# Patient Record
Sex: Male | Born: 1960 | Race: White | Hispanic: No | Marital: Single | Smoking: Former smoker
Health system: Southern US, Community
[De-identification: ages and names within clinical notes are randomized; demographics above are authoritative.]

## PROBLEM LIST (undated history)

## (undated) DIAGNOSIS — B20 Human immunodeficiency virus [HIV] disease: Secondary | ICD-10-CM

## (undated) DIAGNOSIS — Z8619 Personal history of other infectious and parasitic diseases: Secondary | ICD-10-CM

## (undated) DIAGNOSIS — B029 Zoster without complications: Secondary | ICD-10-CM

## (undated) DIAGNOSIS — M79609 Pain in unspecified limb: Secondary | ICD-10-CM

## (undated) DIAGNOSIS — Z862 Personal history of diseases of the blood and blood-forming organs and certain disorders involving the immune mechanism: Secondary | ICD-10-CM

## (undated) DIAGNOSIS — Z205 Contact with and (suspected) exposure to viral hepatitis: Secondary | ICD-10-CM

## (undated) DIAGNOSIS — Z8739 Personal history of other diseases of the musculoskeletal system and connective tissue: Secondary | ICD-10-CM

## (undated) DIAGNOSIS — L89159 Pressure ulcer of sacral region, unspecified stage: Secondary | ICD-10-CM

## (undated) DIAGNOSIS — IMO0001 Reserved for inherently not codable concepts without codable children: Secondary | ICD-10-CM

## (undated) DIAGNOSIS — Z21 Asymptomatic human immunodeficiency virus [HIV] infection status: Secondary | ICD-10-CM

## (undated) DIAGNOSIS — Z9289 Personal history of other medical treatment: Secondary | ICD-10-CM

## (undated) DIAGNOSIS — R112 Nausea with vomiting, unspecified: Secondary | ICD-10-CM

## (undated) DIAGNOSIS — Z9889 Other specified postprocedural states: Secondary | ICD-10-CM

## (undated) DIAGNOSIS — M1990 Unspecified osteoarthritis, unspecified site: Secondary | ICD-10-CM

## (undated) DIAGNOSIS — Z8679 Personal history of other diseases of the circulatory system: Secondary | ICD-10-CM

## (undated) DIAGNOSIS — C20 Malignant neoplasm of rectum: Secondary | ICD-10-CM

## (undated) DIAGNOSIS — Z87898 Personal history of other specified conditions: Secondary | ICD-10-CM

## (undated) DIAGNOSIS — IMO0002 Reserved for concepts with insufficient information to code with codable children: Secondary | ICD-10-CM

## (undated) HISTORY — DX: Malignant neoplasm of rectum: C20

## (undated) HISTORY — PX: HEMATOMA EVACUATION: SHX5118

## (undated) HISTORY — DX: Pain in unspecified limb: M79.609

## (undated) HISTORY — PX: HERNIA REPAIR: SHX51

## (undated) HISTORY — PX: COLONOSCOPY: SHX174

## (undated) HISTORY — PX: CYSTOSCOPY: SUR368

---

## 2002-11-19 ENCOUNTER — Encounter: Payer: Self-pay | Admitting: Infectious Diseases

## 2002-11-19 ENCOUNTER — Encounter: Admission: RE | Admit: 2002-11-19 | Discharge: 2002-11-19 | Payer: Self-pay | Admitting: Infectious Diseases

## 2002-11-19 ENCOUNTER — Ambulatory Visit (HOSPITAL_COMMUNITY): Admission: RE | Admit: 2002-11-19 | Discharge: 2002-11-19 | Payer: Self-pay | Admitting: Infectious Diseases

## 2002-12-03 ENCOUNTER — Encounter: Admission: RE | Admit: 2002-12-03 | Discharge: 2002-12-03 | Payer: Self-pay | Admitting: Infectious Diseases

## 2003-01-15 ENCOUNTER — Encounter: Admission: RE | Admit: 2003-01-15 | Discharge: 2003-01-15 | Payer: Self-pay | Admitting: Infectious Diseases

## 2003-01-15 ENCOUNTER — Encounter (INDEPENDENT_AMBULATORY_CARE_PROVIDER_SITE_OTHER): Payer: Self-pay | Admitting: *Deleted

## 2003-01-15 LAB — CONVERTED CEMR LAB: CD4 Count: 110 microliters

## 2003-05-13 ENCOUNTER — Ambulatory Visit (HOSPITAL_COMMUNITY): Admission: RE | Admit: 2003-05-13 | Discharge: 2003-05-13 | Payer: Self-pay | Admitting: Infectious Diseases

## 2003-05-13 ENCOUNTER — Encounter: Admission: RE | Admit: 2003-05-13 | Discharge: 2003-05-13 | Payer: Self-pay | Admitting: Infectious Diseases

## 2003-05-27 ENCOUNTER — Encounter: Admission: RE | Admit: 2003-05-27 | Discharge: 2003-05-27 | Payer: Self-pay | Admitting: Infectious Diseases

## 2005-08-09 ENCOUNTER — Ambulatory Visit: Payer: Self-pay | Admitting: Infectious Diseases

## 2005-08-09 ENCOUNTER — Encounter (INDEPENDENT_AMBULATORY_CARE_PROVIDER_SITE_OTHER): Payer: Self-pay | Admitting: *Deleted

## 2005-08-09 ENCOUNTER — Encounter: Admission: RE | Admit: 2005-08-09 | Discharge: 2005-08-09 | Payer: Self-pay | Admitting: Infectious Diseases

## 2005-08-09 LAB — CONVERTED CEMR LAB: CD4 Count: 30 microliters

## 2005-08-18 ENCOUNTER — Ambulatory Visit: Payer: Self-pay | Admitting: Infectious Diseases

## 2005-08-23 ENCOUNTER — Ambulatory Visit: Payer: Self-pay | Admitting: Infectious Diseases

## 2005-10-04 ENCOUNTER — Ambulatory Visit: Payer: Self-pay | Admitting: Infectious Diseases

## 2005-10-04 ENCOUNTER — Encounter (INDEPENDENT_AMBULATORY_CARE_PROVIDER_SITE_OTHER): Payer: Self-pay | Admitting: *Deleted

## 2005-10-04 ENCOUNTER — Encounter: Admission: RE | Admit: 2005-10-04 | Discharge: 2005-10-04 | Payer: Self-pay | Admitting: Infectious Diseases

## 2005-10-04 LAB — CONVERTED CEMR LAB
CD4 Count: 120 microliters
HIV 1 RNA Quant: 1360 copies/mL

## 2005-10-18 ENCOUNTER — Encounter (INDEPENDENT_AMBULATORY_CARE_PROVIDER_SITE_OTHER): Payer: Self-pay | Admitting: *Deleted

## 2005-10-18 ENCOUNTER — Encounter: Admission: RE | Admit: 2005-10-18 | Discharge: 2005-10-18 | Payer: Self-pay | Admitting: Infectious Diseases

## 2005-10-18 ENCOUNTER — Ambulatory Visit: Payer: Self-pay | Admitting: Infectious Diseases

## 2005-10-18 LAB — CONVERTED CEMR LAB
Albumin: 3.4 g/dL — ABNORMAL LOW (ref 3.5–5.2)
Alkaline Phosphatase: 64 units/L (ref 39–117)
BUN: 26 mg/dL — ABNORMAL HIGH (ref 6–23)
CO2: 21 meq/L (ref 19–32)
Calcium: 9.1 mg/dL (ref 8.4–10.5)
Chloride: 111 meq/L (ref 96–112)
Glucose, Bld: 76 mg/dL (ref 70–99)
HIV 1 RNA Quant: 135000 copies/mL — ABNORMAL HIGH (ref <50)
Hemoglobin: 8.9 g/dL — ABNORMAL LOW (ref 12.0–15.0)
Leukocyte count, blood: 4.4 10*9/L (ref 4.0–10.5)
Potassium: 4.5 meq/L (ref 3.5–5.3)
RBC Folate: 851 ng/mL — ABNORMAL HIGH (ref 180–600)
RBC: 2.56 M/uL — ABNORMAL LOW (ref 3.87–5.11)
Saturation Ratios: 23 % (ref 20–55)
Sodium: 139 meq/L (ref 135–145)
TIBC: 289 ug/dL (ref 250–470)
Total Protein: 7.9 g/dL (ref 6.0–8.3)
UIBC: 222 ug/dL

## 2005-10-19 DIAGNOSIS — B081 Molluscum contagiosum: Secondary | ICD-10-CM

## 2005-10-19 DIAGNOSIS — M109 Gout, unspecified: Secondary | ICD-10-CM

## 2005-10-19 DIAGNOSIS — B20 Human immunodeficiency virus [HIV] disease: Secondary | ICD-10-CM | POA: Insufficient documentation

## 2005-10-19 DIAGNOSIS — Z87898 Personal history of other specified conditions: Secondary | ICD-10-CM

## 2005-10-19 DIAGNOSIS — Z8619 Personal history of other infectious and parasitic diseases: Secondary | ICD-10-CM

## 2006-01-09 DIAGNOSIS — I82409 Acute embolism and thrombosis of unspecified deep veins of unspecified lower extremity: Secondary | ICD-10-CM

## 2006-01-09 HISTORY — DX: Acute embolism and thrombosis of unspecified deep veins of unspecified lower extremity: I82.409

## 2006-02-05 ENCOUNTER — Encounter: Payer: Self-pay | Admitting: Infectious Diseases

## 2006-02-15 ENCOUNTER — Encounter: Admission: RE | Admit: 2006-02-15 | Discharge: 2006-02-15 | Payer: Self-pay | Admitting: Infectious Diseases

## 2006-02-15 ENCOUNTER — Ambulatory Visit: Payer: Self-pay | Admitting: Infectious Diseases

## 2006-02-15 ENCOUNTER — Encounter (INDEPENDENT_AMBULATORY_CARE_PROVIDER_SITE_OTHER): Payer: Self-pay | Admitting: *Deleted

## 2006-02-15 LAB — CONVERTED CEMR LAB: HIV 1 RNA Quant: 38500 copies/mL

## 2006-02-16 DIAGNOSIS — B370 Candidal stomatitis: Secondary | ICD-10-CM | POA: Insufficient documentation

## 2006-02-16 DIAGNOSIS — F17200 Nicotine dependence, unspecified, uncomplicated: Secondary | ICD-10-CM

## 2006-02-16 DIAGNOSIS — R197 Diarrhea, unspecified: Secondary | ICD-10-CM

## 2006-03-05 ENCOUNTER — Encounter (INDEPENDENT_AMBULATORY_CARE_PROVIDER_SITE_OTHER): Payer: Self-pay | Admitting: *Deleted

## 2006-03-18 ENCOUNTER — Encounter (INDEPENDENT_AMBULATORY_CARE_PROVIDER_SITE_OTHER): Payer: Self-pay | Admitting: *Deleted

## 2006-03-22 ENCOUNTER — Telehealth (INDEPENDENT_AMBULATORY_CARE_PROVIDER_SITE_OTHER): Payer: Self-pay | Admitting: Infectious Diseases

## 2006-04-02 ENCOUNTER — Telehealth: Payer: Self-pay | Admitting: Infectious Diseases

## 2006-04-11 ENCOUNTER — Ambulatory Visit: Payer: Self-pay | Admitting: Internal Medicine

## 2006-04-11 DIAGNOSIS — K612 Anorectal abscess: Secondary | ICD-10-CM | POA: Insufficient documentation

## 2006-04-13 ENCOUNTER — Encounter: Payer: Self-pay | Admitting: Infectious Diseases

## 2006-04-27 ENCOUNTER — Ambulatory Visit (HOSPITAL_COMMUNITY): Admission: RE | Admit: 2006-04-27 | Discharge: 2006-04-27 | Payer: Self-pay | Admitting: General Surgery

## 2006-04-27 ENCOUNTER — Encounter (INDEPENDENT_AMBULATORY_CARE_PROVIDER_SITE_OTHER): Payer: Self-pay | Admitting: Specialist

## 2006-05-01 ENCOUNTER — Telehealth: Payer: Self-pay | Admitting: Infectious Diseases

## 2006-05-03 ENCOUNTER — Ambulatory Visit: Payer: Self-pay | Admitting: Oncology

## 2006-05-07 ENCOUNTER — Ambulatory Visit: Admission: RE | Admit: 2006-05-07 | Discharge: 2006-07-17 | Payer: Self-pay | Admitting: *Deleted

## 2006-05-07 ENCOUNTER — Encounter: Payer: Self-pay | Admitting: Infectious Diseases

## 2006-05-08 ENCOUNTER — Encounter: Payer: Self-pay | Admitting: Infectious Diseases

## 2006-05-08 LAB — CBC & DIFF AND RETIC
Basophils Absolute: 0 10*3/uL (ref 0.0–0.1)
Eosinophils Absolute: 0.1 10*3/uL (ref 0.0–0.5)
HCT: 22.4 % — ABNORMAL LOW (ref 38.7–49.9)
HGB: 7.8 g/dL — ABNORMAL LOW (ref 13.0–17.1)
LYMPH%: 12 % — ABNORMAL LOW (ref 14.0–48.0)
MCV: 86.4 fL (ref 81.6–98.0)
MONO%: 7.4 % (ref 0.0–13.0)
NEUT#: 4.8 10*3/uL (ref 1.5–6.5)
Platelets: 314 10*3/uL (ref 145–400)

## 2006-05-09 LAB — COMPREHENSIVE METABOLIC PANEL
AST: 17 U/L (ref 0–37)
Albumin: 3.3 g/dL — ABNORMAL LOW (ref 3.5–5.2)
BUN: 23 mg/dL (ref 6–23)
Calcium: 8.9 mg/dL (ref 8.4–10.5)
Chloride: 103 mEq/L (ref 96–112)
Glucose, Bld: 89 mg/dL (ref 70–99)
Potassium: 4.2 mEq/L (ref 3.5–5.3)
Total Protein: 7.9 g/dL (ref 6.0–8.3)

## 2006-05-09 LAB — VITAMIN B12: Vitamin B-12: 360 pg/mL (ref 211–911)

## 2006-05-10 ENCOUNTER — Telehealth: Payer: Self-pay | Admitting: Infectious Diseases

## 2006-05-14 ENCOUNTER — Ambulatory Visit (HOSPITAL_COMMUNITY): Admission: RE | Admit: 2006-05-14 | Discharge: 2006-05-14 | Payer: Self-pay | Admitting: Oncology

## 2006-05-14 DIAGNOSIS — C210 Malignant neoplasm of anus, unspecified: Secondary | ICD-10-CM | POA: Insufficient documentation

## 2006-05-14 LAB — CBC WITH DIFFERENTIAL/PLATELET
BASO%: 0.3 % (ref 0.0–2.0)
Basophils Absolute: 0 10*3/uL (ref 0.0–0.1)
Eosinophils Absolute: 0.2 10*3/uL (ref 0.0–0.5)
HCT: 21.9 % — ABNORMAL LOW (ref 38.7–49.9)
HGB: 7.2 g/dL — ABNORMAL LOW (ref 13.0–17.1)
MONO#: 0.7 10*3/uL (ref 0.1–0.9)
NEUT#: 6 10*3/uL (ref 1.5–6.5)
NEUT%: 75.7 % — ABNORMAL HIGH (ref 40.0–75.0)
WBC: 7.9 10*3/uL (ref 4.0–10.0)
lymph#: 1 10*3/uL (ref 0.9–3.3)

## 2006-05-15 ENCOUNTER — Telehealth: Payer: Self-pay | Admitting: Infectious Diseases

## 2006-05-18 LAB — CBC WITH DIFFERENTIAL/PLATELET
Basophils Absolute: 0 10*3/uL (ref 0.0–0.1)
EOS%: 2.5 % (ref 0.0–7.0)
HCT: 22 % — ABNORMAL LOW (ref 38.7–49.9)
HGB: 7.6 g/dL — ABNORMAL LOW (ref 13.0–17.1)
MCH: 29.9 pg (ref 28.0–33.4)
MCV: 86.9 fL (ref 81.6–98.0)
NEUT%: 91.5 % — ABNORMAL HIGH (ref 40.0–75.0)
Platelets: 250 10*3/uL (ref 145–400)
lymph#: 0.2 10*3/uL — ABNORMAL LOW (ref 0.9–3.3)

## 2006-05-21 ENCOUNTER — Encounter: Payer: Self-pay | Admitting: Infectious Diseases

## 2006-05-21 LAB — CBC WITH DIFFERENTIAL/PLATELET
Basophils Absolute: 0 10*3/uL (ref 0.0–0.1)
Eosinophils Absolute: 0.1 10*3/uL (ref 0.0–0.5)
HCT: 21.4 % — ABNORMAL LOW (ref 38.7–49.9)
HGB: 7.3 g/dL — ABNORMAL LOW (ref 13.0–17.1)
LYMPH%: 6.3 % — ABNORMAL LOW (ref 14.0–48.0)
MCV: 86.7 fL (ref 81.6–98.0)
MONO#: 0.1 10*3/uL (ref 0.1–0.9)
MONO%: 2.8 % (ref 0.0–13.0)
NEUT#: 2.7 10*3/uL (ref 1.5–6.5)
NEUT%: 86.2 % — ABNORMAL HIGH (ref 40.0–75.0)
Platelets: 223 10*3/uL (ref 145–400)
RBC: 2.47 10*6/uL — ABNORMAL LOW (ref 4.20–5.71)
WBC: 3.2 10*3/uL — ABNORMAL LOW (ref 4.0–10.0)

## 2006-05-21 LAB — CHCC SMEAR

## 2006-05-23 ENCOUNTER — Encounter: Admission: RE | Admit: 2006-05-23 | Discharge: 2006-05-23 | Payer: Self-pay | Admitting: Infectious Diseases

## 2006-05-23 ENCOUNTER — Ambulatory Visit: Payer: Self-pay | Admitting: Infectious Diseases

## 2006-05-23 LAB — CONVERTED CEMR LAB
AST: 18 units/L (ref 0–37)
Alkaline Phosphatase: 80 units/L (ref 39–117)
BUN: 21 mg/dL (ref 6–23)
CD4 Count: 20 microliters
Calcium: 8.4 mg/dL (ref 8.4–10.5)
Chloride: 105 meq/L (ref 96–112)
Creatinine, Ser: 0.96 mg/dL (ref 0.40–1.50)
HCT: 23.8 % — ABNORMAL LOW (ref 39.0–52.0)
Hemoglobin: 7.1 g/dL — CL (ref 13.0–17.0)
MCHC: 29.8 g/dL — ABNORMAL LOW (ref 30.0–36.0)
MCV: 94.1 fL (ref 78.0–100.0)
RDW: 16.4 % — ABNORMAL HIGH (ref 11.5–14.0)
Total Bilirubin: 0.2 mg/dL — ABNORMAL LOW (ref 0.3–1.2)

## 2006-05-25 ENCOUNTER — Telehealth: Payer: Self-pay | Admitting: Infectious Diseases

## 2006-05-28 LAB — CBC WITH DIFFERENTIAL/PLATELET
Basophils Absolute: 0 10*3/uL (ref 0.0–0.1)
EOS%: 16.7 % — ABNORMAL HIGH (ref 0.0–7.0)
HCT: 22.4 % — ABNORMAL LOW (ref 38.7–49.9)
HGB: 7.7 g/dL — ABNORMAL LOW (ref 13.0–17.1)
LYMPH%: 12.7 % — ABNORMAL LOW (ref 14.0–48.0)
MCH: 30.5 pg (ref 28.0–33.4)
NEUT%: 55.4 % (ref 40.0–75.0)
Platelets: 120 10*3/uL — ABNORMAL LOW (ref 145–400)
lymph#: 0.2 10*3/uL — ABNORMAL LOW (ref 0.9–3.3)

## 2006-06-01 ENCOUNTER — Encounter: Payer: Self-pay | Admitting: Infectious Diseases

## 2006-06-01 LAB — CBC WITH DIFFERENTIAL/PLATELET
BASO%: 0.8 % (ref 0.0–2.0)
Basophils Absolute: 0 10*3/uL (ref 0.0–0.1)
EOS%: 14.7 % — ABNORMAL HIGH (ref 0.0–7.0)
HCT: 26.1 % — ABNORMAL LOW (ref 38.7–49.9)
HGB: 8.5 g/dL — ABNORMAL LOW (ref 13.0–17.1)
MCH: 29.7 pg (ref 28.0–33.4)
MCHC: 32.5 g/dL (ref 32.0–35.9)
MCV: 91.4 fL (ref 81.6–98.0)
MONO%: 17.9 % — ABNORMAL HIGH (ref 0.0–13.0)
NEUT%: 50.5 % (ref 40.0–75.0)

## 2006-06-01 LAB — HOLD TUBE, BLOOD BANK

## 2006-06-06 LAB — CBC WITH DIFFERENTIAL/PLATELET
BASO%: 1.3 % (ref 0.0–2.0)
EOS%: 7 % (ref 0.0–7.0)
HCT: 24 % — ABNORMAL LOW (ref 38.7–49.9)
MCH: 30.3 pg (ref 28.0–33.4)
MCHC: 33.7 g/dL (ref 32.0–35.9)
NEUT%: 65.5 % (ref 40.0–75.0)
RBC: 2.66 10*6/uL — ABNORMAL LOW (ref 4.20–5.71)
WBC: 2.9 10*3/uL — ABNORMAL LOW (ref 4.0–10.0)
lymph#: 0.4 10*3/uL — ABNORMAL LOW (ref 0.9–3.3)

## 2006-06-08 ENCOUNTER — Telehealth: Payer: Self-pay | Admitting: Infectious Diseases

## 2006-06-08 LAB — CBC WITH DIFFERENTIAL/PLATELET
BASO%: 0.5 % (ref 0.0–2.0)
EOS%: 5.2 % (ref 0.0–7.0)
HGB: 8.2 g/dL — ABNORMAL LOW (ref 13.0–17.1)
MCH: 31.8 pg (ref 28.0–33.4)
MCHC: 35 g/dL (ref 32.0–35.9)
MONO#: 0.2 10*3/uL (ref 0.1–0.9)
RDW: 19.9 % — ABNORMAL HIGH (ref 11.2–14.6)
WBC: 2.9 10*3/uL — ABNORMAL LOW (ref 4.0–10.0)
lymph#: 0.2 10*3/uL — ABNORMAL LOW (ref 0.9–3.3)

## 2006-06-13 LAB — CBC WITH DIFFERENTIAL/PLATELET
Basophils Absolute: 0 10*3/uL (ref 0.0–0.1)
Eosinophils Absolute: 0.2 10*3/uL (ref 0.0–0.5)
HGB: 8.5 g/dL — ABNORMAL LOW (ref 13.0–17.1)
MONO#: 0.4 10*3/uL (ref 0.1–0.9)
NEUT#: 1.3 10*3/uL — ABNORMAL LOW (ref 1.5–6.5)
RDW: 22.8 % — ABNORMAL HIGH (ref 11.2–14.6)
lymph#: 0.3 10*3/uL — ABNORMAL LOW (ref 0.9–3.3)

## 2006-06-14 ENCOUNTER — Ambulatory Visit: Payer: Self-pay | Admitting: Oncology

## 2006-06-20 LAB — COMPREHENSIVE METABOLIC PANEL
Albumin: 3.9 g/dL (ref 3.5–5.2)
BUN: 13 mg/dL (ref 6–23)
CO2: 21 mEq/L (ref 19–32)
Calcium: 9 mg/dL (ref 8.4–10.5)
Chloride: 106 mEq/L (ref 96–112)
Glucose, Bld: 91 mg/dL (ref 70–99)
Potassium: 4.3 mEq/L (ref 3.5–5.3)
Sodium: 136 mEq/L (ref 135–145)
Total Protein: 7.5 g/dL (ref 6.0–8.3)

## 2006-06-20 LAB — CBC WITH DIFFERENTIAL/PLATELET
Basophils Absolute: 0 10*3/uL (ref 0.0–0.1)
Eosinophils Absolute: 0.2 10*3/uL (ref 0.0–0.5)
HCT: 26 % — ABNORMAL LOW (ref 38.7–49.9)
HGB: 8.9 g/dL — ABNORMAL LOW (ref 13.0–17.1)
LYMPH%: 14.7 % (ref 14.0–48.0)
MCV: 97.6 fL (ref 81.6–98.0)
MONO%: 17.9 % — ABNORMAL HIGH (ref 0.0–13.0)
NEUT#: 1.7 10*3/uL (ref 1.5–6.5)
Platelets: 280 10*3/uL (ref 145–400)
RDW: 29.7 % — ABNORMAL HIGH (ref 11.2–14.6)

## 2006-06-22 ENCOUNTER — Ambulatory Visit: Payer: Self-pay | Admitting: Infectious Diseases

## 2006-06-25 LAB — CBC WITH DIFFERENTIAL/PLATELET
Basophils Absolute: 0 10*3/uL (ref 0.0–0.1)
Eosinophils Absolute: 0.1 10*3/uL (ref 0.0–0.5)
HGB: 9.2 g/dL — ABNORMAL LOW (ref 13.0–17.1)
MCV: 96.3 fL (ref 81.6–98.0)
MONO%: 12.6 % (ref 0.0–13.0)
NEUT#: 2.1 10*3/uL (ref 1.5–6.5)
RBC: 2.73 10*6/uL — ABNORMAL LOW (ref 4.20–5.71)
RDW: 21.9 % — ABNORMAL HIGH (ref 11.2–14.6)
WBC: 3.7 10*3/uL — ABNORMAL LOW (ref 4.0–10.0)
lymph#: 1 10*3/uL (ref 0.9–3.3)

## 2006-06-26 ENCOUNTER — Telehealth: Payer: Self-pay | Admitting: Infectious Diseases

## 2006-07-02 ENCOUNTER — Encounter: Payer: Self-pay | Admitting: Infectious Diseases

## 2006-07-02 LAB — CBC WITH DIFFERENTIAL/PLATELET
Basophils Absolute: 0 10*3/uL (ref 0.0–0.1)
Eosinophils Absolute: 0 10*3/uL (ref 0.0–0.5)
HGB: 9.8 g/dL — ABNORMAL LOW (ref 13.0–17.1)
LYMPH%: 8.4 % — ABNORMAL LOW (ref 14.0–48.0)
MCV: 99.9 fL — ABNORMAL HIGH (ref 81.6–98.0)
MONO#: 0.1 10*3/uL (ref 0.1–0.9)
NEUT#: 2.4 10*3/uL (ref 1.5–6.5)
Platelets: 155 10*3/uL (ref 145–400)
RBC: 2.81 10*6/uL — ABNORMAL LOW (ref 4.20–5.71)
RDW: 28.2 % — ABNORMAL HIGH (ref 11.2–14.6)
WBC: 2.8 10*3/uL — ABNORMAL LOW (ref 4.0–10.0)

## 2006-07-09 LAB — CBC WITH DIFFERENTIAL/PLATELET
Basophils Absolute: 0 10*3/uL (ref 0.0–0.1)
Eosinophils Absolute: 0.1 10*3/uL (ref 0.0–0.5)
HCT: 27.8 % — ABNORMAL LOW (ref 38.7–49.9)
LYMPH%: 15.2 % (ref 14.0–48.0)
MCV: 101 fL — ABNORMAL HIGH (ref 81.6–98.0)
MONO#: 0.1 10*3/uL (ref 0.1–0.9)
MONO%: 7.1 % (ref 0.0–13.0)
NEUT#: 1.4 10*3/uL — ABNORMAL LOW (ref 1.5–6.5)
NEUT%: 71.9 % (ref 40.0–75.0)
Platelets: 63 10*3/uL — ABNORMAL LOW (ref 145–400)
WBC: 1.9 10*3/uL — ABNORMAL LOW (ref 4.0–10.0)

## 2006-07-10 ENCOUNTER — Encounter: Payer: Self-pay | Admitting: Infectious Diseases

## 2006-07-11 ENCOUNTER — Telehealth: Payer: Self-pay | Admitting: Infectious Diseases

## 2006-07-11 LAB — CBC WITH DIFFERENTIAL/PLATELET
Basophils Absolute: 0 10*3/uL (ref 0.0–0.1)
Eosinophils Absolute: 0.1 10*3/uL (ref 0.0–0.5)
HGB: 8.6 g/dL — ABNORMAL LOW (ref 13.0–17.1)
NEUT#: 0.2 10*3/uL — CL (ref 1.5–6.5)
RBC: 2.4 10*6/uL — ABNORMAL LOW (ref 4.20–5.71)
RDW: 28.6 % — ABNORMAL HIGH (ref 11.2–14.6)
WBC: 0.8 10*3/uL — CL (ref 4.0–10.0)
lymph#: 0.2 10*3/uL — ABNORMAL LOW (ref 0.9–3.3)

## 2006-07-11 LAB — TECHNOLOGIST REVIEW

## 2006-07-16 LAB — CBC WITH DIFFERENTIAL/PLATELET
Basophils Absolute: 0 10*3/uL (ref 0.0–0.1)
Eosinophils Absolute: 0.2 10*3/uL (ref 0.0–0.5)
HGB: 9.2 g/dL — ABNORMAL LOW (ref 13.0–17.1)
NEUT#: 1.3 10*3/uL — ABNORMAL LOW (ref 1.5–6.5)
RDW: 29 % — ABNORMAL HIGH (ref 11.2–14.6)
WBC: 2.1 10*3/uL — ABNORMAL LOW (ref 4.0–10.0)
lymph#: 0.3 10*3/uL — ABNORMAL LOW (ref 0.9–3.3)

## 2006-07-20 LAB — CBC WITH DIFFERENTIAL/PLATELET
Basophils Absolute: 0 10*3/uL (ref 0.0–0.1)
Eosinophils Absolute: 0.1 10*3/uL (ref 0.0–0.5)
HGB: 9.3 g/dL — ABNORMAL LOW (ref 13.0–17.1)
LYMPH%: 14.5 % (ref 14.0–48.0)
MCV: 104 fL — ABNORMAL HIGH (ref 81.6–98.0)
MONO#: 0.3 10*3/uL (ref 0.1–0.9)
MONO%: 12.8 % (ref 0.0–13.0)
NEUT#: 1.6 10*3/uL (ref 1.5–6.5)
Platelets: 132 10*3/uL — ABNORMAL LOW (ref 145–400)
RDW: 28.7 % — ABNORMAL HIGH (ref 11.2–14.6)
WBC: 2.3 10*3/uL — ABNORMAL LOW (ref 4.0–10.0)

## 2006-07-23 ENCOUNTER — Encounter: Payer: Self-pay | Admitting: Infectious Diseases

## 2006-07-23 LAB — CBC WITH DIFFERENTIAL/PLATELET
Eosinophils Absolute: 0.1 10*3/uL (ref 0.0–0.5)
HCT: 28.4 % — ABNORMAL LOW (ref 38.7–49.9)
LYMPH%: 14.8 % (ref 14.0–48.0)
MCV: 105.8 fL — ABNORMAL HIGH (ref 81.6–98.0)
MONO%: 10.4 % (ref 0.0–13.0)
NEUT#: 2.4 10*3/uL (ref 1.5–6.5)
NEUT%: 72.9 % (ref 40.0–75.0)
Platelets: 137 10*3/uL — ABNORMAL LOW (ref 145–400)
RBC: 2.69 10*6/uL — ABNORMAL LOW (ref 4.20–5.71)

## 2006-07-26 ENCOUNTER — Telehealth: Payer: Self-pay | Admitting: Infectious Diseases

## 2006-07-27 LAB — CBC WITH DIFFERENTIAL/PLATELET
BASO%: 0.4 % (ref 0.0–2.0)
EOS%: 4.4 % (ref 0.0–7.0)
HCT: 29.3 % — ABNORMAL LOW (ref 38.7–49.9)
LYMPH%: 17 % (ref 14.0–48.0)
MCH: 37.8 pg — ABNORMAL HIGH (ref 28.0–33.4)
MCHC: 35.2 g/dL (ref 32.0–35.9)
MCV: 107.4 fL — ABNORMAL HIGH (ref 81.6–98.0)
MONO#: 0.4 10*3/uL (ref 0.1–0.9)
MONO%: 11.6 % (ref 0.0–13.0)
NEUT%: 66.6 % (ref 40.0–75.0)
Platelets: 157 10*3/uL (ref 145–400)
RBC: 2.73 10*6/uL — ABNORMAL LOW (ref 4.20–5.71)
WBC: 3.4 10*3/uL — ABNORMAL LOW (ref 4.0–10.0)

## 2006-08-06 ENCOUNTER — Encounter (INDEPENDENT_AMBULATORY_CARE_PROVIDER_SITE_OTHER): Payer: Self-pay | Admitting: *Deleted

## 2006-08-06 ENCOUNTER — Ambulatory Visit: Payer: Self-pay | Admitting: Infectious Diseases

## 2006-08-06 ENCOUNTER — Encounter: Admission: RE | Admit: 2006-08-06 | Discharge: 2006-08-06 | Payer: Self-pay | Admitting: Infectious Diseases

## 2006-08-06 LAB — CONVERTED CEMR LAB
Albumin: 3.9 g/dL (ref 3.5–5.2)
CO2: 23 meq/L (ref 19–32)
Eosinophils Relative: 6 % — ABNORMAL HIGH (ref 0–5)
Glucose, Bld: 81 mg/dL (ref 70–99)
HCT: 35.4 % — ABNORMAL LOW (ref 39.0–52.0)
HIV 1 RNA Quant: 147 copies/mL — ABNORMAL HIGH (ref <50)
HIV-1 RNA Quant, Log: 2.17 — ABNORMAL HIGH (ref <1.70)
Lymphocytes Relative: 19 % (ref 12–46)
Lymphs Abs: 0.8 10*3/uL (ref 0.7–3.3)
Platelets: 147 10*3/uL — ABNORMAL LOW (ref 150–400)
Potassium: 4.3 meq/L (ref 3.5–5.3)
RBC: 3.12 M/uL — ABNORMAL LOW (ref 4.22–5.81)
Sodium: 141 meq/L (ref 135–145)
Total Bilirubin: 0.3 mg/dL (ref 0.3–1.2)
Total Protein: 7 g/dL (ref 6.0–8.3)
WBC: 4.3 10*3/uL (ref 4.0–10.5)

## 2006-08-08 ENCOUNTER — Ambulatory Visit: Payer: Self-pay | Admitting: Oncology

## 2006-08-10 ENCOUNTER — Encounter: Payer: Self-pay | Admitting: Infectious Diseases

## 2006-08-10 LAB — CBC WITH DIFFERENTIAL/PLATELET
Basophils Absolute: 0 10*3/uL (ref 0.0–0.1)
Eosinophils Absolute: 0.3 10*3/uL (ref 0.0–0.5)
HGB: 11.1 g/dL — ABNORMAL LOW (ref 13.0–17.1)
LYMPH%: 17.9 % (ref 14.0–48.0)
MCH: 38.8 pg — ABNORMAL HIGH (ref 28.0–33.4)
MCV: 111.6 fL — ABNORMAL HIGH (ref 81.6–98.0)
MONO%: 8.5 % (ref 0.0–13.0)
NEUT#: 4.4 10*3/uL (ref 1.5–6.5)
Platelets: 201 10*3/uL (ref 145–400)
RBC: 2.87 10*6/uL — ABNORMAL LOW (ref 4.20–5.71)

## 2006-08-20 ENCOUNTER — Ambulatory Visit: Payer: Self-pay | Admitting: Vascular Surgery

## 2006-08-20 ENCOUNTER — Ambulatory Visit: Admission: RE | Admit: 2006-08-20 | Discharge: 2006-08-20 | Payer: Self-pay | Admitting: Oncology

## 2006-08-20 ENCOUNTER — Encounter: Payer: Self-pay | Admitting: Infectious Diseases

## 2006-08-20 ENCOUNTER — Encounter: Payer: Self-pay | Admitting: Oncology

## 2006-08-22 ENCOUNTER — Ambulatory Visit: Payer: Self-pay | Admitting: Infectious Diseases

## 2006-08-22 DIAGNOSIS — I82409 Acute embolism and thrombosis of unspecified deep veins of unspecified lower extremity: Secondary | ICD-10-CM | POA: Insufficient documentation

## 2006-08-24 LAB — CBC WITH DIFFERENTIAL/PLATELET
Basophils Absolute: 0 10*3/uL (ref 0.0–0.1)
Eosinophils Absolute: 0.4 10*3/uL (ref 0.0–0.5)
HGB: 10.3 g/dL — ABNORMAL LOW (ref 13.0–17.1)
LYMPH%: 19.7 % (ref 14.0–48.0)
MCV: 112.8 fL — ABNORMAL HIGH (ref 81.6–98.0)
MONO%: 7.1 % (ref 0.0–13.0)
NEUT#: 3.2 10*3/uL (ref 1.5–6.5)
NEUT%: 64.6 % (ref 40.0–75.0)
Platelets: 203 10*3/uL (ref 145–400)

## 2006-08-27 ENCOUNTER — Telehealth: Payer: Self-pay | Admitting: Infectious Diseases

## 2006-08-27 LAB — PTT FACTOR INHIBITOR (MIXING STUDY): PTT: 34 seconds (ref 24–37)

## 2006-08-27 LAB — LUPUS ANTICOAGULANT PANEL

## 2006-08-27 LAB — PROTIME-INR: Protime: 14.4 Seconds — ABNORMAL HIGH (ref 10.6–13.4)

## 2006-08-30 LAB — PROTIME-INR
INR: 1.4 — ABNORMAL LOW (ref 2.00–3.50)
Protime: 16.8 Seconds — ABNORMAL HIGH (ref 10.6–13.4)

## 2006-08-31 LAB — PROTIME-INR: INR: 1.5 — ABNORMAL LOW (ref 2.00–3.50)

## 2006-09-07 LAB — PROTIME-INR: INR: 3 (ref 2.00–3.50)

## 2006-09-13 LAB — PROTIME-INR: Protime: 25.2 Seconds — ABNORMAL HIGH (ref 10.6–13.4)

## 2006-09-17 ENCOUNTER — Encounter: Payer: Self-pay | Admitting: Infectious Diseases

## 2006-09-17 LAB — PROTIME-INR: Protime: 25.2 Seconds — ABNORMAL HIGH (ref 10.6–13.4)

## 2006-09-17 LAB — CBC WITH DIFFERENTIAL/PLATELET
Eosinophils Absolute: 0.3 10*3/uL (ref 0.0–0.5)
HCT: 27.1 % — ABNORMAL LOW (ref 38.7–49.9)
LYMPH%: 12.5 % — ABNORMAL LOW (ref 14.0–48.0)
MCHC: 36.1 g/dL — ABNORMAL HIGH (ref 32.0–35.9)
MCV: 112 fL — ABNORMAL HIGH (ref 81.6–98.0)
MONO%: 8.6 % (ref 0.0–13.0)
NEUT#: 3.8 10*3/uL (ref 1.5–6.5)
NEUT%: 72.7 % (ref 40.0–75.0)
Platelets: 121 10*3/uL — ABNORMAL LOW (ref 145–400)
RBC: 2.43 10*6/uL — ABNORMAL LOW (ref 4.20–5.71)

## 2006-09-17 LAB — RETICULOCYTES
RETIC #: 38.2 10*3/uL (ref 31.8–103.9)
Retic %: 1.6 % (ref 0.7–2.3)

## 2006-09-24 ENCOUNTER — Telehealth: Payer: Self-pay | Admitting: Infectious Diseases

## 2006-09-27 ENCOUNTER — Ambulatory Visit: Payer: Self-pay | Admitting: Oncology

## 2006-10-01 LAB — CBC WITH DIFFERENTIAL/PLATELET
Basophils Absolute: 0 10*3/uL (ref 0.0–0.1)
Eosinophils Absolute: 0.3 10*3/uL (ref 0.0–0.5)
HGB: 10.1 g/dL — ABNORMAL LOW (ref 13.0–17.1)
MCV: 112 fL — ABNORMAL HIGH (ref 81.6–98.0)
MONO#: 0.5 10*3/uL (ref 0.1–0.9)
MONO%: 8.6 % (ref 0.0–13.0)
NEUT#: 3.6 10*3/uL (ref 1.5–6.5)
RBC: 2.54 10*6/uL — ABNORMAL LOW (ref 4.20–5.71)
RDW: 13.2 % (ref 11.2–14.6)
WBC: 5.5 10*3/uL (ref 4.0–10.0)
lymph#: 1.1 10*3/uL (ref 0.9–3.3)

## 2006-10-01 LAB — PROTIME-INR
INR: 1.7 — ABNORMAL LOW (ref 2.00–3.50)
Protime: 20.4 Seconds — ABNORMAL HIGH (ref 10.6–13.4)

## 2006-10-16 ENCOUNTER — Encounter: Payer: Self-pay | Admitting: Infectious Diseases

## 2006-10-16 LAB — CBC WITH DIFFERENTIAL/PLATELET
Basophils Absolute: 0 10*3/uL (ref 0.0–0.1)
EOS%: 7.9 % — ABNORMAL HIGH (ref 0.0–7.0)
HCT: 29.7 % — ABNORMAL LOW (ref 38.7–49.9)
HGB: 10.6 g/dL — ABNORMAL LOW (ref 13.0–17.1)
LYMPH%: 29.6 % (ref 14.0–48.0)
MCH: 40.7 pg — ABNORMAL HIGH (ref 28.0–33.4)
MCV: 114 fL — ABNORMAL HIGH (ref 81.6–98.0)
MONO%: 10 % (ref 0.0–13.0)
NEUT%: 51.6 % (ref 40.0–75.0)

## 2006-10-16 LAB — PROTIME-INR

## 2006-10-24 ENCOUNTER — Encounter: Admission: RE | Admit: 2006-10-24 | Discharge: 2006-10-24 | Payer: Self-pay | Admitting: Infectious Diseases

## 2006-10-24 ENCOUNTER — Ambulatory Visit: Payer: Self-pay | Admitting: Infectious Diseases

## 2006-10-24 ENCOUNTER — Telehealth: Payer: Self-pay | Admitting: Infectious Diseases

## 2006-10-24 LAB — CONVERTED CEMR LAB
ALT: 10 units/L (ref 0–53)
Basophils Absolute: 0 10*3/uL (ref 0.0–0.1)
CO2: 22 meq/L (ref 19–32)
Calcium: 8.9 mg/dL (ref 8.4–10.5)
Chloride: 109 meq/L (ref 96–112)
HCV Quantitative: <5 intl units/mL (ref <5)
Lymphocytes Relative: 20 % (ref 12–46)
Neutro Abs: 2.2 10*3/uL (ref 1.7–7.7)
Neutrophils Relative %: 64 % (ref 43–77)
Platelets: 149 10*3/uL — ABNORMAL LOW (ref 150–400)
RDW: 14.3 % — ABNORMAL HIGH (ref 11.5–14.0)
Sodium: 141 meq/L (ref 135–145)
Total Bilirubin: 0.3 mg/dL (ref 0.3–1.2)
Total Protein: 6.8 g/dL (ref 6.0–8.3)

## 2006-11-20 ENCOUNTER — Ambulatory Visit: Payer: Self-pay | Admitting: Infectious Diseases

## 2006-11-21 ENCOUNTER — Telehealth: Payer: Self-pay | Admitting: Infectious Diseases

## 2006-11-21 ENCOUNTER — Encounter: Payer: Self-pay | Admitting: Infectious Diseases

## 2006-11-25 ENCOUNTER — Ambulatory Visit: Payer: Self-pay | Admitting: Oncology

## 2006-11-27 ENCOUNTER — Encounter (INDEPENDENT_AMBULATORY_CARE_PROVIDER_SITE_OTHER): Payer: Self-pay | Admitting: General Surgery

## 2006-11-27 ENCOUNTER — Ambulatory Visit (HOSPITAL_COMMUNITY): Admission: RE | Admit: 2006-11-27 | Discharge: 2006-11-27 | Payer: Self-pay | Admitting: General Surgery

## 2006-11-29 ENCOUNTER — Encounter: Payer: Self-pay | Admitting: Infectious Diseases

## 2006-11-29 LAB — CBC WITH DIFFERENTIAL/PLATELET
Basophils Absolute: 0 10*3/uL (ref 0.0–0.1)
EOS%: 5.9 % (ref 0.0–7.0)
HCT: 30.1 % — ABNORMAL LOW (ref 38.7–49.9)
HGB: 10.7 g/dL — ABNORMAL LOW (ref 13.0–17.1)
LYMPH%: 20.1 % (ref 14.0–48.0)
MCH: 40 pg — ABNORMAL HIGH (ref 28.0–33.4)
MCV: 112.4 fL — ABNORMAL HIGH (ref 81.6–98.0)
NEUT%: 64.9 % (ref 40.0–75.0)
Platelets: 145 10*3/uL (ref 145–400)
lymph#: 0.9 10*3/uL (ref 0.9–3.3)

## 2006-11-29 LAB — PROTIME-INR

## 2006-11-30 LAB — PROTIME-INR
INR: 1.4 — ABNORMAL LOW (ref 2.00–3.50)
Protime: 16.8 Seconds — ABNORMAL HIGH (ref 10.6–13.4)

## 2006-12-03 LAB — PROTIME-INR
INR: 2 (ref 2.00–3.50)
Protime: 24 Seconds — ABNORMAL HIGH (ref 10.6–13.4)

## 2006-12-20 ENCOUNTER — Telehealth: Payer: Self-pay | Admitting: Infectious Diseases

## 2006-12-21 LAB — PROTIME-INR

## 2006-12-26 ENCOUNTER — Encounter: Payer: Self-pay | Admitting: Infectious Diseases

## 2006-12-28 LAB — PROTIME-INR: Protime: 32.4 Seconds — ABNORMAL HIGH (ref 10.6–13.4)

## 2007-01-01 ENCOUNTER — Encounter: Payer: Self-pay | Admitting: Infectious Diseases

## 2007-01-01 LAB — CBC WITH DIFFERENTIAL/PLATELET
BASO%: 0.8 % (ref 0.0–2.0)
EOS%: 4.6 % (ref 0.0–7.0)
LYMPH%: 20.7 % (ref 14.0–48.0)
MCH: 38.2 pg — ABNORMAL HIGH (ref 28.0–33.4)
MCHC: 35.7 g/dL (ref 32.0–35.9)
MONO#: 0.5 10*3/uL (ref 0.1–0.9)
Platelets: 98 10*3/uL — ABNORMAL LOW (ref 145–400)
RBC: 2.91 10*6/uL — ABNORMAL LOW (ref 4.20–5.71)
WBC: 4.7 10*3/uL (ref 4.0–10.0)
lymph#: 1 10*3/uL (ref 0.9–3.3)

## 2007-01-07 ENCOUNTER — Ambulatory Visit: Payer: Self-pay | Admitting: Oncology

## 2007-01-07 LAB — COMPREHENSIVE METABOLIC PANEL
Albumin: 3.7 g/dL (ref 3.5–5.2)
Alkaline Phosphatase: 75 U/L (ref 39–117)
Calcium: 9.2 mg/dL (ref 8.4–10.5)
Chloride: 107 mEq/L (ref 96–112)
Glucose, Bld: 135 mg/dL — ABNORMAL HIGH (ref 70–99)
Potassium: 3.9 mEq/L (ref 3.5–5.3)
Sodium: 139 mEq/L (ref 135–145)
Total Protein: 7.4 g/dL (ref 6.0–8.3)

## 2007-01-07 LAB — URINALYSIS, MICROSCOPIC - CHCC
Blood: NEGATIVE
Nitrite: NEGATIVE
Protein: <30 mg/dL
Specific Gravity, Urine: 1.02 (ref 1.003–1.035)
pH: 6 (ref 4.6–8.0)

## 2007-01-07 LAB — CBC WITH DIFFERENTIAL/PLATELET
Basophils Absolute: 0 10*3/uL (ref 0.0–0.1)
Eosinophils Absolute: 0.1 10*3/uL (ref 0.0–0.5)
HCT: 30.6 % — ABNORMAL LOW (ref 38.7–49.9)
LYMPH%: 23.6 % (ref 14.0–48.0)
MCV: 109.3 fL — ABNORMAL HIGH (ref 81.6–98.0)
MONO#: 0.3 10*3/uL (ref 0.1–0.9)
NEUT#: 3 10*3/uL (ref 1.5–6.5)
NEUT%: 65.5 % (ref 40.0–75.0)
Platelets: 107 10*3/uL — ABNORMAL LOW (ref 145–400)
WBC: 4.6 10*3/uL (ref 4.0–10.0)

## 2007-01-07 LAB — PROTHROMBIN TIME
INR: 1.1 (ref 0.0–1.5)
Prothrombin Time: 14.1 seconds (ref 11.6–15.2)

## 2007-01-08 ENCOUNTER — Encounter: Payer: Self-pay | Admitting: Internal Medicine

## 2007-01-08 ENCOUNTER — Ambulatory Visit (HOSPITAL_COMMUNITY): Admission: RE | Admit: 2007-01-08 | Discharge: 2007-01-08 | Payer: Self-pay | Admitting: General Surgery

## 2007-01-08 ENCOUNTER — Encounter (INDEPENDENT_AMBULATORY_CARE_PROVIDER_SITE_OTHER): Payer: Self-pay | Admitting: General Surgery

## 2007-01-09 ENCOUNTER — Encounter (INDEPENDENT_AMBULATORY_CARE_PROVIDER_SITE_OTHER): Payer: Self-pay | Admitting: *Deleted

## 2007-01-11 LAB — CBC WITH DIFFERENTIAL/PLATELET
BASO%: 0.1 % (ref 0.0–2.0)
EOS%: 0 % (ref 0.0–7.0)
LYMPH%: 3.4 % — ABNORMAL LOW (ref 14.0–48.0)
MCH: 36.5 pg — ABNORMAL HIGH (ref 28.0–33.4)
MCHC: 34.3 g/dL (ref 32.0–35.9)
MCV: 106 fL — ABNORMAL HIGH (ref 81.6–98.0)
MONO#: 0.7 10*3/uL (ref 0.1–0.9)
MONO%: 6.1 % (ref 0.0–13.0)
Platelets: 88 10*3/uL — ABNORMAL LOW (ref 145–400)
RBC: 2.53 10*6/uL — ABNORMAL LOW (ref 4.20–5.71)
WBC: 11.2 10*3/uL — ABNORMAL HIGH (ref 4.0–10.0)

## 2007-01-11 LAB — PROTIME-INR: Protime: 18 Seconds — ABNORMAL HIGH (ref 10.6–13.4)

## 2007-01-14 ENCOUNTER — Encounter (HOSPITAL_COMMUNITY): Admission: RE | Admit: 2007-01-14 | Discharge: 2007-04-14 | Payer: Self-pay | Admitting: Oncology

## 2007-01-14 ENCOUNTER — Encounter: Payer: Self-pay | Admitting: Infectious Diseases

## 2007-01-14 LAB — CHCC SMEAR

## 2007-01-14 LAB — CBC WITH DIFFERENTIAL/PLATELET
BASO%: 0.3 % (ref 0.0–2.0)
HCT: 22.6 % — ABNORMAL LOW (ref 38.7–49.9)
LYMPH%: 11.6 % — ABNORMAL LOW (ref 14.0–48.0)
MCH: 35.5 pg — ABNORMAL HIGH (ref 28.0–33.4)
MCHC: 33.9 g/dL (ref 32.0–35.9)
MCV: 105 fL — ABNORMAL HIGH (ref 81.6–98.0)
MONO%: 7.6 % (ref 0.0–13.0)
NEUT%: 79.6 % — ABNORMAL HIGH (ref 40.0–75.0)
Platelets: 115 10*3/uL — ABNORMAL LOW (ref 145–400)
RBC: 2.15 10*6/uL — ABNORMAL LOW (ref 4.20–5.71)

## 2007-01-14 LAB — PROTIME-INR: Protime: 26.4 Seconds — ABNORMAL HIGH (ref 10.6–13.4)

## 2007-01-15 ENCOUNTER — Encounter: Payer: Self-pay | Admitting: Infectious Diseases

## 2007-01-15 LAB — CBC WITH DIFFERENTIAL/PLATELET
EOS%: 1.5 % (ref 0.0–7.0)
Eosinophils Absolute: 0.1 10*3/uL (ref 0.0–0.5)
MCV: 96.2 fL (ref 81.6–98.0)
MONO%: 10.6 % (ref 0.0–13.0)
NEUT#: 4.1 10*3/uL (ref 1.5–6.5)
RBC: 2.59 10*6/uL — ABNORMAL LOW (ref 4.20–5.71)
RDW: 17.5 % — ABNORMAL HIGH (ref 11.2–14.6)
WBC: 5.3 10*3/uL (ref 4.0–10.0)

## 2007-01-15 LAB — PROTIME-INR
INR: 1.5 — ABNORMAL LOW (ref 2.00–3.50)
Protime: 18 Seconds — ABNORMAL HIGH (ref 10.6–13.4)

## 2007-01-17 LAB — TYPE & CROSSMATCH - CHCC

## 2007-01-22 LAB — CBC WITH DIFFERENTIAL/PLATELET
Basophils Absolute: 0 10*3/uL (ref 0.0–0.1)
Eosinophils Absolute: 0.1 10*3/uL (ref 0.0–0.5)
LYMPH%: 12.4 % — ABNORMAL LOW (ref 14.0–48.0)
MCH: 32.7 pg (ref 28.0–33.4)
MCHC: 34.3 g/dL (ref 32.0–35.9)
MCV: 95.4 fL (ref 81.6–98.0)
MONO#: 0.5 10*3/uL (ref 0.1–0.9)
NEUT#: 4.9 10*3/uL (ref 1.5–6.5)
NEUT%: 78.2 % — ABNORMAL HIGH (ref 40.0–75.0)
lymph#: 0.8 10*3/uL — ABNORMAL LOW (ref 0.9–3.3)

## 2007-01-22 LAB — PROTIME-INR

## 2007-01-28 ENCOUNTER — Telehealth: Payer: Self-pay | Admitting: Infectious Diseases

## 2007-02-05 ENCOUNTER — Telehealth: Payer: Self-pay | Admitting: Infectious Diseases

## 2007-02-07 ENCOUNTER — Encounter: Payer: Self-pay | Admitting: Infectious Diseases

## 2007-02-07 LAB — CBC WITH DIFFERENTIAL/PLATELET
Eosinophils Absolute: 0.1 10*3/uL (ref 0.0–0.5)
HCT: 26.7 % — ABNORMAL LOW (ref 38.7–49.9)
LYMPH%: 17.9 % (ref 14.0–48.0)
MCHC: 33.1 g/dL (ref 32.0–35.9)
MCV: 91.3 fL (ref 81.6–98.0)
MONO%: 11.2 % (ref 0.0–13.0)
NEUT#: 3.4 10*3/uL (ref 1.5–6.5)
NEUT%: 67.7 % (ref 40.0–75.0)
Platelets: 111 10*3/uL — ABNORMAL LOW (ref 145–400)
RBC: 2.92 10*6/uL — ABNORMAL LOW (ref 4.20–5.71)

## 2007-02-07 LAB — PROTIME-INR: Protime: 13.2 Seconds (ref 10.6–13.4)

## 2007-02-08 ENCOUNTER — Encounter: Payer: Self-pay | Admitting: Infectious Diseases

## 2007-02-12 ENCOUNTER — Telehealth: Payer: Self-pay | Admitting: Infectious Diseases

## 2007-02-13 ENCOUNTER — Encounter: Payer: Self-pay | Admitting: Infectious Diseases

## 2007-02-14 ENCOUNTER — Telehealth: Payer: Self-pay | Admitting: Infectious Diseases

## 2007-02-14 ENCOUNTER — Ambulatory Visit: Payer: Self-pay | Admitting: Infectious Diseases

## 2007-02-14 ENCOUNTER — Encounter: Admission: RE | Admit: 2007-02-14 | Discharge: 2007-02-14 | Payer: Self-pay | Admitting: Infectious Diseases

## 2007-02-14 DIAGNOSIS — E43 Unspecified severe protein-calorie malnutrition: Secondary | ICD-10-CM | POA: Insufficient documentation

## 2007-02-14 LAB — CONVERTED CEMR LAB
AST: 27 units/L (ref 0–37)
BUN: 28 mg/dL — ABNORMAL HIGH (ref 6–23)
Calcium: 9.9 mg/dL (ref 8.4–10.5)
Chloride: 105 meq/L (ref 96–112)
Cholesterol: 125 mg/dL (ref 0–200)
Creatinine, Ser: 1.13 mg/dL (ref 0.40–1.50)
Glucose, Bld: 112 mg/dL — ABNORMAL HIGH (ref 70–99)
HCT: 28.2 % — ABNORMAL LOW (ref 39.0–52.0)
HDL: 30 mg/dL — ABNORMAL LOW (ref >39)
Hemoglobin: 8.7 g/dL — ABNORMAL LOW (ref 13.0–17.0)
RBC: 2.85 M/uL — ABNORMAL LOW (ref 4.22–5.81)
RDW: 21.5 % — ABNORMAL HIGH (ref 11.5–15.5)
Total CHOL/HDL Ratio: 4.2
Triglycerides: 185 mg/dL — ABNORMAL HIGH (ref <150)

## 2007-02-18 ENCOUNTER — Encounter: Payer: Self-pay | Admitting: Infectious Diseases

## 2007-02-18 LAB — COMPREHENSIVE METABOLIC PANEL
Alkaline Phosphatase: 81 U/L (ref 39–117)
BUN: 26 mg/dL — ABNORMAL HIGH (ref 6–23)
Creatinine, Ser: 1.1 mg/dL (ref 0.40–1.50)
Glucose, Bld: 128 mg/dL — ABNORMAL HIGH (ref 70–99)
Total Bilirubin: 1.7 mg/dL — ABNORMAL HIGH (ref 0.3–1.2)

## 2007-02-18 LAB — CBC WITH DIFFERENTIAL/PLATELET
Basophils Absolute: 0 10*3/uL (ref 0.0–0.1)
Eosinophils Absolute: 0.1 10*3/uL (ref 0.0–0.5)
HGB: 8.4 g/dL — ABNORMAL LOW (ref 13.0–17.1)
LYMPH%: 14.7 % (ref 14.0–48.0)
MCV: 94.4 fL (ref 81.6–98.0)
MONO%: 5.9 % (ref 0.0–13.0)
NEUT#: 5.5 10*3/uL (ref 1.5–6.5)
Platelets: 155 10*3/uL (ref 145–400)
RBC: 2.72 10*6/uL — ABNORMAL LOW (ref 4.20–5.71)

## 2007-02-19 ENCOUNTER — Telehealth: Payer: Self-pay | Admitting: Infectious Diseases

## 2007-02-22 ENCOUNTER — Ambulatory Visit: Payer: Self-pay | Admitting: Oncology

## 2007-02-26 ENCOUNTER — Encounter: Payer: Self-pay | Admitting: Infectious Diseases

## 2007-02-26 LAB — COMPREHENSIVE METABOLIC PANEL
Albumin: 4.3 g/dL (ref 3.5–5.2)
Alkaline Phosphatase: 75 U/L (ref 39–117)
BUN: 22 mg/dL (ref 6–23)
Calcium: 9.9 mg/dL (ref 8.4–10.5)
Glucose, Bld: 118 mg/dL — ABNORMAL HIGH (ref 70–99)
Potassium: 3.9 mEq/L (ref 3.5–5.3)

## 2007-02-26 LAB — CBC WITH DIFFERENTIAL/PLATELET
Basophils Absolute: 0 10*3/uL (ref 0.0–0.1)
EOS%: 2.7 % (ref 0.0–7.0)
HCT: 24.3 % — ABNORMAL LOW (ref 38.7–49.9)
HGB: 8.6 g/dL — ABNORMAL LOW (ref 13.0–17.1)
MCH: 34.9 pg — ABNORMAL HIGH (ref 28.0–33.4)
MCV: 98.1 fL — ABNORMAL HIGH (ref 81.6–98.0)
MONO%: 5.7 % (ref 0.0–13.0)
NEUT%: 77.2 % — ABNORMAL HIGH (ref 40.0–75.0)
Platelets: 143 10*3/uL — ABNORMAL LOW (ref 145–400)
lymph#: 1.3 10*3/uL (ref 0.9–3.3)

## 2007-03-06 ENCOUNTER — Encounter: Payer: Self-pay | Admitting: Infectious Diseases

## 2007-03-06 ENCOUNTER — Telehealth (INDEPENDENT_AMBULATORY_CARE_PROVIDER_SITE_OTHER): Payer: Self-pay | Admitting: *Deleted

## 2007-03-07 LAB — CBC WITH DIFFERENTIAL/PLATELET
Basophils Absolute: 0 10*3/uL (ref 0.0–0.1)
Eosinophils Absolute: 0.2 10*3/uL (ref 0.0–0.5)
HGB: 10.7 g/dL — ABNORMAL LOW (ref 13.0–17.1)
MCV: 97.3 fL (ref 81.6–98.0)
MONO#: 0.4 10*3/uL (ref 0.1–0.9)
NEUT#: 3.9 10*3/uL (ref 1.5–6.5)
RDW: 23.1 % — ABNORMAL HIGH (ref 11.2–14.6)
lymph#: 1.2 10*3/uL (ref 0.9–3.3)

## 2007-03-14 ENCOUNTER — Encounter: Payer: Self-pay | Admitting: Infectious Diseases

## 2007-03-14 LAB — CBC WITH DIFFERENTIAL/PLATELET
BASO%: 0.1 % (ref 0.0–2.0)
Eosinophils Absolute: 0.3 10*3/uL (ref 0.0–0.5)
HCT: 32.2 % — ABNORMAL LOW (ref 38.7–49.9)
LYMPH%: 24.4 % (ref 14.0–48.0)
MONO#: 0.3 10*3/uL (ref 0.1–0.9)
NEUT#: 3.3 10*3/uL (ref 1.5–6.5)
NEUT%: 63.5 % (ref 40.0–75.0)
Platelets: 144 10*3/uL — ABNORMAL LOW (ref 145–400)
WBC: 5.2 10*3/uL (ref 4.0–10.0)
lymph#: 1.3 10*3/uL (ref 0.9–3.3)

## 2007-03-14 LAB — COMPREHENSIVE METABOLIC PANEL
ALT: 12 U/L (ref 0–53)
CO2: 22 mEq/L (ref 19–32)
Calcium: 9.4 mg/dL (ref 8.4–10.5)
Chloride: 106 mEq/L (ref 96–112)
Creatinine, Ser: 0.76 mg/dL (ref 0.40–1.50)
Glucose, Bld: 116 mg/dL — ABNORMAL HIGH (ref 70–99)
Sodium: 139 mEq/L (ref 135–145)
Total Bilirubin: 0.5 mg/dL (ref 0.3–1.2)
Total Protein: 7.5 g/dL (ref 6.0–8.3)

## 2007-03-14 LAB — HOLD TUBE, BLOOD BANK

## 2007-03-20 ENCOUNTER — Encounter: Admission: RE | Admit: 2007-03-20 | Discharge: 2007-03-20 | Payer: Self-pay | Admitting: Infectious Diseases

## 2007-03-20 ENCOUNTER — Ambulatory Visit: Payer: Self-pay | Admitting: Infectious Diseases

## 2007-03-20 DIAGNOSIS — D539 Nutritional anemia, unspecified: Secondary | ICD-10-CM | POA: Insufficient documentation

## 2007-03-20 DIAGNOSIS — K029 Dental caries, unspecified: Secondary | ICD-10-CM

## 2007-03-20 LAB — CONVERTED CEMR LAB
Alkaline Phosphatase: 76 units/L (ref 39–117)
BUN: 12 mg/dL (ref 6–23)
CO2: 25 meq/L (ref 19–32)
Creatinine, Ser: 0.67 mg/dL (ref 0.40–1.50)
Glucose, Bld: 95 mg/dL (ref 70–99)
HCT: 37.4 % — ABNORMAL LOW (ref 39.0–52.0)
HIV 1 RNA Quant: 1850 copies/mL — ABNORMAL HIGH (ref <50)
HIV-1 RNA Quant, Log: 3.27 — ABNORMAL HIGH (ref <1.70)
Hemoglobin: 11.4 g/dL — ABNORMAL LOW (ref 13.0–17.0)
MCHC: 30.5 g/dL (ref 30.0–36.0)
MCV: 113.3 fL — ABNORMAL HIGH (ref 78.0–100.0)
RBC: 3.3 M/uL — ABNORMAL LOW (ref 4.22–5.81)
Total Bilirubin: 0.4 mg/dL (ref 0.3–1.2)

## 2007-03-22 ENCOUNTER — Encounter (INDEPENDENT_AMBULATORY_CARE_PROVIDER_SITE_OTHER): Payer: Self-pay | Admitting: *Deleted

## 2007-03-28 ENCOUNTER — Telehealth: Payer: Self-pay | Admitting: Infectious Diseases

## 2007-03-28 LAB — URINALYSIS, MICROSCOPIC - CHCC
Glucose: NEGATIVE g/dL
Leukocyte Esterase: NEGATIVE
Nitrite: NEGATIVE

## 2007-03-28 LAB — CBC WITH DIFFERENTIAL/PLATELET
BASO%: 1.1 % (ref 0.0–2.0)
Basophils Absolute: 0.1 10*3/uL (ref 0.0–0.1)
HCT: 36.4 % — ABNORMAL LOW (ref 38.7–49.9)
HGB: 12.3 g/dL — ABNORMAL LOW (ref 13.0–17.1)
MONO#: 0.5 10*3/uL (ref 0.1–0.9)
NEUT%: 51.2 % (ref 40.0–75.0)
RDW: 20.8 % — ABNORMAL HIGH (ref 11.2–14.6)
WBC: 6.3 10*3/uL (ref 4.0–10.0)
lymph#: 2.2 10*3/uL (ref 0.9–3.3)

## 2007-04-01 ENCOUNTER — Telehealth: Payer: Self-pay | Admitting: Infectious Diseases

## 2007-04-09 ENCOUNTER — Ambulatory Visit: Payer: Self-pay | Admitting: Oncology

## 2007-04-11 ENCOUNTER — Encounter: Payer: Self-pay | Admitting: Infectious Diseases

## 2007-04-11 LAB — CBC WITH DIFFERENTIAL/PLATELET
Basophils Absolute: 0.1 10*3/uL (ref 0.0–0.1)
HCT: 34.6 % — ABNORMAL LOW (ref 38.7–49.9)
HGB: 11.8 g/dL — ABNORMAL LOW (ref 13.0–17.1)
MONO#: 0.4 10*3/uL (ref 0.1–0.9)
NEUT%: 56.3 % (ref 40.0–75.0)
WBC: 5.2 10*3/uL (ref 4.0–10.0)
lymph#: 1.6 10*3/uL (ref 0.9–3.3)

## 2007-04-11 LAB — URINALYSIS, MICROSCOPIC - CHCC
Bilirubin (Urine): NEGATIVE
Blood: NEGATIVE
Glucose: NEGATIVE g/dL
Leukocyte Esterase: NEGATIVE
Specific Gravity, Urine: 1.03 (ref 1.003–1.035)

## 2007-04-22 ENCOUNTER — Telehealth (INDEPENDENT_AMBULATORY_CARE_PROVIDER_SITE_OTHER): Payer: Self-pay | Admitting: *Deleted

## 2007-04-23 ENCOUNTER — Telehealth (INDEPENDENT_AMBULATORY_CARE_PROVIDER_SITE_OTHER): Payer: Self-pay | Admitting: *Deleted

## 2007-04-25 LAB — CBC WITH DIFFERENTIAL/PLATELET
BASO%: 1 % (ref 0.0–2.0)
EOS%: 5.2 % (ref 0.0–7.0)
Eosinophils Absolute: 0.3 10*3/uL (ref 0.0–0.5)
LYMPH%: 29.5 % (ref 14.0–48.0)
MCHC: 35.5 g/dL (ref 32.0–35.9)
MCV: 109 fL — ABNORMAL HIGH (ref 81.6–98.0)
MONO%: 8.9 % (ref 0.0–13.0)
NEUT#: 2.7 10*3/uL (ref 1.5–6.5)
RBC: 3.03 10*6/uL — ABNORMAL LOW (ref 4.20–5.71)
RDW: 16.6 % — ABNORMAL HIGH (ref 11.2–14.6)

## 2007-05-07 ENCOUNTER — Ambulatory Visit (HOSPITAL_COMMUNITY): Admission: RE | Admit: 2007-05-07 | Discharge: 2007-05-07 | Payer: Self-pay | Admitting: Urology

## 2007-05-08 ENCOUNTER — Encounter: Admission: RE | Admit: 2007-05-08 | Discharge: 2007-05-08 | Payer: Self-pay | Admitting: Infectious Diseases

## 2007-05-08 ENCOUNTER — Ambulatory Visit: Payer: Self-pay | Admitting: Infectious Diseases

## 2007-05-08 LAB — CONVERTED CEMR LAB
AST: 15 units/L (ref 0–37)
Albumin: 4.3 g/dL (ref 3.5–5.2)
Alkaline Phosphatase: 64 units/L (ref 39–117)
BUN: 13 mg/dL (ref 6–23)
Calcium: 9.7 mg/dL (ref 8.4–10.5)
Chloride: 106 meq/L (ref 96–112)
Glucose, Bld: 74 mg/dL (ref 70–99)
HDL: 51 mg/dL (ref >39)
HIV-1 RNA Quant, Log: 2.37 — ABNORMAL HIGH (ref <1.70)
Hemoglobin: 11.3 g/dL — ABNORMAL LOW (ref 13.0–17.0)
LDL Cholesterol: 81 mg/dL (ref 0–99)
Potassium: 4.4 meq/L (ref 3.5–5.3)
RBC: 2.98 M/uL — ABNORMAL LOW (ref 4.22–5.81)
Sodium: 141 meq/L (ref 135–145)
Total Protein: 7.4 g/dL (ref 6.0–8.3)

## 2007-05-09 ENCOUNTER — Encounter: Payer: Self-pay | Admitting: Infectious Diseases

## 2007-05-09 LAB — CBC WITH DIFFERENTIAL/PLATELET
BASO%: 1.1 % (ref 0.0–2.0)
Eosinophils Absolute: 0.3 10*3/uL (ref 0.0–0.5)
MCHC: 35.1 g/dL (ref 32.0–35.9)
MONO#: 0.4 10*3/uL (ref 0.1–0.9)
NEUT#: 2.2 10*3/uL (ref 1.5–6.5)
RBC: 2.93 10*6/uL — ABNORMAL LOW (ref 4.20–5.71)
RDW: 15.7 % — ABNORMAL HIGH (ref 11.2–14.6)
WBC: 4.6 10*3/uL (ref 4.0–10.0)
lymph#: 1.7 10*3/uL (ref 0.9–3.3)

## 2007-05-24 ENCOUNTER — Telehealth (INDEPENDENT_AMBULATORY_CARE_PROVIDER_SITE_OTHER): Payer: Self-pay | Admitting: *Deleted

## 2007-06-05 ENCOUNTER — Ambulatory Visit: Payer: Self-pay | Admitting: Oncology

## 2007-06-17 ENCOUNTER — Ambulatory Visit (HOSPITAL_COMMUNITY): Admission: RE | Admit: 2007-06-17 | Discharge: 2007-06-17 | Payer: Self-pay | Admitting: Oncology

## 2007-06-17 ENCOUNTER — Ambulatory Visit: Payer: Self-pay | Admitting: Surgery

## 2007-06-17 ENCOUNTER — Encounter: Payer: Self-pay | Admitting: Oncology

## 2007-06-19 ENCOUNTER — Telehealth (INDEPENDENT_AMBULATORY_CARE_PROVIDER_SITE_OTHER): Payer: Self-pay | Admitting: *Deleted

## 2007-07-05 ENCOUNTER — Encounter: Payer: Self-pay | Admitting: Infectious Disease

## 2007-07-05 LAB — CBC WITH DIFFERENTIAL/PLATELET
BASO%: 0.6 % (ref 0.0–2.0)
HCT: 32.1 % — ABNORMAL LOW (ref 38.7–49.9)
MCHC: 35.7 g/dL (ref 32.0–35.9)
MONO#: 0.3 10*3/uL (ref 0.1–0.9)
NEUT#: 3.2 10*3/uL (ref 1.5–6.5)
NEUT%: 68.9 % (ref 40.0–75.0)
RBC: 2.77 10*6/uL — ABNORMAL LOW (ref 4.20–5.71)
WBC: 4.7 10*3/uL (ref 4.0–10.0)
lymph#: 0.9 10*3/uL (ref 0.9–3.3)

## 2007-07-23 ENCOUNTER — Telehealth (INDEPENDENT_AMBULATORY_CARE_PROVIDER_SITE_OTHER): Payer: Self-pay | Admitting: *Deleted

## 2007-07-26 ENCOUNTER — Encounter (INDEPENDENT_AMBULATORY_CARE_PROVIDER_SITE_OTHER): Payer: Self-pay | Admitting: *Deleted

## 2007-08-14 ENCOUNTER — Ambulatory Visit: Payer: Self-pay | Admitting: Oncology

## 2007-08-16 LAB — CBC WITH DIFFERENTIAL/PLATELET
BASO%: 0.4 % (ref 0.0–2.0)
LYMPH%: 24.5 % (ref 14.0–48.0)
MCHC: 35.4 g/dL (ref 32.0–35.9)
MONO#: 0.4 10*3/uL (ref 0.1–0.9)
Platelets: 197 10*3/uL (ref 145–400)
RBC: 3.03 10*6/uL — ABNORMAL LOW (ref 4.20–5.71)
RDW: 13.2 % (ref 11.2–14.6)
WBC: 4.3 10*3/uL (ref 4.0–10.0)

## 2007-08-20 ENCOUNTER — Telehealth (INDEPENDENT_AMBULATORY_CARE_PROVIDER_SITE_OTHER): Payer: Self-pay | Admitting: *Deleted

## 2007-08-23 ENCOUNTER — Encounter: Admission: RE | Admit: 2007-08-23 | Discharge: 2007-08-23 | Payer: Self-pay | Admitting: Infectious Diseases

## 2007-08-23 ENCOUNTER — Ambulatory Visit: Payer: Self-pay | Admitting: Infectious Diseases

## 2007-08-23 LAB — CONVERTED CEMR LAB
Albumin: 4 g/dL (ref 3.5–5.2)
Alkaline Phosphatase: 82 units/L (ref 39–117)
BUN: 14 mg/dL (ref 6–23)
CO2: 24 meq/L (ref 19–32)
Calcium: 9 mg/dL (ref 8.4–10.5)
Chloride: 108 meq/L (ref 96–112)
Glucose, Bld: 90 mg/dL (ref 70–99)
HIV 1 RNA Quant: 4900 copies/mL — ABNORMAL HIGH (ref <50)
HIV-1 RNA Quant, Log: 3.69 — ABNORMAL HIGH (ref <1.70)
Lymphocytes Relative: 20 % (ref 12–46)
Lymphs Abs: 0.8 10*3/uL (ref 0.7–4.0)
MCV: 117.3 fL — ABNORMAL HIGH (ref 78.0–100.0)
Monocytes Relative: 10 % (ref 3–12)
Neutro Abs: 2.6 10*3/uL (ref 1.7–7.7)
Neutrophils Relative %: 65 % (ref 43–77)
Platelets: 147 10*3/uL — ABNORMAL LOW (ref 150–400)
Potassium: 4 meq/L (ref 3.5–5.3)
RBC: 3.01 M/uL — ABNORMAL LOW (ref 4.22–5.81)
Sodium: 141 meq/L (ref 135–145)
Total Protein: 7 g/dL (ref 6.0–8.3)
WBC: 4 10*3/uL (ref 4.0–10.5)

## 2007-09-05 ENCOUNTER — Ambulatory Visit: Payer: Self-pay | Admitting: Infectious Diseases

## 2007-09-05 DIAGNOSIS — N35919 Unspecified urethral stricture, male, unspecified site: Secondary | ICD-10-CM

## 2007-09-05 LAB — CONVERTED CEMR LAB

## 2007-09-20 ENCOUNTER — Encounter: Payer: Self-pay | Admitting: Infectious Diseases

## 2007-09-20 LAB — CBC WITH DIFFERENTIAL/PLATELET
BASO%: 0.6 % (ref 0.0–2.0)
EOS%: 4.7 % (ref 0.0–7.0)
LYMPH%: 25.3 % (ref 14.0–48.0)
MCH: 40.7 pg — ABNORMAL HIGH (ref 28.0–33.4)
MCHC: 35.3 g/dL (ref 32.0–35.9)
MCV: 115.2 fL — ABNORMAL HIGH (ref 81.6–98.0)
MONO%: 9 % (ref 0.0–13.0)
Platelets: 149 10*3/uL (ref 145–400)
RBC: 2.98 10*6/uL — ABNORMAL LOW (ref 4.20–5.71)
RDW: 13.4 % (ref 11.2–14.6)

## 2007-09-23 ENCOUNTER — Telehealth (INDEPENDENT_AMBULATORY_CARE_PROVIDER_SITE_OTHER): Payer: Self-pay | Admitting: *Deleted

## 2007-10-18 ENCOUNTER — Telehealth (INDEPENDENT_AMBULATORY_CARE_PROVIDER_SITE_OTHER): Payer: Self-pay | Admitting: *Deleted

## 2007-11-20 ENCOUNTER — Telehealth (INDEPENDENT_AMBULATORY_CARE_PROVIDER_SITE_OTHER): Payer: Self-pay | Admitting: *Deleted

## 2007-11-21 ENCOUNTER — Ambulatory Visit: Payer: Self-pay | Admitting: Infectious Diseases

## 2007-12-09 ENCOUNTER — Ambulatory Visit: Payer: Self-pay | Admitting: Infectious Diseases

## 2007-12-17 ENCOUNTER — Ambulatory Visit: Payer: Self-pay | Admitting: Oncology

## 2007-12-19 ENCOUNTER — Encounter: Payer: Self-pay | Admitting: Infectious Diseases

## 2007-12-19 LAB — CBC WITH DIFFERENTIAL/PLATELET
Basophils Absolute: 0 10*3/uL (ref 0.0–0.1)
EOS%: 3.6 % (ref 0.0–7.0)
Eosinophils Absolute: 0.2 10*3/uL (ref 0.0–0.5)
HGB: 12.8 g/dL — ABNORMAL LOW (ref 13.0–17.1)
LYMPH%: 26.9 % (ref 14.0–48.0)
MCH: 40.2 pg — ABNORMAL HIGH (ref 28.0–33.4)
MCV: 114.9 fL — ABNORMAL HIGH (ref 81.6–98.0)
MONO%: 7.7 % (ref 0.0–13.0)
NEUT#: 2.9 10*3/uL (ref 1.5–6.5)
Platelets: 197 10*3/uL (ref 145–400)

## 2007-12-20 ENCOUNTER — Telehealth (INDEPENDENT_AMBULATORY_CARE_PROVIDER_SITE_OTHER): Payer: Self-pay | Admitting: *Deleted

## 2008-01-16 ENCOUNTER — Telehealth (INDEPENDENT_AMBULATORY_CARE_PROVIDER_SITE_OTHER): Payer: Self-pay | Admitting: *Deleted

## 2008-02-13 ENCOUNTER — Encounter: Payer: Self-pay | Admitting: Infectious Diseases

## 2008-02-14 ENCOUNTER — Telehealth (INDEPENDENT_AMBULATORY_CARE_PROVIDER_SITE_OTHER): Payer: Self-pay | Admitting: *Deleted

## 2008-02-24 ENCOUNTER — Ambulatory Visit: Payer: Self-pay | Admitting: Infectious Diseases

## 2008-02-24 LAB — CONVERTED CEMR LAB
AST: 21 units/L (ref 0–37)
Alkaline Phosphatase: 107 units/L (ref 39–117)
BUN: 16 mg/dL (ref 6–23)
Basophils Relative: 0 % (ref 0–1)
Eosinophils Absolute: 0.1 10*3/uL (ref 0.0–0.7)
Eosinophils Relative: 3 % (ref 0–5)
Glucose, Bld: 85 mg/dL (ref 70–99)
HCT: 34.3 % — ABNORMAL LOW (ref 39.0–52.0)
HDL: 53 mg/dL (ref >39)
LDL Cholesterol: 67 mg/dL (ref 0–99)
Lymphs Abs: 0.9 10*3/uL (ref 0.7–4.0)
MCHC: 35.6 g/dL (ref 30.0–36.0)
MCV: 116.3 fL — ABNORMAL HIGH (ref 78.0–100.0)
Platelets: 131 10*3/uL — ABNORMAL LOW (ref 150–400)
Sodium: 138 meq/L (ref 135–145)
Total Bilirubin: 3.9 mg/dL — ABNORMAL HIGH (ref 0.3–1.2)
Total Protein: 7 g/dL (ref 6.0–8.3)
Triglycerides: 57 mg/dL (ref <150)
VLDL: 11 mg/dL (ref 0–40)
WBC: 4.7 10*3/uL (ref 4.0–10.5)

## 2008-03-11 ENCOUNTER — Ambulatory Visit: Payer: Self-pay | Admitting: Infectious Diseases

## 2008-03-19 ENCOUNTER — Telehealth (INDEPENDENT_AMBULATORY_CARE_PROVIDER_SITE_OTHER): Payer: Self-pay | Admitting: *Deleted

## 2008-04-13 ENCOUNTER — Telehealth (INDEPENDENT_AMBULATORY_CARE_PROVIDER_SITE_OTHER): Payer: Self-pay | Admitting: *Deleted

## 2008-05-06 ENCOUNTER — Telehealth (INDEPENDENT_AMBULATORY_CARE_PROVIDER_SITE_OTHER): Payer: Self-pay | Admitting: *Deleted

## 2008-05-12 ENCOUNTER — Ambulatory Visit: Payer: Self-pay | Admitting: Oncology

## 2008-05-12 ENCOUNTER — Encounter: Payer: Self-pay | Admitting: Infectious Diseases

## 2008-05-22 ENCOUNTER — Encounter (INDEPENDENT_AMBULATORY_CARE_PROVIDER_SITE_OTHER): Payer: Self-pay | Admitting: *Deleted

## 2008-06-03 ENCOUNTER — Telehealth (INDEPENDENT_AMBULATORY_CARE_PROVIDER_SITE_OTHER): Payer: Self-pay | Admitting: *Deleted

## 2008-06-19 ENCOUNTER — Encounter: Payer: Self-pay | Admitting: Infectious Diseases

## 2008-06-19 LAB — CBC WITH DIFFERENTIAL/PLATELET
BASO%: 0.8 % (ref 0.0–2.0)
EOS%: 4.2 % (ref 0.0–7.0)
Eosinophils Absolute: 0.2 10*3/uL (ref 0.0–0.5)
LYMPH%: 23.5 % (ref 14.0–49.0)
MCH: 43.7 pg — ABNORMAL HIGH (ref 27.2–33.4)
MCHC: 35.9 g/dL (ref 32.0–36.0)
MCV: 121.6 fL — ABNORMAL HIGH (ref 79.3–98.0)
MONO%: 7.2 % (ref 0.0–14.0)
NEUT#: 2.6 10*3/uL (ref 1.5–6.5)
Platelets: 158 10*3/uL (ref 140–400)
RBC: 2.65 10*6/uL — ABNORMAL LOW (ref 4.20–5.82)
RDW: 12.9 % (ref 11.0–14.6)

## 2008-06-24 ENCOUNTER — Ambulatory Visit: Payer: Self-pay | Admitting: Infectious Diseases

## 2008-06-24 LAB — CONVERTED CEMR LAB
BUN: 13 mg/dL (ref 6–23)
Basophils Absolute: 0 10*3/uL (ref 0.0–0.1)
CO2: 25 meq/L (ref 19–32)
Creatinine, Ser: 1.06 mg/dL (ref 0.40–1.50)
GFR calc Af Amer: >60 mL/min (ref >60)
GFR calc non Af Amer: >60 mL/min (ref >60)
Glucose, Bld: 96 mg/dL (ref 70–99)
HIV 1 RNA Quant: 173 copies/mL — ABNORMAL HIGH (ref <48)
HIV-1 RNA Quant, Log: 2.24 — ABNORMAL HIGH (ref <1.68)
Hemoglobin: 12.1 g/dL — ABNORMAL LOW (ref 13.0–17.0)
Lymphocytes Relative: 31 % (ref 12–46)
Monocytes Absolute: 0.3 10*3/uL (ref 0.1–1.0)
Monocytes Relative: 9 % (ref 3–12)
Neutro Abs: 2 10*3/uL (ref 1.7–7.7)
RBC: 2.95 M/uL — ABNORMAL LOW (ref 4.22–5.81)
RDW: 12.7 % (ref 11.5–15.5)
Total Bilirubin: 2.6 mg/dL — ABNORMAL HIGH (ref 0.3–1.2)
WBC: 3.7 10*3/uL — ABNORMAL LOW (ref 4.0–10.5)

## 2008-07-07 ENCOUNTER — Telehealth (INDEPENDENT_AMBULATORY_CARE_PROVIDER_SITE_OTHER): Payer: Self-pay | Admitting: *Deleted

## 2008-07-09 ENCOUNTER — Ambulatory Visit: Payer: Self-pay | Admitting: Infectious Diseases

## 2008-07-17 ENCOUNTER — Telehealth (INDEPENDENT_AMBULATORY_CARE_PROVIDER_SITE_OTHER): Payer: Self-pay | Admitting: *Deleted

## 2008-07-27 ENCOUNTER — Encounter (INDEPENDENT_AMBULATORY_CARE_PROVIDER_SITE_OTHER): Payer: Self-pay | Admitting: *Deleted

## 2008-08-03 ENCOUNTER — Telehealth (INDEPENDENT_AMBULATORY_CARE_PROVIDER_SITE_OTHER): Payer: Self-pay | Admitting: *Deleted

## 2008-08-10 ENCOUNTER — Telehealth (INDEPENDENT_AMBULATORY_CARE_PROVIDER_SITE_OTHER): Payer: Self-pay | Admitting: *Deleted

## 2008-08-12 ENCOUNTER — Telehealth: Payer: Self-pay | Admitting: *Deleted

## 2008-08-26 ENCOUNTER — Telehealth: Payer: Self-pay | Admitting: Infectious Diseases

## 2008-08-27 ENCOUNTER — Telehealth (INDEPENDENT_AMBULATORY_CARE_PROVIDER_SITE_OTHER): Payer: Self-pay | Admitting: *Deleted

## 2008-09-16 ENCOUNTER — Ambulatory Visit: Payer: Self-pay | Admitting: Oncology

## 2008-09-29 ENCOUNTER — Telehealth (INDEPENDENT_AMBULATORY_CARE_PROVIDER_SITE_OTHER): Payer: Self-pay | Admitting: *Deleted

## 2008-10-02 ENCOUNTER — Telehealth: Payer: Self-pay

## 2008-10-28 ENCOUNTER — Telehealth (INDEPENDENT_AMBULATORY_CARE_PROVIDER_SITE_OTHER): Payer: Self-pay | Admitting: *Deleted

## 2008-11-24 ENCOUNTER — Ambulatory Visit: Payer: Self-pay | Admitting: Infectious Diseases

## 2008-11-24 LAB — CONVERTED CEMR LAB
ALT: 12 units/L (ref 0–53)
AST: 18 units/L (ref 0–37)
Basophils Absolute: 0 10*3/uL (ref 0.0–0.1)
Basophils Relative: 1 % (ref 0–1)
Calcium: 8.9 mg/dL (ref 8.4–10.5)
Chloride: 106 meq/L (ref 96–112)
Creatinine, Ser: 1.16 mg/dL (ref 0.40–1.50)
Eosinophils Absolute: 0.1 10*3/uL (ref 0.0–0.7)
Eosinophils Relative: 3 % (ref 0–5)
HCT: 38 % — ABNORMAL LOW (ref 39.0–52.0)
Lymphocytes Relative: 26 % (ref 12–46)
MCV: 119.1 fL — ABNORMAL HIGH (ref >=78.0)
Platelets: 153 10*3/uL (ref 150–400)
RDW: 12.8 % (ref 11.5–15.5)

## 2008-11-26 ENCOUNTER — Telehealth (INDEPENDENT_AMBULATORY_CARE_PROVIDER_SITE_OTHER): Payer: Self-pay | Admitting: *Deleted

## 2008-12-07 ENCOUNTER — Ambulatory Visit: Payer: Self-pay | Admitting: Infectious Diseases

## 2008-12-17 ENCOUNTER — Ambulatory Visit: Payer: Self-pay | Admitting: Oncology

## 2009-01-26 ENCOUNTER — Telehealth (INDEPENDENT_AMBULATORY_CARE_PROVIDER_SITE_OTHER): Payer: Self-pay | Admitting: *Deleted

## 2009-02-19 ENCOUNTER — Ambulatory Visit: Payer: Self-pay | Admitting: Oncology

## 2009-02-23 ENCOUNTER — Encounter: Payer: Self-pay | Admitting: Infectious Diseases

## 2009-03-24 ENCOUNTER — Ambulatory Visit: Payer: Self-pay | Admitting: Infectious Diseases

## 2009-03-24 LAB — CONVERTED CEMR LAB
AST: 17 units/L (ref 0–37)
Alkaline Phosphatase: 89 units/L (ref 39–117)
BUN: 10 mg/dL (ref 6–23)
Basophils Absolute: 0 10*3/uL (ref 0.0–0.1)
Calcium: 9.1 mg/dL (ref 8.4–10.5)
Chloride: 106 meq/L (ref 96–112)
Creatinine, Ser: 0.94 mg/dL (ref 0.40–1.50)
Eosinophils Absolute: 0.2 10*3/uL (ref 0.0–0.7)
Eosinophils Relative: 3 % (ref 0–5)
Glucose, Bld: 87 mg/dL (ref 70–99)
HCT: 37.7 % — ABNORMAL LOW (ref 39.0–52.0)
HDL: 71 mg/dL (ref >39)
MCV: 117.1 fL — ABNORMAL HIGH (ref >=78.0)
Platelets: 158 10*3/uL (ref 150–400)
RDW: 13.1 % (ref 11.5–15.5)
Total CHOL/HDL Ratio: 2.2
Triglycerides: 52 mg/dL (ref <150)

## 2009-04-20 ENCOUNTER — Encounter (INDEPENDENT_AMBULATORY_CARE_PROVIDER_SITE_OTHER): Payer: Self-pay | Admitting: *Deleted

## 2009-05-24 ENCOUNTER — Ambulatory Visit: Payer: Self-pay | Admitting: Infectious Diseases

## 2009-09-15 ENCOUNTER — Ambulatory Visit: Payer: Self-pay | Admitting: Infectious Diseases

## 2009-09-15 LAB — CONVERTED CEMR LAB
AST: 19 units/L (ref 0–37)
Alkaline Phosphatase: 69 units/L (ref 39–117)
BUN: 14 mg/dL (ref 6–23)
Basophils Relative: 1 % (ref 0–1)
Creatinine, Ser: 1.13 mg/dL (ref 0.40–1.50)
Eosinophils Absolute: 0.2 10*3/uL (ref 0.0–0.7)
Eosinophils Relative: 4 % (ref 0–5)
Glucose, Bld: 94 mg/dL (ref 70–99)
HCT: 38.5 % — ABNORMAL LOW (ref 39.0–52.0)
Lymphs Abs: 1.4 10*3/uL (ref 0.7–4.0)
MCHC: 34.8 g/dL (ref 30.0–36.0)
MCV: 116 fL — ABNORMAL HIGH (ref 78.0–100.0)
Monocytes Absolute: 0.4 10*3/uL (ref 0.1–1.0)
Monocytes Relative: 8 % (ref 3–12)
Neutrophils Relative %: 58 % (ref 43–77)
Potassium: 4.4 meq/L (ref 3.5–5.3)
RBC: 3.32 M/uL — ABNORMAL LOW (ref 4.22–5.81)
Total Bilirubin: 2.2 mg/dL — ABNORMAL HIGH (ref 0.3–1.2)

## 2009-10-04 ENCOUNTER — Ambulatory Visit: Payer: Self-pay | Admitting: Infectious Diseases

## 2009-10-04 ENCOUNTER — Encounter (INDEPENDENT_AMBULATORY_CARE_PROVIDER_SITE_OTHER): Payer: Self-pay | Admitting: *Deleted

## 2009-10-21 ENCOUNTER — Ambulatory Visit: Payer: Self-pay | Admitting: Oncology

## 2009-10-22 ENCOUNTER — Encounter: Payer: Self-pay | Admitting: Infectious Diseases

## 2009-12-01 ENCOUNTER — Encounter (INDEPENDENT_AMBULATORY_CARE_PROVIDER_SITE_OTHER): Payer: Self-pay | Admitting: *Deleted

## 2009-12-06 ENCOUNTER — Encounter (INDEPENDENT_AMBULATORY_CARE_PROVIDER_SITE_OTHER): Payer: Self-pay | Admitting: *Deleted

## 2010-02-06 LAB — CONVERTED CEMR LAB
AST: 16 units/L (ref 0–37)
Alkaline Phosphatase: 57 units/L (ref 39–117)
BUN: 28 mg/dL — ABNORMAL HIGH (ref 6–23)
Glucose, Bld: 89 mg/dL (ref 70–99)
HDL: 28 mg/dL — ABNORMAL LOW (ref >39)
LDL Cholesterol: 87 mg/dL (ref 0–99)
MCHC: 31.9 g/dL (ref 30.0–36.0)
MCV: 96.1 fL (ref 78.0–100.0)
Potassium: 4.8 meq/L (ref 3.5–5.3)
RBC: 2.84 M/uL — ABNORMAL LOW (ref 3.87–5.11)
RDW: 13.9 % (ref 11.5–14.0)
Sodium: 137 meq/L (ref 135–145)
Total Bilirubin: 0.2 mg/dL — ABNORMAL LOW (ref 0.3–1.2)
Total Protein: 8.3 g/dL (ref 6.0–8.3)
Triglycerides: 122 mg/dL (ref <150)
VLDL: 24 mg/dL (ref 0–40)

## 2010-02-08 NOTE — Miscellaneous (Signed)
Summary: RW Financial Update Paulo Fruit)  Clinical Lists Changes  Observations: Added new observation of PCTFPL: 190.78  (12/06/2009 16:47) Added new observation of HOUSEINCOME: 52841  (12/06/2009 16:47) Added new observation of FINASSESSDT: 10/04/2009  (12/06/2009 16:47) Added new observation of YEARLYEXPEN: 32440  (12/06/2009 16:47)

## 2010-02-08 NOTE — Miscellaneous (Signed)
  Clinical Lists Changes  Observations: Added new observation of YEARAIDSPOS: 2005  (12/01/2009 8:52)

## 2010-02-08 NOTE — Miscellaneous (Signed)
Summary: Orders Update  Clinical Lists Changes  Orders: Added new Service order of Influenza Vaccine NON MCR (40981) - Signed Observations: Added new observation of FLU VAX#1VIS: 08/03/09 version given October 04, 2009. (10/04/2009 13:50) Added new observation of FLU VAXLOT: 11033P (10/04/2009 13:50) Added new observation of FLU VAX EXP: 04/10/2010 (10/04/2009 13:50) Added new observation of FLU VAXBY: Kathi Simpers [facility]) (10/04/2009 13:50) Added new observation of FLU VAXRTE: IM (10/04/2009 13:50) Added new observation of FLU VAX DSE: 0.5 ml (10/04/2009 13:50) Added new observation of FLU VAXMFR: [employer] (10/04/2009 13:50) Added new observation of FLU VAX SITE: left deltoid (10/04/2009 13:50) Added new observation of FLU VAX: Fluvax Non-MCR (10/04/2009 13:50)      Influenza Vaccine    Vaccine Type: Fluvax Non-MCR    Site: left deltoid    Mfr: Nov[employer]is    Dose: 0.5 ml    Route: IM    Given by: Kathi Simpers CMA(AAMA)    Exp. Date: 04/10/2010    Lot #: [id number]    VIS given: 08/03/09 version given October 04, 2009.  Flu Vaccine Consent Questions    Do you have a history of severe allergic reactions to this vaccine? no    Any prior history of allergic reactions to egg and/or gelatin? no    Do you have a sensitivity to the preservative Thimersol? no    Do you have a past history of Guillan-Barre Syndrome? no    Do you currently have an acute febrile illness? no    Have you ever had a severe reaction to latex? no    Vaccine information given and explained to patient? yes

## 2010-02-08 NOTE — Miscellaneous (Signed)
Summary: clinical update/ryan whtie ncadap apprv til 04/09/10  Clinical Lists Changes  Observations: Added new observation of AIDSDAP: Yes 2011 (04/20/2009 10:53)

## 2010-02-08 NOTE — Consult Note (Signed)
[facility]   Imported By: Florinda Marker 03/30/2009 15:55:46  _____________________________________________________________________  External Attachment:    Type:   Image     Comment:   External Document

## 2010-02-08 NOTE — Progress Notes (Signed)
Summary: pt assist meds arrived  Phone Note Refill Request      Prescriptions: REYATAZ 300 MG CAPS (ATAZANAVIR SULFATE) Take 1 tablet by mouth once a day  #30 x 0   Entered by:   Dorie Rank RN   Authorized by:   Johny Sax MD   Signed by:   Dorie Rank RN on 08/03/2008   Method used:   Samples Given   RxID:   [id number] VIREAD 300 MG TABS (TENOFOVIR DISOPROXIL FUMARATE) Take 1 tablet by mouth once a day  #30 x 0   Entered by:   Dorie Rank RN   Authorized by:   Johny Sax MD   Signed by:   Dorie Rank RN on 08/03/2008   Method used:   Samples Given   RxID:   [id number] COMBIVIR 150-300 MG TABS (LAMIVUDINE-ZIDOVUDINE) Take 1 tablet by mouth two times a day  #60 x 0   Entered by:   Dorie Rank RN   Authorized by:   Johny Sax MD   Signed by:   Dorie Rank RN on 08/03/2008   Method used:   Samples Given   RxID:   [id number] NORVIR 100 MG CAPS (RITONAVIR) Take 1 tablet by mouth once a day  #30 x 0   Entered by:   Dorie Rank RN   Authorized by:   Johny Sax MD   Signed by:   Dorie Rank RN on 08/03/2008   Method used:   Samples Given   RxID:   [id number]   Patient Assist Medication Verification: Medication:Norvir Softgel caps 100 mg (refrigerated) Lot# [id number] Exp Date:1/09-2010 Tech approval:Sharon Powers RN  August 03, 2008 5:06 PM             Patient Assist Medication Verification: Medication:Viread 300 mg Lot# [id number] Exp Date:12/2011 Tech approval:Sharon Powers RN  August 03, 2008 5:07 PM   Patient Assist Medication Verification: Medication:Combivir 150/300 mg Lot# [id number] Exp Date:01/2012 Tech approval:Sharon Powers RN  August 03, 2008 5:08 PM   Patient Assist Medication Verification: Medication:Reyataz 300mg  Lot# [id number] Exp Date:04/2010 Tech approval:Sharon Powers RN  August 03, 2008 5:09 PM

## 2010-02-08 NOTE — Consult Note (Signed)
[facility]   Imported By: Florinda Marker 11/17/2009 10:29:55  _____________________________________________________________________  External Attachment:    Type:   Image     Comment:   External Document

## 2010-02-08 NOTE — Assessment & Plan Note (Signed)
Summary: 4 MONTH F/U VS   CC:  4 month follow up.  History of Present Illness: 50yo M with HIV+ and hx of squamous cell Ca of the anus. He has followed up with surgery and heme/onc.  He started xrt and ctx on 05-14-06. He recieved CTX (5FU and mitomycin) for 1 week and XRT. Completed XRT on July 10, 2006.  His course was complicated by a LLE DVT (8-08).   01-08-07  he underwent a hernia repair with mesh placement. Had a hematoma and then wound dehiscence. This was I & D 02-08-07 and has since healed.  Prev on CBV/EFV, then CBV/TFV/KLT then CBV/TFV/ATVr  Most recent  CD4 120, VL 105 (03-24-09). Had genotype naive (minor background mutations) 3-09.  feeling well, meds going well. wt is steady. eating well.   Preventive Screening-Counseling & Management  Alcohol-Tobacco     Alcohol drinks/day: 2     Alcohol type: BEER / AT TIMES     Smoking Status: current     Smoking Cessation Counseling: yes     Packs/Day: <0.25     Year Started: AT THE AGE 53  Caffeine-Diet-Exercise     Caffeine use/day: sodas     Does Patient Exercise: yes     Type of exercise: walking at work     Times/week: 5  Safety-Violence-Falls     Seat Belt Use: yes  Current Medications (verified): 1)  Combivir 150-300 Mg Tabs (Lamivudine-Zidovudine) .... Take 1 Tablet By Mouth Two Times A Day 2)  Viread 300 Mg Tabs (Tenofovir Disoproxil Fumarate) .... Take 1 Tablet By Mouth Once A Day 3)  Norvir 100 Mg Tabs (Ritonavir) .... Take 1 Tablet By Mouth Once A Day 4)  Reyataz 300 Mg Caps (Atazanavir Sulfate) .... Take 1 Tablet By Mouth Once A Day 5)  Bactrim Ds 800-160 Mg Tabs (Sulfamethoxazole-Trimethoprim) .Marland Kitchen.. 1 Tab By Mouth Monday, Wednesday, Friday  Allergies (verified): No Known Drug Allergies    Updated Prior Medication List: COMBIVIR 150-300 MG TABS (LAMIVUDINE-ZIDOVUDINE) Take 1 tablet by mouth two times a day VIREAD 300 MG TABS (TENOFOVIR DISOPROXIL FUMARATE) Take 1 tablet by mouth once a day NORVIR 100 MG TABS  (RITONAVIR) Take 1 tablet by mouth once a day REYATAZ 300 MG CAPS (ATAZANAVIR SULFATE) Take 1 tablet by mouth once a day BACTRIM DS 800-160 MG TABS (SULFAMETHOXAZOLE-TRIMETHOPRIM) 1 tab by mouth monday, wednesday, friday  Current Allergies (reviewed today): No known allergies  Social History: Current Smoker- down to 1/4 ppd Alcohol use-yes- 2-3 beers/week.  Review of Systems       some occas, mild anal tenderness. occas bleeding.  occas molluscum lesions  Vital Signs:  Patient profile:   50 year old male Height:      74 inches (187.96 cm) Weight:      148.12 pounds (67.33 kg) BMI:     19.09 Temp:     97.8 degrees F (36.56 degrees C) oral Pulse rate:   72 / minute BP sitting:   109 / 68  (left arm)  Vitals Entered By: Baxter Hire) (May 24, 2009 9:55 AM) CC: 4 month follow up Pain Assessment Patient in pain? no      Nutritional Status BMI of 19 -24 = normal Nutritional Status Detail appetite is good per patient  Have you ever been in a relationship where you felt threatened, hurt or afraid?No   Does patient need assistance? Functional Status Self care Ambulation Normal        Medication Adherence: 05/24/2009  Adherence to medications reviewed with patient. Counseling to provide adequate adherence provided   Prevention For Positives: 05/24/2009   Safe sex practices discussed with patient. Condoms offered.                             Physical Exam  General:  underweight appearing.   Eyes:  pupils equal, pupils round, and pupils reactive to light.   Mouth:  pharynx pink and moist, no exudates, and poor dentition.   Neck:  no masses.   Lungs:  normal respiratory effort and normal breath sounds.   Heart:  normal rate, regular rhythm, and no murmur.   Abdomen:  soft, non-tender, and normal bowel sounds.   Rectal:  scarring, few raw areas.    Impression & Recommendations:  Problem # 1:  HIV DISEASE (ICD-042) doing well. 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 no change in meds for now.  offered condoms, return to clinic 4months.  His updated medication list for this problem includes:    Bactrim Ds 800-160 Mg Tabs (Sulfamethoxazole-trimethoprim) .Marland Kitchen... 1 tab by mouth monday, wednesday, friday  Problem # 2:  DENTAL CARIES (ICD-521.00) will try to get him into dental.   Problem # 3:  ANAL CANCER (ICD-154.3) my great appreciatino to Dr Alcide Evener. he appears to be doing well. states he has H/O f/u in 8 months.   Problem # 4:  TOBACCO USER (ICD-305.1) gradually quiting!  Other Orders: Est. Patient Level IV (53664) Future Orders: T-CD4SP ([facility]) (CD4SP) ... 08/22/2009 T-HIV Viral Load [phone]) ... 08/22/2009 T-Comprehensive Metabolic Panel [phone]) ... 08/22/2009 T-CBC w/Diff ([id number]) ... 08/22/2009

## 2010-02-08 NOTE — Assessment & Plan Note (Signed)
Summary: 2WK KF/U/VS   CC:  2 week follow up.  History of Present Illness: 50yo M with HIV+ and hx of squamous cell Ca of the anus. He has followed up with surgery and heme/onc.  He started xrt and ctx on 05-14-06. He recieved CTX (5FU and mitomycin) for 1 week and XRT. Completed XRT on July 10, 2006.  His course was complicated by a LLE DVT (8-08).   01-08-07  he underwent a hernia repair with mesh placement. Had a hematoma and then wound dehiscence. This was I & D 02-08-07 and has since healed.  Prev on CBV/EFV, then CBV/TFV/KLT then CBV/TFV/ATVr  Most recent  CD4 180, VL <20 (09-15-09). Had genotype naive (minor background mutations) 3-09. "same old, same old". just working. taking meds well. has had mild thrush in his mouth. eating well, no dysphagia. took some diflucan that he had from a year ago. has had some improvement.   Preventive Screening-Counseling & Management  Alcohol-Tobacco     Alcohol drinks/day: 2     Alcohol type: BEER / AT TIMES     Smoking Status: current     Smoking Cessation Counseling: yes     Packs/Day: <0.25     Year Started: AT THE AGE 71  Caffeine-Diet-Exercise     Caffeine use/day: sodas     Does Patient Exercise: yes     Type of exercise: walking at work     Times/week: 5  Safety-Violence-Falls     Seat Belt Use: yes   Updated Prior Medication List: COMBIVIR 150-300 MG TABS (LAMIVUDINE-ZIDOVUDINE) Take 1 tablet by mouth two times a day VIREAD 300 MG TABS (TENOFOVIR DISOPROXIL FUMARATE) Take 1 tablet by mouth once a day NORVIR 100 MG TABS (RITONAVIR) Take 1 tablet by mouth once a day REYATAZ 300 MG CAPS (ATAZANAVIR SULFATE) Take 1 tablet by mouth once a day BACTRIM DS 800-160 MG TABS (SULFAMETHOXAZOLE-TRIMETHOPRIM) Take 1 tablet by mouth every Monday, Wednesday, and Friday  Current Allergies (reviewed today): No known allergies  Past History:  Past medical, surgical, family and social histories (including risk factors) reviewed, and no changes noted  (except as noted below).  Past Medical History: Reviewed history from 12/09/2007 and no changes required. Gout Hepatitis B, hx of HIV disease Anal Cancer DVT  Family History: Reviewed history from 05/08/2007 and no changes required. Family History Diabetes 1st degree relative (mother) father died of lung cancer/emphysema/pe.  Social History: Reviewed history from 05/24/2009 and no changes required. Current Smoker- down to 1/4 ppd Alcohol use-yes- 2-3 beers/week.  Review of Systems       wt is steady, nl BM, nl urination.   Vital Signs:  Patient profile:   50 year old male Height:      74 inches (187.96 cm) Weight:      147.0 pounds (66.82 kg) BMI:     18.94 Temp:     98.0 degrees F (36.67 degrees C) oral Pulse rate:   54 / minute BP sitting:   110 / 76  (right arm)  Vitals Entered By: Baxter Hire) (October 04, 2009 10:52 AM) CC: 2 week follow up Pain Assessment Patient in pain? no      Nutritional Status BMI of < 19 = underweight Nutritional Status Detail appetite is good per patient  Have you ever been in a relationship where you felt threatened, hurt or afraid?No   Does patient need assistance? Functional Status Self care Ambulation Normal   Physical Exam  General:  well-developed, well-nourished, and  well-hydrated.   Eyes:  pupils equal, pupils round, and pupils reactive to light.   Mouth:  pharynx pink and moist, no exudates, poor dentition, and teeth missing.   Neck:  no masses.   Lungs:  normal respiratory effort and normal breath sounds.   Heart:  normal rate, regular rhythm, and no murmur.   Abdomen:  soft, non-tender, and normal bowel sounds.          Medication Adherence: 10/04/2009   Adherence to medications reviewed with patient. Counseling to provide adequate adherence provided   Prevention For Positives: 10/04/2009   Safe sex practices discussed with patient. Condoms offered.                             Impression &  Recommendations:  Problem # 1:  HIV DISEASE (ICD-042) doing well. scraped his tongue, i do not believe that this is thrush. will get flu shot today. see him back in 5 months with labs prior.  The following medications were removed from the medication list:    Bactrim Ds 800-160 Mg Tabs (Sulfamethoxazole-trimethoprim) .Marland Kitchen... 1 tab by mouth monday, wednesday, friday His updated medication list for this problem includes:    Bactrim Ds 800-160 Mg Tabs (Sulfamethoxazole-trimethoprim) .Marland Kitchen... Take 1 tablet by mouth every monday, wednesday, and friday  Problem # 2:  DENTAL CARIES (ICD-521.00) will cont to try to get him into dental. will try to go [facility].   Problem # 3:  ANAL CANCER (ICD-154.3) cont ot f/u with Dr Truett Perna. has f/u q 8 months.   Other Orders: Est. Patient Level IV (16109) Future Orders: T-CD4SP ([facility]) (CD4SP) ... 04/02/2010 T-HIV Viral Load [phone]) ... 04/02/2010 T-CBC w/Diff ([id number]) ... 04/02/2010 T-RPR (Syphilis) [phone]) ... 04/02/2010 T-Lipid Profile [phone]) ... 04/02/2010

## 2010-02-08 NOTE — Progress Notes (Signed)
Summary: NC[facility]AP/pt assist meds arrived for Jan  Phone Note Refill Request      Prescriptions: BACTRIM DS 800-160 MG TABS (SULFAMETHOXAZOLE-TRIMETHOPRIM) 1 tab by mouth monday, wednesday, friday  #12 x 0   Entered by:   Paulo Fruit  BS,CPht II,MPH   Authorized by:   Johny Sax MD   Signed by:   Paulo Fruit  BS,CPht II,MPH on 01/26/2009   Method used:   Samples Given   RxID:   [id number] REYATAZ 300 MG CAPS (ATAZANAVIR SULFATE) Take 1 tablet by mouth once a day  #30 x 0   Entered by:   Paulo Fruit  BS,CPht II,MPH   Authorized by:   Johny Sax MD   Signed by:   Paulo Fruit  BS,CPht II,MPH on 01/26/2009   Method used:   Samples Given   RxID:   [id number] NORVIR 100 MG TABS (RITONAVIR) Take 1 tablet by mouth once a day  #30 x 0   Entered by:   Paulo Fruit  BS,CPht II,MPH   Authorized by:   Johny Sax MD   Signed by:   Paulo Fruit  BS,CPht II,MPH on 01/26/2009   Method used:   Samples Given   RxID:   [id number] VIREAD 300 MG TABS (TENOFOVIR DISOPROXIL FUMARATE) Take 1 tablet by mouth once a day  #30 x 0   Entered by:   Paulo Fruit  BS,CPht II,MPH   Authorized by:   Johny Sax MD   Signed by:   Paulo Fruit  BS,CPht II,MPH on 01/26/2009   Method used:   Samples Given   RxID:   [id number] COMBIVIR 150-300 MG TABS (LAMIVUDINE-ZIDOVUDINE) Take 1 tablet by mouth two times a day  #60 x 0   Entered by:   Paulo Fruit  BS,CPht II,MPH   Authorized by:   Johny Sax MD   Signed by:   Paulo Fruit  BS,CPht II,MPH on 01/26/2009   Method used:   Samples Given   RxID:   [id number]  Patient Assist Medication Verification: Medication name: SMZ-TMP DS 800/160mg  RX # [id number] Tech approval:MLD   Patient Assist Medication Verification: Medication:Combivir 150mg /300mg  KKX#[id number] Exp Date:JuL 2014 Tech approval:MLD                Patient Assist Medication Verification: Medication: Norvir 100mg  [id number] Exp Date:02 Jeanie Cooks  2012 Tech approval:MLD                Patient Assist Medication Verification: Medication: reyataz 300mg  FYB#[id number] Exp Date:Oct 2012 Tech approval:MLD                Patient Assist Medication Verification: Medication:Viread 300mg  Lot# [id number] Exp Date:07 2014 Tech approval:MLD Call placed to patient with message that assistance medications are ready for pick-up. Left message with patient's mother per his instructions Paulo Fruit  BS,CPht II,MPH  January 26, 2009 10:21 AM                   Appended Document: NC[employer]AP/pt assist meds arrived for Jan Prescription/Samples picked up by: patient

## 2010-03-07 ENCOUNTER — Encounter (INDEPENDENT_AMBULATORY_CARE_PROVIDER_SITE_OTHER): Payer: Self-pay | Admitting: *Deleted

## 2010-03-16 ENCOUNTER — Other Ambulatory Visit (INDEPENDENT_AMBULATORY_CARE_PROVIDER_SITE_OTHER): Payer: Self-pay

## 2010-03-16 ENCOUNTER — Encounter: Payer: Self-pay | Admitting: Infectious Diseases

## 2010-03-16 ENCOUNTER — Other Ambulatory Visit: Payer: Self-pay | Admitting: Infectious Diseases

## 2010-03-16 LAB — CONVERTED CEMR LAB
ALT: 12 units/L (ref 0–53)
AST: 18 units/L (ref 0–37)
Alkaline Phosphatase: 88 units/L (ref 39–117)
Basophils Absolute: 0 10*3/uL (ref 0.0–0.1)
Cholesterol: 136 mg/dL (ref 0–200)
Creatinine, Ser: 1.1 mg/dL (ref 0.40–1.50)
Eosinophils Absolute: 0.2 10*3/uL (ref 0.0–0.7)
Eosinophils Relative: 4 % (ref 0–5)
HCT: 35.5 % — ABNORMAL LOW (ref 39.0–52.0)
HIV 1 RNA Quant: 68 copies/mL — ABNORMAL HIGH (ref <20)
HIV-1 RNA Quant, Log: 1.83 — ABNORMAL HIGH (ref <1.30)
MCV: 122 fL — ABNORMAL HIGH (ref 78.0–100.0)
Platelets: 146 10*3/uL — ABNORMAL LOW (ref 150–400)
RDW: 13.1 % (ref 11.5–15.5)
Total Bilirubin: 3.4 mg/dL — ABNORMAL HIGH (ref 0.3–1.2)
VLDL: 14 mg/dL (ref 0–40)

## 2010-03-17 DIAGNOSIS — B20 Human immunodeficiency virus [HIV] disease: Secondary | ICD-10-CM

## 2010-03-17 LAB — T-HELPER CELL (CD4) - (RCID CLINIC ONLY): CD4 % Helper T Cell: 16 % — ABNORMAL LOW (ref 33–55)

## 2010-03-17 NOTE — Miscellaneous (Signed)
Summary: ADAP Update  Clinical Lists Changes  Observations: Added new observation of AIDSDAP: Pending-2012 approval  (03/07/2010 8:55) Added new observation of YEARLYEXPEN: 0  (03/07/2010 8:55) Added new observation of FINASSESSDT: 03/04/2010  (03/07/2010 8:55) Added new observation of PCTFPL: 168.99  (03/07/2010 8:55) Added new observation of HOUSEINCOME: 40981  (03/07/2010 8:55)

## 2010-03-24 ENCOUNTER — Encounter: Payer: Self-pay | Admitting: Infectious Diseases

## 2010-03-24 ENCOUNTER — Encounter (INDEPENDENT_AMBULATORY_CARE_PROVIDER_SITE_OTHER): Payer: Self-pay | Admitting: *Deleted

## 2010-03-24 LAB — T-HELPER CELL (CD4) - (RCID CLINIC ONLY)
CD4 % Helper T Cell: 15 % — ABNORMAL LOW (ref 33–55)
CD4 T Cell Abs: 180 uL — ABNORMAL LOW (ref 400–2700)

## 2010-03-29 NOTE — Miscellaneous (Signed)
Summary: adap approved til 10/09/10  Clinical Lists Changes  Observations: Added new observation of AIDSDAP: Yes 2012 (03/24/2010 17:39)

## 2010-03-30 ENCOUNTER — Encounter: Payer: Self-pay | Admitting: Infectious Diseases

## 2010-03-30 ENCOUNTER — Ambulatory Visit (INDEPENDENT_AMBULATORY_CARE_PROVIDER_SITE_OTHER): Payer: Self-pay | Admitting: Infectious Diseases

## 2010-03-30 DIAGNOSIS — K029 Dental caries, unspecified: Secondary | ICD-10-CM

## 2010-03-30 DIAGNOSIS — C210 Malignant neoplasm of anus, unspecified: Secondary | ICD-10-CM

## 2010-03-30 DIAGNOSIS — F17200 Nicotine dependence, unspecified, uncomplicated: Secondary | ICD-10-CM

## 2010-03-30 DIAGNOSIS — B20 Human immunodeficiency virus [HIV] disease: Secondary | ICD-10-CM

## 2010-03-30 NOTE — Assessment & Plan Note (Signed)
He is doing very well. Not in relationship, offered condoms. Will cont his current meds incl OI prophylaxis. He will rtc in 4-5 months with labs prior.

## 2010-03-30 NOTE — Assessment & Plan Note (Signed)
Cont to follow up with Onc/[facility] every 8 months. Has had some straining.

## 2010-03-30 NOTE — Progress Notes (Signed)
  Subjective:    Patient ID: [name] [name], male    DOB: 1960-07-05, 50 y.o.   MRN: [id number]  HPI 50 yo M with HIV+ here for f/u. He has been doing well with his medicines. Cont to smoke but down to 1/3 ppd. Continues to try to slow down his smoking. Having some occas problems with constipation after taking 2 lomotil for loose BM after he had [place] food.    CD4 190, VL 68 (03-16-10).   Review of Systems  Constitutional: Negative for appetite change.  Respiratory: Negative for cough.   Gastrointestinal: Positive for constipation.       Early satiety  Genitourinary: Negative for dysuria.       Objective:   Physical Exam  Constitutional: He appears well-developed and well-nourished.  Eyes: EOM are normal. Pupils are equal, round, and reactive to light.  Neck: Neck supple.  Cardiovascular: Normal rate, regular rhythm and normal heart sounds.   Pulmonary/Chest: Effort normal and breath sounds normal.  Abdominal: Soft. Bowel sounds are normal. He exhibits no distension. There is no tenderness.          Assessment & Plan:

## 2010-03-30 NOTE — Assessment & Plan Note (Signed)
Counseled to quit 

## 2010-03-30 NOTE — Assessment & Plan Note (Signed)
Will have him seen in dental clinic here

## 2010-04-03 LAB — T-HELPER CELL (CD4) - (RCID CLINIC ONLY): CD4 % Helper T Cell: 12 % — ABNORMAL LOW (ref 33–55)

## 2010-04-13 LAB — T-HELPER CELL (CD4) - (RCID CLINIC ONLY): CD4 T Cell Abs: 130 uL — ABNORMAL LOW (ref 400–2700)

## 2010-05-18 ENCOUNTER — Other Ambulatory Visit: Payer: Self-pay | Admitting: *Deleted

## 2010-05-18 DIAGNOSIS — B20 Human immunodeficiency virus [HIV] disease: Secondary | ICD-10-CM

## 2010-05-18 MED ORDER — TENOFOVIR DISOPROXIL FUMARATE 300 MG PO TABS: 300.0000 mg | ORAL_TABLET | Freq: Every day | ORAL | Status: DC

## 2010-05-18 MED ORDER — LAMIVUDINE-ZIDOVUDINE 150-300 MG PO TABS: 1.0000 | ORAL_TABLET | Freq: Two times a day (BID) | ORAL | Status: DC

## 2010-05-18 MED ORDER — ATAZANAVIR SULFATE 300 MG PO CAPS: 300.0000 mg | ORAL_CAPSULE | Freq: Every day | ORAL | Status: DC

## 2010-05-18 MED ORDER — RITONAVIR 100 MG PO CAPS: 100.0000 mg | ORAL_CAPSULE | Freq: Every day | ORAL | Status: DC

## 2010-05-18 MED ORDER — SULFAMETHOXAZOLE-TRIMETHOPRIM 800-160 MG PO TABS: 1.0000 | ORAL_TABLET | ORAL | Status: DC

## 2010-05-24 NOTE — Op Note (Signed)
[name] [name], [name] [name]                ACCOUNT NO.:  [id number]   MEDICAL RECORD NO.:  [id number]          PATIENT TYPE:  AMB   LOCATION:  DAY                          FACILITY:  [facility]   PHYSICIAN:  Angelia Mould. Derrell Lolling, M.D.DATE OF BIRTH:  December 22, 1960   DATE OF PROCEDURE:  11/27/2006  DATE OF DISCHARGE:                               OPERATIVE REPORT   PREOPERATIVE DIAGNOSES:  1. Squamous cell carcinoma of the anus, status post chemotherapy and      radiation therapy.  2. Nonhealing ulcer perianal skin, posterior midline.   POSTOPERATIVE DIAGNOSES:  1. Squamous cell carcinoma of the anus, status post chemotherapy and      radiation therapy.  2. Nonhealing ulcer perianal skin, posterior midline.   OPERATION PERFORMED:  1. Examination under anesthesia.  2. Rigid proctoscopy.  3. Punch biopsies of perianal skin.   SURGEON:  Angelia Mould. Derrell Lolling, M.D.   OPERATIVE INDICATIONS:  This is a 50 year old white man who is HIV-  positive and has a history of hepatitis B.  He presented originally a  few months ago with a very extensive, locally-advanced invasive squamous  cell carcinoma of the anus.  He underwent primary radiation therapy and  chemotherapy.  He has done reasonably well.  He has extensive radiation  changes in the perianal skin.  In the posterior midline about 6 cm away  from the anus, there is a firm ulcer which actually is smaller than it  was a month ago.  It is still quite firm.  There are some other firm  areas of the skin.  Dr. Truett Perna and I both feel that random biopsy  should be performed to rule out persistent disease.  This has been  discussed with the patient as an outpatient and he has undergone a  rectal prep.   OPERATIVE TECHNIQUE:  Following the induction of a general LMA  anesthetic, the patient was placed in a lithotomy position in rigid  padded stirrups.  Perianal area was prepped and draped in a sterile  fashion.  Digital rectal exam revealed a slightly  diminished sphincter  tone.  The anal canal itself and the dentate line were soft with no  nodules or ulceration.  Anoscopy was performed circumferentially and I  saw no nodules or ulceration at the dentate line or just above.  Rigid  proctoscopy was carried out.  The colon was somewhat stiff and I could  only go about 13 or 14 cm but I saw no abnormalities of the distal  rectal mucosa.   I then observed the perianal skin with a very large area of radiation  change consistent with his large tumor.  There was no exophytic tumor.  In the posterior midline about 6 cm away from the anus there was about a  1-cm ulcer with firm edges.   Marcaine 0.5% with epinephrine was used as a local infiltration  anesthetic.  I then took three punch biopsies around and through the  ulcer in the posterior midline 6 cm away from the anus.  These were sent  separately.   I then took a punch  biopsy in the right posterior position, left  posterior position, and anterior midline.  In the left posterior and  right posterior positions the skin was somewhat thicker than the  surrounding tissue.  In the anterior midline the skin was discolored but  was not thickened.  All of these areas were cauterized and injected with  Marcaine.  External dry bandage was placed.  The patient tolerated the  procedure well and was taken recovery room in stable condition.  Estimated blood loss was about 10 mL.  Complications:  None.  Sponge,  needle and instrument counts were correct.      Angelia Mould. Derrell Lolling, M.D.  Electronically Signed     HMI/MEDQ  D:  11/27/2006  T:  11/27/2006  Job:  [id number]   cc:   Leighton Roach. Truett Perna, M.D.  Fax: [phone]   Pablo Lawrence. Philipp Deputy, M.D.  Fax: [phone]

## 2010-05-24 NOTE — Op Note (Signed)
[name] [name], [name] [name]                ACCOUNT NO.:  [id number]   MEDICAL RECORD NO.:  [id number]          PATIENT TYPE:  AMB   LOCATION:  DAY                          FACILITY:  [facility]   PHYSICIAN:  Angelia Mould. Derrell Lolling, M.D.DATE OF BIRTH:  1960/10/26   DATE OF PROCEDURE:  01/08/2007  DATE OF DISCHARGE:                               OPERATIVE REPORT   PREOPERATIVE DIAGNOSIS:  Recurrent right inguinal hernia.   POSTOPERATIVE DIAGNOSIS:  Recurrent right inguinal hernia.   OPERATION PERFORMED:  1. Repair of recurrent right inguinal hernia with Ultrapro mesh.  2. Partial omentectomy.   SURGEON:  Claud Kelp, M.D.   OPERATIVE INDICATIONS:  This is a 50 year old white man who is HIV-  positive and has a history of hepatitis B.  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  He  underwent primary chemoradiation.  He has undergone surveillance  biopsies and has no evidence of recurrence to date.  His health has been  relatively stable.  He had a right inguinal hernia repair in 1982 and  that has recurred and it bothers him.  This is relatively large.  We  have followed this for some time through his other treatments and he now  wants to have this repaired because of the pain.  He is brought to the  operating room electively.   OPERATIVE FINDINGS:  The patient had a large indirect right inguinal  hernia which was recurrent.  There was large piece of omentum  incarcerated in this which required dissection all the way back to the  internal ring and a partial omentectomy.  I did not see any evidence of  the prior mesh repair, but there were some sutures near the internal  ring suggesting prior surgery.  There was lots of scar tissue as well.   OPERATIVE TECHNIQUE:  Following induction of general endotracheal  anesthesia, the patient's right groin and genitalia were prepped and  draped in a sterile fashion.  The patient was identified as the  correct  patient, correct site, correct procedure.  Intravenous antibiotics were  given.  0.5% Marcaine with epinephrine was used as a local infiltration  anesthetic.  I used about 20 mL of the Marcaine.  A transverse somewhat  oblique incision was made in the right groin overlying the inguinal  canal.  Dissection was carried down through the subcutaneous tissue.  I  exposed the aponeurosis of the external oblique laterally and dissected  from lateral to medially exposing the external oblique all the way to  the level of the internal ring.  I then incised the external oblique  laterally and then dissected medially slowly until I could dissect the  external oblique off of the cord structures.  This was a little bit  tedious because of previous scar tissue, but the anatomy was readily  identified.  I dissected the external oblique away from the inguinal  floor and placed self-retaining retractors.  I slowly dissected up the  cord structures and then encircled them with a Penrose drain.  I divided  the  cremasteric muscle fibers.  I separated the testicular vessels and  vas deferens away from the indirect hernia sac.  Once I had the indirect  hernia sac completely dissected free, I opened it up and found that I  could pass my finger through the sac into the peritoneal space.  There  was a large amount of omentum chronically adherent to the wall of the  indirect sac.  I slowly dissected open the sac and slowly dissected the  omentum away from the wall of the sac.  Once I got back to the level of  the internal ring, the omentum required resection because of bleeding.  I clamped the base of the omentum with three or four hemostats and  amputated the omentum.  Because of his history of rectal cancer, I sent  the omentum for routine histology.  The omental vessels were then  ligated with 2-0 Vicryl ties.  Hemostasis was excellent.  I then closed  the indirect hernia sac with a pursestring suture of  2-0 Vicryl and then  amputated the redundant sac.  The wound was irrigated with saline.  The  floor of the inguinal canal was repaired and reinforced with an onlay  Ultrapro mesh.  The mesh was brought to the operative field and trimmed  at the corners to accommodate the wound.  The mesh was sutured in place  with running sutures of 2-0 Prolene in interrupted sutures of 2-0  Prolene.  The mesh was sutured so as to generously overlap the fascia at  the pubic tubercle, along the inguinal ligament inferiorly.  Medially  and superiomedially several interrupted mattress sutures of 2-0 Prolene  were placed to secure the mesh.  Superiorly and superolaterally a  running suture of 2-0 Prolene was used.  The mesh was incised laterally  so as to wrap around the cord structures at the internal ring.  The  tails of the mesh were overlapped laterally and the suture line  completed.  I used one more interrupted sutures of 2-0 Prolene just  lateral to the internal ring to snug up the mesh.  This provided a very  secure repair both medial and lateral to the internal ring, but allowed  a fingertip opening for the cord structures to emerge.  The wound was  irrigated with saline.  Hemostasis was excellent.  The repair looked  good.  The external oblique was closed with a running suture of 2-0  Vicryl placing the cord structures deep to the external oblique.  Scarpa  fascia was closed with 3-0 Vicryl sutures and the skin closed with a  running subcuticular suture of 4-0 Monocryl and Dermabond.  Clean  bandages were placed and the patient taken to recovery room in stable  condition.  Estimated blood loss was about 10 mL.  Complications none.  Sponge, needle and instrument counts were correct.      Angelia Mould. Derrell Lolling, M.D.  Electronically Signed     HMI/MEDQ  D:  01/08/2007  T:  01/08/2007  Job:  [id number]   cc:   Leighton Roach. Truett Perna, M.D.  Fax: [phone]   Lacretia Leigh. Ninetta Lights, M.D.  Fax: [phone]   Pablo Lawrence. Philipp Deputy, M.D.  Fax: [phone]

## 2010-05-27 NOTE — Op Note (Signed)
NAMELEOVANNI, BJORKMAN                ACCOUNT NO.:  [id number]   MEDICAL RECORD NO.:  [id number]          PATIENT TYPE:  AMB   LOCATION:  DAY                          FACILITY:  [facility]   PHYSICIAN:  Angelia Mould. Derrell Lolling, M.D.DATE OF BIRTH:  11/09/1960   DATE OF PROCEDURE:  04/27/2006  DATE OF DISCHARGE:                               OPERATIVE REPORT   PREOPERATIVE DIAGNOSIS:  Perianal mass, rule out squamous cell  carcinoma.   POSTOPERATIVE DIAGNOSES:  1. Perianal mass, rule out squamous cell carcinoma.  2. Bilateral perineal fistulae.   SURGEON:  Angelia Mould. Derrell Lolling, M.D.   OPERATIVE INDICATIONS:  This is a 50 year old white man who is HIV-  positive and hepatitis B-positive.  He denies rectal problems in the  past.  He denies GI symptoms in the past.  He has had a perianal mass  and some drainage for over a year.  He says his bowel movements have  been normal.  He has had some drainage and was put on antibiotics.  On  exam, he appears to be a thin, chronically ill man but in no distress.  Abdomen is soft and nontender.  Rectal exam shows a huge exophytic and  ulcerated mass which begins at the posterior rim of the anus and extends  posteriorly for a distance of at least 10-12 cm, is deeply ulcerated  centrally with rolled-out, neoplastic-appearing edges.  He has a couple  of areas of purulent drainage posterolateral to the anal canal.  An anal  sphincters appear intact.  He is brought to the operating room for  examination under anesthesia and biopsy to rule out cancer.   OPERATIVE TECHNIQUE:  Following the induction of general endotracheal  anesthesia, the patient was placed in the dorsal lithotomy position and  the perianal and perineal tissues prepped and draped in a sterile  fashion.  Examination confirmed that the ulcerated mass extended all the  way up to the posterior rim of the anal canal but did not extend above  the dentate line.  The mass extended posteriorly along the  intergluteal  area for at least 12-13 cm.  This was deeply ulcerated and friable.  There was a small area of purulent drainage posterolateral to the anus  on the skin on the left and another one on the right.   Rigid proctoscopy was carried.  I was able to go to about 15 or 16 cm.  I saw no mucosal abnormality.  There was a lot of tortuosity and I could  not go further.   Then using a knife and electrocautery, I excised some of the tissue at  the posterior edge of the anus.  It was fairly easy to remove this and  avoid the sphincters.  I also went down on the left side more lateral  and more posterior and excised some more of the tissue there, taking  about 3 or 4 g of tissue on each of these two biopsies.  These were sent  separately for routine histology.  These areas were irrigated and  hemostasis was good and achieved with electrocautery.  I then took a rectal probe and found a draining sinus in the left  gluteal area and placed the probe in that and it went back toward the  midline behind the mass.  I made a 1 cm incision and opened this area up  and cultured the fluid.  I did a similar opening of a fistula track of  the right side in the right gluteal area.  These areas were drained well  and  stopped bleeding, and I placed an Iodoform wick in each of these wounds.  Clean bandages were placed.  The patient was taken to the recovery room  in stable condition.  Estimated blood loss was about 15-20 mL.  Complications:  None.  Sponge, needle and counts were correct.      Angelia Mould. Derrell Lolling, M.D.  Electronically Signed     HMI/MEDQ  D:  04/27/2006  T:  04/27/2006  Job:  [id number]   cc:   Tresa Endo L. Philipp Deputy, M.D.  Fax: [phone]   Lacretia Leigh. Ninetta Lights, M.D.  Fax: [phone]

## 2010-06-15 ENCOUNTER — Other Ambulatory Visit: Payer: Self-pay | Admitting: Licensed Clinical Social Worker

## 2010-06-15 DIAGNOSIS — B20 Human immunodeficiency virus [HIV] disease: Secondary | ICD-10-CM

## 2010-06-15 MED ORDER — RITONAVIR 100 MG PO CAPS: 100.0000 mg | ORAL_CAPSULE | Freq: Every day | ORAL | Status: DC

## 2010-06-20 ENCOUNTER — Other Ambulatory Visit: Payer: Self-pay | Admitting: *Deleted

## 2010-06-20 DIAGNOSIS — B20 Human immunodeficiency virus [HIV] disease: Secondary | ICD-10-CM

## 2010-06-20 MED ORDER — RITONAVIR 100 MG PO TABS: 100.0000 mg | ORAL_TABLET | Freq: Every day | ORAL | Status: DC

## 2010-07-14 ENCOUNTER — Encounter (HOSPITAL_BASED_OUTPATIENT_CLINIC_OR_DEPARTMENT_OTHER): Payer: Self-pay | Admitting: Oncology

## 2010-07-14 DIAGNOSIS — C211 Malignant neoplasm of anal canal: Secondary | ICD-10-CM

## 2010-08-22 ENCOUNTER — Other Ambulatory Visit (INDEPENDENT_AMBULATORY_CARE_PROVIDER_SITE_OTHER): Payer: Self-pay

## 2010-08-22 ENCOUNTER — Ambulatory Visit: Payer: Self-pay

## 2010-08-22 DIAGNOSIS — B20 Human immunodeficiency virus [HIV] disease: Secondary | ICD-10-CM

## 2010-08-23 LAB — COMPREHENSIVE METABOLIC PANEL
Albumin: 4.4 g/dL (ref 3.5–5.2)
Alkaline Phosphatase: 86 U/L (ref 39–117)
BUN: 13 mg/dL (ref 6–23)
CO2: 26 mEq/L (ref 19–32)
Calcium: 9.3 mg/dL (ref 8.4–10.5)
Glucose, Bld: 104 mg/dL — ABNORMAL HIGH (ref 70–99)
Potassium: 4.4 mEq/L (ref 3.5–5.3)
Sodium: 140 mEq/L (ref 135–145)
Total Protein: 6.8 g/dL (ref 6.0–8.3)

## 2010-08-23 LAB — CBC WITH DIFFERENTIAL/PLATELET
Basophils Relative: 0 % (ref 0–1)
Eosinophils Absolute: 0.2 10*3/uL (ref 0.0–0.7)
HCT: 36.7 % — ABNORMAL LOW (ref 39.0–52.0)
Hemoglobin: 12.4 g/dL — ABNORMAL LOW (ref 13.0–17.0)
Lymphs Abs: 1.2 10*3/uL (ref 0.7–4.0)
MCH: 40.9 pg — ABNORMAL HIGH (ref 26.0–34.0)
MCHC: 33.8 g/dL (ref 30.0–36.0)
Monocytes Absolute: 0.4 10*3/uL (ref 0.1–1.0)
Monocytes Relative: 6 % (ref 3–12)
Neutro Abs: 4.3 10*3/uL (ref 1.7–7.7)
Neutrophils Relative %: 70 % (ref 43–77)
RBC: 3.03 MIL/uL — ABNORMAL LOW (ref 4.22–5.81)

## 2010-08-23 LAB — HIV-1 RNA QUANT-NO REFLEX-BLD
HIV 1 RNA Quant: 197 copies/mL — ABNORMAL HIGH (ref <20)
HIV-1 RNA Quant, Log: 2.29 {Log} — ABNORMAL HIGH (ref <1.30)

## 2010-08-23 LAB — T-HELPER CELL (CD4) - (RCID CLINIC ONLY): CD4 T Cell Abs: 190 uL — ABNORMAL LOW (ref 400–2700)

## 2010-09-05 ENCOUNTER — Ambulatory Visit (INDEPENDENT_AMBULATORY_CARE_PROVIDER_SITE_OTHER): Payer: Self-pay | Admitting: Infectious Diseases

## 2010-09-05 ENCOUNTER — Encounter: Payer: Self-pay | Admitting: Infectious Diseases

## 2010-09-05 VITALS — BP 107/69 | HR 50 | Temp 98.2°F | Ht 74.0 in | Wt 141.5 lb

## 2010-09-05 DIAGNOSIS — Z113 Encounter for screening for infections with a predominantly sexual mode of transmission: Secondary | ICD-10-CM

## 2010-09-05 DIAGNOSIS — Z79899 Other long term (current) drug therapy: Secondary | ICD-10-CM

## 2010-09-05 DIAGNOSIS — C210 Malignant neoplasm of anus, unspecified: Secondary | ICD-10-CM

## 2010-09-05 DIAGNOSIS — B20 Human immunodeficiency virus [HIV] disease: Secondary | ICD-10-CM

## 2010-09-05 DIAGNOSIS — K029 Dental caries, unspecified: Secondary | ICD-10-CM

## 2010-09-05 DIAGNOSIS — Z23 Encounter for immunization: Secondary | ICD-10-CM

## 2010-09-05 NOTE — Assessment & Plan Note (Signed)
Will refer him to dental.  

## 2010-09-05 NOTE — Assessment & Plan Note (Addendum)
He appears to be stable. Will continue his current meds. Gets flu shot today. Refer him to [facility] to get ensure, rx written. Offered condoms (refuses). Will see him back in 5-6 months.

## 2010-09-05 NOTE — Assessment & Plan Note (Signed)
Greatly appreciate partnering with Dr Truett Perna.

## 2010-09-05 NOTE — Progress Notes (Signed)
  Subjective:    Patient ID: [name] [name], male    DOB: 1960-01-15, 50 y.o.   MRN: [id number]  HPI 50 yo M with HIV+ and Anal Cancer here for f/u. Has picked up a cold or allergies- dry cough and sneezing. Cont to smoke but down to 1/3 ppd. Weight has come up 3-4# recently. Meds going well. Has been alternating doses of colace to try and get BM regulated. Still has bleeding with wiping some times. Is being set up to have colonoscopy.   CD4 190, VL 197 (08-22-10).     Review of Systems     Objective:   Physical Exam  Constitutional:       underweight  HENT:  Mouth/Throat:    Eyes: EOM are normal. Pupils are equal, round, and reactive to light.  Neck: Neck supple.  Cardiovascular: Normal rate, regular rhythm and normal heart sounds.   Pulmonary/Chest: Effort normal and breath sounds normal. No respiratory distress.  Abdominal: Soft. Bowel sounds are normal. He exhibits no distension. There is no tenderness.          Assessment & Plan:

## 2010-09-22 ENCOUNTER — Other Ambulatory Visit: Payer: Self-pay | Admitting: *Deleted

## 2010-09-22 DIAGNOSIS — B20 Human immunodeficiency virus [HIV] disease: Secondary | ICD-10-CM

## 2010-09-22 MED ORDER — ENSURE PO LIQD: 1.0000 | Freq: Two times a day (BID) | ORAL | Status: DC

## 2010-09-29 LAB — CROSSMATCH: Antibody Screen: NEGATIVE

## 2010-09-29 LAB — ABO/RH: ABO/RH(D): B POS

## 2010-09-30 LAB — CROSSMATCH: Antibody Screen: NEGATIVE

## 2010-09-30 LAB — T-HELPER CELL (CD4) - (RCID CLINIC ONLY): CD4 T Cell Abs: 70 — ABNORMAL LOW

## 2010-10-04 LAB — T-HELPER CELL (CD4) - (RCID CLINIC ONLY)
CD4 % Helper T Cell: 12 — ABNORMAL LOW
CD4 T Cell Abs: 100 — ABNORMAL LOW

## 2010-10-07 LAB — T-HELPER CELL (CD4) - (RCID CLINIC ONLY)
CD4 % Helper T Cell: 12 — ABNORMAL LOW
CD4 T Cell Abs: 100 — ABNORMAL LOW

## 2010-10-11 LAB — T-HELPER CELL (CD4) - (RCID CLINIC ONLY)
CD4 % Helper T Cell: 15 — ABNORMAL LOW
CD4 T Cell Abs: 160 — ABNORMAL LOW

## 2010-10-18 LAB — CBC
HCT: 30.8 — ABNORMAL LOW
Hemoglobin: 10.7 — ABNORMAL LOW
MCHC: 34.8
MCV: 114 — ABNORMAL HIGH
Platelets: 158
RDW: 14.5

## 2010-10-18 LAB — COMPREHENSIVE METABOLIC PANEL
Alkaline Phosphatase: 63
BUN: 10
Calcium: 9.4
Creatinine, Ser: 1.07
Glucose, Bld: 122 — ABNORMAL HIGH
Potassium: 4.2
Total Protein: 7.2

## 2010-10-19 LAB — T-HELPER CELL (CD4) - (RCID CLINIC ONLY)
CD4 % Helper T Cell: 17 — ABNORMAL LOW
CD4 T Cell Abs: 120 — ABNORMAL LOW

## 2010-10-24 LAB — T-HELPER CELL (CD4) - (RCID CLINIC ONLY): CD4 T Cell Abs: 80 — ABNORMAL LOW

## 2010-11-09 ENCOUNTER — Other Ambulatory Visit: Payer: Self-pay | Admitting: *Deleted

## 2010-11-09 DIAGNOSIS — B20 Human immunodeficiency virus [HIV] disease: Secondary | ICD-10-CM

## 2010-11-09 MED ORDER — ATAZANAVIR SULFATE 300 MG PO CAPS: 300.0000 mg | ORAL_CAPSULE | Freq: Every day | ORAL | Status: DC

## 2010-11-09 MED ORDER — TENOFOVIR DISOPROXIL FUMARATE 300 MG PO TABS: 300.0000 mg | ORAL_TABLET | Freq: Every day | ORAL | Status: DC

## 2010-11-09 MED ORDER — LAMIVUDINE-ZIDOVUDINE 150-300 MG PO TABS: 1.0000 | ORAL_TABLET | Freq: Two times a day (BID) | ORAL | Status: DC

## 2010-11-09 MED ORDER — SULFAMETHOXAZOLE-TRIMETHOPRIM 800-160 MG PO TABS: 1.0000 | ORAL_TABLET | ORAL | Status: DC

## 2011-03-03 ENCOUNTER — Ambulatory Visit: Payer: Self-pay

## 2011-04-10 ENCOUNTER — Telehealth: Payer: Self-pay | Admitting: Oncology

## 2011-04-10 NOTE — Telephone Encounter (Signed)
lmonvm for pt re appt for 5/10. Schedule mailed

## 2011-05-19 ENCOUNTER — Ambulatory Visit (HOSPITAL_BASED_OUTPATIENT_CLINIC_OR_DEPARTMENT_OTHER): Payer: Self-pay | Admitting: Oncology

## 2011-05-19 ENCOUNTER — Telehealth: Payer: Self-pay | Admitting: Oncology

## 2011-05-19 VITALS — BP 101/58 | HR 57 | Temp 97.0°F | Ht 74.0 in | Wt 140.2 lb

## 2011-05-19 DIAGNOSIS — B20 Human immunodeficiency virus [HIV] disease: Secondary | ICD-10-CM

## 2011-05-19 DIAGNOSIS — Z85048 Personal history of other malignant neoplasm of rectum, rectosigmoid junction, and anus: Secondary | ICD-10-CM

## 2011-05-19 DIAGNOSIS — Z86718 Personal history of other venous thrombosis and embolism: Secondary | ICD-10-CM

## 2011-05-19 DIAGNOSIS — C211 Malignant neoplasm of anal canal: Secondary | ICD-10-CM

## 2011-05-19 NOTE — Telephone Encounter (Signed)
Gave pt appt calendar for May 2014 MD only

## 2011-05-19 NOTE — Progress Notes (Signed)
   [facility]    OFFICE PROGRESS NOTE   INTERVAL HISTORY:   He returns as scheduled. He has "flat "bowel movements. There is occasional bleeding after a bowel movement. The blood does not appear to be in the stool. The fibrotic changes at the groin and perineum have not changed.  He lost insurance coverage and has not been able to see Dr. Ninetta Lights. He continues medical therapy for HIV. No recent infections aside from a "sore throat".    Objective:  Vital signs in last 24 hours:  Blood pressure 101/58, pulse 57, temperature 97 F (36.1 C), temperature source Oral, height 6\' 2"  (1.88 m), weight 140 lb 3.2 oz (63.594 kg).    HEENT: Pharynx without erythema or exudate, no thrush Lymphatics: No cervical, supraclavicular, axillary, or inguinal nodes Resp: Lungs with very mild inspiratory wheeze bilaterally, no respiratory distress, good air movement bilaterally Cardio: Regular rate and rhythm GI: No hepatosplenomegaly, no mass, inguinal surgical defect at the right low abdomen. Vascular: Slight enlargement of the left compared to the right lower leg with chronic varicosities   rectal: Extensive fibrosis at the perineum, groin, and pubic area. The gluteal space is fibrosed limiting examination of the anus. Several scarred areas and superficial abrasions without nodularity.    Medications: I have reviewed the patient's current medications.  Assessment/Plan: 1. Anal cancer diagnosed in April 2008 status post concurrent chemotherapy and radiation.  He is status post a negative perianal skin biopsy, November 27, 2006. 2. Status post bilateral inguinal hernia repair, January 08, 2007. 3. Hematoma following the inguinal hernia repair status post evacuation of the hematoma, January 15, 2007.  The right lower quadrant wound healed by secondary intention, and there is a remaining wound defect in the right lower abdomen. 4. History of left lower extremity deep venous thrombosis.   He is maintained off of Coumadin. 5. History of thrombocytopenia secondary to chemotherapy, radiation, polypharmacy and human immunodeficiency virus infection. 6. History of anemia secondary to surgery, the right groin hematoma and human immunodeficiency virus infection. 7. Human immunodeficiency virus infection followed by Dr. Ninetta Lights. 8. History of oral candidiasis.  He no longer takes Diflucan. 9. History of anorexia/weight loss. Stable today.  10. History of hematuria, status post a cystoscopy revealing urethral stricture, status post a dilatation procedure by Dr. Vernie Ammons. 11. Extensive perineal/groin radiation fibrosis and scarring.  No clinical evidence for local recurrence of cancer.   Disposition:  He remains in clinical remission from the anal cancer. He will return for an office visit in one year. He plans to schedule an appointment with Dr. Ninetta Lights in the near future. It will be very difficult for him to undergo a colonoscopy procedure with the current degree of anal/perineum fibrosis. He cannot afford this at present. He has a low chance of developing a local recurrence now greater than 5 years out from the time of diagnosis.   [name] Papas, MD  05/19/2011  11:30 AM

## 2011-06-16 ENCOUNTER — Other Ambulatory Visit: Payer: Self-pay | Admitting: *Deleted

## 2011-06-16 DIAGNOSIS — B20 Human immunodeficiency virus [HIV] disease: Secondary | ICD-10-CM

## 2011-06-16 MED ORDER — RITONAVIR 100 MG PO TABS: 100.0000 mg | ORAL_TABLET | Freq: Every day | ORAL | Status: DC

## 2011-06-29 ENCOUNTER — Other Ambulatory Visit (INDEPENDENT_AMBULATORY_CARE_PROVIDER_SITE_OTHER): Payer: Self-pay

## 2011-06-29 DIAGNOSIS — Z113 Encounter for screening for infections with a predominantly sexual mode of transmission: Secondary | ICD-10-CM

## 2011-06-29 DIAGNOSIS — B20 Human immunodeficiency virus [HIV] disease: Secondary | ICD-10-CM

## 2011-06-29 DIAGNOSIS — Z79899 Other long term (current) drug therapy: Secondary | ICD-10-CM

## 2011-06-29 LAB — COMPREHENSIVE METABOLIC PANEL
AST: 18 U/L (ref 0–37)
Alkaline Phosphatase: 81 U/L (ref 39–117)
Glucose, Bld: 105 mg/dL — ABNORMAL HIGH (ref 70–99)
Sodium: 139 mEq/L (ref 135–145)
Total Bilirubin: 2.4 mg/dL — ABNORMAL HIGH (ref 0.3–1.2)
Total Protein: 6.3 g/dL (ref 6.0–8.3)

## 2011-06-29 LAB — LIPID PANEL
HDL: 45 mg/dL (ref >39)
LDL Cholesterol: 71 mg/dL (ref 0–99)
Triglycerides: 81 mg/dL (ref <150)
VLDL: 16 mg/dL (ref 0–40)

## 2011-06-29 LAB — CBC
Hemoglobin: 11.4 g/dL — ABNORMAL LOW (ref 13.0–17.0)
MCHC: 35.4 g/dL (ref 30.0–36.0)
RDW: 13.4 % (ref 11.5–15.5)

## 2011-06-30 LAB — RPR

## 2011-06-30 LAB — HIV-1 RNA ULTRAQUANT REFLEX TO GENTYP+: HIV 1 RNA Quant: 23 copies/mL — ABNORMAL HIGH (ref <20)

## 2011-07-21 ENCOUNTER — Ambulatory Visit: Payer: Self-pay | Admitting: Infectious Diseases

## 2011-08-07 ENCOUNTER — Ambulatory Visit (INDEPENDENT_AMBULATORY_CARE_PROVIDER_SITE_OTHER): Payer: Self-pay | Admitting: Infectious Diseases

## 2011-08-07 ENCOUNTER — Encounter: Payer: Self-pay | Admitting: Infectious Diseases

## 2011-08-07 VITALS — BP 105/66 | HR 58 | Temp 97.6°F | Ht 74.0 in | Wt 139.0 lb

## 2011-08-07 DIAGNOSIS — C210 Malignant neoplasm of anus, unspecified: Secondary | ICD-10-CM

## 2011-08-07 DIAGNOSIS — K029 Dental caries, unspecified: Secondary | ICD-10-CM

## 2011-08-07 DIAGNOSIS — Z113 Encounter for screening for infections with a predominantly sexual mode of transmission: Secondary | ICD-10-CM

## 2011-08-07 DIAGNOSIS — Z79899 Other long term (current) drug therapy: Secondary | ICD-10-CM

## 2011-08-07 DIAGNOSIS — B20 Human immunodeficiency virus [HIV] disease: Secondary | ICD-10-CM

## 2011-08-07 NOTE — Assessment & Plan Note (Signed)
He has multiple exposed roots, reffered to dental.

## 2011-08-07 NOTE — Progress Notes (Signed)
  Subjective:    Patient ID: [name] [name], male    DOB: 1960-03-11, 51 y.o.   MRN: [id number]  HPI 51 yo M with HIV/AIDS, tobacco use, and anal CA (2008). Has been doing well. No problems with meds- ATVr/TFV/CBV (prev been on EFV, KLT), bactrim.   HIV 1 RNA Quant (copies/mL)  Date Value  06/29/2011 23*  08/22/2010 197*  03/16/2010 68*     CD4 T Cell Abs (cmm)  Date Value  06/29/2011 180*  08/22/2010 190*  03/16/2010 190*      Review of Systems Eating well, normal BM, increased gas, normal urination. Erections are less firm than previous. Still has hematoma/swelling in scrotum from previous bleeding with hernia repair.     Objective:   Physical Exam  Constitutional: He appears well-developed and well-nourished.  HENT:  Mouth/Throat: No oropharyngeal exudate.  Eyes: EOM are normal. Pupils are equal, round, and reactive to light.  Neck: Neck supple.  Cardiovascular: Normal rate, regular rhythm and normal heart sounds.   Pulmonary/Chest: Effort normal and breath sounds normal.  Abdominal: Soft. Bowel sounds are normal. He exhibits no distension. There is no tenderness.  Musculoskeletal: He exhibits no edema.  Lymphadenopathy:    He has no cervical adenopathy.          Assessment & Plan:

## 2011-08-07 NOTE — Assessment & Plan Note (Signed)
He's doing very well, although not quite undetectable and CD4 still not over 200. Will continue his meds, continue bactrim. . Will see him back in 6 months. Offered/refused condoms. Will check a testosterone as he is interested in this after seeing a TV add.

## 2011-08-07 NOTE — Assessment & Plan Note (Signed)
He is doing well, states he was nearly released by Dr Truett Perna but pt asked to be seen for another year. He still has some occas bleeding.

## 2011-08-11 ENCOUNTER — Ambulatory Visit: Payer: Self-pay

## 2011-11-23 ENCOUNTER — Other Ambulatory Visit: Payer: Self-pay | Admitting: *Deleted

## 2011-11-23 DIAGNOSIS — B20 Human immunodeficiency virus [HIV] disease: Secondary | ICD-10-CM

## 2011-11-23 MED ORDER — LAMIVUDINE-ZIDOVUDINE 150-300 MG PO TABS: 1.0000 | ORAL_TABLET | Freq: Two times a day (BID) | ORAL | Status: DC

## 2011-11-23 MED ORDER — RITONAVIR 100 MG PO TABS: 100.0000 mg | ORAL_TABLET | Freq: Every day | ORAL | Status: DC

## 2011-11-23 MED ORDER — SULFAMETHOXAZOLE-TRIMETHOPRIM 800-160 MG PO TABS: 1.0000 | ORAL_TABLET | ORAL | Status: DC

## 2011-11-23 MED ORDER — TENOFOVIR DISOPROXIL FUMARATE 300 MG PO TABS: 300.0000 mg | ORAL_TABLET | Freq: Every day | ORAL | Status: DC

## 2011-11-24 ENCOUNTER — Other Ambulatory Visit: Payer: Self-pay | Admitting: Infectious Diseases

## 2011-12-26 ENCOUNTER — Other Ambulatory Visit: Payer: Self-pay | Admitting: Infectious Diseases

## 2012-01-22 ENCOUNTER — Other Ambulatory Visit: Payer: Self-pay | Admitting: Infectious Diseases

## 2012-02-13 ENCOUNTER — Encounter: Payer: Self-pay | Admitting: Infectious Diseases

## 2012-02-22 ENCOUNTER — Other Ambulatory Visit: Payer: Self-pay | Admitting: Infectious Diseases

## 2012-03-07 ENCOUNTER — Ambulatory Visit: Payer: Self-pay

## 2012-03-27 ENCOUNTER — Other Ambulatory Visit: Payer: Self-pay | Admitting: Infectious Diseases

## 2012-05-03 ENCOUNTER — Other Ambulatory Visit: Payer: Self-pay | Admitting: *Deleted

## 2012-05-03 DIAGNOSIS — B20 Human immunodeficiency virus [HIV] disease: Secondary | ICD-10-CM

## 2012-05-03 MED ORDER — ATAZANAVIR SULFATE 300 MG PO CAPS: ORAL_CAPSULE | ORAL | Status: DC

## 2012-05-20 ENCOUNTER — Ambulatory Visit: Payer: Self-pay | Admitting: Oncology

## 2012-05-20 ENCOUNTER — Other Ambulatory Visit: Payer: Self-pay | Admitting: *Deleted

## 2012-05-20 NOTE — Progress Notes (Signed)
[facility] for 1 year follow up appointment. POF to scheduler to reschedule for next 1-2 months.

## 2012-05-21 ENCOUNTER — Telehealth: Payer: Self-pay | Admitting: Oncology

## 2012-05-21 NOTE — Telephone Encounter (Signed)
alked to pt and gave him appt for July 2014 and May 2015

## 2012-07-08 ENCOUNTER — Telehealth: Payer: Self-pay | Admitting: Oncology

## 2012-07-08 NOTE — Telephone Encounter (Signed)
Talked to pt appt moved to 8/8/ from 7/14 per MD

## 2012-07-09 ENCOUNTER — Other Ambulatory Visit: Payer: Self-pay

## 2012-07-11 ENCOUNTER — Other Ambulatory Visit (INDEPENDENT_AMBULATORY_CARE_PROVIDER_SITE_OTHER): Payer: Self-pay

## 2012-07-11 DIAGNOSIS — Z113 Encounter for screening for infections with a predominantly sexual mode of transmission: Secondary | ICD-10-CM

## 2012-07-11 DIAGNOSIS — B20 Human immunodeficiency virus [HIV] disease: Secondary | ICD-10-CM

## 2012-07-11 DIAGNOSIS — Z79899 Other long term (current) drug therapy: Secondary | ICD-10-CM

## 2012-07-11 LAB — COMPREHENSIVE METABOLIC PANEL
ALT: 11 U/L (ref 0–53)
AST: 19 U/L (ref 0–37)
Albumin: 4.2 g/dL (ref 3.5–5.2)
CO2: 27 mEq/L (ref 19–32)
Calcium: 9.1 mg/dL (ref 8.4–10.5)
Chloride: 104 mEq/L (ref 96–112)
Potassium: 4.4 mEq/L (ref 3.5–5.3)
Sodium: 138 mEq/L (ref 135–145)
Total Protein: 6.8 g/dL (ref 6.0–8.3)

## 2012-07-11 LAB — LIPID PANEL
Cholesterol: 141 mg/dL (ref 0–200)
Total CHOL/HDL Ratio: 2.5 Ratio

## 2012-07-11 LAB — CBC
HCT: 34.3 % — ABNORMAL LOW (ref 39.0–52.0)
MCV: 114.3 fL — ABNORMAL HIGH (ref 78.0–100.0)
RBC: 3 MIL/uL — ABNORMAL LOW (ref 4.22–5.81)
WBC: 4.5 10*3/uL (ref 4.0–10.5)

## 2012-07-12 LAB — RPR

## 2012-07-15 LAB — T-HELPER CELL (CD4) - (RCID CLINIC ONLY)
CD4 % Helper T Cell: 16 % — ABNORMAL LOW (ref 33–55)
CD4 T Cell Abs: 240 uL — ABNORMAL LOW (ref 400–2700)

## 2012-07-22 ENCOUNTER — Ambulatory Visit: Payer: Self-pay | Admitting: Oncology

## 2012-07-26 ENCOUNTER — Ambulatory Visit: Payer: Self-pay | Admitting: Infectious Diseases

## 2012-08-12 ENCOUNTER — Ambulatory Visit: Payer: Self-pay | Admitting: Infectious Diseases

## 2012-08-12 ENCOUNTER — Ambulatory Visit: Payer: Self-pay

## 2012-08-14 ENCOUNTER — Other Ambulatory Visit: Payer: Self-pay | Admitting: *Deleted

## 2012-08-15 ENCOUNTER — Telehealth: Payer: Self-pay | Admitting: Oncology

## 2012-08-15 NOTE — Telephone Encounter (Signed)
Per 8/6 pof moved appt 1 month out. Due to BS next availabilty is beyond one month out ok w/LT per Almira Coaster. S/w pt today re new appt for 8/25 @ 2:15pm.

## 2012-08-16 ENCOUNTER — Ambulatory Visit: Payer: Self-pay | Admitting: Oncology

## 2012-09-02 ENCOUNTER — Telehealth: Payer: Self-pay

## 2012-09-02 ENCOUNTER — Ambulatory Visit (HOSPITAL_BASED_OUTPATIENT_CLINIC_OR_DEPARTMENT_OTHER): Payer: Self-pay | Admitting: Nurse Practitioner

## 2012-09-02 VITALS — BP 110/67 | HR 64 | Temp 97.4°F | Resp 18 | Ht 74.0 in | Wt 140.8 lb

## 2012-09-02 DIAGNOSIS — C210 Malignant neoplasm of anus, unspecified: Secondary | ICD-10-CM

## 2012-09-02 DIAGNOSIS — Z86718 Personal history of other venous thrombosis and embolism: Secondary | ICD-10-CM

## 2012-09-02 DIAGNOSIS — B20 Human immunodeficiency virus [HIV] disease: Secondary | ICD-10-CM

## 2012-09-02 DIAGNOSIS — Z85048 Personal history of other malignant neoplasm of rectum, rectosigmoid junction, and anus: Secondary | ICD-10-CM

## 2012-09-02 NOTE — Telephone Encounter (Signed)
gv and printed appt sched and avs for pt....pt did not want to keep May appt

## 2012-09-02 NOTE — Progress Notes (Addendum)
OFFICE PROGRESS NOTE  Interval history:  [name] [name] returns as scheduled. Bowels overall moving regularly. He occasionally notes blood with wiping. Stools are intermittently "flat". No pain with bowel movements. No nausea or vomiting. He has a good appetite.   Objective: Blood pressure 110/67, pulse 64, temperature 97.4 F (36.3 C), temperature source Oral, resp. rate 18, height 6\' 2"  (1.88 m), weight 140 lb 12.8 oz (63.866 kg).  No thrush. No palpable cervical, subclavicular, axillary or inguinal lymph nodes. Lungs are clear. Regular cardiac rhythm. Abdomen soft and nontender. No organomegaly. Inguinal surgical defect at the right lower abdomen. Legs without edema. Persistent extensive fibrosis at the perineum, groin and pubic area. The upper gluteal fold region appears friable. Examination of the anus limited due to fibrosis of the gluteal space.  Lab Results: Lab Results  Component Value Date   WBC 4.5 07/11/2012   HGB 12.1* 07/11/2012   HCT 34.3* 07/11/2012   MCV 114.3* 07/11/2012   PLT 172 07/11/2012    Chemistry:    Chemistry      Component Value Date/Time   NA 138 07/11/2012 1501   K 4.4 07/11/2012 1501   CL 104 07/11/2012 1501   CO2 27 07/11/2012 1501   BUN 10 07/11/2012 1501   CREATININE 1.13 07/11/2012 1501   CREATININE 1.10 03/16/2010 1720      Component Value Date/Time   CALCIUM 9.1 07/11/2012 1501   ALKPHOS 71 07/11/2012 1501   AST 19 07/11/2012 1501   ALT 11 07/11/2012 1501   BILITOT 2.5* 07/11/2012 1501       Studies/Results: No results found.  Medications: I have reviewed the patient's current medications.  Assessment/Plan:  1. Anal cancer diagnosed in April 2008 status post concurrent chemotherapy and radiation. He is status post a negative perianal skin biopsy, November 27, 2006. 2. Status post bilateral inguinal hernia repair, January 08, 2007. 3. Hematoma following the inguinal hernia repair status post evacuation of the hematoma, January 15, 2007. The right lower quadrant  wound healed by secondary intention, and there is a remaining wound defect in the right lower abdomen. 4. History of left lower extremity deep venous thrombosis. He is maintained off of Coumadin. 5. History of thrombocytopenia secondary to chemotherapy, radiation, polypharmacy and human immunodeficiency virus infection. 6. History of anemia secondary to surgery, the right groin hematoma and human immunodeficiency virus infection. 7. Human immunodeficiency virus infection followed by Dr. Ninetta Lights. 8. History of oral candidiasis. He no longer takes Diflucan. 9. History of anorexia/weight loss. Stable today.  10. History of hematuria, status post a cystoscopy revealing urethral stricture, status post a dilatation procedure by Dr. Vernie Ammons. 11. Extensive perineal/groin radiation fibrosis and scarring. No clinical evidence for local recurrence of cancer.  Disposition-[name] [name] remains in clinical remission from the anal cancer. He will return for a followup visit in one year. He will contact the office in the interim if he notes any changes at the perineum/perianal region.  Patient seen with Dr. Truett Perna.  Lonna Cobb ANP/GNP-BC   This was a shared visit with Lonna Cobb. [name] [name] was interviewed and examined.  There is scarring with abrasions/friability at the perineum. No discrete nodule or mass. I suspect the perineal changes are related to radiation.  He will contact us if he develops a palpable change.  [name] [name] will return for an office visit in one year. He will see Dr. Ninetta Lights in the interim.

## 2012-10-02 ENCOUNTER — Ambulatory Visit (INDEPENDENT_AMBULATORY_CARE_PROVIDER_SITE_OTHER): Payer: Self-pay | Admitting: Infectious Diseases

## 2012-10-02 ENCOUNTER — Encounter: Payer: Self-pay | Admitting: Infectious Diseases

## 2012-10-02 ENCOUNTER — Ambulatory Visit: Payer: Self-pay

## 2012-10-02 VITALS — BP 123/82 | HR 73 | Temp 98.2°F | Ht 74.0 in | Wt 139.0 lb

## 2012-10-02 DIAGNOSIS — K029 Dental caries, unspecified: Secondary | ICD-10-CM

## 2012-10-02 DIAGNOSIS — B20 Human immunodeficiency virus [HIV] disease: Secondary | ICD-10-CM

## 2012-10-02 DIAGNOSIS — Z79899 Other long term (current) drug therapy: Secondary | ICD-10-CM

## 2012-10-02 DIAGNOSIS — Z23 Encounter for immunization: Secondary | ICD-10-CM

## 2012-10-02 DIAGNOSIS — C210 Malignant neoplasm of anus, unspecified: Secondary | ICD-10-CM

## 2012-10-02 DIAGNOSIS — Z113 Encounter for screening for infections with a predominantly sexual mode of transmission: Secondary | ICD-10-CM

## 2012-10-02 NOTE — Progress Notes (Signed)
  Subjective:    Patient ID: [name] [name], male    DOB: 13-May-1960, 52 y.o.   MRN: [id number]  HPI 52 yo M with HIV/AIDS, tobacco use, and anal CA (2008). Has been doing well. No problems with meds- ATVr/TFV/CBV (prev been on EFV, KLT), bactrim.  Has been having stress as his mom has become demented (followed by [facility]).  Still waiting on dental.    HIV 1 RNA Quant (copies/mL)  Date Value  07/11/2012 <20   06/29/2011 23*  08/22/2010 197*     CD4 T Cell Abs (cmm)  Date Value  07/11/2012 240*  06/29/2011 180*  08/22/2010 190*    Review of Systems  Constitutional: Negative for appetite change and unexpected weight change.  Gastrointestinal: Negative for diarrhea and constipation.  Genitourinary: Negative for dysuria.       Objective:   Physical Exam  Constitutional: He appears well-developed and well-nourished.  HENT:  Mouth/Throat: No oropharyngeal exudate.  Eyes: EOM are normal. Pupils are equal, round, and reactive to light.  Neck: Neck supple.  Cardiovascular: Normal rate, regular rhythm and normal heart sounds.   Pulmonary/Chest: Effort normal and breath sounds normal.  Abdominal: Soft. Bowel sounds are normal. He exhibits no distension. There is no tenderness.  Musculoskeletal: He exhibits no edema.  Lymphadenopathy:    He has no cervical adenopathy.          Assessment & Plan:

## 2012-10-02 NOTE — Assessment & Plan Note (Signed)
Doing well, will cont his current meds. Flu shot today. rtc in 6 months.

## 2012-10-02 NOTE — Assessment & Plan Note (Signed)
My great appreciation to Onc for continued f/u. Has some abn of BM (shape) and constipation, probably due to anatomic changes post surgery.

## 2012-10-02 NOTE — Assessment & Plan Note (Signed)
Will f/u with dental to get appt.

## 2012-10-29 ENCOUNTER — Other Ambulatory Visit: Payer: Self-pay | Admitting: *Deleted

## 2012-10-29 DIAGNOSIS — B20 Human immunodeficiency virus [HIV] disease: Secondary | ICD-10-CM

## 2012-10-29 MED ORDER — LAMIVUDINE-ZIDOVUDINE 150-300 MG PO TABS: 1.0000 | ORAL_TABLET | Freq: Two times a day (BID) | ORAL | Status: DC

## 2012-10-29 MED ORDER — RITONAVIR 100 MG PO TABS: 100.0000 mg | ORAL_TABLET | Freq: Every day | ORAL | Status: DC

## 2012-10-29 MED ORDER — TENOFOVIR DISOPROXIL FUMARATE 300 MG PO TABS: 300.0000 mg | ORAL_TABLET | Freq: Every day | ORAL | Status: DC

## 2012-10-29 MED ORDER — ATAZANAVIR SULFATE 300 MG PO CAPS: ORAL_CAPSULE | ORAL | Status: DC

## 2012-12-24 ENCOUNTER — Encounter (HOSPITAL_COMMUNITY): Payer: Self-pay | Admitting: Emergency Medicine

## 2012-12-24 ENCOUNTER — Emergency Department (HOSPITAL_COMMUNITY)
Admission: EM | Admit: 2012-12-24 | Discharge: 2012-12-24 | Disposition: A | Discharge status: Home / Self Care | Payer: Self-pay | Attending: Emergency Medicine | Admitting: Emergency Medicine

## 2012-12-24 ENCOUNTER — Emergency Department (HOSPITAL_COMMUNITY): Payer: Self-pay

## 2012-12-24 DIAGNOSIS — R059 Cough, unspecified: Secondary | ICD-10-CM | POA: Insufficient documentation

## 2012-12-24 DIAGNOSIS — Z923 Personal history of irradiation: Secondary | ICD-10-CM | POA: Insufficient documentation

## 2012-12-24 DIAGNOSIS — I889 Nonspecific lymphadenitis, unspecified: Secondary | ICD-10-CM | POA: Insufficient documentation

## 2012-12-24 DIAGNOSIS — N390 Urinary tract infection, site not specified: Secondary | ICD-10-CM | POA: Insufficient documentation

## 2012-12-24 DIAGNOSIS — Z8619 Personal history of other infectious and parasitic diseases: Secondary | ICD-10-CM | POA: Insufficient documentation

## 2012-12-24 DIAGNOSIS — F17200 Nicotine dependence, unspecified, uncomplicated: Secondary | ICD-10-CM | POA: Insufficient documentation

## 2012-12-24 DIAGNOSIS — R55 Syncope and collapse: Secondary | ICD-10-CM | POA: Insufficient documentation

## 2012-12-24 DIAGNOSIS — Z21 Asymptomatic human immunodeficiency virus [HIV] infection status: Secondary | ICD-10-CM | POA: Insufficient documentation

## 2012-12-24 DIAGNOSIS — R05 Cough: Secondary | ICD-10-CM

## 2012-12-24 DIAGNOSIS — Z85828 Personal history of other malignant neoplasm of skin: Secondary | ICD-10-CM | POA: Insufficient documentation

## 2012-12-24 DIAGNOSIS — Z79899 Other long term (current) drug therapy: Secondary | ICD-10-CM | POA: Insufficient documentation

## 2012-12-24 DIAGNOSIS — R109 Unspecified abdominal pain: Secondary | ICD-10-CM | POA: Insufficient documentation

## 2012-12-24 DIAGNOSIS — R1030 Lower abdominal pain, unspecified: Secondary | ICD-10-CM

## 2012-12-24 HISTORY — DX: Zoster without complications: B02.9

## 2012-12-24 HISTORY — DX: Reserved for concepts with insufficient information to code with codable children: IMO0002

## 2012-12-24 HISTORY — DX: Reserved for inherently not codable concepts without codable children: IMO0001

## 2012-12-24 HISTORY — DX: Human immunodeficiency virus (HIV) disease: B20

## 2012-12-24 HISTORY — DX: Asymptomatic human immunodeficiency virus (hiv) infection status: Z21

## 2012-12-24 LAB — URINALYSIS, ROUTINE W REFLEX MICROSCOPIC
Ketones, ur: NEGATIVE mg/dL
Nitrite: NEGATIVE
Specific Gravity, Urine: 1.03 (ref 1.005–1.030)
Urobilinogen, UA: 0.2 mg/dL (ref 0.0–1.0)

## 2012-12-24 LAB — CBC
HCT: 35.1 % — ABNORMAL LOW (ref 39.0–52.0)
MCHC: 35.3 g/dL (ref 30.0–36.0)
MCV: 111.1 fL — ABNORMAL HIGH (ref 78.0–100.0)
Platelets: 141 10*3/uL — ABNORMAL LOW (ref 150–400)
RBC: 3.16 MIL/uL — ABNORMAL LOW (ref 4.22–5.81)
RDW: 13.6 % (ref 11.5–15.5)
WBC: 13 10*3/uL — ABNORMAL HIGH (ref 4.0–10.5)

## 2012-12-24 LAB — BASIC METABOLIC PANEL
BUN: 11 mg/dL (ref 6–23)
CO2: 24 mEq/L (ref 19–32)
Chloride: 101 mEq/L (ref 96–112)
Creatinine, Ser: 1.12 mg/dL (ref 0.50–1.35)
GFR calc Af Amer: 87 mL/min — ABNORMAL LOW (ref >90)
GFR calc non Af Amer: 75 mL/min — ABNORMAL LOW (ref >90)
Potassium: 4 mEq/L (ref 3.5–5.1)
Sodium: 134 mEq/L — ABNORMAL LOW (ref 135–145)

## 2012-12-24 LAB — URINE MICROSCOPIC-ADD ON

## 2012-12-24 MED ORDER — BENZONATATE 100 MG PO CAPS
100.0000 mg | ORAL_CAPSULE | Freq: Three times a day (TID) | ORAL | Status: DC
Start: 2012-12-24 — End: 2013-01-13

## 2012-12-24 MED ORDER — AZITHROMYCIN 250 MG PO TABS: 250.0000 mg | ORAL_TABLET | Freq: Every day | ORAL | Status: DC

## 2012-12-24 MED ORDER — ALBUTEROL SULFATE HFA 108 (90 BASE) MCG/ACT IN AERS
1.0000 | INHALATION_SPRAY | RESPIRATORY_TRACT | Status: DC | PRN
  Administered 2012-12-24: 2 via RESPIRATORY_TRACT
  Filled 2012-12-24: qty 6.7

## 2012-12-24 MED ORDER — HYDROCODONE-ACETAMINOPHEN 5-325 MG PO TABS: 1.0000 | ORAL_TABLET | Freq: Four times a day (QID) | ORAL | Status: DC | PRN

## 2012-12-24 MED ORDER — SODIUM CHLORIDE 0.9 % IV BOLUS (SEPSIS)
1000.0000 mL | Freq: Once | INTRAVENOUS | Status: AC
  Administered 2012-12-24: 1000 mL via INTRAVENOUS

## 2012-12-24 MED ORDER — CEPHALEXIN 500 MG PO CAPS: 500.0000 mg | ORAL_CAPSULE | Freq: Four times a day (QID) | ORAL | Status: DC

## 2012-12-24 NOTE — ED Provider Notes (Signed)
CSN: [id number]     Arrival date & time 12/24/12  4098 History   First MD Initiated Contact with Patient 12/24/12 2049     Chief Complaint  Patient presents with  . Groin Pain   (Consider location/radiation/quality/duration/timing/severity/associated sxs/prior Treatment) HPI Comments: States groin pain feels like a pulled muscle. No injury he can remember.  Patient is a 52 y.o. male presenting with groin pain. The history is provided by the patient.  Groin Pain This is a new problem. The current episode started 12 to 24 hours ago. The problem occurs constantly. The problem has not changed since onset.Pertinent negatives include no chest pain, no abdominal pain, no headaches and no shortness of breath. Nothing aggravates the symptoms. Nothing relieves the symptoms.    Past Medical History  Diagnosis Date  . Skin cancer   . Radiation   . HIV (human immunodeficiency virus infection)   . Shingles    Past Surgical History  Procedure Laterality Date  . Hernia repair    . Hematoma evacuation     Family History  Problem Relation Age of Onset  . Diabetes Mother   . Dementia Mother   . Atrial fibrillation Mother   . Hypertension Mother   . Cancer Father   . Emphysema Father    History  Substance Use Topics  . Smoking status: Current Every Day Smoker -- 0.30 packs/day    Types: Cigarettes  . Smokeless tobacco: Former Neurosurgeon  . Alcohol Use: 1.5 oz/week    3 drink(s) per week     Comment: occasional    Review of Systems  Constitutional: Negative for fever.  Respiratory: Negative for shortness of breath.   Cardiovascular: Negative for chest pain.  Gastrointestinal: Negative for vomiting and abdominal pain.  Genitourinary: Negative for dysuria, hematuria, discharge, penile swelling, scrotal swelling, penile pain and testicular pain.  Neurological: Negative for headaches.  All other systems reviewed and are negative.    Allergies  Review of patient's allergies indicates no  known allergies.  Home Medications   Current Outpatient Rx  Name  Route  Sig  Dispense  Refill  . atazanavir (REYATAZ) 300 MG capsule      TAKE ONE CAPSULE BY MOUTH EVERY MORNING WITH BREAKFAST   30 capsule   6   . dextromethorphan (DELSYM) 30 MG/5ML liquid   Oral   Take 60 mg by mouth as needed for cough (cough).         . lamiVUDine-zidovudine (COMBIVIR) 150-300 MG per tablet   Oral   Take 1 tablet by mouth 2 (two) times daily.   60 tablet   6   . ritonavir (NORVIR) 100 MG TABS tablet   Oral   Take 1 tablet (100 mg total) by mouth daily.   30 tablet   6   . tenofovir (VIREAD) 300 MG tablet   Oral   Take 1 tablet (300 mg total) by mouth daily.   30 tablet   6    BP 123/76  Pulse 98  Temp(Src) 99.2 F (37.3 C) (Oral)  Resp 22  Ht 6\' 2"  (1.88 m)  Wt 145 lb (65.772 kg)  BMI 18.61 kg/m2  SpO2 100% Physical Exam  Nursing note and vitals reviewed. Constitutional: He is oriented to person, place, and time. He appears well-developed and well-nourished. No distress.  HENT:  Head: Normocephalic and atraumatic.  Mouth/Throat: No oropharyngeal exudate.  Eyes: EOM are normal. Pupils are equal, round, and reactive to light.  Neck: Normal range of motion.  Neck supple.  Cardiovascular: Normal rate and regular rhythm.  Exam reveals no friction rub.   No murmur heard. Pulmonary/Chest: Effort normal and breath sounds normal. No respiratory distress. He has no wheezes. He has no rales.  Abdominal: He exhibits no distension. There is no tenderness. There is no rebound.  Genitourinary:     Musculoskeletal: Normal range of motion. He exhibits no edema.  Neurological: He is alert and oriented to person, place, and time. No cranial nerve deficit. He exhibits normal muscle tone. Coordination normal.  Skin: No rash noted. He is not diaphoretic.    ED Course  Procedures (including critical care time) Labs Review Labs Reviewed  CBC - Abnormal; Notable for the following:     WBC 13.0 (*)    RBC 3.16 (*)    Hemoglobin 12.4 (*)    HCT 35.1 (*)    MCV 111.1 (*)    MCH 39.2 (*)    Platelets 141 (*)    All other components within normal limits  BASIC METABOLIC PANEL - Abnormal; Notable for the following:    Sodium 134 (*)    Glucose, Bld 104 (*)    GFR calc non Af Amer 75 (*)    GFR calc Af Amer 87 (*)    All other components within normal limits  URINALYSIS, ROUTINE W REFLEX MICROSCOPIC - Abnormal; Notable for the following:    Color, Urine AMBER (*)    Glucose, UA 100 (*)    Bilirubin Urine SMALL (*)    Protein, ur 30 (*)    Leukocytes, UA TRACE (*)    All other components within normal limits  URINE MICROSCOPIC-ADD ON - Abnormal; Notable for the following:    Casts HYALINE CASTS (*)    All other components within normal limits   Imaging Review Dg Chest 2 View  12/24/2012   CLINICAL DATA:  Shortness of breath.  Cough.  Smoker.  EXAM: CHEST  2 VIEW  COMPARISON:  04/26/2006  FINDINGS: Pulmonary hyperinflation again seen, consistent with COPD. No evidence of pulmonary infiltrate or pulmonary edema. No evidence of pleural effusion. Heart size is within normal limits. No mass or lymphadenopathy identified.  IMPRESSION: COPD.  No active disease.   Electronically Signed   By: Myles Rosenthal M.D.   On: 12/24/2012 21:59    EKG Interpretation    Date/Time:  Tuesday December 24 2012 21:59:12 EST Ventricular Rate:  68 PR Interval:  137 QRS Duration: 109 QT Interval:  393 QTC Calculation: 418 R Axis:   89 Text Interpretation:  Sinus rhythm RSR' in V1 or V2, right VCD or RVH Confirmed by Gwendolyn Grant  MD, Sameeha Rockefeller ([id number]) on 12/24/2012 10:01:16 PM            MDM   1. Lymphadenitis   2. UTI (urinary tract infection)   3. Groin pain   4. Near syncope   5. Cough    52 year old male presents with right groin pain. He believes this is a pulled muscle, but cannot recall any injury that caused this. He denies any fever, dysuria, testicular or penile pain. He denies  any nausea, vomiting, diarrhea, abdominal pain. Began today has been constant. Worse with walking and movement. Medical history includes HIV, otherwise no major issues. Patient also reported a near-syncopal is early for his walking work and became very flushed diaphoretic. He denied any chest pain or shortness breath this time. Never had this before. He felt better after drinking some more juice and sitting down. Is not  currently at this time. He does not have any cardiac issues. Here vitals are stable. His right groin is a small swollen area on the medial side. It is not erythematous nor is it consistent with cellulitis, abscess. I do not feel this is a reactive lymph node, however due to swelling is difficult to tell. His GU exam is otherwise normal. His abdominal exam is normal. EKG is normal. Basic labs show mild white count and trace leukocytes in his urine. I will place him on Keflex which should cover for reactive lymphadenopathy and a urinary tract infection in this patient. CXR clear, will give Z-pack for bronchitis symptoms for past month.  Patient to f/u with his PCP. Stable for discharge     Dagmar Hait, MD 12/24/12 2312

## 2012-12-24 NOTE — ED Notes (Signed)
EKG given to EDP,Walden,MD. For review. 

## 2012-12-24 NOTE — ED Notes (Signed)
Pt states this morning when he got up he had pain in his right groin area  Pt has a raised area about 3 inches long and about 1 inch wide noted  Pt states it is tender to palpate and states it is painful to walk

## 2012-12-30 ENCOUNTER — Ambulatory Visit: Payer: Self-pay | Admitting: Infectious Disease

## 2012-12-31 ENCOUNTER — Telehealth: Payer: Self-pay | Admitting: *Deleted

## 2012-12-31 ENCOUNTER — Ambulatory Visit: Payer: Self-pay | Admitting: Internal Medicine

## 2012-12-31 NOTE — Telephone Encounter (Signed)
rescheduled for 1/5/ w/ Dr. Ninetta Lights

## 2013-01-09 DIAGNOSIS — Z8619 Personal history of other infectious and parasitic diseases: Secondary | ICD-10-CM

## 2013-01-09 HISTORY — DX: Personal history of other infectious and parasitic diseases: Z86.19

## 2013-01-13 ENCOUNTER — Ambulatory Visit (INDEPENDENT_AMBULATORY_CARE_PROVIDER_SITE_OTHER): Payer: No Typology Code available for payment source | Admitting: Infectious Diseases

## 2013-01-13 ENCOUNTER — Encounter: Payer: Self-pay | Admitting: Infectious Diseases

## 2013-01-13 VITALS — BP 111/68 | HR 60 | Temp 97.9°F | Ht 74.0 in | Wt 136.0 lb

## 2013-01-13 DIAGNOSIS — Z113 Encounter for screening for infections with a predominantly sexual mode of transmission: Secondary | ICD-10-CM

## 2013-01-13 DIAGNOSIS — R599 Enlarged lymph nodes, unspecified: Secondary | ICD-10-CM | POA: Insufficient documentation

## 2013-01-13 DIAGNOSIS — B20 Human immunodeficiency virus [HIV] disease: Secondary | ICD-10-CM

## 2013-01-13 NOTE — Assessment & Plan Note (Addendum)
Will have him seen by Dr Dalbert Batman. I am concerned that this could be a recurrence of his previous anal CA. He wishes to defer appt til Feb when his insurance starts. Would like to get him Bx (if appropriate) as soon as we can.

## 2013-01-13 NOTE — Progress Notes (Signed)
   Subjective:    Patient ID: [name] [name], male    DOB: 1960-02-04, 53 y.o.   MRN: [id number]  HPI 53 yo M with HIV/AIDS, tobacco use, and anal CA (2008). Has been doing well. No problems with meds- ATVr/TFV/CBV (prev been on EFV, KLT), bactrim.  Was seen in Ed 12-15 with LAN in his groin. Had BMP normal and UA (0-2 WBC). Got z-pack for bronchitis, also given keflex for UTI.  LN has decreased some but incompletely. Has difficulty with pain in his R thigh with putting on his pants or socks (any bending of his leg).   HIV 1 RNA Quant (copies/mL)  Date Value  07/11/2012 <20   06/29/2011 23*  08/22/2010 197*     CD4 T Cell Abs (cmm)  Date Value  07/11/2012 240*  06/29/2011 180*  08/22/2010 190*    Review of Systems  Constitutional: Negative for fever, chills and unexpected weight change.  Gastrointestinal: Negative for constipation.  Genitourinary: Negative for dysuria, frequency and hematuria.  no new partners (not active in "long time").      Objective:   Physical Exam  Constitutional: He appears well-developed and well-nourished.  HENT:  Mouth/Throat: No oropharyngeal exudate.  Eyes: EOM are normal. Pupils are equal, round, and reactive to light.  Neck: Neck supple.  Cardiovascular: Normal rate, regular rhythm and normal heart sounds.   Pulmonary/Chest: Effort normal and breath sounds normal.  Abdominal: Soft. Bowel sounds are normal. He exhibits no distension. There is no tenderness.  Genitourinary:     Lymphadenopathy:    He has no cervical adenopathy.          Assessment & Plan:

## 2013-01-13 NOTE — Assessment & Plan Note (Signed)
Has f/u labs and appt in February.

## 2013-01-14 LAB — T-HELPER CELL (CD4) - (RCID CLINIC ONLY)
CD4 % Helper T Cell: 23 % — ABNORMAL LOW (ref 33–55)
CD4 T Cell Abs: 200 /uL — ABNORMAL LOW (ref 400–2700)

## 2013-01-14 LAB — RPR

## 2013-01-15 LAB — HIV-1 RNA ULTRAQUANT REFLEX TO GENTYP+
HIV 1 RNA Quant: 29 copies/mL — ABNORMAL HIGH (ref <20)
HIV-1 RNA Quant, Log: 1.46 {Log} — ABNORMAL HIGH (ref <1.30)

## 2013-02-18 ENCOUNTER — Ambulatory Visit (INDEPENDENT_AMBULATORY_CARE_PROVIDER_SITE_OTHER): Payer: No Typology Code available for payment source | Admitting: General Surgery

## 2013-02-18 ENCOUNTER — Encounter (INDEPENDENT_AMBULATORY_CARE_PROVIDER_SITE_OTHER): Payer: Self-pay | Admitting: General Surgery

## 2013-02-18 VITALS — BP 110/70 | HR 71 | Temp 98.5°F | Resp 16 | Ht 74.0 in | Wt 136.0 lb

## 2013-02-18 DIAGNOSIS — C210 Malignant neoplasm of anus, unspecified: Secondary | ICD-10-CM

## 2013-02-18 DIAGNOSIS — R599 Enlarged lymph nodes, unspecified: Secondary | ICD-10-CM

## 2013-02-18 DIAGNOSIS — R590 Localized enlarged lymph nodes: Secondary | ICD-10-CM | POA: Insufficient documentation

## 2013-02-18 NOTE — Patient Instructions (Signed)
The swelling in your right groin appears to be a chronic change, and I am not sure whether this is enlarged lymph nodes, cancer, or some other benign process. It might be related to your radiation therapy. It might be related to your hernia surgery.  I am concerned that a surgical procedure in that location will heal poorly due to the past history of radiation therapy and your compromised immune system.  We are going to postpone any consideration of surgical biopsy until get a CT scan of the abdomen and pelvis to better define the nature of the swelling.  Return to see Dr. Dalbert Batman after the CT scan is done.

## 2013-02-18 NOTE — Progress Notes (Signed)
Patient ID: [name] [name], male   DOB: 06-23-1960, 53 y.o.   MRN: [id number]  No chief complaint on file.   HPI [name] [name] is a 53 y.o. male.  He is referred back to me after a several year absence by [name]. I requested to evaluate right thigh pain and right groin swelling.  This gentleman has significant number of comorbidities. He has a history of hepatitis B. History of HIV arm for antiviral medications. 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 He had a negative perianal skin biopsy 6 months later. Right inguinal hernia repair December 2008 propagated by hematoma and subsequent healing by secondary intention. 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 History of significant weight loss.  With that background he gives a 6-8 week history of intermittent right anterior thigh pain that does not extend below the knee. Causation is unclear. It also noticed some swelling just below the right inguinal crease although no skin change. He's been seen in the emergency department and was referred back to [name]. [name] saw him and wondered if this was lymphadenopathy.  The patient states that otherwise his health he has had a severe and fairly stable. He still has [name] [name] is with her fairly flat. He states that Dr. Benay Spice is going to refer him for his first colonoscopy [name] [name] in August of this year. He is here today with his sister who lives in [place]  HPI  Past Medical History  Diagnosis Date  . Skin cancer   . Radiation   . HIV (human immunodeficiency virus infection)   . Shingles     Past Surgical History  Procedure Laterality Date  . Hernia repair    . Hematoma evacuation      Family History  Problem Relation Age of Onset  . Diabetes Mother   . Dementia Mother   . Atrial fibrillation Mother   . Hypertension Mother   .  Cancer Father   . Emphysema Father     Social History History  Substance Use Topics  . Smoking status: Current Every Day Smoker -- 0.30 packs/day    Types: Cigarettes  . Smokeless tobacco: Former Systems developer  . Alcohol Use: 1.5 oz/week    3 drink(s) per week     Comment: occasional    No Known Allergies  Current Outpatient Prescriptions  Medication Sig Dispense Refill  . atazanavir (REYATAZ) 300 MG capsule TAKE ONE CAPSULE BY MOUTH EVERY MORNING WITH BREAKFAST  30 capsule  6  . lamiVUDine-zidovudine (COMBIVIR) 150-300 MG per tablet Take 1 tablet by mouth 2 (two) times daily.  60 tablet  6  . tenofovir (VIREAD) 300 MG tablet Take 1 tablet (300 mg total) by mouth daily.  30 tablet  6  . ritonavir (NORVIR) 100 MG TABS tablet Take 1 tablet (100 mg total) by mouth daily.  30 tablet  6   No current facility-administered medications for this visit.    Review of Systems Review of Systems  Constitutional: Positive for fatigue and unexpected weight change. Negative for fever and chills.  HENT: Negative for congestion, hearing loss, sore throat, trouble swallowing and voice change.   Eyes: Negative for visual disturbance.  Respiratory: Negative for cough and wheezing.   Cardiovascular: Negative for chest pain, palpitations and leg swelling.  Gastrointestinal: Positive for diarrhea and rectal pain. Negative for nausea, vomiting, abdominal pain, constipation, blood in stool,  abdominal distention and anal bleeding.  Genitourinary: Negative for hematuria and difficulty urinating.  Musculoskeletal: Positive for myalgias. Negative for arthralgias.  Skin: Negative for rash and wound.  Neurological: Negative for seizures, syncope, weakness and headaches.  Hematological: Negative for adenopathy. Does not bruise/bleed easily.  Psychiatric/Behavioral: Negative for confusion.    Blood pressure 110/70, pulse 71, temperature 98.5 F (36.9 C), temperature source Temporal, resp. rate 16, height 6\' 2"  (1.88  m), weight 136 lb (61.689 kg).  Physical Exam Physical Exam  Constitutional: He is oriented to person, place, and time. He appears well-developed and well-nourished. No distress.  HENT:  Head: Normocephalic.  Nose: Nose normal.  Mouth/Throat: No oropharyngeal exudate.  Eyes: Conjunctivae and EOM are normal. Pupils are equal, round, and reactive to light. Right eye exhibits no discharge. Left eye exhibits no discharge. No scleral icterus.  Neck: Normal range of motion. Neck supple. No JVD present. No tracheal deviation present. No thyromegaly present.  No cervical adenopathy  Cardiovascular: Normal rate, regular rhythm, normal heart sounds and intact distal pulses.   No murmur heard. Pulmonary/Chest: Effort normal and breath sounds normal. No stridor. No respiratory distress. He has no wheezes. He has no rales. He exhibits no tenderness.  No axillary adenopathy  Abdominal: Soft. Bowel sounds are normal. He exhibits no distension and no mass. There is no tenderness. There is no rebound and no guarding.  Abdomen soft. Midline incision well healed no hernia.  Genitourinary:  Significant chronic skin thickening and radiation changes both in the inguinal, pubic, genitalia, and perianal area. Significant chronic skin changes perianal but no obvious cancer. Right infrainguinal inguinal area feels fuller than the left but there is no erythema and no significant tenderness. Essentially fixed.This is more of a diffuse thickening and not a discrete adenopathy.  Musculoskeletal: Normal range of motion. He exhibits no edema and no tenderness.  Lymphadenopathy:    He has no cervical adenopathy.  Neurological: He is alert and oriented to person, place, and time. He has normal reflexes. Coordination normal.  Skin: Skin is warm and dry. No rash noted. He is not diaphoretic. No erythema. No pallor.  Psychiatric: He has a normal mood and affect. His behavior is normal. Judgment and thought content normal.     Data Reviewed I reviewed all of my old records, Dr. Gearldine Shown records, ED records, and Dr. Algis Downs recent note.  Assessment    Swelling of right infrainguinal area of uncertain etiology. I cannot tell if this is chronic radiation change, tissue edema, or  pathologic adenopathy. I agree with [name] that further evaluation is warranted. I am concerned that any surgical procedure here will heal poorly because of his immune compromised state and prior radiation therapy.   History of squamous cell carcinoma of the anus, Status post good response to primary chemotherapy and radiation therapy 2008.  NED otherwise.  History of right inguinal hernia repair, palpated by hematoma and edema secondary intention. No evidence of recurrent hernia  HIV on four antivirals  Hepatitis B  History DVT left leg, maintained off Coumadin.  History of anorexia and weight loss.  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  Return to see me in one to 2 weeks after CT scan is done the we will decide whether there is any indication for open biopsy or image guided biopsy at  that time.        Edsel Petrin. Dalbert Batman, M.D., [facility] General and Minimally invasive Surgery Breast and Colorectal Surgery Office:   [phone] Pager:   [phone]  02/18/2013, 2:34 PM

## 2013-02-20 ENCOUNTER — Telehealth (INDEPENDENT_AMBULATORY_CARE_PROVIDER_SITE_OTHER): Payer: Self-pay | Admitting: *Deleted

## 2013-02-20 NOTE — Telephone Encounter (Signed)
Pt called in wanting his CT appt changed from GI to [facility] due to his insurance.  I scheduled him an appt at [facility] for his CT on 02/24/13 with an arrival time of 10:15am.  I instructed him to have NO solid foods 2 hours prior.  He is to go by their office and pick up the contrast tomorrow.  He will drink the 1st bottle the night before the scan at bedtime and the 2nd bottle 1 hour prior to his scan.  I instructed him to be sure and get the disc with the images and the report from his CT scan.  I also informed the scheduler of this and she will place a note with the pts appt.  He is agreeable with this appt.

## 2013-02-24 ENCOUNTER — Other Ambulatory Visit: Payer: Self-pay

## 2013-03-06 ENCOUNTER — Encounter (INDEPENDENT_AMBULATORY_CARE_PROVIDER_SITE_OTHER): Payer: No Typology Code available for payment source | Admitting: General Surgery

## 2013-03-10 ENCOUNTER — Telehealth (INDEPENDENT_AMBULATORY_CARE_PROVIDER_SITE_OTHER): Payer: Self-pay

## 2013-03-10 NOTE — Telephone Encounter (Signed)
Pt notified of Ct findings per Dr Darrel Hoover request. Pt states he will return to work and if area becomes more painful or if it changes in appearance or feel the pt will call to be re examined. I advised pt if he decides he wants to speak with Dr Dalbert Batman to call and we can have Dr Dalbert Batman go over any concerns. Pt states he understands.

## 2013-03-10 NOTE — Telephone Encounter (Signed)
Pt calling to be sure that Dr Dalbert Batman has received the CT cd and report. I reviewed this with Deanna and pt advised Dr Darrel Hoover nurse does have Cd and will give them to Dr Dalbert Batman this afternoon to review. They will call pt with recommendation once imaging has been reviewed. Pt can be reached at [phone].

## 2013-03-10 NOTE — Telephone Encounter (Signed)
Tell [name] [name] that the CT scan shows some postoperative changes and some radiation therapy changes, but no evidence of cancer.  I will be happy to discuss this with him by phone or in person if he desires. Just let me know.  hmi

## 2013-03-13 ENCOUNTER — Encounter (INDEPENDENT_AMBULATORY_CARE_PROVIDER_SITE_OTHER): Payer: No Typology Code available for payment source | Admitting: General Surgery

## 2013-03-26 ENCOUNTER — Other Ambulatory Visit: Payer: Self-pay

## 2013-03-28 ENCOUNTER — Other Ambulatory Visit: Payer: No Typology Code available for payment source

## 2013-03-28 DIAGNOSIS — Z79899 Other long term (current) drug therapy: Secondary | ICD-10-CM

## 2013-03-28 DIAGNOSIS — Z113 Encounter for screening for infections with a predominantly sexual mode of transmission: Secondary | ICD-10-CM

## 2013-03-28 DIAGNOSIS — B20 Human immunodeficiency virus [HIV] disease: Secondary | ICD-10-CM

## 2013-03-28 LAB — LIPID PANEL
CHOL/HDL RATIO: 2.6 ratio
CHOLESTEROL: 142 mg/dL (ref 0–200)
HDL: 54 mg/dL (ref >39)
LDL Cholesterol: 74 mg/dL (ref 0–99)
TRIGLYCERIDES: 70 mg/dL (ref <150)
VLDL: 14 mg/dL (ref 0–40)

## 2013-03-28 LAB — CBC
HCT: 34.3 % — ABNORMAL LOW (ref 39.0–52.0)
HEMOGLOBIN: 11.7 g/dL — AB (ref 13.0–17.0)
MCH: 36.8 pg — AB (ref 26.0–34.0)
MCHC: 34.1 g/dL (ref 30.0–36.0)
MCV: 107.9 fL — AB (ref 78.0–100.0)
Platelets: 158 10*3/uL (ref 150–400)
RBC: 3.18 MIL/uL — ABNORMAL LOW (ref 4.22–5.81)
RDW: 15.3 % (ref 11.5–15.5)
WBC: 4.7 10*3/uL (ref 4.0–10.5)

## 2013-03-28 LAB — COMPREHENSIVE METABOLIC PANEL
ALK PHOS: 64 U/L (ref 39–117)
ALT: 11 U/L (ref 0–53)
AST: 16 U/L (ref 0–37)
Albumin: 3.8 g/dL (ref 3.5–5.2)
BILIRUBIN TOTAL: 1.3 mg/dL — AB (ref 0.2–1.2)
BUN: 11 mg/dL (ref 6–23)
CO2: 27 meq/L (ref 19–32)
CREATININE: 1.1 mg/dL (ref 0.50–1.35)
Calcium: 8.8 mg/dL (ref 8.4–10.5)
Chloride: 106 mEq/L (ref 96–112)
GLUCOSE: 99 mg/dL (ref 70–99)
Potassium: 3.7 mEq/L (ref 3.5–5.3)
SODIUM: 139 meq/L (ref 135–145)
Total Protein: 6.5 g/dL (ref 6.0–8.3)

## 2013-03-28 LAB — T-HELPER CELL (CD4) - (RCID CLINIC ONLY)
CD4 % Helper T Cell: 18 % — ABNORMAL LOW (ref 33–55)
CD4 T Cell Abs: 230 /uL — ABNORMAL LOW (ref 400–2700)

## 2013-03-29 LAB — RPR

## 2013-03-31 LAB — HIV-1 RNA QUANT-NO REFLEX-BLD
HIV 1 RNA Quant: 252 copies/mL — ABNORMAL HIGH (ref <20)
HIV-1 RNA Quant, Log: 2.4 {Log} — ABNORMAL HIGH (ref <1.30)

## 2013-04-01 ENCOUNTER — Telehealth: Payer: Self-pay | Admitting: *Deleted

## 2013-04-01 NOTE — Telephone Encounter (Signed)
Message copied by Georgena Spurling on Tue Apr 01, 2013 12:04 PM ------      Message from: HATCHER, JEFFREY C      Created: Mon Mar 31, 2013  5:22 PM       Pt needs appt in the next 4-6 weeks            ----- Message -----         From: [facility] [place]: 03/28/2013   1:39 PM           To: [name], MD                   ------

## 2013-04-01 NOTE — Telephone Encounter (Signed)
Patient's phone number not in service. Left voice mail with sister, [name], emergency contact to please ask the patient to call the clinic to schedule appt with [name] in the next 4-6 weeks. Myrtis Hopping

## 2013-04-09 ENCOUNTER — Ambulatory Visit: Payer: Self-pay | Admitting: Infectious Diseases

## 2013-04-29 ENCOUNTER — Encounter (INDEPENDENT_AMBULATORY_CARE_PROVIDER_SITE_OTHER): Payer: Self-pay

## 2013-05-07 ENCOUNTER — Ambulatory Visit: Payer: Self-pay | Admitting: Infectious Diseases

## 2013-05-20 ENCOUNTER — Ambulatory Visit: Payer: Self-pay | Admitting: Oncology

## 2013-07-08 ENCOUNTER — Telehealth: Payer: Self-pay | Admitting: *Deleted

## 2013-07-08 NOTE — Telephone Encounter (Signed)
Called patient and left a voice mail for him to call the clinic to schedule a follow up appointment. He should be given Dr. Algis Downs first available.

## 2013-07-09 ENCOUNTER — Other Ambulatory Visit (INDEPENDENT_AMBULATORY_CARE_PROVIDER_SITE_OTHER): Payer: Self-pay

## 2013-07-09 ENCOUNTER — Encounter (INDEPENDENT_AMBULATORY_CARE_PROVIDER_SITE_OTHER): Payer: Self-pay | Admitting: General Surgery

## 2013-07-09 ENCOUNTER — Ambulatory Visit (INDEPENDENT_AMBULATORY_CARE_PROVIDER_SITE_OTHER): Payer: No Typology Code available for payment source | Admitting: General Surgery

## 2013-07-09 VITALS — BP 100/60 | HR 86 | Temp 97.9°F | Resp 16 | Ht 73.5 in | Wt 131.2 lb

## 2013-07-09 DIAGNOSIS — M25561 Pain in right knee: Secondary | ICD-10-CM

## 2013-07-09 DIAGNOSIS — M79603 Pain in arm, unspecified: Secondary | ICD-10-CM

## 2013-07-09 DIAGNOSIS — M25569 Pain in unspecified knee: Secondary | ICD-10-CM

## 2013-07-09 DIAGNOSIS — C210 Malignant neoplasm of anus, unspecified: Secondary | ICD-10-CM

## 2013-07-09 MED ORDER — HYDROCODONE-ACETAMINOPHEN 5-325 MG PO TABS: 1.0000 | ORAL_TABLET | Freq: Four times a day (QID) | ORAL | Status: DC | PRN

## 2013-07-09 NOTE — Progress Notes (Signed)
Patient ID: [name] [name], male   DOB: 25-Oct-1960, 53 y.o.   MRN: [id number] History: This gentleman return to see me stating that he has right leg pain for 2 weeks. He is always had some right groin pain from the radiation therapy he states he now has a two-week history of pain radiating down his right inner thigh, intermittently, especially when he tries to get out of bed. This is affecting his ambulation. He is here in a wheelchair but is able to stand. His brother-in-law is with him. Important medical history is summarized below: .  This gentleman has significant number of comorbidities. He has a history of hepatitis B. History of HIV arm for antiviral medications. 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 He had a negative perianal skin biopsy 6 months later. Right inguinal hernia repair December 1660 complicated by hematoma and subsequent healing by secondary intention. 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 History of significant weight loss.  He was evaluated this February with a 6-8 week history of intermittent right anterior thigh pain that does not extend below the knee. Causation is unclear. It also noticed some swelling just below the right inguinal crease although no skin change. I performed a CT scan and he had no evidence of recurrent cancer, infection, adenopathy, or hernia. The patient states that otherwise his health  fairly stable.  He states that Dr. Benay Spice is going to refer him for his first colonoscopy.  He is due to see Dr. Benay Spice in August of this year. An appointment with [name] this springwas canceled and he knows he needs to reschedule this summer.  Past history, family history, social history, review of systems are documented on the chart, unchanged, and noncontributory except as described above.  Exam:  Alert. Pleasant. In a  wheelchair. Very thin. No distress. Lungs clear to auscultation Abdomen scaphoid. Soft. Nontender.Midline incision well healed. No hernia Genitourinary chronic skin thickening and radiation changes in both inguinal pubic genitalia and perianal areas. No sign of recurrent cancer. No skin breakdown. Chronic edema. No sign of any infection. Musculoskeletal he can stand without too much difficulty but he has some discomfort we tried to fully extend his knee.  Assessment: Right leg pain of recent onset. This sounds like a radiculopathy. Possibly due to chronic nerve entrapment from radiation change. Possibly due to lumbosacral nerve compression. I am not sure Swelling of right infrainguinal area of uncertain etiology. Most likely this is chronic radiation change And chronic fixed edema.. There is no evidence of recurrent hernia, infection, or pathologic adenopathy. History of squamous cell carcinoma of the anus, Status post good response to primary chemotherapy and radiation therapy 2008. NED otherwise.  History of right inguinal hernia repair, complicated by hematoma and healing by secondary intention. No evidence of recurrent hernia  HIV on four antivirals  Hepatitis B  History DVT left leg, maintained off Coumadin.  History of anorexia and weight loss.  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 Benay Spice in August Call Dr. Lita Mains and make an appointment to see him this summer Return to see me if surgical issues arise   Edsel Petrin. Dalbert Batman, M.D., [facility] General and Minimally invasive Surgery Breast and Colorectal Surgery Office:   [phone] Pager:  [phone]  

## 2013-07-09 NOTE — Patient Instructions (Signed)
We have discussed your right leg pain, inner thigh which radiates down to the knee. You stated this has been present for 2 weeks, especially when you tried to get out of bed, and it is affecting your ability to walk.  You examination today shows no evidence of cancer, infection, or hernia.  This pain may be due to the radiation therapy or there may be a problem with a pinched nerve from arthritis.  You will be referred to a neurologist.  Keep your appt. with Dr. Benay Spice in August.  Call [name] and be sure to see him this summer.

## 2013-07-19 ENCOUNTER — Emergency Department (HOSPITAL_COMMUNITY)
Admission: EM | Admit: 2013-07-19 | Discharge: 2013-07-19 | Disposition: A | Discharge status: Home / Self Care | Payer: No Typology Code available for payment source | Attending: Emergency Medicine | Admitting: Emergency Medicine

## 2013-07-19 ENCOUNTER — Encounter (HOSPITAL_COMMUNITY): Payer: Self-pay | Admitting: Emergency Medicine

## 2013-07-19 DIAGNOSIS — Z9889 Other specified postprocedural states: Secondary | ICD-10-CM | POA: Diagnosis not present

## 2013-07-19 DIAGNOSIS — Z8619 Personal history of other infectious and parasitic diseases: Secondary | ICD-10-CM | POA: Diagnosis not present

## 2013-07-19 DIAGNOSIS — F17200 Nicotine dependence, unspecified, uncomplicated: Secondary | ICD-10-CM | POA: Insufficient documentation

## 2013-07-19 DIAGNOSIS — Z79899 Other long term (current) drug therapy: Secondary | ICD-10-CM | POA: Insufficient documentation

## 2013-07-19 DIAGNOSIS — G629 Polyneuropathy, unspecified: Secondary | ICD-10-CM

## 2013-07-19 DIAGNOSIS — Z21 Asymptomatic human immunodeficiency virus [HIV] infection status: Secondary | ICD-10-CM | POA: Diagnosis not present

## 2013-07-19 DIAGNOSIS — Z85828 Personal history of other malignant neoplasm of skin: Secondary | ICD-10-CM | POA: Insufficient documentation

## 2013-07-19 DIAGNOSIS — M79609 Pain in unspecified limb: Secondary | ICD-10-CM | POA: Diagnosis present

## 2013-07-19 DIAGNOSIS — G5780 Other specified mononeuropathies of unspecified lower limb: Secondary | ICD-10-CM | POA: Diagnosis not present

## 2013-07-19 MED ORDER — ONDANSETRON HCL 4 MG PO TABS: 4.0000 mg | ORAL_TABLET | Freq: Four times a day (QID) | ORAL | Status: DC

## 2013-07-19 MED ORDER — DIAZEPAM 5 MG/ML IJ SOLN
5.0000 mg | Freq: Once | INTRAMUSCULAR | Status: AC
  Administered 2013-07-19: 5 mg via INTRAMUSCULAR
  Filled 2013-07-19: qty 2

## 2013-07-19 MED ORDER — GABAPENTIN 100 MG PO CAPS: 100.0000 mg | ORAL_CAPSULE | Freq: Three times a day (TID) | ORAL | Status: DC

## 2013-07-19 MED ORDER — METHOCARBAMOL 500 MG PO TABS: 500.0000 mg | ORAL_TABLET | Freq: Two times a day (BID) | ORAL | Status: DC

## 2013-07-19 NOTE — Discharge Instructions (Signed)

## 2013-07-19 NOTE — ED Notes (Addendum)
Pt reports R groin pain since mid-June. Hx of same 6 months ago that resolved itself. Pain will shoot either across to hip or down leg. Feels like a nerve pain. Reports urinary incontinence for past week. Reports increasing weakness with pain. Unable to ambulate in last week due to pain and tremors. 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

## 2013-07-19 NOTE — ED Notes (Signed)
MD at bedside. 

## 2013-07-19 NOTE — ED Provider Notes (Signed)
Medical screening examination/treatment/procedure(s) were conducted as a shared visit with non-physician practitioner(s) and myself.  I personally evaluated the patient during the encounter.  Pt c/o chronic left groin pain. No acute or abrupt change. No swelling or redness. Had been eval by pcp, ID doc, and gen surgery for same, including ct.  No swelling. Fem and distal pulses palp. No skin changes or erythema. Good rom in hip without pain. No incarc hernia.    Mirna Mires, MD 07/19/13 [phone]

## 2013-07-19 NOTE — ED Provider Notes (Signed)
CSN: [id number]     Arrival date & time 07/19/13  1224 History   First MD Initiated Contact with Patient 07/19/13 1243     Chief Complaint  Patient presents with  . Groin Pain  . Leg Pain     (Consider location/radiation/quality/duration/timing/severity/associated sxs/prior Treatment) HPI Comments: Patient presents to the emergency department with chief complaints of right sided leg pain. He states that he experiences a shooting sensation down his right leg, that feels like an electrical shock. States that the symptoms have been ongoing for the past couple of days. He was seen by his surgeon, evaluated for hernia. He states that he was referred to neurology by his surgeon. The symptoms are worsened with movement. Symptoms are improved with rest. Patient states she has been unable to ambulate because of the pain. He had tried taking hydrocodone with good relief.  The history is provided by the patient. No language interpreter was used.    Past Medical History  Diagnosis Date  . Skin cancer   . Radiation   . HIV (human immunodeficiency virus infection)   . Shingles    Past Surgical History  Procedure Laterality Date  . Hernia repair    . Hematoma evacuation     Family History  Problem Relation Age of Onset  . Diabetes Mother   . Dementia Mother   . Atrial fibrillation Mother   . Hypertension Mother   . Cancer Father   . Emphysema Father    History  Substance Use Topics  . Smoking status: Current Every Day Smoker -- 0.30 packs/day    Types: Cigarettes  . Smokeless tobacco: Former Systems developer  . Alcohol Use: 1.5 oz/week    3 drink(s) per week     Comment: occasional    Review of Systems  All other systems reviewed and are negative.     Allergies  Review of patient's allergies indicates no known allergies.  Home Medications   Prior to Admission medications   Medication Sig Start Date End Date Taking? Authorizing Provider  acetaminophen (TYLENOL) 500 MG tablet Take  1,000 mg by mouth every 6 (six) hours as needed for mild pain or moderate pain.    Yes Historical Provider, MD  atazanavir (REYATAZ) 300 MG capsule Take 300 mg by mouth daily with breakfast.   Yes Historical Provider, MD  HYDROcodone-acetaminophen (NORCO) 5-325 MG per tablet Take 1 tablet by mouth every 6 (six) hours as needed for moderate pain. 07/09/13  Yes Adin Hector, MD  lamiVUDine-zidovudine (COMBIVIR) 150-300 MG per tablet Take 1 tablet by mouth 2 (two) times daily. 10/29/12  Yes [name], MD  ritonavir (NORVIR) 100 MG TABS tablet Take 1 tablet (100 mg total) by mouth daily. 10/29/12  Yes [name], MD  tenofovir (VIREAD) 300 MG tablet Take 1 tablet (300 mg total) by mouth daily. 10/29/12  Yes [name], MD   BP 123/75  Pulse 73  Temp(Src) 98 F (36.7 C)  Resp 18  SpO2 100% Physical Exam  Nursing note and vitals reviewed. Constitutional: He is oriented to person, place, and time. He appears well-developed.  Chronically ill-appearing  HENT:  Head: Normocephalic and atraumatic.  Eyes: Conjunctivae and EOM are normal. Pupils are equal, round, and reactive to light. Right eye exhibits no discharge. Left eye exhibits no discharge. No scleral icterus.  Neck: Normal range of motion. Neck supple. No JVD present.  Cardiovascular: Normal rate, regular rhythm and normal heart sounds.  Exam reveals no gallop and  no friction rub.   No murmur heard. Pulmonary/Chest: Effort normal and breath sounds normal. No respiratory distress. He has no wheezes. He has no rales. He exhibits no tenderness.  Abdominal: Soft. He exhibits no distension and no mass. There is no tenderness. There is no rebound and no guarding.  No palpable hernia  Musculoskeletal: Normal range of motion. He exhibits no edema and no tenderness.  Moves all extremities  Range of motion of right-sided hip limited secondary to pain  Neurological: He is alert and oriented to person, place, and time.   Sensation intact  Skin: Skin is warm and dry.  Psychiatric: He has a normal mood and affect. His behavior is normal. Judgment and thought content normal.    ED Course  Procedures (including critical care time) Labs Review Labs Reviewed - No data to display  Imaging Review No results found.   EKG Interpretation None      MDM   Final diagnoses:  Neuropathy    Patient with right leg pain. States he gets a shooting electrical sensation in his right leg. Has been intermittent, but recently started up again.  Neurovascularly intact. Has a neurology appointment on Wednesday. Would like something additional to help control his symptoms. Norco is beneficial.  And Valium given in the ED with good improvement. Will prescribe Robaxin. Patient also inquires about gabapentin. Will prescribe this as well. [place] home in good condition. Recommend neurology followup.  Patient seen by and discussed with Dr. Ashok Cordia, who agrees with plan.   Montine Circle, PA-C 07/19/13 1451

## 2013-07-19 NOTE — ED Notes (Signed)
Bed: WA06 Expected date:  Expected time:  Means of arrival:  Comments: Patient coming from triage

## 2013-07-23 ENCOUNTER — Encounter: Payer: Self-pay | Admitting: Neurology

## 2013-07-23 ENCOUNTER — Ambulatory Visit (INDEPENDENT_AMBULATORY_CARE_PROVIDER_SITE_OTHER): Payer: No Typology Code available for payment source | Admitting: Neurology

## 2013-07-23 VITALS — BP 81/58 | HR 98 | Ht 73.0 in | Wt 132.0 lb

## 2013-07-23 DIAGNOSIS — M79609 Pain in unspecified limb: Secondary | ICD-10-CM

## 2013-07-23 DIAGNOSIS — M79604 Pain in right leg: Secondary | ICD-10-CM

## 2013-07-23 HISTORY — DX: Pain in unspecified limb: M79.609

## 2013-07-23 MED ORDER — HYDROMORPHONE HCL 2 MG PO TABS: 2.0000 mg | ORAL_TABLET | ORAL | Status: DC | PRN

## 2013-07-23 NOTE — Progress Notes (Signed)
Reason for visit: Right leg pain  [name] [name] is a 53 y.o. male  History of present illness:  [name] [name] is a 53 year old right-handed white male with a history of HIV infection and a prior squamous cell carcinoma of the anus status post radiation therapy in 2008. The patient indicates that he began having some right hip and groin pain in December 2014 that was noted upon awakening. Initially felt that he had pulled a muscle. By February 2015, the pain was much improved. He had a CT scan of the abdomen and pelvis around that time that did show some edematous changes in the pelvic wall that were felt to be related to radiation changes. No recurrence of tumor was seen. The patient seemed to do quite well until 06/26/2013. The pain recurred in the same area, but was much more severe. The pain currently is in the right hip, and going to the right groin area with occasional radiation in to the medial right thigh down to the knee. The patient occasionally will have some numbness in the right big toe. He currently indicates that he has lost his ability to walk within the last week or 2 secondary to severe pain. The patient is unable to straighten out the knee because the pain is quite severe. The patient is unable to bear weight secondary to pain. The patient denies any back pain. The patient denies any issues with the left leg, neck, or arms. He describes the pain as an electric shock sensation at times. He denies any issues controlling the bowels or the bladder. He is sent to this office for an evaluation. The patient went to the emergency room, but he was not admitted for further evaluation, no scans or x-rays were done.  Past Medical History  Diagnosis Date  . Radiation   . HIV (human immunodeficiency virus infection)   . Shingles     left shoulder, right leg  . Pain in limb 07/23/2013  . Rectal cancer     Squamous cell  . Gout     Past Surgical History  Procedure Laterality Date  .  Hernia repair      right  . Hematoma evacuation      Family History  Problem Relation Age of Onset  . Diabetes Mother   . Dementia Mother   . Atrial fibrillation Mother   . Hypertension Mother   . Cancer Father   . Emphysema Father     Social history:  reports that he has been smoking Cigarettes.  He has been smoking about 0.30 packs per day. He has never used smokeless tobacco. He reports that he drinks about 1.5 ounces of alcohol per week. He reports that he uses illicit drugs (Marijuana).  Medications:  Current Outpatient Prescriptions on File Prior to Visit  Medication Sig Dispense Refill  . acetaminophen (TYLENOL) 500 MG tablet Take 1,000 mg by mouth every 6 (six) hours as needed for mild pain or moderate pain.       Marland Kitchen atazanavir (REYATAZ) 300 MG capsule Take 300 mg by mouth daily with breakfast.      . gabapentin (NEURONTIN) 100 MG capsule Take 1 capsule (100 mg total) by mouth 3 (three) times daily.  30 capsule  0  . HYDROcodone-acetaminophen (NORCO) 5-325 MG per tablet Take 1 tablet by mouth every 6 (six) hours as needed for moderate pain.  30 tablet  0  . lamiVUDine-zidovudine (COMBIVIR) 150-300 MG per tablet Take 1 tablet by mouth 2 (  two) times daily.  60 tablet  6  . methocarbamol (ROBAXIN) 500 MG tablet Take 1 tablet (500 mg total) by mouth 2 (two) times daily.  20 tablet  0  . ondansetron (ZOFRAN) 4 MG tablet Take 1 tablet (4 mg total) by mouth every 6 (six) hours.  12 tablet  0  . ritonavir (NORVIR) 100 MG TABS tablet Take 1 tablet (100 mg total) by mouth daily.  30 tablet  6  . tenofovir (VIREAD) 300 MG tablet Take 1 tablet (300 mg total) by mouth daily.  30 tablet  6   No current facility-administered medications on file prior to visit.     No Known Allergies  ROS:  Out of a complete 14 system review of symptoms, the patient complains only of the following symptoms, and all other reviewed systems are negative.  Weight loss, fatigue Leg weakness  Blood  pressure 81/58, pulse 98, height 6\' 1"  (1.854 m), weight 132 lb (59.875 kg).  Physical Exam  General: The patient is alert and cooperative at the time of the examination.  Eyes: Pupils are equal, round, and reactive to light. Discs are flat bilaterally.  Neck: The neck is supple, no carotid bruits are noted.  Respiratory: The respiratory examination is clear.  Cardiovascular: The cardiovascular examination reveals a regular rate and rhythm, no obvious murmurs or rubs are noted.  Skin: Extremities are without significant edema.  Neurologic Exam  Mental status: The patient is alert and oriented x 3 at the time of the examination. The patient has apparent normal recent and remote memory, with an apparently normal attention span and concentration ability.  Cranial nerves: Facial symmetry is present. There is good sensation of the face to pinprick and soft touch bilaterally. The strength of the facial muscles and the muscles to head turning and shoulder shrug are normal bilaterally. Speech is well enunciated, no aphasia or dysarthria is noted. Extraocular movements are full. Visual fields are full. The tongue is midline, and the patient has symmetric elevation of the soft palate. No obvious hearing deficits are noted.  Motor: The motor testing reveals 5 over 5 strength of all 4 extremities. The patient does have some giveaway weakness of the right leg, no true weakness is seen. Good symmetric motor tone is noted throughout.  Sensory: Sensory testing is intact to pinprick, soft touch, vibration sensation, and position sense on all 4 extremities, with exception that there may be a slight stocking pattern sensory deficit up to the knees bilaterally. No evidence of extinction is noted.  Coordination: Cerebellar testing reveals good finger-nose-finger bilaterally. The patient has difficulty performing heel shin with the right leg, can perform with the left. No ataxia seen.  Gait and station: Gait  could not be tested secondary to severe pain with any manipulation of the right leg.  Reflexes: Deep tendon reflexes are symmetric and normal bilaterally, with exception that the ankle jerk reflexes are slightly depressed bilaterally. Knee jerk reflexes are well-maintained bilaterally. Toes are downgoing bilaterally.   Assessment/Plan:  1. Right leg pain  Clinical examination today shows severe pain with manipulation of the right leg, particularly with anything that moves the hip joint. The patient is unable to bear weight on the right leg secondary to severe pain. Rotation at the hip induces severe pain. The examination does not show clear weakness, reflex asymmetry, or sensory alteration in either leg. The examination is most consistent with a hip joint pathology, and the patient will need to be evaluated for this issue.  The patient could potentially have avascular necrosis of the right hip. The patient will be sent for MRI of the pelvis with and without gadolinium, and he will have MRI of the right hip joint. The patient will followup through this office if needed, but if the above studies are unremarkable, he will return for EMG and nerve conduction study evaluation. He was given a prescription for Dilaudid for pain. He is on low-dose gabapentin without much benefit. A note was given to keep him out of work over the next 4 weeks.  Jill Alexanders MD 07/23/2013 7:44 PM  [facility] [address] [place], [place] [address]  Phone [phone] Fax [phone]

## 2013-07-25 ENCOUNTER — Telehealth: Payer: Self-pay | Admitting: Neurology

## 2013-07-25 NOTE — Telephone Encounter (Signed)
Patient is calling because he started taking Dilaudid this past Wednesday and since then has had the hiccups off and on--patient is wondering if this could this be from this medication?

## 2013-07-28 NOTE — Telephone Encounter (Signed)
Please advise previous note. Thanks  °

## 2013-07-28 NOTE — Telephone Encounter (Signed)
I called patient. The patient has had some hiccups and spitting on Dilaudid. He thinks that it may be related to trying to eat and drink while lying down flat. He is to have the MRI study done in 2 days, I will call him when the report is available to me.

## 2013-07-30 ENCOUNTER — Ambulatory Visit: Payer: No Typology Code available for payment source

## 2013-07-30 ENCOUNTER — Inpatient Hospital Stay (HOSPITAL_COMMUNITY)
Admission: EM | Admit: 2013-07-30 | Discharge: 2013-08-13 | DRG: 969 | Disposition: A | Discharge status: Other Acute Inpatient Hospital | Payer: No Typology Code available for payment source | Attending: Internal Medicine | Admitting: Internal Medicine

## 2013-07-30 ENCOUNTER — Encounter (HOSPITAL_COMMUNITY): Payer: Self-pay | Admitting: Emergency Medicine

## 2013-07-30 ENCOUNTER — Telehealth: Payer: Self-pay | Admitting: Neurology

## 2013-07-30 ENCOUNTER — Other Ambulatory Visit: Payer: Self-pay | Admitting: Neurology

## 2013-07-30 DIAGNOSIS — L8994 Pressure ulcer of unspecified site, stage 4: Secondary | ICD-10-CM | POA: Diagnosis present

## 2013-07-30 DIAGNOSIS — B20 Human immunodeficiency virus [HIV] disease: Secondary | ICD-10-CM | POA: Diagnosis present

## 2013-07-30 DIAGNOSIS — I82409 Acute embolism and thrombosis of unspecified deep veins of unspecified lower extremity: Secondary | ICD-10-CM

## 2013-07-30 DIAGNOSIS — L89154 Pressure ulcer of sacral region, stage 4: Secondary | ICD-10-CM | POA: Diagnosis present

## 2013-07-30 DIAGNOSIS — E876 Hypokalemia: Secondary | ICD-10-CM | POA: Diagnosis not present

## 2013-07-30 DIAGNOSIS — I9589 Other hypotension: Secondary | ICD-10-CM

## 2013-07-30 DIAGNOSIS — Z85048 Personal history of other malignant neoplasm of rectum, rectosigmoid junction, and anus: Secondary | ICD-10-CM

## 2013-07-30 DIAGNOSIS — I498 Other specified cardiac arrhythmias: Secondary | ICD-10-CM | POA: Diagnosis not present

## 2013-07-30 DIAGNOSIS — L89152 Pressure ulcer of sacral region, stage 2: Secondary | ICD-10-CM | POA: Diagnosis present

## 2013-07-30 DIAGNOSIS — Z2239 Carrier of other specified bacterial diseases: Secondary | ICD-10-CM

## 2013-07-30 DIAGNOSIS — A419 Sepsis, unspecified organism: Secondary | ICD-10-CM | POA: Diagnosis present

## 2013-07-30 DIAGNOSIS — D696 Thrombocytopenia, unspecified: Secondary | ICD-10-CM | POA: Diagnosis not present

## 2013-07-30 DIAGNOSIS — R64 Cachexia: Secondary | ICD-10-CM | POA: Diagnosis present

## 2013-07-30 DIAGNOSIS — M109 Gout, unspecified: Secondary | ICD-10-CM | POA: Diagnosis present

## 2013-07-30 DIAGNOSIS — K612 Anorectal abscess: Secondary | ICD-10-CM

## 2013-07-30 DIAGNOSIS — L89109 Pressure ulcer of unspecified part of back, unspecified stage: Secondary | ICD-10-CM | POA: Diagnosis present

## 2013-07-30 DIAGNOSIS — M869 Osteomyelitis, unspecified: Secondary | ICD-10-CM | POA: Diagnosis present

## 2013-07-30 DIAGNOSIS — M87051 Idiopathic aseptic necrosis of right femur: Secondary | ICD-10-CM

## 2013-07-30 DIAGNOSIS — R34 Anuria and oliguria: Secondary | ICD-10-CM | POA: Diagnosis not present

## 2013-07-30 DIAGNOSIS — B951 Streptococcus, group B, as the cause of diseases classified elsewhere: Secondary | ICD-10-CM | POA: Diagnosis present

## 2013-07-30 DIAGNOSIS — E43 Unspecified severe protein-calorie malnutrition: Secondary | ICD-10-CM | POA: Diagnosis present

## 2013-07-30 DIAGNOSIS — R599 Enlarged lymph nodes, unspecified: Secondary | ICD-10-CM

## 2013-07-30 DIAGNOSIS — F1210 Cannabis abuse, uncomplicated: Secondary | ICD-10-CM | POA: Diagnosis present

## 2013-07-30 DIAGNOSIS — R634 Abnormal weight loss: Secondary | ICD-10-CM | POA: Diagnosis present

## 2013-07-30 DIAGNOSIS — D638 Anemia in other chronic diseases classified elsewhere: Secondary | ICD-10-CM | POA: Diagnosis present

## 2013-07-30 DIAGNOSIS — F17200 Nicotine dependence, unspecified, uncomplicated: Secondary | ICD-10-CM | POA: Diagnosis present

## 2013-07-30 DIAGNOSIS — Z681 Body mass index (BMI) 19 or less, adult: Secondary | ICD-10-CM

## 2013-07-30 DIAGNOSIS — R6521 Severe sepsis with septic shock: Secondary | ICD-10-CM

## 2013-07-30 DIAGNOSIS — R6520 Severe sepsis without septic shock: Secondary | ICD-10-CM

## 2013-07-30 DIAGNOSIS — M009 Pyogenic arthritis, unspecified: Secondary | ICD-10-CM | POA: Diagnosis present

## 2013-07-30 DIAGNOSIS — D62 Acute posthemorrhagic anemia: Secondary | ICD-10-CM | POA: Diagnosis not present

## 2013-07-30 DIAGNOSIS — A409 Streptococcal sepsis, unspecified: Principal | ICD-10-CM | POA: Diagnosis present

## 2013-07-30 DIAGNOSIS — Z923 Personal history of irradiation: Secondary | ICD-10-CM

## 2013-07-30 DIAGNOSIS — K6812 Psoas muscle abscess: Secondary | ICD-10-CM | POA: Diagnosis present

## 2013-07-30 DIAGNOSIS — D539 Nutritional anemia, unspecified: Secondary | ICD-10-CM | POA: Diagnosis present

## 2013-07-30 DIAGNOSIS — Z86718 Personal history of other venous thrombosis and embolism: Secondary | ICD-10-CM

## 2013-07-30 DIAGNOSIS — R197 Diarrhea, unspecified: Secondary | ICD-10-CM

## 2013-07-30 LAB — I-STAT CG4 LACTIC ACID, ED: LACTIC ACID, VENOUS: 1.58 mmol/L (ref 0.5–2.2)

## 2013-07-30 MED ORDER — VANCOMYCIN HCL IN DEXTROSE 1-5 GM/200ML-% IV SOLN
1000.0000 mg | Freq: Once | INTRAVENOUS | Status: AC
  Administered 2013-07-31: 1000 mg via INTRAVENOUS
  Filled 2013-07-30: qty 200

## 2013-07-30 MED ORDER — HYDROMORPHONE HCL PF 1 MG/ML IJ SOLN
1.0000 mg | Freq: Once | INTRAMUSCULAR | Status: AC
  Administered 2013-07-31: 1 mg via INTRAVENOUS
  Filled 2013-07-30: qty 1

## 2013-07-30 MED ORDER — SODIUM CHLORIDE 0.9 % IV BOLUS (SEPSIS)
1000.0000 mL | Freq: Once | INTRAVENOUS | Status: AC
  Administered 2013-07-31: 1000 mL via INTRAVENOUS

## 2013-07-30 MED ORDER — SODIUM CHLORIDE 0.9 % IV BOLUS (SEPSIS)
1000.0000 mL | Freq: Once | INTRAVENOUS | Status: AC
  Administered 2013-07-30: 1000 mL via INTRAVENOUS

## 2013-07-30 NOTE — Telephone Encounter (Signed)
I called the patient, unable to reach him, I called his sister, and let her noted to be CT scan of a right hip shows evidence of an infection around the hip and tracking into the pelvis along the iliopsoas muscle. The patient will require admission to the hospital, I indicated that he should go to the emergency room tonight or tomorrow morning and seek medical attention.

## 2013-07-30 NOTE — ED Notes (Signed)
CG-4 reported to Dr. Cheri Guppy

## 2013-07-30 NOTE — ED Notes (Addendum)
Pt. reports right hip pain radiating to right groin / low grade fever onset today , CT scan results shows septic right hip joint taken at [facility] today advised by PCP to go to ER for admission .

## 2013-07-30 NOTE — ED Provider Notes (Signed)
CSN: [id number]     Arrival date & time 07/30/13  2312 History   First MD Initiated Contact with Patient 07/30/13 2331     Chief Complaint  Patient presents with  . Hip Pain     (Consider location/radiation/quality/duration/timing/severity/associated sxs/prior Treatment) HPI  This patient is a 53 yo man with HIV who is referred to the ED by Dr. Jannifer Franklin. The patient, per the patient's own report, had a CT of the right hip performed at [facility] this afternoon. Apparently, Dr. Jannifer Franklin, the ordering physician, received a call from [facility] alerting to diagnosis of right hip infection.   Patient has had 3 days of severe right hip pain which is worse with movements and ambulation. He has been unable to bear weight on the right leg. He is noted to be febrile to 101.56F in the ED. Patient was hypotensive in triage but, normotensive with normal pulse once brought back to resus B. patient denies any recent trauma to the right hip. He reports compliance with antiretroviral medications.  He is a generally poor over the last couple of days with diminished appetite and by mouth intake.  Patient describes his pain as sharp and aching, and is worse with movements of the right hip, it radiates in the right buttock region. The patient has not had any subjective motor weakness of the right leg.  Past Medical History  Diagnosis Date  . Radiation   . HIV (human immunodeficiency virus infection)   . Shingles     left shoulder, right leg  . Pain in limb 07/23/2013  . Rectal cancer     Squamous cell  . Gout    Past Surgical History  Procedure Laterality Date  . Hernia repair      right  . Hematoma evacuation     Family History  Problem Relation Age of Onset  . Diabetes Mother   . Dementia Mother   . Atrial fibrillation Mother   . Hypertension Mother   . Cancer Father   . Emphysema Father    History  Substance Use Topics  . Smoking status: Current Every Day Smoker -- 0.30 packs/day     Types: Cigarettes  . Smokeless tobacco: Never Used  . Alcohol Use: 1.5 oz/week    3 drink(s) per week     Comment: occasional    Review of Systems Ten point review of symptoms performed and is negative with the exception of symptoms noted above.    Allergies  Review of patient's allergies indicates no known allergies.  Home Medications   Prior to Admission medications   Medication Sig Start Date End Date Taking? Authorizing Provider  acetaminophen (TYLENOL) 500 MG tablet Take 1,000 mg by mouth every 6 (six) hours as needed for mild pain or moderate pain.     Historical Provider, MD  atazanavir (REYATAZ) 300 MG capsule Take 300 mg by mouth daily with breakfast.    Historical Provider, MD  gabapentin (NEURONTIN) 100 MG capsule Take 1 capsule (100 mg total) by mouth 3 (three) times daily. 07/19/13   Montine Circle, PA-C  HYDROcodone-acetaminophen (NORCO) 5-325 MG per tablet Take 1 tablet by mouth every 6 (six) hours as needed for moderate pain. 07/09/13   Adin Hector, MD  HYDROmorphone (DILAUDID) 2 MG tablet Take 1 tablet (2 mg total) by mouth every 4 (four) hours as needed for severe pain. 07/23/13   Kathrynn Ducking, MD  lamiVUDine-zidovudine (COMBIVIR) 150-300 MG per tablet Take 1 tablet by mouth 2 (two)  times daily. 10/29/12   [name], MD  methocarbamol (ROBAXIN) 500 MG tablet Take 1 tablet (500 mg total) by mouth 2 (two) times daily. 07/19/13   Montine Circle, PA-C  ondansetron (ZOFRAN) 4 MG tablet Take 1 tablet (4 mg total) by mouth every 6 (six) hours. 07/19/13   Montine Circle, PA-C  ritonavir (NORVIR) 100 MG TABS tablet Take 1 tablet (100 mg total) by mouth daily. 10/29/12   [name], MD  tenofovir (VIREAD) 300 MG tablet Take 1 tablet (300 mg total) by mouth daily. 10/29/12   [name], MD   BP 132/90  Pulse 106  Temp(Src) 101.2 F (38.4 C) (Rectal)  Resp 23  Ht 6' 1.5" (1.867 m)  Wt 132 lb (59.875 kg)  BMI 17.18 kg/m2  SpO2 100% Physical  Exam  Gen: well developed and well nourished appearing, the patient is poorly nourished appearing and cachectic. Temporal wasting is noted. Head: NCAT Eyes: PERL, EOMI Nose: no epistaixis or rhinorrhea Mouth/throat: mucosa is moist and pink, poor oral hygiene Neck: supple, no stridor Lungs: CTA B, no wheezing, rhonchi or rales CV: RRR, no murmur, extremities appear well perfused.  Abd: soft, notender, nondistended Back: no ttp, no cva ttp Skin: warm and dry Ext: normal to inspection, there is tenderness to palpation over the right hip and pain with attempts to passively range the right hip, right lower extremity is nontender, no dependent edema. Equal DP pulses are palpable, Neuro: CN ii-xii grossly intact, no focal deficits, sensation intact light-touch throughout the legs. Psyche; normal affect,  calm and cooperative.   ED Course  Procedures (including critical care time) Labs Review  Results for orders placed during the hospital encounter of 07/30/13 (from the past 24 hour(s))  COMPREHENSIVE METABOLIC PANEL     Status: Abnormal   Collection Time    07/30/13 11:35 PM      Result Value Ref Range   Sodium 131 (*) 137 - 147 mEq/L   Potassium 4.1  3.7 - 5.3 mEq/L   Chloride 94 (*) 96 - 112 mEq/L   CO2 24  19 - 32 mEq/L   Glucose, Bld 103 (*) 70 - 99 mg/dL   BUN 20  6 - 23 mg/dL   Creatinine, Ser 0.92  0.50 - 1.35 mg/dL   Calcium 9.5  8.4 - 10.5 mg/dL   Total Protein 8.3  6.0 - 8.3 g/dL   Albumin 2.0 (*) 3.5 - 5.2 g/dL   AST 34  0 - 37 U/L   ALT 21  0 - 53 U/L   Alkaline Phosphatase 97  39 - 117 U/L   Total Bilirubin 0.7  0.3 - 1.2 mg/dL   GFR calc non Af Amer >90  >90 mL/min   GFR calc Af Amer >90  >90 mL/min   Anion gap 13  5 - 15  I-STAT CG4 LACTIC ACID, ED     Status: None   Collection Time    07/30/13 11:42 PM      Result Value Ref Range   Lactic Acid, Venous 1.58  0.5 - 2.2 mmol/L  CBC WITH DIFFERENTIAL     Status: Abnormal   Collection Time    07/31/13 12:25 AM       Result Value Ref Range   WBC 9.4  4.0 - 10.5 K/uL   RBC 2.51 (*) 4.22 - 5.81 MIL/uL   Hemoglobin 8.5 (*) 13.0 - 17.0 g/dL   HCT 25.8 (*) 39.0 - 52.0 %  MCV 102.8 (*) 78.0 - 100.0 fL   MCH 33.9  26.0 - 34.0 pg   MCHC 32.9  30.0 - 36.0 g/dL   RDW 14.7  11.5 - 15.5 %   Platelets 204  150 - 400 K/uL   Neutrophils Relative % 82 (*) 43 - 77 %   Lymphocytes Relative 9 (*) 12 - 46 %   Monocytes Relative 8  3 - 12 %   Eosinophils Relative 1  0 - 5 %   Basophils Relative 0  0 - 1 %   Neutro Abs 7.7  1.7 - 7.7 K/uL   Lymphs Abs 0.8  0.7 - 4.0 K/uL   Monocytes Absolute 0.8  0.1 - 1.0 K/uL   Eosinophils Absolute 0.1  0.0 - 0.7 K/uL   Basophils Absolute 0.0  0.0 - 0.1 K/uL   WBC Morphology TOXIC GRANULATION    I-STAT VENOUS BLOOD GAS, ED     Status: Abnormal   Collection Time    07/31/13 12:32 AM      Result Value Ref Range   pH, Ven 7.409 (*) 7.250 - 7.300   pCO2, Ven 42.3 (*) 45.0 - 50.0 mmHg   pO2, Ven 26.0 (*) 30.0 - 45.0 mmHg   Bicarbonate 26.8 (*) 20.0 - 24.0 mEq/L   TCO2 28  0 - 100 mmol/L   O2 Saturation 48.0     Acid-Base Excess 2.0  0.0 - 2.0 mmol/L   Collection site BRACHIAL ARTERY     Sample type VENOUS     Comment NOTIFIED PHYSICIAN     CRITICAL CARE Performed by: Elyn Peers   Total critical care time: 45m  Critical care time was exclusive of separately billable procedures and treating other patients.  Critical care was necessary to treat or prevent imminent or life-threatening deterioration.  Critical care was time spent personally by me on the following activities: development of treatment plan with patient and/or surrogate as well as nursing, discussions with consultants, evaluation of patient's response to treatment, examination of patient, obtaining history from patient or surrogate, ordering and performing treatments and interventions, ordering and review of laboratory studies, ordering and review of radiographic studies, pulse oximetry and re-evaluation of  patient's condition.   MDM   This patient with AIDS presents with sepsis secondary to septic arthritis of the right hip which has been previously diagnosed today on CT of the right hip. Unfortunately, these images are not available for review since they were done at another facility. I have discussed the patient's case with Dr. [name] of orthopedic surgery. He recommends an emergent repeat CT. This has been ordered.  I have also discussed the patient's case with [name], his infectious disease specialist. He does not feel that antifungal or antibiotic for bacterial coverage is indicated at this point. He agrees with empiric antibiotic treatment with ceftriaxone and vancomycin.  The patient was initially tachycardic and hypotensive. However, he has had an excellent response to fluid resuscitation and is now receiving maintenance fluids. His lactic acid level is normal.  Anticipated the patient will need to the operating room and under fluid can be obtained for culture analysis.  Case discussed with internal medicine teaching resident who will admit to the step down unit.    Elyn Peers, MD 07/31/13 [phone]

## 2013-07-31 ENCOUNTER — Encounter (HOSPITAL_COMMUNITY)
Admission: EM | Disposition: A | Discharge status: Nursing Home (Includes ICF) | Payer: Self-pay | Source: Home / Self Care | Attending: Internal Medicine

## 2013-07-31 ENCOUNTER — Encounter (HOSPITAL_COMMUNITY): Payer: No Typology Code available for payment source | Admitting: Anesthesiology

## 2013-07-31 ENCOUNTER — Emergency Department (HOSPITAL_COMMUNITY): Payer: No Typology Code available for payment source

## 2013-07-31 ENCOUNTER — Inpatient Hospital Stay (HOSPITAL_COMMUNITY): Payer: No Typology Code available for payment source | Admitting: Anesthesiology

## 2013-07-31 ENCOUNTER — Encounter (HOSPITAL_COMMUNITY): Payer: Self-pay | Admitting: Anesthesiology

## 2013-07-31 DIAGNOSIS — E43 Unspecified severe protein-calorie malnutrition: Secondary | ICD-10-CM | POA: Diagnosis present

## 2013-07-31 DIAGNOSIS — M009 Pyogenic arthritis, unspecified: Secondary | ICD-10-CM | POA: Diagnosis present

## 2013-07-31 DIAGNOSIS — L89154 Pressure ulcer of sacral region, stage 4: Secondary | ICD-10-CM | POA: Diagnosis present

## 2013-07-31 DIAGNOSIS — L89152 Pressure ulcer of sacral region, stage 2: Secondary | ICD-10-CM | POA: Diagnosis present

## 2013-07-31 HISTORY — PX: INCISION AND DRAINAGE HIP: SHX1801

## 2013-07-31 LAB — CBC WITH DIFFERENTIAL/PLATELET
BASOS ABS: 0 10*3/uL (ref 0.0–0.1)
BASOS PCT: 0 % (ref 0–1)
Eosinophils Absolute: 0.1 10*3/uL (ref 0.0–0.7)
Eosinophils Relative: 1 % (ref 0–5)
HCT: 25.8 % — ABNORMAL LOW (ref 39.0–52.0)
HEMOGLOBIN: 8.5 g/dL — AB (ref 13.0–17.0)
Lymphocytes Relative: 9 % — ABNORMAL LOW (ref 12–46)
Lymphs Abs: 0.8 10*3/uL (ref 0.7–4.0)
MCH: 33.9 pg (ref 26.0–34.0)
MCHC: 32.9 g/dL (ref 30.0–36.0)
MCV: 102.8 fL — ABNORMAL HIGH (ref 78.0–100.0)
MONO ABS: 0.8 10*3/uL (ref 0.1–1.0)
Monocytes Relative: 8 % (ref 3–12)
NEUTROS PCT: 82 % — AB (ref 43–77)
Neutro Abs: 7.7 10*3/uL (ref 1.7–7.7)
PLATELETS: 204 10*3/uL (ref 150–400)
RBC: 2.51 MIL/uL — ABNORMAL LOW (ref 4.22–5.81)
RDW: 14.7 % (ref 11.5–15.5)
WBC: 9.4 10*3/uL (ref 4.0–10.5)

## 2013-07-31 LAB — URINALYSIS, ROUTINE W REFLEX MICROSCOPIC
GLUCOSE, UA: NEGATIVE mg/dL
KETONES UR: NEGATIVE mg/dL
Nitrite: POSITIVE — AB
PH: 8.5 — AB (ref 5.0–8.0)
Protein, ur: >300 mg/dL — AB
Urobilinogen, UA: 2 mg/dL — ABNORMAL HIGH (ref 0.0–1.0)

## 2013-07-31 LAB — CBC
HCT: 26.3 % — ABNORMAL LOW (ref 39.0–52.0)
Hemoglobin: 8.6 g/dL — ABNORMAL LOW (ref 13.0–17.0)
MCH: 34.4 pg — ABNORMAL HIGH (ref 26.0–34.0)
MCHC: 32.7 g/dL (ref 30.0–36.0)
MCV: 105.2 fL — AB (ref 78.0–100.0)
PLATELETS: 196 10*3/uL (ref 150–400)
RBC: 2.5 MIL/uL — ABNORMAL LOW (ref 4.22–5.81)
RDW: 14.7 % (ref 11.5–15.5)
WBC: 8.4 10*3/uL (ref 4.0–10.5)

## 2013-07-31 LAB — URINE MICROSCOPIC-ADD ON

## 2013-07-31 LAB — BASIC METABOLIC PANEL
Anion gap: 9 (ref 5–15)
BUN: 18 mg/dL (ref 6–23)
CALCIUM: 8 mg/dL — AB (ref 8.4–10.5)
CO2: 24 mEq/L (ref 19–32)
Chloride: 100 mEq/L (ref 96–112)
Creatinine, Ser: 0.78 mg/dL (ref 0.50–1.35)
GFR calc non Af Amer: >90 mL/min (ref >90)
GLUCOSE: 100 mg/dL — AB (ref 70–99)
Potassium: 3.8 mEq/L (ref 3.7–5.3)
SODIUM: 133 meq/L — AB (ref 137–147)

## 2013-07-31 LAB — I-STAT VENOUS BLOOD GAS, ED
Acid-Base Excess: 2 mmol/L (ref 0.0–2.0)
Bicarbonate: 26.8 mEq/L — ABNORMAL HIGH (ref 20.0–24.0)
O2 SAT: 48 %
TCO2: 28 mmol/L (ref 0–100)
pCO2, Ven: 42.3 mmHg — ABNORMAL LOW (ref 45.0–50.0)
pH, Ven: 7.409 — ABNORMAL HIGH (ref 7.250–7.300)
pO2, Ven: 26 mmHg — CL (ref 30.0–45.0)

## 2013-07-31 LAB — COMPREHENSIVE METABOLIC PANEL
ALBUMIN: 2 g/dL — AB (ref 3.5–5.2)
ALK PHOS: 97 U/L (ref 39–117)
ALT: 21 U/L (ref 0–53)
ANION GAP: 13 (ref 5–15)
AST: 34 U/L (ref 0–37)
BILIRUBIN TOTAL: 0.7 mg/dL (ref 0.3–1.2)
BUN: 20 mg/dL (ref 6–23)
CO2: 24 mEq/L (ref 19–32)
Calcium: 9.5 mg/dL (ref 8.4–10.5)
Chloride: 94 mEq/L — ABNORMAL LOW (ref 96–112)
Creatinine, Ser: 0.92 mg/dL (ref 0.50–1.35)
GFR calc Af Amer: >90 mL/min (ref >90)
GFR calc non Af Amer: >90 mL/min (ref >90)
Glucose, Bld: 103 mg/dL — ABNORMAL HIGH (ref 70–99)
Potassium: 4.1 mEq/L (ref 3.7–5.3)
Sodium: 131 mEq/L — ABNORMAL LOW (ref 137–147)
TOTAL PROTEIN: 8.3 g/dL (ref 6.0–8.3)

## 2013-07-31 LAB — T-HELPER CELLS (CD4) COUNT (NOT AT ARMC)
CD4 % Helper T Cell: 20 % — ABNORMAL LOW (ref 33–55)
CD4 T Cell Abs: 140 /uL — ABNORMAL LOW (ref 400–2700)

## 2013-07-31 LAB — PROTIME-INR
INR: 1.62 — AB (ref 0.00–1.49)
Prothrombin Time: 19.2 seconds — ABNORMAL HIGH (ref 11.6–15.2)

## 2013-07-31 LAB — FOLATE: Folate: 5.5 ng/mL

## 2013-07-31 LAB — MRSA PCR SCREENING: MRSA BY PCR: NEGATIVE

## 2013-07-31 LAB — VITAMIN B12: Vitamin B-12: 247 pg/mL (ref 211–911)

## 2013-07-31 SURGERY — IRRIGATION AND DEBRIDEMENT HIP
Anesthesia: General | Site: Hip | Laterality: Right

## 2013-07-31 MED ORDER — HEPARIN SODIUM (PORCINE) 5000 UNIT/ML IJ SOLN: 5000.0000 [IU] | Freq: Three times a day (TID) | INTRAMUSCULAR | Status: DC

## 2013-07-31 MED ORDER — ONDANSETRON HCL 4 MG/2ML IJ SOLN
4.0000 mg | Freq: Four times a day (QID) | INTRAMUSCULAR | Status: DC | PRN
  Administered 2013-08-02 – 2013-08-04 (×7): 4 mg via INTRAVENOUS
  Filled 2013-07-31 (×7): qty 2

## 2013-07-31 MED ORDER — HYDROMORPHONE HCL PF 1 MG/ML IJ SOLN
0.2500 mg | INTRAMUSCULAR | Status: DC | PRN
  Administered 2013-07-31 (×2): 0.5 mg via INTRAVENOUS

## 2013-07-31 MED ORDER — KCL IN DEXTROSE-NACL 20-5-0.45 MEQ/L-%-% IV SOLN
Freq: Once | INTRAVENOUS | Status: AC
  Administered 2013-07-31: 02:00:00 via INTRAVENOUS
  Filled 2013-07-31: qty 1000

## 2013-07-31 MED ORDER — SULFAMETHOXAZOLE-TMP DS 800-160 MG PO TABS: 1.0000 | ORAL_TABLET | Freq: Two times a day (BID) | ORAL | Status: DC

## 2013-07-31 MED ORDER — NYSTATIN 100000 UNIT/ML MT SUSP
5.0000 mL | Freq: Four times a day (QID) | OROMUCOSAL | Status: DC
  Administered 2013-07-31 – 2013-08-13 (×49): 500000 [IU] via ORAL
  Filled 2013-07-31 (×56): qty 5

## 2013-07-31 MED ORDER — ENSURE COMPLETE PO LIQD
237.0000 mL | Freq: Two times a day (BID) | ORAL | Status: DC
  Administered 2013-07-31 – 2013-08-05 (×7): 237 mL via ORAL

## 2013-07-31 MED ORDER — SODIUM CHLORIDE 0.9 % IR SOLN: Status: DC | PRN

## 2013-07-31 MED ORDER — SODIUM CHLORIDE 0.9 % IV SOLN
INTRAVENOUS | Status: DC
  Administered 2013-07-31 – 2013-08-06 (×6): via INTRAVENOUS

## 2013-07-31 MED ORDER — ARTIFICIAL TEARS OP OINT
TOPICAL_OINTMENT | OPHTHALMIC | Status: DC | PRN
  Administered 2013-07-31: 1 via OPHTHALMIC

## 2013-07-31 MED ORDER — HYDROMORPHONE HCL PF 1 MG/ML IJ SOLN
INTRAMUSCULAR | Status: AC
  Filled 2013-07-31: qty 1

## 2013-07-31 MED ORDER — ROCURONIUM BROMIDE 50 MG/5ML IV SOLN
INTRAVENOUS | Status: AC
  Filled 2013-07-31: qty 1

## 2013-07-31 MED ORDER — FENTANYL CITRATE 0.05 MG/ML IJ SOLN
INTRAMUSCULAR | Status: DC | PRN
  Administered 2013-07-31 (×2): 50 ug via INTRAVENOUS
  Administered 2013-07-31: 100 ug via INTRAVENOUS
  Administered 2013-07-31: 50 ug via INTRAVENOUS

## 2013-07-31 MED ORDER — ONDANSETRON HCL 4 MG/2ML IJ SOLN
INTRAMUSCULAR | Status: DC | PRN
  Administered 2013-07-31: 4 mg via INTRAVENOUS

## 2013-07-31 MED ORDER — IOHEXOL 300 MG/ML  SOLN
100.0000 mL | Freq: Once | INTRAMUSCULAR | Status: AC | PRN
  Administered 2013-07-31: 100 mL via INTRAVENOUS

## 2013-07-31 MED ORDER — PROPOFOL 10 MG/ML IV BOLUS
INTRAVENOUS | Status: DC | PRN
  Administered 2013-07-31: 100 mg via INTRAVENOUS

## 2013-07-31 MED ORDER — GLYCOPYRROLATE 0.2 MG/ML IJ SOLN
INTRAMUSCULAR | Status: AC
  Filled 2013-07-31: qty 2

## 2013-07-31 MED ORDER — FENTANYL CITRATE 0.05 MG/ML IJ SOLN
INTRAMUSCULAR | Status: AC
  Filled 2013-07-31: qty 5

## 2013-07-31 MED ORDER — ONDANSETRON HCL 4 MG PO TABS: 4.0000 mg | ORAL_TABLET | Freq: Four times a day (QID) | ORAL | Status: DC | PRN

## 2013-07-31 MED ORDER — LIDOCAINE HCL (CARDIAC) 20 MG/ML IV SOLN
INTRAVENOUS | Status: AC
  Filled 2013-07-31: qty 5

## 2013-07-31 MED ORDER — HYDROMORPHONE HCL PF 1 MG/ML IJ SOLN: 1.0000 mg | INTRAMUSCULAR | Status: DC | PRN

## 2013-07-31 MED ORDER — TENOFOVIR DISOPROXIL FUMARATE 300 MG PO TABS
300.0000 mg | ORAL_TABLET | Freq: Every day | ORAL | Status: DC
  Administered 2013-07-31 – 2013-08-13 (×14): 300 mg via ORAL
  Filled 2013-07-31 (×14): qty 1

## 2013-07-31 MED ORDER — MIDAZOLAM HCL 2 MG/2ML IJ SOLN
INTRAMUSCULAR | Status: AC
  Filled 2013-07-31: qty 2

## 2013-07-31 MED ORDER — PIPERACILLIN-TAZOBACTAM 3.375 G IVPB
3.3750 g | Freq: Three times a day (TID) | INTRAVENOUS | Status: DC
  Administered 2013-07-31 – 2013-08-06 (×18): 3.375 g via INTRAVENOUS
  Filled 2013-07-31 (×21): qty 50

## 2013-07-31 MED ORDER — DEXTROSE 5 % IV SOLN
1.0000 g | Freq: Two times a day (BID) | INTRAVENOUS | Status: DC
  Administered 2013-07-31: 1 g via INTRAVENOUS
  Filled 2013-07-31 (×2): qty 10

## 2013-07-31 MED ORDER — DEXTROSE 5 % IV SOLN
2.0000 g | Freq: Once | INTRAVENOUS | Status: AC
  Administered 2013-07-31: 2 g via INTRAVENOUS
  Filled 2013-07-31: qty 2

## 2013-07-31 MED ORDER — MIDAZOLAM HCL 5 MG/5ML IJ SOLN
INTRAMUSCULAR | Status: DC | PRN
  Administered 2013-07-31: 2 mg via INTRAVENOUS

## 2013-07-31 MED ORDER — ONDANSETRON HCL 4 MG/2ML IJ SOLN
INTRAMUSCULAR | Status: AC
  Filled 2013-07-31: qty 2

## 2013-07-31 MED ORDER — SODIUM CHLORIDE 0.9 % IV BOLUS (SEPSIS)
1000.0000 mL | Freq: Once | INTRAVENOUS | Status: AC
  Administered 2013-07-31: 1000 mL via INTRAVENOUS

## 2013-07-31 MED ORDER — LACTATED RINGERS IV SOLN
INTRAVENOUS | Status: DC | PRN
  Administered 2013-07-31 (×2): via INTRAVENOUS

## 2013-07-31 MED ORDER — SODIUM CHLORIDE 0.9 % IJ SOLN
3.0000 mL | Freq: Two times a day (BID) | INTRAMUSCULAR | Status: DC
  Administered 2013-07-31: 3 mL via INTRAVENOUS

## 2013-07-31 MED ORDER — HYDROMORPHONE HCL PF 1 MG/ML IJ SOLN
0.5000 mg | INTRAMUSCULAR | Status: DC | PRN
  Administered 2013-07-31: 1 mg via INTRAVENOUS
  Administered 2013-08-01: 0.5 mg via INTRAVENOUS
  Administered 2013-08-02 – 2013-08-05 (×8): 1 mg via INTRAVENOUS
  Filled 2013-07-31 (×10): qty 1

## 2013-07-31 MED ORDER — ONDANSETRON HCL 4 MG/2ML IJ SOLN: 4.0000 mg | Freq: Four times a day (QID) | INTRAMUSCULAR | Status: DC | PRN

## 2013-07-31 MED ORDER — RITONAVIR 100 MG PO TABS
100.0000 mg | ORAL_TABLET | Freq: Every day | ORAL | Status: DC
  Administered 2013-07-31 – 2013-08-13 (×13): 100 mg via ORAL
  Filled 2013-07-31 (×16): qty 1

## 2013-07-31 MED ORDER — WHITE PETROLATUM GEL
Status: AC
Start: 2013-07-31 — End: 2013-07-31
  Administered 2013-07-31
  Filled 2013-07-31: qty 5

## 2013-07-31 MED ORDER — ATAZANAVIR SULFATE 150 MG PO CAPS
300.0000 mg | ORAL_CAPSULE | Freq: Every day | ORAL | Status: DC
  Administered 2013-07-31 – 2013-08-13 (×13): 300 mg via ORAL
  Filled 2013-07-31 (×16): qty 2

## 2013-07-31 MED ORDER — SODIUM CHLORIDE 0.9 % IV SOLN
500.0000 mg | Freq: Three times a day (TID) | INTRAVENOUS | Status: DC
  Administered 2013-07-31: 500 mg via INTRAVENOUS
  Filled 2013-07-31 (×5): qty 500

## 2013-07-31 MED ORDER — ACETAMINOPHEN 325 MG PO TABS
650.0000 mg | ORAL_TABLET | Freq: Once | ORAL | Status: AC
  Administered 2013-07-31: 650 mg via ORAL
  Filled 2013-07-31: qty 2

## 2013-07-31 MED ORDER — LIDOCAINE HCL (CARDIAC) 20 MG/ML IV SOLN
INTRAVENOUS | Status: DC | PRN
  Administered 2013-07-31: 50 mg via INTRAVENOUS

## 2013-07-31 MED ORDER — METOCLOPRAMIDE HCL 5 MG/ML IJ SOLN
5.0000 mg | Freq: Three times a day (TID) | INTRAMUSCULAR | Status: DC | PRN
  Filled 2013-07-31: qty 2

## 2013-07-31 MED ORDER — SULFAMETHOXAZOLE-TMP DS 800-160 MG PO TABS
1.0000 | ORAL_TABLET | Freq: Every day | ORAL | Status: DC
  Administered 2013-07-31 – 2013-08-13 (×14): 1 via ORAL
  Filled 2013-07-31 (×14): qty 1

## 2013-07-31 MED ORDER — METOCLOPRAMIDE HCL 5 MG PO TABS
5.0000 mg | ORAL_TABLET | Freq: Three times a day (TID) | ORAL | Status: DC | PRN
  Filled 2013-07-31: qty 2

## 2013-07-31 MED ORDER — 0.9 % SODIUM CHLORIDE (POUR BTL) OPTIME
TOPICAL | Status: DC | PRN
  Administered 2013-07-31: 1000 mL

## 2013-07-31 MED ORDER — LACTATED RINGERS IV SOLN
INTRAVENOUS | Status: DC
  Administered 2013-07-31: 13:00:00 via INTRAVENOUS

## 2013-07-31 MED ORDER — PROPOFOL 10 MG/ML IV BOLUS
INTRAVENOUS | Status: AC
  Filled 2013-07-31: qty 20

## 2013-07-31 MED ORDER — CHLORHEXIDINE GLUCONATE 4 % EX LIQD
60.0000 mL | Freq: Once | CUTANEOUS | Status: DC
  Filled 2013-07-31: qty 60

## 2013-07-31 MED ORDER — ONDANSETRON HCL 4 MG PO TABS
4.0000 mg | ORAL_TABLET | Freq: Four times a day (QID) | ORAL | Status: DC | PRN
Start: 2013-07-31 — End: 2013-07-31

## 2013-07-31 MED ORDER — LAMIVUDINE-ZIDOVUDINE 150-300 MG PO TABS
1.0000 | ORAL_TABLET | Freq: Two times a day (BID) | ORAL | Status: DC
  Administered 2013-08-01 – 2013-08-13 (×25): 1 via ORAL
  Filled 2013-07-31 (×27): qty 1

## 2013-07-31 SURGICAL SUPPLY — 54 items
BANDAGE GAUZE ELAST BULKY 4 IN (GAUZE/BANDAGES/DRESSINGS) ×3 IMPLANT
BNDG COHESIVE 4X5 TAN STRL (GAUZE/BANDAGES/DRESSINGS) ×3 IMPLANT
BRUSH SCRUB DISP (MISCELLANEOUS) ×3 IMPLANT
COVER SURGICAL LIGHT HANDLE (MISCELLANEOUS) ×3 IMPLANT
DRAIN CHANNEL 10M FLAT 3/4 FLT (DRAIN) ×4 IMPLANT
DRAPE INCISE IOBAN 66X45 STRL (DRAPES) ×3 IMPLANT
DRAPE ORTHO SPLIT 77X108 STRL (DRAPES) ×6
DRAPE PROXIMA HALF (DRAPES) ×6 IMPLANT
DRAPE SURG ORHT 6 SPLT 77X108 (DRAPES) ×2 IMPLANT
DRAPE U-SHAPE 47X51 STRL (DRAPES) ×3 IMPLANT
DRSG ADAPTIC 3X8 NADH LF (GAUZE/BANDAGES/DRESSINGS) ×3 IMPLANT
DRSG PAD ABDOMINAL 8X10 ST (GAUZE/BANDAGES/DRESSINGS) ×4 IMPLANT
DURAPREP 26ML APPLICATOR (WOUND CARE) ×3 IMPLANT
ELECT REM PT RETURN 9FT ADLT (ELECTROSURGICAL)
ELECTRODE REM PT RTRN 9FT ADLT (ELECTROSURGICAL) IMPLANT
EVACUATOR SILICONE 100CC (DRAIN) ×4 IMPLANT
GLOVE BIOGEL PI ORTHO PRO 7.5 (GLOVE) ×2
GLOVE BIOGEL PI ORTHO PRO SZ8 (GLOVE) ×2
GLOVE ORTHO TXT STRL SZ7.5 (GLOVE) ×3 IMPLANT
GLOVE PI ORTHO PRO STRL 7.5 (GLOVE) ×1 IMPLANT
GLOVE PI ORTHO PRO STRL SZ8 (GLOVE) ×1 IMPLANT
GLOVE SURG ORTHO 8.5 STRL (GLOVE) ×3 IMPLANT
GOWN STRL REUS W/ TWL LRG LVL3 (GOWN DISPOSABLE) ×2 IMPLANT
GOWN STRL REUS W/ TWL XL LVL3 (GOWN DISPOSABLE) ×2 IMPLANT
GOWN STRL REUS W/TWL LRG LVL3 (GOWN DISPOSABLE) ×6
GOWN STRL REUS W/TWL XL LVL3 (GOWN DISPOSABLE) ×6
HANDPIECE INTERPULSE COAX TIP (DISPOSABLE)
KIT BASIN OR (CUSTOM PROCEDURE TRAY) ×3 IMPLANT
KIT ROOM TURNOVER OR (KITS) ×3 IMPLANT
MANIFOLD NEPTUNE II (INSTRUMENTS) ×3 IMPLANT
NS IRRIG 1000ML POUR BTL (IV SOLUTION) ×3 IMPLANT
PACK GENERAL/GYN (CUSTOM PROCEDURE TRAY) ×3 IMPLANT
PAD ARMBOARD 7.5X6 YLW CONV (MISCELLANEOUS) ×6 IMPLANT
PAD CAST 4YDX4 CTTN HI CHSV (CAST SUPPLIES) ×1 IMPLANT
PADDING CAST COTTON 4X4 STRL (CAST SUPPLIES) ×3
SET HNDPC FAN SPRY TIP SCT (DISPOSABLE) IMPLANT
SPONGE GAUZE 4X4 12PLY (GAUZE/BANDAGES/DRESSINGS) ×3 IMPLANT
SPONGE LAP 18X18 X RAY DECT (DISPOSABLE) ×3 IMPLANT
SPONGE LAP 4X18 X RAY DECT (DISPOSABLE) ×3 IMPLANT
STAPLER VISISTAT 35W (STAPLE) ×3 IMPLANT
STOCKINETTE IMPERVIOUS 9X36 MD (GAUZE/BANDAGES/DRESSINGS) ×3 IMPLANT
STOCKINETTE IMPERVIOUS LG (DRAPES) ×3 IMPLANT
SUT ETHILON 2 0 FS 18 (SUTURE) ×4 IMPLANT
SUT ETHILON 4 0 FS 1 (SUTURE) IMPLANT
SUT VIC AB 2-0 CT1 27 (SUTURE)
SUT VIC AB 2-0 CT1 TAPERPNT 27 (SUTURE) IMPLANT
SWAB COLLECTION DEVICE MRSA (MISCELLANEOUS) ×2 IMPLANT
TAPE CLOTH SURG 4X10 WHT LF (GAUZE/BANDAGES/DRESSINGS) ×2 IMPLANT
TOWEL OR 17X24 6PK STRL BLUE (TOWEL DISPOSABLE) ×3 IMPLANT
TOWEL OR 17X26 10 PK STRL BLUE (TOWEL DISPOSABLE) ×3 IMPLANT
TRAY FOLEY CATH 16FRSI W/METER (SET/KITS/TRAYS/PACK) IMPLANT
TUBE ANAEROBIC SPECIMEN COL (MISCELLANEOUS) ×2 IMPLANT
UNDERPAD 30X30 INCONTINENT (UNDERPADS AND DIAPERS) ×3 IMPLANT
WATER STERILE IRR 1000ML POUR (IV SOLUTION) ×3 IMPLANT

## 2013-07-31 NOTE — Progress Notes (Signed)
Pt transferred from the ED with RN, pt having a lot of pain with movement and assisted into bed. VSS. Will continue to monitor. Family at bedside.

## 2013-07-31 NOTE — ED Notes (Signed)
Patient returned from CT

## 2013-07-31 NOTE — Progress Notes (Signed)
ANTIBIOTIC CONSULT NOTE - INITIAL  Pharmacy Consult for Vancocin and Rocephin Indication: septic arthritis w/ osteomyelitis  No Known Allergies  Patient Measurements: Height: 6' 1.5" (186.7 cm) Weight: 123 lb 0.3 oz (55.8 kg) IBW/kg (Calculated) : 81.05  Vital Signs: Temp: 101.3 F (38.5 C) (07/23 1100) Temp src: Oral (07/23 1100) BP: 99/56 mmHg (07/23 1200) Pulse Rate: 80 (07/23 1200)  Labs:  Recent Labs  07/30/13 2335 07/31/13 0025 07/31/13 0530  WBC  --  9.4 8.4  HGB  --  8.5* 8.6*  PLT  --  204 196  CREATININE 0.92  --  0.78   Estimated Creatinine Clearance: 86.2 ml/min (by C-G formula based on Cr of 0.78).   Medical History: Past Medical History  Diagnosis Date  . Radiation   . HIV (human immunodeficiency virus infection)   . Shingles     left shoulder, right leg  . Pain in limb 07/23/2013  . Rectal cancer     Squamous cell  . Gout     Medications:  Prescriptions prior to admission  Medication Sig Dispense Refill  . acetaminophen (TYLENOL) 500 MG tablet Take 1,000 mg by mouth every 6 (six) hours as needed for mild pain or moderate pain.       Marland Kitchen atazanavir (REYATAZ) 300 MG capsule Take 300 mg by mouth daily with breakfast.      . HYDROmorphone (DILAUDID) 2 MG tablet Take 1 tablet (2 mg total) by mouth every 4 (four) hours as needed for severe pain.  90 tablet  0  . lamiVUDine-zidovudine (COMBIVIR) 150-300 MG per tablet Take 1 tablet by mouth 2 (two) times daily.  60 tablet  6  . ritonavir (NORVIR) 100 MG TABS tablet Take 1 tablet (100 mg total) by mouth daily.  30 tablet  6  . tenofovir (VIREAD) 300 MG tablet Take 1 tablet (300 mg total) by mouth daily.  30 tablet  6   Scheduled:  . [facility] HOLD] atazanavir  300 mg Oral Q breakfast  . chlorhexidine  60 mL Topical Once  . [facility] HOLD] lamiVUDine-zidovudine  1 tablet Oral BID  . nystatin  5 mL Oral QID  . [[facility] HOLD] piperacillin-tazobactam (ZOSYN)  IV  3.375 g Intravenous 3 times per day  . [facility] HOLD]  ritonavir  100 mg Oral Q breakfast  . [[facility] HOLD] sodium chloride  3 mL Intravenous Q12H  . [facility] HOLD] tenofovir  300 mg Oral Daily  . [facility] HOLD] vancomycin  500 mg Intravenous Q8H    Assessment: 53yo male was sent to ED by [facility] Neuro for evidence of hip infection after office visit w/ c/o inability to bear weight on right side, CT is consistent w/ septic arthritis of right hip and osteomyelitis of acetabulum and probable early osteo of femoral head, to begin IV abx.  Goal of Therapy:  Vancomycin trough level 15-20 mcg/ml  Plan:  -Continue vancomycin 500 mg IV q8h -Begin Zosyn 3.375 g IV q8h - stop Rocephin -Vanc trough at steady state -F/u blood, urine cx   Hughes Better, PharmD, BCPS Clinical Pharmacist Pager: [phone] 07/31/2013 1:41 PM

## 2013-07-31 NOTE — Progress Notes (Signed)
ANTIBIOTIC CONSULT NOTE - INITIAL  Pharmacy Consult for Vancocin and Rocephin Indication: septic arthritis w/ osteomyelitis  No Known Allergies  Patient Measurements: Height: 6' 1.5" (186.7 cm) Weight: 132 lb (59.875 kg) IBW/kg (Calculated) : 81.05  Vital Signs: Temp: 101.2 F (38.4 C) (07/22 2340) Temp src: Rectal (07/22 2340) BP: 91/51 mmHg (07/23 0301) Pulse Rate: 72 (07/23 0301)  Labs:  Recent Labs  07/30/13 2335 07/31/13 0025  WBC  --  9.4  HGB  --  8.5*  PLT  --  204  CREATININE 0.92  --    Estimated Creatinine Clearance: 80.5 ml/min (by C-G formula based on Cr of 0.92).   Medical History: Past Medical History  Diagnosis Date  . Radiation   . HIV (human immunodeficiency virus infection)   . Shingles     left shoulder, right leg  . Pain in limb 07/23/2013  . Rectal cancer     Squamous cell  . Gout     Medications:  Prescriptions prior to admission  Medication Sig Dispense Refill  . acetaminophen (TYLENOL) 500 MG tablet Take 1,000 mg by mouth every 6 (six) hours as needed for mild pain or moderate pain.       Marland Kitchen atazanavir (REYATAZ) 300 MG capsule Take 300 mg by mouth daily with breakfast.      . HYDROmorphone (DILAUDID) 2 MG tablet Take 1 tablet (2 mg total) by mouth every 4 (four) hours as needed for severe pain.  90 tablet  0  . lamiVUDine-zidovudine (COMBIVIR) 150-300 MG per tablet Take 1 tablet by mouth 2 (two) times daily.  60 tablet  6  . ritonavir (NORVIR) 100 MG TABS tablet Take 1 tablet (100 mg total) by mouth daily.  30 tablet  6  . tenofovir (VIREAD) 300 MG tablet Take 1 tablet (300 mg total) by mouth daily.  30 tablet  6   Scheduled:  . sodium chloride  1,000 mL Intravenous Once    Assessment: 53yo male was sent to ED by [facility] Neuro for evidence of hip infection after office visit w/ c/o inability to bear weight on right side, CT is consistent w/ septic arthritis of right hip w/ osteomyelitis of acetabulum and probable early osteo of  femoral head, to begin IV ABX.  Goal of Therapy:  Vancomycin trough level 15-20 mcg/ml  Plan:  Rec'd vanc 1g and Rocephin 2g IV in ED; will continue with vancomycin 500mg  IV Q8H and Rocephin 1g IV Q12H and monitor CBC, Cx, levels prn.  Wynona Neat, PharmD, BCPS  07/31/2013,3:17 AM

## 2013-07-31 NOTE — Consult Note (Addendum)
WOC consult:  Consult requested for sacral wound.  Chart and CT scan results reviewed.  Ortho team performed consult for hip and osteomyelitis; they recommend surgical consult for sacral wound. CT scan indicates extensive tracking of fluid to pelvic area and appearance is necrotic.  The description is consistent with a full-thickness abscessed wound, which has contributed to an unstageable pressure ulcer. This complex wound is beyond [facility] scope of practice and would require surgical debridement if aggressive plan of care is desired; please refer to [facility] for further assessment and plan of care. Progress notes indicate that Dr Dalbert Batman is familiar with the patient prior to admission.  Please re-consult if further assistance is needed.  Thank-you,  Julien Girt MSN, [place], [place], [place], [place]

## 2013-07-31 NOTE — Anesthesia Preprocedure Evaluation (Addendum)
Anesthesia Evaluation  Patient identified by MRN, date of birth, ID band Patient awake    Reviewed: Allergy & Precautions, H&P , NPO status , Patient's Chart, lab work & pertinent test results  Airway Mallampati: II      Dental   Pulmonary Current Smoker,          Cardiovascular + Peripheral Vascular Disease Rhythm:Regular     Neuro/Psych    GI/Hepatic negative GI ROS, Neg liver ROS,   Endo/Other  negative endocrine ROS  Renal/GU negative Renal ROS     Musculoskeletal   Abdominal   Peds  Hematology   Anesthesia Other Findings   Reproductive/Obstetrics                          Anesthesia Physical Anesthesia Plan  ASA: III  Anesthesia Plan:    Post-op Pain Management:    Induction: Intravenous  Airway Management Planned: Oral ETT  Additional Equipment:   Intra-op Plan:   Post-operative Plan: Extubation in OR  Informed Consent: I have reviewed the patients History and Physical, chart, labs and discussed the procedure including the risks, benefits and alternatives for the proposed anesthesia with the patient or authorized representative who has indicated his/her understanding and acceptance.   Dental advisory given  Plan Discussed with: CRNA and Anesthesiologist  Anesthesia Plan Comments:         Anesthesia Quick Evaluation

## 2013-07-31 NOTE — ED Notes (Signed)
Internal medicine in with patient and family.

## 2013-07-31 NOTE — Progress Notes (Addendum)
INITIAL NUTRITION ASSESSMENT  Patient meets the criteria for severe MALNUTRITION in the context of chronic illness with 10% weight loss in 6 months and PO intake <75% of estimated needs.   DOCUMENTATION CODES Per approved criteria  -Severe malnutrition in the context of chronic illness -Underweight   INTERVENTION: -Ensure Complete BID, each provides 350 kcal, 13 g protein, -Encourage adequate PO intake of meals  NUTRITION DIAGNOSIS: Inadequate oral intake related to decreased appetite as evidenced by poor appetite reported  Goal: Patient will meet >/=90% of estimated nutrition needs  Monitor:  PO intake, weight, labs, I/Os, wound status  Reason for Assessment: Consult  53 y.o. 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   -Patient with stage 4 sacral decubitus ulcer, requiring surgical debridement.  -Patient currently in the OR at time of visit, so unable to perform a physical exam. Per chart review, patient very thin looking, so suspect some degree of muscle and subcutaneous fat depletion. He has had a poor appetite for the last 4 months with a 10% weight loss over the last 6 months.   Height: Ht Readings from Last 1 Encounters:  07/31/13 6' 1.5" (1.867 m)    Weight: Wt Readings from Last 1 Encounters:  07/31/13 123 lb 0.3 oz (55.8 kg)    Ideal Body Weight: 187 pounds  % Ideal Body Weight: 66%  Wt Readings from Last 10 Encounters:  07/31/13 123 lb 0.3 oz (55.8 kg)  07/31/13 123 lb 0.3 oz (55.8 kg)  07/23/13 132 lb (59.875 kg)  07/09/13 131 lb 3.2 oz (59.512 kg)  02/18/13 136 lb (61.689 kg)  01/13/13 136 lb (61.689 kg)  12/24/12 145 lb (65.772 kg)  10/02/12 139 lb (63.05 kg)  09/02/12 140 lb 12.8 oz (63.866 kg)  08/07/11 139 lb (63.05 kg)    Usual Body Weight: 136 pounds  % Usual  Body Weight: 90%  BMI:  Body mass index is 16.01 kg/(m^2). Patient is underweight.   Estimated Nutritional Needs: Kcal: 2050-2200 kcal Protein: 85-95 g Fluid: >1.9 L/day  Skin: stage 4 sacrum decubitus ulcer  Diet Order: NPO  EDUCATION NEEDS: -Education not appropriate at this time   Intake/Output Summary (Last 24 hours) at 07/31/13 1348 Last data filed at 07/31/13 1344  Gross per 24 hour  Intake   4500 ml  Output    450 ml  Net   4050 ml    Last BM: PTA   Labs:   Recent Labs Lab 07/30/13 2335 07/31/13 0530  NA 131* 133*  K 4.1 3.8  CL 94* 100  CO2 24 24  BUN 20 18  CREATININE 0.92 0.78  CALCIUM 9.5 8.0*  GLUCOSE 103* 100*    CBG (last 3)  No results found for this basename: GLUCAP,  in the last 72 hours  Scheduled Meds: . [facility] HOLD] atazanavir  300 mg Oral Q breakfast  . chlorhexidine  60 mL Topical Once  . [facility] HOLD] lamiVUDine-zidovudine  1 tablet Oral BID  . nystatin  5 mL Oral QID  . [[facility] HOLD] piperacillin-tazobactam (ZOSYN)  IV  3.375 g Intravenous 3 times per day  . [facility] HOLD] ritonavir  100 mg Oral Q breakfast  . [[facility] HOLD] sodium chloride  3 mL Intravenous Q12H  . [facility] HOLD] tenofovir  300 mg Oral Daily  . [  [facility] HOLD] vancomycin  500 mg Intravenous Q8H    Continuous Infusions: . sodium chloride 125 mL/hr at 07/31/13 0800  . lactated ringers 10 mL/hr at 07/31/13 1250    Past Medical History  Diagnosis Date  . Radiation   . HIV (human immunodeficiency virus infection)   . Shingles     left shoulder, right leg  . Pain in limb 07/23/2013  . Rectal cancer     Squamous cell  . Gout     Past Surgical History  Procedure Laterality Date  . Hernia repair      right  . Hematoma evacuation      Larey Seat, RD, LDN Pager #: [phone] [place] Pager #: [phone]

## 2013-07-31 NOTE — Progress Notes (Signed)
Subjective: Mr. Blazier had no acute events since admission. He still endorses the R leg/hip pain and has his knee flexed in order to relieve pain. He verified the history as noted in his HPI. Of note, he says he first noticed the decubitus ulcer ~ 3 days ago and had been "using lysol wipes on it." He denied any current chest pain, SOB, palpitations, fever, chills, night sweats, nausea, or emesis. He says he has had the hiccups but did not have any during our interview and exam.  Objective: Vital signs in last 24 hours: Filed Vitals:   07/31/13 1000 07/31/13 1100 07/31/13 1200 07/31/13 1400  BP: 109/61 103/65 99/56   Pulse: 84 87 80   Temp:  101.3 F (38.5 C)  100.4 F (38 C)  TempSrc:  Oral    Resp: 21 17 15    Height:      Weight:      SpO2: 98% 100% 99%    Weight change:   Intake/Output Summary (Last 24 hours) at 07/31/13 1403 Last data filed at 07/31/13 1344  Gross per 24 hour  Intake   4500 ml  Output    450 ml  Net   4050 ml   BP 99/56  Pulse 80  Temp(Src) 100.4 F (38 C) (Oral)  Resp 15  Ht 6' 1.5" (1.867 m)  Wt 123 lb 0.3 oz (55.8 kg)  BMI 16.01 kg/m2  SpO2 99%  General Appearance:    Cachectic, alert, cooperative, no acute distress  HEENT:    Normocephalic, without obvious abnormality, atraumatic, PERRL, EOMI, dry mucous membranes, white plaques in posterior oropharynx  Back:     Symmetric, no curvature, well bandaged sacral decubitus ulcer  Lungs:     Clear to auscultation bilaterally, respirations unlabored  Chest wall:    No tenderness or deformity  Heart:    Regular rate and rhythm, S1 and S2 normal, no murmur, rub   or gallop  Abdomen:     Soft, non-tender, bowel sounds active all four quadrants,    no masses, no organomegaly, previous hernia scars midline and in groin  Extremities:   R leg hip and knee flexed to relieve pain. Warm and tender R hip, no erythema. No edema in either leg distal to knee   Pulses:   2+ and symmetric radial and DP pulses    Lab Results: Basic Metabolic Panel:  Recent Labs Lab 07/30/13 2335 07/31/13 0530  NA 131* 133*  K 4.1 3.8  CL 94* 100  CO2 24 24  GLUCOSE 103* 100*  BUN 20 18  CREATININE 0.92 0.78  CALCIUM 9.5 8.0*   Liver Function Tests:  Recent Labs Lab 07/30/13 2335  AST 34  ALT 21  ALKPHOS 97  BILITOT 0.7  PROT 8.3  ALBUMIN 2.0*   No results found for this basename: LIPASE, AMYLASE,  in the last 168 hours No results found for this basename: AMMONIA,  in the last 168 hours CBC:  Recent Labs Lab 07/31/13 0025 07/31/13 0530  WBC 9.4 8.4  NEUTROABS 7.7  --   HGB 8.5* 8.6*  HCT 25.8* 26.3*  MCV 102.8* 105.2*  PLT 204 196   Cardiac Enzymes: No results found for this basename: CKTOTAL, CKMB, CKMBINDEX, TROPONINI,  in the last 168 hours BNP: No results found for this basename: PROBNP,  in the last 168 hours D-Dimer: No results found for this basename: DDIMER,  in the last 168 hours CBG: No results found for this basename: GLUCAP,  in the last 168 hours Hemoglobin A1C: No results found for this basename: HGBA1C,  in the last 168 hours Fasting Lipid Panel: No results found for this basename: CHOL, HDL, LDLCALC, TRIG, CHOLHDL, LDLDIRECT,  in the last 168 hours Thyroid Function Tests: No results found for this basename: TSH, T4TOTAL, FREET4, T3FREE, THYROIDAB,  in the last 168 hours Coagulation:  Recent Labs Lab 07/31/13 0530  LABPROT 19.2*  INR 1.62*   Anemia Panel:  Recent Labs Lab 07/31/13 0610  VITAMINB12 247  FOLATE 5.5   Urine Drug Screen: Drugs of Abuse  No results found for this basename: labopia,  cocainscrnur,  labbenz,  amphetmu,  thcu,  labbarb    Alcohol Level: No results found for this basename: ETH,  in the last 168 hours Urinalysis:  Recent Labs Lab 07/31/13 0628  COLORURINE ORANGE*  LABSPEC >1.046*  PHURINE 8.5*  GLUCOSEU NEGATIVE  HGBUR LARGE*  BILIRUBINUR SMALL*  KETONESUR NEGATIVE  PROTEINUR >300*  UROBILINOGEN 2.0*  NITRITE  POSITIVE*  LEUKOCYTESUR SMALL*   Misc. Labs: Vit b12 247 Folate 5.5 CD4 140, 20% helper T-cell HIV1 RNA quant pending  Micro Results: Recent Results (from the past 240 hour(s))  MRSA PCR SCREENING     Status: None   Collection Time    07/31/13  3:47 AM      Result Value Ref Range Status   MRSA by PCR NEGATIVE  NEGATIVE Final   Comment:            The GeneXpert MRSA Assay (FDA     approved for NASAL specimens     only), is one component of a     comprehensive MRSA colonization     surveillance program. It is not     intended to diagnose MRSA     infection nor to guide or     monitor treatment for     MRSA infections.   Studies/Results: Ct Pelvis W Contrast  07/31/2013   CLINICAL DATA:  Right hip pain. Per EPIC notes, outside "CT scan of a right hip shows evidence of an infection around the hip and tracking into the pelvis along the iliopsoas muscle. The patient will require admission to the hospital, I indicated that he should go to the emergency room tonight or tomorrow morning and seek medical attention".  EXAM: CT PELVIS WITH CONTRAST  TECHNIQUE: Multidetector CT imaging of the pelvis was performed using the standard protocol following the bolus administration of intravenous contrast.  CONTRAST:  170mL OMNIPAQUE IOHEXOL 300 MG/ML  SOLN  COMPARISON:  Abdominal pelvic CT 05/07/2007 and 02/25/2003. Outside study is unavailable.  FINDINGS: There is a large complex right hip joint effusion with fluid tracking into the iliopsoas bursa. There is a small amount of air anteriorly within the joint on image 85. Complex fluid within the iliopsoas bursa extends superiorly along the right iliacus muscle, measuring up to 8.9 x 5.2 cm transverse on image 48.  Since the prior CT, the patient has developed marked right hip joint space loss with multifocal destruction of the acetabulum consistent with osteomyelitis. The femoral head demonstrates mild rarefaction without gross cortical destruction or  subchondral collapse. There is no dislocation. Apart from the acetabulum, there is no evidence of pelvic osteomyelitis. The sacroiliac joints and lower lumbar spine appear intact.  There is decubitus ulceration over the lower sacrum and coccyx with associated soft tissue emphysema. There is no gross destruction of the sacrum or coccyx. There appears to be some induration of the skin posterolateral  to the right femoral greater trochanter.  Chronic bladder wall thickening appear stable.  IMPRESSION: 1. 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 There is probable early osteomyelitis of the femoral head. 2. Associated infected iliopsoas bursa with superior extension of a large abscess into the pelvis between the iliacus muscle and the right iliac bone. 3. Decubitus ulcer over the lower sacrum and coccyx.   Electronically Signed   By: Camie Patience M.D.   On: 07/31/2013 02:42   Medications: I have reviewed the patient's current medications. Scheduled Meds: . [facility] HOLD] atazanavir  300 mg Oral Q breakfast  . chlorhexidine  60 mL Topical Once  . feeding supplement (ENSURE COMPLETE)  237 mL Oral BID BM  . [facility] HOLD] lamiVUDine-zidovudine  1 tablet Oral BID  . nystatin  5 mL Oral QID  . [[facility] HOLD] piperacillin-tazobactam (ZOSYN)  IV  3.375 g Intravenous 3 times per day  . [facility] HOLD] ritonavir  100 mg Oral Q breakfast  . [MAR HOLD] sodium chloride  3 mL Intravenous Q12H  . sulfamethoxazole-trimethoprim  1 tablet Oral Daily  . [facility] HOLD] tenofovir  300 mg Oral Daily  . [facility] HOLD] vancomycin  500 mg Intravenous Q8H   Continuous Infusions: . sodium chloride 125 mL/hr at 07/31/13 0800  . lactated ringers 10 mL/hr at 07/31/13 1250   PRN Meds:.[facility] HOLD]  HYDROmorphone (DILAUDID) injection, [MAR HOLD] ondansetron (ZOFRAN) IV, [MAR HOLD] ondansetron Assessment/Plan: Principal Problem:   Septic arthritis of hip Active Problems:   HIV DISEASE   ANEMIA,  CHRONIC   Decubitus ulcer of sacral region, stage 4  Septic Arthritis of Right Hip in setting of stage 4 decubitus ulcer: Patient has septic arthritis of R hip in the setting of a stage 4 sacral decubitus ulcer. He has a fever of 101.3, was tachycardic to 106, and tachypnic to 22 without leukocytosis. His R leg and hip are painful to manipulation. CT pelvis demonstrated septic arthritis of the right hip with osteomyelitis of the acetabulum, early osteomyelitis of the femoral head, infected iliopsoas bursa with superior extension of a large abscess into the pelvis between the iliacus muscle and right iliac bone, and a decubitus ulcer over the lower sacrum and coccyx. He was placed on vancomycin and ceftriaxone. After speaking this morning with [name], his ID physician, ceftriaxone was changed to zosyn per pharmacy. Etiology may be hematogenous spread of bacteria from the decubitus ulcer. I spoke with his general surgeon Dr. Dalbert Batman to inform him and he referred me to Dr. Ralene Ok who will evaluate the patient. The patient was also seen by Orthopedics (Dr. [name]) who will consult Dr. Helane Gunther (pelvic specialist) and Dr. Paralee Cancel (hip specialist) for their advice concerning surgical decompression versus debridement or possibly interventional radiology percutaneous drain placement. -appreciate orthopedics, general surgery, ID, and wound team -f/u ortho and gen surg recs -Continue vancomycin and zosyn per pharmacy -f/u BCx x 2, UCx -Dilaudid 1 mg Q4H prn  -NPO  -NS 125 mL/hr  -wound care  Macrocytic Anemia: Hgb 8.4 this morning, MCV 105. Hgb baseline 12.0. Admits to weakness and dizziness. Loss of appetite. Macrocytic anemia with normal vitamin b12 and folate mostly due to anti-retroviral therapy and anemia of chronic disease.  -cont to monitor  HIV: Pt is followed by [name], ID, who was informed of hospitalization. Patient CD4 on admission is 140 with viral load pending . Viral  load <200 requires PCP prophylaxis even in  setting of vanc/zosyn. On atazanavir, combivir, norvir, and tenofovir at home. He admits he has missed a few doses recently.  -f/u HIV 1 RNA viral load  -Continue Atazanavir 300 mg daily  -Continue Lamivudine-Zidovudine 150-300 mg daily  -Continue Norvir 100 mg daily  -Continue Tenofovir 300 mg daily  -bactrim 1 double strength tab daily  History of Squamous Cell Cancer of the Anus in 2008, s/p radiation and chemotherapy: Follows with Dr. Dalbert Batman of [facility]. CT in February 2015 showed no recurrence of cancer, infection, adenopathy or hernia. CT showed post-radiation changes. Dr. Dalbert Batman informed of hospitalization.   History of DVT: LLE DVT in August of 2008. Complication of radiation and chemotherapy. No lower extremity edema, no calf tenderness. No longer on coumadin at home. PT 19.2 INR 1.62 -SCD's   Dispo: Disposition is deferred at this time, awaiting improvement of current medical problems.  Anticipated discharge in approximately 3 day(s).   The patient does have a current PCP [name], MD) and does need an [facility] hospital follow-up appointment after discharge.  The patient does not know have transportation limitations that hinder transportation to clinic appointments.  .Services Needed at time of discharge: Y = Yes, Blank = No PT:   OT:   RN:   Equipment:   Other:     LOS: 1 day   Kelby Aline, MD 07/31/2013, 2:03 PM

## 2013-07-31 NOTE — Transfer of Care (Signed)
Immediate Anesthesia Transfer of Care Note  Patient: [name] [name]  Procedure(s) Performed: Procedure(s): IRRIGATION AND DEBRIDEMENT HIP WITH PERCUTANEOUS DRAIN PLACEMENT (Right)  Patient Location: PACU  Anesthesia Type:General  Level of Consciousness: awake, alert  and oriented  Airway & Oxygen Therapy: Patient Spontanous Breathing and Patient connected to face mask oxygen  Post-op Assessment: Report given to PACU RN  Post vital signs: Reviewed and stable  Complications: No apparent anesthesia complications

## 2013-07-31 NOTE — Anesthesia Postprocedure Evaluation (Signed)
  Anesthesia Post-op Note  Patient: [name] [name]  Procedure(s) Performed: Procedure(s): IRRIGATION AND DEBRIDEMENT HIP WITH PERCUTANEOUS DRAIN PLACEMENT (Right)  Patient Location: PACU  Anesthesia Type:General  Level of Consciousness: awake  Airway and Oxygen Therapy: Patient Spontanous Breathing  Post-op Pain: mild  Post-op Assessment: Post-op Vital signs reviewed  Post-op Vital Signs: Reviewed  Last Vitals:  Filed Vitals:   07/31/13 1400  BP:   Pulse:   Temp: 38 C  Resp:     Complications: No apparent anesthesia complications

## 2013-07-31 NOTE — Consult Note (Signed)
Reason for Consult:Right hip septic arthritis Referring Physician: [name] is an 53 y.o. male.  HPI: 53 yo male with history of increasing hip pain for the last several days.  Patient reports pain beginning maybe months ago that began more as a vague regional discomfort and now has localized to the right groin and hip area.  Work up to this point has been as an outpatient with Dr [name] [name] and Dr [name] [name].  CT scan done yesterday reveals sub-acute/chronic osteo of the right hip and destruction of the anterior superior acetabulum.  I was asked to consult regarding the hip.  Current complaint is anterior hip and groin pain, relieved with hip flexion.  Past Medical History  Diagnosis Date  . Radiation   . HIV (human immunodeficiency virus infection)   . Shingles     left shoulder, right leg  . Pain in limb 07/23/2013  . Rectal cancer     Squamous cell  . Gout     Past Surgical History  Procedure Laterality Date  . Hernia repair      right  . Hematoma evacuation      Family History  Problem Relation Age of Onset  . Diabetes Mother   . Dementia Mother   . Atrial fibrillation Mother   . Hypertension Mother   . Cancer Father   . Emphysema Father     Social History:  reports that he has been smoking Cigarettes.  He has been smoking about 0.30 packs per day. He has never used smokeless tobacco. He reports that he drinks about 1.5 ounces of alcohol per week. He reports that he uses illicit drugs (Marijuana).  Allergies: No Known Allergies  Medications: I have reviewed the patient's current medications.  Results for orders placed during the hospital encounter of 07/30/13 (from the past 48 hour(s))  COMPREHENSIVE METABOLIC PANEL     Status: Abnormal   Collection Time    07/30/13 11:35 PM      Result Value Ref Range   Sodium 131 (*) 137 - 147 mEq/L   Potassium 4.1  3.7 - 5.3 mEq/L   Chloride 94 (*) 96 - 112 mEq/L   CO2 24  19 - 32 mEq/L   Glucose, Bld 103  (*) 70 - 99 mg/dL   BUN 20  6 - 23 mg/dL   Creatinine, Ser 0.92  0.50 - 1.35 mg/dL   Calcium 9.5  8.4 - 10.5 mg/dL   Total Protein 8.3  6.0 - 8.3 g/dL   Albumin 2.0 (*) 3.5 - 5.2 g/dL   AST 34  0 - 37 U/L   ALT 21  0 - 53 U/L   Alkaline Phosphatase 97  39 - 117 U/L   Total Bilirubin 0.7  0.3 - 1.2 mg/dL   GFR calc non Af Amer >90  >90 mL/min   GFR calc Af Amer >90  >90 mL/min   Comment: (NOTE)     The eGFR has been calculated using the CKD EPI equation.     This calculation has not been validated in all clinical situations.     eGFR's persistently <90 mL/min signify possible Chronic Kidney     Disease.   Anion gap 13  5 - 15  I-STAT CG4 LACTIC ACID, ED     Status: None   Collection Time    07/30/13 11:42 PM      Result Value Ref Range   Lactic Acid, Venous 1.58  0.5 - 2.2 mmol/L  CBC WITH DIFFERENTIAL     Status: Abnormal   Collection Time    07/31/13 12:25 AM      Result Value Ref Range   WBC 9.4  4.0 - 10.5 K/uL   RBC 2.51 (*) 4.22 - 5.81 MIL/uL   Hemoglobin 8.5 (*) 13.0 - 17.0 g/dL   HCT 25.8 (*) 39.0 - 52.0 %   MCV 102.8 (*) 78.0 - 100.0 fL   MCH 33.9  26.0 - 34.0 pg   MCHC 32.9  30.0 - 36.0 g/dL   RDW 14.7  11.5 - 15.5 %   Platelets 204  150 - 400 K/uL   Neutrophils Relative % 82 (*) 43 - 77 %   Lymphocytes Relative 9 (*) 12 - 46 %   Monocytes Relative 8  3 - 12 %   Eosinophils Relative 1  0 - 5 %   Basophils Relative 0  0 - 1 %   Neutro Abs 7.7  1.7 - 7.7 K/uL   Lymphs Abs 0.8  0.7 - 4.0 K/uL   Monocytes Absolute 0.8  0.1 - 1.0 K/uL   Eosinophils Absolute 0.1  0.0 - 0.7 K/uL   Basophils Absolute 0.0  0.0 - 0.1 K/uL   WBC Morphology TOXIC GRANULATION    I-STAT VENOUS BLOOD GAS, ED     Status: Abnormal   Collection Time    07/31/13 12:32 AM      Result Value Ref Range   pH, Ven 7.409 (*) 7.250 - 7.300   pCO2, Ven 42.3 (*) 45.0 - 50.0 mmHg   pO2, Ven 26.0 (*) 30.0 - 45.0 mmHg   Bicarbonate 26.8 (*) 20.0 - 24.0 mEq/L   TCO2 28  0 - 100 mmol/L   O2 Saturation  48.0     Acid-Base Excess 2.0  0.0 - 2.0 mmol/L   Collection site BRACHIAL ARTERY     Sample type VENOUS     Comment NOTIFIED PHYSICIAN      Ct Pelvis W Contrast  07/31/2013   CLINICAL DATA:  Right hip pain. Per EPIC notes, outside "CT scan of a right hip shows evidence of an infection around the hip and tracking into the pelvis along the iliopsoas muscle. The patient will require admission to the hospital, I indicated that he should go to the emergency room tonight or tomorrow morning and seek medical attention".  EXAM: CT PELVIS WITH CONTRAST  TECHNIQUE: Multidetector CT imaging of the pelvis was performed using the standard protocol following the bolus administration of intravenous contrast.  CONTRAST:  159m OMNIPAQUE IOHEXOL 300 MG/ML  SOLN  COMPARISON:  Abdominal pelvic CT 05/07/2007 and 02/25/2003. Outside study is unavailable.  FINDINGS: There is a large complex right hip joint effusion with fluid tracking into the iliopsoas bursa. There is a small amount of air anteriorly within the joint on image 85. Complex fluid within the iliopsoas bursa extends superiorly along the right iliacus muscle, measuring up to 8.9 x 5.2 cm transverse on image 48.  Since the prior CT, the patient has developed marked right hip joint space loss with multifocal destruction of the acetabulum consistent with osteomyelitis. The femoral head demonstrates mild rarefaction without gross cortical destruction or subchondral collapse. There is no dislocation. Apart from the acetabulum, there is no evidence of pelvic osteomyelitis. The sacroiliac joints and lower lumbar spine appear intact.  There is decubitus ulceration over the lower sacrum and coccyx with associated soft tissue emphysema. There is no gross destruction of the sacrum or coccyx. There  appears to be some induration of the skin posterolateral to the right femoral greater trochanter.  Chronic bladder wall thickening appear stable.  IMPRESSION: 1. Findings are  consistent with septic arthritis of the right hip with osteomyelitis of the acetabulum. There is probable early osteomyelitis of the femoral head. 2. Associated infected iliopsoas bursa with superior extension of a large abscess into the pelvis between the iliacus muscle and the right iliac bone. 3. Decubitus ulcer over the lower sacrum and coccyx.   Electronically Signed   By: Camie Patience M.D.   On: 07/31/2013 02:42    ROS Blood pressure 104/65, pulse 76, temperature 98 F (36.7 C), temperature source Oral, resp. rate 17, height 6' 1.5" (1.867 m), weight 55.8 kg (123 lb 0.3 oz), SpO2 100.00%. Physical Exam  Patient is a malnourished appearing male who is fairly comfortable in bed with his hip flexed up.  Increased pain with hip extension.  Right pelvis and hip area examined: healed surgical incision right inguinal area.  Fullness to the right pelvic brim area, no real tenderness.  Skin intact. No fluctuence.leg lengths equal.  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  This is a very difficult clinical situation for [name] [name] as his hip appears to be the initial site for infection which has now extended into the pelvis.  I would contact Dr Dalbert Batman as he knows the patient well and seek his advice on the intrapelvic component of the infection.  I will consult with Dr Helane Gunther our pelvic specialist and Dr Paralee Cancel, one of our hip specialists for their advice concerning surgical decompression versus debridement or possibly interventional radiology percutaneous drain placement.  Thank you.  [name] [name],[name] [name] 07/31/2013, 4:01 AM

## 2013-07-31 NOTE — Progress Notes (Signed)
Utilization review completed.  

## 2013-07-31 NOTE — ED Notes (Signed)
Patient to CT.

## 2013-07-31 NOTE — H&P (Signed)
INTERNAL MEDICINE TEACHING ATTENDING NOTE  Day 1 of stay  Patient name: [name] [name]  MRN: [id number] Date of birth: 1960-01-25   54 y.o.HIV positive, history of SCC anus, s/p radiation, admitted with Right hip chronic septic arthrtitis with extensive joint destruction and intrapelvic extension. I have read the HPI by Dr Marvel Plan and I agree with it.   When I met the patient today he was in considerable pain. He was not able to move from one position to other. He is lying in bed, looks cachectic, minimally moving with his hips and knees in flexed position. He is exquisitely tender to touch on his right hip area, and right thigh. There is no swelling of the right  thigh or hip region as compared to the left side, and no erythema. Also has a sacral ulcer (stage 4 per prior notes) which was bandaged and the patient could not have possibly easily moved for me to see it. His other systemic exam is per Dr Nena Alexander note.   I have reviewed the chart, lab results, EKG, imaging and relevant notes of this patient.   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 Awaiting input. On Vancomycin, and Zocyn IV.   HIV and osteomyelitis and sacral ulcer - On broad spectrum coverage right now. Would get ID input about HIV meds.   Malnutrition - Nutrition consult.  I would also add a quantiferon TB test.   I have seen and evaluated this patient and discussed it with my IM resident team.  Please see the rest of the plan per resident note from today.   [place], Jay 07/31/2013, 1:51 PM.

## 2013-07-31 NOTE — ED Notes (Signed)
Dr. Cheri Guppy notified of VBG

## 2013-07-31 NOTE — H&P (Signed)
Date: 07/31/2013               Patient Name:  [name] [name] MRN: [id number]  DOB: September 16, 1960 Age / Sex: 53 y.o., male   PCP: [name], MD         Medical Service: Internal Medicine Teaching Service         Attending Physician: Dr. Madilyn Fireman, MD    First Contact: Dr. Ethelene Hal Pager: [phone]  Second Contact: Dr. 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 Jannifer Franklin. The patient was recently seen by Dr. Jannifer Franklin in outpatient neurology for evaluation of questionable neuropathic pain and had a CT R hip completed. According to note, CT showed evidence of an infection around the R hip and tracking into the pelvis along the iliopsoas muscle. Dr. Jannifer Franklin called and instructed pt and pt's sister to go to the ED.   Pt states that in December 2014, pt woke up with severe R hip pain radiating into his groin. At the time he did not have health insurance and did not have the leg evaluated. He was not able to put weight on it, but the pain resolved in 3-4 days. 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 On June 26, 2013, pt woke up again with R hip and leg pain that radiated into his R groin. Pain is worst with weight bearing and with hip and knee extension. His pain worsened over the next month and he went to be evaluated by Dr. Dalbert Batman, surgery. According to his note, CT scan in February 2015 showed no evidence of recurrent cancer, infection, adenopathy or hernia. He was referred to neurology for neuropathic pain. He was evaluated in the ED on July 19, 2013. He was again diagnosed with neuropathy, prescribed  Robaxin and set up with an outpatient neurology appointment. Pt saw Dr. Jannifer Franklin, neurology on July 23, 2013 for his R leg pain. Dr. 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 He had a CT scan done and pt was sent home with Diluadid for pain. The patient states over the past 3-4 he has not been able to put any weight on the R hip. Pain is 10/10, worse with movement and weight bearing. He has been immobile for the past 3-4 days. Pt denies IV drug use. Admits to nausea. He denies any fever, chills, chest pain, shortness of breath, or vomiting.   Meds: Current Facility-Administered Medications  Medication Dose Route Frequency Provider Last Rate Last Dose  . 0.9 %  sodium chloride infusion   Intravenous Continuous Corky Sox, MD      . atazanavir (REYATAZ) capsule 300 mg  300 mg Oral Q breakfast Corky Sox, MD      . cefTRIAXone (ROCEPHIN) 1 g in dextrose 5 % 50 mL IVPB  1 g Intravenous Q12H Rogue Bussing, [facility]      . HYDROmorphone (DILAUDID) injection 1 mg  1 mg Intravenous Q4H PRN Ledell Noss  Bettey Costa, MD      . Derrill Memo ON 08/01/2013] lamiVUDine-zidovudine (COMBIVIR) 150-300 MG per tablet 1 tablet  1 tablet Oral BID Corky Sox, MD      . ondansetron [facility]) tablet 4 mg  4 mg Oral Q6H PRN Corky Sox, MD       Or  . ondansetron [facility]) injection 4 mg  4 mg Intravenous Q6H PRN Corky Sox, MD      . ritonavir (NORVIR) tablet 100 mg  100 mg Oral Q breakfast Corky Sox, MD      . sodium chloride 0.9 % bolus 1,000 mL  1,000 mL Intravenous Once Corky Sox, MD      . sodium chloride 0.9 % injection 3 mL  3 mL Intravenous Q12H Corky Sox, MD      . tenofovir [facility] Holes) tablet 300 mg  300 mg Oral Daily Corky Sox, MD      . vancomycin (VANCOCIN) 500 mg in sodium chloride 0.9 % 100 mL IVPB  500 mg Intravenous Q8H Veronda Francee Nodal, [facility]        Allergies: Allergies as of 07/30/2013  . (No Known Allergies)    Past Medical History  Diagnosis Date  . Radiation   . HIV (human immunodeficiency virus infection)   . Shingles     left shoulder, right leg  . Pain in limb 07/23/2013  . Rectal cancer     Squamous cell  . Gout    Past Surgical History  Procedure Laterality Date  . Hernia repair      right  . Hematoma evacuation     Family History  Problem Relation Age of Onset  . Diabetes Mother   . Dementia Mother   . Atrial fibrillation Mother   . Hypertension Mother   . Cancer Father   . Emphysema Father    History   Social History  . Marital Status: Single    Spouse Name: N/A    Number of Children: 0  . Years of Education: BA   Occupational History  . disabled    Social History Main Topics  . Smoking status: Current Every Day Smoker -- 0.30 packs/day    Types: Cigarettes  . Smokeless tobacco: Never Used  . Alcohol Use: 1.5 oz/week    3 drink(s) per week     Comment: occasional  . Drug Use: Yes    Special: Marijuana     Comment: daily  . Sexual Activity: No     Comment: declined condoms   Other Topics Concern  . Not on file   Social History Narrative  . No narrative on file    Review of Systems: General: Admits 8-10 pound weight loss in the last 4 months, fatigue, decreased appetitie and weakness. Denies fever, chills, diaphoresis.Marland Kitchen Respiratory: Denies SOB, DOE, cough, chest tightness, and wheezing.   Cardiovascular: Denies chest pain and palpitations.  Gastrointestinal: Admits to nausea and hiccups, denies vomiting, abdominal pain, diarrhea, constipation, blood in stool and abdominal distention.  Genitourinary: Denies dysuria, urgency, frequency, hematuria, and flank pain. Endocrine: Denies hot or cold intolerance, polyuria, and polydipsia. Musculoskeletal: Admits to R hip and leg pain with radiation into groin, Denies myalgias, back pain, joint swelling.  Skin: Admits to ulcer over right buttock. Denies any bites, trauma to skin or rashes. Neurological:  Admits to dizziness and weakness, Denies seizures, syncope, numbness and headaches.  Psychiatric/Behavioral: Denies mood changes, confusion, nervousness, sleep disturbance and agitation.  Physical Exam: Filed Vitals:  07/31/13 0100 07/31/13 0155 07/31/13 0301 07/31/13 0325  BP: 110/64 98/65 91/51  104/65  Pulse: 90 85 72 76  Temp:    98 F (36.7 C)  TempSrc:    Oral  Resp: 15 19 20 17   Height:    6' 1.5" (1.867 m)  Weight:    123 lb 0.3 oz (55.8 kg)  SpO2: 99% 95% 100% 100%   General: Vital signs reviewed.  Patient is a thin, cachetic, chronically ill appearing male, in no acute distress and cooperative with exam.  Head: Normocephalic and atraumatic. Dry oral mucosa. No evidence of oral candidiasis. Eyes: PERRL, EOMI, conjunctivae normal, No scleral icterus.  Neck: Supple, trachea midline, normal ROM, No JVD, masses, thyromegaly, or carotid bruit present.  Cardiovascular: RRR, S1 normal, S2 normal, no murmurs, gallops, or rubs. Pulmonary/Chest: CTA bilaterally, no wheezes, rales, or rhonchi. Abdominal: Soft, non-tender, non-distended, decreased BS, R groin depressed scar from previous hernia surgery and hematoma evacuation, no masses, organomegaly, or guarding present.  Musculoskeletal: Severe pain with manipulation of R leg in any direction. Pain is worst over lateral hip over R femoral head area. Pt is most comfortable when hip is flexed and knee is flexed. Increased warmth over R hip, but no erythematous changes. Tender to palpation over R hip joint. Extremities: Trace pitting lower extremity edema bilaterally, pulses symmetric and intact bilaterally. No cyanosis or clubbing. Neurological: A&O x3, Strength is normal and symmetric bilaterally, cranial nerve II-XII are grossly intact, no focal motor deficit, sensory intact to light touch bilaterally.  Skin: Stage IV R sacral decubitus ulcer with a 6 cm diameter with exposed bone, muscle and tendon. Black eschar within ulcer with raised  sacral bone visible beneath. Ulcer has mild purulent material and skin sloughing with a large surrounding area of erythema. No other areas of ulceration.  Psychiatric: Normal mood and affect. speech and behavior is normal. Cognition and memory are normal.   Lab results: Basic Metabolic Panel:  Recent Labs  07/30/13 2335  NA 131*  K 4.1  CL 94*  CO2 24  GLUCOSE 103*  BUN 20  CREATININE 0.92  CALCIUM 9.5   Liver Function Tests:  Recent Labs  07/30/13 2335  AST 34  ALT 21  ALKPHOS 97  BILITOT 0.7  PROT 8.3  ALBUMIN 2.0*   No results found for this basename: LIPASE, AMYLASE,  in the last 72 hours No results found for this basename: AMMONIA,  in the last 72 hours CBC:  Recent Labs  07/31/13 0025  WBC 9.4  NEUTROABS 7.7  HGB 8.5*  HCT 25.8*  MCV 102.8*  PLT 204     Imaging results:  Ct Pelvis W Contrast  07/31/2013   CLINICAL DATA:  Right hip pain. Per EPIC notes, outside "CT scan of a right hip shows evidence of an infection around the hip and tracking into the pelvis along the iliopsoas muscle. The patient will require admission to the hospital, I indicated that he should go to the emergency room tonight or tomorrow morning and seek medical attention".  EXAM: CT PELVIS WITH CONTRAST  TECHNIQUE: Multidetector CT imaging of the pelvis was performed using the standard protocol following the bolus administration of intravenous contrast.  CONTRAST:  148mL OMNIPAQUE IOHEXOL 300 MG/ML  SOLN  COMPARISON:  Abdominal pelvic CT 05/07/2007 and 02/25/2003. Outside study is unavailable.  FINDINGS: There is a large complex right hip joint effusion with fluid tracking into the iliopsoas bursa. There is a small amount of air anteriorly within the  joint on image 85. Complex fluid within the iliopsoas bursa extends superiorly along the right iliacus muscle, measuring up to 8.9 x 5.2 cm transverse on image 48.  Since the prior CT, the patient has developed marked right hip joint space loss  with multifocal destruction of the acetabulum consistent with osteomyelitis. The femoral head demonstrates mild rarefaction without gross cortical destruction or subchondral collapse. There is no dislocation. Apart from the acetabulum, there is no evidence of pelvic osteomyelitis. The sacroiliac joints and lower lumbar spine appear intact.  There is decubitus ulceration over the lower sacrum and coccyx with associated soft tissue emphysema. There is no gross destruction of the sacrum or coccyx. There appears to be some induration of the skin posterolateral to the right femoral greater trochanter.  Chronic bladder wall thickening appear stable.  IMPRESSION: 1. 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 There is probable early osteomyelitis of the femoral head. 2. Associated infected iliopsoas bursa with superior extension of a large abscess into the pelvis between the iliacus muscle and the right iliac bone. 3. Decubitus ulcer over the lower sacrum and coccyx.   Electronically Signed   By: Camie Patience M.D.   On: 07/31/2013 02:42    Assessment & Plan by Problem:  Septic Arthritis of Right Hip: Pt presents with 1 month history of increasing R hip and leg pain. He has been unable to bear weight on leg for the past 3-4 days. 10/10 pain currently. Worse with manipulation of leg and weight bearing. Best position is hip flexion and knee flexion. He admits to nausea, but denies fevers, chills, or vomiting. Vital signs on admission show T 100.5--> 101.2, BP 85/62, HR 106, RR 14, Pulse ox 100% on room air. On physical exam, pt is in severe pain with manipulation of R extremity. CT pelvis with contrast shows septic arthritis of the right hip with osteomyelitis of the acetabulum, early osteomyelitis of the femoral head, infected iliopsoas bursa with superior extension of a large abscess into the pelvis between the iliacus muscle and right iliac bone, and a decubitus  ulcer over the lower sacrum and coccyx. Pt received vancomycin 1 gram IV, ceftriaxone 2 grams, and dilaudid in the ED. The etiology of the septic arthritis is inconclusive. Pt does have a Stage IV right sacral decubitus for unknown length of time that has not been treated. Pt has predisposing factors of this skin infection, but no other factors such as age >77, DM, RA, prosthetic joint, recent joint surgery, IV drug abuse or corticosteroid injections. Bacteremia is more likely to occur in a joint with preexisting arthritis or in patients with gout. Our patient does not have an official diagnosis of arthritis, but does have a history of gout. Hematogenous spread to the joint is more likely in immunocompromised hosts, such as HIV, as in this patient. Pt also has history of radiation to the pelvic region which could be a contributing factor to sacral decubitus ulcer and/or septic arthritis. Pt has extensive history of dental caries. This cause potential endocarditis and, notably, septic arthritis can be the presenting sign of endocarditis, so an echocardiogram may be warranted. -Continue Ceftriaxone and Vancomycin per pharmacy until culture -Dilaudid 1 mg Q4H prn -Consult Infectious Disease -Consult Surgery-Please contact Dr. Dalbert Batman of [facility] as he knows the patient well. Seek his advice on the intrapelvic component of the infection. Office phone number: [phone] -Consult Orthopedic Surgery: Seen by Dr. [name]. He will consult  Dr. Helane Gunther (pelvic specialist) and Dr. Paralee Cancel (hip specialist) for their advice concerning surgical decompression versus debridement or possibly interventional radiology percutaneous drain placement -NPO -Repeat CBC/BMP in am -Urinalysis with urine culture -Consider echocardiogram -NS 125 mL/hr  Stage IV R Decubitus Ulcer: For unknown amount of time. Pt states "1 week," but clearly chronic in nature. No evidence that it has been evaluated previously. Pt  has been more immobile recently and is a very thing appearing male, but states he is active and works at a [employer]. It is unclear why the patient has this ulcer since he does not seem to be bedridden.  -Consult Wound Care -Continue Ceftriaxone and Vancomycin per pharmacy  Macrocytic Anemia: Hgb 8.5 on admission, MCV 102.8. Hgb baseline 12.0. Admits to weakness and dizziness. Loss of appetite. Macrocytic anemia mostly due to anti-retroviral therapy and anemia of chronic disease. -Check Vitamin B 12 and Folate -Monitor  HIV: Pt is followed by [name], ID. Most recent CD4 count is 230 and viral load is 252 on 03/29/2103. On atazanavir, combivir, norvir, and tenofovir at home. He admits he has missed a few doses recently. Pt has had 8-10 pound weight loss in last 4 months. Denies cough or shortness of breath. Admits to weakness. -HIV 1 RNA viral load -CD4 count -Continue Atazanavir 300 mg daily -Continue Lamivudine-Zidovudine 150-300 mg daily -Continue Norvir 100 mg daily -Continue Tenofovir 300 mg daily  History of Squamous Cell Cancer of the Anus in 2008, s/p radiation and chemotherapy: Follows with Dr. Dalbert Batman of [facility]. CT in February 2015 showed no recurrence of cancer, infection, adenopathy or hernia. CT showed post-radiation changes.  -Please call Dr. Dalbert Batman to inform him that the patient has been admitted to the hospital. Office Number: [phone]  History of DVT: LLE DVT in August of 2008. Complication of radiation and chemotherapy. No lower extremity edema, no calf tenderness. No longer on coumadin at home. -SCD's  -PT/INR  Dispo: Disposition is deferred at this time, awaiting improvement of current medical problems. Anticipated discharge in approximately 1-2 day(s).   The patient does have a current PCP [name], MD) and does not need an [facility] hospital follow-up appointment after discharge.  The patient does not have transportation limitations  that hinder transportation to clinic appointments.  Signed: Osa Craver, DO PGY-1 Internal Medicine Resident Pager # [phone] 07/31/2013 4:31 AM

## 2013-08-01 ENCOUNTER — Encounter (HOSPITAL_COMMUNITY): Payer: Self-pay | Admitting: Orthopedic Surgery

## 2013-08-01 ENCOUNTER — Inpatient Hospital Stay (HOSPITAL_COMMUNITY): Payer: No Typology Code available for payment source

## 2013-08-01 DIAGNOSIS — M009 Pyogenic arthritis, unspecified: Secondary | ICD-10-CM

## 2013-08-01 DIAGNOSIS — A419 Sepsis, unspecified organism: Secondary | ICD-10-CM

## 2013-08-01 DIAGNOSIS — R6521 Severe sepsis with septic shock: Secondary | ICD-10-CM

## 2013-08-01 LAB — BASIC METABOLIC PANEL
ANION GAP: 10 (ref 5–15)
Anion gap: 13 (ref 5–15)
BUN: 14 mg/dL (ref 6–23)
BUN: 18 mg/dL (ref 6–23)
CALCIUM: 6.9 mg/dL — AB (ref 8.4–10.5)
CHLORIDE: 104 meq/L (ref 96–112)
CO2: 19 mEq/L (ref 19–32)
CO2: 19 mEq/L (ref 19–32)
CREATININE: 0.7 mg/dL (ref 0.50–1.35)
Calcium: 7.2 mg/dL — ABNORMAL LOW (ref 8.4–10.5)
Chloride: 108 mEq/L (ref 96–112)
Creatinine, Ser: 0.69 mg/dL (ref 0.50–1.35)
GFR calc Af Amer: >90 mL/min (ref >90)
GFR calc non Af Amer: >90 mL/min (ref >90)
GFR calc non Af Amer: >90 mL/min (ref >90)
GLUCOSE: 100 mg/dL — AB (ref 70–99)
Glucose, Bld: 89 mg/dL (ref 70–99)
Potassium: 4.2 mEq/L (ref 3.7–5.3)
Potassium: 3.7 mEq/L (ref 3.7–5.3)
SODIUM: 140 meq/L (ref 137–147)
Sodium: 133 mEq/L — ABNORMAL LOW (ref 137–147)

## 2013-08-01 LAB — POCT I-STAT 3, ART BLOOD GAS (G3+)
Acid-base deficit: 9 mmol/L — ABNORMAL HIGH (ref 0.0–2.0)
BICARBONATE: 16.9 meq/L — AB (ref 20.0–24.0)
O2 Saturation: 94 %
TCO2: 18 mmol/L (ref 0–100)
pCO2 arterial: 33.9 mmHg — ABNORMAL LOW (ref 35.0–45.0)
pH, Arterial: 7.305 — ABNORMAL LOW (ref 7.350–7.450)
pO2, Arterial: 76 mmHg — ABNORMAL LOW (ref 80.0–100.0)

## 2013-08-01 LAB — GLUCOSE, CAPILLARY
GLUCOSE-CAPILLARY: 107 mg/dL — AB (ref 70–99)
Glucose-Capillary: 92 mg/dL (ref 70–99)
Glucose-Capillary: 111 mg/dL — ABNORMAL HIGH (ref 70–99)

## 2013-08-01 LAB — CBC WITH DIFFERENTIAL/PLATELET
BASOS PCT: 0 % (ref 0–1)
Basophils Absolute: 0 10*3/uL (ref 0.0–0.1)
EOS ABS: 0.1 10*3/uL (ref 0.0–0.7)
EOS PCT: 1 % (ref 0–5)
HCT: 27.2 % — ABNORMAL LOW (ref 39.0–52.0)
Hemoglobin: 9 g/dL — ABNORMAL LOW (ref 13.0–17.0)
LYMPHS ABS: 0.5 10*3/uL — AB (ref 0.7–4.0)
Lymphocytes Relative: 4 % — ABNORMAL LOW (ref 12–46)
MCH: 32.5 pg (ref 26.0–34.0)
MCHC: 33.1 g/dL (ref 30.0–36.0)
MCV: 98.2 fL (ref 78.0–100.0)
Monocytes Absolute: 0.5 10*3/uL (ref 0.1–1.0)
Monocytes Relative: 4 % (ref 3–12)
Neutro Abs: 11 10*3/uL — ABNORMAL HIGH (ref 1.7–7.7)
Neutrophils Relative %: 91 % — ABNORMAL HIGH (ref 43–77)
Platelets: 145 10*3/uL — ABNORMAL LOW (ref 150–400)
RBC: 2.77 MIL/uL — AB (ref 4.22–5.81)
RDW: 16.7 % — ABNORMAL HIGH (ref 11.5–15.5)
WBC: 12.2 10*3/uL — ABNORMAL HIGH (ref 4.0–10.5)

## 2013-08-01 LAB — URINALYSIS, ROUTINE W REFLEX MICROSCOPIC
Bilirubin Urine: NEGATIVE
GLUCOSE, UA: NEGATIVE mg/dL
Ketones, ur: NEGATIVE mg/dL
Nitrite: POSITIVE — AB
Protein, ur: NEGATIVE mg/dL
Specific Gravity, Urine: 1.014 (ref 1.005–1.030)
Urobilinogen, UA: 2 mg/dL — ABNORMAL HIGH (ref 0.0–1.0)
pH: 6.5 (ref 5.0–8.0)

## 2013-08-01 LAB — CBC
HCT: 18.4 % — ABNORMAL LOW (ref 39.0–52.0)
Hemoglobin: 6.1 g/dL — CL (ref 13.0–17.0)
MCH: 34.7 pg — ABNORMAL HIGH (ref 26.0–34.0)
MCHC: 33.2 g/dL (ref 30.0–36.0)
MCV: 104.5 fL — ABNORMAL HIGH (ref 78.0–100.0)
PLATELETS: 130 10*3/uL — AB (ref 150–400)
RBC: 1.76 MIL/uL — ABNORMAL LOW (ref 4.22–5.81)
RDW: 15 % (ref 11.5–15.5)
WBC: 8.8 10*3/uL (ref 4.0–10.5)

## 2013-08-01 LAB — URINE CULTURE
CULTURE: NO GROWTH
Colony Count: NO GROWTH

## 2013-08-01 LAB — PREPARE RBC (CROSSMATCH)

## 2013-08-01 LAB — TROPONIN I: Troponin I: 0.31 ng/mL (ref <0.30)

## 2013-08-01 LAB — ABO/RH: ABO/RH(D): B POS

## 2013-08-01 LAB — URINE MICROSCOPIC-ADD ON

## 2013-08-01 LAB — LACTIC ACID, PLASMA: Lactic Acid, Venous: 0.8 mmol/L (ref 0.5–2.2)

## 2013-08-01 MED ORDER — SODIUM CHLORIDE 0.9 % IV BOLUS (SEPSIS): 500.0000 mL | Freq: Once | INTRAVENOUS | Status: DC

## 2013-08-01 MED ORDER — KETOROLAC TROMETHAMINE 30 MG/ML IJ SOLN
30.0000 mg | Freq: Once | INTRAMUSCULAR | Status: AC
  Administered 2013-08-01: 30 mg via INTRAVENOUS
  Filled 2013-08-01: qty 1

## 2013-08-01 MED ORDER — SODIUM CHLORIDE 0.9 % IV BOLUS (SEPSIS)
1000.0000 mL | INTRAVENOUS | Status: DC | PRN
  Administered 2013-08-01: 1000 mL via INTRAVENOUS

## 2013-08-01 MED ORDER — ATROPINE SULFATE 0.1 MG/ML IJ SOLN
INTRAMUSCULAR | Status: AC
Start: 2013-08-01 — End: 2013-08-02
  Filled 2013-08-01: qty 10

## 2013-08-01 MED ORDER — SODIUM CHLORIDE 0.9 % IV BOLUS (SEPSIS)
1000.0000 mL | Freq: Once | INTRAVENOUS | Status: AC
  Administered 2013-08-01: 1000 mL via INTRAVENOUS

## 2013-08-01 MED ORDER — ATROPINE SULFATE 0.4 MG/ML IJ SOLN: 0.4000 mg | INTRAMUSCULAR | Status: DC | PRN

## 2013-08-01 MED ORDER — DOPAMINE-DEXTROSE 3.2-5 MG/ML-% IV SOLN
INTRAVENOUS | Status: AC
  Administered 2013-08-01: 10 ug/kg/min via INTRAVENOUS
  Filled 2013-08-01: qty 250

## 2013-08-01 MED ORDER — SODIUM CHLORIDE 0.9 % IV SOLN
0.0300 [IU]/min | INTRAVENOUS | Status: DC
  Filled 2013-08-01: qty 2.5

## 2013-08-01 MED ORDER — DOPAMINE-DEXTROSE 3.2-5 MG/ML-% IV SOLN: 2.0000 ug/kg/min | INTRAVENOUS | Status: DC

## 2013-08-01 MED ORDER — NOREPINEPHRINE BITARTRATE 1 MG/ML IV SOLN
5.0000 ug/min | INTRAVENOUS | Status: DC
  Administered 2013-08-01: 10 ug/min via INTRAVENOUS
  Filled 2013-08-01: qty 4

## 2013-08-01 MED ORDER — DOPAMINE-DEXTROSE 3.2-5 MG/ML-% IV SOLN
2.0000 ug/kg/min | INTRAVENOUS | Status: DC
  Administered 2013-08-01 – 2013-08-04 (×4): 10 ug/kg/min via INTRAVENOUS
  Administered 2013-08-06: 6 ug/kg/min via INTRAVENOUS
  Filled 2013-08-01 (×4): qty 250

## 2013-08-01 MED ORDER — DEXTROSE 5 % IV SOLN
5.0000 ug/min | INTRAVENOUS | Status: DC
  Filled 2013-08-01: qty 4

## 2013-08-01 MED ORDER — COLLAGENASE 250 UNIT/GM EX OINT
TOPICAL_OINTMENT | Freq: Every day | CUTANEOUS | Status: DC
  Administered 2013-08-02 – 2013-08-03 (×2): via TOPICAL
  Administered 2013-08-05: 1 via TOPICAL
  Administered 2013-08-06: 10:00:00 via TOPICAL
  Administered 2013-08-07: 1 via TOPICAL
  Administered 2013-08-08 – 2013-08-10 (×2): via TOPICAL
  Filled 2013-08-01: qty 30

## 2013-08-01 MED ORDER — ATROPINE SULFATE 1 MG/ML IJ SOLN: 0.4000 mg | Freq: Once | INTRAMUSCULAR | Status: DC

## 2013-08-01 MED ORDER — SODIUM CHLORIDE 0.9 % IV BOLUS (SEPSIS): 1000.0000 mL | INTRAVENOUS | Status: DC | PRN

## 2013-08-01 MED ORDER — ATROPINE SULFATE 1 MG/ML IJ SOLN
0.4000 mg | INTRAMUSCULAR | Status: DC | PRN
  Filled 2013-08-01: qty 0.4

## 2013-08-01 MED ORDER — VANCOMYCIN HCL 500 MG IV SOLR
500.0000 mg | Freq: Three times a day (TID) | INTRAVENOUS | Status: DC
  Administered 2013-08-01 – 2013-08-02 (×5): 500 mg via INTRAVENOUS
  Filled 2013-08-01 (×6): qty 500

## 2013-08-01 MED ORDER — ACETAMINOPHEN 325 MG PO TABS: 650.0000 mg | ORAL_TABLET | Freq: Four times a day (QID) | ORAL | Status: DC | PRN

## 2013-08-01 NOTE — Consult Note (Signed)
PULMONARY / CRITICAL CARE MEDICINE   Name: [name] [name] MRN: [id number] DOB: 1960-10-27    ADMISSION DATE:  07/30/2013 CONSULTATION DATE:  08/01/2013  REFERRING MD :  Kristine Garbe  CHIEF COMPLAINT:  Low BP  INITIAL PRESENTATION: 53 year old, HIV positive admitted 7/22 with septic arthritis of right hip and abscess extending into the pelvis, underwent I&D and drainage on 7/23 followed by septic shock, hypotensive in spite of full resuscitation. 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 There is probable early osteomyelitis of the femoral head.  Associated infected iliopsoas bursa with superior extension of a  large abscess into the pelvis between the iliacus muscle and the right iliac bone.Decubitus ulcer over the lower sacrum and coccyx.   SIGNIFICANT EVENTS: 7/23 >> Right hip/pelvis, limited I and D and placement of close suction drainage intra and extra pelvic areas [name])    HISTORY OF PRESENT ILLNESS:  53 year old, HIV positive admitted 7/22 with septic arthritis of right hip and abscess extending into the pelvis, underwent I&D and drainage on 7/23 followed by septic shock, hypotensive in spite of full resuscitation. Hence PCCM consulted 7/24. He received 4 L of fluid, hemoglobin dropped from 8.5-6.1, now getting transfused. He reports right hip pain dating back to December 2014, he underwent outpatient evaluation by neurology. He was also seen by Dr. Dalbert Batman from surgery. Initial CT scan did not show any evidence of cancer recurrence or infection. He was treated for neuropathy. When the pain persisted he underwent CT of the right hip which suggested infection and hence he was asked to go to the emergency room. He has been immobile now for 6 days. He reports sacral wound only for about a week. He has been unable to work for 2 months. He sees [name] for HIV, has known  about this for 15 years, last CD4 count was 140 on admit, has been as high as 230 in 03/2013   PAST MEDICAL HISTORY :  Past Medical History  Diagnosis Date  . Radiation   . HIV (human immunodeficiency virus infection)   . Shingles     left shoulder, right leg  . Pain in limb 07/23/2013  . Rectal cancer     Squamous cell  . Gout    Past Surgical History  Procedure Laterality Date  . Hernia repair      right  . Hematoma evacuation    . Incision and drainage hip Right 07/31/2013    Procedure: IRRIGATION AND DEBRIDEMENT HIP WITH PERCUTANEOUS DRAIN PLACEMENT;  Surgeon: [name], MD;  Location: [facility];  Service: Orthopedics;  Laterality: Right;   Prior to Admission medications   Medication Sig Start Date End Date Taking? Authorizing Provider  acetaminophen (TYLENOL) 500 MG tablet Take 1,000 mg by mouth every 6 (six) hours as needed for mild pain or moderate pain.    Yes Historical Provider, MD  atazanavir (REYATAZ) 300 MG capsule Take 300 mg by mouth daily with breakfast.   Yes Historical Provider, MD  HYDROmorphone (DILAUDID) 2 MG tablet Take 1 tablet (2 mg total) by mouth every 4 (four) hours as needed for severe pain. 07/23/13  Yes Kathrynn Ducking, MD  lamiVUDine-zidovudine (COMBIVIR) 150-300 MG per tablet Take 1 tablet by mouth 2 (two) times daily. 10/29/12  Yes [name], MD  ritonavir (NORVIR) 100 MG TABS tablet Take 1 tablet (100 mg total) by mouth daily. 10/29/12  Yes [name], MD  tenofovir (VIREAD) 300 MG tablet Take 1 tablet (300 mg total) by mouth daily. 10/29/12  Yes [name], MD   No Known Allergies  FAMILY HISTORY:  Family History  Problem Relation Age of Onset  . Diabetes Mother   . Dementia Mother   . Atrial fibrillation Mother   . Hypertension Mother   . Cancer Father   . Emphysema Father    SOCIAL HISTORY:  reports that he has been smoking Cigarettes.  He has been smoking about 0.30 packs per day. He has never used smokeless  tobacco. He reports that he drinks about 1.5 ounces of alcohol per week. He reports that he uses illicit drugs (Marijuana).  REVIEW OF SYSTEMS:  Constitutional: negative for anorexia, fevers and sweats , 10 pound weight loss in the last 4 months Eyes: negative for irritation, redness and visual disturbance  Ears, nose, mouth, throat, and face: negative for earaches, nasal congestion and sore throat  Respiratory: negative for cough, dyspnea on exertion, sputum and wheezing  Cardiovascular: negative for chest pain, dyspnea, lower extremity edema, orthopnea, palpitations and syncope  Gastrointestinal: negative for abdominal pain, constipation, diarrhea, melena, nausea and vomiting  Genitourinary:negative for dysuria, frequency and hematuria  Hematologic/lymphatic: negative for bleeding, easy bruising and lymphadenopathy  Musculoskeletal: Complains of right hip pain Neurological: negative for coordination problems, headaches and weakness  Endocrine: negative for diabetic symptoms including polydipsia, polyuria and weight loss   SUBJECTIVE:   VITAL SIGNS: Temp:  [94.5 F (34.7 C)-100.4 F (38 C)] 94.5 F (34.7 C) (07/24 1139) Pulse Rate:  [51-113] 55 (07/24 1215) Resp:  [13-28] 18 (07/24 1215) BP: (75-139)/(43-80) 82/54 mmHg (07/24 1215) SpO2:  [99 %-100 %] 100 % (07/24 1215) HEMODYNAMICS:   VENTILATOR SETTINGS:   INTAKE / OUTPUT:  Intake/Output Summary (Last 24 hours) at 08/01/13 1221 Last data filed at 08/01/13 1210  Gross per 24 hour  Intake 5078.5 ml  Output  697.5 ml  Net   4381 ml    PHYSICAL EXAMINATION: Gen. Pleasant, chronically ill-appearing, in no distress, depressed affect ENT - no lesions, no post nasal drip, pallor + Neck: No JVD, no thyromegaly, no carotid bruits Lungs: no use of accessory muscles, no dullness to percussion, clear without rales or rhonchi  Cardiovascular: Rhythm regular, heart sounds  normal, no murmurs, no peripheral edema Abdomen: soft and  non-tender, midline scar ,no hepatosplenomegaly, BS normal. Musculoskeletal: No deformities, no cyanosis or clubbing Neuro:  alert, non focal Skin:  Warm, no lesions/ rash, good cap refill, sacral decub -unstagable (at least4) 6cm x 5cm   LABS:  CBC  Recent Labs Lab 07/31/13 0025 07/31/13 0530 08/01/13 0916  WBC 9.4 8.4 8.8  HGB 8.5* 8.6* 6.1*  HCT 25.8* 26.3* 18.4*  PLT 204 196 130*   Coag's  Recent Labs Lab 07/31/13 0530  INR 1.62*   BMET  Recent Labs Lab 07/30/13 2335 07/31/13 0530 08/01/13 0259  NA 131* 133* 133*  K 4.1 3.8 4.2  CL 94* 100 104  CO2 24 24 19   BUN 20 18 14   CREATININE 0.92 0.78 0.70  GLUCOSE 103* 100* 89   Electrolytes  Recent Labs Lab 07/30/13 2335 07/31/13 0530 08/01/13 0259  CALCIUM 9.5 8.0* 7.2*   Sepsis Markers  Recent Labs Lab 07/30/13 2342  LATICACIDVEN 1.58   ABG No results found for this basename: PHART, PCO2ART, PO2ART,  in the last 168 hours Liver Enzymes  Recent Labs Lab 07/30/13 2335  AST 34  ALT 21  ALKPHOS 97  BILITOT 0.7  ALBUMIN 2.0*   Cardiac Enzymes No results found for this basename: TROPONINI, PROBNP,  in the last 168 hours Glucose  Recent Labs Lab 08/01/13 1143  GLUCAP 92    Imaging Ct Pelvis W Contrast  07/31/2013   CLINICAL DATA:  Right hip pain. Per EPIC notes, outside "CT scan of a right hip shows evidence of an infection around the hip and tracking into the pelvis along the iliopsoas muscle. The patient will require admission to the hospital, I indicated that he should go to the emergency room tonight or tomorrow morning and seek medical attention".  EXAM: CT PELVIS WITH CONTRAST  TECHNIQUE: Multidetector CT imaging of the pelvis was performed using the standard protocol following the bolus administration of intravenous contrast.  CONTRAST:  113mL OMNIPAQUE IOHEXOL 300 MG/ML  SOLN  COMPARISON:  Abdominal pelvic CT 05/07/2007 and 02/25/2003. Outside study is unavailable.  FINDINGS: There is  a large complex right hip joint effusion with fluid tracking into the iliopsoas bursa. There is a small amount of air anteriorly within the joint on image 85. Complex fluid within the iliopsoas bursa extends superiorly along the right iliacus muscle, measuring up to 8.9 x 5.2 cm transverse on image 48.  Since the prior CT, the patient has developed marked right hip joint space loss with multifocal destruction of the acetabulum consistent with osteomyelitis. The femoral head demonstrates mild rarefaction without gross cortical destruction or subchondral collapse. There is no dislocation. Apart from the acetabulum, there is no evidence of pelvic osteomyelitis. The sacroiliac joints and lower lumbar spine appear intact.  There is decubitus ulceration over the lower sacrum and coccyx with associated soft tissue emphysema. There is no gross destruction of the sacrum or coccyx. There appears to be some induration of the skin posterolateral to the right femoral greater trochanter.  Chronic bladder wall thickening appear stable.  IMPRESSION: 1. 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 There is probable early osteomyelitis of the femoral head. 2. Associated infected iliopsoas bursa with superior extension of a large abscess into the pelvis between the iliacus muscle and the right iliac bone. 3. Decubitus ulcer over the lower sacrum and coccyx.   Electronically Signed   By: Camie Patience M.D.   On: 07/31/2013 02:42     ASSESSMENT / PLAN:  PULMONARY A: Smoker P:   Oxygen as needed  CARDIOVASCULAR  A: Septic shock Transient sinus bradycardia P:  Check lactate, remains hypotensive-will need pressors Prefer use dopamine given relative bradycardia  RENAL A:  At risk AKI P:   Place Foley, monitor urine output  GASTROINTESTINAL A:  No issues P:   Regular diet  HEMATOLOGIC A:  Acute blood loss anemia Thrombocytopenia P:  Transfuse to  hemoglobin 7 & above Monitor platelets  INFECTIOUS A:  Septic arthritis Pelvic abscess Sacral decubitus HIV P:   BCx2 7/23 no growth UC 7/23 no growth Abscess 7/23 GPC pairs and chains>> Abx: Vancomycin/Zosyn, start date is 7/23 ID consult Ortho following hip drains Surgical input for debridement of sacral wound, once hemodynamics better  ENDOCRINE A:  No issues   P:   Stress dose steroids only if more than 1 pressors required  NEUROLOGIC A:  No issues P:   PT consult eventually  TODAY'S SUMMARY: Would repeat lactate after second unit of blood transfusion, if remains hypotensive or if lactate high, then we'll place central line and start pressors.  I have personally obtained a history, examined the patient, evaluated laboratory and imaging results, formulated the assessment and plan and placed orders. CRITICAL CARE: The patient is critically ill with multiple organ systems failure and requires high complexity decision making for assessment and support, frequent evaluation and titration of therapies, application of advanced monitoring technologies and extensive interpretation of multiple databases. Critical Care Time devoted to patient care services described in this note is 60 minutes.   [name] [name]  Pulmonary and [place] Pager: [phone]  08/01/2013, 12:21 PM

## 2013-08-01 NOTE — Progress Notes (Addendum)
This am pt's BP sys in 80s, HR in mid to high 60's, throughout morning pt has received blood and bolouses per order, pt with current hr 40-low 50s, with BP sys 70-80, CCM assessed pt with current plan to tx to ICU for closer monitoring, nursing will cont to monitor. Unable to place foley due to resistance, MD Elsworth Soho notified

## 2013-08-01 NOTE — Progress Notes (Signed)
Pts. BP was low SBP was in the low 80's and MAP in the 60's, pt. Stated that he feels weak and tired very limited to move due to pain in the right hip, but once repositioned feels better after. Dr. Marvel Plan on call was paged and with orders made. Will cont. to monitor pt.

## 2013-08-01 NOTE — Progress Notes (Signed)
Pt tx to ICU per MD order and pt stability, family at [facility] noted to be very upset this RN educated pt and family of tx, pt and family verbalized understanding, report called to receiving RN, all questions answered

## 2013-08-01 NOTE — Consult Note (Addendum)
Wound care follow-up: Pt went to the OR yesterday and is now followed by ortho for hip wound and pelvic area.   WOC consult requested for assessment and plan of care to necrotic sacral wound. Chart and CT scan results reviewed. CT scan indicates extensive tracking of fluid to pelvic area. The description is consistent with a full-thickness abscessed wound, which has contributed to an unstageable pressure ulcer.  Sacrum unstagable wound 6.5X5.5cm; 100% fluctuant slough.  Strong odor, mod amt tan drainage.  Plan: Santyl ointment for chemical debridement of nonviable tissue.  Recommend CCS consult to assess for possible bedside debridement of necrotic tissue.  If this is not a viable option, then pt could benefit from hydrotherapy to assist with debridement. Progress notes indicate that Dr Dalbert Batman is familiar with the patient prior to admission.   Air mattress replacement was ordered yesterday for pressure reduction. Julien Girt MSN, RN, [place], [place], [place]

## 2013-08-01 NOTE — Progress Notes (Signed)
CRITICAL VALUE ALERT  Critical value received:  Trop=0.31  Date of notification:  08/01/2013  Time of notification:  10:21 PM  Critical value read back:Yes.    Nurse who received alert:  Barrie Lyme, RN  MD notified (1st page):  Dr. Sherril Cong  Time of first page:  2221  MD notified (2nd page):  Time of second page:  Responding MD:  Dr. Sherril Cong  Time MD responded:  2221

## 2013-08-01 NOTE — Progress Notes (Signed)
Orthopedics Progress Note  Subjective: Pt feels better today  Objective:  Filed Vitals:   08/01/13 0919  BP: 75/46  Pulse: 63  Temp:   Resp: 18    General: Awake and alert  Musculoskeletal: right hip dressing intact, drains intact Neurovascularly intact  Lab Results  Component Value Date   WBC 8.8 08/01/2013   HGB 6.1* 08/01/2013   HCT 18.4* 08/01/2013   MCV 104.5* 08/01/2013   PLT 130* 08/01/2013       Component Value Date/Time   NA 133* 08/01/2013 0259   K 4.2 08/01/2013 0259   CL 104 08/01/2013 0259   CO2 19 08/01/2013 0259   GLUCOSE 89 08/01/2013 0259   BUN 14 08/01/2013 0259   CREATININE 0.70 08/01/2013 0259   CREATININE 1.10 03/28/2013 1130   CALCIUM 7.2* 08/01/2013 0259   GFRNONAA >90 08/01/2013 0259   GFRAA >90 08/01/2013 0259    Lab Results  Component Value Date   INR 1.62* 07/31/2013   INR 1.10* 02/07/2007   INR 1.20* 01/22/2007   PROTIME 13.2 02/07/2007   PROTIME 14.4* 01/22/2007   PROTIME 18.0* 01/15/2007    Assessment/Plan: POD #1 s/p Procedure(s): IRRIGATION AND DEBRIDEMENT HIP WITH PERCUTANEOUS DRAIN PLACEMENT Acute Blood Loss Anemia - transfusion in progress Patient clinically improved Gram Positive Cocci from gram stain...surgical wound culture pending.  Continue broad spectrum antibiotic coverage and closed suction drainage. Patient can mobilize as tolerated WBAT with PT or nursing Dr [name] aware of patient and will assume his ortho care as I am leaving town today.  Thanks  Doran Heater. [name], MD 08/01/2013 10:35 AM

## 2013-08-01 NOTE — Progress Notes (Signed)
  PROGRESS NOTE MEDICINE TEACHING ATTENDING   Day 2 of stay Patient name: [name] [name]   Medical record number: [id number] Date of birth: Mar 12, 1960    Post incision drainage day 1. Patient hypotensive despite resuscitation.  Filed Vitals:   08/01/13 1315 08/01/13 1330 08/01/13 1345 08/01/13 1400  BP: 73/46 75/44 78/51  75/49  Pulse: 53 58 57 54  Temp:      TempSrc:      Resp: 14 17 16 15   Height:      Weight:      SpO2: 100% 100% 100% 100%   Exam -  General: Resting in bed. No distress if he stays still, if he moves he is in pain.  HEENT: PERRL, EOMI, no scleral icterus. Heart: RRR, no rubs, murmurs or gallops. Lungs: Clear to auscultation bilaterally, no wheezes, rales, or rhonchi. Abdomen: Soft, nontender, nondistended, BS present. Extremities: tenderness right leg - ROM limited all sides.  Neuro: Alert and oriented X3.  Scheduled Medications . atazanavir  300 mg Oral Q breakfast  . collagenase   Topical Daily  . feeding supplement (ENSURE COMPLETE)  237 mL Oral BID BM  . lamiVUDine-zidovudine  1 tablet Oral BID  . nystatin  5 mL Oral QID  . piperacillin-tazobactam (ZOSYN)  IV  3.375 g Intravenous 3 times per day  . ritonavir  100 mg Oral Q breakfast  . sulfamethoxazole-trimethoprim  1 tablet Oral Daily  . tenofovir  300 mg Oral Daily  . vancomycin  500 mg Intravenous Q8H    PRN Medications acetaminophen, HYDROmorphone (DILAUDID) injection, HYDROmorphone (DILAUDID) injection, metoCLOPramide (REGLAN) injection, metoCLOPramide, ondansetron (ZOFRAN) IV, ondansetron  Assessment and Plan                                                                       Critically ill severe septic shock - PCCM has been involved. I reviewed their note, if the patient remains hypotensive, agree with starting pressors. We continue current antibiotic management. Bactrim added for low CD4 count.  We continue to follow ortho for further inputs on septic arthritis and osteo.  Agree with  blood transfusion.  I have discussed the care of this patient with my IM team residents. Please see the resident note for details.  [place], [place] 08/01/2013, 2:10 PM.

## 2013-08-01 NOTE — Progress Notes (Signed)
Dr. Ronnald Ramp called and updated about pts. BP after 1 liter bolus of NS, SBP was up to the  90's, was very uncomfortable and was given 1/2 dose of dilaudid IV and repositioned  pt. Will cont. to monitor VS.

## 2013-08-01 NOTE — Progress Notes (Addendum)
Lab. Called  Result of Hgb. this am was 6.5 was asked wether need to be recollected, RN told lab. will clarify with the MD. Dr. Marvel Plan was  Notified and wants to redraw blood and ordred for type and screen and transfuse PRBC. Will call lab. to follow up blood redraw of CBC and type and screen this am. Dr. Ronnald Ramp in and updated on patients urine output and BP still remains low MAP of 55 and SBP of 80's.

## 2013-08-01 NOTE — Procedures (Signed)
Central Venous Catheter Insertion Procedure Note [name] [name] [id number] 29-Dec-1960  Procedure: Insertion of Central Venous Catheter Indications: Assessment of intravascular volume  Procedure Details Consent: Risks of procedure as well as the alternatives and risks of each were explained to the (patient/caregiver).  Consent for procedure obtained. Time Out: Verified patient identification, verified procedure, site/side was marked, verified correct patient position, special equipment/implants available, medications/allergies/relevent history reviewed, required imaging and test results available.  Performed  Maximum sterile technique was used including antiseptics, cap, gloves, gown, hand hygiene, mask and sheet. Skin prep: Chlorhexidine; local anesthetic administered A antimicrobial bonded/coated triple lumen catheter was placed in the right subclavian vein using the Seldinger technique.  Evaluation Blood flow good Complications: No apparent complications Patient did tolerate procedure well. Chest X-ray ordered to verify placement.  CXR: pending.  [name] [name],PETE 08/01/2013, 2:53 PM

## 2013-08-01 NOTE — Op Note (Signed)
NAMEWALTON, DIGILIO                ACCOUNT NO.:  [id number]  MEDICAL RECORD NO.:  [id number]  LOCATION:  3S12C                        FACILITY:  [facility]  PHYSICIAN:  Doran Heater. [name], M.D. DATE OF BIRTH:  [birth year]  DATE OF PROCEDURE:  07/31/2013 DATE OF DISCHARGE:                              OPERATIVE REPORT   PREOPERATIVE DIAGNOSIS:  Septic arthritis, right hip and large intra and extra pelvic abscess.  POSTOPERATIVE DIAGNOSIS:  Septic arthritis, right hip and large intra and extra pelvic abscess.  PROCEDURE PERFORMED:  Right hip/pelvis, limited I and D and placement of close suction drainage intra and extra pelvic areas.  ATTENDING SURGEON:  Doran Heater. [name], MD  ASSISTANT:  Abbott Pao. Dixon, PA-C, who scrubbed the entire procedure and necessary for satisfactory completion of surgery.  ANESTHESIA:  General anesthesia was used.  ESTIMATED BLOOD LOSS:  Minimal.  FLUID REPLACED:  1000 mL crystalloid.  INSTRUMENT COUNTS:  Correct.  COMPLICATIONS:  There were no complications.  ANTIBIOTICS:  Perioperative antibiotics were given.  INDICATIONS:  Patient is a 53 year old male with a history of HIV who presented with a several month history of increasing right lower abdominal hip and groin pain.  The patient has been managed as an outpatient at this point, presented to the emergency department with increasing pain and was admitted for further evaluation.  CT scan indicating likely septic arthritis in the right hip with osteomyelitis of the both proximal femur and the acetabulum.  The patient also has a large abscess present about the hip and the groin and pelvic area. After consultation with the other medical teams caring for him, it was decided to perform essentially a percutaneous drain placement and decompression of his pelvic abscess, discussed this with the patient and his family members who agreed with this plan.  Informed consent was obtained.  DESCRIPTION OF  PROCEDURE:  After an adequate level of anesthesia achieved, the patient was positioned supine on the operating room table. The right hip and thigh and leg were draped in the usual sterile manner. Time-out called.  A 1-cm incision was created over the iliac crest directly over an area of what appeared to be some increased tension under the skin.  Once we went down through subcutaneous tissues with the needle-tip Bovie, we encountered purulent material.  Claiborne Billings clamp was used to bluntly dissect through into the inner table of the ilium and then also the outer table.  About 150 mL of purulent material was retrieved.  We used suction to remove that, then irrigated and then placed 2 drains both for 10 mm flat Jackson-Pratt drains, and they were placed just inside the ilium and then just outside of the ilium and were sutured in place.  We sutured the wound, closed with nylon suture.  Sterile compressive bandage was applied.  The patient was taken to the recovery room in stable condition.     Doran Heater. [name], M.D.     SRN/MEDQ  D:  08/01/2013  T:  08/01/2013  Job:  [id number]

## 2013-08-01 NOTE — Progress Notes (Signed)
S: was called to evaluate pt given continued hypotension during blood transfusion.   O: Temp: 94 F, HR 39, BP 79/30, RR 13, O2 100% on 2L   General: resting in bed, pale, diapohretic HEENT: PERRL, EOMI, no scleral icterus, dry crackled lips, xerostomia Cardiac: regular brady, no rubs, murmurs or gallops Pulm: clear to auscultation bilaterally, no crackles/wheezes/rhonchi, moving normal volumes of air Abd: soft, nontender, nondistended, BS present Ext: warm and well perfused, no pedal edema, foley with no UO Neuro: alert, waxing/waning consciousness, answers questions appropriately, cranial nerves II-XII grossly intact   A/P:Hypovolemic shock in setting of septic joint and recent debridement -called PCCM to evaluate potentially needs pressure support -2 U PRBCs ordered -4th 1L NS bolus ordered -foley insertion with temp probe -nursing staff updated of plan  Clinton Gallant, MD Pgr: [phone]

## 2013-08-01 NOTE — Progress Notes (Signed)
Subjective: Pt remained hypotensive overnight and was receiving NS bolus and 1 u PRBC post op. Hgb 6.1 on repeat this am. Pt denies any CP, SOB, DOE, or diarrhea. Throughout the morning pt has become anuric despite aggressive IVF and then some intermittent SB as low as 39.   Objective: Vital signs in last 24 hours: Filed Vitals:   08/01/13 1145 08/01/13 1200 08/01/13 1210 08/01/13 1215  BP: 86/56 76/49  82/54  Pulse: 63 66 62 55  Temp:    95.9 F (35.5 C)  TempSrc:    Axillary  Resp: 20 18 23 18   Height:      Weight:      SpO2: 100% 100% 100% 100%   Weight change:   Intake/Output Summary (Last 24 hours) at 08/01/13 1302 Last data filed at 08/01/13 1210  Gross per 24 hour  Intake 5078.5 ml  Output  697.5 ml  Net   4381 ml   BP 82/54  Pulse 55  Temp(Src) 95.9 F (35.5 C) (Axillary)  Resp 18  Ht 6' 1.5" (1.867 m)  Wt 123 lb 0.3 oz (55.8 kg)  BMI 16.01 kg/m2  SpO2 100% General: resting in bed, pale, diapohretic  HEENT: PERRL, EOMI, no scleral icterus, dry crackled lips, xerostomia  Cardiac: regular brady, no rubs, murmurs or gallops  Pulm: clear to auscultation bilaterally, no crackles/wheezes/rhonchi, moving normal volumes of air  Abd: soft, nontender, nondistended, BS present  Ext: warm and well perfused, no pedal edema, foley with no UO  Neuro: alert, waxing/waning consciousness, answers questions appropriately, cranial nerves II-XII grossly intact   Lab Results: Basic Metabolic Panel:  Recent Labs Lab 07/31/13 0530 08/01/13 0259  NA 133* 133*  K 3.8 4.2  CL 100 104  CO2 24 19  GLUCOSE 100* 89  BUN 18 14  CREATININE 0.78 0.70  CALCIUM 8.0* 7.2*   Liver Function Tests:  Recent Labs Lab 07/30/13 2335  AST 34  ALT 21  ALKPHOS 97  BILITOT 0.7  PROT 8.3  ALBUMIN 2.0*    CBC:  Recent Labs Lab 07/31/13 0025 07/31/13 0530 08/01/13 0916  WBC 9.4 8.4 8.8  NEUTROABS 7.7  --   --   HGB 8.5* 8.6* 6.1*  HCT 25.8* 26.3* 18.4*  MCV 102.8* 105.2*  104.5*  PLT 204 196 130*   CBG:  Recent Labs Lab 08/01/13 1143  GLUCAP 92   Coagulation:  Recent Labs Lab 07/31/13 0530  LABPROT 19.2*  INR 1.62*   Anemia Panel:  Recent Labs Lab 07/31/13 0610  VITAMINB12 247  FOLATE 5.5   Urinalysis:  Recent Labs Lab 07/31/13 0628  COLORURINE ORANGE*  LABSPEC >1.046*  PHURINE 8.5*  GLUCOSEU NEGATIVE  HGBUR LARGE*  BILIRUBINUR SMALL*  KETONESUR NEGATIVE  PROTEINUR >300*  UROBILINOGEN 2.0*  NITRITE POSITIVE*  LEUKOCYTESUR SMALL*   Misc. Labs: Vit b12 247 Folate 5.5 CD4 140, 20% helper T-cell HIV1 RNA quant pending  Micro Results: Recent Results (from the past 240 hour(s))  CULTURE, BLOOD (ROUTINE X 2)     Status: None   Collection Time    07/30/13 11:35 PM      Result Value Ref Range Status   Specimen Description BLOOD RIGHT ARM   Final   Special Requests BOTTLES DRAWN AEROBIC AND ANAEROBIC 5CC   Final   Culture  Setup Time     Final   Value: 07/31/2013 08:53     Performed at [place]     Final  Value:        BLOOD CULTURE RECEIVED NO GROWTH TO DATE CULTURE WILL BE HELD FOR 5 DAYS BEFORE ISSUING A FINAL NEGATIVE REPORT     Performed at [facility]   Report Status PENDING   Incomplete  CULTURE, BLOOD (ROUTINE X 2)     Status: None   Collection Time    07/31/13 12:25 AM      Result Value Ref Range Status   Specimen Description BLOOD LEFT ARM   Final   Special Requests BOTTLES DRAWN AEROBIC AND ANAEROBIC [facility]   Final   Culture  Setup Time     Final   Value: 07/31/2013 08:53     Performed at [facility]   Culture     Final   Value:        BLOOD CULTURE RECEIVED NO GROWTH TO DATE CULTURE WILL BE HELD FOR 5 DAYS BEFORE ISSUING A FINAL NEGATIVE REPORT     Performed at [facility]   Report Status PENDING   Incomplete  MRSA PCR SCREENING     Status: None   Collection Time    07/31/13  3:47 AM      Result Value Ref Range Status   MRSA by PCR NEGATIVE  NEGATIVE Final    Comment:            The GeneXpert MRSA Assay (FDA     approved for NASAL specimens     only), is one component of a     comprehensive MRSA colonization     surveillance program. It is not     intended to diagnose MRSA     infection nor to guide or     monitor treatment for     MRSA infections.  URINE CULTURE     Status: None   Collection Time    07/31/13  6:29 AM      Result Value Ref Range Status   Specimen Description URINE, RANDOM   Final   Special Requests Immunocompromised   Final   Culture  Setup Time     Final   Value: 07/31/2013 13:32     Performed at [facility]     Final   Value: NO GROWTH     Performed at [facility]   Culture     Final   Value: NO GROWTH     Performed at [facility]   Report Status 08/01/2013 FINAL   Final  CULTURE, ROUTINE-ABSCESS     Status: None   Collection Time    07/31/13  1:36 PM      Result Value Ref Range Status   Specimen Description ABSCESS RIGHT HIP   Final   Special Requests POF ZOSYN AND VANCOMYCIN   Final   Gram Stain     Final   Value: ABUNDANT WBC PRESENT,BOTH PMN AND MONONUCLEAR     NO SQUAMOUS EPITHELIAL CELLS SEEN     MODERATE GRAM POSITIVE COCCI     IN PAIRS IN CHAINS     Performed at [facility]   Culture     Final   Value: Culture reincubated for better growth     Performed at [facility]   Report Status PENDING   Incomplete  ANAEROBIC CULTURE     Status: None   Collection Time    07/31/13  1:36 PM      Result Value Ref Range Status   Specimen Description  ABSCESS RIGHT HIP   Final   Special Requests POF ZOSYN AND VANCOMYCIN   Final   Gram Stain     Final   Value: ABUNDANT WBC PRESENT,BOTH PMN AND MONONUCLEAR     NO SQUAMOUS EPITHELIAL CELLS SEEN     MODERATE GRAM POSITIVE COCCI     IN PAIRS IN CHAINS     Performed at [facility]   Culture     Final   Value: NO ANAEROBES ISOLATED; CULTURE IN PROGRESS FOR 5 DAYS     Performed at [facility]   Report Status PENDING   Incomplete   Studies/Results: Ct Pelvis W Contrast  07/31/2013   CLINICAL DATA:  Right hip pain. Per EPIC notes, outside "CT scan of a right hip shows evidence of an infection around the hip and tracking into the pelvis along the iliopsoas muscle. The patient will require admission to the hospital, I indicated that he should go to the emergency room tonight or tomorrow morning and seek medical attention".  EXAM: CT PELVIS WITH CONTRAST  TECHNIQUE: Multidetector CT imaging of the pelvis was performed using the standard protocol following the bolus administration of intravenous contrast.  CONTRAST:  172mL OMNIPAQUE IOHEXOL 300 MG/ML  SOLN  COMPARISON:  Abdominal pelvic CT 05/07/2007 and 02/25/2003. Outside study is unavailable.  FINDINGS: There is a large complex right hip joint effusion with fluid tracking into the iliopsoas bursa. There is a small amount of air anteriorly within the joint on image 85. Complex fluid within the iliopsoas bursa extends superiorly along the right iliacus muscle, measuring up to 8.9 x 5.2 cm transverse on image 48.  Since the prior CT, the patient has developed marked right hip joint space loss with multifocal destruction of the acetabulum consistent with osteomyelitis. The femoral head demonstrates mild rarefaction without gross cortical destruction or subchondral collapse. There is no dislocation. Apart from the acetabulum, there is no evidence of pelvic osteomyelitis. The sacroiliac joints and lower lumbar spine appear intact.  There is decubitus ulceration over the lower sacrum and coccyx with associated soft tissue emphysema. There is no gross destruction of the sacrum or coccyx. There appears to be some induration of the skin posterolateral to the right femoral greater trochanter.  Chronic bladder wall thickening appear stable.  IMPRESSION: 1. 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  There is probable early osteomyelitis of the femoral head. 2. Associated infected iliopsoas bursa with superior extension of a large abscess into the pelvis between the iliacus muscle and the right iliac bone. 3. Decubitus ulcer over the lower sacrum and coccyx.   Electronically Signed   By: Camie Patience M.D.   On: 07/31/2013 02:42   Dg Chest Port 1 View  08/01/2013   CLINICAL DATA:  Postop  EXAM: PORTABLE CHEST - 1 VIEW  COMPARISON:  12/24/2012  FINDINGS: Heart, mediastinum and hila are unremarkable. Lungs are hyperexpanded with mild interstitial prominence and evidence of apical emphysema. No lung consolidation or edema. No pleural effusion or pneumothorax.  Bony thorax is intact.  IMPRESSION: No acute cardiopulmonary disease.   Electronically Signed   By: Lajean Manes M.D.   On: 08/01/2013 09:10   Medications: I have reviewed the patient's current medications. Scheduled Meds: . atazanavir  300 mg Oral Q breakfast  . collagenase   Topical Daily  . feeding supplement (ENSURE COMPLETE)  237 mL Oral BID BM  . lamiVUDine-zidovudine  1 tablet Oral BID  .  nystatin  5 mL Oral QID  . piperacillin-tazobactam (ZOSYN)  IV  3.375 g Intravenous 3 times per day  . ritonavir  100 mg Oral Q breakfast  . sulfamethoxazole-trimethoprim  1 tablet Oral Daily  . tenofovir  300 mg Oral Daily  . vancomycin  500 mg Intravenous Q8H   Continuous Infusions: . sodium chloride 125 mL/hr at 08/01/13 1000   PRN Meds:.acetaminophen, HYDROmorphone (DILAUDID) injection, HYDROmorphone (DILAUDID) injection, metoCLOPramide (REGLAN) injection, metoCLOPramide, ondansetron (ZOFRAN) IV, ondansetron Assessment/Plan:  # Septic Arthritis of Right Hip in setting of stage 4 decubitus ulcer: Patient has septic arthritis of R hip in the setting of a stage 4 sacral decubitus ulcer. He has a fever of 101.3, was tachycardic to 106, and tachypnic to 22 without leukocytosis. His R leg and hip are painful to manipulation. 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 He was placed on vancomycin and ceftriaxone. After speaking this morning with [name], his ID physician, ceftriaxone was changed to zosyn per pharmacy. Etiology may be hematogenous spread of bacteria from the decubitus ulcer. I spoke with his general surgeon Dr. Dalbert Batman to inform him and he referred me to Dr. Ralene Ok who will evaluate the patient. The patient was also seen by Orthopedics (Dr. [name]) who will consult Dr. Helane Gunther (pelvic specialist) and Dr. Paralee Cancel (hip specialist) for their advice concerning surgical decompression versus debridement or possibly interventional radiology percutaneous drain placement. -appreciate orthopedics, general surgery, ID, and wound team -pt with some post-op anemia will trasfuse to goal of Hgb >7.0 -Continue vancomycin and zosyn per pharmacy -f/u BCx x 2, UCx -d/c dilaudid and morphine in setting of severe hypotension -regular diet -NS bolus may need pressor support soon - PCCM consulted -surgical debridement maybe needed for Stage IV sacral ulcer  Macrocytic Anemia: Hgb 8.4>>6.1 this morning, MCV 105. Hgb baseline 12.0. Admits to weakness and dizziness. Loss of appetite. Macrocytic anemia with normal vitamin b12 and folate mostly due to anti-retroviral therapy and anemia of chronic disease.  -cont to monitor -PRBCs until Hgb >7.0  HIV: Pt is followed by [name], ID, who was informed of hospitalization. Patient CD4 on admission is 140 with viral load pending . Viral load <200 requires PCP prophylaxis even in setting of vanc/zosyn. On atazanavir, combivir, norvir, and tenofovir at home. He admits he has missed a few doses recently.    -f/u HIV 1 RNA viral load  -Continue Atazanavir 300 mg daily  -Continue Lamivudine-Zidovudine 150-300 mg daily  -Continue Norvir 100 mg daily  -Continue Tenofovir 300 mg daily  -bactrim 1 double strength tab daily -ID consulted for above  History of Squamous Cell Cancer of the Anus in 2008, s/p radiation and chemotherapy: Follows with Dr. Dalbert Batman of [facility]. CT in February 2015 showed no recurrence of cancer, infection, adenopathy or hernia. CT showed post-radiation changes. Dr. Dalbert Batman informed of hospitalization.   History of DVT: LLE DVT in August of 2008. Complication of radiation and chemotherapy. No lower extremity edema, no calf tenderness. No longer on coumadin at home. PT 19.2 INR 1.62 -SCD's   Dispo: Disposition is deferred at this time, awaiting improvement of current medical problems.  Anticipated discharge in approximately 3 day(s).   The patient does have  a current PCP [name], MD) and does need an [facility] hospital follow-up appointment after discharge.  The patient does not know have transportation limitations that hinder transportation to clinic appointments.  .Services Needed at time of discharge: Y = Yes, Blank = No PT:   OT:   RN:   Equipment:   Other:     LOS: 2 days   Clinton Gallant, MD 08/01/2013, 1:02 PM

## 2013-08-02 DIAGNOSIS — L89109 Pressure ulcer of unspecified part of back, unspecified stage: Secondary | ICD-10-CM

## 2013-08-02 DIAGNOSIS — Z21 Asymptomatic human immunodeficiency virus [HIV] infection status: Secondary | ICD-10-CM

## 2013-08-02 DIAGNOSIS — R799 Abnormal finding of blood chemistry, unspecified: Secondary | ICD-10-CM

## 2013-08-02 DIAGNOSIS — M869 Osteomyelitis, unspecified: Secondary | ICD-10-CM

## 2013-08-02 DIAGNOSIS — K6812 Psoas muscle abscess: Secondary | ICD-10-CM

## 2013-08-02 DIAGNOSIS — A419 Sepsis, unspecified organism: Secondary | ICD-10-CM

## 2013-08-02 LAB — CBC
HCT: 29 % — ABNORMAL LOW (ref 39.0–52.0)
Hemoglobin: 9.7 g/dL — ABNORMAL LOW (ref 13.0–17.0)
MCH: 31.7 pg (ref 26.0–34.0)
MCHC: 33.4 g/dL (ref 30.0–36.0)
MCV: 94.8 fL (ref 78.0–100.0)
Platelets: 149 10*3/uL — ABNORMAL LOW (ref 150–400)
RBC: 3.06 MIL/uL — AB (ref 4.22–5.81)
RDW: 18.2 % — ABNORMAL HIGH (ref 11.5–15.5)
WBC: 13.4 10*3/uL — AB (ref 4.0–10.5)

## 2013-08-02 LAB — TROPONIN I: Troponin I: <0.3 ng/mL (ref <0.30)

## 2013-08-02 LAB — BASIC METABOLIC PANEL
Anion gap: 11 (ref 5–15)
BUN: 13 mg/dL (ref 6–23)
CO2: 19 meq/L (ref 19–32)
Calcium: 7.1 mg/dL — ABNORMAL LOW (ref 8.4–10.5)
Chloride: 108 mEq/L (ref 96–112)
Creatinine, Ser: 0.57 mg/dL (ref 0.50–1.35)
GFR calc Af Amer: >90 mL/min (ref >90)
GFR calc non Af Amer: >90 mL/min (ref >90)
Glucose, Bld: 90 mg/dL (ref 70–99)
Potassium: 3.3 mEq/L — ABNORMAL LOW (ref 3.7–5.3)
SODIUM: 138 meq/L (ref 137–147)

## 2013-08-02 LAB — LACTIC ACID, PLASMA: LACTIC ACID, VENOUS: 1 mmol/L (ref 0.5–2.2)

## 2013-08-02 LAB — GLUCOSE, CAPILLARY: Glucose-Capillary: 110 mg/dL — ABNORMAL HIGH (ref 70–99)

## 2013-08-02 LAB — VANCOMYCIN, TROUGH: Vancomycin Tr: 11.1 ug/mL (ref 10.0–20.0)

## 2013-08-02 LAB — CORTISOL: Cortisol, Plasma: 21.8 ug/dL

## 2013-08-02 MED ORDER — CHLORHEXIDINE GLUCONATE 0.12 % MT SOLN
15.0000 mL | Freq: Two times a day (BID) | OROMUCOSAL | Status: DC
  Administered 2013-08-02 – 2013-08-06 (×9): 15 mL via OROMUCOSAL
  Filled 2013-08-02 (×8): qty 15

## 2013-08-02 MED ORDER — BIOTENE DRY MOUTH MT LIQD
15.0000 mL | Freq: Two times a day (BID) | OROMUCOSAL | Status: DC
  Administered 2013-08-02 – 2013-08-06 (×9): 15 mL via OROMUCOSAL

## 2013-08-02 MED ORDER — VANCOMYCIN HCL 500 MG IV SOLR
500.0000 mg | Freq: Three times a day (TID) | INTRAVENOUS | Status: DC
  Administered 2013-08-03 – 2013-08-04 (×4): 500 mg via INTRAVENOUS
  Filled 2013-08-02 (×6): qty 500

## 2013-08-02 MED ORDER — POTASSIUM CHLORIDE 10 MEQ/100ML IV SOLN
10.0000 meq | INTRAVENOUS | Status: AC
  Administered 2013-08-02 – 2013-08-03 (×5): 10 meq via INTRAVENOUS
  Filled 2013-08-02 (×5): qty 100

## 2013-08-02 NOTE — Progress Notes (Signed)
PULMONARY / CRITICAL CARE MEDICINE   Name: [name] [name] MRN: [id number] DOB: [birth year]    ADMISSION DATE:  07/30/2013 CONSULTATION DATE:  08/02/2013   REFERRING MD :  Kristine Garbe   CHIEF COMPLAINT:  Low BP  INITIAL PRESENTATION: 53 year old, HIV positive admitted 7/22 with septic arthritis of right hip and abscess extending into the pelvis, underwent I&D and drainage on 7/23 followed by septic shock, hypotensive in spite of full resuscitation. 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 There is probable early osteomyelitis of the femoral head.  Associated infected iliopsoas bursa with superior extension of a  large abscess into the pelvis between the iliacus muscle and the right iliac bone.Decubitus ulcer over the lower sacrum and coccyx.   SIGNIFICANT EVENTS: 7/23 >> Right hip/pelvis, limited I and D and placement of close suction drainage intra and extra pelvic areas [name])   SUBJECTIVE:  C/o R hip, sacral pain.  Dopamine weaning.   VITAL SIGNS: Temp:  [94.4 F (34.7 C)-97.9 F (36.6 C)] 97.3 F (36.3 C) (07/25 1144) Pulse Rate:  [37-126] 48 (07/25 0800) Resp:  [11-34] 24 (07/25 0800) BP: (73-156)/(44-87) 100/63 mmHg (07/25 0800) SpO2:  [96 %-100 %] 100 % (07/25 0800) Weight:  [142 lb 6.7 oz (64.6 kg)] 142 lb 6.7 oz (64.6 kg) (07/24 1400)  HEMODYNAMICS: CVP:  [0 mmHg-3 mmHg] 3 mmHg  VENTILATOR SETTINGS:   INTAKE / OUTPUT:  Intake/Output Summary (Last 24 hours) at 08/02/13 1208 Last data filed at 08/02/13 0900  Gross per 24 hour  Intake 4614.75 ml  Output   4465 ml  Net 149.75 ml    PHYSICAL EXAMINATION: Gen. Pleasant, chronically ill-appearing, in no distress, depressed affect ENT - no lesions, no post nasal drip, pallor + Neck: No JVD, no thyromegaly, no carotid bruits Lungs: resps even non labored on [place], clear  Cardiovascular: Rhythm regular,  heart sounds  normal, no murmurs, no peripheral edema Abdomen: soft and non-tender, midline scar ,no hepatosplenomegaly, BS normal. Musculoskeletal: No deformities, no cyanosis or clubbing, R hip/groind dressing c/d with mult JP drains  Neuro:  alert, non focal Skin:  Warm, no lesions/ rash, good cap refill, sacral decub -unstagable (at least4) 6cm x 5cm   LABS:  CBC  Recent Labs Lab 08/01/13 0916 08/01/13 1505 08/02/13 0635  WBC 8.8 12.2* 13.4*  HGB 6.1* 9.0* 9.7*  HCT 18.4* 27.2* 29.0*  PLT 130* 145* 149*   Coag's  Recent Labs Lab 07/31/13 0530  INR 1.62*   BMET  Recent Labs Lab 08/01/13 0259 08/01/13 1505 08/02/13 0635  NA 133* 140 138  K 4.2 3.7 3.3*  CL 104 108 108  CO2 19 19 19   BUN 14 18 13   CREATININE 0.70 0.69 0.57  GLUCOSE 89 100* 90   Electrolytes  Recent Labs Lab 08/01/13 0259 08/01/13 1505 08/02/13 0635  CALCIUM 7.2* 6.9* 7.1*   Sepsis Markers  Recent Labs Lab 07/30/13 2342 08/01/13 1505  LATICACIDVEN 1.58 0.8   ABG  Recent Labs Lab 08/01/13 1641  PHART 7.305*  PCO2ART 33.9*  PO2ART 76.0*   Liver Enzymes  Recent Labs Lab 07/30/13 2335  AST 34  ALT 21  ALKPHOS 97  BILITOT 0.7  ALBUMIN 2.0*   Cardiac Enzymes  Recent Labs Lab 08/01/13 1730 08/01/13 2120  TROPONINI <0.30 0.31*   Glucose  Recent Labs Lab 08/01/13 1143 08/01/13 1445 08/01/13 2001  GLUCAP 92 107*  111*    Imaging Dg Chest Port 1 View  08/01/2013   CLINICAL DATA:  Central venous line placement.  EXAM: PORTABLE CHEST - 1 VIEW  COMPARISON:  08/01/2013.  FINDINGS: Central line noted with its tip projected over the cavoatrial junction. Mediastinum and hilar structures are normal.Borderline cardiomegaly, slight prominence of pulmonary vascularity. Very subtle Kerley B-lines are noted. Very mild congestive heart failure cannot be excluded. No pleural effusion or pneumothorax. No acute bony abnormality.  IMPRESSION: 1. Central line in good anatomic  position. 2. Can't exclude mild congestive heart failure.   Electronically Signed   By: Marcello Moores  Register   On: 08/01/2013 15:16   Dg Chest Port 1 View  08/01/2013   CLINICAL DATA:  Postop  EXAM: PORTABLE CHEST - 1 VIEW  COMPARISON:  12/24/2012  FINDINGS: Heart, mediastinum and hila are unremarkable. Lungs are hyperexpanded with mild interstitial prominence and evidence of apical emphysema. No lung consolidation or edema. No pleural effusion or pneumothorax.  Bony thorax is intact.  IMPRESSION: No acute cardiopulmonary disease.   Electronically Signed   By: Lajean Manes M.D.   On: 08/01/2013 09:10     ASSESSMENT / PLAN:  PULMONARY A: Smoker P:   Smoking cessation   CARDIOVASCULAR  A: Septic shock Transient sinus bradycardia Elevated troponin P:  Trend lactate  Gentle volume (s/p aggressive volume resuscitation)  Prefer use dopamine given relative bradycardia Cycle enzymes  12 leak EKG  RENAL A:  No active issue  P:   Place Foley, monitor urine output F/u chem - at risk AKI with hypotension   GASTROINTESTINAL A:  No issues P:   Regular diet  HEMATOLOGIC A:  Acute blood loss anemia Thrombocytopenia P:  Transfuse to for hgb <7 Monitor platelets  INFECTIOUS A:  Septic arthritis - iliopsoas abscess  Osteomyelitis R hip Pelvic abscess Sacral decubitus HIV P:   BCx2 7/23 no growth UC 7/23 no growth Abscess 7/23 GPC pairs and chains>>  Abx: Vancomycin/ imipenem, start date is 7/23 ID consult - [place] to see  Ortho following hip drains Surgical input for debridement of sacral wound, once hemodynamics better  ENDOCRINE A:  No issues   P:   F/u glucose on chem   NEUROLOGIC A:  No issues P:   PRN pain rx  PT/OT    TODAY'S SUMMARY:  F/u lactate, cycle enzymes, cont dopamine and titrate as able.  ID to see.  Abx as above.  Pt, wife updated at bedside at length.   I have personally obtained a history, examined the patient, evaluated laboratory and imaging  results, formulated the assessment and plan and placed orders.     Nickolas Madrid, NP 08/02/2013  12:08 PM Pager: [phone] or [phone]  *Care during the described time interval was provided by me and/or other providers on the critical care team. I have reviewed this patient's available data, including medical history, events of note, physical examination and test results as part of my evaluation.  Merton Border, MD ; [facility] [phone].  After 5:30 PM or weekends, call [phone]

## 2013-08-02 NOTE — Progress Notes (Signed)
Subjective: 2 Days Post-Op Procedure(s) (LRB): IRRIGATION AND DEBRIDEMENT HIP WITH PERCUTANEOUS DRAIN PLACEMENT (Right) Patient reports pain as moderate.  Pain increased with motion, trying to reposition himself in bed. No numbness or tingling. No other c/o this AM. Transferred to ICU yesterday. Received transfusion yesterday. Sacral decubitus ulcer- seen by wound care. Recommended CCS consult for possible bedside debridement  Objective: Vital signs in last 24 hours: Temp:  [94.4 F (34.7 C)-98.8 F (37.1 C)] 97.9 F (36.6 C) (07/25 0741) Pulse Rate:  [37-126] 48 (07/25 0800) Resp:  [11-34] 24 (07/25 0800) BP: (73-156)/(44-87) 100/63 mmHg (07/25 0800) SpO2:  [96 %-100 %] 100 % (07/25 0800) Weight:  [64.6 kg (142 lb 6.7 oz)] 64.6 kg (142 lb 6.7 oz) (07/24 1400)  Intake/Output from previous day: 07/24 0701 - 07/25 0700 In: 5231.9 [P.O.:60; I.V.:4186.4; Blood:735.5; IV Piggyback:250] Out: 4487.5 [Urine:4450; Drains:37.5] Intake/Output this shift: Total I/O In: 258.4 [I.V.:258.4] Out: -    Recent Labs  07/31/13 0025 07/31/13 0530 08/01/13 0916 08/01/13 1505 08/02/13 0635  HGB 8.5* 8.6* 6.1* 9.0* 9.7*    Recent Labs  08/01/13 1505 08/02/13 0635  WBC 12.2* 13.4*  RBC 2.77* 3.06*  HCT 27.2* 29.0*  PLT 145* 149*    Recent Labs  08/01/13 1505 08/02/13 0635  NA 140 138  K 3.7 3.3*  CL 108 108  CO2 19 19  BUN 18 13  CREATININE 0.69 0.57  GLUCOSE 100* 90  CALCIUM 6.9* 7.1*    Recent Labs  07/31/13 0530  INR 1.62*    Neurologically intact ABD soft Neurovascular intact Sensation intact distally Intact pulses distally Dorsiflexion/Plantar flexion intact Incision: dressing C/D/I and no drainage No cellulitis present Compartment soft drain in place  Assessment/Plan: 2 Days Post-Op Procedure(s) (LRB): IRRIGATION AND DEBRIDEMENT HIP WITH PERCUTANEOUS DRAIN PLACEMENT (Right) Advance diet Up with therapy Continue drains, continue IV abx per primary  service ID consult placed, pt is known to [name] for his HIV hx Dr. [name] to take over his ortho care per Dr. Garlan Fillers, [place] Rolls. 08/02/2013, 9:47 AM

## 2013-08-02 NOTE — Consult Note (Addendum)
[name] for Infectious Disease  Date of Admission:  07/30/2013  Date of Consult:  08/02/2013  Reason for Consult: sepsis Referring Physician: alva  Impression/Recommendation Sepsis Decubitus ulcer Osteomyelitis of R hip Iliopsoas Abscess AIDS  Would continue his current anbx Await Cx from OR Check HIV genotype Continue his ART  Comment- My great appreciation to IMTS, CCM and surgery for caring for this pt.  He states his wound has been present for a few weeks but he is unclear exactly. I did not undress his decubitus at this point.   Thank you so much for this interesting consult,   [name] (pager) [phone] [email or web address]  [name] [name] is an 53 y.o. male.  HPI: 53 yo with HIV+, previous squamous cell of anus 2008, He was being evaluated for hip pain and was unable to tolerate MRI (due to inability to straighten his leg). He had a CT 7-23: 1. 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 There is probable early osteomyelitis of the femoral head. 2. Associated infected iliopsoas bursa with superior extension of a large abscess into the pelvis between the iliacus muscle and the right iliac bone. 3. Decubitus ulcer over the lower sacrum and coccyx.  I was called by the ED at this point and told he had an infected wrist.  He denied f/c on adm. He was afeb, his WBC was normal. Per family he had temp 101+.  He as started on ceftriaxone and vanco. I discussed his case with house staff and we changed his ceftriaxone to zosyn.  His repeat CD4 was 140. He was restarted on his ART and had bactrim added.  He on 7-23 he had I & D of his abscess and hip. His course was further complicated by anemia and hypotension by 7-24. Despite fluid and blood being given, he required dopamine and transfer to ICU on 7-24.    Past Medical History  Diagnosis Date  . Radiation   . HIV (human immunodeficiency virus  infection)   . Shingles     left shoulder, right leg  . Pain in limb 07/23/2013  . Rectal cancer     Squamous cell  . Gout     Past Surgical History  Procedure Laterality Date  . Hernia repair      right  . Hematoma evacuation    . Incision and drainage hip Right 07/31/2013    Procedure: IRRIGATION AND DEBRIDEMENT HIP WITH PERCUTANEOUS DRAIN PLACEMENT;  Surgeon: [name], MD;  Location: [facility];  Service: Orthopedics;  Laterality: Right;     No Known Allergies  Medications:  Scheduled: . antiseptic oral rinse  15 mL Mouth Rinse q12n4p  . atazanavir  300 mg Oral Q breakfast  . chlorhexidine  15 mL Mouth Rinse BID  . collagenase   Topical Daily  . feeding supplement (ENSURE COMPLETE)  237 mL Oral BID BM  . lamiVUDine-zidovudine  1 tablet Oral BID  . nystatin  5 mL Oral QID  . piperacillin-tazobactam (ZOSYN)  IV  3.375 g Intravenous 3 times per day  . ritonavir  100 mg Oral Q breakfast  . sulfamethoxazole-trimethoprim  1 tablet Oral Daily  . tenofovir  300 mg Oral Daily  . vancomycin  500 mg Intravenous Q8H    Abtx:  Anti-infectives   Start     Dose/Rate Route Frequency Ordered Stop   08/01/13 1100  vancomycin (VANCOCIN) 500 mg in sodium chloride 0.9 % 100  mL IVPB     500 mg 100 mL/hr over 60 Minutes Intravenous Every 8 hours 08/01/13 1057     08/01/13 1000  lamiVUDine-zidovudine (COMBIVIR) 150-300 MG per tablet 1 tablet     1 tablet Oral 2 times daily 07/31/13 0346     07/31/13 1615  sulfamethoxazole-trimethoprim (BACTRIM DS) 800-160 MG per tablet 1 tablet     1 tablet Oral Daily 07/31/13 1402     07/31/13 1415  sulfamethoxazole-trimethoprim (BACTRIM DS) 800-160 MG per tablet 1 tablet  Status:  Discontinued     1 tablet Oral Every 12 hours 07/31/13 1400 07/31/13 1402   07/31/13 1400  piperacillin-tazobactam (ZOSYN) IVPB 3.375 g     3.375 g 12.5 mL/hr over 240 Minutes Intravenous 3 times per day 07/31/13 1208     07/31/13 1000  tenofovir (VIREAD) tablet 300 mg      300 mg Oral Daily 07/31/13 0346     07/31/13 1000  cefTRIAXone (ROCEPHIN) 1 g in dextrose 5 % 50 mL IVPB  Status:  Discontinued     1 g 100 mL/hr over 30 Minutes Intravenous Every 12 hours 07/31/13 0331 07/31/13 1208   07/31/13 0800  vancomycin (VANCOCIN) 500 mg in sodium chloride 0.9 % 100 mL IVPB  Status:  Discontinued     500 mg 100 mL/hr over 60 Minutes Intravenous Every 8 hours 07/31/13 0331 07/31/13 1543   07/31/13 0700  atazanavir (REYATAZ) capsule 300 mg     300 mg Oral Daily with breakfast 07/31/13 0346     07/31/13 0700  ritonavir (NORVIR) tablet 100 mg     100 mg Oral Daily with breakfast 07/31/13 0346     07/31/13 0030  cefTRIAXone (ROCEPHIN) 2 g in dextrose 5 % 50 mL IVPB     2 g 100 mL/hr over 30 Minutes Intravenous  Once 07/31/13 0019 07/31/13 0115   07/31/13 0000  vancomycin (VANCOCIN) IVPB 1000 mg/200 mL premix     1,000 mg 200 mL/hr over 60 Minutes Intravenous  Once 07/30/13 2345 07/31/13 0302      Total days of antibiotics: 3 vanco/zosyn          Social History:  reports that he has been smoking Cigarettes.  He has been smoking about 0.30 packs per day. He has never used smokeless tobacco. He reports that he drinks about 1.5 ounces of alcohol per week. He reports that he uses illicit drugs (Marijuana).  Family History  Problem Relation Age of Onset  . Diabetes Mother   . Dementia Mother   . Atrial fibrillation Mother   . Hypertension Mother   . Cancer Father   . Emphysema Father     General ROS: no BM, polyuria, eating well, pain better, no paresthesias. see HPI.   Blood pressure 100/63, pulse 48, temperature 97.9 F (36.6 C), temperature source Oral, resp. rate 24, height 6' 1" (1.854 m), weight 64.6 kg (142 lb 6.7 oz), SpO2 100.00%. General appearance: alert, cooperative, no distress and pale Eyes: negative findings: conjunctivae and sclerae normal and pupils equal, round, reactive to light and accomodation Throat: normal findings: oropharynx pink & moist  without lesions or evidence of thrush and abnormal findings: dentition: poor Neck: no adenopathy and supple, symmetrical, trachea midline Lungs: clear to auscultation bilaterally Chest wall: R chest catheter Heart: regular rate and rhythm Abdomen: normal findings: bowel sounds normal and soft, non-tender and RLQ drains, bloody fluid.  Extremities: edema none Neurologic: Sensory: grossly normal light touch LE.    Results for  orders placed during the hospital encounter of 07/30/13 (from the past 48 hour(s))  CULTURE, ROUTINE-ABSCESS     Status: None   Collection Time    07/31/13  1:36 PM      Result Value Ref Range   Specimen Description ABSCESS RIGHT HIP     Special Requests POF ZOSYN AND VANCOMYCIN     Gram Stain       Value: ABUNDANT WBC PRESENT,BOTH PMN AND MONONUCLEAR     NO SQUAMOUS EPITHELIAL CELLS SEEN     MODERATE GRAM POSITIVE COCCI     IN PAIRS IN CHAINS     Performed at [facility]   Culture       Value: Culture reincubated for better growth     Performed at [facility]   Report Status PENDING    ANAEROBIC CULTURE     Status: None   Collection Time    07/31/13  1:36 PM      Result Value Ref Range   Specimen Description ABSCESS RIGHT HIP     Special Requests POF ZOSYN AND VANCOMYCIN     Gram Stain       Value: ABUNDANT WBC PRESENT,BOTH PMN AND MONONUCLEAR     NO SQUAMOUS EPITHELIAL CELLS SEEN     MODERATE GRAM POSITIVE COCCI     IN PAIRS IN CHAINS     Performed at [facility]   Culture       Value: NO ANAEROBES ISOLATED; CULTURE IN PROGRESS FOR 5 DAYS     Performed at [facility]   Report Status PENDING    BASIC METABOLIC PANEL     Status: Abnormal   Collection Time    08/01/13  2:59 AM      Result Value Ref Range   Sodium 133 (*) 137 - 147 mEq/L   Potassium 4.2  3.7 - 5.3 mEq/L   Chloride 104  96 - 112 mEq/L   CO2 19  19 - 32 mEq/L   Glucose, Bld 89  70 - 99 mg/dL   BUN 14  6 - 23 mg/dL   Creatinine, Ser 0.70  0.50 -  1.35 mg/dL   Calcium 7.2 (*) 8.4 - 10.5 mg/dL   GFR calc non Af Amer >90  >90 mL/min   GFR calc Af Amer >90  >90 mL/min   Comment: (NOTE)     The eGFR has been calculated using the CKD EPI equation.     This calculation has not been validated in all clinical situations.     eGFR's persistently <90 mL/min signify possible Chronic Kidney     Disease.   Anion gap 10  5 - 15  TYPE AND SCREEN     Status: None   Collection Time    08/01/13  6:45 AM      Result Value Ref Range   ABO/RH(D) B POS     Antibody Screen NEG     Sample Expiration 08/04/2013     Unit Number [id number]     Blood Component Type RED CELLS,LR     Unit division 00     Status of Unit ISSUED     Transfusion Status OK TO TRANSFUSE     Crossmatch Result Compatible     Unit Number [id number]     Blood Component Type RED CELLS,LR     Unit division 00     Status of Unit ISSUED     Transfusion Status OK TO TRANSFUSE  Crossmatch Result Compatible     Unit Number [id number]     Blood Component Type RED CELLS,LR     Unit division 00     Status of Unit ALLOCATED     Transfusion Status OK TO TRANSFUSE     Crossmatch Result Compatible    PREPARE RBC (CROSSMATCH)     Status: None   Collection Time    08/01/13  6:45 AM      Result Value Ref Range   Order Confirmation ORDER PROCESSED BY BLOOD BANK    ABO/RH     Status: None   Collection Time    08/01/13  6:45 AM      Result Value Ref Range   ABO/RH(D) B POS    CBC     Status: Abnormal   Collection Time    08/01/13  9:16 AM      Result Value Ref Range   WBC 8.8  4.0 - 10.5 K/uL   RBC 1.76 (*) 4.22 - 5.81 MIL/uL   Hemoglobin 6.1 (*) 13.0 - 17.0 g/dL   Comment: RESULTS VERIFIED VIA RECOLLECT     REPEATED TO VERIFY     CRITICAL RESULT CALLED TO, READ BACK BY AND VERIFIED WITH:     Everlena Cooper RN [phone] 08/01/2013 BY MACEDA, J   HCT 18.4 (*) 39.0 - 52.0 %   MCV 104.5 (*) 78.0 - 100.0 fL   MCH 34.7 (*) 26.0 - 34.0 pg   MCHC 33.2  30.0 - 36.0 g/dL   RDW  15.0  11.5 - 15.5 %   Platelets 130 (*) 150 - 400 K/uL   Comment: RESULTS VERIFIED VIA RECOLLECT     REPEATED TO VERIFY  PREPARE RBC (CROSSMATCH)     Status: None   Collection Time    08/01/13 11:35 AM      Result Value Ref Range   Order Confirmation ORDER PROCESSED BY BLOOD BANK    GLUCOSE, CAPILLARY     Status: None   Collection Time    08/01/13 11:43 AM      Result Value Ref Range   Glucose-Capillary 92  70 - 99 mg/dL  GLUCOSE, CAPILLARY     Status: Abnormal   Collection Time    08/01/13  2:45 PM      Result Value Ref Range   Glucose-Capillary 107 (*) 70 - 99 mg/dL  CBC WITH DIFFERENTIAL     Status: Abnormal   Collection Time    08/01/13  3:05 PM      Result Value Ref Range   WBC 12.2 (*) 4.0 - 10.5 K/uL   RBC 2.77 (*) 4.22 - 5.81 MIL/uL   Hemoglobin 9.0 (*) 13.0 - 17.0 g/dL   Comment: REPEATED TO VERIFY     POST TRANSFUSION SPECIMEN   HCT 27.2 (*) 39.0 - 52.0 %   MCV 98.2  78.0 - 100.0 fL   MCH 32.5  26.0 - 34.0 pg   MCHC 33.1  30.0 - 36.0 g/dL   RDW 16.7 (*) 11.5 - 15.5 %   Platelets 145 (*) 150 - 400 K/uL   Neutrophils Relative % 91 (*) 43 - 77 %   Neutro Abs 11.0 (*) 1.7 - 7.7 K/uL   Lymphocytes Relative 4 (*) 12 - 46 %   Lymphs Abs 0.5 (*) 0.7 - 4.0 K/uL   Monocytes Relative 4  3 - 12 %   Monocytes Absolute 0.5  0.1 - 1.0 K/uL   Eosinophils Relative 1  0 -  5 %   Eosinophils Absolute 0.1  0.0 - 0.7 K/uL   Basophils Relative 0  0 - 1 %   Basophils Absolute 0.0  0.0 - 0.1 K/uL  BASIC METABOLIC PANEL     Status: Abnormal   Collection Time    08/01/13  3:05 PM      Result Value Ref Range   Sodium 140  137 - 147 mEq/L   Comment: DELTA CHECK NOTED   Potassium 3.7  3.7 - 5.3 mEq/L   Chloride 108  96 - 112 mEq/L   CO2 19  19 - 32 mEq/L   Glucose, Bld 100 (*) 70 - 99 mg/dL   BUN 18  6 - 23 mg/dL   Creatinine, Ser 0.69  0.50 - 1.35 mg/dL   Calcium 6.9 (*) 8.4 - 10.5 mg/dL   GFR calc non Af Amer >90  >90 mL/min   GFR calc Af Amer >90  >90 mL/min   Comment:  (NOTE)     The eGFR has been calculated using the CKD EPI equation.     This calculation has not been validated in all clinical situations.     eGFR's persistently <90 mL/min signify possible Chronic Kidney     Disease.   Anion gap 13  5 - 15  LACTIC ACID, PLASMA     Status: None   Collection Time    08/01/13  3:05 PM      Result Value Ref Range   Lactic Acid, Venous 0.8  0.5 - 2.2 mmol/L  CORTISOL     Status: None   Collection Time    08/01/13  4:00 PM      Result Value Ref Range   Cortisol, Plasma 21.8     Comment: (NOTE)     AM:  4.3 - 22.4 ug/dL     PM:  3.1 - 16.7 ug/dL     Performed at [facility]     Status: Abnormal   Collection Time    08/01/13  4:27 PM      Result Value Ref Range   Color, Urine AMBER (*) YELLOW   Comment: BIOCHEMICALS MAY BE AFFECTED BY COLOR   APPearance CLOUDY (*) CLEAR   Specific Gravity, Urine 1.014  1.005 - 1.030   pH 6.5  5.0 - 8.0   Glucose, UA NEGATIVE  NEGATIVE mg/dL   Hgb urine dipstick LARGE (*) NEGATIVE   Bilirubin Urine NEGATIVE  NEGATIVE   Ketones, ur NEGATIVE  NEGATIVE mg/dL   Protein, ur NEGATIVE  NEGATIVE mg/dL   Urobilinogen, UA 2.0 (*) 0.0 - 1.0 mg/dL   Nitrite POSITIVE (*) NEGATIVE   Leukocytes, UA SMALL (*) NEGATIVE  URINE MICROSCOPIC-ADD ON     Status: None   Collection Time    08/01/13  4:27 PM      Result Value Ref Range   Squamous Epithelial / LPF RARE  RARE   WBC, UA 11-20  <3 WBC/hpf   RBC / HPF 21-50  <3 RBC/hpf   Bacteria, UA RARE  RARE  POCT I-STAT 3, ART BLOOD GAS (G3+)     Status: Abnormal   Collection Time    08/01/13  4:41 PM      Result Value Ref Range   pH, Arterial 7.305 (*) 7.350 - 7.450   pCO2 arterial 33.9 (*) 35.0 - 45.0 mmHg   pO2, Arterial 76.0 (*) 80.0 - 100.0 mmHg   Bicarbonate 16.9 (*) 20.0 - 24.0 mEq/L  TCO2 18  0 - 100 mmol/L   O2 Saturation 94.0     Acid-base deficit 9.0 (*) 0.0 - 2.0 mmol/L   Patient temperature 98.6 F     Collection  site RADIAL, ALLEN'S TEST ACCEPTABLE     Sample type ARTERIAL    TROPONIN I     Status: None   Collection Time    08/01/13  5:30 PM      Result Value Ref Range   Troponin I <0.30  <0.30 ng/mL   Comment:            Due to the release kinetics of cTnI,     a negative result within the first hours     of the onset of symptoms does not rule out     myocardial infarction with certainty.     If myocardial infarction is still suspected,     repeat the test at appropriate intervals.  GLUCOSE, CAPILLARY     Status: Abnormal   Collection Time    08/01/13  8:01 PM      Result Value Ref Range   Glucose-Capillary 111 (*) 70 - 99 mg/dL  TROPONIN I     Status: Abnormal   Collection Time    08/01/13  9:20 PM      Result Value Ref Range   Troponin I 0.31 (*) <0.30 ng/mL   Comment: REPEATED TO VERIFY     CRITICAL RESULT CALLED TO, READ BACK BY AND VERIFIED WITH:     G HARDEN,RN [id number] 08/01/13 WBOND                Due to the release kinetics of cTnI,     a negative result within the first hours     of the onset of symptoms does not rule out     myocardial infarction with certainty.     If myocardial infarction is still suspected,     repeat the test at appropriate intervals.  BASIC METABOLIC PANEL     Status: Abnormal   Collection Time    08/02/13  6:35 AM      Result Value Ref Range   Sodium 138  137 - 147 mEq/L   Potassium 3.3 (*) 3.7 - 5.3 mEq/L   Chloride 108  96 - 112 mEq/L   CO2 19  19 - 32 mEq/L   Glucose, Bld 90  70 - 99 mg/dL   BUN 13  6 - 23 mg/dL   Creatinine, Ser 0.57  0.50 - 1.35 mg/dL   Calcium 7.1 (*) 8.4 - 10.5 mg/dL   GFR calc non Af Amer >90  >90 mL/min   GFR calc Af Amer >90  >90 mL/min   Comment: (NOTE)     The eGFR has been calculated using the CKD EPI equation.     This calculation has not been validated in all clinical situations.     eGFR's persistently <90 mL/min signify possible Chronic Kidney     Disease.   Anion gap 11  5 - 15  CBC     Status: Abnormal    Collection Time    08/02/13  6:35 AM      Result Value Ref Range   WBC 13.4 (*) 4.0 - 10.5 K/uL   RBC 3.06 (*) 4.22 - 5.81 MIL/uL   Hemoglobin 9.7 (*) 13.0 - 17.0 g/dL   HCT 29.0 (*) 39.0 - 52.0 %   MCV 94.8  78.0 - 100.0 fL   MCH 31.7  26.0 - 34.0 pg   MCHC 33.4  30.0 - 36.0 g/dL   RDW 18.2 (*) 11.5 - 15.5 %   Platelets 149 (*) 150 - 400 K/uL      Component Value Date/Time   SDES ABSCESS RIGHT HIP 07/31/2013 1336   SDES ABSCESS RIGHT HIP 07/31/2013 1336   SPECREQUEST POF ZOSYN AND VANCOMYCIN 07/31/2013 1336   SPECREQUEST POF ZOSYN AND VANCOMYCIN 07/31/2013 1336   CULT  Value: Culture reincubated for better growth Performed at [facility] 07/31/2013 1336   CULT  Value: NO ANAEROBES ISOLATED; CULTURE IN PROGRESS FOR 5 DAYS Performed at [facility] 07/31/2013 1336   REPTSTATUS PENDING 07/31/2013 1336   REPTSTATUS PENDING 07/31/2013 1336   Dg Chest Port 1 View  08/01/2013   CLINICAL DATA:  Central venous line placement.  EXAM: PORTABLE CHEST - 1 VIEW  COMPARISON:  08/01/2013.  FINDINGS: Central line noted with its tip projected over the cavoatrial junction. Mediastinum and hilar structures are normal.Borderline cardiomegaly, slight prominence of pulmonary vascularity. Very subtle Kerley B-lines are noted. Very mild congestive heart failure cannot be excluded. No pleural effusion or pneumothorax. No acute bony abnormality.  IMPRESSION: 1. Central line in good anatomic position. 2. Can't exclude mild congestive heart failure.   Electronically Signed   By: Marcello Moores  Register   On: 08/01/2013 15:16   Dg Chest Port 1 View  08/01/2013   CLINICAL DATA:  Postop  EXAM: PORTABLE CHEST - 1 VIEW  COMPARISON:  12/24/2012  FINDINGS: Heart, mediastinum and hila are unremarkable. Lungs are hyperexpanded with mild interstitial prominence and evidence of apical emphysema. No lung consolidation or edema. No pleural effusion or pneumothorax.  Bony thorax is intact.  IMPRESSION: No acute cardiopulmonary  disease.   Electronically Signed   By: Lajean Manes M.D.   On: 08/01/2013 09:10   Recent Results (from the past 240 hour(s))  CULTURE, BLOOD (ROUTINE X 2)     Status: None   Collection Time    07/30/13 11:35 PM      Result Value Ref Range Status   Specimen Description BLOOD RIGHT ARM   Final   Special Requests BOTTLES DRAWN AEROBIC AND ANAEROBIC 5CC   Final   Culture  Setup Time     Final   Value: 07/31/2013 08:53     Performed at [facility]   Culture     Final   Value:        BLOOD CULTURE RECEIVED NO GROWTH TO DATE CULTURE WILL BE HELD FOR 5 DAYS BEFORE ISSUING A FINAL NEGATIVE REPORT     Performed at [facility]   Report Status PENDING   Incomplete  CULTURE, BLOOD (ROUTINE X 2)     Status: None   Collection Time    07/31/13 12:25 AM      Result Value Ref Range Status   Specimen Description BLOOD LEFT ARM   Final   Special Requests BOTTLES DRAWN AEROBIC AND ANAEROBIC 6CC   Final   Culture  Setup Time     Final   Value: 07/31/2013 08:53     Performed at [facility]   Culture     Final   Value:        BLOOD CULTURE RECEIVED NO GROWTH TO DATE CULTURE WILL BE HELD FOR 5 DAYS BEFORE ISSUING A FINAL NEGATIVE REPORT     Performed at [facility]   Report Status PENDING   Incomplete  MRSA PCR SCREENING  Status: None   Collection Time    07/31/13  3:47 AM      Result Value Ref Range Status   MRSA by PCR NEGATIVE  NEGATIVE Final   Comment:            The GeneXpert MRSA Assay (FDA     approved for NASAL specimens     only), is one component of a     comprehensive MRSA colonization     surveillance program. It is not     intended to diagnose MRSA     infection nor to guide or     monitor treatment for     MRSA infections.  URINE CULTURE     Status: None   Collection Time    07/31/13  6:29 AM      Result Value Ref Range Status   Specimen Description URINE, RANDOM   Final   Special Requests Immunocompromised   Final   Culture  Setup Time      Final   Value: 07/31/2013 13:32     Performed at [facility]     Final   Value: NO GROWTH     Performed at [facility]   Culture     Final   Value: NO GROWTH     Performed at [facility]   Report Status 08/01/2013 FINAL   Final  CULTURE, ROUTINE-ABSCESS     Status: None   Collection Time    07/31/13  1:36 PM      Result Value Ref Range Status   Specimen Description ABSCESS RIGHT HIP   Final   Special Requests POF ZOSYN AND VANCOMYCIN   Final   Gram Stain     Final   Value: ABUNDANT WBC PRESENT,BOTH PMN AND MONONUCLEAR     NO SQUAMOUS EPITHELIAL CELLS SEEN     MODERATE GRAM POSITIVE COCCI     IN PAIRS IN CHAINS     Performed at [facility]   Culture     Final   Value: Culture reincubated for better growth     Performed at [facility]   Report Status PENDING   Incomplete  ANAEROBIC CULTURE     Status: None   Collection Time    07/31/13  1:36 PM      Result Value Ref Range Status   Specimen Description ABSCESS RIGHT HIP   Final   Special Requests POF ZOSYN AND VANCOMYCIN   Final   Gram Stain     Final   Value: ABUNDANT WBC PRESENT,BOTH PMN AND MONONUCLEAR     NO SQUAMOUS EPITHELIAL CELLS SEEN     MODERATE GRAM POSITIVE COCCI     IN PAIRS IN CHAINS     Performed at [facility]   Culture     Final   Value: NO ANAEROBES ISOLATED; CULTURE IN PROGRESS FOR 5 DAYS     Performed at [facility]   Report Status PENDING   Incomplete      08/02/2013, 11:38 AM     LOS: 3 days     **Disclaimer: This note may have been dictated with voice recognition software. Similar sounding words can inadvertently be transcribed and this note may contain transcription errors which may not have been corrected upon publication of note.**

## 2013-08-02 NOTE — Progress Notes (Signed)
ANTIBIOTIC CONSULT NOTE  Pharmacy Consult for Vancocin and Rocephin Indication: septic arthritis w/ osteomyelitis  No Known Allergies  Patient Measurements: Height: 6\' 1"  (185.4 cm) Weight: 142 lb 6.7 oz (64.6 kg) IBW/kg (Calculated) : 79.9  Vital Signs: Temp: 97.5 F (36.4 C) (07/25 1544) Temp src: Oral (07/25 1544) BP: 142/86 mmHg (07/25 1800) Pulse Rate: 58 (07/25 1800)  Labs:  Recent Labs  08/01/13 0259 08/01/13 0916 08/01/13 1505 08/02/13 0635  WBC  --  8.8 12.2* 13.4*  HGB  --  6.1* 9.0* 9.7*  PLT  --  130* 145* 149*  CREATININE 0.70  --  0.69 0.57   Estimated Creatinine Clearance: 99.8 ml/min (by C-G formula based on Cr of 0.57).    Assessment: 53yo male was sent to ED by [facility] for evidence of hip infection after office visit w/ c/o inability to bear weight on right side, CT is consistent w/ septic arthritis of right hip and osteomyelitis of acetabulum and probable early osteo of femoral head, to begin IV abx.  Vancomycin since 07/31/13  Vancomycin trough = 11.1 (therapeutic)   Goal of Therapy:  Vancomcyin trough = 10 to 15 mcg / dL  Plan:  -Continue vancomycin 500 mg IV q8h --F/u blood, urine cx  Thank you. Anette Guarneri, PharmD [phone]  08/02/2013 7:21 PM

## 2013-08-02 NOTE — Procedures (Signed)
Supervised and present through the entire procedure.  Toluwanimi Radebaugh, MD Pulmonary and Critical Care Medicine [facility] Pager: [phone]  

## 2013-08-02 NOTE — Progress Notes (Signed)
eLink Physician-Brief Progress Note Patient Name: [name] [name] DOB: Jul 30, 1960 MRN: [id number]  Date of Service  08/02/2013   HPI/Events of Note  Low potassium   eICU Interventions  replaced   Intervention Category Minor Interventions: Electrolytes abnormality - evaluation and management  Mauri Brooklyn, P 08/02/2013, 7:18 PM

## 2013-08-02 NOTE — Progress Notes (Signed)
  Echocardiogram 2D Echocardiogram has been performed.  [name] [name] FRANCES 08/02/2013, 10:06 AM

## 2013-08-03 DIAGNOSIS — A419 Sepsis, unspecified organism: Secondary | ICD-10-CM | POA: Diagnosis present

## 2013-08-03 DIAGNOSIS — R6521 Severe sepsis with septic shock: Secondary | ICD-10-CM

## 2013-08-03 DIAGNOSIS — E876 Hypokalemia: Secondary | ICD-10-CM

## 2013-08-03 LAB — CBC
HCT: 32.1 % — ABNORMAL LOW (ref 39.0–52.0)
Hemoglobin: 11 g/dL — ABNORMAL LOW (ref 13.0–17.0)
MCH: 32.3 pg (ref 26.0–34.0)
MCHC: 34.3 g/dL (ref 30.0–36.0)
MCV: 94.1 fL (ref 78.0–100.0)
PLATELETS: 152 10*3/uL (ref 150–400)
RBC: 3.41 MIL/uL — ABNORMAL LOW (ref 4.22–5.81)
RDW: 17.7 % — AB (ref 11.5–15.5)
WBC: 10.2 10*3/uL (ref 4.0–10.5)

## 2013-08-03 LAB — CULTURE, ROUTINE-ABSCESS

## 2013-08-03 LAB — BASIC METABOLIC PANEL
Anion gap: 11 (ref 5–15)
BUN: 9 mg/dL (ref 6–23)
CO2: 23 mEq/L (ref 19–32)
CREATININE: 0.52 mg/dL (ref 0.50–1.35)
Calcium: 7.5 mg/dL — ABNORMAL LOW (ref 8.4–10.5)
Chloride: 103 mEq/L (ref 96–112)
Glucose, Bld: 101 mg/dL — ABNORMAL HIGH (ref 70–99)
Potassium: 3.4 mEq/L — ABNORMAL LOW (ref 3.7–5.3)
Sodium: 137 mEq/L (ref 137–147)

## 2013-08-03 LAB — TROPONIN I: Troponin I: <0.3 ng/mL (ref <0.30)

## 2013-08-03 MED ORDER — SODIUM CHLORIDE 0.9 % IV BOLUS (SEPSIS)
500.0000 mL | Freq: Once | INTRAVENOUS | Status: AC
  Administered 2013-08-03: 500 mL via INTRAVENOUS

## 2013-08-03 MED ORDER — POTASSIUM CHLORIDE 10 MEQ/50ML IV SOLN
10.0000 meq | INTRAVENOUS | Status: AC
  Administered 2013-08-03 (×2): 10 meq via INTRAVENOUS
  Filled 2013-08-03 (×2): qty 50

## 2013-08-03 NOTE — Progress Notes (Signed)
PULMONARY / CRITICAL CARE MEDICINE   Name: [name] [name] MRN: [id number] DOB: 1961-01-08    ADMISSION DATE:  07/30/2013 CONSULTATION DATE:  08/03/2013   REFERRING MD :  Kristine Garbe   CHIEF COMPLAINT:  Low BP  INITIAL PRESENTATION: 53 year old, HIV positive admitted 7/22 with septic arthritis of right hip and abscess extending into the pelvis, underwent I&D and drainage on 7/23 followed by septic shock, hypotensive in spite of full resuscitation. 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 There is probable early osteomyelitis of the femoral head.  Associated infected iliopsoas bursa with superior extension of a  large abscess into the pelvis between the iliacus muscle and the right iliac bone.Decubitus ulcer over the lower sacrum and coccyx.   SIGNIFICANT EVENTS: 7/23 >> Right hip/pelvis, limited I and D and placement of close suction drainage intra and extra pelvic areas [name])   SUBJECTIVE:  Feels better overall.  Remains on dopamine.   VITAL SIGNS: Temp:  [97.5 F (36.4 C)-98.3 F (36.8 C)] 97.7 F (36.5 C) (07/26 0731) Pulse Rate:  [37-78] 63 (07/26 0900) Resp:  [12-28] 15 (07/26 0900) BP: (55-152)/(30-94) 132/91 mmHg (07/26 0800) SpO2:  [94 %-100 %] 100 % (07/26 0900)  HEMODYNAMICS: CVP:  [3 mmHg-4 mmHg] 3 mmHg  VENTILATOR SETTINGS:   INTAKE / OUTPUT:  Intake/Output Summary (Last 24 hours) at 08/03/13 1256 Last data filed at 08/03/13 0800  Gross per 24 hour  Intake 2934.99 ml  Output   7005 ml  Net -4070.01 ml    PHYSICAL EXAMINATION: Gen. Pleasant, chronically ill-appearing, in no distress, depressed affect ENT - no lesions, no post nasal drip, pallor + Neck: No JVD, no thyromegaly, no carotid bruits Lungs: resps even non labored on Muse, clear  Cardiovascular: Rhythm regular, heart sounds  normal, no murmurs, no peripheral edema Abdomen: soft and  non-tender, midline scar ,no hepatosplenomegaly, BS normal. Musculoskeletal: No deformities, no cyanosis or clubbing, R hip/groind dressing c/d with mult JP drains  Neuro:  alert, non focal Skin:  Warm, no lesions/ rash, good cap refill, sacral decub -unstagable (at least4) 6cm x 5cm   LABS:  CBC  Recent Labs Lab 08/01/13 1505 08/02/13 0635 08/03/13 0634  WBC 12.2* 13.4* 10.2  HGB 9.0* 9.7* 11.0*  HCT 27.2* 29.0* 32.1*  PLT 145* 149* 152   Coag's  Recent Labs Lab 07/31/13 0530  INR 1.62*   BMET  Recent Labs Lab 08/01/13 1505 08/02/13 0635 08/03/13 0439  NA 140 138 137  K 3.7 3.3* 3.4*  CL 108 108 103  CO2 19 19 23   BUN 18 13 9   CREATININE 0.69 0.57 0.52  GLUCOSE 100* 90 101*   Electrolytes  Recent Labs Lab 08/01/13 1505 08/02/13 0635 08/03/13 0439  CALCIUM 6.9* 7.1* 7.5*   Sepsis Markers  Recent Labs Lab 07/30/13 2342 08/01/13 1505 08/02/13 1207  LATICACIDVEN 1.58 0.8 1.0   ABG  Recent Labs Lab 08/01/13 1641  PHART 7.305*  PCO2ART 33.9*  PO2ART 76.0*   Liver Enzymes  Recent Labs Lab 07/30/13 2335  AST 34  ALT 21  ALKPHOS 97  BILITOT 0.7  ALBUMIN 2.0*   Cardiac Enzymes  Recent Labs Lab 08/02/13 1206 08/02/13 1810 08/03/13 0001  TROPONINI <0.30 <0.30 <0.30   Glucose  Recent Labs Lab 08/01/13 1143 08/01/13 1445 08/01/13 2001 08/02/13 1951  GLUCAP 92 107* 111* 110*    Imaging No results found.  ASSESSMENT / PLAN:  PULMONARY A: Smoker P:   Smoking cessation  supplemental O2  CARDIOVASCULAR  A: Septic shock Transient sinus bradycardia P:  Gentle volume (s/p aggressive volume resuscitation) - NS bolus x1  Prefer use dopamine given relative bradycardia  RENAL A:  No active issue  P:   Monitor urine output F/u chem - at risk AKI with hypotension   GASTROINTESTINAL A:  No issues P:   Regular diet  HEMATOLOGIC A:  Acute blood loss anemia Thrombocytopenia P:  Transfuse to for hgb <7 Monitor  platelets  INFECTIOUS A:  Septic arthritis - iliopsoas abscess  Osteomyelitis R hip Pelvic abscess Sacral decubitus HIV P:   BCx2 7/23 no growth UC 7/23 no growth Abscess 7/23 GPC pairs and chains>> Mod Group B strep  Abx: Imipenem, start date 7/23>>> ID following  Ortho following hip drains HIV meds per ID  vanc stopper per ID  Cont broad GNR coverage with decub  Surgical input for debridement of sacral wound, once hemodynamics improved   ENDOCRINE A:  No issues   P:   F/u glucose on chem   NEUROLOGIC A:  No issues P:   PRN pain rx  PT/OT    TODAY'S SUMMARY:  Continue abx, ID following.  Will give additional gentle volume with continued pressor needs and relatively low CVP.    Nickolas Madrid, NP 08/03/2013  12:56 PM Pager: [phone] or [phone]  *Care during the described time interval was provided by me and/or other providers on the critical care team. I have reviewed this patient's available data, including medical history, events of note, physical examination and test results as part of my evaluation.  Merton Border, MD ; [facility] [phone].  After 5:30 PM or weekends, call [phone]

## 2013-08-03 NOTE — Progress Notes (Signed)
INFECTIOUS DISEASE PROGRESS NOTE  ID: [name] [name] is a 53 y.o. male with  Principal Problem:   Septic arthritis of hip Active Problems:   HIV DISEASE   ANEMIA, CHRONIC   Decubitus ulcer of sacral region, stage 4   Protein-calorie malnutrition, severe  Subjective: Complains of condom cath.   Abtx:  Anti-infectives   Start     Dose/Rate Route Frequency Ordered Stop   08/03/13 0400  vancomycin (VANCOCIN) 500 mg in sodium chloride 0.9 % 100 mL IVPB     500 mg 100 mL/hr over 60 Minutes Intravenous Every 8 hours 08/02/13 2241     08/01/13 1100  vancomycin (VANCOCIN) 500 mg in sodium chloride 0.9 % 100 mL IVPB  Status:  Discontinued     500 mg 100 mL/hr over 60 Minutes Intravenous Every 8 hours 08/01/13 1057 08/02/13 2241   08/01/13 1000  lamiVUDine-zidovudine (COMBIVIR) 150-300 MG per tablet 1 tablet     1 tablet Oral 2 times daily 07/31/13 0346     07/31/13 1615  sulfamethoxazole-trimethoprim (BACTRIM DS) 800-160 MG per tablet 1 tablet     1 tablet Oral Daily 07/31/13 1402     07/31/13 1415  sulfamethoxazole-trimethoprim (BACTRIM DS) 800-160 MG per tablet 1 tablet  Status:  Discontinued     1 tablet Oral Every 12 hours 07/31/13 1400 07/31/13 1402   07/31/13 1400  piperacillin-tazobactam (ZOSYN) IVPB 3.375 g     3.375 g 12.5 mL/hr over 240 Minutes Intravenous 3 times per day 07/31/13 1208     07/31/13 1000  tenofovir (VIREAD) tablet 300 mg     300 mg Oral Daily 07/31/13 0346     07/31/13 1000  cefTRIAXone (ROCEPHIN) 1 g in dextrose 5 % 50 mL IVPB  Status:  Discontinued     1 g 100 mL/hr over 30 Minutes Intravenous Every 12 hours 07/31/13 0331 07/31/13 1208   07/31/13 0800  vancomycin (VANCOCIN) 500 mg in sodium chloride 0.9 % 100 mL IVPB  Status:  Discontinued     500 mg 100 mL/hr over 60 Minutes Intravenous Every 8 hours 07/31/13 0331 07/31/13 1543   07/31/13 0700  atazanavir (REYATAZ) capsule 300 mg     300 mg Oral Daily with breakfast 07/31/13 0346     07/31/13 0700   ritonavir (NORVIR) tablet 100 mg     100 mg Oral Daily with breakfast 07/31/13 0346     07/31/13 0030  cefTRIAXone (ROCEPHIN) 2 g in dextrose 5 % 50 mL IVPB     2 g 100 mL/hr over 30 Minutes Intravenous  Once 07/31/13 0019 07/31/13 0115   07/31/13 0000  vancomycin (VANCOCIN) IVPB 1000 mg/200 mL premix     1,000 mg 200 mL/hr over 60 Minutes Intravenous  Once 07/30/13 2345 07/31/13 0302      Medications:  Scheduled: . antiseptic oral rinse  15 mL Mouth Rinse q12n4p  . atazanavir  300 mg Oral Q breakfast  . chlorhexidine  15 mL Mouth Rinse BID  . collagenase   Topical Daily  . feeding supplement (ENSURE COMPLETE)  237 mL Oral BID BM  . lamiVUDine-zidovudine  1 tablet Oral BID  . nystatin  5 mL Oral QID  . piperacillin-tazobactam (ZOSYN)  IV  3.375 g Intravenous 3 times per day  . ritonavir  100 mg Oral Q breakfast  . sulfamethoxazole-trimethoprim  1 tablet Oral Daily  . tenofovir  300 mg Oral Daily  . vancomycin  500 mg Intravenous Q8H    Objective: Vital  signs in last 24 hours: Temp:  [97.5 F (36.4 C)-98.3 F (36.8 C)] 97.7 F (36.5 C) (07/26 0731) Pulse Rate:  [37-78] 63 (07/26 0900) Resp:  [12-28] 15 (07/26 0900) BP: (55-152)/(30-94) 132/91 mmHg (07/26 0800) SpO2:  [94 %-100 %] 100 % (07/26 0900)   General appearance: alert, cooperative and no distress Resp: clear to auscultation bilaterally Cardio: regular rate and rhythm GI: normal findings: bowel sounds normal and soft, non-tender and abd dressed, drain in place.   Lab Results  Recent Labs  08/02/13 0635 08/03/13 0439 08/03/13 0634  WBC 13.4*  --  10.2  HGB 9.7*  --  11.0*  HCT 29.0*  --  32.1*  NA 138 137  --   K 3.3* 3.4*  --   CL 108 103  --   CO2 19 23  --   BUN 13 9  --   CREATININE 0.57 0.52  --    Liver Panel No results found for this basename: PROT, ALBUMIN, AST, ALT, ALKPHOS, BILITOT, BILIDIR, IBILI,  in the last 72 hours Sedimentation Rate No results found for this basename: ESRSEDRATE,   in the last 72 hours C-Reactive Protein No results found for this basename: CRP,  in the last 72 hours  Microbiology: Recent Results (from the past 240 hour(s))  CULTURE, BLOOD (ROUTINE X 2)     Status: None   Collection Time    07/30/13 11:35 PM      Result Value Ref Range Status   Specimen Description BLOOD RIGHT ARM   Final   Special Requests BOTTLES DRAWN AEROBIC AND ANAEROBIC 5CC   Final   Culture  Setup Time     Final   Value: 07/31/2013 08:53     Performed at [facility]   Culture     Final   Value:        BLOOD CULTURE RECEIVED NO GROWTH TO DATE CULTURE WILL BE HELD FOR 5 DAYS BEFORE ISSUING A FINAL NEGATIVE REPORT     Performed at [facility]   Report Status PENDING   Incomplete  CULTURE, BLOOD (ROUTINE X 2)     Status: None   Collection Time    07/31/13 12:25 AM      Result Value Ref Range Status   Specimen Description BLOOD LEFT ARM   Final   Special Requests BOTTLES DRAWN AEROBIC AND ANAEROBIC 6CC   Final   Culture  Setup Time     Final   Value: 07/31/2013 08:53     Performed at [facility]   Culture     Final   Value:        BLOOD CULTURE RECEIVED NO GROWTH TO DATE CULTURE WILL BE HELD FOR 5 DAYS BEFORE ISSUING A FINAL NEGATIVE REPORT     Performed at [facility]   Report Status PENDING   Incomplete  MRSA PCR SCREENING     Status: None   Collection Time    07/31/13  3:47 AM      Result Value Ref Range Status   MRSA by PCR NEGATIVE  NEGATIVE Final   Comment:            The GeneXpert MRSA Assay (FDA     approved for NASAL specimens     only), is one component of a     comprehensive MRSA colonization     surveillance program. It is not     intended to diagnose MRSA     infection nor  to guide or     monitor treatment for     MRSA infections.  URINE CULTURE     Status: None   Collection Time    07/31/13  6:29 AM      Result Value Ref Range Status   Specimen Description URINE, RANDOM   Final   Special Requests  Immunocompromised   Final   Culture  Setup Time     Final   Value: 07/31/2013 13:32     Performed at [facility] Count     Final   Value: NO GROWTH     Performed at [facility]   Culture     Final   Value: NO GROWTH     Performed at [facility]   Report Status 08/01/2013 FINAL   Final  CULTURE, ROUTINE-ABSCESS     Status: None   Collection Time    07/31/13  1:36 PM      Result Value Ref Range Status   Specimen Description ABSCESS RIGHT HIP   Final   Special Requests POF ZOSYN AND VANCOMYCIN   Final   Gram Stain     Final   Value: ABUNDANT WBC PRESENT,BOTH PMN AND MONONUCLEAR     NO SQUAMOUS EPITHELIAL CELLS SEEN     MODERATE GRAM POSITIVE COCCI     IN PAIRS IN CHAINS     Performed at [facility]   Culture     Final   Value: MODERATE GROUP B STREP(S.AGALACTIAE)ISOLATED     Note: TESTING AGAINST S. AGALACTIAE NOT ROUTINELY PERFORMED DUE TO PREDICTABILITY OF AMP/PEN/VAN SUSCEPTIBILITY.     Performed at [facility]   Report Status 08/03/2013 FINAL   Final  ANAEROBIC CULTURE     Status: None   Collection Time    07/31/13  1:36 PM      Result Value Ref Range Status   Specimen Description ABSCESS RIGHT HIP   Final   Special Requests POF ZOSYN AND VANCOMYCIN   Final   Gram Stain     Final   Value: ABUNDANT WBC PRESENT,BOTH PMN AND MONONUCLEAR     NO SQUAMOUS EPITHELIAL CELLS SEEN     MODERATE GRAM POSITIVE COCCI     IN PAIRS IN CHAINS     Performed at [facility]   Culture     Final   Value: NO ANAEROBES ISOLATED; CULTURE IN PROGRESS FOR 5 DAYS     Performed at [facility]   Report Status PENDING   Incomplete    Studies/Results: Dg Chest Port 1 View  08/01/2013   CLINICAL DATA:  Central venous line placement.  EXAM: PORTABLE CHEST - 1 VIEW  COMPARISON:  08/01/2013.  FINDINGS: Central line noted with its tip projected over the cavoatrial junction. Mediastinum and hilar structures are normal.Borderline  cardiomegaly, slight prominence of pulmonary vascularity. Very subtle Kerley B-lines are noted. Very mild congestive heart failure cannot be excluded. No pleural effusion or pneumothorax. No acute bony abnormality.  IMPRESSION: 1. Central line in good anatomic position. 2. Can't exclude mild congestive heart failure.   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  Await HIV RNA and genotype Appreciate CCM f/u of  K+         [name] Infectious Diseases (pager) [phone] [email or web address] 08/03/2013, 11:51 AM  LOS: 4 days   **Disclaimer: This note may have been dictated with voice recognition software. Similar sounding words can inadvertently be transcribed and this note may contain transcription errors which may not have been corrected upon publication of note.**

## 2013-08-03 NOTE — Progress Notes (Signed)
Patient ID: [name] [name], male   DOB: Aug 06, 1960, 53 y.o.   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 [name] had spoken to [place] who felt that it could at least initially be managed locally  I will plan to touch base with them again in am to make them aware of my plans and concerns regarding possible iliac wing osteomyelitis and need for more experienced hands at managing this problem.  He will be placed on my OR schedule for tomorrow for hip girdlestone (ie resection of right hip joint) and placement of temporary cemented prosthesis with goal of attempting to deliver more antibiotic and source.  NPO Consent ordered  Risks and complexity of problem reviewed with him including fact that he may need to be transferred to [place] for definitive treatment

## 2013-08-03 NOTE — Progress Notes (Signed)
[facility] ADULT ICU REPLACEMENT PROTOCOL FOR AM LAB REPLACEMENT ONLY  The patient does apply for the [facility] Adult ICU Electrolyte Replacment Protocol based on the criteria listed below:   1. Is GFR >/= 40 ml/min? Yes.    Patient's GFR today is >90 2. Is urine output >/= 0.5 ml/kg/hr for the last 6 hours? Yes.   Patient's UOP is 5.5 ml/kg/hr 3. Is BUN < 60 mg/dL? Yes.    Patient's BUN today is 9 4. Abnormal electrolyte(s): K 3.4 5. Ordered repletion with: per protocol 6. If a panic level lab has been reported, has the CCM MD in charge been notified? No..   Physician:    Ronda Fairly A 08/03/2013 5:22 AM

## 2013-08-04 ENCOUNTER — Inpatient Hospital Stay (HOSPITAL_COMMUNITY): Payer: No Typology Code available for payment source | Admitting: Anesthesiology

## 2013-08-04 ENCOUNTER — Encounter (HOSPITAL_COMMUNITY)
Admission: EM | Disposition: A | Discharge status: Nursing Home (Includes ICF) | Payer: Self-pay | Source: Home / Self Care | Attending: Internal Medicine

## 2013-08-04 ENCOUNTER — Encounter (HOSPITAL_COMMUNITY): Payer: No Typology Code available for payment source | Admitting: Anesthesiology

## 2013-08-04 ENCOUNTER — Encounter (HOSPITAL_COMMUNITY): Payer: Self-pay | Admitting: Anesthesiology

## 2013-08-04 DIAGNOSIS — L8994 Pressure ulcer of unspecified site, stage 4: Secondary | ICD-10-CM

## 2013-08-04 DIAGNOSIS — E43 Unspecified severe protein-calorie malnutrition: Secondary | ICD-10-CM

## 2013-08-04 DIAGNOSIS — R599 Enlarged lymph nodes, unspecified: Secondary | ICD-10-CM

## 2013-08-04 HISTORY — PX: TOTAL HIP ARTHROPLASTY: SHX124

## 2013-08-04 LAB — TYPE AND SCREEN
ABO/RH(D): B POS
Antibody Screen: NEGATIVE
UNIT DIVISION: 0
Unit division: 0
Unit division: 0

## 2013-08-04 LAB — BASIC METABOLIC PANEL
ANION GAP: 13 (ref 5–15)
BUN: 6 mg/dL (ref 6–23)
CALCIUM: 7.5 mg/dL — AB (ref 8.4–10.5)
CO2: 24 mEq/L (ref 19–32)
CREATININE: 0.52 mg/dL (ref 0.50–1.35)
Chloride: 101 mEq/L (ref 96–112)
GFR calc Af Amer: >90 mL/min (ref >90)
GFR calc non Af Amer: >90 mL/min (ref >90)
Glucose, Bld: 90 mg/dL (ref 70–99)
Potassium: 3.1 mEq/L — ABNORMAL LOW (ref 3.7–5.3)
Sodium: 138 mEq/L (ref 137–147)

## 2013-08-04 LAB — CBC
HCT: 31.2 % — ABNORMAL LOW (ref 39.0–52.0)
Hemoglobin: 10.5 g/dL — ABNORMAL LOW (ref 13.0–17.0)
MCH: 31.8 pg (ref 26.0–34.0)
MCHC: 33.7 g/dL (ref 30.0–36.0)
MCV: 94.5 fL (ref 78.0–100.0)
Platelets: 145 10*3/uL — ABNORMAL LOW (ref 150–400)
RBC: 3.3 MIL/uL — ABNORMAL LOW (ref 4.22–5.81)
RDW: 16.9 % — AB (ref 11.5–15.5)
WBC: 8.6 10*3/uL (ref 4.0–10.5)

## 2013-08-04 LAB — PHOSPHORUS
PHOSPHORUS: 1.5 mg/dL — AB (ref 2.3–4.6)
Phosphorus: 3.5 mg/dL (ref 2.3–4.6)

## 2013-08-04 LAB — HIV-1 RNA ULTRAQUANT REFLEX TO GENTYP+
HIV 1 RNA Quant: 251 copies/mL — ABNORMAL HIGH (ref <20)
HIV-1 RNA Quant, Log: 2.4 {Log} — ABNORMAL HIGH (ref <1.30)

## 2013-08-04 LAB — MAGNESIUM
Magnesium: 1.3 mg/dL — ABNORMAL LOW (ref 1.5–2.5)
Magnesium: 2.4 mg/dL (ref 1.5–2.5)

## 2013-08-04 LAB — HIV-1 RNA QUANT-NO REFLEX-BLD
HIV 1 RNA QUANT: 237 {copies}/mL — AB (ref <20)
HIV-1 RNA QUANT, LOG: 2.37 {Log} — AB (ref <1.30)

## 2013-08-04 SURGERY — ARTHROPLASTY, HIP, TOTAL,POSTERIOR APPROACH
Anesthesia: General | Site: Hip | Laterality: Right

## 2013-08-04 MED ORDER — TOBRAMYCIN SULFATE 1.2 G IJ SOLR
INTRAMUSCULAR | Status: DC | PRN
  Administered 2013-08-04 (×3): 1.2 g

## 2013-08-04 MED ORDER — FENTANYL CITRATE 0.05 MG/ML IJ SOLN
INTRAMUSCULAR | Status: DC | PRN
  Administered 2013-08-04: 150 ug via INTRAVENOUS
  Administered 2013-08-04: 50 ug via INTRAVENOUS

## 2013-08-04 MED ORDER — PROPOFOL 10 MG/ML IV BOLUS
INTRAVENOUS | Status: DC | PRN
  Administered 2013-08-04: 100 mg via INTRAVENOUS

## 2013-08-04 MED ORDER — GLYCOPYRROLATE 0.2 MG/ML IJ SOLN
INTRAMUSCULAR | Status: AC
  Filled 2013-08-04: qty 1

## 2013-08-04 MED ORDER — LACTATED RINGERS IV SOLN
INTRAVENOUS | Status: DC | PRN
  Administered 2013-08-04 (×2): via INTRAVENOUS

## 2013-08-04 MED ORDER — ONDANSETRON HCL 4 MG/2ML IJ SOLN
INTRAMUSCULAR | Status: DC | PRN
  Administered 2013-08-04: 4 mg via INTRAVENOUS

## 2013-08-04 MED ORDER — FENTANYL CITRATE 0.05 MG/ML IJ SOLN
INTRAMUSCULAR | Status: AC
  Filled 2013-08-04: qty 5

## 2013-08-04 MED ORDER — NEOSTIGMINE METHYLSULFATE 10 MG/10ML IV SOLN
INTRAVENOUS | Status: DC | PRN
  Administered 2013-08-04: 3 mg via INTRAVENOUS

## 2013-08-04 MED ORDER — SODIUM CHLORIDE 0.9 % IV SOLN
6.0000 g | Freq: Once | INTRAVENOUS | Status: AC
  Administered 2013-08-04: 6 g via INTRAVENOUS
  Filled 2013-08-04: qty 12

## 2013-08-04 MED ORDER — LIDOCAINE HCL (CARDIAC) 20 MG/ML IV SOLN
INTRAVENOUS | Status: DC | PRN
  Administered 2013-08-04: 100 mg via INTRAVENOUS

## 2013-08-04 MED ORDER — GLYCOPYRROLATE 0.2 MG/ML IJ SOLN
INTRAMUSCULAR | Status: DC | PRN
  Administered 2013-08-04: 0.2 mg via INTRAVENOUS
  Administered 2013-08-04: 0.4 mg via INTRAVENOUS

## 2013-08-04 MED ORDER — DOPAMINE-DEXTROSE 3.2-5 MG/ML-% IV SOLN
INTRAVENOUS | Status: DC | PRN
  Administered 2013-08-04: 10 ug/kg/min via INTRAVENOUS

## 2013-08-04 MED ORDER — POTASSIUM CHLORIDE 10 MEQ/50ML IV SOLN
10.0000 meq | INTRAVENOUS | Status: AC
  Administered 2013-08-04 (×4): 10 meq via INTRAVENOUS
  Filled 2013-08-04 (×4): qty 50

## 2013-08-04 MED ORDER — ETOMIDATE 2 MG/ML IV SOLN
INTRAVENOUS | Status: DC | PRN
  Administered 2013-08-04: 12 mg via INTRAVENOUS

## 2013-08-04 MED ORDER — PHENYLEPHRINE HCL 10 MG/ML IJ SOLN
10.0000 mg | INTRAMUSCULAR | Status: DC | PRN
  Administered 2013-08-04: 20 ug/min via INTRAVENOUS

## 2013-08-04 MED ORDER — ROCURONIUM BROMIDE 50 MG/5ML IV SOLN
INTRAVENOUS | Status: AC
  Filled 2013-08-04: qty 1

## 2013-08-04 MED ORDER — MINERAL OIL LIGHT 100 % EX OIL
TOPICAL_OIL | CUTANEOUS | Status: DC | PRN
  Administered 2013-08-04: 1 via TOPICAL

## 2013-08-04 MED ORDER — MIDAZOLAM HCL 5 MG/5ML IJ SOLN
INTRAMUSCULAR | Status: DC | PRN
  Administered 2013-08-04: 2 mg via INTRAVENOUS

## 2013-08-04 MED ORDER — PHENYLEPHRINE HCL 10 MG/ML IJ SOLN
INTRAMUSCULAR | Status: DC | PRN
  Administered 2013-08-04: 80 ug via INTRAVENOUS
  Administered 2013-08-04: 40 ug via INTRAVENOUS
  Administered 2013-08-04 (×2): 80 ug via INTRAVENOUS

## 2013-08-04 MED ORDER — MIDAZOLAM HCL 2 MG/2ML IJ SOLN
INTRAMUSCULAR | Status: AC
  Filled 2013-08-04: qty 2

## 2013-08-04 MED ORDER — PHENYLEPHRINE 40 MCG/ML (10ML) SYRINGE FOR IV PUSH (FOR BLOOD PRESSURE SUPPORT)
PREFILLED_SYRINGE | INTRAVENOUS | Status: AC
  Filled 2013-08-04: qty 10

## 2013-08-04 MED ORDER — SODIUM CHLORIDE 0.9 % IV SOLN
30.0000 mmol | Freq: Once | INTRAVENOUS | Status: AC
  Administered 2013-08-04: 30 mmol via INTRAVENOUS
  Filled 2013-08-04: qty 10

## 2013-08-04 MED ORDER — ONDANSETRON HCL 4 MG/2ML IJ SOLN
INTRAMUSCULAR | Status: AC
  Filled 2013-08-04: qty 2

## 2013-08-04 MED ORDER — CALCIUM CHLORIDE 10 % IV SOLN
INTRAVENOUS | Status: DC | PRN
  Administered 2013-08-04 (×2): 200 mg via INTRAVENOUS

## 2013-08-04 MED ORDER — LIDOCAINE HCL (CARDIAC) 20 MG/ML IV SOLN
INTRAVENOUS | Status: AC
  Filled 2013-08-04: qty 5

## 2013-08-04 MED ORDER — ETOMIDATE 2 MG/ML IV SOLN
INTRAVENOUS | Status: AC
  Filled 2013-08-04: qty 10

## 2013-08-04 MED ORDER — MINERAL OIL LIGHT 100 % EX OIL
TOPICAL_OIL | CUTANEOUS | Status: AC
  Filled 2013-08-04: qty 25

## 2013-08-04 MED ORDER — VANCOMYCIN HCL 1000 MG IV SOLR
INTRAVENOUS | Status: DC | PRN
  Administered 2013-08-04 (×3): 1000 mg

## 2013-08-04 MED ORDER — VANCOMYCIN HCL 1000 MG IV SOLR
INTRAVENOUS | Status: AC
  Filled 2013-08-04: qty 4000

## 2013-08-04 MED ORDER — TOBRAMYCIN SULFATE 1.2 G IJ SOLR
INTRAMUSCULAR | Status: AC
  Filled 2013-08-04: qty 3.6

## 2013-08-04 MED ORDER — ROCURONIUM BROMIDE 100 MG/10ML IV SOLN
INTRAVENOUS | Status: DC | PRN
  Administered 2013-08-04: 40 mg via INTRAVENOUS

## 2013-08-04 MED ORDER — SODIUM CHLORIDE 0.9 % IR SOLN
Status: DC | PRN
  Administered 2013-08-04 (×5): 1000 mL

## 2013-08-04 MED ORDER — SODIUM CHLORIDE 0.9 % IR SOLN
Status: DC | PRN
  Administered 2013-08-04: 1000 mL

## 2013-08-04 MED ORDER — CALCIUM CHLORIDE 10 % IV SOLN
INTRAVENOUS | Status: AC
  Filled 2013-08-04: qty 10

## 2013-08-04 MED ORDER — GLYCOPYRROLATE 0.2 MG/ML IJ SOLN
INTRAMUSCULAR | Status: AC
Start: 2013-08-04 — End: 2013-08-04
  Filled 2013-08-04: qty 2

## 2013-08-04 MED ORDER — PROPOFOL 10 MG/ML IV BOLUS
INTRAVENOUS | Status: AC
  Filled 2013-08-04: qty 20

## 2013-08-04 SURGICAL SUPPLY — 75 items
APL SKNCLS STERI-STRIP NONHPOA (GAUZE/BANDAGES/DRESSINGS) ×1
BENZOIN TINCTURE PRP APPL 2/3 (GAUZE/BANDAGES/DRESSINGS) ×3 IMPLANT
BLADE SAW SAG 73X25 THK (BLADE) ×2
BLADE SAW SGTL 73X25 THK (BLADE) ×1 IMPLANT
BRUSH FEMORAL CANAL (MISCELLANEOUS) IMPLANT
CEMENT BONE DEPUY (Cement) ×6 IMPLANT
CLOSURE WOUND 1/2 X4 (GAUZE/BANDAGES/DRESSINGS) ×2
COVER BACK TABLE 24X17X13 BIG (DRAPES) IMPLANT
DRAIN CHANNEL 10F 3/8 F FF (DRAIN) ×2 IMPLANT
DRAPE INCISE IOBAN 66X45 STRL (DRAPES) IMPLANT
DRAPE INCISE IOBAN 85X60 (DRAPES) ×3 IMPLANT
DRAPE ORTHO SPLIT 77X108 STRL (DRAPES) ×6
DRAPE SURG ORHT 6 SPLT 77X108 (DRAPES) ×2 IMPLANT
DRAPE U-SHAPE 47X51 STRL (DRAPES) ×3 IMPLANT
DRILL BIT 7/64X5 (BIT) ×3 IMPLANT
DRSG MEPILEX BORDER 4X12 (GAUZE/BANDAGES/DRESSINGS) ×3 IMPLANT
DRSG MEPILEX BORDER 4X8 (GAUZE/BANDAGES/DRESSINGS) ×3 IMPLANT
DURAPREP 26ML APPLICATOR (WOUND CARE) ×3 IMPLANT
ELECT BLADE 4.0 EZ CLEAN MEGAD (MISCELLANEOUS) ×3
ELECT REM PT RETURN 9FT ADLT (ELECTROSURGICAL) ×3
ELECTRODE BLDE 4.0 EZ CLN MEGD (MISCELLANEOUS) ×1 IMPLANT
ELECTRODE REM PT RTRN 9FT ADLT (ELECTROSURGICAL) ×1 IMPLANT
EVACUATOR 1/8 PVC DRAIN (DRAIN) IMPLANT
FACESHIELD WRAPAROUND (MASK) ×6 IMPLANT
FACESHIELD WRAPAROUND OR TEAM (MASK) ×2 IMPLANT
FLOSEAL 10ML (HEMOSTASIS) ×3 IMPLANT
GLOVE BIOGEL PI IND STRL 7.5 (GLOVE) ×1 IMPLANT
GLOVE BIOGEL PI IND STRL 8 (GLOVE) ×2 IMPLANT
GLOVE BIOGEL PI INDICATOR 7.5 (GLOVE) ×2
GLOVE BIOGEL PI INDICATOR 8 (GLOVE) ×4
GLOVE ECLIPSE 8.0 STRL XLNG CF (GLOVE) ×5 IMPLANT
GLOVE ORTHO TXT STRL SZ7.5 (GLOVE) ×5 IMPLANT
GLOVE SURG ORTHO 8.0 STRL STRW (GLOVE) ×3 IMPLANT
GOWN STRL REIN 3XL XLG LVL4 (GOWN DISPOSABLE) ×3 IMPLANT
GOWN STRL REUS W/ TWL LRG LVL3 (GOWN DISPOSABLE) ×1 IMPLANT
GOWN STRL REUS W/TWL LRG LVL3 (GOWN DISPOSABLE) ×3
HANDPIECE INTERPULSE COAX TIP (DISPOSABLE)
HEAD FEM STD 32X+1 STRL (Hips) ×2 IMPLANT
IMMOBILIZER KNEE 20 (SOFTGOODS) IMPLANT
IMMOBILIZER KNEE 22 UNIV (SOFTGOODS) IMPLANT
IMMOBILIZER KNEE 24 THIGH 36 (MISCELLANEOUS) IMPLANT
IMMOBILIZER KNEE 24 UNIV (MISCELLANEOUS)
KIT BASIN OR (CUSTOM PROCEDURE TRAY) ×3 IMPLANT
KIT ROOM TURNOVER OR (KITS) ×3 IMPLANT
LINER ACET CUP 42MMX32MM (Hips) ×2 IMPLANT
MANIFOLD NEPTUNE II (INSTRUMENTS) ×3 IMPLANT
NS IRRIG 1000ML POUR BTL (IV SOLUTION) ×3 IMPLANT
PACK TOTAL JOINT (CUSTOM PROCEDURE TRAY) ×3 IMPLANT
PAD ARMBOARD 7.5X6 YLW CONV (MISCELLANEOUS) ×6 IMPLANT
PRESSURIZER FEMORAL UNIV (MISCELLANEOUS) IMPLANT
SET HNDPC FAN SPRY TIP SCT (DISPOSABLE) IMPLANT
SPECIMEN JAR MEDIUM (MISCELLANEOUS) ×2 IMPLANT
SPONGE LAP 18X18 X RAY DECT (DISPOSABLE) ×3 IMPLANT
SPONGE LAP 4X18 X RAY DECT (DISPOSABLE) ×3 IMPLANT
STAPLER VISISTAT 35W (STAPLE) IMPLANT
STEM HIGH OFFSET SIZE 1 (Stem) ×2 IMPLANT
STRIP CLOSURE SKIN 1/2X4 (GAUZE/BANDAGES/DRESSINGS) ×4 IMPLANT
SUCTION FRAZIER TIP 10 FR DISP (SUCTIONS) ×3 IMPLANT
SUT ETHIBOND NAB CT1 #1 30IN (SUTURE) ×3 IMPLANT
SUT MNCRL AB 3-0 PS2 18 (SUTURE) ×3 IMPLANT
SUT PDS AB 1 CT  36 (SUTURE) ×4
SUT PDS AB 1 CT 36 (SUTURE) IMPLANT
SUT VIC AB 0 CT1 27 (SUTURE) ×6
SUT VIC AB 0 CT1 27XBRD ANBCTR (SUTURE) ×2 IMPLANT
SUT VIC AB 1 CT1 27 (SUTURE) ×6
SUT VIC AB 1 CT1 27XBRD ANBCTR (SUTURE) ×2 IMPLANT
SUT VIC AB 2-0 CT1 27 (SUTURE) ×6
SUT VIC AB 2-0 CT1 TAPERPNT 27 (SUTURE) ×1 IMPLANT
SWAB COLLECTION DEVICE MRSA (MISCELLANEOUS) ×4 IMPLANT
TOWEL OR 17X24 6PK STRL BLUE (TOWEL DISPOSABLE) ×3 IMPLANT
TOWEL OR 17X26 10 PK STRL BLUE (TOWEL DISPOSABLE) ×3 IMPLANT
TOWER CARTRIDGE SMART MIX (DISPOSABLE) IMPLANT
TRAY FOLEY CATH 16FRSI W/METER (SET/KITS/TRAYS/PACK) IMPLANT
TUBE ANAEROBIC SPECIMEN COL (MISCELLANEOUS) ×2 IMPLANT
WATER STERILE IRR 1000ML POUR (IV SOLUTION) ×9 IMPLANT

## 2013-08-04 NOTE — Anesthesia Preprocedure Evaluation (Signed)
Anesthesia Evaluation  Patient identified by MRN, date of birth, ID band Patient awake    Reviewed: Allergy & Precautions, H&P , NPO status , Patient's Chart, lab work & pertinent test results, reviewed documented beta blocker date and time   Airway Mallampati: II TM Distance: >3 FB Neck ROM: full    Dental   Pulmonary Current Smoker,  breath sounds clear to auscultation        Cardiovascular + Peripheral Vascular Disease negative cardio ROS  Rhythm:regular     Neuro/Psych negative neurological ROS  negative psych ROS   GI/Hepatic negative GI ROS, Neg liver ROS,   Endo/Other  negative endocrine ROS  Renal/GU negative Renal ROS  negative genitourinary   Musculoskeletal   Abdominal   Peds  Hematology  (+) anemia , HIV,   Anesthesia Other Findings See surgeon's H&P   Reproductive/Obstetrics negative OB ROS                           Anesthesia Physical Anesthesia Plan  ASA: IV  Anesthesia Plan: General   Post-op Pain Management:    Induction: Intravenous  Airway Management Planned: Oral ETT  Additional Equipment: Arterial line  Intra-op Plan:   Post-operative Plan: Possible Post-op intubation/ventilation  Informed Consent: I have reviewed the patients History and Physical, chart, labs and discussed the procedure including the risks, benefits and alternatives for the proposed anesthesia with the patient or authorized representative who has indicated his/her understanding and acceptance.   Dental Advisory Given  Plan Discussed with: CRNA and Surgeon  Anesthesia Plan Comments:         Anesthesia Quick Evaluation

## 2013-08-04 NOTE — Transfer of Care (Signed)
Immediate Anesthesia Transfer of Care Note  Patient: [name] [name]  Procedure(s) Performed: Procedure(s): RESECTION HIP JOINT - PLACEMENT OF CEMENT PROSTHESIS (Right)  Patient Location: PACU  Anesthesia Type:General  Level of Consciousness: awake  Airway & Oxygen Therapy: Patient Spontanous Breathing and Patient connected to nasal cannula oxygen  Post-op Assessment: Report given to PACU RN and Post -op Vital signs reviewed and stable  Post vital signs: Reviewed and stable  Complications: No apparent anesthesia complications

## 2013-08-04 NOTE — Progress Notes (Signed)
[facility] ADULT ICU REPLACEMENT PROTOCOL FOR AM LAB REPLACEMENT ONLY  The patient does apply for the [facility] Adult ICU Electrolyte Replacment Protocol based on the criteria listed below:   1. Is GFR >/= 40 ml/min? Yes.    Patient's GFR today is >90 2. Is urine output >/= 0.5 ml/kg/hr for the last 6 hours? Yes.   Patient's UOP is 1.5 ml/kg/hr 3. Is BUN < 60 mg/dL? Yes.    Patient's BUN today is 6 4. Abnormal electrolyte(s): K 3.1, M 1.3, P 1.5 5. Ordered repletion with: per protocol 6. If a panic level lab has been reported, has the CCM MD in charge been notified? Yes.  .   Physician:  Dr Titus Mould.   [facility] Elizabeth Haff A 08/04/2013 6:26 AM

## 2013-08-04 NOTE — Progress Notes (Signed)
Patient ID: [name] [name], male   DOB: 10/07/1960, 52 y.o.   MRN: [id number]         [facility]    Date of Admission:  07/30/2013           Day 5 piperacillin tazoactam  Principal Problem:   Septic arthritis of hip Active Problems:   HIV DISEASE   ANEMIA, CHRONIC   Decubitus ulcer of sacral region, stage 4   Protein-calorie malnutrition, severe   Septic shock(785.52)   . [facility] HOLD] antiseptic oral rinse  15 mL Mouth Rinse q12n4p  . [facility] HOLD] atazanavir  300 mg Oral Q breakfast  . [MAR HOLD] chlorhexidine  15 mL Mouth Rinse BID  . [facility] HOLD] collagenase   Topical Daily  . [[facility] HOLD] feeding supplement (ENSURE COMPLETE)  237 mL Oral BID BM  . [facility] HOLD] lamiVUDine-zidovudine  1 tablet Oral BID  . [[facility] HOLD] nystatin  5 mL Oral QID  . [[facility] HOLD] piperacillin-tazobactam (ZOSYN)  IV  3.375 g Intravenous 3 times per day  . [facility] HOLD] ritonavir  100 mg Oral Q breakfast  . [MAR HOLD] sulfamethoxazole-trimethoprim  1 tablet Oral Daily  . [facility] HOLD] tenofovir  300 mg Oral Daily    Objective: Temp:  [97.5 F (36.4 C)-98.5 F (36.9 C)] 97.5 F (36.4 C) (07/27 1200) Pulse Rate:  [50-82] 67 (07/27 1400) Resp:  [9-23] 14 (07/27 1400) BP: (88-140)/(53-84) 115/71 mmHg (07/27 1400) SpO2:  [93 %-100 %] 100 % (07/27 1400)  Lab Results Lab Results  Component Value Date   WBC 8.6 08/04/2013   HGB 10.5* 08/04/2013   HCT 31.2* 08/04/2013   MCV 94.5 08/04/2013   PLT 145* 08/04/2013    Lab Results  Component Value Date   CREATININE 0.52 08/04/2013   BUN 6 08/04/2013   NA 138 08/04/2013   K 3.1* 08/04/2013   CL 101 08/04/2013   CO2 24 08/04/2013   HIV 1 RNA Quant (copies/mL)  Date Value  07/31/2013 237*  03/28/2013 252*  01/13/2013 29*     CD4 T Cell Abs (/uL)  Date Value  07/31/2013 140*  03/28/2013 230*  01/13/2013 200*     Microbiology: Recent Results (from the past 240 hour(s))  CULTURE, BLOOD (ROUTINE X 2)     Status: None   Collection Time   07/30/13 11:35 PM      Result Value Ref Range Status   Specimen Description BLOOD RIGHT ARM   Final   Special Requests BOTTLES DRAWN AEROBIC AND ANAEROBIC 5CC   Final   Culture  Setup Time     Final   Value: 07/31/2013 08:53     Performed at [facility]   Culture     Final   Value:        BLOOD CULTURE RECEIVED NO GROWTH TO DATE CULTURE WILL BE HELD FOR 5 DAYS BEFORE ISSUING A FINAL NEGATIVE REPORT     Performed at [facility]   Report Status PENDING   Incomplete  CULTURE, BLOOD (ROUTINE X 2)     Status: None   Collection Time    07/31/13 12:25 AM      Result Value Ref Range Status   Specimen Description BLOOD LEFT ARM   Final   Special Requests BOTTLES DRAWN AEROBIC AND ANAEROBIC [facility]   Final   Culture  Setup Time     Final   Value: 07/31/2013 08:53     Performed at [facility]  Culture     Final   Value:        BLOOD CULTURE RECEIVED NO GROWTH TO DATE CULTURE WILL BE HELD FOR 5 DAYS BEFORE ISSUING A FINAL NEGATIVE REPORT     Performed at [facility]   Report Status PENDING   Incomplete  MRSA PCR SCREENING     Status: None   Collection Time    07/31/13  3:47 AM      Result Value Ref Range Status   MRSA by PCR NEGATIVE  NEGATIVE Final   Comment:            The GeneXpert MRSA Assay (FDA     approved for NASAL specimens     only), is one component of a     comprehensive MRSA colonization     surveillance program. It is not     intended to diagnose MRSA     infection nor to guide or     monitor treatment for     MRSA infections.  URINE CULTURE     Status: None   Collection Time    07/31/13  6:29 AM      Result Value Ref Range Status   Specimen Description URINE, RANDOM   Final   Special Requests Immunocompromised   Final   Culture  Setup Time     Final   Value: 07/31/2013 13:32     Performed at [facility]     Final   Value: NO GROWTH     Performed at [facility]   Culture     Final   Value: NO  GROWTH     Performed at [facility]   Report Status 08/01/2013 FINAL   Final  CULTURE, ROUTINE-ABSCESS     Status: None   Collection Time    07/31/13  1:36 PM      Result Value Ref Range Status   Specimen Description ABSCESS RIGHT HIP   Final   Special Requests POF ZOSYN AND VANCOMYCIN   Final   Gram Stain     Final   Value: ABUNDANT WBC PRESENT,BOTH PMN AND MONONUCLEAR     NO SQUAMOUS EPITHELIAL CELLS SEEN     MODERATE GRAM POSITIVE COCCI     IN PAIRS IN CHAINS     Performed at [facility]   Culture     Final   Value: MODERATE GROUP B STREP(S.AGALACTIAE)ISOLATED     Note: TESTING AGAINST S. AGALACTIAE NOT ROUTINELY PERFORMED DUE TO PREDICTABILITY OF AMP/PEN/VAN SUSCEPTIBILITY.     Performed at [facility]   Report Status 08/03/2013 FINAL   Final  ANAEROBIC CULTURE     Status: None   Collection Time    07/31/13  1:36 PM      Result Value Ref Range Status   Specimen Description ABSCESS RIGHT HIP   Final   Special Requests POF ZOSYN AND VANCOMYCIN   Final   Gram Stain     Final   Value: ABUNDANT WBC PRESENT,BOTH PMN AND MONONUCLEAR     NO SQUAMOUS EPITHELIAL CELLS SEEN     MODERATE GRAM POSITIVE COCCI     IN PAIRS IN CHAINS     Performed at [facility]   Culture     Final   Value: NO ANAEROBES ISOLATED; CULTURE IN PROGRESS FOR 5 DAYS     Performed at [facility]   Report Status PENDING   Incomplete  Assessment: He is currently in the OR. I will continue piperacillin tazobactam pending the results of today's surgery rather than narrow down and focus treatment only on group B strep.  His HIV infection is not optimally suppressed.  Plan: 1. Continue piperacillin tazobactam 2. Continue current antiretroviral regimen 3. Weight results of HIV genotype resistance assay  Michel Bickers, MD [facility] for [place] [phone] pager   [phone] cell 08/04/2013, 3:36 PM

## 2013-08-04 NOTE — H&P (View-Only) (Signed)
Patient ID: DANISH RUFFINS, male   DOB: [birth year], 53 y.o.   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  He will be placed on my OR schedule for tomorrow for hip girdlestone (ie resection of right hip joint) and placement of temporary cemented prosthesis with goal of attempting to deliver more antibiotic and source.  NPO Consent ordered  Risks and complexity of problem reviewed with him including fact that he may need to be transferred to [place] for definitive treatment

## 2013-08-04 NOTE — Progress Notes (Signed)
PULMONARY / CRITICAL CARE MEDICINE   Name: [name] [name] MRN: [id number] DOB: 1960-12-09    ADMISSION DATE:  07/30/2013 CONSULTATION DATE:  08/04/2013   REFERRING MD :  Kristine Garbe   CHIEF COMPLAINT:  Low BP  INITIAL PRESENTATION: 53 year old, HIV positive admitted 7/22 with septic arthritis of right hip and abscess extending into the pelvis, underwent I&D and drainage on 7/23 followed by septic shock, hypotensive in spite of full resuscitation. 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 There is probable early osteomyelitis of the femoral head.  Associated infected iliopsoas bursa with superior extension of a  large abscess into the pelvis between the iliacus muscle and the right iliac bone.Decubitus ulcer over the lower sacrum and coccyx.  SIGNIFICANT EVENTS: 7/23 - Admit 7/23 >> Right hip/pelvis, limited I and D and placement of close suction drainage intra and extra pelvic areas [name]) 7/24 - Hypovolemic shock, s/p 4 L NS bolus, 2u PRBC, pressors 7/25 - ID consulted 7/26 - Ortho discussed with Dr. [name] at [facility] (may need to be transferred) 7/27 - Plan to return to OR today for repeat I&D and placement of temporary R-hip prosthesis  ANTIBIOTICS: Ceftriaxone 7/23 >>> 7/23 Vancomycin 7/23 >>> Zosyn 7/23 >>> Bactrim DS HAART  CULTURES: Blood 7/23 >>> Urine 7/23 >>> negative Wound 7/23 >>> mod group B strep, no anaerobes  SUBJECTIVE: Per RN, no longer on pressors (dopamine), planned to go to OR today. Patient feels overall better, complains of persistent R-hip pain, difficulty finding comfortable position.  VITAL SIGNS: Temp:  [97.9 F (36.6 C)-98.5 F (36.9 C)] 98.5 F (36.9 C) (07/27 0400) Pulse Rate:  [51-82] 76 (07/27 0700) Resp:  [11-23] 18 (07/27 0700) BP: (98-146)/(58-91) 119/74 mmHg (07/27 0700) SpO2:  [93 %-100 %] 97 % (07/27  0700)  HEMODYNAMICS: CVP:  [3 mmHg] 3 mmHg  VENTILATOR SETTINGS:   INTAKE / OUTPUT:  Intake/Output Summary (Last 24 hours) at 08/04/13 0737 Last data filed at 08/04/13 0644  Gross per 24 hour  Intake 3309.04 ml  Output   6813 ml  Net -3503.96 ml    PHYSICAL EXAMINATION: Gen - discomfort d/t R-hip, NAD Lungs: CTAB, nml effort Cardiovascular: RRR, no murmurs Abdomen: soft and non-tender, midline scar ,no hepatosplenomegaly, BS nml Musculoskeletal: No deformities, no cyanosis or clubbing, R hip/groind dressing c/d with mult JP drains  Ext: no edema Neuro:  Awake, alert, non focal Skin:  Warm, no lesions/ rash, good cap refill, sacral decub -unstagable (at least Stage 4) 6cm x 5cm   LABS:  CBC  Recent Labs Lab 08/02/13 0635 08/03/13 0634 08/04/13 0430  WBC 13.4* 10.2 8.6  HGB 9.7* 11.0* 10.5*  HCT 29.0* 32.1* 31.2*  PLT 149* 152 145*   Coag's  Recent Labs Lab 07/31/13 0530  INR 1.62*   BMET  Recent Labs Lab 08/02/13 0635 08/03/13 0439 08/04/13 0430  NA 138 137 138  K 3.3* 3.4* 3.1*  CL 108 103 101  CO2 19 23 24   BUN 13 9 6   CREATININE 0.57 0.52 0.52  GLUCOSE 90 101* 90   Electrolytes  Recent Labs Lab 08/02/13 0635 08/03/13 0439 08/04/13 0430  CALCIUM 7.1* 7.5* 7.5*  MG  --   --  1.3*  PHOS  --   --  1.5*   Sepsis Markers  Recent Labs Lab 07/30/13 2342 08/01/13 1505 08/02/13 1207  LATICACIDVEN 1.58 0.8 1.0  ABG  Recent Labs Lab 08/01/13 1641  PHART 7.305*  PCO2ART 33.9*  PO2ART 76.0*   Liver Enzymes  Recent Labs Lab 07/30/13 2335  AST 34  ALT 21  ALKPHOS 97  BILITOT 0.7  ALBUMIN 2.0*   Cardiac Enzymes  Recent Labs Lab 08/02/13 1206 08/02/13 1810 08/03/13 0001  TROPONINI <0.30 <0.30 <0.30   Glucose  Recent Labs Lab 08/01/13 1143 08/01/13 1445 08/01/13 2001 08/02/13 1951  GLUCAP 92 107* 111* 110*    Imaging No results found.   ASSESSMENT / PLAN:  PULMONARY A: No acute issues Tobacco  abuse P:   Smoking cessation  supplemental O2  CARDIOVASCULAR  A: Septic shock - Resolved, s/p aggressive IVF resuscitation, pressors (off) Transient sinus bradycardia P:  IVF NS @ 125 cc/hr Off pressors, previously on dopamine given relative bradycardia   RENAL A: Hypokalemia - 3.1  P:   Monitor BMET, replace electrolytes Monitor urine output F/u chem - at risk AKI with hypotension   GI A:   No acute issues P:   NPO for OR today 7/27 Will resume regular diet as tolerated post-op  HEMATOLOGIC A:  Acute blood loss anemia - low 6.1 >> s/p 2u PRBC. Currently Hgb 10.5 Thrombocytopenia P:  Transfuse to for hgb <7 Monitor platelets  INFECTIOUS A: Septic arthritis - R iliopsoas abscess (Group B Strep) Osteomyelitis R hip Pelvic abscess Sacral decubitus HIV P:   Return to OR today 7/27 for repeat I&D and placement temporary prosthesis ID following Ortho following - contacted [name] (Dr. [name] 7/26), consider future possibility of transfer Monitor drains in R-Hip Antibiotics and cultures as above  ENDOCRINE A: No issues   P:   F/u glucose on chem   NEUROLOGIC A:  No issues P:   PRN pain rx  PT/OT   [name], [name], PGY-2  TODAY'S SUMMARY:  To return to OR today 7/27 for repeat I&D and placement temporary R-hip prosthesis. ID following, continue antibiotics and monitor cultures.  Continue pressors for now, will evaluate respiratory status post op.  CC time 35 min.  Rush Farmer, M.D. [facility] Pulmonary/Critical Care Medicine. Pager: [phone]. After hours pager: [phone].  08/04/2013  7:37 AM

## 2013-08-04 NOTE — Interval H&P Note (Signed)
History and Physical Interval Note:  08/04/2013 3:52 PM  [name] [name]  has presented today for surgery, with the diagnosis of Infected Right Hip  The various methods of treatment have been discussed with the patient and family. After consideration of risks, benefits and other options for treatment, the patient has consented to  Procedure(s): RESECTION HIP JOINT - PLACEMENT OF CEMENT PROSTHESIS (Right) as a surgical intervention .  The patient's history has been reviewed, patient examined, no change in status, stable for surgery.  I have reviewed the patient's chart and labs.  Questions were answered to the patient's satisfaction.     [name]

## 2013-08-04 NOTE — Brief Op Note (Signed)
07/30/2013 - 08/04/2013  4:10 PM  PATIENT:  [name] [name]  53 y.o. male  PRE-OPERATIVE DIAGNOSIS:   Septic right hip joint with tracking of infection up iliopsoas tendon sheath  POST-OPERATIVE DIAGNOSIS:  Septic right hip joint with tracking of infection up iliopsoas tendon sheath  PROCEDURE:  Procedure(s): RESECTION HIP JOINT - PLACEMENT OF CEMENT PROSTHESIS (Right)  SURGEON:  Surgeon(s) and Role:    * [name], MD - Primary  PHYSICIAN ASSISTANT: Danae Orleans, PA-C  ANESTHESIA:   general  EBL:  Total I/O In: 663.2 [I.V.:217.7; IV Piggyback:445.5] Out: 900 [Urine:900]  BLOOD ADMINISTERED:none  DRAINS: (1 medium) Hemovact drain(s) in the right hip joint with  Suction Open   LOCAL MEDICATIONS USED:  NONE  SPECIMEN:  Source of Specimen:  rigth hip joint fluid  DISPOSITION OF SPECIMEN:  PATHOLOGY  COUNTS:  YES  TOURNIQUET:  * No tourniquets in log *  DICTATION: .Other Dictation: Dictation Number [id number]  PLAN OF CARE: Admit to inpatient   PATIENT DISPOSITION:  PACU - hemodynamically stable.   Delay start of Pharmacological VTE agent (>24hrs) due to surgical blood loss or risk of bleeding: no

## 2013-08-05 ENCOUNTER — Inpatient Hospital Stay (HOSPITAL_COMMUNITY): Payer: No Typology Code available for payment source

## 2013-08-05 ENCOUNTER — Encounter (HOSPITAL_COMMUNITY): Payer: Self-pay | Admitting: Orthopedic Surgery

## 2013-08-05 DIAGNOSIS — B20 Human immunodeficiency virus [HIV] disease: Secondary | ICD-10-CM

## 2013-08-05 LAB — BASIC METABOLIC PANEL
Anion gap: 11 (ref 5–15)
BUN: 12 mg/dL (ref 6–23)
CALCIUM: 7.3 mg/dL — AB (ref 8.4–10.5)
CO2: 21 mEq/L (ref 19–32)
CREATININE: 0.85 mg/dL (ref 0.50–1.35)
Chloride: 105 mEq/L (ref 96–112)
GFR calc Af Amer: >90 mL/min (ref >90)
GFR calc non Af Amer: >90 mL/min (ref >90)
GLUCOSE: 86 mg/dL (ref 70–99)
Potassium: 3.5 mEq/L — ABNORMAL LOW (ref 3.7–5.3)
SODIUM: 137 meq/L (ref 137–147)

## 2013-08-05 LAB — CBC
HEMATOCRIT: 25.3 % — AB (ref 39.0–52.0)
HEMOGLOBIN: 8.2 g/dL — AB (ref 13.0–17.0)
MCH: 32.7 pg (ref 26.0–34.0)
MCHC: 33.6 g/dL (ref 30.0–36.0)
MCV: 97.3 fL (ref 78.0–100.0)
Platelets: 136 10*3/uL — ABNORMAL LOW (ref 150–400)
RBC: 2.6 MIL/uL — AB (ref 4.22–5.81)
RDW: 16.9 % — ABNORMAL HIGH (ref 11.5–15.5)
WBC: 6.9 10*3/uL (ref 4.0–10.5)

## 2013-08-05 LAB — BLOOD GAS, ARTERIAL
Acid-base deficit: 2.6 mmol/L — ABNORMAL HIGH (ref 0.0–2.0)
Bicarbonate: 21.7 mEq/L (ref 20.0–24.0)
DRAWN BY: 36277
O2 Content: 2 L/min
O2 Saturation: 98.8 %
PCO2 ART: 36.6 mmHg (ref 35.0–45.0)
PH ART: 7.387 (ref 7.350–7.450)
Patient temperature: 97.6
TCO2: 22.8 mmol/L (ref 0–100)
pO2, Arterial: 149 mmHg — ABNORMAL HIGH (ref 80.0–100.0)

## 2013-08-05 LAB — URINALYSIS, ROUTINE W REFLEX MICROSCOPIC
Bilirubin Urine: NEGATIVE
GLUCOSE, UA: NEGATIVE mg/dL
Ketones, ur: 15 mg/dL — AB
Nitrite: NEGATIVE
PH: 7 (ref 5.0–8.0)
Protein, ur: NEGATIVE mg/dL
Specific Gravity, Urine: 1.012 (ref 1.005–1.030)
Urobilinogen, UA: 1 mg/dL (ref 0.0–1.0)

## 2013-08-05 LAB — ANAEROBIC CULTURE

## 2013-08-05 LAB — MAGNESIUM: MAGNESIUM: 2.3 mg/dL (ref 1.5–2.5)

## 2013-08-05 LAB — URINE MICROSCOPIC-ADD ON

## 2013-08-05 LAB — PHOSPHORUS: PHOSPHORUS: 3.4 mg/dL (ref 2.3–4.6)

## 2013-08-05 MED ORDER — DOCUSATE SODIUM 100 MG PO CAPS
100.0000 mg | ORAL_CAPSULE | Freq: Two times a day (BID) | ORAL | Status: DC
  Administered 2013-08-05 – 2013-08-13 (×5): 100 mg via ORAL
  Filled 2013-08-05 (×15): qty 1

## 2013-08-05 MED ORDER — POLYETHYLENE GLYCOL 3350 17 G PO PACK
17.0000 g | PACK | Freq: Every day | ORAL | Status: DC | PRN
  Filled 2013-08-05: qty 1

## 2013-08-05 MED ORDER — OXYCODONE HCL 5 MG/5ML PO SOLN: 5.0000 mg | Freq: Once | ORAL | Status: AC | PRN

## 2013-08-05 MED ORDER — METOCLOPRAMIDE HCL 10 MG PO TABS: 5.0000 mg | ORAL_TABLET | Freq: Three times a day (TID) | ORAL | Status: DC | PRN

## 2013-08-05 MED ORDER — METOCLOPRAMIDE HCL 5 MG/ML IJ SOLN: 10.0000 mg | Freq: Once | INTRAMUSCULAR | Status: DC | PRN

## 2013-08-05 MED ORDER — METOCLOPRAMIDE HCL 5 MG/ML IJ SOLN
5.0000 mg | Freq: Three times a day (TID) | INTRAMUSCULAR | Status: DC | PRN
  Filled 2013-08-05: qty 2

## 2013-08-05 MED ORDER — ONDANSETRON HCL 4 MG/2ML IJ SOLN
4.0000 mg | Freq: Four times a day (QID) | INTRAMUSCULAR | Status: DC | PRN
  Administered 2013-08-05 – 2013-08-08 (×5): 4 mg via INTRAVENOUS
  Filled 2013-08-05 (×5): qty 2

## 2013-08-05 MED ORDER — HYDROMORPHONE HCL PF 1 MG/ML IJ SOLN
0.5000 mg | INTRAMUSCULAR | Status: DC | PRN
Start: 2013-08-05 — End: 2013-08-13
  Administered 2013-08-05 – 2013-08-09 (×13): 1 mg via INTRAVENOUS
  Administered 2013-08-09: 0.5 mg via INTRAVENOUS
  Administered 2013-08-10: 1 mg via INTRAVENOUS
  Administered 2013-08-10 – 2013-08-11 (×2): 0.5 mg via INTRAVENOUS
  Administered 2013-08-11 – 2013-08-12 (×4): 1 mg via INTRAVENOUS
  Administered 2013-08-13 (×3): 0.5 mg via INTRAVENOUS
  Filled 2013-08-05 (×24): qty 1

## 2013-08-05 MED ORDER — MIDODRINE HCL 5 MG PO TABS
5.0000 mg | ORAL_TABLET | Freq: Three times a day (TID) | ORAL | Status: DC
  Administered 2013-08-05 – 2013-08-06 (×3): 5 mg via ORAL
  Filled 2013-08-05 (×6): qty 1

## 2013-08-05 MED ORDER — MENTHOL 3 MG MT LOZG
1.0000 | LOZENGE | OROMUCOSAL | Status: DC | PRN
  Filled 2013-08-05: qty 9

## 2013-08-05 MED ORDER — HYDROCODONE-ACETAMINOPHEN 5-325 MG PO TABS
1.0000 | ORAL_TABLET | ORAL | Status: DC | PRN
  Administered 2013-08-05: 1 via ORAL
  Administered 2013-08-06: 2 via ORAL
  Administered 2013-08-06: 1 via ORAL
  Administered 2013-08-06 – 2013-08-08 (×2): 2 via ORAL
  Administered 2013-08-09 (×2): 1 via ORAL
  Administered 2013-08-10 – 2013-08-13 (×8): 2 via ORAL
  Filled 2013-08-05: qty 2
  Filled 2013-08-05 (×2): qty 1
  Filled 2013-08-05 (×2): qty 2
  Filled 2013-08-05: qty 1
  Filled 2013-08-05 (×8): qty 2
  Filled 2013-08-05: qty 1
  Filled 2013-08-05: qty 2

## 2013-08-05 MED ORDER — HYDROCORTISONE NA SUCCINATE PF 100 MG IJ SOLR
50.0000 mg | Freq: Four times a day (QID) | INTRAMUSCULAR | Status: DC
  Administered 2013-08-05 – 2013-08-12 (×29): 50 mg via INTRAVENOUS
  Administered 2013-08-12: 15:00:00 via INTRAVENOUS
  Administered 2013-08-13 (×3): 50 mg via INTRAVENOUS
  Filled 2013-08-05 (×30): qty 1
  Filled 2013-08-05: qty 2
  Filled 2013-08-05 (×5): qty 1
  Filled 2013-08-05: qty 2
  Filled 2013-08-05: qty 1
  Filled 2013-08-05: qty 2
  Filled 2013-08-05 (×2): qty 1

## 2013-08-05 MED ORDER — SODIUM CHLORIDE 0.9 % IV SOLN: INTRAVENOUS | Status: DC

## 2013-08-05 MED ORDER — ENSURE COMPLETE PO LIQD: 237.0000 mL | Freq: Two times a day (BID) | ORAL | Status: DC | PRN

## 2013-08-05 MED ORDER — OXYCODONE HCL 5 MG PO TABS: 5.0000 mg | ORAL_TABLET | Freq: Once | ORAL | Status: AC | PRN

## 2013-08-05 MED ORDER — ENOXAPARIN SODIUM 40 MG/0.4ML ~~LOC~~ SOLN
40.0000 mg | SUBCUTANEOUS | Status: DC
  Administered 2013-08-05 – 2013-08-08 (×4): 40 mg via SUBCUTANEOUS
  Filled 2013-08-05 (×5): qty 0.4

## 2013-08-05 MED ORDER — ONDANSETRON HCL 4 MG PO TABS: 4.0000 mg | ORAL_TABLET | Freq: Four times a day (QID) | ORAL | Status: DC | PRN

## 2013-08-05 MED ORDER — ACETAMINOPHEN 325 MG PO TABS: 650.0000 mg | ORAL_TABLET | Freq: Four times a day (QID) | ORAL | Status: DC | PRN

## 2013-08-05 MED ORDER — HYDROMORPHONE HCL PF 1 MG/ML IJ SOLN: 0.2500 mg | INTRAMUSCULAR | Status: DC | PRN

## 2013-08-05 MED ORDER — PHENOL 1.4 % MT LIQD
1.0000 | OROMUCOSAL | Status: DC | PRN
Start: 2013-08-05 — End: 2013-08-13
  Filled 2013-08-05: qty 177

## 2013-08-05 MED ORDER — BOOST / RESOURCE BREEZE PO LIQD
1.0000 | Freq: Three times a day (TID) | ORAL | Status: DC
  Administered 2013-08-05 – 2013-08-08 (×5): 1 via ORAL

## 2013-08-05 MED ORDER — POTASSIUM CHLORIDE 10 MEQ/50ML IV SOLN
10.0000 meq | INTRAVENOUS | Status: AC
  Administered 2013-08-05 (×3): 10 meq via INTRAVENOUS
  Filled 2013-08-05 (×3): qty 50

## 2013-08-05 MED ORDER — ACETAMINOPHEN 650 MG RE SUPP: 650.0000 mg | Freq: Four times a day (QID) | RECTAL | Status: DC | PRN

## 2013-08-05 NOTE — Evaluation (Addendum)
Physical Therapy Evaluation Patient Details Name: [name] [name] MRN: [id number] DOB: [birth year] Today's Date: 08/05/2013   History of Present Illness    53 year old, HIV positive admitted 7/22 with septic arthritis of right hip and abscess extending into the pelvis, underwent I&D and drainage on 7/23 followed by septic shock, hypotensive in spite of full resuscitation. Hence PCCM consulted 7/24.  Had hip resection on 08/04/13 with cement placement.  Has posterior hip precautions and is PWB 50% right LE.     Clinical Impression  Pt admitted with above. Pt currently with functional limitations due to the deficits listed below (see PT Problem List).  Pt will benefit from skilled PT to increase their independence and safety with mobility to allow discharge to the venue listed below.     Follow Up Recommendations LTACH;Supervision/Assistance - 24 hour    Equipment Recommendations  Other (comment) (tBA)    Recommendations for Other Services       Precautions / Restrictions Precautions Precautions: Posterior Hip;Fall Precaution Booklet Issued: Yes (comment) Restrictions Weight Bearing Restrictions: Yes RLE Weight Bearing: Partial weight bearing RLE Partial Weight Bearing Percentage or Pounds: 50      Mobility  Bed Mobility Overal bed mobility: Needs Assistance;+2 for physical assistance Bed Mobility: Supine to Sit     Supine to sit: Mod assist;+2 for physical assistance;HOB elevated     General bed mobility comments: Pt needed assist for right LE with pt having right LE pain as well as for elevation of trunk.  Took incr time and need for cues for hip precautions.  Used pad to assist with getting pt to EOB.    Transfers                    Ambulation/Gait                Stairs            Wheelchair Mobility    Modified Rankin (Stroke Patients Only)       Balance Overall balance assessment: Needs assistance;History of Falls Sitting-balance support:  Bilateral upper extremity supported;Feet supported Sitting balance-Leahy Scale: Fair Sitting balance - Comments: Pt was able to sit EOB 10 minutes total with initial mod assist progressing to min guard asssit after some time.  BP soft.  Pt on pressors still and not symptomatic but BP low therefore layed pt back in bed and positioned comfortably.   Postural control: Posterior lean                                   Pertinent Vitals/Pain BP on arrival was 82/49.  BP at EOB 79/50 with pt asymptomatic.  BP at end of evaluation 90/52.  HR 68-120 bpm with activity.  10/10 right hip pain per pt with pt premedicated.     Home Living Family/patient expects to be discharged to:: Private residence Living Arrangements: Other relatives;Non-relatives/Friends Available Help at Discharge: Friend(s);Family;Available 24 hours/day (sisters and a roommate) Type of Home: House Home Access: Stairs to enter Entrance Stairs-Rails: Left Entrance Stairs-Number of Steps: 3 Home Layout: One level Home Equipment: Wheelchair - [employer] - 2 wheels (walk in shower) Additional Comments: Mom is in [place] [facility]    Prior Function Level of Independence: Independent         Comments: Drives, worked up until illness     Journalist, newspaper        Extremity/Trunk Assessment   Upper  Extremity Assessment: Defer to OT evaluation           Lower Extremity Assessment: RLE deficits/detail      Cervical / Trunk Assessment: Normal  Communication   Communication: No difficulties  Cognition Arousal/Alertness: Awake/alert Behavior During Therapy: WFL for tasks assessed/performed Overall Cognitive Status: Within Functional Limits for tasks assessed                      General Comments General comments (skin integrity, edema, etc.): Has sacral ulcer.    Exercises General Exercises - Lower Extremity Ankle Circles/Pumps: AROM;Both;5 reps;Supine Heel Slides: AAROM;Both;5 reps;Supine       Assessment/Plan    PT Assessment Patient needs continued PT services  PT Diagnosis Generalized weakness;Acute pain   PT Problem List Decreased activity tolerance;Decreased balance;Decreased mobility;Decreased knowledge of use of DME;Decreased safety awareness;Decreased knowledge of precautions;Pain;Decreased strength;Decreased range of motion;Decreased cognition  PT Treatment Interventions DME instruction;Gait training;Functional mobility training;Therapeutic activities;Therapeutic exercise;Balance training;Patient/family education   PT Goals (Current goals can be found in the Care Plan section) Acute Rehab PT Goals Patient Stated Goal: to go home PT Goal Formulation: With patient Time For Goal Achievement: 08/19/13 Potential to Achieve Goals: Good    Frequency Min 3X/week   Barriers to discharge Decreased caregiver support      Co-evaluation PT/OT/SLP Co-Evaluation/Treatment: Yes Reason for Co-Treatment: Complexity of the patient's impairments (multi-system involvement) PT goals addressed during session: Mobility/safety with mobility         End of Session Equipment Utilized During Treatment: Oxygen Activity Tolerance: Patient limited by fatigue;Patient limited by pain (limited by BP soft) Patient left: in bed;with call bell/phone within reach Nurse Communication: Mobility status;Need for lift equipment;Weight bearing status;Precautions (precautions posted in pt room. )         Time: 0737-1062 PT Time Calculation (min): 32 min   Charges:   PT Evaluation $Initial PT Evaluation Tier I: 1 Procedure PT Treatments $Therapeutic Activity: 8-22 mins   PT G Codes:          [name] [name] 08/08/2013, 12:10 PM [name] [name],PT Acute Rehabilitation [phone] (pager)  Note updated by [name] [name], PT to reflect correct WB status in the History of present illness section.    08-08-2013 [name] [name], PT Pager: [phone]

## 2013-08-05 NOTE — Progress Notes (Signed)
PULMONARY / CRITICAL CARE MEDICINE   Name: [name] [name] MRN: [id number] DOB: February 14, 1960    ADMISSION DATE:  07/30/2013 CONSULTATION DATE:  08/05/2013   REFERRING MD :  Kristine Garbe   CHIEF COMPLAINT:  Hypotension  INITIAL PRESENTATION: 53 year old, HIV positive admitted 7/22 with septic arthritis of right hip and abscess extending into the pelvis, underwent I&D and drainage on 7/23 followed by septic shock, hypotensive in spite of full resuscitation. 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 There is probable early osteomyelitis of the femoral head.  Associated infected iliopsoas bursa with superior extension of a  large abscess into the pelvis between the iliacus muscle and the right iliac bone.Decubitus ulcer over the lower sacrum and coccyx.  SIGNIFICANT EVENTS: 7/23 - Admit 7/23 >> Right hip/pelvis, limited I and D and placement of close suction drainage intra and extra pelvic areas [name]) 7/24 - Hypovolemic shock, s/p 4 L NS bolus, 2u PRBC, pressors 7/25 - ID consulted 7/26 - Ortho discussed with Dr. [name] at [facility] (may need to be transferred) 7/27 - Returned to OR for resection R-hip d/t septic jt with tracking up iliopsoas sheath >> placed temporary prosthseis 7/28 - Remains on Dopamine 10 mcg (intermitt brady), Urinary retention >> foley placed, R-hip vac drain  ANTIBIOTICS: Ceftriaxone 7/23 >>> 7/23 Vancomycin 7/23 >>> 7/27 Zosyn 7/23 >>> Bactrim DS HAART  CULTURES: Blood 7/23 >>> Urine 7/23 >>> negative Wound 7/23 >>> mod group B strep, no anaerobes R-hip synovial fluid 7/27 >>> mod WBC, rare GPC chains >>> NGTD Wound 7/27 >>>  LINES/TUBES: Foley 7/27 >>>  SUBJECTIVE: Per RN, patient remained on Dopamine > 24 hours @ 10 mcg given intermittent bradycardia, tolerated return to OR yesterday s/p placement of temporary prosthesis, appropriate post-op  pain and had urinary retention required foley placement. Today patient with stable R-hip pain, otherwise no complaints.  VITAL SIGNS: Temp:  [96.2 F (35.7 C)-98 F (36.7 C)] 97.6 F (36.4 C) (07/28 0332) Pulse Rate:  [50-106] 57 (07/28 0645) Resp:  [9-29] 16 (07/28 0645) BP: (87-139)/(52-85) 99/57 mmHg (07/28 0600) SpO2:  [93 %-100 %] 100 % (07/28 0645) Arterial Line BP: (84-129)/(45-63) 129/51 mmHg (07/28 0645)  HEMODYNAMICS: CVP:  [4 mmHg] 4 mmHg  VENTILATOR SETTINGS:   INTAKE / OUTPUT:  Intake/Output Summary (Last 24 hours) at 08/05/13 0740 Last data filed at 08/05/13 0600  Gross per 24 hour  Intake 5128.27 ml  Output   2675 ml  Net 2453.27 ml    PHYSICAL EXAMINATION: Gen - cooperative, conversational, discomfort with movement R-hip, NAD Lungs: CTAB, nml effort Cardiovascular: RRR, no murmurs Abdomen: soft and non-tender, midline scar ,no hepatosplenomegaly, BS nml Musculoskeletal: No deformities, no cyanosis or clubbing, R hip/groind dressing c/d vac drainage (+serosang drainage) Ext: no edema Neuro:  Awake, alert, non focal Skin:  Warm, no lesions/ rash, good cap refill, sacral decub -unstagable (at least Stage 4) 6cm x 5cm  LABS:  CBC  Recent Labs Lab 08/03/13 0634 08/04/13 0430 08/05/13 0445  WBC 10.2 8.6 6.9  HGB 11.0* 10.5* 8.2*  HCT 32.1* 31.2* 25.3*  PLT 152 145* 136*   Coag's  Recent Labs Lab 07/31/13 0530  INR 1.62*   BMET  Recent Labs Lab 08/03/13 0439 08/04/13 0430 08/05/13 0445  NA 137 138 137  K 3.4* 3.1* 3.5*  CL 103 101 105  CO2 23 24 21   BUN 9 6 12  CREATININE 0.52 0.52 0.85  GLUCOSE 101* 90 86   Electrolytes  Recent Labs Lab 08/03/13 0439 08/04/13 0430 08/04/13 2000 08/05/13 0445  CALCIUM 7.5* 7.5*  --  7.3*  MG  --  1.3* 2.4 2.3  PHOS  --  1.5* 3.5 3.4   Sepsis Markers  Recent Labs Lab 07/30/13 2342 08/01/13 1505 08/02/13 1207  LATICACIDVEN 1.58 0.8 1.0   ABG  Recent Labs Lab 08/01/13 1641  08/05/13 0350  PHART 7.305* 7.387  PCO2ART 33.9* 36.6  PO2ART 76.0* 149.0*   Liver Enzymes  Recent Labs Lab 07/30/13 2335  AST 34  ALT 21  ALKPHOS 97  BILITOT 0.7  ALBUMIN 2.0*   Cardiac Enzymes  Recent Labs Lab 08/02/13 1206 08/02/13 1810 08/03/13 0001  TROPONINI <0.30 <0.30 <0.30   Glucose  Recent Labs Lab 08/01/13 1143 08/01/13 1445 08/01/13 2001 08/02/13 1951  GLUCAP 92 107* 111* 110*    Imaging No results found.   ASSESSMENT / PLAN:  PULMONARY A: No acute issues Tobacco abuse P:   - Supplemental O2 PRN - Smoking cessation  CARDIOVASCULAR  A: Septic shock - Improving, remains on Dopamine 10 mcg > 24 hrs Transient sinus bradycardia P:  - IVF NS @ 125 >> 50cc when tolerating PO - Remains on pressors with Dopamine given relative bradycardia - Monitor BP, MAP goal >60-65, currently @ 60  RENAL A: Hypokalemia - 3.5 Concern AKI - increased Cr 0.85 from 0.5 P:   - Monitor BMET, replace electrolytes - Monitor urine output, inc to 10L over past 48 hrs, foley in place given post-op urinary retention  GI A:   No acute issues P:   - Advance diet as tolerated (post-op 7/27), clear liquid >> regular  HEMATOLOGIC A:  Acute blood loss anemia, s/p 2u PRBC 7/24 post-op - Dec Hgb 10.5 to 8.2 today Thrombocytopenia, chronic low nml - 136, gradual decrease P:  - Monitor CBC - Transfuse to for hgb <7 - SCDs for VTE prophyalxis, post-op  INFECTIOUS A: Septic arthritis - with extension osteo / abscess - s/p OR 7/23 and 7/27 placed temp R-hip prosthesis 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 Goal to wean Dopamine today. Returned from successfully from [place] 7/27 with repeat I&D and placed temporary R-hip prosthesis (additional cultures obtained). Follow-up further abx narrowing per ID.  Will start stress dose steroids and midodrine today with the hope to get patient off pressors.  CC time 35 min.  Patient seen and examined, agree with above note.  I dictated the care and orders written for this patient under my direction.  Rush Farmer, MD [phone] 08/05/2013  7:40 AM

## 2013-08-05 NOTE — Progress Notes (Signed)
Patient ID: [name] [name], male   DOB: [birth year], 53 y.o.   MRN: [id number] Subjective: 1 Day Post-Op Procedure(s) (LRB): RESECTION HIP JOINT - PLACEMENT OF CEMENT PROSTHESIS (Right)    Patient reports pain as mild.  Slept well last night.  But acknowledges that he hasn't tried to move his hip much  Objective:   VITALS:   Filed Vitals:   08/05/13 0600  BP: 99/57  Pulse: 57  Temp:   Resp: 15    Neurovascular intact Incision: scant drainage  Drain in place  LABS  Recent Labs  08/03/13 0634 08/04/13 0430 08/05/13 0445  HGB 11.0* 10.5* 8.2*  HCT 32.1* 31.2* 25.3*  WBC 10.2 8.6 6.9  PLT 152 145* 136*     Recent Labs  08/03/13 0439 08/04/13 0430 08/05/13 0445  NA 137 138 137  K 3.4* 3.1* 3.5*  BUN 9 6 12   CREATININE 0.52 0.52 0.85  GLUCOSE 101* 90 86    No results found for this basename: LABPT, INR,  in the last 72 hours   Assessment/Plan: 1 Day Post-Op Procedure(s) (LRB): RESECTION HIP JOINT - PLACEMENT OF CEMENT PROSTHESIS (Right)   Up with therapy, PWB RLE Continue ABX therapy due to septicemia related to septic native right hip Continue foley due to patient in ICU  Maintain right hip drain, will probably remove tomorrow depending on output  Post X-ray to eval hip

## 2013-08-05 NOTE — Progress Notes (Signed)
NUTRITION FOLLOW UP  DOCUMENTATION CODES  Per approved criteria   -Severe malnutrition in the context of chronic illness  -Underweight    Intervention:    Change supplement to Resource Breeze PO TID, each supplement provides 250 kcal and 9 grams of protein  Continue Ensure Complete PO prn, each supplement provides 350 kcals, 13 gm protein.  New Nutrition Dx:   Malnutrition related to inadequate oral intake as evidenced by severe depletion of muscle and subcutaneous fat mass.  Goal:   Intake to meet >90% of estimated nutrition needs. Unmet.  Monitor:   PO intake, labs, weight trend.  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 Admitted 7/22 with septic arthritis of right hip and abscess extending into the pelvis, underwent I&D and drainage on 7/23 followed by septic shock, hypotensive in spite of full resuscitation.   Patient with increased nutrition needs to promote wound healing of stage 4 sacral wound. PO intake is improving. Consumed 75% of clear liquid lunch; diet has been advanced to regular for dinner.  Nutrition Focused Physical Exam:  Subcutaneous Fat:  Orbital Region: severe depletion Upper Arm Region: severe depletion Thoracic and Lumbar Region: NA  Muscle:  Temple Region: severe depletion Clavicle Bone Region: severe depletion Clavicle and Acromion Bone Region: severe depletion Scapular Bone Region: NA Dorsal Hand: severe depletion Patellar Region: severe depletion Anterior Thigh Region: severe depletion Posterior Calf Region: severe depletion  Edema: None  Patient reports that he has had a poor appetite due to thrush recently. He drinks Ensure at home, but "it goes right through me." Agreed to try Resource Breeze, no residue, should be easier to tolerate.  Height: Ht Readings from Last 1 Encounters:  08/01/13 6\' 1"  (1.854 m)    Weight  Status:   Wt Readings from Last 1 Encounters:  08/01/13 142 lb 6.7 oz (64.6 kg)  07/31/13  123 lb 0.3 oz (55.8 kg)   Re-estimated needs:  Kcal: 2050-2200 kcal  Protein: 95-110 g  Fluid: >2 L  Skin: unstageable pressure ulcer to sacrum; right hip incision  Diet Order: General   Intake/Output Summary (Last 24 hours) at 08/05/13 1531 Last data filed at 08/05/13 1400  Gross per 24 hour  Intake 5698.26 ml  Output   2475 ml  Net 3223.26 ml    Last BM: 7/27   Labs:   Recent Labs Lab 08/03/13 0439 08/04/13 0430 08/04/13 2000 08/05/13 0445  NA 137 138  --  137  K 3.4* 3.1*  --  3.5*  CL 103 101  --  105  CO2 23 24  --  21  BUN 9 6  --  12  CREATININE 0.52 0.52  --  0.85  CALCIUM 7.5* 7.5*  --  7.3*  MG  --  1.3* 2.4 2.3  PHOS  --  1.5* 3.5 3.4  GLUCOSE 101* 90  --  86    CBG (last 3)   Recent Labs  08/02/13 1951  GLUCAP 110*    Scheduled Meds: . antiseptic oral rinse  15 mL Mouth Rinse q12n4p  . atazanavir  300 mg Oral Q breakfast  . chlorhexidine  15 mL Mouth Rinse BID  . collagenase   Topical Daily  . docusate sodium  100 mg Oral BID  . enoxaparin (LOVENOX) injection  40 mg Subcutaneous Q24H  . feeding supplement (ENSURE COMPLETE)  237 mL  Oral BID BM  . hydrocortisone sodium succinate  50 mg Intravenous Q6H  . lamiVUDine-zidovudine  1 tablet Oral BID  . midodrine  5 mg Oral TID WC  . nystatin  5 mL Oral QID  . piperacillin-tazobactam (ZOSYN)  IV  3.375 g Intravenous 3 times per day  . ritonavir  100 mg Oral Q breakfast  . sulfamethoxazole-trimethoprim  1 tablet Oral Daily  . tenofovir  300 mg Oral Daily    Continuous Infusions: . sodium chloride 125 mL/hr at 08/05/13 0600  . sodium chloride    . DOPamine 5 mcg/kg/min (08/05/13 1427)  . vasopressin (PITRESSIN) infusion - *FOR SHOCK*      Molli Barrows, RD, LDN, [place] Pager [phone] After Hours Pager [phone]

## 2013-08-05 NOTE — Progress Notes (Signed)
[place] Progress Note Patient Name: [name] [name] DOB: Jan 05, 1961 MRN: [id number]  Date of Service  08/05/2013   HPI/Events of Note   Over 600 cc urine twice on [place] scan  eICU Interventions  Needs foley   Intervention Category Intermediate Interventions: Oliguria - evaluation and management  Raylene Miyamoto. 08/05/2013, 1:07 AM

## 2013-08-05 NOTE — Care Management Note (Signed)
    Page 1 of 1   08/05/2013     1:32:49 PM CARE MANAGEMENT NOTE 08/05/2013  Patient:  [name] [name], [name] [name]   Account Number:  [id number]  Date Initiated:  07/31/2013  Documentation initiated by:  Marvetta Gibbons  Subjective/Objective Assessment:   Pt admitted with septic arthritis of hip with osteomyelitis     Action/Plan:   PTA pt lived at home   Anticipated DC Date:     Anticipated DC Plan:        [place]  CM consult      Choice offered to / List presented to:             Status of service:  Completed, signed off Medicare Important Message given?   (If response is "NO", the following Medicare IM given date fields will be blank) Date Medicare IM given:   Medicare IM given by:   Date Additional Medicare IM given:   Additional Medicare IM given by:    Discharge Disposition:    Per UR Regulation:  Reviewed for med. necessity/level of care/duration of stay  If discussed at [place] of Stay Meetings, dates discussed:   08/05/2013    Comments:  7/28 1330 debbie Razan Siler rn,bsn spoke w pt and sister. pt aware that we are ck w his ins to see if ltac is avail or not. he hax hx of adv homecare and would like to use them if hhc needed. he lives w roommate but when dc home will go home w sister. will await elidg of ltac and assist as needed.

## 2013-08-05 NOTE — Op Note (Signed)
NAMETONNIE, FRIEDEL                ACCOUNT NO.:  [id number]  MEDICAL RECORD NO.:  [id number]  LOCATION:  2M13C                        FACILITY:  [facility]  PHYSICIAN:  Pietro Cassis. [name], M.D.  DATE OF BIRTH:  [birth year]  DATE OF PROCEDURE:  08/04/2013 DATE OF DISCHARGE:                              OPERATIVE REPORT   PREOPERATIVE DIAGNOSIS:  Septic right hip joint.  POSTOPERATIVE DIAGNOSIS:  Septic right hip joint with tracking of infection up the iliopsoas into the inner-wall of the pelvis.  PROCEDURES:  Resection of right hip joint, placement of an antibiotic- laden total hip arthroplasty, cemented an acetabular and femoral components.  SURGEON:  Pietro Cassis. [name], M.D.  ASSISTANT:  Danae Orleans, PA-C.  Note that Mr. Guinevere Scarlet was present for the entirety of the case for preoperative positioning, perioperative management of the operative extremity, general facilitation of the case, fabrication of the cemented femoral stem as well as primary wound closure.  ANESTHESIA:  General.  SPECIMENS:  Fluid from the joint was taken at multiple areas.  This was labeled as right hip joint fluid.  Previous cultures have revealed a group B strep.  DRAINS:  One medium Hemovac.  BLOOD LOSS:  About 100 mL.  COMPLICATION:  None apparent.  INDICATION FOR PROCEDURE:  Mr. Martos is a 53 year old male with multiple comorbid conditions, particularly HIV, who presented to the hospital on July 30, 2013, with increasing hip pain.  CT scan revealed a significant inner-table infectious process tracking from the hip joint. He was admitted to the [facility] Service Intensive Care Unit, Orthopedics was consulted.  One of my partners, Dr. Esmond Plants, was initially involved, and took him to the operating room and placed drains into the pelvis to try to drain this area out.  I was subsequently asked to be involved with the care.  Given the amount of joint involvement, bony and cartilage loss already, I  discussed with him that based on his bone quality, I would try to remove his joint, I would irrigate the joint, I would attempt to further decompress along the iliopsoas tendon and then place a cemented total hip arthroplasty with antibiotics versus an antibiotic spacer mold.  Risks and benefits of the procedure were discussed including the possible need for future surgeries, failure of this, temporary just on component versus the risks of poor bone quality, need to proceed with another option.  Consent was obtained.  PROCEDURE IN DETAIL:  The patient was brought to operative theater. Once adequate anesthesia was established, the patient was positioned into the left lateral decubitus position with the right side up.  He had known sacral decubitus that was dressed and draped out of this surgical area.  In addition, he had been preoperatively managed with therapeutic doses of vancomycin and Zosyn.  The right lower extremity was then prepped and draped in a sterile fashion.  A time-out was performed identifying the patient, planned procedure, and extremity.  Lateral based incision was made for posterior approach to the hip.  The iliotibial band and gluteal fascia were incised for posterior approach.  Short external rotators were taken down separate from the capsule.  The capsule was opened and purulence identified.  Cultures were taken at this level for Gram stain and culture of anaerobic and aerobic.  I then dislocated the hip and then a further blush of pus came out, so a second culture was taken with a red-top tube.  With the hip exposed, I did a neck osteotomy using the oscillating saw.  With the femoral head removed, I then retracted the femur anteriorly and debrided the labral cartilage and any other ligamentous tissue.  My initial plan was to decompress through the iliopsoas tendon; however, there was enough destruction of the structures anterior related to this infection that  the anterior aspect and capsule were opened.  I did decompress down at the iliopsoas given again a blush of pus, but then I was able to place the suction tube up the inner-wall of the pelvis from this area suctioned out and removing more pus.  I then reamed the acetabulum up to 51-mm reamer just to scrape away some remaining cartilage and then irrigated the acetabulum and the wound at this point with 4 liters of normal saline solution.  Once this was done, I attended back to the femur.  The femur was then opened with starting drill, hand reamed once and irrigated to try to prevent fat emboli, and then broached with the Prostalac broaches up to a size 3 broach.  I then irrigated the canal with normal saline solution.  At this point, on the back table, two batches of cement were mixed with 2 g of vancomycin and 2.4 tobramycin and made into Prostalac mold.  Thus, the 46 acetabular shell was opened and then cemented into the acetabulum.  Remaining cement was removed until the cement cured, the acetabular liner was held in approximately 40 degrees of abduction, 20 degrees of forward flexion. Once the cement had cured an acetabulum and the mold, a third batch of cement was mixed with vancomycin and tobramycin.  The Prostalac mold component had been opened and made into a cemented mold using mineral oil.  Once the third batch of cement was prepared, the femoral component was cemented in place about 20 degrees of anteversion and held in place until the cement fully cured.  I did a trial reduction with the 28 ball and then selected a 32+ 1 ball. It was impacted on a clean and dry trunnion.  The hip was reduced.  We irrigated throughout the time with 5 liters of normal saline solution and pulse lavage.  I did place a medium Hemovac drain and try to place into the anterior aspect of the pelvis.  The capsular tissues were left un-reapproximated.  The iliotibial band and gluteal fascia were  reapproximated using #1 PDS suture.  The remainder of the wound was closed with 2-0 Vicryl and staples on the skin.  The skin was then cleaned, dried and dressed sterilely with Mepilex dressing as were the drain sites anteriorly as well as the sacral decubitus.  The patient was then brought to the recovery room in stable condition tolerating the procedure well.     Pietro Cassis [name], M.D.     MDO/MEDQ  D:  08/04/2013  T:  08/05/2013  Job:  [id number]

## 2013-08-05 NOTE — Evaluation (Signed)
Occupational Therapy Evaluation Patient Details Name: [name] [name] MRN: [id number] DOB: 10-06-1960 Today's Date: 08/05/2013    History of Present Illness 53 year old, HIV positive admitted 7/22 with septic arthritis of right hip and abscess extending into the pelvis, underwent I&D and drainage on 7/23 followed by septic shock, hypotensive in spite of full resuscitation. Hence PCCM consulted 7/24. Had hip resection on 08/04/13 with cement placement. Has posterior hip precautions and is PWB right LE.    Clinical Impression   Pt admitted with above. He demonstrates the below listed deficits and will benefit from continued OT to maximize safety and independence with BADLs.  Pt presents to OT with pain; generalized weakness; hypotension limited activity this date.  He was instructed in Posterior THA precautions.  Currently, he is total A with LB ADLs.  He will require post acute rehab - at this time recommending LTACH.       Follow Up Recommendations  LTACH;Supervision/Assistance - 24 hour    Equipment Recommendations  3 in 1 bedside comode;Tub/shower seat    Recommendations for Other Services       Precautions / Restrictions Precautions Precautions: Posterior Hip;Fall Precaution Booklet Issued: Yes (comment) Precaution Comments: Pt instructed in THA precautions - he requires mod verbal cues.  Handout placed in pt room and RN instructed Restrictions Weight Bearing Restrictions: Yes RLE Weight Bearing: Partial weight bearing RLE Partial Weight Bearing Percentage or Pounds: 50      Mobility Bed Mobility Overal bed mobility: Needs Assistance;+2 for physical assistance Bed Mobility: Supine to Sit     Supine to sit: Mod assist;+2 for physical assistance;HOB elevated     General bed mobility comments: Pt needed assist for right LE with pt having right LE pain as well as for elevation of trunk.  Took incr time and need for cues for hip precautions.  Used pad to assist with getting pt  to EOB.    Transfers Overall transfer level:  (unable due to hypotension )                    Balance Overall balance assessment: Needs assistance Sitting-balance support: Feet supported Sitting balance-Leahy Scale: Fair Sitting balance - Comments: Pt was able to sit EOB 10 minutes total with initial mod assist progressing to min guard asssit after some time.  BP soft.  Pt on pressors still and not symptomatic but BP low therefore layed pt back in bed and positioned comfortably.   Postural control: Posterior lean                                  ADL Overall ADL's : Needs assistance/impaired Eating/Feeding: Independent;Bed level   Grooming: Wash/dry hands;Wash/dry face;Oral care;Applying deodorant;Brushing hair;Set up;Bed level   Upper Body Bathing: Minimal assitance;Bed level   Lower Body Bathing: Maximal assistance;Bed level   Upper Body Dressing : Moderate assistance;Sitting;Bed level   Lower Body Dressing: Total assistance;Bed level   Toilet Transfer: Total assistance (unable)   Toileting- Clothing Manipulation and Hygiene: Total assistance;Bed level       Functional mobility during ADLs:  (unable due to hypotension ) General ADL Comments: Pt instructed in THA precautions.  Eval limted to EOB activities due to hypotension      Vision                     Perception     Praxis      Pertinent  Vitals/Pain Pain 6/10 Rt hip.       Hand Dominance     Extremity/Trunk Assessment Upper Extremity Assessment Upper Extremity Assessment: Generalized weakness   Lower Extremity Assessment Lower Extremity Assessment: Defer to PT evaluation RLE: Unable to fully assess due to pain   Cervical / Trunk Assessment Cervical / Trunk Assessment: Normal   Communication Communication Communication: No difficulties   Cognition Arousal/Alertness: Awake/alert Behavior During Therapy: WFL for tasks assessed/performed Overall Cognitive Status: Within  Functional Limits for tasks assessed                     General Comments       Exercises Exercises: General Lower Extremity     Shoulder Instructions      Home Living Family/patient expects to be discharged to:: Private residence Living Arrangements: Other relatives;Non-relatives/Friends Available Help at Discharge: Friend(s);Family;Available 24 hours/day (sisters and a roommate) Type of Home: House Home Access: Stairs to enter [employer] of Steps: 3 Entrance Stairs-Rails: Left Home Layout: One level     Bathroom Shower/Tub: Occupational psychologist: Standard     Home Equipment: Wheelchair - [employer] - 2 wheels (walk in shower)   Additional Comments: Mom is in [place].  Sisters have been assisting pt intermittently for the week PTA      Prior Functioning/Environment Level of Independence: Independent        Comments: Drives, worked up until illness    OT Diagnosis: Generalized weakness;Acute pain   OT Problem List: Decreased strength;Decreased activity tolerance;Impaired balance (sitting and/or standing);Decreased safety awareness;Decreased knowledge of use of DME or AE;Decreased knowledge of precautions;Cardiopulmonary status limiting activity;Pain   OT Treatment/Interventions: Self-care/ADL training;Therapeutic exercise;DME and/or AE instruction;Therapeutic activities;Patient/family education;Balance training    OT Goals(Current goals can be found in the care plan section) Acute Rehab OT Goals Patient Stated Goal: to go home OT Goal Formulation: With patient Time For Goal Achievement: 08/19/13 Potential to Achieve Goals: Good ADL Goals Pt Will Perform Grooming: with min assist;standing Pt Will Perform Lower Body Bathing: with min assist;with adaptive equipment;sit to/from stand Pt Will Perform Lower Body Dressing: with min assist;with adaptive equipment;sit to/from stand Pt Will Transfer to Toilet: with min  assist;ambulating;regular height toilet;grab bars;bedside commode Pt Will Perform Toileting - Clothing Manipulation and hygiene: with min assist;sit to/from stand Pt/caregiver will Perform Home Exercise Program: Increased strength;Right Upper extremity;Left upper extremity;With theraband;With written HEP provided  OT Frequency: Min 2X/week   Barriers to D/C: Decreased caregiver support          Co-evaluation PT/OT/SLP Co-Evaluation/Treatment: Yes Reason for Co-Treatment: Complexity of the patient's impairments (multi-system involvement) PT goals addressed during session: Mobility/safety with mobility OT goals addressed during session: ADL's and self-care      End of Session Nurse Communication: Mobility status;Other (comment) (BP)  Activity Tolerance: Treatment limited secondary to medical complications (Comment) Patient left: in bed;with call bell/phone within reach   Time: 1113-1145 OT Time Calculation (min): 32 min Charges:  OT General Charges $OT Visit: 1 Procedure OT Evaluation $Initial OT Evaluation Tier I: 1 Procedure OT Treatments $Therapeutic Activity: 8-22 mins G-Codes:    Jeweldean Drohan M 08/12/2013, 12:49 PM

## 2013-08-06 DIAGNOSIS — I82409 Acute embolism and thrombosis of unspecified deep veins of unspecified lower extremity: Secondary | ICD-10-CM

## 2013-08-06 DIAGNOSIS — M009 Pyogenic arthritis, unspecified: Secondary | ICD-10-CM

## 2013-08-06 DIAGNOSIS — B951 Streptococcus, group B, as the cause of diseases classified elsewhere: Secondary | ICD-10-CM

## 2013-08-06 LAB — BASIC METABOLIC PANEL
Anion gap: 13 (ref 5–15)
BUN: 10 mg/dL (ref 6–23)
CO2: 20 mEq/L (ref 19–32)
CREATININE: 0.61 mg/dL (ref 0.50–1.35)
Calcium: 7.2 mg/dL — ABNORMAL LOW (ref 8.4–10.5)
Chloride: 104 mEq/L (ref 96–112)
Glucose, Bld: 143 mg/dL — ABNORMAL HIGH (ref 70–99)
Potassium: 3.7 mEq/L (ref 3.7–5.3)
Sodium: 137 mEq/L (ref 137–147)

## 2013-08-06 LAB — CULTURE, BLOOD (ROUTINE X 2)
Culture: NO GROWTH
Culture: NO GROWTH

## 2013-08-06 LAB — PHOSPHORUS: Phosphorus: 2.1 mg/dL — ABNORMAL LOW (ref 2.3–4.6)

## 2013-08-06 LAB — CLOSTRIDIUM DIFFICILE BY PCR: CDIFFPCR: NEGATIVE

## 2013-08-06 LAB — CBC
HEMATOCRIT: 23.3 % — AB (ref 39.0–52.0)
Hemoglobin: 7.9 g/dL — ABNORMAL LOW (ref 13.0–17.0)
MCH: 32.5 pg (ref 26.0–34.0)
MCHC: 33.9 g/dL (ref 30.0–36.0)
MCV: 95.9 fL (ref 78.0–100.0)
Platelets: 143 10*3/uL — ABNORMAL LOW (ref 150–400)
RBC: 2.43 MIL/uL — ABNORMAL LOW (ref 4.22–5.81)
RDW: 16.7 % — ABNORMAL HIGH (ref 11.5–15.5)
WBC: 7.2 10*3/uL (ref 4.0–10.5)

## 2013-08-06 LAB — MAGNESIUM: Magnesium: 1.7 mg/dL (ref 1.5–2.5)

## 2013-08-06 MED ORDER — DEXTROSE 5 % IV SOLN
2.0000 g | INTRAVENOUS | Status: DC
  Administered 2013-08-06 – 2013-08-12 (×7): 2 g via INTRAVENOUS
  Filled 2013-08-06 (×9): qty 2

## 2013-08-06 MED ORDER — LOPERAMIDE HCL 1 MG/5ML PO LIQD
2.0000 mg | ORAL | Status: DC | PRN
  Filled 2013-08-06: qty 10

## 2013-08-06 MED ORDER — MIDODRINE HCL 5 MG PO TABS
10.0000 mg | ORAL_TABLET | Freq: Three times a day (TID) | ORAL | Status: DC
  Administered 2013-08-06 – 2013-08-13 (×20): 10 mg via ORAL
  Filled 2013-08-06 (×24): qty 2

## 2013-08-06 MED ORDER — MAGNESIUM SULFATE 40 MG/ML IJ SOLN
2.0000 g | Freq: Once | INTRAMUSCULAR | Status: AC
  Administered 2013-08-06: 2 g via INTRAVENOUS
  Filled 2013-08-06: qty 50

## 2013-08-06 MED ORDER — POTASSIUM PHOSPHATES 15 MMOLE/5ML IV SOLN
30.0000 mmol | Freq: Once | INTRAVENOUS | Status: AC
  Administered 2013-08-06: 30 mmol via INTRAVENOUS
  Filled 2013-08-06: qty 10

## 2013-08-06 MED ORDER — LOPERAMIDE HCL 2 MG PO CAPS
2.0000 mg | ORAL_CAPSULE | Freq: Four times a day (QID) | ORAL | Status: DC | PRN
  Administered 2013-08-06 – 2013-08-11 (×2): 2 mg via ORAL
  Filled 2013-08-06 (×2): qty 1

## 2013-08-06 MED ORDER — FLUDROCORTISONE ACETATE 0.1 MG PO TABS
0.1000 mg | ORAL_TABLET | Freq: Every day | ORAL | Status: DC
  Administered 2013-08-06 – 2013-08-13 (×8): 0.1 mg via ORAL
  Filled 2013-08-06 (×8): qty 1

## 2013-08-06 NOTE — Consult Note (Addendum)
CARDIOLOGY CONSULT NOTE   Patient ID: [name] [name] MRN: [id number], DOB/AGE: 16-Jun-1960   Admit date: 07/30/2013 Date of Consult: 08/06/2013   Primary Physician: [name], MD Primary Cardiologist: None  Pt. Profile  53 year old HIV-positive gentleman admitted 7/22 with septic arthritis of the right hip and abscess extending into the pelvis.  Persistent hypotension following incision and drainage of the abscess.  Problem List  Past Medical History  Diagnosis Date  . Radiation   . HIV (human immunodeficiency virus infection)   . Shingles     left shoulder, right leg  . Pain in limb 07/23/2013  . Rectal cancer     Squamous cell  . Gout     Past Surgical History  Procedure Laterality Date  . Hernia repair      right  . Hematoma evacuation    . Incision and drainage hip Right 07/31/2013    Procedure: IRRIGATION AND DEBRIDEMENT HIP WITH PERCUTANEOUS DRAIN PLACEMENT;  Surgeon: [name], MD;  Location: [facility];  Service: Orthopedics;  Laterality: Right;  . Total hip arthroplasty Right 08/04/2013    Procedure: RESECTION HIP JOINT - PLACEMENT OF CEMENT PROSTHESIS;  Surgeon: [name], MD;  Location: [facility];  Service: Orthopedics;  Laterality: Right;     Allergies  No Known Allergies  HPI   This 53 year old gentleman was admitted on 07/30/13 with septic arthritis of the right hip and an abscess extending into the pelvis.  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  On 08/04/13 the patient underwent resection of the right hip joint with replacement with a cemented prosthesis by Dr. [name].  The patient has had persistent hypotension during this hospitalization requiring dopamine support.  He had 2 units of packed cells on 08/01/13 for hypovolemic shock.  Midodrine 5 mg 3 times a day was started yesterday and today the dose is to be increased to 10 mg 3 times a day.  His dopamine has been stopped and his systolic  blood pressures remain in the range of 80 systolic.  The pulse is slow and regular in sinus bradycardia.  The patient is not tachycardic from his hypotension.  The patient denies any chest pain or shortness of breath. The patient has no past history of known cardiac disease.  On this admission his EKG on 08/02/13 showed normal sinus rhythm and was within normal limits. The patient underwent an echocardiogram on 08/02/13 which showed normal left ventricular systolic function and ejection fraction of 55-60%.  Diastolic function was normal.  The right ventricle was mildly dilated.  There were no valve abnormalities. His last chest x-ray on 08/01/13 showed mild cardiomegaly on a portable and early CHF could not be excluded. His CVP is 4.  Lab work shows that his hemoglobin is down to 7.9.  It was 10.5  2 days ago.  Renal function is normal. Inpatient Medications  . antiseptic oral rinse  15 mL Mouth Rinse q12n4p  . atazanavir  300 mg Oral Q breakfast  . chlorhexidine  15 mL Mouth Rinse BID  . collagenase   Topical Daily  . docusate sodium  100 mg Oral BID  . enoxaparin (LOVENOX) injection  40 mg Subcutaneous Q24H  . feeding supplement (RESOURCE BREEZE)  1 Container Oral TID BM  . hydrocortisone sodium succinate  50 mg Intravenous Q6H  . lamiVUDine-zidovudine  1 tablet Oral BID  . midodrine  10 mg Oral TID WC  . nystatin  5 mL  Oral QID  . piperacillin-tazobactam (ZOSYN)  IV  3.375 g Intravenous 3 times per day  . ritonavir  100 mg Oral Q breakfast  . sulfamethoxazole-trimethoprim  1 tablet Oral Daily  . tenofovir  300 mg Oral Daily    Family History Family History  Problem Relation Age of Onset  . Diabetes Mother   . Dementia Mother   . Atrial fibrillation Mother   . Hypertension Mother   . Cancer Father   . Emphysema Father      Social History History   Social History  . Marital Status: Single    Spouse Name: N/A    Number of Children: 0  . Years of Education: BA   Occupational  History  . disabled    Social History Main Topics  . Smoking status: Current Every Day Smoker -- 0.30 packs/day    Types: Cigarettes  . Smokeless tobacco: Never Used  . Alcohol Use: 1.5 oz/week    3 drink(s) per week     Comment: occasional  . Drug Use: Yes    Special: Marijuana     Comment: daily  . Sexual Activity: No     Comment: declined condoms   Other Topics Concern  . Not on file   Social History Narrative  . No narrative on file    The patient works at the front desk of the [employer]  Review of Systems  General:  No chills, fever, night sweats or weight changes.  Cardiovascular:  No chest pain, dyspnea on exertion, edema, orthopnea, palpitations, paroxysmal nocturnal dyspnea. Dermatological: No rash, lesions/masses Respiratory: No cough, dyspnea Urologic: No hematuria, dysuria Abdominal:   No nausea, vomiting, diarrhea, bright red blood per rectum, melena, or hematemesis Neurologic:  No visual changes, wkns, changes in mental status. All other systems reviewed and are otherwise negative except as noted above.  Physical Exam  Blood pressure 108/64, pulse 48, temperature 97.5 F (36.4 C), temperature source Oral, resp. rate 15, height 6\' 1"  (1.854 m), weight 142 lb 6.7 oz (64.6 kg), SpO2 100.00%.  General: Pleasant, NAD.  Gen. appearance is that of a very thin chronically ill male in no distress. Psych: Normal affect. Neuro: Alert and oriented X 3. Moves all extremities spontaneously. HEENT: Normal  Neck: Supple without bruits or JVD. Lungs:  Resp regular and unlabored, CTA. Heart: RRR no s3, s4, or murmurs. Abdomen: Soft, non-tender, non-distended, BS + x 4.  Extremities: No clubbing, cyanosis or edema. DP/PT/Radials 2+ and equal bilaterally.  Labs  No results found for this basename: CKTOTAL, CKMB, TROPONINI,  in the last 72 hours Lab Results  Component Value Date   WBC 7.2 08/06/2013   HGB 7.9* 08/06/2013   HCT 23.3* 08/06/2013   MCV 95.9 08/06/2013    PLT 143* 08/06/2013    Recent Labs Lab 07/30/13 2335  08/06/13 0415  NA 131*  < > 137  K 4.1  < > 3.7  CL 94*  < > 104  CO2 24  < > 20  BUN 20  < > 10  CREATININE 0.92  < > 0.61  CALCIUM 9.5  < > 7.2*  PROT 8.3  --   --   BILITOT 0.7  --   --   ALKPHOS 97  --   --   ALT 21  --   --   AST 34  --   --   GLUCOSE 103*  < > 143*  < > = values in this interval not displayed. Lab  Results  Component Value Date   CHOL 142 03/28/2013   HDL 54 03/28/2013   LDLCALC 74 03/28/2013   TRIG 70 03/28/2013   No results found for this basename: DDIMER    Radiology/Studies  Ct Pelvis W Contrast  07/31/2013   CLINICAL DATA:  Right hip pain. Per EPIC notes, outside "CT scan of a right hip shows evidence of an infection around the hip and tracking into the pelvis along the iliopsoas muscle. The patient will require admission to the hospital, I indicated that he should go to the emergency room tonight or tomorrow morning and seek medical attention".  EXAM: CT PELVIS WITH CONTRAST  TECHNIQUE: Multidetector CT imaging of the pelvis was performed using the standard protocol following the bolus administration of intravenous contrast.  CONTRAST:  16mL OMNIPAQUE IOHEXOL 300 MG/ML  SOLN  COMPARISON:  Abdominal pelvic CT 05/07/2007 and 02/25/2003. Outside study is unavailable.  FINDINGS: There is a large complex right hip joint effusion with fluid tracking into the iliopsoas bursa. There is a small amount of air anteriorly within the joint on image 85. Complex fluid within the iliopsoas bursa extends superiorly along the right iliacus muscle, measuring up to 8.9 x 5.2 cm transverse on image 48.  Since the prior CT, the patient has developed marked right hip joint space loss with multifocal destruction of the acetabulum consistent with osteomyelitis. The femoral head demonstrates mild rarefaction without gross cortical destruction or subchondral collapse. There is no dislocation. Apart from the acetabulum, there is no  evidence of pelvic osteomyelitis. The sacroiliac joints and lower lumbar spine appear intact.  There is decubitus ulceration over the lower sacrum and coccyx with associated soft tissue emphysema. There is no gross destruction of the sacrum or coccyx. There appears to be some induration of the skin posterolateral to the right femoral greater trochanter.  Chronic bladder wall thickening appear stable.  IMPRESSION: 1. 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 There is probable early osteomyelitis of the femoral head. 2. Associated infected iliopsoas bursa with superior extension of a large abscess into the pelvis between the iliacus muscle and the right iliac bone. 3. Decubitus ulcer over the lower sacrum and coccyx.   Electronically Signed   By: Camie Patience M.D.   On: 07/31/2013 02:42   Dg Pelvis Portable  08/05/2013   CLINICAL DATA:  Post right hip surgery  EXAM: PORTABLE PELVIS 1-2 VIEWS  COMPARISON:  None.  FINDINGS: Single frontal view of the pelvis submitted. There is right hip prosthesis in anatomic alignment. Small amount of periarticular soft tissue air. Lateral skin staples. Postsurgical drain in place.  IMPRESSION: Right hip prosthesis in anatomic alignment.   Electronically Signed   By: Lahoma Crocker M.D.   On: 08/05/2013 07:59   Dg Chest Port 1 View  08/01/2013   CLINICAL DATA:  Central venous line placement.  EXAM: PORTABLE CHEST - 1 VIEW  COMPARISON:  08/01/2013.  FINDINGS: Central line noted with its tip projected over the cavoatrial junction. Mediastinum and hilar structures are normal.Borderline cardiomegaly, slight prominence of pulmonary vascularity. Very subtle Kerley B-lines are noted. Very mild congestive heart failure cannot be excluded. No pleural effusion or pneumothorax. No acute bony abnormality.  IMPRESSION: 1. Central line in good anatomic position. 2. Can't exclude mild congestive heart failure.   Electronically Signed   By:  Marcello Moores  Register   On: 08/01/2013 15:16   Dg Chest Port 1 View  08/01/2013  CLINICAL DATA:  Postop  EXAM: PORTABLE CHEST - 1 VIEW  COMPARISON:  12/24/2012  FINDINGS: Heart, mediastinum and hila are unremarkable. Lungs are hyperexpanded with mild interstitial prominence and evidence of apical emphysema. No lung consolidation or edema. No pleural effusion or pneumothorax.  Bony thorax is intact.  IMPRESSION: No acute cardiopulmonary disease.   Electronically Signed   By: Lajean Manes M.D.   On: 08/01/2013 09:10    ECG  Repeat EKG pending  ASSESSMENT AND PLAN  1. postoperative hypotension, persistent, following recent septic shock 2. sinus bradycardia, asymptomatic and does not require specific treatment at this time. 3. septic arthritis status post incision and drainage of abscess 7/23 and placement of a temporary right hip prosthesis 727  Recommendation: Agree with trial of midodrine.  The dose is being increased today to 10 mg 3 times a day.  Patient is already on stress steroids in the form of hydrocortisone 50 mg IV every 6 hours.  I will add Florinef. The patient is anemic.  Consider transfusion of packed cells to help with correction of his hypotension. Some of his hypotension is from prolonged bedrest and inactivity and will be expected to improve with time and increased activity and with continued antibiotic therapy of his osteomyelitis and abscess.  Would encourage high salt diet and encourage by mouth fluid intake. Will follow with you.  Signed, Darlin Coco, MD  08/06/2013, 11:27 AM

## 2013-08-06 NOTE — Progress Notes (Signed)
ANTIBIOTIC CONSULT NOTE - FOLLOW UP  Pharmacy Consult for Zosyn Indication: septic arthritis with osteomyelitis  No Known Allergies  Patient Measurements: Height: 6\' 1"  (185.4 cm) Weight: 142 lb 6.7 oz (64.6 kg) IBW/kg (Calculated) : 79.9  Vital Signs: Temp: 97.4 F (36.3 C) (07/29 1223) Temp src: Oral (07/29 1223) BP: 73/45 mmHg (07/29 1200) Pulse Rate: 47 (07/29 1230) Intake/Output from previous day: 07/28 0701 - 07/29 0700 In: 3570.8 [P.O.:360; I.V.:2910.8; IV Piggyback:300] Out: 3748 [OLMBE:6754; Drains:110] Intake/Output from this shift: Total I/O In: 552.9 [I.V.:552.9] Out: 500 [Urine:500]  Labs:  Recent Labs  08/04/13 0430 08/05/13 0445 08/06/13 0415  WBC 8.6 6.9 7.2  HGB 10.5* 8.2* 7.9*  PLT 145* 136* 143*  CREATININE 0.52 0.85 0.61   Estimated Creatinine Clearance: 99.8 ml/min (by C-G formula based on Cr of 0.61). No results found for this basename: VANCOTROUGH, Corlis Leak, VANCORANDOM, GENTTROUGH, GENTPEAK, GENTRANDOM, TOBRATROUGH, TOBRAPEAK, TOBRARND, AMIKACINPEAK, AMIKACINTROU, AMIKACIN,  in the last 72 hours   Microbiology: Recent Results (from the past 720 hour(s))  CULTURE, BLOOD (ROUTINE X 2)     Status: None   Collection Time    07/30/13 11:35 PM      Result Value Ref Range Status   Specimen Description BLOOD RIGHT ARM   Final   Special Requests BOTTLES DRAWN AEROBIC AND ANAEROBIC 5CC   Final   Culture  Setup Time     Final   Value: 07/31/2013 08:53     Performed at [facility]   Culture     Final   Value: NO GROWTH 5 DAYS     Performed at [facility]   Report Status 08/06/2013 FINAL   Final  CULTURE, BLOOD (ROUTINE X 2)     Status: None   Collection Time    07/31/13 12:25 AM      Result Value Ref Range Status   Specimen Description BLOOD LEFT ARM   Final   Special Requests BOTTLES DRAWN AEROBIC AND ANAEROBIC [facility]   Final   Culture  Setup Time     Final   Value: 07/31/2013 08:53     Performed at [facility]   Culture     Final   Value: NO GROWTH 5 DAYS     Performed at [facility]   Report Status 08/06/2013 FINAL   Final  MRSA PCR SCREENING     Status: None   Collection Time    07/31/13  3:47 AM      Result Value Ref Range Status   MRSA by PCR NEGATIVE  NEGATIVE Final   Comment:            The GeneXpert MRSA Assay (FDA     approved for NASAL specimens     only), is one component of a     comprehensive MRSA colonization     surveillance program. It is not     intended to diagnose MRSA     infection nor to guide or     monitor treatment for     MRSA infections.  URINE CULTURE     Status: None   Collection Time    07/31/13  6:29 AM      Result Value Ref Range Status   Specimen Description URINE, RANDOM   Final   Special Requests Immunocompromised   Final   Culture  Setup Time     Final   Value: 07/31/2013 13:32     Performed at [facility]  Count     Final   Value: NO GROWTH     Performed at [facility]   Culture     Final   Value: NO GROWTH     Performed at [facility]   Report Status 08/01/2013 FINAL   Final  CULTURE, ROUTINE-ABSCESS     Status: None   Collection Time    07/31/13  1:36 PM      Result Value Ref Range Status   Specimen Description ABSCESS RIGHT HIP   Final   Special Requests POF ZOSYN AND VANCOMYCIN   Final   Gram Stain     Final   Value: ABUNDANT WBC PRESENT,BOTH PMN AND MONONUCLEAR     NO SQUAMOUS EPITHELIAL CELLS SEEN     MODERATE GRAM POSITIVE COCCI     IN PAIRS IN CHAINS     Performed at [facility]   Culture     Final   Value: MODERATE GROUP B STREP(S.AGALACTIAE)ISOLATED     Note: TESTING AGAINST S. AGALACTIAE NOT ROUTINELY PERFORMED DUE TO PREDICTABILITY OF AMP/PEN/VAN SUSCEPTIBILITY.     Performed at [facility]   Report Status 08/03/2013 FINAL   Final  ANAEROBIC CULTURE     Status: None   Collection Time    07/31/13  1:36 PM      Result Value Ref Range Status   Specimen Description  ABSCESS RIGHT HIP   Final   Special Requests POF ZOSYN AND VANCOMYCIN   Final   Gram Stain     Final   Value: ABUNDANT WBC PRESENT,BOTH PMN AND MONONUCLEAR     NO SQUAMOUS EPITHELIAL CELLS SEEN     MODERATE GRAM POSITIVE COCCI     IN PAIRS IN CHAINS     Performed at [facility]   Culture     Final   Value: NO ANAEROBES ISOLATED     Performed at [facility]   Report Status 08/05/2013 FINAL   Final  BODY FLUID CULTURE     Status: None   Collection Time    08/04/13  4:51 PM      Result Value Ref Range Status   Specimen Description SYNOVIAL FLUID RIGHT HIP   Final   Special Requests PT ON VANCO ZOSYN   Final   Gram Stain     Final   Value: MODERATE WBC PRESENT,BOTH PMN AND MONONUCLEAR     RARE GRAM POSITIVE COCCI     IN CHAINS     Performed at [facility]   Culture     Final   Value: NO GROWTH 1 DAY     Performed at [facility]   Report Status PENDING   Incomplete  BODY FLUID CULTURE     Status: None   Collection Time    08/04/13  4:54 PM      Result Value Ref Range Status   Specimen Description SYNOVIAL FLUID HIP RIGHT   Final   Special Requests FLUID ON SWAB PT ON VANCO ZOSYN   Final   Gram Stain PENDING   Incomplete   Culture     Final   Value: NO GROWTH 1 DAY     Performed at [facility]   Report Status PENDING   Incomplete  ANAEROBIC CULTURE     Status: None   Collection Time    08/04/13  4:54 PM      Result Value Ref Range Status   Specimen Description SYNOVIAL FLUID HIP  RIGHT   Final   Special Requests FLUID ON SWAB PT ON VANCO ZOSYN   Final   Gram Stain     Final   Value: RARE WBC PRESENT,BOTH PMN AND MONONUCLEAR     NO ORGANISMS SEEN     Performed at [facility]   Culture     Final   Value: NO ANAEROBES ISOLATED; CULTURE IN PROGRESS FOR 5 DAYS     Performed at [facility]   Report Status PENDING   Incomplete  CLOSTRIDIUM DIFFICILE BY PCR     Status: None   Collection Time    08/05/13  8:02 PM       Result Value Ref Range Status   C difficile by pcr NEGATIVE  NEGATIVE Final    Anti-infectives   Start     Dose/Rate Route Frequency Ordered Stop   08/04/13 1659  tobramycin (NEBCIN) powder  Status:  Discontinued       As needed 08/04/13 1659 08/04/13 1826   08/04/13 1658  vancomycin (VANCOCIN) powder  Status:  Discontinued       As needed 08/04/13 1658 08/04/13 1826   08/03/13 0400  vancomycin (VANCOCIN) 500 mg in sodium chloride 0.9 % 100 mL IVPB  Status:  Discontinued     500 mg 100 mL/hr over 60 Minutes Intravenous Every 8 hours 08/02/13 2241 08/04/13 0903   08/01/13 1100  vancomycin (VANCOCIN) 500 mg in sodium chloride 0.9 % 100 mL IVPB  Status:  Discontinued     500 mg 100 mL/hr over 60 Minutes Intravenous Every 8 hours 08/01/13 1057 08/02/13 2241   08/01/13 1000  lamiVUDine-zidovudine (COMBIVIR) 150-300 MG per tablet 1 tablet     1 tablet Oral 2 times daily 07/31/13 0346     07/31/13 1615  sulfamethoxazole-trimethoprim (BACTRIM DS) 800-160 MG per tablet 1 tablet     1 tablet Oral Daily 07/31/13 1402     07/31/13 1415  sulfamethoxazole-trimethoprim (BACTRIM DS) 800-160 MG per tablet 1 tablet  Status:  Discontinued     1 tablet Oral Every 12 hours 07/31/13 1400 07/31/13 1402   07/31/13 1400  piperacillin-tazobactam (ZOSYN) IVPB 3.375 g     3.375 g 12.5 mL/hr over 240 Minutes Intravenous 3 times per day 07/31/13 1208     07/31/13 1000  tenofovir (VIREAD) tablet 300 mg     300 mg Oral Daily 07/31/13 0346     07/31/13 1000  cefTRIAXone (ROCEPHIN) 1 g in dextrose 5 % 50 mL IVPB  Status:  Discontinued     1 g 100 mL/hr over 30 Minutes Intravenous Every 12 hours 07/31/13 0331 07/31/13 1208   07/31/13 0800  vancomycin (VANCOCIN) 500 mg in sodium chloride 0.9 % 100 mL IVPB  Status:  Discontinued     500 mg 100 mL/hr over 60 Minutes Intravenous Every 8 hours 07/31/13 0331 07/31/13 1543   07/31/13 0700  atazanavir (REYATAZ) capsule 300 mg     300 mg Oral Daily with breakfast 07/31/13  0346     07/31/13 0700  ritonavir (NORVIR) tablet 100 mg     100 mg Oral Daily with breakfast 07/31/13 0346     07/31/13 0030  cefTRIAXone (ROCEPHIN) 2 g in dextrose 5 % 50 mL IVPB     2 g 100 mL/hr over 30 Minutes Intravenous  Once 07/31/13 0019 07/31/13 0115   07/31/13 0000  vancomycin (VANCOCIN) IVPB 1000 mg/200 mL premix     1,000 mg 200 mL/hr over 60 Minutes Intravenous  Once 07/30/13 2345 07/31/13 0302      Assessment: 60 YOM with R-hip Group B Strep septic arthritis and osteomyelitis of acetabulum on day # 7 of Zosyn  Infectious Disease: HIV + Group B Strep septic arthritis/osteo of right hip, decubitus ulcer s/p I&D with drains plan back to OR this week to remove, HIV. Afebrile, WBC wnl on steroids.  7/23 ceftriaxone x1 7/23 vancomycin >> 7/27 7/23 zosyn >>   7/23 blood>>ngtd 7/23 urine>>neg 7/23 right hip abscess>>Group B Strep   Goal of Therapy:  Clinical resolution of infection  Plan:  Continue Zosyn 3.375g IV q8h- 4 hour infusion.  Follow-up renal function and duration of therapy.   Sloan Leiter, PharmD, BCPS Clinical Pharmacist [phone] 08/06/2013,1:49 PM

## 2013-08-06 NOTE — Progress Notes (Signed)
Physical Therapy Treatment Patient Details Name: [name] [name] MRN: [id number] DOB: 09-30-1960 Today's Date: 08/06/2013    History of Present Illness 53 year old, HIV positive admitted 7/22 with septic arthritis of right hip and abscess extending into the pelvis, underwent I&D and drainage on 7/23 followed by septic shock, hypotensive in spite of full resuscitation. Hence PCCM consulted 7/24. Had hip resection on 08/04/13 with cement placement. Has posterior hip precautions and is PWB right LE.     PT Comments    Pt progressing towards goals, was able to perform sit to stand to RW with +2 mod A and maintain standing for sidesteps to left EOB. PT will continue to follow.   Follow Up Recommendations  LTACH;Supervision/Assistance - 24 hour     Equipment Recommendations  Other (comment) (TBD)    Recommendations for Other Services       Precautions / Restrictions Precautions Precautions: Posterior Hip;Fall Precaution Comments: pt able to verbalize 3/3 post hip precautions but needed vc's to better understand not bending past 90 degrees in siting Restrictions Weight Bearing Restrictions: Yes RLE Weight Bearing: Partial weight bearing RLE Partial Weight Bearing Percentage or Pounds: 50    Mobility  Bed Mobility Overal bed mobility: Needs Assistance;+2 for physical assistance Bed Mobility: Rolling;Sidelying to Sit;Sit to Supine Rolling: Min assist Sidelying to sit: +2 for physical assistance;Mod assist   Sit to supine: Mod assist;+2 for physical assistance   General bed mobility comments: pt able to roll to right and left with only support of right LE to help keep precautions. Pt able to pull on rail to achieve full SL. With supine to sit mod A to elevate trunk and +1 to support RLE. Pt with difficulty wt shifting to right due to pain  Transfers Overall transfer level: Needs assistance Equipment used: Rolling walker (2 wheeled) Transfers: Sit to/from Stand Sit to Stand: Mod  assist;+2 physical assistance         General transfer comment: +2 mod A for sit to stand to help wt shift fwd and power up. Pt able to put small amt of wt on RLE, LLE shaking with effort   Ambulation/Gait Ambulation/Gait assistance: Mod assist;+2 physical assistance Ambulation Distance (Feet): 2 Feet Assistive device: Rolling walker (2 wheeled)       General Gait Details: sidesteps to [employer] to left, RW had to be moved for him and unable to pick feet up but was able to slide them to left with mod A for balance and wt-shifting   Stairs            Wheelchair Mobility    Modified Rankin (Stroke Patients Only)       Balance Overall balance assessment: Needs assistance Sitting-balance support: Bilateral upper extremity supported;Feet supported Sitting balance-Leahy Scale: Fair     Standing balance support: Bilateral upper extremity supported;During functional activity Standing balance-Leahy Scale: Poor                      Cognition Arousal/Alertness: Awake/alert Behavior During Therapy: WFL for tasks assessed/performed Overall Cognitive Status: Within Functional Limits for tasks assessed                      Exercises General Exercises - Lower Extremity Ankle Circles/Pumps: AROM;Both;10 reps;Supine Long Arc Quad: AROM;Right;10 reps;Sidelying Heel Slides: AROM;Right;10 reps Hip ABduction/ADduction: AAROM;Right;10 reps    General Comments        Pertinent Vitals/Pain 6/10 LLE pain, repositioned for comfort    Home Living  Prior Function            PT Goals (current goals can now be found in the care plan section) Acute Rehab PT Goals Patient Stated Goal: to go home PT Goal Formulation: With patient Time For Goal Achievement: 08/19/13 Potential to Achieve Goals: Good Progress towards PT goals: Progressing toward goals    Frequency  Min 3X/week    PT Plan Current plan remains appropriate     Co-evaluation             End of Session   Activity Tolerance: Patient tolerated treatment well Patient left: in bed;with call bell/phone within reach     Time: 1430-1501 PT Time Calculation (min): 31 min  Charges:  $Therapeutic Activity: 23-37 mins                    G Codes:     Leighton Roach, PT  Acute Rehab Services  [phone]  Leighton Roach 08/06/2013, 3:29 PM

## 2013-08-06 NOTE — Progress Notes (Signed)
PULMONARY / CRITICAL CARE MEDICINE   Name: [name] [name] MRN: [id number] DOB: 1960/07/06    ADMISSION DATE:  07/30/2013 CONSULTATION DATE:  08/06/2013   REFERRING MD :  Kristine Garbe   CHIEF COMPLAINT:  Hypotension  INITIAL PRESENTATION: 53 year old, HIV positive admitted 7/22 with septic arthritis of right hip and abscess extending into the pelvis, underwent I&D and drainage on 7/23 followed by septic shock, hypotensive in spite of full resuscitation. 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 There is probable early osteomyelitis of the femoral head.  Associated infected iliopsoas bursa with superior extension of a  large abscess into the pelvis between the iliacus muscle and the right iliac bone.Decubitus ulcer over the lower sacrum and coccyx. 7/28 Pelvis X-ray >> aligned R-hip prosthesis  SIGNIFICANT EVENTS: 7/23 - Admit 7/23 - Right hip/pelvis, limited I and D and placement of close suction drainage intra and extra pelvic areas [name]) 7/24 - Hypovolemic shock, s/p 4 L NS bolus, 2u PRBC, pressors 7/25 - ID consulted 7/26 - Ortho discussed with Dr. [name] at [facility] (may need to be transferred) 7/27 - Returned to OR for resection R-hip d/t septic jt with tracking up iliopsoas sheath >> placed temporary prosthseis 7/28 - Remains on Dopamine 10 mcg (intermitt brady), Urinary retention >> foley placed, R-hip vac drain         - PT/OT req LTACH         - Weaning Dopamine >> start Midodrine 7/29 - Remains on Dopamine (unable to wean off d/t hypotension), cont Midodrine  ANTIBIOTICS: Ceftriaxone 7/23 >>> 7/23 Vancomycin 7/23 >>> 7/27 Zosyn 7/23 >>> Bactrim DS HAART  CULTURES: Blood 7/23 >>> NGTD Urine 7/23 >>> negative Wound 7/23 >>> mod group B strep, no anaerobes R-hip synovial fluid 7/27 >>> mod WBC, rare GPC chains >>> NGTD Wound 7/27 >>> no growth, no  anaerobes C.Diff 7/28 >>> negative  LINES/TUBES: Foley 7/27 >>> R-hip wound vac drain 7/27 >>>  SUBJECTIVE: Feels better today, improved R-hip pain with R-hip wound vac drain still in place. Tolerating PO well. No new complaints.  VITAL SIGNS: Temp:  [97.5 F (36.4 C)-98.2 F (36.8 C)] 97.5 F (36.4 C) (07/29 0400) Pulse Rate:  [42-129] 51 (07/29 0700) Resp:  [8-27] 27 (07/29 0700) BP: (74-115)/(43-72) 108/63 mmHg (07/29 0600) SpO2:  [86 %-100 %] 100 % (07/29 0700) Arterial Line BP: (74-171)/(35-106) 119/52 mmHg (07/29 0700)  HEMODYNAMICS: CVP:  [4 mmHg] 4 mmHg  VENTILATOR SETTINGS:   INTAKE / OUTPUT:  Intake/Output Summary (Last 24 hours) at 08/06/13 0738 Last data filed at 08/06/13 0700  Gross per 24 hour  Intake 3570.8 ml  Output   1775 ml  Net 1795.8 ml    PHYSICAL EXAMINATION: Gen - sitting up in bed eating, conversational, NAD Lungs: CTAB, nml effort Cardiovascular: RRR, no murmurs Abdomen: soft, NTND, +active BS Musculoskeletal: R hip/groin adhesive dressings in place and dry, wound vac in place (bloody drainage) Ext: no edema Neuro:  Awake, alert, oriented, non focal Skin:  Warm, no lesions/ rash, good cap refill, sacral decub -unstagable (at least Stage 4) 6cm x 5cm  LABS:  CBC  Recent Labs Lab 08/04/13 0430 08/05/13 0445 08/06/13 0415  WBC 8.6 6.9 7.2  HGB 10.5* 8.2* 7.9*  HCT 31.2* 25.3* 23.3*  PLT 145* 136* 143*   Coag's  Recent Labs Lab 07/31/13 0530  INR 1.62*   BMET  Recent  Labs Lab 08/04/13 0430 08/05/13 0445 08/06/13 0415  NA 138 137 137  K 3.1* 3.5* 3.7  CL 101 105 104  CO2 24 21 20   BUN 6 12 10   CREATININE 0.52 0.85 0.61  GLUCOSE 90 86 143*   Electrolytes  Recent Labs Lab 08/04/13 0430 08/04/13 2000 08/05/13 0445 08/06/13 0415  CALCIUM 7.5*  --  7.3* 7.2*  MG 1.3* 2.4 2.3 1.7  PHOS 1.5* 3.5 3.4 2.1*   Sepsis Markers  Recent Labs Lab 07/30/13 2342 08/01/13 1505 08/02/13 1207  LATICACIDVEN 1.58 0.8  1.0   ABG  Recent Labs Lab 08/01/13 1641 08/05/13 0350  PHART 7.305* 7.387  PCO2ART 33.9* 36.6  PO2ART 76.0* 149.0*   Liver Enzymes  Recent Labs Lab 07/30/13 2335  AST 34  ALT 21  ALKPHOS 97  BILITOT 0.7  ALBUMIN 2.0*   Cardiac Enzymes  Recent Labs Lab 08/02/13 1206 08/02/13 1810 08/03/13 0001  TROPONINI <0.30 <0.30 <0.30   Glucose  Recent Labs Lab 08/01/13 1143 08/01/13 1445 08/01/13 2001 08/02/13 1951  GLUCAP 92 107* 111* 110*    Imaging Dg Pelvis Portable  08/05/2013   CLINICAL DATA:  Post right hip surgery  EXAM: PORTABLE PELVIS 1-2 VIEWS  COMPARISON:  None.  FINDINGS: Single frontal view of the pelvis submitted. There is right hip prosthesis in anatomic alignment. Small amount of periarticular soft tissue air. Lateral skin staples. Postsurgical drain in place.  IMPRESSION: Right hip prosthesis in anatomic alignment.   Electronically Signed   By: Lahoma Crocker M.D.   On: 08/05/2013 07:59   ASSESSMENT / PLAN:  PULMONARY A: No acute issues Tobacco abuse P:   - Supplemental O2 PRN - Smoking cessation  CARDIOVASCULAR  A: Septic shock - Resolving, remains on Dopamine (weaning), Midodrine Transient sinus bradycardia P:  - Monitor BP, MAP goal >60-65, currently at goal - Continue Dopamine - Midodrine increased to 10 mg PO q8 hours. - Cardiology consult to be called today - Dec IVF to NS @ 75 cc/hr, will KVO once BP is more stable  RENAL A: Hypokalemia - 3.7, Resolved Concern AKI - Improved Cr, good UOP Hx urethral urinary obstruction, s/p urethral dilatation in past followed by Urology P:   - Monitor BMET, replace electrolytes - Continue Foley  GI A:   No acute issues P:   - Regular diet  HEMATOLOGIC A:  Acute blood loss anemia, s/p 2u PRBC 7/24 post-op - Dec 8.2 >> 7.9 today Thrombocytopenia, chronic low nml - Stable P:  - Monitor CBC - Transfuse to for hgb <7 - SCDs for VTE prophyalxis, post-op  INFECTIOUS A: Septic arthritis  - with extension osteo / abscess - s/p OR 7/23 and 7/27 placed temp R-hip prosthesis 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 Goal to wean off of Dopamine today, and consider transfer to SDU. Otherwise stable and Ortho following improved s/p temp R-hip prosthesis with R-hip wound vac drain in place.  CC time 35  min.  Patient seen and examined, agree with above note.  I dictated the care and orders written for this patient under my direction.  Rush Farmer, MD [phone]  08/06/2013  7:38 AM

## 2013-08-06 NOTE — Progress Notes (Signed)
Patient ID: [name] [name] [name], male   DOB: 07-10-1960, 53 y.o.   MRN: [id number]         [place] for Infectious Disease    Date of Admission:  07/30/2013           Day 7 piperacillin tazobactam  Principal Problem:   Septic arthritis of hip Active Problems:   HIV DISEASE   ANEMIA, CHRONIC   Decubitus ulcer of sacral region, stage 4   Protein-calorie malnutrition, severe   Septic shock(785.52)   . antiseptic oral rinse  15 mL Mouth Rinse q12n4p  . atazanavir  300 mg Oral Q breakfast  . chlorhexidine  15 mL Mouth Rinse BID  . collagenase   Topical Daily  . docusate sodium  100 mg Oral BID  . enoxaparin (LOVENOX) injection  40 mg Subcutaneous Q24H  . feeding supplement (RESOURCE BREEZE)  1 Container Oral TID BM  . fludrocortisone  0.1 mg Oral Daily  . hydrocortisone sodium succinate  50 mg Intravenous Q6H  . lamiVUDine-zidovudine  1 tablet Oral BID  . midodrine  10 mg Oral TID WC  . nystatin  5 mL Oral QID  . piperacillin-tazobactam (ZOSYN)  IV  3.375 g Intravenous 3 times per day  . potassium phosphate IVPB (mmol)  30 mmol Intravenous Once  . ritonavir  100 mg Oral Q breakfast  . sulfamethoxazole-trimethoprim  1 tablet Oral Daily  . tenofovir  300 mg Oral Daily    Subjective: He states his hip pain is under good control. He has been struggling with hip pain for the last 7 months. He has also been under a great deal of financial stress and emotional stress. He's had trouble working recently because of his pain and he recently had to put his elderly mother in a nursing home. He admits to missing his HIV medications fairly frequently over the last several months.  Objective: Temp:  [97.4 F (36.3 C)-97.9 F (36.6 C)] 97.8 F (36.6 C) (07/29 1539) Pulse Rate:  [42-102] 59 (07/29 1515) Resp:  [8-27] 24 (07/29 1600) BP: (73-119)/(42-72) 84/51 mmHg (07/29 1500) SpO2:  [94 %-100 %] 100 % (07/29 1600) Arterial Line BP: (74-148)/(27-83) 106/54 mmHg (07/29 1515)  [name] [name]:  Alert, smiling and in no distress Skin: No acute rash Lungs: Clear Cor: Regular S1 and S2 with no murmurs  Lab Results Lab Results  Component Value Date   WBC 7.2 08/06/2013   HGB 7.9* 08/06/2013   HCT 23.3* 08/06/2013   MCV 95.9 08/06/2013   PLT 143* 08/06/2013    Lab Results  Component Value Date   CREATININE 0.61 08/06/2013   BUN 10 08/06/2013   NA 137 08/06/2013   K 3.7 08/06/2013   CL 104 08/06/2013   CO2 20 08/06/2013    Lab Results  Component Value Date   ALT 21 07/30/2013   AST 34 07/30/2013   ALKPHOS 97 07/30/2013   BILITOT 0.7 07/30/2013      Microbiology: Recent Results (from the past 240 hour(s))  CULTURE, BLOOD (ROUTINE X 2)     Status: None   Collection Time    07/30/13 11:35 PM      Result Value Ref Range Status   Specimen Description BLOOD RIGHT ARM   Final   Special Requests BOTTLES DRAWN AEROBIC AND ANAEROBIC 5CC   Final   Culture  Setup Time     Final   Value: 07/31/2013 08:53     Performed at [facility]  Final   Value: NO GROWTH 5 DAYS     Performed at [facility]   Report Status 08/06/2013 FINAL   Final  CULTURE, BLOOD (ROUTINE X 2)     Status: None   Collection Time    07/31/13 12:25 AM      Result Value Ref Range Status   Specimen Description BLOOD LEFT ARM   Final   Special Requests BOTTLES DRAWN AEROBIC AND ANAEROBIC [facility]   Final   Culture  Setup Time     Final   Value: 07/31/2013 08:53     Performed at [facility]   Culture     Final   Value: NO GROWTH 5 DAYS     Performed at [facility]   Report Status 08/06/2013 FINAL   Final  MRSA PCR SCREENING     Status: None   Collection Time    07/31/13  3:47 AM      Result Value Ref Range Status   MRSA by PCR NEGATIVE  NEGATIVE Final   Comment:            The GeneXpert MRSA Assay (FDA     approved for NASAL specimens     only), is one component of a     comprehensive MRSA colonization     surveillance program. It is not     intended to diagnose  MRSA     infection nor to guide or     monitor treatment for     MRSA infections.  URINE CULTURE     Status: None   Collection Time    07/31/13  6:29 AM      Result Value Ref Range Status   Specimen Description URINE, RANDOM   Final   Special Requests Immunocompromised   Final   Culture  Setup Time     Final   Value: 07/31/2013 13:32     Performed at [facility]     Final   Value: NO GROWTH     Performed at [facility]   Culture     Final   Value: NO GROWTH     Performed at [facility]   Report Status 08/01/2013 FINAL   Final  CULTURE, ROUTINE-ABSCESS     Status: None   Collection Time    07/31/13  1:36 PM      Result Value Ref Range Status   Specimen Description ABSCESS RIGHT HIP   Final   Special Requests POF ZOSYN AND VANCOMYCIN   Final   Gram Stain     Final   Value: ABUNDANT WBC PRESENT,BOTH PMN AND MONONUCLEAR     NO SQUAMOUS EPITHELIAL CELLS SEEN     MODERATE GRAM POSITIVE COCCI     IN PAIRS IN CHAINS     Performed at [facility]   Culture     Final   Value: MODERATE GROUP B STREP(S.AGALACTIAE)ISOLATED     Note: TESTING AGAINST S. AGALACTIAE NOT ROUTINELY PERFORMED DUE TO PREDICTABILITY OF AMP/PEN/VAN SUSCEPTIBILITY.     Performed at [facility]   Report Status 08/03/2013 FINAL   Final  ANAEROBIC CULTURE     Status: None   Collection Time    07/31/13  1:36 PM      Result Value Ref Range Status   Specimen Description ABSCESS RIGHT HIP   Final   Special Requests POF ZOSYN AND VANCOMYCIN   Final   Gram Stain  Final   Value: ABUNDANT WBC PRESENT,BOTH PMN AND MONONUCLEAR     NO SQUAMOUS EPITHELIAL CELLS SEEN     MODERATE GRAM POSITIVE COCCI     IN PAIRS IN CHAINS     Performed at [facility]   Culture     Final   Value: NO ANAEROBES ISOLATED     Performed at [facility]   Report Status 08/05/2013 FINAL   Final  BODY FLUID CULTURE     Status: None   Collection Time    08/04/13   4:51 PM      Result Value Ref Range Status   Specimen Description SYNOVIAL FLUID RIGHT HIP   Final   Special Requests PT ON VANCO ZOSYN   Final   Gram Stain     Final   Value: MODERATE WBC PRESENT,BOTH PMN AND MONONUCLEAR     RARE GRAM POSITIVE COCCI     IN CHAINS     Performed at [facility]   Culture     Final   Value: MODERATE GROUP B STREP(S.AGALACTIAE)ISOLATED     Performed at [facility]   Report Status PENDING   Incomplete  BODY FLUID CULTURE     Status: None   Collection Time    08/04/13  4:54 PM      Result Value Ref Range Status   Specimen Description SYNOVIAL FLUID HIP RIGHT   Final   Special Requests FLUID ON SWAB PT ON VANCO ZOSYN   Final   Gram Stain     Final   Value: RARE WBC PRESENT,BOTH PMN AND MONONUCLEAR     NO ORGANISMS SEEN     Performed at [facility]   Culture     Final   Value: RARE GROUP B STREP(S.AGALACTIAE)ISOLATED     Performed at [facility]   Report Status PENDING   Incomplete  ANAEROBIC CULTURE     Status: None   Collection Time    08/04/13  4:54 PM      Result Value Ref Range Status   Specimen Description SYNOVIAL FLUID HIP RIGHT   Final   Special Requests FLUID ON SWAB PT ON VANCO ZOSYN   Final   Gram Stain     Final   Value: RARE WBC PRESENT,BOTH PMN AND MONONUCLEAR     NO ORGANISMS SEEN     Performed at [facility]   Culture     Final   Value: NO ANAEROBES ISOLATED; CULTURE IN PROGRESS FOR 5 DAYS     Performed at [facility]   Report Status PENDING   Incomplete  CLOSTRIDIUM DIFFICILE BY PCR     Status: None   Collection Time    08/05/13  8:02 PM      Result Value Ref Range Status   C difficile by pcr NEGATIVE  NEGATIVE Final    Studies/Results: Dg Pelvis Portable  08/05/2013   CLINICAL DATA:  Post right hip surgery  EXAM: PORTABLE PELVIS 1-2 VIEWS  COMPARISON:  None.  FINDINGS: Single frontal view of the pelvis submitted. There is right hip prosthesis in anatomic alignment.  Small amount of periarticular soft tissue air. Lateral skin staples. Postsurgical drain in place.  IMPRESSION: Right hip prosthesis in anatomic alignment.   Electronically Signed   By: Lahoma Crocker M.D.   On: 08/05/2013 07:59    Assessment: Operative cultures have grown group B strep. He has extensive right hip and pelvic infection. I will narrow antibiotic  therapy to ceftriaxone and have a PICC placed in anticipation of prolonged antibiotic therapy.  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 I will continue his current antiretroviral regimen for now.  Plan: 1. Change to ceftriaxone 2. PICC placement 3. Continue current antiretroviral therapy  Michel Bickers, MD [facility] for Infectious [place] [phone] pager   [phone] cell 08/06/2013, 4:25 PM

## 2013-08-07 DIAGNOSIS — I9589 Other hypotension: Secondary | ICD-10-CM

## 2013-08-07 LAB — BASIC METABOLIC PANEL
Anion gap: 11 (ref 5–15)
BUN: 14 mg/dL (ref 6–23)
CHLORIDE: 106 meq/L (ref 96–112)
CO2: 20 meq/L (ref 19–32)
Calcium: 6.8 mg/dL — ABNORMAL LOW (ref 8.4–10.5)
Creatinine, Ser: 0.89 mg/dL (ref 0.50–1.35)
GFR calc Af Amer: >90 mL/min (ref >90)
GFR calc non Af Amer: >90 mL/min (ref >90)
GLUCOSE: 123 mg/dL — AB (ref 70–99)
POTASSIUM: 3.4 meq/L — AB (ref 3.7–5.3)
SODIUM: 137 meq/L (ref 137–147)

## 2013-08-07 LAB — BODY FLUID CULTURE

## 2013-08-07 LAB — CBC
HEMATOCRIT: 21 % — AB (ref 39.0–52.0)
HEMOGLOBIN: 7.2 g/dL — AB (ref 13.0–17.0)
MCH: 33 pg (ref 26.0–34.0)
MCHC: 34.3 g/dL (ref 30.0–36.0)
MCV: 96.3 fL (ref 78.0–100.0)
Platelets: 149 10*3/uL — ABNORMAL LOW (ref 150–400)
RBC: 2.18 MIL/uL — AB (ref 4.22–5.81)
RDW: 16.5 % — ABNORMAL HIGH (ref 11.5–15.5)
WBC: 5.8 10*3/uL (ref 4.0–10.5)

## 2013-08-07 LAB — MAGNESIUM: MAGNESIUM: 2.1 mg/dL (ref 1.5–2.5)

## 2013-08-07 LAB — PHOSPHORUS: Phosphorus: 3 mg/dL (ref 2.3–4.6)

## 2013-08-07 MED ORDER — SODIUM CHLORIDE 0.9 % IJ SOLN
10.0000 mL | Freq: Two times a day (BID) | INTRAMUSCULAR | Status: DC
  Administered 2013-08-07 – 2013-08-12 (×6): 10 mL

## 2013-08-07 MED ORDER — POTASSIUM PHOSPHATES 15 MMOLE/5ML IV SOLN
30.0000 mmol | Freq: Once | INTRAVENOUS | Status: AC
Start: 2013-08-07 — End: 2013-08-07
  Administered 2013-08-07: 30 mmol via INTRAVENOUS
  Filled 2013-08-07: qty 10

## 2013-08-07 MED ORDER — POTASSIUM CHLORIDE CRYS ER 20 MEQ PO TBCR
40.0000 meq | EXTENDED_RELEASE_TABLET | Freq: Once | ORAL | Status: AC
  Administered 2013-08-07: 40 meq via ORAL
  Filled 2013-08-07: qty 2

## 2013-08-07 MED ORDER — SODIUM CHLORIDE 0.9 % IJ SOLN
10.0000 mL | INTRAMUSCULAR | Status: DC | PRN
  Administered 2013-08-11 – 2013-08-12 (×4): 10 mL

## 2013-08-07 NOTE — Progress Notes (Signed)
Patient ID: [name] [name], male   DOB: 1960/06/30, 53 y.o.   MRN: [id number] Subjective: 3 Days Post-Op Procedure(s) (LRB): RESECTION HIP JOINT - PLACEMENT OF CEMENT PROSTHESIS (Right)    Patient reports pain as mild past 2 days.  No events.  Off dopamine since last night. Stood at bedside and side stepped for first time in a while with out much pain, a bit anxious but happy that he was able to do so  Objective:   VITALS:   Filed Vitals:   08/07/13 0600  BP: 86/59  Pulse: 45  Temp:   Resp: 9    Neurovascular intact Incision: scant drainage  HV drain removed this am  LABS  Recent Labs  08/05/13 0445 08/06/13 0415 08/07/13 0430  HGB 8.2* 7.9* 7.2*  HCT 25.3* 23.3* 21.0*  WBC 6.9 7.2 5.8  PLT 136* 143* 149*     Recent Labs  08/05/13 0445 08/06/13 0415 08/07/13 0430  NA 137 137 137  K 3.5* 3.7 3.4*  BUN 12 10 14   CREATININE 0.85 0.61 0.89  GLUCOSE 86 143* 123*    No results found for this basename: LABPT, INR,  in the last 72 hours   Assessment/Plan: 3 Days Post-Op Procedure(s) (LRB): RESECTION HIP JOINT - PLACEMENT OF CEMENT PROSTHESIS (Right)   Advance diet Up with therapy, trying to slowing improve stamina  Continue IV with PIC line for 6 weeks after resection, antibiotic selection per ID consult Will follow while here, staples out in 11 days  Daily dressing changes prn to protect from sacral decubitus wound Call with Ortho questions, [phone]

## 2013-08-07 NOTE — Progress Notes (Addendum)
Patient ID: [name] [name], male   DOB: 1960-10-27, 53 y.o.   MRN: [id number]         [facility] for Infectious Disease    Date of Admission:  07/30/2013           Day 8 antibiotics  Principal Problem:   Septic arthritis of hip Active Problems:   HIV DISEASE   ANEMIA, CHRONIC   Decubitus ulcer of sacral region, stage 4   Protein-calorie malnutrition, severe   Septic shock(785.52)   . atazanavir  300 mg Oral Q breakfast  . cefTRIAXone (ROCEPHIN)  IV  2 g Intravenous Q24H  . collagenase   Topical Daily  . docusate sodium  100 mg Oral BID  . enoxaparin (LOVENOX) injection  40 mg Subcutaneous Q24H  . feeding supplement (RESOURCE BREEZE)  1 Container Oral TID BM  . fludrocortisone  0.1 mg Oral Daily  . hydrocortisone sodium succinate  50 mg Intravenous Q6H  . lamiVUDine-zidovudine  1 tablet Oral BID  . midodrine  10 mg Oral TID WC  . nystatin  5 mL Oral QID  . potassium phosphate IVPB (mmol)  30 mmol Intravenous Once  . ritonavir  100 mg Oral Q breakfast  . sulfamethoxazole-trimethoprim  1 tablet Oral Daily  . tenofovir  300 mg Oral Daily    Subjective: He is feeling better.  Objective: Temp:  [97.3 F (36.3 C)-97.8 F (36.6 C)] 97.6 F (36.4 C) (07/30 0800) Pulse Rate:  [38-85] 52 (07/30 1000) Resp:  [8-24] 18 (07/30 1000) BP: (73-129)/(42-104) 77/44 mmHg (07/30 1000) SpO2:  [94 %-100 %] 100 % (07/30 1000) Arterial Line BP: (91-129)/(37-83) 118/49 mmHg (07/30 0800)  General: Alert, smiling and in no distress. He is sitting up in a chair Skin: No acute rash Lungs: Clear Cor: Regular S1 and S2 with no murmurs  Lab Results Lab Results  Component Value Date   WBC 5.8 08/07/2013   HGB 7.2* 08/07/2013   HCT 21.0* 08/07/2013   MCV 96.3 08/07/2013   PLT 149* 08/07/2013    Lab Results  Component Value Date   CREATININE 0.89 08/07/2013   BUN 14 08/07/2013   NA 137 08/07/2013   K 3.4* 08/07/2013   CL 106 08/07/2013   CO2 20 08/07/2013    Lab Results  Component  Value Date   ALT 21 07/30/2013   AST 34 07/30/2013   ALKPHOS 97 07/30/2013   BILITOT 0.7 07/30/2013      Microbiology: Recent Results (from the past 240 hour(s))  CULTURE, BLOOD (ROUTINE X 2)     Status: None   Collection Time    07/30/13 11:35 PM      Result Value Ref Range Status   Specimen Description BLOOD RIGHT ARM   Final   Special Requests BOTTLES DRAWN AEROBIC AND ANAEROBIC 5CC   Final   Culture  Setup Time     Final   Value: 07/31/2013 08:53     Performed at [facility]   Culture     Final   Value: NO GROWTH 5 DAYS     Performed at [facility]   Report Status 08/06/2013 FINAL   Final  CULTURE, BLOOD (ROUTINE X 2)     Status: None   Collection Time    07/31/13 12:25 AM      Result Value Ref Range Status   Specimen Description BLOOD LEFT ARM   Final   Special Requests BOTTLES DRAWN AEROBIC AND ANAEROBIC [place]   Final  Culture  Setup Time     Final   Value: 07/31/2013 08:53     Performed at [facility]   Culture     Final   Value: NO GROWTH 5 DAYS     Performed at [facility]   Report Status 08/06/2013 FINAL   Final  MRSA PCR SCREENING     Status: None   Collection Time    07/31/13  3:47 AM      Result Value Ref Range Status   MRSA by PCR NEGATIVE  NEGATIVE Final   Comment:            The GeneXpert MRSA Assay (FDA     approved for NASAL specimens     only), is one component of a     comprehensive MRSA colonization     surveillance program. It is not     intended to diagnose MRSA     infection nor to guide or     monitor treatment for     MRSA infections.  URINE CULTURE     Status: None   Collection Time    07/31/13  6:29 AM      Result Value Ref Range Status   Specimen Description URINE, RANDOM   Final   Special Requests Immunocompromised   Final   Culture  Setup Time     Final   Value: 07/31/2013 13:32     Performed at [facility]     Final   Value: NO GROWTH     Performed at [facility]   Culture     Final   Value: NO GROWTH     Performed at [facility]   Report Status 08/01/2013 FINAL   Final  CULTURE, ROUTINE-ABSCESS     Status: None   Collection Time    07/31/13  1:36 PM      Result Value Ref Range Status   Specimen Description ABSCESS RIGHT HIP   Final   Special Requests POF ZOSYN AND VANCOMYCIN   Final   Gram Stain     Final   Value: ABUNDANT WBC PRESENT,BOTH PMN AND MONONUCLEAR     NO SQUAMOUS EPITHELIAL CELLS SEEN     MODERATE GRAM POSITIVE COCCI     IN PAIRS IN CHAINS     Performed at [facility]   Culture     Final   Value: MODERATE GROUP B STREP(S.AGALACTIAE)ISOLATED     Note: TESTING AGAINST S. AGALACTIAE NOT ROUTINELY PERFORMED DUE TO PREDICTABILITY OF AMP/PEN/VAN SUSCEPTIBILITY.     Performed at [facility]   Report Status 08/03/2013 FINAL   Final  ANAEROBIC CULTURE     Status: None   Collection Time    07/31/13  1:36 PM      Result Value Ref Range Status   Specimen Description ABSCESS RIGHT HIP   Final   Special Requests POF ZOSYN AND VANCOMYCIN   Final   Gram Stain     Final   Value: ABUNDANT WBC PRESENT,BOTH PMN AND MONONUCLEAR     NO SQUAMOUS EPITHELIAL CELLS SEEN     MODERATE GRAM POSITIVE COCCI     IN PAIRS IN CHAINS     Performed at [facility]   Culture     Final   Value: NO ANAEROBES ISOLATED     Performed at [facility]   Report Status 08/05/2013 FINAL   Final  BODY FLUID CULTURE  Status: None   Collection Time    08/04/13  4:51 PM      Result Value Ref Range Status   Specimen Description SYNOVIAL FLUID RIGHT HIP   Final   Special Requests PT ON VANCO ZOSYN   Final   Gram Stain     Final   Value: MODERATE WBC PRESENT,BOTH PMN AND MONONUCLEAR     RARE GRAM POSITIVE COCCI     IN CHAINS     Performed at [facility]   Culture     Final   Value: MODERATE GROUP B STREP(S.AGALACTIAE)ISOLATED     Performed at [facility]   Report Status 08/07/2013 FINAL    Final   Organism ID, Bacteria GROUP B STREP(S.AGALACTIAE)ISOLATED   Final  BODY FLUID CULTURE     Status: None   Collection Time    08/04/13  4:54 PM      Result Value Ref Range Status   Specimen Description SYNOVIAL FLUID HIP RIGHT   Final   Special Requests FLUID ON SWAB PT ON VANCO ZOSYN   Final   Gram Stain     Final   Value: RARE WBC PRESENT,BOTH PMN AND MONONUCLEAR     NO ORGANISMS SEEN     Performed at [facility]   Culture     Final   Value: RARE GROUP B STREP(S.AGALACTIAE)ISOLATED     Note: SUSCEPTIBILITIES PERFORMED ON PREVIOUS CULTURE WITHIN THE LAST 5 DAYS.     Performed at [facility]   Report Status 08/07/2013 FINAL   Final  ANAEROBIC CULTURE     Status: None   Collection Time    08/04/13  4:54 PM      Result Value Ref Range Status   Specimen Description SYNOVIAL FLUID HIP RIGHT   Final   Special Requests FLUID ON SWAB PT ON VANCO ZOSYN   Final   Gram Stain     Final   Value: RARE WBC PRESENT,BOTH PMN AND MONONUCLEAR     NO ORGANISMS SEEN     Performed at [facility]   Culture     Final   Value: NO ANAEROBES ISOLATED; CULTURE IN PROGRESS FOR 5 DAYS     Performed at [facility]   Report Status PENDING   Incomplete  CLOSTRIDIUM DIFFICILE BY PCR     Status: None   Collection Time    08/05/13  8:02 PM      Result Value Ref Range Status   C difficile by pcr NEGATIVE  NEGATIVE Final    Studies/Results: No results found.  Assessment: He is awaiting PICC placement in anticipation of at least 6 weeks of IV ceftriaxone for his complicated group B strep right hip infection.  Plan: 1. Continue ceftriaxone 2. PICC placement 3. Continue current antiretroviral therapy  Michel Bickers, MD [facility] for Infectious [place] [phone] pager   [phone] cell 08/07/2013, 11:40 AM

## 2013-08-07 NOTE — Progress Notes (Signed)
IM[facility] was contacted by Dr. Nelda Marseille with PCCM that the patient is being transferred to SDU today. IMTS to take over care tomorrow 08/08/13.

## 2013-08-07 NOTE — Progress Notes (Signed)
PULMONARY / CRITICAL CARE MEDICINE   Name: [name] [name] MRN: [id number] DOB: 30-May-1960    ADMISSION DATE:  07/30/2013 CONSULTATION DATE:  08/07/2013   REFERRING MD :  Kristine Garbe   CHIEF COMPLAINT:  Hypotension  INITIAL PRESENTATION: 53 year old, HIV positive admitted 7/22 with septic arthritis of right hip and abscess extending into the pelvis, underwent I&D and drainage on 7/23 followed by septic shock, hypotensive in spite of full resuscitation. 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 There is probable early osteomyelitis of the femoral head.  Associated infected iliopsoas bursa with superior extension of a  large abscess into the pelvis between the iliacus muscle and the right iliac bone.Decubitus ulcer over the lower sacrum and coccyx. 7/28 Pelvis X-ray >> aligned R-hip prosthesis  SIGNIFICANT EVENTS: 7/23 - Admit 7/23 - Right hip/pelvis, limited I and D and placement of close suction drainage intra and extra pelvic areas [name]) 7/24 - Hypovolemic shock, s/p 4 L NS bolus, 2u PRBC, pressors 7/25 - ID consulted 7/26 - Ortho discussed with Dr. [name] at [facility] (may need to be transferred) 7/27 - Returned to OR for resection R-hip d/t septic jt with tracking up iliopsoas sheath >> placed temporary prosthseis 7/28 - Remains on Dopamine 10 mcg (intermitt brady), Urinary retention >> foley placed, R-hip vac drain         - PT/OT req LTACH         - Weaning Dopamine >> start Midodrine 7/29 - Remains on Dopamine (unable to wean off d/t hypotension), cont Midodrine  ANTIBIOTICS: Ceftriaxone 7/23 >>> 7/23>>>7/29>>> Vancomycin 7/23 >>> 7/27 Zosyn 7/23 >>>7/29 Bactrim DS>>> HAART  CULTURES: Blood 7/23 >>> NGTD Urine 7/23 >>> negative Wound 7/23 >>> mod group B strep, no anaerobes R-hip synovial fluid 7/27 >>> mod WBC, rare GPC chains >>> NGTD Wound 7/27 >>> no  growth, no anaerobes C.Diff 7/28 >>> negative  LINES/TUBES: Foley 7/27 >>> R-hip wound vac drain 7/27 >>>  SUBJECTIVE: Feels better, off pressors x24 hours.  VITAL SIGNS: Temp:  [97.3 F (36.3 C)-97.8 F (36.6 C)] 97.4 F (36.3 C) (07/30 0403) Pulse Rate:  [38-85] 45 (07/30 0600) Resp:  [9-24] 9 (07/30 0600) BP: (73-119)/(42-71) 86/59 mmHg (07/30 0600) SpO2:  [94 %-100 %] 100 % (07/30 0600) Arterial Line BP: (83-148)/(27-83) 111/47 mmHg (07/30 0600)  HEMODYNAMICS: CVP:  [4 mmHg] 4 mmHg  VENTILATOR SETTINGS:   INTAKE / OUTPUT:  Intake/Output Summary (Last 24 hours) at 08/07/13 0726 Last data filed at 08/07/13 0600  Gross per 24 hour  Intake 2802.9 ml  Output   1365 ml  Net 1437.9 ml   PHYSICAL EXAMINATION: Gen - sitting up in bed, conversational, NAD Lungs: CTAB, nml effort Cardiovascular: RRR, no murmurs Abdomen: soft, NTND, +active BS Musculoskeletal: R hip/groin adhesive dressings in place and dry, wound vac in place (bloody drainage) Ext: no edema Neuro:  Awake, alert, oriented, non focal Skin:  Warm, no lesions/ rash, good cap refill, sacral decub -unstagable (at least Stage 4) 6cm x 5cm  LABS:  CBC  Recent Labs Lab 08/05/13 0445 08/06/13 0415 08/07/13 0430  WBC 6.9 7.2 5.8  HGB 8.2* 7.9* 7.2*  HCT 25.3* 23.3* 21.0*  PLT 136* 143* 149*   Coag's No results found for this basename: APTT, INR,  in the last 168 hours BMET  Recent Labs Lab 08/05/13 0445 08/06/13 0415 08/07/13 0430  NA 137 137 137  K 3.5* 3.7 3.4*  CL 105 104 106  CO2 21 20 20   BUN 12 10 14   CREATININE 0.85 0.61 0.89  GLUCOSE 86 143* 123*   Electrolytes  Recent Labs Lab 08/05/13 0445 08/06/13 0415 08/07/13 0430  CALCIUM 7.3* 7.2* 6.8*  MG 2.3 1.7 2.1  PHOS 3.4 2.1* 3.0   Sepsis Markers  Recent Labs Lab 08/01/13 1505 08/02/13 1207  LATICACIDVEN 0.8 1.0   ABG  Recent Labs Lab 08/01/13 1641 08/05/13 0350  PHART 7.305* 7.387  PCO2ART 33.9* 36.6  PO2ART  76.0* 149.0*   Liver Enzymes No results found for this basename: AST, ALT, ALKPHOS, BILITOT, ALBUMIN,  in the last 168 hours Cardiac Enzymes  Recent Labs Lab 08/02/13 1206 08/02/13 1810 08/03/13 0001  TROPONINI <0.30 <0.30 <0.30   Glucose  Recent Labs Lab 08/01/13 1143 08/01/13 1445 08/01/13 2001 08/02/13 1951  GLUCAP 92 107* 111* 110*   Imaging No results found.  ASSESSMENT / PLAN:  PULMONARY A: No acute issues Tobacco abuse P:   - Supplemental O2 PRN. - Smoking cessation.  CARDIOVASCULAR  A: Septic shock - Resolving, remains on Dopamine (weaning), Midodrine Transient sinus bradycardia P:  - Monitor BP, MAP goal >60-65, currently at goal. - D/C Dopamine. - Continue Midodrine at 10 mg PO q8 hours. - Cardiology consult to be called today - KVO IVF  RENAL A: Hypokalemia - 3.7, Resolved Concern AKI - Improved Cr, good UOP Hx urethral urinary obstruction, s/p urethral dilatation in past followed by Urology P:   - Monitor BMET, replace electrolytes. - Continue Foley.  GI A:   No acute issues P:   - Regular diet.  HEMATOLOGIC A:  Acute blood loss anemia, s/p 2u PRBC 7/24 post-op - Dec 8.2 >> 7.9 today Thrombocytopenia, chronic low nml - Stable P:  - Monitor CBC. - Transfuse to for hgb <7. - SCDs for VTE prophyalxis, post-op.  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 - Ortho following. - Monitor vac drain R-hip. - Antibiotics and cultures as above.  ENDOCRINE A: Stress dose steroids P:   - Continue hydrocortisone for stress dose, may begin to taper in the next few days if patient's BP remains stable. - Monitor glucose.  NEUROLOGIC A:  No issues P:   - PRN pain rx  - PT/OT - consider LTACH in future.  TODAY'S SUMMARY:  Dopamine off, KVO IVF, transfer to tele  and to [facility], PCCM will sign off, please call back if needed.  Rush Farmer, M.D. [facility] Pulmonary/Critical Care Medicine. Pager: [phone]. After hours pager: [phone].

## 2013-08-07 NOTE — Anesthesia Postprocedure Evaluation (Signed)
Anesthesia Post Note  Patient: [name] [name]  Procedure(s) Performed: Procedure(s) (LRB): RESECTION HIP JOINT - PLACEMENT OF CEMENT PROSTHESIS (Right)  Anesthesia type: General  Patient location: PACU  Post pain: Pain level controlled and Adequate analgesia  Post assessment: Post-op Vital signs reviewed, Patient's Cardiovascular Status Stable, Respiratory Function Stable, Patent Airway and Pain level controlled  Last Vitals:  Filed Vitals:   08/07/13 0600  BP: 86/59  Pulse: 45  Temp:   Resp: 9    Post vital signs: Reviewed and stable  Level of consciousness: awake, alert  and oriented  Complications: No apparent anesthesia complications

## 2013-08-07 NOTE — Progress Notes (Signed)
Patient ID: [name] [name], male   DOB: 02-08-1960, 53 y.o.   MRN: [id number]   SUBJECTIVE: Patient is stable today.  BP still on the low side, mostly SBP 90s.  He is on midodrine . No lightheadedness.   Scheduled Meds: . atazanavir  300 mg Oral Q breakfast  . cefTRIAXone (ROCEPHIN)  IV  2 g Intravenous Q24H  . collagenase   Topical Daily  . docusate sodium  100 mg Oral BID  . enoxaparin (LOVENOX) injection  40 mg Subcutaneous Q24H  . feeding supplement (RESOURCE BREEZE)  1 Container Oral TID BM  . fludrocortisone  0.1 mg Oral Daily  . hydrocortisone sodium succinate  50 mg Intravenous Q6H  . lamiVUDine-zidovudine  1 tablet Oral BID  . midodrine  10 mg Oral TID WC  . nystatin  5 mL Oral QID  . potassium phosphate IVPB (mmol)  30 mmol Intravenous Once  . ritonavir  100 mg Oral Q breakfast  . sodium chloride  10-40 mL Intracatheter Q12H  . sulfamethoxazole-trimethoprim  1 tablet Oral Daily  . tenofovir  300 mg Oral Daily   Continuous Infusions: . vasopressin (PITRESSIN) infusion - *FOR SHOCK*     PRN Meds:.acetaminophen, acetaminophen, atropine, feeding supplement (ENSURE COMPLETE), HYDROcodone-acetaminophen, HYDROmorphone (DILAUDID) injection, loperamide, menthol-cetylpyridinium, ondansetron (ZOFRAN) IV, ondansetron, phenol, polyethylene glycol, sodium chloride, sodium chloride    Filed Vitals:   08/07/13 1200 08/07/13 1300 08/07/13 1354 08/07/13 1400  BP: 93/53 84/51 100/57   Pulse: 52 48 54 97  Temp:   97.5 F (36.4 C)   TempSrc:   Oral   Resp: 16 15 15 13   Height:   6' 1.5" (1.867 m)   Weight:   128 lb 15.5 oz (58.5 kg)   SpO2: 100% 100% 100% 91%    Intake/Output Summary (Last 24 hours) at 08/07/13 1526 Last data filed at 08/07/13 1428  Gross per 24 hour  Intake 2192.5 ml  Output   1040 ml  Net 1152.5 ml    LABS: Basic Metabolic Panel:  Recent Labs  08/06/13 0415 08/07/13 0430  NA 137 137  K 3.7 3.4*  CL 104 106  CO2 20 20  GLUCOSE 143* 123*  BUN 10 14    CREATININE 0.61 0.89  CALCIUM 7.2* 6.8*  MG 1.7 2.1  PHOS 2.1* 3.0   Liver Function Tests: No results found for this basename: AST, ALT, ALKPHOS, BILITOT, PROT, ALBUMIN,  in the last 72 hours No results found for this basename: LIPASE, AMYLASE,  in the last 72 hours CBC:  Recent Labs  08/06/13 0415 08/07/13 0430  WBC 7.2 5.8  HGB 7.9* 7.2*  HCT 23.3* 21.0*  MCV 95.9 96.3  PLT 143* 149*   Cardiac Enzymes: No results found for this basename: CKTOTAL, CKMB, CKMBINDEX, TROPONINI,  in the last 72 hours BNP: No components found with this basename: POCBNP,  D-Dimer: No results found for this basename: DDIMER,  in the last 72 hours Hemoglobin A1C: No results found for this basename: HGBA1C,  in the last 72 hours Fasting Lipid Panel: No results found for this basename: CHOL, HDL, LDLCALC, TRIG, CHOLHDL, LDLDIRECT,  in the last 72 hours Thyroid Function Tests: No results found for this basename: TSH, T4TOTAL, FREET3, T3FREE, THYROIDAB,  in the last 72 hours Anemia Panel: No results found for this basename: VITAMINB12, FOLATE, FERRITIN, TIBC, IRON, RETICCTPCT,  in the last 72 hours  RADIOLOGY: Ct Pelvis W Contrast  07/31/2013   CLINICAL DATA:  Right hip pain. Per EPIC  notes, outside "CT scan of a right hip shows evidence of an infection around the hip and tracking into the pelvis along the iliopsoas muscle. The patient will require admission to the hospital, I indicated that he should go to the emergency room tonight or tomorrow morning and seek medical attention".  EXAM: CT PELVIS WITH CONTRAST  TECHNIQUE: Multidetector CT imaging of the pelvis was performed using the standard protocol following the bolus administration of intravenous contrast.  CONTRAST:  140mL OMNIPAQUE IOHEXOL 300 MG/ML  SOLN  COMPARISON:  Abdominal pelvic CT 05/07/2007 and 02/25/2003. Outside study is unavailable.  FINDINGS: There is a large complex right hip joint effusion with fluid tracking into the iliopsoas  bursa. There is a small amount of air anteriorly within the joint on image 85. Complex fluid within the iliopsoas bursa extends superiorly along the right iliacus muscle, measuring up to 8.9 x 5.2 cm transverse on image 48.  Since the prior CT, the patient has developed marked right hip joint space loss with multifocal destruction of the acetabulum consistent with osteomyelitis. The femoral head demonstrates mild rarefaction without gross cortical destruction or subchondral collapse. There is no dislocation. Apart from the acetabulum, there is no evidence of pelvic osteomyelitis. The sacroiliac joints and lower lumbar spine appear intact.  There is decubitus ulceration over the lower sacrum and coccyx with associated soft tissue emphysema. There is no gross destruction of the sacrum or coccyx. There appears to be some induration of the skin posterolateral to the right femoral greater trochanter.  Chronic bladder wall thickening appear stable.  IMPRESSION: 1. 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 There is probable early osteomyelitis of the femoral head. 2. Associated infected iliopsoas bursa with superior extension of a large abscess into the pelvis between the iliacus muscle and the right iliac bone. 3. Decubitus ulcer over the lower sacrum and coccyx.   Electronically Signed   By: Camie Patience M.D.   On: 07/31/2013 02:42   Dg Pelvis Portable  08/05/2013   CLINICAL DATA:  Post right hip surgery  EXAM: PORTABLE PELVIS 1-2 VIEWS  COMPARISON:  None.  FINDINGS: Single frontal view of the pelvis submitted. There is right hip prosthesis in anatomic alignment. Small amount of periarticular soft tissue air. Lateral skin staples. Postsurgical drain in place.  IMPRESSION: Right hip prosthesis in anatomic alignment.   Electronically Signed   By: Lahoma Crocker M.D.   On: 08/05/2013 07:59   Dg Chest Port 1 View  08/01/2013   CLINICAL DATA:  Central venous line  placement.  EXAM: PORTABLE CHEST - 1 VIEW  COMPARISON:  08/01/2013.  FINDINGS: Central line noted with its tip projected over the cavoatrial junction. Mediastinum and hilar structures are normal.Borderline cardiomegaly, slight prominence of pulmonary vascularity. Very subtle Kerley B-lines are noted. Very mild congestive heart failure cannot be excluded. No pleural effusion or pneumothorax. No acute bony abnormality.  IMPRESSION: 1. Central line in good anatomic position. 2. Can't exclude mild congestive heart failure.   Electronically Signed   By: Marcello Moores  Register   On: 08/01/2013 15:16   Dg Chest Port 1 View  08/01/2013   CLINICAL DATA:  Postop  EXAM: PORTABLE CHEST - 1 VIEW  COMPARISON:  12/24/2012  FINDINGS: Heart, mediastinum and hila are unremarkable. Lungs are hyperexpanded with mild interstitial prominence and evidence of apical emphysema. No lung consolidation or edema. No pleural effusion or pneumothorax.  Bony thorax is intact.  IMPRESSION: No acute cardiopulmonary  disease.   Electronically Signed   By: Lajean Manes M.D.   On: 08/01/2013 09:10    PHYSICAL EXAM General: NAD Neck: No JVD, no thyromegaly or thyroid nodule.  Lungs: Clear to auscultation bilaterally with normal respiratory effort. CV: Nondisplaced PMI.  Heart regular S1/S2, no S3/S4, no murmur.  No peripheral edema.   Abdomen: Soft, nontender, no hepatosplenomegaly, no distention.  Neurologic: Alert and oriented x 3.  Psych: Normal affect. Extremities: No clubbing or cyanosis.   TELEMETRY: Reviewed telemetry pt in NSR in 62s  ASSESSMENT AND PLAN: 53 yo with history of HIV presented with septic arthritis right hip that spread into the bone (osteomyelitis).  He developed septic shock.  Cardiology asked to evaluate because of persistent hypotension.  He is now off dopamine and on midodrine.  SBP in 90s currently.  He remains on hydrocortisone.  Suspect this will improve with time (recent septic shock, recovering).  Would place  ted hose and follow.  Loralie Champagne 08/07/2013

## 2013-08-07 NOTE — Progress Notes (Signed)
Physical Therapy Treatment Patient Details Name: [name] [name] MRN: [id number] DOB: 12-31-1960 Today's Date: 08/07/2013    History of Present Illness 53 year old, HIV positive admitted 7/22 with septic arthritis of right hip and abscess extending into the pelvis, underwent I&D and drainage on 7/23 followed by septic shock, hypotensive in spite of full resuscitation. Hence PCCM consulted 7/24. Had hip resection on 08/04/13 with cement placement. Has posterior hip precautions and is PWB right LE.     PT Comments    Pt progressing with mobility but limited by pain and fatigue after pericare and dressing change due to incontinent stool. Pt with loose stool saturating sacral dressing with periarea cleaned and area washed with normal saline syringe to clear stool from wound. RN present and assisting with dressing change with necrotic tissue present on sacrum with blue/green exudate and dressing applied with santyl, wet to dry 4x4 and skin prep, tape reapplied with RN direction for orders and assist. Pt transferred to chair with cues for sequence with pt and RN educated for sequence and precautions for return to bed. Pt encouraged to perform pressure relief in chair although seated on pillows. Will continue to follow.    Follow Up Recommendations  LTACH;Supervision/Assistance - 24 hour     Equipment Recommendations       Recommendations for Other Services       Precautions / Restrictions Precautions Precautions: Posterior Hip;Fall Precaution Comments: pt able to verbalize 3/3 post hip precautions but needs assist to maintain with mobility Restrictions RLE Weight Bearing: Partial weight bearing RLE Partial Weight Bearing Percentage or Pounds: 50 Other Position/Activity Restrictions: pt with low BP    Mobility  Bed Mobility Overal bed mobility: Needs Assistance;+ 2 for safety/equipment Bed Mobility: Rolling;Sidelying to Sit Rolling: Min assist Sidelying to sit: +2 for physical  assistance;Mod assist       General bed mobility comments: cues for sequence, with pillow between knees with use of rail to roll and extended time in sidelying for pericare and dressing change. Side to sit with assist to bring legs off of bed maintaining precautions and elevate trunk with max cues  Transfers   Equipment used: Rolling walker (2 wheeled) Transfers: Sit to/from [facility] Sit to Stand: Min assist;+2 safety/equipment Stand pivot transfers: Min assist;+2 safety/equipment       General transfer comment: cues for hand placement, RLE position, posture sequence and safety with assist to maneuver RW and max cues for precautions  Ambulation/Gait                 Stairs            Wheelchair Mobility    Modified Rankin (Stroke Patients Only)       Balance Overall balance assessment: Needs assistance   Sitting balance-Leahy Scale: Fair       Standing balance-Leahy Scale: Poor                      Cognition Arousal/Alertness: Awake/alert Behavior During Therapy: WFL for tasks assessed/performed Overall Cognitive Status: Within Functional Limits for tasks assessed                      Exercises      General Comments        Pertinent Vitals/Pain 8/10 sacrum with pericare 6/10 right hip, repositioned and RN medicated    Home Living  Prior Function            PT Goals (current goals can now be found in the care plan section) Progress towards PT goals: Progressing toward goals    Frequency  Min 3X/week    PT Plan Current plan remains appropriate    Co-evaluation             End of Session   Activity Tolerance: Patient limited by pain Patient left: in chair;with call bell/phone within reach;with nursing/sitter in room     Time: 1779-3903 PT Time Calculation (min): 45 min  Charges:  $Therapeutic Activity: 23-37 mins $Self Care/Home Management: 8-22                     G Codes:      Melford Aase 08/09/2013, 9:44 AM Elwyn Reach, [place]

## 2013-08-07 NOTE — Progress Notes (Signed)
Peripherally Inserted Central Catheter/Midline Placement  The IV Nurse has discussed with the patient and/or persons authorized to consent for the patient, the purpose of this procedure and the potential benefits and risks involved with this procedure.  The benefits include less needle sticks, lab draws from the catheter and patient may be discharged home with the catheter.  Risks include, but not limited to, infection, bleeding, blood clot (thrombus formation), and puncture of an artery; nerve damage and irregular heat beat.  Alternatives to this procedure were also discussed.  PICC/Midline Placement Documentation        [name] [name] 08/07/2013, 12:20 PM

## 2013-08-07 NOTE — Progress Notes (Signed)
Occupational Therapy Treatment Patient Details Name: [name] [name] MRN: [id number] DOB: 09-12-1960 Today's Date: 08/07/2013    History of present illness 53 year old, HIV positive admitted 7/22 with septic arthritis of right hip and abscess extending into the pelvis, underwent I&D and drainage on 7/23 followed by septic shock, hypotensive in spite of full resuscitation. Hence PCCM consulted 7/24. Had hip resection on 08/04/13 with cement placement. Has posterior hip precautions and is PWB right LE.    OT comments  Pt assisted back to bed for PICC line placement.  Pt requires mod verbal and tactile cue to maintain posterior THA precautions   Follow Up Recommendations  LTACH;Supervision/Assistance - 24 hour    Equipment Recommendations  3 in 1 bedside comode;Tub/shower seat    Recommendations for Other Services      Precautions / Restrictions Precautions Precautions: Posterior Hip;Fall Precaution Booklet Issued: Yes (comment) Precaution Comments: reviewed posterior THA precautions with pt - requires mod cues and assist to maintain  Restrictions Weight Bearing Restrictions: Yes RLE Weight Bearing: Partial weight bearing RLE Partial Weight Bearing Percentage or Pounds: 50 Other Position/Activity Restrictions: pt with low BP       Mobility Bed Mobility Overal bed mobility: Needs Assistance;+2 for physical assistance Bed Mobility: Sit to Supine Rolling: Min assist Sidelying to sit: +2 for physical assistance;Mod assist   Sit to supine: Mod assist;+2 for physical assistance   General bed mobility comments: assist to lower trunk and assist to lift LEs onto bed.  Pt requires step by step cues for sequencing and precautions   Transfers Overall transfer level: Needs assistance Equipment used: Rolling walker (2 wheeled) Transfers: Sit to/from [facility] Sit to Stand: Min assist;+2 physical assistance Stand pivot transfers: Min assist;+2 safety/equipment        General transfer comment: cues for hand placement and Rt. LE position.  Assist to maneuver RW and max cues for precautions - pt tends to adduct Hip     Balance Overall balance assessment: Needs assistance Sitting-balance support: Bilateral upper extremity supported;Feet supported Sitting balance-Leahy Scale: Fair     Standing balance support: Bilateral upper extremity supported Standing balance-Leahy Scale: Poor                     ADL                           Toilet Transfer: Moderate assistance;+2 for physical assistance;Stand-pivot   Toileting- Clothing Manipulation and Hygiene: Total assistance;Sit to/from stand       Functional mobility during ADLs: Moderate assistance;+2 for physical assistance;Rolling walker General ADL Comments: Pt assisted back to bed with assist of RN for PICC line placement.        Vision                     Perception     Praxis      Cognition   Behavior During Therapy: WFL for tasks assessed/performed Overall Cognitive Status: Within Functional Limits for tasks assessed                       Extremity/Trunk Assessment               Exercises     Shoulder Instructions       General Comments      Pertinent Vitals/ Pain       See vitals flow sheet  Home Living  Prior Functioning/Environment              Frequency Min 2X/week     Progress Toward Goals  OT Goals(current goals can now be found in the care plan section)     Acute Rehab OT Goals Patient Stated Goal: to go home OT Goal Formulation: With patient Time For Goal Achievement: 08/19/13 Potential to Achieve Goals: Good ADL Goals Pt Will Perform Grooming: with min assist;standing Pt Will Perform Lower Body Bathing: with min assist;with adaptive equipment;sit to/from stand Pt Will Perform Lower Body Dressing: with min assist;with adaptive equipment;sit to/from  stand Pt Will Transfer to Toilet: with min assist;ambulating;regular height toilet;grab bars;bedside commode Pt Will Perform Toileting - Clothing Manipulation and hygiene: with min assist;sit to/from stand Pt/caregiver will Perform Home Exercise Program: Increased strength;Right Upper extremity;Left upper extremity;With theraband;With written HEP provided  Plan Discharge plan remains appropriate    Co-evaluation                 End of Session Equipment Utilized During Treatment: Rolling walker   Activity Tolerance Other (comment) (due to procedure)   Patient Left in bed;with call bell/phone within reach;with nursing/sitter in room   Nurse Communication Mobility status;Other (comment)        Time: 1100-1110 OT Time Calculation (min): 10 min  Charges: OT General Charges $OT Visit: 1 Procedure OT Treatments $Therapeutic Activity: 8-22 mins  Khaleesi Gruel M 08/07/2013, 1:17 PM

## 2013-08-08 DIAGNOSIS — Z85048 Personal history of other malignant neoplasm of rectum, rectosigmoid junction, and anus: Secondary | ICD-10-CM

## 2013-08-08 DIAGNOSIS — Z86718 Personal history of other venous thrombosis and embolism: Secondary | ICD-10-CM

## 2013-08-08 DIAGNOSIS — L89109 Pressure ulcer of unspecified part of back, unspecified stage: Secondary | ICD-10-CM

## 2013-08-08 DIAGNOSIS — D539 Nutritional anemia, unspecified: Secondary | ICD-10-CM

## 2013-08-08 DIAGNOSIS — L89209 Pressure ulcer of unspecified hip, unspecified stage: Secondary | ICD-10-CM

## 2013-08-08 LAB — BASIC METABOLIC PANEL
ANION GAP: 11 (ref 5–15)
BUN: 17 mg/dL (ref 6–23)
CHLORIDE: 106 meq/L (ref 96–112)
CO2: 22 mEq/L (ref 19–32)
Calcium: 7 mg/dL — ABNORMAL LOW (ref 8.4–10.5)
Creatinine, Ser: 0.88 mg/dL (ref 0.50–1.35)
GFR calc Af Amer: >90 mL/min (ref >90)
GFR calc non Af Amer: >90 mL/min (ref >90)
GLUCOSE: 120 mg/dL — AB (ref 70–99)
Potassium: 3.9 mEq/L (ref 3.7–5.3)
Sodium: 139 mEq/L (ref 137–147)

## 2013-08-08 LAB — CBC
HCT: 20.5 % — ABNORMAL LOW (ref 39.0–52.0)
HEMATOCRIT: 18.6 % — AB (ref 39.0–52.0)
HEMOGLOBIN: 6.2 g/dL — AB (ref 13.0–17.0)
Hemoglobin: 7 g/dL — ABNORMAL LOW (ref 13.0–17.0)
MCH: 32.5 pg (ref 26.0–34.0)
MCH: 32.9 pg (ref 26.0–34.0)
MCHC: 33.3 g/dL (ref 30.0–36.0)
MCHC: 34.1 g/dL (ref 30.0–36.0)
MCV: 97.4 fL (ref 78.0–100.0)
MCV: 96.2 fL (ref 78.0–100.0)
Platelets: 130 10*3/uL — ABNORMAL LOW (ref 150–400)
Platelets: 124 10*3/uL — ABNORMAL LOW (ref 150–400)
RBC: 1.91 MIL/uL — AB (ref 4.22–5.81)
RBC: 2.13 MIL/uL — ABNORMAL LOW (ref 4.22–5.81)
RDW: 16.8 % — ABNORMAL HIGH (ref 11.5–15.5)
RDW: 16.7 % — ABNORMAL HIGH (ref 11.5–15.5)
WBC: 4.8 10*3/uL (ref 4.0–10.5)
WBC: 5.4 10*3/uL (ref 4.0–10.5)

## 2013-08-08 LAB — HLA B*5701: HLA B 5701: NEGATIVE

## 2013-08-08 LAB — MAGNESIUM: Magnesium: 1.8 mg/dL (ref 1.5–2.5)

## 2013-08-08 LAB — PHOSPHORUS: Phosphorus: 2.7 mg/dL (ref 2.3–4.6)

## 2013-08-08 LAB — PREPARE RBC (CROSSMATCH)

## 2013-08-08 MED ORDER — ENSURE COMPLETE PO LIQD
237.0000 mL | Freq: Three times a day (TID) | ORAL | Status: DC
  Administered 2013-08-08 – 2013-08-13 (×9): 237 mL via ORAL

## 2013-08-08 MED ORDER — SODIUM CHLORIDE 0.9 % IV SOLN
Freq: Once | INTRAVENOUS | Status: AC
  Administered 2013-08-08: 07:00:00 via INTRAVENOUS

## 2013-08-08 NOTE — Progress Notes (Signed)
Subjective: Improving.  No SOB or CP.Marland Kitchen He does report dizziness during PT yesterday.   Objective: Vital signs in last 24 hours: Temp:  [97.4 F (36.3 C)-97.8 F (36.6 C)] 97.4 F (36.3 C) (07/31 0758) Pulse Rate:  [37-97] 46 (07/31 0758) Resp:  [8-33] 21 (07/31 0758) BP: (84-135)/(51-67) 133/67 mmHg (07/31 0758) SpO2:  [91 %-100 %] 100 % (07/31 0758) Weight:  [128 lb 15.5 oz (58.5 kg)] 128 lb 15.5 oz (58.5 kg) (07/30 1354) Last BM Date: 08/07/13  Intake/Output from previous day: 07/30 0701 - 07/31 0700 In: 1070 [P.O.:410; I.V.:10; IV Piggyback:560] Out: 450 [Urine:450] Intake/Output this shift:    Medications Current Facility-Administered Medications  Medication Dose Route Frequency Provider Last Rate Last Dose  . 0.9 %  sodium chloride infusion   Intravenous Once Alexa Richardson, MD      . acetaminophen (TYLENOL) tablet 650 mg  650 mg Oral Q6H PRN [name], MD       Or  . acetaminophen (TYLENOL) suppository 650 mg  650 mg Rectal Q6H PRN [name], MD      . atazanavir (REYATAZ) capsule 300 mg  300 mg Oral Q breakfast Corky Sox, MD   300 mg at 08/07/13 0605  . atropine injection 0.4 mg  0.4 mg Intravenous PRN Madilyn Fireman, MD      . cefTRIAXone (ROCEPHIN) 2 g in dextrose 5 % 50 mL IVPB  2 g Intravenous Q24H Michel Bickers, MD   2 g at 08/07/13 2044  . collagenase (SANTYL) ointment   Topical Daily Madilyn Fireman, MD   1 application at 90/24/09 1052  . docusate sodium (COLACE) capsule 100 mg  100 mg Oral BID [name], MD   100 mg at 08/07/13 2210  . enoxaparin (LOVENOX) injection 40 mg  40 mg Subcutaneous Q24H [name], MD   40 mg at 08/07/13 1055  . feeding supplement (ENSURE COMPLETE) (ENSURE COMPLETE) liquid 237 mL  237 mL Oral BID PRN Dalene Carrow, RD      . feeding supplement (RESOURCE BREEZE) (RESOURCE BREEZE) liquid 1 Container  1 Container Oral TID BM Dalene Carrow, RD   1 Container at 08/07/13 2032  .  fludrocortisone (FLORINEF) tablet 0.1 mg  0.1 mg Oral Daily Darlin Coco, MD   0.1 mg at 08/08/13 0917  . HYDROcodone-acetaminophen (NORCO/VICODIN) 5-325 MG per tablet 1-2 tablet  1-2 tablet Oral Q4H PRN [name], MD   2 tablet at 08/06/13 1739  . hydrocortisone sodium succinate (SOLU-CORTEF) 100 MG injection 50 mg  50 mg Intravenous Q6H Rush Farmer, MD   50 mg at 08/08/13 0830  . HYDROmorphone (DILAUDID) injection 0.5-1 mg  0.5-1 mg Intravenous Q3H PRN [name], MD   1 mg at 08/08/13 0916  . lamiVUDine-zidovudine (COMBIVIR) 150-300 MG per tablet 1 tablet  1 tablet Oral BID Corky Sox, MD   1 tablet at 08/08/13 [phone]  . loperamide (IMODIUM) capsule 2 mg  2 mg Oral QID PRN Doree Fudge, MD   2 mg at 08/06/13 2149  . menthol-cetylpyridinium (CEPACOL) lozenge 3 mg  1 lozenge Oral PRN [name], MD       Or  . phenol [facility]) mouth spray 1 spray  1 spray Mouth/Throat PRN [name], MD      . midodrine (PROAMATINE) tablet 10 mg  10 mg Oral TID WC Rush Farmer, MD   10 mg at 08/08/13 0830  .  nystatin (MYCOSTATIN) 100000 UNIT/ML suspension 500,000 Units  5 mL Oral QID Kelby Aline, MD   500,000 Units at 08/08/13 [phone]  . ondansetron (ZOFRAN) tablet 4 mg  4 mg Oral Q6H PRN [name], MD       Or  . ondansetron [facility]) injection 4 mg  4 mg Intravenous Q6H PRN [name], MD   4 mg at 08/08/13 [phone]  . polyethylene glycol (MIRALAX / GLYCOLAX) packet 17 g  17 g Oral Daily PRN [name], MD      . ritonavir (NORVIR) tablet 100 mg  100 mg Oral Q breakfast Corky Sox, MD   100 mg at 08/08/13 0745  . sodium chloride 0.9 % bolus 1,000 mL  1,000 mL Intravenous PRN Erick Colace, NP   1,000 mL at 08/01/13 2104  . sodium chloride 0.9 % injection 10-40 mL  10-40 mL Intracatheter Q12H [name], MD   10 mL at 08/07/13 2211  . sodium chloride 0.9 % injection 10-40 mL  10-40 mL Intracatheter PRN [name], MD      .  sulfamethoxazole-trimethoprim (BACTRIM DS) 800-160 MG per tablet 1 tablet  1 tablet Oral Daily Kelby Aline, MD   1 tablet at 08/08/13 [phone]  . tenofovir (VIREAD) tablet 300 mg  300 mg Oral Daily Corky Sox, MD   300 mg at 08/07/13 1051    PE: General appearance: alert, cooperative and no distress Lungs: clear to auscultation bilaterally Heart: regular rate and rhythm, S1, S2 normal, no murmur, click, rub or gallop Abdomen: +BS  Extremities: No LEE Pulses: 2+ and symmetric Skin: Warm and dry Neurologic: Grossly normal  Lab Results:   Recent Labs  08/06/13 0415 08/07/13 0430 08/08/13 0440  WBC 7.2 5.8 4.8  HGB 7.9* 7.2* 6.2*  HCT 23.3* 21.0* 18.6*  PLT 143* 149* 130*   BMET  Recent Labs  08/06/13 0415 08/07/13 0430 08/08/13 0440  NA 137 137 139  K 3.7 3.4* 3.9  CL 104 106 106  CO2 20 20 22   GLUCOSE 143* 123* 120*  BUN 10 14 17   CREATININE 0.61 0.89 0.88  CALCIUM 7.2* 6.8* 7.0*      Assessment/Plan   Principal Problem:   Septic arthritis of hip Active Problems:   HIV DISEASE   ANEMIA, CHRONIC   Decubitus ulcer of sacral region, stage 4   Protein-calorie malnutrition, severe   Septic shock(785.52)   Hypotension  Plan:  POD# 4: Resection of right hip joint, placement of an antibiotic- laden total hip arthroplasty, cemented an acetabular and femoral components.  We were asked to see for hypotension.  BP improved(133/67) the last two checks.  Echo this admission:  EF 55-60% with normal wall motion.  Sinus brady on telemetry, as low the 40's and currently stable in the 50's.   He had an acute drop in Hgb since yesterday-7.2>>6.2.   Transfusion ordered by primary.  This should continue to improve BP.      LOS: 9 days    [name] [name], [name] [name] 08/08/2013 10:12 AM  I have personally seen and examined this patient with [name] Fuller, [name] [name]. I agree with the assessment and plan as outlined above. Hypotension resolved. No other recommendations. Will sign off. Please  call with questions.   [name] [name] 08/08/2013 10:52 AM

## 2013-08-08 NOTE — Consult Note (Signed)
[facility] Surgery Consult Note  [name] [name] 1960-02-03  [id number].    Requesting MD: Dr. Ellwood Dense Chief Complaint/Reason for Consult: Stage 4 decubitus ulcer  HPI:  53 year old HIV-positive thin white male admitted 07/30/13 with septic arthritis of the right hip and abscess extending into the pelvis. 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  Persistent hypotension following incision and drainage of the abscess, he was transferred to the unit and managed by CCM.  He has since come out of the unit and is being managed by the internal medicine teaching service.  We have been consulted because he has developed a stage 4 sacral decubitus ulcer.  He has some pain and inflammation around it.  He's been pretty much in bed except for with rehab since his hip surgery.  No more fevers/chills, N/V/D, abdominal pain, CP/SOB.  Never had this before.     ROS: All systems reviewed and otherwise negative except for as above  Family History  Problem Relation Age of Onset  . Diabetes Mother   . Dementia Mother   . Atrial fibrillation Mother   . Hypertension Mother   . Cancer Father   . Emphysema Father     Past Medical History  Diagnosis Date  . Radiation   . HIV (human immunodeficiency virus infection)   . Shingles     left shoulder, right leg  . Pain in limb 07/23/2013  . Rectal cancer     Squamous cell  . Gout     Past Surgical History  Procedure Laterality Date  . Hernia repair      right  . Hematoma evacuation    . Incision and drainage hip Right 07/31/2013    Procedure: IRRIGATION AND DEBRIDEMENT HIP WITH PERCUTANEOUS DRAIN PLACEMENT;  Surgeon: [name], MD;  Location: [place];  Service: Orthopedics;  Laterality: Right;  . Total hip arthroplasty Right 08/04/2013    Procedure: RESECTION HIP JOINT - PLACEMENT OF CEMENT PROSTHESIS;  Surgeon: [name], MD;  Location: [place];  Service: Orthopedics;  Laterality: Right;    Social History:  reports  that he has been smoking Cigarettes.  He has been smoking about 0.30 packs per day. He has never used smokeless tobacco. He reports that he drinks about 1.5 ounces of alcohol per week. He reports that he uses illicit drugs (Marijuana).  Allergies: No Known Allergies  Medications Prior to Admission  Medication Sig Dispense Refill  . acetaminophen (TYLENOL) 500 MG tablet Take 1,000 mg by mouth every 6 (six) hours as needed for mild pain or moderate pain.       Marland Kitchen atazanavir (REYATAZ) 300 MG capsule Take 300 mg by mouth daily with breakfast.      . HYDROmorphone (DILAUDID) 2 MG tablet Take 1 tablet (2 mg total) by mouth every 4 (four) hours as needed for severe pain.  90 tablet  0  . lamiVUDine-zidovudine (COMBIVIR) 150-300 MG per tablet Take 1 tablet by mouth 2 (two) times daily.  60 tablet  6  . ritonavir (NORVIR) 100 MG TABS tablet Take 1 tablet (100 mg total) by mouth daily.  30 tablet  6  . tenofovir (VIREAD) 300 MG tablet Take 1 tablet (300 mg total) by mouth daily.  30 tablet  6    Blood pressure 105/64, pulse 44, temperature 98 F (36.7 C), temperature source Oral, resp. rate 20, height 6' 1.5" (1.867 m), weight 128 lb 15.5 oz (58.5 kg), SpO2 100.00%. Physical Exam: General: pleasant, very  thin WD/WN white male who is laying in bed in NAD HEENT: head is normocephalic, atraumatic.  Sclera are noninjected.  PERRL.  Ears and nose without any masses or lesions.  Mouth is pink and moist Heart: regular, rate, and rhythm.  No obvious murmurs, gallops, or rubs noted.  Palpable pedal pulses bilaterally Lungs: CTAB, no wheezes, rhonchi, or rales noted.  Respiratory effort nonlabored Abd: thin, soft, NT/ND, +BS, no masses, hernias, or organomegaly, RLQ incisional scar which is depressed from the rest of the skin.   MS: all 4 extremities are symmetrical with no cyanosis, clubbing, or edema. Skin: very pale, but warm and dry.  Sacral decub is 9cm long by 5cm wide with 85% hard black eschar and 15% is  unroofed and pink underneath.  Blue/green staining (pseudomonas) on the 4x4cm gauze dressings.   Psych: A&Ox3 with an appropriate affect.   Results for orders placed during the hospital encounter of 07/30/13 (from the past 48 hour(s))  CBC     Status: Abnormal   Collection Time    08/07/13  4:30 AM      Result Value Ref Range   WBC 5.8  4.0 - 10.5 K/uL   RBC 2.18 (*) 4.22 - 5.81 MIL/uL   Hemoglobin 7.2 (*) 13.0 - 17.0 g/dL   HCT 21.0 (*) 39.0 - 52.0 %   MCV 96.3  78.0 - 100.0 fL   MCH 33.0  26.0 - 34.0 pg   MCHC 34.3  30.0 - 36.0 g/dL   RDW 16.5 (*) 11.5 - 15.5 %   Platelets 149 (*) 150 - 400 K/uL  BASIC METABOLIC PANEL     Status: Abnormal   Collection Time    08/07/13  4:30 AM      Result Value Ref Range   Sodium 137  137 - 147 mEq/L   Potassium 3.4 (*) 3.7 - 5.3 mEq/L   Chloride 106  96 - 112 mEq/L   CO2 20  19 - 32 mEq/L   Glucose, Bld 123 (*) 70 - 99 mg/dL   BUN 14  6 - 23 mg/dL   Creatinine, Ser 0.89  0.50 - 1.35 mg/dL   Calcium 6.8 (*) 8.4 - 10.5 mg/dL   GFR calc non Af Amer >90  >90 mL/min   GFR calc Af Amer >90  >90 mL/min   Comment: (NOTE)     The eGFR has been calculated using the CKD EPI equation.     This calculation has not been validated in all clinical situations.     eGFR's persistently <90 mL/min signify possible Chronic Kidney     Disease.   Anion gap 11  5 - 15  MAGNESIUM     Status: None   Collection Time    08/07/13  4:30 AM      Result Value Ref Range   Magnesium 2.1  1.5 - 2.5 mg/dL  PHOSPHORUS     Status: None   Collection Time    08/07/13  4:30 AM      Result Value Ref Range   Phosphorus 3.0  2.3 - 4.6 mg/dL  CBC     Status: Abnormal   Collection Time    08/08/13  4:40 AM      Result Value Ref Range   WBC 4.8  4.0 - 10.5 K/uL   RBC 1.91 (*) 4.22 - 5.81 MIL/uL   Hemoglobin 6.2 (*) 13.0 - 17.0 g/dL   Comment: REPEATED TO VERIFY     CRITICAL RESULT CALLED  TO, READ BACK BY AND VERIFIED WITH:     R OUSTERMAN,RN 0459 08/08/13 D BRADLEY    HCT 18.6 (*) 39.0 - 52.0 %   MCV 97.4  78.0 - 100.0 fL   MCH 32.5  26.0 - 34.0 pg   MCHC 33.3  30.0 - 36.0 g/dL   RDW 16.8 (*) 11.5 - 15.5 %   Platelets 130 (*) 150 - 400 K/uL  BASIC METABOLIC PANEL     Status: Abnormal   Collection Time    08/08/13  4:40 AM      Result Value Ref Range   Sodium 139  137 - 147 mEq/L   Potassium 3.9  3.7 - 5.3 mEq/L   Chloride 106  96 - 112 mEq/L   CO2 22  19 - 32 mEq/L   Glucose, Bld 120 (*) 70 - 99 mg/dL   BUN 17  6 - 23 mg/dL   Creatinine, Ser 0.88  0.50 - 1.35 mg/dL   Calcium 7.0 (*) 8.4 - 10.5 mg/dL   GFR calc non Af Amer >90  >90 mL/min   GFR calc Af Amer >90  >90 mL/min   Comment: (NOTE)     The eGFR has been calculated using the CKD EPI equation.     This calculation has not been validated in all clinical situations.     eGFR's persistently <90 mL/min signify possible Chronic Kidney     Disease.   Anion gap 11  5 - 15  MAGNESIUM     Status: None   Collection Time    08/08/13  4:40 AM      Result Value Ref Range   Magnesium 1.8  1.5 - 2.5 mg/dL  PHOSPHORUS     Status: None   Collection Time    08/08/13  4:40 AM      Result Value Ref Range   Phosphorus 2.7  2.3 - 4.6 mg/dL  PREPARE RBC (CROSSMATCH)     Status: None   Collection Time    08/08/13  7:30 AM      Result Value Ref Range   Order Confirmation ORDER PROCESSED BY BLOOD BANK    TYPE AND SCREEN     Status: None   Collection Time    08/08/13  7:30 AM      Result Value Ref Range   ABO/RH(D) B POS     Antibody Screen NEG     Sample Expiration 08/11/2013     Unit Number [id number]     Blood Component Type RED CELLS,LR     Unit division 00     Status of Unit ISSUED     Transfusion Status OK TO TRANSFUSE     Crossmatch Result Compatible     No results found.    Assessment/Plan Stage 4 Decubitus ulcer Pseudomonas colonization Septic arthritis right hip s/p resection hip joint, placement of cement prosthesis Osteomyolitis HIV H/o SCC Anus 2008 H/o DVT ABL anemia  - pending 1 unit blood administration  Plan: 1.  WD dressing changes BID, may need dakins solution as there apparently seems to be pseudomonas growth 2.  NPO after midnight, for possible debridement of his sacral ulcer tomorrow if medically stable 3.  Does not appear to be on any blood thinners   Coralie Keens, [facility] Surgery 08/08/2013, 12:52 PM Pager: [phone]

## 2013-08-08 NOTE — Progress Notes (Signed)
Subjective: 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 The patient initially presented with R hip and leg pain. 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 He had a limited I&D and placement of close suction drainage on 7/23 and went into hypovolemic shock on 7/24. He received 4 L NS, 2 u PRBC, and pressors and was transferred to the ICU. He returned to the OR for resection of his R hip on 7/27 and orthopedics placed a temporary prosthesis. His abscess culture grew group B strep and his broad spectrum vancomycin/zosyn was discontinued on 7/29 with ceftriaxone maintained. He is followed by ID who recommends 6 weeks of antibiotics. He was weaned off dopamine and the IMTP team resumed care this morning.  [name] had no complaints on interview and exam. He was in good spirits. He said he feels a little lightheaded and dizzy when he participates in PT/OT. He also endorses 6/10 R hip pain and some stiffness in his R leg that he keeps flexed to relieve pain. He also requested assistance with a form to defer loan payments due to hospitalization. He otherwise denied chest pain, SOB, nausea, emesis, fevers, chills, or night sweats.    Objective: Vital signs in last 24 hours: Filed Vitals:   08/08/13 0200 08/08/13 0400 08/08/13 0600 08/08/13 0758  BP: 115/67 102/65 133/67 133/67  Pulse: 46 37 40 46  Temp:  97.5 F (36.4 C)  97.4 F (36.3 C)  TempSrc:  Oral  Oral  Resp: 23 33 20 21  Height:      Weight:      SpO2: 99% 100% 100% 100%   Weight change:   Intake/Output Summary (Last 24 hours) at 08/08/13 1151 Last data filed at 08/08/13 1000  Gross per 24 hour  Intake    740 ml  Output    251 ml  Net    489 ml   BP 133/67  Pulse 46  Temp(Src) 97.4 F (36.3 C)  (Oral)  Resp 21  Ht 6' 1.5" (1.867 m)  Wt 128 lb 15.5 oz (58.5 kg)  BMI 16.78 kg/m2  SpO2 100%  General Appearance:    Alert, cooperative, no distress, cachetic  HEENT:    Normocephalic, without obvious abnormality, atraumatic, PERRL, EOMI, MMM  Back:     Symmetric, no curvature, R lower back decubitus ulcer well bandaged  Lungs:     Clear to auscultation bilaterally, respirations unlabored  Chest wall:    No tenderness or deformity  Heart:    Regular rate and rhythm, S1 and S2 normal, no murmur, rub   or gallop  Abdomen:     Soft, non-tender, bowel sounds active all four quadrants,    no masses, no organomegaly, R groin scar from previous hernia surgery  Extremities:   R lateral hip and upper leg with several incisions well bandaged. Bandages with dried serosanguinous fluid. No cyanosis or edema in feet.  Pulses:   2+ and symmetric radial and DP pulses   Lab Results: Basic Metabolic Panel:  Recent Labs Lab 08/07/13 0430 08/08/13 0440  NA 137 139  K 3.4* 3.9  CL 106 106  CO2 20 22  GLUCOSE 123* 120*  BUN 14 17  CREATININE 0.89 0.88  CALCIUM 6.8* 7.0*  MG 2.1 1.8  PHOS 3.0 2.7   Liver Function Tests: No results found for this basename: AST, ALT, ALKPHOS, BILITOT, PROT, ALBUMIN,  in the last 168 hours No results found for this basename: LIPASE, AMYLASE,  in the last 168 hours No results found for this basename: AMMONIA,  in the last 168 hours CBC:  Recent Labs Lab 08/01/13 1505  08/07/13 0430 08/08/13 0440  WBC 12.2*  < > 5.8 4.8  NEUTROABS 11.0*  --   --   --   HGB 9.0*  < > 7.2* 6.2*  HCT 27.2*  < > 21.0* 18.6*  MCV 98.2  < > 96.3 97.4  PLT 145*  < > 149* 130*  < > = values in this interval not displayed. Cardiac Enzymes:  Recent Labs Lab 08/02/13 1206 08/02/13 1810 08/03/13 0001  TROPONINI <0.30 <0.30 <0.30   BNP: No results found for this basename: PROBNP,  in the last 168 hours D-Dimer: No results found for this basename: DDIMER,  in the last 168  hours CBG:  Recent Labs Lab 08/01/13 1445 08/01/13 2001 08/02/13 1951  GLUCAP 107* 111* 110*   Hemoglobin A1C: No results found for this basename: HGBA1C,  in the last 168 hours Fasting Lipid Panel: No results found for this basename: CHOL, HDL, LDLCALC, TRIG, CHOLHDL, LDLDIRECT,  in the last 168 hours Thyroid Function Tests: No results found for this basename: TSH, T4TOTAL, FREET4, T3FREE, THYROIDAB,  in the last 168 hours Coagulation: No results found for this basename: LABPROT, INR,  in the last 168 hours Anemia Panel: No results found for this basename: VITAMINB12, FOLATE, FERRITIN, TIBC, IRON, RETICCTPCT,  in the last 168 hours Urine Drug Screen: Drugs of Abuse  No results found for this basename: labopia,  cocainscrnur,  labbenz,  amphetmu,  thcu,  labbarb    Alcohol Level: No results found for this basename: ETH,  in the last 168 hours Urinalysis:  Recent Labs Lab 08/01/13 1627 08/05/13 0205  COLORURINE AMBER* YELLOW  LABSPEC 1.014 1.012  PHURINE 6.5 7.0  GLUCOSEU NEGATIVE NEGATIVE  HGBUR LARGE* MODERATE*  BILIRUBINUR NEGATIVE NEGATIVE  KETONESUR NEGATIVE 15*  PROTEINUR NEGATIVE NEGATIVE  UROBILINOGEN 2.0* 1.0  NITRITE POSITIVE* NEGATIVE  LEUKOCYTESUR SMALL* MODERATE*    Micro Results: Recent Results (from the past 240 hour(s))  CULTURE, BLOOD (ROUTINE X 2)     Status: None   Collection Time    07/30/13 11:35 PM      Result Value Ref Range Status   Specimen Description BLOOD RIGHT ARM   Final   Special Requests BOTTLES DRAWN AEROBIC AND ANAEROBIC 5CC   Final   Culture  Setup Time     Final   Value: 07/31/2013 08:53     Performed at [facility]   Culture     Final   Value: NO GROWTH 5 DAYS     Performed at [facility]   Report Status 08/06/2013 FINAL   Final  CULTURE, BLOOD (ROUTINE X 2)     Status: None   Collection Time    07/31/13 12:25 AM      Result Value Ref Range Status   Specimen Description BLOOD LEFT ARM   Final    Special Requests BOTTLES DRAWN AEROBIC AND ANAEROBIC [facility]   Final   Culture  Setup Time     Final   Value: 07/31/2013 08:53     Performed at [facility]     Final   Value: NO GROWTH 5 DAYS  Performed at [facility]   Report Status 08/06/2013 FINAL   Final  MRSA PCR SCREENING     Status: None   Collection Time    07/31/13  3:47 AM      Result Value Ref Range Status   MRSA by PCR NEGATIVE  NEGATIVE Final   Comment:            The GeneXpert MRSA Assay (FDA     approved for NASAL specimens     only), is one component of a     comprehensive MRSA colonization     surveillance program. It is not     intended to diagnose MRSA     infection nor to guide or     monitor treatment for     MRSA infections.  URINE CULTURE     Status: None   Collection Time    07/31/13  6:29 AM      Result Value Ref Range Status   Specimen Description URINE, RANDOM   Final   Special Requests Immunocompromised   Final   Culture  Setup Time     Final   Value: 07/31/2013 13:32     Performed at [facility]     Final   Value: NO GROWTH     Performed at [facility]   Culture     Final   Value: NO GROWTH     Performed at [facility]   Report Status 08/01/2013 FINAL   Final  CULTURE, ROUTINE-ABSCESS     Status: None   Collection Time    07/31/13  1:36 PM      Result Value Ref Range Status   Specimen Description ABSCESS RIGHT HIP   Final   Special Requests POF ZOSYN AND VANCOMYCIN   Final   Gram Stain     Final   Value: ABUNDANT WBC PRESENT,BOTH PMN AND MONONUCLEAR     NO SQUAMOUS EPITHELIAL CELLS SEEN     MODERATE GRAM POSITIVE COCCI     IN PAIRS IN CHAINS     Performed at [facility]   Culture     Final   Value: MODERATE GROUP B STREP(S.AGALACTIAE)ISOLATED     Note: TESTING AGAINST S. AGALACTIAE NOT ROUTINELY PERFORMED DUE TO PREDICTABILITY OF AMP/PEN/VAN SUSCEPTIBILITY.     Performed at [facility]   Report  Status 08/03/2013 FINAL   Final  ANAEROBIC CULTURE     Status: None   Collection Time    07/31/13  1:36 PM      Result Value Ref Range Status   Specimen Description ABSCESS RIGHT HIP   Final   Special Requests POF ZOSYN AND VANCOMYCIN   Final   Gram Stain     Final   Value: ABUNDANT WBC PRESENT,BOTH PMN AND MONONUCLEAR     NO SQUAMOUS EPITHELIAL CELLS SEEN     MODERATE GRAM POSITIVE COCCI     IN PAIRS IN CHAINS     Performed at [facility]   Culture     Final   Value: NO ANAEROBES ISOLATED     Performed at [facility]   Report Status 08/05/2013 FINAL   Final  BODY FLUID CULTURE     Status: None   Collection Time    08/04/13  4:51 PM      Result Value Ref Range Status   Specimen Description SYNOVIAL FLUID RIGHT HIP   Final   Special Requests PT  ON VANCO ZOSYN   Final   Gram Stain     Final   Value: MODERATE WBC PRESENT,BOTH PMN AND MONONUCLEAR     RARE GRAM POSITIVE COCCI     IN CHAINS     Performed at [facility]   Culture     Final   Value: MODERATE GROUP B STREP(S.AGALACTIAE)ISOLATED     Performed at [facility]   Report Status 08/07/2013 FINAL   Final   Organism ID, Bacteria GROUP B STREP(S.AGALACTIAE)ISOLATED   Final  BODY FLUID CULTURE     Status: None   Collection Time    08/04/13  4:54 PM      Result Value Ref Range Status   Specimen Description SYNOVIAL FLUID HIP RIGHT   Final   Special Requests FLUID ON SWAB PT ON VANCO ZOSYN   Final   Gram Stain     Final   Value: RARE WBC PRESENT,BOTH PMN AND MONONUCLEAR     NO ORGANISMS SEEN     Performed at [facility]   Culture     Final   Value: RARE GROUP B STREP(S.AGALACTIAE)ISOLATED     Note: SUSCEPTIBILITIES PERFORMED ON PREVIOUS CULTURE WITHIN THE LAST 5 DAYS.     Performed at [facility]   Report Status 08/07/2013 FINAL   Final  ANAEROBIC CULTURE     Status: None   Collection Time    08/04/13  4:54 PM      Result Value Ref Range Status   Specimen Description  SYNOVIAL FLUID HIP RIGHT   Final   Special Requests FLUID ON SWAB PT ON VANCO ZOSYN   Final   Gram Stain     Final   Value: RARE WBC PRESENT,BOTH PMN AND MONONUCLEAR     NO ORGANISMS SEEN     Performed at [facility]   Culture     Final   Value: NO ANAEROBES ISOLATED; CULTURE IN PROGRESS FOR 5 DAYS     Performed at [facility]   Report Status PENDING   Incomplete  CLOSTRIDIUM DIFFICILE BY PCR     Status: None   Collection Time    08/05/13  8:02 PM      Result Value Ref Range Status   C difficile by pcr NEGATIVE  NEGATIVE Final   Studies/Results: No results found. Medications: I have reviewed the patient's current medications. Scheduled Meds: . sodium chloride   Intravenous Once  . atazanavir  300 mg Oral Q breakfast  . cefTRIAXone (ROCEPHIN)  IV  2 g Intravenous Q24H  . collagenase   Topical Daily  . docusate sodium  100 mg Oral BID  . enoxaparin (LOVENOX) injection  40 mg Subcutaneous Q24H  . feeding supplement (RESOURCE BREEZE)  1 Container Oral TID BM  . fludrocortisone  0.1 mg Oral Daily  . hydrocortisone sodium succinate  50 mg Intravenous Q6H  . lamiVUDine-zidovudine  1 tablet Oral BID  . midodrine  10 mg Oral TID WC  . nystatin  5 mL Oral QID  . ritonavir  100 mg Oral Q breakfast  . sodium chloride  10-40 mL Intracatheter Q12H  . sulfamethoxazole-trimethoprim  1 tablet Oral Daily  . tenofovir  300 mg Oral Daily   Continuous Infusions:  PRN Meds:.acetaminophen, acetaminophen, atropine, feeding supplement (ENSURE COMPLETE), HYDROcodone-acetaminophen, HYDROmorphone (DILAUDID) injection, loperamide, menthol-cetylpyridinium, ondansetron (ZOFRAN) IV, ondansetron, phenol, polyethylene glycol, sodium chloride, sodium chloride Assessment/Plan: Principal Problem:   Septic arthritis of hip Active Problems:   HIV  DISEASE   ANEMIA, CHRONIC   Decubitus ulcer of sacral region, stage 4   Protein-calorie malnutrition, severe   Septic shock(785.52)  # Septic  Arthritis of Right Hip in setting of stage 4 decubitus ulcer: See HPI above. Patient appears to be tolerating surgery well on POD4. He is afebrile with no leukocytosis. Ortho will determine if revision hip surgery is needed as an outpatient. His abscess culture is positive for pan sensitive group B strep and per ID he requires 6 weeks of ceftriaxone. Possible source of infection is his stage 4 decubitus ulcer. Wound care recommends general surgery assessment for possible bedside debridement. He is tolerating PT/OT appropriately and they recommend LTACH on discharge.   -appreciate orthopedics, general surgery, ID, and wound team  -continue ceftriaxone 2 g iv daily -general surgery consult for possible debridement of decubitus ulcer -acetaminophen 650 mg q6hprn, hydrocodone-acetaminophen 5-325 1-2 tab po q4hprn, hydromorphone 0.5-1 mg iv q3hprn for pain -regular diet  -nutrition consult  Macrocytic Anemia: Hgb 6.2 this morning. His baseline is 12 and this is slowly tracing down to 6.2 today. Admits to weakness and dizziness during PT/OT. He has macrocytic anemia with normal vitamin b12 and folate mostly due to anti-retroviral therapy and anemia of chronic disease. Also may be surgical losses. -transfuse 1 U PRBC and recheck CBC -cont to monitor  HIV: Pt is followed by [name], ID, who was informed of hospitalization. Patient CD4 on admission is 140 with viral load pending . Viral load <200 requires PCP prophylaxis even in setting of vanc/zosyn. On atazanavir, combivir, norvir, and tenofovir at home. He admits he has missed a few doses recently.  -Continue Atazanavir 300 mg daily  -Continue Lamivudine-Zidovudine 150-300 mg daily  -Continue ritonavir 100 mg daily  -Continue Tenofovir 300 mg daily  -bactrim 1 double strength tab daily  -ID consulted for above   History of Squamous Cell Cancer of the Anus in 2008, s/p radiation and chemotherapy: Follows with Dr. Dalbert Batman of [facility].  CT in February 2015 showed no recurrence of cancer, infection, adenopathy or hernia. CT showed post-radiation changes. Dr. Dalbert Batman informed of hospitalization.   History of DVT: LLE DVT in August of 2008. Complication of radiation and chemotherapy. No lower extremity edema, no calf tenderness. No longer on coumadin at home. PT 19.2 INR 1.62  -enoxaparin 40 mg [place] daily  Dispo: Disposition is deferred at this time, awaiting improvement of current medical problems.  Anticipated discharge in approximately 4 day(s).   The patient does have a current PCP [name], MD) and does need an [facility] hospital follow-up appointment after discharge.  The patient does not know have transportation limitations that hinder transportation to clinic appointments.  .Services Needed at time of discharge: Y = Yes, Blank = No PT:   OT:   RN:   Equipment:   Other:     LOS: 9 days   Kelby Aline, MD 08/08/2013, 11:51 AM

## 2013-08-08 NOTE — Progress Notes (Signed)
CRITICAL VALUE ALERT  Critical value received:HGB 6.2  Date of notification:  08/08/13   Time of notification: 08/08/13   Critical value read back:: Yes  Nurse who received alert: Ladell Heads, RN MD notified (1st page): Dr. Marvel Plan  Time of first page:0510 MD notified (2nd page):  Time of second page:  Responding MD:Dr Marvel Plan

## 2013-08-08 NOTE — Consult Note (Signed)
Patient has had radiation to that area and needs to be debrided in the OR this weekend.  Not sure how well this is going to heal.  Kathryne Eriksson. Dahlia Bailiff, MD, [place] [phone] [facility] Surgery

## 2013-08-08 NOTE — Progress Notes (Signed)
Patient ID: [name] [name], male   DOB: 11/28/1960, 53 y.o.   MRN: [id number]         [facility]    Date of Admission:  07/30/2013           Day 9 antibiotics  Principal Problem:   Septic arthritis of hip Active Problems:   HIV DISEASE   ANEMIA, CHRONIC   Decubitus ulcer of sacral region, stage 4   Protein-calorie malnutrition, severe   Septic shock(785.52)   . sodium chloride   Intravenous Once  . atazanavir  300 mg Oral Q breakfast  . cefTRIAXone (ROCEPHIN)  IV  2 g Intravenous Q24H  . collagenase   Topical Daily  . docusate sodium  100 mg Oral BID  . enoxaparin (LOVENOX) injection  40 mg Subcutaneous Q24H  . feeding supplement (ENSURE COMPLETE)  237 mL Oral TID BM  . fludrocortisone  0.1 mg Oral Daily  . hydrocortisone sodium succinate  50 mg Intravenous Q6H  . lamiVUDine-zidovudine  1 tablet Oral BID  . midodrine  10 mg Oral TID WC  . nystatin  5 mL Oral QID  . ritonavir  100 mg Oral Q breakfast  . sodium chloride  10-40 mL Intracatheter Q12H  . sulfamethoxazole-trimethoprim  1 tablet Oral Daily  . tenofovir  300 mg Oral Daily    Subjective: He is feeling better. He remains stressed out and worried about finances and how we will manage after discharge.  Objective: Temp:  [97.4 F (36.3 C)-98.2 F (36.8 C)] 98.2 F (36.8 C) (07/31 1330) Pulse Rate:  [37-65] 65 (07/31 1400) Resp:  [8-33] 16 (07/31 1400) BP: (93-135)/(54-67) 106/61 mmHg (07/31 1400) SpO2:  [94 %-100 %] 98 % (07/31 1400)  General: Alert, somewhat tearful during exam Skin: No acute rash.New left arm PICC Lungs: Clear Cor: Regular S1 and S2 with no murmurs  Lab Results Lab Results  Component Value Date   WBC 4.8 08/08/2013   HGB 6.2* 08/08/2013   HCT 18.6* 08/08/2013   MCV 97.4 08/08/2013   PLT 130* 08/08/2013    Lab Results  Component Value Date   CREATININE 0.88 08/08/2013   BUN 17 08/08/2013   NA 139 08/08/2013   K 3.9 08/08/2013   CL 106 08/08/2013   CO2 22  08/08/2013    HIV 1 RNA Quant (copies/mL)  Date Value  08/02/2013 251*  07/31/2013 237*  03/28/2013 252*     CD4 T Cell Abs (/uL)  Date Value  07/31/2013 140*  03/28/2013 230*  01/13/2013 200*   Assessment: He is improving slowly on therapy for group B streptococcal infection of his right hip.  Plan: 1. Continue ceftriaxone at least 6 weeks total 2. Continue current antiretroviral therapy 3. Please call me for any infectious disease questions this weekend  Michel Bickers, MD [facility] for [place] [phone] pager   [phone] cell 08/08/2013, 3:21 PM

## 2013-08-08 NOTE — Progress Notes (Addendum)
NUTRITION CONSULT/FOLLOW UP  DOCUMENTATION CODES  Per approved criteria   -Severe malnutrition in the context of chronic illness  -Underweight    Intervention:    D/C Resource Breeze  Chocolate Ensure Complete po TID, each supplement provides 350 kcal and 13 grams of protein RD to follow for nutrition care plan  New Nutrition Dx:   Malnutrition related to inadequate oral intake as evidenced by severe depletion of muscle and subcutaneous fat mass, ongoing  Goal:   Intake to meet >90% of estimated nutrition needs, progressing  Monitor:   PO & supplemental intake, weight, labs, I/O's  Assessment:   Patient is a 53 yo male with PMHx of HIV, 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 Admitted 7/22 with septic arthritis of right hip and abscess extending into the pelvis, underwent I&D and drainage on 7/23 followed by septic shock, hypotensive in spite of full resuscitation.   Patient transferred to 2C-Stepdown from 68M-MICU 7/30.  RD consulted for nutrition statust/requirements.  Initial nutrition assessment completed 7/28.  Patient with increased nutrition needs to promote wound healing of stage 4 sacral wound.  Reports his appetite is OK; doesn't care too much for the hospital food.  PO intake variable at 25-100% per flowsheet records.  He prefers switching back to chocolate Ensure Complete as Resource Breeze "goes right through me".  RD to adjust orders.  Height: Ht Readings from Last 1 Encounters:  08/07/13 6' 1.5" (1.867 m)    Weight Status:   Wt Readings from Last 1 Encounters:  08/07/13 128 lb 15.5 oz (58.5 kg)  07/31/13  123 lb 0.3 oz (55.8 kg)   Re-estimated needs:  Kcal: 2050-2200 kcal  Protein: 95-110 g  Fluid: >2 L  Skin: unstageable pressure ulcer to sacrum; right hip incision  Diet Order: General   Intake/Output Summary (Last 24 hours) at 08/08/13 1159 Last data filed at 08/08/13 1000  Gross per 24  hour  Intake    740 ml  Output    251 ml  Net    489 ml    Labs:   Recent Labs Lab 08/06/13 0415 08/07/13 0430 08/08/13 0440  NA 137 137 139  K 3.7 3.4* 3.9  CL 104 106 106  CO2 20 20 22   BUN 10 14 17   CREATININE 0.61 0.89 0.88  CALCIUM 7.2* 6.8* 7.0*  MG 1.7 2.1 1.8  PHOS 2.1* 3.0 2.7  GLUCOSE 143* 123* 120*    Scheduled Meds: . sodium chloride   Intravenous Once  . atazanavir  300 mg Oral Q breakfast  . cefTRIAXone (ROCEPHIN)  IV  2 g Intravenous Q24H  . collagenase   Topical Daily  . docusate sodium  100 mg Oral BID  . enoxaparin (LOVENOX) injection  40 mg Subcutaneous Q24H  . feeding supplement (RESOURCE BREEZE)  1 Container Oral TID BM  . fludrocortisone  0.1 mg Oral Daily  . hydrocortisone sodium succinate  50 mg Intravenous Q6H  . lamiVUDine-zidovudine  1 tablet Oral BID  . midodrine  10 mg Oral TID WC  . nystatin  5 mL Oral QID  . ritonavir  100 mg Oral Q breakfast  . sodium chloride  10-40 mL Intracatheter Q12H  . sulfamethoxazole-trimethoprim  1 tablet Oral Daily  . tenofovir  300 mg Oral Daily    Continuous Infusions:    Arthur Holms, RD, LDN Pager #: [phone] After-Hours Pager #: [phone]

## 2013-08-08 NOTE — Progress Notes (Signed)
I met the patient and evaluated him this morning. I have discussed his management plan with my team - Drs Ethelene Hal. Rivet, and Kazibwe. I have reviewed Dr Bebe Shaggy progress note from today, and I agree with his findings, assessment and management plan.

## 2013-08-08 NOTE — Progress Notes (Signed)
Patient ID: [name] [name], male   DOB: 1960-01-27, 53 y.o.   MRN: [id number] Subjective: 4 Days Post-Op Procedure(s) (LRB): RESECTION HIP JOINT - PLACEMENT OF CEMENT PROSTHESIS (Right)    Patient reports pain as mild. No events Moved to step down Orthopaedically stable at this point Objective:   VITALS:   Filed Vitals:   08/08/13 0600  BP: 133/67  Pulse: 40  Temp:   Resp: 20    Neurovascular intact Incision: dressing C/D/I  LABS  Recent Labs  08/06/13 0415 08/07/13 0430 08/08/13 0440  HGB 7.9* 7.2* 6.2*  HCT 23.3* 21.0* 18.6*  WBC 7.2 5.8 4.8  PLT 143* 149* 130*     Recent Labs  08/06/13 0415 08/07/13 0430 08/08/13 0440  NA 137 137 139  K 3.7 3.4* 3.9  BUN 10 14 17   CREATININE 0.61 0.89 0.88  GLUCOSE 143* 123* 120*    No results found for this basename: LABPT, INR,  in the last 72 hours   Assessment/Plan: 4 Days Post-Op Procedure(s) (LRB): RESECTION HIP JOINT - PLACEMENT OF CEMENT PROSTHESIS (Right)   Up with therapy Continue ABX therapy due to septicemia and septic right hip treatment  ABX for 6 weeks post resection of hip Determination of revision hip surgery to be done as an outpatient, his health and other factors specifically and most importantly related to the ability to iradicate the infection

## 2013-08-09 ENCOUNTER — Encounter (HOSPITAL_COMMUNITY)
Admission: EM | Disposition: A | Discharge status: Nursing Home (Includes ICF) | Payer: Self-pay | Source: Home / Self Care | Attending: Internal Medicine

## 2013-08-09 ENCOUNTER — Encounter (HOSPITAL_COMMUNITY): Payer: No Typology Code available for payment source | Admitting: Anesthesiology

## 2013-08-09 ENCOUNTER — Inpatient Hospital Stay (HOSPITAL_COMMUNITY): Payer: No Typology Code available for payment source | Admitting: Anesthesiology

## 2013-08-09 HISTORY — PX: PILONIDAL CYST DRAINAGE: SHX743

## 2013-08-09 LAB — BASIC METABOLIC PANEL
Anion gap: 11 (ref 5–15)
BUN: 15 mg/dL (ref 6–23)
CO2: 22 meq/L (ref 19–32)
CREATININE: 0.73 mg/dL (ref 0.50–1.35)
Calcium: 7.1 mg/dL — ABNORMAL LOW (ref 8.4–10.5)
Chloride: 108 mEq/L (ref 96–112)
GFR calc Af Amer: >90 mL/min (ref >90)
GFR calc non Af Amer: >90 mL/min (ref >90)
Glucose, Bld: 120 mg/dL — ABNORMAL HIGH (ref 70–99)
Potassium: 3.4 mEq/L — ABNORMAL LOW (ref 3.7–5.3)
Sodium: 141 mEq/L (ref 137–147)

## 2013-08-09 LAB — TYPE AND SCREEN
ABO/RH(D): B POS
Antibody Screen: NEGATIVE
Unit division: 0

## 2013-08-09 LAB — CBC
HEMATOCRIT: 20.7 % — AB (ref 39.0–52.0)
Hemoglobin: 7 g/dL — ABNORMAL LOW (ref 13.0–17.0)
MCH: 33 pg (ref 26.0–34.0)
MCHC: 33.8 g/dL (ref 30.0–36.0)
MCV: 97.6 fL (ref 78.0–100.0)
Platelets: 142 10*3/uL — ABNORMAL LOW (ref 150–400)
RBC: 2.12 MIL/uL — AB (ref 4.22–5.81)
RDW: 17.1 % — AB (ref 11.5–15.5)
WBC: 3.8 10*3/uL — AB (ref 4.0–10.5)

## 2013-08-09 LAB — ANAEROBIC CULTURE

## 2013-08-09 SURGERY — EXCISION, PILONIDAL CYST
Anesthesia: General | Site: Coccyx

## 2013-08-09 MED ORDER — ONDANSETRON HCL 4 MG/2ML IJ SOLN
INTRAMUSCULAR | Status: AC
Start: 2013-08-09 — End: 2013-08-09
  Filled 2013-08-09: qty 2

## 2013-08-09 MED ORDER — POTASSIUM CHLORIDE CRYS ER 20 MEQ PO TBCR
40.0000 meq | EXTENDED_RELEASE_TABLET | Freq: Once | ORAL | Status: AC
Start: 2013-08-09 — End: 2013-08-09
  Administered 2013-08-09: 40 meq via ORAL
  Filled 2013-08-09: qty 2

## 2013-08-09 MED ORDER — HYDROMORPHONE HCL PF 1 MG/ML IJ SOLN: 0.2500 mg | INTRAMUSCULAR | Status: DC | PRN

## 2013-08-09 MED ORDER — FENTANYL CITRATE 0.05 MG/ML IJ SOLN
INTRAMUSCULAR | Status: DC | PRN
Start: 2013-08-09 — End: 2013-08-09
  Administered 2013-08-09 (×2): 50 ug via INTRAVENOUS
  Administered 2013-08-09: 150 ug via INTRAVENOUS

## 2013-08-09 MED ORDER — BUPIVACAINE HCL (PF) 0.5 % IJ SOLN
INTRAMUSCULAR | Status: DC | PRN
  Administered 2013-08-09: 11 mL

## 2013-08-09 MED ORDER — SODIUM CHLORIDE 0.9 % IR SOLN
Status: DC | PRN
  Administered 2013-08-09: 3000 mL

## 2013-08-09 MED ORDER — PROPOFOL 10 MG/ML IV BOLUS
INTRAVENOUS | Status: AC
  Filled 2013-08-09: qty 20

## 2013-08-09 MED ORDER — OXYCODONE HCL 5 MG PO TABS: 5.0000 mg | ORAL_TABLET | Freq: Once | ORAL | Status: DC | PRN

## 2013-08-09 MED ORDER — OXYCODONE HCL 5 MG/5ML PO SOLN: 5.0000 mg | Freq: Once | ORAL | Status: DC | PRN

## 2013-08-09 MED ORDER — 0.9 % SODIUM CHLORIDE (POUR BTL) OPTIME
TOPICAL | Status: DC | PRN
  Administered 2013-08-09: 1000 mL

## 2013-08-09 MED ORDER — PROPOFOL 10 MG/ML IV BOLUS
INTRAVENOUS | Status: DC | PRN
  Administered 2013-08-09: 200 mg via INTRAVENOUS

## 2013-08-09 MED ORDER — NEOSTIGMINE METHYLSULFATE 10 MG/10ML IV SOLN
INTRAVENOUS | Status: DC | PRN
  Administered 2013-08-09: 2 mg via INTRAVENOUS

## 2013-08-09 MED ORDER — ROCURONIUM BROMIDE 50 MG/5ML IV SOLN
INTRAVENOUS | Status: AC
  Filled 2013-08-09: qty 1

## 2013-08-09 MED ORDER — LIDOCAINE HCL (CARDIAC) 20 MG/ML IV SOLN
INTRAVENOUS | Status: AC
  Filled 2013-08-09: qty 5

## 2013-08-09 MED ORDER — ONDANSETRON HCL 4 MG/2ML IJ SOLN: 4.0000 mg | Freq: Once | INTRAMUSCULAR | Status: DC | PRN

## 2013-08-09 MED ORDER — ROCURONIUM BROMIDE 100 MG/10ML IV SOLN
INTRAVENOUS | Status: DC | PRN
  Administered 2013-08-09: 30 mg via INTRAVENOUS

## 2013-08-09 MED ORDER — ONDANSETRON HCL 4 MG/2ML IJ SOLN
INTRAMUSCULAR | Status: DC | PRN
  Administered 2013-08-09: 4 mg via INTRAVENOUS

## 2013-08-09 MED ORDER — SODIUM CHLORIDE 0.9 % IV SOLN
INTRAVENOUS | Status: DC | PRN
  Administered 2013-08-09: 11:00:00 via INTRAVENOUS

## 2013-08-09 MED ORDER — LIDOCAINE HCL (CARDIAC) 20 MG/ML IV SOLN
INTRAVENOUS | Status: DC | PRN
  Administered 2013-08-09: 60 mg via INTRAVENOUS

## 2013-08-09 MED ORDER — FENTANYL CITRATE 0.05 MG/ML IJ SOLN
INTRAMUSCULAR | Status: AC
  Filled 2013-08-09: qty 5

## 2013-08-09 MED ORDER — GLYCOPYRROLATE 0.2 MG/ML IJ SOLN
INTRAMUSCULAR | Status: DC | PRN
  Administered 2013-08-09: 0.4 mg via INTRAVENOUS

## 2013-08-09 SURGICAL SUPPLY — 34 items
BLADE SURG ROTATE 9660 (MISCELLANEOUS) IMPLANT
CANISTER SUCTION 2500CC (MISCELLANEOUS) IMPLANT
COVER SURGICAL LIGHT HANDLE (MISCELLANEOUS) ×3 IMPLANT
DRAPE LAPAROTOMY T 102X78X121 (DRAPES) ×3 IMPLANT
DRAPE UTILITY 15X26 W/TAPE STR (DRAPE) ×6 IMPLANT
DRSG PAD ABDOMINAL 8X10 ST (GAUZE/BANDAGES/DRESSINGS) ×2 IMPLANT
ELECT CAUTERY BLADE 6.4 (BLADE) ×3 IMPLANT
ELECT REM PT RETURN 9FT ADLT (ELECTROSURGICAL) ×3
ELECTRODE REM PT RTRN 9FT ADLT (ELECTROSURGICAL) ×1 IMPLANT
GAUZE SPONGE 4X4 12PLY STRL (GAUZE/BANDAGES/DRESSINGS) ×3 IMPLANT
GLOVE SURG SIGNA 7.5 PF LTX (GLOVE) ×6 IMPLANT
GOWN STRL REUS W/ TWL LRG LVL3 (GOWN DISPOSABLE) ×1 IMPLANT
GOWN STRL REUS W/ TWL XL LVL3 (GOWN DISPOSABLE) ×1 IMPLANT
GOWN STRL REUS W/TWL LRG LVL3 (GOWN DISPOSABLE) ×3
GOWN STRL REUS W/TWL XL LVL3 (GOWN DISPOSABLE) ×3
KIT BASIN OR (CUSTOM PROCEDURE TRAY) ×3 IMPLANT
KIT ROOM TURNOVER OR (KITS) ×3 IMPLANT
NDL HYPO 25GX1X1/2 BEV (NEEDLE) IMPLANT
NEEDLE HYPO 25GX1X1/2 BEV (NEEDLE) IMPLANT
NS IRRIG 1000ML POUR BTL (IV SOLUTION) ×3 IMPLANT
PACK SURGICAL SETUP 50X90 (CUSTOM PROCEDURE TRAY) ×3 IMPLANT
PAD ARMBOARD 7.5X6 YLW CONV (MISCELLANEOUS) ×8 IMPLANT
PENCIL BUTTON HOLSTER BLD 10FT (ELECTRODE) ×3 IMPLANT
SPECIMEN JAR SMALL (MISCELLANEOUS) IMPLANT
SPONGE GAUZE 4X4 12PLY STER LF (GAUZE/BANDAGES/DRESSINGS) ×2 IMPLANT
SPONGE LAP 18X18 X RAY DECT (DISPOSABLE) ×5 IMPLANT
TAPE CLOTH SURG 6X10 WHT LF (GAUZE/BANDAGES/DRESSINGS) ×2 IMPLANT
TOWEL OR 17X24 6PK STRL BLUE (TOWEL DISPOSABLE) ×3 IMPLANT
TOWEL OR 17X26 10 PK STRL BLUE (TOWEL DISPOSABLE) ×3 IMPLANT
TUBE CONNECTING 12'X1/4 (SUCTIONS) ×1
TUBE CONNECTING 12X1/4 (SUCTIONS) ×2 IMPLANT
UNDERPAD 30X30 INCONTINENT (UNDERPADS AND DIAPERS) ×3 IMPLANT
WATER STERILE IRR 1000ML POUR (IV SOLUTION) IMPLANT
YANKAUER SUCT BULB TIP NO VENT (SUCTIONS) ×3 IMPLANT

## 2013-08-09 NOTE — Progress Notes (Addendum)
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  [name] had no complaints this morning and is in good spirits.  He has been afebrile and his R hip pain is stable and he has been working with PT and OT.  He denies chest pain, SOB, nausea, vomiting, or chills.  Objective: Vital signs in last 24 hours: Filed Vitals:   08/09/13 0001 08/09/13 0200 08/09/13 0419 08/09/13 0800  BP: 113/72 111/70 122/70 107/72  Pulse: 57 49 51 44  Temp: 98 F (36.7 C)  97.5 F (36.4 C) 97.8 F (36.6 C)  TempSrc: Oral  Oral Oral  Resp: 21 25 15 17   Height:      Weight:      SpO2: 98% 95% 99% 98%   Weight change:   Intake/Output Summary (Last 24 hours) at 08/09/13 0834 Last data filed at 08/09/13 0735  Gross per 24 hour  Intake  852.5 ml  Output   1780 ml  Net -927.5 ml   BP 107/72  Pulse 44  Temp(Src) 97.8 F (36.6 C) (Oral)  Resp 17  Ht 6' 1.5" (1.867 m)  Wt 128 lb 15.5 oz (58.5 kg)  BMI 16.78 kg/m2  SpO2 98%  General Appearance:    Alert, cooperative, no distress, cachetic.  HEENT:    Normocephalic, without obvious abnormality, atraumatic, PERRL, EOMI, MMM  Back:     Symmetric, no curvature, R lower back decubitus ulcer well bandaged.  Lungs:     Clear to auscultation bilaterally, no respiratory distress.  Chest wall:    No tenderness or deformity.  Heart:    Regular rate and rhythm, S1 and S2 normal, no murmur, rub   or gallop  Abdomen:     Soft, non-tender, bowel sounds active,    no masses, no organomegaly.  Extremities:   R lateral hip and upper leg with several incisions well bandaged. Bandages with dried serosanguinous fluid. No cyanosis or edema in feet. SCDs in place.  Pulses:   2+ and symmetric radial  and DP pulses.   Lab Results: Basic Metabolic Panel:  Recent Labs Lab 08/07/13 0430 08/08/13 0440 08/09/13 0530  NA 137 139 141  K 3.4* 3.9 3.4*  CL 106 106 108  CO2 20 22 22   GLUCOSE 123* 120* 120*  BUN 14 17 15   CREATININE 0.89 0.88 0.73  CALCIUM 6.8* 7.0* 7.1*  MG 2.1 1.8  --   PHOS 3.0 2.7  --    Liver Function Tests: No results found for this basename: AST, ALT, ALKPHOS, BILITOT, PROT, ALBUMIN,  in the last 168 hours No results found for this basename: LIPASE, AMYLASE,  in the last 168 hours No results found for this basename: AMMONIA,  in the last 168 hours CBC:  Recent Labs Lab 08/08/13 1630 08/09/13 0530  WBC 5.4 3.8*  HGB 7.0* 7.0*  HCT 20.5* 20.7*  MCV 96.2 97.6  PLT 124* 142*   Cardiac Enzymes:  Recent Labs Lab 08/02/13 1206 08/02/13 1810 08/03/13 0001  TROPONINI <0.30 <0.30 <0.30   BNP: No results found for this basename: PROBNP,  in the last 168 hours D-Dimer: No results found for this basename: DDIMER,  in the last 168 hours CBG:  Recent Labs Lab  08/02/13 1951  GLUCAP 110*   Hemoglobin A1C: No results found for this basename: HGBA1C,  in the last 168 hours Fasting Lipid Panel: No results found for this basename: CHOL, HDL, LDLCALC, TRIG, CHOLHDL, LDLDIRECT,  in the last 168 hours Thyroid Function Tests: No results found for this basename: TSH, T4TOTAL, FREET4, T3FREE, THYROIDAB,  in the last 168 hours Coagulation: No results found for this basename: LABPROT, INR,  in the last 168 hours Anemia Panel: No results found for this basename: VITAMINB12, FOLATE, FERRITIN, TIBC, IRON, RETICCTPCT,  in the last 168 hours Urine Drug Screen: Drugs of Abuse  No results found for this basename: labopia,  cocainscrnur,  labbenz,  amphetmu,  thcu,  labbarb    Alcohol Level: No results found for this basename: ETH,  in the last 168 hours Urinalysis:  Recent Labs Lab 08/05/13 0205  COLORURINE YELLOW  LABSPEC 1.012  PHURINE 7.0  GLUCOSEU  NEGATIVE  HGBUR MODERATE*  BILIRUBINUR NEGATIVE  KETONESUR 15*  PROTEINUR NEGATIVE  UROBILINOGEN 1.0  NITRITE NEGATIVE  LEUKOCYTESUR MODERATE*    Micro Results: Recent Results (from the past 240 hour(s))  CULTURE, BLOOD (ROUTINE X 2)     Status: None   Collection Time    07/30/13 11:35 PM      Result Value Ref Range Status   Specimen Description BLOOD RIGHT ARM   Final   Special Requests BOTTLES DRAWN AEROBIC AND ANAEROBIC 5CC   Final   Culture  Setup Time     Final   Value: 07/31/2013 08:53     Performed at [facility]   Culture     Final   Value: NO GROWTH 5 DAYS     Performed at [facility]   Report Status 08/06/2013 FINAL   Final  CULTURE, BLOOD (ROUTINE X 2)     Status: None   Collection Time    07/31/13 12:25 AM      Result Value Ref Range Status   Specimen Description BLOOD LEFT ARM   Final   Special Requests BOTTLES DRAWN AEROBIC AND ANAEROBIC [facility]   Final   Culture  Setup Time     Final   Value: 07/31/2013 08:53     Performed at [facility]   Culture     Final   Value: NO GROWTH 5 DAYS     Performed at [facility]   Report Status 08/06/2013 FINAL   Final  MRSA PCR SCREENING     Status: None   Collection Time    07/31/13  3:47 AM      Result Value Ref Range Status   MRSA by PCR NEGATIVE  NEGATIVE Final   Comment:            The GeneXpert MRSA Assay (FDA     approved for NASAL specimens     only), is one component of a     comprehensive MRSA colonization     surveillance program. It is not     intended to diagnose MRSA     infection nor to guide or     monitor treatment for     MRSA infections.  URINE CULTURE     Status: None   Collection Time    07/31/13  6:29 AM      Result Value Ref Range Status   Specimen Description URINE, RANDOM   Final   Special Requests Immunocompromised   Final   Culture  Setup Time     Final  Value: 07/31/2013 13:32     Performed at [facility]     Final    Value: NO GROWTH     Performed at [facility]   Culture     Final   Value: NO GROWTH     Performed at [facility]   Report Status 08/01/2013 FINAL   Final  CULTURE, ROUTINE-ABSCESS     Status: None   Collection Time    07/31/13  1:36 PM      Result Value Ref Range Status   Specimen Description ABSCESS RIGHT HIP   Final   Special Requests POF ZOSYN AND VANCOMYCIN   Final   Gram Stain     Final   Value: ABUNDANT WBC PRESENT,BOTH PMN AND MONONUCLEAR     NO SQUAMOUS EPITHELIAL CELLS SEEN     MODERATE GRAM POSITIVE COCCI     IN PAIRS IN CHAINS     Performed at [facility]   Culture     Final   Value: MODERATE GROUP B STREP(S.AGALACTIAE)ISOLATED     Note: TESTING AGAINST S. AGALACTIAE NOT ROUTINELY PERFORMED DUE TO PREDICTABILITY OF AMP/PEN/VAN SUSCEPTIBILITY.     Performed at [facility]   Report Status 08/03/2013 FINAL   Final  ANAEROBIC CULTURE     Status: None   Collection Time    07/31/13  1:36 PM      Result Value Ref Range Status   Specimen Description ABSCESS RIGHT HIP   Final   Special Requests POF ZOSYN AND VANCOMYCIN   Final   Gram Stain     Final   Value: ABUNDANT WBC PRESENT,BOTH PMN AND MONONUCLEAR     NO SQUAMOUS EPITHELIAL CELLS SEEN     MODERATE GRAM POSITIVE COCCI     IN PAIRS IN CHAINS     Performed at [facility]   Culture     Final   Value: NO ANAEROBES ISOLATED     Performed at [facility]   Report Status 08/05/2013 FINAL   Final  BODY FLUID CULTURE     Status: None   Collection Time    08/04/13  4:51 PM      Result Value Ref Range Status   Specimen Description SYNOVIAL FLUID RIGHT HIP   Final   Special Requests PT ON VANCO ZOSYN   Final   Gram Stain     Final   Value: MODERATE WBC PRESENT,BOTH PMN AND MONONUCLEAR     RARE GRAM POSITIVE COCCI     IN CHAINS     Performed at [facility]   Culture     Final   Value: MODERATE GROUP B STREP(S.AGALACTIAE)ISOLATED     Performed at [facility]   Report Status 08/07/2013 FINAL   Final   Organism ID, Bacteria GROUP B STREP(S.AGALACTIAE)ISOLATED   Final  BODY FLUID CULTURE     Status: None   Collection Time    08/04/13  4:54 PM      Result Value Ref Range Status   Specimen Description SYNOVIAL FLUID HIP RIGHT   Final   Special Requests FLUID ON SWAB PT ON VANCO ZOSYN   Final   Gram Stain     Final   Value: RARE WBC PRESENT,BOTH PMN AND MONONUCLEAR     NO ORGANISMS SEEN     Performed at [facility]   Culture     Final   Value: RARE GROUP B STREP(S.AGALACTIAE)ISOLATED  Note: SUSCEPTIBILITIES PERFORMED ON PREVIOUS CULTURE WITHIN THE LAST 5 DAYS.     Performed at [facility]   Report Status 08/07/2013 FINAL   Final  ANAEROBIC CULTURE     Status: None   Collection Time    08/04/13  4:54 PM      Result Value Ref Range Status   Specimen Description SYNOVIAL FLUID HIP RIGHT   Final   Special Requests FLUID ON SWAB PT ON VANCO ZOSYN   Final   Gram Stain     Final   Value: RARE WBC PRESENT,BOTH PMN AND MONONUCLEAR     NO ORGANISMS SEEN     Performed at [facility]   Culture     Final   Value: NO ANAEROBES ISOLATED; CULTURE IN PROGRESS FOR 5 DAYS     Performed at [facility]   Report Status PENDING   Incomplete  CLOSTRIDIUM DIFFICILE BY PCR     Status: None   Collection Time    08/05/13  8:02 PM      Result Value Ref Range Status   C difficile by pcr NEGATIVE  NEGATIVE Final   Studies/Results: No results found. Medications: I have reviewed the patient's current medications. Scheduled Meds: . atazanavir  300 mg Oral Q breakfast  . cefTRIAXone (ROCEPHIN)  IV  2 g Intravenous Q24H  . collagenase   Topical Daily  . docusate sodium  100 mg Oral BID  . feeding supplement (ENSURE COMPLETE)  237 mL Oral TID BM  . fludrocortisone  0.1 mg Oral Daily  . hydrocortisone sodium succinate  50 mg Intravenous Q6H  . lamiVUDine-zidovudine  1 tablet Oral BID  . midodrine  10 mg Oral TID WC    . nystatin  5 mL Oral QID  . ritonavir  100 mg Oral Q breakfast  . sodium chloride  10-40 mL Intracatheter Q12H  . sulfamethoxazole-trimethoprim  1 tablet Oral Daily  . tenofovir  300 mg Oral Daily   Continuous Infusions:  PRN Meds:.acetaminophen, acetaminophen, atropine, HYDROcodone-acetaminophen, HYDROmorphone (DILAUDID) injection, loperamide, menthol-cetylpyridinium, ondansetron (ZOFRAN) IV, ondansetron, phenol, polyethylene glycol, sodium chloride, sodium chloride  Assessment/Plan: Principal Problem:   Septic arthritis of hip Active Problems:   HIV DISEASE   ANEMIA, CHRONIC   Decubitus ulcer of sacral region, stage 4   Protein-calorie malnutrition, severe   Septic shock(785.52)  # Septic Arthritis of Right Hip in setting of stage 4 decubitus ulcer: Patient appears to be recovering well post-surgery POD5.  General surgery plans to take him to the OR to debride his decubitus ulcer today.  He has been afebrile since switching to Rocephin for pan-sensitive group B strep.  Per ID he will need 6 weeks of antibiotics.  It is possible that his decubitus ulcer seeded his hip, so debriding the ulcer could be beneficial. PT/OT going well with LTACH recommended on discharge. -appreciate orthopedics, general surgery, ID, and wound team  -continue ceftriaxone 2 g iv daily -general surgery will take to OR today to debride ulcer -acetaminophen 650 mg q6hprn, hydrocodone-acetaminophen 5-325 1-2 tab po q4hprn, hydromorphone 0.5-1 mg iv q3hprn for pain -NPO for surgical debridement -Holding Lovenox today for surgical debridement today -Nutrition recommends chocolate Ensure Complete PO TID, RD following. -K 3.4, will supplement with KDur 40 mEq when patient resumes diet later today.  Macrocytic Anemia: Hgb 7.0 this morning and stable after transfusing 1 units PRBC yesterday. Baseline is 12. He has macrocytic anemia with normal vitamin b12 and folate mostly due to anti-retroviral therapy  and anemia of  chronic disease. Surgical losses possible. -Monitor for lightheadedness and weakness during PT/OT. -cont to monitor CBC.  HIV: Pt is followed by [name], ID, who was informed of hospitalization. Patient CD4 on admission is 140 with viral load 251, stable from 4 months ago. CD4 <200 requires PCP prophylaxis. On atazanavir, combivir, norvir, and tenofovir at home. He admits he has missed a few doses recently.  -Continue Atazanavir 300 mg daily  -Continue Lamivudine-Zidovudine 150-300 mg daily  -Continue ritonavir 100 mg daily  -Continue Tenofovir 300 mg daily  -Bactrim 1 double strength tab daily  -Appreciate ID recs  History of Squamous Cell Cancer of the Anus in 2008, s/p radiation and chemotherapy: Follows with Dr. Dalbert Batman of [facility]. CT in February 2015 showed no recurrence of cancer, infection, adenopathy or hernia. CT showed post-radiation changes. Dr. Dalbert Batman informed of hospitalization.   History of DVT: LLE DVT in August of 2008. Complication of radiation and chemotherapy. No lower extremity edema, no calf tenderness. No longer on coumadin at home. PT 19.2 INR 1.62  -enoxaparin 40 mg Atkinson daily held today for surgical debridement -SCDs  Dispo: Disposition is deferred at this time, awaiting improvement of current medical problems.  Anticipated discharge in approximately 3 day(s).   The patient does have a current PCP [name], MD) and does need an [facility] hospital follow-up appointment after discharge.  The patient does not know have transportation limitations that hinder transportation to clinic appointments.  .Services Needed at time of discharge: Y = Yes, Blank = No PT:   OT:   RN:   Equipment:   Other: LTACH    LOS: 10 days   Arman Filter, MD 08/09/2013, 8:34 AM

## 2013-08-09 NOTE — Anesthesia Procedure Notes (Signed)
Procedure Name: Intubation Date/Time: 08/09/2013 11:15 AM Performed by: Marinda Elk A Pre-anesthesia Checklist: Patient identified, Timeout performed, Emergency Drugs available, Suction available and Patient being monitored Patient Re-evaluated:Patient Re-evaluated prior to inductionOxygen Delivery Method: Circle system utilized Preoxygenation: Pre-oxygenation with 100% oxygen Intubation Type: IV induction Ventilation: Mask ventilation without difficulty Tube type: Oral Tube size: 7.5 mm Number of attempts: 1 Airway Equipment and Method: Video-laryngoscopy Placement Confirmation: ETT inserted through vocal cords under direct vision,  breath sounds checked- equal and bilateral and positive ETCO2 Secured at: 23 cm Tube secured with: Tape Dental Injury: Teeth and Oropharynx as per pre-operative assessment

## 2013-08-09 NOTE — Progress Notes (Signed)
Patient ID: [name] [name], male   DOB: July 29, 1960, 53 y.o.   MRN: [id number]  Patient awake, stable Plan debridement of sacral decub in OR today. I discussed the risks including bleeding, ongoing infection, need for further surgery, etc. He agrees to proceed.

## 2013-08-09 NOTE — Op Note (Signed)
IRRIGATION AND DEBRIDEMENT SACRAL DECUBITUS  Procedure Note  [name] 07/30/2013 - 08/09/2013   Pre-op Diagnosis: sacral decubitus     Post-op Diagnosis: sacral  Procedure(s): IRRIGATION AND DEBRIDEMENT STAGE 4 SACRAL DECUBITUS  Surgeon(s): Harl Bowie, MD  Anesthesia: General  Staff:  Circulator: Natalia Leatherwood, RN Scrub Person: Delman Cheadle, CST  Estimated Blood Loss: Minimal               Indications: This is a 53 year old gentleman who is HIV positive and who has had prior radiation to the perineum. He now has a stage IV sacral decubitus with necrotic tissue requiring debridement in the operating room.  Findings: This was found to have a large stage IV sacral decubitus with necrotic tissue. A 36 cm area of tissue was completely debrided with sharp debridement with a scalpel. This included skin and subcutaneous tissue all the way down to the exposed sacrum. No healthy tissue was removed. This is the first procedure for this gentleman regarding his wound care  Procedure in detail:  The patient was brought to the operating room and identified as the correct patient. Generally she is induced and the patient placed in the prone position. His sacral area was then prepped and draped in usual sterile fashion. The total size of his wound was slightly greater than 36 cm. There was necrotic eschar over the top of the bone. I sharply debrided the 36 and a meter square necrotic eschar with a scalpel which included skin and tibialis tissue down to the sacrum. The underlying bone was firm with no purulence. The surrounding muscle lateral to the bone appeared pink and well perfused. The surrounding skin also appeared healthy and viable. I achieved hemostasis the cautery and irrigated with saline. I then anesthetized circumferentially with Marcaine. Wet to dry gauze and placed on the woundthere was an. The patient was then extubated in the operating room and taken to the  recovery room. All counts were correct at the end of the procedure.Marland Kitchen           Jovan Schickling A   Date: 08/09/2013  Time: 11:42 AM

## 2013-08-09 NOTE — Transfer of Care (Signed)
Immediate Anesthesia Transfer of Care Note  Patient: [name] [name]  Procedure(s) Performed: Procedure(s): IRRIGATION AND DEBRIDEMENT SACRAL DECUBITUS (N/A)  Patient Location: PACU  Anesthesia Type:General  Level of Consciousness: awake  Airway & Oxygen Therapy: Patient Spontanous Breathing  Post-op Assessment: Report given to PACU RN and Post -op Vital signs reviewed and stable  Post vital signs: Reviewed and stable  Complications: No apparent anesthesia complications

## 2013-08-09 NOTE — Anesthesia Preprocedure Evaluation (Addendum)
Anesthesia Evaluation  Patient identified by MRN, date of birth, ID band Patient awake    Reviewed: Allergy & Precautions, H&P , NPO status , Patient's Chart, lab work & pertinent test results  Airway Mallampati: I TM Distance: >3 FB Neck ROM: Full    Dental  (+) Teeth Intact, Dental Advisory Given, Poor Dentition   Pulmonary Current Smoker,  breath sounds clear to auscultation        Cardiovascular + Peripheral Vascular Disease Rhythm:Regular Rate:Normal     Neuro/Psych    GI/Hepatic   Endo/Other    Renal/GU      Musculoskeletal   Abdominal   Peds  Hematology  (+) HIV,   Anesthesia Other Findings   Reproductive/Obstetrics                          Anesthesia Physical Anesthesia Plan  ASA: III  Anesthesia Plan: General   Post-op Pain Management:    Induction: Intravenous  Airway Management Planned: Oral ETT  Additional Equipment:   Intra-op Plan:   Post-operative Plan: Extubation in OR  Informed Consent: I have reviewed the patients History and Physical, chart, labs and discussed the procedure including the risks, benefits and alternatives for the proposed anesthesia with the patient or authorized representative who has indicated his/her understanding and acceptance.   Dental advisory given  Plan Discussed with: CRNA, Anesthesiologist and Surgeon  Anesthesia Plan Comments:         Anesthesia Quick Evaluation

## 2013-08-09 NOTE — Anesthesia Postprocedure Evaluation (Signed)
  Anesthesia Post-op Note  Patient: [name] [name]  Procedure(s) Performed: Procedure(s): IRRIGATION AND DEBRIDEMENT SACRAL DECUBITUS (N/A)  Patient Location: PACU  Anesthesia Type: General   Level of Consciousness: awake, alert  and oriented  Airway and Oxygen Therapy: Patient Spontanous Breathing  Post-op Pain: none Post-op Assessment: Post-op Vital signs reviewed  Post-op Vital Signs: Reviewed  Last Vitals:  Filed Vitals:   08/09/13 1219  BP: 159/80  Pulse: 56  Temp:   Resp: 14    Complications: No apparent anesthesia complications

## 2013-08-10 LAB — BASIC METABOLIC PANEL
ANION GAP: 11 (ref 5–15)
Anion gap: 11 (ref 5–15)
Anion gap: 12 (ref 5–15)
BUN: 9 mg/dL (ref 6–23)
BUN: 8 mg/dL (ref 6–23)
BUN: 11 mg/dL (ref 6–23)
CALCIUM: 7.2 mg/dL — AB (ref 8.4–10.5)
CALCIUM: 7.2 mg/dL — AB (ref 8.4–10.5)
CO2: 29 mEq/L (ref 19–32)
CO2: 29 mEq/L (ref 19–32)
CO2: 27 mEq/L (ref 19–32)
CREATININE: 0.57 mg/dL (ref 0.50–1.35)
Calcium: 7.3 mg/dL — ABNORMAL LOW (ref 8.4–10.5)
Chloride: 98 mEq/L (ref 96–112)
Chloride: 98 mEq/L (ref 96–112)
Chloride: 97 mEq/L (ref 96–112)
Creatinine, Ser: 0.61 mg/dL (ref 0.50–1.35)
Creatinine, Ser: 0.69 mg/dL (ref 0.50–1.35)
GFR calc Af Amer: >90 mL/min (ref >90)
GFR calc non Af Amer: >90 mL/min (ref >90)
Glucose, Bld: 116 mg/dL — ABNORMAL HIGH (ref 70–99)
Glucose, Bld: 112 mg/dL — ABNORMAL HIGH (ref 70–99)
Glucose, Bld: 148 mg/dL — ABNORMAL HIGH (ref 70–99)
Potassium: 2.6 mEq/L — CL (ref 3.7–5.3)
Potassium: 2.7 mEq/L — CL (ref 3.7–5.3)
Potassium: 3.3 mEq/L — ABNORMAL LOW (ref 3.7–5.3)
SODIUM: 138 meq/L (ref 137–147)
Sodium: 138 mEq/L (ref 137–147)
Sodium: 136 mEq/L — ABNORMAL LOW (ref 137–147)

## 2013-08-10 LAB — CBC
HCT: 22.3 % — ABNORMAL LOW (ref 39.0–52.0)
Hemoglobin: 7.5 g/dL — ABNORMAL LOW (ref 13.0–17.0)
MCH: 31.9 pg (ref 26.0–34.0)
MCHC: 33.6 g/dL (ref 30.0–36.0)
MCV: 94.9 fL (ref 78.0–100.0)
PLATELETS: 167 10*3/uL (ref 150–400)
RBC: 2.35 MIL/uL — ABNORMAL LOW (ref 4.22–5.81)
RDW: 16.8 % — ABNORMAL HIGH (ref 11.5–15.5)
WBC: 5.4 10*3/uL (ref 4.0–10.5)

## 2013-08-10 LAB — MAGNESIUM: Magnesium: 1.3 mg/dL — ABNORMAL LOW (ref 1.5–2.5)

## 2013-08-10 MED ORDER — POTASSIUM CHLORIDE CRYS ER 20 MEQ PO TBCR
40.0000 meq | EXTENDED_RELEASE_TABLET | Freq: Two times a day (BID) | ORAL | Status: DC
  Administered 2013-08-10: 40 meq via ORAL
  Filled 2013-08-10 (×2): qty 2

## 2013-08-10 MED ORDER — POTASSIUM CHLORIDE CRYS ER 20 MEQ PO TBCR
40.0000 meq | EXTENDED_RELEASE_TABLET | Freq: Once | ORAL | Status: AC
  Administered 2013-08-10: 40 meq via ORAL

## 2013-08-10 MED ORDER — POTASSIUM CHLORIDE CRYS ER 20 MEQ PO TBCR: 40.0000 meq | EXTENDED_RELEASE_TABLET | Freq: Three times a day (TID) | ORAL | Status: DC

## 2013-08-10 MED ORDER — POTASSIUM CHLORIDE CRYS ER 20 MEQ PO TBCR
40.0000 meq | EXTENDED_RELEASE_TABLET | Freq: Once | ORAL | Status: AC
  Administered 2013-08-10: 40 meq via ORAL
  Filled 2013-08-10: qty 2

## 2013-08-10 NOTE — Progress Notes (Signed)
CRITICAL VALUE ALERT  Critical value received:  2.6 Potassium  Date of notification:  08/10/2013  Time of notification:  0528  Critical value read back:Yes.    Nurse who received alert:  Glee Arvin  MD notified (1st page):  Internal Medicine Teaching Service-On Call  Time of first page:  0530  MD notified (2nd page):  Time of second page:  Responding MD:    Time MD responded:

## 2013-08-10 NOTE — Progress Notes (Signed)
Pt transferred off this unit 2C to 6N13, via bed, no O2, on RA, all belongings collected and taken along with him. Pt accompanied by 2 RN's, and was placed in his new room.

## 2013-08-10 NOTE — Progress Notes (Signed)
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  [name] [name] went to the operating room yesterday for debridement of his sacral decubitus ulcer.  Complete debridement was completed, including skin and subcutaneous tissue down to the sacrum.  He was doing well post-procedure and eating clear liquids without problem.  He has been afebrile since then and his R hip pain and back pain remains stable. He continues to work with PT and OT.  He denies chest pain, SOB, nausea, vomiting, or chills.  Objective: Vital signs in last 24 hours: Filed Vitals:   08/10/13 0400 08/10/13 0403 08/10/13 0750 08/10/13 0800  BP:  140/74 139/65   Pulse:  53 53   Temp:  97.8 F (36.6 C) 98.2 F (36.8 C)   TempSrc:  Oral Oral   Resp: 22 28 20 23   Height:      Weight:      SpO2: 96% 96% 98% 99%   Weight change:   Intake/Output Summary (Last 24 hours) at 08/10/13 0853 Last data filed at 08/10/13 0408  Gross per 24 hour  Intake    300 ml  Output   6210 ml  Net  -5910 ml   BP 139/65  Pulse 53  Temp(Src) 98.2 F (36.8 C) (Oral)  Resp 23  Ht 6' 1.5" (1.867 m)  Wt 128 lb 15.5 oz (58.5 kg)  BMI 16.78 kg/m2  SpO2 99%  General Appearance:    Alert, cooperative, no distress, cachetic.  HEENT:    Normocephalic, without obvious abnormality, atraumatic, PERRL, EOMI, MMM  Back:     Symmetric, no curvature, R lower back decubitus ulcer well bandaged.  Lungs:     Clear to auscultation bilaterally, no respiratory distress.  Chest wall:    No tenderness or deformity.  Heart:    Regular rate and rhythm, S1 and S2 normal, no murmur, rub   or gallop  Abdomen:     Soft, non-tender, bowel sounds active,    no masses, no organomegaly.    Extremities:   R lateral hip and upper leg with several incisions well bandaged. Bandages with dried serosanguinous fluid. No cyanosis or edema in feet. SCDs in place.  Pulses:   2+ and symmetric radial and DP pulses.   Lab Results: Basic Metabolic Panel:  Recent Labs Lab 08/07/13 0430 08/08/13 0440 08/09/13 0530 08/10/13 0430  NA 137 139 141 138  K 3.4* 3.9 3.4* 2.6*  CL 106 106 108 98  CO2 20 22 22 29   GLUCOSE 123* 120* 120* 116*  BUN 14 17 15 9   CREATININE 0.89 0.88 0.73 0.61  CALCIUM 6.8* 7.0* 7.1* 7.2*  MG 2.1 1.8  --   --   PHOS 3.0 2.7  --   --    Liver Function Tests: No results found for this basename: AST, ALT, ALKPHOS, BILITOT, PROT, ALBUMIN,  in the last 168 hours No results found for this basename: LIPASE, AMYLASE,  in the last 168 hours No results found for this basename: AMMONIA,  in the last 168 hours CBC:  Recent Labs Lab 08/09/13 0530 08/10/13 0430  WBC 3.8* 5.4  HGB 7.0* 7.5*  HCT 20.7* 22.3*  MCV 97.6 94.9  PLT 142* 167   Cardiac Enzymes:  No results found for this basename: CKTOTAL, CKMB, CKMBINDEX, TROPONINI,  in the last 168 hours BNP: No results found for this basename: PROBNP,  in the last 168 hours D-Dimer: No results found for this basename: DDIMER,  in the last 168 hours CBG: No results found for this basename: GLUCAP,  in the last 168 hours Hemoglobin A1C: No results found for this basename: HGBA1C,  in the last 168 hours Fasting Lipid Panel: No results found for this basename: CHOL, HDL, LDLCALC, TRIG, CHOLHDL, LDLDIRECT,  in the last 168 hours Thyroid Function Tests: No results found for this basename: TSH, T4TOTAL, FREET4, T3FREE, THYROIDAB,  in the last 168 hours Coagulation: No results found for this basename: LABPROT, INR,  in the last 168 hours Anemia Panel: No results found for this basename: VITAMINB12, FOLATE, FERRITIN, TIBC, IRON, RETICCTPCT,  in the last 168 hours Urine Drug Screen: Drugs of Abuse  No results found  for this basename: labopia,  cocainscrnur,  labbenz,  amphetmu,  thcu,  labbarb    Alcohol Level: No results found for this basename: ETH,  in the last 168 hours Urinalysis:  Recent Labs Lab 08/05/13 0205  COLORURINE YELLOW  LABSPEC 1.012  PHURINE 7.0  GLUCOSEU NEGATIVE  HGBUR MODERATE*  BILIRUBINUR NEGATIVE  KETONESUR 15*  PROTEINUR NEGATIVE  UROBILINOGEN 1.0  NITRITE NEGATIVE  LEUKOCYTESUR MODERATE*    Micro Results: Recent Results (from the past 240 hour(s))  CULTURE, ROUTINE-ABSCESS     Status: None   Collection Time    07/31/13  1:36 PM      Result Value Ref Range Status   Specimen Description ABSCESS RIGHT HIP   Final   Special Requests POF ZOSYN AND VANCOMYCIN   Final   Gram Stain     Final   Value: ABUNDANT WBC PRESENT,BOTH PMN AND MONONUCLEAR     NO SQUAMOUS EPITHELIAL CELLS SEEN     MODERATE GRAM POSITIVE COCCI     IN PAIRS IN CHAINS     Performed at [facility]   Culture     Final   Value: MODERATE GROUP B STREP(S.AGALACTIAE)ISOLATED     Note: TESTING AGAINST S. AGALACTIAE NOT ROUTINELY PERFORMED DUE TO PREDICTABILITY OF AMP/PEN/VAN SUSCEPTIBILITY.     Performed at [facility]   Report Status 08/03/2013 FINAL   Final  ANAEROBIC CULTURE     Status: None   Collection Time    07/31/13  1:36 PM      Result Value Ref Range Status   Specimen Description ABSCESS RIGHT HIP   Final   Special Requests POF ZOSYN AND VANCOMYCIN   Final   Gram Stain     Final   Value: ABUNDANT WBC PRESENT,BOTH PMN AND MONONUCLEAR     NO SQUAMOUS EPITHELIAL CELLS SEEN     MODERATE GRAM POSITIVE COCCI     IN PAIRS IN CHAINS     Performed at [facility]   Culture     Final   Value: NO ANAEROBES ISOLATED     Performed at [facility]   Report Status 08/05/2013 FINAL   Final  BODY FLUID CULTURE     Status: None   Collection Time    08/04/13  4:51 PM      Result Value Ref Range Status   Specimen Description SYNOVIAL FLUID RIGHT HIP   Final    Special Requests PT ON VANCO ZOSYN   Final   Gram Stain     Final   Value: MODERATE  WBC PRESENT,BOTH PMN AND MONONUCLEAR     RARE GRAM POSITIVE COCCI     IN CHAINS     Performed at [facility]   Culture     Final   Value: MODERATE GROUP B STREP(S.AGALACTIAE)ISOLATED     Performed at [facility]   Report Status 08/07/2013 FINAL   Final   Organism ID, Bacteria GROUP B STREP(S.AGALACTIAE)ISOLATED   Final  BODY FLUID CULTURE     Status: None   Collection Time    08/04/13  4:54 PM      Result Value Ref Range Status   Specimen Description SYNOVIAL FLUID HIP RIGHT   Final   Special Requests FLUID ON SWAB PT ON VANCO ZOSYN   Final   Gram Stain     Final   Value: RARE WBC PRESENT,BOTH PMN AND MONONUCLEAR     NO ORGANISMS SEEN     Performed at [facility]   Culture     Final   Value: RARE GROUP B STREP(S.AGALACTIAE)ISOLATED     Note: SUSCEPTIBILITIES PERFORMED ON PREVIOUS CULTURE WITHIN THE LAST 5 DAYS.     Performed at [facility]   Report Status 08/07/2013 FINAL   Final  ANAEROBIC CULTURE     Status: None   Collection Time    08/04/13  4:54 PM      Result Value Ref Range Status   Specimen Description SYNOVIAL FLUID HIP RIGHT   Final   Special Requests FLUID ON SWAB PT ON VANCO ZOSYN   Final   Gram Stain     Final   Value: RARE WBC PRESENT,BOTH PMN AND MONONUCLEAR     NO ORGANISMS SEEN     Performed at [facility]   Culture     Final   Value: NO ANAEROBES ISOLATED     Performed at [facility]   Report Status 08/09/2013 FINAL   Final  CLOSTRIDIUM DIFFICILE BY PCR     Status: None   Collection Time    08/05/13  8:02 PM      Result Value Ref Range Status   C difficile by pcr NEGATIVE  NEGATIVE Final   Studies/Results: No results found. Medications: I have reviewed the patient's current medications. Scheduled Meds: . atazanavir  300 mg Oral Q breakfast  . cefTRIAXone (ROCEPHIN)  IV  2 g Intravenous Q24H  . collagenase    Topical Daily  . docusate sodium  100 mg Oral BID  . feeding supplement (ENSURE COMPLETE)  237 mL Oral TID BM  . fludrocortisone  0.1 mg Oral Daily  . hydrocortisone sodium succinate  50 mg Intravenous Q6H  . lamiVUDine-zidovudine  1 tablet Oral BID  . midodrine  10 mg Oral TID WC  . nystatin  5 mL Oral QID  . ritonavir  100 mg Oral Q breakfast  . sodium chloride  10-40 mL Intracatheter Q12H  . sulfamethoxazole-trimethoprim  1 tablet Oral Daily  . tenofovir  300 mg Oral Daily   Continuous Infusions:  PRN Meds:.acetaminophen, acetaminophen, atropine, HYDROcodone-acetaminophen, HYDROmorphone (DILAUDID) injection, loperamide, menthol-cetylpyridinium, ondansetron (ZOFRAN) IV, ondansetron, phenol, polyethylene glycol, sodium chloride, sodium chloride  Assessment/Plan: Principal Problem:   Septic arthritis of hip Active Problems:   HIV DISEASE   ANEMIA, CHRONIC   Decubitus ulcer of sacral region, stage 4   Protein-calorie malnutrition, severe   Septic shock(785.52)  Septic Arthritis of Right Hip in setting of stage 4 decubitus ulcer: Patient appears to be recovering well post-surgery POD5 and  post-debridement by general surgery. He has been afebrile since switching to Rocephin for pan-sensitive group B strep.  Per ID he will need 6 weeks of antibiotics.  His ulcer has been debrided with all necrotic tissue removed.  PT/OT going well with LTACH recommended on discharge. -appreciate orthopedics, general surgery, ID, and wound team  -continue ceftriaxone 2 g iv daily -rotate frequently to relieve pressure form ulcer -acetaminophen 650 mg q6hprn, hydrocodone-acetaminophen 5-325 1-2 tab po q4hprn, hydromorphone 0.5-1 mg iv q3hprn for pain -Advance to regular diet. -Nutrition recommends chocolate Ensure Complete PO TID, RD following. -D/C Foley today to prevent infection.  Macrocytic Anemia: Hgb 7.5 up from 7.0 yesterday. Baseline is 12. He has macrocytic anemia with normal vitamin b12 and  folate mostly due to anti-retroviral therapy and anemia of chronic disease. Surgical losses possible. -Monitor for lightheadedness and weakness during PT/OT. -Cont to monitor CBC.  HIV: Pt is followed by [name], ID, who was informed of hospitalization. Patient CD4 on admission is 140 with viral load 251, stable from 4 months ago. CD4 <200 requires PCP prophylaxis. On atazanavir, combivir, norvir, and tenofovir at home. He admits he has missed a few doses recently.  -Continue Atazanavir 300 mg daily  -Continue Lamivudine-Zidovudine 150-300 mg daily  -Continue ritonavir 100 mg daily  -Continue Tenofovir 300 mg daily  -Bactrim 1 double strength tab daily  -Appreciate ID recs  History of Squamous Cell Cancer of the Anus in 2008, s/p radiation and chemotherapy: Follows with Dr. Dalbert Batman of [facility]. CT in February 2015 showed no recurrence of cancer, infection, adenopathy or hernia. CT showed post-radiation changes. Dr. Dalbert Batman informed of hospitalization.   History of DVT: LLE DVT in August of 2008. Complication of radiation and chemotherapy. No lower extremity edema, no calf tenderness. No longer on coumadin at home. PT 19.2 INR 1.62  -holding enoxaparin 40 mg [place] due to low platelets -SCDs  Hypokalemia: K of 2.6 despite Kdur 40 meq yesterday, no EKG changes.  Supplemented with additional 40 meq this morning. -Recheck BMP at 10 am.  Dispo: Disposition is deferred at this time, awaiting improvement of current medical problems.  Anticipated discharge in approximately 3 day(s).   The patient does have a current PCP [name], MD) and does need an [facility] hospital follow-up appointment after discharge.  The patient does not know have transportation limitations that hinder transportation to clinic appointments.  .Services Needed at time of discharge: Y = Yes, Blank = No PT:   OT:   RN:   Equipment:   Other: LTACH    LOS: 11 days   Arman Filter, MD 08/10/2013, 8:53  AM

## 2013-08-10 NOTE — Progress Notes (Signed)
[facility] and [name] would like to meet with nurse case managers. Talked with Jackelyn Poling and/or Judson Roch and would like to schedule a meeting. Would like to meet tomorrow, please call.  [name] [phone] [name] [phone]  Thanks!  Penni Bombard, RN 7:38 PM 08/10/2013

## 2013-08-10 NOTE — Progress Notes (Signed)
1 Day Post-Op  Subjective: Pain reasonably controlled.    Objective: Vital signs in last 24 hours: Temp:  [97.3 F (36.3 C)-98.2 F (36.8 C)] 98.2 F (36.8 C) (08/02 0750) Pulse Rate:  [49-56] 53 (08/02 0750) Resp:  [14-36] 20 (08/02 0750) BP: (106-164)/(65-95) 139/65 mmHg (08/02 0750) SpO2:  [91 %-100 %] 98 % (08/02 0750) Last BM Date: 08/08/13  Intake/Output from previous day: 08/01 0701 - 08/02 0700 In: 300 [I.V.:300] Out: 6585 [Urine:6575; Blood:10] Intake/Output this shift:    General appearance: alert, cooperative and no distress Resp: breathing comfortably GI: soft, non tender Wound not examined today as patient is eating breakfast.  Lab Results:   Recent Labs  08/09/13 0530 08/10/13 0430  WBC 3.8* 5.4  HGB 7.0* 7.5*  HCT 20.7* 22.3*  PLT 142* 167   BMET  Recent Labs  08/09/13 0530 08/10/13 0430  NA 141 138  K 3.4* 2.6*  CL 108 98  CO2 22 29  GLUCOSE 120* 116*  BUN 15 9  CREATININE 0.73 0.61  CALCIUM 7.1* 7.2*   PT/INR No results found for this basename: LABPROT, INR,  in the last 72 hours ABG No results found for this basename: PHART, PCO2, PO2, HCO3,  in the last 72 hours  Studies/Results: No results found.  Anti-infectives: Anti-infectives   Start     Dose/Rate Route Frequency Ordered Stop   08/06/13 2130  cefTRIAXone (ROCEPHIN) 2 g in dextrose 5 % 50 mL IVPB     2 g 100 mL/hr over 30 Minutes Intravenous Every 24 hours 08/06/13 2019 09/17/13 2129   08/04/13 1659  tobramycin (NEBCIN) powder  Status:  Discontinued       As needed 08/04/13 1659 08/04/13 1826   08/04/13 1658  vancomycin (VANCOCIN) powder  Status:  Discontinued       As needed 08/04/13 1658 08/04/13 1826   08/03/13 0400  vancomycin (VANCOCIN) 500 mg in sodium chloride 0.9 % 100 mL IVPB  Status:  Discontinued     500 mg 100 mL/hr over 60 Minutes Intravenous Every 8 hours 08/02/13 2241 08/04/13 0903   08/01/13 1100  vancomycin (VANCOCIN) 500 mg in sodium chloride 0.9 % 100  mL IVPB  Status:  Discontinued     500 mg 100 mL/hr over 60 Minutes Intravenous Every 8 hours 08/01/13 1057 08/02/13 2241   08/01/13 1000  lamiVUDine-zidovudine (COMBIVIR) 150-300 MG per tablet 1 tablet     1 tablet Oral 2 times daily 07/31/13 0346     07/31/13 1615  sulfamethoxazole-trimethoprim (BACTRIM DS) 800-160 MG per tablet 1 tablet     1 tablet Oral Daily 07/31/13 1402     07/31/13 1415  sulfamethoxazole-trimethoprim (BACTRIM DS) 800-160 MG per tablet 1 tablet  Status:  Discontinued     1 tablet Oral Every 12 hours 07/31/13 1400 07/31/13 1402   07/31/13 1400  piperacillin-tazobactam (ZOSYN) IVPB 3.375 g  Status:  Discontinued     3.375 g 12.5 mL/hr over 240 Minutes Intravenous 3 times per day 07/31/13 1208 08/06/13 2019   07/31/13 1000  tenofovir (VIREAD) tablet 300 mg     300 mg Oral Daily 07/31/13 0346     07/31/13 1000  cefTRIAXone (ROCEPHIN) 1 g in dextrose 5 % 50 mL IVPB  Status:  Discontinued     1 g 100 mL/hr over 30 Minutes Intravenous Every 12 hours 07/31/13 0331 07/31/13 1208   07/31/13 0800  vancomycin (VANCOCIN) 500 mg in sodium chloride 0.9 % 100 mL IVPB  Status:  Discontinued     500 mg 100 mL/hr over 60 Minutes Intravenous Every 8 hours 07/31/13 0331 07/31/13 1543   07/31/13 0700  atazanavir (REYATAZ) capsule 300 mg     300 mg Oral Daily with breakfast 07/31/13 0346     07/31/13 0700  ritonavir (NORVIR) tablet 100 mg     100 mg Oral Daily with breakfast 07/31/13 0346     07/31/13 0030  cefTRIAXone (ROCEPHIN) 2 g in dextrose 5 % 50 mL IVPB     2 g 100 mL/hr over 30 Minutes Intravenous  Once 07/31/13 0019 07/31/13 0115   07/31/13 0000  vancomycin (VANCOCIN) IVPB 1000 mg/200 mL premix     1,000 mg 200 mL/hr over 60 Minutes Intravenous  Once 07/30/13 2345 07/31/13 0302      Assessment/Plan: s/p Procedure(s): IRRIGATION AND DEBRIDEMENT SACRAL DECUBITUS (N/A) OK for diet advance as far as we are concerned Recommend plastic surgery eval (non urgent) to see about  potential for coverage once infection cleared.   Wet to dry dressing twice daily to wound.     LOS: 11 days    Kollyns Mickelson 08/10/2013

## 2013-08-11 ENCOUNTER — Encounter (HOSPITAL_COMMUNITY): Payer: Self-pay | Admitting: Surgery

## 2013-08-11 LAB — BASIC METABOLIC PANEL
Anion gap: 9 (ref 5–15)
BUN: 11 mg/dL (ref 6–23)
CO2: 28 mEq/L (ref 19–32)
CREATININE: 0.67 mg/dL (ref 0.50–1.35)
Calcium: 7.3 mg/dL — ABNORMAL LOW (ref 8.4–10.5)
Chloride: 101 mEq/L (ref 96–112)
GFR calc non Af Amer: >90 mL/min (ref >90)
GLUCOSE: 131 mg/dL — AB (ref 70–99)
Potassium: 3.3 mEq/L — ABNORMAL LOW (ref 3.7–5.3)
Sodium: 138 mEq/L (ref 137–147)

## 2013-08-11 LAB — IRON AND TIBC
Iron: 83 ug/dL (ref 42–135)
Saturation Ratios: 35 % (ref 20–55)
TIBC: 237 ug/dL (ref 215–435)
UIBC: 154 ug/dL (ref 125–400)

## 2013-08-11 LAB — CBC
HCT: 25.1 % — ABNORMAL LOW (ref 39.0–52.0)
HEMOGLOBIN: 8.3 g/dL — AB (ref 13.0–17.0)
MCH: 32.4 pg (ref 26.0–34.0)
MCHC: 33.1 g/dL (ref 30.0–36.0)
MCV: 98 fL (ref 78.0–100.0)
Platelets: 172 10*3/uL (ref 150–400)
RBC: 2.56 MIL/uL — ABNORMAL LOW (ref 4.22–5.81)
RDW: 16.7 % — ABNORMAL HIGH (ref 11.5–15.5)
WBC: 7.4 10*3/uL (ref 4.0–10.5)

## 2013-08-11 LAB — VITAMIN B12: VITAMIN B 12: 647 pg/mL (ref 211–911)

## 2013-08-11 LAB — RETICULOCYTES
RBC.: 2.59 MIL/uL — ABNORMAL LOW (ref 4.22–5.81)
Retic Count, Absolute: 62.2 10*3/uL (ref 19.0–186.0)
Retic Ct Pct: 2.4 % (ref 0.4–3.1)

## 2013-08-11 LAB — FERRITIN: FERRITIN: 514 ng/mL — AB (ref 22–322)

## 2013-08-11 MED ORDER — POTASSIUM CHLORIDE CRYS ER 20 MEQ PO TBCR
40.0000 meq | EXTENDED_RELEASE_TABLET | Freq: Two times a day (BID) | ORAL | Status: AC
  Administered 2013-08-11 – 2013-08-12 (×3): 40 meq via ORAL
  Filled 2013-08-11 (×3): qty 2

## 2013-08-11 NOTE — Progress Notes (Signed)
Patient ID: [name] [name], male   DOB: 03/07/1960, 53 y.o.   MRN: [id number]         [facility]    Date of Admission:  07/30/2013   Total days of antibiotics 12         Principal Problem:   Septic arthritis of hip Active Problems:   HIV DISEASE   ANEMIA, CHRONIC   Decubitus ulcer of sacral region, stage 4   Protein-calorie malnutrition, severe   Septic shock(785.52)   . atazanavir  300 mg Oral Q breakfast  . cefTRIAXone (ROCEPHIN)  IV  2 g Intravenous Q24H  . docusate sodium  100 mg Oral BID  . feeding supplement (ENSURE COMPLETE)  237 mL Oral TID BM  . fludrocortisone  0.1 mg Oral Daily  . hydrocortisone sodium succinate  50 mg Intravenous Q6H  . lamiVUDine-zidovudine  1 tablet Oral BID  . midodrine  10 mg Oral TID WC  . nystatin  5 mL Oral QID  . potassium chloride  40 mEq Oral BID  . ritonavir  100 mg Oral Q breakfast  . sodium chloride  10-40 mL Intracatheter Q12H  . sulfamethoxazole-trimethoprim  1 tablet Oral Daily  . tenofovir  300 mg Oral Daily    Objective: Temp:  [97.4 F (36.3 C)-98.3 F (36.8 C)] 97.9 F (36.6 C) (08/03 1400) Pulse Rate:  [48-78] 57 (08/03 1400) Resp:  [17-18] 17 (08/03 1400) BP: (90-130)/(56-69) 90/56 mmHg (08/03 1400) SpO2:  [98 %-99 %] 99 % (08/03 1400)  Lab Results Lab Results  Component Value Date   WBC 7.4 08/11/2013   HGB 8.3* 08/11/2013   HCT 25.1* 08/11/2013   MCV 98.0 08/11/2013   PLT 172 08/11/2013    Lab Results  Component Value Date   CREATININE 0.67 08/11/2013   BUN 11 08/11/2013   NA 138 08/11/2013   K 3.3* 08/11/2013   CL 101 08/11/2013   CO2 28 08/11/2013     Assessment: He will need to continue IV ceftriaxone a minimum of 6 weeks.  Plan: 1. Continue ceftriaxone for at least 30 more days through September 2 2. Continue antiretroviral therapy 3. I will arrange followup in our clinic 4. I will sign off now but please call if we can be of further assistance  Michel Bickers, MD [facility]  for [place] [phone] pager   [phone] cell 08/11/2013, 4:45 PM

## 2013-08-11 NOTE — Progress Notes (Signed)
Patient ID: [name] [name], male   DOB: 1960-06-11, 53 y.o.   MRN: [id number] 2 Days Post-Op  Subjective: Patient without complaints  Objective: Vital signs in last 24 hours: Temp:  [97.4 F (36.3 C)-98.3 F (36.8 C)] 97.4 F (36.3 C) (08/03 0534) Pulse Rate:  [48-78] 48 (08/03 0534) Resp:  [16-18] 18 (08/03 0741) BP: (106-139)/(60-69) 106/69 mmHg (08/03 0534) SpO2:  [96 %-99 %] 98 % (08/03 0741) Last BM Date: 08/10/13  Intake/Output from previous day: 08/02 0701 - 08/03 0700 In: 6 [P.O.:540; I.V.:80] Out: 3077 [Urine:3075; Stool:2] Intake/Output this shift: Total I/O In: -  Out: 200 [Urine:200]  PE: Skin: Stage IV decubitus ulcer noted. The tissue part is clean. Most of this wound is exposed sacrum. The sacrum is stained a dark green, black color.  Lab Results:   Recent Labs  08/10/13 0430 08/11/13 0520  WBC 5.4 7.4  HGB 7.5* 8.3*  HCT 22.3* 25.1*  PLT 167 172   BMET  Recent Labs  08/10/13 1820 08/11/13 0520  NA 136* 138  K 3.3* 3.3*  CL 97 101  CO2 27 28  GLUCOSE 148* 131*  BUN 11 11  CREATININE 0.69 0.67  CALCIUM 7.3* 7.3*   PT/INR No results found for this basename: LABPROT, INR,  in the last 72 hours CMP     Component Value Date/Time   NA 138 08/11/2013 0520   K 3.3* 08/11/2013 0520   CL 101 08/11/2013 0520   CO2 28 08/11/2013 0520   GLUCOSE 131* 08/11/2013 0520   BUN 11 08/11/2013 0520   CREATININE 0.67 08/11/2013 0520   CREATININE 1.10 03/28/2013 1130   CALCIUM 7.3* 08/11/2013 0520   PROT 8.3 07/30/2013 2335   ALBUMIN 2.0* 07/30/2013 2335   AST 34 07/30/2013 2335   ALT 21 07/30/2013 2335   ALKPHOS 97 07/30/2013 2335   BILITOT 0.7 07/30/2013 2335   GFRNONAA >90 08/11/2013 0520   GFRAA >90 08/11/2013 0520   Lipase  No results found for this basename: lipase       Studies/Results: No results found.  Anti-infectives: Anti-infectives   Start     Dose/Rate Route Frequency Ordered Stop   08/06/13 2130  cefTRIAXone (ROCEPHIN) 2 g in dextrose 5 % 50 mL  IVPB     2 g 100 mL/hr over 30 Minutes Intravenous Every 24 hours 08/06/13 2019 09/17/13 2129   08/04/13 1659  tobramycin (NEBCIN) powder  Status:  Discontinued       As needed 08/04/13 1659 08/04/13 1826   08/04/13 1658  vancomycin (VANCOCIN) powder  Status:  Discontinued       As needed 08/04/13 1658 08/04/13 1826   08/03/13 0400  vancomycin (VANCOCIN) 500 mg in sodium chloride 0.9 % 100 mL IVPB  Status:  Discontinued     500 mg 100 mL/hr over 60 Minutes Intravenous Every 8 hours 08/02/13 2241 08/04/13 0903   08/01/13 1100  vancomycin (VANCOCIN) 500 mg in sodium chloride 0.9 % 100 mL IVPB  Status:  Discontinued     500 mg 100 mL/hr over 60 Minutes Intravenous Every 8 hours 08/01/13 1057 08/02/13 2241   08/01/13 1000  lamiVUDine-zidovudine (COMBIVIR) 150-300 MG per tablet 1 tablet     1 tablet Oral 2 times daily 07/31/13 0346     07/31/13 1615  sulfamethoxazole-trimethoprim (BACTRIM DS) 800-160 MG per tablet 1 tablet     1 tablet Oral Daily 07/31/13 1402     07/31/13 1415  sulfamethoxazole-trimethoprim (BACTRIM DS) 800-160 MG per tablet  1 tablet  Status:  Discontinued     1 tablet Oral Every 12 hours 07/31/13 1400 07/31/13 1402   07/31/13 1400  piperacillin-tazobactam (ZOSYN) IVPB 3.375 g  Status:  Discontinued     3.375 g 12.5 mL/hr over 240 Minutes Intravenous 3 times per day 07/31/13 1208 08/06/13 2019   07/31/13 1000  tenofovir (VIREAD) tablet 300 mg     300 mg Oral Daily 07/31/13 0346     07/31/13 1000  cefTRIAXone (ROCEPHIN) 1 g in dextrose 5 % 50 mL IVPB  Status:  Discontinued     1 g 100 mL/hr over 30 Minutes Intravenous Every 12 hours 07/31/13 0331 07/31/13 1208   07/31/13 0800  vancomycin (VANCOCIN) 500 mg in sodium chloride 0.9 % 100 mL IVPB  Status:  Discontinued     500 mg 100 mL/hr over 60 Minutes Intravenous Every 8 hours 07/31/13 0331 07/31/13 1543   07/31/13 0700  atazanavir (REYATAZ) capsule 300 mg     300 mg Oral Daily with breakfast 07/31/13 0346     07/31/13 0700   ritonavir (NORVIR) tablet 100 mg     100 mg Oral Daily with breakfast 07/31/13 0346     07/31/13 0030  cefTRIAXone (ROCEPHIN) 2 g in dextrose 5 % 50 mL IVPB     2 g 100 mL/hr over 30 Minutes Intravenous  Once 07/31/13 0019 07/31/13 0115   07/31/13 0000  vancomycin (VANCOCIN) IVPB 1000 mg/200 mL premix     1,000 mg 200 mL/hr over 60 Minutes Intravenous  Once 07/30/13 2345 07/31/13 0302       Assessment/Plan  1. Stage IV sacral decubitus ulcer with exposed sacrum, status post debridement, POD2 2. Multiple other medical problems  Plan: 1. The wound is essentially clean; however, the bone is stained with a black/green film. I will discuss with Dr. Redmond Pulling to see if he thinks hydrotherapy may clean this up a little bit. There is nothing further to debride on this wound. A nonurgent plastic surgery consultation would be appropriate to determine if he is a candidate for a flap.   LOS: 12 days    Jamilah Jean E 08/11/2013, 9:16 AM Pager: [phone]

## 2013-08-11 NOTE — Progress Notes (Signed)
Patient ID: [name] [name], male   DOB: [birth year], 53 y.o.   MRN: [id number] Subjective:  Doing well with regards to his right hip.  Related recent sacral debridement and potential plans   Patient reports pain as mild in right hip  Objective:   VITALS:   Filed Vitals:   08/11/13 0534  BP: 106/69  Pulse: 48  Temp: 97.4 F (36.3 C)  Resp: 18    Neurovascular intact Incision: dressing C/D/I Dressing changed, incision healing well, no drainage  LABS  Recent Labs  08/09/13 0530 08/10/13 0430 08/11/13 0520  HGB 7.0* 7.5* 8.3*  HCT 20.7* 22.3* 25.1*  WBC 3.8* 5.4 7.4  PLT 142* 167 172     Recent Labs  08/10/13 1215 08/10/13 1820 08/11/13 0520  NA 138 136* 138  K 2.7* 3.3* 3.3*  BUN 8 11 11   CREATININE 0.57 0.69 0.67  GLUCOSE 112* 148* 131*    No results found for this basename: LABPT, INR,  in the last 72 hours   Assessment/Plan: POD #6 status post right hip resection, antibiotic spacer  Continue ABX therapy at least 5 more weeks due to septic right hip with iliopsoas abcess  PWB RLE  Prior to reimplantation for total hip would like for sacral decubitus issues resolved

## 2013-08-11 NOTE — Progress Notes (Signed)
Physical Therapy Treatment Patient Details Name: [name] [name] MRN: [id number] DOB: March 22, 1960 Today's Date: 08/11/2013    History of Present Illness 53 year old, HIV positive admitted 7/22 with septic arthritis of right hip and abscess extending into the pelvis, underwent I&D and drainage on 7/23 followed by septic shock, hypotensive in spite of full resuscitation. Hence PCCM consulted 7/24. Had hip resection on 08/04/13 with cement placement. Has posterior hip precautions and is PWB right LE.     PT Comments    Found out pt had not gotten out of bed in 4-5 days.  Reinforced all transfer/safety issues.   Reinitiated exercise program and assist pt up for walk in RW  Follow Up Recommendations  SNF     Equipment Recommendations   (TBA)    Recommendations for Other Services       Precautions / Restrictions Precautions Precautions: Posterior Hip;Fall Precaution Comments: pt related 3/3 posterior THA precautions. Restrictions RLE Weight Bearing: Partial weight bearing RLE Partial Weight Bearing Percentage or Pounds: 50    Mobility  Bed Mobility Overal bed mobility: Needs Assistance;+ 2 for safety/equipment Bed Mobility: Sit to Supine Rolling: Min assist Sidelying to sit: +2 for physical assistance;Mod assist Supine to sit: Min assist;+2 for safety/equipment     General bed mobility comments: cues for technique and minimal truncal assist  Transfers Overall transfer level: Needs assistance Equipment used: Rolling walker (2 wheeled) Transfers: Sit to/from Stand Sit to Stand: Min assist;+2 safety/equipment         General transfer comment: cues for hand placement and R LE positioning to stand.  Minimal lift and steady assist  Ambulation/Gait Ambulation/Gait assistance: Min guard Ambulation Distance (Feet): 16 Feet Assistive device: Rolling walker (2 wheeled) Gait Pattern/deviations: Step-to pattern Gait velocity: slow and guarded Gait velocity interpretation: Below  normal speed for age/gender General Gait Details: cues for sequencing and use of RW to maintain PWB   Stairs            Wheelchair Mobility    Modified Rankin (Stroke Patients Only)       Balance Overall balance assessment: Needs assistance   Sitting balance-Leahy Scale: Fair     Standing balance support: Bilateral upper extremity supported Standing balance-Leahy Scale: Poor                      Cognition Arousal/Alertness: Awake/alert Behavior During Therapy: WFL for tasks assessed/performed Overall Cognitive Status: Within Functional Limits for tasks assessed                      Exercises Total Joint Exercises Ankle Circles/Pumps: AROM;20 reps;Seated Quad Sets: AROM;10 reps;Seated;Both Gluteal Sets: AROM;Both;10 reps;Seated    General Comments        Pertinent Vitals/Pain     Home Living                      Prior Function            PT Goals (current goals can now be found in the care plan section) Acute Rehab PT Goals Patient Stated Goal: to go home PT Goal Formulation: With patient Time For Goal Achievement: 08/19/13 Potential to Achieve Goals: Good Progress towards PT goals: Progressing toward goals    Frequency  Min 3X/week    PT Plan Current plan remains appropriate    Co-evaluation             End of Session   Activity Tolerance: Patient tolerated treatment  well Patient left: in chair;with call bell/phone within reach;with nursing/sitter in room     Time: 1110-1154 PT Time Calculation (min): 44 min  Charges:  $Gait Training: 8-22 mins $Therapeutic Exercise: 8-22 mins                    G Codes:      Kassidy Dockendorf, Tessie Fass 08/11/2013, 1:00 PM 08/11/2013  Donnella Sham, PT [phone]  (pager)

## 2013-08-11 NOTE — Progress Notes (Signed)
Discussed with PA Chart reviewed Nothing addl to add/offer from general surgery  Nutrition, nutrition, nutrition Non-urgent outpt plastic surgery evaluation  Leighton Ruff. Redmond Pulling, MD, FACS General, Bariatric, & Minimally Invasive Surgery [facility], [place]

## 2013-08-11 NOTE — Progress Notes (Signed)
Internal Medicine Attending  Date: 08/11/2013  Patient name: [name] [name] Medical record number: [id number] Date of birth: [birth year] Age: 53 y.o. Gender: male  I saw and evaluated the patient, and discussed his care with resident on A.M rounds.  I reviewed the resident's note by Dr. Trudee Kuster and I agree with the resident's findings and plans as documented in his note, with the following additional comments.  Will need to clarify whether a SNF can provide the needed wound care and 6-week course of IV antibiotics.

## 2013-08-11 NOTE — Discharge Summary (Signed)
Name: [name] [name] MRN: [id number] DOB: 11-28-1960 53 y.o. PCP: [name], MD  Date of Admission: 07/30/2013 11:23 PM Date of Discharge: 08/13/2013 Attending Physician: Axel Filler, MD  Discharge Diagnosis:  Principal Problem:   Septic arthritis of hip Active Problems:   HIV DISEASE   ANEMIA, CHRONIC   Decubitus ulcer of sacral region, stage 4   Protein-calorie malnutrition, severe   Septic shock(785.52)  Discharge Medications:   Medication List    STOP taking these medications       acetaminophen 500 MG tablet  Commonly known as:  TYLENOL     HYDROmorphone 2 MG tablet  Commonly known as:  DILAUDID      TAKE these medications       atazanavir 300 MG capsule  Commonly known as:  REYATAZ  Take 300 mg by mouth daily with breakfast.     cefTRIAXone 2 g in dextrose 5 % 50 mL  Inject 2 g into the vein daily.     DSS 100 MG Caps  Take 100 mg by mouth 2 (two) times daily.     feeding supplement (ENSURE COMPLETE) Liqd  Take 237 mLs by mouth 3 (three) times daily between meals.     HYDROcodone-acetaminophen 5-325 MG per tablet  Commonly known as:  NORCO/VICODIN  Take 1-2 tablets by mouth every 6 (six) hours as needed (breakthrough pain).     hydrocortisone sodium succinate 100 MG Solr injection  Commonly known as:  SOLU-CORTEF  Inject 0.5 mLs (25 mg total) into the vein every 6 (six) hours.     lamiVUDine-zidovudine 150-300 MG per tablet  Commonly known as:  COMBIVIR  Take 1 tablet by mouth 2 (two) times daily.     midodrine 10 MG tablet  Commonly known as:  PROAMATINE  Take 1 tablet (10 mg total) by mouth 3 (three) times daily with meals.     potassium chloride SA 20 MEQ tablet  Commonly known as:  K-DUR,KLOR-CON  Take 2 tablets (40 mEq total) by mouth daily.     ritonavir 100 MG Tabs tablet  Commonly known as:  NORVIR  Take 1 tablet (100 mg total) by mouth daily.     sodium chloride 0.9 % injection  10-40 mLs by Intracatheter route  every 12 (twelve) hours.     sodium chloride 0.9 % injection  10-40 mLs by Intracatheter route as needed (flush).     sulfamethoxazole-trimethoprim 800-160 MG per tablet  Commonly known as:  BACTRIM DS  Take 1 tablet by mouth daily.     tenofovir 300 MG tablet  Commonly known as:  VIREAD  Take 1 tablet (300 mg total) by mouth daily.        Disposition and follow-up:   Mr.[name] [name] was discharged from [facility] in Stable condition to [place].  At [facility], please address:  1.  Continued use of antibiotics for 6 weeks total (until 09/10/13), healing of decubitus ulcer, resolution of hypokalemia, removal of staples by 08/15/13, wean steroid and midodrine (as below) -Continue hydrocortisone 25 mg q6h for three days, then 25 mg BID for three days, then stop. -Wean midodrine as blood pressure tolerates. -Wound care for sacral decubitus ulcer, wet to dry dressing twice daily.  2.  Labs / imaging needed at time of follow-up: BMP, CBC with diff, CD4 count  3.  Pending labs/ test needing follow-up: HIV1 RNA quant.  Follow-up Appointments: Follow-up Information   Follow up with [name], MD  In 2 weeks.   Specialty:  Orthopedic Surgery   Contact information:   [address] [place] [address] [phone]       Follow up with [name], MD On 08/27/2013. (11:30 am)    Specialty:  Infectious Diseases   Contact information:   [place] [address] [place] [address] [phone]       Discharge Instructions: Discharge Instructions   Diet - low sodium heart healthy    Complete by:  As directed      For home use only DME Bedside commode    Complete by:  As directed      For home use only DME Tub bench    Complete by:  As directed      Increase activity slowly    Complete by:  As directed            Thank you for allowing [place] to be involved in your healthcare while you were hospitalized at [facility].   Please note that there have been changes to your home medications.  --> PLEASE LOOK AT YOUR DISCHARGE MEDICATION LIST FOR DETAILS.  Please call your PCP if you have any questions or concerns, or any difficulty getting any of your medications.  Please return to the ER if you have worsening of your symptoms or new severe symptoms arise.  PARTIAL WEIGHT BEARING RIGHT LOWER EXTREMITY UNTIL FURTHER DIRECTED Posterior hip precautions  Staples out by 08/15/13  Consultations: Treatment Team:  [name], MD [name], MD  Procedures Performed:  Ct Pelvis W Contrast  07/31/2013   CLINICAL DATA:  Right hip pain. Per EPIC notes, outside "CT scan of a right hip shows evidence of an infection around the hip and tracking into the pelvis along the iliopsoas muscle. The patient will require admission to the hospital, I indicated that he should go to the emergency room tonight or tomorrow morning and seek medical attention".  EXAM: CT PELVIS WITH CONTRAST  TECHNIQUE: Multidetector CT imaging of the pelvis was performed using the standard protocol following the bolus administration of intravenous contrast.  CONTRAST:  122mL OMNIPAQUE IOHEXOL 300 MG/ML  SOLN  COMPARISON:  Abdominal pelvic CT 05/07/2007 and 02/25/2003. Outside study is unavailable.  FINDINGS: There is a large complex right hip joint effusion with fluid tracking into the iliopsoas bursa. There is a small amount of air anteriorly within the joint on image 85. Complex fluid within the iliopsoas bursa extends superiorly along the right iliacus muscle, measuring up to 8.9 x 5.2 cm transverse on image 48.  Since the prior CT, the patient has developed marked right hip joint space loss with multifocal destruction of the acetabulum consistent with osteomyelitis. The femoral head demonstrates mild rarefaction without gross cortical destruction or subchondral collapse. There is no dislocation. Apart from the acetabulum, there is no evidence  of pelvic osteomyelitis. The sacroiliac joints and lower lumbar spine appear intact.  There is decubitus ulceration over the lower sacrum and coccyx with associated soft tissue emphysema. There is no gross destruction of the sacrum or coccyx. There appears to be some induration of the skin posterolateral to the right femoral greater trochanter.  Chronic bladder wall thickening appear stable.  IMPRESSION: 1. 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 There is probable early osteomyelitis of the femoral head. 2. Associated infected iliopsoas bursa with superior extension of a large abscess into the pelvis  between the iliacus muscle and the right iliac bone. 3. Decubitus ulcer over the lower sacrum and coccyx.   Electronically Signed   By: Camie Patience M.D.   On: 07/31/2013 02:42   Dg Pelvis Portable  08/05/2013   CLINICAL DATA:  Post right hip surgery  EXAM: PORTABLE PELVIS 1-2 VIEWS  COMPARISON:  None.  FINDINGS: Single frontal view of the pelvis submitted. There is right hip prosthesis in anatomic alignment. Small amount of periarticular soft tissue air. Lateral skin staples. Postsurgical drain in place.  IMPRESSION: Right hip prosthesis in anatomic alignment.   Electronically Signed   By: Lahoma Crocker M.D.   On: 08/05/2013 07:59   Dg Chest Port 1 View  08/01/2013   CLINICAL DATA:  Central venous line placement.  EXAM: PORTABLE CHEST - 1 VIEW  COMPARISON:  08/01/2013.  FINDINGS: Central line noted with its tip projected over the cavoatrial junction. Mediastinum and hilar structures are normal.Borderline cardiomegaly, slight prominence of pulmonary vascularity. Very subtle Kerley B-lines are noted. Very mild congestive heart failure cannot be excluded. No pleural effusion or pneumothorax. No acute bony abnormality.  IMPRESSION: 1. Central line in good anatomic position. 2. Can't exclude mild congestive heart failure.   Electronically Signed   By: Marcello Moores   Register   On: 08/01/2013 15:16   Dg Chest Port 1 View  08/01/2013   CLINICAL DATA:  Postop  EXAM: PORTABLE CHEST - 1 VIEW  COMPARISON:  12/24/2012  FINDINGS: Heart, mediastinum and hila are unremarkable. Lungs are hyperexpanded with mild interstitial prominence and evidence of apical emphysema. No lung consolidation or edema. No pleural effusion or pneumothorax.  Bony thorax is intact.  IMPRESSION: No acute cardiopulmonary disease.   Electronically Signed   By: Lajean Manes M.D.   On: 08/01/2013 09:10    2D Echo:  Study Conclusions - Left ventricle: The cavity size was normal. Wall thickness was normal. Systolic function was normal. The estimated ejection fraction was in the range of 55% to 60%. Wall motion was normal; there were no regional wall motion abnormalities. Left ventricular diastolic function parameters were normal. - Right ventricle: The cavity size was mildly dilated.  Impressions: - Normal LV function; trace MR and TR; mild RVE; possible left pleural effusion/density; suggest chest xray to further assess.  Admission HPI:  Mr. 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 Jannifer Franklin. The patient was recently seen by Dr. Jannifer Franklin in outpatient neurology for evaluation of questionable neuropathic pain and had a CT R hip completed. According to note, CT showed evidence of an infection around the R hip and tracking into the pelvis along the iliopsoas muscle. Dr. Jannifer Franklin called and instructed pt and pt's sister to go to the ED.   Pt states that in December 2014, pt woke up with severe R hip pain radiating into his groin. At the time he did not have health insurance and did not have the leg evaluated. He was not able to put weight on it, but the pain resolved in 3-4 days. 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 On June 26, 2013, pt woke up again with R hip and leg pain that radiated into his R groin. Pain is worst with weight bearing and with hip and knee extension. His pain worsened over the  next month and he went to be evaluated by Dr. Dalbert Batman, surgery. According to his note, CT scan in February 2015 showed no evidence of recurrent cancer, infection, adenopathy or hernia. He was referred to neurology for neuropathic pain. He was evaluated in the ED on July 19, 2013. He was again diagnosed with neuropathy, prescribed Robaxin and set up with an outpatient neurology appointment. Pt saw Dr. Jannifer Franklin, neurology on July 23, 2013 for his R leg pain. Dr. 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 He had a CT scan done and pt was sent home with Diluadid for pain. The patient states over the past 3-4 he has not been able to put any weight on the R hip. Pain is 10/10, worse with movement and weight bearing. He has been immobile for the past 3-4 days. Pt denies IV drug use. Admits to nausea. He denies any fever, chills, chest pain, shortness of breath, or vomiting.    Hospital Course by problem list: Principal Problem:   Septic arthritis of hip Active Problems:   HIV DISEASE   ANEMIA, CHRONIC   Decubitus ulcer of sacral region, stage 4   Protein-calorie malnutrition, severe   Septic shock(785.52)   1. Septic arthritis of R hip Mr. Ng presented to the hospital on 07/30/13 with severe 10/10 pain in his R hip and unable to bear weight or flex his R hip.  He had a temp of 101.2, BP 85/62, and HR 106. CT pelvis with contrast showed septic arthritis of the right hip with osteomyelitis of the acetabulum, early osteomyelitis of the femoral head, infected iliopsoas bursa with superior extension of a large abscess into the pelvis between the iliacus muscle and right iliac bone, and a decubitus ulcer over the lower sacrum and coccyx.  The patient was  started on vancomycin and Zosyn for empiric therapy and his pain was managed with opioids.  He underwent I&D on 07/31/13 by orthopedic surgery, and he suffered septic shock post-operatively on 08/01/13.  He was resuscitated with IVFs but remained hypotensive, so he was transferred to the ICU and treated with dopamine for stabilization.  On 08/04/13, he returned to the OR with ortho for R hip resection and placement of a temporary prosthesis.  On 08/06/13, his wound culture grew GBS, and he was switched to Rocephin based on sensitivity in consultation with ID with end date 09/10/13.  PICC line is in place.  He was slowly weaned off of dopamine to midodrine 10 mg TID, Solu-cortef 50 mg q6h, and fludrocortisone 0.1 mg daily and transferred back to the floor on 08/07/13.  He remained afebrile after returning to the floor with stable blood pressure.  His pain was well-controlled on Dilaudid and Vicodin, and he was transferred to Vicodin q6h at discharge.  On the day of his discharge, Solu-Cortef was tapered by 50% to a dose of 25 mg q6h with a plan to further decrease it gradually over the next few days at [facility].  Fludrocortisone was stopped. Midodrine was continued with a plan to discontinue at [facility].  Ortho would like to follow up with the patient in one week.  The sacral decubitus ulcer will need to be resolved prior to placement of a total hip replacement.  2. Stage 4 sacral decubitus ulcer He was noted to have a large stage 4 sacral decubitus ulcer at the time of admission, that he says he noticed a few days  prior to admission while wiping after a bowel movement.  He does not report spending a significant amount of time laying on his back and is not sure how the ulcer originated.  The ulcer was considered a possible source of his septic arthritis.  Wound care was consulted who recommended general surgery consultation for possible debridement.  On 08/09/13, general surgery took him to the operating room for debridement of  his sacral decubitus ulcer.  General surgery recommended wet to dry dressing twice daily to the wound and plastic surgery evaluation for potential flap.  Plastic surgery was consulted and was concerned about the patient's ability to keep weight off of a sacral flap for 6 weeks while he healed along with his poor nutrition status.  They asked him to try positioning himself off of his sacrum to see if he could tolerate the recovery.  They will continue to follow the patient and consider the procedure.  3. HIV, CD4 count of 120 Patient is follow by [name] of ID who was informed of this hospitalization. Patient CD4 on admission was 140 with viral load of 251, stable from 4 months ago. He was continued on his home atazanavir, combivir, norvir, and tenofovir.  He received TMP-SMX for PCP prophylaxis during the hospitalization.  4. Macrocytic anemia Hgb was noted to be low at 8.5 on admission with elevated MCV, down from his baseline of 12.  His hemoglobin dropped to 7 after I&D, and he was given 1 unit or PRBCs during resuscitation.  His hemoglobin recovered to 8.5 and remained stable for the rest of his admission.  Anemia workup before discharge showed normal folate, B12, TIBC, and sat % with elevated ferritin on labs, consistent with anemia of chronic disease with possible contribution of long-term antiretroviral therapy and response to sepsis.  5. Hypokalemia Potassium was normal on admission, but persistently low after stay in ICU.  Mg was also found to be low.  Both were supplemented intermittently for the remainder of his hospitalization.  6. Cachexia and malnutrition Patient was very cachectic on presentation, likely due to known HIV infection.  Nutrition was consulted and recommended Ensure Complete PO TID.  He was followed by nutrition throughout his hospitalization.  7. History of SCC of the anus, s/p chemoradiation therapy in 2008 Mr. Stinson follows with Dr. Dalbert Batman of [facility]. CT in February 2015 showed no recurrence of cancer, infection, adenopathy or hernia. CT showed post-radiation changes. Dr. Dalbert Batman was informed of hospitalization. It was thought that his prior radiation therapy may have contributed to the development of his sacral ulcer.  8. History of DVT The patient had a LLE DVT in August 2008 thought to be a complication of radiation and chemotherapy.  He had no symptoms of DVT or PE during his admission, and he was maintained on DVT prophylaxis with enoxaparin and/or SCDs.  Discharge Vitals:   BP 128/80  Pulse 56  Temp(Src) 97.9 F (36.6 C) (Oral)  Resp 16  Ht 6' 1.5" (1.867 m)  Wt 128 lb 15.5 oz (58.5 kg)  BMI 16.78 kg/m2  SpO2 100%  Discharge Labs:  Results for orders placed during the hospital encounter of 07/30/13 (from the past 24 hour(s))  CLOSTRIDIUM DIFFICILE BY PCR     Status: None   Collection Time    08/12/13  6:40 PM      Result Value Ref Range   C difficile by pcr NEGATIVE  NEGATIVE  BASIC METABOLIC PANEL     Status: Abnormal  Collection Time    08/13/13  4:45 AM      Result Value Ref Range   Sodium 144  137 - 147 mEq/L   Potassium 3.9  3.7 - 5.3 mEq/L   Chloride 106  96 - 112 mEq/L   CO2 26  19 - 32 mEq/L   Glucose, Bld 113 (*) 70 - 99 mg/dL   BUN 15  6 - 23 mg/dL   Creatinine, Ser 0.63  0.50 - 1.35 mg/dL   Calcium 7.5 (*) 8.4 - 10.5 mg/dL   GFR calc non Af Amer >90  >90 mL/min   GFR calc Af Amer >90  >90 mL/min   Anion gap 12  5 - 15  MAGNESIUM     Status: None   Collection Time    08/13/13  4:45 AM      Result Value Ref Range   Magnesium 1.6  1.5 - 2.5 mg/dL    Signed: Arman Filter, MD 08/13/2013, 9:00 AM    Services Ordered on Discharge: LTACH. Equipment Ordered on Discharge: 3 in 1 bedside comode;Tub/shower seat.

## 2013-08-11 NOTE — Progress Notes (Addendum)
Subjective: [name] [name] reports doing well this morning without pain.  He tolerated a full diet yesterday without issue.  He continues to be afebrile and denies chest pain, SOB, nausea, vomiting, or chills.  Objective: Vital signs in last 24 hours: Filed Vitals:   08/10/13 1538 08/10/13 2212 08/11/13 0534 08/11/13 0741  BP: 106/60 130/67 106/69   Pulse: 54 78 48   Temp: 98 F (36.7 C) 98.3 F (36.8 C) 97.4 F (36.3 C)   TempSrc: Oral Oral Oral   Resp: 17 18 18 18   Height:      Weight:      SpO2: 99% 99% 98% 98%   Weight change:   Intake/Output Summary (Last 24 hours) at 08/11/13 1322 Last data filed at 08/11/13 1033  Gross per 24 hour  Intake    140 ml  Output   1227 ml  Net  -1087 ml   BP 106/69  Pulse 48  Temp(Src) 97.4 F (36.3 C) (Oral)  Resp 18  Ht 6' 1.5" (1.867 m)  Wt 128 lb 15.5 oz (58.5 kg)  BMI 16.78 kg/m2  SpO2 98%  General Appearance:    Alert, cooperative, no distress, cachetic.  HEENT:    Normocephalic, without obvious abnormality, atraumatic, PERRL, EOMI, MMM  Back:     Symmetric, no curvature, R lower back decubitus ulcer well bandaged.  Lungs:     Clear to auscultation bilaterally, no respiratory distress.  Chest wall:    No tenderness or deformity.  Heart:    Regular rate and rhythm, S1 and S2 normal, no murmur, rub   or gallop  Abdomen:     Soft, non-tender, bowel sounds active,    no masses, no organomegaly.  Extremities:   R lateral hip and upper leg with several incisions well bandaged. Bandages with dried serosanguinous fluid. No cyanosis or edema in feet. SCDs in place.  Pulses:   2+ and symmetric radial and DP pulses.   Lab Results: Basic Metabolic Panel:  Recent Labs Lab 08/07/13 0430 08/08/13 0440  08/10/13 1215 08/10/13 1820 08/11/13 0520  NA 137 139  < > 138 136* 138  K 3.4* 3.9  < > 2.7* 3.3* 3.3*  CL 106 106  < > 98 97 101  CO2 20 22  < > 29 27 28   GLUCOSE 123* 120*  < > 112* 148* 131*  BUN 14 17  < > 8 11 11     CREATININE 0.89 0.88  < > 0.57 0.69 0.67  CALCIUM 6.8* 7.0*  < > 7.2* 7.3* 7.3*  MG 2.1 1.8  --  1.3*  --   --   PHOS 3.0 2.7  --   --   --   --   < > = values in this interval not displayed. Liver Function Tests: No results found for this basename: AST, ALT, ALKPHOS, BILITOT, PROT, ALBUMIN,  in the last 168 hours No results found for this basename: LIPASE, AMYLASE,  in the last 168 hours No results found for this basename: AMMONIA,  in the last 168 hours CBC:  Recent Labs Lab 08/10/13 0430 08/11/13 0520  WBC 5.4 7.4  HGB 7.5* 8.3*  HCT 22.3* 25.1*  MCV 94.9 98.0  PLT 167 172   Cardiac Enzymes: No results found for this basename: CKTOTAL, CKMB, CKMBINDEX, TROPONINI,  in the last 168 hours BNP: No results found for this basename: PROBNP,  in the last 168 hours D-Dimer: No results found for this basename: DDIMER,  in the  last 168 hours CBG: No results found for this basename: GLUCAP,  in the last 168 hours Hemoglobin A1C: No results found for this basename: HGBA1C,  in the last 168 hours Fasting Lipid Panel: No results found for this basename: CHOL, HDL, LDLCALC, TRIG, CHOLHDL, LDLDIRECT,  in the last 168 hours Thyroid Function Tests: No results found for this basename: TSH, T4TOTAL, FREET4, T3FREE, THYROIDAB,  in the last 168 hours Coagulation: No results found for this basename: LABPROT, INR,  in the last 168 hours Anemia Panel:  Recent Labs Lab 08/11/13 1240  RETICCTPCT 2.4   Urine Drug Screen: Drugs of Abuse  No results found for this basename: labopia,  cocainscrnur,  labbenz,  amphetmu,  thcu,  labbarb    Alcohol Level: No results found for this basename: ETH,  in the last 168 hours Urinalysis:  Recent Labs Lab 08/05/13 0205  COLORURINE YELLOW  LABSPEC 1.012  PHURINE 7.0  GLUCOSEU NEGATIVE  HGBUR MODERATE*  BILIRUBINUR NEGATIVE  KETONESUR 15*  PROTEINUR NEGATIVE  UROBILINOGEN 1.0  NITRITE NEGATIVE  LEUKOCYTESUR MODERATE*    Micro  Results: Recent Results (from the past 240 hour(s))  BODY FLUID CULTURE     Status: None   Collection Time    08/04/13  4:51 PM      Result Value Ref Range Status   Specimen Description SYNOVIAL FLUID RIGHT HIP   Final   Special Requests PT ON VANCO ZOSYN   Final   Gram Stain     Final   Value: MODERATE WBC PRESENT,BOTH PMN AND MONONUCLEAR     RARE GRAM POSITIVE COCCI     IN CHAINS     Performed at [facility]   Culture     Final   Value: MODERATE GROUP B STREP(S.AGALACTIAE)ISOLATED     Performed at [facility]   Report Status 08/07/2013 FINAL   Final   Organism ID, Bacteria GROUP B STREP(S.AGALACTIAE)ISOLATED   Final  BODY FLUID CULTURE     Status: None   Collection Time    08/04/13  4:54 PM      Result Value Ref Range Status   Specimen Description SYNOVIAL FLUID HIP RIGHT   Final   Special Requests FLUID ON SWAB PT ON VANCO ZOSYN   Final   Gram Stain     Final   Value: RARE WBC PRESENT,BOTH PMN AND MONONUCLEAR     NO ORGANISMS SEEN     Performed at [facility]   Culture     Final   Value: RARE GROUP B STREP(S.AGALACTIAE)ISOLATED     Note: SUSCEPTIBILITIES PERFORMED ON PREVIOUS CULTURE WITHIN THE LAST 5 DAYS.     Performed at [facility]   Report Status 08/07/2013 FINAL   Final  ANAEROBIC CULTURE     Status: None   Collection Time    08/04/13  4:54 PM      Result Value Ref Range Status   Specimen Description SYNOVIAL FLUID HIP RIGHT   Final   Special Requests FLUID ON SWAB PT ON VANCO ZOSYN   Final   Gram Stain     Final   Value: RARE WBC PRESENT,BOTH PMN AND MONONUCLEAR     NO ORGANISMS SEEN     Performed at [facility]   Culture     Final   Value: NO ANAEROBES ISOLATED     Performed at [facility]   Report Status 08/09/2013 FINAL   Final  CLOSTRIDIUM DIFFICILE BY PCR  Status: None   Collection Time    08/05/13  8:02 PM      Result Value Ref Range Status   C difficile by pcr NEGATIVE  NEGATIVE Final    Studies/Results: No results found. Medications: I have reviewed the patient's current medications. Scheduled Meds: . atazanavir  300 mg Oral Q breakfast  . cefTRIAXone (ROCEPHIN)  IV  2 g Intravenous Q24H  . docusate sodium  100 mg Oral BID  . feeding supplement (ENSURE COMPLETE)  237 mL Oral TID BM  . fludrocortisone  0.1 mg Oral Daily  . hydrocortisone sodium succinate  50 mg Intravenous Q6H  . lamiVUDine-zidovudine  1 tablet Oral BID  . midodrine  10 mg Oral TID WC  . nystatin  5 mL Oral QID  . potassium chloride  40 mEq Oral BID  . ritonavir  100 mg Oral Q breakfast  . sodium chloride  10-40 mL Intracatheter Q12H  . sulfamethoxazole-trimethoprim  1 tablet Oral Daily  . tenofovir  300 mg Oral Daily   Continuous Infusions:  PRN Meds:.acetaminophen, acetaminophen, atropine, HYDROcodone-acetaminophen, HYDROmorphone (DILAUDID) injection, loperamide, menthol-cetylpyridinium, ondansetron (ZOFRAN) IV, ondansetron, phenol, polyethylene glycol, sodium chloride  Assessment/Plan: Principal Problem:   Septic arthritis of hip Active Problems:   HIV DISEASE   ANEMIA, CHRONIC   Decubitus ulcer of sacral region, stage 4   Protein-calorie malnutrition, severe   Septic shock(785.52)  Septic Arthritis of Right Hip in setting of stage 4 decubitus ulcer: Patient appears to be recovering well POD6 from temporary prosthetic hip replacement by orthopedics and POD2 sacral ulcer debridement by general surgery.  He has been afebrile since switching to Rocephin for pan-sensitive group B strep.  Per ID he will need 6 weeks of antibiotics.  PT/OT going well now recommending SNF on discharge.  No trouble voiding after Foley taken out.  Discussed with Dr. [name] of Ortho and Dr. Maxwell Caul of Gen Surg who both okayed transfer to a skilled nursing facility. Gen surg recommends non-urgent plastic surgery consult about flap for decub ulcer.  Tried to contact Dr. Migdalia Dk of Plastic Surgery who has not called  back. -Appreciate orthopedics, general surgery, ID, and wound team -Okay for discharge per ortho and gen surg -Plastic surgery consult pending -Continue ceftriaxone 2 g iv daily for 6 weeks -Rotate frequently to relieve pressure form ulcer -Acetaminophen 650 mg q6hprn, hydrocodone-acetaminophen 5-325 1-2 tab po q4hprn, hydromorphone 0.5-1 mg iv q3hprn for pain -Regular diet. -Nutrition recommends chocolate Ensure Complete PO TID, RD following.  Macrocytic Anemia: Hgb 8.3 up from 7.5 yesterday. Baseline is 12. Macrocytic anemia. Folate 5.5 and vitamin B12 247 normal on 07/31/13.  Likely due to anti-retroviral therapy and anemia of chronic disease. Surgical losses possible. No recent iron/ferritin/TIBC. -Anemia panel. -Cont to monitor CBC.  HIV: Pt is followed by [name], ID, who was informed of hospitalization. Patient CD4 on admission is 140 with viral load 251, stable from 4 months ago. CD4 <200 requires PCP prophylaxis. On atazanavir, combivir, norvir, and tenofovir at home. He admits he has missed a few doses recently.  -Continue Atazanavir 300 mg daily  -Continue Lamivudine-Zidovudine 150-300 mg daily  -Continue ritonavir 100 mg daily  -Continue Tenofovir 300 mg daily  -Bactrim 1 double strength tab daily  -Appreciate ID recs  History of Squamous Cell Cancer of the Anus in 2008, s/p radiation and chemotherapy: Follows with Dr. Dalbert Batman of [facility]. CT in February 2015 showed no recurrence of cancer, infection, adenopathy or hernia. CT showed post-radiation changes. Dr.  Dalbert Batman informed of hospitalization. May have contributed to sacral ulcer.  History of DVT: LLE DVT in August of 2008. Complication of radiation and chemotherapy. No lower extremity edema, no calf tenderness. No longer on coumadin at home. -Holding enoxaparin 40 mg [place] due to low platelets -SCDs  Hypokalemia: K of 3.3 this morning up from 2.6 yesterday morning, received Kdur 40 mEq x3 yesterday. -Kdur 40  mEq for 3 doses. -Follow BMP and magnesium.  Dispo: Disposition is deferred at this time, awaiting improvement of current medical problems.  Anticipated discharge in approximately 1-2 day(s).   The patient does have a current PCP [name], MD) and does need an [facility] hospital follow-up appointment after discharge.  The patient does not know have transportation limitations that hinder transportation to clinic appointments.  .Services Needed at time of discharge: Y = Yes, Blank = No PT:   OT:   RN:   Equipment:   Other: SNF    LOS: 12 days   Arman Filter, MD 08/11/2013, 1:22 PM

## 2013-08-11 NOTE — Consult Note (Signed)
WOC follow-up: Ortho service following for assessment and plan of care to hip post-op wound and surgical team is following for post-op wound to sacrum. Please re-consult if further assistance is needed.  Thank-you,  Julien Girt MSN, [place], [place], [place], [place]

## 2013-08-12 LAB — BASIC METABOLIC PANEL
Anion gap: 13 (ref 5–15)
BUN: 13 mg/dL (ref 6–23)
CALCIUM: 7.6 mg/dL — AB (ref 8.4–10.5)
CO2: 25 mEq/L (ref 19–32)
Chloride: 101 mEq/L (ref 96–112)
Creatinine, Ser: 0.69 mg/dL (ref 0.50–1.35)
GFR calc Af Amer: >90 mL/min (ref >90)
GLUCOSE: 107 mg/dL — AB (ref 70–99)
Potassium: 3.5 mEq/L — ABNORMAL LOW (ref 3.7–5.3)
Sodium: 139 mEq/L (ref 137–147)

## 2013-08-12 LAB — CBC
HCT: 24.3 % — ABNORMAL LOW (ref 39.0–52.0)
Hemoglobin: 8.1 g/dL — ABNORMAL LOW (ref 13.0–17.0)
MCH: 32.5 pg (ref 26.0–34.0)
MCHC: 33.3 g/dL (ref 30.0–36.0)
MCV: 97.6 fL (ref 78.0–100.0)
PLATELETS: 192 10*3/uL (ref 150–400)
RBC: 2.49 MIL/uL — ABNORMAL LOW (ref 4.22–5.81)
RDW: 17.1 % — ABNORMAL HIGH (ref 11.5–15.5)
WBC: 8.4 10*3/uL (ref 4.0–10.5)

## 2013-08-12 LAB — MAGNESIUM: Magnesium: 1.5 mg/dL (ref 1.5–2.5)

## 2013-08-12 MED ORDER — POTASSIUM CHLORIDE CRYS ER 20 MEQ PO TBCR
40.0000 meq | EXTENDED_RELEASE_TABLET | Freq: Two times a day (BID) | ORAL | Status: DC
  Administered 2013-08-12 – 2013-08-13 (×2): 40 meq via ORAL
  Filled 2013-08-12 (×2): qty 2

## 2013-08-12 NOTE — Progress Notes (Signed)
Case discussed with PA  Leighton Ruff. Redmond Pulling, MD, FACS General, Bariatric, & Minimally Invasive Surgery [facility], [place]

## 2013-08-12 NOTE — Progress Notes (Signed)
[facility] Progress Note  3 Days Post-Op   Pt's wound is clean, continue WD dressing changes BID.  Pt pending LTAC or SNF placment.  We feel he would greatly benefit from [facility] given the extensive dressing he needs to have attended to.  Would need OP referral to wound center and follow up with Dr. Theodoro Kos for possible flap in the future.  Does not need follow up with [facility].     June [place], [place] 08/12/2013, 10:18 AM Pager: [phone]

## 2013-08-12 NOTE — Progress Notes (Signed)
Occupational Therapy Treatment Patient Details Name: [name] [name] MRN: [id number] DOB: 02/06/1960 Today's Date: 08/12/2013    History of present illness 53 year old, HIV positive admitted 7/22 with septic arthritis of right hip and abscess extending into the pelvis, underwent I&D and drainage on 7/23 followed by septic shock, hypotensive in spite of full resuscitation. Hence PCCM consulted 7/24. Had hip resection on 08/04/13 with cement placement. Has posterior hip precautions and is PWB right LE.    OT comments  Pt making good progress with OT sessions.  Still needs mod assist with cueing for sit to stand and adherence to hip precautions during performance of selfcare tasks.  Will continue to work toward established [place] with possible transfer to [facility] for continued rehab.  Follow Up Recommendations  LTACH;Supervision/Assistance - 24 hour    Equipment Recommendations  3 in 1 bedside comode;Tub/shower seat       Precautions / Restrictions Precautions Precautions: Posterior Hip;Fall Precaution Comments: pt able to state 3/3 precautions Restrictions Weight Bearing Restrictions: Yes RLE Weight Bearing: Partial weight bearing RLE Partial Weight Bearing Percentage or Pounds: 50       Mobility Bed Mobility Overal bed mobility: Needs Assistance Bed Mobility: Sit to Supine       Sit to supine: Min assist      Transfers   Equipment used: Rolling walker (2 wheeled) Transfers: Sit to/from Stand Sit to Stand: Mod assist;From elevated surface         General transfer comment: Mod instructional cueing for hand placement with sit to stand transiton from the EOB.     Balance   Sitting-balance support: No upper extremity supported Sitting balance-Leahy Scale: Fair Sitting balance - Comments: Pt with increased pain from the rail on the EOB while sitting.  Did tolerate sitting for 25 mins with sit to stand integrated in for relief.   Standing balance support: Bilateral upper  extremity supported Standing balance-Leahy Scale: Poor Standing balance comment: Pt able to stand with bilateral UE support on the walker but unable to peform his own peri hygiene.                   ADL Overall ADL's : Needs assistance/impaired     Grooming: Wash/dry hands;Wash/dry face;Applying deodorant;Sitting;Supervision/safety   Upper Body Bathing: Supervision/ safety;Sitting   Lower Body Bathing: Maximal assistance;Adhering to hip precautions;Sit to/from stand   Upper Body Dressing : Supervision/safety;Sitting (hospital gown only)   Lower Body Dressing: Maximal assistance;With adaptive equipment;Adhering to hip precautions Lower Body Dressing Details (indicate cue type and reason): Pt needing mod instructional cueing for use of AE and maintaining hip precautions.               General ADL Comments: Pt pleasant and cooperative.  He is somewhat familiar with AE as his mother has had it.  Still needs continued practice for efficiency.  Mod assist for sit to stand from the EOB during LB bathing.                Cognition   Behavior During Therapy: WFL for tasks assessed/performed Overall Cognitive Status: Within Functional Limits for tasks assessed                                    Pertinent Vitals/ Pain       Pt with pain in his buttocks and right hip in sitting.  No number rating given but pt repositioned.  Frequency Min 2X/week     Progress Toward Goals  OT Goals(current goals can now be found in the care plan section)  Progress towards OT goals: Progressing toward goals     Plan Discharge plan remains appropriate       End of Session Equipment Utilized During Treatment: Rolling walker   Activity Tolerance Patient limited by pain   Patient Left in bed;with call bell/phone within reach;with family/visitor present   Nurse Communication Mobility status        Time: 2482-5003 OT Time Calculation (min): 47 min  Charges:  OT General Charges $OT Visit: 1 Procedure OT Treatments $Self Care/Home Management : 38-52 mins  [name] [name] OTR/L 08/12/2013, 10:01 AM

## 2013-08-12 NOTE — Progress Notes (Signed)
Internal Medicine Attending  Date: 08/12/2013  Patient name: [name] [name] Medical record number: [id number] Date of birth: 12-May-1960 Age: 53 y.o. Gender: male  I saw and evaluated the patient. I discussed patient and reviewed the resident's note by Dr. Trudee Kuster, and I agree with the resident's findings and plans as documented in his note.  I agree with plan to pursue transfer to [facility] for wound management and completion of IV antibiotics in preparation for further orthopedic and possibly plastic surgery.

## 2013-08-12 NOTE — Discharge Instructions (Signed)
·   Thank you for allowing [place] to be involved in your healthcare while you were hospitalized at [facility].   Please note that there have been changes to your home medications.  --> PLEASE LOOK AT YOUR DISCHARGE MEDICATION LIST FOR DETAILS.  Please call your PCP if you have any questions or concerns, or any difficulty getting any of your medications.  Please return to the ER if you have worsening of your symptoms or new severe symptoms arise.  PARTIAL WEIGHT BEARING RIGHT LOWER EXTREMITY UNTIL FURTHER DIRECTED Posterior hip precautions  Staples out by 08/15/13

## 2013-08-12 NOTE — Progress Notes (Addendum)
Received call just now from Sunday Shams at [place] stating that the medical director there had denied LTACH and approved SNF.  She said this was based on the pt requiring dressing change once daily.  I advised her that pt currently has a rectal tube in and that the order is for dressing changes, wet to dry, twice daily.  The patient will not have a rectal tube in place at SNF and would potentially require 3-4 dressing changes per day if diarrhea persists.  He has 30 more days of IV Rocephin to go as well as a need for acute rehab.  The combination of all of this with limited RNs in the SNF seemed to me to say that LTACH would be a better option for the patient's optimal outcome leading up to his next surgeries (orthopedic and potenially, plastics).  In a patient that was functional and independent prior to this admission, it seemed the right thing to do to approve LTACH, at least initially.  Ms. Virgina Jock saw that there was a new OT note recommending LTACH and the previously discussed info, she was going to speak with the medical director again about the decision and call me back.   I have spoken with LTACH rep, Donnelly Stager, and advised her that I am waiting on a call back from the insurance company with final decision.  I am waiting for that final answer to tell the patient.     ADDENDUM:  1442pm  Received call back from [place] with [facility] that patient has been approved by Market researcher for transfer to [facility].  She was also contacting [employer] to give them the authorization.  I will be going now to speak with patient.  MD has been notified of the potential for discharge for tomorrow.  Will need EMTALA form completed and d/c summary for transfer.

## 2013-08-12 NOTE — Progress Notes (Signed)
LATE ENTRY--Notified by nursing staff yesterday that the family was asking about LTACH as a possibility for d/c and that they were asking to meet with me about this. Arranged family meeting for 1pm.  Pt's 2 sisters were in the room with him to discuss.  They had been told about LTACH by an ICU RN.  They had already researched their options and made the choice of [facility].  I explained the process and how insurance approval figures into this option.  They all stated understanding. Took the sisters over to the [facility] on 5th floor for a tour.  They returned later on to say that they want the patient there if insurance will approve.  Sonia Baller, [employer] hospital rep for [facility], was notified of the referral.  I have also spoken with Dr. Trudee Kuster and explained where we are in the process for this and he was agreeable with this option.  At this time, we are awaiting insurance approval.    Patient was involved with all discussions and indicated that his two sisters can speak for him as needed.  Confirmed that there was no legal spouse or adult children.  Pt's father is deceased and mother living in SNF and unable to make her own decisions.

## 2013-08-12 NOTE — Progress Notes (Signed)
Subjective: [name] [name] reports doing well this morning without pain.  He tolerated a full diet yesterday without issue.  He continues to work with OT and PT and has been slowly building strength.  He has been afebrile since transfer from the ICU and denies chest pain, SOB, nausea, vomiting, or chills.  Interval Events: -Talked to ortho and gen surg who are okay with discharging patient to [facility]. -Consulted plastic surgery who is considering a flap, okay with discharge to [facility]. -Family visited and approved [facility]. -Case manager working on Biochemist, clinical. -Supplementing K PO, 3.5 this morning. -Anemia workup shows elevated ferritin 514.  Objective: Vital signs in last 24 hours: Filed Vitals:   08/11/13 2249 08/12/13 0000 08/12/13 0400 08/12/13 0609  BP: 124/75   145/74  Pulse: 49   51  Temp: 98.3 F (36.8 C)   98.1 F (36.7 C)  TempSrc: Oral   Oral  Resp: 16 20 20 16   Height:      Weight:      SpO2: 99% 98% 98% 99%   Weight change:   Intake/Output Summary (Last 24 hours) at 08/12/13 1029 Last data filed at 08/12/13 0900  Gross per 24 hour  Intake    642 ml  Output   1876 ml  Net  -1234 ml   BP 145/74  Pulse 51  Temp(Src) 98.1 F (36.7 C) (Oral)  Resp 16  Ht 6' 1.5" (1.867 m)  Wt 128 lb 15.5 oz (58.5 kg)  BMI 16.78 kg/m2  SpO2 99%  General Appearance:    Alert, cooperative, no distress, cachetic.  HEENT:    Normocephalic, without obvious abnormality, atraumatic, PERRL, EOMI, MMM  Back:     Symmetric, R lower back decubitus ulcer well bandaged.  Lungs:     Clear to auscultation bilaterally, no respiratory distress.  Chest wall:    No tenderness or deformity.  Heart:    Regular rate and rhythm, S1 and S2 normal, no murmur, rub   or gallop  Abdomen:     Soft, non-tender, bowel sounds active,    no masses, no organomegaly.  Extremities:   R lateral hip and upper leg with several incisions well bandaged. Staples in place.  Bandages clean and dry. No  cyanosis or edema in feet. SCDs in place.  Pulses:   2+ and symmetric radial and DP pulses.   Lab Results: Basic Metabolic Panel:  Recent Labs Lab 08/07/13 0430 08/08/13 0440  08/10/13 1215  08/11/13 0520 08/12/13 0505  NA 137 139  < > 138  < > 138 139  K 3.4* 3.9  < > 2.7*  < > 3.3* 3.5*  CL 106 106  < > 98  < > 101 101  CO2 20 22  < > 29  < > 28 25  GLUCOSE 123* 120*  < > 112*  < > 131* 107*  BUN 14 17  < > 8  < > 11 13  CREATININE 0.89 0.88  < > 0.57  < > 0.67 0.69  CALCIUM 6.8* 7.0*  < > 7.2*  < > 7.3* 7.6*  MG 2.1 1.8  --  1.3*  --   --  1.5  PHOS 3.0 2.7  --   --   --   --   --   < > = values in this interval not displayed. Liver Function Tests: No results found for this basename: AST, ALT, ALKPHOS, BILITOT, PROT, ALBUMIN,  in the last 168 hours No results  found for this basename: LIPASE, AMYLASE,  in the last 168 hours No results found for this basename: AMMONIA,  in the last 168 hours CBC:  Recent Labs Lab 08/11/13 0520 08/12/13 0505  WBC 7.4 8.4  HGB 8.3* 8.1*  HCT 25.1* 24.3*  MCV 98.0 97.6  PLT 172 192   Cardiac Enzymes: No results found for this basename: CKTOTAL, CKMB, CKMBINDEX, TROPONINI,  in the last 168 hours BNP: No results found for this basename: PROBNP,  in the last 168 hours D-Dimer: No results found for this basename: DDIMER,  in the last 168 hours CBG: No results found for this basename: GLUCAP,  in the last 168 hours Hemoglobin A1C: No results found for this basename: HGBA1C,  in the last 168 hours Fasting Lipid Panel: No results found for this basename: CHOL, HDL, LDLCALC, TRIG, CHOLHDL, LDLDIRECT,  in the last 168 hours Thyroid Function Tests: No results found for this basename: TSH, T4TOTAL, FREET4, T3FREE, THYROIDAB,  in the last 168 hours Coagulation: No results found for this basename: LABPROT, INR,  in the last 168 hours Anemia Panel:  Recent Labs Lab 08/11/13 1240  VITAMINB12 647  FERRITIN 514*  TIBC 237  IRON 83    RETICCTPCT 2.4   Urine Drug Screen: Drugs of Abuse  No results found for this basename: labopia,  cocainscrnur,  labbenz,  amphetmu,  thcu,  labbarb    Alcohol Level: No results found for this basename: ETH,  in the last 168 hours Urinalysis: No results found for this basename: COLORURINE, APPERANCEUR, LABSPEC, PHURINE, GLUCOSEU, HGBUR, BILIRUBINUR, KETONESUR, PROTEINUR, UROBILINOGEN, NITRITE, LEUKOCYTESUR,  in the last 168 hours  Micro Results: Recent Results (from the past 240 hour(s))  BODY FLUID CULTURE     Status: None   Collection Time    08/04/13  4:51 PM      Result Value Ref Range Status   Specimen Description SYNOVIAL FLUID RIGHT HIP   Final   Special Requests PT ON VANCO ZOSYN   Final   Gram Stain     Final   Value: MODERATE WBC PRESENT,BOTH PMN AND MONONUCLEAR     RARE GRAM POSITIVE COCCI     IN CHAINS     Performed at [facility]   Culture     Final   Value: MODERATE GROUP B STREP(S.AGALACTIAE)ISOLATED     Performed at [facility]   Report Status 08/07/2013 FINAL   Final   Organism ID, Bacteria GROUP B STREP(S.AGALACTIAE)ISOLATED   Final  BODY FLUID CULTURE     Status: None   Collection Time    08/04/13  4:54 PM      Result Value Ref Range Status   Specimen Description SYNOVIAL FLUID HIP RIGHT   Final   Special Requests FLUID ON SWAB PT ON VANCO ZOSYN   Final   Gram Stain     Final   Value: RARE WBC PRESENT,BOTH PMN AND MONONUCLEAR     NO ORGANISMS SEEN     Performed at [facility]   Culture     Final   Value: RARE GROUP B STREP(S.AGALACTIAE)ISOLATED     Note: SUSCEPTIBILITIES PERFORMED ON PREVIOUS CULTURE WITHIN THE LAST 5 DAYS.     Performed at [facility]   Report Status 08/07/2013 FINAL   Final  ANAEROBIC CULTURE     Status: None   Collection Time    08/04/13  4:54 PM      Result Value Ref Range Status   Specimen  Description SYNOVIAL FLUID HIP RIGHT   Final   Special Requests FLUID ON SWAB PT ON VANCO ZOSYN    Final   Gram Stain     Final   Value: RARE WBC PRESENT,BOTH PMN AND MONONUCLEAR     NO ORGANISMS SEEN     Performed at [facility]   Culture     Final   Value: NO ANAEROBES ISOLATED     Performed at [facility]   Report Status 08/09/2013 FINAL   Final  CLOSTRIDIUM DIFFICILE BY PCR     Status: None   Collection Time    08/05/13  8:02 PM      Result Value Ref Range Status   C difficile by pcr NEGATIVE  NEGATIVE Final   Studies/Results: No results found. Medications: I have reviewed the patient's current medications. Scheduled Meds: . atazanavir  300 mg Oral Q breakfast  . cefTRIAXone (ROCEPHIN)  IV  2 g Intravenous Q24H  . docusate sodium  100 mg Oral BID  . feeding supplement (ENSURE COMPLETE)  237 mL Oral TID BM  . fludrocortisone  0.1 mg Oral Daily  . hydrocortisone sodium succinate  50 mg Intravenous Q6H  . lamiVUDine-zidovudine  1 tablet Oral BID  . midodrine  10 mg Oral TID WC  . nystatin  5 mL Oral QID  . potassium chloride  40 mEq Oral BID  . ritonavir  100 mg Oral Q breakfast  . sodium chloride  10-40 mL Intracatheter Q12H  . sulfamethoxazole-trimethoprim  1 tablet Oral Daily  . tenofovir  300 mg Oral Daily   Continuous Infusions:  PRN Meds:.acetaminophen, acetaminophen, atropine, HYDROcodone-acetaminophen, HYDROmorphone (DILAUDID) injection, loperamide, menthol-cetylpyridinium, ondansetron (ZOFRAN) IV, ondansetron, phenol, polyethylene glycol, sodium chloride  Assessment/Plan: Principal Problem:   Septic arthritis of hip Active Problems:   HIV DISEASE   ANEMIA, CHRONIC   Decubitus ulcer of sacral region, stage 4   Protein-calorie malnutrition, severe   Septic shock(785.52)  Septic Arthritis of Right Hip in setting of stage 4 decubitus ulcer: Patient appears to be recovering well POD7 from temporary prosthetic hip replacement by orthopedics and POD3 sacral ulcer debridement by general surgery.  He has been afebrile since switching to Rocephin  for pan-sensitive group B strep.  Per ID he will need 6 weeks of antibiotics.  PT/OT going well.  Should meet requirements for [facility] on discharge given disability and need for nursing care.  Discussed with Dr. [name] of Ortho and Dr. Maxwell Caul of Gen Surg yesterday who both okayed transfer to [facility].  Dr. Migdalia Dk of plastic surgery saw the patient yesterday and is considering flap, but they are okay with transfer to [facility] at this time. -Appreciate orthopedics, general surgery, ID, wound team, and plastic surgery -Okay for discharge per ortho, gen surg, and plastics -Continue ceftriaxone 2 g iv daily for 6 weeks (Ending on 09/10/13). -Rotate frequently to relieve pressure form ulcer -Acetaminophen 650 mg q6hprn, hydrocodone-acetaminophen 5-325 1-2 tab po q4hprn, hydromorphone 0.5-1 mg iv q3hprn for pain -Regular diet. -Nutrition recommends chocolate Ensure Complete PO TID, RD following.  Macrocytic Anemia: Hgb stable at 8.1. Baseline is 12. Macrocytic anemia. Folate 5.5 and vitamin B12 247 normal on 07/31/13.  TIBC and saturation % normal with elevated ferritin, consistent with anemia of chronic disease with component related to anti-retroviral therapy and recovery from acute sepsis.  Surgical losses were also possible. -Cont to monitor CBC.  HIV: Pt is followed by [name], ID, who was informed of hospitalization. Patient CD4 on admission  is 140 with viral load 251, stable from 4 months ago. CD4 <200 requires PCP prophylaxis. On atazanavir, combivir, norvir, and tenofovir at home. He admits he has missed a few doses recently.  -Continue Atazanavir 300 mg daily  -Continue Lamivudine-Zidovudine 150-300 mg daily  -Continue ritonavir 100 mg daily  -Continue Tenofovir 300 mg daily  -Bactrim 1 double strength tab daily  -Appreciate ID recs  History of Squamous Cell Cancer of the Anus in 2008, s/p radiation and chemotherapy: Follows with Dr. Dalbert Batman of [facility]. CT in February 2015 showed no  recurrence of cancer, infection, adenopathy or hernia. CT showed post-radiation changes. Dr. Dalbert Batman informed of hospitalization. May have contributed to sacral ulcer.  History of DVT: LLE DVT in August of 2008. Complication of radiation and chemotherapy. No lower extremity edema, no calf tenderness. No longer on coumadin at home. -Holding enoxaparin 40 mg [place] due to low platelets -SCDs  Hypokalemia: K up to 3.5 this morning, Mg of 1.5.  Received Kdur 40 mEq x2 yesterday. -Kdur 40 mEq this morning. -Follow BMP and magnesium.  Dispo: Disposition is deferred at this time, awaiting insurance approval.  Anticipated discharge today.   The patient does have a current PCP [name], MD) and does need an [facility] hospital follow-up appointment after discharge.  The patient does not know have transportation limitations that hinder transportation to clinic appointments.  .Services Needed at time of discharge: Y = Yes, Blank = No PT:   OT:   RN:   Equipment:   Other: LTACH    LOS: 13 days   Arman Filter, MD 08/12/2013, 10:29 AM

## 2013-08-13 ENCOUNTER — Inpatient Hospital Stay
Admission: AD | Admit: 2013-08-13 | Discharge: 2013-10-03 | Disposition: A | Discharge status: Skilled Nursing Facility | Payer: Self-pay | Source: Ambulatory Visit | Attending: Internal Medicine | Admitting: Internal Medicine

## 2013-08-13 ENCOUNTER — Encounter (HOSPITAL_COMMUNITY): Payer: Self-pay | Admitting: Plastic Surgery

## 2013-08-13 DIAGNOSIS — L89154 Pressure ulcer of sacral region, stage 4: Secondary | ICD-10-CM

## 2013-08-13 DIAGNOSIS — E46 Unspecified protein-calorie malnutrition: Secondary | ICD-10-CM

## 2013-08-13 LAB — BASIC METABOLIC PANEL
ANION GAP: 12 (ref 5–15)
BUN: 15 mg/dL (ref 6–23)
CHLORIDE: 106 meq/L (ref 96–112)
CO2: 26 meq/L (ref 19–32)
CREATININE: 0.63 mg/dL (ref 0.50–1.35)
Calcium: 7.5 mg/dL — ABNORMAL LOW (ref 8.4–10.5)
GFR calc Af Amer: >90 mL/min (ref >90)
GFR calc non Af Amer: >90 mL/min (ref >90)
Glucose, Bld: 113 mg/dL — ABNORMAL HIGH (ref 70–99)
POTASSIUM: 3.9 meq/L (ref 3.7–5.3)
Sodium: 144 mEq/L (ref 137–147)

## 2013-08-13 LAB — MAGNESIUM: Magnesium: 1.6 mg/dL (ref 1.5–2.5)

## 2013-08-13 LAB — PREALBUMIN: PREALBUMIN: 20 mg/dL (ref 17.0–34.0)

## 2013-08-13 LAB — CLOSTRIDIUM DIFFICILE BY PCR: Toxigenic C. Difficile by PCR: NEGATIVE

## 2013-08-13 MED ORDER — SODIUM CHLORIDE 0.9 % IJ SOLN: 10.0000 mL | INTRAMUSCULAR | Status: DC | PRN

## 2013-08-13 MED ORDER — DSS 100 MG PO CAPS: 100.0000 mg | ORAL_CAPSULE | Freq: Two times a day (BID) | ORAL | Status: DC

## 2013-08-13 MED ORDER — ENSURE COMPLETE PO LIQD: 237.0000 mL | Freq: Three times a day (TID) | ORAL | Status: DC

## 2013-08-13 MED ORDER — SODIUM CHLORIDE 0.9 % IJ SOLN: 10.0000 mL | Freq: Two times a day (BID) | INTRAMUSCULAR | Status: DC

## 2013-08-13 MED ORDER — SULFAMETHOXAZOLE-TMP DS 800-160 MG PO TABS: 1.0000 | ORAL_TABLET | Freq: Every day | ORAL | Status: DC

## 2013-08-13 MED ORDER — HYDROCODONE-ACETAMINOPHEN 5-325 MG PO TABS: 1.0000 | ORAL_TABLET | Freq: Four times a day (QID) | ORAL | Status: DC | PRN

## 2013-08-13 MED ORDER — HYDROCORTISONE NA SUCCINATE PF 100 MG IJ SOLR: 25.0000 mg | Freq: Four times a day (QID) | INTRAMUSCULAR | Status: DC

## 2013-08-13 MED ORDER — CEFTRIAXONE SODIUM 2 G IJ SOLR: 2.0000 g | INTRAMUSCULAR | Status: DC

## 2013-08-13 MED ORDER — POTASSIUM CHLORIDE CRYS ER 20 MEQ PO TBCR: 40.0000 meq | EXTENDED_RELEASE_TABLET | Freq: Every day | ORAL | Status: DC

## 2013-08-13 MED ORDER — MIDODRINE HCL 10 MG PO TABS: 10.0000 mg | ORAL_TABLET | Freq: Three times a day (TID) | ORAL | Status: DC

## 2013-08-13 NOTE — Consult Note (Signed)
Reason for Consult: Sacral ulcer Referring Physician: Primary team and ID  [name] [name] is an 53 y.o. male.  HPI: The patient is a 53 yrs old wm here for treatment of infection in hip and pelvic area.  He has HIV and multiple medical conditions.  The sacral area is exposed with bone.  There is very little surrounding soft tissue. The area is ~ 6 x 8 cm.  He is spending most of his time on his sacral area as he can't rest on his right hip.  He is ambulatory other than his hip.   Past Medical History  Diagnosis Date  . Radiation   . HIV (human immunodeficiency virus infection)   . Shingles     left shoulder, right leg  . Pain in limb 07/23/2013  . Rectal cancer     Squamous cell  . Gout     Past Surgical History  Procedure Laterality Date  . Hernia repair      right  . Hematoma evacuation    . Incision and drainage hip Right 07/31/2013    Procedure: IRRIGATION AND DEBRIDEMENT HIP WITH PERCUTANEOUS DRAIN PLACEMENT;  Surgeon: [name], MD;  Location: [place];  Service: Orthopedics;  Laterality: Right;  . Total hip arthroplasty Right 08/04/2013    Procedure: RESECTION HIP JOINT - PLACEMENT OF CEMENT PROSTHESIS;  Surgeon: [name], MD;  Location: [place];  Service: Orthopedics;  Laterality: Right;  . Pilonidal cyst drainage N/A 08/09/2013    Procedure: IRRIGATION AND DEBRIDEMENT SACRAL DECUBITUS;  Surgeon: Harl Bowie, MD;  Location: [place];  Service: General;  Laterality: N/A;    Family History  Problem Relation Age of Onset  . Diabetes Mother   . Dementia Mother   . Atrial fibrillation Mother   . Hypertension Mother   . Cancer Father   . Emphysema Father     Social History:  reports that he has been smoking Cigarettes.  He has been smoking about 0.30 packs per day. He has never used smokeless tobacco. He reports that he drinks about 1.5 ounces of alcohol per week. He reports that he uses illicit drugs (Marijuana).  Allergies: No Known Allergies  Medications: I  have reviewed the patient's current medications.  Results for orders placed during the hospital encounter of 07/30/13 (from the past 48 hour(s))  VITAMIN B12     Status: None   Collection Time    08/11/13 12:40 PM      Result Value Ref Range   Vitamin B-12 647  211 - 911 pg/mL   Comment: Performed at [facility]     Status: None   Collection Time    08/11/13 12:40 PM      Result Value Ref Range   Iron 83  42 - 135 ug/dL   TIBC 237  215 - 435 ug/dL   Saturation Ratios 35  20 - 55 %   UIBC 154  125 - 400 ug/dL   Comment: Performed at [facility]     Status: Abnormal   Collection Time    08/11/13 12:40 PM      Result Value Ref Range   Ferritin 514 (*) 22 - 322 ng/mL   Comment: Performed at [facility]     Status: Abnormal   Collection Time    08/11/13 12:40 PM      Result Value Ref Range   Retic Ct Pct 2.4  0.4 - 3.1 %   RBC. 2.59 (*) 4.22 - 5.81 MIL/uL   Retic Count, Manual 62.2  19.0 - 186.0 K/uL  BASIC METABOLIC PANEL     Status: Abnormal   Collection Time    08/12/13  5:05 AM      Result Value Ref Range   Sodium 139  137 - 147 mEq/L   Potassium 3.5 (*) 3.7 - 5.3 mEq/L   Chloride 101  96 - 112 mEq/L   CO2 25  19 - 32 mEq/L   Glucose, Bld 107 (*) 70 - 99 mg/dL   BUN 13  6 - 23 mg/dL   Creatinine, Ser 0.69  0.50 - 1.35 mg/dL   Calcium 7.6 (*) 8.4 - 10.5 mg/dL   GFR calc non Af Amer >90  >90 mL/min   GFR calc Af Amer >90  >90 mL/min   Comment: (NOTE)     The eGFR has been calculated using the CKD EPI equation.     This calculation has not been validated in all clinical situations.     eGFR's persistently <90 mL/min signify possible Chronic Kidney     Disease.   Anion gap 13  5 - 15  CBC     Status: Abnormal   Collection Time    08/12/13  5:05 AM      Result Value Ref Range   WBC 8.4  4.0 - 10.5 K/uL   RBC 2.49 (*) 4.22 - 5.81 MIL/uL   Hemoglobin 8.1 (*) 13.0 - 17.0 g/dL   HCT 24.3 (*) 39.0 - 52.0  %   MCV 97.6  78.0 - 100.0 fL   MCH 32.5  26.0 - 34.0 pg   MCHC 33.3  30.0 - 36.0 g/dL   RDW 17.1 (*) 11.5 - 15.5 %   Platelets 192  150 - 400 K/uL  MAGNESIUM     Status: None   Collection Time    08/12/13  5:05 AM      Result Value Ref Range   Magnesium 1.5  1.5 - 2.5 mg/dL  CLOSTRIDIUM DIFFICILE BY PCR     Status: None   Collection Time    08/12/13  6:40 PM      Result Value Ref Range   C difficile by pcr NEGATIVE  NEGATIVE  BASIC METABOLIC PANEL     Status: Abnormal   Collection Time    08/13/13  4:45 AM      Result Value Ref Range   Sodium 144  137 - 147 mEq/L   Potassium 3.9  3.7 - 5.3 mEq/L   Chloride 106  96 - 112 mEq/L   CO2 26  19 - 32 mEq/L   Glucose, Bld 113 (*) 70 - 99 mg/dL   BUN 15  6 - 23 mg/dL   Creatinine, Ser 0.63  0.50 - 1.35 mg/dL   Calcium 7.5 (*) 8.4 - 10.5 mg/dL   GFR calc non Af Amer >90  >90 mL/min   GFR calc Af Amer >90  >90 mL/min   Comment: (NOTE)     The eGFR has been calculated using the CKD EPI equation.     This calculation has not been validated in all clinical situations.     eGFR's persistently <90 mL/min signify possible Chronic Kidney     Disease.   Anion gap 12  5 - 15  MAGNESIUM     Status: None   Collection Time    08/13/13  4:45 AM  Result Value Ref Range   Magnesium 1.6  1.5 - 2.5 mg/dL    No results found.  Review of Systems  Constitutional: Positive for fever and weight loss.  HENT: Negative.   Eyes: Negative.   Respiratory: Negative.   Cardiovascular: Negative.   Gastrointestinal: Negative.   Genitourinary: Negative.   Musculoskeletal: Positive for myalgias.  Skin: Negative.    Blood pressure 128/80, pulse 56, temperature 97.9 F (36.6 C), temperature source Oral, resp. rate 16, height 6' 1.5" (1.867 m), weight 58.5 kg (128 lb 15.5 oz), SpO2 100.00%. Physical Exam  Constitutional: He is oriented to person, place, and time.  HENT:  Head: Normocephalic.  Eyes: Conjunctivae are normal. Pupils are equal, round,  and reactive to light.  Cardiovascular: Normal rate.   Respiratory: Effort normal.  Musculoskeletal:       Back:  Neurological: He is alert and oriented to person, place, and time.  Psychiatric: He has a normal mood and affect.    Assessment/Plan: His positioning is very concerning for any kind of a flap as he would need to be off the sacral area for a minimum of 6 weeks. He also has a low albumin and we will check his prealbumin as well.  With a low prealbumin there will be significant complication if surgery is done for a flap.  He will have little chance of healing the flap and will create a much larger wound.  We have asked him to start positioning himself off the area to see if he will be able to tolerate it.  SANGER,CLAIRE 08/13/2013, 8:57 AM

## 2013-08-13 NOTE — Progress Notes (Signed)
Internal Medicine Attending  Date: 08/13/2013  Patient name: [name] [name] Medical record number: [id number] Date of birth: 10-25-1960 Age: 53 y.o. Gender: male  I saw and evaluated the patient, and discussed his care with resident on A.M rounds.  I reviewed the resident's note by Dr. Trudee Kuster and I agree with the resident's findings and plans as documented in his note.

## 2013-08-13 NOTE — Progress Notes (Signed)
Report was given to [facility] at [facility].

## 2013-08-13 NOTE — Progress Notes (Signed)
Physical Therapy Treatment Patient Details Name: [name] [name] MRN: [id number] DOB: September 11, 1960 Today's Date: 08/13/2013    History of Present Illness 53 year old, HIV positive admitted 7/22 with septic arthritis of right hip and abscess extending into the pelvis, underwent I&D and drainage on 7/23 followed by septic shock, hypotensive in spite of full resuscitation. Hence PCCM consulted 7/24. Had hip resection on 08/04/13 with cement placement. Has posterior hip precautions and is PWB right LE.     PT Comments    Progressing well.  Still very weak and deconditioned.  Has frequent stools which contaminate his sacral wound.  Follow Up Recommendations  LTACH     Equipment Recommendations  Other (comment)    Recommendations for Other Services       Precautions / Restrictions Precautions Precautions: Posterior Hip;Fall Precaution Comments: pt able to state 3/3 precautions Restrictions RLE Weight Bearing: Partial weight bearing RLE Partial Weight Bearing Percentage or Pounds: 50    Mobility  Bed Mobility Overal bed mobility: Needs Assistance Bed Mobility: Supine to Sit;Sit to Supine Rolling: Min guard     Sit to supine: Min assist   General bed mobility comments: reinforcing technique, pt tends to lean over to come up via L elbow.  Still needs assist to get R LE into bed.  Transfers Overall transfer level: Needs assistance Equipment used: Rolling walker (2 wheeled) Transfers: Sit to/from Stand Sit to Stand: Min assist         General transfer comment: cues for hand placement and steady assist  Ambulation/Gait Ambulation/Gait assistance: Min guard Ambulation Distance (Feet): 95 Feet Assistive device: Rolling walker (2 wheeled) Gait Pattern/deviations: Step-to pattern;Step-through pattern Gait velocity: slow, but less guarded   General Gait Details: reinforced sequencing and how it relates to his hip precautions   Stairs            Wheelchair Mobility     Modified Rankin (Stroke Patients Only)       Balance Overall balance assessment: No apparent balance deficits (not formally assessed)   Sitting balance-Leahy Scale: Fair Sitting balance - Comments: Pt with increased pain from the rail on the EOB while sitting.  Did tolerate sitting for 25 mins with sit to stand integrated in for relief.     Standing balance-Leahy Scale: Fair Standing balance comment: standing pericare x 2 for loose stools                    Cognition Arousal/Alertness: Awake/alert Behavior During Therapy: WFL for tasks assessed/performed Overall Cognitive Status: Within Functional Limits for tasks assessed                      Exercises      General Comments        Pertinent Vitals/Pain Hip incisional pain manageable.    Home Living                      Prior Function            PT Goals (current goals can now be found in the care plan section) Acute Rehab PT Goals Patient Stated Goal: to go home PT Goal Formulation: With patient Time For Goal Achievement: 08/19/13 Potential to Achieve Goals: Good Progress towards PT goals: Progressing toward goals    Frequency  Min 3X/week    PT Plan Current plan remains appropriate    Co-evaluation             End of Session  Activity Tolerance: Patient tolerated treatment well Patient left: in bed;with call bell/phone within reach     Time: 1530-1556 PT Time Calculation (min): 26 min  Charges:  $Gait Training: 8-22 mins $Therapeutic Activity: 8-22 mins                    G Codes:      Rockey Guarino, Tessie Fass 08/13/2013, 4:05 PM 08/13/2013  Donnella Sham, PT [phone]  (pager)

## 2013-08-13 NOTE — Progress Notes (Signed)
Subjective: [name] reports doing well this morning.  He has 6/10 pain that is controlled with his pain medications.  He continues to work with OT and PT.  He denies chest pain, SOB, nausea, vomiting, or chills.  Interval Events: -Insurance approved LTACH yesterday, bed ready. -Cdiff PCR negative. -Afebrile and VS stable.  Objective: Vital signs in last 24 hours: Filed Vitals:   08/12/13 1352 08/12/13 2000 08/12/13 2135 08/13/13 0535  BP: 94/57  118/67 128/80  Pulse: 58  53 56  Temp: 97.5 F (36.4 C)  98.2 F (36.8 C) 97.9 F (36.6 C)  TempSrc: Oral  Oral   Resp: 17 16 17 16   Height:      Weight:      SpO2: 100% 99% 100% 100%   Weight change:   Intake/Output Summary (Last 24 hours) at 08/13/13 0808 Last data filed at 08/13/13 9833  Gross per 24 hour  Intake    674 ml  Output   1451 ml  Net   -777 ml   BP 128/80  Pulse 56  Temp(Src) 97.9 F (36.6 C) (Oral)  Resp 16  Ht 6' 1.5" (1.867 m)  Wt 128 lb 15.5 oz (58.5 kg)  BMI 16.78 kg/m2  SpO2 100%  General Appearance:    Alert, cooperative, no distress, cachetic, feces beneath patient on diaper.  HEENT:    Normocephalic, without obvious abnormality, atraumatic, PERRL, EOMI, MMM  Back:     Symmetric, R lower back decubitus ulcer well bandaged.  Lungs:     Clear to auscultation bilaterally, no respiratory distress.  Chest wall:    No tenderness or deformity.  Heart:    Regular rate and rhythm, S1 and S2 normal, no murmur, rub   or gallop  Abdomen:     Soft, non-tender, bowel sounds active,    no masses, no organomegaly.  Extremities:   R lateral hip and upper leg with several incisions well bandaged. Staples in place.  Bandages clean and dry. No cyanosis or edema in feet. SCDs in place.  Pulses:   2+ and symmetric radial and DP pulses.   Lab Results: Basic Metabolic Panel:  Recent Labs Lab 08/07/13 0430 08/08/13 0440  08/12/13 0505 08/13/13 0445  NA 137 139  < > 139 144  K 3.4* 3.9  < > 3.5* 3.9  CL  106 106  < > 101 106  CO2 20 22  < > 25 26  GLUCOSE 123* 120*  < > 107* 113*  BUN 14 17  < > 13 15  CREATININE 0.89 0.88  < > 0.69 0.63  CALCIUM 6.8* 7.0*  < > 7.6* 7.5*  MG 2.1 1.8  < > 1.5 1.6  PHOS 3.0 2.7  --   --   --   < > = values in this interval not displayed. Liver Function Tests: No results found for this basename: AST, ALT, ALKPHOS, BILITOT, PROT, ALBUMIN,  in the last 168 hours No results found for this basename: LIPASE, AMYLASE,  in the last 168 hours No results found for this basename: AMMONIA,  in the last 168 hours CBC:  Recent Labs Lab 08/11/13 0520 08/12/13 0505  WBC 7.4 8.4  HGB 8.3* 8.1*  HCT 25.1* 24.3*  MCV 98.0 97.6  PLT 172 192   Cardiac Enzymes: No results found for this basename: CKTOTAL, CKMB, CKMBINDEX, TROPONINI,  in the last 168 hours BNP: No results found for this basename: PROBNP,  in the last 168 hours D-Dimer: No  results found for this basename: DDIMER,  in the last 168 hours CBG: No results found for this basename: GLUCAP,  in the last 168 hours Hemoglobin A1C: No results found for this basename: HGBA1C,  in the last 168 hours Fasting Lipid Panel: No results found for this basename: CHOL, HDL, LDLCALC, TRIG, CHOLHDL, LDLDIRECT,  in the last 168 hours Thyroid Function Tests: No results found for this basename: TSH, T4TOTAL, FREET4, T3FREE, THYROIDAB,  in the last 168 hours Coagulation: No results found for this basename: LABPROT, INR,  in the last 168 hours Anemia Panel:  Recent Labs Lab 08/11/13 1240  VITAMINB12 647  FERRITIN 514*  TIBC 237  IRON 83  RETICCTPCT 2.4   Urine Drug Screen: Drugs of Abuse  No results found for this basename: labopia,  cocainscrnur,  labbenz,  amphetmu,  thcu,  labbarb    Alcohol Level: No results found for this basename: ETH,  in the last 168 hours Urinalysis: No results found for this basename: COLORURINE, APPERANCEUR, LABSPEC, PHURINE, GLUCOSEU, HGBUR, BILIRUBINUR, KETONESUR, PROTEINUR,  UROBILINOGEN, NITRITE, LEUKOCYTESUR,  in the last 168 hours  Micro Results: Recent Results (from the past 240 hour(s))  BODY FLUID CULTURE     Status: None   Collection Time    08/04/13  4:51 PM      Result Value Ref Range Status   Specimen Description SYNOVIAL FLUID RIGHT HIP   Final   Special Requests PT ON VANCO ZOSYN   Final   Gram Stain     Final   Value: MODERATE WBC PRESENT,BOTH PMN AND MONONUCLEAR     RARE GRAM POSITIVE COCCI     IN CHAINS     Performed at [facility]   Culture     Final   Value: MODERATE GROUP B STREP(S.AGALACTIAE)ISOLATED     Performed at [facility]   Report Status 08/07/2013 FINAL   Final   Organism ID, Bacteria GROUP B STREP(S.AGALACTIAE)ISOLATED   Final  BODY FLUID CULTURE     Status: None   Collection Time    08/04/13  4:54 PM      Result Value Ref Range Status   Specimen Description SYNOVIAL FLUID HIP RIGHT   Final   Special Requests FLUID ON SWAB PT ON VANCO ZOSYN   Final   Gram Stain     Final   Value: RARE WBC PRESENT,BOTH PMN AND MONONUCLEAR     NO ORGANISMS SEEN     Performed at [facility]   Culture     Final   Value: RARE GROUP B STREP(S.AGALACTIAE)ISOLATED     Note: SUSCEPTIBILITIES PERFORMED ON PREVIOUS CULTURE WITHIN THE LAST 5 DAYS.     Performed at [facility]   Report Status 08/07/2013 FINAL   Final  ANAEROBIC CULTURE     Status: None   Collection Time    08/04/13  4:54 PM      Result Value Ref Range Status   Specimen Description SYNOVIAL FLUID HIP RIGHT   Final   Special Requests FLUID ON SWAB PT ON VANCO ZOSYN   Final   Gram Stain     Final   Value: RARE WBC PRESENT,BOTH PMN AND MONONUCLEAR     NO ORGANISMS SEEN     Performed at [facility]   Culture     Final   Value: NO ANAEROBES ISOLATED     Performed at [facility]   Report Status 08/09/2013 FINAL   Final  CLOSTRIDIUM DIFFICILE  BY PCR     Status: None   Collection Time    08/05/13  8:02 PM      Result Value Ref  Range Status   C difficile by pcr NEGATIVE  NEGATIVE Final  CLOSTRIDIUM DIFFICILE BY PCR     Status: None   Collection Time    08/12/13  6:40 PM      Result Value Ref Range Status   C difficile by pcr NEGATIVE  NEGATIVE Final   Studies/Results: No results found. Medications: I have reviewed the patient's current medications. Scheduled Meds: . atazanavir  300 mg Oral Q breakfast  . cefTRIAXone (ROCEPHIN)  IV  2 g Intravenous Q24H  . docusate sodium  100 mg Oral BID  . feeding supplement (ENSURE COMPLETE)  237 mL Oral TID BM  . fludrocortisone  0.1 mg Oral Daily  . hydrocortisone sodium succinate  50 mg Intravenous Q6H  . lamiVUDine-zidovudine  1 tablet Oral BID  . midodrine  10 mg Oral TID WC  . nystatin  5 mL Oral QID  . potassium chloride  40 mEq Oral BID  . ritonavir  100 mg Oral Q breakfast  . sodium chloride  10-40 mL Intracatheter Q12H  . sulfamethoxazole-trimethoprim  1 tablet Oral Daily  . tenofovir  300 mg Oral Daily   Continuous Infusions:  PRN Meds:.acetaminophen, acetaminophen, atropine, HYDROcodone-acetaminophen, HYDROmorphone (DILAUDID) injection, loperamide, menthol-cetylpyridinium, ondansetron (ZOFRAN) IV, ondansetron, phenol, polyethylene glycol, sodium chloride  Assessment/Plan: Principal Problem:   Septic arthritis of hip Active Problems:   HIV DISEASE   ANEMIA, CHRONIC   Decubitus ulcer of sacral region, stage 4   Protein-calorie malnutrition, severe   Septic shock(785.52)  Septic Arthritis of Right Hip in setting of stage 4 decubitus ulcer: Patient appears to be recovering well POD8 from temporary prosthetic hip replacement by orthopedics and POD2 sacral ulcer debridement by general surgery.  He has been afebrile since switching to Rocephin for pan-sensitive group B strep.  Per ID he will need 6 weeks of antibiotics.  PT/OT going well.  Discussed with Dr. [name] of Ortho and Dr. Maxwell Caul of Gen Surg yesterday who both okayed transfer to [facility].  Dr. Migdalia Dk of  plastic surgery saw the patient and is considering flap, but she is okay with transfer to [facility] at this time.  Approved by insurance for [facility] and assigned a bed yesterday.  He had been having more frequent bowel movements, but Cdiff is negative.  Still on steroids after transfer from ICU. Will begin taper today and continue at [facility].  Spoke to pharmacy about recommendations for taper. -Transfer to [place] [facility], general surgery, ID, wound team, and plastic surgery -Okay for discharge per ortho, gen surg, and plastics -Continue ceftriaxone 2 g iv daily for 6 weeks (Ending on 09/10/13). -Rotate frequently to relieve pressure form ulcer -Discontinue fludrocortisone on discharge -Reduce hydrocortisone to 25 mg q6h for 3 days, then 25 mg BID for 3 days then stop, per pharmacy recs. -Continue midodrine 10 mg PO TID on discharge, wean at [facility]. -Acetaminophen 650 mg q6hprn, hydrocodone-acetaminophen 5-325 1-2 tab po q4hprn, hydromorphone 0.5-1 mg iv q3hprn for pain -Regular diet. -Nutrition recommends chocolate Ensure Complete PO TID, RD following.  Macrocytic Anemia: Hgb stable at 8.1 yesterday. Baseline is 12. Macrocytic anemia. Folate 5.5 and vitamin B12 247 normal on 07/31/13.  TIBC and saturation % normal with elevated ferritin, consistent with anemia of chronic disease with component related to anti-retroviral therapy and recovery from acute sepsis.  Surgical losses were also  possible. -Cont to monitor CBC.  HIV: Pt is followed by [name], ID, who was informed of hospitalization. Patient CD4 on admission is 140 with viral load 251, stable from 4 months ago. CD4 <200 requires PCP prophylaxis. On atazanavir, combivir, norvir, and tenofovir at home. He admits he has missed a few doses recently.  Viral load ordered by ID pending. -Continue Atazanavir 300 mg daily  -Continue Lamivudine-Zidovudine 150-300 mg daily  -Continue ritonavir 100 mg daily  -Continue Tenofovir 300 mg daily    -Bactrim 1 double strength tab daily  -Follow up viral load. -Appreciate ID recs  History of Squamous Cell Cancer of the Anus in 2008, s/p radiation and chemotherapy: Follows with Dr. Dalbert Batman of [facility]. CT in February 2015 showed no recurrence of cancer, infection, adenopathy or hernia. CT showed post-radiation changes. Dr. Dalbert Batman informed of hospitalization. May have contributed to sacral ulcer.  History of DVT: LLE DVT in August of 2008. Complication of radiation and chemotherapy. No lower extremity edema, no calf tenderness. No longer on coumadin at home. -Holding enoxaparin 40 mg [place] due to low platelets -SCDs  Hypokalemia: K up to 3.9 this morning, Mg of 1.6.  Received Kdur 40 mEq x2 yesterday. -Follow BMP and magnesium.  Dispo: Discharge to [facility] today.  The patient does have a current PCP [name], MD) and does need an [facility] hospital follow-up appointment after discharge.  The patient does not know have transportation limitations that hinder transportation to clinic appointments.  .Services Needed at time of discharge: Y = Yes, Blank = No PT:   OT:   RN:   Equipment:   Other: LTACH    LOS: 14 days   Arman Filter, MD 08/13/2013, 8:08 AM

## 2013-08-14 ENCOUNTER — Other Ambulatory Visit (HOSPITAL_COMMUNITY): Payer: Self-pay

## 2013-08-14 LAB — CBC WITH DIFFERENTIAL/PLATELET
BASOS ABS: 0 10*3/uL (ref 0.0–0.1)
BASOS PCT: 0 % (ref 0–1)
EOS ABS: 0 10*3/uL (ref 0.0–0.7)
Eosinophils Relative: 0 % (ref 0–5)
HEMATOCRIT: 23.8 % — AB (ref 39.0–52.0)
Hemoglobin: 7.8 g/dL — ABNORMAL LOW (ref 13.0–17.0)
Lymphocytes Relative: 14 % (ref 12–46)
Lymphs Abs: 1 10*3/uL (ref 0.7–4.0)
MCH: 33.3 pg (ref 26.0–34.0)
MCHC: 32.8 g/dL (ref 30.0–36.0)
MCV: 101.7 fL — ABNORMAL HIGH (ref 78.0–100.0)
MONO ABS: 0.3 10*3/uL (ref 0.1–1.0)
MONOS PCT: 4 % (ref 3–12)
Neutro Abs: 5.7 10*3/uL (ref 1.7–7.7)
Neutrophils Relative %: 82 % — ABNORMAL HIGH (ref 43–77)
Platelets: 163 10*3/uL (ref 150–400)
RBC: 2.34 MIL/uL — ABNORMAL LOW (ref 4.22–5.81)
RDW: 19.3 % — ABNORMAL HIGH (ref 11.5–15.5)
WBC: 6.9 10*3/uL (ref 4.0–10.5)

## 2013-08-14 LAB — COMPREHENSIVE METABOLIC PANEL
ALK PHOS: 73 U/L (ref 39–117)
ALT: 18 U/L (ref 0–53)
AST: 15 U/L (ref 0–37)
Albumin: 2.2 g/dL — ABNORMAL LOW (ref 3.5–5.2)
Anion gap: 10 (ref 5–15)
BUN: 16 mg/dL (ref 6–23)
CALCIUM: 8.1 mg/dL — AB (ref 8.4–10.5)
CHLORIDE: 107 meq/L (ref 96–112)
CO2: 26 mEq/L (ref 19–32)
CREATININE: 0.65 mg/dL (ref 0.50–1.35)
Glucose, Bld: 98 mg/dL (ref 70–99)
Potassium: 4.4 mEq/L (ref 3.7–5.3)
Sodium: 143 mEq/L (ref 137–147)
TOTAL PROTEIN: 5.9 g/dL — AB (ref 6.0–8.3)
Total Bilirubin: 1.3 mg/dL — ABNORMAL HIGH (ref 0.3–1.2)

## 2013-08-14 LAB — TSH: TSH: 3.62 u[IU]/mL (ref 0.350–4.500)

## 2013-08-14 LAB — PROTIME-INR
INR: 1 (ref 0.00–1.49)
Prothrombin Time: 13.2 seconds (ref 11.6–15.2)

## 2013-08-14 LAB — PROCALCITONIN: Procalcitonin: <0.1 ng/mL

## 2013-08-14 LAB — FERRITIN: FERRITIN: 392 ng/mL — AB (ref 22–322)

## 2013-08-14 LAB — HEMOGLOBIN A1C
HEMOGLOBIN A1C: 5.9 % — AB (ref <5.7)
MEAN PLASMA GLUCOSE: 123 mg/dL — AB (ref <117)

## 2013-08-14 LAB — PHOSPHORUS: Phosphorus: 2.4 mg/dL (ref 2.3–4.6)

## 2013-08-14 LAB — T4, FREE: Free T4: 0.84 ng/dL (ref 0.80–1.80)

## 2013-08-14 LAB — VITAMIN B12: VITAMIN B 12: 427 pg/mL (ref 211–911)

## 2013-08-14 LAB — MAGNESIUM: Magnesium: 1.7 mg/dL (ref 1.5–2.5)

## 2013-08-14 LAB — PREALBUMIN: Prealbumin: 20.2 mg/dL (ref 17.0–34.0)

## 2013-08-15 LAB — BASIC METABOLIC PANEL
Anion gap: 10 (ref 5–15)
BUN: 18 mg/dL (ref 6–23)
CO2: 25 mEq/L (ref 19–32)
Calcium: 8.5 mg/dL (ref 8.4–10.5)
Chloride: 108 mEq/L (ref 96–112)
Creatinine, Ser: 0.64 mg/dL (ref 0.50–1.35)
GFR calc Af Amer: >90 mL/min (ref >90)
Glucose, Bld: 85 mg/dL (ref 70–99)
POTASSIUM: 4.7 meq/L (ref 3.7–5.3)
Sodium: 143 mEq/L (ref 137–147)

## 2013-08-15 LAB — CBC WITH DIFFERENTIAL/PLATELET
Basophils Absolute: 0 10*3/uL (ref 0.0–0.1)
Basophils Relative: 0 % (ref 0–1)
EOS ABS: 0 10*3/uL (ref 0.0–0.7)
EOS PCT: 0 % (ref 0–5)
HCT: 27.3 % — ABNORMAL LOW (ref 39.0–52.0)
HEMOGLOBIN: 8.9 g/dL — AB (ref 13.0–17.0)
Lymphocytes Relative: 12 % (ref 12–46)
Lymphs Abs: 0.9 10*3/uL (ref 0.7–4.0)
MCH: 34.1 pg — AB (ref 26.0–34.0)
MCHC: 32.6 g/dL (ref 30.0–36.0)
MCV: 104.6 fL — AB (ref 78.0–100.0)
MONOS PCT: 5 % (ref 3–12)
Monocytes Absolute: 0.4 10*3/uL (ref 0.1–1.0)
NEUTROS PCT: 83 % — AB (ref 43–77)
Neutro Abs: 6 10*3/uL (ref 1.7–7.7)
Platelets: 180 10*3/uL (ref 150–400)
RBC: 2.61 MIL/uL — AB (ref 4.22–5.81)
RDW: 20.5 % — ABNORMAL HIGH (ref 11.5–15.5)
WBC: 7.2 10*3/uL (ref 4.0–10.5)

## 2013-08-15 LAB — CLOSTRIDIUM DIFFICILE BY PCR: Toxigenic C. Difficile by PCR: NEGATIVE

## 2013-08-15 LAB — T-HELPER CELLS (CD4) COUNT (NOT AT ARMC)
CD4 % Helper T Cell: 20 % — ABNORMAL LOW (ref 33–55)
CD4 T Cell Abs: 220 /uL — ABNORMAL LOW (ref 400–2700)

## 2013-08-15 LAB — C-REACTIVE PROTEIN: CRP: <0.5 mg/dL — ABNORMAL LOW (ref <0.60)

## 2013-08-15 LAB — SEDIMENTATION RATE: SED RATE: 43 mm/h — AB (ref 0–16)

## 2013-08-15 LAB — FOLATE RBC: RBC Folate: 928 ng/mL — ABNORMAL HIGH (ref >280)

## 2013-08-15 LAB — HIV-1 RNA QUANT-NO REFLEX-BLD
HIV 1 RNA Quant: 377 copies/mL — ABNORMAL HIGH (ref <20)
HIV-1 RNA QUANT, LOG: 2.58 {Log} — AB (ref <1.30)

## 2013-08-16 LAB — PHOSPHORUS: Phosphorus: 1.9 mg/dL — ABNORMAL LOW (ref 2.3–4.6)

## 2013-08-16 LAB — CBC WITH DIFFERENTIAL/PLATELET
BASOS PCT: 0 % (ref 0–1)
Basophils Absolute: 0 10*3/uL (ref 0.0–0.1)
Eosinophils Absolute: 0 10*3/uL (ref 0.0–0.7)
Eosinophils Relative: 0 % (ref 0–5)
HCT: 23.3 % — ABNORMAL LOW (ref 39.0–52.0)
HEMOGLOBIN: 7.6 g/dL — AB (ref 13.0–17.0)
Lymphocytes Relative: 12 % (ref 12–46)
Lymphs Abs: 0.9 10*3/uL (ref 0.7–4.0)
MCH: 34.5 pg — ABNORMAL HIGH (ref 26.0–34.0)
MCHC: 32.6 g/dL (ref 30.0–36.0)
MCV: 105.9 fL — ABNORMAL HIGH (ref 78.0–100.0)
Monocytes Absolute: 0.5 10*3/uL (ref 0.1–1.0)
Monocytes Relative: 6 % (ref 3–12)
NEUTROS PCT: 82 % — AB (ref 43–77)
Neutro Abs: 6.1 10*3/uL (ref 1.7–7.7)
PLATELETS: 143 10*3/uL — AB (ref 150–400)
RBC: 2.2 MIL/uL — ABNORMAL LOW (ref 4.22–5.81)
RDW: 21.5 % — ABNORMAL HIGH (ref 11.5–15.5)
WBC: 7.5 10*3/uL (ref 4.0–10.5)

## 2013-08-16 LAB — BASIC METABOLIC PANEL
ANION GAP: 10 (ref 5–15)
BUN: 22 mg/dL (ref 6–23)
CO2: 25 mEq/L (ref 19–32)
CREATININE: 0.71 mg/dL (ref 0.50–1.35)
Calcium: 8.2 mg/dL — ABNORMAL LOW (ref 8.4–10.5)
Chloride: 107 mEq/L (ref 96–112)
GFR calc Af Amer: >90 mL/min (ref >90)
GFR calc non Af Amer: >90 mL/min (ref >90)
GLUCOSE: 102 mg/dL — AB (ref 70–99)
Potassium: 4.3 mEq/L (ref 3.7–5.3)
Sodium: 142 mEq/L (ref 137–147)

## 2013-08-16 LAB — URIC ACID: Uric Acid, Serum: 2.3 mg/dL — ABNORMAL LOW (ref 4.0–7.8)

## 2013-08-16 LAB — MAGNESIUM: Magnesium: 1.8 mg/dL (ref 1.5–2.5)

## 2013-08-17 LAB — BASIC METABOLIC PANEL
Anion gap: 7 (ref 5–15)
BUN: 23 mg/dL (ref 6–23)
CO2: 27 meq/L (ref 19–32)
CREATININE: 0.56 mg/dL (ref 0.50–1.35)
Calcium: 8.1 mg/dL — ABNORMAL LOW (ref 8.4–10.5)
Chloride: 107 mEq/L (ref 96–112)
GFR calc Af Amer: >90 mL/min (ref >90)
GFR calc non Af Amer: >90 mL/min (ref >90)
GLUCOSE: 101 mg/dL — AB (ref 70–99)
Potassium: 4.8 mEq/L (ref 3.7–5.3)
Sodium: 141 mEq/L (ref 137–147)

## 2013-08-17 LAB — CBC
HEMATOCRIT: 25.7 % — AB (ref 39.0–52.0)
HEMOGLOBIN: 8.4 g/dL — AB (ref 13.0–17.0)
MCH: 35.1 pg — ABNORMAL HIGH (ref 26.0–34.0)
MCHC: 32.7 g/dL (ref 30.0–36.0)
MCV: 107.5 fL — AB (ref 78.0–100.0)
Platelets: 175 10*3/uL (ref 150–400)
RBC: 2.39 MIL/uL — ABNORMAL LOW (ref 4.22–5.81)
RDW: 23.3 % — ABNORMAL HIGH (ref 11.5–15.5)
WBC: 8.7 10*3/uL (ref 4.0–10.5)

## 2013-08-17 LAB — MAGNESIUM: Magnesium: 1.9 mg/dL (ref 1.5–2.5)

## 2013-08-17 LAB — PHOSPHORUS: Phosphorus: 2.2 mg/dL — ABNORMAL LOW (ref 2.3–4.6)

## 2013-08-18 LAB — CBC WITH DIFFERENTIAL/PLATELET
Basophils Absolute: 0 10*3/uL (ref 0.0–0.1)
Basophils Relative: 0 % (ref 0–1)
EOS PCT: 0 % (ref 0–5)
Eosinophils Absolute: 0 10*3/uL (ref 0.0–0.7)
HCT: 26 % — ABNORMAL LOW (ref 39.0–52.0)
Hemoglobin: 8.4 g/dL — ABNORMAL LOW (ref 13.0–17.0)
LYMPHS ABS: 0.8 10*3/uL (ref 0.7–4.0)
LYMPHS PCT: 11 % — AB (ref 12–46)
MCH: 36.2 pg — ABNORMAL HIGH (ref 26.0–34.0)
MCHC: 32.3 g/dL (ref 30.0–36.0)
MCV: 112.1 fL — AB (ref 78.0–100.0)
Monocytes Absolute: 0.5 10*3/uL (ref 0.1–1.0)
Monocytes Relative: 7 % (ref 3–12)
NEUTROS ABS: 5.9 10*3/uL (ref 1.7–7.7)
Neutrophils Relative %: 82 % — ABNORMAL HIGH (ref 43–77)
Platelets: 136 10*3/uL — ABNORMAL LOW (ref 150–400)
RBC: 2.32 MIL/uL — AB (ref 4.22–5.81)
RDW: 24.9 % — ABNORMAL HIGH (ref 11.5–15.5)
WBC: 7.2 10*3/uL (ref 4.0–10.5)

## 2013-08-18 LAB — COMPREHENSIVE METABOLIC PANEL
ALBUMIN: 2.1 g/dL — AB (ref 3.5–5.2)
ALK PHOS: 76 U/L (ref 39–117)
ALT: 16 U/L (ref 0–53)
AST: 17 U/L (ref 0–37)
Anion gap: 10 (ref 5–15)
BUN: 28 mg/dL — AB (ref 6–23)
CHLORIDE: 111 meq/L (ref 96–112)
CO2: 25 mEq/L (ref 19–32)
Calcium: 8.1 mg/dL — ABNORMAL LOW (ref 8.4–10.5)
Creatinine, Ser: 0.62 mg/dL (ref 0.50–1.35)
GFR calc Af Amer: >90 mL/min (ref >90)
GFR calc non Af Amer: >90 mL/min (ref >90)
GLUCOSE: 106 mg/dL — AB (ref 70–99)
POTASSIUM: 4.6 meq/L (ref 3.7–5.3)
Sodium: 146 mEq/L (ref 137–147)
Total Bilirubin: 0.4 mg/dL (ref 0.3–1.2)
Total Protein: 5.5 g/dL — ABNORMAL LOW (ref 6.0–8.3)

## 2013-08-18 LAB — PREALBUMIN: PREALBUMIN: 24.8 mg/dL (ref 17.0–34.0)

## 2013-08-18 LAB — PHOSPHORUS: Phosphorus: 2.6 mg/dL (ref 2.3–4.6)

## 2013-08-18 LAB — MAGNESIUM: Magnesium: 1.9 mg/dL (ref 1.5–2.5)

## 2013-08-18 NOTE — Progress Notes (Signed)
  Subjective: The patient is in his room.  Overall looks better with more color in his face.  He is still spending most of the time on his sacral area.  Objective: Vital signs in last 24 hours:      Intake/Output from previous day:   Intake/Output this shift:    General appearance: alert, cooperative and no distress Skin: Skin color, texture, turgor normal. No rashes or lesions or large sacral ulcer to bone.  Lab Results:   Recent Labs  08/17/13 0618 08/18/13 0613  WBC 8.7 7.2  HGB 8.4* 8.4*  HCT 25.7* 26.0*  PLT 175 136*   BMET  Recent Labs  08/17/13 0730 08/18/13 0613  NA 141 146  K 4.8 4.6  CL 107 111  CO2 27 25  GLUCOSE 101* 106*  BUN 23 28*  CREATININE 0.56 0.62  CALCIUM 8.1* 8.1*   PT/INR No results found for this basename: LABPROT, INR,  in the last 72 hours ABG No results found for this basename: PHART, PCO2, PO2, HCO3,  in the last 72 hours  Studies/Results: No results found.  Anti-infectives: Anti-infectives   None      Assessment/Plan: s/p * No surgery found * Patient is to try to stay off the sacral area for the majority of the day.  if he is able to do this, we will take him to the OR next Monday for debridement and possible Acell/VAC placement.  LOS: 5 days    [facility] 08/18/2013

## 2013-08-19 ENCOUNTER — Encounter (HOSPITAL_COMMUNITY): Payer: Self-pay | Admitting: Pharmacy Technician

## 2013-08-20 LAB — CBC
HCT: 25.2 % — ABNORMAL LOW (ref 39.0–52.0)
HEMOGLOBIN: 8.2 g/dL — AB (ref 13.0–17.0)
MCH: 36 pg — AB (ref 26.0–34.0)
MCHC: 32.5 g/dL (ref 30.0–36.0)
MCV: 110.5 fL — ABNORMAL HIGH (ref 78.0–100.0)
Platelets: 143 10*3/uL — ABNORMAL LOW (ref 150–400)
RBC: 2.28 MIL/uL — ABNORMAL LOW (ref 4.22–5.81)
WBC: 6.6 10*3/uL (ref 4.0–10.5)

## 2013-08-20 LAB — BASIC METABOLIC PANEL
Anion gap: 8 (ref 5–15)
BUN: 25 mg/dL — ABNORMAL HIGH (ref 6–23)
CALCIUM: 8 mg/dL — AB (ref 8.4–10.5)
CO2: 27 mEq/L (ref 19–32)
Chloride: 105 mEq/L (ref 96–112)
Creatinine, Ser: 0.58 mg/dL (ref 0.50–1.35)
Glucose, Bld: 88 mg/dL (ref 70–99)
POTASSIUM: 4.7 meq/L (ref 3.7–5.3)
SODIUM: 140 meq/L (ref 137–147)

## 2013-08-20 LAB — PHOSPHORUS: PHOSPHORUS: 3.1 mg/dL (ref 2.3–4.6)

## 2013-08-20 LAB — MAGNESIUM: Magnesium: 2.1 mg/dL (ref 1.5–2.5)

## 2013-08-22 ENCOUNTER — Telehealth: Payer: Self-pay | Admitting: Oncology

## 2013-08-22 ENCOUNTER — Other Ambulatory Visit: Payer: Self-pay | Admitting: Plastic Surgery

## 2013-08-22 DIAGNOSIS — L89154 Pressure ulcer of sacral region, stage 4: Secondary | ICD-10-CM

## 2013-08-22 NOTE — Telephone Encounter (Signed)
new schedule change for bs friday afternoons. s/w pt re changing appts for 8/21 and per pt he is currently in the hosp - cx appt and he will call back to r/s.

## 2013-08-25 ENCOUNTER — Encounter: Payer: Self-pay | Admitting: Plastic Surgery

## 2013-08-25 ENCOUNTER — Encounter
Admission: AD | Disposition: A | Discharge status: Skilled Nursing Facility | Payer: Self-pay | Source: Ambulatory Visit | Attending: Internal Medicine

## 2013-08-25 ENCOUNTER — Encounter (HOSPITAL_COMMUNITY): Payer: Self-pay | Admitting: Anesthesiology

## 2013-08-25 HISTORY — PX: INCISION AND DRAINAGE OF WOUND: SHX1803

## 2013-08-25 LAB — CBC WITH DIFFERENTIAL/PLATELET
Basophils Absolute: 0 10*3/uL (ref 0.0–0.1)
Basophils Relative: 0 % (ref 0–1)
Eosinophils Absolute: 0 10*3/uL (ref 0.0–0.7)
Eosinophils Relative: 0 % (ref 0–5)
HCT: 26.6 % — ABNORMAL LOW (ref 39.0–52.0)
Hemoglobin: 8.8 g/dL — ABNORMAL LOW (ref 13.0–17.0)
LYMPHS ABS: 0.8 10*3/uL (ref 0.7–4.0)
Lymphocytes Relative: 13 % (ref 12–46)
MCH: 37.9 pg — AB (ref 26.0–34.0)
MCHC: 33.1 g/dL (ref 30.0–36.0)
MCV: 114.7 fL — AB (ref 78.0–100.0)
MONOS PCT: 5 % (ref 3–12)
Monocytes Absolute: 0.3 10*3/uL (ref 0.1–1.0)
NEUTROS ABS: 5.3 10*3/uL (ref 1.7–7.7)
Neutrophils Relative %: 82 % — ABNORMAL HIGH (ref 43–77)
Platelets: 209 10*3/uL (ref 150–400)
RBC: 2.32 MIL/uL — AB (ref 4.22–5.81)
WBC: 6.4 10*3/uL (ref 4.0–10.5)

## 2013-08-25 LAB — COMPREHENSIVE METABOLIC PANEL
ALT: 17 U/L (ref 0–53)
AST: 18 U/L (ref 0–37)
Albumin: 2.3 g/dL — ABNORMAL LOW (ref 3.5–5.2)
Alkaline Phosphatase: 71 U/L (ref 39–117)
Anion gap: 8 (ref 5–15)
BILIRUBIN TOTAL: 0.4 mg/dL (ref 0.3–1.2)
BUN: 32 mg/dL — ABNORMAL HIGH (ref 6–23)
CHLORIDE: 109 meq/L (ref 96–112)
CO2: 27 meq/L (ref 19–32)
CREATININE: 0.66 mg/dL (ref 0.50–1.35)
Calcium: 8.5 mg/dL (ref 8.4–10.5)
GFR calc non Af Amer: >90 mL/min (ref >90)
Glucose, Bld: 100 mg/dL — ABNORMAL HIGH (ref 70–99)
Potassium: 4.8 mEq/L (ref 3.7–5.3)
Sodium: 144 mEq/L (ref 137–147)
Total Protein: 5.4 g/dL — ABNORMAL LOW (ref 6.0–8.3)

## 2013-08-25 LAB — T-HELPER CELLS (CD4) COUNT (NOT AT ARMC)
CD4 % Helper T Cell: 16 % — ABNORMAL LOW (ref 33–55)
CD4 T Cell Abs: 130 /uL — ABNORMAL LOW (ref 400–2700)

## 2013-08-25 LAB — PREALBUMIN: Prealbumin: 28.7 mg/dL (ref 17.0–34.0)

## 2013-08-25 LAB — SEDIMENTATION RATE: Sed Rate: 15 mm/hr (ref 0–16)

## 2013-08-25 LAB — C-REACTIVE PROTEIN: CRP: <0.5 mg/dL — ABNORMAL LOW (ref <0.60)

## 2013-08-25 SURGERY — IRRIGATION AND DEBRIDEMENT WOUND
Anesthesia: General | Site: Buttocks

## 2013-08-25 MED ORDER — OXYCODONE HCL 5 MG PO TABS
5.0000 mg | ORAL_TABLET | Freq: Four times a day (QID) | ORAL | Status: DC | PRN
  Administered 2013-08-25: 5 mg via ORAL

## 2013-08-25 MED ORDER — ONDANSETRON HCL 4 MG/2ML IJ SOLN
INTRAMUSCULAR | Status: DC | PRN
  Administered 2013-08-25: 4 mg via INTRAVENOUS

## 2013-08-25 MED ORDER — ARTIFICIAL TEARS OP OINT
TOPICAL_OINTMENT | OPHTHALMIC | Status: DC | PRN
  Administered 2013-08-25: 1 via OPHTHALMIC

## 2013-08-25 MED ORDER — DEXAMETHASONE SODIUM PHOSPHATE 4 MG/ML IJ SOLN
INTRAMUSCULAR | Status: DC | PRN
  Administered 2013-08-25: 4 mg via INTRAVENOUS

## 2013-08-25 MED ORDER — FENTANYL CITRATE 0.05 MG/ML IJ SOLN
INTRAMUSCULAR | Status: DC | PRN
Start: 2013-08-25 — End: 2013-08-25
  Administered 2013-08-25: 100 ug via INTRAVENOUS
  Administered 2013-08-25: 25 ug via INTRAVENOUS
  Administered 2013-08-25: 50 ug via INTRAVENOUS
  Administered 2013-08-25: 25 ug via INTRAVENOUS

## 2013-08-25 MED ORDER — PROMETHAZINE HCL 25 MG/ML IJ SOLN: 6.2500 mg | INTRAMUSCULAR | Status: DC | PRN

## 2013-08-25 MED ORDER — MEPERIDINE HCL 25 MG/ML IJ SOLN: 6.2500 mg | INTRAMUSCULAR | Status: DC | PRN

## 2013-08-25 MED ORDER — FENTANYL CITRATE 0.05 MG/ML IJ SOLN
25.0000 ug | INTRAMUSCULAR | Status: DC | PRN
  Administered 2013-08-25 (×2): 50 ug via INTRAVENOUS

## 2013-08-25 MED ORDER — PROPOFOL 10 MG/ML IV BOLUS
INTRAVENOUS | Status: AC
  Filled 2013-08-25: qty 20

## 2013-08-25 MED ORDER — SODIUM CHLORIDE 0.9 % IR SOLN
Status: DC | PRN
  Administered 2013-08-25: 15:00:00

## 2013-08-25 MED ORDER — LIDOCAINE HCL (CARDIAC) 20 MG/ML IV SOLN
INTRAVENOUS | Status: DC | PRN
  Administered 2013-08-25: 50 mg via INTRAVENOUS

## 2013-08-25 MED ORDER — LACTATED RINGERS IV SOLN
INTRAVENOUS | Status: DC | PRN
  Administered 2013-08-25 (×2): via INTRAVENOUS

## 2013-08-25 MED ORDER — PROPOFOL 10 MG/ML IV BOLUS
INTRAVENOUS | Status: DC | PRN
  Administered 2013-08-25: 100 mg via INTRAVENOUS

## 2013-08-25 MED ORDER — GLYCOPYRROLATE 0.2 MG/ML IJ SOLN
INTRAMUSCULAR | Status: DC | PRN
  Administered 2013-08-25: 0.4 mg via INTRAVENOUS

## 2013-08-25 MED ORDER — FENTANYL CITRATE 0.05 MG/ML IJ SOLN
INTRAMUSCULAR | Status: AC
  Filled 2013-08-25: qty 5

## 2013-08-25 MED ORDER — HYDROCODONE-ACETAMINOPHEN 5-325 MG PO TABS: 1.0000 | ORAL_TABLET | Freq: Four times a day (QID) | ORAL | Status: DC | PRN

## 2013-08-25 MED ORDER — NEOSTIGMINE METHYLSULFATE 10 MG/10ML IV SOLN
INTRAVENOUS | Status: DC | PRN
  Administered 2013-08-25: 3 mg via INTRAVENOUS

## 2013-08-25 MED ORDER — ROCURONIUM BROMIDE 100 MG/10ML IV SOLN
INTRAVENOUS | Status: DC | PRN
  Administered 2013-08-25: 30 mg via INTRAVENOUS

## 2013-08-25 MED ORDER — MIDAZOLAM HCL 2 MG/2ML IJ SOLN
INTRAMUSCULAR | Status: AC
  Filled 2013-08-25: qty 2

## 2013-08-25 MED ORDER — MIDAZOLAM HCL 5 MG/5ML IJ SOLN
INTRAMUSCULAR | Status: DC | PRN
  Administered 2013-08-25 (×2): 1 mg via INTRAVENOUS

## 2013-08-25 MED ORDER — 0.9 % SODIUM CHLORIDE (POUR BTL) OPTIME
TOPICAL | Status: DC | PRN
  Administered 2013-08-25: 1000 mL

## 2013-08-25 MED ORDER — NEOSTIGMINE METHYLSULFATE 10 MG/10ML IV SOLN
INTRAVENOUS | Status: AC
  Filled 2013-08-25: qty 2

## 2013-08-25 MED ORDER — DEXAMETHASONE SODIUM PHOSPHATE 4 MG/ML IJ SOLN
INTRAMUSCULAR | Status: AC
  Filled 2013-08-25: qty 1

## 2013-08-25 SURGICAL SUPPLY — 44 items
BAG DECANTER FOR FLEXI CONT (MISCELLANEOUS) ×2 IMPLANT
BLADE SURG ROTATE 9660 (MISCELLANEOUS) IMPLANT
BNDG GAUZE ELAST 4 BULKY (GAUZE/BANDAGES/DRESSINGS) IMPLANT
CANISTER SUCTION 2500CC (MISCELLANEOUS) ×3 IMPLANT
CHLORAPREP W/TINT 26ML (MISCELLANEOUS) IMPLANT
CONT SPEC STER OR (MISCELLANEOUS) IMPLANT
CONT SPECI 4OZ STER CLIK (MISCELLANEOUS) ×2 IMPLANT
COVER SURGICAL LIGHT HANDLE (MISCELLANEOUS) ×3 IMPLANT
DRAPE INCISE IOBAN 66X45 STRL (DRAPES) IMPLANT
DRAPE PED LAPAROTOMY (DRAPES) ×3 IMPLANT
DRAPE PROXIMA HALF (DRAPES) IMPLANT
DRESSING ALLEVYN LIFE SACRUM (GAUZE/BANDAGES/DRESSINGS) ×2 IMPLANT
DRSG ADAPTIC 3X8 NADH LF (GAUZE/BANDAGES/DRESSINGS) ×2 IMPLANT
DRSG PAD ABDOMINAL 8X10 ST (GAUZE/BANDAGES/DRESSINGS) ×3 IMPLANT
DRSG VAC ATS LRG SENSATRAC (GAUZE/BANDAGES/DRESSINGS) IMPLANT
DRSG VAC ATS MED SENSATRAC (GAUZE/BANDAGES/DRESSINGS) IMPLANT
DRSG VAC ATS SM SENSATRAC (GAUZE/BANDAGES/DRESSINGS) IMPLANT
ELECT CAUTERY BLADE 6.4 (BLADE) ×3 IMPLANT
ELECT REM PT RETURN 9FT ADLT (ELECTROSURGICAL) ×3
ELECTRODE REM PT RTRN 9FT ADLT (ELECTROSURGICAL) ×1 IMPLANT
GAUZE SPONGE 4X4 12PLY STRL (GAUZE/BANDAGES/DRESSINGS) ×3 IMPLANT
GLOVE BIO SURGEON STRL SZ 6.5 (GLOVE) ×3 IMPLANT
GLOVE BIO SURGEONS STRL SZ 6.5 (GLOVE) ×2
GLOVE BIOGEL PI IND STRL 7.0 (GLOVE) IMPLANT
GLOVE BIOGEL PI INDICATOR 7.0 (GLOVE) ×4
GLOVE SURG SS PI 7.5 STRL IVOR (GLOVE) ×2 IMPLANT
GOWN STRL REUS W/ TWL LRG LVL3 (GOWN DISPOSABLE) ×2 IMPLANT
GOWN STRL REUS W/TWL LRG LVL3 (GOWN DISPOSABLE) ×12
H R LUBE JELLY XXX (MISCELLANEOUS) ×2 IMPLANT
KIT BASIN OR (CUSTOM PROCEDURE TRAY) ×3 IMPLANT
KIT ROOM TURNOVER OR (KITS) ×3 IMPLANT
MATRIX SURGICAL PSM 7X10CM (Tissue) ×2 IMPLANT
MICROMATRIX 1000MG (Tissue) ×3 IMPLANT
NS IRRIG 1000ML POUR BTL (IV SOLUTION) ×3 IMPLANT
PACK GENERAL/GYN (CUSTOM PROCEDURE TRAY) ×3 IMPLANT
PAD ARMBOARD 7.5X6 YLW CONV (MISCELLANEOUS) ×6 IMPLANT
SOLUTION PARTIC MCRMTRX 1000MG (Tissue) IMPLANT
SURGILUBE 2OZ TUBE FLIPTOP (MISCELLANEOUS) IMPLANT
SUT VIC AB 5-0 PS2 18 (SUTURE) ×6 IMPLANT
SWAB COLLECTION DEVICE MRSA (MISCELLANEOUS) ×6 IMPLANT
TOWEL OR 17X24 6PK STRL BLUE (TOWEL DISPOSABLE) ×3 IMPLANT
TOWEL OR 17X26 10 PK STRL BLUE (TOWEL DISPOSABLE) ×3 IMPLANT
TUBE ANAEROBIC SPECIMEN COL (MISCELLANEOUS) ×6 IMPLANT
UNDERPAD 30X30 INCONTINENT (UNDERPADS AND DIAPERS) ×3 IMPLANT

## 2013-08-25 NOTE — Brief Op Note (Signed)
08/13/2013 - 08/25/2013  3:23 PM  PATIENT:  [name] [name]  53 y.o. male  PRE-OPERATIVE DIAGNOSIS:  Sacral Ulcer  POST-OPERATIVE DIAGNOSIS:  Sacral Ulcer  PROCEDURE:  Procedure(s): IRRIGATION AND DEBRIDEMENT OF SACRAL ULCER WITH PLACEMENT OF A CELL AND VAC (N/A)  SURGEON:  Surgeon(s) and Role:    * Maanasa Aderhold Sanger, DO - Primary  PHYSICIAN ASSISTANT: Shawn Rayburn, PA  ASSISTANTS: none   ANESTHESIA:   general  EBL:  Total I/O In: 1000 [I.V.:1000] Out: 15 [Blood:15]  BLOOD ADMINISTERED:none  DRAINS: none   LOCAL MEDICATIONS USED:  NONE  SPECIMEN:  Source of Specimen:  bone  DISPOSITION OF SPECIMEN:  micro  COUNTS:  YES  TOURNIQUET:  * No tourniquets in log *  DICTATION: .Dragon Dictation  PLAN OF CARE: Admit to inpatient   PATIENT DISPOSITION:  stable   Delay start of Pharmacological VTE agent (>24hrs) due to surgical blood loss or risk of bleeding: no

## 2013-08-25 NOTE — Anesthesia Procedure Notes (Signed)
Procedure Name: Intubation Date/Time: 08/25/2013 2:44 PM Performed by: Carola Frost Pre-anesthesia Checklist: Patient identified, Emergency Drugs available, Suction available, Patient being monitored and Timeout performed Patient Re-evaluated:Patient Re-evaluated prior to inductionOxygen Delivery Method: Circle system utilized Preoxygenation: Pre-oxygenation with 100% oxygen Intubation Type: IV induction Ventilation: Mask ventilation without difficulty Laryngoscope Size: Mac and 4 Grade View: Grade II Tube type: Oral Tube size: 7.5 mm Number of attempts: 1 Airway Equipment and Method: Stylet Placement Confirmation: CO2 detector,  positive ETCO2,  ETT inserted through vocal cords under direct vision and breath sounds checked- equal and bilateral Secured at: 23 cm Tube secured with: Tape Dental Injury: Teeth and Oropharynx as per pre-operative assessment

## 2013-08-25 NOTE — Anesthesia Preprocedure Evaluation (Addendum)
Anesthesia Evaluation  Patient identified by MRN, date of birth, ID band Patient awake    Reviewed: Allergy & Precautions, H&P , NPO status , Patient's Chart, lab work & pertinent test results  Airway Mallampati: I TM Distance: >3 FB Neck ROM: Full    Dental  (+) Teeth Intact, Dental Advisory Given, Poor Dentition   Pulmonary Current Smoker,  breath sounds clear to auscultation        Cardiovascular + Peripheral Vascular Disease Rhythm:Regular Rate:Normal     Neuro/Psych    GI/Hepatic   Endo/Other    Renal/GU      Musculoskeletal   Abdominal   Peds  Hematology  (+) HIV,   Anesthesia Other Findings   Reproductive/Obstetrics                        Anesthesia Physical Anesthesia Plan  ASA: III  Anesthesia Plan: General and General ETT   Post-op Pain Management:    Induction: Intravenous  Airway Management Planned:   Additional Equipment:   Intra-op Plan:   Post-operative Plan: Extubation in OR  Informed Consent: I have reviewed the patients History and Physical, chart, labs and discussed the procedure including the risks, benefits and alternatives for the proposed anesthesia with the patient or authorized representative who has indicated his/her understanding and acceptance.   Dental advisory given  Plan Discussed with: Anesthesiologist and Surgeon  Anesthesia Plan Comments:       Anesthesia Quick Evaluation

## 2013-08-25 NOTE — Transfer of Care (Signed)
Immediate Anesthesia Transfer of Care Note  Patient: [name] [name]  Procedure(s) Performed: Procedure(s): IRRIGATION AND DEBRIDEMENT OF SACRAL ULCER WITH PLACEMENT OF A CELL  (N/A)  Patient Location: PACU  Anesthesia Type:General  Level of Consciousness: awake, alert  and oriented  Airway & Oxygen Therapy: Patient Spontanous Breathing and Patient connected to nasal cannula oxygen  Post-op Assessment: Report given to PACU RN, Post -op Vital signs reviewed and stable and Patient moving all extremities X 4  Post vital signs: Reviewed and stable  Complications: No apparent anesthesia complications

## 2013-08-25 NOTE — H&P (View-Only) (Signed)
  Subjective: The patient is in his room.  Overall looks better with more color in his face.  He is still spending most of the time on his sacral area.  Objective: Vital signs in last 24 hours:      Intake/Output from previous day:   Intake/Output this shift:    General appearance: alert, cooperative and no distress Skin: Skin color, texture, turgor normal. No rashes or lesions or large sacral ulcer to bone.  Lab Results:   Recent Labs  08/17/13 0618 08/18/13 0613  WBC 8.7 7.2  HGB 8.4* 8.4*  HCT 25.7* 26.0*  PLT 175 136*   BMET  Recent Labs  08/17/13 0730 08/18/13 0613  NA 141 146  K 4.8 4.6  CL 107 111  CO2 27 25  GLUCOSE 101* 106*  BUN 23 28*  CREATININE 0.56 0.62  CALCIUM 8.1* 8.1*   PT/INR No results found for this basename: LABPROT, INR,  in the last 72 hours ABG No results found for this basename: PHART, PCO2, PO2, HCO3,  in the last 72 hours  Studies/Results: No results found.  Anti-infectives: Anti-infectives   None      Assessment/Plan: s/p * No surgery found * Patient is to try to stay off the sacral area for the majority of the day.  if he is able to do this, we will take him to the OR next Monday for debridement and possible Acell/VAC placement.  LOS: 5 days    [facility] 08/18/2013

## 2013-08-25 NOTE — Op Note (Signed)
Operative Note   DATE OF OPERATION: 08/25/2013  LOCATION: [facility]  SURGICAL DIVISION: Plastic Surgery  PREOPERATIVE DIAGNOSES:  Sacral ulcer  POSTOPERATIVE DIAGNOSES:  same  PROCEDURE:  Preparation of sacral ulcer (7 x 10cm) for Acell with debridement of skin, soft tissue and bone (7 x 10 sheet and 1 gm Acell)  SURGEON: Theodoro Kos, DO  ASSISTANT: Shawn Rayburn, PA  ANESTHESIA:  General.   COMPLICATIONS: None.   INDICATIONS FOR PROCEDURE:  The patient, [name] [name] is a 53 y.o. male born on 11-Jun-1960, is here for treatment of a sacral ulcer. MRN: [id number]  CONSENT:  Informed consent was obtained directly from the patient. Risks, benefits and alternatives were fully discussed. Specific risks including but not limited to bleeding, infection, hematoma, seroma, scarring, pain, infection, contracture, asymmetry, wound healing problems, and need for further surgery were all discussed. The patient did have an ample opportunity to have questions answered to satisfaction.   DESCRIPTION OF PROCEDURE:  The patient was taken to the operating room. SCDs were placed. The patient's operative site was prepped and draped in a sterile fashion. A time out was performed and all information was confirmed to be correct.  General anesthesia was administered.  The patient had sharp debridement of the sacral bone that was exposed.  There was concern that too deep of a debridement would expose the nerve and therefore it was done gently.  Cultures were went.  The area was irrigated with antibiotic solution.  Hemostasis was achieved with electrocautery.  The acell powder and sheet were applied.  The sheet was secured with 5-0 Vicryl.  An adaptic was placed with KY gel and a sterile dressing. The patient tolerated the procedure well.  There were no complications. The patient was allowed to wake from anesthesia, extubated and taken to the recovery room in satisfactory condition.

## 2013-08-25 NOTE — Interval H&P Note (Signed)
History and Physical Interval Note:  08/25/2013 11:16 AM  [name] [name]  has presented today for surgery, with the diagnosis of Sacral Ulcer  The various methods of treatment have been discussed with the patient and family. After consideration of risks, benefits and other options for treatment, the patient has consented to  Procedure(s): IRRIGATION AND DEBRIDEMENT OF SACRAL ULCER WITH PLACEMENT OF A CELL AND VAC (N/A) as a surgical intervention .  The patient's history has been reviewed, patient examined, no change in status, stable for surgery.  I have reviewed the patient's chart and labs.  Questions were answered to the patient's satisfaction.     SANGER,Mayan Dolney

## 2013-08-27 ENCOUNTER — Ambulatory Visit: Payer: No Typology Code available for payment source | Admitting: Infectious Disease

## 2013-08-27 ENCOUNTER — Encounter (HOSPITAL_COMMUNITY): Payer: Self-pay | Admitting: Plastic Surgery

## 2013-08-27 NOTE — Anesthesia Postprocedure Evaluation (Addendum)
  Anesthesia Post-op Note  Patient: [name] [name]  Procedure(s) Performed: Procedure(s): IRRIGATION AND DEBRIDEMENT OF SACRAL ULCER WITH PLACEMENT OF A CELL  (N/A)  Patient Location: PACU  Anesthesia Type:General  Level of Consciousness: awake, alert  and oriented  Airway and Oxygen Therapy: Patient Spontanous Breathing and Patient connected to nasal cannula oxygen  Post-op Pain: mild  Post-op Assessment: Post-op Vital signs reviewed, Patient's Cardiovascular Status Stable and Respiratory Function Stable  Post-op Vital Signs: Reviewed and stable  Last Vitals:  Filed Vitals:   08/25/13 1630  BP: 118/81  Pulse: 47  Temp:   Resp: 12    Complications: No apparent anesthesia complications

## 2013-08-28 LAB — CBC WITH DIFFERENTIAL/PLATELET
BASOS PCT: 0 % (ref 0–1)
Basophils Absolute: 0 10*3/uL (ref 0.0–0.1)
EOS PCT: 0 % (ref 0–5)
Eosinophils Absolute: 0 10*3/uL (ref 0.0–0.7)
HEMATOCRIT: 27.5 % — AB (ref 39.0–52.0)
HEMOGLOBIN: 9 g/dL — AB (ref 13.0–17.0)
LYMPHS ABS: 0.4 10*3/uL — AB (ref 0.7–4.0)
Lymphocytes Relative: 9 % — ABNORMAL LOW (ref 12–46)
MCH: 38.3 pg — ABNORMAL HIGH (ref 26.0–34.0)
MCHC: 32.7 g/dL (ref 30.0–36.0)
MCV: 117 fL — AB (ref 78.0–100.0)
MONO ABS: 0.2 10*3/uL (ref 0.1–1.0)
Monocytes Relative: 4 % (ref 3–12)
NEUTROS PCT: 87 % — AB (ref 43–77)
Neutro Abs: 4.4 10*3/uL (ref 1.7–7.7)
Platelets: 129 10*3/uL — ABNORMAL LOW (ref 150–400)
RBC: 5.01 MIL/uL (ref 4.22–5.81)
WBC: 5 10*3/uL (ref 4.0–10.5)

## 2013-08-28 LAB — BASIC METABOLIC PANEL
Anion gap: 8 (ref 5–15)
BUN: 29 mg/dL — ABNORMAL HIGH (ref 6–23)
CALCIUM: 8.2 mg/dL — AB (ref 8.4–10.5)
CO2: 26 mEq/L (ref 19–32)
CREATININE: 0.6 mg/dL (ref 0.50–1.35)
Chloride: 108 mEq/L (ref 96–112)
Glucose, Bld: 106 mg/dL — ABNORMAL HIGH (ref 70–99)
POTASSIUM: 4.4 meq/L (ref 3.7–5.3)
Sodium: 142 mEq/L (ref 137–147)

## 2013-08-28 LAB — PHOSPHORUS: Phosphorus: 2.4 mg/dL (ref 2.3–4.6)

## 2013-08-28 LAB — MAGNESIUM: Magnesium: 2 mg/dL (ref 1.5–2.5)

## 2013-08-29 ENCOUNTER — Ambulatory Visit: Payer: Self-pay | Admitting: Oncology

## 2013-08-29 LAB — TISSUE CULTURE
GRAM STAIN: NONE SEEN
GRAM STAIN: NONE SEEN

## 2013-08-29 NOTE — Addendum Note (Signed)
Addendum created 08/29/13 1129 by Alexis Frock, MD   Modules edited: Anesthesia Attestations, Notes Section   Notes Section:  File: [id number]

## 2013-08-29 NOTE — Anesthesia Postprocedure Evaluation (Signed)
  Anesthesia Post-op Note  Patient: [name] [name]  Procedure(s) Performed: Procedure(s): IRRIGATION AND DEBRIDEMENT OF SACRAL ULCER WITH PLACEMENT OF A CELL  (N/A)  Patient Location: PACU  Anesthesia Type:General  Level of Consciousness: awake  Airway and Oxygen Therapy: Patient Spontanous Breathing  Post-op Pain: mild  Post-op Assessment: Post-op Vital signs reviewed, Patient's Cardiovascular Status Stable and Respiratory Function Stable  Post-op Vital Signs: Reviewed and stable  Last Vitals:  Filed Vitals:   08/25/13 1630  BP: 118/81  Pulse: 47  Temp:   Resp: 12    Complications: No apparent anesthesia complications

## 2013-08-30 LAB — TISSUE CULTURE: GRAM STAIN: NONE SEEN

## 2013-08-30 LAB — ANAEROBIC CULTURE
GRAM STAIN: NONE SEEN
GRAM STAIN: NONE SEEN
Gram Stain: NONE SEEN

## 2013-09-01 ENCOUNTER — Encounter
Admission: AD | Disposition: A | Discharge status: Skilled Nursing Facility | Payer: Self-pay | Source: Ambulatory Visit | Attending: Internal Medicine

## 2013-09-01 ENCOUNTER — Encounter (HOSPITAL_COMMUNITY): Payer: Self-pay | Admitting: Certified Registered Nurse Anesthetist

## 2013-09-01 ENCOUNTER — Other Ambulatory Visit: Payer: Self-pay | Admitting: Plastic Surgery

## 2013-09-01 ENCOUNTER — Encounter: Payer: Self-pay | Admitting: Plastic Surgery

## 2013-09-01 DIAGNOSIS — L89154 Pressure ulcer of sacral region, stage 4: Secondary | ICD-10-CM

## 2013-09-01 HISTORY — PX: INCISION AND DRAINAGE OF WOUND: SHX1803

## 2013-09-01 LAB — CBC WITH DIFFERENTIAL/PLATELET
BASOS PCT: 0 % (ref 0–1)
Basophils Absolute: 0 10*3/uL (ref 0.0–0.1)
EOS ABS: 0 10*3/uL (ref 0.0–0.7)
Eosinophils Relative: 0 % (ref 0–5)
HEMATOCRIT: 30.4 % — AB (ref 39.0–52.0)
Hemoglobin: 10.1 g/dL — ABNORMAL LOW (ref 13.0–17.0)
LYMPHS ABS: 0.8 10*3/uL (ref 0.7–4.0)
Lymphocytes Relative: 18 % (ref 12–46)
MCH: 39.9 pg — AB (ref 26.0–34.0)
MCHC: 33.2 g/dL (ref 30.0–36.0)
MCV: 120.2 fL — ABNORMAL HIGH (ref 78.0–100.0)
MONO ABS: 0.3 10*3/uL (ref 0.1–1.0)
Monocytes Relative: 6 % (ref 3–12)
NEUTROS ABS: 3.6 10*3/uL (ref 1.7–7.7)
Neutrophils Relative %: 76 % (ref 43–77)
Platelets: 137 10*3/uL — ABNORMAL LOW (ref 150–400)
RBC: 2.53 MIL/uL — ABNORMAL LOW (ref 4.22–5.81)
WBC: 4.7 10*3/uL (ref 4.0–10.5)

## 2013-09-01 LAB — COMPREHENSIVE METABOLIC PANEL
ALT: 22 U/L (ref 0–53)
AST: 21 U/L (ref 0–37)
Albumin: 2.4 g/dL — ABNORMAL LOW (ref 3.5–5.2)
Alkaline Phosphatase: 68 U/L (ref 39–117)
Anion gap: 10 (ref 5–15)
BUN: 28 mg/dL — ABNORMAL HIGH (ref 6–23)
CO2: 25 meq/L (ref 19–32)
CREATININE: 0.57 mg/dL (ref 0.50–1.35)
Calcium: 8.3 mg/dL — ABNORMAL LOW (ref 8.4–10.5)
Chloride: 105 mEq/L (ref 96–112)
GLUCOSE: 116 mg/dL — AB (ref 70–99)
Potassium: 4.5 mEq/L (ref 3.7–5.3)
Sodium: 140 mEq/L (ref 137–147)
Total Bilirubin: 0.4 mg/dL (ref 0.3–1.2)
Total Protein: 5.4 g/dL — ABNORMAL LOW (ref 6.0–8.3)

## 2013-09-01 LAB — SEDIMENTATION RATE: SED RATE: 5 mm/h (ref 0–16)

## 2013-09-01 LAB — PREALBUMIN: PREALBUMIN: 32.4 mg/dL (ref 17.0–34.0)

## 2013-09-01 LAB — C-REACTIVE PROTEIN: CRP: <0.5 mg/dL — ABNORMAL LOW (ref <0.60)

## 2013-09-01 SURGERY — IRRIGATION AND DEBRIDEMENT WOUND
Anesthesia: General | Site: Buttocks

## 2013-09-01 MED ORDER — MIDAZOLAM HCL 5 MG/5ML IJ SOLN
INTRAMUSCULAR | Status: DC | PRN
  Administered 2013-09-01: 2 mg via INTRAVENOUS

## 2013-09-01 MED ORDER — ROCURONIUM BROMIDE 100 MG/10ML IV SOLN
INTRAVENOUS | Status: DC | PRN
  Administered 2013-09-01: 30 mg via INTRAVENOUS

## 2013-09-01 MED ORDER — NEOSTIGMINE METHYLSULFATE 10 MG/10ML IV SOLN
INTRAVENOUS | Status: DC | PRN
  Administered 2013-09-01: 3 mg via INTRAVENOUS

## 2013-09-01 MED ORDER — FENTANYL CITRATE 0.05 MG/ML IJ SOLN
INTRAMUSCULAR | Status: AC
  Filled 2013-09-01: qty 5

## 2013-09-01 MED ORDER — ONDANSETRON HCL 4 MG/2ML IJ SOLN
INTRAMUSCULAR | Status: AC
  Filled 2013-09-01: qty 2

## 2013-09-01 MED ORDER — STERILE WATER FOR INJECTION IJ SOLN
INTRAMUSCULAR | Status: AC
  Filled 2013-09-01: qty 10

## 2013-09-01 MED ORDER — ONDANSETRON HCL 4 MG/2ML IJ SOLN
INTRAMUSCULAR | Status: DC | PRN
Start: 2013-09-01 — End: 2013-09-01
  Administered 2013-09-01: 4 mg via INTRAVENOUS

## 2013-09-01 MED ORDER — LACTATED RINGERS IV SOLN
INTRAVENOUS | Status: DC | PRN
  Administered 2013-09-01: 13:00:00 via INTRAVENOUS

## 2013-09-01 MED ORDER — FENTANYL CITRATE 0.05 MG/ML IJ SOLN
INTRAMUSCULAR | Status: DC | PRN
  Administered 2013-09-01 (×2): 100 ug via INTRAVENOUS
  Administered 2013-09-01: 50 ug via INTRAVENOUS

## 2013-09-01 MED ORDER — PHENYLEPHRINE HCL 10 MG/ML IJ SOLN
INTRAMUSCULAR | Status: DC | PRN
  Administered 2013-09-01: 80 ug via INTRAVENOUS

## 2013-09-01 MED ORDER — LACTATED RINGERS IV SOLN
INTRAVENOUS | Status: DC
  Administered 2013-09-01 – 2013-09-08 (×2): via INTRAVENOUS

## 2013-09-01 MED ORDER — PROPOFOL 10 MG/ML IV BOLUS
INTRAVENOUS | Status: AC
  Filled 2013-09-01: qty 20

## 2013-09-01 MED ORDER — EPHEDRINE SULFATE 50 MG/ML IJ SOLN
INTRAMUSCULAR | Status: AC
  Filled 2013-09-01: qty 1

## 2013-09-01 MED ORDER — PROPOFOL 10 MG/ML IV BOLUS
INTRAVENOUS | Status: DC | PRN
  Administered 2013-09-01: 150 mg via INTRAVENOUS

## 2013-09-01 MED ORDER — SODIUM CHLORIDE 0.9 % IR SOLN
Status: DC | PRN
  Administered 2013-09-01: 1000 mL

## 2013-09-01 MED ORDER — MIDAZOLAM HCL 2 MG/2ML IJ SOLN
INTRAMUSCULAR | Status: AC
  Filled 2013-09-01: qty 2

## 2013-09-01 MED ORDER — LIDOCAINE HCL (CARDIAC) 20 MG/ML IV SOLN
INTRAVENOUS | Status: AC
  Filled 2013-09-01: qty 5

## 2013-09-01 MED ORDER — GLYCOPYRROLATE 0.2 MG/ML IJ SOLN
INTRAMUSCULAR | Status: DC | PRN
  Administered 2013-09-01: 0.4 mg via INTRAVENOUS

## 2013-09-01 MED ORDER — ROCURONIUM BROMIDE 50 MG/5ML IV SOLN
INTRAVENOUS | Status: AC
  Filled 2013-09-01: qty 1

## 2013-09-01 SURGICAL SUPPLY — 46 items
BAG DECANTER FOR FLEXI CONT (MISCELLANEOUS) IMPLANT
BLADE 10 SAFETY STRL DISP (BLADE) ×3 IMPLANT
BLADE SURG ROTATE 9660 (MISCELLANEOUS) IMPLANT
BNDG GAUZE ELAST 4 BULKY (GAUZE/BANDAGES/DRESSINGS) IMPLANT
CANISTER SUCTION 2500CC (MISCELLANEOUS) ×3 IMPLANT
CHLORAPREP W/TINT 26ML (MISCELLANEOUS) IMPLANT
CONT SPEC STER OR (MISCELLANEOUS) IMPLANT
COVER SURGICAL LIGHT HANDLE (MISCELLANEOUS) ×3 IMPLANT
DRAPE INCISE IOBAN 66X45 STRL (DRAPES) ×2 IMPLANT
DRAPE PED LAPAROTOMY (DRAPES) ×3 IMPLANT
DRAPE PROXIMA HALF (DRAPES) IMPLANT
DRESSING ALLEVYN LIFE SACRUM (GAUZE/BANDAGES/DRESSINGS) ×2 IMPLANT
DRSG ADAPTIC 3X8 NADH LF (GAUZE/BANDAGES/DRESSINGS) ×2 IMPLANT
DRSG PAD ABDOMINAL 8X10 ST (GAUZE/BANDAGES/DRESSINGS) ×3 IMPLANT
DRSG VAC ATS LRG SENSATRAC (GAUZE/BANDAGES/DRESSINGS) IMPLANT
DRSG VAC ATS MED SENSATRAC (GAUZE/BANDAGES/DRESSINGS) IMPLANT
DRSG VAC ATS SM SENSATRAC (GAUZE/BANDAGES/DRESSINGS) IMPLANT
ELECT CAUTERY BLADE 6.4 (BLADE) ×3 IMPLANT
ELECT REM PT RETURN 9FT ADLT (ELECTROSURGICAL) ×3
ELECTRODE REM PT RTRN 9FT ADLT (ELECTROSURGICAL) ×1 IMPLANT
GAUZE SPONGE 4X4 12PLY STRL (GAUZE/BANDAGES/DRESSINGS) ×3 IMPLANT
GLOVE BIO SURGEON STRL SZ 6 (GLOVE) ×2 IMPLANT
GLOVE BIO SURGEON STRL SZ 6.5 (GLOVE) ×3 IMPLANT
GLOVE BIO SURGEONS STRL SZ 6.5 (GLOVE) ×2
GLOVE BIOGEL PI IND STRL 6.5 (GLOVE) IMPLANT
GLOVE BIOGEL PI INDICATOR 6.5 (GLOVE) ×6
GLOVE SKINSENSE NS SZ6.5 (GLOVE) ×2
GLOVE SKINSENSE STRL SZ6.5 (GLOVE) IMPLANT
GOWN STRL REUS W/ TWL LRG LVL3 (GOWN DISPOSABLE) ×2 IMPLANT
GOWN STRL REUS W/TWL LRG LVL3 (GOWN DISPOSABLE) ×6
KIT BASIN OR (CUSTOM PROCEDURE TRAY) ×3 IMPLANT
KIT ROOM TURNOVER OR (KITS) ×3 IMPLANT
MATRIX SURGICAL PSM 7X10CM (Tissue) ×2 IMPLANT
MICROMATRIX 500MG (Tissue) ×3 IMPLANT
NS IRRIG 1000ML POUR BTL (IV SOLUTION) ×3 IMPLANT
PACK GENERAL/GYN (CUSTOM PROCEDURE TRAY) ×3 IMPLANT
PAD ARMBOARD 7.5X6 YLW CONV (MISCELLANEOUS) ×6 IMPLANT
SOLUTION PARTIC MCRMTRX 500MG (Tissue) IMPLANT
SPONGE GAUZE 4X4 12PLY STER LF (GAUZE/BANDAGES/DRESSINGS) ×2 IMPLANT
SURGILUBE 2OZ TUBE FLIPTOP (MISCELLANEOUS) IMPLANT
SUT VIC AB 5-0 PS2 18 (SUTURE) ×4 IMPLANT
SWAB COLLECTION DEVICE MRSA (MISCELLANEOUS) IMPLANT
TOWEL OR 17X24 6PK STRL BLUE (TOWEL DISPOSABLE) ×3 IMPLANT
TOWEL OR 17X26 10 PK STRL BLUE (TOWEL DISPOSABLE) ×3 IMPLANT
TUBE ANAEROBIC SPECIMEN COL (MISCELLANEOUS) IMPLANT
UNDERPAD 30X30 INCONTINENT (UNDERPADS AND DIAPERS) ×3 IMPLANT

## 2013-09-01 NOTE — Anesthesia Postprocedure Evaluation (Signed)
  Anesthesia Post-op Note  Patient: [name] [name]  Procedure(s) Performed: Procedure(s): IRRIGATION AND DEBRIDEMENT SACRAL ULCER WITH PLACEMENT OF A CELL (N/A)  Patient Location: PACU  Anesthesia Type:General  Level of Consciousness: awake and alert   Airway and Oxygen Therapy: Patient Spontanous Breathing  Post-op Pain: none  Post-op Assessment: Post-op Vital signs reviewed, Patient's Cardiovascular Status Stable, Respiratory Function Stable, Patent Airway, No signs of Nausea or vomiting and Pain level controlled  Post-op Vital Signs: Reviewed and stable  Last Vitals:  Filed Vitals:   09/01/13 1511  BP: 135/80  Pulse: 56  Temp:   Resp: 10    Complications: No apparent anesthesia complications

## 2013-09-01 NOTE — Brief Op Note (Signed)
08/13/2013 - 09/01/2013  2:11 PM  PATIENT:  [name] [name]  53 y.o. male  PRE-OPERATIVE DIAGNOSIS:  sacral ulcer  POST-OPERATIVE DIAGNOSIS:  sacral ulcer  PROCEDURE:  Procedure(s): IRRIGATION AND DEBRIDEMENT SACRAL ULCER WITH PLACEMENT OF A CELL (N/A)  SURGEON:  Surgeon(s) and Role:    * Claire Sanger, DO - Primary  PHYSICIAN ASSISTANT: Shawn Rayburn, PA  ASSISTANTS: none   ANESTHESIA:   general  EBL:     BLOOD ADMINISTERED:none  DRAINS: none   LOCAL MEDICATIONS USED:  NONE  SPECIMEN:  No Specimen  DISPOSITION OF SPECIMEN:  N/A  COUNTS:  YES  TOURNIQUET:  * No tourniquets in log *  DICTATION: .Dragon Dictation  PLAN OF CARE: return to select  PATIENT DISPOSITION:  PACU - hemodynamically stable.   Delay start of Pharmacological VTE agent (>24hrs) due to surgical blood loss or risk of bleeding: no

## 2013-09-01 NOTE — Transfer of Care (Signed)
Immediate Anesthesia Transfer of Care Note  Patient: [name] [name]  Procedure(s) Performed: Procedure(s): IRRIGATION AND DEBRIDEMENT SACRAL ULCER WITH PLACEMENT OF A CELL (N/A)  Patient Location: PACU  Anesthesia Type:General  Level of Consciousness: awake, alert , oriented and patient cooperative  Airway & Oxygen Therapy: Patient Spontanous Breathing  Post-op Assessment: Report given to PACU RN, Post -op Vital signs reviewed and stable and Patient moving all extremities  Post vital signs: Reviewed and stable  Complications: No apparent anesthesia complications

## 2013-09-01 NOTE — Anesthesia Preprocedure Evaluation (Signed)
Anesthesia Evaluation  Patient identified by MRN, date of birth, ID band Patient awake    Reviewed: Allergy & Precautions, H&P , NPO status , Patient's Chart, lab work & pertinent test results  Airway Mallampati: I TM Distance: >3 FB Neck ROM: Full    Dental  (+) Teeth Intact, Dental Advisory Given, Poor Dentition   Pulmonary Current Smoker,  breath sounds clear to auscultation        Cardiovascular + Peripheral Vascular Disease Rhythm:Regular Rate:Normal     Neuro/Psych negative neurological ROS  negative psych ROS   GI/Hepatic negative GI ROS, Neg liver ROS,   Endo/Other  negative endocrine ROS  Renal/GU negative Renal ROS     Musculoskeletal negative musculoskeletal ROS (+)   Abdominal   Peds  Hematology  (+) anemia , HIV,   Anesthesia Other Findings   Reproductive/Obstetrics negative OB ROS                           Anesthesia Physical  Anesthesia Plan  ASA: III  Anesthesia Plan: General   Post-op Pain Management:    Induction: Intravenous  Airway Management Planned: Oral ETT  Additional Equipment:   Intra-op Plan:   Post-operative Plan: Extubation in OR  Informed Consent: I have reviewed the patients History and Physical, chart, labs and discussed the procedure including the risks, benefits and alternatives for the proposed anesthesia with the patient or authorized representative who has indicated his/her understanding and acceptance.   Dental advisory given  Plan Discussed with: CRNA  Anesthesia Plan Comments:         Anesthesia Quick Evaluation                                  Anesthesia Evaluation  Patient identified by MRN, date of birth, ID band Patient awake    Reviewed: Allergy & Precautions, H&P , NPO status , Patient's Chart, lab work & pertinent test results  Airway Mallampati: I TM Distance: >3 FB Neck ROM: Full    Dental  (+) Teeth  Intact, Dental Advisory Given, Poor Dentition   Pulmonary Current Smoker,  breath sounds clear to auscultation        Cardiovascular + Peripheral Vascular Disease Rhythm:Regular Rate:Normal     Neuro/Psych    GI/Hepatic   Endo/Other    Renal/GU      Musculoskeletal   Abdominal   Peds  Hematology  (+) HIV,   Anesthesia Other Findings   Reproductive/Obstetrics                          Anesthesia Physical Anesthesia Plan  ASA: III  Anesthesia Plan: General   Post-op Pain Management:    Induction: Intravenous  Airway Management Planned: Oral ETT  Additional Equipment:   Intra-op Plan:   Post-operative Plan: Extubation in OR  Informed Consent: I have reviewed the patients History and Physical, chart, labs and discussed the procedure including the risks, benefits and alternatives for the proposed anesthesia with the patient or authorized representative who has indicated his/her understanding and acceptance.   Dental advisory given  Plan Discussed with: CRNA, Anesthesiologist and Surgeon  Anesthesia Plan Comments:         Anesthesia Quick Evaluation

## 2013-09-01 NOTE — Interval H&P Note (Signed)
History and Physical Interval Note:  09/01/2013 11:11 AM  [name] [name]  has presented today for surgery, with the diagnosis of sacral ulcer  The various methods of treatment have been discussed with the patient and family. After consideration of risks, benefits and other options for treatment, the patient has consented to  Procedure(s): IRRIGATION AND DEBRIDEMENT SACRAL ULCER WITH PLACEMENT OF A CELL AND VAC (N/A) as a surgical intervention .  The patient's history has been reviewed, patient examined, no change in status, stable for surgery.  I have reviewed the patient's chart and labs.  Questions were answered to the patient's satisfaction.     SANGER,Nechemia Chiappetta

## 2013-09-01 NOTE — Op Note (Signed)
Operative Note   DATE OF OPERATION: 09/01/2013  LOCATION: [facility]  SURGICAL DIVISION: Plastic Surgery  PREOPERATIVE DIAGNOSES:  Sacral ulcer  POSTOPERATIVE DIAGNOSES:  same  PROCEDURE:  Preparation of sacral ulcer (7 x 10cm) for Acell placement with debridement of soft tissue (7 x 10 sheet and 1 gm Acell)  SURGEON: Theodoro Kos, DO  ASSISTANT: Shawn Rayburn, PA  ANESTHESIA:  General.   COMPLICATIONS: None.   INDICATIONS FOR PROCEDURE:  The patient, [name] [name] is a 53 y.o. male born on [birth year], is here for treatment of a sacral ulcer. MRN: [id number]  CONSENT:  Informed consent was obtained directly from the patient. Risks, benefits and alternatives were fully discussed. Specific risks including but not limited to bleeding, infection, hematoma, seroma, scarring, pain, infection, contracture, asymmetry, wound healing problems, and need for further surgery were all discussed. The patient did have an ample opportunity to have questions answered to satisfaction.   DESCRIPTION OF PROCEDURE:  The patient was taken to the operating room. SCDs were placed. The patient's operative site was prepped and draped in a sterile fashion. A time out was performed and all information was confirmed to be correct.  General anesthesia was administered.  The patient had sharp debridement of the soft tissue.  The area was irrigated with antibiotic solution.   The Acell powder and sheet were applied.  The sheet was secured with 5-0 Vicryl.  An adaptic was placed with KY gel and a sterile dressing. The patient tolerated the procedure well.  There were no complications. The patient was allowed to wake from anesthesia, extubated and taken to the recovery room in satisfactory condition.

## 2013-09-02 ENCOUNTER — Encounter (HOSPITAL_COMMUNITY): Payer: Self-pay | Admitting: Plastic Surgery

## 2013-09-06 LAB — CBC
HCT: 26.6 % — ABNORMAL LOW (ref 39.0–52.0)
HEMOGLOBIN: 8.8 g/dL — AB (ref 13.0–17.0)
MCH: 40.4 pg — ABNORMAL HIGH (ref 26.0–34.0)
MCHC: 33.1 g/dL (ref 30.0–36.0)
MCV: 122 fL — ABNORMAL HIGH (ref 78.0–100.0)
Platelets: 126 10*3/uL — ABNORMAL LOW (ref 150–400)
RBC: 2.18 MIL/uL — ABNORMAL LOW (ref 4.22–5.81)
WBC: 3.4 10*3/uL — ABNORMAL LOW (ref 4.0–10.5)

## 2013-09-06 LAB — BASIC METABOLIC PANEL
Anion gap: 8 (ref 5–15)
BUN: 24 mg/dL — ABNORMAL HIGH (ref 6–23)
CALCIUM: 7.3 mg/dL — AB (ref 8.4–10.5)
CO2: 24 mEq/L (ref 19–32)
Chloride: 106 mEq/L (ref 96–112)
Creatinine, Ser: 0.55 mg/dL (ref 0.50–1.35)
GFR calc Af Amer: >90 mL/min (ref >90)
Glucose, Bld: 88 mg/dL (ref 70–99)
Potassium: 4.1 mEq/L (ref 3.7–5.3)
SODIUM: 138 meq/L (ref 137–147)

## 2013-09-08 ENCOUNTER — Encounter
Admission: AD | Disposition: A | Discharge status: Skilled Nursing Facility | Payer: Self-pay | Source: Ambulatory Visit | Attending: Internal Medicine

## 2013-09-08 ENCOUNTER — Encounter: Payer: Self-pay | Admitting: Plastic Surgery

## 2013-09-08 ENCOUNTER — Encounter (HOSPITAL_COMMUNITY): Payer: Self-pay | Admitting: Anesthesiology

## 2013-09-08 ENCOUNTER — Other Ambulatory Visit: Payer: Self-pay | Admitting: Plastic Surgery

## 2013-09-08 HISTORY — PX: INCISION AND DRAINAGE OF WOUND: SHX1803

## 2013-09-08 LAB — COMPREHENSIVE METABOLIC PANEL
ALT: 27 U/L (ref 0–53)
AST: 21 U/L (ref 0–37)
Albumin: 2.6 g/dL — ABNORMAL LOW (ref 3.5–5.2)
Alkaline Phosphatase: 68 U/L (ref 39–117)
Anion gap: 9 (ref 5–15)
BUN: 26 mg/dL — ABNORMAL HIGH (ref 6–23)
CALCIUM: 8.9 mg/dL (ref 8.4–10.5)
CO2: 25 meq/L (ref 19–32)
CREATININE: 0.67 mg/dL (ref 0.50–1.35)
Chloride: 104 mEq/L (ref 96–112)
GFR calc non Af Amer: >90 mL/min (ref >90)
GLUCOSE: 106 mg/dL — AB (ref 70–99)
Potassium: 5 mEq/L (ref 3.7–5.3)
Sodium: 138 mEq/L (ref 137–147)
TOTAL PROTEIN: 6 g/dL (ref 6.0–8.3)
Total Bilirubin: 0.9 mg/dL (ref 0.3–1.2)

## 2013-09-08 LAB — CBC WITH DIFFERENTIAL/PLATELET
BASOS PCT: 0 % (ref 0–1)
Basophils Absolute: 0 10*3/uL (ref 0.0–0.1)
EOS ABS: 0 10*3/uL (ref 0.0–0.7)
EOS PCT: 0 % (ref 0–5)
HCT: 31.5 % — ABNORMAL LOW (ref 39.0–52.0)
Hemoglobin: 10.4 g/dL — ABNORMAL LOW (ref 13.0–17.0)
LYMPHS ABS: 0.6 10*3/uL — AB (ref 0.7–4.0)
Lymphocytes Relative: 14 % (ref 12–46)
MCH: 40.6 pg — AB (ref 26.0–34.0)
MCHC: 33 g/dL (ref 30.0–36.0)
MCV: 123 fL — AB (ref 78.0–100.0)
Monocytes Absolute: 0.2 10*3/uL (ref 0.1–1.0)
Monocytes Relative: 5 % (ref 3–12)
Neutro Abs: 3.2 10*3/uL (ref 1.7–7.7)
Neutrophils Relative %: 81 % — ABNORMAL HIGH (ref 43–77)
Platelets: 123 10*3/uL — ABNORMAL LOW (ref 150–400)
RBC: 2.56 MIL/uL — AB (ref 4.22–5.81)
WBC: 4 10*3/uL (ref 4.0–10.5)

## 2013-09-08 LAB — SEDIMENTATION RATE: Sed Rate: 18 mm/hr — ABNORMAL HIGH (ref 0–16)

## 2013-09-08 LAB — PREALBUMIN: PREALBUMIN: 31.2 mg/dL (ref 17.0–34.0)

## 2013-09-08 LAB — C-REACTIVE PROTEIN: CRP: 1.3 mg/dL — AB (ref <0.60)

## 2013-09-08 SURGERY — IRRIGATION AND DEBRIDEMENT WOUND
Anesthesia: General

## 2013-09-08 MED ORDER — SODIUM CHLORIDE 0.9 % IR SOLN
Status: DC | PRN
  Administered 2013-09-08: 1000 mL

## 2013-09-08 MED ORDER — MEPERIDINE HCL 25 MG/ML IJ SOLN
6.2500 mg | INTRAMUSCULAR | Status: DC | PRN
Start: 2013-09-08 — End: 2013-09-08

## 2013-09-08 MED ORDER — SODIUM CHLORIDE 0.9 % IR SOLN
Status: DC | PRN
  Administered 2013-09-08: 14:00:00

## 2013-09-08 MED ORDER — HYDROMORPHONE HCL PF 1 MG/ML IJ SOLN: 0.2500 mg | INTRAMUSCULAR | Status: DC | PRN

## 2013-09-08 MED ORDER — MIDAZOLAM HCL 2 MG/2ML IJ SOLN
INTRAMUSCULAR | Status: AC
  Filled 2013-09-08: qty 2

## 2013-09-08 MED ORDER — LIDOCAINE HCL (CARDIAC) 20 MG/ML IV SOLN
INTRAVENOUS | Status: DC | PRN
  Administered 2013-09-08: 100 mg via INTRAVENOUS

## 2013-09-08 MED ORDER — ARTIFICIAL TEARS OP OINT
TOPICAL_OINTMENT | OPHTHALMIC | Status: AC
  Filled 2013-09-08: qty 3.5

## 2013-09-08 MED ORDER — ARTIFICIAL TEARS OP OINT
TOPICAL_OINTMENT | OPHTHALMIC | Status: DC | PRN
  Administered 2013-09-08: 1 via OPHTHALMIC

## 2013-09-08 MED ORDER — LIDOCAINE HCL (CARDIAC) 20 MG/ML IV SOLN
INTRAVENOUS | Status: AC
  Filled 2013-09-08: qty 5

## 2013-09-08 MED ORDER — PROPOFOL 10 MG/ML IV BOLUS
INTRAVENOUS | Status: AC
  Filled 2013-09-08: qty 20

## 2013-09-08 MED ORDER — NEOSTIGMINE METHYLSULFATE 10 MG/10ML IV SOLN
INTRAVENOUS | Status: AC
  Filled 2013-09-08: qty 1

## 2013-09-08 MED ORDER — PROMETHAZINE HCL 25 MG/ML IJ SOLN: 6.2500 mg | INTRAMUSCULAR | Status: DC | PRN

## 2013-09-08 MED ORDER — ONDANSETRON HCL 4 MG/2ML IJ SOLN
INTRAMUSCULAR | Status: DC | PRN
  Administered 2013-09-08: 4 mg via INTRAVENOUS

## 2013-09-08 MED ORDER — OXYCODONE HCL 5 MG PO TABS: 5.0000 mg | ORAL_TABLET | Freq: Once | ORAL | Status: DC | PRN

## 2013-09-08 MED ORDER — GLYCOPYRROLATE 0.2 MG/ML IJ SOLN
INTRAMUSCULAR | Status: DC | PRN
  Administered 2013-09-08: 0.4 mg via INTRAVENOUS

## 2013-09-08 MED ORDER — NEOSTIGMINE METHYLSULFATE 10 MG/10ML IV SOLN
INTRAVENOUS | Status: DC | PRN
  Administered 2013-09-08: 3 mg via INTRAVENOUS

## 2013-09-08 MED ORDER — ONDANSETRON HCL 4 MG/2ML IJ SOLN
INTRAMUSCULAR | Status: AC
  Filled 2013-09-08: qty 2

## 2013-09-08 MED ORDER — FENTANYL CITRATE 0.05 MG/ML IJ SOLN
INTRAMUSCULAR | Status: AC
  Filled 2013-09-08: qty 5

## 2013-09-08 MED ORDER — ROCURONIUM BROMIDE 100 MG/10ML IV SOLN
INTRAVENOUS | Status: DC | PRN
  Administered 2013-09-08: 30 mg via INTRAVENOUS

## 2013-09-08 MED ORDER — PROPOFOL 10 MG/ML IV BOLUS
INTRAVENOUS | Status: DC | PRN
Start: 2013-09-08 — End: 2013-09-08
  Administered 2013-09-08: 130 mg via INTRAVENOUS

## 2013-09-08 MED ORDER — FENTANYL CITRATE 0.05 MG/ML IJ SOLN
INTRAMUSCULAR | Status: DC | PRN
  Administered 2013-09-08: 50 ug via INTRAVENOUS
  Administered 2013-09-08: 150 ug via INTRAVENOUS

## 2013-09-08 MED ORDER — GLYCOPYRROLATE 0.2 MG/ML IJ SOLN
INTRAMUSCULAR | Status: AC
  Filled 2013-09-08: qty 2

## 2013-09-08 MED ORDER — LACTATED RINGERS IV SOLN
INTRAVENOUS | Status: DC | PRN
  Administered 2013-09-08: 13:00:00 via INTRAVENOUS

## 2013-09-08 MED ORDER — MIDAZOLAM HCL 5 MG/5ML IJ SOLN
INTRAMUSCULAR | Status: DC | PRN
  Administered 2013-09-08: 2 mg via INTRAVENOUS

## 2013-09-08 MED ORDER — OXYCODONE HCL 5 MG/5ML PO SOLN: 5.0000 mg | Freq: Once | ORAL | Status: DC | PRN

## 2013-09-08 SURGICAL SUPPLY — 39 items
BAG DECANTER FOR FLEXI CONT (MISCELLANEOUS) IMPLANT
BLADE 10 SAFETY STRL DISP (BLADE) ×2 IMPLANT
BLADE SURG ROTATE 9660 (MISCELLANEOUS) IMPLANT
BNDG GAUZE ELAST 4 BULKY (GAUZE/BANDAGES/DRESSINGS) IMPLANT
CANISTER SUCTION 2500CC (MISCELLANEOUS) ×2 IMPLANT
CHLORAPREP W/TINT 26ML (MISCELLANEOUS) IMPLANT
CONT SPEC STER OR (MISCELLANEOUS) IMPLANT
COVER SURGICAL LIGHT HANDLE (MISCELLANEOUS) ×2 IMPLANT
DRAPE INCISE IOBAN 66X45 STRL (DRAPES) IMPLANT
DRAPE PED LAPAROTOMY (DRAPES) ×2 IMPLANT
DRAPE PROXIMA HALF (DRAPES) IMPLANT
DRESSING ALLEVYN LIFE SACRUM (GAUZE/BANDAGES/DRESSINGS) ×1 IMPLANT
DRSG ADAPTIC 3X8 NADH LF (GAUZE/BANDAGES/DRESSINGS) IMPLANT
DRSG PAD ABDOMINAL 8X10 ST (GAUZE/BANDAGES/DRESSINGS) ×2 IMPLANT
DRSG VAC ATS LRG SENSATRAC (GAUZE/BANDAGES/DRESSINGS) IMPLANT
DRSG VAC ATS MED SENSATRAC (GAUZE/BANDAGES/DRESSINGS) IMPLANT
DRSG VAC ATS SM SENSATRAC (GAUZE/BANDAGES/DRESSINGS) IMPLANT
ELECT CAUTERY BLADE 6.4 (BLADE) ×2 IMPLANT
ELECT REM PT RETURN 9FT ADLT (ELECTROSURGICAL) ×2
ELECTRODE REM PT RTRN 9FT ADLT (ELECTROSURGICAL) ×1 IMPLANT
GAUZE SPONGE 4X4 12PLY STRL (GAUZE/BANDAGES/DRESSINGS) ×2 IMPLANT
GLOVE BIO SURGEON STRL SZ 6.5 (GLOVE) ×2 IMPLANT
GOWN STRL REUS W/ TWL LRG LVL3 (GOWN DISPOSABLE) ×2 IMPLANT
GOWN STRL REUS W/TWL LRG LVL3 (GOWN DISPOSABLE) ×4
KIT BASIN OR (CUSTOM PROCEDURE TRAY) ×2 IMPLANT
KIT ROOM TURNOVER OR (KITS) ×2 IMPLANT
MATRIX SURGICAL PSMX 7X10CM (Tissue) ×1 IMPLANT
MICROMATRIX 1000MG (Tissue) ×2 IMPLANT
NS IRRIG 1000ML POUR BTL (IV SOLUTION) ×2 IMPLANT
PACK GENERAL/GYN (CUSTOM PROCEDURE TRAY) ×2 IMPLANT
PAD ARMBOARD 7.5X6 YLW CONV (MISCELLANEOUS) ×4 IMPLANT
SOLUTION PARTIC MCRMTRX 1000MG (Tissue) IMPLANT
SURGILUBE 2OZ TUBE FLIPTOP (MISCELLANEOUS) IMPLANT
SUT VIC AB 5-0 PS2 18 (SUTURE) IMPLANT
SWAB COLLECTION DEVICE MRSA (MISCELLANEOUS) IMPLANT
TOWEL OR 17X24 6PK STRL BLUE (TOWEL DISPOSABLE) ×2 IMPLANT
TOWEL OR 17X26 10 PK STRL BLUE (TOWEL DISPOSABLE) ×2 IMPLANT
TUBE ANAEROBIC SPECIMEN COL (MISCELLANEOUS) IMPLANT
UNDERPAD 30X30 INCONTINENT (UNDERPADS AND DIAPERS) ×2 IMPLANT

## 2013-09-08 NOTE — Transfer of Care (Signed)
Immediate Anesthesia Transfer of Care Note  Patient: [name] [name]  Procedure(s) Performed: Procedure(s): IRRIGATION AND DEBRIDEMENT OF SACRAL ULCER WITH PLACEMENT OF A CELL AND VAC (N/A)  Patient Location: PACU  Anesthesia Type:General  Level of Consciousness: awake, alert , oriented and patient cooperative  Airway & Oxygen Therapy: Patient Spontanous Breathing and Patient connected to nasal cannula oxygen  Post-op Assessment: Report given to PACU RN and Post -op Vital signs reviewed and stable  Post vital signs: Reviewed  Complications: No apparent anesthesia complications

## 2013-09-08 NOTE — Anesthesia Postprocedure Evaluation (Signed)
Anesthesia Post Note  Patient: [name] [name]  Procedure(s) Performed: Procedure(s) (LRB): IRRIGATION AND DEBRIDEMENT OF SACRAL ULCER WITH PLACEMENT OF A CELL AND VAC (N/A)  Anesthesia type: general  Patient location: PACU  Post pain: Pain level controlled  Post assessment: Patient's Cardiovascular Status Stable  Last Vitals:  Filed Vitals:   09/08/13 1515  BP: 147/94  Pulse: 56  Temp:   Resp: 20    Post vital signs: Reviewed and stable  Level of consciousness: sedated  Complications: No apparent anesthesia complications

## 2013-09-08 NOTE — Brief Op Note (Signed)
08/13/2013 - 09/08/2013  1:32 PM  PATIENT:  [name] [name]  53 y.o. male  PRE-OPERATIVE DIAGNOSIS:  ulcer  POST-OPERATIVE DIAGNOSIS:  ulcer  PROCEDURE:  Procedure(s): IRRIGATION AND DEBRIDEMENT OF SACRAL ULCER WITH PLACEMENT OF A CELL AND VAC (N/A)  SURGEON:  Surgeon(s) and Role:    * Nathanie Ottley Sanger, DO - Primary  PHYSICIAN ASSISTANT: none  ASSISTANTS: none   ANESTHESIA:   general  EBL:     BLOOD ADMINISTERED:none  DRAINS: none   LOCAL MEDICATIONS USED:  NONE  SPECIMEN:  No Specimen  DISPOSITION OF SPECIMEN:  N/A  COUNTS:  YES  TOURNIQUET:  * No tourniquets in log *  DICTATION: .Dragon Dictation  PLAN OF CARE: return to [facility]  PATIENT DISPOSITION:  PACU - hemodynamically stable.   Delay start of Pharmacological VTE agent (>24hrs) due to surgical blood loss or risk of bleeding: no

## 2013-09-08 NOTE — Anesthesia Preprocedure Evaluation (Addendum)
Anesthesia Evaluation  Patient identified by MRN, date of birth, ID band Patient awake    Reviewed: Allergy & Precautions, H&P , NPO status , Patient's Chart, lab work & pertinent test results  History of Anesthesia Complications Negative for: history of anesthetic complications  Airway Mallampati: II TM Distance: >3 FB Neck ROM: Full    Dental  (+) Teeth Intact, Dental Advisory Given   Pulmonary Current Smoker,  breath sounds clear to auscultation        Cardiovascular + Peripheral Vascular Disease     Neuro/Psych negative neurological ROS     GI/Hepatic negative GI ROS, Neg liver ROS, Rectal CA   Endo/Other  negative endocrine ROS  Renal/GU negative Renal ROS     Musculoskeletal   Abdominal   Peds  Hematology  (+) HIV,   Anesthesia Other Findings   Reproductive/Obstetrics negative OB ROS                        Anesthesia Physical Anesthesia Plan  ASA: III  Anesthesia Plan: General   Post-op Pain Management:    Induction: Intravenous  Airway Management Planned: LMA  Additional Equipment:   Intra-op Plan:   Post-operative Plan: Extubation in OR  Informed Consent: I have reviewed the patients History and Physical, chart, labs and discussed the procedure including the risks, benefits and alternatives for the proposed anesthesia with the patient or authorized representative who has indicated his/her understanding and acceptance.     Plan Discussed with: CRNA and Surgeon  Anesthesia Plan Comments:        Anesthesia Quick Evaluation

## 2013-09-08 NOTE — Interval H&P Note (Signed)
History and Physical Interval Note:  09/08/2013 7:15 AM  [name] [name]  has presented today for surgery, with the diagnosis of ulcer  The various methods of treatment have been discussed with the patient and family. After consideration of risks, benefits and other options for treatment, the patient has consented to  Procedure(s): IRRIGATION AND DEBRIDEMENT OF SACRAL ULCER WITH PLACEMENT OF A CELL AND VAC (N/A) as a surgical intervention .  The patient's history has been reviewed, patient examined, no change in status, stable for surgery.  I have reviewed the patient's chart and labs.  Questions were answered to the patient's satisfaction.     SANGER,CLAIRE

## 2013-09-08 NOTE — Op Note (Signed)
Operative Note   DATE OF OPERATION: 09/08/2013  LOCATION: [facility]  SURGICAL DIVISION: Plastic Surgery  PREOPERATIVE DIAGNOSES:  Sacral ulcer  POSTOPERATIVE DIAGNOSES:  same  PROCEDURE:  Preparation of sacral ulcer (7 x 10cm) for Acell placement with debridement of soft tissue (7 x 10 sheet and 1 gm Acell)  SURGEON: Theodoro Kos, DO  ASSISTANT: Shawn Rayburn, PA  ANESTHESIA:  General.   COMPLICATIONS: None.   INDICATIONS FOR PROCEDURE:  The patient, [name] [name] is a 53 y.o. male born on 02/02/1960, is here for treatment of a sacral ulcer. MRN: [id number]  CONSENT:  Informed consent was obtained directly from the patient. Risks, benefits and alternatives were fully discussed. Specific risks including but not limited to bleeding, infection, hematoma, seroma, scarring, pain, infection, contracture, asymmetry, wound healing problems, and need for further surgery were all discussed. The patient did have an ample opportunity to have questions answered to satisfaction.   DESCRIPTION OF PROCEDURE:  The patient was taken to the operating room. SCDs were placed. The patient's operative site was prepped and draped in a sterile fashion. A time out was performed and all information was confirmed to be correct.  General anesthesia was administered.  The patient had sharp debridement of the soft tissue.  The area was irrigated with antibiotic solution.   The Acell powder and sheet were applied.  The sheet was secured with 5-0 Vicryl.  An adaptic was placed with KY gel and a sterile dressing. The patient tolerated the procedure well.  There were no complications. The patient was allowed to wake from anesthesia, extubated and taken to the recovery room in satisfactory condition.

## 2013-09-08 NOTE — Anesthesia Procedure Notes (Signed)
Procedure Name: Intubation Date/Time: 09/08/2013 1:38 PM Performed by: Jenne Campus Pre-anesthesia Checklist: Emergency Drugs available, Patient identified, Suction available, Patient being monitored and Timeout performed Patient Re-evaluated:Patient Re-evaluated prior to inductionOxygen Delivery Method: Circle system utilized Preoxygenation: Pre-oxygenation with 100% oxygen Intubation Type: IV induction Ventilation: Mask ventilation without difficulty Laryngoscope Size: Miller and 2 Grade View: Grade I Tube type: Oral Tube size: 7.5 mm Number of attempts: 1 Airway Equipment and Method: Stylet Placement Confirmation: ETT inserted through vocal cords under direct vision,  positive ETCO2,  CO2 detector and breath sounds checked- equal and bilateral Secured at: 22 cm Tube secured with: Tape Dental Injury: Teeth and Oropharynx as per pre-operative assessment

## 2013-09-09 ENCOUNTER — Encounter (HOSPITAL_COMMUNITY): Payer: Self-pay | Admitting: Plastic Surgery

## 2013-09-12 LAB — BASIC METABOLIC PANEL
Anion gap: 11 (ref 5–15)
BUN: 30 mg/dL — ABNORMAL HIGH (ref 6–23)
CHLORIDE: 102 meq/L (ref 96–112)
CO2: 26 mEq/L (ref 19–32)
CREATININE: 0.88 mg/dL (ref 0.50–1.35)
Calcium: 9.3 mg/dL (ref 8.4–10.5)
GFR calc Af Amer: >90 mL/min (ref >90)
GFR calc non Af Amer: >90 mL/min (ref >90)
Glucose, Bld: 87 mg/dL (ref 70–99)
Potassium: 4.9 mEq/L (ref 3.7–5.3)
SODIUM: 139 meq/L (ref 137–147)

## 2013-09-12 LAB — CBC WITH DIFFERENTIAL/PLATELET
BASOS ABS: 0 10*3/uL (ref 0.0–0.1)
Basophils Relative: 0 % (ref 0–1)
EOS ABS: 0.1 10*3/uL (ref 0.0–0.7)
Eosinophils Relative: 2 % (ref 0–5)
HCT: 33.2 % — ABNORMAL LOW (ref 39.0–52.0)
Hemoglobin: 10.9 g/dL — ABNORMAL LOW (ref 13.0–17.0)
Lymphocytes Relative: 27 % (ref 12–46)
Lymphs Abs: 0.9 10*3/uL (ref 0.7–4.0)
MCH: 39.5 pg — ABNORMAL HIGH (ref 26.0–34.0)
MCHC: 32.8 g/dL (ref 30.0–36.0)
MCV: 120.3 fL — ABNORMAL HIGH (ref 78.0–100.0)
MONO ABS: 0.3 10*3/uL (ref 0.1–1.0)
Monocytes Relative: 9 % (ref 3–12)
NEUTROS PCT: 62 % (ref 43–77)
Neutro Abs: 2.1 10*3/uL (ref 1.7–7.7)
PLATELETS: ADEQUATE 10*3/uL (ref 150–400)
RBC: 2.76 MIL/uL — ABNORMAL LOW (ref 4.22–5.81)
WBC: 3.4 10*3/uL — ABNORMAL LOW (ref 4.0–10.5)

## 2013-09-12 LAB — CD4/CD8 (T-HELPER/T-SUPPRESSOR CELL)
CD4 ABSOLUTE: 170 /uL — AB (ref 500–1900)
CD4%: 19 % — ABNORMAL LOW (ref 30.0–60.0)
CD8 T Cell Abs: 400 /uL (ref 230–1000)
CD8TOX: 44 % — AB (ref 15.0–40.0)
Ratio: 0.42 — ABNORMAL LOW (ref 1.0–3.0)
TOTAL LYMPHOCYTE COUNT: 910 /uL — AB (ref 1000–4000)

## 2013-09-13 LAB — URINALYSIS, ROUTINE W REFLEX MICROSCOPIC
Bilirubin Urine: NEGATIVE
GLUCOSE, UA: NEGATIVE mg/dL
HGB URINE DIPSTICK: NEGATIVE
KETONES UR: NEGATIVE mg/dL
Nitrite: NEGATIVE
PROTEIN: 30 mg/dL — AB
Specific Gravity, Urine: 1.024 (ref 1.005–1.030)
Urobilinogen, UA: 1 mg/dL (ref 0.0–1.0)
pH: 7 (ref 5.0–8.0)

## 2013-09-13 LAB — URINE MICROSCOPIC-ADD ON

## 2013-09-15 LAB — URINALYSIS, ROUTINE W REFLEX MICROSCOPIC
Bilirubin Urine: NEGATIVE
Glucose, UA: NEGATIVE mg/dL
HGB URINE DIPSTICK: NEGATIVE
Ketones, ur: NEGATIVE mg/dL
Nitrite: NEGATIVE
Protein, ur: NEGATIVE mg/dL
SPECIFIC GRAVITY, URINE: 1.024 (ref 1.005–1.030)
Urobilinogen, UA: 0.2 mg/dL (ref 0.0–1.0)
pH: 6.5 (ref 5.0–8.0)

## 2013-09-15 LAB — URINE MICROSCOPIC-ADD ON

## 2013-09-15 LAB — CBC WITH DIFFERENTIAL/PLATELET
Basophils Absolute: 0 10*3/uL (ref 0.0–0.1)
Basophils Relative: 1 % (ref 0–1)
EOS PCT: 2 % (ref 0–5)
Eosinophils Absolute: 0.1 10*3/uL (ref 0.0–0.7)
HCT: 30 % — ABNORMAL LOW (ref 39.0–52.0)
HEMOGLOBIN: 10 g/dL — AB (ref 13.0–17.0)
LYMPHS ABS: 1.1 10*3/uL (ref 0.7–4.0)
Lymphocytes Relative: 34 % (ref 12–46)
MCH: 40.5 pg — AB (ref 26.0–34.0)
MCHC: 33.3 g/dL (ref 30.0–36.0)
MCV: 121.5 fL — AB (ref 78.0–100.0)
MONO ABS: 0.4 10*3/uL (ref 0.1–1.0)
Monocytes Relative: 11 % (ref 3–12)
Neutro Abs: 1.6 10*3/uL — ABNORMAL LOW (ref 1.7–7.7)
Neutrophils Relative %: 52 % (ref 43–77)
Platelets: 161 10*3/uL (ref 150–400)
RBC: 2.47 MIL/uL — AB (ref 4.22–5.81)
WBC: 3.2 10*3/uL — ABNORMAL LOW (ref 4.0–10.5)

## 2013-09-15 LAB — COMPREHENSIVE METABOLIC PANEL
ALT: 29 U/L (ref 0–53)
AST: 22 U/L (ref 0–37)
Albumin: 2.9 g/dL — ABNORMAL LOW (ref 3.5–5.2)
Alkaline Phosphatase: 80 U/L (ref 39–117)
Anion gap: 10 (ref 5–15)
BILIRUBIN TOTAL: 1.2 mg/dL (ref 0.3–1.2)
BUN: 31 mg/dL — ABNORMAL HIGH (ref 6–23)
CALCIUM: 9.1 mg/dL (ref 8.4–10.5)
CO2: 25 meq/L (ref 19–32)
Chloride: 103 mEq/L (ref 96–112)
Creatinine, Ser: 0.86 mg/dL (ref 0.50–1.35)
GFR calc non Af Amer: >90 mL/min (ref >90)
Glucose, Bld: 96 mg/dL (ref 70–99)
Potassium: 4.8 mEq/L (ref 3.7–5.3)
Sodium: 138 mEq/L (ref 137–147)
Total Protein: 6.8 g/dL (ref 6.0–8.3)

## 2013-09-15 LAB — C-REACTIVE PROTEIN: CRP: 6.5 mg/dL — ABNORMAL HIGH (ref <0.60)

## 2013-09-15 LAB — PREALBUMIN: Prealbumin: 15.7 mg/dL — ABNORMAL LOW (ref 17.0–34.0)

## 2013-09-15 LAB — SEDIMENTATION RATE: SED RATE: 110 mm/h — AB (ref 0–16)

## 2013-09-16 ENCOUNTER — Other Ambulatory Visit: Payer: Self-pay | Admitting: Plastic Surgery

## 2013-09-16 DIAGNOSIS — L89154 Pressure ulcer of sacral region, stage 4: Secondary | ICD-10-CM

## 2013-09-16 LAB — CBC WITH DIFFERENTIAL/PLATELET
BASOS PCT: 1 % (ref 0–1)
Basophils Absolute: 0 10*3/uL (ref 0.0–0.1)
Eosinophils Absolute: 0.1 10*3/uL (ref 0.0–0.7)
Eosinophils Relative: 2 % (ref 0–5)
HEMATOCRIT: 31.1 % — AB (ref 39.0–52.0)
HEMOGLOBIN: 10.6 g/dL — AB (ref 13.0–17.0)
Lymphocytes Relative: 33 % (ref 12–46)
Lymphs Abs: 1.2 10*3/uL (ref 0.7–4.0)
MCH: 40.2 pg — AB (ref 26.0–34.0)
MCHC: 34.1 g/dL (ref 30.0–36.0)
MCV: 117.8 fL — AB (ref 78.0–100.0)
MONO ABS: 0.4 10*3/uL (ref 0.1–1.0)
Monocytes Relative: 10 % (ref 3–12)
NEUTROS ABS: 2 10*3/uL (ref 1.7–7.7)
NEUTROS PCT: 54 % (ref 43–77)
Platelets: 161 10*3/uL (ref 150–400)
RBC: 2.64 MIL/uL — ABNORMAL LOW (ref 4.22–5.81)
WBC: 3.7 10*3/uL — ABNORMAL LOW (ref 4.0–10.5)

## 2013-09-16 LAB — PROCALCITONIN: Procalcitonin: <0.1 ng/mL

## 2013-09-17 MED ORDER — CEFAZOLIN SODIUM-DEXTROSE 2-3 GM-% IV SOLR
2.0000 g | INTRAVENOUS | Status: AC
  Administered 2013-09-18: 2 g via INTRAVENOUS

## 2013-09-17 NOTE — Progress Notes (Signed)
Spoke with Somun, nurse for [name] [name] at [facility]. She verified medical hx and allergies. Gave her pre-op instructions. She states we normally call for pt when we're ready for him to come to Short Stay.

## 2013-09-18 ENCOUNTER — Encounter (HOSPITAL_COMMUNITY): Payer: Self-pay | Admitting: Certified Registered Nurse Anesthetist

## 2013-09-18 ENCOUNTER — Encounter
Admission: AD | Disposition: A | Discharge status: Skilled Nursing Facility | Payer: Self-pay | Source: Ambulatory Visit | Attending: Internal Medicine

## 2013-09-18 ENCOUNTER — Encounter: Payer: Self-pay | Admitting: Plastic Surgery

## 2013-09-18 ENCOUNTER — Encounter (HOSPITAL_COMMUNITY): Payer: No Typology Code available for payment source | Admitting: Certified Registered Nurse Anesthetist

## 2013-09-18 ENCOUNTER — Inpatient Hospital Stay (HOSPITAL_COMMUNITY)
Admission: RE | Admit: 2013-09-18 | Payer: No Typology Code available for payment source | Source: Ambulatory Visit | Admitting: Plastic Surgery

## 2013-09-18 HISTORY — PX: INCISION AND DRAINAGE OF WOUND: SHX1803

## 2013-09-18 LAB — BASIC METABOLIC PANEL
ANION GAP: 14 (ref 5–15)
BUN: 22 mg/dL (ref 6–23)
CALCIUM: 9.3 mg/dL (ref 8.4–10.5)
CHLORIDE: 101 meq/L (ref 96–112)
CO2: 22 meq/L (ref 19–32)
Creatinine, Ser: 0.78 mg/dL (ref 0.50–1.35)
GFR calc Af Amer: >90 mL/min (ref >90)
GFR calc non Af Amer: >90 mL/min (ref >90)
Glucose, Bld: 92 mg/dL (ref 70–99)
Potassium: 4.3 mEq/L (ref 3.7–5.3)
SODIUM: 137 meq/L (ref 137–147)

## 2013-09-18 LAB — CBC WITH DIFFERENTIAL/PLATELET
Basophils Absolute: 0 10*3/uL (ref 0.0–0.1)
Basophils Relative: 1 % (ref 0–1)
EOS PCT: 2 % (ref 0–5)
Eosinophils Absolute: 0.1 10*3/uL (ref 0.0–0.7)
HCT: 29.7 % — ABNORMAL LOW (ref 39.0–52.0)
HEMOGLOBIN: 10.3 g/dL — AB (ref 13.0–17.0)
LYMPHS ABS: 1.1 10*3/uL (ref 0.7–4.0)
Lymphocytes Relative: 29 % (ref 12–46)
MCH: 40.4 pg — AB (ref 26.0–34.0)
MCHC: 34.7 g/dL (ref 30.0–36.0)
MCV: 116.5 fL — AB (ref 78.0–100.0)
MONOS PCT: 11 % (ref 3–12)
Monocytes Absolute: 0.4 10*3/uL (ref 0.1–1.0)
NEUTROS PCT: 57 % (ref 43–77)
Neutro Abs: 2.1 10*3/uL (ref 1.7–7.7)
PLATELETS: 185 10*3/uL (ref 150–400)
RBC: 2.55 MIL/uL — AB (ref 4.22–5.81)
WBC: 3.7 10*3/uL — AB (ref 4.0–10.5)

## 2013-09-18 LAB — URINE CULTURE

## 2013-09-18 SURGERY — IRRIGATION AND DEBRIDEMENT WOUND
Anesthesia: General | Site: Back

## 2013-09-18 MED ORDER — SODIUM CHLORIDE 0.9 % IR SOLN: Status: DC | PRN

## 2013-09-18 MED ORDER — MIDAZOLAM HCL 2 MG/2ML IJ SOLN
INTRAMUSCULAR | Status: AC
  Filled 2013-09-18: qty 2

## 2013-09-18 MED ORDER — SUCCINYLCHOLINE CHLORIDE 20 MG/ML IJ SOLN: INTRAMUSCULAR | Status: DC | PRN

## 2013-09-18 MED ORDER — ROCURONIUM BROMIDE 50 MG/5ML IV SOLN
INTRAVENOUS | Status: AC
  Filled 2013-09-18: qty 1

## 2013-09-18 MED ORDER — LACTATED RINGERS IV SOLN
INTRAVENOUS | Status: DC
  Administered 2013-09-18: 12:00:00 via INTRAVENOUS

## 2013-09-18 MED ORDER — PROPOFOL 10 MG/ML IV BOLUS
INTRAVENOUS | Status: DC | PRN
  Administered 2013-09-18: 200 mg via INTRAVENOUS

## 2013-09-18 MED ORDER — ROCURONIUM BROMIDE 50 MG/5ML IV SOLN
INTRAVENOUS | Status: AC
  Filled 2013-09-18: qty 3

## 2013-09-18 MED ORDER — ARTIFICIAL TEARS OP OINT
TOPICAL_OINTMENT | OPHTHALMIC | Status: AC
  Filled 2013-09-18: qty 3.5

## 2013-09-18 MED ORDER — HYDROMORPHONE HCL PF 1 MG/ML IJ SOLN: 0.2500 mg | INTRAMUSCULAR | Status: DC | PRN

## 2013-09-18 MED ORDER — MIDAZOLAM HCL 5 MG/5ML IJ SOLN
INTRAMUSCULAR | Status: DC | PRN
  Administered 2013-09-18: 2 mg via INTRAVENOUS

## 2013-09-18 MED ORDER — ONDANSETRON HCL 4 MG/2ML IJ SOLN
INTRAMUSCULAR | Status: AC
  Filled 2013-09-18: qty 2

## 2013-09-18 MED ORDER — SUCCINYLCHOLINE CHLORIDE 20 MG/ML IJ SOLN
INTRAMUSCULAR | Status: DC | PRN
  Administered 2013-09-18: 60 mg via INTRAVENOUS

## 2013-09-18 MED ORDER — LACTATED RINGERS IV SOLN
INTRAVENOUS | Status: DC | PRN
  Administered 2013-09-18: 13:00:00 via INTRAVENOUS

## 2013-09-18 MED ORDER — LIDOCAINE HCL (CARDIAC) 20 MG/ML IV SOLN
INTRAVENOUS | Status: DC | PRN
  Administered 2013-09-18: 80 mg via INTRAVENOUS

## 2013-09-18 MED ORDER — FENTANYL CITRATE 0.05 MG/ML IJ SOLN
INTRAMUSCULAR | Status: AC
  Filled 2013-09-18: qty 5

## 2013-09-18 MED ORDER — LIDOCAINE HCL (CARDIAC) 20 MG/ML IV SOLN
INTRAVENOUS | Status: AC
  Filled 2013-09-18: qty 5

## 2013-09-18 MED ORDER — ONDANSETRON HCL 4 MG/2ML IJ SOLN
INTRAMUSCULAR | Status: DC | PRN
  Administered 2013-09-18: 4 mg via INTRAVENOUS

## 2013-09-18 MED ORDER — PROPOFOL 10 MG/ML IV BOLUS
INTRAVENOUS | Status: AC
  Filled 2013-09-18: qty 20

## 2013-09-18 MED ORDER — FENTANYL CITRATE 0.05 MG/ML IJ SOLN
INTRAMUSCULAR | Status: DC | PRN
  Administered 2013-09-18: 150 ug via INTRAVENOUS
  Administered 2013-09-18: 100 ug via INTRAVENOUS

## 2013-09-18 SURGICAL SUPPLY — 34 items
CANISTER SUCTION 2500CC (MISCELLANEOUS) ×3 IMPLANT
COVER SURGICAL LIGHT HANDLE (MISCELLANEOUS) ×3 IMPLANT
DRAPE INCISE IOBAN 66X45 STRL (DRAPES) IMPLANT
DRAPE PED LAPAROTOMY (DRAPES) ×3 IMPLANT
DRAPE PROXIMA HALF (DRAPES) IMPLANT
DRESSING ALLEVYN LIFE SACRUM (GAUZE/BANDAGES/DRESSINGS) ×2 IMPLANT
DRSG ADAPTIC 3X8 NADH LF (GAUZE/BANDAGES/DRESSINGS) ×2 IMPLANT
DRSG VERSA FOAM LRG 10X15 (GAUZE/BANDAGES/DRESSINGS) IMPLANT
ELECT REM PT RETURN 9FT ADLT (ELECTROSURGICAL) ×3
ELECTRODE REM PT RTRN 9FT ADLT (ELECTROSURGICAL) ×1 IMPLANT
GLOVE BIO SURGEON STRL SZ 6.5 (GLOVE) ×2 IMPLANT
GLOVE BIO SURGEONS STRL SZ 6.5 (GLOVE) ×1
GLOVE BIOGEL PI IND STRL 6.5 (GLOVE) IMPLANT
GLOVE BIOGEL PI IND STRL 7.0 (GLOVE) IMPLANT
GLOVE BIOGEL PI INDICATOR 6.5 (GLOVE) ×4
GLOVE BIOGEL PI INDICATOR 7.0 (GLOVE) ×2
GLOVE ECLIPSE 6.5 STRL STRAW (GLOVE) ×2 IMPLANT
GLOVE ECLIPSE 7.0 STRL STRAW (GLOVE) ×2 IMPLANT
GOWN STRL REUS W/ TWL LRG LVL3 (GOWN DISPOSABLE) ×2 IMPLANT
GOWN STRL REUS W/TWL LRG LVL3 (GOWN DISPOSABLE) ×9
KIT BASIN OR (CUSTOM PROCEDURE TRAY) ×3 IMPLANT
KIT ROOM TURNOVER OR (KITS) ×3 IMPLANT
MATRIX SURGICAL PSMX 7X10CM (Tissue) ×2 IMPLANT
MICROMATRIX 1000MG (Tissue) ×3 IMPLANT
NS IRRIG 1000ML POUR BTL (IV SOLUTION) ×3 IMPLANT
PACK GENERAL/GYN (CUSTOM PROCEDURE TRAY) ×3 IMPLANT
PAD ARMBOARD 7.5X6 YLW CONV (MISCELLANEOUS) ×6 IMPLANT
SOLUTION PARTIC MCRMTRX 1000MG (Tissue) IMPLANT
SURGILUBE 2OZ TUBE FLIPTOP (MISCELLANEOUS) ×2 IMPLANT
SUT VIC AB 5-0 PS2 18 (SUTURE) ×4 IMPLANT
SWAB COLLECTION DEVICE MRSA (MISCELLANEOUS) ×2 IMPLANT
TOWEL OR 17X24 6PK STRL BLUE (TOWEL DISPOSABLE) ×3 IMPLANT
TOWEL OR 17X26 10 PK STRL BLUE (TOWEL DISPOSABLE) ×3 IMPLANT
TUBE ANAEROBIC SPECIMEN COL (MISCELLANEOUS) ×2 IMPLANT

## 2013-09-18 NOTE — Op Note (Addendum)
Operative Note   DATE OF OPERATION: 09/18/2013  LOCATION: [facility]  SURGICAL DIVISION: Plastic Surgery  PREOPERATIVE DIAGNOSES:  Sacral ulcer  POSTOPERATIVE DIAGNOSES:  same  PROCEDURE:  Preparation of sacral ulcer (67 x 9.5cm) for Acell placement with debridement of soft tissue (7 x 10 sheet and 1 gm Acell)  SURGEON: Theodoro Kos, DO  ASSISTANT: Shawn Rayburn, PA  ANESTHESIA:  General.   COMPLICATIONS: None.   INDICATIONS FOR PROCEDURE:  The patient, [name] [name] is a 53 y.o. male born on 19-Oct-1960, is here for treatment of a sacral ulcer. MRN: [id number]  CONSENT:  Informed consent was obtained directly from the patient. Risks, benefits and alternatives were fully discussed. Specific risks including but not limited to bleeding, infection, hematoma, seroma, scarring, pain, infection, contracture, asymmetry, wound healing problems, and need for further surgery were all discussed. The patient did have an ample opportunity to have questions answered to satisfaction.   DESCRIPTION OF PROCEDURE:  The patient was taken to the operating room. SCDs were placed. The patient's operative site was prepped and draped in a sterile fashion. A time out was performed and all information was confirmed to be correct.  General anesthesia was administered.  The patient had sharp debridement of the soft tissue.  The area was irrigated with antibiotic solution.   The Acell powder and sheet were applied.  The sheet was secured with 5-0 Vicryl.  An adaptic was placed with KY gel and a sterile dressing. A piece of bone was sent for cultures. This was obtained with a ronguer. The patient tolerated the procedure well.  There were no complications. The patient was allowed to wake from anesthesia, extubated and taken to the recovery room in satisfactory condition.

## 2013-09-18 NOTE — Transfer of Care (Signed)
Immediate Anesthesia Transfer of Care Note  Patient: [name] [name]  Procedure(s) Performed: Procedure(s): IRRIGATION AND DEBRIDEMENT OF SACRAL WOUND WITH PLACEMENT OF A-CELL (N/A)  Patient Location: PACU  Anesthesia Type:General  Level of Consciousness: awake, alert  and oriented  Airway & Oxygen Therapy: Patient Spontanous Breathing and Patient connected to nasal cannula oxygen  Post-op Assessment: Report given to PACU RN, Post -op Vital signs reviewed and stable and Patient moving all extremities  Post vital signs: Reviewed and stable  Complications: No apparent anesthesia complications

## 2013-09-18 NOTE — Brief Op Note (Signed)
08/13/2013 - 09/18/2013  12:59 PM  PATIENT:  [name] [name]  53 y.o. male  PRE-OPERATIVE DIAGNOSIS:  SACRAL ULCER  POST-OPERATIVE DIAGNOSIS:  Sacral ulcer  PROCEDURE:  Procedure(s): IRRIGATION AND DEBRIDEMENT OF SACRAL WOUND WITH PLACEMENT OF A-CELL/VAC (N/A)  SURGEON:  Surgeon(s) and Role:    * Claire Sanger, DO - Primary  PHYSICIAN ASSISTANT: Shawn Rayburn, PA  ASSISTANTS: none   ANESTHESIA:   general  EBL:     BLOOD ADMINISTERED:none  DRAINS: none   LOCAL MEDICATIONS USED:  NONE  SPECIMEN:  Source of Specimen:  sacral tissue  DISPOSITION OF SPECIMEN:  microbiology  COUNTS:  YES  TOURNIQUET:  * No tourniquets in log *  DICTATION: .Dragon Dictation  PLAN OF CARE: return to [facility]  PATIENT DISPOSITION:  PACU - hemodynamically stable.   Delay start of Pharmacological VTE agent (>24hrs) due to surgical blood loss or risk of bleeding: no

## 2013-09-18 NOTE — Anesthesia Postprocedure Evaluation (Signed)
  Anesthesia Post-op Note  Patient: [name] [name]  Procedure(s) Performed: Procedure(s): IRRIGATION AND DEBRIDEMENT OF SACRAL WOUND WITH PLACEMENT OF A-CELL (N/A)  Patient Location: PACU  Anesthesia Type:General  Level of Consciousness: awake and alert   Airway and Oxygen Therapy: Patient Spontanous Breathing  Post-op Pain: mild  Post-op Assessment: Post-op Vital signs reviewed, Patient's Cardiovascular Status Stable, Respiratory Function Stable, Patent Airway, No signs of Nausea or vomiting and Pain level controlled  Post-op Vital Signs: Reviewed and stable  Last Vitals:  Filed Vitals:   09/18/13 1445  BP: 103/68  Pulse:   Temp: 36.6 C  Resp:     Complications: No apparent anesthesia complications

## 2013-09-18 NOTE — Anesthesia Preprocedure Evaluation (Addendum)
Anesthesia Evaluation  Patient identified by MRN, date of birth, ID band Patient awake    Reviewed: Allergy & Precautions, H&P , NPO status , Patient's Chart, lab work & pertinent test results  History of Anesthesia Complications Negative for: history of anesthetic complications  Airway Mallampati: I TM Distance: >3 FB Neck ROM: Full    Dental  (+) Teeth Intact, Dental Advisory Given, Poor Dentition   Pulmonary Current Smoker,  breath sounds clear to auscultation        Cardiovascular + Peripheral Vascular Disease Rhythm:Regular Rate:Normal     Neuro/Psych negative neurological ROS  negative psych ROS   GI/Hepatic negative GI ROS, Neg liver ROS,   Endo/Other  negative endocrine ROS  Renal/GU negative Renal ROS     Musculoskeletal negative musculoskeletal ROS (+)   Abdominal   Peds  Hematology  (+) anemia , HIV,   Anesthesia Other Findings   Reproductive/Obstetrics negative OB ROS                          Anesthesia Physical Anesthesia Plan  ASA: III  Anesthesia Plan: General   Post-op Pain Management:    Induction: Intravenous  Airway Management Planned: Oral ETT  Additional Equipment: None  Intra-op Plan:   Post-operative Plan: Extubation in OR  Informed Consent: I have reviewed the patients History and Physical, chart, labs and discussed the procedure including the risks, benefits and alternatives for the proposed anesthesia with the patient or authorized representative who has indicated his/her understanding and acceptance.   Dental advisory given  Plan Discussed with: CRNA and Surgeon  Anesthesia Plan Comments:        Anesthesia Quick Evaluation

## 2013-09-18 NOTE — Interval H&P Note (Signed)
History and Physical Interval Note:  09/18/2013 7:29 AM  [name] [name]  has presented today for surgery, with the diagnosis of SACRAL ULCER  The various methods of treatment have been discussed with the patient and family. After consideration of risks, benefits and other options for treatment, the patient has consented to  Procedure(s): IRRIGATION AND DEBRIDEMENT OF SACRAL WOUND WITH PLACEMENT OF A-CELL/VAC (N/A) as a surgical intervention .  The patient's history has been reviewed, patient examined, no change in status, stable for surgery.  I have reviewed the patient's chart and labs.  Questions were answered to the patient's satisfaction.     SANGER,CLAIRE

## 2013-09-19 ENCOUNTER — Other Ambulatory Visit: Payer: Self-pay | Admitting: Plastic Surgery

## 2013-09-19 DIAGNOSIS — L89154 Pressure ulcer of sacral region, stage 4: Secondary | ICD-10-CM

## 2013-09-21 MED ORDER — CEFAZOLIN SODIUM-DEXTROSE 2-3 GM-% IV SOLR: 2.0000 g | INTRAVENOUS | Status: AC

## 2013-09-22 ENCOUNTER — Encounter (HOSPITAL_COMMUNITY): Payer: Self-pay | Admitting: Certified Registered"

## 2013-09-22 ENCOUNTER — Encounter
Admission: AD | Disposition: A | Discharge status: Skilled Nursing Facility | Payer: Self-pay | Source: Ambulatory Visit | Attending: Internal Medicine

## 2013-09-22 ENCOUNTER — Encounter: Payer: Self-pay | Admitting: Plastic Surgery

## 2013-09-22 LAB — COMPREHENSIVE METABOLIC PANEL
ALT: 19 U/L (ref 0–53)
AST: 19 U/L (ref 0–37)
Albumin: 3.4 g/dL — ABNORMAL LOW (ref 3.5–5.2)
Alkaline Phosphatase: 112 U/L (ref 39–117)
Anion gap: 13 (ref 5–15)
BUN: 22 mg/dL (ref 6–23)
CALCIUM: 9.8 mg/dL (ref 8.4–10.5)
CO2: 25 mEq/L (ref 19–32)
CREATININE: 0.82 mg/dL (ref 0.50–1.35)
Chloride: 99 mEq/L (ref 96–112)
Glucose, Bld: 86 mg/dL (ref 70–99)
Potassium: 4.1 mEq/L (ref 3.7–5.3)
SODIUM: 137 meq/L (ref 137–147)
TOTAL PROTEIN: 7.9 g/dL (ref 6.0–8.3)
Total Bilirubin: 1.2 mg/dL (ref 0.3–1.2)

## 2013-09-22 LAB — CBC WITH DIFFERENTIAL/PLATELET
Basophils Absolute: 0.1 10*3/uL (ref 0.0–0.1)
Basophils Relative: 1 % (ref 0–1)
EOS ABS: 0.1 10*3/uL (ref 0.0–0.7)
EOS PCT: 2 % (ref 0–5)
HCT: 30.4 % — ABNORMAL LOW (ref 39.0–52.0)
Hemoglobin: 10.3 g/dL — ABNORMAL LOW (ref 13.0–17.0)
LYMPHS ABS: 1.6 10*3/uL (ref 0.7–4.0)
Lymphocytes Relative: 31 % (ref 12–46)
MCH: 39.5 pg — ABNORMAL HIGH (ref 26.0–34.0)
MCHC: 33.9 g/dL (ref 30.0–36.0)
MCV: 116.5 fL — AB (ref 78.0–100.0)
MONO ABS: 0.5 10*3/uL (ref 0.1–1.0)
Monocytes Relative: 10 % (ref 3–12)
NEUTROS PCT: 56 % (ref 43–77)
Neutro Abs: 2.7 10*3/uL (ref 1.7–7.7)
PLATELETS: 288 10*3/uL (ref 150–400)
RBC: 2.61 MIL/uL — AB (ref 4.22–5.81)
RDW: 21.4 % — AB (ref 11.5–15.5)
WBC: 5 10*3/uL (ref 4.0–10.5)

## 2013-09-22 LAB — PREALBUMIN: PREALBUMIN: 17.2 mg/dL — AB (ref 17.0–34.0)

## 2013-09-22 LAB — SEDIMENTATION RATE: SED RATE: 125 mm/h — AB (ref 0–16)

## 2013-09-22 LAB — PHOSPHORUS: PHOSPHORUS: 3.2 mg/dL (ref 2.3–4.6)

## 2013-09-22 LAB — C-REACTIVE PROTEIN: CRP: 3.3 mg/dL — ABNORMAL HIGH (ref <0.60)

## 2013-09-22 LAB — MAGNESIUM: Magnesium: 2 mg/dL (ref 1.5–2.5)

## 2013-09-22 SURGERY — IRRIGATION AND DEBRIDEMENT WOUND
Anesthesia: General

## 2013-09-22 MED ORDER — MIDAZOLAM HCL 2 MG/2ML IJ SOLN
INTRAMUSCULAR | Status: AC
  Filled 2013-09-22: qty 2

## 2013-09-22 MED ORDER — SUCCINYLCHOLINE CHLORIDE 20 MG/ML IJ SOLN
INTRAMUSCULAR | Status: AC
  Filled 2013-09-22: qty 1

## 2013-09-22 MED ORDER — ROCURONIUM BROMIDE 50 MG/5ML IV SOLN
INTRAVENOUS | Status: AC
  Filled 2013-09-22: qty 1

## 2013-09-22 MED ORDER — FENTANYL CITRATE 0.05 MG/ML IJ SOLN
INTRAMUSCULAR | Status: AC
  Filled 2013-09-22: qty 5

## 2013-09-22 MED ORDER — PROPOFOL 10 MG/ML IV BOLUS
INTRAVENOUS | Status: AC
  Filled 2013-09-22: qty 20

## 2013-09-22 SURGICAL SUPPLY — 36 items
BAG DECANTER FOR FLEXI CONT (MISCELLANEOUS) IMPLANT
BLADE 10 SAFETY STRL DISP (BLADE) ×4 IMPLANT
BLADE SURG ROTATE 9660 (MISCELLANEOUS) IMPLANT
BNDG GAUZE ELAST 4 BULKY (GAUZE/BANDAGES/DRESSINGS) IMPLANT
CANISTER SUCTION 2500CC (MISCELLANEOUS) ×4 IMPLANT
CHLORAPREP W/TINT 26ML (MISCELLANEOUS) IMPLANT
CONT SPEC STER OR (MISCELLANEOUS) IMPLANT
COVER SURGICAL LIGHT HANDLE (MISCELLANEOUS) ×4 IMPLANT
DRAPE INCISE IOBAN 66X45 STRL (DRAPES) IMPLANT
DRAPE PED LAPAROTOMY (DRAPES) ×4 IMPLANT
DRAPE PROXIMA HALF (DRAPES) IMPLANT
DRSG ADAPTIC 3X8 NADH LF (GAUZE/BANDAGES/DRESSINGS) IMPLANT
DRSG PAD ABDOMINAL 8X10 ST (GAUZE/BANDAGES/DRESSINGS) ×4 IMPLANT
DRSG VAC ATS LRG SENSATRAC (GAUZE/BANDAGES/DRESSINGS) IMPLANT
DRSG VAC ATS MED SENSATRAC (GAUZE/BANDAGES/DRESSINGS) IMPLANT
DRSG VAC ATS SM SENSATRAC (GAUZE/BANDAGES/DRESSINGS) IMPLANT
ELECT CAUTERY BLADE 6.4 (BLADE) ×4 IMPLANT
ELECT REM PT RETURN 9FT ADLT (ELECTROSURGICAL) ×3
ELECTRODE REM PT RTRN 9FT ADLT (ELECTROSURGICAL) ×2 IMPLANT
GAUZE SPONGE 4X4 12PLY STRL (GAUZE/BANDAGES/DRESSINGS) ×4 IMPLANT
GLOVE BIO SURGEON STRL SZ 6.5 (GLOVE) ×3 IMPLANT
GLOVE BIO SURGEONS STRL SZ 6.5 (GLOVE) ×1
GOWN STRL REUS W/ TWL LRG LVL3 (GOWN DISPOSABLE) ×4 IMPLANT
GOWN STRL REUS W/TWL LRG LVL3 (GOWN DISPOSABLE) ×6
KIT BASIN OR (CUSTOM PROCEDURE TRAY) ×4 IMPLANT
KIT ROOM TURNOVER OR (KITS) ×4 IMPLANT
NS IRRIG 1000ML POUR BTL (IV SOLUTION) ×4 IMPLANT
PACK GENERAL/GYN (CUSTOM PROCEDURE TRAY) ×4 IMPLANT
PAD ARMBOARD 7.5X6 YLW CONV (MISCELLANEOUS) ×8 IMPLANT
SURGILUBE 2OZ TUBE FLIPTOP (MISCELLANEOUS) IMPLANT
SUT VIC AB 5-0 PS2 18 (SUTURE) IMPLANT
SWAB COLLECTION DEVICE MRSA (MISCELLANEOUS) IMPLANT
TOWEL OR 17X24 6PK STRL BLUE (TOWEL DISPOSABLE) ×4 IMPLANT
TOWEL OR 17X26 10 PK STRL BLUE (TOWEL DISPOSABLE) ×4 IMPLANT
TUBE ANAEROBIC SPECIMEN COL (MISCELLANEOUS) IMPLANT
UNDERPAD 30X30 INCONTINENT (UNDERPADS AND DIAPERS) ×4 IMPLANT

## 2013-09-22 NOTE — Consult Note (Signed)
Reason for Consult:Sacral ulcer Referring Physician: hospitalist  [name] [name] is an 53 y.o. male.  HPI: The patient is a 53 yrs old wm being treated for a deep sacral ulcer.  He has undergone several debridements and placement of Acell.  We have been encouraging protein intake to improve his nutritional status.  The area is showing improvement in the granulation tissue superiorly but very little and slow improvement in the inferior portion of the wound.  This is concerning.  Past Medical History  Diagnosis Date  . Radiation   . HIV (human immunodeficiency virus infection)   . Shingles     left shoulder, right leg  . Pain in limb 07/23/2013  . Rectal cancer     Squamous cell  . Gout     Past Surgical History  Procedure Laterality Date  . Hernia repair      right  . Hematoma evacuation    . Incision and drainage hip Right 07/31/2013    Procedure: IRRIGATION AND DEBRIDEMENT HIP WITH PERCUTANEOUS DRAIN PLACEMENT;  Surgeon: [name], MD;  Location: [facility];  Service: Orthopedics;  Laterality: Right;  . Total hip arthroplasty Right 08/04/2013    Procedure: RESECTION HIP JOINT - PLACEMENT OF CEMENT PROSTHESIS;  Surgeon: [name], MD;  Location: [facility];  Service: Orthopedics;  Laterality: Right;  . Pilonidal cyst drainage N/A 08/09/2013    Procedure: IRRIGATION AND DEBRIDEMENT SACRAL DECUBITUS;  Surgeon: Harl Bowie, MD;  Location: [place];  Service: General;  Laterality: N/A;  . Incision and drainage of wound N/A 08/25/2013    Procedure: IRRIGATION AND DEBRIDEMENT OF SACRAL ULCER WITH PLACEMENT OF A CELL ;  Surgeon: Theodoro Kos, DO;  Location: [facility];  Service: Plastics;  Laterality: N/A;  . Incision and drainage of wound N/A 09/01/2013    Procedure: IRRIGATION AND DEBRIDEMENT SACRAL ULCER WITH PLACEMENT OF A CELL;  Surgeon: Theodoro Kos, DO;  Location: [facility];  Service: Plastics;  Laterality: N/A;  . Incision and drainage of wound N/A 09/08/2013    Procedure: IRRIGATION AND  DEBRIDEMENT OF SACRAL ULCER WITH PLACEMENT OF A CELL AND VAC;  Surgeon: Theodoro Kos, DO;  Location: [facility];  Service: Plastics;  Laterality: N/A;  . Incision and drainage of wound N/A 09/18/2013    Procedure: IRRIGATION AND DEBRIDEMENT OF SACRAL WOUND WITH PLACEMENT OF A-CELL;  Surgeon: Theodoro Kos, DO;  Location: [facility];  Service: Plastics;  Laterality: N/A;    Family History  Problem Relation Age of Onset  . Diabetes Mother   . Dementia Mother   . Atrial fibrillation Mother   . Hypertension Mother   . Cancer Father   . Emphysema Father     Social History:  reports that he has been smoking Cigarettes.  He has been smoking about 0.30 packs per day. He has never used smokeless tobacco. He reports that he drinks about 1.5 ounces of alcohol per week. He reports that he uses illicit drugs (Marijuana).  Allergies: No Known Allergies  Medications: I have reviewed the patient's current medications.  Results for orders placed during the hospital encounter of 08/13/13 (from the past 48 hour(s))  CBC WITH DIFFERENTIAL     Status: Abnormal   Collection Time    09/22/13  8:06 AM      Result Value Ref Range   WBC 5.0  4.0 - 10.5 K/uL   RBC 2.61 (*) 4.22 - 5.81 MIL/uL   Hemoglobin 10.3 (*) 13.0 - 17.0 g/dL  Comment: CONSISTENT WITH PREVIOUS RESULT   HCT 30.4 (*) 39.0 - 52.0 %   MCV 116.5 (*) 78.0 - 100.0 fL   Comment: CONSISTENT WITH PREVIOUS RESULT   MCH 39.5 (*) 26.0 - 34.0 pg   MCHC 33.9  30.0 - 36.0 g/dL   RDW 21.4 (*) 11.5 - 15.5 %   Platelets 288  150 - 400 K/uL   Neutrophils Relative % 56  43 - 77 %   Lymphocytes Relative 31  12 - 46 %   Monocytes Relative 10  3 - 12 %   Eosinophils Relative 2  0 - 5 %   Basophils Relative 1  0 - 1 %   Neutro Abs 2.7  1.7 - 7.7 K/uL   Lymphs Abs 1.6  0.7 - 4.0 K/uL   Monocytes Absolute 0.5  0.1 - 1.0 K/uL   Eosinophils Absolute 0.1  0.0 - 0.7 K/uL   Basophils Absolute 0.1  0.0 - 0.1 K/uL   RBC Morphology POLYCHROMASIA PRESENT      COMPREHENSIVE METABOLIC PANEL     Status: Abnormal   Collection Time    09/22/13  8:06 AM      Result Value Ref Range   Sodium 137  137 - 147 mEq/L   Potassium 4.1  3.7 - 5.3 mEq/L   Chloride 99  96 - 112 mEq/L   CO2 25  19 - 32 mEq/L   Glucose, Bld 86  70 - 99 mg/dL   BUN 22  6 - 23 mg/dL   Creatinine, Ser 0.82  0.50 - 1.35 mg/dL   Calcium 9.8  8.4 - 10.5 mg/dL   Total Protein 7.9  6.0 - 8.3 g/dL   Albumin 3.4 (*) 3.5 - 5.2 g/dL   AST 19  0 - 37 U/L   ALT 19  0 - 53 U/L   Alkaline Phosphatase 112  39 - 117 U/L   Total Bilirubin 1.2  0.3 - 1.2 mg/dL   GFR calc non Af Amer >90  >90 mL/min   GFR calc Af Amer >90  >90 mL/min   Comment: (NOTE)     The eGFR has been calculated using the CKD EPI equation.     This calculation has not been validated in all clinical situations.     eGFR's persistently <90 mL/min signify possible Chronic Kidney     Disease.   Anion gap 13  5 - 15  MAGNESIUM     Status: None   Collection Time    09/22/13  8:06 AM      Result Value Ref Range   Magnesium 2.0  1.5 - 2.5 mg/dL  PHOSPHORUS     Status: None   Collection Time    09/22/13  8:06 AM      Result Value Ref Range   Phosphorus 3.2  2.3 - 4.6 mg/dL  C-REACTIVE PROTEIN     Status: Abnormal   Collection Time    09/22/13  8:06 AM      Result Value Ref Range   CRP 3.3 (*) <0.60 mg/dL   Comment: Performed at [facility]     Status: Abnormal   Collection Time    09/22/13  8:06 AM      Result Value Ref Range   Sed Rate 125 (*) 0 - 16 mm/hr    No results found.  Review of Systems  Constitutional: Negative.   HENT: Negative.   Eyes: Negative.   Respiratory: Negative.  Cardiovascular: Negative.   Gastrointestinal: Negative.   Genitourinary: Negative.   Musculoskeletal: Negative.   Psychiatric/Behavioral: Negative.    Blood pressure 103/68, pulse 67, temperature 97.8 F (36.6 C), resp. rate 12, SpO2 100.00%. Physical Exam  Constitutional: He is oriented to  person, place, and time.  HENT:  Head: Normocephalic and atraumatic.  Eyes: Pupils are equal, round, and reactive to light.  Respiratory: Effort normal.  Musculoskeletal:       Back:  Neurological: He is alert and oriented to person, place, and time.  Psychiatric: He has a normal mood and affect. His behavior is normal. Judgment and thought content normal.    Assessment/Plan: I recommend referral as an outpatient to [facility] for a possible muscle flap.  In the mean time, continue with ky gel to the sacral area daily, off load, air mattress, multivitamins, Zinc and Vit C daily.  Ashley 09/22/2013, 3:12 PM

## 2013-09-22 NOTE — Interval H&P Note (Signed)
History and Physical Interval Note:  09/22/2013 7:26 AM  [name] [name]  has presented today for surgery, with the diagnosis of SACRAL ULCER   The various methods of treatment have been discussed with the patient and family. After consideration of risks, benefits and other options for treatment, the patient has consented to  Procedure(s): IRRIGATION AND DEBRIDEMENT OF SACRAL ULCER WITH  (N/A) PLACEMENT  OF A-CELL AND VAC  (N/A) as a surgical intervention .  The patient's history has been reviewed, patient examined, no change in status, stable for surgery.  I have reviewed the patient's chart and labs.  Questions were answered to the patient's satisfaction.     SANGER,CLAIRE

## 2013-09-23 NOTE — Op Note (Signed)
Operative Note   DATE OF OPERATION: 09/23/2013  LOCATION: [facility]  SURGICAL DIVISION: Plastic Surgery  PREOPERATIVE DIAGNOSES:  Sacral ulcer  POSTOPERATIVE DIAGNOSES:  same  PROCEDURE:  Preparation of sacral ulcer (6.7 x 9.5cm) for Acell placement with debridement of soft tissue (7 x 10 sheet and 1 gm Acell)  SURGEON: Theodoro Kos, DO  ASSISTANT: Shawn Rayburn, PA  ANESTHESIA:  General.   COMPLICATIONS: None.   INDICATIONS FOR PROCEDURE:  The patient, [name] [name] is a 53 y.o. male born on 1960/12/17, is here for treatment of a sacral ulcer. MRN: [id number]  CONSENT:  Informed consent was obtained directly from the patient. Risks, benefits and alternatives were fully discussed. Specific risks including but not limited to bleeding, infection, hematoma, seroma, scarring, pain, infection, contracture, asymmetry, wound healing problems, and need for further surgery were all discussed. The patient did have an ample opportunity to have questions answered to satisfaction.   DESCRIPTION OF PROCEDURE:  The patient was taken to the operating room. SCDs were placed. The patient's operative site was prepped and draped in a sterile fashion. A time out was performed and all information was confirmed to be correct.  General anesthesia was administered.  The patient had sharp debridement of the soft tissue.  The area was irrigated with antibiotic solution.   The Acell powder and sheet were applied.  The sheet was secured with 5-0 Vicryl.  An adaptic was placed with KY gel and a sterile dressing. A piece of bone was sent for cultures. This was obtained with a ronguer. The patient tolerated the procedure well.  There were no complications. The patient was allowed to wake from anesthesia, extubated and taken to the recovery room in satisfactory condition.

## 2013-09-25 LAB — CBC WITH DIFFERENTIAL/PLATELET
BASOS ABS: 0 10*3/uL (ref 0.0–0.1)
Basophils Relative: 0 % (ref 0–1)
Eosinophils Absolute: 0.1 10*3/uL (ref 0.0–0.7)
Eosinophils Relative: 1 % (ref 0–5)
HEMATOCRIT: 31.3 % — AB (ref 39.0–52.0)
Hemoglobin: 10.7 g/dL — ABNORMAL LOW (ref 13.0–17.0)
LYMPHS ABS: 1.5 10*3/uL (ref 0.7–4.0)
Lymphocytes Relative: 29 % (ref 12–46)
MCH: 39.5 pg — ABNORMAL HIGH (ref 26.0–34.0)
MCHC: 34.2 g/dL (ref 30.0–36.0)
MCV: 115.5 fL — ABNORMAL HIGH (ref 78.0–100.0)
Monocytes Absolute: 0.7 10*3/uL (ref 0.1–1.0)
Monocytes Relative: 13 % — ABNORMAL HIGH (ref 3–12)
NEUTROS ABS: 3 10*3/uL (ref 1.7–7.7)
Neutrophils Relative %: 57 % (ref 43–77)
Platelets: 292 10*3/uL (ref 150–400)
RBC: 2.71 MIL/uL — ABNORMAL LOW (ref 4.22–5.81)
RDW: 20.9 % — AB (ref 11.5–15.5)
WBC: 5.3 10*3/uL (ref 4.0–10.5)

## 2013-09-25 LAB — BASIC METABOLIC PANEL
Anion gap: 15 (ref 5–15)
BUN: 27 mg/dL — AB (ref 6–23)
CALCIUM: 9.9 mg/dL (ref 8.4–10.5)
CO2: 24 mEq/L (ref 19–32)
CREATININE: 0.87 mg/dL (ref 0.50–1.35)
Chloride: 99 mEq/L (ref 96–112)
GFR calc non Af Amer: >90 mL/min (ref >90)
Glucose, Bld: 93 mg/dL (ref 70–99)
Potassium: 4.2 mEq/L (ref 3.7–5.3)
Sodium: 138 mEq/L (ref 137–147)

## 2013-09-25 LAB — MAGNESIUM: Magnesium: 2 mg/dL (ref 1.5–2.5)

## 2013-09-29 LAB — CBC WITH DIFFERENTIAL/PLATELET
Basophils Absolute: 0.1 10*3/uL (ref 0.0–0.1)
Basophils Relative: 1 % (ref 0–1)
Eosinophils Absolute: 0.1 10*3/uL (ref 0.0–0.7)
Eosinophils Relative: 2 % (ref 0–5)
HCT: 29.9 % — ABNORMAL LOW (ref 39.0–52.0)
Hemoglobin: 10.3 g/dL — ABNORMAL LOW (ref 13.0–17.0)
LYMPHS ABS: 1.4 10*3/uL (ref 0.7–4.0)
Lymphocytes Relative: 26 % (ref 12–46)
MCH: 39.8 pg — AB (ref 26.0–34.0)
MCHC: 34.4 g/dL (ref 30.0–36.0)
MCV: 115.4 fL — ABNORMAL HIGH (ref 78.0–100.0)
MONO ABS: 0.6 10*3/uL (ref 0.1–1.0)
Monocytes Relative: 11 % (ref 3–12)
Neutro Abs: 3.1 10*3/uL (ref 1.7–7.7)
Neutrophils Relative %: 60 % (ref 43–77)
PLATELETS: 267 10*3/uL (ref 150–400)
RBC: 2.59 MIL/uL — ABNORMAL LOW (ref 4.22–5.81)
RDW: 19.7 % — AB (ref 11.5–15.5)
WBC: 5.3 10*3/uL (ref 4.0–10.5)

## 2013-09-29 LAB — PREALBUMIN: PREALBUMIN: 19.8 mg/dL (ref 17.0–34.0)

## 2013-09-29 LAB — COMPREHENSIVE METABOLIC PANEL
ALT: 16 U/L (ref 0–53)
AST: 22 U/L (ref 0–37)
Albumin: 3.2 g/dL — ABNORMAL LOW (ref 3.5–5.2)
Alkaline Phosphatase: 116 U/L (ref 39–117)
Anion gap: 13 (ref 5–15)
BUN: 22 mg/dL (ref 6–23)
CALCIUM: 9.5 mg/dL (ref 8.4–10.5)
CO2: 26 meq/L (ref 19–32)
CREATININE: 0.85 mg/dL (ref 0.50–1.35)
Chloride: 100 mEq/L (ref 96–112)
GFR calc non Af Amer: >90 mL/min (ref >90)
Glucose, Bld: 100 mg/dL — ABNORMAL HIGH (ref 70–99)
Potassium: 4.1 mEq/L (ref 3.7–5.3)
Sodium: 139 mEq/L (ref 137–147)
Total Bilirubin: 0.8 mg/dL (ref 0.3–1.2)
Total Protein: 7.1 g/dL (ref 6.0–8.3)

## 2013-09-29 LAB — C-REACTIVE PROTEIN: CRP: 2.4 mg/dL — AB (ref <0.60)

## 2013-10-02 LAB — T-HELPER CELLS (CD4) COUNT (NOT AT ARMC)
CD4 % Helper T Cell: 16 % — ABNORMAL LOW (ref 33–55)
CD4 T Cell Abs: 200 /uL — ABNORMAL LOW (ref 400–2700)

## 2013-10-03 LAB — MAGNESIUM: Magnesium: 2 mg/dL (ref 1.5–2.5)

## 2013-10-03 LAB — CBC WITH DIFFERENTIAL/PLATELET
Basophils Absolute: 0.1 10*3/uL (ref 0.0–0.1)
Basophils Relative: 1 % (ref 0–1)
EOS ABS: 0.1 10*3/uL (ref 0.0–0.7)
Eosinophils Relative: 2 % (ref 0–5)
HCT: 30.2 % — ABNORMAL LOW (ref 39.0–52.0)
Hemoglobin: 10.3 g/dL — ABNORMAL LOW (ref 13.0–17.0)
Lymphocytes Relative: 26 % (ref 12–46)
Lymphs Abs: 1.4 10*3/uL (ref 0.7–4.0)
MCH: 39.8 pg — ABNORMAL HIGH (ref 26.0–34.0)
MCHC: 34.1 g/dL (ref 30.0–36.0)
MCV: 116.6 fL — ABNORMAL HIGH (ref 78.0–100.0)
MONO ABS: 0.5 10*3/uL (ref 0.1–1.0)
Monocytes Relative: 9 % (ref 3–12)
Neutro Abs: 3.4 10*3/uL (ref 1.7–7.7)
Neutrophils Relative %: 62 % (ref 43–77)
PLATELETS: 288 10*3/uL (ref 150–400)
RBC: 2.59 MIL/uL — ABNORMAL LOW (ref 4.22–5.81)
RDW: 18.5 % — AB (ref 11.5–15.5)
WBC: 5.5 10*3/uL (ref 4.0–10.5)

## 2013-10-03 LAB — BASIC METABOLIC PANEL
ANION GAP: 13 (ref 5–15)
BUN: 19 mg/dL (ref 6–23)
CO2: 25 mEq/L (ref 19–32)
Calcium: 9.8 mg/dL (ref 8.4–10.5)
Chloride: 102 mEq/L (ref 96–112)
Creatinine, Ser: 0.82 mg/dL (ref 0.50–1.35)
GLUCOSE: 112 mg/dL — AB (ref 70–99)
POTASSIUM: 4.3 meq/L (ref 3.7–5.3)
Sodium: 140 mEq/L (ref 137–147)

## 2013-10-03 LAB — PHOSPHORUS: PHOSPHORUS: 3.3 mg/dL (ref 2.3–4.6)

## 2013-11-05 ENCOUNTER — Ambulatory Visit (INDEPENDENT_AMBULATORY_CARE_PROVIDER_SITE_OTHER): Payer: No Typology Code available for payment source | Admitting: Infectious Diseases

## 2013-11-05 ENCOUNTER — Encounter: Payer: Self-pay | Admitting: Infectious Diseases

## 2013-11-05 VITALS — BP 112/73 | HR 101 | Temp 97.6°F | Ht 74.0 in | Wt 136.0 lb

## 2013-11-05 DIAGNOSIS — L89154 Pressure ulcer of sacral region, stage 4: Secondary | ICD-10-CM

## 2013-11-05 DIAGNOSIS — E43 Unspecified severe protein-calorie malnutrition: Secondary | ICD-10-CM

## 2013-11-05 DIAGNOSIS — Z23 Encounter for immunization: Secondary | ICD-10-CM

## 2013-11-05 DIAGNOSIS — M009 Pyogenic arthritis, unspecified: Secondary | ICD-10-CM

## 2013-11-05 DIAGNOSIS — B20 Human immunodeficiency virus [HIV] disease: Secondary | ICD-10-CM

## 2013-11-05 LAB — CBC
HCT: 35.9 % — ABNORMAL LOW (ref 39.0–52.0)
Hemoglobin: 12.4 g/dL — ABNORMAL LOW (ref 13.0–17.0)
MCH: 39.5 pg — ABNORMAL HIGH (ref 26.0–34.0)
MCHC: 34.5 g/dL (ref 30.0–36.0)
MCV: 114.3 fL — ABNORMAL HIGH (ref 78.0–100.0)
PLATELETS: 256 10*3/uL (ref 150–400)
RBC: 3.14 MIL/uL — ABNORMAL LOW (ref 4.22–5.81)
RDW: 14.3 % (ref 11.5–15.5)
WBC: 6 10*3/uL (ref 4.0–10.5)

## 2013-11-05 LAB — COMPREHENSIVE METABOLIC PANEL
ALK PHOS: 105 U/L (ref 39–117)
ALT: 14 U/L (ref 0–53)
AST: 20 U/L (ref 0–37)
Albumin: 4.4 g/dL (ref 3.5–5.2)
BUN: 23 mg/dL (ref 6–23)
CALCIUM: 10.2 mg/dL (ref 8.4–10.5)
CO2: 23 mEq/L (ref 19–32)
Chloride: 103 mEq/L (ref 96–112)
Creat: 1.04 mg/dL (ref 0.50–1.35)
Glucose, Bld: 98 mg/dL (ref 70–99)
POTASSIUM: 4.5 meq/L (ref 3.5–5.3)
Sodium: 139 mEq/L (ref 135–145)
Total Bilirubin: 1 mg/dL (ref 0.2–1.2)
Total Protein: 7.7 g/dL (ref 6.0–8.3)

## 2013-11-05 LAB — C-REACTIVE PROTEIN

## 2013-11-05 NOTE — Assessment & Plan Note (Signed)
Greatly appreciate wound care f/u. Needs to continue off loading, pt/ot, nutritional supplementation.

## 2013-11-05 NOTE — Assessment & Plan Note (Signed)
His wt is up. I encouraged him to take in as much calories as he can. He is taking protein supplements at his rehab center.

## 2013-11-05 NOTE — Assessment & Plan Note (Addendum)
Gets flu shot today. Will stop bactrim. Will get him with financial counselor today. Will check his HIV RNA, genotype, INI, HLA today. He gets flu shot today. see him back in 3 months.

## 2013-11-05 NOTE — Progress Notes (Signed)
   Subjective:    Patient ID: [name] [name], male    DOB: 24-Mar-1960, 53 y.o.   MRN: [id number]  HPI 53 yo M with HIV/AIDS, tobacco use, and anal CA (2008). Has been doing well. No problems with meds- ATVr/TFV/CBV (prev been on EFV, KLT), bactrim.  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 He was septic, requiring dopamine initially. He underwent resection of his R hip and debridement of his decubitus ulcer. His Cx grew group B Strep. He was d/c to [facility] to complete 6 weeks of ceftriaxone.   Today complains of weakness in his legs, has to use a walker at home. Is currently staying at a rehab center in [place]. Has had f/u with Dr [name].  Has had f/u with plastics- Dr Migdalia Dk, Dr Laverta Baltimore. Is letting wound heal from inside. Has been wound care at his rehab center.  Is unable to sleep on his back.  Has 7/10 pain from sitting on his wound.  No problems with ART. Has been on bactrim. Wt up 8# since 07-2013.    Review of Systems  Constitutional: Negative for appetite change and unexpected weight change.  Gastrointestinal: Negative for diarrhea and constipation.  Genitourinary: Negative for difficulty urinating.       Objective:   Physical Exam  Constitutional: He appears well-developed. He appears cachectic.  HENT:  Mouth/Throat: No oropharyngeal exudate.  Eyes: EOM are normal. Pupils are equal, round, and reactive to light.  Neck: Neck supple.  Cardiovascular: Normal rate, regular rhythm and normal heart sounds.   Pulmonary/Chest: Effort normal and breath sounds normal.  Abdominal: Soft. Bowel sounds are normal. There is no tenderness. There is no rebound.  Lymphadenopathy:    He has no cervical adenopathy.  Skin:  I offered to examine his wound but he states that is dressed, he defers.           Assessment & Plan:

## 2013-11-05 NOTE — Assessment & Plan Note (Signed)
Greatly appreciate ortho f/u. Will check his ESR and CRP today.

## 2013-11-06 LAB — T-HELPER CELL (CD4) - (RCID CLINIC ONLY)
CD4 % Helper T Cell: 20 % — ABNORMAL LOW (ref 33–55)
CD4 T Cell Abs: 290 /uL — ABNORMAL LOW (ref 400–2700)

## 2013-11-06 LAB — HIV-1 RNA ULTRAQUANT REFLEX TO GENTYP+
HIV 1 RNA Quant: 10279 copies/mL — ABNORMAL HIGH (ref <20)
HIV-1 RNA Quant, Log: 4.01 {Log} — ABNORMAL HIGH (ref <1.30)

## 2013-11-06 LAB — SEDIMENTATION RATE: SED RATE: 51 mm/h — AB (ref 0–16)

## 2013-11-11 LAB — HLA B*5701: HLA-B 5701 W/RFLX HLA-B HIGH: NEGATIVE

## 2013-11-12 LAB — HIV-1 GENOTYPR PLUS

## 2013-11-13 LAB — HIV-1 INTEGRASE GENOTYPE

## 2013-11-26 ENCOUNTER — Telehealth: Payer: Self-pay | Admitting: *Deleted

## 2013-11-26 NOTE — Telephone Encounter (Signed)
Patient discharged 11/21/13 from rehab without any in home nursing and limited supplies. He wants to see Dr [name] [name] as he says that Dr [name] [name] knows what is going on with him and he needs labs and regular follow up as well. Gave the patient an appt for 12/01/13 and he advised his sister is helping him change the dressing on his wound.

## 2013-12-01 ENCOUNTER — Other Ambulatory Visit: Payer: Self-pay | Admitting: Infectious Diseases

## 2013-12-01 ENCOUNTER — Encounter: Payer: Self-pay | Admitting: Infectious Diseases

## 2013-12-01 ENCOUNTER — Ambulatory Visit (INDEPENDENT_AMBULATORY_CARE_PROVIDER_SITE_OTHER): Payer: No Typology Code available for payment source | Admitting: Infectious Diseases

## 2013-12-01 ENCOUNTER — Telehealth: Payer: Self-pay | Admitting: *Deleted

## 2013-12-01 VITALS — BP 128/80 | HR 90 | Temp 97.7°F | Wt 138.0 lb

## 2013-12-01 DIAGNOSIS — E43 Unspecified severe protein-calorie malnutrition: Secondary | ICD-10-CM

## 2013-12-01 DIAGNOSIS — K029 Dental caries, unspecified: Secondary | ICD-10-CM

## 2013-12-01 DIAGNOSIS — L89154 Pressure ulcer of sacral region, stage 4: Secondary | ICD-10-CM

## 2013-12-01 DIAGNOSIS — B20 Human immunodeficiency virus [HIV] disease: Secondary | ICD-10-CM

## 2013-12-01 DIAGNOSIS — M009 Pyogenic arthritis, unspecified: Secondary | ICD-10-CM

## 2013-12-01 DIAGNOSIS — E440 Moderate protein-calorie malnutrition: Secondary | ICD-10-CM

## 2013-12-01 MED ORDER — DOLUTEGRAVIR SODIUM 50 MG PO TABS: 50.0000 mg | ORAL_TABLET | Freq: Every day | ORAL | Status: DC

## 2013-12-01 MED ORDER — DARUNAVIR-COBICISTAT 800-150 MG PO TABS: 1.0000 | ORAL_TABLET | Freq: Every day | ORAL | Status: DC

## 2013-12-01 MED ORDER — RILPIVIRINE HCL 25 MG PO TABS: 25.0000 mg | ORAL_TABLET | Freq: Every day | ORAL | Status: DC

## 2013-12-01 NOTE — Progress Notes (Signed)
Patient ID: [name], male   DOB: 04/10/1960, 53 y.o.   MRN: [id number] HPI: [name] [name] is a 53 y.o. male with HIV who is here for his follow up.   Allergies: No Known Allergies  Vitals: Temp: 97.7 F (36.5 C) (11/23 1030) Temp Source: Oral (11/23 1030) BP: 128/80 mmHg (11/23 1030) Pulse Rate: 90 (11/23 1030)  Past Medical History: Past Medical History  Diagnosis Date  . Radiation   . HIV (human immunodeficiency virus infection)   . Shingles     left shoulder, right leg  . Pain in limb 07/23/2013  . Rectal cancer     Squamous cell  . Gout     Social History: History   Social History  . Marital Status: Single    Spouse Name: N/A    Number of Children: 0  . Years of Education: BA   Occupational History  . disabled    Social History Main Topics  . Smoking status: Former Smoker -- 0.30 packs/day    Types: Cigarettes    Quit date: 07/10/2013  . Smokeless tobacco: Never Used  . Alcohol Use: No  . Drug Use: No     Comment: stopped 07/10/13  . Sexual Activity: No     Comment: declined condoms   Other Topics Concern  . None   Social History Narrative    Previous Regimen:   Current Regimen: Combivir + TDF + ATV/r  Labs: HIV 1 RNA QUANT (copies/mL)  Date Value  11/05/2013 10279*  08/13/2013 377*  08/02/2013 251*   CD4 T CELL ABS (/uL)  Date Value  11/05/2013 290*  10/01/2013 200*  08/23/2013 130*   HEP B S AB (no units)  Date Value  03/05/2006 YES   HEPATITIS B SURFACE AG (no units)  Date Value  03/05/2006 NO   HCV AB (no units)  Date Value  03/05/2006 INDETER    CrCl: Estimated Creatinine Clearance: 74.4 mL/min (by C-G formula based on Cr of 1.04).  Lipids:    Component Value Date/Time   CHOL 142 03/28/2013 1130   TRIG 70 03/28/2013 1130   HDL 54 03/28/2013 1130   CHOLHDL 2.6 03/28/2013 1130   VLDL 14 03/28/2013 1130   LDLCALC 74 03/28/2013 1130    Assessment: 53 yo who has been on ATV/r + TDF + CBV for his HIV. He has  had issues with compliance in the past. His most recent genotype showed wild type but he has several mutations in the past that have shown resistance to mostly to TDF but still sens to AZT. 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 Dw with [name] and we are going to attempt Prezcobix/Dolutegravir/rilpiverine so that it can be once daily. I really stressed to him that this is his salvage regimen. He understand and agreed to the new regimen.   Recommendations:  Stop TDF/ATV/r/combivir Start Prezcobix 1 tab PO qday Start DTG 50mg  PO qday Start Rilpiverine 25mg  PO qday  Wilfred Lacy, PharmD Clinical Infectious [facility] 12/01/2013, 1:33 PM

## 2013-12-01 NOTE — Assessment & Plan Note (Signed)
Will refer him to dental.  

## 2013-12-01 NOTE — Addendum Note (Signed)
Addended by: Zanna Hawn C on: 12/01/2013 11:37 AM   Modules accepted: Orders

## 2013-12-01 NOTE — Assessment & Plan Note (Signed)
DTGV/Prezcobix/RPV. Greatly appreciate pharm helping with med suggestions, pt counseling.  Will try to coordinate him home health, wound needs. Will see him back in 6 weeks.

## 2013-12-01 NOTE — Assessment & Plan Note (Signed)
He appears to be doing well. Has f/u with Dr [name].

## 2013-12-01 NOTE — Telephone Encounter (Signed)
Referral faxed to [facility] at [phone] for nursing (wound care). Phone [phone]  Patient notified that a nurse will be coming out on 12/06/13. Order was placed for three weekly visits. Myrtis Hopping

## 2013-12-01 NOTE — Assessment & Plan Note (Signed)
This appears to be healing well, albeit slowly. He needs improved nutrition, will arrange home health, f/u with wound care.

## 2013-12-01 NOTE — Progress Notes (Signed)
   Subjective:    Patient ID: [name] [name], male    DOB: 12/04/1960, 53 y.o.   MRN: [id number]  HPI 53 yo M with HIV/AIDS, tobacco use, and anal CA (2008). Has been doing well. No problems with meds- ATVr/TFV/CBV (prev been on EFV, KLT), bactrim.  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 He was septic, requiring dopamine initially. He underwent resection of his R hip and debridement of his decubitus ulcer. His Cx grew group B Strep. He was d/c to [facility] to complete 6 weeks of ceftriaxone.  He was at [facility] 9-25 to 11-6. He states he did not have home care after d/c. States his wound has been getting smaller, sister is measuring.  10# up since July.  Has f/u with Dr [name] next month.  Does not have f/u with Dr Migdalia Dk.   HIV 1 RNA QUANT (copies/mL)  Date Value  11/05/2013 10279*  08/13/2013 377*  08/02/2013 251*   CD4 T CELL ABS (/uL)  Date Value  11/05/2013 290*  10/01/2013 200*  08/23/2013 130*    Review of Systems     Objective:   Physical Exam  Constitutional: He appears cachectic.  HENT:  Mouth/Throat: No oropharyngeal exudate.  Eyes: EOM are normal. Pupils are equal, round, and reactive to light.  Neck: Neck supple.  Cardiovascular: Normal rate, regular rhythm and normal heart sounds.   Pulmonary/Chest: Effort normal and breath sounds normal.  Abdominal: Soft. Bowel sounds are normal. He exhibits no distension. There is no tenderness.  Musculoskeletal:       Arms: Lymphadenopathy:    He has no cervical adenopathy.          Assessment & Plan:

## 2013-12-01 NOTE — Assessment & Plan Note (Signed)
Needs nutrition f/u.

## 2013-12-02 ENCOUNTER — Telehealth: Payer: Self-pay

## 2013-12-02 NOTE — Telephone Encounter (Signed)
[place] sent approval for Prezcobix .  Coverage from: 12-02-13 to 12-02-14 .   Authorization # [id number]  Laverle Patter, RN    Form completed and faxed to [facility].  [phone]  Fax  [phone]  Laverle Patter, RN

## 2013-12-03 ENCOUNTER — Other Ambulatory Visit: Payer: Self-pay | Admitting: *Deleted

## 2013-12-03 DIAGNOSIS — B20 Human immunodeficiency virus [HIV] disease: Secondary | ICD-10-CM

## 2013-12-03 MED ORDER — DARUNAVIR-COBICISTAT 800-150 MG PO TABS: 1.0000 | ORAL_TABLET | Freq: Every day | ORAL | Status: DC

## 2013-12-08 ENCOUNTER — Telehealth: Payer: Self-pay | Admitting: *Deleted

## 2013-12-08 NOTE — Telephone Encounter (Signed)
Agree with santyl thanks

## 2013-12-08 NOTE — Telephone Encounter (Signed)
Carlota Raspberry, RN from [facility] called to advise they started 3x weekly home visits for wound care on 11/28.   She noted that the wound has "a lot of slough" would like an order for santyl ointment in place of the calcium alginate.   Please advise if [facility] is managing patient's wound care orders. Landis Gandy, RN

## 2013-12-09 NOTE — Telephone Encounter (Signed)
Verbal order given. [name] [name] will fax the order for signing.

## 2013-12-10 NOTE — Telephone Encounter (Addendum)
Prezcobix issue.  [facility] telling pt that insurance is not covering.   [facility] reprocessed rx - OKed.  [facility] ordering medication and will arrive tomorrow, 12/11/13 after 3 PM.  Pt notified of when rx will be ready for pick-up.

## 2013-12-15 ENCOUNTER — Encounter (HOSPITAL_BASED_OUTPATIENT_CLINIC_OR_DEPARTMENT_OTHER): Payer: No Typology Code available for payment source | Attending: Plastic Surgery

## 2013-12-15 DIAGNOSIS — L89154 Pressure ulcer of sacral region, stage 4: Secondary | ICD-10-CM | POA: Insufficient documentation

## 2013-12-15 NOTE — Progress Notes (Signed)
[facility]  NAME:  [name] [name], [name] [name]                ACCOUNT NO.:  [id number]  MEDICAL RECORD NO.:  [id number]      DATE OF BIRTH:  [birth year]  PHYSICIAN:  Theodoro Kos, DO       VISIT DATE:  12/15/2013                                  OFFICE VISIT   The patient is a 53 year old male who is here for followup on his sacral ulcer.  He has HIV, AIDS, and was treated for anal cancer and was radiated.  He then had some orthopedic issues and ended up with a sacral ulcer.  He had an extremely large wound that we treated with debridement and ACell placement, and he is actually healing it up and doing extremely well.  He was in a facility for a while and he is now at home and his mom is taking care of him.  His RNA and CD4 T cells were reviewed and those were in the chart.  He is not a smoker.  His antibiotics were reviewed as well and are all listed in the chart.  REVIEW OF SYSTEMS:  Otherwise negative.  He has been using silver alginate on the area and it is improved markedly.  PHYSICAL EXAMINATION:  He is alert, oriented, cooperative, not in any distress.  He is very pleasant.  Pupils are equal.  Extraocular muscles are intact.  His breathing is unlabored.  His heart rate is regular. The wound is a fraction of its previous size.  There is a little bit of nerve exposed, but it is clean.  We will continue with local dressings with collagen and have him follow up in a few weeks.  He is to continue with offloading and protein intake.     Theodoro Kos, DO     CS/MEDQ  D:  12/15/2013  T:  12/15/2013  Job:  [id number]

## 2013-12-16 ENCOUNTER — Telehealth: Payer: Self-pay | Admitting: *Deleted

## 2013-12-16 NOTE — Telephone Encounter (Signed)
Patient's home health nurse is coming out three times a week for wound care and checks his B/P each time. It has been normal here at every visit. He will call us if it is low at home. Next follow up visit is 02/04/13. Myrtis Hopping CMA

## 2013-12-16 NOTE — Telephone Encounter (Signed)
He needs a visit to determine his BP and whether or not these to continue

## 2013-12-16 NOTE — Telephone Encounter (Signed)
Patient called asking if he should continue to take florinef and midodrine that he was given while in SNF for hypotension. He is going to call his insurance to find out which PCP are accepting his insurance to get established. Please advise if patient needs refills on these medications. Myrtis Hopping

## 2013-12-17 NOTE — Telephone Encounter (Signed)
If BPs normal, would not change his medicaitons

## 2014-01-09 HISTORY — PX: TRANSURETHRAL RESECTION OF PROSTATE: SHX73

## 2014-01-12 ENCOUNTER — Encounter (HOSPITAL_BASED_OUTPATIENT_CLINIC_OR_DEPARTMENT_OTHER): Payer: Medicaid Other | Attending: Plastic Surgery

## 2014-01-12 DIAGNOSIS — L89154 Pressure ulcer of sacral region, stage 4: Secondary | ICD-10-CM | POA: Diagnosis not present

## 2014-01-12 NOTE — Progress Notes (Signed)
[facility]  NAME:  [name] [name], [name] [name]                ACCOUNT NO.:  [id number]  MEDICAL RECORD NO.:  [id number]      DATE OF BIRTH:  [birth year]  PHYSICIAN:  Theodoro Kos, DO       VISIT DATE:  01/12/2014                                  OFFICE VISIT   The patient is a 54 year old who is here for followup on his sacral ulcer.  He underwent extensive debridement with ACell placement in the OR, and has done remarkably well.  He is able to rotate on both his right and his left hip now, so that helps to decrease the pressure.  He is using silver alginate.  He did try to do some collagen, but had a reaction to either the collagen or the Band-Aid, it was unclear.  There is no change in his medications or social history.  On exam, he is alert, oriented, cooperative, not in any distress.  His pupils are equal.  He is with family here today.  His breathing is unlabored.  He is not in any distress.  The wound is clean.  There is a little bit of fibrous tissue.  I am hesitant to debrided it, because this very well may be nerve from having been there before.  I know that he did have not exposed, so we will continue with the silver alginate and see him back in 2 weeks.  He can shower and change the dressing daily or every other day.     Theodoro Kos, DO     CS/MEDQ  D:  01/12/2014  T:  01/12/2014  Job:  [id number]

## 2014-01-26 ENCOUNTER — Encounter: Payer: Self-pay | Admitting: Dietician

## 2014-01-26 ENCOUNTER — Encounter: Payer: Medicaid Other | Attending: Infectious Diseases | Admitting: Dietician

## 2014-01-26 VITALS — Ht 74.0 in | Wt 143.2 lb

## 2014-01-26 DIAGNOSIS — E440 Moderate protein-calorie malnutrition: Secondary | ICD-10-CM | POA: Diagnosis present

## 2014-01-26 DIAGNOSIS — Z713 Dietary counseling and surveillance: Secondary | ICD-10-CM | POA: Diagnosis not present

## 2014-01-26 NOTE — Patient Instructions (Addendum)
Try whole milk to help add some extra calories. Add lean protein to breakfast such boiled eggs, protein shake, meat, or a breakfast sandwich. For other meals or snacks, add peanut butter, yogurt, cheese, or meat.  Try chicken salad or tuna salad sandwiches for meals or snacks.  Try have 2 protein shakes (Ensure, Premier, or Unflavored Unjury added to tea).  If you like Unjury, look for it at the [employer] Eat at least 3 meals per day and have 2-3 snacks if you are hungry (snacks can protein shakes).

## 2014-01-26 NOTE — Progress Notes (Signed)
  Medical Nutrition Therapy:  Appt start time: 1400 end time:  4967.   Assessment:  Primary concerns today: [name] [name] is here today since he lost a significant amount of weight about 6 months ago. Recently gained about 5 lbs since November. Had a septic hip and weight got down to 115 lbs in July. Had cancer treatments 4-5 years about. Weight was 145-150 lbs at that time and considers that to be his normal weight. Highest weight as an adult was 155 lbs. Also has a hx of HIV. Was seeing a wound care doctor for a pressure ulcer and it is still healing. It looks like it will close up in 3-4 weeks.  Appetite is good now. Skips about 5 meals per week, mostly breakfast. Lives at his sister who can change his bandages. Sister makes dinner meals.  Also has home health. Uses a walker since he had a hip replacement. Cannot go back to work until he stops using the walker. Works at [employer].  Would like to get his weight back to 150 lbs.   Preferred Learning Style:   No preference indicated   Learning Readiness:   Ready  MEDICATIONS: see list   DIETARY INTAKE:  Usual eating pattern includes 2-3 meals and 1-2 Ensures per day.  Avoided foods include: [place] food/garlic.    24-hr recall:  B ( 9-11 AM): bowl of cereal (apple jacks/fruit loops) with 2% milk  or a pack or nabs   Snk ( AM): none L (1-3 PM): deli meat sandwich or leftovers with sweet tea  Snk ( PM): none D ( PM): meat and 2 vegetables with bread and tea Snk ( PM): nabs- has with medication  Beverages: 1-2 Ensure per day around dinner, tea, [employer], bottled water with lemonade mix  Usual physical activity: doing physical therapy exercises everyday   Estimated energy needs: 2400 calories 270 g carbohydrates 180 g protein 67 g fat  Progress Towards Goal(s):  In progress.   Nutritional Diagnosis:  NI-5.7.1 Inadequate protein intake As related to hx of infection, open sacral wound, hip surgery and 25-30 lb weight loss in the  past 6 months.  As evidenced by diet recall and increased protein needs.    Intervention:  Nutrition counseling provided. Discussed having protein foods with each meal and snack. Plan: Try whole milk to help add some extra calories. Add lean protein to breakfast such boiled eggs, protein shake, meat, or a breakfast sandwich. For other meals or snacks, add peanut butter, yogurt, cheese, or meat.  Try chicken salad or tuna salad sandwiches for meals or snacks.  Try have 2 protein shakes (Ensure, Premier, or Unflavored Unjury added to tea).  If you like Unjury, look for it at the [employer] Eat at least 3 meals per day and have 2-3 snacks if you are hungry (snacks can protein shakes).  Teaching Method Utilized:  Visual Auditory Hands on  Supplements given during visit include:  [employer], lot [id number], exp 01/01/2015  2 Unflavored Unjury, lot # [id number], exp 11/16  Chocolate Unjury lot # [id number], 10/16  Barriers to learning/adherence to lifestyle change: limited mobility  Demonstrated degree of understanding via:  Teach Back   Monitoring/Evaluation:  Dietary intake, exercise, and body weight in 6 week(s).

## 2014-02-02 ENCOUNTER — Telehealth: Payer: Self-pay | Admitting: *Deleted

## 2014-02-02 ENCOUNTER — Ambulatory Visit (INDEPENDENT_AMBULATORY_CARE_PROVIDER_SITE_OTHER): Payer: Medicaid Other | Admitting: Infectious Diseases

## 2014-02-02 VITALS — BP 109/77 | HR 81 | Temp 98.0°F | Ht 74.0 in | Wt 140.0 lb

## 2014-02-02 DIAGNOSIS — K029 Dental caries, unspecified: Secondary | ICD-10-CM | POA: Diagnosis not present

## 2014-02-02 DIAGNOSIS — B20 Human immunodeficiency virus [HIV] disease: Secondary | ICD-10-CM

## 2014-02-02 DIAGNOSIS — Z113 Encounter for screening for infections with a predominantly sexual mode of transmission: Secondary | ICD-10-CM

## 2014-02-02 DIAGNOSIS — IMO0002 Reserved for concepts with insufficient information to code with codable children: Secondary | ICD-10-CM

## 2014-02-02 DIAGNOSIS — L89154 Pressure ulcer of sacral region, stage 4: Secondary | ICD-10-CM

## 2014-02-02 DIAGNOSIS — N358 Other urethral stricture: Secondary | ICD-10-CM

## 2014-02-02 DIAGNOSIS — Z79899 Other long term (current) drug therapy: Secondary | ICD-10-CM | POA: Diagnosis not present

## 2014-02-02 LAB — COMPREHENSIVE METABOLIC PANEL
ALK PHOS: 74 U/L (ref 39–117)
ALT: 12 U/L (ref 0–53)
AST: 17 U/L (ref 0–37)
Albumin: 4.5 g/dL (ref 3.5–5.2)
BILIRUBIN TOTAL: 0.6 mg/dL (ref 0.2–1.2)
BUN: 17 mg/dL (ref 6–23)
CHLORIDE: 104 meq/L (ref 96–112)
CO2: 25 meq/L (ref 19–32)
Calcium: 10.6 mg/dL — ABNORMAL HIGH (ref 8.4–10.5)
Creat: 1.47 mg/dL — ABNORMAL HIGH (ref 0.50–1.35)
Glucose, Bld: 97 mg/dL (ref 70–99)
Potassium: 4.4 mEq/L (ref 3.5–5.3)
Sodium: 139 mEq/L (ref 135–145)
TOTAL PROTEIN: 7.6 g/dL (ref 6.0–8.3)

## 2014-02-02 LAB — CBC
HEMATOCRIT: 41.3 % (ref 39.0–52.0)
HEMOGLOBIN: 13.6 g/dL (ref 13.0–17.0)
MCH: 34.1 pg — ABNORMAL HIGH (ref 26.0–34.0)
MCHC: 32.9 g/dL (ref 30.0–36.0)
MCV: 103.5 fL — ABNORMAL HIGH (ref 78.0–100.0)
MPV: 9.2 fL (ref 8.6–12.4)
PLATELETS: 221 10*3/uL (ref 150–400)
RBC: 3.99 MIL/uL — ABNORMAL LOW (ref 4.22–5.81)
RDW: 13.4 % (ref 11.5–15.5)
WBC: 7 10*3/uL (ref 4.0–10.5)

## 2014-02-02 LAB — LIPID PANEL
CHOL/HDL RATIO: 3.6 ratio
CHOLESTEROL: 199 mg/dL (ref 0–200)
HDL: 55 mg/dL (ref >39)
LDL CALC: 115 mg/dL — AB (ref 0–99)
Triglycerides: 145 mg/dL (ref <150)
VLDL: 29 mg/dL (ref 0–40)

## 2014-02-02 LAB — RPR

## 2014-02-02 NOTE — Assessment & Plan Note (Signed)
Will get him into dental.  he is doing well.  Has gotten flu shot.  Will check his labs today.  Will see him back in 4 months.

## 2014-02-02 NOTE — Assessment & Plan Note (Signed)
Has f/u at [facility] for this.

## 2014-02-02 NOTE — Assessment & Plan Note (Signed)
This is healing quite well, greatly appreciate Dr Leafy Ro f/u.

## 2014-02-02 NOTE — Telephone Encounter (Signed)
Called the [employer].  [place] has been approved for $7500.00 grant.  He is eligible through 02-02-15.

## 2014-02-02 NOTE — Progress Notes (Signed)
   Subjective:    Patient ID: [name] [name], male    DOB: [birth year], 54 y.o.   MRN: [id number]  HPI 54 yo M with HIV/AIDS, tobacco use, and anal CA (2008). Has been doing well. No problems with meds- ATVr/TFV/CBV (prev been on EFV, KLT), bactrim.  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 He was septic, requiring dopamine initially. He underwent resection of his R hip and debridement of his decubitus ulcer. His Cx grew group B Strep. He was d/c to [facility] to complete 6 weeks of ceftriaxone.  He was at [facility] 9-25 to 11-6. He states he did not have home care after d/c. States his wound has been improving.  Getting help from his sister.   Has appt with Dr Migdalia Dk next week. Has home health f/u.  He was seen in f/u 12-01-13 and changed to Prezcobix/RPV/DTGV qday.  No problems with this (had diarrhea on 1 day only).   HIV 1 RNA QUANT (copies/mL)  Date Value  11/05/2013 10279*  08/13/2013 377*  08/02/2013 251*   CD4 T CELL ABS (/uL)  Date Value  11/05/2013 290*  10/01/2013 200*  08/23/2013 130*   Has gained some wt. 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   Review of Systems  Constitutional: Negative for appetite change and unexpected weight change.  Gastrointestinal: Negative for diarrhea and constipation.  Genitourinary: Negative for difficulty urinating.       Objective:   Physical Exam  Constitutional: He appears well-developed and well-nourished.  HENT:  Mouth/Throat: Abnormal dentition. Dental caries present. No oropharyngeal exudate.  Eyes: EOM are normal. Pupils are equal, round, and reactive to light.  Neck: Neck supple.  Cardiovascular: Normal rate, regular rhythm and normal heart sounds.   Pulmonary/Chest: Effort normal and breath sounds normal.  Abdominal: Soft. Bowel sounds are normal. He exhibits no distension. There is no  tenderness.  Musculoskeletal: He exhibits no edema.       Arms: Lymphadenopathy:    He has no cervical adenopathy.          Assessment & Plan:

## 2014-02-02 NOTE — Assessment & Plan Note (Signed)
Greatly appreciate [facility].

## 2014-02-03 LAB — T-HELPER CELL (CD4) - (RCID CLINIC ONLY)
CD4 % Helper T Cell: 17 % — ABNORMAL LOW (ref 33–55)
CD4 T CELL ABS: 310 /uL — AB (ref 400–2700)

## 2014-02-03 LAB — HIV-1 RNA QUANT-NO REFLEX-BLD
HIV 1 RNA Quant: 4947 copies/mL — ABNORMAL HIGH (ref <20)
HIV-1 RNA QUANT, LOG: 3.69 {Log} — AB (ref <1.30)

## 2014-02-04 ENCOUNTER — Ambulatory Visit: Payer: No Typology Code available for payment source | Admitting: Infectious Diseases

## 2014-02-05 ENCOUNTER — Encounter: Payer: Self-pay | Admitting: Infectious Diseases

## 2014-02-09 ENCOUNTER — Encounter (HOSPITAL_BASED_OUTPATIENT_CLINIC_OR_DEPARTMENT_OTHER): Payer: Medicaid Other | Attending: Plastic Surgery

## 2014-02-09 DIAGNOSIS — L89159 Pressure ulcer of sacral region, unspecified stage: Secondary | ICD-10-CM | POA: Diagnosis present

## 2014-02-10 NOTE — Progress Notes (Signed)
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  He has been using alginate on the area.  Home Health has asked about Santyl.  No change in his medications or social history, but he did have a TURP recently for stones and is doing much better from that.  He is alert, oriented, cooperative, not in any distress.  He is very pleasant.  His sister is not with him today.  His breathing is unlabored.  His heart rate is regular.  The wound is still showing signs of improvement with alginate, does not appear to be infected.  He has some periwound breakdown from the tape, so we will try and use some silicone dressings to help prevent that from happening again.  Continue with the protein, __________ vitamin C, and zinc, and hold off on any Santyl because I am concerned that it will breakdown the radiated area as well, so continue with the alginate.     Theodoro Kos, DO     CS/MEDQ  D:  02/09/2014  T:  02/10/2014  Job:  [id number]

## 2014-02-24 ENCOUNTER — Encounter: Payer: Medicaid Other | Attending: Infectious Diseases | Admitting: Dietician

## 2014-02-24 VITALS — Ht 74.0 in | Wt 142.8 lb

## 2014-02-24 DIAGNOSIS — Z713 Dietary counseling and surveillance: Secondary | ICD-10-CM | POA: Diagnosis not present

## 2014-02-24 DIAGNOSIS — E440 Moderate protein-calorie malnutrition: Secondary | ICD-10-CM | POA: Diagnosis not present

## 2014-02-24 NOTE — Patient Instructions (Addendum)
Continue to add lean protein to breakfast and snacks such boiled eggs, protein shake, meat, or a breakfast sandwich. For other meals or snacks, add peanut butter, yogurt, cheese, or meat.  Try have 2 protein shakes. Eat at least 3 meals per day and have 2-3 snacks if you are hungry (snacks can protein shakes). Drink 2-3, 8 oz of milk (or chocolate) per day.

## 2014-02-24 NOTE — Progress Notes (Signed)
  Medical Nutrition Therapy:  Appt start time: 1430 end time:  0233.  Assessment:  Primary concerns today: [name] [name] is here today since he lost a significant amount of weight about 6 months ago. Weight is stable since last time. Had urology procedure 3 weeks ago and thinks he lost weight at that time and has regained the weight he lost. Tried unjury protein and is going continue with Ensure (high protein). Having 1-2 protein shakes per day. Added protein to breakfast. Added beans to dinner and drinking whole milk.   Is happy with his weight now as long as it does not go down any more, though ideally like to get his weight back to 150 lbs.   Wt Readings from Last 3 Encounters:  02/24/14 142 lb 12.8 oz (64.774 kg)  02/02/14 140 lb (63.504 kg)  01/26/14 143 lb 3.2 oz (64.955 kg)   Ht Readings from Last 3 Encounters:  02/24/14 6\' 2"  (1.88 m)  02/02/14 6\' 2"  (1.88 m)  01/26/14 6\' 2"  (1.88 m)   Body mass index is 18.33 kg/(m^2). @BMIFA @ Normalized weight-for-age data available only for age 73 to 64 years. Normalized stature-for-age data available only for age 73 to 80 years.   Preferred Learning Style:   No preference indicated   Learning Readiness:   Ready  MEDICATIONS: see list   DIETARY INTAKE:  Usual eating pattern includes 2-3 meals and 1-2 Ensures per day.  Avoided foods include: [place] food/garlic.    24-hr recall:  B ( 9-11 AM): bowl of cereal (apple jacks/fruit loops) with whole milk with eggs and sometimes bacon  Snk ( AM): pack of nabs L (1-3 PM): deli meat sandwich or leftovers with sweet tea  Snk ( PM): none D ( PM): meat, beans, and 2 vegetables with bread and tea Snk ( PM): nabs- has with medication  Beverages: 1-2 Ensure per day around dinner, tea, [employer], bottled water with lemonade mix, 1 glass of milk   Usual physical activity: doing physical therapy exercises everyday   Estimated energy needs: 2400 calories 270 g carbohydrates 180 g protein 67 g  fat  Progress Towards Goal(s):  In progress.   Nutritional Diagnosis:  NI-5.7.1 Inadequate protein intake As related to hx of infection, open sacral wound, hip surgery and 25-30 lb weight loss in the past 6 months.  As evidenced by diet recall and increased protein needs.    Intervention:  Nutrition counseling provided. Discussed having protein foods with each meal and snack. Plan: Continue to add lean protein to breakfast and snacks such boiled eggs, protein shake, meat, or a breakfast sandwich. For other meals or snacks, add peanut butter, yogurt, cheese, or meat.  Try have 2 protein shakes. Eat at least 3 meals per day and have 2-3 snacks if you are hungry (snacks can protein shakes). Drink 2-3, 8 oz of milk (or chocolate) per day.  Teaching Method Utilized:  Visual Auditory Hands on  Barriers to learning/adherence to lifestyle change: limited mobility  Demonstrated degree of understanding via:  Teach Back   Monitoring/Evaluation:  Dietary intake, exercise, and body weight in 2 month(s).

## 2014-03-02 DIAGNOSIS — L89159 Pressure ulcer of sacral region, unspecified stage: Secondary | ICD-10-CM | POA: Diagnosis not present

## 2014-03-03 NOTE — Progress Notes (Signed)
[facility]  NAME:  EMEKA, LINDNER                ACCOUNT NO.:  [id number]  MEDICAL RECORD NO.:  [id number]      DATE OF BIRTH:  1960-10-21  PHYSICIAN:  Theodoro Kos, DO       VISIT DATE:  03/02/2014                                  OFFICE VISIT   The patient is a 54 year old male who is here for followup on his chronic sacral ulcer.  He is doing unbelievably well.  He is alert, oriented, cooperative, not in any distress.  He is very pleasant.  His breathing is unlabored.  His heart rate is regular.  His wound is not infected.  He has a little bit of a scab area but is very small in size. Sizes are noted in the chart.  He has some periwound irritation and breakdown which we think is mostly from the tape.  So, for today we are going to try and do a triple antibiotic gauze with no tape and leave it in place and see if that will help calm down the rest of the skin.  I will see him back in a few weeks.     Theodoro Kos, DO     CS/MEDQ  D:  03/02/2014  T:  03/03/2014  Job:  [id number]

## 2014-03-23 ENCOUNTER — Encounter (HOSPITAL_BASED_OUTPATIENT_CLINIC_OR_DEPARTMENT_OTHER): Payer: No Typology Code available for payment source | Attending: Plastic Surgery

## 2014-03-23 DIAGNOSIS — L89159 Pressure ulcer of sacral region, unspecified stage: Secondary | ICD-10-CM | POA: Insufficient documentation

## 2014-03-23 DIAGNOSIS — C20 Malignant neoplasm of rectum: Secondary | ICD-10-CM | POA: Insufficient documentation

## 2014-04-13 ENCOUNTER — Encounter (HOSPITAL_BASED_OUTPATIENT_CLINIC_OR_DEPARTMENT_OTHER): Payer: No Typology Code available for payment source | Attending: Plastic Surgery

## 2014-04-13 DIAGNOSIS — Z9889 Other specified postprocedural states: Secondary | ICD-10-CM | POA: Insufficient documentation

## 2014-04-13 DIAGNOSIS — C20 Malignant neoplasm of rectum: Secondary | ICD-10-CM | POA: Diagnosis not present

## 2014-04-13 DIAGNOSIS — Z923 Personal history of irradiation: Secondary | ICD-10-CM | POA: Insufficient documentation

## 2014-04-13 DIAGNOSIS — L89154 Pressure ulcer of sacral region, stage 4: Secondary | ICD-10-CM | POA: Diagnosis not present

## 2014-04-28 ENCOUNTER — Ambulatory Visit: Payer: No Typology Code available for payment source | Admitting: Dietician

## 2014-05-04 DIAGNOSIS — L89154 Pressure ulcer of sacral region, stage 4: Secondary | ICD-10-CM | POA: Diagnosis not present

## 2014-05-06 ENCOUNTER — Other Ambulatory Visit: Payer: No Typology Code available for payment source

## 2014-05-06 DIAGNOSIS — Z113 Encounter for screening for infections with a predominantly sexual mode of transmission: Secondary | ICD-10-CM

## 2014-05-06 DIAGNOSIS — B20 Human immunodeficiency virus [HIV] disease: Secondary | ICD-10-CM

## 2014-05-07 ENCOUNTER — Telehealth: Payer: Self-pay | Admitting: *Deleted

## 2014-05-07 LAB — T-HELPER CELL (CD4) - (RCID CLINIC ONLY)
CD4 T CELL HELPER: 17 % — AB (ref 33–55)
CD4 T Cell Abs: 320 /uL — ABNORMAL LOW (ref 400–2700)

## 2014-05-07 LAB — HIV-1 RNA QUANT-NO REFLEX-BLD
HIV 1 RNA Quant: 271 copies/mL — ABNORMAL HIGH (ref <20)
HIV-1 RNA Quant, Log: 2.43 {Log} — ABNORMAL HIGH (ref <1.30)

## 2014-05-07 NOTE — Telephone Encounter (Signed)
Received request from Dr. [name] for surgery clearance for patient from an ID stand point. Per [name] his HIV viral load is still pending and he can not provide clearance until he reviews these labs. Myrtis Hopping

## 2014-05-13 ENCOUNTER — Telehealth: Payer: Self-pay | Admitting: *Deleted

## 2014-05-13 NOTE — Telephone Encounter (Signed)
Surgery clearance signed by [name] and faxed to [place], attn [name] at [phone]. Confirmation received [name] [name]

## 2014-05-18 ENCOUNTER — Encounter (HOSPITAL_BASED_OUTPATIENT_CLINIC_OR_DEPARTMENT_OTHER): Payer: No Typology Code available for payment source | Attending: Plastic Surgery

## 2014-05-18 DIAGNOSIS — Z85048 Personal history of other malignant neoplasm of rectum, rectosigmoid junction, and anus: Secondary | ICD-10-CM | POA: Insufficient documentation

## 2014-05-18 DIAGNOSIS — L98411 Non-pressure chronic ulcer of buttock limited to breakdown of skin: Secondary | ICD-10-CM | POA: Insufficient documentation

## 2014-05-18 DIAGNOSIS — Z923 Personal history of irradiation: Secondary | ICD-10-CM | POA: Diagnosis not present

## 2014-05-18 DIAGNOSIS — L89159 Pressure ulcer of sacral region, unspecified stage: Secondary | ICD-10-CM | POA: Diagnosis present

## 2014-05-20 ENCOUNTER — Ambulatory Visit: Payer: No Typology Code available for payment source | Admitting: Infectious Diseases

## 2014-06-09 NOTE — Patient Instructions (Addendum)
[facility] [place]  06/09/2014           Your procedure is scheduled on:    06/15/2014     Report to [facility] Main  Entrance and follow signs to               [place] at   1:00 MPM   Call this number if you have problems the morning of surgery [phone]   Remember: ONLY 1 PERSON MAY GO WITH YOU TO SHORT STAY TO GET  READY MORNING OF YOUR SURGERY.   Do not eat food after midnite.  May have clear liquids until 09:20 am then nothing by mouth am of surgery.      Take these medicines the morning of surgery with A SIP OF WATER: CIPRO / DOXYCYCLINE                                 You may not have any metal on your body including hair pins and              piercings  Do not wear jewelry, , lotions, powders or perfumes, deodorant                           Men may shave face and neck.   Do not bring valuables to the hospital. [place].  Contacts, dentures or bridgework may not be worn into surgery.  Leave suitcase in the car. After surgery it may be brought to your room.         Special Instructions: coughing and deep breathing exercises, leg exercises               Please read over the following fact sheets you were given: _____________________________________________________________________             [facility] - Preparing for Surgery Before surgery, you can play an important role.  Because skin is not sterile, your skin needs to be as free of germs as possible.  You can reduce the number of germs on your skin by washing with CHG (chlorahexidine gluconate) soap before surgery.  CHG is an antiseptic cleaner which kills germs and bonds with the skin to continue killing germs even after washing. Please DO NOT use if you have an allergy to CHG or antibacterial soaps.  If your skin becomes reddened/irritated stop using the CHG and inform your nurse when you arrive at Short Stay. Do not shave (including  legs and underarms) for at least 48 hours prior to the first CHG shower.  You may shave your face/neck. Please follow these instructions carefully:  1.  Shower with CHG Soap the night before surgery and the  morning of Surgery.  2.  If you choose to wash your hair, wash your hair first as usual with your  normal  shampoo.  3.  After you shampoo, rinse your hair and body thoroughly to remove the  shampoo.                           4.  Use CHG as you would any other liquid soap.  You can apply chg directly  to the skin and wash                       Gently with a scrungie or clean washcloth.  5.  Apply the CHG Soap to your body ONLY FROM THE NECK DOWN.   Do not use on face/ open                           Wound or open sores. Avoid contact with eyes, ears mouth and genitals (private parts).                       Wash face,  Genitals (private parts) with your normal soap.             6.  Wash thoroughly, paying special attention to the area where your surgery  will be performed.  7.  Thoroughly rinse your body with warm water from the neck down.  8.  DO NOT shower/wash with your normal soap after using and rinsing off  the CHG Soap.                9.  Pat yourself dry with a clean towel.            10.  Wear clean pajamas.            11.  Place clean sheets on your bed the night of your first shower and do not  sleep with pets. Day of Surgery : Do not apply any lotions/deodorants the morning of surgery.  Please wear clean clothes to the hospital/surgery center.  FAILURE TO FOLLOW THESE INSTRUCTIONS MAY RESULT IN THE CANCELLATION OF YOUR SURGERY PATIENT SIGNATURE_________________________________  NURSE SIGNATURE__________________________________  ________________________________________________________________________    CLEAR LIQUID DIET   Foods Allowed                                                                     Foods Excluded  Coffee and tea, regular and decaf                              liquids that you cannot  Plain Jell-O in any flavor                                             see through such as: Fruit ices (not with fruit pulp)                                     milk, soups, orange juice  Iced Popsicles                                                   All solid food Carbonated beverages, regular and diet  Cranberry, grape and apple juices Sports drinks like Gatorade Lightly seasoned clear broth or consume(fat free) Sugar, honey syrup   _____________________________________________________________________   WHAT IS A BLOOD TRANSFUSION? Blood Transfusion Information  A transfusion is the replacement of blood or some of its parts. Blood is made up of multiple cells which provide different functions.  Red blood cells carry oxygen and are used for blood loss replacement.  White blood cells fight against infection.  Platelets control bleeding.  Plasma helps clot blood.  Other blood products are available for specialized needs, such as hemophilia or other clotting disorders. BEFORE THE TRANSFUSION  Who gives blood for transfusions?   Healthy volunteers who are fully evaluated to make sure their blood is safe. This is blood bank blood. Transfusion therapy is the safest it has ever been in the practice of medicine. Before blood is taken from a donor, a complete history is taken to make sure that person has no history of diseases nor engages in risky social behavior (examples are intravenous drug use or sexual activity with multiple partners). The donor's travel history is screened to minimize risk of transmitting infections, such as malaria. The donated blood is tested for signs of infectious diseases, such as HIV and hepatitis. The blood is then tested to be sure it is compatible with you in order to minimize the chance of a transfusion reaction. If you or a relative donates blood, this is often done in anticipation of surgery  and is not appropriate for emergency situations. It takes many days to process the donated blood. RISKS AND COMPLICATIONS Although transfusion therapy is very safe and saves many lives, the main dangers of transfusion include:  1. Getting an infectious disease. 2. Developing a transfusion reaction. This is an allergic reaction to something in the blood you were given. Every precaution is taken to prevent this. The decision to have a blood transfusion has been considered carefully by your caregiver before blood is given. Blood is not given unless the benefits outweigh the risks. AFTER THE TRANSFUSION  Right after receiving a blood transfusion, you will usually feel much better and more energetic. This is especially true if your red blood cells have gotten low (anemic). The transfusion raises the level of the red blood cells which carry oxygen, and this usually causes an energy increase.  The nurse administering the transfusion will monitor you carefully for complications. HOME CARE INSTRUCTIONS  No special instructions are needed after a transfusion. You may find your energy is better. Speak with your caregiver about any limitations on activity for underlying diseases you may have. SEEK MEDICAL CARE IF:   Your condition is not improving after your transfusion.  You develop redness or irritation at the intravenous (IV) site. SEEK IMMEDIATE MEDICAL CARE IF:  Any of the following symptoms occur over the next 12 hours:  Shaking chills.  You have a temperature by mouth above 102 F (38.9 C), not controlled by medicine.  Chest, back, or muscle pain.  People around you feel you are not acting correctly or are confused.  Shortness of breath or difficulty breathing.  Dizziness and fainting.  You get a rash or develop hives.  You have a decrease in urine output.  Your urine turns a dark color or changes to pink, red, or brown. Any of the following symptoms occur over the next 10  days:  You have a temperature by mouth above 102 F (38.9 C), not controlled by medicine.  Shortness of breath.  Weakness after  normal activity.  The white part of the eye turns yellow (jaundice).  You have a decrease in the amount of urine or are urinating less often.  Your urine turns a dark color or changes to pink, red, or brown. Document Released: 12/24/1999 Document Revised: 03/20/2011 Document Reviewed: 08/12/2007 ExitCare Patient Information 2014 [place].  _______________________________________________________________________  Incentive Spirometer  An incentive spirometer is a tool that can help keep your lungs clear and active. This tool measures how well you are filling your lungs with each breath. Taking long deep breaths may help reverse or decrease the chance of developing breathing (pulmonary) problems (especially infection) following:  A long period of time when you are unable to move or be active. BEFORE THE PROCEDURE   If the spirometer includes an indicator to show your best effort, your nurse or respiratory therapist will set it to a desired goal.  If possible, sit up straight or lean slightly forward. Try not to slouch.  Hold the incentive spirometer in an upright position. INSTRUCTIONS FOR USE  3. Sit on the edge of your bed if possible, or sit up as far as you can in bed or on a chair. 4. Hold the incentive spirometer in an upright position. 5. Breathe out normally. 6. Place the mouthpiece in your mouth and seal your lips tightly around it. 7. Breathe in slowly and as deeply as possible, raising the piston or the ball toward the top of the column. 8. Hold your breath for 3-5 seconds or for as long as possible. Allow the piston or ball to fall to the bottom of the column. 9. Remove the mouthpiece from your mouth and breathe out normally. 10. Rest for a few seconds and repeat Steps 1 through 7 at least 10 times every 1-2 hours when you are awake.  Take your time and take a few normal breaths between deep breaths. 11. The spirometer may include an indicator to show your best effort. Use the indicator as a goal to work toward during each repetition. 12. After each set of 10 deep breaths, practice coughing to be sure your lungs are clear. If you have an incision (the cut made at the time of surgery), support your incision when coughing by placing a pillow or rolled up towels firmly against it. Once you are able to get out of bed, walk around indoors and cough well. You may stop using the incentive spirometer when instructed by your caregiver.  RISKS AND COMPLICATIONS  Take your time so you do not get dizzy or light-headed.  If you are in pain, you may need to take or ask for pain medication before doing incentive spirometry. It is harder to take a deep breath if you are having pain. AFTER USE  Rest and breathe slowly and easily.  It can be helpful to keep track of a log of your progress. Your caregiver can provide you with a simple table to help with this. If you are using the spirometer at home, follow these instructions: [place] IF:   You are having difficultly using the spirometer.  You have trouble using the spirometer as often as instructed.  Your pain medication is not giving enough relief while using the spirometer.  You develop fever of 100.5 F (38.1 C) or higher. SEEK IMMEDIATE MEDICAL CARE IF:   You cough up bloody sputum that had not been present before.  You develop fever of 102 F (38.9 C) or greater.  You develop worsening pain at  or near the incision site. MAKE SURE YOU:   Understand these instructions.  Will watch your condition.  Will get help right away if you are not doing well or get worse. Document Released: 05/08/2006 Document Revised: 03/20/2011 Document Reviewed: 07/09/2006 [facility] Patient Information 2014 [place],  [place].   ________________________________________________________________________

## 2014-06-10 ENCOUNTER — Encounter: Payer: Self-pay | Admitting: Infectious Diseases

## 2014-06-10 ENCOUNTER — Encounter (HOSPITAL_COMMUNITY)
Admission: RE | Admit: 2014-06-10 | Discharge: 2014-06-10 | Disposition: A | Discharge status: Home / Self Care | Payer: No Typology Code available for payment source | Source: Ambulatory Visit | Attending: Orthopedic Surgery | Admitting: Orthopedic Surgery

## 2014-06-10 ENCOUNTER — Ambulatory Visit (INDEPENDENT_AMBULATORY_CARE_PROVIDER_SITE_OTHER): Payer: No Typology Code available for payment source | Admitting: Infectious Diseases

## 2014-06-10 ENCOUNTER — Encounter (HOSPITAL_COMMUNITY): Payer: Self-pay

## 2014-06-10 VITALS — BP 130/83 | HR 80 | Temp 98.5°F | Wt 147.8 lb

## 2014-06-10 DIAGNOSIS — K029 Dental caries, unspecified: Secondary | ICD-10-CM | POA: Diagnosis not present

## 2014-06-10 DIAGNOSIS — F17200 Nicotine dependence, unspecified, uncomplicated: Secondary | ICD-10-CM

## 2014-06-10 DIAGNOSIS — L89152 Pressure ulcer of sacral region, stage 2: Secondary | ICD-10-CM

## 2014-06-10 DIAGNOSIS — M009 Pyogenic arthritis, unspecified: Secondary | ICD-10-CM | POA: Diagnosis not present

## 2014-06-10 DIAGNOSIS — N179 Acute kidney failure, unspecified: Secondary | ICD-10-CM | POA: Insufficient documentation

## 2014-06-10 DIAGNOSIS — B20 Human immunodeficiency virus [HIV] disease: Secondary | ICD-10-CM | POA: Diagnosis not present

## 2014-06-10 DIAGNOSIS — Z01812 Encounter for preprocedural laboratory examination: Secondary | ICD-10-CM | POA: Insufficient documentation

## 2014-06-10 DIAGNOSIS — N182 Chronic kidney disease, stage 2 (mild): Secondary | ICD-10-CM

## 2014-06-10 DIAGNOSIS — Z720 Tobacco use: Secondary | ICD-10-CM

## 2014-06-10 DIAGNOSIS — N359 Urethral stricture, unspecified: Secondary | ICD-10-CM

## 2014-06-10 DIAGNOSIS — N35919 Unspecified urethral stricture, male, unspecified site: Secondary | ICD-10-CM

## 2014-06-10 DIAGNOSIS — N189 Chronic kidney disease, unspecified: Secondary | ICD-10-CM | POA: Insufficient documentation

## 2014-06-10 HISTORY — DX: Contact with and (suspected) exposure to viral hepatitis: Z20.5

## 2014-06-10 HISTORY — DX: Personal history of other specified conditions: Z87.898

## 2014-06-10 HISTORY — DX: Pressure ulcer of sacral region, unspecified stage: L89.159

## 2014-06-10 HISTORY — DX: Personal history of other infectious and parasitic diseases: Z86.19

## 2014-06-10 HISTORY — DX: Personal history of other diseases of the musculoskeletal system and connective tissue: Z87.39

## 2014-06-10 HISTORY — DX: Unspecified osteoarthritis, unspecified site: M19.90

## 2014-06-10 HISTORY — DX: Personal history of other diseases of the circulatory system: Z86.79

## 2014-06-10 HISTORY — DX: Personal history of other medical treatment: Z92.89

## 2014-06-10 LAB — BASIC METABOLIC PANEL
Anion gap: 10 (ref 5–15)
BUN: 17 mg/dL (ref 6–20)
CALCIUM: 9.9 mg/dL (ref 8.9–10.3)
CHLORIDE: 110 mmol/L (ref 101–111)
CO2: 22 mmol/L (ref 22–32)
CREATININE: 1.45 mg/dL — AB (ref 0.61–1.24)
GFR calc non Af Amer: 54 mL/min — ABNORMAL LOW (ref >60)
GLUCOSE: 112 mg/dL — AB (ref 65–99)
POTASSIUM: 3.6 mmol/L (ref 3.5–5.1)
SODIUM: 142 mmol/L (ref 135–145)

## 2014-06-10 LAB — URINALYSIS, ROUTINE W REFLEX MICROSCOPIC
BILIRUBIN URINE: NEGATIVE
GLUCOSE, UA: NEGATIVE mg/dL
Hgb urine dipstick: NEGATIVE
Ketones, ur: NEGATIVE mg/dL
LEUKOCYTES UA: NEGATIVE
NITRITE: NEGATIVE
PH: 5 (ref 5.0–8.0)
Protein, ur: NEGATIVE mg/dL
Specific Gravity, Urine: 1.02 (ref 1.005–1.030)
Urobilinogen, UA: 0.2 mg/dL (ref 0.0–1.0)

## 2014-06-10 LAB — CBC
HEMATOCRIT: 39.7 % (ref 39.0–52.0)
Hemoglobin: 13 g/dL (ref 13.0–17.0)
MCH: 31 pg (ref 26.0–34.0)
MCHC: 32.7 g/dL (ref 30.0–36.0)
MCV: 94.5 fL (ref 78.0–100.0)
Platelets: 183 10*3/uL (ref 150–400)
RBC: 4.2 MIL/uL — ABNORMAL LOW (ref 4.22–5.81)
RDW: 14.5 % (ref 11.5–15.5)
WBC: 5.3 10*3/uL (ref 4.0–10.5)

## 2014-06-10 LAB — PROTIME-INR
INR: 1.04 (ref 0.00–1.49)
Prothrombin Time: 13.8 seconds (ref 11.6–15.2)

## 2014-06-10 LAB — SURGICAL PCR SCREEN
MRSA, PCR: NEGATIVE
STAPHYLOCOCCUS AUREUS: NEGATIVE

## 2014-06-10 LAB — APTT: aPTT: 28 seconds (ref 24–37)

## 2014-06-10 NOTE — Assessment & Plan Note (Signed)
He is to have dental removal of all his teeth.

## 2014-06-10 NOTE — Progress Notes (Signed)
   Subjective:    Patient ID: JAICE DIGIOIA, male    DOB: [birth year], 54 y.o.   MRN: [id number]  HPI 54 yo M with HIV/AIDS, tobacco use, and anal CA (2008). Has been doing well. No problems with meds- ATVr/TFV/CBV (prev been on EFV, KLT), bactrim.  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 He was septic, requiring dopamine initially. He underwent resection of his R hip and debridement of his decubitus ulcer. His Cx grew group B Strep. He was d/c to [facility] to complete 6 weeks of ceftriaxone.  He was at [facility] 9-25 to 11-6. He states he did not have home care after d/c. States his wound has been improving. Getting help from his sister.   He had TRP Jan 2016 and since had problems with retention and urinary stone. This has since improved.   Has been seen by Dr Migdalia Dk for his wound f/u. This is nearly healed.  At his last surgery he had episode of hypotension and was started on midodrine. He has been off this for a week and his BP have been normal.  He was seen in f/u 12-01-13 and changed to Prezcobix/RPV/DTGV qday.  He is planning to have R THR 06-15-14.   HIV 1 RNA QUANT (copies/mL)  Date Value  05/06/2014 271*  02/02/2014 4947*  11/05/2013 10279*   CD4 T CELL ABS (/uL)  Date Value  05/06/2014 320*  02/02/2014 310*  11/05/2013 290*   States he has one missed date of DRVc.  Can walk around his house for 1.5 hours. States when he is out he getting vertiginous when he is around crowds.    Review of Systems  Gastrointestinal: Negative for diarrhea and constipation.  Genitourinary: Negative for difficulty urinating.       Objective:   Physical Exam  Constitutional: He appears well-developed and well-nourished.  HENT:  Mouth/Throat: No oropharyngeal exudate.  Eyes: EOM are normal. Pupils are equal, round, and reactive to light.  Neck: Neck supple.  Cardiovascular: Normal rate, regular rhythm and normal heart sounds.     Pulmonary/Chest: Effort normal and breath sounds normal.  Abdominal: Soft. Bowel sounds are normal. There is no tenderness.  Musculoskeletal:       Arms: Lymphadenopathy:    He has no cervical adenopathy.          Assessment & Plan:

## 2014-06-10 NOTE — Assessment & Plan Note (Signed)
Has quit smoking. Will Reade as resolved.

## 2014-06-10 NOTE — Progress Notes (Signed)
Subjective:    Patient ID: [name] [name], male    DOB: 1961/01/07, 54 y.o.   MRN: [id number]  HPI [name] [name] is here for HIV follow up and preop clearance. [name] [name] is schedule for surgery on Monday for hardware replacement. His current VL is 271 and cd4 of 320. [name] [name] continues to take Prezcobix, Tivicay and Edurant every day. [name] [name] denies any side effects from the medications. [name] [name] did follow up with dentistry and they recommend that they pull some of his teeth to put in partials. [name] [name] denies any fever, chills, night sweats. [name] [name] continues on his doxy and cipro. No c/o diarrhea. Urology recently did a cystoscopy and found stones. [name] [name] is being managed for this.    Outpatient Encounter Prescriptions as of 06/10/2014  Medication Sig Note  . ciprofloxacin (CIPRO) 500 MG tablet Take 500 mg by mouth 2 (two) times daily. 06/04/2014: continuous  . darunavir-cobicistat (PREZCOBIX) 800-150 MG per tablet Take 1 tablet by mouth daily. Swallow whole. Do NOT crush, break or chew tablets. Take with food.   . dolutegravir (TIVICAY) 50 MG tablet Take 1 tablet (50 mg total) by mouth daily.   Marland Kitchen doxycycline (VIBRAMYCIN) 100 MG capsule Take 100 mg by mouth 2 (two) times daily. 06/04/2014: continuous  . feeding supplement, ENSURE COMPLETE, (ENSURE COMPLETE) LIQD Take 237 mLs by mouth daily.   Marland Kitchen ibuprofen (ADVIL,MOTRIN) 200 MG tablet Take 400 mg by mouth every 6 (six) hours as needed for mild pain.   Marland Kitchen rilpivirine (EDURANT) 25 MG TABS tablet Take 1 tablet (25 mg total) by mouth daily with breakfast.   . senna-docusate (SENNA PLUS) 8.6-50 MG per tablet Take 1 tablet by mouth 2 (two) times daily as needed for mild constipation.   . vitamin C (ASCORBIC ACID) 500 MG tablet Take 500 mg by mouth daily.   . midodrine (PROAMATINE) 10 MG tablet Take 1 tablet (10 mg total) by mouth 3 (three) times daily with meals. (Patient not taking: Reported on 06/10/2014)    No facility-administered encounter medications on file as of 06/10/2014.    Review  of Systems  Constitutional: Negative for fever, chills, diaphoresis, fatigue and unexpected weight change.  HENT: Positive for dental problem. Negative for mouth sores and sore throat.   Eyes: Negative for visual disturbance.  Respiratory: Negative for cough and shortness of breath.   Cardiovascular: Negative for leg swelling.  Gastrointestinal: Negative for abdominal pain, diarrhea and constipation.  Genitourinary: Negative for difficulty urinating.  Musculoskeletal: Negative for myalgias and joint swelling.  Skin: Positive for wound.  Neurological: Positive for weakness. Negative for speech difficulty.  Hematological: Negative for adenopathy.  Psychiatric/Behavioral: Negative for behavioral problems and sleep disturbance.       Objective:   Physical Exam  Constitutional: [name] [name] is oriented to person, place, and time. [name] [name] appears well-developed and well-nourished.  HENT:  Head: Normocephalic.  Mouth/Throat: Oropharynx is clear and moist. No oropharyngeal exudate.  Eyes: Pupils are equal, round, and reactive to light.  Neck: Normal range of motion. Neck supple.  Cardiovascular: Normal rate and regular rhythm.   Pulmonary/Chest: Effort normal and breath sounds normal.  Abdominal: Soft. Bowel sounds are normal.  Lymphadenopathy:    [name] [name] has no cervical adenopathy.  Neurological: [name] [name] is alert and oriented to person, place, and time.  Skin: Skin is warm and dry.  Sacral ulcer healing well. No open sores.   Psychiatric: [name] [name] has a normal mood and affect. His behavior is normal.    Blood pressure 130/83, pulse 80,  temperature 98.5 F (36.9 C), temperature source Oral, weight 147 lb 12 oz (67.019 kg). Lab Results  Component Value Date   HIV1RNAQUANT 271* 05/06/2014   Lab Results  Component Value Date   CD4TABS 320* 05/06/2014   CD4TABS 310* 02/02/2014   CD4TABS 290* 11/05/2013         Assessment & Plan:

## 2014-06-10 NOTE — Assessment & Plan Note (Signed)
Appears improved. Will watch.

## 2014-06-10 NOTE — Assessment & Plan Note (Signed)
Significantly improved. Continue local wound care. Has been d/c by Dr Migdalia Dk.

## 2014-06-10 NOTE — Progress Notes (Signed)
Abnormal BMET faxed to Dr. [name]

## 2014-06-10 NOTE — Assessment & Plan Note (Signed)
Will check his CD4 and HIV RNA today.  From my pesrpective he is medically optimized. i have asked him to alert anesthesia about his hx of taking midodrine. His Cr is at baseline. His CD4 is increased, his VL is still detectable but lower than it has been for some time.

## 2014-06-10 NOTE — Assessment & Plan Note (Signed)
Appears stable, will continue to watch.

## 2014-06-12 LAB — HIV-1 RNA ULTRAQUANT REFLEX TO GENTYP+
HIV 1 RNA Quant: 1762 copies/mL — ABNORMAL HIGH (ref <20)
HIV-1 RNA Quant, Log: 3.25 {Log} — ABNORMAL HIGH (ref <1.30)

## 2014-06-12 LAB — T-HELPER CELL (CD4) - (RCID CLINIC ONLY)
CD4 % Helper T Cell: 18 % — ABNORMAL LOW (ref 33–55)
CD4 T Cell Abs: 360 /uL — ABNORMAL LOW (ref 400–2700)

## 2014-06-14 NOTE — H&P (Signed)
[name] [name] is an 54 y.o. male.    Procedure:  Resection of antibiotic spacer and implantation of right total hip arthroplasty  Chief Complaint:   S/P resection of an infected native right hip joint related to his underlying medical comorbidities, placement of a Prostalac temporary spacer  HPI: [name] [name] is a 54 year old male with multiple medical issues including HIV with natively infected right hip joint that was resected and placement of a temporary antibiotic spacer with Prostalac and antibiotics. He has also had an associated sacral decubitus that has been treated and managed through Dr. Theodoro Kos. Dr. [name] have spoken to her regarding his case and she feels at this point, he is cleared for surgery. He is scheduled for surgery on the 6 of June currently. Dr. [name] did speak to Dr. Migdalia Dk about his overall health and she had suggested that we evaluate his nutritional status by evaluating the albumin level. We had performed that and it was within normal limits.  He is seen and evaluated in the office. He still uses assisted device for mobilization. He tolerated the hip range of motion well, but does have some discomfort to palpation about the right hip area. No signs of any fluctuance or infection. The sacral wound is healing with small dressing over top of it noted without complication.  I have reviewed [name]'s current situation. His albumin level was within normal limits as identified through lab work. His sedimentation rate and C-reactive protein have come down appropriate. At this point, I feel that it is appropriate based on our discussion with Dr. Migdalia Dk, to proceed with re-implantation of his right hip, scheduling on June 6.  Risks, benefits and expectations were discussed with the patient.  Risks including but not limited to the risk of anesthesia, blood clots, nerve damage, blood vessel damage, failure of the prosthesis, infection and up to and including death.  Patient understand  the risks, benefits and expectations and wishes to proceed with surgery.    PCP: No primary care provider on file.  D/C Plans:      Home with HHPT/SNF  Post-op Meds:       No Rx given  Tranexamic Acid:      To be given - IV  Decadron:      is to be given  FYI:     ASA post-op  Norco post-op   PMH: Past Medical History  Diagnosis Date  . Radiation   . HIV (human immunodeficiency virus infection)   . Shingles     left shoulder, right leg  . Pain in limb 07/23/2013  . Rectal cancer     Squamous cell  . H/O hypotension   . Arthritis   . History of gout   . Hx of sepsis 2015    affected rt hip / femur  . Exposure to hepatitis B   . 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 History of transfusion   . Decubitus ulcer of sacral area     PSH: Past Surgical History  Procedure Laterality Date  . Hematoma evacuation    . Incision and drainage hip Right 07/31/2013    Procedure: IRRIGATION AND DEBRIDEMENT HIP WITH PERCUTANEOUS DRAIN PLACEMENT;  Surgeon: [name], MD;  Location: [facility];  Service: Orthopedics;  Laterality: Right;  . Total hip arthroplasty Right 08/04/2013    Procedure: RESECTION HIP JOINT - PLACEMENT OF CEMENT PROSTHESIS;  Surgeon: [name], MD;  Location:  [facility];  Service: Orthopedics;  Laterality: Right;  . Pilonidal cyst drainage N/A 08/09/2013    Procedure: IRRIGATION AND DEBRIDEMENT SACRAL DECUBITUS;  Surgeon: Harl Bowie, MD;  Location: [facility];  Service: General;  Laterality: N/A;  . Incision and drainage of wound N/A 08/25/2013    Procedure: IRRIGATION AND DEBRIDEMENT OF SACRAL ULCER WITH PLACEMENT OF A CELL ;  Surgeon: Theodoro Kos, DO;  Location: [facility];  Service: Plastics;  Laterality: N/A;  . Incision and drainage of wound N/A 09/01/2013    Procedure: IRRIGATION AND DEBRIDEMENT SACRAL ULCER WITH PLACEMENT OF A CELL;  Surgeon: Theodoro Kos, DO;  Location: [facility];  Service: Plastics;  Laterality: N/A;  . Incision and drainage of  wound N/A 09/08/2013    Procedure: IRRIGATION AND DEBRIDEMENT OF SACRAL ULCER WITH PLACEMENT OF A CELL AND VAC;  Surgeon: Theodoro Kos, DO;  Location: [facility];  Service: Plastics;  Laterality: N/A;  . Incision and drainage of wound N/A 09/18/2013    Procedure: IRRIGATION AND DEBRIDEMENT OF SACRAL WOUND WITH PLACEMENT OF A-CELL;  Surgeon: Theodoro Kos, DO;  Location: [facility];  Service: Plastics;  Laterality: N/A;  . Hernia repair      right X2  . Transurethral resection of prostate  JAN 2016  . Cystoscopy      Social History:  reports that he quit smoking about 11 months ago. His smoking use included Cigarettes. He smoked 0.30 packs per day. He has never used smokeless tobacco. He reports that he does not drink alcohol or use illicit drugs.  Allergies:  Allergies  Allergen Reactions  . Collagen     redness    Medications: No current facility-administered medications for this encounter.   Current Outpatient Prescriptions  Medication Sig Dispense Refill  . ciprofloxacin (CIPRO) 500 MG tablet Take 500 mg by mouth 2 (two) times daily.    . darunavir-cobicistat (PREZCOBIX) 800-150 MG per tablet Take 1 tablet by mouth daily. Swallow whole. Do NOT crush, break or chew tablets. Take with food. 30 tablet 3  . dolutegravir (TIVICAY) 50 MG tablet Take 1 tablet (50 mg total) by mouth daily. 90 tablet 3  . doxycycline (VIBRAMYCIN) 100 MG capsule Take 100 mg by mouth 2 (two) times daily.    . feeding supplement, ENSURE COMPLETE, (ENSURE COMPLETE) LIQD Take 237 mLs by mouth daily.    Marland Kitchen ibuprofen (ADVIL,MOTRIN) 200 MG tablet Take 400 mg by mouth every 6 (six) hours as needed for mild pain.    . midodrine (PROAMATINE) 10 MG tablet Take 1 tablet (10 mg total) by mouth 3 (three) times daily with meals. (Patient not taking: Reported on 06/10/2014)    . rilpivirine (EDURANT) 25 MG TABS tablet Take 1 tablet (25 mg total) by mouth daily with breakfast. 90 tablet 3  . senna-docusate (SENNA PLUS) 8.6-50 MG per  tablet Take 1 tablet by mouth 2 (two) times daily as needed for mild constipation.    . vitamin C (ASCORBIC ACID) 500 MG tablet Take 500 mg by mouth daily.       Review of Systems  Constitutional: Negative.   HENT: Negative.   Eyes: Negative.   Respiratory: Negative.   Cardiovascular: Negative.   Gastrointestinal: Negative.   Genitourinary: Negative.   Musculoskeletal: Positive for joint pain.  Skin: Negative.   Neurological: Negative.   Endo/Heme/Allergies: Negative.   Psychiatric/Behavioral: Negative.        Physical Exam  Constitutional: He is oriented to person, place, and time. He appears well-developed.  HENT:  Head: Normocephalic.  Eyes: Pupils are equal, round, and reactive to light.  Neck: Neck supple. No JVD present. No tracheal deviation present. No thyromegaly present.  Cardiovascular: Normal rate, regular rhythm and intact distal pulses.   Respiratory: Effort normal and breath sounds normal. No respiratory distress.  GI: Soft. There is no tenderness. There is no guarding.  Musculoskeletal:       Right hip: He exhibits decreased range of motion, decreased strength, tenderness, bony tenderness and laceration (healed from previous incision). He exhibits no swelling and no deformity.  Lymphadenopathy:    He has no cervical adenopathy.  Neurological: He is alert and oriented to person, place, and time.  Skin: Skin is warm and dry.  Psychiatric: He has a normal mood and affect.        Assessment/Plan Assessment:    S/P resection of an infected native right hip joint related to his underlying medical comorbidities, placement of a Prostalac temporary spacer  Plan: Patient will undergo a resection of antibiotic spacer and implantation of total hip arthroplasty on 06/15/2014 per Dr. [name] at [facility]. Risks benefits and expectations were discussed with the patient. Patient understand risks, benefits and expectations and wishes to proceed.     West Pugh Murl Golladay   PA-C  06/14/2014, 2:11 PM

## 2014-06-15 ENCOUNTER — Inpatient Hospital Stay (HOSPITAL_COMMUNITY): Payer: No Typology Code available for payment source

## 2014-06-15 ENCOUNTER — Encounter (HOSPITAL_COMMUNITY)
Admission: RE | Disposition: A | Discharge status: Home Health | Payer: Self-pay | Source: Ambulatory Visit | Attending: Orthopedic Surgery

## 2014-06-15 ENCOUNTER — Inpatient Hospital Stay (HOSPITAL_COMMUNITY): Payer: No Typology Code available for payment source | Admitting: Anesthesiology

## 2014-06-15 ENCOUNTER — Inpatient Hospital Stay (HOSPITAL_COMMUNITY)
Admission: RE | Admit: 2014-06-15 | Discharge: 2014-06-17 | DRG: 969 | Disposition: A | Discharge status: Home Health | Payer: No Typology Code available for payment source | Source: Ambulatory Visit | Attending: Orthopedic Surgery | Admitting: Orthopedic Surgery

## 2014-06-15 ENCOUNTER — Encounter (HOSPITAL_COMMUNITY): Payer: Self-pay | Admitting: *Deleted

## 2014-06-15 DIAGNOSIS — Z792 Long term (current) use of antibiotics: Secondary | ICD-10-CM | POA: Diagnosis not present

## 2014-06-15 DIAGNOSIS — M009 Pyogenic arthritis, unspecified: Principal | ICD-10-CM | POA: Diagnosis present

## 2014-06-15 DIAGNOSIS — Z9079 Acquired absence of other genital organ(s): Secondary | ICD-10-CM | POA: Diagnosis present

## 2014-06-15 DIAGNOSIS — B20 Human immunodeficiency virus [HIV] disease: Secondary | ICD-10-CM | POA: Diagnosis present

## 2014-06-15 DIAGNOSIS — Z79899 Other long term (current) drug therapy: Secondary | ICD-10-CM | POA: Diagnosis not present

## 2014-06-15 DIAGNOSIS — Z888 Allergy status to other drugs, medicaments and biological substances status: Secondary | ICD-10-CM | POA: Diagnosis not present

## 2014-06-15 DIAGNOSIS — Z87891 Personal history of nicotine dependence: Secondary | ICD-10-CM | POA: Diagnosis not present

## 2014-06-15 DIAGNOSIS — Z23 Encounter for immunization: Secondary | ICD-10-CM

## 2014-06-15 DIAGNOSIS — Z96649 Presence of unspecified artificial hip joint: Secondary | ICD-10-CM

## 2014-06-15 DIAGNOSIS — Z85048 Personal history of other malignant neoplasm of rectum, rectosigmoid junction, and anus: Secondary | ICD-10-CM

## 2014-06-15 HISTORY — PX: CONVERSION TO TOTAL HIP: SHX5784

## 2014-06-15 LAB — TYPE AND SCREEN
ABO/RH(D): B POS
Antibody Screen: NEGATIVE

## 2014-06-15 SURGERY — CONVERSION, PREVIOUS HIP SURGERY, TO TOTAL HIP ARTHROPLASTY
Anesthesia: General | Site: Hip | Laterality: Right

## 2014-06-15 MED ORDER — CEFAZOLIN SODIUM-DEXTROSE 2-3 GM-% IV SOLR
2.0000 g | INTRAVENOUS | Status: AC
  Administered 2014-06-15: 2 g via INTRAVENOUS

## 2014-06-15 MED ORDER — METHOCARBAMOL 500 MG PO TABS
500.0000 mg | ORAL_TABLET | Freq: Four times a day (QID) | ORAL | Status: DC | PRN
  Administered 2014-06-16 – 2014-06-17 (×2): 500 mg via ORAL
  Filled 2014-06-15 (×2): qty 1

## 2014-06-15 MED ORDER — BISACODYL 10 MG RE SUPP: 10.0000 mg | Freq: Every day | RECTAL | Status: DC | PRN

## 2014-06-15 MED ORDER — ONDANSETRON HCL 4 MG/2ML IJ SOLN: 4.0000 mg | Freq: Four times a day (QID) | INTRAMUSCULAR | Status: DC | PRN

## 2014-06-15 MED ORDER — PROPOFOL 10 MG/ML IV BOLUS
INTRAVENOUS | Status: AC
  Filled 2014-06-15: qty 20

## 2014-06-15 MED ORDER — PROPOFOL 10 MG/ML IV BOLUS
INTRAVENOUS | Status: DC | PRN
  Administered 2014-06-15: 200 mg via INTRAVENOUS

## 2014-06-15 MED ORDER — TRANEXAMIC ACID 1000 MG/10ML IV SOLN
1000.0000 mg | Freq: Once | INTRAVENOUS | Status: AC
  Administered 2014-06-15: 1000 mg via INTRAVENOUS
  Filled 2014-06-15: qty 10

## 2014-06-15 MED ORDER — HYDROMORPHONE HCL 1 MG/ML IJ SOLN
INTRAMUSCULAR | Status: AC
  Filled 2014-06-15: qty 1

## 2014-06-15 MED ORDER — MENTHOL 3 MG MT LOZG: 1.0000 | LOZENGE | OROMUCOSAL | Status: DC | PRN

## 2014-06-15 MED ORDER — CEFAZOLIN SODIUM-DEXTROSE 2-3 GM-% IV SOLR
INTRAVENOUS | Status: AC
  Filled 2014-06-15: qty 50

## 2014-06-15 MED ORDER — MIDODRINE HCL 5 MG PO TABS
10.0000 mg | ORAL_TABLET | Freq: Three times a day (TID) | ORAL | Status: DC
  Administered 2014-06-16 – 2014-06-17 (×5): 10 mg via ORAL
  Filled 2014-06-15 (×7): qty 2

## 2014-06-15 MED ORDER — ALUM & MAG HYDROXIDE-SIMETH 200-200-20 MG/5ML PO SUSP: 30.0000 mL | ORAL | Status: DC | PRN

## 2014-06-15 MED ORDER — PHENOL 1.4 % MT LIQD: 1.0000 | OROMUCOSAL | Status: DC | PRN

## 2014-06-15 MED ORDER — METHOCARBAMOL 1000 MG/10ML IJ SOLN
500.0000 mg | Freq: Four times a day (QID) | INTRAVENOUS | Status: DC | PRN
  Administered 2014-06-15: 500 mg via INTRAVENOUS
  Filled 2014-06-15 (×2): qty 5

## 2014-06-15 MED ORDER — MIDAZOLAM HCL 2 MG/2ML IJ SOLN
INTRAMUSCULAR | Status: AC
  Filled 2014-06-15: qty 2

## 2014-06-15 MED ORDER — FENTANYL CITRATE (PF) 100 MCG/2ML IJ SOLN
INTRAMUSCULAR | Status: AC
  Filled 2014-06-15: qty 2

## 2014-06-15 MED ORDER — CELECOXIB 200 MG PO CAPS
200.0000 mg | ORAL_CAPSULE | Freq: Two times a day (BID) | ORAL | Status: DC
  Administered 2014-06-15 – 2014-06-17 (×4): 200 mg via ORAL
  Filled 2014-06-15 (×6): qty 1

## 2014-06-15 MED ORDER — MAGNESIUM CITRATE PO SOLN: 1.0000 | Freq: Once | ORAL | Status: AC | PRN

## 2014-06-15 MED ORDER — FENTANYL CITRATE (PF) 250 MCG/5ML IJ SOLN
INTRAMUSCULAR | Status: AC
  Filled 2014-06-15: qty 5

## 2014-06-15 MED ORDER — FENTANYL CITRATE (PF) 100 MCG/2ML IJ SOLN
INTRAMUSCULAR | Status: DC | PRN
  Administered 2014-06-15: 100 ug via INTRAVENOUS
  Administered 2014-06-15 (×2): 50 ug via INTRAVENOUS
  Administered 2014-06-15: 100 ug via INTRAVENOUS
  Administered 2014-06-15: 50 ug via INTRAVENOUS

## 2014-06-15 MED ORDER — LACTATED RINGERS IV SOLN
INTRAVENOUS | Status: DC
  Administered 2014-06-15 (×2): via INTRAVENOUS
  Administered 2014-06-15: 1000 mL via INTRAVENOUS

## 2014-06-15 MED ORDER — ASPIRIN EC 325 MG PO TBEC
325.0000 mg | DELAYED_RELEASE_TABLET | Freq: Two times a day (BID) | ORAL | Status: DC
  Administered 2014-06-16 – 2014-06-17 (×3): 325 mg via ORAL
  Filled 2014-06-15 (×5): qty 1

## 2014-06-15 MED ORDER — DOLUTEGRAVIR SODIUM 50 MG PO TABS
50.0000 mg | ORAL_TABLET | Freq: Every day | ORAL | Status: DC
  Administered 2014-06-15 – 2014-06-16 (×2): 50 mg via ORAL
  Filled 2014-06-15 (×3): qty 1

## 2014-06-15 MED ORDER — DOCUSATE SODIUM 100 MG PO CAPS
100.0000 mg | ORAL_CAPSULE | Freq: Two times a day (BID) | ORAL | Status: DC
  Administered 2014-06-15 – 2014-06-17 (×4): 100 mg via ORAL

## 2014-06-15 MED ORDER — SODIUM CHLORIDE 0.9 % IR SOLN
Status: DC | PRN
  Administered 2014-06-15: 3000 mL

## 2014-06-15 MED ORDER — HYDROMORPHONE HCL 1 MG/ML IJ SOLN
0.2500 mg | INTRAMUSCULAR | Status: DC | PRN
  Administered 2014-06-15 (×4): 0.5 mg via INTRAVENOUS

## 2014-06-15 MED ORDER — SODIUM CHLORIDE 0.9 % IV SOLN
100.0000 mL/h | INTRAVENOUS | Status: DC
  Administered 2014-06-15 – 2014-06-16 (×2): 100 mL/h via INTRAVENOUS
  Filled 2014-06-15 (×6): qty 1000

## 2014-06-15 MED ORDER — CEFAZOLIN SODIUM-DEXTROSE 2-3 GM-% IV SOLR
2.0000 g | Freq: Four times a day (QID) | INTRAVENOUS | Status: AC
  Administered 2014-06-15 – 2014-06-16 (×2): 2 g via INTRAVENOUS
  Filled 2014-06-15 (×2): qty 50

## 2014-06-15 MED ORDER — METOCLOPRAMIDE HCL 10 MG PO TABS
5.0000 mg | ORAL_TABLET | Freq: Three times a day (TID) | ORAL | Status: DC | PRN
Start: 2014-06-15 — End: 2014-06-17

## 2014-06-15 MED ORDER — MIDAZOLAM HCL 5 MG/5ML IJ SOLN
INTRAMUSCULAR | Status: DC | PRN
  Administered 2014-06-15: 2 mg via INTRAVENOUS

## 2014-06-15 MED ORDER — NEOSTIGMINE METHYLSULFATE 10 MG/10ML IV SOLN
INTRAVENOUS | Status: DC | PRN
  Administered 2014-06-15: 2 mg via INTRAVENOUS

## 2014-06-15 MED ORDER — PHENYLEPHRINE HCL 10 MG/ML IJ SOLN
INTRAMUSCULAR | Status: DC | PRN
  Administered 2014-06-15 (×3): 80 ug via INTRAVENOUS

## 2014-06-15 MED ORDER — DEXAMETHASONE SODIUM PHOSPHATE 10 MG/ML IJ SOLN
INTRAMUSCULAR | Status: DC | PRN
  Administered 2014-06-15: 10 mg via INTRAVENOUS

## 2014-06-15 MED ORDER — HYDROMORPHONE HCL 2 MG/ML IJ SOLN
INTRAMUSCULAR | Status: AC
  Filled 2014-06-15: qty 1

## 2014-06-15 MED ORDER — HYDROMORPHONE HCL 1 MG/ML IJ SOLN
INTRAMUSCULAR | Status: DC | PRN
  Administered 2014-06-15 (×2): 1 mg via INTRAVENOUS

## 2014-06-15 MED ORDER — DEXAMETHASONE SODIUM PHOSPHATE 10 MG/ML IJ SOLN
10.0000 mg | Freq: Once | INTRAMUSCULAR | Status: AC
Start: 2014-06-16 — End: 2014-06-16
  Administered 2014-06-16: 10 mg via INTRAVENOUS
  Filled 2014-06-15: qty 1

## 2014-06-15 MED ORDER — ROCURONIUM BROMIDE 100 MG/10ML IV SOLN
INTRAVENOUS | Status: DC | PRN
  Administered 2014-06-15: 60 mg via INTRAVENOUS

## 2014-06-15 MED ORDER — 0.9 % SODIUM CHLORIDE (POUR BTL) OPTIME
TOPICAL | Status: DC | PRN
  Administered 2014-06-15: 1000 mL

## 2014-06-15 MED ORDER — FERROUS SULFATE 325 (65 FE) MG PO TABS
325.0000 mg | ORAL_TABLET | Freq: Three times a day (TID) | ORAL | Status: DC
  Administered 2014-06-16 – 2014-06-17 (×4): 325 mg via ORAL
  Filled 2014-06-15 (×7): qty 1

## 2014-06-15 MED ORDER — DARUNAVIR-COBICISTAT 800-150 MG PO TABS
1.0000 | ORAL_TABLET | Freq: Every day | ORAL | Status: DC
  Administered 2014-06-15 – 2014-06-16 (×2): 1 via ORAL
  Filled 2014-06-15 (×3): qty 1

## 2014-06-15 MED ORDER — POLYETHYLENE GLYCOL 3350 17 G PO PACK
17.0000 g | PACK | Freq: Two times a day (BID) | ORAL | Status: DC
  Administered 2014-06-16 – 2014-06-17 (×3): 17 g via ORAL

## 2014-06-15 MED ORDER — RILPIVIRINE HCL 25 MG PO TABS
25.0000 mg | ORAL_TABLET | Freq: Every day | ORAL | Status: DC
  Administered 2014-06-15 – 2014-06-16 (×2): 25 mg via ORAL
  Filled 2014-06-15 (×3): qty 1

## 2014-06-15 MED ORDER — HYDROMORPHONE HCL 1 MG/ML IJ SOLN: 0.5000 mg | INTRAMUSCULAR | Status: DC | PRN

## 2014-06-15 MED ORDER — GLYCOPYRROLATE 0.2 MG/ML IJ SOLN
INTRAMUSCULAR | Status: DC | PRN
Start: 2014-06-15 — End: 2014-06-15
  Administered 2014-06-15: 0.2 mg via INTRAVENOUS

## 2014-06-15 MED ORDER — HYDROCODONE-ACETAMINOPHEN 7.5-325 MG PO TABS
1.0000 | ORAL_TABLET | ORAL | Status: DC
  Administered 2014-06-15 – 2014-06-17 (×11): 2 via ORAL
  Filled 2014-06-15 (×11): qty 2

## 2014-06-15 MED ORDER — DIPHENHYDRAMINE HCL 25 MG PO CAPS: 25.0000 mg | ORAL_CAPSULE | Freq: Four times a day (QID) | ORAL | Status: DC | PRN

## 2014-06-15 MED ORDER — ONDANSETRON HCL 4 MG PO TABS
4.0000 mg | ORAL_TABLET | Freq: Four times a day (QID) | ORAL | Status: DC | PRN
  Administered 2014-06-15: 4 mg via ORAL
  Filled 2014-06-15: qty 1

## 2014-06-15 MED ORDER — ENSURE ENLIVE PO LIQD
237.0000 mL | Freq: Every day | ORAL | Status: DC
Start: 2014-06-16 — End: 2014-06-17
  Administered 2014-06-16 – 2014-06-17 (×2): 237 mL via ORAL

## 2014-06-15 MED ORDER — METOCLOPRAMIDE HCL 5 MG/ML IJ SOLN
5.0000 mg | Freq: Three times a day (TID) | INTRAMUSCULAR | Status: DC | PRN
Start: 2014-06-15 — End: 2014-06-17
  Administered 2014-06-15: 10 mg via INTRAVENOUS
  Filled 2014-06-15: qty 2

## 2014-06-15 MED ORDER — ONDANSETRON HCL 4 MG/2ML IJ SOLN
INTRAMUSCULAR | Status: DC | PRN
  Administered 2014-06-15: 4 mg via INTRAVENOUS

## 2014-06-15 SURGICAL SUPPLY — 57 items
ADH SKN CLS APL DERMABOND .7 (GAUZE/BANDAGES/DRESSINGS) ×1
BAG SPEC THK2 15X12 ZIP CLS (MISCELLANEOUS) ×1
BAG ZIPLOCK 12X15 (MISCELLANEOUS) ×3 IMPLANT
BLADE SAW SGTL 18X1.27X75 (BLADE) ×2 IMPLANT
BLADE SAW SGTL 18X1.27X75MM (BLADE) ×1
BRUSH FEMORAL CANAL (MISCELLANEOUS) ×3 IMPLANT
CAPT HIP TOTAL 2 ×2 IMPLANT
DERMABOND ADVANCED (GAUZE/BANDAGES/DRESSINGS) ×2
DERMABOND ADVANCED .7 DNX12 (GAUZE/BANDAGES/DRESSINGS) IMPLANT
DRAPE INCISE IOBAN 85X60 (DRAPES) ×3 IMPLANT
DRAPE ORTHO SPLIT 77X108 STRL (DRAPES) ×6
DRAPE POUCH INSTRU U-SHP 10X18 (DRAPES) ×3 IMPLANT
DRAPE SURG 17X11 SM STRL (DRAPES) ×3 IMPLANT
DRAPE SURG ORHT 6 SPLT 77X108 (DRAPES) ×2 IMPLANT
DRAPE U-SHAPE 47X51 STRL (DRAPES) ×3 IMPLANT
DRSG AQUACEL AG ADV 3.5X10 (GAUZE/BANDAGES/DRESSINGS) ×2 IMPLANT
DRSG EMULSION OIL 3X16 NADH (GAUZE/BANDAGES/DRESSINGS) ×3 IMPLANT
DRSG MEPILEX BORDER 4X4 (GAUZE/BANDAGES/DRESSINGS) ×3 IMPLANT
DRSG MEPILEX BORDER 4X8 (GAUZE/BANDAGES/DRESSINGS) ×3 IMPLANT
DURAPREP 26ML APPLICATOR (WOUND CARE) ×3 IMPLANT
ELECT BLADE TIP CTD 4 INCH (ELECTRODE) ×3 IMPLANT
ELECT REM PT RETURN 9FT ADLT (ELECTROSURGICAL) ×3
ELECTRODE REM PT RTRN 9FT ADLT (ELECTROSURGICAL) ×1 IMPLANT
FACESHIELD WRAPAROUND (MASK) ×12 IMPLANT
FACESHIELD WRAPAROUND OR TEAM (MASK) ×4 IMPLANT
GLOVE BIOGEL PI IND STRL 7.5 (GLOVE) ×1 IMPLANT
GLOVE BIOGEL PI IND STRL 8.5 (GLOVE) ×1 IMPLANT
GLOVE BIOGEL PI INDICATOR 7.5 (GLOVE) ×6
GLOVE BIOGEL PI INDICATOR 8.5 (GLOVE) ×2
GLOVE ORTHO TXT STRL SZ7.5 (GLOVE) ×6 IMPLANT
GLOVE SURG ORTHO 8.0 STRL STRW (GLOVE) ×3 IMPLANT
GLOVE SURG SS PI 7.5 STRL IVOR (GLOVE) ×4 IMPLANT
GOWN SPEC L3 XXLG W/TWL (GOWN DISPOSABLE) ×6 IMPLANT
GOWN STRL REUS W/TWL LRG LVL3 (GOWN DISPOSABLE) ×3 IMPLANT
HANDPIECE INTERPULSE COAX TIP (DISPOSABLE) ×3
KIT BASIN OR (CUSTOM PROCEDURE TRAY) ×3 IMPLANT
MANIFOLD NEPTUNE II (INSTRUMENTS) ×3 IMPLANT
NS IRRIG 1000ML POUR BTL (IV SOLUTION) ×6 IMPLANT
PACK TOTAL JOINT (CUSTOM PROCEDURE TRAY) ×3 IMPLANT
PADDING CAST COTTON 6X4 STRL (CAST SUPPLIES) ×3 IMPLANT
POSITIONER SURGICAL ARM (MISCELLANEOUS) ×3 IMPLANT
PRESSURIZER FEMORAL UNIV (MISCELLANEOUS) ×3 IMPLANT
SET HNDPC FAN SPRY TIP SCT (DISPOSABLE) ×1 IMPLANT
SPONGE LAP 18X18 X RAY DECT (DISPOSABLE) ×3 IMPLANT
SPONGE LAP 4X18 X RAY DECT (DISPOSABLE) ×3 IMPLANT
STAPLER VISISTAT 35W (STAPLE) ×3 IMPLANT
SUCTION FRAZIER TIP 10 FR DISP (SUCTIONS) ×3 IMPLANT
SUT MNCRL AB 4-0 PS2 18 (SUTURE) ×2 IMPLANT
SUT VIC AB 1 CT1 36 (SUTURE) ×9 IMPLANT
SUT VIC AB 2-0 CT1 27 (SUTURE) ×9
SUT VIC AB 2-0 CT1 TAPERPNT 27 (SUTURE) ×3 IMPLANT
SUT VLOC 180 0 24IN GS25 (SUTURE) ×6 IMPLANT
TOWEL OR 17X26 10 PK STRL BLUE (TOWEL DISPOSABLE) ×6 IMPLANT
TOWER CARTRIDGE SMART MIX (DISPOSABLE) ×3 IMPLANT
TRAY FOLEY CATH 16FR SILVER (SET/KITS/TRAYS/PACK) ×2 IMPLANT
TRAY FOLEY W/METER SILVER 14FR (SET/KITS/TRAYS/PACK) ×1 IMPLANT
WATER STERILE IRR 1500ML POUR (IV SOLUTION) ×3 IMPLANT

## 2014-06-15 NOTE — Anesthesia Postprocedure Evaluation (Signed)
  Anesthesia Post-op Note  Patient: [name] [name]  Procedure(s) Performed: Procedure(s) (LRB): REMOVAL OF ANTIBIOTIC  SPACER AND CONVERSION TO RIGHT TOTAL HIP  ARTHROPLASTY  (Right)  Patient Location: PACU  Anesthesia Type: General  Level of Consciousness: awake and alert   Airway and Oxygen Therapy: Patient Spontanous Breathing  Post-op Pain: mild  Post-op Assessment: Post-op Vital signs reviewed, Patient's Cardiovascular Status Stable, Respiratory Function Stable, Patent Airway and No signs of Nausea or vomiting  Last Vitals:  Filed Vitals:   06/15/14 2020  BP: 107/58  Pulse: 70  Temp: 36.7 C  Resp: 16    Post-op Vital Signs: stable   Complications: No apparent anesthesia complications

## 2014-06-15 NOTE — Plan of Care (Signed)
Problem: Consults Goal: Diagnosis- Total Joint Replacement Revision Total Hip     

## 2014-06-15 NOTE — Brief Op Note (Signed)
06/15/2014  4:49 PM  PATIENT:  [name] [name]  54 y.o. male  PRE-OPERATIVE DIAGNOSIS: Status post resection of native right septic hip joint, placement of temporary antibiotic cemented total hip replacement  POST-OPERATIVE DIAGNOSIS:  Status post resection of native right septic hip joint, placement of temporary antibiotic cemented total hip replacement  PROCEDURE:  Procedure(s): REMOVAL OF ANTIBIOTIC  SPACER AND CONVERSION TO RIGHT TOTAL HIP  ARTHROPLASTY  (Right)  SURGEON:  Surgeon(s) and Role:    * Paralee Cancel, MD - Primary  PHYSICIAN ASSISTANT: Danae Orleans, PA-C  ANESTHESIA:   general  EBL:  Total I/O In: 1000 [I.V.:1000] Out: 325 [Urine:125; Blood:200]  BLOOD ADMINISTERED:none  DRAINS: none   LOCAL MEDICATIONS USED:  NONE  SPECIMEN:  No Specimen  DISPOSITION OF SPECIMEN:  N/A  COUNTS:  YES  TOURNIQUET:  * No tourniquets in log *  DICTATION: .Other Dictation: Dictation Number [id number]  PLAN OF CARE: Admit to inpatient   PATIENT DISPOSITION:  PACU - hemodynamically stable.   Delay start of Pharmacological VTE agent (>24hrs) due to surgical blood loss or risk of bleeding: no

## 2014-06-15 NOTE — Transfer of Care (Signed)
Immediate Anesthesia Transfer of Care Note  Patient: [name] [name]  Procedure(s) Performed: Procedure(s): REMOVAL OF ANTIBIOTIC  SPACER AND CONVERSION TO RIGHT TOTAL HIP  ARTHROPLASTY  (Right)  Patient Location: PACU  Anesthesia Type:General  Level of Consciousness: awake, alert , oriented and patient cooperative  Airway & Oxygen Therapy: Patient Spontanous Breathing and Patient connected to face mask oxygen  Post-op Assessment: Report given to RN, Post -op Vital signs reviewed and stable and Patient moving all extremities X 4  Post vital signs: stable  Last Vitals:  Filed Vitals:   06/15/14 1256  BP: 116/81  Pulse: 63  Temp: 36.3 C  Resp: 16    Complications: No apparent anesthesia complications

## 2014-06-15 NOTE — Interval H&P Note (Signed)
History and Physical Interval Note:  06/15/2014 2:36 PM  [name] [name]  has presented today for surgery, with the diagnosis of STATUS POST SEPTIC RIGHT HIP WITH PLACEMENT OF ANTIBIOTIC SPACER   The various methods of treatment have been discussed with the patient and family. After consideration of risks, benefits and other options for treatment, the patient has consented to  Procedure(s): REMOVAL OF ANTIBIOTIC  SPACER AND CONVERSION TO RIGHT TOTAL HIP  ARTHROPLASTY  (Right) as a surgical intervention .  The patient's history has been reviewed, patient examined, no change in status, stable for surgery.  I have reviewed the patient's chart and labs.  Questions were answered to the patient's satisfaction.     [name]

## 2014-06-15 NOTE — Anesthesia Preprocedure Evaluation (Addendum)
Anesthesia Evaluation    Airway Mallampati: II  TM Distance: >3 FB Neck ROM: Full    Dental no notable dental hx.    Pulmonary former smoker,  breath sounds clear to auscultation  Pulmonary exam normal       Cardiovascular + Peripheral Vascular Disease Normal cardiovascular examRhythm:Regular Rate:Normal     Neuro/Psych    GI/Hepatic   Endo/Other    Renal/GU Renal disease     Musculoskeletal  (+) Arthritis -,   Abdominal   Peds  Hematology  (+) anemia ,   Anesthesia Other Findings   Reproductive/Obstetrics                          Anesthesia Physical Anesthesia Plan  ASA: II  Anesthesia Plan: General   Post-op Pain Management:    Induction: Intravenous  Airway Management Planned: Oral ETT  Additional Equipment:   Intra-op Plan:   Post-operative Plan: Extubation in OR  Informed Consent: I have reviewed the patients History and Physical, chart, labs and discussed the procedure including the risks, benefits and alternatives for the proposed anesthesia with the patient or authorized representative who has indicated his/her understanding and acceptance.   Dental advisory given  Plan Discussed with: CRNA  Anesthesia Plan Comments: (Clearance from Infectious Diseases, (Dr. Little Ishikawa June 1st. CD4 320 and HIV RNA 1762 copies/ml. (These values are within accepted guidelines for elective surgery.) Discussed with Dr. [name] and will proceed.   Discussed spinal and general with patient and he prefers general.)     Anesthesia Quick Evaluation

## 2014-06-15 NOTE — Anesthesia Procedure Notes (Signed)
Procedure Name: Intubation Date/Time: 06/15/2014 3:33 PM Performed by: Lissa Morales Pre-anesthesia Checklist: Patient identified, Emergency Drugs available, Suction available and Patient being monitored Patient Re-evaluated:Patient Re-evaluated prior to inductionOxygen Delivery Method: Circle System Utilized Preoxygenation: Pre-oxygenation with 100% oxygen Intubation Type: IV induction Ventilation: Mask ventilation without difficulty Laryngoscope Size: Mac and 4 Grade View: Grade II Tube type: Oral Tube size: 8.0 mm Number of attempts: 1 Airway Equipment and Method: Stylet and Oral airway Placement Confirmation: ETT inserted through vocal cords under direct vision,  positive ETCO2 and breath sounds checked- equal and bilateral Secured at: 21 cm Tube secured with: Tape Dental Injury: Teeth and Oropharynx as per pre-operative assessment

## 2014-06-16 ENCOUNTER — Encounter (HOSPITAL_COMMUNITY): Payer: Self-pay | Admitting: Orthopedic Surgery

## 2014-06-16 LAB — CBC
HCT: 33.5 % — ABNORMAL LOW (ref 39.0–52.0)
Hemoglobin: 10.9 g/dL — ABNORMAL LOW (ref 13.0–17.0)
MCH: 31 pg (ref 26.0–34.0)
MCHC: 32.5 g/dL (ref 30.0–36.0)
MCV: 95.2 fL (ref 78.0–100.0)
PLATELETS: 154 10*3/uL (ref 150–400)
RBC: 3.52 MIL/uL — ABNORMAL LOW (ref 4.22–5.81)
RDW: 14.5 % (ref 11.5–15.5)
WBC: 7.2 10*3/uL (ref 4.0–10.5)

## 2014-06-16 LAB — BASIC METABOLIC PANEL
ANION GAP: 13 (ref 5–15)
BUN: 14 mg/dL (ref 6–20)
CO2: 22 mmol/L (ref 22–32)
CREATININE: 1.15 mg/dL (ref 0.61–1.24)
Calcium: 9.2 mg/dL (ref 8.9–10.3)
Chloride: 105 mmol/L (ref 101–111)
GFR calc Af Amer: >60 mL/min (ref >60)
Glucose, Bld: 169 mg/dL — ABNORMAL HIGH (ref 65–99)
POTASSIUM: 4.8 mmol/L (ref 3.5–5.1)
SODIUM: 140 mmol/L (ref 135–145)

## 2014-06-16 NOTE — Progress Notes (Signed)
CSW consulted for SNF placement. PN reviewed. PT is recommending HHPT following hospital d/c. RNCM will assist with d/c planning.  Kazden Largo LCSW [phone] 

## 2014-06-16 NOTE — Progress Notes (Signed)
Patient ID: [name] [name], male   DOB: Apr 28, 1960, 54 y.o.   MRN: [id number] Subjective: 1 Day Post-Op Procedure(s) (LRB): REMOVAL OF ANTIBIOTIC  SPACER AND CONVERSION TO RIGHT TOTAL HIP  ARTHROPLASTY  (Right)    Patient reports pain as mild. A bit sore but otherwise OK   Objective:   VITALS:   Filed Vitals:   06/16/14 0549  BP: 91/52  Pulse: 52  Temp: 98.2 F (36.8 C)  Resp: 16    Neurovascular intact Incision: dressing C/D/I  LABS  Recent Labs  06/16/14 0428  HGB 10.9*  HCT 33.5*  WBC 7.2  PLT 154     Recent Labs  06/16/14 0428  NA 140  K 4.8  BUN 14  CREATININE 1.15  GLUCOSE 169*    No results for input(s): LABPT, INR in the last 72 hours.   Assessment/Plan: 1 Day Post-Op Procedure(s) (LRB): REMOVAL OF ANTIBIOTIC  SPACER AND CONVERSION TO RIGHT TOTAL HIP  ARTHROPLASTY  (Right)   Advance diet Up with therapy Plan for discharge tomorrow - as he is interested in going home want to confirm safety with mobilization

## 2014-06-16 NOTE — Care Management Note (Signed)
Case Management Note  Patient Details  Name: LADARRIAN ASENCIO MRN: [id number] Date of Birth: [birth year]  Subjective/Objective:                  REMOVAL OF ANTIBIOTIC SPACER AND CONVERSION TO RIGHT TOTAL HIP ARTHROPLASTY (Right)  Action/Plan:  Discharge planning Expected Discharge Date:  06/17/14              Expected Discharge Plan:  [place]  In-House Referral:     Discharge planning Services  CM Consult  Post Acute Care Choice:  Home Health Choice offered to:  Patient  DME Arranged:    DME Agency:     HH Arranged:  PT HH Agency:  [facility]  Status of Service:  Completed, signed off  Medicare Important Message Given:    Date Medicare IM Given:    Medicare IM give by:    Date Additional Medicare IM Given:    Additional Medicare Important Message give by:     If discussed at [place] of Stay Meetings, dates discussed:    Additional Comments: CM met with pt in room to offer choice of home health agency.  Pt chooses [facility] to render HHPT.  Pt has both a rolling walker and elevated commode with handles.  Address and  Contact information verified by pt.  Referral emailed to [facility] rep, Tim.  No other CM needs were communicated.   Dellie Catholic, RN 06/16/2014, 11:54 AM

## 2014-06-16 NOTE — Op Note (Signed)
NAMEBRAXEN, DOBEK                ACCOUNT NO.:  [id number]  MEDICAL RECORD NO.:  [id number]  LOCATION:  5956                         FACILITY:  [facility]  PHYSICIAN:  Pietro Cassis. [name], M.D.  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  PROCEDURE:  Resection of previous intermittent cemented antibiotics total hip arthroplasty, Prostalac with revision to a right total hip arthroplasty.  COMPONENTS USED:  A DePuy hip system with a size 54-mm Pinnacle shell, 36+ 4 neutral liner, a size 8 high Tri-Lock stem with 36+ 5 Delta ceramic ball.  SURGEON:  Pietro Cassis. [name], M.D.  ASSISTANT:  Danae Orleans, PA-C.  ANESTHESIA:  General.  SPECIMENS:  None.  COMPLICATIONS:  None.  DRAINS:  None.  BLOOD LOSS:  About 250 mL.  INDICATIONS FOR THE PROCEDURE:  Mr. Feinstein is a 54 year old male with medical history consistent of HIV.  He had developed an abscess of his right hip related to a sacral decubitus that was treated back in 2015. Given the persistence of his infection in the hip joint and pain, I was asked to get involved with his definitive treatment.  He underwent a resection of his native infected hip joint and placement of a cemented temporary hip spacer.  He subsequently underwent long-standing IV antibiotic treatment for infection of the right hip joint.  In the interim, he was also being treated by Dr. Theodoro Kos for his sacral decubitus.  As the time has passed, he was subsequently cleared from a Plastic Surgery standpoint based on the healing of the sacral  decubitus. His lab results including sedimentation rate and C-reactive protein had returned to normal.  At this point, we felt that it was safe to proceed with reimplantation of a right total hip arthroplasty to help with his overall activity and function.  Risks of recurrence of infection the treatment would be required, the risks of potential [place] was discussed, neurovascular complications as well as DVT and dislocation reviewed.  Consent was obtained for reimplantation.  PROCEDURE IN DETAIL:  The patient was brought to the operative theater. Once adequate anesthesia, preoperative antibiotics, Ancef 2 g as well as 1 g of tranexamic acid, he was positioned into the left lateral decubitus position with the right side up.  The right lower extremity was then prepped and draped in sterile fashion.  A time-out was performed identifying the patient, planned procedure, and extremity. His old incision was identified and portion of it utilized soft tissue planes created.  He was noted to be very thin male, very cachectic with very prominent bony prominence.  There was no evidence of any heterotopic bone or other complicating issues.  Posterior approach to the hip was carried out including debridement of significant scarring in the posterior aspect of the hip joint with the posterior two-thirds of the  hip joint exposed.  I was able to then dislocate the hip.  I then used osteotomes to remove some of the proximal femoral cement and then using the S-ROM loop extractor, the femoral component was removed in total without complication.  Once this was done, I used osteotomes and removed the acetabular shell and cement without difficulty.  Attention was now directed first to the femur.  Femoral preparation was for a Tri-Lock hip stem.  I started broaching with a size 0 broach then all the way up to size 8, maintaining femoral anteversion at about 20 degrees.  I felt that I had good proximal,  medial and lateral fit on the proximal femur.  With this completed, I then retracted the femur anteriorly and worked on the acetabulum.  The acetabular exposure was adequate enough to use straight reamers.  I reamed with a 49 reamer, then to a 51 and then 53- mm reamer with good bony preparation noting relatively soft osteopenic bone bed.  The 54 cup was selected and positioned at about 35-40 degrees of abduction and was positioned with forward flexion of about 25 degrees with portion of the anterior rim of the acetabulum exposed and a portion of the cup exposed in the superolateral aspect of the hip.  Based on the measured orientation of this and the known orientation of the femoral component, I went ahead and drilled for a cancellous screw into the ilium followed by the hole eliminator.  The final 36+ 4 neutral AltrX liner was then impacted into position.  Trial reduction was now carried out with 8 broach in place, a high offset neck and a 36 1.5 and then +5 ball.  Multiple variables here obvious that I am concerned about, #1 stability of the hip.  This combined anteversion and was probably about 50 degrees plus.  There was no evidence any impingement in extension and external rotation, predominantly related to the fact that he was so tight from his probably just inactivity over the past year.  With a +5 ball, I felt more secure with regard to his motion and any potential leveraging with hip flexion and internal rotation, which would be a common position of the seated place.  I felt that this probably has caused some lengthen of his right lower extremity, but in the revision setting with a scar that he has in the risk for dislocation was not detect that risk and I not felt this was an acceptable issue.  Following these trial reductions, I selected the 8 high Tri-Lock stem, it was impacted and sat basically at the level of the trial reduction, was carried out.  Based on this and  with the trial reduction, we selected a 36+ 5 Delta ceramic ball and impacted on clean and dried trunnion.  We had irrigated the hip throughout the case and again at this point, I then reapproximated the iliotibial band and gluteal fascia with #1 Vicryl and 0 V-Loc suture.  There was no pseudocapsule to repair posteriorly based on the scar debridement.  The remainder of the wound was closed with 2-0 Vicryl and running 4-0 Monocryl.  The hip was then cleaned, dried and dressed sterilely using surgical glue and an Aquacel dressing.  He was then extubated and brought to the recovery room in stable condition without apparent complication.  We will have him would be partial weightbearing for probably 4-6 weeks to allow the ingrowth of the components and we will progress in their strength and exercise of imperative to  improve his overall functional activity.  After few months, we will allow for further increase stretching and improve overall motion.  I do feel that lot of his pain that he maybe experiences can be related to muscular tightness.     Pietro Cassis [name], M.D.     MDO/MEDQ  D:  06/15/2014  T:  06/16/2014  Job:  [id number]

## 2014-06-16 NOTE — Plan of Care (Signed)
Problem: Consults Goal: Diagnosis- Total Joint Replacement Outcome: Completed/Met Date Met:  06/16/14 Primary Total Hip  Antibiotic spacer removal with conversion to RIGHT Total Hip

## 2014-06-16 NOTE — Evaluation (Signed)
Physical Therapy Evaluation Patient Details Name: [name] [name] MRN: [id number] DOB: Dec 08, 1960 Today's Date: 06/16/2014   History of Present Illness  R THR revision from antibiotic spacer  Clinical Impression  Pt s/p R THR revision presents with decreased R LE strength/ROM, post op pain, posterior THP and PWB limiting functional mobility.  Pt should progress to dc home with family assist and HHPT follow up.    Follow Up Recommendations Home health PT    Equipment Recommendations  None recommended by PT    Recommendations for Other Services OT consult     Precautions / Restrictions Precautions Precautions: Fall;Posterior Hip Restrictions Weight Bearing Restrictions: No Other Position/Activity Restrictions: WBAT      Mobility  Bed Mobility Overal bed mobility: Needs Assistance Bed Mobility: Supine to Sit     Supine to sit: Min assist     General bed mobility comments: cues for LE sequence, THP and use of L LE to self assist  Transfers Overall transfer level: Needs assistance Equipment used: Rolling walker (2 wheeled) Transfers: Sit to/from Stand Sit to Stand: Min assist;Mod assist         General transfer comment: cues for LE management, THP and use of UEs to self assist  Ambulation/Gait Ambulation/Gait assistance: Min assist Ambulation Distance (Feet): 62 Feet Assistive device: Rolling walker (2 wheeled) Gait Pattern/deviations: Step-to pattern;Decreased step length - right;Decreased step length - left;Shuffle;Trunk flexed     General Gait Details: cues for sequence, posture and position from [employer]            Wheelchair Mobility    Modified Rankin (Stroke Patients Only)       Balance                                             Pertinent Vitals/Pain Pain Assessment: 0-10 Pain Score: 6  Pain Location: R hip Pain Descriptors / Indicators: Aching;Sore Pain Intervention(s): Limited activity within patient's  tolerance;Monitored during session;Premedicated before session;Ice applied    Home Living Family/patient expects to be discharged to:: Private residence Living Arrangements: Spouse/significant other Available Help at Discharge: Friend(s);Family;Available 24 hours/day Type of Home: House Home Access: Ramped entrance   Entrance Stairs-Number of Steps: 1     Additional Comments: Pt states hopes to dc home with sister who is retired Quarry manager Level of Independence: Independent         Comments: Drives, worked up until illness     Journalist, newspaper        Extremity/Trunk Assessment   Upper Extremity Assessment: Overall WFL for tasks assessed           Lower Extremity Assessment: RLE deficits/detail RLE Deficits / Details: Hip strength 2+/5 with AAROM at hip to 90 flex and 20 abd    Cervical / Trunk Assessment: Normal  Communication   Communication: No difficulties  Cognition Arousal/Alertness: Awake/alert Behavior During Therapy: WFL for tasks assessed/performed Overall Cognitive Status: Within Functional Limits for tasks assessed                      General Comments      Exercises Total Joint Exercises Ankle Circles/Pumps: AROM;Both;15 reps;Supine Quad Sets: AROM;Both;10 reps;Supine Heel Slides: AAROM;20 reps;Right;Supine Hip ABduction/ADduction: AAROM;Right;15 reps;Supine      Assessment/Plan    PT Assessment Patient needs continued PT services  PT Diagnosis  Difficulty walking   PT Problem List Decreased strength;Decreased range of motion;Decreased activity tolerance;Decreased mobility;Decreased knowledge of use of DME;Pain  PT Treatment Interventions DME instruction;Gait training;Stair training;Functional mobility training;Therapeutic activities;Therapeutic exercise;Patient/family education   PT Goals (Current goals can be found in the Care Plan section) Acute Rehab PT Goals Patient Stated Goal: Be able to walk without AD PT  Goal Formulation: With patient Time For Goal Achievement: 06/20/14 Potential to Achieve Goals: Good    Frequency 7X/week   Barriers to discharge        Co-evaluation               End of Session Equipment Utilized During Treatment: Gait belt Activity Tolerance: Patient tolerated treatment well Patient left: in bed;with call bell/phone within reach Nurse Communication: Mobility status         Time: 2774-1287 PT Time Calculation (min) (ACUTE ONLY): 39 min   Charges:         PT G Codes:        Uchechukwu Dhawan Jul 07, 2014, 12:44 PM

## 2014-06-16 NOTE — Progress Notes (Signed)
Physical Therapy Treatment Patient Details Name: [name] [name] MRN: [id number] DOB: 03-May-1960 Today's Date: 06/16/2014    History of Present Illness R THR revision from antibiotic spacer    PT Comments    Pt progressing well with mobility and demonstrating good awareness of THP and PWB.  Follow Up Recommendations  Home health PT     Equipment Recommendations  None recommended by PT    Recommendations for Other Services OT consult     Precautions / Restrictions Precautions Precautions: Fall;Posterior Hip Precaution Booklet Issued: Yes (comment) Precaution Comments: Sign hung on wall Restrictions Weight Bearing Restrictions: Yes RLE Weight Bearing: Partial weight bearing RLE Partial Weight Bearing Percentage or Pounds: 50%    Mobility  Bed Mobility Overal bed mobility: Needs Assistance Bed Mobility: Supine to Sit;Sit to Supine     Supine to sit: Min guard Sit to supine: Min guard   General bed mobility comments: cues for sequence and adherence to THP  Transfers Overall transfer level: Needs assistance Equipment used: Rolling walker (2 wheeled) Transfers: Sit to/from Stand Sit to Stand: Min guard;Min assist         General transfer comment: first time min A, second min guard. Cues for UE placement and LE management  Ambulation/Gait Ambulation/Gait assistance: Min guard Ambulation Distance (Feet): 147 Feet Assistive device: Rolling walker (2 wheeled) Gait Pattern/deviations: Step-to pattern;Decreased step length - right;Decreased step length - left;Shuffle;Trunk flexed     General Gait Details: cues for sequence, posture and position from [employer]            Wheelchair Mobility    Modified Rankin (Stroke Patients Only)       Balance                                    Cognition Arousal/Alertness: Awake/alert Behavior During Therapy: WFL for tasks assessed/performed Overall Cognitive Status: Within Functional Limits for  tasks assessed                      Exercises Total Joint Exercises Ankle Circles/Pumps: AROM;Both;15 reps;Supine Quad Sets: AROM;Both;10 reps;Supine Heel Slides: AAROM;20 reps;Right;Supine Hip ABduction/ADduction: AAROM;Right;15 reps;Supine    General Comments        Pertinent Vitals/Pain Pain Assessment: 0-10 Pain Score: 4  Pain Location: R hip Pain Descriptors / Indicators: Aching;Sore Pain Intervention(s): Limited activity within patient's tolerance;Monitored during session;Premedicated before session;Ice applied    Home Living                      Prior Function            PT Goals (current goals can now be found in the care plan section) Acute Rehab PT Goals Patient Stated Goal: Get back to work--can't use RW there PT Goal Formulation: With patient Time For Goal Achievement: 06/20/14 Potential to Achieve Goals: Good Progress towards PT goals: Progressing toward goals    Frequency  7X/week    PT Plan Current plan remains appropriate    Co-evaluation             End of Session Equipment Utilized During Treatment: Gait belt Activity Tolerance: Patient tolerated treatment well Patient left: in bed;with call bell/phone within reach     Time: 1355-1440 PT Time Calculation (min) (ACUTE ONLY): 45 min  Charges:  $Gait Training: 8-22 mins $Therapeutic Exercise: 8-22 mins $Therapeutic Activity: 8-22 mins  G Codes:      Ulrick Methot June 28, 2014, 4:35 PM

## 2014-06-16 NOTE — Evaluation (Signed)
Occupational Therapy Evaluation Patient Details Name: [name] [name] MRN: [id number] DOB: 04-20-1960 Today's Date: 06/16/2014    History of Present Illness R THR revision from antibiotic spacer   Clinical Impression   This 54 year old man was admitted for the above surgery. All education was completed.  No further OT is needed at this time    Follow Up Recommendations  No OT follow up    Equipment Recommendations  None recommended by OT    Recommendations for Other Services       Precautions / Restrictions Precautions Precautions: Fall;Posterior Hip Restrictions Weight Bearing Restrictions: No Other Position/Activity Restrictions: WBAT      Mobility Bed Mobility Overal bed mobility: Needs Assistance Bed Mobility: Supine to Sit     Supine to sit: Min guard     General bed mobility comments: for safety/precautions.  Did assist with removing pillow--pt stays on side   Transfers Overall transfer level: Needs assistance Equipment used: Rolling walker (2 wheeled) Transfers: Sit to/from Stand Sit to Stand: Min guard;Min assist         General transfer comment: first time min A, second min guard from elevated surface. Cues for UE placement    Balance                                            ADL Overall ADL's : Needs assistance/impaired     Grooming: Oral care;Supervision/safety;Standing                   Toilet Transfer: Min guard;Ambulation (to bed)             General ADL Comments: pt has been using AE for ADLs up until this sx. Did not feel he needed to practice.  He managed bed mobility with supervision, cues.  He feels good with all education and states that shower ledge is very small and he will be fine with this transfer.  He also has a hand held shower at [place]      Pertinent Vitals/Pain Pain Assessment: 0-10 Pain Score: 4  Pain Location: R hip Pain Descriptors  / Indicators: Sore Pain Intervention(s): Limited activity within patient's tolerance;Monitored during session;Premedicated before session;Repositioned;Ice applied     Hand Dominance     Extremity/Trunk Assessment Upper Extremity Assessment Upper Extremity Assessment: Overall WFL for tasks assessed      Cervical / Trunk Assessment Cervical / Trunk Assessment: Normal   Communication Communication Communication: No difficulties   Cognition Arousal/Alertness: Awake/alert Behavior During Therapy: WFL for tasks assessed/performed Overall Cognitive Status: Within Functional Limits for tasks assessed                     General Comments       Exercises      Shoulder Instructions      Home Living Family/patient expects to be discharged to:: Private residence Living Arrangements: Spouse/significant other Available Help at Discharge: Friend(s);Family;Available 24 hours/day Type of Home: House Home Access: Ramped entrance Entrance Stairs-Number of Steps: 1         Bathroom Shower/Tub: Occupational psychologist: Standard     Home Equipment: Toilet riser;Shower seat   Additional Comments: Pt states hopes to dc home with sister who is retired Pharmacist, hospital  Prior Functioning/Environment Level of Independence: Independent        Comments: Drives, worked up until illness    OT Diagnosis: Generalized weakness   OT Problem List:     OT Treatment/Interventions:      OT Goals(Current goals can be found in the care plan section) Acute Rehab OT Goals Patient Stated Goal: Get back to work--can't use RW there  OT Frequency:     Barriers to D/C:            Co-evaluation              End of Session    Activity Tolerance: Patient tolerated treatment well Patient left: in bed;with call bell/phone within reach   Time: 1229-1251 OT Time Calculation (min): 22 min Charges:  OT General Charges $OT Visit: 1 Procedure OT Evaluation $Initial OT  Evaluation Tier I: 1 Procedure G-Codes:    [name] July 09, 2014, 1:04 PM  [name], OTR/L [phone] 07-09-14

## 2014-06-17 LAB — CBC
HEMATOCRIT: 30.6 % — AB (ref 39.0–52.0)
HEMOGLOBIN: 9.9 g/dL — AB (ref 13.0–17.0)
MCH: 31 pg (ref 26.0–34.0)
MCHC: 32.4 g/dL (ref 30.0–36.0)
MCV: 95.9 fL (ref 78.0–100.0)
PLATELETS: 145 10*3/uL — AB (ref 150–400)
RBC: 3.19 MIL/uL — ABNORMAL LOW (ref 4.22–5.81)
RDW: 14.7 % (ref 11.5–15.5)
WBC: 9.7 10*3/uL (ref 4.0–10.5)

## 2014-06-17 LAB — BASIC METABOLIC PANEL
Anion gap: 6 (ref 5–15)
BUN: 23 mg/dL — ABNORMAL HIGH (ref 6–20)
CALCIUM: 9.6 mg/dL (ref 8.9–10.3)
CO2: 29 mmol/L (ref 22–32)
Chloride: 107 mmol/L (ref 101–111)
Creatinine, Ser: 1.21 mg/dL (ref 0.61–1.24)
Glucose, Bld: 174 mg/dL — ABNORMAL HIGH (ref 65–99)
Potassium: 5.3 mmol/L — ABNORMAL HIGH (ref 3.5–5.1)
Sodium: 142 mmol/L (ref 135–145)

## 2014-06-17 MED ORDER — DOCUSATE SODIUM 100 MG PO CAPS: 100.0000 mg | ORAL_CAPSULE | Freq: Two times a day (BID) | ORAL | Status: DC

## 2014-06-17 MED ORDER — METHOCARBAMOL 500 MG PO TABS: 500.0000 mg | ORAL_TABLET | Freq: Four times a day (QID) | ORAL | Status: DC | PRN

## 2014-06-17 MED ORDER — FERROUS SULFATE 325 (65 FE) MG PO TABS: 325.0000 mg | ORAL_TABLET | Freq: Three times a day (TID) | ORAL | Status: DC

## 2014-06-17 MED ORDER — HYDROCODONE-ACETAMINOPHEN 7.5-325 MG PO TABS: 1.0000 | ORAL_TABLET | ORAL | Status: DC | PRN

## 2014-06-17 MED ORDER — ASPIRIN 325 MG PO TBEC: 325.0000 mg | DELAYED_RELEASE_TABLET | Freq: Two times a day (BID) | ORAL | Status: AC

## 2014-06-17 MED ORDER — POLYETHYLENE GLYCOL 3350 17 G PO PACK: 17.0000 g | PACK | Freq: Two times a day (BID) | ORAL | Status: DC

## 2014-06-17 NOTE — Clinical Documentation Improvement (Signed)
PLEASE NOTE IF PRESENT ON ADMISSION Please clarify the Sacral Decubitus: Possible Clinical Conditions?   Stage  I  Pressure Ulcer   (reddening of the skin) Stage  II Pressure Ulcer  (blister open or unopened) Stage  III Pressure Ulcer (through all layers skin) Stage IV Pressure Ulcer   (through skin & underlying  muscle, tendons, and bones) Other Condition Cannot Clinically Determine   Supporting Information:(As per notes) "He has also had an associated sacral decubitus that has been treated and managed through Dr. Lyndee Leo Sanger "  Thank You, Alessandra Grout, RN, BSN, CCDS,Clinical Documentation Specialist:  [phone]  [phone]=[facility] Management

## 2014-06-17 NOTE — Clinical Documentation Improvement (Signed)
Please Clarify HIV Diagnosis: Possible Clinical Conditions?  AIDS HIV Disease HIV (+) unsymptomatic Symptomatic HIV Other Condition Cannot Clinically Determine   Supporting Information:(As per notes) "[name] [name] is a 54 year old male with multiple medical issues including HIV"    Thank You, Alessandra Grout, RN, BSN, CCDS,Clinical Documentation Specialist:  [phone]  [phone]=[facility] [place] [facility]

## 2014-06-17 NOTE — Discharge Instructions (Signed)

## 2014-06-17 NOTE — Progress Notes (Signed)
Physical Therapy Treatment Patient Details Name: [name] [name] MRN: [id number] DOB: 15-Sep-1960 Today's Date: 06/17/2014    History of Present Illness R THR revision from antibiotic spacer    PT Comments    Pt progressing well with mobility.  Reviewed stairs, car transfers and home therex.  Pt plans dc home this date.  Follow Up Recommendations  Home health PT     Equipment Recommendations  None recommended by PT    Recommendations for Other Services OT consult     Precautions / Restrictions Precautions Precautions: Fall;Posterior Hip Precaution Booklet Issued: Yes (comment) Precaution Comments: Sign hung on wall Restrictions Weight Bearing Restrictions: Yes RLE Weight Bearing: Partial weight bearing RLE Partial Weight Bearing Percentage or Pounds: 50%    Mobility  Bed Mobility Overal bed mobility: Modified Independent Bed Mobility: Supine to Sit;Sit to Supine     Supine to sit: Modified independent (Device/Increase time) Sit to supine: Modified independent (Device/Increase time)      Transfers Overall transfer level: Needs assistance Equipment used: Rolling walker (2 wheeled) Transfers: Sit to/from Stand Sit to Stand: Supervision         General transfer comment: min cues for THP  Ambulation/Gait Ambulation/Gait assistance: Supervision Ambulation Distance (Feet): 160 Feet Assistive device: Rolling walker (2 wheeled) Gait Pattern/deviations: Step-to pattern;Decreased step length - right;Decreased step length - left;Shuffle Gait velocity: decr   General Gait Details: Pt demonstrating good awareness of PWB and THP    Stairs Stairs: Yes Stairs assistance: Min assist Stair Management: No rails;Step to pattern;Backwards;With walker Number of Stairs: 2 General stair comments: single step twice  Wheelchair Mobility    Modified Rankin (Stroke Patients Only)       Balance                                    Cognition  Arousal/Alertness: Awake/alert Behavior During Therapy: WFL for tasks assessed/performed Overall Cognitive Status: Within Functional Limits for tasks assessed                      Exercises Total Joint Exercises Ankle Circles/Pumps: AROM;Both;15 reps;Supine Quad Sets: AROM;Both;10 reps;Supine Gluteal Sets: AROM;Both;10 reps;Supine Heel Slides: AAROM;20 reps;Right;Supine Hip ABduction/ADduction: AAROM;Right;15 reps;Supine Long Arc Quad: AROM;Both;10 reps;Supine    General Comments        Pertinent Vitals/Pain Pain Assessment: 0-10 Pain Score: 4  Pain Location: R hip Pain Descriptors / Indicators: Aching;Sore Pain Intervention(s): Limited activity within patient's tolerance;Monitored during session;Premedicated before session;Ice applied    Home Living                      Prior Function            PT Goals (current goals can now be found in the care plan section) Acute Rehab PT Goals Patient Stated Goal: Get back to work--can't use RW there PT Goal Formulation: With patient Time For Goal Achievement: 06/20/14 Potential to Achieve Goals: Good Progress towards PT goals: Progressing toward goals    Frequency  7X/week    PT Plan Current plan remains appropriate    Co-evaluation             End of Session Equipment Utilized During Treatment: Gait belt Activity Tolerance: Patient tolerated treatment well Patient left: in bed;with call bell/phone within reach     Time: 0950-1037 PT Time Calculation (min) (ACUTE ONLY): 47 min  Charges:  $Gait Training:  23-37 mins $Therapeutic Exercise: 8-22 mins                    G Codes:      [name] [name] Jul 10, 2014, 12:41 PM

## 2014-06-17 NOTE — Progress Notes (Signed)
     Subjective: 2 Days Post-Op Procedure(s) (LRB): REMOVAL OF ANTIBIOTIC  SPACER AND CONVERSION TO RIGHT TOTAL HIP  ARTHROPLASTY  (Right)   Patient reports pain as mild, pain controlled. No events throughout the night. Ready to be discharged home if he does well with PT and pain stays controlled.   Objective:   VITALS:   Filed Vitals:   06/17/14 0559  BP: 103/70  Pulse: 59  Temp: 98.1 F (36.7 C)  Resp: 16    Dorsiflexion/Plantar flexion intact Incision: dressing C/D/I No cellulitis present Compartment soft  LABS  Recent Labs  06/16/14 0428 06/17/14 0435  HGB 10.9* 9.9*  HCT 33.5* 30.6*  WBC 7.2 9.7  PLT 154 145*     Recent Labs  06/16/14 0428 06/17/14 0435  NA 140 142  K 4.8 5.3*  BUN 14 23*  CREATININE 1.15 1.21  GLUCOSE 169* 174*     Assessment/Plan: 2 Days Post-Op Procedure(s) (LRB): REMOVAL OF ANTIBIOTIC  SPACER AND CONVERSION TO RIGHT TOTAL HIP  ARTHROPLASTY  (Right) Up with therapy Discharge home with home health  Follow up in 2 weeks at [facility]. Follow up with OLIN,Didier Brandenburg D in 2 weeks.  Contact information:  [facility] [address], [address] [place] A[place]   PAC  06/17/2014, 8:13 AM

## 2014-06-18 LAB — HIV-1 GENOTYPR PLUS

## 2014-06-21 ENCOUNTER — Emergency Department (HOSPITAL_COMMUNITY)
Admission: EM | Admit: 2014-06-21 | Discharge: 2014-06-23 | Disposition: A | Discharge status: Home / Self Care | Payer: No Typology Code available for payment source | Attending: Emergency Medicine | Admitting: Emergency Medicine

## 2014-06-21 ENCOUNTER — Emergency Department (HOSPITAL_COMMUNITY): Payer: No Typology Code available for payment source

## 2014-06-21 ENCOUNTER — Encounter (HOSPITAL_COMMUNITY): Payer: Self-pay

## 2014-06-21 DIAGNOSIS — Z85048 Personal history of other malignant neoplasm of rectum, rectosigmoid junction, and anus: Secondary | ICD-10-CM | POA: Insufficient documentation

## 2014-06-21 DIAGNOSIS — R112 Nausea with vomiting, unspecified: Secondary | ICD-10-CM | POA: Diagnosis not present

## 2014-06-21 DIAGNOSIS — Z923 Personal history of irradiation: Secondary | ICD-10-CM | POA: Insufficient documentation

## 2014-06-21 DIAGNOSIS — Z792 Long term (current) use of antibiotics: Secondary | ICD-10-CM | POA: Diagnosis not present

## 2014-06-21 DIAGNOSIS — R066 Hiccough: Secondary | ICD-10-CM | POA: Insufficient documentation

## 2014-06-21 DIAGNOSIS — Z79899 Other long term (current) drug therapy: Secondary | ICD-10-CM | POA: Diagnosis not present

## 2014-06-21 DIAGNOSIS — Z87891 Personal history of nicotine dependence: Secondary | ICD-10-CM | POA: Insufficient documentation

## 2014-06-21 DIAGNOSIS — Z872 Personal history of diseases of the skin and subcutaneous tissue: Secondary | ICD-10-CM | POA: Insufficient documentation

## 2014-06-21 DIAGNOSIS — M1990 Unspecified osteoarthritis, unspecified site: Secondary | ICD-10-CM | POA: Diagnosis not present

## 2014-06-21 DIAGNOSIS — Z7982 Long term (current) use of aspirin: Secondary | ICD-10-CM | POA: Diagnosis not present

## 2014-06-21 DIAGNOSIS — R109 Unspecified abdominal pain: Secondary | ICD-10-CM | POA: Diagnosis not present

## 2014-06-21 DIAGNOSIS — B20 Human immunodeficiency virus [HIV] disease: Secondary | ICD-10-CM | POA: Diagnosis not present

## 2014-06-21 DIAGNOSIS — R0602 Shortness of breath: Secondary | ICD-10-CM | POA: Insufficient documentation

## 2014-06-21 MED ORDER — DICYCLOMINE HCL 10 MG PO CAPS
20.0000 mg | ORAL_CAPSULE | Freq: Once | ORAL | Status: AC
  Administered 2014-06-22: 20 mg via ORAL
  Filled 2014-06-21: qty 2

## 2014-06-21 MED ORDER — DIPHENHYDRAMINE HCL 25 MG PO CAPS
25.0000 mg | ORAL_CAPSULE | Freq: Once | ORAL | Status: AC
  Administered 2014-06-22: 25 mg via ORAL
  Filled 2014-06-21: qty 1

## 2014-06-21 MED ORDER — METOCLOPRAMIDE HCL 5 MG/ML IJ SOLN
10.0000 mg | Freq: Once | INTRAMUSCULAR | Status: AC
Start: 2014-06-21 — End: 2014-06-22
  Administered 2014-06-22: 10 mg via INTRAVENOUS
  Filled 2014-06-21: qty 2

## 2014-06-21 MED ORDER — FAMOTIDINE IN NACL 20-0.9 MG/50ML-% IV SOLN
20.0000 mg | Freq: Once | INTRAVENOUS | Status: AC
  Administered 2014-06-22: 20 mg via INTRAVENOUS
  Filled 2014-06-21: qty 50

## 2014-06-21 NOTE — ED Provider Notes (Signed)
CSN: [id number]     Arrival date & time 06/21/14  2222 History   First MD Initiated Contact with Patient 06/21/14 2300     This chart was scribed for Everlene Balls, MD by Forrestine Him, ED Scribe. This patient was seen in room B18C/B18C and the patient's care was started 11:04 PM.   Chief Complaint  Patient presents with  . Hiccups   The history is provided by the patient. No language interpreter was used.    HPI Comments: TOBEY SCHMELZLE brought in by EMS is a 54 y.o. male with a PMHx of HIV and rectal cancer who presents to the Emergency Department complaining of intermittent, ongoing, progressively worsening hiccups x 6 days. Pt also reports stomach spasms, nausea, and 1 episode of vomiting en route to department. Mr. Blagg recent underwent hip replacement surgery 6/6 and noticed onset of symptoms shortly after surgery. Pt states mild hiccups are usually typical for him after surgeries, however, current episode is much more severe. 4 mg of Zofran given prior to arrival with mild relief. No recent fever, chills, chest pain, or shortness of breath. No current history of anticoagulation therapy. Pt with known allergy to Collagen.  Past Medical History  Diagnosis Date  . Radiation   . HIV (human immunodeficiency virus infection)   . Shingles     left shoulder, right leg  . Pain in limb 07/23/2013  . Rectal cancer     Squamous cell  . H/O hypotension   . Arthritis   . History of gout   . Hx of sepsis 2015    affected rt hip / femur  . Exposure to hepatitis B   . 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 History of transfusion   . Decubitus ulcer of sacral area    Past Surgical History  Procedure Laterality Date  . Hematoma evacuation    . Incision and drainage hip Right 07/31/2013    Procedure: IRRIGATION AND DEBRIDEMENT HIP WITH PERCUTANEOUS DRAIN PLACEMENT;  Surgeon: [name], MD;  Location: [place];  Service: Orthopedics;  Laterality: Right;  . Total hip  arthroplasty Right 08/04/2013    Procedure: RESECTION HIP JOINT - PLACEMENT OF CEMENT PROSTHESIS;  Surgeon: [name], MD;  Location: [place];  Service: Orthopedics;  Laterality: Right;  . Pilonidal cyst drainage N/A 08/09/2013    Procedure: IRRIGATION AND DEBRIDEMENT SACRAL DECUBITUS;  Surgeon: Harl Bowie, MD;  Location: [place];  Service: General;  Laterality: N/A;  . Incision and drainage of wound N/A 08/25/2013    Procedure: IRRIGATION AND DEBRIDEMENT OF SACRAL ULCER WITH PLACEMENT OF A CELL ;  Surgeon: Theodoro Kos, DO;  Location: [place];  Service: Plastics;  Laterality: N/A;  . Incision and drainage of wound N/A 09/01/2013    Procedure: IRRIGATION AND DEBRIDEMENT SACRAL ULCER WITH PLACEMENT OF A CELL;  Surgeon: Theodoro Kos, DO;  Location: [place];  Service: Plastics;  Laterality: N/A;  . Incision and drainage of wound N/A 09/08/2013    Procedure: IRRIGATION AND DEBRIDEMENT OF SACRAL ULCER WITH PLACEMENT OF A CELL AND VAC;  Surgeon: Theodoro Kos, DO;  Location: [place];  Service: Plastics;  Laterality: N/A;  . Incision and drainage of wound N/A 09/18/2013    Procedure: IRRIGATION AND DEBRIDEMENT OF SACRAL WOUND WITH PLACEMENT OF A-CELL;  Surgeon: Theodoro Kos, DO;  Location: [place];  Service: Plastics;  Laterality: N/A;  . Hernia repair      right  X2  . Transurethral resection of prostate  JAN 2016  . Cystoscopy    . Conversion to total hip Right 06/15/2014    Procedure: REMOVAL OF ANTIBIOTIC  SPACER AND CONVERSION TO RIGHT TOTAL HIP  ARTHROPLASTY ;  Surgeon: Paralee Cancel, MD;  Location: [facility];  Service: Orthopedics;  Laterality: Right;   Family History  Problem Relation Age of Onset  . Diabetes Mother   . Dementia Mother   . Atrial fibrillation Mother   . Hypertension Mother   . Cancer Father   . Emphysema Father    History  Substance Use Topics  . Smoking status: Former Smoker -- 0.30 packs/day    Types: Cigarettes    Quit date: 07/10/2013  . Smokeless tobacco: Never Used  .  Alcohol Use: No    Review of Systems  Constitutional: Negative for fever and chills.  Respiratory: Negative for cough, chest tightness and shortness of breath.   Cardiovascular: Negative for chest pain.  Gastrointestinal: Positive for nausea, vomiting and abdominal pain.  Musculoskeletal: Positive for arthralgias.  All other systems reviewed and are negative.     Allergies  Collagen  Home Medications   Prior to Admission medications   Medication Sig Start Date End Date Taking? Authorizing Provider  aspirin EC 325 MG EC tablet Take 1 tablet (325 mg total) by mouth 2 (two) times daily. 06/17/14 07/15/14  Danae Orleans, PA-C  ciprofloxacin (CIPRO) 500 MG tablet Take 500 mg by mouth 2 (two) times daily.    Historical Provider, MD  darunavir-cobicistat (PREZCOBIX) 800-150 MG per tablet Take 1 tablet by mouth daily. Swallow whole. Do NOT crush, break or chew tablets. Take with food. 12/03/13   [name], MD  docusate sodium (COLACE) 100 MG capsule Take 1 capsule (100 mg total) by mouth 2 (two) times daily. 06/17/14   Danae Orleans, PA-C  dolutegravir (TIVICAY) 50 MG tablet Take 1 tablet (50 mg total) by mouth daily. 12/01/13   [name], MD  doxycycline (VIBRAMYCIN) 100 MG capsule Take 100 mg by mouth 2 (two) times daily.    Historical Provider, MD  feeding supplement, ENSURE COMPLETE, (ENSURE COMPLETE) LIQD Take 237 mLs by mouth daily.    Historical Provider, MD  ferrous sulfate 325 (65 FE) MG tablet Take 1 tablet (325 mg total) by mouth 3 (three) times daily after meals. 06/17/14   Danae Orleans, PA-C  HYDROcodone-acetaminophen (NORCO) 7.5-325 MG per tablet Take 1-2 tablets by mouth every 4 (four) hours as needed for moderate pain. 06/17/14   Danae Orleans, PA-C  methocarbamol (ROBAXIN) 500 MG tablet Take 1 tablet (500 mg total) by mouth every 6 (six) hours as needed for muscle spasms. 06/17/14   Danae Orleans, PA-C  midodrine (PROAMATINE) 10 MG tablet Take 1 tablet (10 mg total)  by mouth 3 (three) times daily with meals. 08/13/13   Langley Gauss Moding, MD  polyethylene glycol (MIRALAX / GLYCOLAX) packet Take 17 g by mouth 2 (two) times daily. 06/17/14   Danae Orleans, PA-C  rilpivirine (EDURANT) 25 MG TABS tablet Take 1 tablet (25 mg total) by mouth daily with breakfast. 12/01/13   [name], MD  senna-docusate (SENNA PLUS) 8.6-50 MG per tablet Take 1 tablet by mouth 2 (two) times daily as needed for mild constipation.    Historical Provider, MD  vitamin C (ASCORBIC ACID) 500 MG tablet Take 500 mg by mouth daily.    Historical Provider, MD   Triage Vitals: BP 136/82 mmHg  Pulse 75  Resp 15  SpO2 100%   Physical Exam  Constitutional: He is oriented to person, place, and time. Vital signs are normal. He appears well-developed and well-nourished.  Non-toxic appearance. He does not appear ill. No distress.  Frequent hiccups   HENT:  Head: Normocephalic and atraumatic.  Nose: Nose normal.  Mouth/Throat: Oropharynx is clear and moist. No oropharyngeal exudate.  Eyes: Conjunctivae and EOM are normal. Pupils are equal, round, and reactive to light. No scleral icterus.  Neck: Normal range of motion. Neck supple. No tracheal deviation, no edema, no erythema and normal range of motion present. No thyroid mass and no thyromegaly present.  Cardiovascular: Normal rate, regular rhythm, S1 normal, S2 normal, normal heart sounds, intact distal pulses and normal pulses.  Exam reveals no gallop and no friction rub.   No murmur heard. Pulses:      Radial pulses are 2+ on the right side, and 2+ on the left side.       Dorsalis pedis pulses are 2+ on the right side, and 2+ on the left side.  Pulmonary/Chest: Effort normal and breath sounds normal. No respiratory distress. He has no wheezes. He has no rhonchi. He has no rales.  Abdominal: Soft. Normal appearance and bowel sounds are normal. He exhibits no distension, no ascites and no mass. There is no hepatosplenomegaly. There is no  tenderness. There is no rebound, no guarding and no CVA tenderness.  Musculoskeletal: Normal range of motion. He exhibits no edema or tenderness.  R hip with normal post op deformity  Lymphadenopathy:    He has no cervical adenopathy.  Neurological: He is alert and oriented to person, place, and time. He has normal strength. No cranial nerve deficit or sensory deficit.  Skin: Skin is warm, dry and intact. No petechiae and no rash noted. He is not diaphoretic. No erythema. No pallor.  Psychiatric: He has a normal mood and affect. His behavior is normal. Judgment normal.  Nursing note and vitals reviewed.   ED Course  Procedures (including critical care time)  DIAGNOSTIC STUDIES: Oxygen Saturation is 96% on RA, adequate by my interpretation.    COORDINATION OF CARE: 11:08 PM- Will give Reglan, Benadryl, Pepcid, and Bentyl. Will order CXR, d-dimer, CBC, CMP, and EKG. Discussed treatment plan with pt at bedside and pt agreed to plan.     Labs Review Labs Reviewed  CBC WITH DIFFERENTIAL/PLATELET - Abnormal; Notable for the following:    RBC 3.60 (*)    Hemoglobin 11.0 (*)    HCT 33.8 (*)    Neutrophils Relative % 81 (*)    Lymphocytes Relative 10 (*)    All other components within normal limits  COMPREHENSIVE METABOLIC PANEL - Abnormal; Notable for the following:    Total Protein 6.3 (*)    ALT 14 (*)    All other components within normal limits  D-DIMER, QUANTITATIVE (NOT AT [facility]) - Abnormal; Notable for the following:    D-Dimer, Quant 1.02 (*)    All other components within normal limits  LIPASE, BLOOD    Imaging Review Dg Chest 2 View  06/22/2014   CLINICAL DATA:  Constant checkups and vomiting. Patient had hip replacement on Monday and has been feeling sick since.  EXAM: CHEST  2 VIEW  COMPARISON:  08/14/2013  FINDINGS: The heart size and mediastinal contours are within normal limits. Both lungs are clear. The visualized skeletal structures are unremarkable.  IMPRESSION: No  active cardiopulmonary disease.   Electronically Signed   By: Gwyndolyn Saxon  Gerilyn Nestle M.D.   On: 06/22/2014 00:46   Ct Angio Chest Pe W/cm &/or Wo Cm  06/22/2014   CLINICAL DATA:  Acute onset of nausea, vomiting, hiccups and diaphragmatic pain, after right hip surgery. Initial encounter.  EXAM: CT ANGIOGRAPHY CHEST WITH CONTRAST  TECHNIQUE: Multidetector CT imaging of the chest was performed using the standard protocol during bolus administration of intravenous contrast. Multiplanar CT image reconstructions and MIPs were obtained to evaluate the vascular anatomy.  CONTRAST:  132mL OMNIPAQUE IOHEXOL 350 MG/ML SOLN  COMPARISON:  Chest radiograph performed 06/21/2014  FINDINGS: There is no evidence of pulmonary embolus.  Scattered blebs are noted at the lung apices. Mild left-sided peripheral atelectasis or scarring is noted. Minimal scarring and calcification are noted at the lung apices. The lungs are otherwise clear. There is no evidence of significant focal consolidation, pleural effusion or pneumothorax. No masses are identified; no abnormal focal contrast enhancement is seen.  There is relatively diffuse wall thickening along the mid to distal esophagus, concerning for esophagitis. An underlying mass cannot be entirely excluded, given the somewhat irregular appearance. The mediastinum is otherwise unremarkable. No mediastinal lymphadenopathy is seen. No pericardial effusion is identified. The great vessels are grossly unremarkable in appearance. No axillary lymphadenopathy is seen. The visualized portions of the thyroid gland are unremarkable in appearance.  The visualized portions of the liver and spleen are unremarkable.  No acute osseous abnormalities are seen.  Review of the MIP images confirms the above findings.  IMPRESSION: 1. No evidence of pulmonary embolus. 2. Relatively diffuse wall thickening along the mid to distal esophagus, concerning for esophagitis. The appearance is somewhat irregular, and an  underlying mass cannot be entirely excluded. Would consider endoscopy for further evaluation after completion of treatment for esophagitis, as deemed clinically appropriate. 3. Scattered blebs at the lung apices. Mild left-sided peripheral atelectasis or scarring noted. Minimal scarring and calcification at the lung apices.   Electronically Signed   By: Garald Balding M.D.   On: 06/22/2014 03:21     EKG Interpretation   Date/Time:  Sunday June 21 2014 22:27:43 EDT Ventricular Rate:  70 PR Interval:  136 QRS Duration: 100 QT Interval:  406 QTC Calculation: 438 R Axis:   78 Text Interpretation:  Sinus rhythm RSR' in V1 or V2, probably normal  variant Nonspecific T abnrm, anterolateral leads No significant change  since last tracing Confirmed by Glynn Octave [phone]) on 06/21/2014  10:59:56 PM      MDM   Final diagnoses:  None    patient since emergency department for intractable hiccups for the past week. He is also having pain in his abdomen. He was given Reglan, famotidine, bentyl emergency department. Will obtain chest x-ray to evaluate for any  Effusions that may be irritating the diaphragm. There is sent by surgery to have a d-dimer drawn as well.   upon repeat evaluation, hiccups have resolved. This is likely due to Reglan given. D-dimer is elevated however, will obtain CT scan to assess for pulmonary embolism.  CTA neg for PE but shows esophagitis.  This is likely due to his current symptoms, however mass can not be excluded.  He has a h/o skin cancer.  This information was relayed to the patient in addition to having repeat evaluation in 6 months.  He demonstrated understanding.  Will DC with reglan which likely improved his symptoms in the ED.  I personally performed the services described in this documentation, which was scribed in my presence. The recorded  information has been reviewed and is accurate.   Everlene Balls, MD 06/22/14 1535

## 2014-06-21 NOTE — ED Notes (Signed)
Pt arrived via EMS from home c/o hiccups since right hip surgery on 06/15/14, n/v, and pain at diaphragm.  EMS gave 4mg  of zofran with a little relief.

## 2014-06-22 ENCOUNTER — Encounter (HOSPITAL_COMMUNITY): Payer: Self-pay | Admitting: Radiology

## 2014-06-22 ENCOUNTER — Emergency Department (HOSPITAL_COMMUNITY): Payer: No Typology Code available for payment source

## 2014-06-22 LAB — CBC WITH DIFFERENTIAL/PLATELET
Basophils Absolute: 0 10*3/uL (ref 0.0–0.1)
Basophils Relative: 0 % (ref 0–1)
Eosinophils Absolute: 0.1 10*3/uL (ref 0.0–0.7)
Eosinophils Relative: 1 % (ref 0–5)
HEMATOCRIT: 33.8 % — AB (ref 39.0–52.0)
HEMOGLOBIN: 11 g/dL — AB (ref 13.0–17.0)
LYMPHS PCT: 10 % — AB (ref 12–46)
Lymphs Abs: 1 10*3/uL (ref 0.7–4.0)
MCH: 30.6 pg (ref 26.0–34.0)
MCHC: 32.5 g/dL (ref 30.0–36.0)
MCV: 93.9 fL (ref 78.0–100.0)
MONO ABS: 0.7 10*3/uL (ref 0.1–1.0)
MONOS PCT: 8 % (ref 3–12)
NEUTROS ABS: 7.6 10*3/uL (ref 1.7–7.7)
Neutrophils Relative %: 81 % — ABNORMAL HIGH (ref 43–77)
Platelets: 153 10*3/uL (ref 150–400)
RBC: 3.6 MIL/uL — ABNORMAL LOW (ref 4.22–5.81)
RDW: 15 % (ref 11.5–15.5)
WBC: 9.4 10*3/uL (ref 4.0–10.5)

## 2014-06-22 LAB — COMPREHENSIVE METABOLIC PANEL
ALK PHOS: 51 U/L (ref 38–126)
ALT: 14 U/L — ABNORMAL LOW (ref 17–63)
ANION GAP: 7 (ref 5–15)
AST: 18 U/L (ref 15–41)
Albumin: 3.7 g/dL (ref 3.5–5.0)
BUN: 20 mg/dL (ref 6–20)
CO2: 27 mmol/L (ref 22–32)
CREATININE: 1.21 mg/dL (ref 0.61–1.24)
Calcium: 9.6 mg/dL (ref 8.9–10.3)
Chloride: 104 mmol/L (ref 101–111)
GFR calc non Af Amer: >60 mL/min (ref >60)
GLUCOSE: 96 mg/dL (ref 65–99)
Potassium: 4.8 mmol/L (ref 3.5–5.1)
SODIUM: 138 mmol/L (ref 135–145)
Total Bilirubin: 0.5 mg/dL (ref 0.3–1.2)
Total Protein: 6.3 g/dL — ABNORMAL LOW (ref 6.5–8.1)

## 2014-06-22 LAB — LIPASE, BLOOD: LIPASE: 23 U/L (ref 22–51)

## 2014-06-22 LAB — D-DIMER, QUANTITATIVE: D-Dimer, Quant: 1.02 ug/mL-FEU — ABNORMAL HIGH (ref 0.00–0.48)

## 2014-06-22 MED ORDER — IOHEXOL 350 MG/ML SOLN
100.0000 mL | Freq: Once | INTRAVENOUS | Status: AC | PRN
  Administered 2014-06-22: 100 mL via INTRAVENOUS

## 2014-06-22 MED ORDER — RILPIVIRINE HCL 25 MG PO TABS
25.0000 mg | ORAL_TABLET | Freq: Once | ORAL | Status: AC
  Administered 2014-06-22: 25 mg via ORAL
  Filled 2014-06-22: qty 1

## 2014-06-22 MED ORDER — DOLUTEGRAVIR SODIUM 50 MG PO TABS
50.0000 mg | ORAL_TABLET | Freq: Once | ORAL | Status: AC
  Administered 2014-06-22: 50 mg via ORAL
  Filled 2014-06-22: qty 1

## 2014-06-22 MED ORDER — METOCLOPRAMIDE HCL 10 MG PO TABS: 10.0000 mg | ORAL_TABLET | Freq: Three times a day (TID) | ORAL | Status: DC | PRN

## 2014-06-22 MED ORDER — DARUNAVIR-COBICISTAT 800-150 MG PO TABS
1.0000 | ORAL_TABLET | Freq: Once | ORAL | Status: AC
  Administered 2014-06-22: 1 via ORAL
  Filled 2014-06-22: qty 1

## 2014-06-22 MED ORDER — SODIUM CHLORIDE 0.9 % IV BOLUS (SEPSIS)
1000.0000 mL | Freq: Once | INTRAVENOUS | Status: AC
Start: 2014-06-22 — End: 2014-06-22
  Administered 2014-06-22: 1000 mL via INTRAVENOUS

## 2014-06-22 MED ORDER — DOLUTEGRAVIR SODIUM 50 MG PO TABS: 50.0000 mg | ORAL_TABLET | Freq: Every day | ORAL | Status: DC

## 2014-06-22 MED ORDER — RILPIVIRINE HCL 25 MG PO TABS: 25.0000 mg | ORAL_TABLET | Freq: Every day | ORAL | Status: DC

## 2014-06-22 MED ORDER — DARUNAVIR-COBICISTAT 800-150 MG PO TABS: 1.0000 | ORAL_TABLET | Freq: Every day | ORAL | Status: DC

## 2014-06-22 NOTE — Discharge Instructions (Signed)
[name],  Your CT scan did not show any fluid or blood clots causing your hiccups. It did show esophagitis and a possible mass.  You need to have this evaluated further with a GI doctor.  Take Reglan as prescribed to help with your symptoms. See your doctor within 3 days for close follow-up. If any symptoms worsen come back to emergency department immediately. Thank you.  IMPRESSION: 1. No evidence of pulmonary embolus. 2. Relatively diffuse wall thickening along the mid to distal esophagus, concerning for esophagitis. The appearance is somewhat irregular, and an underlying mass cannot be entirely excluded. Would consider endoscopy for further evaluation after completion of treatment for esophagitis, as deemed clinically appropriate. 3. Scattered blebs at the lung apices. Mild left-sided peripheral atelectasis or scarring noted. Minimal scarring and calcification at the lung apices.  A hiccup is the result of a sudden shortening of the muscle below your lungs (diaphragm). This movement of your diaphragm is then followed by the closing of your vocal cords, which causes the hiccup sound. Most people get the hiccups. Typically, hiccups last only a short amount of time. There are three types of hiccups:   Benign: last less than 48 hours.  Persistent: last more than 48 hours, but less than 1 month.  Intractable: last more than 1 month. A hiccup is a reflex. You cannot control reflexes. CAUSES  Causes of the hiccups can include:   Eating too much.  Drinking too much alcohol or fizzy drinks.  Eating too fast.  Eating or drinking hot and spicy foods or drinks.  Using certain medicines that have hiccupping as a side effect. Several medical conditions may also cause hiccups, including, but not limited to:  Stroke.  Gastroesophageal reflux.  Multiple sclerosis.  Traumatic brain injury.  Brain tumor.  Meningitis.  Having damage to the nerve that affects the  diaphragm. Usually, though, hiccups have no apparent cause and are not the result of a serious medical condition. DIAGNOSIS  Tests may be performed to diagnose a possible condition associated with persistent or intractable hiccups. TREATMENT  Most cases of the hiccups need no treatment. None of the numerous home remedies have been proven to be effective. If your hiccups do require treatment, your treatment may include:  Medicine. Medicine may be given intravenously (by IV) or by mouth.  Hypnosis or acupuncture.  Surgery to the nerve that affects the diaphragm may be tried in severe cases. If your hiccups are caused by an underlying medical condition, treatment for the medical condition may be necessary.  HOME CARE INSTRUCTIONS   Eat small meals.  Limit alcohol intake to no more than 1 drink per day for nonpregnant women and 2 drinks per day for men. One drink equals 12 ounces of beer, 5 ounces of wine, or 1 ounces of hard liquor.  Limit drinking fizzy drinks.  Eat and chew your food slowly.  Take medicines only as directed by your health care provider. SEEK MEDICAL CARE IF:   Your hiccups last for more than 48 hours.  You are given medicine, but your hiccups do not get better.  You cannot sleep or eat due to the hiccups.  You have unexpected weight loss due to the hiccups.  You have trouble breathing or swallowing.  You have a fever.  You develop severe pain in your abdomen.  You develop numbness, tingling, or weakness. Document Released: 03/06/2001 Document Revised: 05/12/2013 Document Reviewed: 02/16/2010 [facility] Patient Information 2015 [place], [place]. This information is not intended  to replace advice given to you by your health care provider. Make sure you discuss any questions you have with your health care provider. ° °

## 2014-06-22 NOTE — Discharge Summary (Signed)
Physician Discharge Summary  Patient ID: [name] [name] MRN: [id number] DOB/AGE: 1960-04-20 54 y.o.  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 Radiation   . HIV (human immunodeficiency virus infection)   . Shingles     left shoulder, right leg  . Pain in limb 07/23/2013  . Rectal cancer     Squamous cell  . H/O hypotension   . Arthritis   . History of gout   . Hx of sepsis 2015    affected rt hip / femur  . Exposure to hepatitis B   . 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 History of transfusion   . Decubitus ulcer of sacral area     HPI:    [name] [name] is a 54 year old male with multiple medical issues including HIV with natively infected right hip joint that was resected and placement of a temporary antibiotic spacer with Prostalac and antibiotics. He has also had an associated sacral decubitus that has been treated and managed through [name] [name]. [name] [name] have spoken to her regarding his case and she feels at this point, he is cleared for surgery. He is scheduled for surgery on the 6 of June currently. [name] [name] did speak to [name] [name] about his overall health and she had suggested that we evaluate his nutritional status by evaluating the albumin level. We had performed that and it was within normal limits. He is seen and evaluated in the office. He still uses assisted device for mobilization. He tolerated the hip range of motion well, but does have some discomfort to palpation about the right hip area. No signs of  any fluctuance or infection. The sacral wound is healing with small dressing over top of it noted without complication. I have reviewed [name] [name]'s current situation. His albumin level was within normal limits as identified through lab work. His sedimentation rate and C-reactive protein have come down appropriate. At this point, I feel that it is appropriate based on our discussion with [name] [name], to proceed with re-implantation of his right hip, scheduling on June 6. Risks, benefits and expectations were discussed with the patient. Risks including but not limited to the risk of anesthesia, blood clots, nerve damage, blood vessel damage, failure of the prosthesis, infection and up to and including death. Patient understand the risks, benefits and expectations and wishes to proceed with surgery.   PCP: No primary care provider on file.   Discharged Condition: good  Hospital Course:  Patient underwent the above stated procedure on 06/15/2014. Patient tolerated the procedure well and brought to the recovery room in good condition and subsequently to the floor.  POD #1 BP: 91/52 ; Pulse: 52 ; Temp: 98.2 F (36.8 C) ; Resp: 16 Patient reports pain as mild. A bit sore but otherwise OK. Neurovascular intact and incision: dressing C/D/I  LABS  Basename    HGB  10.9  HCT  33.5   POD #2  BP: 103/70 ; Pulse: 59 ; Temp: 98.1 F (36.7 C) ; Resp:  16 Patient reports pain as mild, pain controlled. No events throughout the night. Ready to be discharged home. Dorsiflexion/plantar flexion intact, incision: dressing C/D/I, no cellulitis present and compartment soft.   LABS  Basename    HGB  9.9  HCT  30.6    Discharge Exam: General appearance: alert, cooperative and no distress Extremities: Homans sign is negative, no sign of DVT, no edema, redness or tenderness in the calves or thighs and no ulcers, gangrene or trophic changes  Disposition: Home with follow up in 2 weeks   Follow-up Information     Follow up with [facility].   Why:  home health physical therapy   Contact information:   [address] [place] [address] [phone]       Follow up with [name], MD. Schedule an appointment as soon as possible for a visit in 2 weeks.   Specialty:  Orthopedic Surgery   Contact information:   [address] [place] [address] [phone]       Discharge Instructions    Call MD / Call 911    Complete by:  As directed   If you experience chest pain or shortness of breath, CALL 911 and be transported to the hospital emergency room.  If you develope a fever above 101 F, pus (white drainage) or increased drainage or redness at the wound, or calf pain, call your surgeon's office.     Change dressing    Complete by:  As directed   Maintain surgical dressing until follow up in the clinic. If the edges start to pull up, may reinforce with tape. If the dressing is no longer working, may remove and cover with gauze and tape, but must keep the area dry and clean.  Call with any questions or concerns.     Constipation Prevention    Complete by:  As directed   Drink plenty of fluids.  Prune juice may be helpful.  You may use a stool softener, such as Colace (over the counter) 100 mg twice a day.  Use MiraLax (over the counter) for constipation as needed.     Diet - low sodium heart healthy    Complete by:  As directed      Discharge instructions    Complete by:  As directed   Maintain surgical dressing until follow up in the clinic. If the edges start to pull up, may reinforce with tape. If the dressing is no longer working, may remove and cover with gauze and tape, but must keep the area dry and clean.  Follow up in 2 weeks at [facility]. Call with any questions or concerns.     Increase activity slowly as tolerated    Complete by:  As directed      TED hose    Complete by:  As directed   Use stockings (TED hose) for 2 weeks  on both leg(s).  You may remove them at night for sleeping.     Weight bearing as tolerated    Complete by:  As directed   Laterality:  right  Extremity:  Lower             Medication List    STOP taking these medications        ibuprofen 200 MG tablet  Commonly known as:  ADVIL,MOTRIN     midodrine 10 MG tablet  Commonly known as:  PROAMATINE      TAKE these medications  aspirin 325 MG EC tablet  Take 1 tablet (325 mg total) by mouth 2 (two) times daily.     ciprofloxacin 500 MG tablet  Commonly known as:  CIPRO  Take 500 mg by mouth 2 (two) times daily.     darunavir-cobicistat 800-150 MG per tablet  Commonly known as:  PREZCOBIX  Take 1 tablet by mouth daily. Swallow whole. Do NOT crush, break or chew tablets. Take with food.     docusate sodium 100 MG capsule  Commonly known as:  COLACE  Take 1 capsule (100 mg total) by mouth 2 (two) times daily.     dolutegravir 50 MG tablet  Commonly known as:  TIVICAY  Take 1 tablet (50 mg total) by mouth daily.     doxycycline 100 MG capsule  Commonly known as:  VIBRAMYCIN  Take 100 mg by mouth 2 (two) times daily.     feeding supplement (ENSURE COMPLETE) Liqd  Take 237 mLs by mouth 3 (three) times daily with meals.     ferrous sulfate 325 (65 FE) MG tablet  Take 1 tablet (325 mg total) by mouth 3 (three) times daily after meals.     HYDROcodone-acetaminophen 7.5-325 MG per tablet  Commonly known as:  NORCO  Take 1-2 tablets by mouth every 4 (four) hours as needed for moderate pain.     methocarbamol 500 MG tablet  Commonly known as:  ROBAXIN  Take 1 tablet (500 mg total) by mouth every 6 (six) hours as needed for muscle spasms.     polyethylene glycol packet  Commonly known as:  MIRALAX / GLYCOLAX  Take 17 g by mouth 2 (two) times daily.     rilpivirine 25 MG Tabs tablet  Commonly known as:  EDURANT  Take 1 tablet (25 mg total) by mouth daily with breakfast.     SENNA PLUS 8.6-50 MG per tablet    Generic drug:  senna-docusate  Take 1 tablet by mouth 2 (two) times daily as needed for mild constipation.     vitamin C 500 MG tablet  Commonly known as:  ASCORBIC ACID  Take 500 mg by mouth daily.         Signed: West Pugh. Buffey Zabinski   PA-C  06/22/2014, 9:48 AM

## 2014-06-22 NOTE — ED Notes (Signed)
Pt stable, ambulatory, states understanding of discharge instructions 

## 2014-06-23 NOTE — ED Notes (Signed)
Pt not here discharged 06/22/14 at 0430

## 2014-06-29 ENCOUNTER — Encounter (HOSPITAL_BASED_OUTPATIENT_CLINIC_OR_DEPARTMENT_OTHER): Payer: No Typology Code available for payment source | Attending: Plastic Surgery

## 2014-06-29 DIAGNOSIS — Z872 Personal history of diseases of the skin and subcutaneous tissue: Secondary | ICD-10-CM | POA: Diagnosis present

## 2014-06-29 DIAGNOSIS — Z85048 Personal history of other malignant neoplasm of rectum, rectosigmoid junction, and anus: Secondary | ICD-10-CM | POA: Diagnosis not present

## 2014-06-29 DIAGNOSIS — Z96641 Presence of right artificial hip joint: Secondary | ICD-10-CM | POA: Diagnosis not present

## 2014-06-29 DIAGNOSIS — Z923 Personal history of irradiation: Secondary | ICD-10-CM | POA: Insufficient documentation

## 2014-09-10 ENCOUNTER — Other Ambulatory Visit: Payer: No Typology Code available for payment source

## 2014-09-10 DIAGNOSIS — B20 Human immunodeficiency virus [HIV] disease: Secondary | ICD-10-CM

## 2014-09-10 LAB — COMPLETE METABOLIC PANEL WITH GFR
ALBUMIN: 4.1 g/dL (ref 3.6–5.1)
ALT: 8 U/L — ABNORMAL LOW (ref 9–46)
AST: 13 U/L (ref 10–35)
Alkaline Phosphatase: 76 U/L (ref 40–115)
BUN: 18 mg/dL (ref 7–25)
CO2: 25 mmol/L (ref 20–31)
Calcium: 9.9 mg/dL (ref 8.6–10.3)
Chloride: 106 mmol/L (ref 98–110)
Creat: 1.08 mg/dL (ref 0.70–1.33)
GFR, Est African American: >89 mL/min (ref >=60)
GFR, Est Non African American: 78 mL/min (ref >=60)
Glucose, Bld: 87 mg/dL (ref 65–99)
POTASSIUM: 4.5 mmol/L (ref 3.5–5.3)
Sodium: 144 mmol/L (ref 135–146)
TOTAL PROTEIN: 7.2 g/dL (ref 6.1–8.1)
Total Bilirubin: 0.5 mg/dL (ref 0.2–1.2)

## 2014-09-11 LAB — CBC
HEMATOCRIT: 37.6 % — AB (ref 39.0–52.0)
HEMOGLOBIN: 12.4 g/dL — AB (ref 13.0–17.0)
MCH: 30.5 pg (ref 26.0–34.0)
MCHC: 33 g/dL (ref 30.0–36.0)
MCV: 92.4 fL (ref 78.0–100.0)
MPV: 8.4 fL — ABNORMAL LOW (ref 8.6–12.4)
Platelets: 262 10*3/uL (ref 150–400)
RBC: 4.07 MIL/uL — ABNORMAL LOW (ref 4.22–5.81)
RDW: 14.8 % (ref 11.5–15.5)
WBC: 6.1 10*3/uL (ref 4.0–10.5)

## 2014-09-11 LAB — HIV-1 RNA QUANT-NO REFLEX-BLD
HIV 1 RNA QUANT: 97 {copies}/mL — AB (ref <20)
HIV-1 RNA QUANT, LOG: 1.99 {Log} — AB (ref <1.30)

## 2014-09-11 LAB — T-HELPER CELL (CD4) - (RCID CLINIC ONLY)
CD4 T CELL HELPER: 16 % — AB (ref 33–55)
CD4 T Cell Abs: 240 /uL — ABNORMAL LOW (ref 400–2700)

## 2014-09-23 ENCOUNTER — Ambulatory Visit (INDEPENDENT_AMBULATORY_CARE_PROVIDER_SITE_OTHER): Payer: No Typology Code available for payment source | Admitting: Infectious Diseases

## 2014-09-23 ENCOUNTER — Encounter: Payer: Self-pay | Admitting: Infectious Diseases

## 2014-09-23 VITALS — BP 111/79 | HR 82 | Temp 98.1°F | Ht 74.0 in | Wt 146.0 lb

## 2014-09-23 DIAGNOSIS — Z23 Encounter for immunization: Secondary | ICD-10-CM

## 2014-09-23 DIAGNOSIS — Z96649 Presence of unspecified artificial hip joint: Secondary | ICD-10-CM

## 2014-09-23 DIAGNOSIS — B20 Human immunodeficiency virus [HIV] disease: Secondary | ICD-10-CM | POA: Diagnosis not present

## 2014-09-23 DIAGNOSIS — M009 Pyogenic arthritis, unspecified: Secondary | ICD-10-CM

## 2014-09-23 DIAGNOSIS — K029 Dental caries, unspecified: Secondary | ICD-10-CM | POA: Diagnosis not present

## 2014-09-23 DIAGNOSIS — E43 Unspecified severe protein-calorie malnutrition: Secondary | ICD-10-CM

## 2014-09-23 DIAGNOSIS — C210 Malignant neoplasm of anus, unspecified: Secondary | ICD-10-CM

## 2014-09-23 DIAGNOSIS — Z79899 Other long term (current) drug therapy: Secondary | ICD-10-CM

## 2014-09-23 DIAGNOSIS — Z113 Encounter for screening for infections with a predominantly sexual mode of transmission: Secondary | ICD-10-CM

## 2014-09-23 DIAGNOSIS — Z9660 Presence of unspecified orthopedic joint implant: Secondary | ICD-10-CM | POA: Diagnosis not present

## 2014-09-23 NOTE — Assessment & Plan Note (Signed)
Hip has been revised, will remain on doxy til end of year.

## 2014-09-23 NOTE — Assessment & Plan Note (Signed)
Encourage him to have dental f/u

## 2014-09-23 NOTE — Assessment & Plan Note (Signed)
He is doing better, still with some detectable VL.  Appreciate pharm eval.  Change to taking ART with meal.  Needs dental, he wishes to defer til he gets through PT.

## 2014-09-23 NOTE — Assessment & Plan Note (Signed)
He will need surgical f/u.  Will re-address at next visit.

## 2014-09-23 NOTE — Assessment & Plan Note (Signed)
His wt is up, encouraged pt.

## 2014-09-23 NOTE — Assessment & Plan Note (Signed)
Doing well post revision.  Greatly appreciate ortho f/u.

## 2014-09-23 NOTE — Progress Notes (Signed)
Patient ID: [name] [name], male   DOB: May 10, 1960, 54 y.o.   MRN: [id number] HPI: [name] [name] is a 54 y.o. male who is here for his routine visit.   Allergies: Allergies  Allergen Reactions  . Collagen     redness    Vitals: Temp: 98.1 F (36.7 C) (09/14 1040) Temp Source: Oral (09/14 1040) BP: 111/79 mmHg (09/14 1040) Pulse Rate: 82 (09/14 1040)  Past Medical History: Past Medical History  Diagnosis Date  . Radiation   . HIV (human immunodeficiency virus infection)   . Shingles     left shoulder, right leg  . Pain in limb 07/23/2013  . Rectal cancer     Squamous cell  . H/O hypotension   . Arthritis   . History of gout   . Hx of sepsis 2015    affected rt hip / femur  . Exposure to hepatitis B   . 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 History of transfusion   . Decubitus ulcer of sacral area     Social History: Social History   Social History  . Marital Status: Single    Spouse Name: N/A  . Number of Children: 0  . Years of Education: BA   Occupational History  . disabled    Social History Main Topics  . Smoking status: Former Smoker -- 0.30 packs/day    Types: Cigarettes    Quit date: 07/10/2013  . Smokeless tobacco: Never Used  . Alcohol Use: No  . Drug Use: No     Comment: stopped 07/10/13  . Sexual Activity: No     Comment: declined condoms   Other Topics Concern  . None   Social History Narrative    Previous Regimen:   Current Regimen: RPV/PREZ/DTG  Labs: HIV 1 RNA QUANT (copies/mL)  Date Value  09/10/2014 97*  06/10/2014 1762*  05/06/2014 271*   CD4 T CELL ABS (/uL)  Date Value  09/10/2014 240*  06/10/2014 360*  05/06/2014 320*   HEP B S AB (no units)  Date Value  03/05/2006 YES   HEPATITIS B SURFACE AG (no units)  Date Value  03/05/2006 NO   HCV AB (no units)  Date Value  03/05/2006 INDETER    CrCl: Estimated Creatinine Clearance: 74.9 mL/min (by C-G formula based on Cr of 1.08).  Lipids:   Component Value Date/Time   CHOL 199 02/02/2014 1202   TRIG 145 02/02/2014 1202   HDL 55 02/02/2014 1202   CHOLHDL 3.6 02/02/2014 1202   VLDL 29 02/02/2014 1202   LDLCALC 115* 02/02/2014 1202    Assessment: 54 yo who has a pretty extensive hx of resistance. He has resistance to all NRTIs except for AZT, resistance to NNRTIs except for RPV/ETR, sensitive to PIs. He stated that he has been doing well and has only missed one dose a couple of months ago. He has been taking it at bedtime so I told to begin taking it with dinner instead. He agreed to it. He did have a blip in his VL in June but it has come down to 97 this month. Told him to be compliance to preserve his last line regimen.   Recommendations:  Cont RPV/DTG/Prez  Wilfred Lacy, PharmD Clinical Infectious [facility] 09/23/2014, 11:18 AM

## 2014-09-23 NOTE — Progress Notes (Signed)
   Subjective:    Patient ID: [name] [name], male    DOB: [birth year], 54 y.o.   MRN: [id number]  HPI 54 yo M with HIV/AIDS, and anal CA (2008). Has been doing well. No problems with meds- Prezcobix/RPV/DTGV qday. He has previously been on: ATVr/TFV/CBV, EFV, KLT.   Has been ETOH and tobacco free for > 1 year.  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 He was septic, requiring dopamine initially. He underwent resection of his R hip and debridement of his decubitus ulcer. His Cx grew group B Strep. He was d/c to [facility] to complete 6 weeks of ceftriaxone.  He underwent R THR on 06-15-14.  He has finished home PT and has ortho f/u next month. He is going to PT biw til then. Is excieted that he graduated to a cain.   HIV 1 RNA QUANT (copies/mL)  Date Value  09/10/2014 97*  06/10/2014 1762*  05/06/2014 271*   CD4 T CELL ABS (/uL)  Date Value  09/10/2014 240*  06/10/2014 360*  05/06/2014 320*    Has only missed one dose of ART since change. Is taking meds with food.  Has uro f/u, has weakening of his stream. Had TURP 01-2014. No further stones that he is ware of. More notcuria.   Review of Systems  Constitutional: Negative for appetite change and unexpected weight change.  Gastrointestinal: Negative for diarrhea and constipation.  Genitourinary: Negative for difficulty urinating.  Please see HPI. 12 point ROS o/w (-)     Objective:   Physical Exam  Constitutional: He appears well-developed and well-nourished.  HENT:  Mouth/Throat: No oropharyngeal exudate.  Eyes: EOM are normal. Pupils are equal, round, and reactive to light.  Neck: Neck supple.  Cardiovascular: Normal rate, regular rhythm and normal heart sounds.   Pulmonary/Chest: Effort normal and breath sounds normal.  Abdominal: Soft. Bowel sounds are normal. There is no tenderness. There is no rebound.  Lymphadenopathy:    He has no cervical adenopathy.       Assessment &  Plan:

## 2014-11-11 ENCOUNTER — Other Ambulatory Visit: Payer: Self-pay | Admitting: *Deleted

## 2014-11-11 DIAGNOSIS — B20 Human immunodeficiency virus [HIV] disease: Secondary | ICD-10-CM

## 2014-11-11 MED ORDER — RILPIVIRINE HCL 25 MG PO TABS: 25.0000 mg | ORAL_TABLET | Freq: Every day | ORAL | Status: DC

## 2014-11-11 MED ORDER — DARUNAVIR-COBICISTAT 800-150 MG PO TABS: 1.0000 | ORAL_TABLET | Freq: Every day | ORAL | Status: DC

## 2014-11-11 MED ORDER — DOLUTEGRAVIR SODIUM 50 MG PO TABS: 50.0000 mg | ORAL_TABLET | Freq: Every day | ORAL | Status: DC

## 2014-11-11 NOTE — Telephone Encounter (Signed)
Mailing the pt co-pay cards for his HIV medications.  Pt had been using a [employer] card and [employer] if no longer able to help.  Pt shared that his [employer] insurance will be changing at the end of the year.  He is going to be calling Gwynn[facility] Munson for an appointment.

## 2015-02-24 ENCOUNTER — Other Ambulatory Visit: Payer: BLUE CROSS/BLUE SHIELD

## 2015-02-24 DIAGNOSIS — B20 Human immunodeficiency virus [HIV] disease: Secondary | ICD-10-CM

## 2015-02-24 DIAGNOSIS — Z113 Encounter for screening for infections with a predominantly sexual mode of transmission: Secondary | ICD-10-CM

## 2015-02-24 DIAGNOSIS — Z79899 Other long term (current) drug therapy: Secondary | ICD-10-CM

## 2015-02-24 LAB — CBC
HEMATOCRIT: 36 % — AB (ref 39.0–52.0)
Hemoglobin: 11.6 g/dL — ABNORMAL LOW (ref 13.0–17.0)
MCH: 29.9 pg (ref 26.0–34.0)
MCHC: 32.2 g/dL (ref 30.0–36.0)
MCV: 92.8 fL (ref 78.0–100.0)
MPV: 8.9 fL (ref 8.6–12.4)
Platelets: 212 10*3/uL (ref 150–400)
RBC: 3.88 MIL/uL — ABNORMAL LOW (ref 4.22–5.81)
RDW: 16.1 % — ABNORMAL HIGH (ref 11.5–15.5)
WBC: 7 10*3/uL (ref 4.0–10.5)

## 2015-02-25 ENCOUNTER — Telehealth: Payer: Self-pay

## 2015-02-25 LAB — COMPREHENSIVE METABOLIC PANEL
ALK PHOS: 81 U/L (ref 40–115)
ALT: 7 U/L — AB (ref 9–46)
AST: 14 U/L (ref 10–35)
Albumin: 3.7 g/dL (ref 3.6–5.1)
BILIRUBIN TOTAL: 0.6 mg/dL (ref 0.2–1.2)
BUN: 14 mg/dL (ref 7–25)
CALCIUM: 9.3 mg/dL (ref 8.6–10.3)
CO2: 29 mmol/L (ref 20–31)
Chloride: 102 mmol/L (ref 98–110)
Creat: 1.46 mg/dL — ABNORMAL HIGH (ref 0.70–1.33)
Glucose, Bld: 66 mg/dL (ref 65–99)
Potassium: 3.2 mmol/L — ABNORMAL LOW (ref 3.5–5.3)
Sodium: 137 mmol/L (ref 135–146)
Total Protein: 6.6 g/dL (ref 6.1–8.1)

## 2015-02-25 LAB — LIPID PANEL
Cholesterol: 149 mg/dL (ref 125–200)
HDL: 56 mg/dL (ref >=40)
LDL Cholesterol: 66 mg/dL (ref <130)
TRIGLYCERIDES: 134 mg/dL (ref <150)
Total CHOL/HDL Ratio: 2.7 Ratio (ref <=5.0)
VLDL: 27 mg/dL (ref <30)

## 2015-02-25 LAB — T-HELPER CELL (CD4) - (RCID CLINIC ONLY)
CD4 T CELL HELPER: 17 % — AB (ref 33–55)
CD4 T Cell Abs: 250 /uL — ABNORMAL LOW (ref 400–2700)

## 2015-02-25 LAB — RPR

## 2015-02-25 LAB — HIV-1 RNA QUANT-NO REFLEX-BLD
HIV 1 RNA Quant: 97 copies/mL — ABNORMAL HIGH (ref <20)
HIV-1 RNA Quant, Log: 1.99 Log copies/mL — ABNORMAL HIGH (ref <1.30)

## 2015-02-25 NOTE — Telephone Encounter (Signed)
Per Dr [name] patient needs   KCL 40meq  X 1 and repeat BMP in two days. ( see lab dated 2-  17 Patient states he thinks he has medication at home and would like to call back in the morning since he is at work.  Advised I should call medication to pharmacy today and he refused. Wants to check at home first.   Laverle Patter, RN

## 2015-02-26 ENCOUNTER — Other Ambulatory Visit: Payer: Self-pay | Admitting: *Deleted

## 2015-02-26 MED ORDER — POTASSIUM CHLORIDE CRYS ER 20 MEQ PO TBCR: 40.0000 meq | EXTENDED_RELEASE_TABLET | Freq: Once | ORAL | Status: DC

## 2015-02-26 NOTE — Telephone Encounter (Signed)
Patient called back, his Rx was expired and I sent one to [facility]. He will try to come in Monday for repeat labs and he was added to the schedule. Myrtis Hopping

## 2015-03-01 ENCOUNTER — Other Ambulatory Visit: Payer: BLUE CROSS/BLUE SHIELD

## 2015-03-01 DIAGNOSIS — E876 Hypokalemia: Secondary | ICD-10-CM

## 2015-03-01 LAB — BASIC METABOLIC PANEL
BUN: 11 mg/dL (ref 7–25)
CO2: 25 mmol/L (ref 20–31)
Calcium: 8.9 mg/dL (ref 8.6–10.3)
Chloride: 106 mmol/L (ref 98–110)
Creat: 1.31 mg/dL (ref 0.70–1.33)
Glucose, Bld: 67 mg/dL (ref 65–99)
POTASSIUM: 3.5 mmol/L (ref 3.5–5.3)
Sodium: 142 mmol/L (ref 135–146)

## 2015-03-10 ENCOUNTER — Other Ambulatory Visit: Payer: Self-pay | Admitting: *Deleted

## 2015-03-10 ENCOUNTER — Telehealth: Payer: Self-pay | Admitting: Nurse Practitioner

## 2015-03-10 ENCOUNTER — Ambulatory Visit (INDEPENDENT_AMBULATORY_CARE_PROVIDER_SITE_OTHER): Payer: BLUE CROSS/BLUE SHIELD | Admitting: Infectious Diseases

## 2015-03-10 ENCOUNTER — Encounter: Payer: Self-pay | Admitting: Infectious Diseases

## 2015-03-10 VITALS — BP 93/60 | HR 83 | Temp 97.9°F | Ht 74.0 in | Wt 140.5 lb

## 2015-03-10 DIAGNOSIS — B20 Human immunodeficiency virus [HIV] disease: Secondary | ICD-10-CM | POA: Diagnosis not present

## 2015-03-10 DIAGNOSIS — C210 Malignant neoplasm of anus, unspecified: Secondary | ICD-10-CM | POA: Diagnosis not present

## 2015-03-10 DIAGNOSIS — E43 Unspecified severe protein-calorie malnutrition: Secondary | ICD-10-CM | POA: Diagnosis not present

## 2015-03-10 DIAGNOSIS — M00051 Staphylococcal arthritis, right hip: Secondary | ICD-10-CM

## 2015-03-10 NOTE — Assessment & Plan Note (Signed)
Encouraged him to continue ensure

## 2015-03-10 NOTE — Assessment & Plan Note (Signed)
I am concerned that this has recurred.  He will have ortho eval tomorrow.  Hopefully will get aspirated tomorrow.  Appreciate Dr Aurea Graff f/u.  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

## 2015-03-10 NOTE — Assessment & Plan Note (Signed)
He will call [facility] for f/u appt.  My great appreciation to them for f/u

## 2015-03-10 NOTE — Progress Notes (Signed)
   Subjective:    Patient ID: [name] [name], male    DOB: 09-Dec-1960, 55 y.o.   MRN: [id number]  HPI 55 yo M with HIV/AIDS, and anal CA (2008). Has been doing well. No problems with meds- Prezcobix/RPV/DTGV qday. He has previously been on: ATVr/TFV/CBV, EFV, KLT.  Has been ETOH and tobacco free for > 1 year.  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 He was septic, requiring dopamine initially. He underwent resection of his R hip and debridement of his decubitus ulcer. His Cx grew group B Strep. He was d/c to [facility] to complete 6 weeks of ceftriaxone.  He underwent R THR on 06-15-14. Was to stay on doxy til 01-2015.  TURP 01-2014. Has uro f/u at end of this month.  Had graduated to using a cain until he developed swollen spot on his L hip after sleeping on it. He is back to using a walker. Has ortho f/u in AM.  Needs f/u appt for Dr Ammie Dalton for anal ca f/u.   HIV 1 RNA QUANT (copies/mL)  Date Value  02/24/2015 97*  09/10/2014 97*  06/10/2014 1762*   CD4 T CELL ABS (/uL)  Date Value  02/24/2015 250*  09/10/2014 240*  06/10/2014 360*     Review of Systems  Constitutional: Positive for unexpected weight change. Negative for appetite change.  Gastrointestinal: Negative for diarrhea and constipation.  Genitourinary: Negative for difficulty urinating.   Is eating well, has lost 6#. Taking alka-seltzer colds, has had URI.     Objective:   Physical Exam  Constitutional: He appears well-developed and well-nourished.  HENT:  Mouth/Throat: No oropharyngeal exudate.  Eyes: EOM are normal. Pupils are equal, round, and reactive to light.  Neck: Neck supple.  Cardiovascular: Normal rate, regular rhythm and normal heart sounds.   Pulmonary/Chest: Effort normal and breath sounds normal.  Abdominal: Soft. Bowel sounds are normal. There is no tenderness. There is no rebound.  Musculoskeletal:       Legs: Lymphadenopathy:    He has no cervical  adenopathy.      Assessment & Plan:

## 2015-03-10 NOTE — Assessment & Plan Note (Addendum)
He is doing fairly welll. although still detectable He is being adherent as far as I can tell Will see him back in 3 months for f/u

## 2015-03-10 NOTE — Telephone Encounter (Signed)
per pof to sch pt appt-cld pt and left message of time & date of message

## 2015-03-15 ENCOUNTER — Inpatient Hospital Stay (HOSPITAL_COMMUNITY)
Admission: AD | Admit: 2015-03-15 | Discharge: 2015-03-18 | DRG: 466 | Disposition: A | Discharge status: Home Health | Payer: BLUE CROSS/BLUE SHIELD | Source: Ambulatory Visit | Attending: Orthopedic Surgery | Admitting: Orthopedic Surgery

## 2015-03-15 ENCOUNTER — Encounter (HOSPITAL_COMMUNITY): Payer: Self-pay | Admitting: *Deleted

## 2015-03-15 ENCOUNTER — Encounter (HOSPITAL_COMMUNITY)
Admission: AD | Disposition: A | Discharge status: Home Health | Payer: Self-pay | Source: Ambulatory Visit | Attending: Orthopedic Surgery

## 2015-03-15 ENCOUNTER — Inpatient Hospital Stay (HOSPITAL_COMMUNITY): Payer: BLUE CROSS/BLUE SHIELD | Admitting: Certified Registered Nurse Anesthetist

## 2015-03-15 DIAGNOSIS — Z205 Contact with and (suspected) exposure to viral hepatitis: Secondary | ICD-10-CM | POA: Diagnosis present

## 2015-03-15 DIAGNOSIS — M25552 Pain in left hip: Secondary | ICD-10-CM | POA: Diagnosis present

## 2015-03-15 DIAGNOSIS — Z833 Family history of diabetes mellitus: Secondary | ICD-10-CM

## 2015-03-15 DIAGNOSIS — Z888 Allergy status to other drugs, medicaments and biological substances status: Secondary | ICD-10-CM

## 2015-03-15 DIAGNOSIS — Z809 Family history of malignant neoplasm, unspecified: Secondary | ICD-10-CM

## 2015-03-15 DIAGNOSIS — Z8249 Family history of ischemic heart disease and other diseases of the circulatory system: Secondary | ICD-10-CM

## 2015-03-15 DIAGNOSIS — Z825 Family history of asthma and other chronic lower respiratory diseases: Secondary | ICD-10-CM

## 2015-03-15 DIAGNOSIS — M1990 Unspecified osteoarthritis, unspecified site: Secondary | ICD-10-CM | POA: Diagnosis present

## 2015-03-15 DIAGNOSIS — Z7951 Long term (current) use of inhaled steroids: Secondary | ICD-10-CM

## 2015-03-15 DIAGNOSIS — Z7952 Long term (current) use of systemic steroids: Secondary | ICD-10-CM

## 2015-03-15 DIAGNOSIS — Z79899 Other long term (current) drug therapy: Secondary | ICD-10-CM

## 2015-03-15 DIAGNOSIS — M009 Pyogenic arthritis, unspecified: Secondary | ICD-10-CM | POA: Diagnosis present

## 2015-03-15 DIAGNOSIS — Y831 Surgical operation with implant of artificial internal device as the cause of abnormal reaction of the patient, or of later complication, without mention of misadventure at the time of the procedure: Secondary | ICD-10-CM | POA: Diagnosis present

## 2015-03-15 DIAGNOSIS — T8451XA Infection and inflammatory reaction due to internal right hip prosthesis, initial encounter: Secondary | ICD-10-CM | POA: Diagnosis present

## 2015-03-15 DIAGNOSIS — Z87891 Personal history of nicotine dependence: Secondary | ICD-10-CM | POA: Diagnosis not present

## 2015-03-15 DIAGNOSIS — Z96641 Presence of right artificial hip joint: Secondary | ICD-10-CM | POA: Diagnosis present

## 2015-03-15 DIAGNOSIS — Z85048 Personal history of other malignant neoplasm of rectum, rectosigmoid junction, and anus: Secondary | ICD-10-CM | POA: Diagnosis not present

## 2015-03-15 DIAGNOSIS — M25551 Pain in right hip: Secondary | ICD-10-CM | POA: Diagnosis present

## 2015-03-15 DIAGNOSIS — M109 Gout, unspecified: Secondary | ICD-10-CM | POA: Diagnosis present

## 2015-03-15 DIAGNOSIS — B20 Human immunodeficiency virus [HIV] disease: Secondary | ICD-10-CM | POA: Diagnosis present

## 2015-03-15 DIAGNOSIS — T8451XD Infection and inflammatory reaction due to internal right hip prosthesis, subsequent encounter: Secondary | ICD-10-CM | POA: Diagnosis not present

## 2015-03-15 DIAGNOSIS — M00851 Arthritis due to other bacteria, right hip: Secondary | ICD-10-CM | POA: Diagnosis not present

## 2015-03-15 DIAGNOSIS — M25559 Pain in unspecified hip: Secondary | ICD-10-CM

## 2015-03-15 DIAGNOSIS — Y838 Other surgical procedures as the cause of abnormal reaction of the patient, or of later complication, without mention of misadventure at the time of the procedure: Secondary | ICD-10-CM | POA: Diagnosis not present

## 2015-03-15 DIAGNOSIS — B9689 Other specified bacterial agents as the cause of diseases classified elsewhere: Secondary | ICD-10-CM | POA: Diagnosis not present

## 2015-03-15 DIAGNOSIS — Z96649 Presence of unspecified artificial hip joint: Secondary | ICD-10-CM

## 2015-03-15 DIAGNOSIS — T8459XA Infection and inflammatory reaction due to other internal joint prosthesis, initial encounter: Secondary | ICD-10-CM

## 2015-03-15 HISTORY — PX: INCISION AND DRAINAGE HIP: SHX1801

## 2015-03-15 LAB — PROTIME-INR
INR: 1.26 (ref 0.00–1.49)
PROTHROMBIN TIME: 16 s — AB (ref 11.6–15.2)

## 2015-03-15 LAB — CBC
HCT: 29.7 % — ABNORMAL LOW (ref 39.0–52.0)
HEMOGLOBIN: 9.6 g/dL — AB (ref 13.0–17.0)
MCH: 30.1 pg (ref 26.0–34.0)
MCHC: 32.3 g/dL (ref 30.0–36.0)
MCV: 93.1 fL (ref 78.0–100.0)
PLATELETS: 338 10*3/uL (ref 150–400)
RBC: 3.19 MIL/uL — ABNORMAL LOW (ref 4.22–5.81)
RDW: 16 % — ABNORMAL HIGH (ref 11.5–15.5)
WBC: 11.4 10*3/uL — ABNORMAL HIGH (ref 4.0–10.5)

## 2015-03-15 LAB — BASIC METABOLIC PANEL
ANION GAP: 12 (ref 5–15)
BUN: 16 mg/dL (ref 6–20)
CALCIUM: 9 mg/dL (ref 8.9–10.3)
CO2: 21 mmol/L — ABNORMAL LOW (ref 22–32)
Chloride: 109 mmol/L (ref 101–111)
Creatinine, Ser: 1.14 mg/dL (ref 0.61–1.24)
GLUCOSE: 93 mg/dL (ref 65–99)
POTASSIUM: 3.7 mmol/L (ref 3.5–5.1)
SODIUM: 142 mmol/L (ref 135–145)

## 2015-03-15 LAB — TYPE AND SCREEN
ABO/RH(D): B POS
Antibody Screen: NEGATIVE

## 2015-03-15 LAB — APTT: APTT: 34 s (ref 24–37)

## 2015-03-15 SURGERY — IRRIGATION AND DEBRIDEMENT HIP WITH POLY EXCHANGE
Anesthesia: General | Site: Hip | Laterality: Right

## 2015-03-15 MED ORDER — DEXAMETHASONE SODIUM PHOSPHATE 10 MG/ML IJ SOLN
INTRAMUSCULAR | Status: DC | PRN
  Administered 2015-03-15: 10 mg via INTRAVENOUS

## 2015-03-15 MED ORDER — POLYETHYLENE GLYCOL 3350 17 G PO PACK
17.0000 g | PACK | Freq: Two times a day (BID) | ORAL | Status: DC
  Administered 2015-03-18: 17 g via ORAL

## 2015-03-15 MED ORDER — LACTATED RINGERS IV SOLN
INTRAVENOUS | Status: DC
  Administered 2015-03-15 (×2): via INTRAVENOUS

## 2015-03-15 MED ORDER — HYDROCODONE-ACETAMINOPHEN 7.5-325 MG PO TABS
1.0000 | ORAL_TABLET | ORAL | Status: DC
  Administered 2015-03-15 – 2015-03-16 (×2): 2 via ORAL
  Administered 2015-03-16 (×2): 1 via ORAL
  Administered 2015-03-16: 2 via ORAL
  Administered 2015-03-16 – 2015-03-18 (×8): 1 via ORAL
  Filled 2015-03-15 (×4): qty 1
  Filled 2015-03-15: qty 2
  Filled 2015-03-15 (×5): qty 1
  Filled 2015-03-15: qty 2
  Filled 2015-03-15 (×2): qty 1
  Filled 2015-03-15: qty 2

## 2015-03-15 MED ORDER — DOLUTEGRAVIR SODIUM 50 MG PO TABS
50.0000 mg | ORAL_TABLET | Freq: Every day | ORAL | Status: DC
  Administered 2015-03-16 – 2015-03-17 (×3): 50 mg via ORAL
  Filled 2015-03-15 (×4): qty 1

## 2015-03-15 MED ORDER — DEXTROSE 5 % IV SOLN
2.0000 g | INTRAVENOUS | Status: DC
  Administered 2015-03-15 – 2015-03-18 (×4): 2 g via INTRAVENOUS
  Filled 2015-03-15 (×4): qty 2

## 2015-03-15 MED ORDER — SUGAMMADEX SODIUM 200 MG/2ML IV SOLN
INTRAVENOUS | Status: AC
  Filled 2015-03-15: qty 2

## 2015-03-15 MED ORDER — METHOCARBAMOL 500 MG PO TABS
500.0000 mg | ORAL_TABLET | Freq: Four times a day (QID) | ORAL | Status: DC | PRN
  Administered 2015-03-16: 500 mg via ORAL
  Filled 2015-03-15: qty 1

## 2015-03-15 MED ORDER — FENTANYL CITRATE (PF) 100 MCG/2ML IJ SOLN
INTRAMUSCULAR | Status: DC | PRN
  Administered 2015-03-15 (×2): 50 ug via INTRAVENOUS
  Administered 2015-03-15: 100 ug via INTRAVENOUS

## 2015-03-15 MED ORDER — ACETAMINOPHEN 160 MG/5ML PO SOLN: 325.0000 mg | ORAL | Status: DC | PRN

## 2015-03-15 MED ORDER — ASPIRIN EC 325 MG PO TBEC
325.0000 mg | DELAYED_RELEASE_TABLET | Freq: Two times a day (BID) | ORAL | Status: DC
  Administered 2015-03-16 – 2015-03-18 (×5): 325 mg via ORAL
  Filled 2015-03-15 (×7): qty 1

## 2015-03-15 MED ORDER — HYDROGEN PEROXIDE 3 % EX SOLN
CUTANEOUS | Status: DC | PRN
  Administered 2015-03-15: 1

## 2015-03-15 MED ORDER — OXYCODONE HCL 5 MG PO TABS: 5.0000 mg | ORAL_TABLET | Freq: Once | ORAL | Status: DC | PRN

## 2015-03-15 MED ORDER — ONDANSETRON HCL 4 MG/2ML IJ SOLN
INTRAMUSCULAR | Status: AC
  Filled 2015-03-15: qty 2

## 2015-03-15 MED ORDER — SODIUM CHLORIDE 0.9 % IV SOLN
100.0000 mL/h | INTRAVENOUS | Status: DC
  Administered 2015-03-16: 100 mL/h via INTRAVENOUS
  Filled 2015-03-15 (×9): qty 1000

## 2015-03-15 MED ORDER — ENSURE ENLIVE PO LIQD: 237.0000 mL | Freq: Three times a day (TID) | ORAL | Status: DC | PRN

## 2015-03-15 MED ORDER — PHENYLEPHRINE 40 MCG/ML (10ML) SYRINGE FOR IV PUSH (FOR BLOOD PRESSURE SUPPORT)
PREFILLED_SYRINGE | INTRAVENOUS | Status: AC
  Filled 2015-03-15: qty 10

## 2015-03-15 MED ORDER — HYDROGEN PEROXIDE 3 % EX SOLN
CUTANEOUS | Status: AC
  Filled 2015-03-15: qty 473

## 2015-03-15 MED ORDER — DOCUSATE SODIUM 100 MG PO CAPS
100.0000 mg | ORAL_CAPSULE | Freq: Two times a day (BID) | ORAL | Status: DC
  Administered 2015-03-16 – 2015-03-18 (×5): 100 mg via ORAL

## 2015-03-15 MED ORDER — METOCLOPRAMIDE HCL 5 MG/ML IJ SOLN
5.0000 mg | Freq: Three times a day (TID) | INTRAMUSCULAR | Status: DC | PRN
  Administered 2015-03-17: 10 mg via INTRAVENOUS
  Filled 2015-03-15: qty 2

## 2015-03-15 MED ORDER — PHENOL 1.4 % MT LIQD: 1.0000 | OROMUCOSAL | Status: DC | PRN

## 2015-03-15 MED ORDER — DARUNAVIR-COBICISTAT 800-150 MG PO TABS
1.0000 | ORAL_TABLET | Freq: Every day | ORAL | Status: DC
  Administered 2015-03-15 – 2015-03-17 (×3): 1 via ORAL
  Filled 2015-03-15 (×4): qty 1

## 2015-03-15 MED ORDER — STERILE WATER FOR IRRIGATION IR SOLN
Status: DC | PRN
  Administered 2015-03-15 (×2): 1000 mL

## 2015-03-15 MED ORDER — PROPOFOL 10 MG/ML IV BOLUS
INTRAVENOUS | Status: AC
  Filled 2015-03-15: qty 20

## 2015-03-15 MED ORDER — OXYCHLOROSENE SODIUM POWD
1.0000 "application " | Freq: Once | Status: AC
  Administered 2015-03-15: 1

## 2015-03-15 MED ORDER — ROCURONIUM BROMIDE 100 MG/10ML IV SOLN
INTRAVENOUS | Status: AC
  Filled 2015-03-15: qty 1

## 2015-03-15 MED ORDER — BISACODYL 10 MG RE SUPP: 10.0000 mg | Freq: Every day | RECTAL | Status: DC | PRN

## 2015-03-15 MED ORDER — ONDANSETRON HCL 4 MG/2ML IJ SOLN
INTRAMUSCULAR | Status: AC
Start: 2015-03-15 — End: 2015-03-15
  Filled 2015-03-15: qty 2

## 2015-03-15 MED ORDER — DEXAMETHASONE SODIUM PHOSPHATE 10 MG/ML IJ SOLN
10.0000 mg | Freq: Once | INTRAMUSCULAR | Status: DC
  Filled 2015-03-15: qty 1

## 2015-03-15 MED ORDER — OXYCODONE HCL 5 MG/5ML PO SOLN
5.0000 mg | Freq: Once | ORAL | Status: DC | PRN
  Filled 2015-03-15: qty 5

## 2015-03-15 MED ORDER — SUCCINYLCHOLINE CHLORIDE 20 MG/ML IJ SOLN
INTRAMUSCULAR | Status: DC | PRN
  Administered 2015-03-15: 100 mg via INTRAVENOUS

## 2015-03-15 MED ORDER — ONDANSETRON HCL 4 MG/2ML IJ SOLN
4.0000 mg | Freq: Four times a day (QID) | INTRAMUSCULAR | Status: DC | PRN
  Administered 2015-03-16: 4 mg via INTRAVENOUS
  Filled 2015-03-15: qty 2

## 2015-03-15 MED ORDER — MAGNESIUM CITRATE PO SOLN: 1.0000 | Freq: Once | ORAL | Status: DC | PRN

## 2015-03-15 MED ORDER — CEFAZOLIN SODIUM-DEXTROSE 2-3 GM-% IV SOLR
INTRAVENOUS | Status: AC
  Filled 2015-03-15: qty 50

## 2015-03-15 MED ORDER — FENTANYL CITRATE (PF) 250 MCG/5ML IJ SOLN
INTRAMUSCULAR | Status: AC
  Filled 2015-03-15: qty 5

## 2015-03-15 MED ORDER — ONDANSETRON HCL 4 MG/2ML IJ SOLN
INTRAMUSCULAR | Status: DC | PRN
  Administered 2015-03-15: 4 mg via INTRAVENOUS

## 2015-03-15 MED ORDER — METHOCARBAMOL 1000 MG/10ML IJ SOLN
500.0000 mg | Freq: Four times a day (QID) | INTRAMUSCULAR | Status: DC | PRN
  Administered 2015-03-15: 500 mg via INTRAVENOUS
  Filled 2015-03-15 (×2): qty 5

## 2015-03-15 MED ORDER — ROCURONIUM BROMIDE 100 MG/10ML IV SOLN
INTRAVENOUS | Status: DC | PRN
  Administered 2015-03-15: 10 mg via INTRAVENOUS
  Administered 2015-03-15: 40 mg via INTRAVENOUS

## 2015-03-15 MED ORDER — SODIUM CHLORIDE 0.9% FLUSH
10.0000 mL | INTRAVENOUS | Status: DC | PRN
  Administered 2015-03-16 (×2): 10 mL
  Filled 2015-03-15 (×2): qty 40

## 2015-03-15 MED ORDER — FERROUS SULFATE 325 (65 FE) MG PO TABS
325.0000 mg | ORAL_TABLET | Freq: Three times a day (TID) | ORAL | Status: DC
  Administered 2015-03-16 – 2015-03-18 (×6): 325 mg via ORAL
  Filled 2015-03-15 (×10): qty 1

## 2015-03-15 MED ORDER — VANCOMYCIN HCL IN DEXTROSE 1-5 GM/200ML-% IV SOLN
1000.0000 mg | INTRAVENOUS | Status: AC
  Administered 2015-03-15: 1000 mg via INTRAVENOUS
  Filled 2015-03-15: qty 200

## 2015-03-15 MED ORDER — METOCLOPRAMIDE HCL 10 MG PO TABS
5.0000 mg | ORAL_TABLET | Freq: Three times a day (TID) | ORAL | Status: DC | PRN
  Administered 2015-03-16: 10 mg via ORAL
  Filled 2015-03-15: qty 1

## 2015-03-15 MED ORDER — RILPIVIRINE HCL 25 MG PO TABS
25.0000 mg | ORAL_TABLET | Freq: Every day | ORAL | Status: DC
  Administered 2015-03-15 – 2015-03-17 (×3): 25 mg via ORAL
  Filled 2015-03-15 (×4): qty 1

## 2015-03-15 MED ORDER — HYDROMORPHONE HCL 1 MG/ML IJ SOLN
0.2500 mg | INTRAMUSCULAR | Status: DC | PRN
  Administered 2015-03-15 (×2): 0.25 mg via INTRAVENOUS

## 2015-03-15 MED ORDER — SODIUM CHLORIDE 0.9 % IR SOLN
Status: DC | PRN
  Administered 2015-03-15: 3000 mL

## 2015-03-15 MED ORDER — ONDANSETRON HCL 4 MG PO TABS: 4.0000 mg | ORAL_TABLET | Freq: Four times a day (QID) | ORAL | Status: DC | PRN

## 2015-03-15 MED ORDER — ACETAMINOPHEN 325 MG PO TABS: 325.0000 mg | ORAL_TABLET | ORAL | Status: DC | PRN

## 2015-03-15 MED ORDER — 0.9 % SODIUM CHLORIDE (POUR BTL) OPTIME
TOPICAL | Status: DC | PRN
  Administered 2015-03-15: 1000 mL

## 2015-03-15 MED ORDER — CEFAZOLIN SODIUM-DEXTROSE 2-3 GM-% IV SOLR
2.0000 g | Freq: Once | INTRAVENOUS | Status: AC
  Administered 2015-03-15: 2 g via INTRAVENOUS

## 2015-03-15 MED ORDER — LIDOCAINE HCL (CARDIAC) 20 MG/ML IV SOLN
INTRAVENOUS | Status: DC | PRN
  Administered 2015-03-15: 10 mg via INTRAVENOUS

## 2015-03-15 MED ORDER — SUGAMMADEX SODIUM 200 MG/2ML IV SOLN
INTRAVENOUS | Status: DC | PRN
  Administered 2015-03-15: 150 mg via INTRAVENOUS

## 2015-03-15 MED ORDER — POVIDONE-IODINE 10 % EX SOLN
CUTANEOUS | Status: DC | PRN
  Administered 2015-03-15: 1 via TOPICAL

## 2015-03-15 MED ORDER — ALUM & MAG HYDROXIDE-SIMETH 200-200-20 MG/5ML PO SUSP: 30.0000 mL | ORAL | Status: DC | PRN

## 2015-03-15 MED ORDER — MENTHOL 3 MG MT LOZG: 1.0000 | LOZENGE | OROMUCOSAL | Status: DC | PRN

## 2015-03-15 MED ORDER — OXYCHLOROSENE SODIUM POWD
Status: AC
  Filled 2015-03-15: qty 2

## 2015-03-15 MED ORDER — HYDROMORPHONE HCL 1 MG/ML IJ SOLN: 0.5000 mg | INTRAMUSCULAR | Status: DC | PRN

## 2015-03-15 MED ORDER — PROMETHAZINE HCL 25 MG/ML IJ SOLN
6.2500 mg | INTRAMUSCULAR | Status: DC | PRN
  Administered 2015-03-15: 6.25 mg via INTRAVENOUS

## 2015-03-15 MED ORDER — DEXAMETHASONE SODIUM PHOSPHATE 10 MG/ML IJ SOLN
INTRAMUSCULAR | Status: AC
  Filled 2015-03-15: qty 1

## 2015-03-15 MED ORDER — LIDOCAINE HCL (CARDIAC) 20 MG/ML IV SOLN
INTRAVENOUS | Status: AC
  Filled 2015-03-15: qty 5

## 2015-03-15 MED ORDER — MIDAZOLAM HCL 5 MG/5ML IJ SOLN
INTRAMUSCULAR | Status: DC | PRN
  Administered 2015-03-15: 2 mg via INTRAVENOUS

## 2015-03-15 MED ORDER — DIPHENHYDRAMINE HCL 25 MG PO CAPS: 25.0000 mg | ORAL_CAPSULE | Freq: Four times a day (QID) | ORAL | Status: DC | PRN

## 2015-03-15 MED ORDER — PHENYLEPHRINE HCL 10 MG/ML IJ SOLN
INTRAMUSCULAR | Status: DC | PRN
  Administered 2015-03-15: 80 ug via INTRAVENOUS

## 2015-03-15 MED ORDER — CELECOXIB 200 MG PO CAPS
200.0000 mg | ORAL_CAPSULE | Freq: Two times a day (BID) | ORAL | Status: DC
  Administered 2015-03-15 – 2015-03-17 (×4): 200 mg via ORAL
  Filled 2015-03-15 (×6): qty 1

## 2015-03-15 MED ORDER — MIDAZOLAM HCL 2 MG/2ML IJ SOLN
INTRAMUSCULAR | Status: AC
  Filled 2015-03-15: qty 2

## 2015-03-15 MED ORDER — PROMETHAZINE HCL 25 MG/ML IJ SOLN
INTRAMUSCULAR | Status: AC
  Filled 2015-03-15: qty 1

## 2015-03-15 MED ORDER — PROPOFOL 10 MG/ML IV BOLUS
INTRAVENOUS | Status: DC | PRN
  Administered 2015-03-15: 150 mg via INTRAVENOUS

## 2015-03-15 MED ORDER — HYDROMORPHONE HCL 1 MG/ML IJ SOLN
INTRAMUSCULAR | Status: AC
  Filled 2015-03-15: qty 1

## 2015-03-15 SURGICAL SUPPLY — 61 items
BAG SPEC THK2 15X12 ZIP CLS (MISCELLANEOUS) ×1
BAG ZIPLOCK 12X15 (MISCELLANEOUS) ×3 IMPLANT
CLOSURE WOUND 1/2 X4 (GAUZE/BANDAGES/DRESSINGS) ×4
DRAPE INCISE IOBAN 85X60 (DRAPES) ×3 IMPLANT
DRAPE ORTHO SPLIT 77X108 STRL (DRAPES) ×6
DRAPE POUCH INSTRU U-SHP 10X18 (DRAPES) ×3 IMPLANT
DRAPE SURG 17X11 SM STRL (DRAPES) ×3 IMPLANT
DRAPE SURG ORHT 6 SPLT 77X108 (DRAPES) ×2 IMPLANT
DRAPE U-SHAPE 47X51 STRL (DRAPES) ×3 IMPLANT
DRAPE WARM FLUID 44X44 (DRAPE) ×1 IMPLANT
DRESSING ALLEVYN LIFE SACRUM (GAUZE/BANDAGES/DRESSINGS) ×2 IMPLANT
DRSG AQUACEL AG ADV 3.5X10 (GAUZE/BANDAGES/DRESSINGS) ×2 IMPLANT
DRSG EMULSION OIL 3X16 NADH (GAUZE/BANDAGES/DRESSINGS) IMPLANT
DRSG PAD ABDOMINAL 8X10 ST (GAUZE/BANDAGES/DRESSINGS) IMPLANT
DURAPREP 26ML APPLICATOR (WOUND CARE) ×3 IMPLANT
ELECT BLADE TIP CTD 4 INCH (ELECTRODE) ×3 IMPLANT
ELECT REM PT RETURN 9FT ADLT (ELECTROSURGICAL) ×3
ELECTRODE REM PT RTRN 9FT ADLT (ELECTROSURGICAL) ×1 IMPLANT
EVACUATOR 1/8 PVC DRAIN (DRAIN) ×1 IMPLANT
FACESHIELD WRAPAROUND (MASK) ×12 IMPLANT
FACESHIELD WRAPAROUND OR TEAM (MASK) ×4 IMPLANT
FLOSEAL 10ML (HEMOSTASIS) ×1 IMPLANT
GAUZE SPONGE 4X4 12PLY STRL (GAUZE/BANDAGES/DRESSINGS) IMPLANT
GLOVE BIOGEL M 7.0 STRL (GLOVE) IMPLANT
GLOVE BIOGEL PI IND STRL 7.5 (GLOVE) ×1 IMPLANT
GLOVE BIOGEL PI IND STRL 8.5 (GLOVE) ×1 IMPLANT
GLOVE BIOGEL PI INDICATOR 7.5 (GLOVE) ×2
GLOVE BIOGEL PI INDICATOR 8.5 (GLOVE) ×2
GLOVE ECLIPSE 8.0 STRL XLNG CF (GLOVE) ×4 IMPLANT
GLOVE ORTHO TXT STRL SZ7.5 (GLOVE) ×7 IMPLANT
GLOVE SURG ORTHO 8.0 STRL STRW (GLOVE) ×1 IMPLANT
GOWN STRL REUS W/TWL LRG LVL3 (GOWN DISPOSABLE) ×3 IMPLANT
GOWN STRL REUS W/TWL XL LVL3 (GOWN DISPOSABLE) ×4 IMPLANT
HEAD CERAMIC 36 PLUS5 (Hips) ×2 IMPLANT
IMMOBILIZER KNEE 20 (SOFTGOODS)
IMMOBILIZER KNEE 20 THIGH 36 (SOFTGOODS) IMPLANT
KIT BASIN OR (CUSTOM PROCEDURE TRAY) ×3 IMPLANT
LINER NEUTRAL 54X36MM PLUS 4 (Hips) ×2 IMPLANT
LIQUID BAND (GAUZE/BANDAGES/DRESSINGS) ×2 IMPLANT
MANIFOLD NEPTUNE II (INSTRUMENTS) ×3 IMPLANT
NS IRRIG 1000ML POUR BTL (IV SOLUTION) ×4 IMPLANT
PACK TOTAL JOINT (CUSTOM PROCEDURE TRAY) ×3 IMPLANT
POSITIONER SURGICAL ARM (MISCELLANEOUS) ×3 IMPLANT
SPONGE LAP 18X18 X RAY DECT (DISPOSABLE) ×3 IMPLANT
SPONGE LAP 4X18 X RAY DECT (DISPOSABLE) ×1 IMPLANT
STRIP CLOSURE SKIN 1/2X4 (GAUZE/BANDAGES/DRESSINGS) ×8 IMPLANT
SUCTION FRAZIER HANDLE 10FR (MISCELLANEOUS) ×2
SUCTION TUBE FRAZIER 10FR DISP (MISCELLANEOUS) ×1 IMPLANT
SUT MNCRL AB 4-0 PS2 18 (SUTURE) ×3 IMPLANT
SUT MON AB 3-0 SH 27 (SUTURE) ×3
SUT MON AB 3-0 SH27 (SUTURE) IMPLANT
SUT PDS AB 1 CT1 27 (SUTURE) ×6 IMPLANT
SUT VIC AB 1 CT1 36 (SUTURE) ×6 IMPLANT
SUT VIC AB 2-0 CT1 27 (SUTURE) ×6
SUT VIC AB 2-0 CT1 TAPERPNT 27 (SUTURE) ×2 IMPLANT
SWAB COLLECTION DEVICE MRSA (MISCELLANEOUS) ×1 IMPLANT
SWAB CULTURE ESWAB REG 1ML (MISCELLANEOUS) ×1 IMPLANT
TOWEL OR 17X26 10 PK STRL BLUE (TOWEL DISPOSABLE) ×6 IMPLANT
TRAY FOLEY W/METER SILVER 14FR (SET/KITS/TRAYS/PACK) ×1 IMPLANT
TRAY FOLEY W/METER SILVER 16FR (SET/KITS/TRAYS/PACK) ×3 IMPLANT
WATER STERILE IRR 1500ML POUR (IV SOLUTION) ×3 IMPLANT

## 2015-03-15 NOTE — H&P (Signed)
  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

## 2015-03-15 NOTE — Progress Notes (Addendum)
Pharmacy Antibiotic Note  [name] [name] is a 55 y.o. 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  Pharmacy has been consulted for ceftriaxone dosing.  Note pt received Ancef and Vanc x 1 pre-op.   Plan: Ceftriaxone 2g IV q24h  Dosage remains stable and need for further dosage adjustment appears unlikely at present.    Will sign off at this time.  Please reconsult if a change in clinical status warrants re-evaluation of dosage.   Height: 6\' 2"  (188 cm) Weight: 138 lb 8 oz (62.823 kg) IBW/kg (Calculated) : 82.2  Temp (24hrs), Avg:98.3 F (36.8 C), Min:97.6 F (36.4 C), Max:99.1 F (37.3 C)   Recent Labs Lab 03/15/15 1510  WBC 11.4*  CREATININE 1.14    Estimated Creatinine Clearance: 66.6 mL/min (by C-G formula based on Cr of 1.14).    Allergies  Allergen Reactions  . Collagen Rash    redness    Antimicrobials this admission: 3/6 Ancef x1 3/6 Vancomycin x1  3/6 Ceftriaxone >>   Dose adjustments this admission:   Microbiology results:  Thank you for allowing pharmacy to be a part of this patient's care.  Ralene Bathe, PharmD, BCPS 03/15/2015, 9:32 PM  Pager: [phone]

## 2015-03-15 NOTE — Anesthesia Procedure Notes (Signed)
Procedure Name: Intubation Date/Time: 03/15/2015 5:41 PM Performed by: Lind Covert Pre-anesthesia Checklist: Patient identified, Timeout performed, Emergency Drugs available, Suction available and Patient being monitored Patient Re-evaluated:Patient Re-evaluated prior to inductionOxygen Delivery Method: Circle system utilized Preoxygenation: Pre-oxygenation with 100% oxygen Intubation Type: IV induction Laryngoscope Size: Mac and 4 Grade View: Grade I Tube type: Oral Tube size: 7.5 mm Number of attempts: 1 Airway Equipment and Method: Stylet Placement Confirmation: ETT inserted through vocal cords under direct vision,  breath sounds checked- equal and bilateral and positive ETCO2 Secured at: 22 cm Tube secured with: Tape Dental Injury: Teeth and Oropharynx as per pre-operative assessment

## 2015-03-15 NOTE — Transfer of Care (Signed)
Immediate Anesthesia Transfer of Care Note  Patient: [name] [name]  Procedure(s) Performed: Procedure(s): IRRIGATION AND DEBRIDEMENT RIGHT HIP WITH  HEAD BALL  AND POLY LINER EXCHANGE (Right)  Patient Location: PACU  Anesthesia Type:General  Level of Consciousness: awake, alert  and oriented  Airway & Oxygen Therapy: Patient Spontanous Breathing and Patient connected to face mask oxygen  Post-op Assessment: Report given to RN and Post -op Vital signs reviewed and stable  Post vital signs: Reviewed and stable  Last Vitals:  Filed Vitals:   03/15/15 1415  BP: 90/56  Pulse: 72  Temp: 36.9 C  Resp: 16    Complications: No apparent anesthesia complications

## 2015-03-15 NOTE — Op Note (Signed)
NAMEMARSON, SALO                ACCOUNT NO.:  [id number]  MEDICAL RECORD NO.:  [id number]  LOCATION:  [id number]                         FACILITY:  [facility]  PHYSICIAN:  Pietro Cassis. [name], M.D.  DATE OF BIRTH:  02-02-1960  DATE OF PROCEDURE:  03/15/2015 DATE OF DISCHARGE:                              OPERATIVE REPORT   PREOPERATIVE DIAGNOSIS:  Acute hematogenous infection, right hip with a preadmission aspiration showing Gram cocci in pairs or change.  POSTOPERATIVE DIAGNOSIS:  Acute hematogenous infection, right hip with a preadmission aspiration showing Gram cocci in pairs or change.  PROCEDURE:  Open excision and nonexcisional debridement of right hip, excising a 10-inch incision of skin, subcutaneous tissue, nonviable tissue, then irrigated with 6 liters of normal saline solution with pulse lavage as well as bathing the hip in a combination of 2 g of Clorpactin with normal saline followed by a Betadine and peroxide combination.  In addition, we did a revision of the acetabular liner and head ball exchange in out for a 52 x 36+ 4 neutral AltrX liner and a 36+ 5 ceramic ball, which was what we took out.  SURGEON:  Pietro Cassis. [name], M.D.  ASSISTANT:  Danae Orleans, PA-C.  Note that Mr. Guinevere Scarlet was present for the entirety of the case from preoperative position, perioperative management of the operative extremity, general facilitation of the case and primary wound closure.  ANESTHESIA:  General.  SPECIMENS:  None taken based on the preoperative aspiration in our office through [facility].  DRAINS:  None.  BLOOD LOSS:  About 200 mL.  COMPLICATION:  None.  INDICATIONS FOR PROCEDURE:  Mr. Brettschneider is a 55 year old male with multiple medical comorbidities including HIV and malnutrition.  He has been a patient of mine since he was discovered to have a septic right hip joint and native right hip joint infection about a year and a half, and it was complicated by sacral decubitus.  He  had undergone resection of his infected hip, treatment with the Prostalac antibiotic cement until the sacral wound heal.  Once it had healed, we reimplanted his right hip and had been on suppressive doxycycline.  He subsequently presented in the office this past week with a recent 1- week history of increasing pain in the right hip with some swelling laterally.  It was aspirated in the office and sent to the [facility] for evaluation indicating acute infection.  He was just subsequently asked to be admitted to the hospital today for treatment in an acute setting to try to salvage his joint.  Risk, benefits, the indications for this type of procedure were discussed and reviewed with him.  Based on the fact, this most likely was a Streptococcus infection, I felt that we had a better than not chance of trying to cure this infection.  PROCEDURE IN DETAIL:  The patient was brought to the operative theater. Once adequate anesthesia, preoperative antibiotics, vancomycin and Ancef administered, he was positioned into the left lateral decubitus position with right side up.  The right lower extremity was then prepped and draped in sterile fashion.  Time-out was performed identifying the patient, planned procedure, and extremity.  His old incision was identified  and demarcated on the skin and soft tissue.  Following a time-out, his incision was excised.  The area of swelling was evacuated, showing the fluid from the joint appeared to be more of a Strep infection than a Staph infection on gross appearance.  At this point, I had performed a soft tissue dissection and debridement, excising nonviable-appearing tissue and pseudo-bursal type tissue.  Once this was carried out, the posterior aspect of the hip was exposed.  The hip was dislocated, the femoral head was removed followed by the acetabular liner.  At this point, I irrigated with 3 liters of normal saline solution.  Following this 3  liters, we did two separate bathings; one with the Clorpactin over a probable 5-10 minutes span followed by Betadine, peroxide and saline bath for another 5 minutes.  Once this was done, I irrigated the hip again with 3 liters of normal saline solution and reimplanted a 36+ 4 neutral AltrX liner and a 36+ 5 ball on a clean, dry trunnion.  The hip was reduced.  At this point, we reapproximated the iliotibial band and gluteal fascia with #1 PDS sutures.  The remainder of the wound was closed with 2-0 Vicryl and a running 3-0 Monocryl.  The hip was cleaned, dried and dressed sterilely with surgical glue and Aquacel dressing.  He was brought to the recovery room in stable condition.  Postoperatively, he will be weightbearing as tolerated.  We will consulted Infectious Disease for management as they know the patient followed in their clinic.  We will get a PICC line.  He will be on hopefully bacteriocidal antibiotic per Infectious Disease.  Findings were reviewed with his sister.     Pietro Cassis [name], M.D.     MDO/MEDQ  D:  03/15/2015  T:  03/15/2015  Job:  [id number]

## 2015-03-15 NOTE — Progress Notes (Signed)
Peripherally Inserted Central Catheter/Midline Placement  The IV Nurse has discussed with the patient and/or persons authorized to consent for the patient, the purpose of this procedure and the potential benefits and risks involved with this procedure.  The benefits include less needle sticks, lab draws from the catheter and patient may be discharged home with the catheter.  Risks include, but not limited to, infection, bleeding, blood clot (thrombus formation), and puncture of an artery; nerve damage and irregular heat beat.  Alternatives to this procedure were also discussed.  PICC/Midline Placement Documentation        Darlyn Read 03/15/2015, 10:22 PM

## 2015-03-15 NOTE — Brief Op Note (Signed)
03/15/2015  7:18 PM  PATIENT:  [name] [name]  55 y.o. 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   Delay start of Pharmacological VTE agent (>24hrs) due to surgical blood loss or risk of bleeding: no

## 2015-03-15 NOTE — Anesthesia Preprocedure Evaluation (Signed)
Anesthesia Evaluation  Patient identified by MRN, date of birth, ID band Patient awake    Reviewed: Allergy & Precautions, NPO status , Patient's Chart, lab work & pertinent test results  History of Anesthesia Complications Negative for: history of anesthetic complications  Airway Mallampati: II  TM Distance: >3 FB Neck ROM: Full    Dental  (+) Poor Dentition,    Pulmonary neg shortness of breath, neg sleep apnea, neg recent URI, former smoker,    Pulmonary exam normal breath sounds clear to auscultation       Cardiovascular (-) hypertension+ Peripheral Vascular Disease  Normal cardiovascular exam Rhythm:Regular Rate:Normal     Neuro/Psych negative neurological ROS  negative psych ROS   GI/Hepatic negative GI ROS, Neg liver ROS,   Endo/Other  negative endocrine ROS  Renal/GU Renal InsufficiencyRenal disease     Musculoskeletal  (+) Arthritis ,   Abdominal   Peds  Hematology  (+) anemia , HIV,   Anesthesia Other Findings   Reproductive/Obstetrics                             Anesthesia Physical Anesthesia Plan  ASA: III  Anesthesia Plan: General   Post-op Pain Management:    Induction: Intravenous  Airway Management Planned: Oral ETT  Additional Equipment: None  Intra-op Plan:   Post-operative Plan: Extubation in OR  Informed Consent: I have reviewed the patients History and Physical, chart, labs and discussed the procedure including the risks, benefits and alternatives for the proposed anesthesia with the patient or authorized representative who has indicated his/her understanding and acceptance.   Dental advisory given  Plan Discussed with: CRNA and Surgeon  Anesthesia Plan Comments:         Anesthesia Quick Evaluation

## 2015-03-16 ENCOUNTER — Encounter (HOSPITAL_COMMUNITY): Payer: Self-pay | Admitting: Orthopedic Surgery

## 2015-03-16 DIAGNOSIS — T8451XD Infection and inflammatory reaction due to internal right hip prosthesis, subsequent encounter: Secondary | ICD-10-CM

## 2015-03-16 DIAGNOSIS — B9689 Other specified bacterial agents as the cause of diseases classified elsewhere: Secondary | ICD-10-CM

## 2015-03-16 DIAGNOSIS — B20 Human immunodeficiency virus [HIV] disease: Secondary | ICD-10-CM

## 2015-03-16 DIAGNOSIS — Z85048 Personal history of other malignant neoplasm of rectum, rectosigmoid junction, and anus: Secondary | ICD-10-CM

## 2015-03-16 DIAGNOSIS — M00851 Arthritis due to other bacteria, right hip: Secondary | ICD-10-CM

## 2015-03-16 DIAGNOSIS — Y838 Other surgical procedures as the cause of abnormal reaction of the patient, or of later complication, without mention of misadventure at the time of the procedure: Secondary | ICD-10-CM

## 2015-03-16 LAB — CBC
HCT: 27.8 % — ABNORMAL LOW (ref 39.0–52.0)
Hemoglobin: 9 g/dL — ABNORMAL LOW (ref 13.0–17.0)
MCH: 31 pg (ref 26.0–34.0)
MCHC: 32.4 g/dL (ref 30.0–36.0)
MCV: 95.9 fL (ref 78.0–100.0)
PLATELETS: 318 10*3/uL (ref 150–400)
RBC: 2.9 MIL/uL — ABNORMAL LOW (ref 4.22–5.81)
RDW: 16.5 % — AB (ref 11.5–15.5)
WBC: 10.2 10*3/uL (ref 4.0–10.5)

## 2015-03-16 LAB — BASIC METABOLIC PANEL
Anion gap: 6 (ref 5–15)
BUN: 20 mg/dL (ref 6–20)
CO2: 25 mmol/L (ref 22–32)
CREATININE: 0.85 mg/dL (ref 0.61–1.24)
Calcium: 8.4 mg/dL — ABNORMAL LOW (ref 8.9–10.3)
Chloride: 108 mmol/L (ref 101–111)
GFR calc Af Amer: >60 mL/min (ref >60)
GLUCOSE: 171 mg/dL — AB (ref 65–99)
Potassium: 4.6 mmol/L (ref 3.5–5.1)
SODIUM: 139 mmol/L (ref 135–145)

## 2015-03-16 LAB — SEDIMENTATION RATE: SED RATE: 118 mm/h — AB (ref 0–16)

## 2015-03-16 MED ORDER — TAMSULOSIN HCL 0.4 MG PO CAPS
0.4000 mg | ORAL_CAPSULE | Freq: Every day | ORAL | Status: DC
  Administered 2015-03-16 – 2015-03-18 (×3): 0.4 mg via ORAL
  Filled 2015-03-16 (×3): qty 1

## 2015-03-16 NOTE — Evaluation (Signed)
Physical Therapy Evaluation Patient Details Name: [name] [name] MRN: [id number] DOB: 05-Apr-1960 Today's Date: 03/16/2015   History of Present Illness  s/p R hip I & D; head ball and poly liner exchange; PMHx: HIV+, anal CA  Clinical Impression  Pt admitted with above diagnosis. Pt currently with functional limitations due to the deficits listed below (see PT Problem List). * Pt will benefit from skilled PT to increase their independence and safety with mobility to allow discharge to the venue listed below.       Follow Up Recommendations Home health PT    Equipment Recommendations  None recommended by PT    Recommendations for Other Services       Precautions / Restrictions Precautions Precautions: Posterior Hip;Fall Restrictions Other Position/Activity Restrictions: WBAT      Mobility  Bed Mobility Overal bed mobility: Needs Assistance Bed Mobility: Supine to Sit     Supine to sit: Supervision     General bed mobility comments: oob  Transfers Overall transfer level: Needs assistance Equipment used: Rolling walker (2 wheeled) Transfers: Sit to/from Stand Sit to Stand: Min guard         General transfer comment:  Cues for hand placement and safety  Ambulation/Gait Ambulation/Gait assistance: Min guard Ambulation Distance (Feet): 80 Feet Assistive device: Rolling walker (2 wheeled) Gait Pattern/deviations: Step-through pattern;Step-to pattern;Decreased stride length     General Gait Details: cues for gait progression  Stairs            Wheelchair Mobility    Modified Rankin (Stroke Patients Only)       Balance                                             Pertinent Vitals/Pain Pain Assessment: 0-10 Pain Score: 4  Pain Location: R hip Pain Descriptors / Indicators: Sore Pain Intervention(s): Limited activity within patient's tolerance;Monitored during session;Premedicated before session;Repositioned    Home Living  Family/patient expects to be discharged to:: Private residence Living Arrangements: Other relatives Available Help at Discharge: Friend(s);Family;Available 24 hours/day Type of Home: House Home Access: Ramped entrance     Home Layout: One level Home Equipment: Toilet riser;Shower seat;Bedside commode;Walker - 2 wheels;Cane - single point Additional Comments: Pt states plans to dc home with sister     Prior Function Level of Independence: Independent with assistive device(s)         Comments: works shorter shifts, uses cane at times     Hand Dominance        Extremity/Trunk Assessment   Upper Extremity Assessment: Overall WFL for tasks assessed           Lower Extremity Assessment: RLE deficits/detail RLE Deficits / Details: ankle WFL,  knee 3/5       Communication   Communication: No difficulties  Cognition Arousal/Alertness: Awake/alert Behavior During Therapy: WFL for tasks assessed/performed Overall Cognitive Status: Within Functional Limits for tasks assessed                      General Comments      Exercises Total Joint Exercises Ankle Circles/Pumps: AROM;Both;10 reps Quad Sets: 10 reps;Both;Strengthening      Assessment/Plan    PT Assessment Patient needs continued PT services  PT Diagnosis Difficulty walking   PT Problem List Decreased strength;Decreased activity tolerance;Decreased mobility;Decreased knowledge of use of DME;Decreased knowledge of precautions  PT Treatment Interventions DME instruction;Gait training;Functional mobility training;Therapeutic activities;Therapeutic exercise;Patient/family education   PT Goals (Current goals can be found in the Care Plan section) Acute Rehab PT Goals Patient Stated Goal: get back to being independent PT Goal Formulation: With patient Time For Goal Achievement: 03/19/15 Potential to Achieve Goals: Good    Frequency 7X/week   Barriers to discharge        Co-evaluation                End of Session Equipment Utilized During Treatment: Gait belt Activity Tolerance: Patient tolerated treatment well Patient left: with call bell/phone within reach;in chair;with chair alarm set           Time: [id number] PT Time Calculation (min) (ACUTE ONLY): 36 min   Charges:   PT Evaluation $PT Eval Low Complexity: 1 Procedure PT Treatments $Gait Training: 8-22 mins   PT G Codes:        Dekisha Mesmer 03/25/2015, 2:05 PM

## 2015-03-16 NOTE — Progress Notes (Signed)
Patient ID: [name] [name], male   DOB: [birth year], 55 y.o.   MRN: [id number] Subjective: 1 Day Post-Op Procedure(s) (LRB): IRRIGATION AND DEBRIDEMENT RIGHT HIP WITH  HEAD BALL  AND POLY LINER EXCHANGE (Right)    Patient reports pain as mild to moderate but better than pre-operative state.  Still complains of left hip pain as well  Objective:   VITALS:   Filed Vitals:   03/16/15 0940 03/16/15 1319  BP: 82/56 84/57  Pulse: 58 60  Temp:  98.1 F (36.7 C)  Resp:  20    Neurovascular intact Incision: dressing C/D/I  No drain used at time of OR  LABS  Recent Labs  03/15/15 1510 03/16/15 0556  HGB 9.6* 9.0*  HCT 29.7* 27.8*  WBC 11.4* 10.2  PLT 338 318     Recent Labs  03/15/15 1510 03/16/15 0556  NA 142 139  K 3.7 4.6  BUN 16 20  CREATININE 1.14 0.85  GLUCOSE 93 171*     Recent Labs  03/15/15 1510  INR 1.26     Assessment/Plan: 1 Day Post-Op Procedure(s) (LRB): IRRIGATION AND DEBRIDEMENT RIGHT HIP WITH  HEAD BALL  AND POLY LINER EXCHANGE (Right)   Advance diet Up with therapy Continue ABX therapy due to infected right total hip  PIC line ID consult Started on Rocephin based on office aspiration culture results ?MRI left hip to evaluate for fluid collection intra-versus extra articular

## 2015-03-16 NOTE — Progress Notes (Signed)
   03/16/15 1600  PT Visit Information  Last PT Received On 03/16/15  Assistance Needed +1  History of Present Illness s/p R hip I & D; head ball and poly liner exchange; PMHx: HIV+, anal CA  PT Time Calculation  PT Start Time (ACUTE ONLY) 1459  PT Stop Time (ACUTE ONLY) 1526  PT Time Calculation (min) (ACUTE ONLY) 27 min  Subjective Data  Patient Stated Goal get back to being independent  Precautions  Precautions Posterior Hip;Fall  Restrictions  Other Position/Activity Restrictions WBAT  Pain Assessment  Pain Assessment 0-10  Pain Score 3  Pain Location R hip  Pain Descriptors / Indicators Sore;Tightness  Pain Intervention(s) Limited activity within patient's tolerance;Monitored during session;Premedicated before session;Repositioned;Ice applied  Cognition  Arousal/Alertness Awake/alert  Behavior During Therapy WFL for tasks assessed/performed  Overall Cognitive Status Within Functional Limits for tasks assessed  Bed Mobility  Overal bed mobility Needs Assistance  Bed Mobility Supine to Sit;Sit to Supine  Supine to sit Supervision  Sit to supine Min assist  General bed mobility comments assist with RLE  Transfers  Overall transfer level Needs assistance  Equipment used Rolling walker (2 wheeled)  Transfers Sit to/from Stand  Sit to Stand Supervision;From elevated surface  General transfer comment Cues for hand placement and safety  Ambulation/Gait  Ambulation/Gait assistance Min guard  Ambulation Distance (Feet) 90 Feet  Assistive device Rolling walker (2 wheeled)  Gait Pattern/deviations Step-to pattern;Step-through pattern  General Gait Details cues for gait progression  Total Joint Exercises  Ankle Circles/Pumps AROM;Both;10 reps  Quad Sets 10 reps;Both;Strengthening  Heel Slides AAROM;Right;10 reps  Hip ABduction/ADduction AAROM;Right;10 reps  PT - End of Session  Equipment Utilized During Treatment Gait belt  Activity Tolerance Patient tolerated treatment well   Patient left in bed;with call bell/phone within reach;with bed alarm set;with family/visitor present  PT - Assessment/Plan  PT Plan Current plan remains appropriate  PT Frequency (ACUTE ONLY) 7X/week  Follow Up Recommendations Home health PT  PT equipment None recommended by PT  PT Goal Progression  Progress towards PT goals Progressing toward goals  Acute Rehab PT Goals  PT Goal Formulation With patient  Time For Goal Achievement 03/19/15  Potential to Achieve Goals Good  PT General Charges  $$ ACUTE PT VISIT 1 Procedure  PT Treatments  $Gait Training 8-22 mins  $Therapeutic Exercise 8-22 mins

## 2015-03-16 NOTE — Progress Notes (Signed)
[facility]  Patient Status:   New pt for [facility] this admission  [facility] is providing the following services: HHRN provided by [facility]. [facility] home infusion pharmacy will provide home IV ABX for pt. [facility] will follow along with [place] team to support transition home when ordered.  If patient discharges after hours, please call [phone].   Larry Sierras 03/16/2015, 10:48 PM

## 2015-03-16 NOTE — Consult Note (Signed)
[facility]  Date of Admission:  03/15/2015  Date of Consult:  03/16/2015  Reason for Consult: Septic Arthritis, HIV/AIDS Referring Physician: [name]  Impression/Recommendation Septic Arthritis Prosthetic Joint Continue ceftriaxone at 2g once daily Would like to add rifampin but will interact with his HIV rx Check ESR and CRP He has PIC.   HIV-AIDS Continue his deep salvage regimen.  Had labs recently, no need to repeat his CD4 and HIV RNA.   Anal CA (2008)  Thank you so much for this interesting consult, My great appreciation to Dr [name] and Guinevere Scarlet for their care of Mr [name] [name].    [name] (pager) [phone] [email or web address]  [name] [name] is an 55 y.o. male.  HPI: 55 yo M with HIV/AIDS, and rectal CA (2008). Has been doing well. No problems with meds- Prezcobix/RPV/DTGV qday. He has previously been on: ATVr/TFV/CBV, EFV, KLT.  Has been ETOH and tobacco free for > 1 year.  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 He was septic, requiring dopamine initially. He underwent resection of his R hip and debridement of his decubitus ulcer. His Cx grew group B Strep. He was d/c to [facility] to complete 6 weeks of ceftriaxone.  He underwent R THR on 06-15-14. Was placed on doxy post-op.  TURP 01-2014.  He was seen in ID clinic on 3-1 and complained of swelling over his L hip. He thought this was related to how he had slept on it. He was also noted to have fluctuance over his R hip. He was seen by ortho on 3-2 and had  R THR joint aspirate. He had molecular test (synovasure) done on fluid which revealed streptococci. He was adm to hospital on 3-6 and underwent I & D and poly-exchange. He was started on ceftriaxone post-op. Cx not sent.   HIV 1 RNA QUANT (copies/mL)  Date Value  02/24/2015 97*  09/10/2014 97*  06/10/2014 1762*   CD4 T CELL ABS (/uL)  Date Value  02/24/2015 250*  09/10/2014 240*    06/10/2014 360*       Past Medical History  Diagnosis Date  . Radiation   . HIV (human immunodeficiency virus infection) ([place])   . Shingles     left shoulder, right leg  . Pain in limb 07/23/2013  . 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 H/O hypotension   . Arthritis   . History of gout   . Hx of sepsis 2015    affected rt hip / femur  . Exposure to hepatitis B   . 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 History of transfusion   . Decubitus ulcer of sacral area     Past Surgical History  Procedure Laterality Date  . Hematoma evacuation    . Incision and drainage hip Right 07/31/2013    Procedure: IRRIGATION AND DEBRIDEMENT HIP WITH PERCUTANEOUS DRAIN PLACEMENT;  Surgeon: [name], MD;  Location: [place];  Service: Orthopedics;  Laterality: Right;  . Total hip arthroplasty Right 08/04/2013    Procedure: RESECTION HIP JOINT - PLACEMENT OF CEMENT PROSTHESIS;  Surgeon: [name], MD;  Location: [place];  Service: Orthopedics;  Laterality: Right;  . Pilonidal cyst drainage N/A 08/09/2013    Procedure: IRRIGATION AND DEBRIDEMENT SACRAL DECUBITUS;  Surgeon: Harl Bowie, MD;  Location: [place];  Service: General;  Laterality: N/A;  .  Incision and drainage of wound N/A 08/25/2013    Procedure: IRRIGATION AND DEBRIDEMENT OF SACRAL ULCER WITH PLACEMENT OF A CELL ;  Surgeon: Theodoro Kos, DO;  Location: [place];  Service: Plastics;  Laterality: N/A;  . Incision and drainage of wound N/A 09/01/2013    Procedure: IRRIGATION AND DEBRIDEMENT SACRAL ULCER WITH PLACEMENT OF A CELL;  Surgeon: Theodoro Kos, DO;  Location: [place];  Service: Plastics;  Laterality: N/A;  . Incision and drainage of wound N/A 09/08/2013    Procedure: IRRIGATION AND DEBRIDEMENT OF SACRAL ULCER WITH PLACEMENT OF A CELL AND VAC;  Surgeon: Theodoro Kos, DO;  Location: [place];  Service: Plastics;  Laterality: N/A;  . Incision and drainage of wound N/A 09/18/2013    Procedure: IRRIGATION AND  DEBRIDEMENT OF SACRAL WOUND WITH PLACEMENT OF A-CELL;  Surgeon: Theodoro Kos, DO;  Location: [facility];  Service: Plastics;  Laterality: N/A;  . Hernia repair      right X2  . Transurethral resection of prostate  JAN 2016  . Cystoscopy    . Conversion to total hip Right 06/15/2014    Procedure: REMOVAL OF ANTIBIOTIC  SPACER AND CONVERSION TO RIGHT TOTAL HIP  ARTHROPLASTY ;  Surgeon: Paralee Cancel, MD;  Location: [facility];  Service: Orthopedics;  Laterality: Right;  . Incision and drainage hip Right 03/15/2015    Procedure: IRRIGATION AND DEBRIDEMENT RIGHT HIP WITH  HEAD BALL  AND POLY LINER EXCHANGE;  Surgeon: Paralee Cancel, MD;  Location: [facility];  Service: Orthopedics;  Laterality: Right;     Allergies  Allergen Reactions  . Collagen Rash    redness    Medications:  Scheduled: . aspirin EC  325 mg Oral BID  . cefTRIAXone (ROCEPHIN)  IV  2 g Intravenous Q24H  . celecoxib  200 mg Oral Q12H  . darunavir-cobicistat  1 tablet Oral Q supper  . dexamethasone  10 mg Intravenous Once  . docusate sodium  100 mg Oral BID  . dolutegravir  50 mg Oral QHS  . ferrous sulfate  325 mg Oral TID PC  . HYDROcodone-acetaminophen  1-2 tablet Oral Q4H  . polyethylene glycol  17 g Oral BID  . rilpivirine  25 mg Oral Q supper    Abtx:  Anti-infectives    Start     Dose/Rate Route Frequency Ordered Stop   03/15/15 2200  darunavir-cobicistat (PREZCOBIX) 800-150 MG per tablet 1 tablet     1 tablet Oral Daily with supper 03/15/15 2107     03/15/15 2200  dolutegravir (TIVICAY) tablet 50 mg     50 mg Oral Daily at bedtime 03/15/15 2107     03/15/15 2200  rilpivirine (EDURANT) tablet 25 mg     25 mg Oral Daily with supper 03/15/15 2107     03/15/15 2200  cefTRIAXone (ROCEPHIN) 2 g in dextrose 5 % 50 mL IVPB     2 g 100 mL/hr over 30 Minutes Intravenous Every 24 hours 03/15/15 2132     03/15/15 1815  ceFAZolin (ANCEF) IVPB 2 g/50 mL premix     2 g 100 mL/hr over 30 Minutes Intravenous  Once 03/15/15 1812 03/15/15  1818   03/15/15 1600  vancomycin (VANCOCIN) IVPB 1000 mg/200 mL premix     1,000 mg 200 mL/hr over 60 Minutes Intravenous On call to O.R. 03/15/15 1529 03/15/15 1728      Total days of antibiotics: 2 ceftriaxone          Social History:  reports that he  quit smoking about 20 months ago. His smoking use included Cigarettes. He smoked 0.30 packs per day. He has never used smokeless tobacco. He reports that he does not drink alcohol or use illicit drugs.  Family History  Problem Relation Age of Onset  . Diabetes Mother   . Dementia Mother   . Atrial fibrillation Mother   . Hypertension Mother   . Cancer Father   . Emphysema Father     General ROS: +nausea, hiccoughs. + headaches. taking po ok. has had BM post-op. has urinated once.  continued hip pain. see HPI.   Blood pressure 84/57, pulse 60, temperature 98.1 F (36.7 C), temperature source Oral, resp. rate 20, height 6' 2"  (1.88 m), weight 62.823 kg (138 lb 8 oz), SpO2 100 %. General appearance: alert, cooperative and no distress Eyes: negative findings: conjunctivae and sclerae normal and pupils equal, round, reactive to light and accomodation Throat: normal findings: oropharynx pink & moist without lesions or evidence of thrush and abnormal findings: dentition: multiple carries Neck: no adenopathy and supple, symmetrical, trachea midline Lungs: clear to auscultation bilaterally Heart: regular rate and rhythm Abdomen: normal findings: bowel sounds normal and soft, non-tender Extremities: R hip dressings clean. L hip mild swelling. RUE PIC clean. no LE ankle edema.    Results for orders placed or performed during the hospital encounter of 03/15/15 (from the past 48 hour(s))  CBC     Status: Abnormal   Collection Time: 03/15/15  3:10 PM  Result Value Ref Range   WBC 11.4 (H) 4.0 - 10.5 K/uL   RBC 3.19 (L) 4.22 - 5.81 MIL/uL   Hemoglobin 9.6 (L) 13.0 - 17.0 g/dL   HCT 29.7 (L) 39.0 - 52.0 %   MCV 93.1 78.0 - 100.0 fL   MCH  30.1 26.0 - 34.0 pg   MCHC 32.3 30.0 - 36.0 g/dL   RDW 16.0 (H) 11.5 - 15.5 %   Platelets 338 150 - 400 K/uL  Basic metabolic panel     Status: Abnormal   Collection Time: 03/15/15  3:10 PM  Result Value Ref Range   Sodium 142 135 - 145 mmol/L   Potassium 3.7 3.5 - 5.1 mmol/L   Chloride 109 101 - 111 mmol/L   CO2 21 (L) 22 - 32 mmol/L   Glucose, Bld 93 65 - 99 mg/dL   BUN 16 6 - 20 mg/dL   Creatinine, Ser 1.14 0.61 - 1.24 mg/dL   Calcium 9.0 8.9 - 10.3 mg/dL   GFR calc non Af Amer >60 >60 mL/min   GFR calc Af Amer >60 >60 mL/min    Comment: (NOTE) The eGFR has been calculated using the CKD EPI equation. This calculation has not been validated in all clinical situations. eGFR's persistently <60 mL/min signify possible Chronic Kidney Disease.    Anion gap 12 5 - 15  Protime-INR     Status: Abnormal   Collection Time: 03/15/15  3:10 PM  Result Value Ref Range   Prothrombin Time 16.0 (H) 11.6 - 15.2 seconds   INR 1.26 0.00 - 1.49  APTT     Status: None   Collection Time: 03/15/15  3:10 PM  Result Value Ref Range   aPTT 34 24 - 37 seconds  Type and screen Order type and screen if day of surgery is less than 15 days from draw of preadmission visit or order morning of surgery if day of surgery is greater than 6 days from preadmission visit.     Status:  None   Collection Time: 03/15/15  4:15 PM  Result Value Ref Range   ABO/RH(D) B POS    Antibody Screen NEG    Sample Expiration 03/18/2015   CBC     Status: Abnormal   Collection Time: 03/16/15  5:56 AM  Result Value Ref Range   WBC 10.2 4.0 - 10.5 K/uL   RBC 2.90 (L) 4.22 - 5.81 MIL/uL   Hemoglobin 9.0 (L) 13.0 - 17.0 g/dL   HCT 27.8 (L) 39.0 - 52.0 %   MCV 95.9 78.0 - 100.0 fL   MCH 31.0 26.0 - 34.0 pg   MCHC 32.4 30.0 - 36.0 g/dL   RDW 16.5 (H) 11.5 - 15.5 %   Platelets 318 150 - 400 K/uL  Basic metabolic panel     Status: Abnormal   Collection Time: 03/16/15  5:56 AM  Result Value Ref Range   Sodium 139 135 - 145  mmol/L   Potassium 4.6 3.5 - 5.1 mmol/L    Comment: DELTA CHECK NOTED REPEATED TO VERIFY    Chloride 108 101 - 111 mmol/L   CO2 25 22 - 32 mmol/L   Glucose, Bld 171 (H) 65 - 99 mg/dL   BUN 20 6 - 20 mg/dL   Creatinine, Ser 0.85 0.61 - 1.24 mg/dL   Calcium 8.4 (L) 8.9 - 10.3 mg/dL   GFR calc non Af Amer >60 >60 mL/min   GFR calc Af Amer >60 >60 mL/min    Comment: (NOTE) The eGFR has been calculated using the CKD EPI equation. This calculation has not been validated in all clinical situations. eGFR's persistently <60 mL/min signify possible Chronic Kidney Disease.    Anion gap 6 5 - 15      Component Value Date/Time   SDES URINE, RANDOM 09/15/2013 1932   SPECREQUEST NONE 09/15/2013 1932   CULT  09/15/2013 1932    ENTEROCOCCUS SPECIES Performed at [facility]   REPTSTATUS 09/18/2013 FINAL 09/15/2013 1932   No results found. No results found for this or any previous visit (from the past 240 hour(s)).    03/16/2015, 4:26 PM     LOS: 1 day    Records and images were personally reviewed where available.

## 2015-03-16 NOTE — Evaluation (Addendum)
Occupational Therapy Evaluation Patient Details Name: [name] [name] MRN: [id number] DOB: 11-12-1960 Today's Date: 03/16/2015    History of Present Illness s/p R hip I & D; head ball and poly liner exchange   Clinical Impression   This 55 year old man was admitted for the above surgery.  All education was completed. No further OT is needed at this time    Follow Up Recommendations  No OT follow up    Equipment Recommendations  None recommended by OT    Recommendations for Other Services       Precautions / Restrictions Precautions Precautions: Posterior Hip;Fall Restrictions Weight Bearing Restrictions: No Other Position/Activity Restrictions: WBAT      Mobility Bed Mobility               General bed mobility comments: oob  Transfers Overall transfer level: Needs assistance Equipment used: Rolling walker (2 wheeled) Transfers: Sit to/from Stand Sit to Stand: Min guard         General transfer comment: for safety. Cues for LE placement    Balance                                            ADL Overall ADL's : Needs assistance/impaired     Grooming: Set up;Sitting   Upper Body Bathing: Set up;Sitting   Lower Body Bathing: Min guard;Sit to/from stand   Upper Body Dressing : Set up;Sitting   Lower Body Dressing: Minimal assistance;Sit to/from stand                 General ADL Comments: worked with AE and I recommended a couple of minor adjustments with his technique to follow THPs.  pt had a tendency to internally rotate RLE when holding sock aide between legs, so I recommended he stabilize against trunk. He also had a tendency to lean forward with shoulders--cued to keep them over hips.  pt wears velcro closure tennis shoes and he has difficulty with fasteners.  Recommended he either have sister assist with this or switch to pretied shoes.  Demonstrated use of reacher and sock aide.  Pt verbalizes that he feels comfortable  with toilet and shower transfers.  He holds to 2 rails when getting into shower.  He has not backed in using RW and wants to keep his same technique     Vision     Perception     Praxis      Pertinent Vitals/Pain Pain Assessment: 0-10 Pain Score: 4  Pain Location: R hip Pain Descriptors / Indicators: Sore Pain Intervention(s): Limited activity within patient's tolerance;Monitored during session;Premedicated before session;Repositioned     Hand Dominance     Extremity/Trunk Assessment Upper Extremity Assessment Upper Extremity Assessment: Overall WFL for tasks assessed           Communication Communication Communication: No difficulties   Cognition Arousal/Alertness: Awake/alert Behavior During Therapy: WFL for tasks assessed/performed Overall Cognitive Status: Within Functional Limits for tasks assessed                     General Comments       Exercises       Shoulder Instructions      Home Living Family/patient expects to be discharged to:: Private residence Living Arrangements: Other relatives Available Help at Discharge: Friend(s);Family;Available 24 hours/day Type of Home: House Home Access: Ramped entrance  Home Layout: One level     Bathroom Shower/Tub: Occupational psychologist: Standard     Home Equipment: Toilet riser;Shower seat;Bedside commode;Walker - 2 wheels;Cane - single point   Additional Comments: Pt states plans to dc home with sister       Prior Functioning/Environment Level of Independence: Independent with assistive device(s)        Comments: works shorter shifts, uses cane at times    OT Diagnosis: Acute pain   OT Problem List:     OT Treatment/Interventions:      OT Goals(Current goals can be found in the care plan section) Acute Rehab OT Goals Patient Stated Goal: get back to being independent OT Goal Formulation: All assessment and education complete, DC therapy  OT Frequency:     Barriers  to D/C:            Co-evaluation              End of Session    Activity Tolerance: Patient tolerated treatment well Patient left: in chair;with call bell/phone within reach;with nursing/sitter in room (left alarm off chair for NT to continue working with pt)   Time: [id number] OT Time Calculation (min): 21 min Charges:  OT General Charges $OT Visit: 1 Procedure OT Evaluation $OT Eval Low Complexity: 1 Procedure G-Codes:    Oluwademilade Kellett 03-26-15, 12:10 PM  [name], OTR/L [phone] Mar 26, 2015

## 2015-03-16 NOTE — Care Management Note (Signed)
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 Action/Plan: Discharge plannning Expected Discharge Date:                  Expected Discharge Plan:  [place]  In-House Referral:     Discharge planning Services  CM Consult  Post Acute Care Choice:  Home Health Choice offered to:  Patient  DME Arranged:  IV pump/equipment DME Agency:  [place] Arranged:  PT, RN [facility] Agency:  [place]  Status of Service:  In process, will continue to follow  Medicare Important Message Given:    Date Medicare IM Given:    Medicare IM give by:    Date Additional Medicare IM Given:    Additional Medicare Important Message give by:     If discussed at [place] of Stay Meetings, dates discussed:    Additional Comments: CM notes (per OP note) pt will most likely go home with IV ABX (pending ID consult); [facility] monitoring for possible long term IV ABX; [facility] Go will provide HHRN/PT.  CM will continue to monitor for progress. Dellie Catholic, RN 03/16/2015, 3:15 PM

## 2015-03-17 ENCOUNTER — Inpatient Hospital Stay (HOSPITAL_COMMUNITY): Payer: BLUE CROSS/BLUE SHIELD

## 2015-03-17 DIAGNOSIS — M25552 Pain in left hip: Secondary | ICD-10-CM

## 2015-03-17 LAB — CBC
HCT: 24.9 % — ABNORMAL LOW (ref 39.0–52.0)
Hemoglobin: 8 g/dL — ABNORMAL LOW (ref 13.0–17.0)
MCH: 31 pg (ref 26.0–34.0)
MCHC: 32.1 g/dL (ref 30.0–36.0)
MCV: 96.5 fL (ref 78.0–100.0)
Platelets: 326 10*3/uL (ref 150–400)
RBC: 2.58 MIL/uL — ABNORMAL LOW (ref 4.22–5.81)
RDW: 16.6 % — AB (ref 11.5–15.5)
WBC: 14.1 10*3/uL — AB (ref 4.0–10.5)

## 2015-03-17 LAB — BASIC METABOLIC PANEL
ANION GAP: 9 (ref 5–15)
BUN: 27 mg/dL — ABNORMAL HIGH (ref 6–20)
CALCIUM: 8.7 mg/dL — AB (ref 8.9–10.3)
CO2: 26 mmol/L (ref 22–32)
CREATININE: 1.14 mg/dL (ref 0.61–1.24)
Chloride: 108 mmol/L (ref 101–111)
Glucose, Bld: 147 mg/dL — ABNORMAL HIGH (ref 65–99)
Potassium: 4.4 mmol/L (ref 3.5–5.1)
SODIUM: 143 mmol/L (ref 135–145)

## 2015-03-17 LAB — C-REACTIVE PROTEIN: CRP: 11.2 mg/dL — AB (ref <1.0)

## 2015-03-17 NOTE — H&P (Signed)
[name] [name] is an 55 y.o. male.    Chief Complaint:  Infected right total hip arthroplasty   HPI: Pt is a 55 y.o. male complaining of right hip pain for about 1 weeks. Pain had continually increased since the beginning. He was on a trip and his hip started hurting.  He did follow up with a doctor who thought he had some fluid on his hip.  Upon presentation to the clinic there was a pocket of fluid under the skin.  This area was aspirated and sent for evaluation.  Dr. [name] reviewed the results, called the patient ans had the present to the hospital to have and I&D of the right hip.  Various options are discussed with the patient. Risks, benefits and expectations were discussed with the patient. Patient understand the risks, benefits and expectations and wishes to proceed with surgery.    PCP: [name], MD  D/C Plans:      Home with HHPT  Post-op Meds:       No Rx given    FYI:     ASA post-op  Norco / tramadol post-op  PICC  ID Consult   PMH: Past Medical History  Diagnosis Date  . Radiation   . HIV (human immunodeficiency virus infection) ([place])   . Shingles     left shoulder, right leg  . Pain in limb 07/23/2013  . 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 H/O hypotension   . Arthritis   . History of gout   . Hx of sepsis 2015    affected rt hip / femur  . Exposure to hepatitis B   . 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 History of transfusion   . Decubitus ulcer of sacral area     PSH: Past Surgical History  Procedure Laterality Date  . Hematoma evacuation    . Incision and drainage hip Right 07/31/2013    Procedure: IRRIGATION AND DEBRIDEMENT HIP WITH PERCUTANEOUS DRAIN PLACEMENT;  Surgeon: [name], MD;  Location: [facility];  Service: Orthopedics;  Laterality: Right;  . Total hip arthroplasty Right 08/04/2013    Procedure: RESECTION HIP JOINT - PLACEMENT OF CEMENT PROSTHESIS;  Surgeon: [name], MD;  Location: [facility];  Service:  Orthopedics;  Laterality: Right;  . Pilonidal cyst drainage N/A 08/09/2013    Procedure: IRRIGATION AND DEBRIDEMENT SACRAL DECUBITUS;  Surgeon: Harl Bowie, MD;  Location: [facility];  Service: General;  Laterality: N/A;  . Incision and drainage of wound N/A 08/25/2013    Procedure: IRRIGATION AND DEBRIDEMENT OF SACRAL ULCER WITH PLACEMENT OF A CELL ;  Surgeon: Theodoro Kos, DO;  Location: [facility];  Service: Plastics;  Laterality: N/A;  . Incision and drainage of wound N/A 09/01/2013    Procedure: IRRIGATION AND DEBRIDEMENT SACRAL ULCER WITH PLACEMENT OF A CELL;  Surgeon: Theodoro Kos, DO;  Location: [facility];  Service: Plastics;  Laterality: N/A;  . Incision and drainage of wound N/A 09/08/2013    Procedure: IRRIGATION AND DEBRIDEMENT OF SACRAL ULCER WITH PLACEMENT OF A CELL AND VAC;  Surgeon: Theodoro Kos, DO;  Location: [facility];  Service: Plastics;  Laterality: N/A;  . Incision and drainage of wound N/A 09/18/2013    Procedure: IRRIGATION AND DEBRIDEMENT OF SACRAL WOUND WITH PLACEMENT OF A-CELL;  Surgeon: Theodoro Kos, DO;  Location: [facility];  Service: Plastics;  Laterality: N/A;  . Hernia repair  right X2  . Transurethral resection of prostate  JAN 2016  . Cystoscopy    . Conversion to total hip Right 06/15/2014    Procedure: REMOVAL OF ANTIBIOTIC  SPACER AND CONVERSION TO RIGHT TOTAL HIP  ARTHROPLASTY ;  Surgeon: Paralee Cancel, MD;  Location: [facility];  Service: Orthopedics;  Laterality: Right;  . Incision and drainage hip Right 03/15/2015    Procedure: IRRIGATION AND DEBRIDEMENT RIGHT HIP WITH  HEAD BALL  AND POLY LINER EXCHANGE;  Surgeon: Paralee Cancel, MD;  Location: [facility];  Service: Orthopedics;  Laterality: Right;    Social History:  reports that he quit smoking about 20 months ago. His smoking use included Cigarettes. He smoked 0.30 packs per day. He has never used smokeless tobacco. He reports that he does not drink alcohol or use illicit drugs.  Allergies:  Allergies  Allergen Reactions  .  Collagen Rash    redness    Medications: Current Facility-Administered Medications  Medication Dose Route Frequency Provider Last Rate Last Dose  . alum & mag hydroxide-simeth (MAALOX/MYLANTA) 200-200-20 MG/5ML suspension 30 mL  30 mL Oral Q4H PRN Danae Orleans, PA-C      . aspirin EC tablet 325 mg  325 mg Oral BID Danae Orleans, PA-C   325 mg at 03/17/15 0726  . bisacodyl (DULCOLAX) suppository 10 mg  10 mg Rectal Daily PRN Danae Orleans, PA-C      . cefTRIAXone (ROCEPHIN) 2 g in dextrose 5 % 50 mL IVPB  2 g Intravenous Q24H Paralee Cancel, MD   2 g at 03/16/15 2000  . darunavir-cobicistat (PREZCOBIX) 800-150 MG per tablet 1 tablet  1 tablet Oral Q supper Danae Orleans, PA-C   1 tablet at 03/16/15 1836  . dexamethasone (DECADRON) injection 10 mg  10 mg Intravenous Once Danae Orleans, PA-C   Stopped at 03/16/15 1810  . diphenhydrAMINE (BENADRYL) capsule 25 mg  25 mg Oral Q6H PRN Danae Orleans, PA-C      . docusate sodium (COLACE) capsule 100 mg  100 mg Oral BID Danae Orleans, PA-C   100 mg at 03/17/15 0726  . dolutegravir (TIVICAY) tablet 50 mg  50 mg Oral QHS Danae Orleans, PA-C   50 mg at 03/16/15 1836  . feeding supplement (ENSURE ENLIVE) (ENSURE ENLIVE) liquid 237 mL  237 mL Oral TID PRN Danae Orleans, PA-C      . ferrous sulfate tablet 325 mg  325 mg Oral TID PC Danae Orleans, PA-C   325 mg at 03/16/15 1836  . HYDROcodone-acetaminophen (NORCO) 7.5-325 MG per tablet 1-2 tablet  1-2 tablet Oral Q4H Danae Orleans, PA-C   1 tablet at 03/17/15 0726  . HYDROmorphone (DILAUDID) injection 0.5-1 mg  0.5-1 mg Intravenous Q2H PRN Danae Orleans, PA-C      . magnesium citrate solution 1 Bottle  1 Bottle Oral Once PRN Danae Orleans, PA-C      . menthol-cetylpyridinium (CEPACOL) lozenge 3 mg  1 lozenge Oral PRN Danae Orleans, PA-C       Or  . phenol (CHLORASEPTIC) mouth spray 1 spray  1 spray Mouth/Throat PRN Danae Orleans, PA-C      . methocarbamol (ROBAXIN) tablet 500 mg  500 mg Oral Q6H  PRN Danae Orleans, PA-C   500 mg at 03/16/15 0815   Or  . methocarbamol (ROBAXIN) 500 mg in dextrose 5 % 50 mL IVPB  500 mg Intravenous Q6H PRN Danae Orleans, PA-C   500 mg at 03/15/15 2015  . metoCLOPramide (REGLAN) tablet 5-10 mg  5-10 mg Oral Q8H PRN Danae Orleans, PA-C   10 mg at 03/16/15 1649   Or  . metoCLOPramide (REGLAN) injection 5-10 mg  5-10 mg Intravenous Q8H PRN Danae Orleans, PA-C   10 mg at 03/17/15 0141  . ondansetron (ZOFRAN) tablet 4 mg  4 mg Oral Q6H PRN Danae Orleans, PA-C       Or  . ondansetron [facility]) injection 4 mg  4 mg Intravenous Q6H PRN Danae Orleans, PA-C   4 mg at 03/16/15 2030  . polyethylene glycol (MIRALAX / GLYCOLAX) packet 17 g  17 g Oral BID Danae Orleans, PA-C   Stopped at 03/16/15 0815  . rilpivirine (EDURANT) tablet 25 mg  25 mg Oral Q supper Danae Orleans, PA-C   25 mg at 03/16/15 1836  . sodium chloride 0.9 % 1,000 mL with potassium chloride 10 mEq infusion  100 mL/hr Intravenous Continuous Danae Orleans, PA-C 100 mL/hr at 03/16/15 0007 100 mL/hr at 03/16/15 0007  . sodium chloride flush (NS) 0.9 % injection 10-40 mL  10-40 mL Intracatheter PRN Paralee Cancel, MD   10 mL at 03/16/15 1557  . tamsulosin (FLOMAX) capsule 0.4 mg  0.4 mg Oral Daily Danae Orleans, PA-C   0.4 mg at 03/17/15 5916    Results for orders placed or performed during the hospital encounter of 03/15/15 (from the past 48 hour(s))  CBC     Status: Abnormal   Collection Time: 03/15/15  3:10 PM  Result Value Ref Range   WBC 11.4 (H) 4.0 - 10.5 K/uL   RBC 3.19 (L) 4.22 - 5.81 MIL/uL   Hemoglobin 9.6 (L) 13.0 - 17.0 g/dL   HCT 29.7 (L) 39.0 - 52.0 %   MCV 93.1 78.0 - 100.0 fL   MCH 30.1 26.0 - 34.0 pg   MCHC 32.3 30.0 - 36.0 g/dL   RDW 16.0 (H) 11.5 - 15.5 %   Platelets 338 150 - 400 K/uL  Basic metabolic panel     Status: Abnormal   Collection Time: 03/15/15  3:10 PM  Result Value Ref Range   Sodium 142 135 - 145 mmol/L   Potassium 3.7 3.5 - 5.1 mmol/L   Chloride 109  101 - 111 mmol/L   CO2 21 (L) 22 - 32 mmol/L   Glucose, Bld 93 65 - 99 mg/dL   BUN 16 6 - 20 mg/dL   Creatinine, Ser 1.14 0.61 - 1.24 mg/dL   Calcium 9.0 8.9 - 10.3 mg/dL   GFR calc non Af Amer >60 >60 mL/min   GFR calc Af Amer >60 >60 mL/min    Comment: (NOTE) The eGFR has been calculated using the CKD EPI equation. This calculation has not been validated in all clinical situations. eGFR's persistently <60 mL/min signify possible Chronic Kidney Disease.    Anion gap 12 5 - 15  Protime-INR     Status: Abnormal   Collection Time: 03/15/15  3:10 PM  Result Value Ref Range   Prothrombin Time 16.0 (H) 11.6 - 15.2 seconds   INR 1.26 0.00 - 1.49  APTT     Status: None   Collection Time: 03/15/15  3:10 PM  Result Value Ref Range   aPTT 34 24 - 37 seconds  Type and screen Order type and screen if day of surgery is less than 15 days from draw of preadmission visit or order morning of surgery if day of surgery is greater than 6 days from preadmission visit.     Status: None   Collection  Time: 03/15/15  4:15 PM  Result Value Ref Range   ABO/RH(D) B POS    Antibody Screen NEG    Sample Expiration 03/18/2015   CBC     Status: Abnormal   Collection Time: 03/16/15  5:56 AM  Result Value Ref Range   WBC 10.2 4.0 - 10.5 K/uL   RBC 2.90 (L) 4.22 - 5.81 MIL/uL   Hemoglobin 9.0 (L) 13.0 - 17.0 g/dL   HCT 27.8 (L) 39.0 - 52.0 %   MCV 95.9 78.0 - 100.0 fL   MCH 31.0 26.0 - 34.0 pg   MCHC 32.4 30.0 - 36.0 g/dL   RDW 16.5 (H) 11.5 - 15.5 %   Platelets 318 150 - 400 K/uL  Basic metabolic panel     Status: Abnormal   Collection Time: 03/16/15  5:56 AM  Result Value Ref Range   Sodium 139 135 - 145 mmol/L   Potassium 4.6 3.5 - 5.1 mmol/L    Comment: DELTA CHECK NOTED REPEATED TO VERIFY    Chloride 108 101 - 111 mmol/L   CO2 25 22 - 32 mmol/L   Glucose, Bld 171 (H) 65 - 99 mg/dL   BUN 20 6 - 20 mg/dL   Creatinine, Ser 0.85 0.61 - 1.24 mg/dL   Calcium 8.4 (L) 8.9 - 10.3 mg/dL   GFR calc  non Af Amer >60 >60 mL/min   GFR calc Af Amer >60 >60 mL/min    Comment: (NOTE) The eGFR has been calculated using the CKD EPI equation. This calculation has not been validated in all clinical situations. eGFR's persistently <60 mL/min signify possible Chronic Kidney Disease.    Anion gap 6 5 - 15  Sedimentation rate     Status: Abnormal   Collection Time: 03/16/15  9:00 PM  Result Value Ref Range   Sed Rate 118 (H) 0 - 16 mm/hr  C-reactive protein     Status: Abnormal   Collection Time: 03/16/15  9:00 PM  Result Value Ref Range   CRP 11.2 (H) <1.0 mg/dL    Comment: Performed at [facility]  CBC     Status: Abnormal   Collection Time: 03/17/15  4:49 AM  Result Value Ref Range   WBC 14.1 (H) 4.0 - 10.5 K/uL   RBC 2.58 (L) 4.22 - 5.81 MIL/uL   Hemoglobin 8.0 (L) 13.0 - 17.0 g/dL   HCT 24.9 (L) 39.0 - 52.0 %   MCV 96.5 78.0 - 100.0 fL   MCH 31.0 26.0 - 34.0 pg   MCHC 32.1 30.0 - 36.0 g/dL   RDW 16.6 (H) 11.5 - 15.5 %   Platelets 326 150 - 400 K/uL  Basic metabolic panel     Status: Abnormal   Collection Time: 03/17/15  4:49 AM  Result Value Ref Range   Sodium 143 135 - 145 mmol/L   Potassium 4.4 3.5 - 5.1 mmol/L   Chloride 108 101 - 111 mmol/L   CO2 26 22 - 32 mmol/L   Glucose, Bld 147 (H) 65 - 99 mg/dL   BUN 27 (H) 6 - 20 mg/dL   Creatinine, Ser 1.14 0.61 - 1.24 mg/dL   Calcium 8.7 (L) 8.9 - 10.3 mg/dL   GFR calc non Af Amer >60 >60 mL/min   GFR calc Af Amer >60 >60 mL/min    Comment: (NOTE) The eGFR has been calculated using the CKD EPI equation. This calculation has not been validated in all clinical situations. eGFR's persistently <60 mL/min signify possible  Chronic Kidney Disease.    Anion gap 9 5 - 15      Review of Systems  Constitutional: Negative.   HENT: Negative.   Eyes: Negative.   Respiratory: Negative.   Cardiovascular: Negative.   Gastrointestinal: Negative.   Genitourinary: Negative.   Musculoskeletal: Positive for joint pain.  Skin:  Negative.   Neurological: Negative.   Endo/Heme/Allergies: Negative.   Psychiatric/Behavioral: Negative.       Physical Exam  Constitutional: He is oriented to person, place, and time. He appears well-developed.  HENT:  Head: Normocephalic.  Eyes: Pupils are equal, round, and reactive to light.  Neck: Neck supple. No JVD present. No tracheal deviation present. No thyromegaly present.  Cardiovascular: Normal rate, regular rhythm and intact distal pulses.   Respiratory: Effort normal and breath sounds normal. No stridor.  GI: Soft. There is no tenderness. There is no guarding.  Musculoskeletal:       Right hip: He exhibits decreased range of motion, decreased strength, tenderness, swelling and laceration (healed previous incision with area of redness at the distal end). He exhibits no bony tenderness.  Lymphadenopathy:    He has no cervical adenopathy.  Neurological: He is alert and oriented to person, place, and time.  Skin: Skin is warm and dry.  Psychiatric: He has a normal mood and affect.       Assessment/Plan Assessment:      Infected right total hip arthroplasty  Plan: Patient will undergo a I&D of the right total hip arthroplasty on 03/15/2015 per Dr. [name] at [facility]. Risks benefits and expectations were discussed with the patient. Patient understand risks, benefits and expectations and wishes to proceed.   West Pugh Saleemah Mollenhauer   PA-C  03/17/2015, 8:47 AM

## 2015-03-17 NOTE — Progress Notes (Signed)
   03/17/15 1400  PT Visit Information  Last PT Received On 03/17/15  Reason Eval/Treat Not Completed Patient at procedure or test/unavailable (MRI)

## 2015-03-17 NOTE — Progress Notes (Signed)
Physical Therapy Treatment Patient Details Name: [name] [name] MRN: [id number] DOB: 06-15-1960 Today's Date: 03/17/2015    History of Present Illness s/p R hip I & D; head ball and poly liner exchange; PMHx: HIV+, anal CA    PT Comments    POD # 2 pm session Pt seen after MRI.  Pre medicated.  Assisted OOB to amb a greater distance in hallway.  Pain 2/10 R hip.  Good alternating gait.  No other issues.  Assisted back to bed and applied ICE.   Follow Up Recommendations  Home health PT     Equipment Recommendations       Recommendations for Other Services       Precautions / Restrictions Precautions Precautions: Posterior Hip;Fall Restrictions Weight Bearing Restrictions: No Other Position/Activity Restrictions: WBAT    Mobility  Bed Mobility Overal bed mobility: Modified Independent Bed Mobility: Supine to Sit;Sit to Supine     Supine to sit: Modified independent (Device/Increase time) Sit to supine: Modified independent (Device/Increase time)   General bed mobility comments: increased time  Transfers Overall transfer level: Needs assistance Equipment used: Rolling walker (2 wheeled) Transfers: Sit to/from Stand Sit to Stand: Supervision;From elevated surface         General transfer comment:  Cues for hand placement and safety  Ambulation/Gait Ambulation/Gait assistance: Supervision;Min guard Ambulation Distance (Feet): 185 Feet Assistive device: Rolling walker (2 wheeled) Gait Pattern/deviations: Step-to pattern;Decreased stride length     General Gait Details: one VC safety with turns and backward gait   Stairs            Wheelchair Mobility    Modified Rankin (Stroke Patients Only)       Balance                                    Cognition Arousal/Alertness: Awake/alert Behavior During Therapy: WFL for tasks assessed/performed Overall Cognitive Status: Within Functional Limits for tasks assessed                       Exercises      General Comments        Pertinent Vitals/Pain Pain Assessment: 0-10 Pain Score: 2  Pain Location: R hip Pain Descriptors / Indicators: Aching;Sore Pain Intervention(s): Monitored during session;Premedicated before session;Repositioned;Ice applied    Home Living                      Prior Function            PT Goals (current goals can now be found in the care plan section) Progress towards PT goals: Progressing toward goals    Frequency  7X/week    PT Plan Current plan remains appropriate    Co-evaluation             End of Session Equipment Utilized During Treatment: Gait belt Activity Tolerance: Patient tolerated treatment well Patient left: in bed;with call bell/phone within reach;with bed alarm set;with family/visitor present     Time: 1535-1550 PT Time Calculation (min) (ACUTE ONLY): 15 min  Charges:  $Gait Training: 8-22 mins                    G Codes:      [name] [name]  PTA [facility]  Acute  Rehab Pager      [phone]

## 2015-03-17 NOTE — Progress Notes (Addendum)
     Subjective: 2 Days Post-Op Procedure(s) (LRB): IRRIGATION AND DEBRIDEMENT RIGHT HIP WITH  HEAD BALL  AND POLY LINER EXCHANGE (Right)   Patient reports pain as mild, pain controlled. No events throughout the night. Still c/o so left hip pain, do to his hx on the right hip we will obtain a MRI to evaluate the left hip.  Objective:   VITALS:   Filed Vitals:   03/17/15 0138 03/17/15 0604  BP: 110/68 115/65  Pulse: 59 57  Temp: 97.6 F (36.4 C) 97.8 F (36.6 C)  Resp: 20 19    Dorsiflexion/Plantar flexion intact Incision: dressing C/D/I  LABS  Recent Labs  03/15/15 1510 03/16/15 0556 03/17/15 0449  HGB 9.6* 9.0* 8.0*  HCT 29.7* 27.8* 24.9*  WBC 11.4* 10.2 14.1*  PLT 338 318 326     Recent Labs  03/15/15 1510 03/16/15 0556 03/17/15 0449  NA 142 139 143  K 3.7 4.6 4.4  BUN 16 20 27*  CREATININE 1.14 0.85 1.14  GLUCOSE 93 171* 147*     Assessment/Plan: 2 Days Post-Op Procedure(s) (LRB): IRRIGATION AND DEBRIDEMENT RIGHT HIP WITH  HEAD BALL  AND POLY LINER EXCHANGE (Right) Up with therapy Discharge home with home health eventually, when ready Appreciate Dr. Algis Downs recommendation in treating Mr Jeannot.   LEFT HIP PAIN MRI to further evaluate      West Pugh. Maryalyce Sanjuan   PAC  03/17/2015, 8:31 AM

## 2015-03-17 NOTE — Progress Notes (Signed)
Physical Therapy Treatment Patient Details Name: [name] [name] MRN: [id number] DOB: May 09, 1960 Today's Date: 03-20-2015    History of Present Illness s/p R hip I & D; head ball and poly liner exchange; PMHx: HIV+, anal CA    PT Comments    Pt progressing well; reports hip is not as sore overall today; deferred ex this am so pt could visit with his boss  Follow Up Recommendations  Home health PT     Equipment Recommendations  None recommended by PT    Recommendations for Other Services       Precautions / Restrictions Precautions Precautions: Posterior Hip;Fall Restrictions Weight Bearing Restrictions: No Other Position/Activity Restrictions: WBAT    Mobility  Bed Mobility Overal bed mobility: Modified Independent                Transfers Overall transfer level: Needs assistance Equipment used: Rolling walker (2 wheeled) Transfers: Sit to/from Stand Sit to Stand: Supervision;From elevated surface         General transfer comment:  Cues for hand placement and safety  Ambulation/Gait Ambulation/Gait assistance: Supervision Ambulation Distance (Feet): 100 Feet Assistive device: Rolling walker (2 wheeled) Gait Pattern/deviations: Step-through pattern     General Gait Details: cues for safety with turns   Stairs            Wheelchair Mobility    Modified Rankin (Stroke Patients Only)       Balance                                    Cognition Arousal/Alertness: Awake/alert Behavior During Therapy: WFL for tasks assessed/performed Overall Cognitive Status: Within Functional Limits for tasks assessed                      Exercises      General Comments        Pertinent Vitals/Pain Pain Assessment: 0-10 Pain Score: 2  Pain Location: r hip Pain Descriptors / Indicators: Sore Pain Intervention(s): Limited activity within patient's tolerance;Monitored during session;Premedicated before session    Home Living                       Prior Function            PT Goals (current goals can now be found in the care plan section) Acute Rehab PT Goals Patient Stated Goal: get back to being independent PT Goal Formulation: With patient Time For Goal Achievement: 03/19/15 Potential to Achieve Goals: Good Progress towards PT goals: Progressing toward goals    Frequency  7X/week    PT Plan Current plan remains appropriate    Co-evaluation             End of Session Equipment Utilized During Treatment: Gait belt Activity Tolerance: Patient tolerated treatment well Patient left: in bed;with call bell/phone within reach;with bed alarm set;with family/visitor present     Time: 1125-1150 PT Time Calculation (min) (ACUTE ONLY): 25 min  Charges:  $Gait Training: 23-37 mins                    G Codes:      Eulah Walkup 03/20/15, 12:13 PM

## 2015-03-17 NOTE — Progress Notes (Signed)
INFECTIOUS DISEASE PROGRESS NOTE  ID: [name] [name] is a 55 y.o. male with  Active Problems:   Prosthetic hip infection ([place])  Subjective: Without complaints Has been eating. occas episode of hiccoughs.   Abtx:  Anti-infectives    Start     Dose/Rate Route Frequency Ordered Stop   03/15/15 2200  darunavir-cobicistat (PREZCOBIX) 800-150 MG per tablet 1 tablet     1 tablet Oral Daily with supper 03/15/15 2107     03/15/15 2200  dolutegravir (TIVICAY) tablet 50 mg     50 mg Oral Daily at bedtime 03/15/15 2107     03/15/15 2200  rilpivirine (EDURANT) tablet 25 mg     25 mg Oral Daily with supper 03/15/15 2107     03/15/15 2200  cefTRIAXone (ROCEPHIN) 2 g in dextrose 5 % 50 mL IVPB     2 g 100 mL/hr over 30 Minutes Intravenous Every 24 hours 03/15/15 2132     03/15/15 1815  ceFAZolin (ANCEF) IVPB 2 g/50 mL premix     2 g 100 mL/hr over 30 Minutes Intravenous  Once 03/15/15 1812 03/15/15 1818   03/15/15 1600  vancomycin (VANCOCIN) IVPB 1000 mg/200 mL premix     1,000 mg 200 mL/hr over 60 Minutes Intravenous On call to O.R. 03/15/15 1529 03/15/15 1728      Medications:  Scheduled: . aspirin EC  325 mg Oral BID  . cefTRIAXone (ROCEPHIN)  IV  2 g Intravenous Q24H  . darunavir-cobicistat  1 tablet Oral Q supper  . dexamethasone  10 mg Intravenous Once  . docusate sodium  100 mg Oral BID  . dolutegravir  50 mg Oral QHS  . ferrous sulfate  325 mg Oral TID PC  . HYDROcodone-acetaminophen  1-2 tablet Oral Q4H  . polyethylene glycol  17 g Oral BID  . rilpivirine  25 mg Oral Q supper  . tamsulosin  0.4 mg Oral Daily    Objective: Vital signs in last 24 hours: Temp:  [97.6 F (36.4 C)-98.8 F (37.1 C)] 97.7 F (36.5 C) (03/08 1318) Pulse Rate:  [56-62] 62 (03/08 1318) Resp:  [18-20] 18 (03/08 1318) BP: (98-123)/(58-69) 123/69 mmHg (03/08 1318) SpO2:  [99 %-100 %] 100 % (03/08 1318)   General appearance: alert, cooperative and no distress Resp: clear to auscultation  bilaterally Cardio: regular rate and rhythm GI: normal findings: bowel sounds normal and soft, non-tender  Lab Results  Recent Labs  03/16/15 0556 03/17/15 0449  WBC 10.2 14.1*  HGB 9.0* 8.0*  HCT 27.8* 24.9*  NA 139 143  K 4.6 4.4  CL 108 108  CO2 25 26  BUN 20 27*  CREATININE 0.85 1.14   Liver Panel No results for input(s): PROT, ALBUMIN, AST, ALT, ALKPHOS, BILITOT, BILIDIR, IBILI in the last 72 hours. Sedimentation Rate  Recent Labs  03/16/15 2100  ESRSEDRATE 118*   C-Reactive Protein  Recent Labs  03/16/15 2100  CRP 11.2*    Microbiology: No results found for this or any previous visit (from the past 240 hour(s)).  Studies/Results: Mr Hip Left Wo Contrast  03/17/2015  CLINICAL DATA:  Left hip pain.  History of right hip replacement. EXAM: MR OF THE LEFT HIP WITHOUT CONTRAST TECHNIQUE: Multiplanar, multisequence MR imaging was performed. No intravenous contrast was administered. COMPARISON:  None. FINDINGS: Bones: Right total hip arthroplasty. Right periarticular fluid collection measuring 3.9 x 3.3 x 8.2 cm posterior to the arthroplasty with multiple small fluid fluid levels as can be seen with  particle disease. No left hip fracture, dislocation or avascular necrosis. Normal sacrum and sacroiliac joints. Articular cartilage and labrum Articular cartilage: Partial thickness cartilage loss of the left hip. Labrum: Grossly intact, but evaluation is limited by lack of intraarticular fluid. Joint or bursal effusion Joint effusion:  No left joint effusion. Bursae:  No bursa formation. Muscles and tendons Muscles and tendons: Generalized muscle edema within the adductor compartment muscles bilaterally. Mild muscle edema in the left gluteal musculature. Other findings Miscellaneous: No other fluid collection or hematoma. No pelvic free fluid. No inguinal hernia. No inguinal lymphadenopathy. IMPRESSION: 1. Generalized muscle edema within the adductor compartment muscles  bilaterally. Mild muscle edema in the left gluteal musculature. This may reflect myositis secondary to an infectious or inflammatory etiology. 2. Mild osteoarthritis of the left hip. 3. Right total hip arthroplasty. Right periarticular fluid collection as can be seen with particle disease. Electronically Signed   By: Kathreen Devoid   On: 03/17/2015 15:38     Assessment/Plan: HIV/AIDS  R THR infection  L hip pain- MRI shows mild edema, OA as above  Total days of antibiotics: 2 ceftriaxone Prezcobix/DTGV/RPV  No Cx to follow. Would give him ceftriaxone for 1 month Will see him back in clinic in 3-4 weeks         [name] Infectious Diseases (pager) [phone] [email or web address] 03/17/2015, 3:55 PM  LOS: 2 days

## 2015-03-17 NOTE — Progress Notes (Signed)
CM spoke with [facility] rep, Tim who states [facility] will be unable to provide services and pt states he wishes to have [facility] provide "everything." Referral called into [facility] rep, Jason for HHRN/PT (IV ABX). No other CM needs were communicated.

## 2015-03-17 NOTE — Anesthesia Postprocedure Evaluation (Signed)
Anesthesia Post Note  Patient: CELVIN PARA  Procedure(s) Performed: Procedure(s) (LRB): IRRIGATION AND DEBRIDEMENT RIGHT HIP WITH  HEAD BALL  AND POLY LINER EXCHANGE (Right)  Patient location during evaluation: PACU Anesthesia Type: General Level of consciousness: awake Pain management: pain level controlled Vital Signs Assessment: post-procedure vital signs reviewed and stable Respiratory status: spontaneous breathing Cardiovascular status: stable Postop Assessment: no signs of nausea or vomiting Anesthetic complications: no    Last Vitals:  Filed Vitals:   03/17/15 0138 03/17/15 0604  BP: 110/68 115/65  Pulse: 59 57  Temp: 36.4 C 36.6 C  Resp: 20 19    Last Pain:  Filed Vitals:   03/17/15 1118  PainSc: 2                  Lorijean Husser

## 2015-03-18 MED ORDER — FERROUS SULFATE 325 (65 FE) MG PO TABS: 325.0000 mg | ORAL_TABLET | Freq: Three times a day (TID) | ORAL | Status: DC

## 2015-03-18 MED ORDER — DOCUSATE SODIUM 100 MG PO CAPS: 100.0000 mg | ORAL_CAPSULE | Freq: Two times a day (BID) | ORAL | Status: DC

## 2015-03-18 MED ORDER — POLYETHYLENE GLYCOL 3350 17 G PO PACK: 17.0000 g | PACK | Freq: Two times a day (BID) | ORAL | Status: DC

## 2015-03-18 MED ORDER — ASPIRIN 325 MG PO TBEC: 325.0000 mg | DELAYED_RELEASE_TABLET | Freq: Two times a day (BID) | ORAL | Status: AC

## 2015-03-18 MED ORDER — METOCLOPRAMIDE HCL 10 MG PO TABS: 10.0000 mg | ORAL_TABLET | Freq: Three times a day (TID) | ORAL | Status: DC | PRN

## 2015-03-18 MED ORDER — HYDROCODONE-ACETAMINOPHEN 7.5-325 MG PO TABS: 1.0000 | ORAL_TABLET | Freq: Four times a day (QID) | ORAL | Status: DC | PRN

## 2015-03-18 MED ORDER — DEXTROSE 5 % IV SOLN: 2.0000 g | INTRAVENOUS | Status: DC

## 2015-03-18 NOTE — Progress Notes (Signed)
     Subjective: 3 Days Post-Op Procedure(s) (LRB): IRRIGATION AND DEBRIDEMENT RIGHT HIP WITH  HEAD BALL  AND POLY LINER EXCHANGE (Right)   Patient reports pain as mild, pain controlled. No events throughout the night.  Questions encouraged and answered. Ready to be discharged home after getting his antibiotic dose in the hospital.  Objective:   VITALS:   Filed Vitals:   03/17/15 2153 03/18/15 0516  BP: 115/74 112/75  Pulse: 60 58  Temp: 97.6 F (36.4 C) 97.8 F (36.6 C)  Resp: 16 16    Dorsiflexion/Plantar flexion intact Incision: dressing C/D/I No cellulitis present Compartment soft  LABS  Recent Labs  03/15/15 1510 03/16/15 0556 03/17/15 0449  HGB 9.6* 9.0* 8.0*  HCT 29.7* 27.8* 24.9*  WBC 11.4* 10.2 14.1*  PLT 338 318 326     Recent Labs  03/15/15 1510 03/16/15 0556 03/17/15 0449  NA 142 139 143  K 3.7 4.6 4.4  BUN 16 20 27*  CREATININE 1.14 0.85 1.14  GLUCOSE 93 171* 147*     Assessment/Plan: 3 Days Post-Op Procedure(s) (LRB): IRRIGATION AND DEBRIDEMENT RIGHT HIP WITH  HEAD BALL  AND POLY LINER EXCHANGE (Right) Up with therapy Discharge home with home health, after receiving his dose of antibiotics today Follow up in 2 weeks at [facility]. Follow up with OLIN,Abdi Husak D in 2 weeks.  Contact information:  [facility] [address] [place]   PAC  03/18/2015, 1:28 PM

## 2015-03-19 ENCOUNTER — Telehealth: Payer: Self-pay | Admitting: *Deleted

## 2015-03-19 NOTE — Telephone Encounter (Signed)
Patient needing copay card for Tivicay mailed to his home. Done

## 2015-03-22 NOTE — Discharge Summary (Signed)
Physician Discharge Summary  Patient ID: [name] [name] MRN: [id number] DOB/AGE: [birth year] 55 y.o.  Admit date: 03/15/2015 Discharge date: 03/18/2015   Procedures:  Procedure(s) (LRB): IRRIGATION AND DEBRIDEMENT RIGHT HIP WITH  HEAD BALL  AND POLY LINER EXCHANGE (Right)  Attending Physician:  Dr. Paralee Cancel   Admission Diagnoses:   Infected right total hip arthroplasty   Discharge Diagnoses:  Active Problems:   Prosthetic hip infection [facility])  Past Medical History  Diagnosis Date  . Radiation   . HIV (human immunodeficiency virus infection) ([place])   . Shingles     left shoulder, right leg  . Pain in limb 07/23/2013  . 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 H/O hypotension   . Arthritis   . History of gout   . Hx of sepsis 2015    affected rt hip / femur  . Exposure to hepatitis B   . 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 History of transfusion   . Decubitus ulcer of sacral area     HPI:    Pt is a 55 y.o. male complaining of right hip pain for about 1 weeks. Pain had continually increased since the beginning. He was on a trip and his hip started hurting. He did follow up with a doctor who thought he had some fluid on his hip. Upon presentation to the clinic there was a pocket of fluid under the skin. This area was aspirated and sent for evaluation. Dr. [name] reviewed the results, called the patient ans had the present to the hospital to have and I&D of the right hip. Various options are discussed with the patient. Risks, benefits and expectations were discussed with the patient. Patient understand the risks, benefits and expectations and wishes to proceed with surgery.   PCP: [name], MD   Discharged Condition: good  Hospital Course:  Patient underwent the above stated procedure on 03/15/2015. Patient tolerated the procedure well and brought to the recovery room in good condition and subsequently to the floor.  POD #1 BP: 84/57 ; Pulse: 60  ; Temp: 98.1 F (36.7 C) ; Resp: 20 Patient reports pain as mild to moderate but better than pre-operative state. Still complains of left hip pain as well. Neurovascular intact and incision: dressing C/D/I. No drain used at time of OR.  LABS  Basename    HGB     9.0  HCT     27.8   POD #2  BP: 115/65 ; Pulse: 57 ; Temp: 97.8 F (36.6 C) ; Resp: 19 Patient reports pain as mild, pain controlled. No events throughout the night. Still c/o so left hip pain, do to his hx on the right hip we will obtain a MRI to evaluate the left hip. Dorsiflexion/Plantar flexion intact and incision: dressing C/D/I  LABS  Basename    HGB     8.0  HCT     24.9   POD #3  BP: 112/75 ; Pulse: 58 ; Temp: 97.8 F (36.6 C) ; Resp: 16 Patient reports pain as mild, pain controlled. No events throughout the night. Questions encouraged and answered. Ready to be discharged home after getting his antibiotic dose in the hospital. Dorsiflexion/plantar flexion intact, incision: dressing C/D/I, no cellulitis present and compartment soft.   LABS   No new labs   Discharge Exam: General appearance: alert, cooperative and no distress Extremities: Homans sign is negative, no sign of DVT, no  edema, redness or tenderness in the calves or thighs and no ulcers, gangrene or trophic changes  Disposition: Home with follow up in 2 weeks   Follow-up Information    Follow up with [facility].   Why:  IV antibiotics   Contact information:   [address] [place] [address] [phone]       Follow up with [facility].   Why:  Home health physical therapy and nursing for IV antibiotic administration and PICC line maintenance   Contact information:   [address] [place] [address] [phone]       Follow up with [name], MD. Schedule an appointment as soon as possible for a visit in 2 weeks.   Specialty:  Orthopedic Surgery   Contact information:    [address] [place] [address] [id number]       Discharge Instructions    Call MD / Call 911    Complete by:  As directed   If you experience chest pain or shortness of breath, CALL 911 and be transported to the hospital emergency room.  If you develope a fever above 101 F, pus (white drainage) or increased drainage or redness at the wound, or calf pain, call your surgeon's office.     Change dressing    Complete by:  As directed   Maintain surgical dressing until follow up in the clinic. If the edges start to pull up, may reinforce with tape. If the dressing is no longer working, may remove and cover with gauze and tape, but must keep the area dry and clean.  Call with any questions or concerns.     Constipation Prevention    Complete by:  As directed   Drink plenty of fluids.  Prune juice may be helpful.  You may use a stool softener, such as Colace (over the counter) 100 mg twice a day.  Use MiraLax (over the counter) for constipation as needed.     Diet - low sodium heart healthy    Complete by:  As directed      Discharge instructions    Complete by:  As directed   Maintain surgical dressing until follow up in the clinic. If the edges start to pull up, may reinforce with tape. If the dressing is no longer working, may remove and cover with gauze and tape, but must keep the area dry and clean.  Follow up in 2 weeks at [facility]. Call with any questions or concerns.     Increase activity slowly as tolerated    Complete by:  As directed   Weight bearing as tolerated with assist device (walker, cane, etc) as directed, use it as long as suggested by your surgeon or therapist, typically at least 4-6 weeks.     TED hose    Complete by:  As directed   Use stockings (TED hose) for 2 weeks on both leg(s).  You may remove them at night for sleeping.             Medication List    STOP taking these medications        ibuprofen 200 MG tablet    Commonly known as:  ADVIL,MOTRIN     traMADol 50 MG tablet  Commonly known as:  ULTRAM      TAKE these medications        aspirin 325 MG EC tablet  Take 1 tablet (325 mg  total) by mouth 2 (two) times daily.     cefTRIAXone 2 g in dextrose 5 % 50 mL  Inject 2 g into the vein daily.     darunavir-cobicistat 800-150 MG tablet  Commonly known as:  PREZCOBIX  Take 1 tablet by mouth daily. Swallow whole. Do NOT crush, break or chew tablets. Take with food.     docusate sodium 100 MG capsule  Commonly known as:  COLACE  Take 1 capsule (100 mg total) by mouth 2 (two) times daily.     dolutegravir 50 MG tablet  Commonly known as:  TIVICAY  Take 1 tablet (50 mg total) by mouth daily.     doxycycline 100 MG capsule  Commonly known as:  MONODOX  Take 100 mg by mouth 2 (two) times daily. 03/11/15-04/07/15 #60 for 30days     feeding supplement (ENSURE COMPLETE) Liqd  Take 237 mLs by mouth 3 (three) times daily as needed (nutrtional supplement). Reported on 03/10/2015     ferrous sulfate 325 (65 FE) MG tablet  Take 1 tablet (325 mg total) by mouth 3 (three) times daily after meals.     HYDROcodone-acetaminophen 7.5-325 MG tablet  Commonly known as:  NORCO  Take 1 tablet by mouth every 6 (six) hours as needed for moderate pain.     methocarbamol 500 MG tablet  Commonly known as:  ROBAXIN  Take 1 tablet (500 mg total) by mouth every 6 (six) hours as needed for muscle spasms.     metoCLOPramide 10 MG tablet  Commonly known as:  REGLAN  Take 1 tablet (10 mg total) by mouth every 8 (eight) hours as needed (hiccups).     polyethylene glycol packet  Commonly known as:  MIRALAX / GLYCOLAX  Take 17 g by mouth 2 (two) times daily.     potassium chloride SA 20 MEQ tablet  Commonly known as:  K-DUR,KLOR-CON  Take 2 tablets (40 mEq total) by mouth once.     rilpivirine 25 MG Tabs tablet  Commonly known as:  EDURANT  Take 1 tablet (25 mg total) by mouth daily with breakfast.     vitamin C  500 MG tablet  Commonly known as:  ASCORBIC ACID  Take 1,000 mg by mouth daily.         Signed: [place] P[place]. Zachari Alberta   PA-C  03/22/2015, 4:44 PM

## 2015-03-24 ENCOUNTER — Telehealth: Payer: Self-pay

## 2015-03-24 NOTE — Telephone Encounter (Signed)
[name] [name] and spoke to Coretta to give [name] V.O. to repeat potassium levels.

## 2015-03-26 ENCOUNTER — Telehealth: Payer: Self-pay | Admitting: *Deleted

## 2015-03-26 DIAGNOSIS — B20 Human immunodeficiency virus [HIV] disease: Secondary | ICD-10-CM

## 2015-03-26 MED ORDER — DOLUTEGRAVIR SODIUM 50 MG PO TABS: 50.0000 mg | ORAL_TABLET | Freq: Every day | ORAL | Status: DC

## 2015-03-26 MED ORDER — DARUNAVIR-COBICISTAT 800-150 MG PO TABS: 1.0000 | ORAL_TABLET | Freq: Every day | ORAL | Status: DC

## 2015-03-26 MED ORDER — RILPIVIRINE HCL 25 MG PO TABS: 25.0000 mg | ORAL_TABLET | Freq: Every day | ORAL | Status: DC

## 2015-03-26 NOTE — Telephone Encounter (Signed)
PA completed for Tivicay and Prezcobix and sent.  Received response.  The patient must use a specialty mail order pharmacy, [facility].  Pt to set up account with [facility] for HIV medications.  RN sending medications to [facility].

## 2015-03-26 NOTE — Telephone Encounter (Signed)
Needing shipment to arrive by Monday.  [facility] will ship to arrive by Monday.

## 2015-04-02 ENCOUNTER — Encounter: Payer: Self-pay | Admitting: Infectious Diseases

## 2015-04-06 ENCOUNTER — Other Ambulatory Visit: Payer: Self-pay | Admitting: Surgery

## 2015-04-06 ENCOUNTER — Ambulatory Visit (HOSPITAL_COMMUNITY)
Admission: RE | Admit: 2015-04-06 | Discharge: 2015-04-06 | Disposition: A | Discharge status: Home / Self Care | Payer: BLUE CROSS/BLUE SHIELD | Source: Ambulatory Visit | Attending: Surgery | Admitting: Surgery

## 2015-04-06 ENCOUNTER — Encounter (HOSPITAL_BASED_OUTPATIENT_CLINIC_OR_DEPARTMENT_OTHER): Payer: BLUE CROSS/BLUE SHIELD | Attending: Surgery

## 2015-04-06 DIAGNOSIS — Z87891 Personal history of nicotine dependence: Secondary | ICD-10-CM | POA: Diagnosis not present

## 2015-04-06 DIAGNOSIS — B20 Human immunodeficiency virus [HIV] disease: Secondary | ICD-10-CM | POA: Insufficient documentation

## 2015-04-06 DIAGNOSIS — L89153 Pressure ulcer of sacral region, stage 3: Secondary | ICD-10-CM | POA: Insufficient documentation

## 2015-04-06 DIAGNOSIS — Z9221 Personal history of antineoplastic chemotherapy: Secondary | ICD-10-CM | POA: Insufficient documentation

## 2015-04-06 DIAGNOSIS — M869 Osteomyelitis, unspecified: Secondary | ICD-10-CM

## 2015-04-06 DIAGNOSIS — Z923 Personal history of irradiation: Secondary | ICD-10-CM | POA: Insufficient documentation

## 2015-04-06 DIAGNOSIS — Z85048 Personal history of other malignant neoplasm of rectum, rectosigmoid junction, and anus: Secondary | ICD-10-CM | POA: Insufficient documentation

## 2015-04-06 DIAGNOSIS — Z86718 Personal history of other venous thrombosis and embolism: Secondary | ICD-10-CM | POA: Diagnosis not present

## 2015-04-12 ENCOUNTER — Encounter: Payer: Self-pay | Admitting: Infectious Diseases

## 2015-04-13 ENCOUNTER — Ambulatory Visit (INDEPENDENT_AMBULATORY_CARE_PROVIDER_SITE_OTHER): Payer: BLUE CROSS/BLUE SHIELD | Admitting: Infectious Diseases

## 2015-04-13 ENCOUNTER — Encounter (HOSPITAL_BASED_OUTPATIENT_CLINIC_OR_DEPARTMENT_OTHER): Payer: BLUE CROSS/BLUE SHIELD | Attending: Surgery

## 2015-04-13 ENCOUNTER — Encounter: Payer: Self-pay | Admitting: Infectious Diseases

## 2015-04-13 ENCOUNTER — Other Ambulatory Visit (HOSPITAL_COMMUNITY): Payer: Self-pay | Admitting: Surgery

## 2015-04-13 VITALS — BP 116/74 | HR 88 | Temp 98.5°F | Wt 139.0 lb

## 2015-04-13 DIAGNOSIS — B20 Human immunodeficiency virus [HIV] disease: Secondary | ICD-10-CM

## 2015-04-13 DIAGNOSIS — Z86718 Personal history of other venous thrombosis and embolism: Secondary | ICD-10-CM | POA: Diagnosis not present

## 2015-04-13 DIAGNOSIS — Z85048 Personal history of other malignant neoplasm of rectum, rectosigmoid junction, and anus: Secondary | ICD-10-CM | POA: Diagnosis not present

## 2015-04-13 DIAGNOSIS — L89153 Pressure ulcer of sacral region, stage 3: Secondary | ICD-10-CM | POA: Insufficient documentation

## 2015-04-13 DIAGNOSIS — C210 Malignant neoplasm of anus, unspecified: Secondary | ICD-10-CM | POA: Diagnosis not present

## 2015-04-13 DIAGNOSIS — Z923 Personal history of irradiation: Secondary | ICD-10-CM | POA: Insufficient documentation

## 2015-04-13 DIAGNOSIS — M00051 Staphylococcal arthritis, right hip: Secondary | ICD-10-CM | POA: Diagnosis not present

## 2015-04-13 DIAGNOSIS — Z09 Encounter for follow-up examination after completed treatment for conditions other than malignant neoplasm: Secondary | ICD-10-CM

## 2015-04-13 DIAGNOSIS — Z9221 Personal history of antineoplastic chemotherapy: Secondary | ICD-10-CM | POA: Insufficient documentation

## 2015-04-13 DIAGNOSIS — L89152 Pressure ulcer of sacral region, stage 2: Secondary | ICD-10-CM

## 2015-04-13 DIAGNOSIS — K029 Dental caries, unspecified: Secondary | ICD-10-CM | POA: Diagnosis not present

## 2015-04-13 NOTE — Assessment & Plan Note (Signed)
He has 2 more weeks of ceftriaxone. Will complete this as planned.  His wound is doing well.  He has f/u with Dr [name].

## 2015-04-13 NOTE — Progress Notes (Signed)
   Subjective:    Patient ID: [name] [name], male    DOB: 1960/06/10, 55 y.o.   MRN: [id number]  HPI 55 yo M with HIV/AIDS, and rectal CA (2008). Has been doing well. No problems with meds- Prezcobix/RPV/DTGV qday. He has previously been on: ATVr/TFV/CBV, EFV, KLT.  Has been ETOH and tobacco free for > 1 year.  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 He was septic, requiring dopamine initially. He underwent resection of his R hip and debridement of his decubitus ulcer. His Cx grew group B Strep. He was d/c to [facility] to complete 6 weeks of ceftriaxone.  He underwent R THR on 06-15-14. Was placed on doxy post-op.  TURP 01-2014. Has f/u appt Jun 08, 2015.  He was seen in ID clinic on 3-1 and complained of swelling over his L hip. He thought this was related to how he had slept on it. He was also noted to have fluctuance over his R hip. He was seen by ortho on 3-2 and had R THR joint aspirate. He had molecular test (synovasure) done on fluid which revealed streptococci. He was adm to hospital on 3-6 and underwent I & D and poly-exchange. He was started on ceftriaxone post-op with plan to continue for 1 month.He was d/c home 3-9.  Has been feeling well. Has had f/u at [facility]. May gert HBO therapy for recurrence of his decubitus. Hip wound is healing well.  No problems with PIC or atbx.   HIV 1 RNA QUANT (copies/mL)  Date Value  02/24/2015 97*  09/10/2014 97*  06/10/2014 1762*   CD4 T CELL ABS (/uL)  Date Value  02/24/2015 250*  09/10/2014 240*  06/10/2014 360*   Has missed 5 doses of meds (DRVc then Crystal) last month due to insurance issues.   Review of Systems  Constitutional: Negative for fever, chills, appetite change and unexpected weight change.  Gastrointestinal: Negative for diarrhea and constipation.  Genitourinary: Negative for difficulty urinating.       Objective:   Physical Exam  Constitutional: He appears well-developed and  well-nourished.  HENT:  Mouth/Throat: No oropharyngeal exudate.  Eyes: EOM are normal. Pupils are equal, round, and reactive to light.  Neck: Neck supple.  Cardiovascular: Normal rate, regular rhythm and normal heart sounds.   Pulmonary/Chest: Effort normal and breath sounds normal.  Abdominal: Soft. Bowel sounds are normal. There is no tenderness. There is no rebound.  Musculoskeletal:       Arms:      Legs: Lymphadenopathy:    He has no cervical adenopathy.          Assessment & Plan:

## 2015-04-13 NOTE — Assessment & Plan Note (Signed)
Offered to have him seen by dental. He will f/u when his other issues have improved.

## 2015-04-13 NOTE — Assessment & Plan Note (Signed)
He appears to be doing well.  greattly appreciate W[facility] f/u

## 2015-04-13 NOTE — Assessment & Plan Note (Signed)
He will f/u with Dr Benay Spice.

## 2015-04-13 NOTE — Assessment & Plan Note (Signed)
He is doing well on a very complicated regimen.  I offered to have him seen at [facility]. He wants to try his current plan as is.  Will see him back in 3-4 months.

## 2015-04-14 ENCOUNTER — Ambulatory Visit (HOSPITAL_COMMUNITY)
Admission: RE | Admit: 2015-04-14 | Discharge: 2015-04-14 | Disposition: A | Discharge status: Home / Self Care | Payer: BLUE CROSS/BLUE SHIELD | Source: Ambulatory Visit | Attending: Surgery | Admitting: Surgery

## 2015-04-14 ENCOUNTER — Other Ambulatory Visit: Payer: Self-pay | Admitting: Surgery

## 2015-04-14 DIAGNOSIS — I4510 Unspecified right bundle-branch block: Secondary | ICD-10-CM | POA: Insufficient documentation

## 2015-04-14 DIAGNOSIS — Z01818 Encounter for other preprocedural examination: Secondary | ICD-10-CM | POA: Insufficient documentation

## 2015-04-14 DIAGNOSIS — Z0000 Encounter for general adult medical examination without abnormal findings: Secondary | ICD-10-CM

## 2015-04-19 ENCOUNTER — Telehealth: Payer: Self-pay | Admitting: *Deleted

## 2015-04-19 DIAGNOSIS — L89153 Pressure ulcer of sacral region, stage 3: Secondary | ICD-10-CM | POA: Diagnosis not present

## 2015-04-19 NOTE — Telephone Encounter (Signed)
Debbie at [facility] notified IV antibiotics to be extended for two more weeks from 04/13/15 per Dr. Megan Salon. Myrtis Hopping CMA

## 2015-04-19 NOTE — Telephone Encounter (Signed)
Please give an order to extend ceftriaxone until 04/27/2015 as indicated in Dr. Algis Downs note on 04/13/2015.

## 2015-04-19 NOTE — Telephone Encounter (Signed)
Call from patient's skilled nursing facility for an order to pull picc. Per Dr. Algis Downs last office note on 04/13/15 the ceftriaxone should be extended two more weeks. They have an order that the antibiotic has ended. The nurse asked if [name] was going to call to extend. I spoke to Janyce Llanos, [place] and she is unaware of an extend. Spoke to patient, Eaden and he said that Dr. [name] did the surgery and the antibiotic order was from Dr. [name]. Advised him to call Dr. Aurea Graff office and if his SNF needs an order from [facility], the nurse should call back for the covering MDs pager #. Myrtis Hopping

## 2015-04-20 DIAGNOSIS — L89153 Pressure ulcer of sacral region, stage 3: Secondary | ICD-10-CM | POA: Diagnosis not present

## 2015-04-21 DIAGNOSIS — L89153 Pressure ulcer of sacral region, stage 3: Secondary | ICD-10-CM | POA: Diagnosis not present

## 2015-04-22 DIAGNOSIS — L89153 Pressure ulcer of sacral region, stage 3: Secondary | ICD-10-CM | POA: Diagnosis not present

## 2015-04-23 DIAGNOSIS — L89153 Pressure ulcer of sacral region, stage 3: Secondary | ICD-10-CM | POA: Diagnosis not present

## 2015-04-26 DIAGNOSIS — L89153 Pressure ulcer of sacral region, stage 3: Secondary | ICD-10-CM | POA: Diagnosis not present

## 2015-04-27 DIAGNOSIS — L89153 Pressure ulcer of sacral region, stage 3: Secondary | ICD-10-CM | POA: Diagnosis not present

## 2015-04-28 DIAGNOSIS — L89153 Pressure ulcer of sacral region, stage 3: Secondary | ICD-10-CM | POA: Diagnosis not present

## 2015-04-29 ENCOUNTER — Telehealth: Payer: Self-pay | Admitting: Oncology

## 2015-04-29 ENCOUNTER — Telehealth: Payer: Self-pay | Admitting: Pharmacist Clinician (PhC)/ Clinical Pharmacy Specialist

## 2015-04-29 ENCOUNTER — Ambulatory Visit (HOSPITAL_BASED_OUTPATIENT_CLINIC_OR_DEPARTMENT_OTHER): Payer: BLUE CROSS/BLUE SHIELD | Admitting: Nurse Practitioner

## 2015-04-29 ENCOUNTER — Telehealth: Payer: Self-pay | Admitting: *Deleted

## 2015-04-29 VITALS — BP 129/69 | HR 65 | Temp 97.8°F | Resp 18 | Ht 74.0 in | Wt 142.6 lb

## 2015-04-29 DIAGNOSIS — Z85048 Personal history of other malignant neoplasm of rectum, rectosigmoid junction, and anus: Secondary | ICD-10-CM | POA: Diagnosis not present

## 2015-04-29 DIAGNOSIS — L89153 Pressure ulcer of sacral region, stage 3: Secondary | ICD-10-CM | POA: Diagnosis not present

## 2015-04-29 DIAGNOSIS — C210 Malignant neoplasm of anus, unspecified: Secondary | ICD-10-CM

## 2015-04-29 NOTE — Telephone Encounter (Signed)
Pt was started on new rx by Dr. [name] that may interact with Prezcobix.  Pt did not leave the name of medication.  Requested that pt return call to let us know new rx name.

## 2015-04-29 NOTE — Telephone Encounter (Signed)
[name] [name] called with question about interactions with the new prescribed abx. Apparently, he just finished a course of cetriaxone for the septic hip. He saw Dr. [name] yesterday and was prescribed doxy and rifampin. He has not started yet. Rifampin will be a huge issue here with his ART. Not sure why of the reason since there is no note in chart. He is going to call his office tomorrow and see if Dr. [name] can call here.

## 2015-04-29 NOTE — Telephone Encounter (Signed)
Gave pt appt & avs °

## 2015-04-29 NOTE — Progress Notes (Addendum)
[facility] OFFICE PROGRESS NOTE   Diagnosis:  Anal cancer  INTERVAL HISTORY:   [name] was last seen at the [facility] in August 2014. He did not return for subsequent follow-up. He was hospitalized in July 2015 with a right iliopsoas abscess and septic right hip. He underwent resection of the right hip and debridement of a decubitus ulcer. 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 He was hospitalized 03/15/2015 with a right hip infection. He underwent irrigation and debridement of the right hip with hip and poly liner exchange. He was discharged home 03/18/2015.  He reports he is currently receiving hyperbaric therapy. He completed a course of IV antibiotics yesterday. PICC line was removed this morning. He continues doxycycline. Bowels moving regularly. Stools are "less flat". He occasionally notes blood with wiping.  Objective:  Vital signs in last 24 hours:  Blood pressure 129/69, pulse 65, temperature 97.8 F (36.6 C), temperature source Oral, resp. rate 18, height 6\' 2"  (1.88 m), weight 142 lb 9.6 oz (64.683 kg), SpO2 100 %.    HEENT: No thrush or ulcers. Lymphatics: No palpable cervical, supra clavicular, axillary or inguinal lymph nodes. Resp: Lungs clear bilaterally. Cardio: Regular rate and rhythm. GI: Abdomen soft and nontender. No hepatomegaly. Vascular: No leg edema. Skin: Persistent extensive fibrotic changes at the perineum, groin and pubic region. No nodularity. Examination of the anus limited due to fibrosis. Bandaged decubitus ulcer at the sacrum.   Lab Results:  Lab Results  Component Value Date   WBC 14.1* 03/17/2015   HGB 8.0* 03/17/2015   HCT 24.9* 03/17/2015   MCV 96.5 03/17/2015   PLT 326 03/17/2015   NEUTROABS 7.6 06/22/2014     Medications: I have reviewed the patient's current medications.  Assessment/Plan: 1. Anal cancer diagnosed in April 2008 status post concurrent chemotherapy and radiation. He is  status post a negative perianal skin biopsy, November 27, 2006. 2. Status post bilateral inguinal hernia repair, January 08, 2007. 3. Hematoma following the inguinal hernia repair status post evacuation of the hematoma, January 15, 2007. The right lower quadrant wound healed by secondary intention, and there is a remaining wound defect in the right lower abdomen. 4. History of left lower extremity deep venous thrombosis. He is maintained off of Coumadin. 5. History of thrombocytopenia secondary to chemotherapy, radiation, polypharmacy and human immunodeficiency virus infection. 6. History of anemia secondary to surgery, the right groin hematoma and human immunodeficiency virus infection. 7. Human immunodeficiency virus infection followed by [name]. 8. History of oral candidiasis. He no longer takes Diflucan. 9. History of anorexia/weight loss. Stable today.  10. History of hematuria, status post a cystoscopy revealing urethral stricture, status post a dilatation procedure by Dr. Karsten Ro. 11. Extensive perineal/groin radiation fibrosis and scarring. No clinical evidence for local recurrence of cancer. 12. Hospitalized in July 2015 with a right iliopsoas abscess and septic right hip. He underwent resection of the right hip and debridement of a decubitus ulcer. 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 78. Hospitalized 03/15/2015 with right hip infection status post irrigation and debridement; completed IV antibiotics 04/28/2015.   Disposition: [name] remains in clinical remission from anal cancer. He is now 9 years out from diagnosis. We scheduled a return visit in one year. He will contact the office in the interim with any problems.  Patient seen with Dr. Benay Spice.    Ned Card ANP/GNP-BC   04/29/2015  11:18 AM   This was a shared visit  with Ned Card. Mr. Laconte remains in clinical remission from anal cancer.   Julieanne Manson, M.D.

## 2015-04-29 NOTE — Telephone Encounter (Signed)
Dr. [name] gave the pt an rx for Rifampin.  Phone call transferred to [facility]. Devin Going, pharmacist to discuss.

## 2015-04-30 DIAGNOSIS — L89153 Pressure ulcer of sacral region, stage 3: Secondary | ICD-10-CM | POA: Diagnosis not present

## 2015-05-03 DIAGNOSIS — L89153 Pressure ulcer of sacral region, stage 3: Secondary | ICD-10-CM | POA: Diagnosis not present

## 2015-05-03 NOTE — Telephone Encounter (Signed)
Pt called Dr Aurea Graff office and asked for Dr. [name] to call [facility].  RN spoke with Jene Every, pharmacist.  Jerilynn Mages. Pham also sent Dr. [name] a message to call [facility] to discuss rx treatment options.

## 2015-05-04 DIAGNOSIS — L89153 Pressure ulcer of sacral region, stage 3: Secondary | ICD-10-CM | POA: Diagnosis not present

## 2015-05-05 DIAGNOSIS — L89153 Pressure ulcer of sacral region, stage 3: Secondary | ICD-10-CM | POA: Diagnosis not present

## 2015-05-06 DIAGNOSIS — L89153 Pressure ulcer of sacral region, stage 3: Secondary | ICD-10-CM | POA: Diagnosis not present

## 2015-05-07 DIAGNOSIS — L89153 Pressure ulcer of sacral region, stage 3: Secondary | ICD-10-CM | POA: Diagnosis not present

## 2015-05-10 ENCOUNTER — Encounter: Payer: Self-pay | Admitting: Infectious Diseases

## 2015-05-10 ENCOUNTER — Encounter (HOSPITAL_BASED_OUTPATIENT_CLINIC_OR_DEPARTMENT_OTHER): Payer: BLUE CROSS/BLUE SHIELD | Attending: Surgery

## 2015-05-10 DIAGNOSIS — B20 Human immunodeficiency virus [HIV] disease: Secondary | ICD-10-CM | POA: Insufficient documentation

## 2015-05-10 DIAGNOSIS — Z86718 Personal history of other venous thrombosis and embolism: Secondary | ICD-10-CM | POA: Insufficient documentation

## 2015-05-10 DIAGNOSIS — T66XXXA Radiation sickness, unspecified, initial encounter: Secondary | ICD-10-CM | POA: Insufficient documentation

## 2015-05-10 DIAGNOSIS — L89150 Pressure ulcer of sacral region, unstageable: Secondary | ICD-10-CM | POA: Insufficient documentation

## 2015-05-10 DIAGNOSIS — Z9221 Personal history of antineoplastic chemotherapy: Secondary | ICD-10-CM | POA: Insufficient documentation

## 2015-05-11 DIAGNOSIS — L89150 Pressure ulcer of sacral region, unstageable: Secondary | ICD-10-CM | POA: Diagnosis not present

## 2015-05-11 NOTE — Telephone Encounter (Signed)
i would keep him on doxy 100mg  bid thanks

## 2015-05-11 NOTE — Telephone Encounter (Signed)
Patient called and advised no one has called him about medication. He advised Dr [name] office was to call Dr [name] to discuss the change in antibiotics. He has 3 days left on his Doxy. He advised he was going to be changed to something that was contraindicated with his HIV medication and Dr [name] did not feel comfortable making the change and wants to defer to Dr [name]. [place] office says they have spoken to Dr [name]. Told patient I will have to check with the doctor and give him a call back.

## 2015-05-12 DIAGNOSIS — L89150 Pressure ulcer of sacral region, unstageable: Secondary | ICD-10-CM | POA: Diagnosis not present

## 2015-05-12 NOTE — Telephone Encounter (Signed)
Will send doxycycline 100 mg twice daily, but need number of refills please. Landis Gandy, RN

## 2015-05-12 NOTE — Telephone Encounter (Signed)
12 refills thanks

## 2015-05-13 ENCOUNTER — Other Ambulatory Visit: Payer: Self-pay | Admitting: *Deleted

## 2015-05-13 DIAGNOSIS — Z96649 Presence of unspecified artificial hip joint: Principal | ICD-10-CM

## 2015-05-13 DIAGNOSIS — T8459XD Infection and inflammatory reaction due to other internal joint prosthesis, subsequent encounter: Secondary | ICD-10-CM

## 2015-05-13 DIAGNOSIS — L89150 Pressure ulcer of sacral region, unstageable: Secondary | ICD-10-CM | POA: Diagnosis not present

## 2015-05-13 MED ORDER — DOXYCYCLINE MONOHYDRATE 100 MG PO CAPS: 100.0000 mg | ORAL_CAPSULE | Freq: Two times a day (BID) | ORAL | Status: DC

## 2015-05-13 NOTE — Telephone Encounter (Signed)
rx sent, patient notified. Thanks!

## 2015-05-14 ENCOUNTER — Encounter: Payer: Self-pay | Admitting: Infectious Diseases

## 2015-05-14 DIAGNOSIS — L89150 Pressure ulcer of sacral region, unstageable: Secondary | ICD-10-CM | POA: Diagnosis not present

## 2015-05-17 DIAGNOSIS — L89150 Pressure ulcer of sacral region, unstageable: Secondary | ICD-10-CM | POA: Diagnosis not present

## 2015-05-18 DIAGNOSIS — L89150 Pressure ulcer of sacral region, unstageable: Secondary | ICD-10-CM | POA: Diagnosis not present

## 2015-05-19 DIAGNOSIS — L89150 Pressure ulcer of sacral region, unstageable: Secondary | ICD-10-CM | POA: Diagnosis not present

## 2015-05-20 DIAGNOSIS — L89150 Pressure ulcer of sacral region, unstageable: Secondary | ICD-10-CM | POA: Diagnosis not present

## 2015-05-21 DIAGNOSIS — L89150 Pressure ulcer of sacral region, unstageable: Secondary | ICD-10-CM | POA: Diagnosis not present

## 2015-05-25 DIAGNOSIS — L89150 Pressure ulcer of sacral region, unstageable: Secondary | ICD-10-CM | POA: Diagnosis not present

## 2015-05-27 DIAGNOSIS — L89150 Pressure ulcer of sacral region, unstageable: Secondary | ICD-10-CM | POA: Diagnosis not present

## 2015-05-28 DIAGNOSIS — L89150 Pressure ulcer of sacral region, unstageable: Secondary | ICD-10-CM | POA: Diagnosis not present

## 2015-05-31 DIAGNOSIS — L89150 Pressure ulcer of sacral region, unstageable: Secondary | ICD-10-CM | POA: Diagnosis not present

## 2015-06-01 DIAGNOSIS — L89150 Pressure ulcer of sacral region, unstageable: Secondary | ICD-10-CM | POA: Diagnosis not present

## 2015-06-02 DIAGNOSIS — L89150 Pressure ulcer of sacral region, unstageable: Secondary | ICD-10-CM | POA: Diagnosis not present

## 2015-06-15 ENCOUNTER — Encounter (HOSPITAL_BASED_OUTPATIENT_CLINIC_OR_DEPARTMENT_OTHER): Payer: BLUE CROSS/BLUE SHIELD | Attending: Surgery

## 2015-06-15 DIAGNOSIS — L89152 Pressure ulcer of sacral region, stage 2: Secondary | ICD-10-CM | POA: Diagnosis present

## 2015-06-15 DIAGNOSIS — Z923 Personal history of irradiation: Secondary | ICD-10-CM | POA: Insufficient documentation

## 2015-06-15 DIAGNOSIS — Z9221 Personal history of antineoplastic chemotherapy: Secondary | ICD-10-CM | POA: Diagnosis not present

## 2015-06-15 DIAGNOSIS — B20 Human immunodeficiency virus [HIV] disease: Secondary | ICD-10-CM | POA: Diagnosis not present

## 2015-06-15 DIAGNOSIS — Z86718 Personal history of other venous thrombosis and embolism: Secondary | ICD-10-CM | POA: Diagnosis not present

## 2015-06-15 DIAGNOSIS — Z87891 Personal history of nicotine dependence: Secondary | ICD-10-CM | POA: Diagnosis not present

## 2015-06-22 DIAGNOSIS — L89152 Pressure ulcer of sacral region, stage 2: Secondary | ICD-10-CM | POA: Diagnosis not present

## 2015-06-23 ENCOUNTER — Other Ambulatory Visit: Payer: BLUE CROSS/BLUE SHIELD

## 2015-06-23 DIAGNOSIS — B20 Human immunodeficiency virus [HIV] disease: Secondary | ICD-10-CM

## 2015-06-23 LAB — COMPLETE METABOLIC PANEL WITH GFR
ALT: 10 U/L (ref 9–46)
AST: 15 U/L (ref 10–35)
Albumin: 4.1 g/dL (ref 3.6–5.1)
Alkaline Phosphatase: 75 U/L (ref 40–115)
BILIRUBIN TOTAL: 0.5 mg/dL (ref 0.2–1.2)
BUN: 18 mg/dL (ref 7–25)
CHLORIDE: 106 mmol/L (ref 98–110)
CO2: 25 mmol/L (ref 20–31)
Calcium: 9.2 mg/dL (ref 8.6–10.3)
Creat: 1.18 mg/dL (ref 0.70–1.33)
GFR, EST AFRICAN AMERICAN: 81 mL/min (ref >=60)
GFR, EST NON AFRICAN AMERICAN: 70 mL/min (ref >=60)
GLUCOSE: 114 mg/dL — AB (ref 65–99)
POTASSIUM: 4.2 mmol/L (ref 3.5–5.3)
SODIUM: 140 mmol/L (ref 135–146)
TOTAL PROTEIN: 7 g/dL (ref 6.1–8.1)

## 2015-06-23 LAB — CBC
HCT: 38.3 % — ABNORMAL LOW (ref 38.5–50.0)
Hemoglobin: 12.3 g/dL — ABNORMAL LOW (ref 13.2–17.1)
MCH: 29.9 pg (ref 27.0–33.0)
MCHC: 32.1 g/dL (ref 32.0–36.0)
MCV: 93 fL (ref 80.0–100.0)
MPV: 9.1 fL (ref 7.5–12.5)
PLATELETS: 182 10*3/uL (ref 140–400)
RBC: 4.12 MIL/uL — AB (ref 4.20–5.80)
RDW: 15.4 % — AB (ref 11.0–15.0)
WBC: 5.9 10*3/uL (ref 3.8–10.8)

## 2015-06-24 LAB — T-HELPER CELL (CD4) - (RCID CLINIC ONLY)
CD4 % Helper T Cell: 16 % — ABNORMAL LOW (ref 33–55)
CD4 T CELL ABS: 270 /uL — AB (ref 400–2700)

## 2015-06-24 LAB — HIV-1 RNA QUANT-NO REFLEX-BLD
HIV 1 RNA QUANT: 79 {copies}/mL — AB (ref <20)
HIV-1 RNA QUANT, LOG: 1.9 {Log_copies}/mL — AB (ref <1.30)

## 2015-07-07 ENCOUNTER — Ambulatory Visit: Payer: BLUE CROSS/BLUE SHIELD | Admitting: Infectious Diseases

## 2015-07-19 ENCOUNTER — Encounter: Payer: Self-pay | Admitting: Infectious Diseases

## 2015-07-19 ENCOUNTER — Ambulatory Visit (INDEPENDENT_AMBULATORY_CARE_PROVIDER_SITE_OTHER): Payer: BLUE CROSS/BLUE SHIELD | Admitting: Infectious Diseases

## 2015-07-19 VITALS — Ht 74.0 in | Wt 142.0 lb

## 2015-07-19 DIAGNOSIS — Z79899 Other long term (current) drug therapy: Secondary | ICD-10-CM

## 2015-07-19 DIAGNOSIS — K029 Dental caries, unspecified: Secondary | ICD-10-CM

## 2015-07-19 DIAGNOSIS — M00051 Staphylococcal arthritis, right hip: Secondary | ICD-10-CM

## 2015-07-19 DIAGNOSIS — Z113 Encounter for screening for infections with a predominantly sexual mode of transmission: Secondary | ICD-10-CM

## 2015-07-19 DIAGNOSIS — B20 Human immunodeficiency virus [HIV] disease: Secondary | ICD-10-CM | POA: Diagnosis not present

## 2015-07-19 DIAGNOSIS — B081 Molluscum contagiosum: Secondary | ICD-10-CM | POA: Diagnosis not present

## 2015-07-19 MED ORDER — LEVOFLOXACIN 500 MG PO TABS: 500.0000 mg | ORAL_TABLET | Freq: Every day | ORAL | Status: DC

## 2015-07-19 NOTE — Assessment & Plan Note (Signed)
He is doing well Will continue his current deep salvage rx.  Will see him back in 6 months Will have him see derm for basal cell on R face.

## 2015-07-19 NOTE — Progress Notes (Signed)
   Subjective:    Patient ID: [name] [name], male    DOB: August 19, 1960, 55 y.o.   MRN: [id number]  HPI 55 yo M with HIV/AIDS, and rectal CA (2008). Has been doing well. No problems with meds- Prezcobix/RPV/DTGV qday. He has previously been on: ATVr/TFV/CBV, EFV, KLT.  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 He was septic, requiring dopamine initially. He underwent resection of his R hip and debridement of his decubitus ulcer. His Cx grew group B Strep. He was d/c to [facility] to complete 6 weeks of ceftriaxone.  He underwent R THR on 06-15-14. Was placed on doxy post-op.  TURP 01-2014.  He was seen in ID clinic on 03-10-15 and complained of swelling over his L hip. He thought this was related to how he had slept on it. He was also noted to have fluctuance over his R hip. He was seen by ortho on 3-2 and had R THR joint aspirate. He had molecular test (synovasure) done on fluid which revealed streptococci. He was adm to hospital on 3-6 and underwent I & D and poly-exchange. He was started on ceftriaxone post-op with plan to continue for 1 month.He was d/c home 3-9.  He has been continued on doxy since surgery.  Has been back to work, 4h/day, 4d/week.  Has been d/c by ortho. Wound is healed. Complete HBO therapy at end of May. Still using cane for uneven surfaces, otherwise can stand 2-3 h on flat surfaces.  Has been getting collagen on his wound recently without issues.  No problems with ART.  Is on disability, is to go on medicare. Needs help with his co-pay card. Needs Erin.  Has seen Dr Doristine Mango for his rectal CA. Will need f/u with Dr Leonel Ramsay next year.  Has been having weak urine stream, will f/u with Uro.   HIV 1 RNA QUANT (copies/mL)  Date Value  06/23/2015 79*  02/24/2015 97*  09/10/2014 97*   CD4 T CELL ABS (/uL)  Date Value  06/23/2015 270*  02/24/2015 250*  09/10/2014 240*     Review of Systems  Constitutional: Negative for  appetite change and unexpected weight change.  Gastrointestinal: Negative for diarrhea and constipation.  Genitourinary: Negative for difficulty urinating.       Objective:   Physical Exam  Constitutional: He appears well-developed and well-nourished.  HENT:  Mouth/Throat: No oropharyngeal exudate.  Eyes: EOM are normal. Pupils are equal, round, and reactive to light.  Neck: Neck supple.  Cardiovascular: Normal rate, regular rhythm and normal heart sounds.   Pulmonary/Chest: Effort normal and breath sounds normal.  Abdominal: Soft. Bowel sounds are normal. There is no tenderness. There is no rebound.  Musculoskeletal: He exhibits no edema.       Arms: Lymphadenopathy:    He has no cervical adenopathy.       Assessment & Plan:

## 2015-07-19 NOTE — Assessment & Plan Note (Addendum)
Will change him to levaquin Vitamins in AM, anbx in pm Will check his esr and crp at f/u Will continue indefinitely.

## 2015-07-19 NOTE — Assessment & Plan Note (Signed)
Wishes to have them all done at once Wants to defer til January.

## 2015-07-19 NOTE — Assessment & Plan Note (Signed)
Will have him seen by derm, ? Of basal cell on face vs new molluscum lesion

## 2015-09-01 ENCOUNTER — Telehealth: Payer: Self-pay | Admitting: *Deleted

## 2015-09-01 NOTE — Telephone Encounter (Signed)
Contacted patient with intention to see if he has been to a dermatologist in this area.  RN left generic message for patient on the voicemail, as the outgoing message stated this was the voicemail for "Georgana Curio." RN asked patient to call his MD's office regarding a referral.  Of note, patient has [employer] and will not likely need a referral to be able to make an appointment. Landis Gandy, RN

## 2015-09-02 NOTE — Telephone Encounter (Signed)
Patient referred to [facility], Dr. Denna Haggard.  Appointment 9/18 at 2:00. Patient given appointment time, contact into.  Referral faxed to [phone]. Landis Gandy, RN

## 2015-11-28 ENCOUNTER — Other Ambulatory Visit: Payer: Self-pay | Admitting: Infectious Diseases

## 2015-11-28 DIAGNOSIS — B20 Human immunodeficiency virus [HIV] disease: Secondary | ICD-10-CM

## 2015-12-01 ENCOUNTER — Other Ambulatory Visit: Payer: Self-pay | Admitting: *Deleted

## 2015-12-01 DIAGNOSIS — B20 Human immunodeficiency virus [HIV] disease: Secondary | ICD-10-CM

## 2015-12-01 MED ORDER — RILPIVIRINE HCL 25 MG PO TABS: 25.0000 mg | ORAL_TABLET | Freq: Every day | ORAL | 3 refills | Status: DC

## 2016-02-14 ENCOUNTER — Other Ambulatory Visit: Payer: Medicare Other

## 2016-02-14 ENCOUNTER — Ambulatory Visit: Payer: BLUE CROSS/BLUE SHIELD | Admitting: Infectious Diseases

## 2016-02-14 DIAGNOSIS — Z113 Encounter for screening for infections with a predominantly sexual mode of transmission: Secondary | ICD-10-CM

## 2016-02-14 DIAGNOSIS — M00051 Staphylococcal arthritis, right hip: Secondary | ICD-10-CM

## 2016-02-14 DIAGNOSIS — Z79899 Other long term (current) drug therapy: Secondary | ICD-10-CM

## 2016-02-14 DIAGNOSIS — B20 Human immunodeficiency virus [HIV] disease: Secondary | ICD-10-CM

## 2016-02-14 LAB — CBC
HCT: 33.5 % — ABNORMAL LOW (ref 38.5–50.0)
Hemoglobin: 11 g/dL — ABNORMAL LOW (ref 13.2–17.1)
MCH: 32 pg (ref 27.0–33.0)
MCHC: 32.8 g/dL (ref 32.0–36.0)
MCV: 97.4 fL (ref 80.0–100.0)
MPV: 8.3 fL (ref 7.5–12.5)
PLATELETS: 226 10*3/uL (ref 140–400)
RBC: 3.44 MIL/uL — ABNORMAL LOW (ref 4.20–5.80)
RDW: 15.1 % — AB (ref 11.0–15.0)
WBC: 7.5 10*3/uL (ref 3.8–10.8)

## 2016-02-15 LAB — COMPREHENSIVE METABOLIC PANEL
ALK PHOS: 72 U/L (ref 40–115)
ALT: 6 U/L — AB (ref 9–46)
AST: 11 U/L (ref 10–35)
Albumin: 3.4 g/dL — ABNORMAL LOW (ref 3.6–5.1)
BILIRUBIN TOTAL: 0.4 mg/dL (ref 0.2–1.2)
BUN: 10 mg/dL (ref 7–25)
CO2: 26 mmol/L (ref 20–31)
Calcium: 8.6 mg/dL (ref 8.6–10.3)
Chloride: 109 mmol/L (ref 98–110)
Creat: 1.01 mg/dL (ref 0.70–1.33)
GLUCOSE: 95 mg/dL (ref 65–99)
Potassium: 3.4 mmol/L — ABNORMAL LOW (ref 3.5–5.3)
Sodium: 142 mmol/L (ref 135–146)
Total Protein: 5.9 g/dL — ABNORMAL LOW (ref 6.1–8.1)

## 2016-02-15 LAB — LIPID PANEL
CHOL/HDL RATIO: 2.7 ratio (ref <5.0)
Cholesterol: 144 mg/dL (ref <200)
HDL: 53 mg/dL (ref >40)
LDL Cholesterol: 74 mg/dL (ref <100)
Triglycerides: 84 mg/dL (ref <150)
VLDL: 17 mg/dL (ref <30)

## 2016-02-15 LAB — RPR

## 2016-02-15 LAB — SEDIMENTATION RATE: Sed Rate: 40 mm/hr — ABNORMAL HIGH (ref 0–20)

## 2016-02-15 LAB — C-REACTIVE PROTEIN: CRP: 28.2 mg/L — AB (ref <8.0)

## 2016-02-16 LAB — T-HELPER CELL (CD4) - (RCID CLINIC ONLY)
CD4 T CELL HELPER: 15 % — AB (ref 33–55)
CD4 T Cell Abs: 190 /uL — ABNORMAL LOW (ref 400–2700)

## 2016-02-16 LAB — HIV-1 RNA QUANT-NO REFLEX-BLD
HIV 1 RNA QUANT: DETECTED {copies}/mL — AB
HIV-1 RNA QUANT, LOG: DETECTED {Log_copies}/mL — AB

## 2016-02-28 ENCOUNTER — Ambulatory Visit: Payer: Medicare Other | Admitting: Infectious Diseases

## 2016-03-01 ENCOUNTER — Encounter: Payer: Self-pay | Admitting: Infectious Diseases

## 2016-03-01 ENCOUNTER — Ambulatory Visit (INDEPENDENT_AMBULATORY_CARE_PROVIDER_SITE_OTHER): Payer: Medicare Other | Admitting: Infectious Diseases

## 2016-03-01 VITALS — BP 121/76 | HR 62 | Temp 98.0°F | Ht 74.0 in | Wt 138.0 lb

## 2016-03-01 DIAGNOSIS — Z79899 Other long term (current) drug therapy: Secondary | ICD-10-CM | POA: Diagnosis not present

## 2016-03-01 DIAGNOSIS — Z23 Encounter for immunization: Secondary | ICD-10-CM

## 2016-03-01 DIAGNOSIS — K029 Dental caries, unspecified: Secondary | ICD-10-CM

## 2016-03-01 DIAGNOSIS — M00051 Staphylococcal arthritis, right hip: Secondary | ICD-10-CM

## 2016-03-01 DIAGNOSIS — Z113 Encounter for screening for infections with a predominantly sexual mode of transmission: Secondary | ICD-10-CM | POA: Diagnosis not present

## 2016-03-01 DIAGNOSIS — N182 Chronic kidney disease, stage 2 (mild): Secondary | ICD-10-CM

## 2016-03-01 DIAGNOSIS — B20 Human immunodeficiency virus [HIV] disease: Secondary | ICD-10-CM | POA: Diagnosis not present

## 2016-03-01 MED ORDER — CELECOXIB 50 MG PO CAPS: 50.0000 mg | ORAL_CAPSULE | Freq: Two times a day (BID) | ORAL | 1 refills | Status: DC

## 2016-03-01 NOTE — Assessment & Plan Note (Addendum)
Will check his ESR and CRP.  Try to decrease NSAID use, trial of celebryx If his pain persists, get MRI.

## 2016-03-01 NOTE — Assessment & Plan Note (Signed)
Cr improved.  Cont to watch, try to get him off NSAID

## 2016-03-01 NOTE — Assessment & Plan Note (Signed)
Some concern about his drop in CD4. His VL is stable. No change in art Will see him back in 6 months Flu shot today.  Mening shot today.

## 2016-03-01 NOTE — Progress Notes (Signed)
   Subjective:    Patient ID: [name] [name], male    DOB: 1960-08-17, 56 y.o.   MRN: [id number]  HPI 56 yo M with HIV/AIDS, and rectal CA (2008). Has been doing well. No problems with meds- Prezcobix/RPV/DTGV qday. He has previously been on: ATVr/TFV/CBV, EFV, KLT.  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 He was septic, requiring dopamine initially. He underwent resection of his R hip and debridement of his decubitus ulcer. His Cx grew group B Strep. He was d/c to [facility] to complete 6 weeks of ceftriaxone.  He underwent R THR on 06-15-14. Was placed on doxy post-op.  TURP 01-2014.  He was seen in ID clinic on 03-10-15 and complained of swelling over his L hip. He thought this was related to how he had slept on it. He was also noted to have fluctuance over his R hip. He was seen by ortho on 3-2 and had R THR joint aspirate. He had molecular test (synovasure) done on fluid which revealed streptococci. He was adm to hospital on 3-6 and underwent I & D and poly-exchange. He was started on ceftriaxone post-op with plan to continue for 1 month.He was d/c home 03-18-15.  He has been taking levaquin chronically since 07-2015.   Today with concerns of tenderness in both hips. Feels like this is due to how he slept.  No f/c. His ability to bear wt has gotten better but his balance continues to be a problem.  He is getting an appt to see Dr [name]. Last seen in August and was told he was doing well, x-rays clean.  He takes 3-4 ibuprofen when he goes to work and then 3-4 more when he gets home.   Has been eval by Uro for urethral stricture.   HIV 1 RNA Quant (copies/mL)  Date Value  02/14/2016 <20 DETECTED (A)  06/23/2015 79 (H)  02/24/2015 97 (H)   CD4 T Cell Abs (/uL)  Date Value  02/14/2016 190 (L)  06/23/2015 270 (L)  02/24/2015 250 (L)     Review of Systems  Constitutional: Negative for appetite change, chills, fever and unexpected weight  change.  HENT: Positive for dental problem.   Gastrointestinal: Negative for constipation and diarrhea.  Genitourinary: Negative for difficulty urinating.  Musculoskeletal: Positive for arthralgias.  has not been to dentist.      Objective:   Physical Exam  Constitutional: He appears well-developed and well-nourished.  HENT:  Mouth/Throat: Abnormal dentition. No oropharyngeal exudate.  Eyes: EOM are normal. Pupils are equal, round, and reactive to light.  Neck: Neck supple.  Cardiovascular: Normal rate, regular rhythm and normal heart sounds.   Pulmonary/Chest: Effort normal and breath sounds normal.  Abdominal: Soft. Bowel sounds are normal. There is no tenderness. There is no rebound.  Musculoskeletal: He exhibits no edema.       Legs: Lymphadenopathy:    He has no cervical adenopathy.      Assessment & Plan:

## 2016-03-01 NOTE — Assessment & Plan Note (Signed)
Will try to get him into dental clinic.  

## 2016-03-02 LAB — C-REACTIVE PROTEIN: CRP: 18.4 mg/L — ABNORMAL HIGH (ref <8.0)

## 2016-03-02 LAB — SEDIMENTATION RATE: SED RATE: 25 mm/h — AB (ref 0–20)

## 2016-03-30 ENCOUNTER — Other Ambulatory Visit: Payer: Self-pay | Admitting: *Deleted

## 2016-03-30 ENCOUNTER — Telehealth: Payer: Self-pay | Admitting: *Deleted

## 2016-03-30 DIAGNOSIS — B20 Human immunodeficiency virus [HIV] disease: Secondary | ICD-10-CM

## 2016-03-30 MED ORDER — DOLUTEGRAVIR SODIUM 50 MG PO TABS: 50.0000 mg | ORAL_TABLET | Freq: Every day | ORAL | 3 refills | Status: DC

## 2016-03-30 MED ORDER — DARUNAVIR-COBICISTAT 800-150 MG PO TABS: 1.0000 | ORAL_TABLET | Freq: Every day | ORAL | 3 refills | Status: DC

## 2016-03-30 MED ORDER — RILPIVIRINE HCL 25 MG PO TABS: 25.0000 mg | ORAL_TABLET | Freq: Every day | ORAL | 3 refills | Status: DC

## 2016-03-30 NOTE — Telephone Encounter (Signed)
Left message for patient to call [facility] Financial Counselor to apply for RW and to completed a Dental Referral Form.

## 2016-04-24 ENCOUNTER — Telehealth: Payer: Self-pay | Admitting: Oncology

## 2016-04-24 NOTE — Telephone Encounter (Signed)
Per 4/16 schedule message moved 4/19 f/u to 4/18. Left message for patient.

## 2016-04-26 ENCOUNTER — Ambulatory Visit: Payer: Medicare Other | Admitting: Oncology

## 2016-04-27 ENCOUNTER — Ambulatory Visit: Payer: BLUE CROSS/BLUE SHIELD | Admitting: Oncology

## 2016-05-09 ENCOUNTER — Ambulatory Visit (HOSPITAL_BASED_OUTPATIENT_CLINIC_OR_DEPARTMENT_OTHER): Payer: Medicare Other | Admitting: Oncology

## 2016-05-09 VITALS — BP 124/73 | HR 60 | Temp 98.2°F | Resp 18 | Ht 74.0 in | Wt 137.4 lb

## 2016-05-09 DIAGNOSIS — Z86718 Personal history of other venous thrombosis and embolism: Secondary | ICD-10-CM | POA: Diagnosis not present

## 2016-05-09 DIAGNOSIS — Z85048 Personal history of other malignant neoplasm of rectum, rectosigmoid junction, and anus: Secondary | ICD-10-CM | POA: Diagnosis not present

## 2016-05-09 DIAGNOSIS — C210 Malignant neoplasm of anus, unspecified: Secondary | ICD-10-CM

## 2016-05-09 NOTE — Progress Notes (Signed)
  [facility] OFFICE PROGRESS NOTE   Diagnosis: Anal cancer  INTERVAL HISTORY:   [name] [name] returns for a scheduled visit. He has a good appetite and energy level. He has thin bowel movements. He has intermittent blood on the toilet tissue. No blood in the stool. He complains of intermittent pain with urination. He is followed by urology at [facility] for a urethral stricture. He is scheduled for a follow-up cystoscopy and dilation as needed. He continues close follow-up with [name]. He saw dermatology for a possible basal cell carcinoma at the right face.  Objective:  Vital signs in last 24 hours:  Blood pressure 124/73, pulse 60, temperature 98.2 F (36.8 C), temperature source Oral, resp. rate 18, height 6\' 2"  (1.88 m), weight 137 lb 6.4 oz (62.3 kg), SpO2 100 %.    HEENT: Neck without mass Lymphatics: No cervical, supraclavicular, axillary, or inguinal nodes Resp: Lungs clear bilaterally Cardio: Regular rate and rhythm GI: No hepatosplenomegaly, no mass, nontender, scarring with skin thickening surrounding the perineum. Skin thickening/scarring in the right groin. I cannot visualize the anus. A few areas of superficial ulceration at the upper gluteal fold. No nodularity. Vascular: No leg edema Skin: Faint area of "pearly "change at the right face, no discrete nodule     Lab Results:  Lab Results  Component Value Date   WBC 7.5 02/14/2016   HGB 11.0 (L) 02/14/2016   HCT 33.5 (L) 02/14/2016   MCV 97.4 02/14/2016   PLT 226 02/14/2016   NEUTROABS 7.6 06/22/2014     Medications: I have reviewed the patient's current medications.  Assessment/Plan: 1. Anal cancer diagnosed in April 2008 status post concurrent chemotherapy and radiation. He is status post a negative perianal skin biopsy, November 27, 2006. 2. Status post bilateral inguinal hernia repair, January 08, 2007. 3. Hematoma following the inguinal hernia repair status post evacuation of the  hematoma, January 15, 2007. The right lower quadrant wound healed by secondary intention, and there is a remaining wound defect in the right lower abdomen. 4. History of left lower extremity deep venous thrombosis. He is maintained off of Coumadin. 5. History of thrombocytopenia secondary to chemotherapy, radiation, polypharmacy and human immunodeficiency virus infection. 6. History of anemia secondary to surgery, the right groin hematoma and human immunodeficiency virus infection. 7. Human immunodeficiency virus infection followed by [name]. 8. History of oral candidiasis. He no longer takes Diflucan. 9. History of anorexia/weight loss. Stable today.  10. History of hematuria, status post a cystoscopy revealing urethral stricture, status post a dilatation procedure by Dr. Karsten Ro. 11. Extensive perineal/groin radiation fibrosis and scarring. No clinical evidence for local recurrence of cancer. 12. Hospitalized in July 2015 with a right iliopsoas abscess and septic right hip. He underwent resection of the right hip and debridement of a decubitus ulcer. 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 30. Hospitalized 03/15/2015 with right hip infection status post irrigation and debridement; completed IV antibiotics 04/28/2015. 14. Urethral stricture-followed at [facility]   Disposition:  [name] [name] remains in clinical remission from anal cancer. He has a good prognosis for a long-term disease-free survival. He would like to be discharged from the Oncology clinic and continue follow-up with [name]. We are available to see him in the future as needed.  15 minutes were spent with the patient today. The majority of the time was used for counseling and coordination of care.  Betsy Coder, MD  05/09/2016  8:30 AM

## 2016-06-22 IMAGING — CT CT PELVIS W/ CM
2 of 5 series · 7 of 46 positions shown, 8 images · IV contrast (omnipaque)
Comparison: Abdominal pelvic CT 05/07/2007 and 02/25/2003. Outside
study is unavailable.

CLINICAL DATA: Right hip pain. Per [REDACTED] notes, outside "CT scan of
a right hip shows evidence of an infection around the hip and
tracking into the pelvis along the iliopsoas muscle. The patient
will require admission to the hospital, I indicated that he should
go to the emergency room tonight or tomorrow morning and seek
medical attention".

EXAM:
CT PELVIS WITH CONTRAST
TECHNIQUE: Multidetector CT imaging of the pelvis was performed using the
standard protocol following the bolus administration of intravenous
contrast.
CONTRAST:  100mL OMNIPAQUE IOHEXOL 300 MG/ML  SOLN

[Series 204: soft tissue · axial · 0.55mm/px · z∈[-338,-108]mm · 4 of 157 slices shown, 5 images]
[im 21/157  soft-tissue]
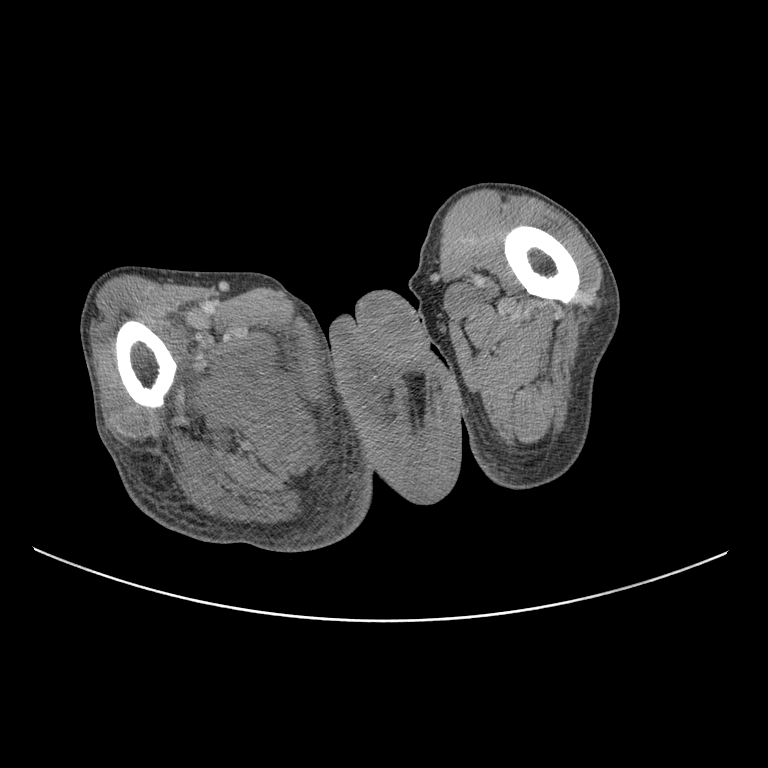
[im 21/157  bone]
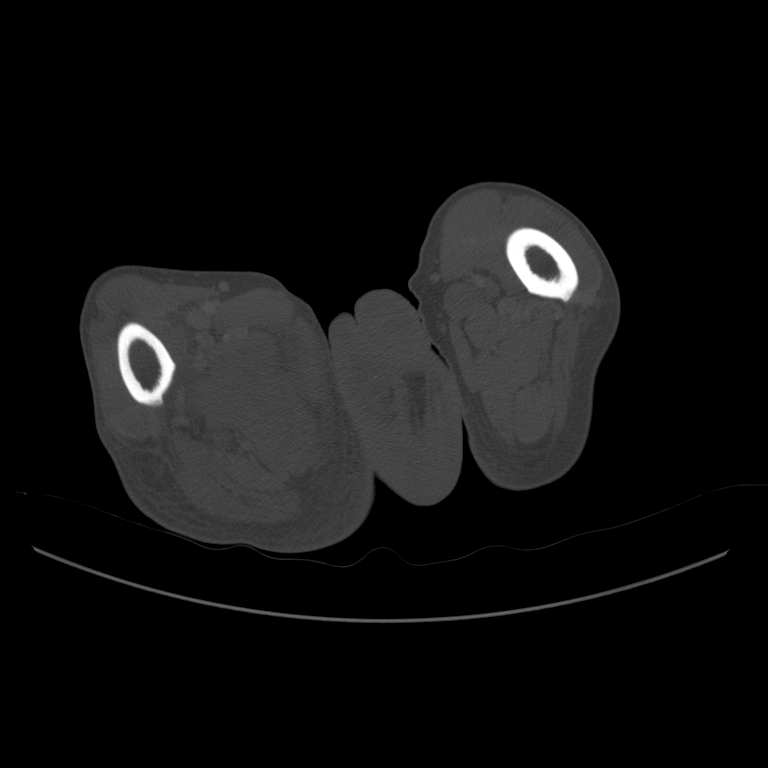
[im 61/157  soft-tissue]
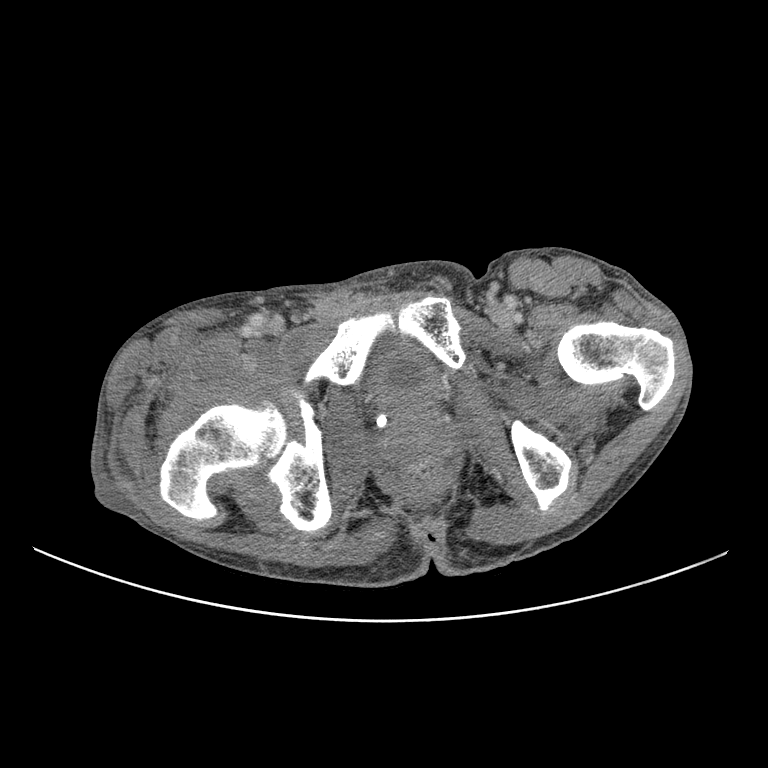
[im 96/157  soft-tissue]
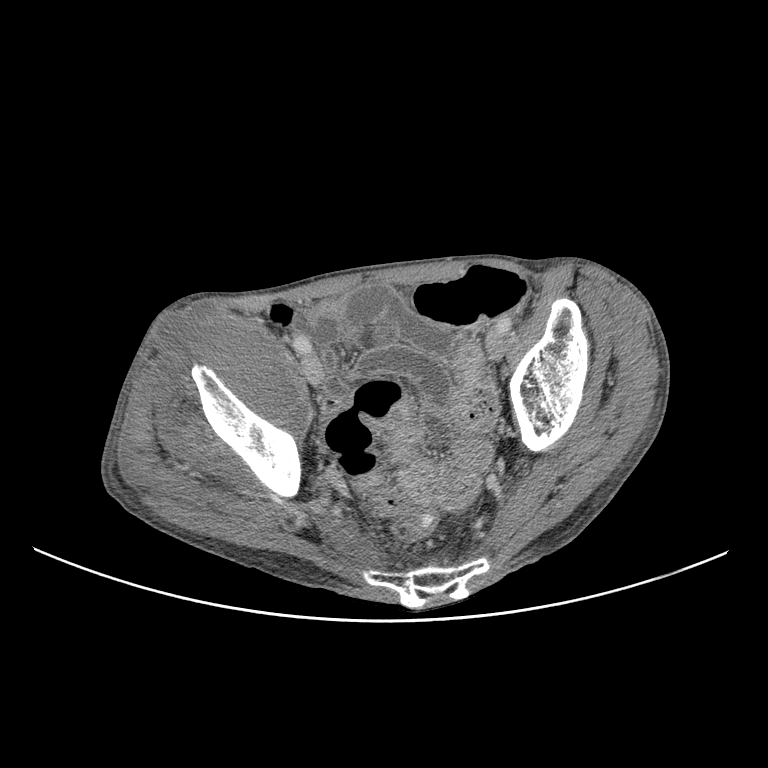
[im 136/157  soft-tissue]
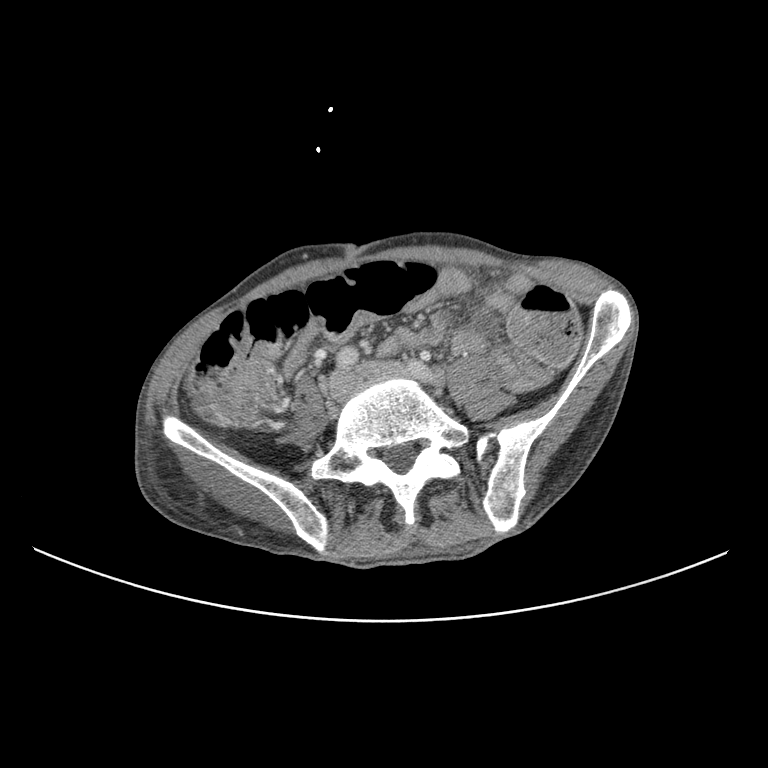

[Series 217: coronals soft · coronal · 0.34mm/px · 3 of 97 slices shown]
[im 20/97  soft-tissue]
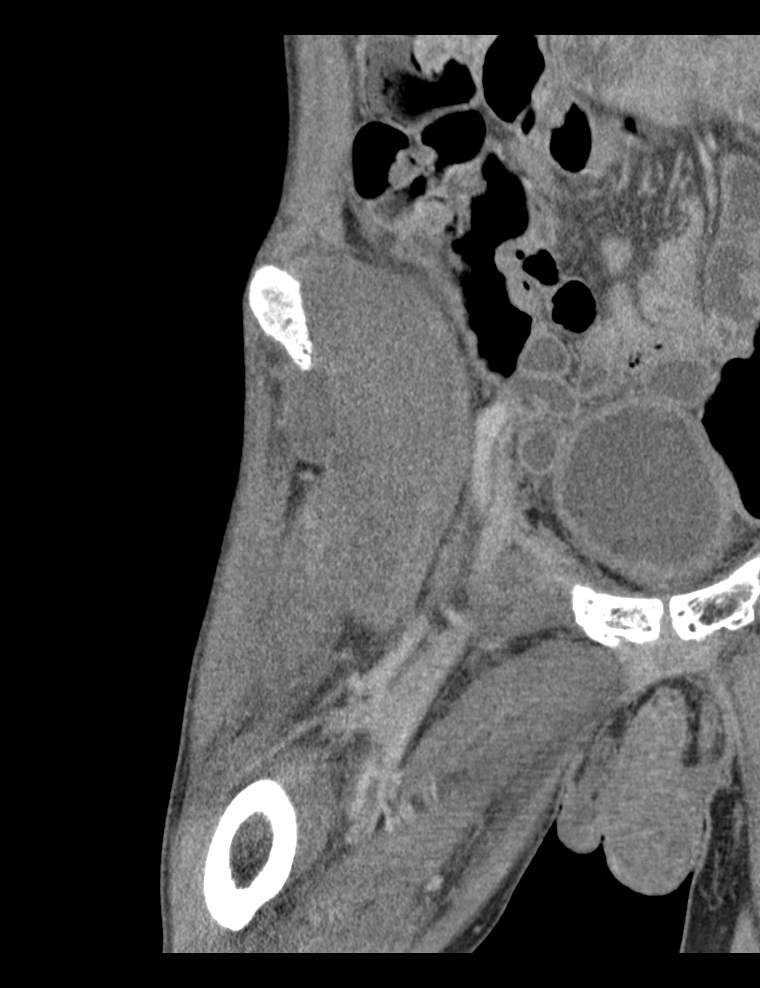
[im 39/97  soft-tissue]
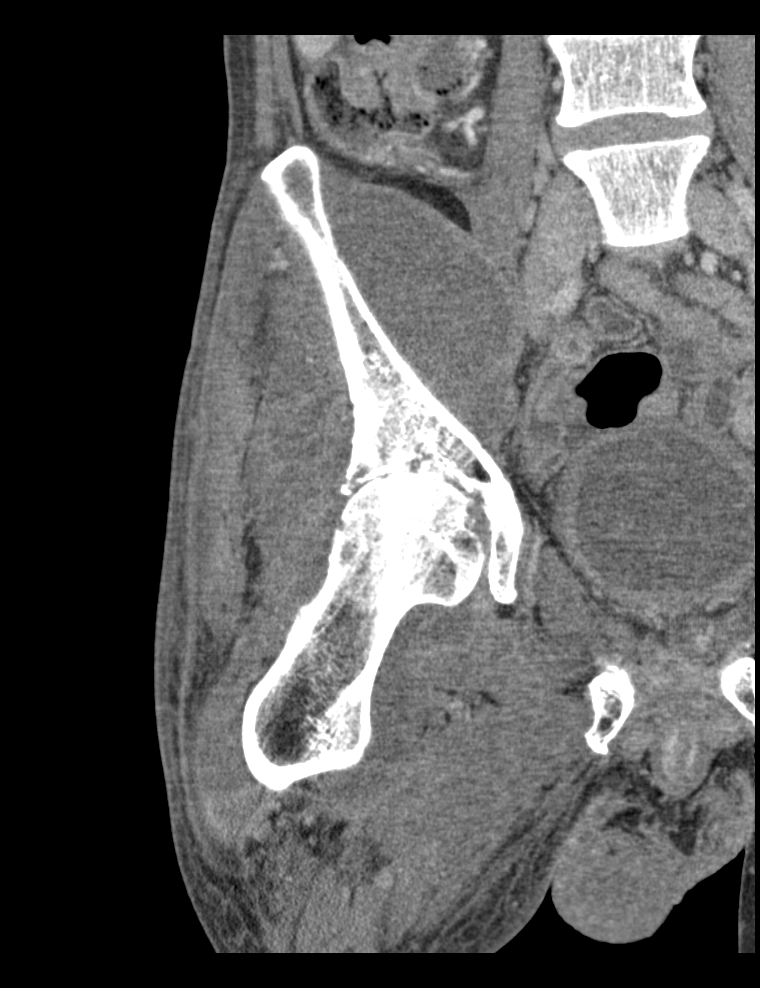
[im 58/97  soft-tissue]
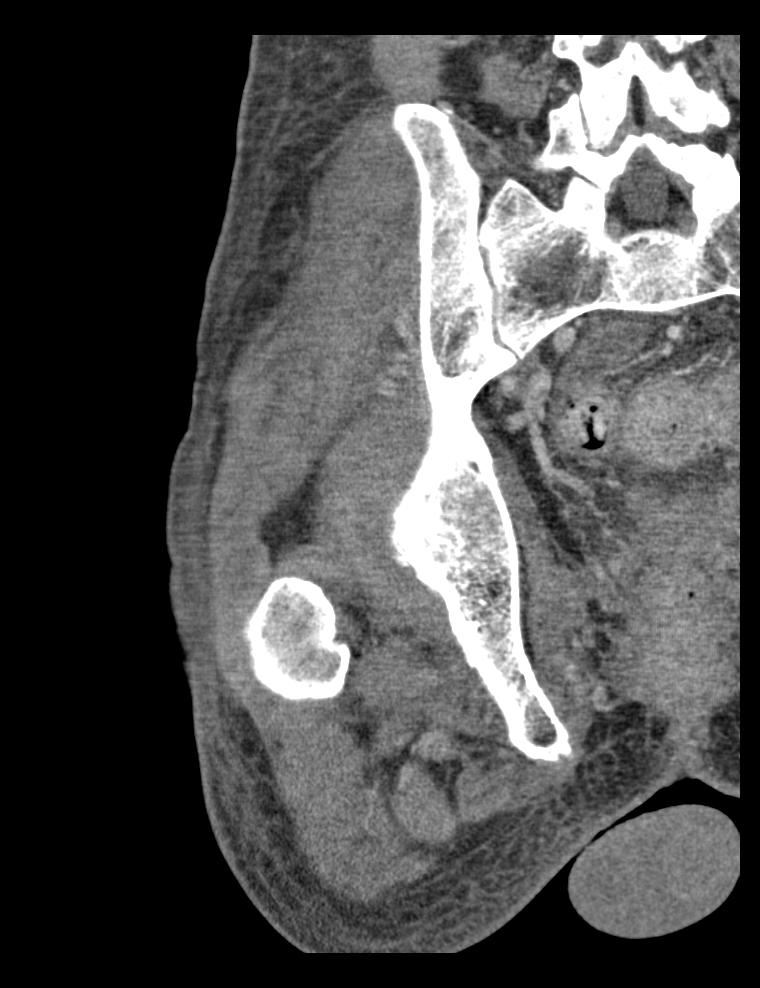

[7 of 46 positions shown; findings below may reference images not displayed]

FINDINGS: There is a large complex right hip joint effusion with fluid
tracking into the iliopsoas bursa. There is a small amount of air
anteriorly within the joint on image 85. Complex fluid within the
iliopsoas bursa extends superiorly along the right iliacus muscle,
measuring up to 8.9 x 5.2 cm transverse on image 48.

Since the prior CT, the patient has developed marked right hip joint
space loss with multifocal destruction of the acetabulum consistent
with osteomyelitis. The femoral head demonstrates mild rarefaction
without gross cortical destruction or subchondral collapse. There is
no dislocation. Apart from the acetabulum, there is no evidence of
pelvic osteomyelitis. The sacroiliac joints and lower lumbar spine
appear intact.

There is decubitus ulceration over the lower sacrum and coccyx with
associated soft tissue emphysema. There is no gross destruction of
the sacrum or coccyx. There appears to be some induration of the
skin posterolateral to the right femoral greater trochanter.

Chronic bladder wall thickening appear stable.
IMPRESSION: 1. Findings are consistent with septic arthritis of the right hip
with osteomyelitis of the acetabulum. There is probable early
osteomyelitis of the femoral head.
2. Associated infected iliopsoas bursa with superior extension of a
large abscess into the pelvis between the iliacus muscle and the
right iliac bone.
3. Decubitus ulcer over the lower sacrum and coccyx.

## 2016-07-16 ENCOUNTER — Other Ambulatory Visit: Payer: Self-pay | Admitting: Infectious Diseases

## 2016-07-16 DIAGNOSIS — M00051 Staphylococcal arthritis, right hip: Secondary | ICD-10-CM

## 2016-12-11 ENCOUNTER — Other Ambulatory Visit: Payer: Medicare Other

## 2016-12-14 ENCOUNTER — Other Ambulatory Visit: Payer: Medicare Other

## 2016-12-22 ENCOUNTER — Other Ambulatory Visit: Payer: Medicare Other

## 2016-12-22 DIAGNOSIS — K029 Dental caries, unspecified: Secondary | ICD-10-CM

## 2016-12-22 DIAGNOSIS — Z79899 Other long term (current) drug therapy: Secondary | ICD-10-CM

## 2016-12-22 DIAGNOSIS — Z113 Encounter for screening for infections with a predominantly sexual mode of transmission: Secondary | ICD-10-CM

## 2016-12-22 LAB — T-HELPER CELL (CD4) - (RCID CLINIC ONLY)
CD4 T CELL ABS: 260 /uL — AB (ref 400–2700)
CD4 T CELL HELPER: 16 % — AB (ref 33–55)

## 2016-12-25 ENCOUNTER — Ambulatory Visit: Payer: Medicare Other | Admitting: Infectious Diseases

## 2016-12-25 LAB — LIPID PANEL
CHOL/HDL RATIO: 2.3 (calc) (ref <5.0)
CHOLESTEROL: 161 mg/dL (ref <200)
HDL: 69 mg/dL (ref >40)
LDL CHOLESTEROL (CALC): 77 mg/dL
Non-HDL Cholesterol (Calc): 92 mg/dL (calc) (ref <130)
Triglycerides: 69 mg/dL (ref <150)

## 2016-12-25 LAB — CBC
HCT: 36.5 % — ABNORMAL LOW (ref 38.5–50.0)
HEMOGLOBIN: 12.5 g/dL — AB (ref 13.2–17.1)
MCH: 32.1 pg (ref 27.0–33.0)
MCHC: 34.2 g/dL (ref 32.0–36.0)
MCV: 93.8 fL (ref 80.0–100.0)
MPV: 9.3 fL (ref 7.5–12.5)
PLATELETS: 168 10*3/uL (ref 140–400)
RBC: 3.89 10*6/uL — AB (ref 4.20–5.80)
RDW: 13.9 % (ref 11.0–15.0)
WBC: 7.6 10*3/uL (ref 3.8–10.8)

## 2016-12-25 LAB — COMPREHENSIVE METABOLIC PANEL
AG RATIO: 1.4 (calc) (ref 1.0–2.5)
ALKALINE PHOSPHATASE (APISO): 68 U/L (ref 40–115)
ALT: 8 U/L — AB (ref 9–46)
AST: 14 U/L (ref 10–35)
Albumin: 4.1 g/dL (ref 3.6–5.1)
BILIRUBIN TOTAL: 0.7 mg/dL (ref 0.2–1.2)
BUN/Creatinine Ratio: 12 (calc) (ref 6–22)
BUN: 19 mg/dL (ref 7–25)
CALCIUM: 8.9 mg/dL (ref 8.6–10.3)
CO2: 25 mmol/L (ref 20–32)
Chloride: 103 mmol/L (ref 98–110)
Creat: 1.57 mg/dL — ABNORMAL HIGH (ref 0.70–1.33)
Globulin: 3 g/dL (calc) (ref 1.9–3.7)
Glucose, Bld: 79 mg/dL (ref 65–99)
POTASSIUM: 3.8 mmol/L (ref 3.5–5.3)
SODIUM: 137 mmol/L (ref 135–146)
Total Protein: 7.1 g/dL (ref 6.1–8.1)

## 2016-12-25 LAB — RPR: RPR Ser Ql: NONREACTIVE

## 2016-12-26 LAB — HIV-1 RNA QUANT-NO REFLEX-BLD
HIV 1 RNA Quant: <20 copies/mL — AB
HIV-1 RNA Quant, Log: <1.3 Log copies/mL — AB

## 2017-01-12 ENCOUNTER — Ambulatory Visit (HOSPITAL_COMMUNITY)
Admission: EM | Admit: 2017-01-12 | Discharge: 2017-01-12 | Disposition: A | Discharge status: Home / Self Care | Payer: Medicare Other | Attending: Family Medicine | Admitting: Family Medicine

## 2017-01-12 ENCOUNTER — Ambulatory Visit (INDEPENDENT_AMBULATORY_CARE_PROVIDER_SITE_OTHER): Payer: Medicare Other

## 2017-01-12 ENCOUNTER — Encounter (HOSPITAL_COMMUNITY): Payer: Self-pay | Admitting: Emergency Medicine

## 2017-01-12 DIAGNOSIS — M25562 Pain in left knee: Secondary | ICD-10-CM

## 2017-01-12 MED ORDER — METHYLPREDNISOLONE SODIUM SUCC 125 MG IJ SOLR
INTRAMUSCULAR | Status: AC
  Filled 2017-01-12: qty 2

## 2017-01-12 MED ORDER — KETOROLAC TROMETHAMINE 30 MG/ML IJ SOLN
INTRAMUSCULAR | Status: AC
  Filled 2017-01-12: qty 1

## 2017-01-12 MED ORDER — KETOROLAC TROMETHAMINE 30 MG/ML IJ SOLN
30.0000 mg | Freq: Once | INTRAMUSCULAR | Status: AC
  Administered 2017-01-12: 30 mg via INTRAMUSCULAR

## 2017-01-12 MED ORDER — METHYLPREDNISOLONE SODIUM SUCC 125 MG IJ SOLR
80.0000 mg | Freq: Once | INTRAMUSCULAR | Status: AC
  Administered 2017-01-12: 80 mg via INTRAMUSCULAR

## 2017-01-12 NOTE — Discharge Instructions (Addendum)
No obvious fracture or dislocation on x-ray today.  Toradol and prednisone injection in office today.  Knee sleeve during activity.  Continue ice compress.  Please follow-up with Dr. Honor Loh for further evaluation and management needed.

## 2017-01-12 NOTE — ED Triage Notes (Signed)
PT reports he struck his left knee on two doors Wednesday. Left knee is swollen and he is not bearing full weight

## 2017-01-12 NOTE — ED Provider Notes (Signed)
[facility]    CSN: [id number] Arrival date & time: 01/12/17  1507     History   Chief Complaint Chief Complaint  Patient presents with  . Knee Pain    HPI [name] [name] is a 57 y.o. male.   57 year old male with history of arthritis, gout, HIV, rectal cancer, renal disease comes in for 2-day history of left knee pain.  States that he injured his left knee twice 2 days ago, once with a revolving door, although with a screen door.  He has had increased swelling with pain.  Now using his walker to walk due to pain, where he usually does not use a walker.  Denies fever, chills, night sweats.  He has been doing ice compress with intermittent ibuprofen without relief.  Pain with weightbearing.  Denies numbness, tingling.  Patient with CD4 count below 400, stable viral load.  He is followed by infectious disease.      Past Medical History:  Diagnosis Date  . Arthritis   . Decubitus ulcer of sacral area   . Exposure to hepatitis B   . H/O hypotension   . History of gout   . History of transfusion   . 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 HIV (human immunodeficiency virus infection) ([place])   . Hx of sepsis 2015   affected rt hip / femur  . Pain in limb 07/23/2013  . Radiation   . 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 Shingles    left shoulder, right leg    Patient Active Problem List   Diagnosis Date Noted  . Renal failure, chronic 06/10/2014  . Septic arthritis of hip ([place]) 07/31/2013  . Decubitus ulcer of sacral region, stage 2 07/31/2013  . Protein-calorie malnutrition, severe ([place]) 07/31/2013  . Pain in limb 07/23/2013  . Inguinal adenopathy 02/18/2013  . Enlargement of lymph nodes 01/13/2013  . 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 ANEMIA, CHRONIC 03/20/2007  . Dental caries 03/20/2007  . INGUINAL HERNIORRHAPHY, RIGHT, HX OF 02/14/2007  . DVT 08/22/2006  . 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 ABSCESS, RECTUM 04/11/2006  .  CANDIDIASIS, MOUTH ([place]) 02/16/2006  . DIARRHEA 02/16/2006  . Human immunodeficiency virus (HIV) disease ([place]) 10/19/2005  . MOLLUSCUM CONTAGIOSUM 10/19/2005  . GOUT 10/19/2005  . HEPATITIS B, HX OF 10/19/2005  . HERPES ZOSTER, HX OF 10/19/2005    Past Surgical History:  Procedure Laterality Date  . CONVERSION TO TOTAL HIP Right 06/15/2014   Procedure: REMOVAL OF ANTIBIOTIC  SPACER AND CONVERSION TO RIGHT TOTAL HIP  ARTHROPLASTY ;  Surgeon: Paralee Cancel, MD;  Location: [facility];  Service: Orthopedics;  Laterality: Right;  . CYSTOSCOPY    . HEMATOMA EVACUATION    . HERNIA REPAIR     right X2  . INCISION AND DRAINAGE HIP Right 07/31/2013   Procedure: IRRIGATION AND DEBRIDEMENT HIP WITH PERCUTANEOUS DRAIN PLACEMENT;  Surgeon: [name], MD;  Location: [facility];  Service: Orthopedics;  Laterality: Right;  . INCISION AND DRAINAGE HIP Right 03/15/2015   Procedure: IRRIGATION AND DEBRIDEMENT RIGHT HIP WITH  HEAD BALL  AND POLY LINER EXCHANGE;  Surgeon: Paralee Cancel, MD;  Location: [facility];  Service: Orthopedics;  Laterality: Right;  . INCISION AND DRAINAGE OF WOUND N/A 08/25/2013   Procedure: IRRIGATION AND DEBRIDEMENT OF SACRAL ULCER WITH PLACEMENT OF A CELL ;  Surgeon: Theodoro Kos, DO;  Location: [facility];  Service: Plastics;  Laterality:  N/A;  . INCISION AND DRAINAGE OF WOUND N/A 09/01/2013   Procedure: IRRIGATION AND DEBRIDEMENT SACRAL ULCER WITH PLACEMENT OF A CELL;  Surgeon: Theodoro Kos, DO;  Location: [facility];  Service: Plastics;  Laterality: N/A;  . INCISION AND DRAINAGE OF WOUND N/A 09/08/2013   Procedure: IRRIGATION AND DEBRIDEMENT OF SACRAL ULCER WITH PLACEMENT OF A CELL AND VAC;  Surgeon: Theodoro Kos, DO;  Location: [facility];  Service: Plastics;  Laterality: N/A;  . INCISION AND DRAINAGE OF WOUND N/A 09/18/2013   Procedure: IRRIGATION AND DEBRIDEMENT OF SACRAL WOUND WITH PLACEMENT OF A-CELL;  Surgeon: Theodoro Kos, DO;  Location: [facility];  Service: Plastics;  Laterality: N/A;  . PILONIDAL  CYST DRAINAGE N/A 08/09/2013   Procedure: IRRIGATION AND DEBRIDEMENT SACRAL DECUBITUS;  Surgeon: Harl Bowie, MD;  Location: [facility];  Service: General;  Laterality: N/A;  . TOTAL HIP ARTHROPLASTY Right 08/04/2013   Procedure: RESECTION HIP JOINT - PLACEMENT OF CEMENT PROSTHESIS;  Surgeon: [name], MD;  Location: [facility];  Service: Orthopedics;  Laterality: Right;  . TRANSURETHRAL RESECTION OF PROSTATE  JAN 2016       Home Medications    Prior to Admission medications   Medication Sig Start Date End Date Taking? Authorizing Provider  darunavir-cobicistat (PREZCOBIX) 800-150 MG tablet Take 1 tablet by mouth daily. Swallow whole. Do NOT crush, break or chew tablets. Take with food. 03/30/16  Yes [name], MD  dolutegravir (TIVICAY) 50 MG tablet Take 1 tablet (50 mg total) by mouth daily. 03/30/16  Yes [name], MD  levofloxacin (LEVAQUIN) 500 MG tablet TAKE 1 TABLET(500 MG) BY MOUTH DAILY 07/17/16  Yes [name], MD  rilpivirine (EDURANT) 25 MG TABS tablet Take 1 tablet (25 mg total) by mouth daily with breakfast. 03/30/16  Yes [name], MD  celecoxib (CELEBREX) 50 MG capsule Take 1 capsule (50 mg total) by mouth 2 (two) times daily. 03/01/16   [name], MD  docusate sodium (COLACE) 100 MG capsule Take 1 capsule (100 mg total) by mouth 2 (two) times daily. Patient not taking: Reported on 03/01/2016 03/18/15   Danae Orleans, PA-C  feeding supplement, ENSURE COMPLETE, (ENSURE COMPLETE) LIQD Take 237 mLs by mouth 3 (three) times daily as needed (nutrtional supplement). Reported on 03/10/2015    [provider]  methocarbamol (ROBAXIN) 500 MG tablet Take 1 tablet (500 mg total) by mouth every 6 (six) hours as needed for muscle spasms. Patient not taking: Reported on 03/01/2016 06/17/14   Danae Orleans, PA-C  metoCLOPramide (REGLAN) 10 MG tablet Take 1 tablet (10 mg total) by mouth every 8 (eight) hours as needed (hiccups). Patient not taking:  Reported on 03/01/2016 03/18/15   Danae Orleans, PA-C  polyethylene glycol (MIRALAX / Floria Raveling) packet Take 17 g by mouth 2 (two) times daily. Patient not taking: Reported on 03/01/2016 03/18/15   Danae Orleans, PA-C  vitamin C (ASCORBIC ACID) 500 MG tablet Take 1,000 mg by mouth daily.     [provider]    Family History Family History  Problem Relation Age of Onset  . Diabetes Mother   . Dementia Mother   . Atrial fibrillation Mother   . Hypertension Mother   . Cancer Father   . Emphysema Father     Social History Social History   Tobacco Use  . Smoking status: Former Smoker    Packs/day: 0.30    Types: Cigarettes    Last attempt to quit: 07/10/2013  Years since quitting: 3.5  . Smokeless tobacco: Never Used  Substance Use Topics  . Alcohol use: No    Alcohol/week: 1.8 oz    Types: 3 Standard drinks or equivalent per week  . Drug use: No    Comment: stopped 07/10/13     Allergies   Collagen   Review of Systems Review of Systems  Reason unable to perform ROS: See HPI as above.     Physical Exam Triage Vital Signs ED Triage Vitals  Enc Vitals Group     BP 01/12/17 1543 128/85     Pulse Rate 01/12/17 1543 67     Resp 01/12/17 1543 16     Temp 01/12/17 1543 98.3 F (36.8 C)     Temp Source 01/12/17 1543 Oral     SpO2 01/12/17 1543 100 %     Weight 01/12/17 1544 140 lb (63.5 kg)     Height --      Head Circumference --      Peak Flow --      Pain Score 01/12/17 1544 6     Pain Loc --      Pain Edu? --      Excl. in [place]? --    No data found.  Updated Vital Signs BP 128/85   Pulse 67   Temp 98.3 F (36.8 C) (Oral)   Resp 16   Wt 140 lb (63.5 kg)   SpO2 100%   BMI 17.97 kg/m   Physical Exam  Constitutional: He is oriented to person, place, and time. He appears well-developed and well-nourished. No distress.  HENT:  Head: Normocephalic and atraumatic.  Eyes: Conjunctivae are normal. Pupils are equal, round, and reactive to light.    Musculoskeletal:  Significant left medial swelling, without erythema, increased warmth.  No contusion/ecchymosis seen.  Tenderness to palpation of medial left knee.  Full range of motion of the knee.  Strength normal and equal bilaterally.  Sensation intact and equal bilaterally.  Neurological: He is alert and oriented to person, place, and time.     UC Treatments / Results  Labs (all labs ordered are listed, but only abnormal results are displayed) Labs Reviewed - No data to display  EKG  EKG Interpretation None       Radiology Dg Knee Complete 4 Views Left  Result Date: 01/12/2017 CLINICAL DATA:  Patient hit left knee on a swing and gait. Swelling of the knee. EXAM: LEFT KNEE - COMPLETE 4+ VIEW COMPARISON:  None. FINDINGS: There is a moderate suprapatellar joint effusion. There is mild chondrocalcinosis of hyaline cartilage. There is slight joint space narrowing of the femorotibial compartment. Mild bony protuberance/exostosis noted off the medial condyle along the course the MCL. Slight cortical irregularity is also seen on the lateral view along the posterior aspect of the medial femoral condyle. Findings or more likely to be and degenerative in etiology. The possibility of a subtle fracture in this region is not entirely excluded given irregular cortical appearing margins. The bones are slightly demineralized in appearance. IMPRESSION: 1. Moderate suprapatellar joint effusion. Mild chondrocalcinosis of hyaline cartilage. 2. Bony protuberance off the medial aspect of the medial femoral condyle and cortical irregularity noted along the posterior aspect may be related to old remote trauma. Should symptoms persist and warrant, CT may help further correlation to exclude a more occult fracture. Electronically Signed   By: Ashley Royalty M.D.   On: 01/12/2017 16:28    Procedures Procedures (including critical care time)  Medications  Ordered in UC Medications  ketorolac (TORADOL) 30 MG/ML  injection 30 mg (30 mg Intramuscular Given 01/12/17 1739)  methylPREDNISolone sodium succinate (SOLU-MEDROL) 125 mg/2 mL injection 80 mg (80 mg Intramuscular Given 01/12/17 1739)     Initial Impression / Assessment and Plan / UC Course  I have reviewed the triage vital signs and the nursing notes.  Pertinent labs & imaging results that were available during my care of the patient were reviewed by me and considered in my medical decision making (see chart for details).    Discussed x-ray results with patient.  Concerns for continued NSAID treatment due to increased creatinine.  Concerns for long-term prednisone use due to HIV status, history of cancer.  Discussed case with Dr. Valere Dross.  Will give Toradol injection and Solu-Medrol injection in office today.  Knee sleeve for activity.  Patient to elevate leg and apply ice compress.  Patient to follow-up with his orthopedic on Monday for further evaluation and treatment needed.  Return precautions given.  Patient expresses understanding and agrees to plan.   Final Clinical Impressions(s) / UC Diagnoses   Final diagnoses:  Acute pain of left knee    ED Discharge Orders    None        Ok Edwards, Hershal Coria 01/12/17 2201

## 2017-01-16 ENCOUNTER — Encounter: Payer: Self-pay | Admitting: Infectious Diseases

## 2017-01-16 ENCOUNTER — Ambulatory Visit (INDEPENDENT_AMBULATORY_CARE_PROVIDER_SITE_OTHER): Payer: Medicare Other | Admitting: Infectious Diseases

## 2017-01-16 VITALS — BP 135/89 | HR 80 | Temp 97.5°F | Ht 74.0 in | Wt 135.0 lb

## 2017-01-16 DIAGNOSIS — N182 Chronic kidney disease, stage 2 (mild): Secondary | ICD-10-CM

## 2017-01-16 DIAGNOSIS — K089 Disorder of teeth and supporting structures, unspecified: Secondary | ICD-10-CM | POA: Diagnosis not present

## 2017-01-16 DIAGNOSIS — C210 Malignant neoplasm of anus, unspecified: Secondary | ICD-10-CM

## 2017-01-16 DIAGNOSIS — Z79899 Other long term (current) drug therapy: Secondary | ICD-10-CM | POA: Diagnosis not present

## 2017-01-16 DIAGNOSIS — M00051 Staphylococcal arthritis, right hip: Secondary | ICD-10-CM | POA: Diagnosis not present

## 2017-01-16 DIAGNOSIS — Z23 Encounter for immunization: Secondary | ICD-10-CM | POA: Diagnosis not present

## 2017-01-16 DIAGNOSIS — B20 Human immunodeficiency virus [HIV] disease: Secondary | ICD-10-CM

## 2017-01-16 DIAGNOSIS — E43 Unspecified severe protein-calorie malnutrition: Secondary | ICD-10-CM

## 2017-01-16 DIAGNOSIS — Z113 Encounter for screening for infections with a predominantly sexual mode of transmission: Secondary | ICD-10-CM

## 2017-01-16 NOTE — Assessment & Plan Note (Signed)
He is doing well Flu shot today Continue his current meds Offered/refused condoms.  Offered to set up for colonoscopy- agrees.  rtc in 6 months.

## 2017-01-16 NOTE — Assessment & Plan Note (Signed)
Will try to get him into dental.

## 2017-01-16 NOTE — Assessment & Plan Note (Signed)
Will work on getting him Ensure

## 2017-01-16 NOTE — Assessment & Plan Note (Signed)
Will work on getting him ensure.

## 2017-01-16 NOTE — Progress Notes (Signed)
   Subjective:    Patient ID: [name] [name], male    DOB: [birth year], 57 y.o.   MRN: [id number]  HPI 57 yo M with HIV/AIDS, and rectal CA (2008).  He had Onc f/u on 05-2016 and was to have prn f/u.  No problems with meds- Prezcobix/edurant/tivicay qday. He has previously been on: ATVr/TFV/CBV, EFV, KLT.  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 He underwent resection of his R hip and debridement of his decubitus ulcer. His Cx grew group B Strep. He was treated with 6 weeks of ceftriaxone.  He underwent R THR on 06-15-14. Was placed on doxy post-op.  TURP 01-2014.  He was seen in ID clinic on 03-10-15 and complained of swelling over his L hip. He thought this was related to how he had slept on it. He was also noted to have fluctuance over his R hip. He was seen by ortho on 3-2 and had R THR joint aspirate. He had molecular test (synovasure) done on fluid which revealed streptococci. He was adm to hospital on 3-6 and underwent I &D and poly-exchange. He was started on ceftriaxone post-op with plan to continue for 1 month.He was d/c home 03-18-15.  He has been taking levaquin chronically since 07-2015.  Has been eval by Uro for urethral stricture.   Was seen in ED on 1-4 after a "swinging gate" door closed on him at a bowling alley. He injured his L knee and required a walker. He was given toradol and steroids. He has been wearing a brace, icing. His plain films did not show acute fracture.  He is using a walker for longer distances, cain for shorter distances.  He is waiting til end of week to see if he needs to f/u with ortho [name]).    HIV 1 RNA Quant (copies/mL)  Date Value  12/22/2016 <20 DETECTED (A)  02/14/2016 <20 DETECTED (A)  06/23/2015 79 (H)   CD4 T Cell Abs (/uL)  Date Value  12/22/2016 260 (L)  02/14/2016 190 (L)  06/23/2015 270 (L)     Review of Systems  Constitutional: Negative for appetite change, chills, fever and  unexpected weight change.  Gastrointestinal: Negative for constipation and diarrhea.  Genitourinary: Negative for difficulty urinating.  Musculoskeletal: Positive for arthralgias and myalgias.      Objective:   Physical Exam  Constitutional: He appears well-developed and well-nourished.  HENT:  Mouth/Throat: Abnormal dentition. Dental caries present. No oropharyngeal exudate.  Eyes: EOM are normal. Pupils are equal, round, and reactive to light.  Neck: Neck supple.  Cardiovascular: Normal rate, regular rhythm and normal heart sounds.  Pulmonary/Chest: Effort normal and breath sounds normal.  Abdominal: Soft. Bowel sounds are normal. There is no tenderness. There is no rebound.  Musculoskeletal: He exhibits no edema.       Legs: Lymphadenopathy:    He has no cervical adenopathy.  Psychiatric: He has a normal mood and affect.       Assessment & Plan:

## 2017-01-16 NOTE — Assessment & Plan Note (Signed)
Will continue him on levaquin long term.

## 2017-01-16 NOTE — Assessment & Plan Note (Signed)
Appreciate f/u with Onc.

## 2017-01-16 NOTE — Assessment & Plan Note (Signed)
Will take motrin for pain, sparingly. With food.  Also tylenol.

## 2017-01-17 ENCOUNTER — Other Ambulatory Visit: Payer: Self-pay | Admitting: *Deleted

## 2017-01-17 MED ORDER — ENSURE COMPLETE PO LIQD: 237.0000 mL | Freq: Three times a day (TID) | ORAL | 11 refills | Status: DC | PRN

## 2017-03-16 ENCOUNTER — Encounter: Payer: Self-pay | Admitting: Infectious Diseases

## 2017-03-20 ENCOUNTER — Other Ambulatory Visit: Payer: Self-pay | Admitting: Infectious Diseases

## 2017-03-20 DIAGNOSIS — M00051 Staphylococcal arthritis, right hip: Secondary | ICD-10-CM

## 2017-03-22 ENCOUNTER — Encounter: Payer: Self-pay | Admitting: Infectious Diseases

## 2017-04-19 ENCOUNTER — Other Ambulatory Visit: Payer: Self-pay | Admitting: Infectious Diseases

## 2017-04-19 DIAGNOSIS — B20 Human immunodeficiency virus [HIV] disease: Secondary | ICD-10-CM

## 2017-08-11 ENCOUNTER — Other Ambulatory Visit: Payer: Self-pay | Admitting: Infectious Diseases

## 2017-08-11 DIAGNOSIS — B20 Human immunodeficiency virus [HIV] disease: Secondary | ICD-10-CM

## 2017-08-29 ENCOUNTER — Other Ambulatory Visit: Payer: Self-pay | Admitting: Infectious Diseases

## 2017-08-29 DIAGNOSIS — B20 Human immunodeficiency virus [HIV] disease: Secondary | ICD-10-CM

## 2017-12-14 ENCOUNTER — Ambulatory Visit: Payer: Medicare Other

## 2017-12-17 ENCOUNTER — Ambulatory Visit: Payer: Medicare Other

## 2017-12-25 ENCOUNTER — Ambulatory Visit: Payer: Medicare Other

## 2017-12-26 ENCOUNTER — Encounter: Payer: Self-pay | Admitting: Infectious Diseases

## 2017-12-31 ENCOUNTER — Other Ambulatory Visit: Payer: Medicare Other

## 2017-12-31 DIAGNOSIS — Z113 Encounter for screening for infections with a predominantly sexual mode of transmission: Secondary | ICD-10-CM

## 2017-12-31 DIAGNOSIS — Z79899 Other long term (current) drug therapy: Secondary | ICD-10-CM

## 2017-12-31 DIAGNOSIS — B20 Human immunodeficiency virus [HIV] disease: Secondary | ICD-10-CM

## 2018-01-01 LAB — T-HELPER CELL (CD4) - (RCID CLINIC ONLY)
CD4 T CELL ABS: 210 /uL — AB (ref 400–2700)
CD4 T CELL HELPER: 16 % — AB (ref 33–55)

## 2018-01-05 LAB — COMPREHENSIVE METABOLIC PANEL
AG RATIO: 1.6 (calc) (ref 1.0–2.5)
ALKALINE PHOSPHATASE (APISO): 66 U/L (ref 40–115)
ALT: 7 U/L — AB (ref 9–46)
AST: 16 U/L (ref 10–35)
Albumin: 4.2 g/dL (ref 3.6–5.1)
BILIRUBIN TOTAL: 0.6 mg/dL (ref 0.2–1.2)
BUN/Creatinine Ratio: 11 (calc) (ref 6–22)
BUN: 15 mg/dL (ref 7–25)
CALCIUM: 9.4 mg/dL (ref 8.6–10.3)
CO2: 25 mmol/L (ref 20–32)
Chloride: 107 mmol/L (ref 98–110)
Creat: 1.39 mg/dL — ABNORMAL HIGH (ref 0.70–1.33)
Globulin: 2.6 g/dL (calc) (ref 1.9–3.7)
Glucose, Bld: 144 mg/dL — ABNORMAL HIGH (ref 65–99)
Potassium: 4 mmol/L (ref 3.5–5.3)
SODIUM: 142 mmol/L (ref 135–146)
Total Protein: 6.8 g/dL (ref 6.1–8.1)

## 2018-01-05 LAB — LIPID PANEL
CHOLESTEROL: 172 mg/dL (ref <200)
HDL: 80 mg/dL (ref >40)
LDL CHOLESTEROL (CALC): 73 mg/dL
Non-HDL Cholesterol (Calc): 92 mg/dL (calc) (ref <130)
TRIGLYCERIDES: 102 mg/dL (ref <150)
Total CHOL/HDL Ratio: 2.2 (calc) (ref <5.0)

## 2018-01-05 LAB — CBC
HEMATOCRIT: 37.2 % — AB (ref 38.5–50.0)
Hemoglobin: 12.4 g/dL — ABNORMAL LOW (ref 13.2–17.1)
MCH: 32.5 pg (ref 27.0–33.0)
MCHC: 33.3 g/dL (ref 32.0–36.0)
MCV: 97.4 fL (ref 80.0–100.0)
MPV: 9.2 fL (ref 7.5–12.5)
Platelets: 165 10*3/uL (ref 140–400)
RBC: 3.82 10*6/uL — ABNORMAL LOW (ref 4.20–5.80)
RDW: 13.7 % (ref 11.0–15.0)
WBC: 5.7 10*3/uL (ref 3.8–10.8)

## 2018-01-05 LAB — RPR: RPR Ser Ql: NONREACTIVE

## 2018-01-05 LAB — HIV-1 RNA QUANT-NO REFLEX-BLD
HIV 1 RNA QUANT: 24 {copies}/mL — AB
HIV-1 RNA Quant, Log: 1.38 Log copies/mL — ABNORMAL HIGH

## 2018-01-15 ENCOUNTER — Encounter: Payer: Medicare Other | Admitting: Infectious Diseases

## 2018-01-18 ENCOUNTER — Encounter: Payer: Medicare Other | Admitting: Infectious Diseases

## 2018-01-29 ENCOUNTER — Encounter: Payer: Self-pay | Admitting: Infectious Diseases

## 2018-01-29 ENCOUNTER — Ambulatory Visit: Payer: Medicare Other | Admitting: Infectious Diseases

## 2018-01-29 VITALS — BP 157/90 | HR 60 | Temp 97.8°F | Wt 135.0 lb

## 2018-01-29 DIAGNOSIS — C210 Malignant neoplasm of anus, unspecified: Secondary | ICD-10-CM

## 2018-01-29 DIAGNOSIS — B20 Human immunodeficiency virus [HIV] disease: Secondary | ICD-10-CM

## 2018-01-29 DIAGNOSIS — K089 Disorder of teeth and supporting structures, unspecified: Secondary | ICD-10-CM | POA: Diagnosis not present

## 2018-01-29 DIAGNOSIS — Z113 Encounter for screening for infections with a predominantly sexual mode of transmission: Secondary | ICD-10-CM

## 2018-01-29 DIAGNOSIS — N182 Chronic kidney disease, stage 2 (mild): Secondary | ICD-10-CM

## 2018-01-29 DIAGNOSIS — Z23 Encounter for immunization: Secondary | ICD-10-CM

## 2018-01-29 DIAGNOSIS — Z79899 Other long term (current) drug therapy: Secondary | ICD-10-CM

## 2018-01-29 NOTE — Assessment & Plan Note (Signed)
Will work on getting him colon.

## 2018-01-29 NOTE — Addendum Note (Signed)
Addended by: Aundria Rud on: 01/29/2018 04:37 PM   Modules accepted: Orders

## 2018-01-29 NOTE — Assessment & Plan Note (Signed)
He is doing well on his deep salvage regimen He will refill his rx Gets flu vax today Will give him PCV 13 as well.  Will refer to dental.  rtc in 9 months.

## 2018-01-29 NOTE — Assessment & Plan Note (Addendum)
His Cr is slightly better from previous.  Will consider renal eval if worsening.  Asked him to limit NSAID use (he gets no relief from tylenol).  Will f/u his BP and consider anti-htn rx at next visit.

## 2018-01-29 NOTE — Assessment & Plan Note (Signed)
Will try to get him into dental.  Dental referral placed today for [facility]. Information to schedule appointment completed today.

## 2018-01-29 NOTE — Progress Notes (Signed)
   Subjective:    Patient ID: [name] [name], male    DOB: [birth year], 58 y.o.   MRN: [id number]  HPI 58yo M with HIV/AIDS, and rectal CA (2008).  He had Onc f/u on 05-2016 and was to have prn f/u.  No problems with meds- Prezcobix/edurant/tivicay qday.  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 He underwent resection of his R hip and debridement of his decubitus ulcer. His Cx grew group B Strep.   He underwent R THR on 06-15-14. Was placed on doxy post-op.  TURP 01-2014. Also seen by Uro for prev stricture.  He was seen in ID clinic on 03-10-15 and complained of swelling over his L hip. He thought this was related to how he had slept on it. He was also noted to have fluctuance over his R hip. He was seen by ortho on 3-2 and had R THR joint aspirate. He had molecular test (synovasure) done on fluid which revealed streptococci. He was adm to hospital on 3-6 and underwent I &D and poly-exchange. He has been taking levaquin chronically since 07-2015.   Has been under stress since Nov 05, 2022 since his mother died.   Has occas GI distress from levaquin, he tried probiotics without improvement. Takes prn imodium.  On no anti-htn rx.   HIV 1 RNA Quant (copies/mL)  Date Value  12/31/2017 24 (H)  12/22/2016 <20 DETECTED (A)  02/14/2016 <20 DETECTED (A)   CD4 T Cell Abs (/uL)  Date Value  12/31/2017 210 (L)  12/22/2016 260 (L)  02/14/2016 190 (L)     Review of Systems  Constitutional: Negative for appetite change and unexpected weight change.  Respiratory: Negative for shortness of breath.   Cardiovascular: Negative for chest pain.  Gastrointestinal: Negative for constipation and diarrhea.  Neurological: Negative for headaches.  Psychiatric/Behavioral: Negative for sleep disturbance.  Loose alternating solid stools.  Please see HPI. All other systems reviewed and negative.     Objective:   Physical Exam Constitutional:      Appearance:  Normal appearance.  HENT:     Mouth/Throat:     Mouth: Mucous membranes are moist.     Dentition: Abnormal dentition. Dental caries present.     Pharynx: Oropharynx is clear. No oropharyngeal exudate.  Eyes:     Extraocular Movements: Extraocular movements intact.     Pupils: Pupils are equal, round, and reactive to light.  Neck:     Musculoskeletal: Normal range of motion.  Cardiovascular:     Rate and Rhythm: Normal rate and regular rhythm.  Pulmonary:     Effort: Pulmonary effort is normal.     Breath sounds: Normal breath sounds.  Abdominal:     General: Abdomen is flat. Bowel sounds are normal.  Musculoskeletal: Normal range of motion.        General: No swelling.  Neurological:     General: No focal deficit present.     Mental Status: He is alert.  Psychiatric:        Mood and Affect: Mood normal.       Assessment & Plan:

## 2018-02-04 ENCOUNTER — Encounter: Payer: Self-pay | Admitting: Infectious Diseases

## 2018-04-15 ENCOUNTER — Encounter: Payer: Self-pay | Admitting: Infectious Diseases

## 2018-05-13 ENCOUNTER — Other Ambulatory Visit: Payer: Self-pay | Admitting: Infectious Diseases

## 2018-05-13 DIAGNOSIS — M00051 Staphylococcal arthritis, right hip: Secondary | ICD-10-CM

## 2018-11-05 ENCOUNTER — Other Ambulatory Visit: Payer: Medicare Other

## 2018-11-06 ENCOUNTER — Telehealth: Payer: Self-pay

## 2018-11-06 NOTE — Telephone Encounter (Signed)
Attempted to contact patient to reschedule appointment. Left message to call back

## 2018-11-11 ENCOUNTER — Ambulatory Visit: Payer: Medicare Other

## 2018-11-19 ENCOUNTER — Ambulatory Visit: Payer: Medicare Other

## 2018-11-19 ENCOUNTER — Other Ambulatory Visit: Payer: Medicare Other

## 2018-11-19 ENCOUNTER — Telehealth: Payer: Self-pay | Admitting: Pharmacy Technician

## 2018-11-19 ENCOUNTER — Encounter: Payer: Medicare Other | Admitting: Infectious Diseases

## 2018-11-19 ENCOUNTER — Other Ambulatory Visit: Payer: Self-pay

## 2018-11-19 DIAGNOSIS — Z79899 Other long term (current) drug therapy: Secondary | ICD-10-CM

## 2018-11-19 DIAGNOSIS — B20 Human immunodeficiency virus [HIV] disease: Secondary | ICD-10-CM

## 2018-11-19 DIAGNOSIS — Z113 Encounter for screening for infections with a predominantly sexual mode of transmission: Secondary | ICD-10-CM

## 2018-11-19 NOTE — Telephone Encounter (Addendum)
[facility] Patient Advocate Encounter   I was successful in securing patient a $7500 grant from [facility] [place] (PAF) to provide copayment coverage for Tivicay and Prezcobix. This will make the out of pocket cost $0.     I have spoken with the patient and provided him the information to give to [employer]. He will ask for a 30-day in the event that his medication is switched at his next appointment. He is pending SPAP coverage.    The billing information is as follows  RxBin: [id number] PCN: [id number] Member ID: [id number] Group ID: [id number]  Dates of Eligibility: 11/19/2018 through 11/19/2019  Patient knows to call the office with questions or concerns.  Bartholomew Crews, CPhT [facility] Patient [facility]ious [facility] Phone: [phone] Fax: [phone] 11/19/2018 5:45 PM

## 2018-11-20 ENCOUNTER — Encounter: Payer: Self-pay | Admitting: Infectious Diseases

## 2018-11-20 LAB — T-HELPER CELL (CD4) - (RCID CLINIC ONLY)
CD4 % Helper T Cell: 25 % — ABNORMAL LOW (ref 33–65)
CD4 T Cell Abs: 406 /uL (ref 400–1790)

## 2018-11-22 LAB — COMPREHENSIVE METABOLIC PANEL
AG Ratio: 1.6 (calc) (ref 1.0–2.5)
ALT: 12 U/L (ref 9–46)
AST: 21 U/L (ref 10–35)
Albumin: 4 g/dL (ref 3.6–5.1)
Alkaline phosphatase (APISO): 56 U/L (ref 35–144)
BUN/Creatinine Ratio: 11 (calc) (ref 6–22)
BUN: 15 mg/dL (ref 7–25)
CO2: 26 mmol/L (ref 20–32)
Calcium: 9.3 mg/dL (ref 8.6–10.3)
Chloride: 106 mmol/L (ref 98–110)
Creat: 1.41 mg/dL — ABNORMAL HIGH (ref 0.70–1.33)
Globulin: 2.5 g/dL (calc) (ref 1.9–3.7)
Glucose, Bld: 81 mg/dL (ref 65–99)
Potassium: 4.1 mmol/L (ref 3.5–5.3)
Sodium: 142 mmol/L (ref 135–146)
Total Bilirubin: 0.4 mg/dL (ref 0.2–1.2)
Total Protein: 6.5 g/dL (ref 6.1–8.1)

## 2018-11-22 LAB — LIPID PANEL
Cholesterol: 183 mg/dL (ref <200)
HDL: 99 mg/dL (ref >=40)
LDL Cholesterol (Calc): 70 mg/dL (calc)
Non-HDL Cholesterol (Calc): 84 mg/dL (calc) (ref <130)
Total CHOL/HDL Ratio: 1.8 (calc) (ref <5.0)
Triglycerides: 61 mg/dL (ref <150)

## 2018-11-22 LAB — CBC
HCT: 39.2 % (ref 38.5–50.0)
Hemoglobin: 13.4 g/dL (ref 13.2–17.1)
MCH: 34.9 pg — ABNORMAL HIGH (ref 27.0–33.0)
MCHC: 34.2 g/dL (ref 32.0–36.0)
MCV: 102.1 fL — ABNORMAL HIGH (ref 80.0–100.0)
MPV: 9.4 fL (ref 7.5–12.5)
Platelets: 143 10*3/uL (ref 140–400)
RBC: 3.84 10*6/uL — ABNORMAL LOW (ref 4.20–5.80)
RDW: 13.7 % (ref 11.0–15.0)
WBC: 5.5 10*3/uL (ref 3.8–10.8)

## 2018-11-22 LAB — HIV-1 RNA QUANT-NO REFLEX-BLD
HIV 1 RNA Quant: 410 copies/mL — ABNORMAL HIGH
HIV-1 RNA Quant, Log: 2.61 Log copies/mL — ABNORMAL HIGH

## 2018-11-22 LAB — RPR: RPR Ser Ql: NONREACTIVE

## 2018-12-03 ENCOUNTER — Encounter: Payer: Medicare Other | Admitting: Infectious Diseases

## 2018-12-13 ENCOUNTER — Other Ambulatory Visit: Payer: Self-pay

## 2018-12-13 DIAGNOSIS — Z20822 Contact with and (suspected) exposure to covid-19: Secondary | ICD-10-CM

## 2018-12-16 LAB — NOVEL CORONAVIRUS, NAA: SARS-CoV-2, NAA: NOT DETECTED

## 2018-12-24 ENCOUNTER — Encounter: Payer: Self-pay | Admitting: Infectious Diseases

## 2018-12-24 ENCOUNTER — Ambulatory Visit (INDEPENDENT_AMBULATORY_CARE_PROVIDER_SITE_OTHER): Payer: Medicare Other | Admitting: Infectious Diseases

## 2018-12-24 ENCOUNTER — Other Ambulatory Visit: Payer: Self-pay

## 2018-12-24 VITALS — BP 107/80 | HR 90 | Wt 144.8 lb

## 2018-12-24 DIAGNOSIS — K029 Dental caries, unspecified: Secondary | ICD-10-CM

## 2018-12-24 DIAGNOSIS — B20 Human immunodeficiency virus [HIV] disease: Secondary | ICD-10-CM

## 2018-12-24 DIAGNOSIS — C210 Malignant neoplasm of anus, unspecified: Secondary | ICD-10-CM

## 2018-12-24 DIAGNOSIS — Z23 Encounter for immunization: Secondary | ICD-10-CM

## 2018-12-24 DIAGNOSIS — M00051 Staphylococcal arthritis, right hip: Secondary | ICD-10-CM | POA: Diagnosis not present

## 2018-12-24 DIAGNOSIS — N182 Chronic kidney disease, stage 2 (mild): Secondary | ICD-10-CM

## 2018-12-24 DIAGNOSIS — Z113 Encounter for screening for infections with a predominantly sexual mode of transmission: Secondary | ICD-10-CM | POA: Diagnosis not present

## 2018-12-24 DIAGNOSIS — Z79899 Other long term (current) drug therapy: Secondary | ICD-10-CM

## 2018-12-24 NOTE — Assessment & Plan Note (Signed)
Will get colon in new year.

## 2018-12-24 NOTE — Progress Notes (Signed)
   Subjective:    Patient ID: [name] [name], male    DOB: Jan 21, 1960, 58 y.o.   MRN: [id number]  HPI 58yo M with HIV/AIDS, and rectal CA (2008). He had Onc f/u on 05-2016 and was to have prn f/u. Current meds- Prezcobix/edurant/tivicayqday. Doing fine with his current ART.  Has had 2 family members with [place]. Now living alone. Has also had outbreak at his work 2 weeks ago. [place]   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 He underwent resection of his R hip and debridement of his decubitus ulcer. His Cx grew group B Strep.   He underwent R THR on 06-15-14. Was placed on doxy post-op.  He was seen in ID clinic on 03-10-15 and complained of swelling over his L hip. He thought this was related to how he had slept on it. He was also noted to have fluctuance over his R hip. He was seen by ortho on 3-2 and had R THR joint aspirate. He had molecular test (synovasure) done on fluid which revealed streptococci. He was adm to hospital on 3-6 and underwent I &D and poly-exchange. He has been taking levaquin chronically since 07-2015. No problems with levaquin.   TURP 01-2014. Also seen by Uro for prev stricture.   no problems with urination.    HIV 1 RNA Quant (copies/mL)  Date Value  11/19/2018 410 (H)  12/31/2017 24 (H)  12/22/2016 <20 DETECTED (A)   CD4 T Cell Abs (/uL)  Date Value  11/19/2018 406  12/31/2017 210 (L)  12/22/2016 260 (L)    Review of Systems  Constitutional: Negative for appetite change and unexpected weight change.  Respiratory: Negative for cough and shortness of breath.   Gastrointestinal: Negative for constipation and diarrhea.  Genitourinary: Negative for difficulty urinating.  Musculoskeletal: Positive for arthralgias.  Psychiatric/Behavioral: Negative for dysphoric mood and sleep disturbance.  Please see HPI. All other systems reviewed and negative.     Objective:   Physical Exam Vitals  reviewed.  Constitutional:      Appearance: He is not toxic-appearing.  HENT:     Mouth/Throat:     Mouth: Mucous membranes are moist.     Dentition: Abnormal dentition. Dental caries present.     Pharynx: No oropharyngeal exudate.  Eyes:     Extraocular Movements: Extraocular movements intact.     Pupils: Pupils are equal, round, and reactive to light.  Cardiovascular:     Rate and Rhythm: Normal rate and regular rhythm.  Pulmonary:     Effort: Pulmonary effort is normal.     Breath sounds: Normal breath sounds.  Abdominal:     General: Abdomen is flat. Bowel sounds are normal. There is no distension.     Palpations: Abdomen is soft.     Tenderness: There is no abdominal tenderness.  Musculoskeletal:     Cervical back: Normal range of motion and neck supple.     Right lower leg: No edema.     Left lower leg: No edema.  Neurological:     General: No focal deficit present.     Mental Status: He is alert.  Psychiatric:        Mood and Affect: Mood normal.           Assessment & Plan:

## 2018-12-24 NOTE — Assessment & Plan Note (Signed)
Will continue prophylactic levaquin.  Will check esr and crp at next blood draw.

## 2018-12-24 NOTE — Assessment & Plan Note (Signed)
Cr remains stable.

## 2018-12-24 NOTE — Assessment & Plan Note (Signed)
Flu and PCV 23 today Mening at next visit Offered/refused condoms.  No change in his meds for now.  Will recheck his labs at f/u visit in 9 months.

## 2018-12-24 NOTE — Addendum Note (Signed)
Addended by: Carlean Purl on: 12/24/2018 04:18 PM   Modules accepted: Orders

## 2018-12-24 NOTE — Assessment & Plan Note (Addendum)
Will see dental, wants all his teeth pulled at once.  Dental referral placed today for [facility]. Information to schedule appointment completed today.

## 2019-02-06 ENCOUNTER — Other Ambulatory Visit: Payer: Self-pay

## 2019-02-06 DIAGNOSIS — B20 Human immunodeficiency virus [HIV] disease: Secondary | ICD-10-CM

## 2019-02-06 MED ORDER — TIVICAY 50 MG PO TABS: 50.0000 mg | ORAL_TABLET | Freq: Every day | ORAL | 2 refills | Status: DC

## 2019-02-06 MED ORDER — EDURANT 25 MG PO TABS: ORAL_TABLET | ORAL | 2 refills | Status: DC

## 2019-02-06 MED ORDER — PREZCOBIX 800-150 MG PO TABS: ORAL_TABLET | ORAL | 2 refills | Status: DC

## 2019-04-04 ENCOUNTER — Ambulatory Visit: Payer: Medicare Other | Attending: Internal Medicine

## 2019-04-04 DIAGNOSIS — Z23 Encounter for immunization: Secondary | ICD-10-CM

## 2019-04-04 NOTE — Progress Notes (Signed)
   Covid-19 Vaccination Clinic  Name:  [name] [name]    MRN: [id number] DOB: 1960/01/16  04/04/2019  [name] [name] was observed post Covid-19 immunization for 15 minutes without incident. He was provided with Vaccine Information Sheet and instruction to access the V-Safe system.   [name] [name] was instructed to call 911 with any severe reactions post vaccine: Marland Kitchen Difficulty breathing  . Swelling of face and throat  . A fast heartbeat  . A bad rash all over body  . Dizziness and weakness   Immunizations Administered    Name Date Dose VIS Date Route   Pfizer COVID-19 Vaccine 04/04/2019  2:55 PM 0.3 mL 12/20/2018 Intramuscular   Manufacturer: [place]   Lot: [id number]   [place]: [id number]

## 2019-04-29 ENCOUNTER — Ambulatory Visit: Payer: Medicare Other | Attending: Internal Medicine

## 2019-04-29 DIAGNOSIS — Z23 Encounter for immunization: Secondary | ICD-10-CM

## 2019-04-29 NOTE — Progress Notes (Signed)
   Covid-19 Vaccination Clinic  Name:  [name] [name]    MRN: [id number] DOB: Sep 04, 1960  04/29/2019  [name] was observed post Covid-19 immunization for 15 minutes without incident. He was provided with Vaccine Information Sheet and instruction to access the V-Safe system.   [name] was instructed to call 911 with any severe reactions post vaccine: Marland Kitchen Difficulty breathing  . Swelling of face and throat  . A fast heartbeat  . A bad rash all over body  . Dizziness and weakness   Immunizations Administered    Name Date Dose VIS Date Route   Pfizer COVID-19 Vaccine 04/29/2019  2:48 PM 0.3 mL 03/05/2018 Intramuscular   Manufacturer: [place]   Lot: [id number]   Experiment: [id number]

## 2019-09-12 ENCOUNTER — Other Ambulatory Visit: Payer: Medicare Other

## 2019-09-26 ENCOUNTER — Encounter: Payer: Medicare Other | Admitting: Infectious Diseases

## 2019-10-14 ENCOUNTER — Other Ambulatory Visit: Payer: Medicare Other

## 2019-10-15 ENCOUNTER — Other Ambulatory Visit: Payer: Self-pay

## 2019-10-15 ENCOUNTER — Other Ambulatory Visit: Payer: Medicare Other

## 2019-10-15 DIAGNOSIS — Z79899 Other long term (current) drug therapy: Secondary | ICD-10-CM

## 2019-10-15 DIAGNOSIS — B20 Human immunodeficiency virus [HIV] disease: Secondary | ICD-10-CM

## 2019-10-15 DIAGNOSIS — Z113 Encounter for screening for infections with a predominantly sexual mode of transmission: Secondary | ICD-10-CM

## 2019-10-15 DIAGNOSIS — M00051 Staphylococcal arthritis, right hip: Secondary | ICD-10-CM

## 2019-10-16 LAB — T-HELPER CELL (CD4) - (RCID CLINIC ONLY)
CD4 % Helper T Cell: 18 % — ABNORMAL LOW (ref 33–65)
CD4 T Cell Abs: 275 /uL — ABNORMAL LOW (ref 400–1790)

## 2019-10-18 LAB — COMPREHENSIVE METABOLIC PANEL
AG Ratio: 1.3 (calc) (ref 1.0–2.5)
ALT: 12 U/L (ref 9–46)
AST: 25 U/L (ref 10–35)
Albumin: 3.9 g/dL (ref 3.6–5.1)
Alkaline phosphatase (APISO): 60 U/L (ref 35–144)
BUN: 11 mg/dL (ref 7–25)
CO2: 26 mmol/L (ref 20–32)
Calcium: 9.2 mg/dL (ref 8.6–10.3)
Chloride: 108 mmol/L (ref 98–110)
Creat: 1.06 mg/dL (ref 0.70–1.33)
Globulin: 2.9 g/dL (calc) (ref 1.9–3.7)
Glucose, Bld: 71 mg/dL (ref 65–99)
Potassium: 4.5 mmol/L (ref 3.5–5.3)
Sodium: 142 mmol/L (ref 135–146)
Total Bilirubin: 0.6 mg/dL (ref 0.2–1.2)
Total Protein: 6.8 g/dL (ref 6.1–8.1)

## 2019-10-18 LAB — LIPID PANEL
Cholesterol: 184 mg/dL (ref <200)
HDL: 105 mg/dL (ref >=40)
LDL Cholesterol (Calc): 64 mg/dL (calc)
Non-HDL Cholesterol (Calc): 79 mg/dL (calc) (ref <130)
Total CHOL/HDL Ratio: 1.8 (calc) (ref <5.0)
Triglycerides: 74 mg/dL (ref <150)

## 2019-10-18 LAB — CBC
HCT: 39.8 % (ref 38.5–50.0)
Hemoglobin: 13.7 g/dL (ref 13.2–17.1)
MCH: 38 pg — ABNORMAL HIGH (ref 27.0–33.0)
MCHC: 34.4 g/dL (ref 32.0–36.0)
MCV: 110.2 fL — ABNORMAL HIGH (ref 80.0–100.0)
MPV: 9.7 fL (ref 7.5–12.5)
Platelets: 127 10*3/uL — ABNORMAL LOW (ref 140–400)
RBC: 3.61 10*6/uL — ABNORMAL LOW (ref 4.20–5.80)
RDW: 14.7 % (ref 11.0–15.0)
WBC: 6.4 10*3/uL (ref 3.8–10.8)

## 2019-10-18 LAB — HIV-1 RNA QUANT-NO REFLEX-BLD
HIV 1 RNA Quant: 385 Copies/mL — ABNORMAL HIGH
HIV-1 RNA Quant, Log: 2.59 Log cps/mL — ABNORMAL HIGH

## 2019-10-18 LAB — RPR: RPR Ser Ql: NONREACTIVE

## 2019-10-18 LAB — C-REACTIVE PROTEIN: CRP: 4.9 mg/L (ref <8.0)

## 2019-10-18 LAB — SEDIMENTATION RATE: Sed Rate: 9 mm/h (ref 0–20)

## 2019-10-28 ENCOUNTER — Encounter: Payer: Medicare Other | Admitting: Infectious Diseases

## 2019-11-25 ENCOUNTER — Ambulatory Visit (INDEPENDENT_AMBULATORY_CARE_PROVIDER_SITE_OTHER): Payer: Medicare Other | Admitting: Infectious Diseases

## 2019-11-25 ENCOUNTER — Other Ambulatory Visit: Payer: Self-pay

## 2019-11-25 ENCOUNTER — Encounter: Payer: Self-pay | Admitting: Infectious Diseases

## 2019-11-25 VITALS — BP 163/94 | HR 65 | Temp 98.5°F | Wt 151.0 lb

## 2019-11-25 DIAGNOSIS — K089 Disorder of teeth and supporting structures, unspecified: Secondary | ICD-10-CM | POA: Diagnosis not present

## 2019-11-25 DIAGNOSIS — Z113 Encounter for screening for infections with a predominantly sexual mode of transmission: Secondary | ICD-10-CM | POA: Diagnosis not present

## 2019-11-25 DIAGNOSIS — C210 Malignant neoplasm of anus, unspecified: Secondary | ICD-10-CM | POA: Diagnosis not present

## 2019-11-25 DIAGNOSIS — Z23 Encounter for immunization: Secondary | ICD-10-CM | POA: Diagnosis not present

## 2019-11-25 DIAGNOSIS — B20 Human immunodeficiency virus [HIV] disease: Secondary | ICD-10-CM

## 2019-11-25 DIAGNOSIS — Z79899 Other long term (current) drug therapy: Secondary | ICD-10-CM

## 2019-11-25 NOTE — Progress Notes (Signed)
   Subjective:    Patient ID: [name] [name], male    DOB: 1960-12-28, 59 y.o.   MRN: [id number]  HPI 59yo M with HIV/AIDS, and rectal CA (2008). He had Onc f/u on 05-2016 and was to have prn f/u. Current meds- Prezcobix/edurant/tivicayqday.    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 He underwent resection of his R hip and debridement of his decubitus ulcer. His Cx grew group B Strep.  He underwent R THR on 06-15-14. Was placed on doxy post-op.  He was seen in ID clinic on 03-10-15 and complained of swelling over his L hip. Marland Kitchen He was seen by ortho on 3-2 and had R THR joint aspirate. He had molecular test (synovasure) done on fluid which revealed streptococci. He was adm to hospital on 3-6 and underwent I &D and poly-exchange.He has been taking levaquin chronically since 07-2015. He has some pain in his hips, after being up, standing for prolonged periods.   Has diarrhea every 2-3 weeks, missed 2-3 doses last couple of weeks (to see if this helps his loose BM). He has not changed his diet. Takes imodium to get diarrhea in check (which works).  His bottom has o/w been fine. Needs colonoscopy.   Will get COVID booster 11-27-19  HIV 1 RNA Quant  Date Value  10/15/2019 385 Copies/mL (H)  11/19/2018 410 copies/mL (H)  12/31/2017 24 copies/mL (H)   CD4 T Cell Abs (/uL)  Date Value  10/15/2019 275 (L)  11/19/2018 406  12/31/2017 210 (L)    Review of Systems  Constitutional: Negative for appetite change and unexpected weight change.  HENT: Positive for postnasal drip.   Respiratory: Negative for cough and shortness of breath.   Cardiovascular: Negative for chest pain.  Gastrointestinal: Positive for diarrhea. Negative for blood in stool (blood on stool) and constipation.  Genitourinary: Negative for difficulty urinating.  Neurological: Negative for headaches.       Objective:   Physical Exam Vitals reviewed.  Constitutional:       Appearance: Normal appearance.  HENT:     Mouth/Throat:     Mouth: Mucous membranes are moist.     Pharynx: No oropharyngeal exudate.  Eyes:     Extraocular Movements: Extraocular movements intact.     Pupils: Pupils are equal, round, and reactive to light.  Cardiovascular:     Rate and Rhythm: Normal rate and regular rhythm.  Pulmonary:     Effort: Pulmonary effort is normal.     Breath sounds: Normal breath sounds.  Abdominal:     General: Bowel sounds are normal. There is no distension.     Palpations: Abdomen is soft.     Tenderness: There is no abdominal tenderness.  Musculoskeletal:        General: Normal range of motion.     Cervical back: Normal range of motion and neck supple.     Right lower leg: No edema.     Left lower leg: No edema.  Neurological:     General: No focal deficit present.     Mental Status: He is alert.  Psychiatric:        Mood and Affect: Mood normal.           Assessment & Plan:

## 2019-11-25 NOTE — Assessment & Plan Note (Signed)
He appears to be doing well.  Will check his HIV RNA and genotype today with his missed meds.  covid booster 11-19 Flu vax today rtc 6 months.  No change in meds for now.

## 2019-11-25 NOTE — Assessment & Plan Note (Signed)
Dental referral placed today for [facility]. Information to schedule appointment completed today.   

## 2019-11-25 NOTE — Assessment & Plan Note (Signed)
He needs colon and his c/o brbpr are concerning.  Will have him seen by GI.

## 2019-11-26 LAB — T-HELPER CELL (CD4) - (RCID CLINIC ONLY)
CD4 % Helper T Cell: 18 % — ABNORMAL LOW (ref 33–65)
CD4 T Cell Abs: 207 /uL — ABNORMAL LOW (ref 400–1790)

## 2019-11-28 ENCOUNTER — Ambulatory Visit (INDEPENDENT_AMBULATORY_CARE_PROVIDER_SITE_OTHER): Payer: Medicare Other

## 2019-11-28 ENCOUNTER — Other Ambulatory Visit: Payer: Self-pay

## 2019-11-28 DIAGNOSIS — Z23 Encounter for immunization: Secondary | ICD-10-CM | POA: Diagnosis not present

## 2019-11-28 NOTE — Progress Notes (Signed)
° °  Covid-19 Vaccination Clinic  Name:  [name] [name]    MRN: [id number] DOB: [birth year]  11/28/2019  Mr. [name] was observed post Covid-19 immunization for 15 minutes without incident. He was provided with Vaccine Information Sheet and instruction to access the V-Safe system.   Mr. [name] was instructed to call 911 with any severe reactions post vaccine:  Difficulty breathing   Swelling of face and throat   A fast heartbeat   A bad rash all over body   Dizziness and weakness   Immunizations Administered    Name Date Dose VIS Date [place] COVID-19 Vaccine 11/28/2019 11:29 AM 0.3 mL 10/29/2019 Intramuscular   Manufacturer: [place]   Lot: [id number]   NDC: [address]-3     Landis Gandy, RN

## 2019-12-01 ENCOUNTER — Telehealth: Payer: Self-pay

## 2019-12-01 ENCOUNTER — Other Ambulatory Visit: Payer: Self-pay

## 2019-12-01 ENCOUNTER — Ambulatory Visit: Payer: Medicare Other

## 2019-12-01 ENCOUNTER — Encounter: Payer: Self-pay | Admitting: Infectious Diseases

## 2019-12-01 NOTE — Telephone Encounter (Signed)
[facility] Patient Advocate Encounter   I was successful in securing patient a $7500 grant from [employer] [place] (PAF) to provide copayment coverage for [place].  This will make the out of pocket cost $0.00.     I have spoken with the patient.      RxBin: [id number] PCN:   [id number] Member ID: [id number] Group ID: [id number] Dates of Eligibility: 12/01/19 through 11/30/20  Patient knows to call the office with questions or concerns.  Ileene Patrick, [facility] for Infectious Disease Phone: [phone] Fax:  [phone]

## 2019-12-07 LAB — HIV-1 INTEGRASE GENOTYPE

## 2019-12-07 LAB — HIV-1 GENOTYPE: HIV-1 Genotype: DETECTED — AB

## 2019-12-07 LAB — HIV-1 RNA ULTRAQUANT REFLEX TO GENTYP+
HIV 1 RNA Quant: 1020 copies/mL — ABNORMAL HIGH
HIV-1 RNA Quant, Log: 3.01 Log copies/mL — ABNORMAL HIGH

## 2020-01-01 ENCOUNTER — Other Ambulatory Visit: Payer: Self-pay | Admitting: Infectious Diseases

## 2020-01-01 DIAGNOSIS — B20 Human immunodeficiency virus [HIV] disease: Secondary | ICD-10-CM

## 2020-01-14 ENCOUNTER — Telehealth: Payer: Self-pay

## 2020-01-14 NOTE — Telephone Encounter (Signed)
Dr. Lavon Paganini:  In working up pt for PV-pt has had a complicated past.  Please review Oncology note for 05/09/2016 and Infectious Disease 11/25/2019.  Referral is for malignant neoplasm in anus-but with no GI care or colonoscopy found.  Not sure if you want to do pt as a direct or have an office visit to review his current situation.    PV 1/14     Procedure 1/28  Thank you

## 2020-01-15 NOTE — Telephone Encounter (Signed)
Please schedule office visit with APP soon. Thanks

## 2020-01-15 NOTE — Telephone Encounter (Signed)
Per Dr. Nandigam-attempted to reach pt to schedule an OV.  No answer.  Left message for pt to call back to schedule the office visit.  Cancelled PV and Procedure until pt can call to reschedule.

## 2020-01-15 NOTE — Telephone Encounter (Signed)
I have some openings available.  Called his phone. No answer. Left a message to call me back asap about his appointments.

## 2020-01-20 NOTE — Telephone Encounter (Signed)
Patient is scheduled for OV 01/23/20

## 2020-01-23 ENCOUNTER — Ambulatory Visit: Payer: Medicare Other | Admitting: Gastroenterology

## 2020-02-06 ENCOUNTER — Telehealth: Payer: Self-pay

## 2020-02-06 ENCOUNTER — Encounter: Payer: Medicare Other | Admitting: Gastroenterology

## 2020-02-06 NOTE — Telephone Encounter (Signed)
Received discharge notice from pharmacy due to unable to reach patient to schedule delivery. Sent patient a Mychart meesage to alert him to contact pharmacy. Eugenia Mcalpine

## 2020-02-20 ENCOUNTER — Ambulatory Visit (INDEPENDENT_AMBULATORY_CARE_PROVIDER_SITE_OTHER): Payer: Medicare Other | Admitting: Gastroenterology

## 2020-02-20 ENCOUNTER — Encounter: Payer: Self-pay | Admitting: Gastroenterology

## 2020-02-20 VITALS — BP 118/72 | HR 76 | Ht 74.0 in | Wt 143.6 lb

## 2020-02-20 DIAGNOSIS — K625 Hemorrhage of anus and rectum: Secondary | ICD-10-CM | POA: Diagnosis not present

## 2020-02-20 DIAGNOSIS — Z85048 Personal history of other malignant neoplasm of rectum, rectosigmoid junction, and anus: Secondary | ICD-10-CM | POA: Diagnosis not present

## 2020-02-20 DIAGNOSIS — R6889 Other general symptoms and signs: Secondary | ICD-10-CM | POA: Insufficient documentation

## 2020-02-20 NOTE — Progress Notes (Signed)
02/20/2020 [name] [name] [id number] November 23, 1960   HISTORY OF PRESENT ILLNESS: This is a 60 year old male who has past medical history of HIV currently followed by [name] of infectious disease, anal cancer diagnosed in 2008 status post concurrent chemotherapy and radiation who was previously followed by Dr. Benay Spice, history of DVT, right iliopsoas abscess and septic right hip in 2015 for which he underwent surgical intervention and extensive period of IV antibiotics.  Still remains on Levaquin daily per [name] for prevention of reinfection.  Nonetheless, he was referred here by [name] for complaints of rectal bleeding and to have colonoscopy.  Has never had one in the past.  He complains of a lot of pain in his bottom.  He says that his stools are very deformed, flat, and pancake-like.    Past Medical History:  Diagnosis Date  . Arthritis   . Decubitus ulcer of sacral area   . Exposure to hepatitis B   . H/O hypotension   . History of gout   . History of transfusion   . 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 HIV (human immunodeficiency virus infection) ([place])   . Hx of sepsis 2015   affected rt hip / femur  . Pain in limb 07/23/2013  . Radiation   . 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 Shingles    left shoulder, right leg   Past Surgical History:  Procedure Laterality Date  . CONVERSION TO TOTAL HIP Right 06/15/2014   Procedure: REMOVAL OF ANTIBIOTIC  SPACER AND CONVERSION TO RIGHT TOTAL HIP  ARTHROPLASTY ;  Surgeon: Paralee Cancel, MD;  Location: [facility];  Service: Orthopedics;  Laterality: Right;  . CYSTOSCOPY    . HEMATOMA EVACUATION    . HERNIA REPAIR     right X2  . INCISION AND DRAINAGE HIP Right 07/31/2013   Procedure: IRRIGATION AND DEBRIDEMENT HIP WITH PERCUTANEOUS DRAIN PLACEMENT;  Surgeon: [name], MD;  Location: [place];  Service: Orthopedics;  Laterality: Right;  . INCISION AND DRAINAGE HIP Right 03/15/2015   Procedure:  IRRIGATION AND DEBRIDEMENT RIGHT HIP WITH  HEAD BALL  AND POLY LINER EXCHANGE;  Surgeon: Paralee Cancel, MD;  Location: [facility];  Service: Orthopedics;  Laterality: Right;  . INCISION AND DRAINAGE OF WOUND N/A 08/25/2013   Procedure: IRRIGATION AND DEBRIDEMENT OF SACRAL ULCER WITH PLACEMENT OF A CELL ;  Surgeon: Theodoro Kos, DO;  Location: [place];  Service: Plastics;  Laterality: N/A;  . INCISION AND DRAINAGE OF WOUND N/A 09/01/2013   Procedure: IRRIGATION AND DEBRIDEMENT SACRAL ULCER WITH PLACEMENT OF A CELL;  Surgeon: Theodoro Kos, DO;  Location: [place];  Service: Plastics;  Laterality: N/A;  . INCISION AND DRAINAGE OF WOUND N/A 09/08/2013   Procedure: IRRIGATION AND DEBRIDEMENT OF SACRAL ULCER WITH PLACEMENT OF A CELL AND VAC;  Surgeon: Theodoro Kos, DO;  Location: [place];  Service: Plastics;  Laterality: N/A;  . INCISION AND DRAINAGE OF WOUND N/A 09/18/2013   Procedure: IRRIGATION AND DEBRIDEMENT OF SACRAL WOUND WITH PLACEMENT OF A-CELL;  Surgeon: Theodoro Kos, DO;  Location: [place];  Service: Plastics;  Laterality: N/A;  . PILONIDAL CYST DRAINAGE N/A 08/09/2013   Procedure: IRRIGATION AND DEBRIDEMENT SACRAL DECUBITUS;  Surgeon: Harl Bowie, MD;  Location: [place];  Service: General;  Laterality: N/A;  . TOTAL HIP ARTHROPLASTY Right 08/04/2013   Procedure: RESECTION HIP JOINT - PLACEMENT OF CEMENT PROSTHESIS;  Surgeon: Rodman Key  Marian Sorrow, MD;  Location: [place];  Service: Orthopedics;  Laterality: Right;  . TRANSURETHRAL RESECTION OF PROSTATE  JAN 2016    reports that he has been smoking cigarettes. He has been smoking about 0.30 packs per day. He has never used smokeless tobacco. He reports current alcohol use of about 3.0 standard drinks of alcohol per week. He reports that he does not use drugs. family history includes Atrial fibrillation in his mother; Cancer in his father; Dementia in his mother; Diabetes in his mother; Emphysema in his father; Hypertension in his mother. Allergies  Allergen  Reactions  . Collagen Rash    redness      Outpatient Encounter Medications as of 02/20/2020  Medication Sig  . darunavir-cobicistat (PREZCOBIX) 800-150 MG tablet TAKE ONE TABLET BY MOUTH DAILY . SWALLOW WHOLE, DO NOT CRUSH, BREAK, OR CHEW TABLETS. TAKE WITH FOOD  . levofloxacin (LEVAQUIN) 500 MG tablet TAKE 1 TABLET(500 MG) BY MOUTH DAILY  . rilpivirine (EDURANT) 25 MG TABS tablet TAKE ONE TABLET (25 MG TOTAL) BY MOUTH DAILY WITH BREAKFAST  . TIVICAY 50 MG tablet TAKE ONE TABLET (50 MG TOTAL) BY MOUTH DAILY  . vitamin C (ASCORBIC ACID) 500 MG tablet Take 1,000 mg by mouth daily.    No facility-administered encounter medications on file as of 02/20/2020.    REVIEW OF SYSTEMS  : All other systems reviewed and negative except where noted in the History of Present Illness.   PHYSICAL EXAM: BP 118/72   Pulse 76   Ht 6\' 2"  (1.88 m)   Wt 143 lb 9.6 oz (65.1 kg)   BMI 18.44 kg/m  General: Well developed white male in no acute distress Head: Normocephalic and atraumatic Eyes: Sclerae anicteric, conjunctiva pink. Ears: Normal auditory acuity Lungs: Clear throughout to auscultation; no W/R/R. Heart: Regular rate and rhythm; no M/R/G. Abdomen: Soft, non-distended.  BS present.  Non-tender.  Laparotomy scar noted in his mid abdomen and right inguinal scar noted as well. Rectal: He has significant scar tissue and firm skin changes in the decubitus area, gluteal cleft, both buttocks.  There was significant ulcerations and nodularity in the perianal area.  Digital rectal exam was not performed due to such these abnormalities and discomfort. Musculoskeletal: Symmetrical with no gross deformities  Skin: No lesions on visible extremities Extremities: No edema  Neurological: Alert oriented x 4, grossly non-focal Psychological:  Alert and cooperative. Normal mood and affect  ASSESSMENT AND PLAN: *60 year old male with history of anal cancer in 2008 with concurrent chemotherapy and radiation who  presents here with complaints of rectal bleeding and need for colonoscopy.  On exam today he has significant abnormalities on his rectal exam including nodularity and ulcerations of the perianal area.  Question recurrence of his malignancy versus radiation changes versus viral infection such as CMV or HSV in the setting of his known HIV status.  We are deferring colonoscopy for now I am referring to colorectal surgery.  Likely will need exam under anesthesia, external biopsies, etc.  I discussed with Dr. Bryan Lemma who also examined the patient.   CC:  [name], MD

## 2020-02-20 NOTE — Patient Instructions (Addendum)
If you are age 59 or older, your body mass index should be between 23-30. Your Body mass index is 18.44 kg/m. If this is out of the aforementioned range listed, please consider follow up with your Primary Care Provider.  If you are age 39 or younger, your body mass index should be between 19-25. Your Body mass index is 18.44 kg/m. If this is out of the aformentioned range listed, please consider follow up with your Primary Care Provider.   Referral placed to [facility] [address], [place], [place] [address] [phone]

## 2020-02-20 NOTE — Progress Notes (Signed)
Agree with the assessment and plan as outlined by Alonza Bogus, PA-C.  I also examined the patient with Janett Billow.  Perianal exam with significant induration, ulcerated mucosa with tenderness to palpation.  Agree with plan for referral to Colorectal Surgery for evaluation and possible EUA. Otherwise, feel that bowel prep right now would be significantly painful for the patient. Pending evaluation at Colorectal Surgery, can then decide on optimal timing for colonoscopy as appropriate.  Discussed with the patient at length and he agrees.  Okie Jansson, DO, [facility]

## 2020-02-23 ENCOUNTER — Telehealth: Payer: Self-pay | Admitting: *Deleted

## 2020-02-23 NOTE — Addendum Note (Signed)
Addended by: Horris Latino on: 02/23/2020 08:24 AM   Modules accepted: Orders

## 2020-02-23 NOTE — Telephone Encounter (Signed)
Referral placed to [facility].

## 2020-03-23 ENCOUNTER — Telehealth: Payer: Self-pay | Admitting: Nurse Practitioner

## 2020-03-23 NOTE — Telephone Encounter (Signed)
Received a new pt referral from Dr. Dema Severin for rectal bleeding. Pt has been cld and scheduled to see Ned Card on 3/23 at 10:15am. Pt aware to arrive 15 minutes early.

## 2020-03-26 ENCOUNTER — Ambulatory Visit: Payer: Self-pay | Admitting: Surgery

## 2020-03-26 ENCOUNTER — Other Ambulatory Visit: Payer: Self-pay

## 2020-03-26 ENCOUNTER — Encounter (HOSPITAL_COMMUNITY): Payer: Self-pay | Admitting: Surgery

## 2020-03-26 ENCOUNTER — Other Ambulatory Visit (HOSPITAL_COMMUNITY)
Admission: RE | Admit: 2020-03-26 | Discharge: 2020-03-26 | Disposition: A | Discharge status: Home / Self Care | Payer: Medicare Other | Source: Ambulatory Visit | Attending: Surgery | Admitting: Surgery

## 2020-03-26 DIAGNOSIS — Z20822 Contact with and (suspected) exposure to covid-19: Secondary | ICD-10-CM | POA: Diagnosis not present

## 2020-03-26 DIAGNOSIS — Z01812 Encounter for preprocedural laboratory examination: Secondary | ICD-10-CM | POA: Insufficient documentation

## 2020-03-26 NOTE — H&P (View-Only) (Signed)
CC: Referred by GI for possible anal mass  HPI: [name] [name] is a very pleasant 89yoM with hx of HIV (last Cd4 207), anal cancer ([facility]) s/p cXRT 2008 with remission noted as recent as 2018 - presents to our office for evaluation of a possible anal mass. He was recently seen by gastroenterology and referred to our office. He reports that for approximately 2 years he had progressive symptoms. Nothing is acutely changed. He notes nodules and masses that fluctuate in size. He denies any weight loss. He reports everything has been stable. He reports that he has had some perianal pain that is not going on for approximately 2 years. He has never had a colonoscopy. He tries to take his HIV medication as prescribed but does occasionally miss doses. He follows with [name]. 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 he continues to smoke but reports he has dropped down to approximately 1 pack per week.  PMH: HIV (last Cd4 207), anal cancer ([facility]) s/p cXRT 2008  PSH: he reports he had an anal biopsy in 2008 but denies any other anorectal procedures or surgeries.  FHx: Denies FHx of colorectal, breast, endometrial, ovarian or cervical cancer  Social: Denies use of tobacco/EtOH/drugs  ROS: A comprehensive 10 system review of systems was completed with the patient and pertinent findings as noted above.  The patient is a 60 year old male.   Past Surgical History Illene Regulus, CMA; 03/23/2020 11:28 AM) Hip Surgery  Right. Open Inguinal Hernia Surgery  Right. multiple Ventral / Umbilical Hernia Surgery  Left.  Diagnostic Studies History Illene Regulus, CMA; 03/23/2020 11:28 AM) Colonoscopy  never  Allergies Lars Mage Spillers, CMA; 03/23/2020 11:30 AM) Collagen *DERMATOLOGICALS*   Medication History (Alisha Spillers, CMA; 03/23/2020 11:31 AM) levoFLOXacin (500MG  Tablet, Oral) Active. Prezcobix (800-150MG  Tablet, Oral)  Active. Edurant (25MG  Tablet, Oral) Active. Tivicay (50MG  Tablet, Oral) Active. Vitamin C (500MG  Capsule, Oral) Active. Medications Reconciled  Social History Illene Regulus, CMA; 03/23/2020 11:28 AM) Alcohol use  Moderate alcohol use. Caffeine use  Carbonated beverages. Illicit drug use  Recently quit drug use. Tobacco use  Current some day smoker.  Family History Illene Regulus, [place]; 03/23/2020 11:28 AM) Arthritis  Father, Mother. Cerebrovascular Accident  Family Members In General. Diabetes Mellitus  Mother. Heart Disease  Mother. Hypertension  Mother. Respiratory Condition  Father.  Other Problems Lars Mage Spillers, CMA; 03/23/2020 11:28 AM) Arthritis  Back Pain  Cancer  HIV-positive  Inguinal Hernia  Other disease, cancer, significant illness  Pulmonary Embolism / Blood Clot in Legs  Umbilical Hernia Repair     Review of Systems (Alisha Spillers CMA; 03/23/2020 11:28 AM) General Present- Fatigue. Not Present- Appetite Loss, Chills, Fever, Night Sweats, Weight Gain and Weight Loss. Skin Present- Ulcer. Not Present- Change in Wart/Mole, Dryness, Hives, Jaundice, New Lesions, Non-Healing Wounds and Rash. HEENT Present- Seasonal Allergies. Not Present- Earache, Hearing Loss, Hoarseness, Nose Bleed, Oral Ulcers, Ringing in the Ears, Sinus Pain, Sore Throat, Visual Disturbances, Wears glasses/contact lenses and Yellow Eyes. Respiratory Not Present- Bloody sputum, Chronic Cough, Difficulty Breathing, Snoring and Wheezing. Breast Not Present- Breast Mass, Breast Pain, Nipple Discharge and Skin Changes. Cardiovascular Not Present- Chest Pain, Difficulty Breathing Lying Down, Leg Cramps, Palpitations, Rapid Heart Rate, Shortness of Breath and Swelling of Extremities. Gastrointestinal Present- Bloody Stool and Rectal Pain. Not Present- Abdominal Pain, Bloating, Change in Bowel Habits, Chronic diarrhea, Constipation, Difficulty Swallowing, Excessive gas, Gets full  quickly at meals, Hemorrhoids, Indigestion,  Nausea and Vomiting. Male Genitourinary Not Present- Blood in Urine, Change in Urinary Stream, Frequency, Impotence, Nocturia, Painful Urination, Urgency and Urine Leakage. Musculoskeletal Not Present- Back Pain, Joint Pain, Joint Stiffness, Muscle Pain, Muscle Weakness and Swelling of Extremities. Neurological Not Present- Decreased Memory, Fainting, Headaches, Numbness, Seizures, Tingling, Tremor, Trouble walking and Weakness. Psychiatric Not Present- Anxiety, Bipolar, Change in Sleep Pattern, Depression, Fearful and Frequent crying. Endocrine Not Present- Cold Intolerance, Excessive Hunger, Hair Changes, Heat Intolerance, Hot flashes and New Diabetes. Hematology Present- HIV. Not Present- Blood Thinners, Easy Bruising, Excessive bleeding, Gland problems and Persistent Infections.  Vitals (Alisha Spillers CMA; 03/23/2020 11:30 AM) 03/23/2020 11:29 AM Weight: 140 lb Height: 74in Body Surface Area: 1.87 m Body Mass Index: 17.97 kg/m  Pulse: 96 (Regular)  BP: 128/82(Sitting, Left Arm, Standard)       Physical Exam Harrell Gave M. Riddhi Grether MD; 03/23/2020 12:10 PM) The physical exam findings are as follows: Note: Constitutional: No acute distress; conversant; no deformities Eyes: Moist conjunctiva; no lid lag; anicteric sclerae; pupils equal round and reactive to light Neck: Trachea midline; no palpable thyromegaly Lungs: Normal respiratory effort; no tactile fremitus CV: Regular rate and rhythm; no palpable thrill; no pitting edema GI: Abdomen soft, nontender, nondistended; no palpable hepatosplenomegaly Anorectal: Unable to clearly see anus; significant mass like tissue with fissuring and nodularity in both buttocks. Fixed. DRE/anoscopy unable to be performed due to mass and discomfort MSK: Normal gait; no clubbing/cyanosis Psychiatric: Appropriate affect; alert and oriented 3 Lymphatic: No palpable cervical or axillary  lymphadenopathy **A chaperone, Illene Regulus, was present for this encounter    Assessment & Plan Harrell Gave M. Cola Highfill MD; 03/23/2020 12:10 PM) MASS OF ANUS (K62.89) Story: [name] [name] is a very pleasant 60yoM with hx HIV (last Cd4 207); SCC of anus cXRT in 2008 - with probable recurrence based on exam Impression: -The anatomy and physiology of the anal canal was reviewed with him today. The pathophysiology of anal masses and cancer was discussed as well -Given appearance, discussed proceeding with anorectal exam under anesthesia and biopsy/excision of tissue for pathology evaluation -We discussed we suspect this is a recurrence of his anal cancer -We discussed importance of smoking cessation and harms of smoking from both recovery and further treatment. Additionally, compliance with HIV medication -The planned procedure, material risks (including, but not limited to, pain, bleeding, infection, scarring, need for additional procedures, failure to diagnose, pneumonia, heart attack, stroke, death) benefits and alternatives to surgery were discussed at length. We discussed this would not be curative but to obtain more information. The patient's questions were answered to his satisfaction, he voiced understanding and elected to proceed with surgery. Additionally, we discussed typical postoperative expectations and the recovery process.  This patient encounter took 50 minutes today to perform the following: take history, perform exam, review outside records, interpret imaging, counsel the patient on their diagnosis and document encounter, findings & plan in the EHR HISTORY OF ANAL CANCER (Z85.048)  Signed by Ileana Roup, MD (03/23/2020 12:11 PM)

## 2020-03-26 NOTE — H&P (Signed)
CC: Referred by GI for possible anal mass  HPI: [name] is a very pleasant 60yoM with hx of HIV (last Cd4 207), anal cancer (SCC) s/p cXRT 2008 with remission noted as recent as 2018 - presents to our office for evaluation of a possible anal mass. He was recently seen by gastroenterology and referred to our office. He reports that for approximately 2 years he had progressive symptoms. Nothing is acutely changed. He notes nodules and masses that fluctuate in size. He denies any weight loss. He reports everything has been stable. He reports that he has had some perianal pain that is not going on for approximately 2 years. He has never had a colonoscopy. He tries to take his HIV medication as prescribed but does occasionally miss doses. He follows with [name]. 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 he continues to smoke but reports he has dropped down to approximately 1 pack per week.  PMH: HIV (last Cd4 207), anal cancer (SCC) s/p cXRT 2008  PSH: he reports he had an anal biopsy in 2008 but denies any other anorectal procedures or surgeries.  FHx: Denies FHx of colorectal, breast, endometrial, ovarian or cervical cancer  Social: Denies use of tobacco/EtOH/drugs  ROS: A comprehensive 10 system review of systems was completed with the patient and pertinent findings as noted above.  The patient is a 60 year old male.   Past Surgical History Illene Regulus, CMA; 03/23/2020 11:28 AM) Hip Surgery  Right. Open Inguinal Hernia Surgery  Right. multiple Ventral / Umbilical Hernia Surgery  Left.  Diagnostic Studies History Illene Regulus, CMA; 03/23/2020 11:28 AM) Colonoscopy  never  Allergies Lars Mage Spillers, CMA; 03/23/2020 11:30 AM) Collagen *DERMATOLOGICALS*   Medication History (Alisha Spillers, CMA; 03/23/2020 11:31 AM) levoFLOXacin (500MG  Tablet, Oral) Active. Prezcobix (800-150MG  Tablet, Oral)  Active. Edurant (25MG  Tablet, Oral) Active. Tivicay (50MG  Tablet, Oral) Active. Vitamin C (500MG  Capsule, Oral) Active. Medications Reconciled  Social History Illene Regulus, CMA; 03/23/2020 11:28 AM) Alcohol use  Moderate alcohol use. Caffeine use  Carbonated beverages. Illicit drug use  Recently quit drug use. Tobacco use  Current some day smoker.  Family History Illene Regulus, [place]; 03/23/2020 11:28 AM) Arthritis  Father, Mother. Cerebrovascular Accident  Family Members In General. Diabetes Mellitus  Mother. Heart Disease  Mother. Hypertension  Mother. Respiratory Condition  Father.  Other Problems Lars Mage Spillers, CMA; 03/23/2020 11:28 AM) Arthritis  Back Pain  Cancer  HIV-positive  Inguinal Hernia  Other disease, cancer, significant illness  Pulmonary Embolism / Blood Clot in Legs  Umbilical Hernia Repair     Review of Systems (Alisha Spillers CMA; 03/23/2020 11:28 AM) General Present- Fatigue. Not Present- Appetite Loss, Chills, Fever, Night Sweats, Weight Gain and Weight Loss. Skin Present- Ulcer. Not Present- Change in Wart/Mole, Dryness, Hives, Jaundice, New Lesions, Non-Healing Wounds and Rash. HEENT Present- Seasonal Allergies. Not Present- Earache, Hearing Loss, Hoarseness, Nose Bleed, Oral Ulcers, Ringing in the Ears, Sinus Pain, Sore Throat, Visual Disturbances, Wears glasses/contact lenses and Yellow Eyes. Respiratory Not Present- Bloody sputum, Chronic Cough, Difficulty Breathing, Snoring and Wheezing. Breast Not Present- Breast Mass, Breast Pain, Nipple Discharge and Skin Changes. Cardiovascular Not Present- Chest Pain, Difficulty Breathing Lying Down, Leg Cramps, Palpitations, Rapid Heart Rate, Shortness of Breath and Swelling of Extremities. Gastrointestinal Present- Bloody Stool and Rectal Pain. Not Present- Abdominal Pain, Bloating, Change in Bowel Habits, Chronic diarrhea, Constipation, Difficulty Swallowing, Excessive gas, Gets full  quickly at meals, Hemorrhoids, Indigestion,  Nausea and Vomiting. Male Genitourinary Not Present- Blood in Urine, Change in Urinary Stream, Frequency, Impotence, Nocturia, Painful Urination, Urgency and Urine Leakage. Musculoskeletal Not Present- Back Pain, Joint Pain, Joint Stiffness, Muscle Pain, Muscle Weakness and Swelling of Extremities. Neurological Not Present- Decreased Memory, Fainting, Headaches, Numbness, Seizures, Tingling, Tremor, Trouble walking and Weakness. Psychiatric Not Present- Anxiety, Bipolar, Change in Sleep Pattern, Depression, Fearful and Frequent crying. Endocrine Not Present- Cold Intolerance, Excessive Hunger, Hair Changes, Heat Intolerance, Hot flashes and New Diabetes. Hematology Present- HIV. Not Present- Blood Thinners, Easy Bruising, Excessive bleeding, Gland problems and Persistent Infections.  Vitals (Alisha Spillers CMA; 03/23/2020 11:30 AM) 03/23/2020 11:29 AM Weight: 140 lb Height: 74in Body Surface Area: 1.87 m Body Mass Index: 17.97 kg/m  Pulse: 96 (Regular)  BP: 128/82(Sitting, Left Arm, Standard)       Physical Exam Harrell Gave M. Vestal Markin MD; 03/23/2020 12:10 PM) The physical exam findings are as follows: Note: Constitutional: No acute distress; conversant; no deformities Eyes: Moist conjunctiva; no lid lag; anicteric sclerae; pupils equal round and reactive to light Neck: Trachea midline; no palpable thyromegaly Lungs: Normal respiratory effort; no tactile fremitus CV: Regular rate and rhythm; no palpable thrill; no pitting edema GI: Abdomen soft, nontender, nondistended; no palpable hepatosplenomegaly Anorectal: Unable to clearly see anus; significant mass like tissue with fissuring and nodularity in both buttocks. Fixed. DRE/anoscopy unable to be performed due to mass and discomfort MSK: Normal gait; no clubbing/cyanosis Psychiatric: Appropriate affect; alert and oriented 3 Lymphatic: No palpable cervical or axillary  lymphadenopathy **A chaperone, Illene Regulus, was present for this encounter    Assessment & Plan Harrell Gave M. Yolette Hastings MD; 03/23/2020 12:10 PM) MASS OF ANUS (K62.89) Story: Mr. [name] is a very pleasant 60yoM with hx HIV (last Cd4 207); SCC of anus cXRT in 2008 - with probable recurrence based on exam Impression: -The anatomy and physiology of the anal canal was reviewed with him today. The pathophysiology of anal masses and cancer was discussed as well -Given appearance, discussed proceeding with anorectal exam under anesthesia and biopsy/excision of tissue for pathology evaluation -We discussed we suspect this is a recurrence of his anal cancer -We discussed importance of smoking cessation and harms of smoking from both recovery and further treatment. Additionally, compliance with HIV medication -The planned procedure, material risks (including, but not limited to, pain, bleeding, infection, scarring, need for additional procedures, failure to diagnose, pneumonia, heart attack, stroke, death) benefits and alternatives to surgery were discussed at length. We discussed this would not be curative but to obtain more information. The patient's questions were answered to his satisfaction, he voiced understanding and elected to proceed with surgery. Additionally, we discussed typical postoperative expectations and the recovery process.  This patient encounter took 50 minutes today to perform the following: take history, perform exam, review outside records, interpret imaging, counsel the patient on their diagnosis and document encounter, findings & plan in the EHR HISTORY OF ANAL CANCER (Z85.048)  Signed by Ileana Roup, MD (03/23/2020 12:11 PM)

## 2020-03-26 NOTE — Progress Notes (Addendum)
COVID Vaccine Completed:Yes Date COVID Vaccine completed: 04/04/19, 04/29/19 Has received booster: 11/28/19 COVID vaccine manufacturer: Pfizer      Date of COVID positive in last 90 days: No  PCP - [name], MD last office visit 11/25/19 in epic Cardiologist - N/A  Chest x-ray -  greater than 1 year in epic EKG - greater than 1 year in epic Stress Test - N/A ECHO - greater than 2 years in epic Cardiac Cath - N/A Pacemaker/ICD device last checked:N/A  Sleep Study - N/A CPAP - N/A  Fasting Blood Sugar - N/A Checks Blood Sugar __N/A___ times a day  Blood Thinner Instructions: N/A Aspirin Instructions: N/A Last Dose: N/A  Activity level:  Can go up a flight of stairs and activities of daily living without stopping and without symptoms      Anesthesia review: N/A  Patient denies shortness of breath, fever, cough and chest pain at PAT appointment   Patient verbalized understanding of instructions that were given to them at the PAT appointment. Patient was also instructed that they will need to review over the PAT instructions again at home before surgery.

## 2020-03-27 LAB — SARS CORONAVIRUS 2 (TAT 6-24 HRS): SARS Coronavirus 2: NEGATIVE

## 2020-03-28 MED ORDER — BUPIVACAINE LIPOSOME 1.3 % IJ SUSP
20.0000 mL | Freq: Once | INTRAMUSCULAR | Status: DC
  Filled 2020-03-28: qty 20

## 2020-03-29 ENCOUNTER — Ambulatory Visit (HOSPITAL_COMMUNITY): Payer: Medicare Other | Admitting: Certified Registered Nurse Anesthetist

## 2020-03-29 ENCOUNTER — Encounter (HOSPITAL_COMMUNITY)
Admission: RE | Disposition: A | Discharge status: Home / Self Care | Payer: Self-pay | Source: Home / Self Care | Attending: Surgery

## 2020-03-29 ENCOUNTER — Encounter (HOSPITAL_COMMUNITY): Payer: Self-pay | Admitting: Surgery

## 2020-03-29 ENCOUNTER — Ambulatory Visit (HOSPITAL_COMMUNITY)
Admission: RE | Admit: 2020-03-29 | Discharge: 2020-03-29 | Disposition: A | Discharge status: Home / Self Care | Payer: Medicare Other | Attending: Surgery | Admitting: Surgery

## 2020-03-29 DIAGNOSIS — Z21 Asymptomatic human immunodeficiency virus [HIV] infection status: Secondary | ICD-10-CM | POA: Insufficient documentation

## 2020-03-29 DIAGNOSIS — Z86711 Personal history of pulmonary embolism: Secondary | ICD-10-CM | POA: Insufficient documentation

## 2020-03-29 DIAGNOSIS — Z888 Allergy status to other drugs, medicaments and biological substances status: Secondary | ICD-10-CM | POA: Insufficient documentation

## 2020-03-29 DIAGNOSIS — L98419 Non-pressure chronic ulcer of buttock with unspecified severity: Secondary | ICD-10-CM | POA: Insufficient documentation

## 2020-03-29 DIAGNOSIS — F17200 Nicotine dependence, unspecified, uncomplicated: Secondary | ICD-10-CM | POA: Diagnosis not present

## 2020-03-29 DIAGNOSIS — K6289 Other specified diseases of anus and rectum: Secondary | ICD-10-CM | POA: Insufficient documentation

## 2020-03-29 HISTORY — DX: Other specified postprocedural states: R11.2

## 2020-03-29 HISTORY — PX: RECTAL BIOPSY: SHX2303

## 2020-03-29 HISTORY — DX: Personal history of diseases of the blood and blood-forming organs and certain disorders involving the immune mechanism: Z86.2

## 2020-03-29 HISTORY — DX: Other specified postprocedural states: Z98.890

## 2020-03-29 LAB — COMPREHENSIVE METABOLIC PANEL
ALT: 21 U/L (ref 0–44)
AST: 37 U/L (ref 15–41)
Albumin: 3.5 g/dL (ref 3.5–5.0)
Alkaline Phosphatase: 78 U/L (ref 38–126)
Anion gap: 10 (ref 5–15)
BUN: 11 mg/dL (ref 6–20)
CO2: 23 mmol/L (ref 22–32)
Calcium: 8.5 mg/dL — ABNORMAL LOW (ref 8.9–10.3)
Chloride: 102 mmol/L (ref 98–111)
Creatinine, Ser: 1.16 mg/dL (ref 0.61–1.24)
GFR, Estimated: >60 mL/min (ref >60)
Glucose, Bld: 83 mg/dL (ref 70–99)
Potassium: 4.1 mmol/L (ref 3.5–5.1)
Sodium: 135 mmol/L (ref 135–145)
Total Bilirubin: 1.7 mg/dL — ABNORMAL HIGH (ref 0.3–1.2)
Total Protein: 7.2 g/dL (ref 6.5–8.1)

## 2020-03-29 LAB — CBC
HCT: 38.3 % — ABNORMAL LOW (ref 39.0–52.0)
Hemoglobin: 13.1 g/dL (ref 13.0–17.0)
MCH: 38.2 pg — ABNORMAL HIGH (ref 26.0–34.0)
MCHC: 34.2 g/dL (ref 30.0–36.0)
MCV: 111.7 fL — ABNORMAL HIGH (ref 80.0–100.0)
Platelets: 111 10*3/uL — ABNORMAL LOW (ref 150–400)
RBC: 3.43 MIL/uL — ABNORMAL LOW (ref 4.22–5.81)
RDW: 14.7 % (ref 11.5–15.5)
WBC: 4.9 10*3/uL (ref 4.0–10.5)
nRBC: 0 % (ref 0.0–0.2)

## 2020-03-29 SURGERY — EXAM UNDER ANESTHESIA
Anesthesia: Monitor Anesthesia Care

## 2020-03-29 MED ORDER — ACETAMINOPHEN 160 MG/5ML PO SOLN: 325.0000 mg | ORAL | Status: DC | PRN

## 2020-03-29 MED ORDER — DEXAMETHASONE SODIUM PHOSPHATE 10 MG/ML IJ SOLN
INTRAMUSCULAR | Status: AC
  Filled 2020-03-29: qty 1

## 2020-03-29 MED ORDER — FENTANYL CITRATE (PF) 100 MCG/2ML IJ SOLN: 25.0000 ug | INTRAMUSCULAR | Status: DC | PRN

## 2020-03-29 MED ORDER — DEXAMETHASONE SODIUM PHOSPHATE 10 MG/ML IJ SOLN
INTRAMUSCULAR | Status: DC | PRN
  Administered 2020-03-29: 4 mg via INTRAVENOUS

## 2020-03-29 MED ORDER — MIDAZOLAM HCL 2 MG/2ML IJ SOLN
INTRAMUSCULAR | Status: AC
  Filled 2020-03-29: qty 2

## 2020-03-29 MED ORDER — FENTANYL CITRATE (PF) 100 MCG/2ML IJ SOLN
INTRAMUSCULAR | Status: DC | PRN
  Administered 2020-03-29 (×2): 25 ug via INTRAVENOUS

## 2020-03-29 MED ORDER — PROPOFOL 500 MG/50ML IV EMUL
INTRAVENOUS | Status: DC | PRN
  Administered 2020-03-29: 100 ug/kg/min via INTRAVENOUS

## 2020-03-29 MED ORDER — ORAL CARE MOUTH RINSE: 15.0000 mL | Freq: Once | OROMUCOSAL | Status: AC

## 2020-03-29 MED ORDER — KETOROLAC TROMETHAMINE 15 MG/ML IJ SOLN
INTRAMUSCULAR | Status: AC
  Filled 2020-03-29: qty 1

## 2020-03-29 MED ORDER — TRAMADOL HCL 50 MG PO TABS: 50.0000 mg | ORAL_TABLET | Freq: Four times a day (QID) | ORAL | 0 refills | Status: AC | PRN

## 2020-03-29 MED ORDER — MIDAZOLAM HCL 5 MG/5ML IJ SOLN
INTRAMUSCULAR | Status: DC | PRN
  Administered 2020-03-29: 2 mg via INTRAVENOUS

## 2020-03-29 MED ORDER — ACETAMINOPHEN 325 MG PO TABS: 325.0000 mg | ORAL_TABLET | ORAL | Status: DC | PRN

## 2020-03-29 MED ORDER — OXYCODONE HCL 5 MG PO TABS: 5.0000 mg | ORAL_TABLET | Freq: Once | ORAL | Status: DC | PRN

## 2020-03-29 MED ORDER — BUPIVACAINE-EPINEPHRINE (PF) 0.25% -1:200000 IJ SOLN
INTRAMUSCULAR | Status: DC | PRN
  Administered 2020-03-29: 1.5 mL

## 2020-03-29 MED ORDER — ACETAMINOPHEN 500 MG PO TABS
ORAL_TABLET | ORAL | Status: AC
  Administered 2020-03-29: 1000 mg via ORAL
  Filled 2020-03-29: qty 2

## 2020-03-29 MED ORDER — BUPIVACAINE LIPOSOME 1.3 % IJ SUSP
INTRAMUSCULAR | Status: DC | PRN
  Administered 2020-03-29: 1.5 mL

## 2020-03-29 MED ORDER — ACETAMINOPHEN 500 MG PO TABS: 1000.0000 mg | ORAL_TABLET | ORAL | Status: AC

## 2020-03-29 MED ORDER — CHLORHEXIDINE GLUCONATE CLOTH 2 % EX PADS: 6.0000 | MEDICATED_PAD | Freq: Once | CUTANEOUS | Status: DC

## 2020-03-29 MED ORDER — MEPERIDINE HCL 50 MG/ML IJ SOLN: 6.2500 mg | INTRAMUSCULAR | Status: DC | PRN

## 2020-03-29 MED ORDER — BUPIVACAINE-EPINEPHRINE (PF) 0.25% -1:200000 IJ SOLN
INTRAMUSCULAR | Status: AC
  Filled 2020-03-29: qty 30

## 2020-03-29 MED ORDER — BACITRACIN-NEOMYCIN-POLYMYXIN OINTMENT TUBE
TOPICAL_OINTMENT | CUTANEOUS | Status: AC
  Filled 2020-03-29: qty 14.17

## 2020-03-29 MED ORDER — CHLORHEXIDINE GLUCONATE 0.12 % MT SOLN
15.0000 mL | Freq: Once | OROMUCOSAL | Status: AC
  Administered 2020-03-29: 15 mL via OROMUCOSAL

## 2020-03-29 MED ORDER — KETOROLAC TROMETHAMINE 15 MG/ML IJ SOLN
15.0000 mg | Freq: Once | INTRAMUSCULAR | Status: AC
  Administered 2020-03-29: 15 mg via INTRAVENOUS

## 2020-03-29 MED ORDER — ONDANSETRON HCL 4 MG/2ML IJ SOLN: 4.0000 mg | Freq: Once | INTRAMUSCULAR | Status: DC | PRN

## 2020-03-29 MED ORDER — LIDOCAINE 2% (20 MG/ML) 5 ML SYRINGE
INTRAMUSCULAR | Status: DC | PRN
  Administered 2020-03-29: 40 mg via INTRAVENOUS

## 2020-03-29 MED ORDER — FENTANYL CITRATE (PF) 100 MCG/2ML IJ SOLN
INTRAMUSCULAR | Status: AC
  Filled 2020-03-29: qty 2

## 2020-03-29 MED ORDER — PROPOFOL 10 MG/ML IV BOLUS
INTRAVENOUS | Status: AC
  Filled 2020-03-29: qty 20

## 2020-03-29 MED ORDER — ONDANSETRON HCL 4 MG/2ML IJ SOLN
INTRAMUSCULAR | Status: DC | PRN
  Administered 2020-03-29: 4 mg via INTRAVENOUS

## 2020-03-29 MED ORDER — 0.9 % SODIUM CHLORIDE (POUR BTL) OPTIME
TOPICAL | Status: DC | PRN
  Administered 2020-03-29: 1000 mL

## 2020-03-29 MED ORDER — LACTATED RINGERS IV SOLN: INTRAVENOUS | Status: DC

## 2020-03-29 MED ORDER — OXYCODONE HCL 5 MG/5ML PO SOLN
5.0000 mg | Freq: Once | ORAL | Status: DC | PRN
Start: 2020-03-29 — End: 2020-03-29

## 2020-03-29 MED ORDER — ONDANSETRON HCL 4 MG/2ML IJ SOLN
INTRAMUSCULAR | Status: AC
  Filled 2020-03-29: qty 2

## 2020-03-29 MED ORDER — PROPOFOL 10 MG/ML IV BOLUS
INTRAVENOUS | Status: DC | PRN
  Administered 2020-03-29 (×6): 10 mg via INTRAVENOUS
  Administered 2020-03-29: 20 mg via INTRAVENOUS

## 2020-03-29 SURGICAL SUPPLY — 24 items
BRIEF STRETCH FOR OB PAD LRG (UNDERPADS AND DIAPERS) ×2 IMPLANT
DRSG PAD ABDOMINAL 8X10 ST (GAUZE/BANDAGES/DRESSINGS) IMPLANT
ELECT REM PT RETURN 15FT ADLT (MISCELLANEOUS) ×2 IMPLANT
GAUZE SPONGE 4X4 12PLY STRL (GAUZE/BANDAGES/DRESSINGS) ×1 IMPLANT
GLOVE SURG ENC MOIS LTX SZ7.5 (GLOVE) ×2 IMPLANT
GLOVE SURG UNDER LTX SZ8 (GLOVE) ×2 IMPLANT
GOWN STRL REUS W/TWL XL LVL3 (GOWN DISPOSABLE) ×4 IMPLANT
KIT BASIN OR (CUSTOM PROCEDURE TRAY) ×2 IMPLANT
KIT TURNOVER KIT A (KITS) ×2 IMPLANT
NEEDLE HYPO 22GX1.5 SAFETY (NEEDLE) ×2 IMPLANT
PACK GENERAL/GYN (CUSTOM PROCEDURE TRAY) ×2 IMPLANT
PENCIL SMOKE EVACUATOR (MISCELLANEOUS) IMPLANT
PUNCH BIOPSY 3 (MISCELLANEOUS) ×1 IMPLANT
SHEARS HARMONIC 9CM CVD (BLADE) IMPLANT
SPONGE SURGIFOAM ABS GEL 100 (HEMOSTASIS) IMPLANT
SURGILUBE 2OZ TUBE FLIPTOP (MISCELLANEOUS) ×2 IMPLANT
SUT CHROMIC 2 0 SH (SUTURE) IMPLANT
SUT CHROMIC 3 0 SH 27 (SUTURE) IMPLANT
SUT MNCRL AB 4-0 PS2 18 (SUTURE) ×1 IMPLANT
SUT VIC AB 3-0 SH 27 (SUTURE)
SUT VIC AB 3-0 SH 27X BRD (SUTURE) ×1 IMPLANT
SYR 20ML LL LF (SYRINGE) ×2 IMPLANT
TOWEL OR 17X26 10 PK STRL BLUE (TOWEL DISPOSABLE) ×2 IMPLANT
TOWEL OR NON WOVEN STRL DISP B (DISPOSABLE) ×2 IMPLANT

## 2020-03-29 NOTE — Op Note (Addendum)
03/29/2020  3:44 PM  PATIENT:  [name] [name]  60 y.o. male  Patient Care Team: [name], MD as PCP - General (Infectious Diseases) [name] Doroteo Bradford, MD as PCP - Infectious Diseases (Infectious Diseases) Fanny Skates, MD as Consulting Physician (General Surgery) Paralee Cancel, MD as Consulting Physician (Orthopedic Surgery) Lavonna Monarch, MD as Consulting Physician (Dermatology)  PRE-OPERATIVE DIAGNOSIS:  History of anal cancer; perianal mass/fibrosis  POST-OPERATIVE DIAGNOSIS:  Same  PROCEDURE:   1. Excision of perianal tissue, totaling 2 x 2 cm of tissue 2. Punch biopsy of gluteal fibrosis/nodularity 3. Anorectal exam under anesthesia  SURGEON:  Surgeon(s): Ileana Roup, MD   ANESTHESIA:   local and MAC  SPECIMEN:   1. Perianal nodular tissue 2. Gluteal punch biopsy (x3)  DISPOSITION OF SPECIMEN:  PATHOLOGY  COUNTS:  Sponge, needle, and instrument counts were reported correct x2 at conclusion.  EBL: 2 mL  PLAN OF CARE: Discharge to home after PACU  PATIENT DISPOSITION:  PACU - hemodynamically stable.  OR FINDINGS: Fixed fibrotic soft tissues in both ischio rectal fossa with nodular thickening and granularity circumferentially at the anal verge.  Excisional biopsies of these nodular-like thickenings were taken.  3 separate punch biopsies were then performed of an ulcerated area on the right ischiorectal fossa including a margin of more normal-appearing skin.  Anus was difficult to identify but ultimately we were able to do a digital rectal exam.  There was no grossly palpable mass within the anal canal.  Rectal exam was limited by the degree of fibrosis/soft tissue thickening of his buttocks  DESCRIPTION: The patient was identified in the preoperative holding area and taken to the OR where he was placed on the operating room table. SCDs were placed.  Anesthesia was induced without difficulty. The patient was then positioned in high lithotomy with  Allen stirrups. Pressure points were then evaluated and padded.  He was then prepped and draped in usual sterile fashion.  A surgical timeout was performed indicating the correct patient, procedure, and positioning.   After ascertaining that an appropriate level of anesthesia had been achieved, a well lubricated digital rectal exam was performed.  The anus was hard to identify.  There was dense fibrosis of both buttocks.  There is nodular thickening of the perianal skin.  A digital rectal exam demonstrates a fixed fibrotic anal canal and distal rectum.  Exam quite limited due to the findings previously noted.  There were 2 heaped up areas of thickened tissue in the perianal area that were excised for pathologic evaluation totaling 2 x 2 cm of tissue.  Local anesthetic was infiltrated prior to doing this.  There was ulceration on the right buttock with heaped up skin.  3 separate punch biopsies were then taken including a rim of "normal" skin with this.  These were passed off as a second specimen.  Hemostasis was then achieved electrocautery. Sponge, needle and instrument counts were reported correct x2. A dressing consisting of 4x4s/ABD/mesh underwear was placed. He was taken out of lithotomy, awakened from anesthesia, and transferred to a stretcher for transport to PACU in satisfactory condition.  DISPOSITION: PACU in satisfactory condition.

## 2020-03-29 NOTE — Anesthesia Preprocedure Evaluation (Addendum)
Anesthesia Evaluation  Patient identified by MRN, date of birth, ID band Patient awake    Reviewed: Allergy & Precautions, NPO status , Patient's Chart, lab work & pertinent test results  History of Anesthesia Complications (+) PONV and history of anesthetic complications  Airway Mallampati: II  TM Distance: >3 FB Neck ROM: Full    Dental  (+) Poor Dentition,    Pulmonary neg shortness of breath, neg sleep apnea, neg recent URI, Current Smoker and Patient abstained from smoking., former smoker,    Pulmonary exam normal breath sounds clear to auscultation       Cardiovascular (-) hypertensionnegative cardio ROS Normal cardiovascular exam Rhythm:Regular Rate:Normal     Neuro/Psych negative neurological ROS  negative psych ROS   GI/Hepatic negative GI ROS, Neg liver ROS,   Endo/Other  negative endocrine ROS  Renal/GU   negative genitourinary   Musculoskeletal  (+) Arthritis ,   Abdominal Normal abdominal exam  (+)   Peds  Hematology  (+) HIV,   Anesthesia Other Findings   Reproductive/Obstetrics                             Anesthesia Physical  Anesthesia Plan  ASA: III  Anesthesia Plan: MAC   Post-op Pain Management:    Induction:   PONV Risk Score and Plan: Ondansetron, Dexamethasone, Midazolam and Propofol infusion  Airway Management Planned: Natural Airway and Simple Face Mask  Additional Equipment: None  Intra-op Plan:   Post-operative Plan:   Informed Consent: I have reviewed the patients History and Physical, chart, labs and discussed the procedure including the risks, benefits and alternatives for the proposed anesthesia with the patient or authorized representative who has indicated his/her understanding and acceptance.     Dental advisory given  Plan Discussed with: CRNA and Surgeon  Anesthesia Plan Comments:         Anesthesia Quick Evaluation

## 2020-03-29 NOTE — Interval H&P Note (Signed)
History and Physical Interval Note:  03/29/2020 1:58 PM  [name] [name]  has presented today for surgery, with the diagnosis of ANAL MASS- CANCER SUSPECTED.  The various methods of treatment have been discussed with the patient. After consideration of risks, benefits and other options for treatment, the patient has consented to  Procedure(s): EXAM UNDER ANESTHESIA (N/A) EXCISION OF PERIANAL TISSUE AND BIOPSY (N/A) as a surgical intervention.  The patient's history has been reviewed, patient examined, no change in status, stable for surgery.  I have reviewed the patient's chart and labs.  Questions were answered to the patient's satisfaction, he expressed understanding and has opted to proceed.

## 2020-03-29 NOTE — Transfer of Care (Signed)
Immediate Anesthesia Transfer of Care Note  Patient: [name] [name]  Procedure(s) Performed: EXAM UNDER ANESTHESIA (N/A ) EXCISION OF PERIANAL TISSUE WITH BX AND 3 PUNCH BXS (N/A )  Patient Location: PACU  Anesthesia Type:MAC  Level of Consciousness: drowsy and patient cooperative  Airway & Oxygen Therapy: Patient Spontanous Breathing and Patient connected to face mask oxygen  Post-op Assessment: Report given to RN and Post -op Vital signs reviewed and stable  Post vital signs: Reviewed and stable  Last Vitals:  Vitals Value Taken Time  BP 112/96 03/29/20 1548  Temp    Pulse 56 03/29/20 1550  Resp 17 03/29/20 1550  SpO2 98 % 03/29/20 1550  Vitals shown include unvalidated device data.  Last Pain:  Vitals:   03/29/20 1311  TempSrc:   PainSc: 4       Patients Stated Pain Goal: 3 (28/97/91 5041)  Complications: No complications documented.

## 2020-03-29 NOTE — Discharge Instructions (Addendum)
Monitored Anesthesia Care, Care After This sheet gives you information about how to care for yourself after your procedure. Your health care provider may also give you more specific instructions. If you have problems or questions, contact your health care provider. What can I expect after the procedure? After the procedure, it is common to have:  Tiredness.  Forgetfulness about what happened after the procedure.  Impaired judgment for important decisions.  Nausea or vomiting.  Some difficulty with balance. Follow these instructions at home: For the time period you were told by your health care provider:  Rest as needed.  Do not participate in activities where you could fall or become injured.  Do not drive or use machinery.  Do not drink alcohol.  Do not take sleeping pills or medicines that cause drowsiness.  Do not make important decisions or sign legal documents.  Do not take care of children on your own.      Eating and drinking  Follow the diet that is recommended by your health care provider.  Drink enough fluid to keep your urine pale yellow.  If you vomit: ? Drink water, juice, or soup when you can drink without vomiting. ? Make sure you have little or no nausea before eating solid foods. General instructions  Have a responsible adult stay with you for the time you are told. It is important to have someone help care for you until you are awake and alert.  Take over-the-counter and prescription medicines only as told by your health care provider.  If you have sleep apnea, surgery and certain medicines can increase your risk for breathing problems. Follow instructions from your health care provider about wearing your sleep device: ? Anytime you are sleeping, including during daytime naps. ? While taking prescription pain medicines, sleeping medicines, or medicines that make you drowsy.  Avoid smoking.  Keep all follow-up visits as told by your health care  provider. This is important. Contact a health care provider if:  You keep feeling nauseous or you keep vomiting.  You feel light-headed.  You are still sleepy or having trouble with balance after 24 hours.  You develop a rash.  You have a fever.  You have redness or swelling around the IV site. Get help right away if:  You have trouble breathing.  You have new-onset confusion at home. Summary  For several hours after your procedure, you may feel tired. You may also be forgetful and have poor judgment.  Have a responsible adult stay with you for the time you are told. It is important to have someone help care for you until you are awake and alert.  Rest as told. Do not drive or operate machinery. Do not drink alcohol or take sleeping pills.  Get help right away if you have trouble breathing, or if you suddenly become confused. This information is not intended to replace advice given to you by your health care provider. Make sure you discuss any questions you have with your health care provider. Document Revised: 09/11/2019 Document Reviewed: 11/28/2018 Elsevier Patient Education  2021 [place]: POST OP INSTRUCTIONS  1. DIET: Follow a light bland diet the first 24 hours after arrival home, such as soup, liquids, crackers, etc.  Be sure to include lots of fluids daily.  Avoid fast food or heavy meals as your are more likely to get nauseated.  Eat a low fat diet the next few days after surgery.   2. Some bleeding with bowel movements  is expected for the first couple of days but this should stop in between bowel movements  3. Take your usually prescribed home medications unless otherwise directed.  4. PAIN CONTROL: a. It is helpful to take an over-the-counter pain medication regularly for the first few days/weeks.  Choose from the following that works best for you: i. Ibuprofen (Advil, etc) Three 200mg  tabs every 6 hours as needed. ii. Acetaminophen  (Tylenol, etc) 500-650mg  every 6 hours as needed iii. NOTE: You may take both of these medications together - most patients find it most helpful when alternating between the two (i.e. Ibuprofen at 6am, tylenol at 9am, ibuprofen at 12pm ...) b. A  prescription for pain medication may have been prescribed for you at discharge.  Take your pain medication as prescribed.  i. If you are having problems/concerns with the prescription medicine, please call us for further advice.  5. Avoid getting constipated.  Between the surgery and the pain medications, it is common to experience some constipation.  Increasing fluid intake (64oz of water per day) and taking a fiber supplement (such as Metamucil, Citrucel, FiberCon) 1-2 times a day regularly will usually help prevent this problem from occurring.  Take Miralax (over the counter) 1-2x/day while taking a narcotic pain medication. If no bowel movement after 48hours, you may additionally take a laxative like a bottle of Milk of Magnesia which can be purchased over the counter. Avoid enemas if possible as these are often painful.   6. Watch out for diarrhea.  If you have many loose bowel movements, simplify your diet to bland foods.  Stop any stool softeners and decrease your fiber supplement. If this worsens or does not improve, please call us.  7. Wash / shower every day.  If you were discharged with a dressing, you may remove this the day after your surgery. You may shower normally, getting soap/water on your wound, particularly after bowel movements.  8. Soaking in a warm bath filled a couple inches ("Sitz bath") is a great way to clean the area after a bowel movement and many patients find it is a way to soothe the area.  9. ACTIVITIES as tolerated:   a. You may resume regular (light) daily activities beginning the next day--such as daily self-care, walking, climbing stairs--gradually increasing activities as tolerated.  If you can walk 30 minutes without  difficulty, it is safe to try more intense activity such as jogging, treadmill, bicycling, low-impact aerobics, etc. b. Refrain from any heavy lifting or straining for the first 2 weeks after your procedure, particularly if your surgery was for hemorrhoids. c. Avoid activities that make your pain worse d. You may drive when you are no longer taking prescription pain medication, you can comfortably wear a seatbelt, and you can safely maneuver your car and apply brakes.  10. FOLLOW UP in our office a. Please call [facility] at [phone] to set up an appointment to see your surgeon in the office for a follow-up appointment approximately 2 weeks after your surgery. b. Make sure that you call for this appointment the day you arrive home to insure a convenient appointment time.  9. If you have disability or family leave forms that need to be completed, you may have them completed by your primary care physician's office; for return to work instructions, please ask our office staff and they will be happy to assist you in obtaining this documentation   When to call us [phone]: 1. Poor pain control 2. Reactions /  problems with new medications (rash/itching, etc)  3. Fever over 101.5 F (38.5 C) 4. Inability to urinate 5. Nausea/vomiting 6. Worsening swelling or bruising 7. Continued bleeding from incision. 8. Increased pain, redness, or drainage from the incision  The clinic staff is available to answer your questions during regular business hours (8:30am-5pm).  Please don't hesitate to call and ask to speak to one of our nurses for clinical concerns.   A surgeon from [facility] is always on call at the hospitals   If you have a medical emergency, go to the nearest emergency room or call 911.   [facility], [address], [place], [place], [place]  [address] ? MAIN: [phone] FAX [phone] [email or web address]

## 2020-03-30 ENCOUNTER — Encounter (HOSPITAL_COMMUNITY): Payer: Self-pay | Admitting: Surgery

## 2020-03-30 LAB — SURGICAL PATHOLOGY

## 2020-03-31 ENCOUNTER — Inpatient Hospital Stay: Payer: Medicare Other | Attending: Nurse Practitioner | Admitting: Nurse Practitioner

## 2020-03-31 ENCOUNTER — Other Ambulatory Visit: Payer: Self-pay

## 2020-03-31 VITALS — BP 147/90 | HR 77 | Temp 98.3°F | Resp 18 | Ht 74.0 in | Wt 141.8 lb

## 2020-03-31 DIAGNOSIS — Z86718 Personal history of other venous thrombosis and embolism: Secondary | ICD-10-CM | POA: Insufficient documentation

## 2020-03-31 DIAGNOSIS — Z923 Personal history of irradiation: Secondary | ICD-10-CM | POA: Diagnosis not present

## 2020-03-31 DIAGNOSIS — Z21 Asymptomatic human immunodeficiency virus [HIV] infection status: Secondary | ICD-10-CM | POA: Insufficient documentation

## 2020-03-31 DIAGNOSIS — Z9221 Personal history of antineoplastic chemotherapy: Secondary | ICD-10-CM | POA: Diagnosis not present

## 2020-03-31 DIAGNOSIS — R059 Cough, unspecified: Secondary | ICD-10-CM | POA: Diagnosis not present

## 2020-03-31 DIAGNOSIS — Z85048 Personal history of other malignant neoplasm of rectum, rectosigmoid junction, and anus: Secondary | ICD-10-CM | POA: Diagnosis present

## 2020-03-31 NOTE — Progress Notes (Addendum)
[facility] OFFICE PROGRESS NOTE   Diagnosis: History of anal cancer  INTERVAL HISTORY:   [name] [name] is a 60 year old man with a history of anal cancer dating to April 2008.  He completed concurrent chemotherapy and radiation.  He was last seen at the [facility] May 2018.  He was referred to Dr. Dema Severin earlier this month by gastroenterology for evaluation of a possible anal mass.  He underwent an exam under anesthesia on 03/29/2020.  Findings included fixed fibrotic soft tissues in both ischio rectal fossa with nodular thickening and granularity circumferentially at the anal verge.  Excisional biopsies of the nodular-like thickenings were taken.  3 separate punch biopsies were then performed of an ulcerated area on the right ischio rectal fossa including a margin of more normal-appearing skin.  On digital rectal exam no grossly palpable mass was noted within the anal canal.  Rectal exam was limited by the degree of fibrosis/soft tissue thickening of his buttocks.  Pathology on the perianal tissue showed hyperplastic squamous mucosa with hyperkeratosis, no dysplasia or malignancy.  Gluteal punch biopsy showed ulcer with adjacent reactive changes, no dysplasia or malignancy.  For the past 3 to 4 months he has noted intermittent nodules at the rectum, more persistent recently.  He chronically has pain with bowel movements but notes this has been worse recently as well.  He notes blood on the toilet tissue with wiping, chronic.  He denies weight loss.  He has a good appetite.  He describes his energy level is "low".  He denies fever.  He has a cough which she attributes to pollen.  No shortness of breath.  Objective:  Vital signs in last 24 hours:  Blood pressure (!) 147/90, pulse 77, temperature 98.3 F (36.8 C), temperature source Tympanic, resp. rate 18, height 6\' 2"  (1.88 m), weight 141 lb 12.8 oz (64.3 kg), SpO2 100 %.    HEENT: Neck without mass. Lymphatics: No palpable  cervical, supraclavicular, axillary or inguinal lymph nodes. Resp: Lungs clear bilaterally. Cardio: Regular rate and rhythm. GI: Abdomen soft and nontender.  No hepatomegaly.  Firm fibrotic tissue in the groin bilaterally. Vascular: No leg edema. Neuro: Alert and oriented. Skin: Firm fibrotic induration at the perineum and gluteal folds.  Area of nodularity at the left upper medial aspect gluteal fold.  Area of ulceration right medial upper gluteal region.  Verruca-like lesion left groin fold.  Small ulceration right side of penis.   Lab Results:  Lab Results  Component Value Date   WBC 4.9 03/29/2020   HGB 13.1 03/29/2020   HCT 38.3 (L) 03/29/2020   MCV 111.7 (H) 03/29/2020   PLT 111 (L) 03/29/2020   NEUTROABS 7.6 06/22/2014    Medications: I have reviewed the patient's current medications.  Assessment/Plan: 1. Anal cancer diagnosed in April 2008 status post concurrent chemotherapy and radiation. He is status post a negative perianal skin biopsy, November 27, 2006. 2. Status post bilateral inguinal hernia repair, January 08, 2007. 3. Hematoma following the inguinal hernia repair status post evacuation of the hematoma, January 15, 2007. The right lower quadrant wound healed by secondary intention, and there is a remaining wound defect in the right lower abdomen. 4. History of left lower extremity deep venous thrombosis. He is maintained off of Coumadin. 5. History of thrombocytopenia secondary to chemotherapy, radiation, polypharmacy and human immunodeficiency virus infection. 6. History of anemia secondary to surgery, the right groin hematoma and human immunodeficiency virus infection. 7. Human immunodeficiency virus infection followed by  [name]. 8. History of oral candidiasis. He no longer takes Diflucan. 9. History of anorexia/weight loss. Stable today.  10. History of hematuria, status post a cystoscopy revealing urethral stricture, status post a dilatation procedure by  Dr. Karsten Ro. 11. Extensive perineal/groin radiation fibrosis and scarring. No clinical evidence for local recurrence of cancer. 12. Hospitalized in July 2015 with a right iliopsoas abscess and septic right hip. He underwent resection of the right hip and debridement of a decubitus ulcer. 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 12. Hospitalized 03/15/2015 with right hip infection status post irrigation and debridement; completed IV antibiotics 04/28/2015. 14. Urethral stricture-followed at [facility] 15. Exam under anesthesia 03/29/2020-fixed fibrotic soft tissues in both ischio rectal fossa with nodular thickening and granularity circumferentially at the anal verge.  Excisional biopsies of the nodular-like thickenings were taken.  3 separate punch biopsies were then performed of an ulcerated area on the right ischio rectal fossa including a margin of more normal-appearing skin.  On digital rectal exam no grossly palpable mass was noted within the anal canal.  Rectal exam was limited by the degree of fibrosis/soft tissue thickening of his buttocks.  Pathology on the perianal tissue showed hyperplastic squamous mucosa with hyperkeratosis, no dysplasia or malignancy.  Gluteal punch biopsy showed ulcer with adjacent reactive changes, no dysplasia or malignancy.   Disposition: [name] [name] has a history of anal cancer dating to 2008.  He completed concurrent chemotherapy and radiation.  He was referred back to the [place] [employer] recently due to concern for recurrent anal cancer.  He underwent an exam under anesthesia 03/29/2020 with findings as above.  Biopsies were negative for malignancy.  Dr. Benay Spice reviewed the biopsy results with Mr. Gatling and his sister at today's appointment.  There is no obvious evidence of cancer on physical examination. Changes may represent radiation toxicity. Recommend continued clinical follow-up with Dr. Dema Severin. We did not schedule follow-up in our office but are  available to see him in the future if needed.  Patient seen with Dr. Benay Spice.  Ned Card ANP/GNP-BC   03/31/2020  10:48 AM [name] [name] was interviewed and examined.  He is known to [place] with a remote history of anal cancer.  He has chronic radiation fibrosis at the groin, perineum, and gluteal areas.  He underwent and examination under anesthesia with biopsies of nodular areas at the perineum.  The pathology is negative.  I do not recommend staging CTs unless he is confirmed to have recurrent or new anal cancer.  He will continue clinical follow-up with Dr. Dema Severin.  We are available to see him in the future as needed.  I was present for greater than 50% of today's visit.  I performed medical decision making.  Julieanne Manson, MD

## 2020-04-01 NOTE — Anesthesia Postprocedure Evaluation (Signed)
Anesthesia Post Note  Patient: [name] [name]  Procedure(s) Performed: EXAM UNDER ANESTHESIA (N/A ) EXCISION OF PERIANAL TISSUE WITH BX AND 3 PUNCH BXS (N/A )     Patient location during evaluation: PACU Anesthesia Type: MAC Level of consciousness: awake Pain management: pain level controlled Vital Signs Assessment: post-procedure vital signs reviewed and stable Respiratory status: spontaneous breathing Cardiovascular status: stable Postop Assessment: no apparent nausea or vomiting Anesthetic complications: no   No complications documented.  Last Vitals:  Vitals:   03/29/20 1645 03/29/20 1700  BP: (!) 145/85 (!) 144/90  Pulse: (!) 46 (!) 55  Resp: 17 19  Temp:  (!) 36.1 C  SpO2: 100% 100%    Last Pain:  Vitals:   03/29/20 1700  TempSrc:   PainSc: Ashley Jr

## 2020-04-05 ENCOUNTER — Telehealth: Payer: Self-pay | Admitting: *Deleted

## 2020-04-05 NOTE — Telephone Encounter (Signed)
-----   Message from Ladell Pier, MD sent at 04/02/2020  9:05 AM EDT ----- Regarding: FW: Please contact him and recommend he schedule an appointment with [facility] for a colonoscopy ----- Message ----- From: Ileana Roup, MD Sent: 04/01/2020   9:36 PM EDT To: Ladell Pier, MD Subject: RE:                                            I was able to do a digital exam under anesthesia. I imagine a colonoscopy with propofol might work. Another option would be a Cologuard and if positive, considering a colonoscopy. I don't generally jump straight to cologuard but he may be a reasonable candidate for the reasons outlined.  Gerald Stabs ----- Message ----- From: Ladell Pier, MD Sent: 04/01/2020   8:30 PM EDT To: Ileana Roup, MD  He want to go ahead with a screening colonoscopy if this can be done  Should he go back to GI for this?  I imagine it will be a difficult procedure  Thanks

## 2020-04-05 NOTE — Telephone Encounter (Signed)
Called patient regarding need for colonoscopy. Dr. Benay Spice and Dr. Dema Severin discussed case and suggest return to [facility] to schedule procedure. Staff message sent to Alonza Bogus, PA who last saw patient in office.

## 2020-04-06 ENCOUNTER — Telehealth: Payer: Self-pay

## 2020-04-06 NOTE — Telephone Encounter (Signed)
Sent to the schedulers to schedule colon and previsit

## 2020-04-06 NOTE — Telephone Encounter (Signed)
-----   Message from Loralie Champagne, PA-C sent at 04/06/2020  1:12 PM EDT ----- Regarding: FW: [facility] back for Colonoscopy Schedule colonoscopy per Dr. Vivia Ewing note below.  ----- Message ----- From: Lavena Bullion, DO Sent: 04/06/2020   9:36 AM EDT To: Laban Emperor Zehr, PA-C Subject: RE: [facility] back for Colonoscopy             Probably ok to schedule in [place]. My only debate would be that in the OR note, Dr. Dema Severin said it was difficult to find and exam rectal opening, but not necessarily that there was stenosis or stricturing. I'll plan for [facility] with peds colonoscopy, and change to upper scope if needed. Thanks.  ----- Message ----- From: Loralie Champagne, PA-C Sent: 04/06/2020   9:09 AM EDT To: Timothy Lasso, RN, Vito Lucille Passy, DO Subject: FW: [facility] back for Colonoscopy             Dr. Bryan Lemma, this is the guy that you examined with me in clinic a few weeks back.  He had exam under anesthesia with some excisional biopsies and punch biopsies.  He is being referred back to [place] now for colonoscopy.  Are you okay with scheduling?  Will it need to be done at the hospital?  Thank you,  Jess   ----- Message ----- From: Tania Ade, RN Sent: 04/05/2020   9:15 AM EDT To: Loralie Champagne, PA-C Subject: [facility] back for Colonoscopy                 Marcina Millard, Dr. Benay Spice and Dr. Dema Severin have discussed his case and need for colonoscopy. Will you discuss w/your physician to get him in for this procedure? He has been in [facility] before.  Thank you, Manuela Schwartz, RN

## 2020-04-07 ENCOUNTER — Encounter: Payer: Self-pay | Admitting: Gastroenterology

## 2020-04-07 NOTE — Telephone Encounter (Signed)
Called and spoke to [name] [name]. Colonoscopy scheduled for Tue 06-01-20  And Previsit 05-24-20 at 2:30pm.

## 2020-05-11 ENCOUNTER — Other Ambulatory Visit: Payer: Medicare Other

## 2020-05-12 ENCOUNTER — Emergency Department (HOSPITAL_COMMUNITY): Payer: Medicare Other

## 2020-05-12 ENCOUNTER — Other Ambulatory Visit: Payer: Self-pay

## 2020-05-12 ENCOUNTER — Encounter (HOSPITAL_COMMUNITY): Payer: Self-pay

## 2020-05-12 ENCOUNTER — Inpatient Hospital Stay (HOSPITAL_COMMUNITY)
Admission: EM | Admit: 2020-05-12 | Discharge: 2020-05-28 | DRG: 871 | Disposition: A | Discharge status: Skilled Nursing Facility | Payer: Medicare Other | Attending: Internal Medicine | Admitting: Internal Medicine

## 2020-05-12 DIAGNOSIS — Z681 Body mass index (BMI) 19 or less, adult: Secondary | ICD-10-CM

## 2020-05-12 DIAGNOSIS — L0390 Cellulitis, unspecified: Secondary | ICD-10-CM | POA: Diagnosis present

## 2020-05-12 DIAGNOSIS — Y92002 Bathroom of unspecified non-institutional (private) residence single-family (private) house as the place of occurrence of the external cause: Secondary | ICD-10-CM

## 2020-05-12 DIAGNOSIS — A401 Sepsis due to streptococcus, group B: Secondary | ICD-10-CM | POA: Diagnosis not present

## 2020-05-12 DIAGNOSIS — L8990 Pressure ulcer of unspecified site, unspecified stage: Secondary | ICD-10-CM | POA: Insufficient documentation

## 2020-05-12 DIAGNOSIS — Z9889 Other specified postprocedural states: Secondary | ICD-10-CM

## 2020-05-12 DIAGNOSIS — D72810 Lymphocytopenia: Secondary | ICD-10-CM | POA: Diagnosis present

## 2020-05-12 DIAGNOSIS — R7881 Bacteremia: Secondary | ICD-10-CM

## 2020-05-12 DIAGNOSIS — R296 Repeated falls: Secondary | ICD-10-CM | POA: Diagnosis present

## 2020-05-12 DIAGNOSIS — R739 Hyperglycemia, unspecified: Secondary | ICD-10-CM | POA: Diagnosis present

## 2020-05-12 DIAGNOSIS — Z9114 Patient's other noncompliance with medication regimen: Secondary | ICD-10-CM

## 2020-05-12 DIAGNOSIS — B20 Human immunodeficiency virus [HIV] disease: Secondary | ICD-10-CM | POA: Diagnosis present

## 2020-05-12 DIAGNOSIS — E2740 Unspecified adrenocortical insufficiency: Secondary | ICD-10-CM | POA: Diagnosis present

## 2020-05-12 DIAGNOSIS — R338 Other retention of urine: Secondary | ICD-10-CM | POA: Diagnosis present

## 2020-05-12 DIAGNOSIS — G9341 Metabolic encephalopathy: Secondary | ICD-10-CM | POA: Diagnosis present

## 2020-05-12 DIAGNOSIS — E872 Acidosis, unspecified: Secondary | ICD-10-CM

## 2020-05-12 DIAGNOSIS — S065X9A Traumatic subdural hemorrhage with loss of consciousness of unspecified duration, initial encounter: Secondary | ICD-10-CM | POA: Diagnosis present

## 2020-05-12 DIAGNOSIS — S065XAA Traumatic subdural hemorrhage with loss of consciousness status unknown, initial encounter: Secondary | ICD-10-CM | POA: Diagnosis present

## 2020-05-12 DIAGNOSIS — R680 Hypothermia, not associated with low environmental temperature: Secondary | ICD-10-CM | POA: Diagnosis present

## 2020-05-12 DIAGNOSIS — G062 Extradural and subdural abscess, unspecified: Secondary | ICD-10-CM

## 2020-05-12 DIAGNOSIS — Z85048 Personal history of other malignant neoplasm of rectum, rectosigmoid junction, and anus: Secondary | ICD-10-CM

## 2020-05-12 DIAGNOSIS — D6959 Other secondary thrombocytopenia: Secondary | ICD-10-CM | POA: Diagnosis present

## 2020-05-12 DIAGNOSIS — M109 Gout, unspecified: Secondary | ICD-10-CM | POA: Diagnosis present

## 2020-05-12 DIAGNOSIS — N179 Acute kidney failure, unspecified: Secondary | ICD-10-CM | POA: Diagnosis present

## 2020-05-12 DIAGNOSIS — L89021 Pressure ulcer of left elbow, stage 1: Secondary | ICD-10-CM | POA: Diagnosis present

## 2020-05-12 DIAGNOSIS — G8929 Other chronic pain: Secondary | ICD-10-CM

## 2020-05-12 DIAGNOSIS — Z86718 Personal history of other venous thrombosis and embolism: Secondary | ICD-10-CM

## 2020-05-12 DIAGNOSIS — M009 Pyogenic arthritis, unspecified: Secondary | ICD-10-CM | POA: Diagnosis present

## 2020-05-12 DIAGNOSIS — Z79899 Other long term (current) drug therapy: Secondary | ICD-10-CM

## 2020-05-12 DIAGNOSIS — Z9119 Patient's noncompliance with other medical treatment and regimen: Secondary | ICD-10-CM

## 2020-05-12 DIAGNOSIS — Z96641 Presence of right artificial hip joint: Secondary | ICD-10-CM | POA: Diagnosis present

## 2020-05-12 DIAGNOSIS — W06XXXA Fall from bed, initial encounter: Secondary | ICD-10-CM | POA: Diagnosis present

## 2020-05-12 DIAGNOSIS — R6521 Severe sepsis with septic shock: Secondary | ICD-10-CM | POA: Diagnosis present

## 2020-05-12 DIAGNOSIS — J9601 Acute respiratory failure with hypoxia: Secondary | ICD-10-CM

## 2020-05-12 DIAGNOSIS — R1310 Dysphagia, unspecified: Secondary | ICD-10-CM | POA: Diagnosis present

## 2020-05-12 DIAGNOSIS — Z4659 Encounter for fitting and adjustment of other gastrointestinal appliance and device: Secondary | ICD-10-CM

## 2020-05-12 DIAGNOSIS — A419 Sepsis, unspecified organism: Secondary | ICD-10-CM

## 2020-05-12 DIAGNOSIS — B955 Unspecified streptococcus as the cause of diseases classified elsewhere: Secondary | ICD-10-CM

## 2020-05-12 DIAGNOSIS — F1020 Alcohol dependence, uncomplicated: Secondary | ICD-10-CM | POA: Diagnosis present

## 2020-05-12 DIAGNOSIS — Z0189 Encounter for other specified special examinations: Secondary | ICD-10-CM

## 2020-05-12 DIAGNOSIS — E860 Dehydration: Secondary | ICD-10-CM | POA: Diagnosis present

## 2020-05-12 DIAGNOSIS — F17210 Nicotine dependence, cigarettes, uncomplicated: Secondary | ICD-10-CM | POA: Diagnosis present

## 2020-05-12 DIAGNOSIS — Z809 Family history of malignant neoplasm, unspecified: Secondary | ICD-10-CM

## 2020-05-12 DIAGNOSIS — E870 Hyperosmolality and hypernatremia: Secondary | ICD-10-CM | POA: Diagnosis present

## 2020-05-12 DIAGNOSIS — T68XXXA Hypothermia, initial encounter: Secondary | ICD-10-CM | POA: Diagnosis present

## 2020-05-12 DIAGNOSIS — E440 Moderate protein-calorie malnutrition: Secondary | ICD-10-CM | POA: Diagnosis present

## 2020-05-12 DIAGNOSIS — Z20822 Contact with and (suspected) exposure to covid-19: Secondary | ICD-10-CM | POA: Diagnosis present

## 2020-05-12 DIAGNOSIS — D638 Anemia in other chronic diseases classified elsewhere: Secondary | ICD-10-CM | POA: Diagnosis present

## 2020-05-12 DIAGNOSIS — N433 Hydrocele, unspecified: Secondary | ICD-10-CM | POA: Diagnosis present

## 2020-05-12 DIAGNOSIS — R7989 Other specified abnormal findings of blood chemistry: Secondary | ICD-10-CM | POA: Diagnosis present

## 2020-05-12 DIAGNOSIS — L89150 Pressure ulcer of sacral region, unstageable: Secondary | ICD-10-CM | POA: Diagnosis present

## 2020-05-12 DIAGNOSIS — M25551 Pain in right hip: Secondary | ICD-10-CM

## 2020-05-12 DIAGNOSIS — Z923 Personal history of irradiation: Secondary | ICD-10-CM

## 2020-05-12 DIAGNOSIS — E876 Hypokalemia: Secondary | ICD-10-CM | POA: Diagnosis present

## 2020-05-12 DIAGNOSIS — L89891 Pressure ulcer of other site, stage 1: Secondary | ICD-10-CM | POA: Diagnosis present

## 2020-05-12 DIAGNOSIS — Z888 Allergy status to other drugs, medicaments and biological substances status: Secondary | ICD-10-CM

## 2020-05-12 DIAGNOSIS — E8770 Fluid overload, unspecified: Secondary | ICD-10-CM | POA: Diagnosis present

## 2020-05-12 DIAGNOSIS — T380X5A Adverse effect of glucocorticoids and synthetic analogues, initial encounter: Secondary | ICD-10-CM | POA: Diagnosis present

## 2020-05-12 DIAGNOSIS — L89012 Pressure ulcer of right elbow, stage 2: Secondary | ICD-10-CM | POA: Diagnosis present

## 2020-05-12 DIAGNOSIS — I76 Septic arterial embolism: Secondary | ICD-10-CM | POA: Diagnosis present

## 2020-05-12 LAB — URINALYSIS, ROUTINE W REFLEX MICROSCOPIC
Bilirubin Urine: NEGATIVE
Glucose, UA: NEGATIVE mg/dL
Ketones, ur: 20 mg/dL — AB
Leukocytes,Ua: NEGATIVE
Nitrite: NEGATIVE
Protein, ur: 30 mg/dL — AB
Specific Gravity, Urine: 1.015 (ref 1.005–1.030)
pH: 6 (ref 5.0–8.0)

## 2020-05-12 LAB — CBC WITH DIFFERENTIAL/PLATELET
Abs Immature Granulocytes: 0 10*3/uL (ref 0.00–0.07)
Basophils Absolute: 0 10*3/uL (ref 0.0–0.1)
Basophils Relative: 0 %
Eosinophils Absolute: 0.2 10*3/uL (ref 0.0–0.5)
Eosinophils Relative: 1 %
HCT: 45 % (ref 39.0–52.0)
Hemoglobin: 15 g/dL (ref 13.0–17.0)
Lymphocytes Relative: 6 %
Lymphs Abs: 0.9 10*3/uL (ref 0.7–4.0)
MCH: 37.4 pg — ABNORMAL HIGH (ref 26.0–34.0)
MCHC: 33.3 g/dL (ref 30.0–36.0)
MCV: 112.2 fL — ABNORMAL HIGH (ref 80.0–100.0)
Monocytes Absolute: 0.5 10*3/uL (ref 0.1–1.0)
Monocytes Relative: 3 %
Neutro Abs: 14 10*3/uL — ABNORMAL HIGH (ref 1.7–7.7)
Neutrophils Relative %: 90 %
Platelets: 77 10*3/uL — ABNORMAL LOW (ref 150–400)
RBC: 4.01 MIL/uL — ABNORMAL LOW (ref 4.22–5.81)
RDW: 16.5 % — ABNORMAL HIGH (ref 11.5–15.5)
WBC: 15.5 10*3/uL — ABNORMAL HIGH (ref 4.0–10.5)
nRBC: 0 % (ref 0.0–0.2)

## 2020-05-12 LAB — COMPREHENSIVE METABOLIC PANEL
ALT: 66 U/L — ABNORMAL HIGH (ref 0–44)
AST: 101 U/L — ABNORMAL HIGH (ref 15–41)
Albumin: 2.5 g/dL — ABNORMAL LOW (ref 3.5–5.0)
Alkaline Phosphatase: 76 U/L (ref 38–126)
Anion gap: 17 — ABNORMAL HIGH (ref 5–15)
BUN: 70 mg/dL — ABNORMAL HIGH (ref 6–20)
CO2: 15 mmol/L — ABNORMAL LOW (ref 22–32)
Calcium: 8.1 mg/dL — ABNORMAL LOW (ref 8.9–10.3)
Chloride: 117 mmol/L — ABNORMAL HIGH (ref 98–111)
Creatinine, Ser: 2.05 mg/dL — ABNORMAL HIGH (ref 0.61–1.24)
GFR, Estimated: 37 mL/min — ABNORMAL LOW (ref >60)
Glucose, Bld: 102 mg/dL — ABNORMAL HIGH (ref 70–99)
Potassium: 4.7 mmol/L (ref 3.5–5.1)
Sodium: 149 mmol/L — ABNORMAL HIGH (ref 135–145)
Total Bilirubin: 2.5 mg/dL — ABNORMAL HIGH (ref 0.3–1.2)
Total Protein: 6.9 g/dL (ref 6.5–8.1)

## 2020-05-12 LAB — BLOOD GAS, ARTERIAL
Acid-base deficit: 14.4 mmol/L — ABNORMAL HIGH (ref 0.0–2.0)
Bicarbonate: 10.4 mmol/L — ABNORMAL LOW (ref 20.0–28.0)
O2 Saturation: 99.5 %
Patient temperature: 97.2
pCO2 arterial: 21.5 mmHg — ABNORMAL LOW (ref 32.0–48.0)
pH, Arterial: 7.301 — ABNORMAL LOW (ref 7.350–7.450)
pO2, Arterial: 331 mmHg — ABNORMAL HIGH (ref 83.0–108.0)

## 2020-05-12 LAB — RESP PANEL BY RT-PCR (FLU A&B, COVID) ARPGX2
Influenza A by PCR: NEGATIVE
Influenza B by PCR: NEGATIVE
SARS Coronavirus 2 by RT PCR: NEGATIVE

## 2020-05-12 LAB — PROTIME-INR
INR: 1.2 (ref 0.8–1.2)
Prothrombin Time: 14.9 seconds (ref 11.4–15.2)

## 2020-05-12 LAB — LACTIC ACID, PLASMA
Lactic Acid, Venous: 3.4 mmol/L (ref 0.5–1.9)
Lactic Acid, Venous: 2.9 mmol/L (ref 0.5–1.9)

## 2020-05-12 LAB — APTT: aPTT: 28 seconds (ref 24–36)

## 2020-05-12 LAB — CK: Total CK: 449 U/L — ABNORMAL HIGH (ref 49–397)

## 2020-05-12 MED ORDER — SODIUM CHLORIDE 0.9 % IV SOLN
2.0000 g | Freq: Once | INTRAVENOUS | Status: AC
  Administered 2020-05-12: 2 g via INTRAVENOUS
  Filled 2020-05-12: qty 2

## 2020-05-12 MED ORDER — LACTATED RINGERS IV BOLUS
1000.0000 mL | Freq: Once | INTRAVENOUS | Status: AC
  Administered 2020-05-12: 1000 mL via INTRAVENOUS

## 2020-05-12 MED ORDER — VANCOMYCIN HCL 1500 MG/300ML IV SOLN
1500.0000 mg | Freq: Once | INTRAVENOUS | Status: AC
  Administered 2020-05-12: 1500 mg via INTRAVENOUS
  Filled 2020-05-12: qty 300

## 2020-05-12 MED ORDER — LACTATED RINGERS IV BOLUS (SEPSIS)
1000.0000 mL | Freq: Once | INTRAVENOUS | Status: AC
  Administered 2020-05-12: 1000 mL via INTRAVENOUS

## 2020-05-12 MED ORDER — METRONIDAZOLE 500 MG/100ML IV SOLN
500.0000 mg | Freq: Once | INTRAVENOUS | Status: AC
  Administered 2020-05-12: 500 mg via INTRAVENOUS
  Filled 2020-05-12: qty 100

## 2020-05-12 MED ORDER — LACTATED RINGERS IV SOLN: INTRAVENOUS | Status: DC

## 2020-05-12 MED ORDER — VANCOMYCIN HCL IN DEXTROSE 1-5 GM/200ML-% IV SOLN: 1000.0000 mg | Freq: Once | INTRAVENOUS | Status: DC

## 2020-05-12 MED ORDER — LACTATED RINGERS IV BOLUS
1000.0000 mL | Freq: Once | INTRAVENOUS | Status: AC
  Administered 2020-05-13: 1000 mL via INTRAVENOUS

## 2020-05-12 NOTE — ED Notes (Signed)
bair hugger put on pt at [employer]

## 2020-05-12 NOTE — ED Notes (Signed)
Pt unable to sign MSE d/t weakness, AMS

## 2020-05-12 NOTE — ED Triage Notes (Signed)
Pt brought from home- was found on bathroom floor, unknown amount of time on bathroom floor. Family saw pt 2 days ago, last time anyone saw pt. Pt has hx of sepsis. EMS reports pt was on [place], then began to decline and was in 70s on non-rebreather. EMS states pt was GCS 14 on arrival, and then mental status declined and went to GCS 12. Pt brief is extremely soiled, many sores on buttocks

## 2020-05-12 NOTE — Progress Notes (Signed)
A consult was received from an ED physician for vancomycin per pharmacy dosing.  The patient's profile has been reviewed for ht/wt/allergies/indication/available labs.   A one time order has been placed for cefepime 2 g x 1 per MD and vancomycin 1500 mg x 1.  Further antibiotics/pharmacy consults should be ordered by admitting physician if indicated.                       Thank you, Napoleon Form 05/12/2020  6:39 PM

## 2020-05-12 NOTE — ED Notes (Signed)
Family at bedside. 

## 2020-05-12 NOTE — Sepsis Progress Note (Signed)
Code sepsis protocol being monitored by [employer]. 

## 2020-05-12 NOTE — ED Provider Notes (Signed)
[facility] DEPT Provider Note   CSN: [id number] Arrival date & time: 05/12/20  1806     History Chief Complaint  Patient presents with  . Code Sepsis    [name] [name] is a 60 y.o. male past medical history of rectal cancer, HIV, presenting via EMS from home for being found down in the restroom.  Initial history is provided per EMS.  Initially was reported that patient was last seen about 2 days ago by his sister.  He was found down in the bathroom today covered in his feces.  Initially GCS of 14, however then dropped down to 12 and they bagged him briefly.  He was placed on nonrebreather.  Concern for sepsis.  Patient is able to tell me his name, his birthdate, that he is at the hospital.  He is unable to tell me any history as to what happened prior to ED arrival.  Patient sister arrived later and provided additional history.  She states it has been about 1 week since she last saw him.  She states some recent medical history of his: about 2 to 3 weeks ago he had some biopsies done by colorectal surgeon for inflammation in his groin with concern for recurrence of his rectal cancer.  The biopsies just showed inflammation and no recurrence.  However after that he had some weakness and had some bleeding.  He began feeling better, however last Wednesday he had a "GI bug" and at that time fell out of bed.  He was having some right hip pain though that began to improve.  She helped him about a week ago to the when his hip is been feeling better.  She thinks that is about the last time she saw him.  He was post to work this evening at 4 PM and after he did not show and did not answer his phone she was contacted and she found him on the floor in the bathroom.  She states his head was turned to the right and count of wedged between the toilet on the tub.  He had stooled on himself.  She states it did not look like he was there for an entire week, maybe a couple of days she is  unsure.  This is confirmed per review of medical record, recent documentation by oncology. Negative biopsies. Changes suggested to represent radiation toxicity.  The history is provided by the EMS personnel, a relative and medical records. The history is limited by the condition of the patient.       Past Medical History:  Diagnosis Date  . Arthritis   . Decubitus ulcer of sacral area   . DVT (deep venous thrombosis) ([place]) 2008   Left leg  . Exposure to hepatitis B   . H/O hypotension   . History of anemia   . History of gout   . History of transfusion   . 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 HIV (human immunodeficiency virus infection) ([place])   . Hx of sepsis 2015   affected rt hip / femur  . Pain in limb 07/23/2013  . PONV (postoperative nausea and vomiting)    Hiccups for 4 days after anesthesia  . Radiation   . 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 Shingles    left shoulder, right leg    Patient Active Problem List   Diagnosis Date Noted  . AKI (acute kidney injury) ([place]) 05/13/2020  .  Lactic acidosis 05/13/2020  . SDH (subdural hematoma) ([place]) 05/13/2020  . Hypothermia due to exposure 05/13/2020  . Rectal bleeding 02/20/2020  . Abnormal digital rectal exam 02/20/2020  . History of anal cancer 02/20/2020  . Poor dentition 01/16/2017  . Renal failure, chronic 06/10/2014  . Septic arthritis of hip ([place]) 07/31/2013  . Decubitus ulcer of sacral region, stage 2 ([place]) 07/31/2013  . Protein-calorie malnutrition, severe ([place]) 07/31/2013  . Pain in limb 07/23/2013  . Inguinal adenopathy 02/18/2013  . Enlargement of lymph nodes 01/13/2013  . 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 ANEMIA, CHRONIC 03/20/2007  . Dental caries 03/20/2007  . Unspecified severe protein-calorie malnutrition ([place]) 02/14/2007  . DVT 08/22/2006  . 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[place]) 05/14/2006  . ABSCESS, RECTUM 04/11/2006  . CANDIDIASIS, MOUTH ([place]) 02/16/2006  . DIARRHEA  02/16/2006  . Human immunodeficiency virus (HIV) disease ([place]) 10/19/2005  . MOLLUSCUM CONTAGIOSUM 10/19/2005  . GOUT 10/19/2005  . HEPATITIS B, HX OF 10/19/2005  . HERPES ZOSTER, HX OF 10/19/2005    Past Surgical History:  Procedure Laterality Date  . COLONOSCOPY    . CONVERSION TO TOTAL HIP Right 06/15/2014   Procedure: REMOVAL OF ANTIBIOTIC  SPACER AND CONVERSION TO RIGHT TOTAL HIP  ARTHROPLASTY ;  Surgeon: Paralee Cancel, MD;  Location: [facility];  Service: Orthopedics;  Laterality: Right;  . CYSTOSCOPY    . HEMATOMA EVACUATION    . HERNIA REPAIR     right X2  . INCISION AND DRAINAGE HIP Right 07/31/2013   Procedure: IRRIGATION AND DEBRIDEMENT HIP WITH PERCUTANEOUS DRAIN PLACEMENT;  Surgeon: [name], MD;  Location: [facility];  Service: Orthopedics;  Laterality: Right;  . INCISION AND DRAINAGE HIP Right 03/15/2015   Procedure: IRRIGATION AND DEBRIDEMENT RIGHT HIP WITH  HEAD BALL  AND POLY LINER EXCHANGE;  Surgeon: Paralee Cancel, MD;  Location: [facility];  Service: Orthopedics;  Laterality: Right;  . INCISION AND DRAINAGE OF WOUND N/A 08/25/2013   Procedure: IRRIGATION AND DEBRIDEMENT OF SACRAL ULCER WITH PLACEMENT OF A CELL ;  Surgeon: Theodoro Kos, DO;  Location: [facility];  Service: Plastics;  Laterality: N/A;  . INCISION AND DRAINAGE OF WOUND N/A 09/01/2013   Procedure: IRRIGATION AND DEBRIDEMENT SACRAL ULCER WITH PLACEMENT OF A CELL;  Surgeon: Theodoro Kos, DO;  Location: [facility];  Service: Plastics;  Laterality: N/A;  . INCISION AND DRAINAGE OF WOUND N/A 09/08/2013   Procedure: IRRIGATION AND DEBRIDEMENT OF SACRAL ULCER WITH PLACEMENT OF A CELL AND VAC;  Surgeon: Theodoro Kos, DO;  Location: [facility];  Service: Plastics;  Laterality: N/A;  . INCISION AND DRAINAGE OF WOUND N/A 09/18/2013   Procedure: IRRIGATION AND DEBRIDEMENT OF SACRAL WOUND WITH PLACEMENT OF A-CELL;  Surgeon: Theodoro Kos, DO;  Location: [facility];  Service: Plastics;  Laterality: N/A;  . PILONIDAL CYST DRAINAGE N/A 08/09/2013    Procedure: IRRIGATION AND DEBRIDEMENT SACRAL DECUBITUS;  Surgeon: Harl Bowie, MD;  Location: [facility];  Service: General;  Laterality: N/A;  . RECTAL BIOPSY N/A 03/29/2020   Procedure: EXCISION OF PERIANAL TISSUE WITH BX AND 3 PUNCH BXS;  Surgeon: Ileana Roup, MD;  [facility];  Service: General;  Laterality: N/A;  . TOTAL HIP ARTHROPLASTY Right 08/04/2013   Procedure: RESECTION HIP JOINT - PLACEMENT OF CEMENT PROSTHESIS;  Surgeon: [name], MD;  Location: [facility];  Service: Orthopedics;  Laterality: Right;  . TRANSURETHRAL RESECTION OF PROSTATE  JAN 2016       Family History  Problem Relation Age  of Onset  . Diabetes Mother   . Dementia Mother   . Atrial fibrillation Mother   . Hypertension Mother   . Cancer Father   . Emphysema Father     Social History   Tobacco Use  . Smoking status: Current Every Day Smoker    Packs/day: 0.30    Types: Cigarettes  . Smokeless tobacco: Never Used  Vaping Use  . Vaping Use: Never used  Substance Use Topics  . Alcohol use: Yes    Alcohol/week: 3.0 standard drinks    Types: 3 Standard drinks or equivalent per week  . Drug use: Yes    Types: Marijuana    Comment: occ.    Home Medications Prior to Admission medications   Medication Sig Start Date End Date Taking? Authorizing Provider  chlorproMAZINE (THORAZINE) 25 MG tablet Take 25 mg by mouth 3 (three) times daily.    [provider]  darunavir-cobicistat (PREZCOBIX) 800-150 MG tablet TAKE ONE TABLET BY MOUTH DAILY . SWALLOW WHOLE, DO NOT CRUSH, BREAK, OR CHEW TABLETS. TAKE WITH FOOD Patient taking differently: Take 1 tablet by mouth daily. SWALLOW WHOLE, DO NOT CRUSH, BREAK, OR CHEW TABLETS. TAKE WITH FOOD 01/01/20   [name], MD  levofloxacin (LEVAQUIN) 500 MG tablet TAKE 1 TABLET(500 MG) BY MOUTH DAILY Patient taking differently: Take 500 mg by mouth daily. 05/14/18   [name], MD  rilpivirine (EDURANT) 25 MG TABS tablet TAKE ONE TABLET  (25 MG TOTAL) BY MOUTH DAILY WITH BREAKFAST Patient taking differently: Take 25 mg by mouth daily with breakfast. 01/01/20   [name], MD  TIVICAY 50 MG tablet TAKE ONE TABLET (50 MG TOTAL) BY MOUTH DAILY Patient taking differently: Take 50 mg by mouth daily. 01/01/20   [name], MD  traMADol (ULTRAM) 50 MG tablet Take 50 mg by mouth every 6 (six) hours as needed for severe pain.    [provider]  vitamin C (ASCORBIC ACID) 500 MG tablet Take 1,000 mg by mouth daily.     [provider]    Allergies    Collagen  Review of Systems   Review of Systems  Unable to perform ROS: Mental status change    Physical Exam Updated Vital Signs BP 116/73   Pulse (!) 102   Temp 99.3 F (37.4 C)   Resp (!) 35   Ht 6\' 2"  (1.88 m)   Wt 64 kg   SpO2 100%   BMI 18.12 kg/m   Physical Exam Vitals and nursing note reviewed.  Constitutional:      Appearance: He is well-developed.     Comments: Chronically ill-appearing. cachectic  HENT:     Head: Normocephalic and atraumatic.     Mouth/Throat:     Comments: Poor dentition. Mucous membranes are dry Eyes:     Conjunctiva/sclera: Conjunctivae normal.  Cardiovascular:     Rate and Rhythm: Regular rhythm. Tachycardia present.  Pulmonary:     Effort: Pulmonary effort is normal.     Breath sounds: Normal breath sounds.     Comments: Patient on nonrebreather, satting in the 90s. Abdominal:     General: Bowel sounds are normal.     Palpations: Abdomen is soft.     Tenderness: There is no abdominal tenderness. There is no guarding or rebound.     Comments: Postsurgical changes to the right groin.   Genitourinary:    Comments: Exam performed with nurse tech and RN chaperone present.  Scrotum is edematous and  red.  Musculoskeletal:     Cervical back: Tenderness present.     Comments: Patient is diffusely tender to the body.   Skin:    General: Skin is warm.     Comments: Decubitus ulcer to the sacrum.   Neurological:     Mental Status: He is alert.     Comments: Patient is oriented to person, place, birthdate.  He is following simple commands without difficulty.  Right pupil is about 1 mm larger than the left, both are round and reactive.  No neglect is noted though patient is resting with his head turned towards the right. No obvious facial droop.  Speech may be slightly slurred. Strong and equal grip strength BUE     ED Results / Procedures / Treatments   Labs (all labs ordered are listed, but only abnormal results are displayed) Labs Reviewed  COMPREHENSIVE METABOLIC PANEL - Abnormal; Notable for the following components:      Result Value   Sodium 149 (*)    Chloride 117 (*)    CO2 15 (*)    Glucose, Bld 102 (*)    BUN 70 (*)    Creatinine, Ser 2.05 (*)    Calcium 8.1 (*)    Albumin 2.5 (*)    AST 101 (*)    ALT 66 (*)    Total Bilirubin 2.5 (*)    GFR, Estimated 37 (*)    Anion gap 17 (*)    All other components within normal limits  LACTIC ACID, PLASMA - Abnormal; Notable for the following components:   Lactic Acid, Venous 3.4 (*)    All other components within normal limits  LACTIC ACID, PLASMA - Abnormal; Notable for the following components:   Lactic Acid, Venous 2.9 (*)    All other components within normal limits  CBC WITH DIFFERENTIAL/PLATELET - Abnormal; Notable for the following components:   WBC 15.5 (*)    RBC 4.01 (*)    MCV 112.2 (*)    MCH 37.4 (*)    RDW 16.5 (*)    Platelets 77 (*)    Neutro Abs 14.0 (*)    All other components within normal limits  CK - Abnormal; Notable for the following components:   Total CK 449 (*)    All other components within normal limits  BLOOD GAS, ARTERIAL - Abnormal; Notable for the following components:   pH, Arterial 7.301 (*)    pCO2 arterial 21.5 (*)    pO2, Arterial 331 (*)    Bicarbonate 10.4 (*)    Acid-base deficit 14.4 (*)    All other components within normal limits  URINALYSIS, ROUTINE W REFLEX  MICROSCOPIC - Abnormal; Notable for the following components:   Hgb urine dipstick LARGE (*)    Ketones, ur 20 (*)    Protein, ur 30 (*)    Bacteria, UA RARE (*)    All other components within normal limits  RESP PANEL BY RT-PCR (FLU A&B, COVID) ARPGX2  CULTURE, BLOOD (ROUTINE X 2)  CULTURE, BLOOD (ROUTINE X 2)  URINE CULTURE  PROTIME-INR  APTT  AMMONIA  BRAIN NATRIURETIC PEPTIDE    EKG EKG Interpretation  Date/Time:  Wednesday May 12 2020 18:26:28 EDT Ventricular Rate:  153 PR Interval:    QRS Duration: 87 QT Interval:  297 QTC Calculation: 474 R Axis:   91 Text Interpretation: Atrial fibrillation Borderline right axis deviation ST depression, probably rate related Artifact in lead(s) I II III aVR aVL aVF V1 V2 V4  V5 V6 Confirmed by Lacretia Leigh ([id number]) on 05/12/2020 10:22:23 PM   Radiology CT Abdomen Pelvis Wo Contrast  Result Date: 05/12/2020 CLINICAL DATA:  Sepsis, Unwitnessed fall, abdominal abscess EXAM: CT ABDOMEN AND PELVIS WITHOUT CONTRAST TECHNIQUE: Multidetector CT imaging of the abdomen and pelvis was performed following the standard protocol without IV contrast. COMPARISON:  07/31/2013 FINDINGS: Lower chest: Mild bibasilar atelectasis. The visualized heart and pericardium are unremarkable. Hepatobiliary: No focal liver abnormality is seen. No gallstones, gallbladder wall thickening, or biliary dilatation. Pancreas: Unremarkable Spleen: Unremarkable Adrenals/Urinary Tract: The adrenal glands are unremarkable. The kidneys are normal. The bladder is partially obscured by streak artifact from right total hip arthroplasty. Foley catheter balloon is seen within the bladder lumen. The visualized bladder is unremarkable. Stomach/Bowel: Stomach is within normal limits. Appendix appears normal. No evidence of bowel wall thickening, distention, or inflammatory changes. No free intraperitoneal gas or fluid. Vascular/Lymphatic: Mild atherosclerotic calcification within the abdominal  aorta. No aortic aneurysm. No pathologic adenopathy within the abdomen and pelvis. Reproductive: The prostate gland is partially obscured by streak artifact but is otherwise unremarkable. Seminal vesicles are unremarkable. Other: There is a soft tissue wound in the region of the intergluteal cleft just subjacent to the coccyx. This appears slightly enlarged since prior examination. There is no associated discrete drainable fluid collection identified. 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 No acute bone abnormality. IMPRESSION: No acute intra-abdominal pathology identified. No definite radiographic explanation for the patient's reported symptoms. Enlarging superficial soft tissue wound within the intergluteal cleft immediately subjacent to the coccyx. No extension into the perirectal or presacral space. No associated drainable fluid collection. Aortic Atherosclerosis (ICD10-I70.0). Electronically Signed   By: Fidela Salisbury MD   On: 05/12/2020 22:33   CT Head Wo Contrast  Result Date: 05/12/2020 CLINICAL DATA:  Found down, sepsis EXAM: CT HEAD WITHOUT CONTRAST TECHNIQUE: Contiguous axial images were obtained from the base of the skull through the vertex without intravenous contrast. COMPARISON:  None. FINDINGS: Brain: There is a small subdural hematoma along the posterior falx, measuring up to 2 mm in thickness. No mass effect. No acute infarct. Lateral ventricles and remaining midline structures are unremarkable. Vascular: No hyperdense vessel or unexpected calcification. Skull: Normal. Negative for fracture or focal lesion. Sinuses/Orbits: No acute finding. Other: None. IMPRESSION: 1. 2 mm posterior falcine subdural hematoma.  No mass effect. 2. No acute infarct. Critical Value/emergent results were called by telephone at the time of interpretation on 05/12/2020 at 10:34 pm to provider DR [facility], who verbally acknowledged these results. Electronically Signed   By: Randa Ngo M.D.    On: 05/12/2020 22:34   CT Cervical Spine Wo Contrast  Result Date: 05/12/2020 CLINICAL DATA:  Found down EXAM: CT CERVICAL SPINE WITHOUT CONTRAST TECHNIQUE: Multidetector CT imaging of the cervical spine was performed without intravenous contrast. Multiplanar CT image reconstructions were also generated. COMPARISON:  None. FINDINGS: Alignment: Alignment is grossly anatomic. Skull base and vertebrae: No acute fracture. No primary bone lesion or focal pathologic process. Soft tissues and spinal canal: No prevertebral fluid or swelling. No visible canal hematoma. Disc levels: Prominent spondylosis at C5-6 and C6-7. Mild diffuse facet hypertrophy. Upper chest: Airway is patent. Emphysematous changes are noted at the lung apices. Other: Reconstructed images demonstrate no additional findings. IMPRESSION: 1. No acute cervical spine fracture. Multilevel cervical degenerative change. Electronically Signed   By: Randa Ngo M.D.   On: 05/12/2020 22:33   DG Chest Port 1 View  Result Date: 05/12/2020 CLINICAL  DATA:  Found on floor EXAM: PORTABLE CHEST 1 VIEW COMPARISON:  04/14/2015 FINDINGS: Apical blebs. No focal opacity or pleural effusion. Normal cardiomediastinal silhouette. No pneumothorax IMPRESSION: No active disease. Electronically Signed   By: Donavan Foil M.D.   On: 05/12/2020 19:41    Procedures .Critical Care Performed by: Nakota Elsen, [place] N, PA-C Authorized by: Daric Koren, [place] N, PA-C   Critical care provider statement:    Critical care time (minutes):  60   Critical care time was exclusive of:  Separately billable procedures and treating other patients and teaching time   Critical care was necessary to treat or prevent imminent or life-threatening deterioration of the following conditions:  Shock, sepsis and dehydration   Critical care was time spent personally by me on the following activities:  Discussions with consultants, evaluation of patient's response to treatment, examination of  patient, ordering and performing treatments and interventions, ordering and review of laboratory studies, ordering and review of radiographic studies, pulse oximetry, re-evaluation of patient's condition, obtaining history from patient or surrogate and review of old charts   I assumed direction of critical care for this patient from another provider in my specialty: no     Care discussed with: admitting provider       Medications Ordered in ED Medications  lactated ringers infusion ( Intravenous New Bag/Given 05/12/20 1905)  lactated ringers bolus 1,000 mL (has no administration in time range)  norepinephrine (LEVOPHED) 4mg  in 25mL premix infusion (0 mcg/min Intravenous Stopped 05/13/20 0055)  ceFEPIme (MAXIPIME) 2 g in sodium chloride 0.9 % 100 mL IVPB (0 g Intravenous Stopped 05/12/20 2015)  metroNIDAZOLE (FLAGYL) IVPB 500 mg (0 mg Intravenous Stopped 05/12/20 2025)  lactated ringers bolus 1,000 mL (0 mLs Intravenous Stopped 05/12/20 2025)  vancomycin (VANCOREADY) IVPB 1500 mg/300 mL (0 mg Intravenous Stopped 05/12/20 2225)  lactated ringers bolus 1,000 mL (0 mLs Intravenous Stopped 05/13/20 0036)    ED Course  I have reviewed the triage vital signs and the nursing notes.  Pertinent labs & imaging results that were available during my care of the patient were reviewed by me and considered in my medical decision making (see chart for details).    MDM Rules/Calculators/A&P                          Patient with history of HIV, rectal cancer, brought in by EMS for altered mental status.  Found down on the floor in his bathroom.  Suspect to be for a couple of days.  He is hypothermic on arrival, placed on [facility].  Tachycardic, tachypneic.  Initially was concern for hypoxia and he was on nonrebreather on arrival, however once better waveform was obtained, patient was weaned off of oxygen altogether with normal O2 saturation.  He has postsurgical changes to the right groin though does have very  edematous scrotum and perineum.  He also has decubitus ulcer.  Initially he is oriented to person and place, able to follow simple commands.  Concern for sepsis with presentation of patient's groin as well as vital signs on arrival with altered mental status.  Sepsis order set is utilized with 30 cc/kg IV fluids and broad-spectrum antibiotics. Imaging ordered.  We will continue to monitor closely.  Attending physician Dr. Zenia Resides made aware of patient's presentation and acuity   Hypothermia: sepsis vs exposure Broad spectrum abx 30cc/kg IVF for lactic acidosis, tachycardia, dehydration Consider cellulitis in groin as possible source of infection?   CT AP  without intra-abdominal/intrapelvic infection.  Chest x-ray negative for pneumonia.  CT head shows small parafalcine subdural.  Consult placed to neurosurgery.  COVID and influenza swab is negative.  UA is not consistent for infection.  Discussed with neurosurgery, Glenford Peers. Recommends CT findings are insignificant in patient clinical picture, does not need surgical intervention at this time.   Core body temperature monitor with temp Foley, temperature normalizing.  Suspect sepsis due to cellulitis of the groin.  0004 Patient reevaluated at shift change.  Change in mental status has occurred.  He is now only able to tell me his name.  He is unable to tell me where he is or his correct birthdate.  He is also not following commands now.  Pressures have down trended to 80s over 60s with MAP 70.  Discussed with attending physician Dr. Tamera Punt. Will start levophed, goal SBP >90 and <120 (considering subdural), repeat head CT with acute decline in mental status. Dr. Wyvonnia Dusky to follow repeat head CT. Critical care to admit.   Final Clinical Impression(s) / ED Diagnoses Final diagnoses:  Septic shock [facility])    Rx / DC Orders ED Discharge Orders    None       Jatavia Keltner, [place] N, PA-C 05/14/20 0032    Lacretia Leigh, MD 05/17/20  1335

## 2020-05-12 NOTE — ED Provider Notes (Signed)
I provided a substantive portion of the care of this patient.  I personally performed the entirety of the medical decision making for this encounter.    60 year old male presents with altered mental status and weakness.  Patient found to be hypothermic and tachycardic.  He was also hypoxic.  Likely sepsis.  Labs and imaging pending at this time.  Patient will require admission.   Lacretia Leigh, MD 05/12/20 [phone]

## 2020-05-12 NOTE — Sepsis Progress Note (Signed)
Notified bedside nurse of need to draw repeat lactic acid. 

## 2020-05-12 NOTE — ED Notes (Signed)
RT at bedside to attempt to obtain ABG

## 2020-05-12 NOTE — ED Notes (Signed)
Pt 02 at 100% on non-rebreather

## 2020-05-13 ENCOUNTER — Emergency Department (HOSPITAL_COMMUNITY): Payer: Medicare Other

## 2020-05-13 ENCOUNTER — Inpatient Hospital Stay (HOSPITAL_COMMUNITY): Payer: Medicare Other

## 2020-05-13 DIAGNOSIS — M009 Pyogenic arthritis, unspecified: Secondary | ICD-10-CM | POA: Diagnosis present

## 2020-05-13 DIAGNOSIS — A401 Sepsis due to streptococcus, group B: Secondary | ICD-10-CM | POA: Diagnosis present

## 2020-05-13 DIAGNOSIS — B955 Unspecified streptococcus as the cause of diseases classified elsewhere: Secondary | ICD-10-CM

## 2020-05-13 DIAGNOSIS — I5030 Unspecified diastolic (congestive) heart failure: Secondary | ICD-10-CM | POA: Diagnosis not present

## 2020-05-13 DIAGNOSIS — Z888 Allergy status to other drugs, medicaments and biological substances status: Secondary | ICD-10-CM | POA: Diagnosis not present

## 2020-05-13 DIAGNOSIS — Z7189 Other specified counseling: Secondary | ICD-10-CM | POA: Diagnosis not present

## 2020-05-13 DIAGNOSIS — T380X5A Adverse effect of glucocorticoids and synthetic analogues, initial encounter: Secondary | ICD-10-CM | POA: Diagnosis present

## 2020-05-13 DIAGNOSIS — Z85048 Personal history of other malignant neoplasm of rectum, rectosigmoid junction, and anus: Secondary | ICD-10-CM | POA: Diagnosis not present

## 2020-05-13 DIAGNOSIS — Z20822 Contact with and (suspected) exposure to covid-19: Secondary | ICD-10-CM | POA: Diagnosis present

## 2020-05-13 DIAGNOSIS — Z96641 Presence of right artificial hip joint: Secondary | ICD-10-CM | POA: Diagnosis present

## 2020-05-13 DIAGNOSIS — E860 Dehydration: Secondary | ICD-10-CM | POA: Insufficient documentation

## 2020-05-13 DIAGNOSIS — A419 Sepsis, unspecified organism: Secondary | ICD-10-CM

## 2020-05-13 DIAGNOSIS — E8770 Fluid overload, unspecified: Secondary | ICD-10-CM | POA: Insufficient documentation

## 2020-05-13 DIAGNOSIS — Z681 Body mass index (BMI) 19 or less, adult: Secondary | ICD-10-CM | POA: Diagnosis not present

## 2020-05-13 DIAGNOSIS — R1310 Dysphagia, unspecified: Secondary | ICD-10-CM | POA: Diagnosis present

## 2020-05-13 DIAGNOSIS — E872 Acidosis, unspecified: Secondary | ICD-10-CM

## 2020-05-13 DIAGNOSIS — L03317 Cellulitis of buttock: Secondary | ICD-10-CM

## 2020-05-13 DIAGNOSIS — I76 Septic arterial embolism: Secondary | ICD-10-CM | POA: Diagnosis present

## 2020-05-13 DIAGNOSIS — G9341 Metabolic encephalopathy: Secondary | ICD-10-CM | POA: Diagnosis present

## 2020-05-13 DIAGNOSIS — E440 Moderate protein-calorie malnutrition: Secondary | ICD-10-CM | POA: Diagnosis present

## 2020-05-13 DIAGNOSIS — E870 Hyperosmolality and hypernatremia: Secondary | ICD-10-CM | POA: Diagnosis present

## 2020-05-13 DIAGNOSIS — M00251 Other streptococcal arthritis, right hip: Secondary | ICD-10-CM | POA: Diagnosis not present

## 2020-05-13 DIAGNOSIS — Z86718 Personal history of other venous thrombosis and embolism: Secondary | ICD-10-CM | POA: Diagnosis not present

## 2020-05-13 DIAGNOSIS — Z515 Encounter for palliative care: Secondary | ICD-10-CM | POA: Diagnosis not present

## 2020-05-13 DIAGNOSIS — N179 Acute kidney failure, unspecified: Secondary | ICD-10-CM | POA: Diagnosis present

## 2020-05-13 DIAGNOSIS — R739 Hyperglycemia, unspecified: Secondary | ICD-10-CM | POA: Diagnosis present

## 2020-05-13 DIAGNOSIS — T68XXXA Hypothermia, initial encounter: Secondary | ICD-10-CM

## 2020-05-13 DIAGNOSIS — E2740 Unspecified adrenocortical insufficiency: Secondary | ICD-10-CM | POA: Diagnosis present

## 2020-05-13 DIAGNOSIS — W06XXXA Fall from bed, initial encounter: Secondary | ICD-10-CM | POA: Diagnosis present

## 2020-05-13 DIAGNOSIS — L0390 Cellulitis, unspecified: Secondary | ICD-10-CM | POA: Insufficient documentation

## 2020-05-13 DIAGNOSIS — R531 Weakness: Secondary | ICD-10-CM | POA: Diagnosis not present

## 2020-05-13 DIAGNOSIS — D638 Anemia in other chronic diseases classified elsewhere: Secondary | ICD-10-CM | POA: Diagnosis present

## 2020-05-13 DIAGNOSIS — L89150 Pressure ulcer of sacral region, unstageable: Secondary | ICD-10-CM | POA: Diagnosis present

## 2020-05-13 DIAGNOSIS — S065X9A Traumatic subdural hemorrhage with loss of consciousness of unspecified duration, initial encounter: Secondary | ICD-10-CM | POA: Diagnosis present

## 2020-05-13 DIAGNOSIS — M25551 Pain in right hip: Secondary | ICD-10-CM | POA: Diagnosis not present

## 2020-05-13 DIAGNOSIS — R7989 Other specified abnormal findings of blood chemistry: Secondary | ICD-10-CM | POA: Diagnosis not present

## 2020-05-13 DIAGNOSIS — Y92002 Bathroom of unspecified non-institutional (private) residence single-family (private) house as the place of occurrence of the external cause: Secondary | ICD-10-CM | POA: Diagnosis not present

## 2020-05-13 DIAGNOSIS — R6520 Severe sepsis without septic shock: Secondary | ICD-10-CM

## 2020-05-13 DIAGNOSIS — G062 Extradural and subdural abscess, unspecified: Secondary | ICD-10-CM

## 2020-05-13 DIAGNOSIS — L8990 Pressure ulcer of unspecified site, unspecified stage: Secondary | ICD-10-CM | POA: Insufficient documentation

## 2020-05-13 DIAGNOSIS — R7881 Bacteremia: Secondary | ICD-10-CM | POA: Diagnosis not present

## 2020-05-13 DIAGNOSIS — R6521 Severe sepsis with septic shock: Secondary | ICD-10-CM | POA: Diagnosis present

## 2020-05-13 DIAGNOSIS — S065XAA Traumatic subdural hemorrhage with loss of consciousness status unknown, initial encounter: Secondary | ICD-10-CM | POA: Diagnosis present

## 2020-05-13 DIAGNOSIS — B20 Human immunodeficiency virus [HIV] disease: Secondary | ICD-10-CM | POA: Diagnosis present

## 2020-05-13 DIAGNOSIS — D6959 Other secondary thrombocytopenia: Secondary | ICD-10-CM | POA: Diagnosis present

## 2020-05-13 HISTORY — DX: Sepsis, unspecified organism: A41.9

## 2020-05-13 HISTORY — DX: Sepsis, unspecified organism: R65.20

## 2020-05-13 LAB — CBC
HCT: 33.3 % — ABNORMAL LOW (ref 39.0–52.0)
Hemoglobin: 11.3 g/dL — ABNORMAL LOW (ref 13.0–17.0)
MCH: 37.2 pg — ABNORMAL HIGH (ref 26.0–34.0)
MCHC: 33.9 g/dL (ref 30.0–36.0)
MCV: 109.5 fL — ABNORMAL HIGH (ref 80.0–100.0)
Platelets: 47 10*3/uL — ABNORMAL LOW (ref 150–400)
RBC: 3.04 MIL/uL — ABNORMAL LOW (ref 4.22–5.81)
RDW: 16.4 % — ABNORMAL HIGH (ref 11.5–15.5)
WBC: 7.6 10*3/uL (ref 4.0–10.5)
nRBC: 0 % (ref 0.0–0.2)

## 2020-05-13 LAB — BLOOD CULTURE ID PANEL (REFLEXED) - BCID2

## 2020-05-13 LAB — PHOSPHORUS: Phosphorus: 2 mg/dL — ABNORMAL LOW (ref 2.5–4.6)

## 2020-05-13 LAB — GLUCOSE, CAPILLARY
Glucose-Capillary: 56 mg/dL — ABNORMAL LOW (ref 70–99)
Glucose-Capillary: 118 mg/dL — ABNORMAL HIGH (ref 70–99)

## 2020-05-13 LAB — CK
Total CK: 625 U/L — ABNORMAL HIGH (ref 49–397)
Total CK: 622 U/L — ABNORMAL HIGH (ref 49–397)
Total CK: 509 U/L — ABNORMAL HIGH (ref 49–397)

## 2020-05-13 LAB — ECHOCARDIOGRAM COMPLETE
Area-P 1/2: 3.3 cm2
Height: 74 in
S' Lateral: 3.2 cm
Weight: 2257.51 oz

## 2020-05-13 LAB — BASIC METABOLIC PANEL
Anion gap: 15 (ref 5–15)
BUN: 63 mg/dL — ABNORMAL HIGH (ref 6–20)
CO2: 14 mmol/L — ABNORMAL LOW (ref 22–32)
Calcium: 7.6 mg/dL — ABNORMAL LOW (ref 8.9–10.3)
Chloride: 123 mmol/L — ABNORMAL HIGH (ref 98–111)
Creatinine, Ser: 1.79 mg/dL — ABNORMAL HIGH (ref 0.61–1.24)
GFR, Estimated: 43 mL/min — ABNORMAL LOW (ref >60)
Glucose, Bld: 65 mg/dL — ABNORMAL LOW (ref 70–99)
Potassium: 3.2 mmol/L — ABNORMAL LOW (ref 3.5–5.1)
Sodium: 152 mmol/L — ABNORMAL HIGH (ref 135–145)

## 2020-05-13 LAB — BLOOD GAS, ARTERIAL
Acid-base deficit: 7.5 mmol/L — ABNORMAL HIGH (ref 0.0–2.0)
Bicarbonate: 14.4 mmol/L — ABNORMAL LOW (ref 20.0–28.0)
FIO2: 21
O2 Saturation: 97.1 %
Patient temperature: 100.8
pCO2 arterial: 22.3 mmHg — ABNORMAL LOW (ref 32.0–48.0)
pH, Arterial: 7.434 (ref 7.350–7.450)
pO2, Arterial: 103 mmHg (ref 83.0–108.0)

## 2020-05-13 LAB — CORTISOL: Cortisol, Plasma: 64.1 ug/dL

## 2020-05-13 LAB — MAGNESIUM: Magnesium: 1.7 mg/dL (ref 1.7–2.4)

## 2020-05-13 LAB — AMMONIA: Ammonia: 26 umol/L (ref 9–35)

## 2020-05-13 LAB — T-HELPER CELLS (CD4) COUNT (NOT AT ARMC)
CD4 % Helper T Cell: 7 % — ABNORMAL LOW (ref 33–65)
CD4 T Cell Abs: <35 /uL — ABNORMAL LOW (ref 400–1790)

## 2020-05-13 LAB — BRAIN NATRIURETIC PEPTIDE: B Natriuretic Peptide: 1164.9 pg/mL — ABNORMAL HIGH (ref 0.0–100.0)

## 2020-05-13 LAB — MRSA PCR SCREENING: MRSA by PCR: NEGATIVE

## 2020-05-13 MED ORDER — KCL IN DEXTROSE-NACL 20-5-0.45 MEQ/L-%-% IV SOLN
INTRAVENOUS | Status: DC
  Filled 2020-05-13 (×2): qty 1000

## 2020-05-13 MED ORDER — SODIUM CHLORIDE 0.9 % IV SOLN
2.0000 g | INTRAVENOUS | Status: DC
  Administered 2020-05-13: 2 g via INTRAVENOUS
  Filled 2020-05-13 (×2): qty 20

## 2020-05-13 MED ORDER — ONDANSETRON HCL 4 MG/2ML IJ SOLN: 4.0000 mg | Freq: Four times a day (QID) | INTRAMUSCULAR | Status: DC | PRN

## 2020-05-13 MED ORDER — POTASSIUM PHOSPHATES 15 MMOLE/5ML IV SOLN
15.0000 mmol | Freq: Once | INTRAVENOUS | Status: AC
  Administered 2020-05-13: 15 mmol via INTRAVENOUS
  Filled 2020-05-13: qty 5

## 2020-05-13 MED ORDER — NOREPINEPHRINE 4 MG/250ML-% IV SOLN
2.0000 ug/min | INTRAVENOUS | Status: DC
  Administered 2020-05-13: 6 ug/min via INTRAVENOUS
  Administered 2020-05-14: 4 ug/min via INTRAVENOUS
  Administered 2020-05-15: 6 ug/min via INTRAVENOUS
  Administered 2020-05-15 – 2020-05-16 (×2): 3 ug/min via INTRAVENOUS
  Administered 2020-05-17: 6 ug/min via INTRAVENOUS
  Administered 2020-05-18: 5 ug/min via INTRAVENOUS
  Administered 2020-05-19: 4 ug/min via INTRAVENOUS
  Filled 2020-05-13 (×9): qty 250

## 2020-05-13 MED ORDER — SODIUM CHLORIDE 0.9 % IV BOLUS
1000.0000 mL | Freq: Once | INTRAVENOUS | Status: AC
  Administered 2020-05-13: 1000 mL via INTRAVENOUS

## 2020-05-13 MED ORDER — CHLORHEXIDINE GLUCONATE 0.12 % MT SOLN
15.0000 mL | Freq: Two times a day (BID) | OROMUCOSAL | Status: DC
  Administered 2020-05-13 – 2020-05-28 (×28): 15 mL via OROMUCOSAL
  Filled 2020-05-13 (×23): qty 15

## 2020-05-13 MED ORDER — DEXTROSE 50 % IV SOLN
12.5000 g | INTRAVENOUS | Status: AC
  Administered 2020-05-13: 12.5 g via INTRAVENOUS
  Filled 2020-05-13: qty 50

## 2020-05-13 MED ORDER — PANTOPRAZOLE SODIUM 40 MG IV SOLR
40.0000 mg | Freq: Every day | INTRAVENOUS | Status: DC
  Administered 2020-05-13 – 2020-05-16 (×4): 40 mg via INTRAVENOUS
  Filled 2020-05-13 (×4): qty 40

## 2020-05-13 MED ORDER — ORAL CARE MOUTH RINSE
15.0000 mL | Freq: Two times a day (BID) | OROMUCOSAL | Status: DC
  Administered 2020-05-13 – 2020-05-27 (×19): 15 mL via OROMUCOSAL

## 2020-05-13 MED ORDER — CHLORHEXIDINE GLUCONATE CLOTH 2 % EX PADS
6.0000 | MEDICATED_PAD | Freq: Every day | CUTANEOUS | Status: DC
  Administered 2020-05-13 – 2020-05-28 (×16): 6 via TOPICAL

## 2020-05-13 MED ORDER — FUROSEMIDE 10 MG/ML IJ SOLN: 40.0000 mg | Freq: Once | INTRAMUSCULAR | Status: DC

## 2020-05-13 MED ORDER — COLLAGENASE 250 UNIT/GM EX OINT
TOPICAL_OINTMENT | Freq: Every day | CUTANEOUS | Status: DC
  Administered 2020-05-22 – 2020-05-23 (×2): 1 via TOPICAL
  Filled 2020-05-13 (×2): qty 30

## 2020-05-13 MED ORDER — SODIUM CHLORIDE 0.45 % IV SOLN: INTRAVENOUS | Status: DC

## 2020-05-13 MED ORDER — GADOBUTROL 1 MMOL/ML IV SOLN
6.0000 mL | Freq: Once | INTRAVENOUS | Status: AC | PRN
  Administered 2020-05-13: 6 mL via INTRAVENOUS

## 2020-05-13 MED ORDER — DOCUSATE SODIUM 100 MG PO CAPS: 100.0000 mg | ORAL_CAPSULE | Freq: Two times a day (BID) | ORAL | Status: DC | PRN

## 2020-05-13 MED ORDER — SODIUM CHLORIDE 0.9 % IV SOLN
2.0000 g | Freq: Two times a day (BID) | INTRAVENOUS | Status: DC
  Administered 2020-05-13 – 2020-05-28 (×31): 2 g via INTRAVENOUS
  Filled 2020-05-13 (×2): qty 20
  Filled 2020-05-13: qty 2
  Filled 2020-05-13 (×5): qty 20
  Filled 2020-05-13: qty 2
  Filled 2020-05-13: qty 20
  Filled 2020-05-13: qty 2
  Filled 2020-05-13: qty 20
  Filled 2020-05-13: qty 2
  Filled 2020-05-13: qty 20
  Filled 2020-05-13: qty 2
  Filled 2020-05-13 (×2): qty 20
  Filled 2020-05-13: qty 2
  Filled 2020-05-13: qty 20
  Filled 2020-05-13 (×3): qty 2
  Filled 2020-05-13 (×2): qty 20
  Filled 2020-05-13: qty 2
  Filled 2020-05-13 (×6): qty 20

## 2020-05-13 MED ORDER — POLYETHYLENE GLYCOL 3350 17 G PO PACK: 17.0000 g | PACK | Freq: Every day | ORAL | Status: DC | PRN

## 2020-05-13 MED ORDER — SODIUM CHLORIDE 0.9% IV SOLUTION: Freq: Once | INTRAVENOUS | Status: DC

## 2020-05-13 MED ORDER — NOREPINEPHRINE 4 MG/250ML-% IV SOLN
0.0000 ug/min | INTRAVENOUS | Status: DC
  Administered 2020-05-13: 5 ug/min via INTRAVENOUS
  Filled 2020-05-13: qty 250

## 2020-05-13 MED ORDER — SODIUM CHLORIDE 0.9 % IV SOLN
250.0000 mL | INTRAVENOUS | Status: DC
  Administered 2020-05-13 – 2020-05-14 (×3): 250 mL via INTRAVENOUS

## 2020-05-13 NOTE — Progress Notes (Signed)
Pharmacy Electrolyte Replacement  Recent Labs:  Recent Labs    05/13/20 0611 05/13/20 1024  K 3.2*  --   MG 1.7  --   PHOS  --  2.0*  CREATININE 1.79*  --     Low Critical Values (K </= 2.5, Phos </= 1, Mg </= 1) Present: none  MD Contacted: Dr Lake Bells  Plan:  KPhos 15 mmol IV x1 dose.  (Provides ~22 mEq K+)  Gretta Arab PharmD, BCPS Clinical Pharmacist [facility] main pharmacy [phone] 05/13/2020 2:46 PM

## 2020-05-13 NOTE — Progress Notes (Signed)
eLink Physician-Brief Progress Note Patient Name: [name] [name] DOB: Oct 21, 1960 MRN: [id number]   Date of Service  05/13/2020  HPI/Events of Note  Patient admitted through the ED for altered mental status and found to have septic shock likely from cellulitis, he also had a small sub-dural hematoma which neurosurgery reportedly felt was clinically insignificant. Patient has a history of HIV + and was extremely volume depleted on admission.  eICU Interventions  New Patient Evaluation.        Kerry Kass [place]blood 05/13/2020, 6:11 AM

## 2020-05-13 NOTE — Progress Notes (Signed)
  Echocardiogram 2D Echocardiogram has been performed.  [name] [name] 05/13/2020, 2:51 PM

## 2020-05-13 NOTE — Progress Notes (Signed)
Noted obtained patient from ED on AIR BED. PT presents on 5 mg of Levo via #20 angio cath in right arm, with NS at 125 piggy backed in line.  Pt is non verbal just moans to simple questions asked and when patient is being moved. Pt will not identify area of discomfort or level of discomfort. NOTED SAME SOUNDS MADE WHEN PATIENT IS STARTLED. Noted  bed sores documented on patients upper back, sacral area, bi-lateral heels and elbows, with a fissure present coccyx area . On going assessment

## 2020-05-13 NOTE — Progress Notes (Signed)
Noted trending decrease in SDB/P. Titration of  Levo and it is currently at 70mcq/min/kg

## 2020-05-13 NOTE — Consult Note (Addendum)
[place] Nurse Consult Note: Reason for Consult: Consult requested for sacrum wound. Reviewed progress notes and discussed with patient's mother at the bedside; Pt has a complex medical history to this location. He had a significant pressure injury to the same site in 2015 which required closure by the plastic surgery team and it eventually healed.  He also has been receiving radiation, which has contributed to full thickness maceration wounds to the perirectal area and frequent liquid stool leakage to the same location.  He has seen a colorectal surgeon and oncologist for this chronic problem.  He has had several recent falls and now presents with an Unstageable pressure injury to the sacrum.  Pt is critically ill in ICU. Pressure Injury POA: Yes Measurement: 4X5cm, 100% tightly adhered black eschar, no odor, drainage, or fluctuance.  Full thickness maceration wounds surrounding the rectum are approx 6X6X.2cm, red and moist Right posterior shoulder with dark purple deep tissue pressure injury 5X3cm Middle back with dark purple deep tissue pressure injury 7X3cm Refer to nursing wound flowsheet for other pressure injures which were present on admission and I have not been requested to assess. Dressing procedure/placement/frequency: Discussed plan of care with mother at the bedside; deep tissue injuries frequently evolve into full thickness skin loss, and the Unstageable wound may be difficult to promote healing related to the multiple systemic factors which can impair healing. She verbalized understanding.  Pt is on a low airloss mattress to reduce pressure. Topical treatment orders provided for bedside nurses to perform as follows: Barrier cream to protect perirectal area. Apply Santyl to sacrum wound Q day, then cover with moist gauze and foam dressing.  (Change foam dressing Q 3 days or PRN soiling.) to assist with enzymatic debridement of nonviable tissue. Apply foam dressings to areas of deep tissue  pressure injuries and change Q 3 days or PRN soiling Please re-consult if further assistance is needed.  Thank-you,  Julien Girt MSN, [place], [place], [place], [place]

## 2020-05-13 NOTE — ED Provider Notes (Signed)
Patient seen by critical care Dr. Carson Myrtle.  He favors profound dehydration rather than sepsis.  Patient's map is greater than 70 off of Levophed.  He recommends more IV fluids and no further vasopressors. He recommends hospitalist admission and will consult.  CT head is stable appearance of subdural.  Seen by neurosurgery who feels no surgical intervention.  Source is felt to be cellulitis and pressure ulcers of pelvis and buttocks. No gas collection or drainable abscess on CT scan.   Before patient could be discussed with hospitalist service, BP trending down again and levophed restarted. Patient has received >4 liters of IVF.  D/w Dr. Carson Myrtle who will admit patient to ICU.   Marland KitchenCritical Care Performed by: Ezequiel Essex, MD Authorized by: Ezequiel Essex, MD   Critical care provider statement:    Critical care time (minutes):  45   Critical care was necessary to treat or prevent imminent or life-threatening deterioration of the following conditions:  Sepsis and shock   Critical care was time spent personally by me on the following activities:  Discussions with consultants, evaluation of patient's response to treatment, examination of patient, ordering and performing treatments and interventions, ordering and review of laboratory studies, ordering and review of radiographic studies, pulse oximetry, re-evaluation of patient's condition, obtaining history from patient or surrogate and review of old charts      Ezequiel Essex, MD 05/13/20 [phone]

## 2020-05-13 NOTE — Plan of Care (Signed)

## 2020-05-13 NOTE — Progress Notes (Signed)
NEUROSURGERY PROGRESS NOTE  Called in regards to this patients CT head. Patient is apparently admitted for sepsis. CT head shows a very small SDH along the posterior falx without midline shift or mass effect. No surgical intervention is warranted. Probably no need for follow up head CT  Unless patient has a neurologic decline  Temp:  [92.7 F (33.7 C)-98.6 F (37 C)] 98.6 F (37 C) (05/05 0000) Pulse Rate:  [31-159] 107 (05/05 0000) Resp:  [21-35] 30 (05/05 0000) BP: (77-188)/(64-170) 101/68 (05/05 0000) SpO2:  [81 %-100 %] 100 % (05/05 0000) Weight:  [64 kg] 64 kg (05/04 1821)  Eleonore Chiquito, NP 05/13/2020 12:09 AM

## 2020-05-13 NOTE — ED Notes (Signed)
Patient was put back on pressors due to blood pressure.

## 2020-05-13 NOTE — Progress Notes (Signed)
NAME:  [name] [name], MRN:  [id number], DOB:  1960-06-06, LOS: 0 ADMISSION DATE:  05/12/2020, CONSULTATION DATE:  5/4 REFERRING MD:  Rancour, CHIEF COMPLAINT:  Found down   History of Present Illness:  60 y/o male admitted from the [facility] ER on 5/4 after being found down.  Noted to have sepsis physiology and a subdural hemorrhage.  NSGY reported to the ER that there was no indication for an intervention.    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  Repeat head CT showed stable SDH.  Comment was also made of a hide midline parietal scalp swelling 5/5 cefepime >  5/5 flagyl/vanc x1   Interim History / Subjective:  Admitted overnight Remains confused Bacteremia noted on blood culture Sister reports one week of gi complaints (predominantly upset stomach), fell and landed on his right hip She is concerned that he has been drinking more and had a hard time identifying all of his medications  Objective   Blood pressure (!) 73/53, pulse 91, temperature (!) 100.4 F (38 C), resp. rate (!) 27, height 6\' 2"  (1.88 m), weight 64 kg, SpO2 98 %.        Intake/Output Summary (Last 24 hours) at 05/13/2020 1018 Last data filed at 05/13/2020 0600 Gross per 24 hour  Intake 6043.6 ml  Output 450 ml  Net 5593.6 ml   Filed Weights   05/12/20 1821  Weight: 64 kg    Examination:  General:  Resting comfortably in bed HENT: NCAT OP clear PULM: CTA B, normal effort CV: RRR, no mgr GI:  BS+, soft, nontender MSK: normal bulk and tone Derm: cyanotic change/bruising noted over feet bilaterally, they are warm to the touch Neuro: awake, alert, no distress, MAEW   Labs/imaging that I havepersonally reviewed  (right click and "Reselect all SmartList Selections" daily)  5/4 blood > strep species 4/4 bottles 5/4 SARS COV 2/Flu > negative  Resolved Hospital Problem list     Assessment & Plan:  Septic shock due to strep bacteremia> 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  DDx includes skin/soft tissue source but this seems less likely as there is no evidence of underlying fluid collection under the wound, needs echocardiogram to rule out endocarditis History of B strep septic hip/psoas abscess > continue levophed > TTE for now, will need TEE at some point > restart antibiotics, cefepime ordered > MRI R hip to look for evidence of prosthetic joint infection > ID consult  Fall with head trauma causing sub-dural hematoma, stable on head imaging, by report NSGY says no intervention needed after discussion with ER > repeat head imaging if mental status worsens or new neurologic findings  Thrombocytopenia: unclear cause, labs not suggestive of DIC; HIV related? > transfuse 1 U platelet now given the intracranial hemorrhage  Acute encephalopathy> mixed picture, metabolic cause from sepsis but confounded by head injury; doesn't appear to be in alcohol withdrawal but at risk for that; some improvement with fluids, antibiotics  but he is far from his baseline > no sedating medications > monitor carefully in ICU setting > if no improvement on 5/6 check MRI brain as he is at risk for embolic phenomena from bacteremia  Sacral wound, chronic, present on admission > WOC consult  Hypernatremia, AKI Hypokalemia > Continue IV fluids, change to D5 1/2 NS with 20 mEq KCl > Monitor BMET and UOP > Replace  electrolytes as needed  HIV > is he compliant with medications? Lymphopenia noted on CBC > check CD4 count > hold home antivirals until he is able to take medications by mouth  History of anal cancer, recent biopsy by Dr. Dema Severin negative for malignancy > no intervention right now  Skin changes feet> Kaposi's?  > f/u CD4 count > outpatient work up   [employer] (right click and "Production designer, theatre/television/film" daily)  Diet:  NPO Pain/Anxiety/Delirium protocol (if indicated): No VAP protocol (if indicated): Not indicated DVT prophylaxis: SCD GI prophylaxis: N/A Glucose control:  SSI No Central venous access:  N/A Arterial line:  N/A Foley:  N/A Mobility:  bed rest  PT consulted: N/A Last date of multidisciplinary goals of care discussion [5/5: I spoke to his sister [name] and asked that she and the rest of the family let us know if he wants full code (CPR/mechanical ventilation) if his condition declines.  She will let us know the outcome of that conversation.] Code Status:  full code Disposition: remain in ICU  Labs   CBC: Recent Labs  Lab 05/12/20 1827 05/13/20 0611  WBC 15.5* 7.6  NEUTROABS 14.0*  --   HGB 15.0 11.3*  HCT 45.0 33.3*  MCV 112.2* 109.5*  PLT 77* 47*    Basic Metabolic Panel: Recent Labs  Lab 05/12/20 1827 05/13/20 0611  NA 149* 152*  K 4.7 3.2*  CL 117* 123*  CO2 15* 14*  GLUCOSE 102* 65*  BUN 70* 63*  CREATININE 2.05* 1.79*  CALCIUM 8.1* 7.6*  MG  --  1.7   GFR: Estimated Creatinine Clearance: 40.7 mL/min (A) (by C-G formula based on SCr of 1.79 mg/dL (H)). Recent Labs  Lab 05/12/20 1827 05/12/20 2227 05/13/20 0611  WBC 15.5*  --  7.6  LATICACIDVEN 3.4* 2.9*  --     Liver Function Tests: Recent Labs  Lab 05/12/20 1827  AST 101*  ALT 66*  ALKPHOS 76  BILITOT 2.5*  PROT 6.9  ALBUMIN 2.5*   No results for input(s): LIPASE, AMYLASE in the last 168 hours. Recent Labs  Lab 05/13/20 0120  AMMONIA 26    ABG     Component Value Date/Time   PHART 7.434 05/13/2020 0420   PCO2ART 22.3 (L) 05/13/2020 0420   PO2ART 103 05/13/2020 0420   HCO3 14.4 (L) 05/13/2020 0420   TCO2 22.8 08/05/2013 0350   ACIDBASEDEF 7.5 (H) 05/13/2020 0420   O2SAT 97.1 05/13/2020 0420     Coagulation Profile: Recent Labs  Lab 05/12/20 1827  INR 1.2    Cardiac Enzymes: Recent Labs  Lab 05/12/20 1827 05/13/20 0611  CKTOTAL 449* 625*    HbA1C: Hgb A1c MFr Bld  Date/Time Value Ref Range Status  08/14/2013 05:00 AM 5.9 (H) <5.7 % Final    Comment:    (NOTE)  According to the ADA Clinical Practice Recommendations for 2011, when HbA1c is used as a screening test:  >=6.5%   Diagnostic of Diabetes Mellitus           (if abnormal result is confirmed) 5.7-6.4%   Increased risk of developing Diabetes Mellitus References:Diagnosis and Classification of Diabetes Mellitus,Diabetes QPYP,9509,32(IZTIW 1):S62-S69 and Standards of Medical Care in         Diabetes - 2011,Diabetes PYKD,9833,82 (Suppl 1):S11-S61.    CBG: Recent Labs  Lab 05/13/20 0823 05/13/20 0852  GLUCAP 56* 118*     Critical care time: 40 minutes    Roselie Awkward, MD [facility] Pager: [phone] Cell: [phone] If no response, please call [phone] until 7pm After 7:00 pm call Elink  [phone]

## 2020-05-13 NOTE — Consult Note (Signed)
Date of Admission:  05/12/2020          Reason for Consult: Up to coccal bacteremia with septic shock in a patient with HIV, prior prosthetic joint infection prior severe decubitus ulcer    Referring Provider: Dr Lake Bells   Assessment:  1. Streptococcal bacteremia with septic shock 2. Rule out endocarditis 3. Subdural hematoma--but could be empyema 4. Alcohol abuse 5. Multiple  Largely unstageable decubitus ulcers 6. History of rectal cancer status post recent biopsy that did not show any evidence of malignancy 7. 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 History of deep sacral decubitus ulcer that required debridement hyperbaric oxygen and plastic surgery 9. History of HIV with multidrug-resistant HIV and intermittent , though largely good adherence    Plan:  1. We will narrow to ceftriaxone but dose it at 2 g every 12 for CNS dosing 2. Get 2D echocardiogram 3. Agree with MRI of the hip 4. Would also get MRI of the brain when possible  wonder if the subdural hematoma is potentially subdural empyema 5. We will check viral load CD4 and HIV genotype 6. Will not be able to start his antiretrovirals until he either improves enough to be able to swallow pills or he has a feeding tube placed. (would consider treating with boosted PI such as Prezcobix or Symtuza (and just include NRTI's) + Tivicay with 2 drug therapy vs keeping an NNRTI on board 7. WOC has left recs re his multiple wounds  Principal Problem:   Septic shock ([place]) Active Problems:   Human immunodeficiency virus (HIV) disease ([place])   AKI (acute kidney injury) ([place])   Lactic acidosis   SDH (subdural hematoma) (HCC)   Hypothermia due to exposure   Cellulitis   Elevated brain natriuretic peptide (BNP) level   Volume overload   Dehydration   Pressure injury of skin   Scheduled Meds: . sodium chloride   Intravenous Once  . chlorhexidine  15 mL Mouth Rinse BID  . Chlorhexidine Gluconate Cloth   6 each Topical Daily  . collagenase   Topical Daily  . mouth rinse  15 mL Mouth Rinse q12n4p  . pantoprazole (PROTONIX) IV  40 mg Intravenous QHS   Continuous Infusions: . sodium chloride 250 mL (05/13/20 0702)  . cefTRIAXone (ROCEPHIN)  IV    . dextrose 5 % and 0.45 % NaCl with KCl 20 mEq/L    . norepinephrine (LEVOPHED) Adult infusion 10 mcg/min (05/13/20 1221)   PRN Meds:.docusate sodium, ondansetron (ZOFRAN) IV, polyethylene glycol   NOTE HISTORY IS PROVIDED BY SISTER AND BY READING THROUGH CHART AS PT IS UNRESPONSIVE  HPI: [name] [name] is a 60 y.o. 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  He is on a salvage regimen for his HIV which includes Prezcobix, Tivicay and edurant.  Patient has been having quite painful lesions in his rectum and pain with defecation and blood with bowel movements.  He was evaluated by oncology and gastroenterology.  He underwent exam on March 21 under anesthesia that found fixed fibrotic soft tissue in the ischiorectal fossa nodular thickening and granularity with excisional biopsies were taken that did  not reveal malignancy.  Also had a gluteal punch biopsy that showed no dysplasia or milk malignancy.   Apparently according to his sister he had been experiencing continued pain in particular after his biopsy.  She believes he may not of been moving as much because of it.  His sister ultimately found him on the day of admission lying unconscious on the bathroom floor.  It was unknown how long he had been down.  His sister discovered multiple  beer cans and bottles of heavy liquor throughout the house as well as medications that were scattered throughout the house.  She said that in the past he had been much more careful and always able to hide alcohol consumption from her when especially when he lived with her.  There is no history of intravenous drug use to the sister's knowledge largely alcohol and occasional marijuana use.  Was hypoxic initially and placed on nasal cannula brought to the emergency department.  Was GCS was initially initially 14   CT of the head showed a small subdural hematoma (though this could also be a subdural empyema that this is not well seen on noncontrasted CT of the head)  Neurosurgery were consulted and felt that this was not need of any neurosurgical attention.  Since then he is deteriorated.  He became hypotensive developed septic shock and is encephalopathic.  Blood cultures were taken and he was started on broad-spectrum antibiotics in the form of vancomycin Flagyl and cefepime.  Blood cultures have since yielded a streptococcal species yet to be identified but likely in the viridans group of streptococcal species.  As mentioned above he had a prior history of prosthetic hip infection on the right and according to his sister he had had more pain at that site after he had fallen on it recently.  He had apparently had nerve pain that he was having difficulty raising his leg and asked to shower at her house instead of his own.   With regards to his multidrug-resistant HIV compile the comp as it "cumulative genotype" doing [employer]:  Drug resistance interpretation: PR  HIVDB 9.0 (2019-03-03)  PI Major Resistance Mutations: None PI Accessory Resistance Mutations: None Other Mutations: I13V, K14R, L63P Protease Inhibitors atazanavir/r (ATV/r) Susceptible darunavir/r (DRV/r) Susceptible lopinavir/r (LPV/r) Susceptible  Drug resistance interpretation: RT  HIVDB 9.0  (2019-03-03)  NRTI Resistance Mutations:  A62V, K65R, L74I, M184V  NNRTI Resistance Mutations:  K103N, P225H A98S, I135T, I142V, S162N, S163T, T200A, V245E, E248N, I293V, I329L  Nucleoside Reverse Transcriptase Inhibitors  abacavir (ABC) High-Level Resistance zidovudine (AZT) Susceptible emtricitabine (FTC) High-Level Resistance lamivudine (3TC) High-Level Resistance tenofovir (TDF) High-Level Resistance Non-nucleoside Reverse Transcriptase Inhibitors doravirine (DOR) Intermediate Resistance efavirenz (EFV) High-Level Resistance etravirine (ETR) Susceptible nevirapine (NVP) High-Level Resistance rilpivirine (RPV) Susceptible   We will narrow his antibiotics to ceftriaxone 2 g every 12 hours to maximize CNS penetration  Dr. Lake Bells is getting an MRI of his hip  I would also get an MRI of the brain when possible to see if this area is hematoma or whether it might be an empyema.  We will get a 2D echocardiogram.  We will need to build swallow pills or will need a feeding tube in order to build to start antiretroviral therapy again.  In the interim we will check a viral load CD4 count and genotype  I spent greater than 120 minutes with the patient including greater than 50% of time in face to face counsel of the patient, patient's surrogate you  have the electronic medical records including personal review of radiographs, laboratory data and in coordination of his care with Critical Care team and ID pharmacy.    Review of Systems: Review of Systems  Unable to perform ROS: Critical illness  Genitourinary: Dysuria:     Past Medical History:  Diagnosis Date  . Arthritis   . Decubitus ulcer of sacral area   . DVT (deep venous thrombosis) ([place]) 2008   Left leg  . Exposure to hepatitis B   . H/O hypotension   . History of anemia   . History of gout   . History of transfusion   . 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 HIV (human immunodeficiency virus  infection) ([place])   . Hx of sepsis 2015   affected rt hip / femur  . Pain in limb 07/23/2013  . PONV (postoperative nausea and vomiting)    Hiccups for 4 days after anesthesia  . Radiation   . 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 Shingles    left shoulder, right leg    Social History   Tobacco Use  . Smoking status: Current Every Day Smoker    Packs/day: 0.30    Types: Cigarettes  . Smokeless tobacco: Never Used  Vaping Use  . Vaping Use: Never used  Substance Use Topics  . Alcohol use: Yes    Alcohol/week: 3.0 standard drinks    Types: 3 Standard drinks or equivalent per week  . Drug use: Yes    Types: Marijuana    Comment: occ.    Family History  Problem Relation Age of Onset  . Diabetes Mother   . Dementia Mother   . Atrial fibrillation Mother   . Hypertension Mother   . Cancer Father   . Emphysema Father    Allergies  Allergen Reactions  . Collagen Rash    redness    OBJECTIVE: Blood pressure 100/69, pulse 85, temperature 98.6 F (37 C), resp. rate (!) 25, height 6\' 2"  (1.88 m), weight 64 kg, SpO2 99 %.  Physical Exam Constitutional:      Appearance: He is toxic-appearing.  HENT:     Head: Normocephalic.     Mouth/Throat:     Pharynx: Oropharynx is clear.  Cardiovascular:     Rate and Rhythm: Normal rate and regular rhythm.     Heart sounds: No murmur heard. No friction rub. No gallop.   Pulmonary:     Effort: No respiratory distress.     Breath sounds: No stridor. No wheezing or rhonchi.  Abdominal:     General: Bowel sounds are normal. There is no distension.     Palpations: There is no mass.    Wound care had just finished examining him and dressing his wounds which are multiple and listed in their note.  He was completely obtunded  His neck is somewhat craned back.  He moaned as if he was in pain when I tried to manipulate his left leg or right leg.  His prosthetic hip incision site is seems clean on exam  Lab Results Lab  Results  Component Value Date   WBC 7.6 05/13/2020   HGB 11.3 (L) 05/13/2020   HCT 33.3 (L) 05/13/2020   MCV 109.5 (H) 05/13/2020   PLT 47 (L) 05/13/2020    Lab Results  Component Value Date   CREATININE 1.79 (H) 05/13/2020   BUN 63 (H) 05/13/2020   NA 152 (H) 05/13/2020  K 3.2 (L) 05/13/2020   CL 123 (H) 05/13/2020   CO2 14 (L) 05/13/2020    Lab Results  Component Value Date   ALT 66 (H) 05/12/2020   AST 101 (H) 05/12/2020   ALKPHOS 76 05/12/2020   BILITOT 2.5 (H) 05/12/2020     Microbiology: Recent Results (from the past 240 hour(s))  Blood Culture (routine x 2)     Status: None (Preliminary result)   Collection Time: 05/12/20  6:45 PM   Specimen: BLOOD  Result Value Ref Range Status   Specimen Description   Final    BLOOD RIGHT WRIST Performed at [facility] [address], [place], [place] [address]    Special Requests   Final    BOTTLES DRAWN AEROBIC AND ANAEROBIC Blood Culture adequate volume   Culture  Setup Time   Final    GRAM POSITIVE COCCI IN BOTH AEROBIC AND ANAEROBIC BOTTLES CRITICAL RESULT CALLED TO, READ BACK BY AND VERIFIED WITH: PHARM D L.Wynona Canes ON [id number] AT 0850 BY E.PARRISH Performed at [facility], [place] [address], [place], [place] [address]    Culture GRAM POSITIVE COCCI  Final   Report Status PENDING  Incomplete  Blood Culture ID Panel (Reflexed)     Status: Abnormal   Collection Time: 05/12/20  6:45 PM  Result Value Ref Range Status   Enterococcus faecalis NOT DETECTED NOT DETECTED Final   Enterococcus Faecium NOT DETECTED NOT DETECTED Final   Listeria monocytogenes NOT DETECTED NOT DETECTED Final   Staphylococcus species NOT DETECTED NOT DETECTED Final   Staphylococcus aureus (BCID) NOT DETECTED NOT DETECTED Final   Staphylococcus epidermidis NOT DETECTED NOT DETECTED Final   Staphylococcus lugdunensis NOT DETECTED NOT DETECTED Final   Streptococcus species DETECTED (A) NOT DETECTED Final    Comment: Not  Enterococcus species, Streptococcus agalactiae, Streptococcus pyogenes, or Streptococcus pneumoniae. CRITICAL RESULT CALLED TO, READ BACK BY AND VERIFIED WITH: PHARM D L.POINDEXTER ON [id number] AT 0850 BY E.PARRISH    Streptococcus agalactiae NOT DETECTED NOT DETECTED Final   Streptococcus pneumoniae NOT DETECTED NOT DETECTED Final   Streptococcus pyogenes NOT DETECTED NOT DETECTED Final   A.calcoaceticus-baumannii NOT DETECTED NOT DETECTED Final   Bacteroides fragilis NOT DETECTED NOT DETECTED Final   Enterobacterales NOT DETECTED NOT DETECTED Final   Enterobacter cloacae complex NOT DETECTED NOT DETECTED Final   Escherichia coli NOT DETECTED NOT DETECTED Final   Klebsiella aerogenes NOT DETECTED NOT DETECTED Final   Klebsiella oxytoca NOT DETECTED NOT DETECTED Final   Klebsiella pneumoniae NOT DETECTED NOT DETECTED Final   Proteus species NOT DETECTED NOT DETECTED Final   Salmonella species NOT DETECTED NOT DETECTED Final   Serratia marcescens NOT DETECTED NOT DETECTED Final   Haemophilus influenzae NOT DETECTED NOT DETECTED Final   Neisseria meningitidis NOT DETECTED NOT DETECTED Final   Pseudomonas aeruginosa NOT DETECTED NOT DETECTED Final   Stenotrophomonas maltophilia NOT DETECTED NOT DETECTED Final   Candida albicans NOT DETECTED NOT DETECTED Final   Candida auris NOT DETECTED NOT DETECTED Final   Candida glabrata NOT DETECTED NOT DETECTED Final   Candida krusei NOT DETECTED NOT DETECTED Final   Candida parapsilosis NOT DETECTED NOT DETECTED Final   Candida tropicalis NOT DETECTED NOT DETECTED Final   Cryptococcus neoformans/gattii NOT DETECTED NOT DETECTED Final    Comment: Performed at [facility], [address] [place],  [address]  Blood Culture (routine x 2)     Status: None (Preliminary result)   Collection Time:  05/12/20  6:57 PM   Specimen: BLOOD  Result Value Ref Range Status   Specimen Description   Final    BLOOD LEFT ANTECUBITAL Performed at  [facility] [address], [place], [place] [address]    Special Requests   Final    BOTTLES DRAWN AEROBIC AND ANAEROBIC Blood Culture adequate volume   Culture  Setup Time   Final    GRAM POSITIVE COCCI CRITICAL VALUE NOTED.  VALUE IS CONSISTENT WITH PREVIOUSLY REPORTED AND CALLED VALUE. IN BOTH AEROBIC AND ANAEROBIC BOTTLES Performed at [facility], [place] [address] [place], [place] [address]    Culture GRAM POSITIVE COCCI  Final   Report Status PENDING  Incomplete  Resp Panel by RT-PCR (Flu A&B, Covid) Nasopharyngeal Swab     Status: None   Collection Time: 05/12/20  7:00 PM   Specimen: Nasopharyngeal Swab; Nasopharyngeal(NP) swabs in vial transport medium  Result Value Ref Range Status   SARS Coronavirus 2 by RT PCR NEGATIVE NEGATIVE Final    Comment: (NOTE) SARS-CoV-2 target nucleic acids are NOT DETECTED.  The SARS-CoV-2 RNA is generally detectable in upper respiratory specimens during the acute phase of infection. The lowest concentration of SARS-CoV-2 viral copies this assay can detect is 138 copies/mL. A negative result does not preclude SARS-Cov-2 infection and should not be used as the sole basis for treatment or other patient management decisions. A negative result may occur with  improper specimen collection/handling, submission of specimen other than nasopharyngeal swab, presence of viral mutation(s) within the areas targeted by this assay, and inadequate number of viral copies(<138 copies/mL). A negative result must be combined with clinical observations, patient history, and epidemiological information. The expected result is Negative.  Fact Sheet for Patients:  EntrepreneurPulse.com.au  Fact Sheet for Healthcare Providers:  IncredibleEmployment.be  This test is no t yet approved or cleared by the [place] FDA and  has been authorized for detection and/or diagnosis of SARS-CoV-2 by FDA under  an Emergency Use Authorization (EUA). This EUA will remain  in effect (meaning this test can be used) for the duration of the COVID-19 declaration under Section 564(b)(1) of the Act, 21 U.S.C.section 360bbb-3(b)(1), unless the authorization is terminated  or revoked sooner.       Influenza A by PCR NEGATIVE NEGATIVE Final   Influenza B by PCR NEGATIVE NEGATIVE Final    Comment: (NOTE) The Xpert Xpress SARS-CoV-2/FLU/RSV plus assay is intended as an aid in the diagnosis of influenza from Nasopharyngeal swab specimens and should not be used as a sole basis for treatment. Nasal washings and aspirates are unacceptable for Xpert Xpress SARS-CoV-2/FLU/RSV testing.  Fact Sheet for Patients: EntrepreneurPulse.com.au  Fact Sheet for Healthcare Providers: IncredibleEmployment.be  This test is not yet approved or cleared by the [place] FDA and has been authorized for detection and/or diagnosis of SARS-CoV-2 by FDA under an Emergency Use Authorization (EUA). This EUA will remain in effect (meaning this test can be used) for the duration of the COVID-19 declaration under Section 564(b)(1) of the Act, 21 U.S.C. section 360bbb-3(b)(1), unless the authorization is terminated or revoked.  Performed at [facility], [place] [address], [place], [place] [address]   MRSA PCR Screening     Status: None   Collection Time: 05/13/20  5:28 AM   Specimen: Nasopharyngeal  Result Value Ref Range Status   MRSA by PCR NEGATIVE NEGATIVE Final    Comment:        The GeneXpert MRSA  Assay (FDA approved for NASAL specimens only), is one component of a comprehensive MRSA colonization surveillance program. It is not intended to diagnose MRSA infection nor to guide or monitor treatment for MRSA infections. Performed at [facility], [place] [address], [place], [place] [address]     Alcide Evener, [place] for  [facility] [place] [facility] [phone] pager  05/13/2020, 12:32 PM

## 2020-05-13 NOTE — Progress Notes (Signed)
PHARMACY - PHYSICIAN COMMUNICATION CRITICAL VALUE ALERT - BLOOD CULTURE IDENTIFICATION (BCID)  [name] [name] is an 60 y.o. male who presented to [facility] on [facility]/12/2020 with a chief complaint of sepsis  Assessment:  BCx: 3/4 bottles GPC.  BCID: Strep species.  negative for S. pneumoniae, S. agalactiae, and S. Pyogenes.  Streptococcus species (may be Streptococcus mutans, bovis, mitis, anginosus, equisimilis.  Name of physician (or Provider) Contacted: Dr Lake Bells  Current antibiotics: none (one time doses given in ED on 5/4)  Changes to prescribed antibiotics recommended:  Recommendations accepted by provider:  Ceftriaxone 2g IV q24h  Results for orders placed or performed during the hospital encounter of 05/12/20  Blood Culture ID Panel (Reflexed) (Collected: 05/12/2020  6:45 PM)  Result Value Ref Range   Enterococcus faecalis NOT DETECTED NOT DETECTED   Enterococcus Faecium NOT DETECTED NOT DETECTED   Listeria monocytogenes NOT DETECTED NOT DETECTED   Staphylococcus species NOT DETECTED NOT DETECTED   Staphylococcus aureus (BCID) NOT DETECTED NOT DETECTED   Staphylococcus epidermidis NOT DETECTED NOT DETECTED   Staphylococcus lugdunensis NOT DETECTED NOT DETECTED   Streptococcus species DETECTED (A) NOT DETECTED   Streptococcus agalactiae NOT DETECTED NOT DETECTED   Streptococcus pneumoniae NOT DETECTED NOT DETECTED   Streptococcus pyogenes NOT DETECTED NOT DETECTED   A.calcoaceticus-baumannii NOT DETECTED NOT DETECTED   Bacteroides fragilis NOT DETECTED NOT DETECTED   Enterobacterales NOT DETECTED NOT DETECTED   Enterobacter cloacae complex NOT DETECTED NOT DETECTED   Escherichia coli NOT DETECTED NOT DETECTED   Klebsiella aerogenes NOT DETECTED NOT DETECTED   Klebsiella oxytoca NOT DETECTED NOT DETECTED   Klebsiella pneumoniae NOT DETECTED NOT DETECTED   Proteus species NOT DETECTED NOT DETECTED   Salmonella species NOT DETECTED NOT DETECTED   Serratia marcescens NOT DETECTED  NOT DETECTED   Haemophilus influenzae NOT DETECTED NOT DETECTED   Neisseria meningitidis NOT DETECTED NOT DETECTED   Pseudomonas aeruginosa NOT DETECTED NOT DETECTED   Stenotrophomonas maltophilia NOT DETECTED NOT DETECTED   Candida albicans NOT DETECTED NOT DETECTED   Candida auris NOT DETECTED NOT DETECTED   Candida glabrata NOT DETECTED NOT DETECTED   Candida krusei NOT DETECTED NOT DETECTED   Candida parapsilosis NOT DETECTED NOT DETECTED   Candida tropicalis NOT DETECTED NOT DETECTED   Cryptococcus neoformans/gattii NOT DETECTED NOT DETECTED    Leeroy Bock 05/13/2020  9:10 AM

## 2020-05-13 NOTE — H&P (Signed)
NAME:  [name] [name] MRN:  [id number] DOB:  [birth year] LOS: 0 ADMISSION DATE:  05/12/2020 DATE OF SERVICE:  05/13/2020  CHIEF COMPLAINT: Altered mental status  HISTORY & PHYSICAL  History of Present Illness  This 60 y.o. Caucasian male presented to [facility] emergency department via EMS.  Patient's family reported that he was found on the bathroom floor at home, unknown downtime.  The patient was last seen 2 days ago.  Discussion of the case with Dr. Wyvonnia Dusky reveals that the patient initially arrived with GCS 14.  He was felt to be hypoxic and was placed on high flow nasal cannula.  On initial assessment, he was found to be hypothermic.  He was initially normotensive.  He was administered a "sepsis bolus" and subsequently became hypotensive (80s over 60s), prompting initiation of norepinephrine.  The patient had a head CT which revealed a small subdural hematoma.  Neurosurgery reported to the emergency department that they felt the subdural hematoma was not of clinical significance (verbal report, no documentation in the chart).  At the time of clinical interview, the patient is awake.  He is confused but is able to participate in clinical interview.  He realizes he is in the hospital but does not know why.  He reports pain "all over".  During his emergency department stay, he has been started on low-dose norepinephrine, prompting request for ICU admission.  He was started on cefepime/Flagyl/vancomycin in the emergency department.  REVIEW OF SYSTEMS Constitutional: Cachexia. No night sweats. No fever. No chills. No fatigue. HEENT: No headaches, dysphagia, sore throat, otalgia, nasal congestion, PND CV:  No chest pain, orthopnea, PND, swelling in lower extremities, palpitations GI:  No abdominal pain, nausea, vomiting, diarrhea, change in bowel pattern, anorexia Resp: No DOE, rest dyspnea, cough, mucus, hemoptysis, wheezing  GU: no dysuria, change in color of urine, no urgency  or frequency.  No flank pain. MS:  No joint pain or swelling. No myalgias,  No decreased range of motion.  Psych:  No change in mood or affect. No memory loss. Skin: Swelling and excoriation of the scrotum.  History of lymphogranuloma venereum.  Past Medical/Surgical/Social/Family History   Past Medical History:  Diagnosis Date  . Arthritis   . Decubitus ulcer of sacral area   . DVT (deep venous thrombosis) ([place]) 2008   Left leg  . Exposure to hepatitis B   . H/O hypotension   . History of anemia   . History of gout   . History of transfusion   . 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 HIV (human immunodeficiency virus infection) ([place])   . Hx of sepsis 2015   affected rt hip / femur  . Pain in limb 07/23/2013  . PONV (postoperative nausea and vomiting)    Hiccups for 4 days after anesthesia  . Radiation   . 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 Shingles    left shoulder, right leg    Past Surgical History:  Procedure Laterality Date  . COLONOSCOPY    . CONVERSION TO TOTAL HIP Right 06/15/2014   Procedure: REMOVAL OF ANTIBIOTIC  SPACER AND CONVERSION TO RIGHT TOTAL HIP  ARTHROPLASTY ;  Surgeon: Paralee Cancel, MD;  [facility]: [facility];  Service: Orthopedics;  Laterality: Right;  . CYSTOSCOPY    . HEMATOMA EVACUATION    . HERNIA REPAIR     right X2  . INCISION AND DRAINAGE HIP Right 07/31/2013  Procedure: IRRIGATION AND DEBRIDEMENT HIP WITH PERCUTANEOUS DRAIN PLACEMENT;  Surgeon: Verlee RossettiSteven R Norris, MD;  [facility];  Service: Orthopedics;  Laterality: Right;  . INCISION AND DRAINAGE HIP Right 03/15/2015   Procedure: IRRIGATION AND DEBRIDEMENT RIGHT HIP WITH  HEAD BALL  AND POLY LINER EXCHANGE;  Surgeon: Durene RomansMatthew Olin, MD;  [facility];  Service: Orthopedics;  Laterality: Right;  . INCISION AND DRAINAGE OF WOUND N/A 08/25/2013   Procedure: IRRIGATION AND DEBRIDEMENT OF SACRAL ULCER WITH PLACEMENT OF A CELL ;  Surgeon: Wayland Denislaire Sanger, DO;  Location: [facility];   Service: Plastics;  Laterality: N/A;  . INCISION AND DRAINAGE OF WOUND N/A 09/01/2013   Procedure: IRRIGATION AND DEBRIDEMENT SACRAL ULCER WITH PLACEMENT OF A CELL;  Surgeon: Wayland Denislaire Sanger, DO;  Location: [facility];  Service: Plastics;  Laterality: N/A;  . INCISION AND DRAINAGE OF WOUND N/A 09/08/2013   Procedure: IRRIGATION AND DEBRIDEMENT OF SACRAL ULCER WITH PLACEMENT OF A CELL AND VAC;  Surgeon: Wayland Denislaire Sanger, DO;  Location: [facility];  Service: Plastics;  Laterality: N/A;  . INCISION AND DRAINAGE OF WOUND N/A 09/18/2013   Procedure: IRRIGATION AND DEBRIDEMENT OF SACRAL WOUND WITH PLACEMENT OF A-CELL;  Surgeon: Wayland Denislaire Sanger, DO;  Location: [facility];  Service: Plastics;  Laterality: N/A;  . PILONIDAL CYST DRAINAGE N/A 08/09/2013   Procedure: IRRIGATION AND DEBRIDEMENT SACRAL DECUBITUS;  Surgeon: Shelly Rubensteinouglas A Blackman, MD;  [facility];  Service: General;  Laterality: N/A;  . RECTAL BIOPSY N/A 03/29/2020   Procedure: EXCISION OF PERIANAL TISSUE WITH BX AND 3 PUNCH BXS;  Surgeon: Andria MeuseWhite, Christopher M, MD;  [facility];  Service: General;  Laterality: N/A;  . TOTAL HIP ARTHROPLASTY Right 08/04/2013   Procedure: RESECTION HIP JOINT - PLACEMENT OF CEMENT PROSTHESIS;  Surgeon: Shelda PalMatthew D Olin, MD;  [facility];  Service: Orthopedics;  Laterality: Right;  . TRANSURETHRAL RESECTION OF PROSTATE  JAN 2016    Social History   Tobacco Use  . Smoking status: Current Every Day Smoker    Packs/day: 0.30    Types: Cigarettes  . Smokeless tobacco: Never Used  Substance Use Topics  . Alcohol use: Yes    Alcohol/week: 3.0 standard drinks    Types: 3 Standard drinks or equivalent per week    Family History  Problem Relation Age of Onset  . Diabetes Mother   . Dementia Mother   . Atrial fibrillation Mother   . Hypertension Mother   . Cancer Father   . Emphysema Father      Procedures:     Significant Diagnostic Tests:  5/5: Head CT showed posterior parafalcine subdural hematoma.  Repeat head CT  showed stable SDH.  Comment was also made of a hide midline parietal scalp swelling.   Micro Data:   Results for orders placed or performed during the hospital encounter of 05/12/20  Blood Culture (routine x 2)     Status: None (Preliminary result)   Collection Time: 05/12/20  6:45 PM   Specimen: BLOOD  Result Value Ref Range Status   Specimen Description   Final    BLOOD RIGHT WRIST Performed at [facility], [address] [place], [place] [address]    Special Requests   Final    BOTTLES DRAWN AEROBIC AND ANAEROBIC Blood Culture adequate volume Performed at [facility], [address] [place], [place] [address]    Culture PENDING  Incomplete   Report Status PENDING  Incomplete  Blood Culture (routine x 2)  Status: None (Preliminary result)   Collection Time: 05/12/20  6:57 PM   Specimen: BLOOD  Result Value Ref Range Status   Specimen Description   Final    BLOOD LEFT ANTECUBITAL Performed at [place] [address] [place], [place] [address]    Special Requests   Final    BOTTLES DRAWN AEROBIC AND ANAEROBIC Blood Culture adequate volume Performed at [facility], [place] [address], [place], [place] [address]    Culture PENDING  Incomplete   Report Status PENDING  Incomplete  Resp Panel by RT-PCR (Flu A&B, Covid) Nasopharyngeal Swab     Status: None   Collection Time: 05/12/20  7:00 PM   Specimen: Nasopharyngeal Swab; Nasopharyngeal(NP) swabs in vial transport medium  Result Value Ref Range Status   SARS Coronavirus 2 by RT PCR NEGATIVE NEGATIVE Final    Comment: (NOTE) SARS-CoV-2 target nucleic acids are NOT DETECTED.  The SARS-CoV-2 RNA is generally detectable in upper respiratory specimens during the acute phase of infection. The lowest concentration of SARS-CoV-2 viral copies this assay can detect is 138 copies/mL. A negative result does not preclude SARS-Cov-2 infection and should not be used as the sole  basis for treatment or other patient management decisions. A negative result may occur with  improper specimen collection/handling, submission of specimen other than nasopharyngeal swab, presence of viral mutation(s) within the areas targeted by this assay, and inadequate number of viral copies(<138 copies/mL). A negative result must be combined with clinical observations, patient history, and epidemiological information. The expected result is Negative.  Fact Sheet for Patients:  EntrepreneurPulse.com.au  Fact Sheet for Healthcare Providers:  IncredibleEmployment.be  This test is no t yet approved or cleared by the [place] FDA and  has been authorized for detection and/or diagnosis of SARS-CoV-2 by FDA under an Emergency Use Authorization (EUA). This EUA will remain  in effect (meaning this test can be used) for the duration of the COVID-19 declaration under Section 564(b)(1) of the Act, 21 U.S.C.section 360bbb-3(b)(1), unless the authorization is terminated  or revoked sooner.       Influenza A by PCR NEGATIVE NEGATIVE Final   Influenza B by PCR NEGATIVE NEGATIVE Final    Comment: (NOTE) The Xpert Xpress SARS-CoV-2/FLU/RSV plus assay is intended as an aid in the diagnosis of influenza from Nasopharyngeal swab specimens and should not be used as a sole basis for treatment. Nasal washings and aspirates are unacceptable for Xpert Xpress SARS-CoV-2/FLU/RSV testing.  Fact Sheet for Patients: EntrepreneurPulse.com.au  Fact Sheet for Healthcare Providers: IncredibleEmployment.be  This test is not yet approved or cleared by the [place] FDA and has been authorized for detection and/or diagnosis of SARS-CoV-2 by FDA under an Emergency Use Authorization (EUA). This EUA will remain in effect (meaning this test can be used) for the duration of the COVID-19 declaration under Section 564(b)(1) of the Act,  21 U.S.C. section 360bbb-3(b)(1), unless the authorization is terminated or revoked.  Performed at [facility], [place] [address] [place], [place] [address]       Antimicrobials:  Cefepime/Flagyl/vancomycin (5/5>>)   Interim history/subjective:     Objective   BP 125/80   Pulse (!) 103   Temp 98.9 F (37.2 C)   Resp (!) 30   Ht 6\' 2"  (1.88 m)   Wt 64 kg   SpO2 100%   BMI 18.12 kg/m     Filed Weights   05/12/20 1821  Weight: 64 kg    Intake/Output  Summary (Last 24 hours) at 05/13/2020 0334 Last data filed at 05/13/2020 5366 Gross per 24 hour  Intake 4505.94 ml  Output --  Net 4505.94 ml        Examination: GENERAL: alert, disoriented to time, pleasant.  Cachectic. No acute distress. HEAD: normocephalic, atraumatic EYE: PERRLA, EOM intact, no scleral icterus, no pallor. THROAT/ORAL CAVITY:.  Desiccated mucous membranes.  Poor dentition. NECK: supple, no thyromegaly, no JVD, no lymphadenopathy. Trachea midline. CHEST/LUNG: symmetric in development and expansion. Good air entry.  Rales and rhonchi bilaterally.   HEART: Regular S1 and S2 without murmur, rub or gallop. ABDOMEN: soft, nontender, nondistended. Normoactive bowel sounds. No rebound. No guarding. No hepatosplenomegaly. EXTREMITIES: Edema: none. No cyanosis.  No clubbing. 2+ DP pulses.  Diffuse bilateral lower extremity tenderness without erythema (chronic). LYMPHATIC: no cervical/axillary/inguinal lymph nodes appreciated MUSCULOSKELETAL: Bilateral lower extremity tenderness. Bulk atrophy. Joints: Normal inspection.  SKIN: Swollen scrotum with excoriations.  Unstageable sacral pressure sore.   NEUROLOGIC: Doll's eyes intact. Corneal reflex intact. Spontaneous respirations intact. Cranial nerves II-XII are grossly symmetric and physiologic. Babinski absent. No sensory deficit. Motor: 5/5 @ RUE, 5/5 @ LUE, 5/5 @ RLL,  5/5 @ LLL.  DTR: 2+ @ R biceps, 2+ @ L biceps, 2+ @ R patellar,  2+ @ L  patellar. No cerebellar signs. Gait was not assessed.   Resolved Hospital Problem list      Assessment & Plan:   ASSESSMENT/PLAN:  ASSESSMENT (included in the Hospital Problem List)  Principal Problem:   Septic shock ([place]) Active Problems:   Cellulitis   Dehydration   AKI (acute kidney injury) ([place])   Lactic acidosis   Hypothermia due to exposure   Elevated brain natriuretic peptide (BNP) level   Volume overload   Human immunodeficiency virus (HIV) disease (HCC)   SDH (subdural hematoma) (HCC)   By systems: PULMONARY: No acute issues  Supplemental oxygen as needed to maintain SPO2 93+%  Expectorated sputum for gram stain, C/S.   CARDIOVASCULAR Hypotension secondary to septic shock and dehydration  Physical exam findings and normalization of his lactate with ongoing hypotension suggests dehydration is likely a major component of his hypotension.    Continue IV fluids.  Titrate norepinephrine to maintain MAP 65+.   RENAL Acute kidney injury  Avoid nephrotoxic drugs.    Renal dosing of medications.    Place Foley catheter.  Monitor urine output.  Strict I/O.     GASTROINTESTINAL: No acute issues  History of rectal cancer  GI PROPHYLAXIS: Protonix   HEMATOLOGIC  DVT PROPHYLAXIS: SCDs.  Chemoprophylaxis is contraindicated with SDH.   INFECTIOUS HIV positive, reports adherence to HAART. Cellulitis  Continue cefepime/Flagyl/vancomycin   NEUROLOGIC Subdural hematoma  Consult neurosurgery in a.m.   ENDOCRINE: No acute issues   PLAN/RECOMMENDATIONS   Admit to ICU under my service (Attending: [name] Pain, MD) with the diagnoses highlighted above in the active Hospital Problem List (ASSESSMENT).    My assessment, plan of care, findings, medications, side effects, etc. were discussed with: nurse and Dr. Wyvonnia Dusky (Emergency medicine).   Best practice:  Diet: Regular Pain/Anxiety/Delirium protocol (if indicated): Not indicated VAP protocol  (if indicated): Not applicable DVT prophylaxis: SCDs GI prophylaxis: Protonix Glucose control: Not applicable Mobility/Activity: Bedrest for now   Code Status: Full Code Family Communication:  no family at bedside Disposition: admit to ICU   Labs   CBC: Recent Labs  Lab 05/12/20 1827  WBC 15.5*  NEUTROABS 14.0*  HGB 15.0  HCT 45.0  MCV 112.2*  PLT 77*    Basic Metabolic Panel: Recent Labs  Lab 05/12/20 1827  NA 149*  K 4.7  CL 117*  CO2 15*  GLUCOSE 102*  BUN 70*  CREATININE 2.05*  CALCIUM 8.1*   GFR: Estimated Creatinine Clearance: 35.6 mL/min (A) (by C-G formula based on SCr of 2.05 mg/dL (H)). Recent Labs  Lab 05/12/20 1827 05/12/20 2227  WBC 15.5*  --   LATICACIDVEN 3.4* 2.9*    Liver Function Tests: Recent Labs  Lab 05/12/20 1827  AST 101*  ALT 66*  ALKPHOS 76  BILITOT 2.5*  PROT 6.9  ALBUMIN 2.5*   No results for input(s): LIPASE, AMYLASE in the last 168 hours. Recent Labs  Lab 05/13/20 0120  AMMONIA 26    ABG    Component Value Date/Time   PHART 7.301 (L) 05/12/2020 1845   PCO2ART 21.5 (L) 05/12/2020 1845   PO2ART 331 (H) 05/12/2020 1845   HCO3 10.4 (L) 05/12/2020 1845   TCO2 22.8 08/05/2013 0350   ACIDBASEDEF 14.4 (H) 05/12/2020 1845   O2SAT 99.5 05/12/2020 1845     Coagulation Profile: Recent Labs  Lab 05/12/20 1827  INR 1.2    Cardiac Enzymes: Recent Labs  Lab 05/12/20 1827  CKTOTAL 449*    HbA1C: Hgb A1c MFr Bld  Date/Time Value Ref Range Status  08/14/2013 05:00 AM 5.9 (H) <5.7 % Final    Comment:    (NOTE)                                                                       According to the ADA Clinical Practice Recommendations for 2011, when HbA1c is used as a screening test:  >=6.5%   Diagnostic of Diabetes Mellitus           (if abnormal result is confirmed) 5.7-6.4%   Increased risk of developing Diabetes Mellitus References:Diagnosis and Classification of Diabetes  [place],Diabetes [id number] 1):S62-S69 and Standards of Medical Care in         Diabetes - 2011,Diabetes [id number] (Suppl 1):S11-S61.    CBG: No results for input(s): GLUCAP in the last 168 hours.   Past Medical History   Past Medical History:  Diagnosis Date  . Arthritis   . Decubitus ulcer of sacral area   . DVT (deep venous thrombosis) ([place]) 2008   Left leg  . Exposure to hepatitis B   . H/O hypotension   . History of anemia   . History of gout   . History of transfusion   . 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 HIV (human immunodeficiency virus infection) ([place])   . Hx of sepsis 2015   affected rt hip / femur  . Pain in limb 07/23/2013  . PONV (postoperative nausea and vomiting)    Hiccups for 4 days after anesthesia  . Radiation   . 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 Shingles    left shoulder, right leg      Surgical History    Past Surgical History:  Procedure Laterality Date  . COLONOSCOPY    . CONVERSION TO TOTAL HIP Right 06/15/2014   Procedure: REMOVAL OF ANTIBIOTIC  SPACER AND CONVERSION TO RIGHT TOTAL HIP  ARTHROPLASTY ;  Surgeon: Paralee Cancel, MD;  [facility];  Service: Orthopedics;  Laterality: Right;  . CYSTOSCOPY    . HEMATOMA EVACUATION    . HERNIA REPAIR     right X2  . INCISION AND DRAINAGE HIP Right 07/31/2013   Procedure: IRRIGATION AND DEBRIDEMENT HIP WITH PERCUTANEOUS DRAIN PLACEMENT;  Surgeon: [name], MD;  Location: [facility];  Service: Orthopedics;  Laterality: Right;  . INCISION AND DRAINAGE HIP Right 03/15/2015   Procedure: IRRIGATION AND DEBRIDEMENT RIGHT HIP WITH  HEAD BALL  AND POLY LINER EXCHANGE;  Surgeon: Paralee Cancel, MD;  [facility];  Service: Orthopedics;  Laterality: Right;  . INCISION AND DRAINAGE OF WOUND N/A 08/25/2013   Procedure: IRRIGATION AND DEBRIDEMENT OF SACRAL ULCER WITH PLACEMENT OF A CELL ;  Surgeon: Theodoro Kos, DO;  Location: [facility];  Service: Plastics;  Laterality: N/A;   . INCISION AND DRAINAGE OF WOUND N/A 09/01/2013   Procedure: IRRIGATION AND DEBRIDEMENT SACRAL ULCER WITH PLACEMENT OF A CELL;  Surgeon: Theodoro Kos, DO;  Location: [place];  Service: Plastics;  Laterality: N/A;  . INCISION AND DRAINAGE OF WOUND N/A 09/08/2013   Procedure: IRRIGATION AND DEBRIDEMENT OF SACRAL ULCER WITH PLACEMENT OF A CELL AND VAC;  Surgeon: Theodoro Kos, DO;  Location: [facility];  Service: Plastics;  Laterality: N/A;  . INCISION AND DRAINAGE OF WOUND N/A 09/18/2013   Procedure: IRRIGATION AND DEBRIDEMENT OF SACRAL WOUND WITH PLACEMENT OF A-CELL;  Surgeon: Theodoro Kos, DO;  Location: [place];  Service: Plastics;  Laterality: N/A;  . PILONIDAL CYST DRAINAGE N/A 08/09/2013   Procedure: IRRIGATION AND DEBRIDEMENT SACRAL DECUBITUS;  Surgeon: Harl Bowie, MD;  Location: [place];  Service: General;  Laterality: N/A;  . RECTAL BIOPSY N/A 03/29/2020   Procedure: EXCISION OF PERIANAL TISSUE WITH BX AND 3 PUNCH BXS;  Surgeon: Ileana Roup, MD;  [facility];  Service: General;  Laterality: N/A;  . TOTAL HIP ARTHROPLASTY Right 08/04/2013   Procedure: RESECTION HIP JOINT - PLACEMENT OF CEMENT PROSTHESIS;  Surgeon: [name], MD;  Location: [place];  Service: Orthopedics;  Laterality: Right;  . TRANSURETHRAL RESECTION OF PROSTATE  JAN 2016      Social History   Social History   Socioeconomic History  . Marital status: Single    Spouse name: Not on file  . Number of children: 0  . Years of education: BA  . Highest education level: Not on file  Occupational History  . Occupation: disabled    Employer: [employer]  Tobacco Use  . Smoking status: Current Every Day Smoker    Packs/day: 0.30    Types: Cigarettes  . Smokeless tobacco: Never Used  Vaping Use  . Vaping Use: Never used  Substance and Sexual Activity  . Alcohol use: Yes    Alcohol/week: 3.0 standard drinks    Types: 3 Standard drinks or equivalent per week  . Drug use: Yes    Types: Marijuana     Comment: occ.  Marland Kitchen Sexual activity: Never    Partners: Male    Comment: declined condoms  Other Topics Concern  . Not on file  Social History Narrative  . Not on file   Social Determinants of Health   Financial Resource Strain: Not on file  Food Insecurity: Not on file  Transportation Needs: Not on file  Physical Activity: Not on file  Stress: Not on file  Social Connections: Not on file      Family History  Family History  Problem Relation Age of Onset  . Diabetes Mother   . Dementia Mother   . Atrial fibrillation Mother   . Hypertension Mother   . Cancer Father   . Emphysema Father    family history includes Atrial fibrillation in his mother; Cancer in his father; Dementia in his mother; Diabetes in his mother; Emphysema in his father; Hypertension in his mother.    Allergies Allergies  Allergen Reactions  . Collagen Rash    redness      Current Medications  Current Facility-Administered Medications:  .  lactated ringers infusion, , Intravenous, Continuous, Robinson, [place] N, PA-C, Last Rate: 150 mL/hr at 05/12/20 1905, New Bag at 05/12/20 1905 .  norepinephrine (LEVOPHED) 4mg  in 275mL premix infusion, 0-40 mcg/min, Intravenous, Continuous, Robinson, [place] N, PA-C, Last Rate: 18.75 mL/hr at 05/13/20 0153, 5 mcg/min at 05/13/20 0153  Current Outpatient Medications:  .  chlorproMAZINE (THORAZINE) 25 MG tablet, Take 25 mg by mouth 3 (three) times daily., Disp: , Rfl:  .  darunavir-cobicistat (PREZCOBIX) 800-150 MG tablet, TAKE ONE TABLET BY MOUTH DAILY . SWALLOW WHOLE, DO NOT CRUSH, BREAK, OR CHEW TABLETS. TAKE WITH FOOD (Patient taking differently: Take 1 tablet by mouth daily. SWALLOW WHOLE, DO NOT CRUSH, BREAK, OR CHEW TABLETS. TAKE WITH FOOD), Disp: 30 tablet, Rfl: 5 .  levofloxacin (LEVAQUIN) 500 MG tablet, TAKE 1 TABLET(500 MG) BY MOUTH DAILY (Patient taking differently: Take 500 mg by mouth daily.), Disp: 90 tablet, Rfl: 3 .  rilpivirine (EDURANT) 25 MG TABS  tablet, TAKE ONE TABLET (25 MG TOTAL) BY MOUTH DAILY WITH BREAKFAST (Patient taking differently: Take 25 mg by mouth daily with breakfast.), Disp: 30 tablet, Rfl: 5 .  TIVICAY 50 MG tablet, TAKE ONE TABLET (50 MG TOTAL) BY MOUTH DAILY (Patient taking differently: Take 50 mg by mouth daily.), Disp: 30 tablet, Rfl: 5 .  traMADol (ULTRAM) 50 MG tablet, Take 50 mg by mouth every 6 (six) hours as needed for severe pain., Disp: , Rfl:  .  vitamin C (ASCORBIC ACID) 500 MG tablet, Take 1,000 mg by mouth daily. , Disp: , Rfl:    Home Medications  Prior to Admission medications   Medication Sig Start Date End Date Taking? Authorizing Provider  chlorproMAZINE (THORAZINE) 25 MG tablet Take 25 mg by mouth 3 (three) times daily.    [provider]  darunavir-cobicistat (PREZCOBIX) 800-150 MG tablet TAKE ONE TABLET BY MOUTH DAILY . SWALLOW WHOLE, DO NOT CRUSH, BREAK, OR CHEW TABLETS. TAKE WITH FOOD Patient taking differently: Take 1 tablet by mouth daily. SWALLOW WHOLE, DO NOT CRUSH, BREAK, OR CHEW TABLETS. TAKE WITH FOOD 01/01/20   [name], MD  levofloxacin (LEVAQUIN) 500 MG tablet TAKE 1 TABLET(500 MG) BY MOUTH DAILY Patient taking differently: Take 500 mg by mouth daily. 05/14/18   [name], MD  rilpivirine (EDURANT) 25 MG TABS tablet TAKE ONE TABLET (25 MG TOTAL) BY MOUTH DAILY WITH BREAKFAST Patient taking differently: Take 25 mg by mouth daily with breakfast. 01/01/20   [name], MD  TIVICAY 50 MG tablet TAKE ONE TABLET (50 MG TOTAL) BY MOUTH DAILY Patient taking differently: Take 50 mg by mouth daily. 01/01/20   [name], MD  traMADol (ULTRAM) 50 MG tablet Take 50 mg by mouth every 6 (six) hours as needed for severe pain.    [provider]  vitamin C (ASCORBIC ACID) 500 MG tablet Take 1,000 mg by mouth daily.     [provider]      Critical care time: 45 minutes.  The treatment and management of the patient's condition was required  based on the threat of imminent deterioration. This time reflects time spent by the physician evaluating, providing care and managing the critically ill patient's care. The time was spent at the immediate bedside (or on the same floor/unit and dedicated to this patient's care). Time involved in separately billable procedures is NOT included int he critical care time indicated above. Family meeting and update time may be included above if and only if the patient is unable/incompetent to participate in clinical interview and/or decision making, and the discussion was necessary to determining treatment decisions.   [name] Pain, MD Board Certified by the ABIM, [place]

## 2020-05-13 NOTE — ED Notes (Signed)
Requested md to do consult for unstageable wound on sacrum.

## 2020-05-13 NOTE — ED Notes (Signed)
Patient is not oriented to person, place, and time. Dr. At bedside and is putting in orders

## 2020-05-13 NOTE — Sepsis Progress Note (Signed)
Notified provider of need to order repeat lactic acid. ° °

## 2020-05-14 ENCOUNTER — Inpatient Hospital Stay (HOSPITAL_COMMUNITY): Payer: Medicare Other

## 2020-05-14 ENCOUNTER — Other Ambulatory Visit: Payer: Medicare Other

## 2020-05-14 DIAGNOSIS — F1020 Alcohol dependence, uncomplicated: Secondary | ICD-10-CM

## 2020-05-14 DIAGNOSIS — N179 Acute kidney failure, unspecified: Secondary | ICD-10-CM | POA: Diagnosis not present

## 2020-05-14 DIAGNOSIS — L03317 Cellulitis of buttock: Secondary | ICD-10-CM | POA: Diagnosis not present

## 2020-05-14 DIAGNOSIS — E440 Moderate protein-calorie malnutrition: Secondary | ICD-10-CM | POA: Insufficient documentation

## 2020-05-14 DIAGNOSIS — R7989 Other specified abnormal findings of blood chemistry: Secondary | ICD-10-CM | POA: Diagnosis not present

## 2020-05-14 DIAGNOSIS — B20 Human immunodeficiency virus [HIV] disease: Secondary | ICD-10-CM | POA: Diagnosis not present

## 2020-05-14 DIAGNOSIS — A419 Sepsis, unspecified organism: Secondary | ICD-10-CM | POA: Diagnosis not present

## 2020-05-14 DIAGNOSIS — L89310 Pressure ulcer of right buttock, unstageable: Secondary | ICD-10-CM

## 2020-05-14 DIAGNOSIS — E860 Dehydration: Secondary | ICD-10-CM | POA: Diagnosis not present

## 2020-05-14 LAB — CBC WITH DIFFERENTIAL/PLATELET
Abs Immature Granulocytes: 0.17 10*3/uL — ABNORMAL HIGH (ref 0.00–0.07)
Basophils Absolute: 0 10*3/uL (ref 0.0–0.1)
Basophils Relative: 0 %
Eosinophils Absolute: 0 10*3/uL (ref 0.0–0.5)
Eosinophils Relative: 0 %
HCT: 31.4 % — ABNORMAL LOW (ref 39.0–52.0)
Hemoglobin: 10.6 g/dL — ABNORMAL LOW (ref 13.0–17.0)
Immature Granulocytes: 1 %
Lymphocytes Relative: 13 %
Lymphs Abs: 1.7 10*3/uL (ref 0.7–4.0)
MCH: 36.4 pg — ABNORMAL HIGH (ref 26.0–34.0)
MCHC: 33.8 g/dL (ref 30.0–36.0)
MCV: 107.9 fL — ABNORMAL HIGH (ref 80.0–100.0)
Monocytes Absolute: 0.7 10*3/uL (ref 0.1–1.0)
Monocytes Relative: 5 %
Neutro Abs: 10.8 10*3/uL — ABNORMAL HIGH (ref 1.7–7.7)
Neutrophils Relative %: 81 %
Platelets: 35 10*3/uL — ABNORMAL LOW (ref 150–400)
RBC: 2.91 MIL/uL — ABNORMAL LOW (ref 4.22–5.81)
RDW: 16.8 % — ABNORMAL HIGH (ref 11.5–15.5)
WBC: 13.4 10*3/uL — ABNORMAL HIGH (ref 4.0–10.5)
nRBC: 0 % (ref 0.0–0.2)

## 2020-05-14 LAB — COMPREHENSIVE METABOLIC PANEL
ALT: 42 U/L (ref 0–44)
AST: 73 U/L — ABNORMAL HIGH (ref 15–41)
Albumin: 1.7 g/dL — ABNORMAL LOW (ref 3.5–5.0)
Alkaline Phosphatase: 47 U/L (ref 38–126)
Anion gap: 12 (ref 5–15)
BUN: 67 mg/dL — ABNORMAL HIGH (ref 6–20)
CO2: 17 mmol/L — ABNORMAL LOW (ref 22–32)
Calcium: 7.6 mg/dL — ABNORMAL LOW (ref 8.9–10.3)
Chloride: 121 mmol/L — ABNORMAL HIGH (ref 98–111)
Creatinine, Ser: 1.78 mg/dL — ABNORMAL HIGH (ref 0.61–1.24)
GFR, Estimated: 44 mL/min — ABNORMAL LOW (ref >60)
Glucose, Bld: 157 mg/dL — ABNORMAL HIGH (ref 70–99)
Potassium: 3.3 mmol/L — ABNORMAL LOW (ref 3.5–5.1)
Sodium: 150 mmol/L — ABNORMAL HIGH (ref 135–145)
Total Bilirubin: 2 mg/dL — ABNORMAL HIGH (ref 0.3–1.2)
Total Protein: 4.7 g/dL — ABNORMAL LOW (ref 6.5–8.1)

## 2020-05-14 LAB — SYNOVIAL CELL COUNT + DIFF, W/ CRYSTALS
Crystals, Fluid: NONE SEEN
Eosinophils-Synovial: 0 % (ref 0–1)
Lymphocytes-Synovial Fld: 12 % (ref 0–20)
Monocyte-Macrophage-Synovial Fluid: 27 % — ABNORMAL LOW (ref 50–90)
Neutrophil, Synovial: 61 % — ABNORMAL HIGH (ref 0–25)
WBC, Synovial: 31570 /mm3 — ABNORMAL HIGH (ref 0–200)

## 2020-05-14 LAB — HIV-1 RNA QUANT-NO REFLEX-BLD
HIV 1 RNA Quant: 866000 copies/mL
LOG10 HIV-1 RNA: 5.938 log10copy/mL

## 2020-05-14 LAB — URINE CULTURE

## 2020-05-14 LAB — GLUCOSE, CAPILLARY
Glucose-Capillary: 158 mg/dL — ABNORMAL HIGH (ref 70–99)
Glucose-Capillary: 134 mg/dL — ABNORMAL HIGH (ref 70–99)
Glucose-Capillary: 125 mg/dL — ABNORMAL HIGH (ref 70–99)

## 2020-05-14 LAB — CD4/CD8 (T-HELPER/T-SUPPRESSOR CELL)
CD4 absolute: 107 /uL — ABNORMAL LOW (ref 400–1790)
CD4%: 9 % — ABNORMAL LOW (ref 33–65)
CD8 T Cell Abs: 966 /uL (ref 190–1000)
CD8tox: 76 % — ABNORMAL HIGH (ref 12–40)
Ratio: 0.11 — ABNORMAL LOW (ref 1.0–3.0)
Total lymphocyte count: 1271 /uL (ref 1000–4000)

## 2020-05-14 LAB — PHOSPHORUS
Phosphorus: 1.9 mg/dL — ABNORMAL LOW (ref 2.5–4.6)
Phosphorus: 1.6 mg/dL — ABNORMAL LOW (ref 2.5–4.6)

## 2020-05-14 LAB — PROCALCITONIN: Procalcitonin: 5.81 ng/mL

## 2020-05-14 LAB — CRYPTOCOCCAL ANTIGEN: Crypto Ag: NEGATIVE

## 2020-05-14 LAB — MAGNESIUM
Magnesium: 2 mg/dL (ref 1.7–2.4)
Magnesium: 2 mg/dL (ref 1.7–2.4)

## 2020-05-14 MED ORDER — DOLUTEGRAVIR SODIUM 50 MG PO TABS
50.0000 mg | ORAL_TABLET | Freq: Every day | ORAL | Status: DC
  Filled 2020-05-14: qty 1

## 2020-05-14 MED ORDER — DOLUTEGRAVIR SODIUM 50 MG PO TABS
50.0000 mg | ORAL_TABLET | Freq: Every day | ORAL | Status: DC
  Administered 2020-05-14 – 2020-05-20 (×7): 50 mg
  Filled 2020-05-14 (×8): qty 1

## 2020-05-14 MED ORDER — FREE WATER
200.0000 mL | Status: DC
  Administered 2020-05-14 – 2020-05-19 (×28): 200 mL

## 2020-05-14 MED ORDER — LACTATED RINGERS IV BOLUS
1000.0000 mL | Freq: Once | INTRAVENOUS | Status: AC
  Administered 2020-05-14: 1000 mL via INTRAVENOUS

## 2020-05-14 MED ORDER — DEXTROSE 5 % IV SOLN: INTRAVENOUS | Status: DC

## 2020-05-14 MED ORDER — DARUNAVIR-COBICISTAT 800-150 MG PO TABS
1.0000 | ORAL_TABLET | Freq: Every day | ORAL | Status: DC
  Administered 2020-05-14 – 2020-05-28 (×15): 1 via ORAL
  Filled 2020-05-14 (×15): qty 1

## 2020-05-14 MED ORDER — JUVEN PO PACK
1.0000 | PACK | Freq: Two times a day (BID) | ORAL | Status: DC
  Administered 2020-05-14 – 2020-05-19 (×10): 1
  Filled 2020-05-14 (×10): qty 1

## 2020-05-14 MED ORDER — PROSOURCE TF PO LIQD: 45.0000 mL | Freq: Two times a day (BID) | ORAL | Status: DC

## 2020-05-14 MED ORDER — LIDOCAINE HCL (PF) 1 % IJ SOLN
INTRAMUSCULAR | Status: AC
  Filled 2020-05-14: qty 5

## 2020-05-14 MED ORDER — POTASSIUM CHLORIDE 20 MEQ PO PACK
40.0000 meq | PACK | Freq: Once | ORAL | Status: AC
  Administered 2020-05-14: 40 meq
  Filled 2020-05-14: qty 2

## 2020-05-14 MED ORDER — VITAL HIGH PROTEIN PO LIQD: 1000.0000 mL | ORAL | Status: DC

## 2020-05-14 MED ORDER — PROSOURCE TF PO LIQD
45.0000 mL | Freq: Two times a day (BID) | ORAL | Status: DC
  Administered 2020-05-14 – 2020-05-18 (×9): 45 mL
  Filled 2020-05-14 (×10): qty 45

## 2020-05-14 MED ORDER — DARUNAVIR-COBICISTAT 800-150 MG PO TABS
1.0000 | ORAL_TABLET | Freq: Every day | ORAL | Status: DC
  Filled 2020-05-14: qty 1

## 2020-05-14 MED ORDER — VITAL 1.5 CAL PO LIQD
1000.0000 mL | ORAL | Status: DC
  Administered 2020-05-14 – 2020-05-19 (×5): 1000 mL
  Filled 2020-05-14 (×9): qty 1000

## 2020-05-14 NOTE — TOC Initial Note (Signed)
Transition of Care [facility]) - Initial/Assessment Note    Patient Details  Name: [name] [name] MRN: [id number] Date of Birth: Mar 11, 1960  Transition of Care [facility]) CM/SW Contact:    Leeroy Cha, RN Phone Number: 05/14/2020, 8:40 AM  Clinical Narrative:                 60 y.o. Caucasian male presented to [facility] emergency department via EMS.  Patient's family reported that he was found on the bathroom floor at home, unknown downtime.  The patient was last seen 2 days ago.  Discussion of the case with Dr. Wyvonnia Dusky reveals that the patient initially arrived with GCS 14.  He was felt to be hypoxic and was placed on high flow nasal cannula.  On initial assessment, he was found to be hypothermic.  He was initially normotensive.  He was administered a "sepsis bolus" and subsequently became hypotensive (80s over 60s), prompting initiation of norepinephrine.  The patient had a head CT which revealed a small subdural hematoma.  Neurosurgery reported to the emergency department that they felt the subdural hematoma was not of clinical significance (verbal report, no documentation in the chart).  At the time of clinical interview, the patient is awake.  He is confused but is able to participate in clinical interview.  He realizes he is in the hospital but does not know why.  He reports pain "all over".  During his emergency department stay, he has been started on low-dose norepinephrine, prompting request for ICU admission.  He was started on cefepime/Flagyl/vancomycin in the emergency department. Iv rocephin, iv d51/2ns at 75cc/hr, iv levophed, wbc 13.4 na -150, k 3.3, bun-67/creat 1.78. Plan: to return to home Expected Discharge Plan: Home/Self Care Barriers to Discharge: Continued Medical Work up   Patient Goals and CMS Choice Patient states their goals for this hospitalization and ongoing recovery are:: to go home CMS Medicare.gov Compare Post Acute Care list provided to::  Patient Choice offered to / list presented to : Patient  Expected Discharge Plan and Services Expected Discharge Plan: Home/Self Care   Discharge Planning Services: CM Consult   Living arrangements for the past 2 months: Apartment                                      Prior Living Arrangements/Services Living arrangements for the past 2 months: Apartment Lives with:: Self   Do you feel safe going back to the place where you live?: Yes               Activities of Daily Living Home Assistive Devices/Equipment: Cane (specify quad or straight),Walker (specify type),Wheelchair,Other (Comment) (raised toilet seat, single point cane, front wheeled walker) ADL Screening (condition at time of admission) Patient's cognitive ability adequate to safely complete daily activities?: No Is the patient deaf or have difficulty hearing?: No Does the patient have difficulty seeing, even when wearing glasses/contacts?: No Does the patient have difficulty concentrating, remembering, or making decisions?: Yes Patient able to express need for assistance with ADLs?: Yes Does the patient have difficulty dressing or bathing?: Yes Independently performs ADLs?: No Communication: Independent Dressing (OT): Needs assistance Is this a change from baseline?: Change from baseline, expected to last >3 days Grooming: Independent Feeding: Needs assistance Is this a change from baseline?: Change from baseline, expected to last >3 days Bathing: Needs assistance Is this a change from baseline?: Change from  baseline, expected to last >3 days Toileting: Needs assistance Is this a change from baseline?: Change from baseline, expected to last >3days In/Out Bed: Needs assistance Is this a change from baseline?: Change from baseline, expected to last >3 days Walks in Home: Needs assistance Is this a change from baseline?: Change from baseline, expected to last >3 days Does the patient have difficulty walking  or climbing stairs?: Yes (secondary to weakness) Weakness of Legs: Both Weakness of Arms/Hands: Both  Permission Sought/Granted                  Emotional Assessment Appearance:: Appears stated age Attitude/Demeanor/Rapport: Engaged Affect (typically observed): Calm Orientation: : Oriented to Place,Oriented to Self,Oriented to  Time,Oriented to Situation Alcohol / Substance Use: Not Applicable Psych Involvement: No (comment)  Admission diagnosis:  Sepsis ([place]) [A41.9] Septic shock ([place]) [A41.9, R65.21] Patient Active Problem List   Diagnosis Date Noted  . AKI (acute kidney injury) ([place]) 05/13/2020  . Lactic acidosis 05/13/2020  . SDH (subdural hematoma) ([place]) 05/13/2020  . Hypothermia due to exposure 05/13/2020  . Septic shock ([place]) 05/13/2020  . Cellulitis 05/13/2020  . Elevated brain natriuretic peptide (BNP) level 05/13/2020  . Volume overload 05/13/2020  . Dehydration 05/13/2020  . Pressure injury of skin 05/13/2020  . Subdural empyema   . Streptococcal bacteremia   . Rectal bleeding 02/20/2020  . Abnormal digital rectal exam 02/20/2020  . History of anal cancer 02/20/2020  . Poor dentition 01/16/2017  . Renal failure, chronic 06/10/2014  . Septic arthritis of hip ([place]) 07/31/2013  . Decubitus ulcer of sacral region, stage 2 ([place]) 07/31/2013  . Protein-calorie malnutrition, severe ([place]) 07/31/2013  . Pain in limb 07/23/2013  . Inguinal adenopathy 02/18/2013  . Enlargement of lymph nodes 01/13/2013  . 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 ANEMIA, CHRONIC 03/20/2007  . Dental caries 03/20/2007  . Unspecified severe protein-calorie malnutrition ([place]) 02/14/2007  . DVT 08/22/2006  . 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[place]) 05/14/2006  . ABSCESS, RECTUM 04/11/2006  . CANDIDIASIS, MOUTH ([place]) 02/16/2006  . DIARRHEA 02/16/2006  . Human immunodeficiency virus (HIV) disease ([place]) 10/19/2005  . MOLLUSCUM CONTAGIOSUM 10/19/2005  . GOUT 10/19/2005  . HEPATITIS B, HX OF  10/19/2005  . HERPES ZOSTER, HX OF 10/19/2005   PCP:  [name], MD Pharmacy:   [facility] [place], [address] [place] [address] Phone: [phone] Fax: [phone]  [facility] [place], [place] [address] [place] [address] Phone: [phone] Fax: [phone]  [facility] [place] - [place], [place] - [id number] [place] [address] Phone: [phone] Fax: [phone]     Social Determinants of Health (SDOH) Interventions    Readmission Risk Interventions No flowsheet data found.

## 2020-05-14 NOTE — Progress Notes (Signed)
Received call from blood bank stating that pt had a unit of platelets that had been prepared for pt on 05/13/20 at 1200 and it had not been transfused. Blood bank questioning at this time if platelets are still to be transfused or if they can be released for transfusion for another patient. Upon investigation, noted that pt still has active order to transfuse 1 unit of platelets. Spoke with Elink MD, Dr. Emmit Alexanders and he orders to not transfuse unit of platelets at this time but rather to check CBC and follow up in the am with rounding MD.

## 2020-05-14 NOTE — Progress Notes (Signed)
Spoke with Ortho on call for [facility] (Dr. [name] primary patient) - recommends aspiration of right hip per IR with culture.  Orders placed for aspiration of hip.  Will follow.     Noe Gens, MSN, APRN, NP-C, AGACNP-BC [facility] 05/14/2020, 1:42 PM   Please see [email or web address] for pager details.   From 7A-7P if no response, please call [phone] After hours, please call ELink [phone]

## 2020-05-14 NOTE — Progress Notes (Signed)
NAME:  [name] [name], MRN:  [id number], DOB:  [birth year], LOS: 1 ADMISSION DATE:  05/12/2020, CONSULTATION DATE:  5/4 REFERRING MD:  [place], CHIEF COMPLAINT:  Found down   History of Present Illness:  59 y/o male admitted from the [facility] ER on 5/4 after being found down.  Noted to have sepsis physiology and a subdural hemorrhage.  NSGY reported to the ER that there was no indication for an intervention.    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 CT ABD/Pelvis > no acute intra-abdominal process, evidence of sacral decubitus, slight increase compared to prior but no underlying fluid collection, left hip prosthesis   5/5 CT Head > posterior parafalcine SDH.  Cefepime, vanco, flagyl. Remains confused, GP bacteremia on BCID. Family reported 1 wk hx of GI c/o's, fell landing on right hip, concerned he has been drinking more & difficulties with medication identification.  MRI R Hip > large fluid collection surrounding right hip arthroplasty with minimal peripheral enhancement, fluid extends along the femoral component within the medullary cavity of the proximal femur. Small soft tissue defect along the right aspect of the gluteal cleft, bilateral hydroceles. MRI Brain with multifocal acute to subacute ischemic infarcts involving the bilateral cerebral and cerebellar hemispheres, associated mild scattered petechial hemorrhage - possible central thromboembolic etiology. Small subdural hemorrhage without shift. Chronic small vessel disease. Limited TTE b/c of patient cooperation, but no evidence of vegetation, GI DD. ABX changed to ceftriaxone.   Interim History / Subjective:  Afebrile / WBC 13.4  UC  negative  BC with GPC's in both bottles, strep species detected on BCID  On RA I/O 1.3L UOP, +567ml in last 24 hours  Objective   Blood pressure 118/72, pulse 88, temperature 98.2 F (36.8 C), temperature source Axillary, resp. rate (!) 24, height 6\' 2"  (1.88 m), weight 64 kg, SpO2 98 %.        Intake/Output Summary (Last 24 hours) at 05/14/2020 7510 Last data filed at 05/14/2020 2585 Gross per 24 hour  Intake 1919.06 ml  Output 1350 ml  Net 569.06 ml   Filed Weights   05/12/20 1821 05/13/20 0500  Weight: 64 kg 64 kg    Examination: General: chronically ill appearing adult male lying in bed in NAD  HEENT: MM pink/moist, fair dentition, on RA, pupils 60mm reactive  Neuro: opens eyes to voice, unable to state name/birth date, altered but nods yes when re-oriented, generalized weakness with spontaneous movement CV: s1s2 RRR, SR on monitor, no murmurs apprecaited PULM: non-labored on RA, lungs bilaterally clear GI: soft, bsx4 active  Extremities: warm/dry, no edema  Skin: small petechial hemorrhage noted on face, LE's with areas of purple erythema, sacral decubitus dressing intact    Labs/imaging that I havepersonally reviewed  (right click and "Reselect all SmartList Selections" daily)  BCID BC UC  CBC BMP MRI Hip, MRI Brain  Resolved Hospital Problem list     Assessment & Plan:   Septic Shock due to Strep Bacteremia  Concern for right prosthetic hip infection given recent injury (rolled out of bed onto hip) and pain with history of infection (strep s/p washouts) there in the past.  DDx includes skin/soft tissue source, endocarditis (but not seen  on TTE), & AIDS related opportunistic infections.  History of B strep septic hip/psoas abscess.  Current MRI of right hip with large fluid collection. Has also had sacral decubitus for at least 8 years with healing mixed with opening of wound.  -levophed for MAP >65 -continue ceftriaxone  -appreciate ID input -likely will need  TEE at some point  -have reached out to Dr. [name] for possible hip aspiration, awaiting return call   Fall with head trauma causing SDH Stable on head imaging, by report NSGY says no intervention needed after discussion with ER -MRI as above -follow neuro exam  -correct Na+, electrolyte abnormalities   Thrombocytopenia Labs not suggestive of DIC, ?HIV related. S/p platelets 5/5 -follow CBC  -monitor for bleeding   Acute Metabolic Encephalopathy Mixed picture, metabolic cause from sepsis but confounded by head injury; doesn't appear to be in alcohol withdrawal but at risk for that; some improvement with fluids, antibiotics but he is far from his baseline (works, drives etc) -minimize all sedating medications  -MRI as above, concern for embolic phenomena  -PT/OT efforts when able   Sacral wound, chronic, present on admission Present for at least 8 years -WOC consulted  -pressure relieving measures   Hypernatremia AKI Hypokalemia -D51/2NS with 82mEq at 61ml/hr -Trend BMP / urinary output -Replace electrolytes as indicated -Avoid nephrotoxic agents, ensure adequate renal perfusion  AIDS  Per family, he is very compliant with medications? Lymphopenia noted on CBC. CD4 <35 -appreciate ID, hold home antivirals for now. Defer to ID.   At Risk Malnutrition  -place small bore feeding tube -begin nutrition, free water PT  History of anal cancer, recent biopsy by Dr. Dema Severin negative for malignancy -supportive care   Skin changes feet > Kaposi's?  -per ID    Best practice (right click and "Reselect all SmartList Selections" daily)  Diet:  NPO Pain/Anxiety/Delirium protocol (if indicated): No VAP protocol (if indicated): Not indicated DVT prophylaxis: SCD GI prophylaxis: N/A Glucose control:  SSI No Central venous access:  N/A Arterial line:  N/A Foley:  N/A Mobility:  bed rest  PT consulted: N/A Last date of multidisciplinary goals of care discussion 5/6 -follow up  conversation with sister [name], they have not discussed code status as of yet.  Reviewed reversible / non-reversible illnesses. Discussed his overall state of health and current acute processes.   Code Status:  full code Disposition: ICU   Critical care time: 22 minutes    Noe Gens, MSN, APRN, NP-C, AGACNP-BC [facility] 05/14/2020, 10:14 AM   Please see [email or web address] for pager details.   From 7A-7P if no response, please call [phone] After hours, please call ELink [phone]

## 2020-05-14 NOTE — Progress Notes (Addendum)
Subjective:    Patient is obtunded but did open eyes to his name  Antibiotics:  Anti-infectives (From admission, onward)   Start     Dose/Rate Route Frequency Ordered Stop   05/14/20 1500  darunavir-cobicistat (PREZCOBIX) 800-150 MG per tablet 1 tablet  Status:  Discontinued        1 tablet Oral Daily with breakfast 05/14/20 1414 05/14/20 1418   05/14/20 1500  dolutegravir (TIVICAY) tablet 50 mg  Status:  Discontinued        50 mg Per Tube Daily 05/14/20 1414 05/14/20 1418   05/13/20 1300  cefTRIAXone (ROCEPHIN) 2 g in sodium chloride 0.9 % 100 mL IVPB        2 g 200 mL/hr over 30 Minutes Intravenous Every 12 hours 05/13/20 1207     05/13/20 0930  cefTRIAXone (ROCEPHIN) 2 g in sodium chloride 0.9 % 100 mL IVPB  Status:  Discontinued        2 g 200 mL/hr over 30 Minutes Intravenous Every 24 hours 05/13/20 0924 05/13/20 1207   05/12/20 1845  ceFEPIme (MAXIPIME) 2 g in sodium chloride 0.9 % 100 mL IVPB        2 g 200 mL/hr over 30 Minutes Intravenous  Once 05/12/20 1832 05/12/20 2015   05/12/20 1845  metroNIDAZOLE (FLAGYL) IVPB 500 mg        500 mg 100 mL/hr over 60 Minutes Intravenous  Once 05/12/20 1832 05/12/20 2025   05/12/20 1845  vancomycin (VANCOCIN) IVPB 1000 mg/200 mL premix  Status:  Discontinued        1,000 mg 200 mL/hr over 60 Minutes Intravenous  Once 05/12/20 1832 05/12/20 1837   05/12/20 1845  vancomycin (VANCOREADY) IVPB 1500 mg/300 mL        1,500 mg 150 mL/hr over 120 Minutes Intravenous  Once 05/12/20 1838 05/12/20 2225      Medications: Scheduled Meds: . chlorhexidine  15 mL Mouth Rinse BID  . Chlorhexidine Gluconate Cloth  6 each Topical Daily  . collagenase   Topical Daily  . feeding supplement (PROSource TF)  45 mL Per Tube BID  . free water  200 mL Per Tube Q4H  . mouth rinse  15 mL Mouth Rinse q12n4p  . nutrition supplement (JUVEN)  1 packet Per Tube BID BM  . pantoprazole (PROTONIX) IV  40 mg Intravenous QHS  . potassium chloride  40  mEq Per Tube Once   Continuous Infusions: . sodium chloride 250 mL (05/14/20 1259)  . cefTRIAXone (ROCEPHIN)  IV 2 g (05/14/20 1300)  . dextrose 5 % and 0.45 % NaCl with KCl 20 mEq/L 75 mL/hr at 05/14/20 0959  . feeding supplement (VITAL 1.5 CAL)    . lactated ringers    . norepinephrine (LEVOPHED) Adult infusion 4 mcg/min (05/14/20 1330)   PRN Meds:.docusate sodium, ondansetron (ZOFRAN) IV, polyethylene glycol    Objective: Weight change:   Intake/Output Summary (Last 24 hours) at 05/14/2020 1503 Last data filed at 05/14/2020 0959 Gross per 24 hour  Intake 2234.96 ml  Output 1350 ml  Net 884.96 ml   Blood pressure 118/72, pulse 88, temperature 98 F (36.7 C), temperature source Axillary, resp. rate (!) 24, height 6\' 2"  (1.88 m), weight 64 kg, SpO2 98 %. Temp:  [97.34 F (36.3 C)-98.2 F (36.8 C)] 98 F (36.7 C) (05/06 1200) Pulse Rate:  [67-88] 88 (05/06 0800) Resp:  [15-31] 24 (05/06 0800) BP: (92-123)/(54-96) 118/72 (05/06 0800) SpO2:  [93 %-100 %]  98 % (05/06 0800)  Physical Exam: Physical Exam Constitutional:      Appearance: He is ill-appearing.  HENT:     Head: Normocephalic.  Cardiovascular:     Rate and Rhythm: Tachycardia present.     Heart sounds: No murmur heard. No friction rub. No gallop.   Pulmonary:     Effort: No respiratory distress.     Breath sounds: No stridor. No wheezing or rhonchi.  Abdominal:     General: There is no distension.     Palpations: There is mass.  Skin:    General: Skin is dry.     Coloration: Skin is pale.     Multiple decubitus ulcers are dressed  I did not try to manipulate his hip   CBC:    BMET Recent Labs    05/13/20 0611 05/14/20 0244  NA 152* 150*  K 3.2* 3.3*  CL 123* 121*  CO2 14* 17*  GLUCOSE 65* 157*  BUN 63* 67*  CREATININE 1.79* 1.78*  CALCIUM 7.6* 7.6*     Liver Panel  Recent Labs    05/12/20 1827 05/14/20 0244  PROT 6.9 4.7*  ALBUMIN 2.5* 1.7*  AST 101* 73*  ALT 66* 42  ALKPHOS  76 47  BILITOT 2.5* 2.0*       Sedimentation Rate No results for input(s): ESRSEDRATE in the last 72 hours. C-Reactive Protein No results for input(s): CRP in the last 72 hours.  Micro Results: Recent Results (from the past 720 hour(s))  Blood Culture (routine x 2)     Status: Abnormal (Preliminary result)   Collection Time: 05/12/20  6:45 PM   Specimen: BLOOD  Result Value Ref Range Status   Specimen Description   Final    BLOOD RIGHT WRIST Performed at [facility] [address], [place], [place] [address]    Special Requests   Final    BOTTLES DRAWN AEROBIC AND ANAEROBIC Blood Culture adequate volume   Culture  Setup Time   Final    GRAM POSITIVE COCCI IN BOTH AEROBIC AND ANAEROBIC BOTTLES CRITICAL RESULT CALLED TO, READ BACK BY AND VERIFIED WITH: PHARM D L.POINDEXTER ON DX:[id number] AT 0850 BY E.PARRISH    Culture (A)  Final    STREPTOCOCCUS GROUP G SUSCEPTIBILITIES TO FOLLOW Performed at [facility], [place] [address] [place], [place] [address]    Report Status PENDING  Incomplete  Blood Culture ID Panel (Reflexed)     Status: Abnormal   Collection Time: 05/12/20  6:45 PM  Result Value Ref Range Status   Enterococcus faecalis NOT DETECTED NOT DETECTED Final   Enterococcus Faecium NOT DETECTED NOT DETECTED Final   Listeria monocytogenes NOT DETECTED NOT DETECTED Final   Staphylococcus species NOT DETECTED NOT DETECTED Final   Staphylococcus aureus (BCID) NOT DETECTED NOT DETECTED Final   Staphylococcus epidermidis NOT DETECTED NOT DETECTED Final   Staphylococcus lugdunensis NOT DETECTED NOT DETECTED Final   Streptococcus species DETECTED (A) NOT DETECTED Final    Comment: Not Enterococcus species, Streptococcus agalactiae, Streptococcus pyogenes, or Streptococcus pneumoniae. CRITICAL RESULT CALLED TO, READ BACK BY AND VERIFIED WITH: PHARM D L.POINDEXTER ON DX:[id number] AT 0850 BY E.PARRISH    Streptococcus agalactiae NOT DETECTED NOT DETECTED  Final   Streptococcus pneumoniae NOT DETECTED NOT DETECTED Final   Streptococcus pyogenes NOT DETECTED NOT DETECTED Final   A.calcoaceticus-baumannii NOT DETECTED NOT DETECTED Final   Bacteroides fragilis NOT DETECTED NOT DETECTED Final   Enterobacterales NOT DETECTED NOT DETECTED Final  Enterobacter cloacae complex NOT DETECTED NOT DETECTED Final   Escherichia coli NOT DETECTED NOT DETECTED Final   Klebsiella aerogenes NOT DETECTED NOT DETECTED Final   Klebsiella oxytoca NOT DETECTED NOT DETECTED Final   Klebsiella pneumoniae NOT DETECTED NOT DETECTED Final   Proteus species NOT DETECTED NOT DETECTED Final   Salmonella species NOT DETECTED NOT DETECTED Final   Serratia marcescens NOT DETECTED NOT DETECTED Final   Haemophilus influenzae NOT DETECTED NOT DETECTED Final   Neisseria meningitidis NOT DETECTED NOT DETECTED Final   Pseudomonas aeruginosa NOT DETECTED NOT DETECTED Final   Stenotrophomonas maltophilia NOT DETECTED NOT DETECTED Final   Candida albicans NOT DETECTED NOT DETECTED Final   Candida auris NOT DETECTED NOT DETECTED Final   Candida glabrata NOT DETECTED NOT DETECTED Final   Candida krusei NOT DETECTED NOT DETECTED Final   Candida parapsilosis NOT DETECTED NOT DETECTED Final   Candida tropicalis NOT DETECTED NOT DETECTED Final   Cryptococcus neoformans/gattii NOT DETECTED NOT DETECTED Final    Comment: Performed at [facility], [place] [address] [place], [place] [address]  Blood Culture (routine x 2)     Status: Abnormal (Preliminary result)   Collection Time: 05/12/20  6:57 PM   Specimen: BLOOD  Result Value Ref Range Status   Specimen Description   Final    BLOOD LEFT ANTECUBITAL Performed at [facility] [address] [place], [place] [address]    Special Requests   Final    BOTTLES DRAWN AEROBIC AND ANAEROBIC Blood Culture adequate volume   Culture  Setup Time   Final    GRAM POSITIVE COCCI CRITICAL VALUE NOTED.  VALUE IS  CONSISTENT WITH PREVIOUSLY REPORTED AND CALLED VALUE. IN BOTH AEROBIC AND ANAEROBIC BOTTLES Performed at [facility], [place] [address], [place], [place] [address]    Culture STREPTOCOCCUS GROUP G (A)  Final   Report Status PENDING  Incomplete  Resp Panel by RT-PCR (Flu A&B, Covid) Nasopharyngeal Swab     Status: None   Collection Time: 05/12/20  7:00 PM   Specimen: Nasopharyngeal Swab; Nasopharyngeal(NP) swabs in vial transport medium  Result Value Ref Range Status   SARS Coronavirus 2 by RT PCR NEGATIVE NEGATIVE Final    Comment: (NOTE) SARS-CoV-2 target nucleic acids are NOT DETECTED.  The SARS-CoV-2 RNA is generally detectable in upper respiratory specimens during the acute phase of infection. The lowest concentration of SARS-CoV-2 viral copies this assay can detect is 138 copies/mL. A negative result does not preclude SARS-Cov-2 infection and should not be used as the sole basis for treatment or other patient management decisions. A negative result may occur with  improper specimen collection/handling, submission of specimen other than nasopharyngeal swab, presence of viral mutation(s) within the areas targeted by this assay, and inadequate number of viral copies(<138 copies/mL). A negative result must be combined with clinical observations, patient history, and epidemiological information. The expected result is Negative.  Fact Sheet for Patients:  EntrepreneurPulse.com.au  Fact Sheet for Healthcare Providers:  IncredibleEmployment.be  This test is no t yet approved or cleared by the [place] FDA and  has been authorized for detection and/or diagnosis of SARS-CoV-2 by FDA under an Emergency Use Authorization (EUA). This EUA will remain  in effect (meaning this test can be used) for the duration of the COVID-19 declaration under Section 564(b)(1) of the Act, 21 U.S.C.section 360bbb-3(b)(1), unless the authorization is terminated   or revoked sooner.       Influenza A by  PCR NEGATIVE NEGATIVE Final   Influenza B by PCR NEGATIVE NEGATIVE Final    Comment: (NOTE) The Xpert Xpress SARS-CoV-2/FLU/RSV plus assay is intended as an aid in the diagnosis of influenza from Nasopharyngeal swab specimens and should not be used as a sole basis for treatment. Nasal washings and aspirates are unacceptable for Xpert Xpress SARS-CoV-2/FLU/RSV testing.  Fact Sheet for Patients: EntrepreneurPulse.com.au  Fact Sheet for Healthcare Providers: IncredibleEmployment.be  This test is not yet approved or cleared by the [place] FDA and has been authorized for detection and/or diagnosis of SARS-CoV-2 by FDA under an Emergency Use Authorization (EUA). This EUA will remain in effect (meaning this test can be used) for the duration of the COVID-19 declaration under Section 564(b)(1) of the Act, 21 U.S.C. section 360bbb-3(b)(1), unless the authorization is terminated or revoked.  Performed at [facility], [place] [address], [place], [place] [address]   Urine culture     Status: Abnormal   Collection Time: 05/12/20  8:45 PM   Specimen: In/Out Cath Urine  Result Value Ref Range Status   Specimen Description   Final    IN/OUT CATH URINE Performed at [facility] [address] [place], [place] [address]    Special Requests   Final    NONE Performed at [facility], [place] [address] [place], [place] [address]    Culture MULTIPLE SPECIES PRESENT, SUGGEST RECOLLECTION (A)  Final   Report Status 05/14/2020 FINAL  Final  MRSA PCR Screening     Status: None   Collection Time: 05/13/20  5:28 AM   Specimen: Nasopharyngeal  Result Value Ref Range Status   MRSA by PCR NEGATIVE NEGATIVE Final    Comment:        The GeneXpert MRSA Assay (FDA approved for NASAL specimens only), is one component of a comprehensive MRSA  colonization surveillance program. It is not intended to diagnose MRSA infection nor to guide or monitor treatment for MRSA infections. Performed at [facility], [place] [address] [place], [place] [address]     Studies/Results: CT Abdomen Pelvis Wo Contrast  Result Date: 05/12/2020 CLINICAL DATA:  Sepsis, Unwitnessed fall, abdominal abscess EXAM: CT ABDOMEN AND PELVIS WITHOUT CONTRAST TECHNIQUE: Multidetector CT imaging of the abdomen and pelvis was performed following the standard protocol without IV contrast. COMPARISON:  07/31/2013 FINDINGS: Lower chest: Mild bibasilar atelectasis. The visualized heart and pericardium are unremarkable. Hepatobiliary: No focal liver abnormality is seen. No gallstones, gallbladder wall thickening, or biliary dilatation. Pancreas: Unremarkable Spleen: Unremarkable Adrenals/Urinary Tract: The adrenal glands are unremarkable. The kidneys are normal. The bladder is partially obscured by streak artifact from right total hip arthroplasty. Foley catheter balloon is seen within the bladder lumen. The visualized bladder is unremarkable. Stomach/Bowel: Stomach is within normal limits. Appendix appears normal. No evidence of bowel wall thickening, distention, or inflammatory changes. No free intraperitoneal gas or fluid. Vascular/Lymphatic: Mild atherosclerotic calcification within the abdominal aorta. No aortic aneurysm. No pathologic adenopathy within the abdomen and pelvis. Reproductive: The prostate gland is partially obscured by streak artifact but is otherwise unremarkable. Seminal vesicles are unremarkable. Other: There is a soft tissue wound in the region of the intergluteal cleft just subjacent to the coccyx. This appears slightly enlarged since prior examination. There is no associated discrete drainable fluid collection identified. 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 No acute bone abnormality. IMPRESSION: No acute  intra-abdominal pathology identified. No definite radiographic explanation for the patient's reported symptoms. Enlarging  superficial soft tissue wound within the intergluteal cleft immediately subjacent to the coccyx. No extension into the perirectal or presacral space. No associated drainable fluid collection. Aortic Atherosclerosis (ICD10-I70.0). Electronically Signed   By: Fidela Salisbury MD   On: 05/12/2020 22:33   CT Head Wo Contrast  Result Date: 05/13/2020 CLINICAL DATA:  Intracranial hemorrhage follow-up, 2 mm posterior fall seen subdural hematoma on comparison exam. EXAM: CT HEAD WITHOUT CONTRAST TECHNIQUE: Contiguous axial images were obtained from the base of the skull through the vertex without intravenous contrast. COMPARISON:  CT 05/12/2020 FINDINGS: Brain: Stable appearance of minimal hyperdense thickening along the posterior falx compatible with a thin subdural hematoma measuring up to 2 mm in maximal thickness and not significantly changed from comparison exam. No new sites of hyperdense hemorrhage are seen. No evidence of acute infarction, hemorrhage, hydrocephalus, extra-axial collection, visible mass lesion or mass effect. Symmetric prominence of the ventricles, cisterns and sulci compatible with parenchymal volume loss. Patchy areas of white matter hypoattenuation are most compatible with chronic microvascular angiopathy. Vascular: No hyperdense vessel or unexpected calcification. Skull: Question some mild high midline parietal scalp thickening. No subjacent calvarial fracture. No other significant scalp swelling. Sinuses/Orbits: Minimal mural thickening in the paranasal sinuses without layering air-fluid levels or pneumatized secretions. Mastoid air cells are clear. Other: Debris in the external auditory canals. Age-indeterminate deformity of the left nasal bone. IMPRESSION: 1. Stable appearance of some trace posterior para falcine subdural hematoma measure up to 2 mm in maximal thickness.  No new sites of hemorrhage. 2. No other acute intracranial abnormality, specifically no new sites of hemorrhage or evidence of infarct. 3. Background of chronic microvascular angiopathy and parenchymal volume loss. 4. Question some mild high midline parietal scalp swelling, correlate for point tenderness. 5. Age-indeterminate deformity of the left nasal bone. 6. Debris in the bilateral external auditory canals, correlate for cerumen impaction. Electronically Signed   By: Lovena Le M.D.   On: 05/13/2020 01:23   CT Head Wo Contrast  Result Date: 05/12/2020 CLINICAL DATA:  Found down, sepsis EXAM: CT HEAD WITHOUT CONTRAST TECHNIQUE: Contiguous axial images were obtained from the base of the skull through the vertex without intravenous contrast. COMPARISON:  None. FINDINGS: Brain: There is a small subdural hematoma along the posterior falx, measuring up to 2 mm in thickness. No mass effect. No acute infarct. Lateral ventricles and remaining midline structures are unremarkable. Vascular: No hyperdense vessel or unexpected calcification. Skull: Normal. Negative for fracture or focal lesion. Sinuses/Orbits: No acute finding. Other: None. IMPRESSION: 1. 2 mm posterior falcine subdural hematoma.  No mass effect. 2. No acute infarct. Critical Value/emergent results were called by telephone at the time of interpretation on 05/12/2020 at 10:34 pm to provider DR [facility], who verbally acknowledged these results. Electronically Signed   By: Randa Ngo M.D.   On: 05/12/2020 22:34   CT Cervical Spine Wo Contrast  Result Date: 05/12/2020 CLINICAL DATA:  Found down EXAM: CT CERVICAL SPINE WITHOUT CONTRAST TECHNIQUE: Multidetector CT imaging of the cervical spine was performed without intravenous contrast. Multiplanar CT image reconstructions were also generated. COMPARISON:  None. FINDINGS: Alignment: Alignment is grossly anatomic. Skull base and vertebrae: No acute fracture. No primary bone lesion or focal pathologic  process. Soft tissues and spinal canal: No prevertebral fluid or swelling. No visible canal hematoma. Disc levels: Prominent spondylosis at C5-6 and C6-7. Mild diffuse facet hypertrophy. Upper chest: Airway is patent. Emphysematous changes are noted at the lung apices. Other: Reconstructed images demonstrate no additional  findings. IMPRESSION: 1. No acute cervical spine fracture. Multilevel cervical degenerative change. Electronically Signed   By: Randa Ngo M.D.   On: 05/12/2020 22:33   MR BRAIN W WO CONTRAST  Result Date: 05/14/2020 CLINICAL DATA:  Initial evaluation for acute mental status change, unknown cause. EXAM: MRI HEAD WITHOUT AND WITH CONTRAST TECHNIQUE: Multiplanar, multiecho pulse sequences of the brain and surrounding structures were obtained without and with intravenous contrast. CONTRAST:  6mL GADAVIST GADOBUTROL 1 MMOL/ML IV SOLN COMPARISON:  Prior head CT from earlier the same day. FINDINGS: Brain: Examination mildly degraded by motion artifact. Diffuse prominence of the CSF containing spaces compatible generalized age-related cerebral atrophy. Patchy and confluent T2/FLAIR hyperintensity within the periventricular deep white matter both cerebral hemispheres most consistent with chronic small vessel ischemic disease, mild in nature. Fairly extensive multifocal foci of diffusion abnormality are seen involving the cortical and subcortical aspect of both cerebral hemispheres. Additional patchy involvement noted within the cerebellar hemispheres bilaterally as well. Patchy involvement of the right basal ganglia and left thalamus. Findings consistent with multifocal acute to subacute ischemic infarcts. For reference purposes, the largest focus within the left cerebral hemisphere is seen involving the white matter of the left frontal centrum semi ovale and measures 1 cm (series 5, image 85). Largest area involvement at the right cerebral hemisphere involves the cortical and subcortical aspect of  the anterior right frontal lobe r, measuring approximately up to 2.9 cm (series 5, image 85). Associated T2/FLAIR signal abnormality with patchy and serpiginous postcontrast enhancement seen about several of these foci, consistent with subacute ischemic changes. Additionally, probable associated petechial hemorrhage about several foci without frank hemorrhagic transformation. No significant regional mass effect about these areas of ischemia. A central thromboembolic etiology is likely given the various vascular distributions involved. Multiple additional scattered chronic micro hemorrhages seen involving the bilateral cerebral and cerebellar hemispheres, nonspecific, and could be related to cerebral amyloid angiopathy and/or hypertension. Small subdural hemorrhage seen along the left posterior falx again noted, measuring up to 3 mm in maximal thickness. Slight extension along the left tentorium. No associated mass effect. No other acute intracranial hemorrhage or extra-axial collection. No mass lesion or midline shift. Ventricles normal size without hydrocephalus. No other abnormal enhancement. Vascular: Major intracranial vascular flow voids are maintained. Skull and upper cervical spine: Craniocervical junction within normal limits. Bone marrow signal intensity normal. No focal marrow replacing lesion. Left parieto-occipital scalp contusion noted. Sinuses/Orbits: Globes and orbital soft tissues demonstrate no acute finding. Mild scattered mucosal thickening noted throughout the paranasal sinuses. Trace bilateral mastoid effusions, of doubtful significance. Inner ear structures grossly normal. Other: None. IMPRESSION: 1. Patchy multifocal acute to subacute ischemic infarcts involving the bilateral cerebral and cerebellar hemispheres as above. Associated mild scattered petechial hemorrhage without frank hemorrhagic transformation or significant mass effect. A central thromboembolic etiology is likely given the  various vascular distributions involved. 2. Small 3 mm subdural hemorrhage along the left posterior falx without associated mass effect. 3. Multiple additional scattered chronic micro hemorrhages involving the bilateral cerebral and cerebellar hemispheres, nonspecific, but could be related to cerebral amyloid angiopathy and/or hypertension. 4. Underlying mild age-related cerebral atrophy with chronic small vessel ischemic disease. Electronically Signed   By: Jeannine Boga M.D.   On: 05/14/2020 00:50   MR HIP RIGHT W WO CONTRAST  Result Date: 05/13/2020 CLINICAL DATA:  Right hip arthroplasty, possible infection EXAM: MRI OF THE RIGHT HIP WITHOUT AND WITH CONTRAST TECHNIQUE: Multiplanar, multisequence MR imaging was performed both before and after  administration of intravenous contrast. CONTRAST:  39mL GADAVIST GADOBUTROL 1 MMOL/ML IV SOLN COMPARISON:  05/12/2020 FINDINGS: There is a right hip arthroplasty in place. A large fluid collection surrounds the right hip prosthesis and proximal femur, measuring approximately 9.7 x 5.4 by 7.7 cm in size. The fluid collection is approximately 1.2 cm deep to the skin surface along its lateral aspect. The fluid collection demonstrates mild peripheral enhancement on postcontrast imaging. Fluid extends along the femoral component of the right hip arthroplasty within the medullary cavity of the proximal right femur. Infection cannot be excluded. If further evaluation is desired, aspiration and culture may be indicated. Remaining bony structures demonstrate normal signal characteristics. Mild dependent edema is seen within the proximal thighs. There is a small soft tissue defect along the right aspect of the gluteal cleft. This extends toward the coccyx, without associated fluid collection or abscess. No involvement of the distal rectum or anal verge. There is a small amount of free fluid within the pelvis. Visualized portions of the bowel are unremarkable. Bladder is  moderately distended without filling defect. Small amount of gas is seen within the bladder lumen, likely related to recent catheter placement. Bilateral hydroceles are identified, left larger than right. IMPRESSION: 1. Large fluid collection surrounding the right hip arthroplasty, with minimal peripheral enhancement on postcontrast imaging. The fluid extends along the femoral component within the medullary cavity of the proximal right femur. Underlying infection is not excluded. Aspiration and culture may be useful for confirmation. 2. Small soft tissue defect along the right aspect of the gluteal cleft, with no underlying fluid collection or abscess. No change since recent CT. 3. Trace free fluid within the pelvis. 4. Bilateral hydroceles. Electronically Signed   By: Randa Ngo M.D.   On: 05/13/2020 22:55   DG Chest Port 1 View  Result Date: 05/12/2020 CLINICAL DATA:  Found on floor EXAM: PORTABLE CHEST 1 VIEW COMPARISON:  04/14/2015 FINDINGS: Apical blebs. No focal opacity or pleural effusion. Normal cardiomediastinal silhouette. No pneumothorax IMPRESSION: No active disease. Electronically Signed   By: Donavan Foil M.D.   On: 05/12/2020 19:41   ECHOCARDIOGRAM COMPLETE  Result Date: 05/13/2020    ECHOCARDIOGRAM REPORT   Patient Name:   BLAZE WOLOSZYK Date of Exam: 05/13/2020 Medical Rec #:  [id number]      Height:       74.0 in Accession #:    [id number]     Weight:       141.1 lb Date of Birth:  1960/06/20     BSA:          1.874 m Patient Age:    60 years       BP:           73/53 mmHg Patient Gender: M              HR:           88 bpm. Exam Location:  Inpatient Procedure: 2D Echo, Cardiac Doppler and Color Doppler Indications:    CHF  History:        Patient has prior history of Echocardiogram examinations, most                 recent 08/02/2013.  Sonographer:    Bernadene Person RDCS Referring Phys: [id number] [name] Pain  Sonographer Comments: Image acquisition challenging due to uncooperative  patient. IMPRESSIONS  1. Left ventricular ejection fraction, by estimation, is 55 to 60%. The left ventricle has normal function. The left ventricle has  no regional wall motion abnormalities. Left ventricular diastolic parameters are consistent with Grade I diastolic dysfunction (impaired relaxation).  2. Right ventricular systolic function is normal. The right ventricular size is normal. There is normal pulmonary artery systolic pressure.  3. The mitral valve is grossly normal. No evidence of mitral valve regurgitation.  4. The aortic valve was not well visualized. Aortic valve regurgitation is not visualized. No aortic stenosis is present.  5. Aortic dilatation noted. There is mild dilatation of the aortic root, measuring 40 mm. Comparison(s): A prior study was performed on 08/02/2013. Prior images reviewed side by side. 2015 study superior quality, compared to this aortic root size is increased. FINDINGS  Left Ventricle: Left ventricular ejection fraction, by estimation, is 55 to 60%. The left ventricle has normal function. The left ventricle has no regional wall motion abnormalities. The left ventricular internal cavity size was normal in size. There is  no left ventricular hypertrophy. Left ventricular diastolic parameters are consistent with Grade I diastolic dysfunction (impaired relaxation). Right Ventricle: The right ventricular size is normal. No increase in right ventricular wall thickness. Right ventricular systolic function is normal. There is normal pulmonary artery systolic pressure. The tricuspid regurgitant velocity is 2.11 m/s, and  with an assumed right atrial pressure of 3 mmHg, the estimated right ventricular systolic pressure is [id number] mmHg. Left Atrium: Left atrial size was normal in size. Right Atrium: Right atrial size was normal in size. Pericardium: There is no evidence of pericardial effusion. Mitral Valve: The mitral valve is grossly normal. No evidence of mitral valve regurgitation.  Tricuspid Valve: The tricuspid valve is grossly normal. Tricuspid valve regurgitation is not demonstrated. Aortic Valve: The aortic valve was not well visualized. Aortic valve regurgitation is not visualized. No aortic stenosis is present. Pulmonic Valve: The pulmonic valve was not well visualized. Pulmonic valve regurgitation is not visualized. No evidence of pulmonic stenosis. Aorta: Aortic dilatation noted. There is mild dilatation of the aortic root, measuring 40 mm. IAS/Shunts: The atrial septum is grossly normal.  LEFT VENTRICLE PLAX 2D LVIDd:         3.80 cm  Diastology LVIDs:         3.20 cm  LV e' medial:    5.01 cm/s LV PW:         0.70 cm  LV E/e' medial:  11.7 LV IVS:        0.90 cm  LV e' lateral:   6.12 cm/s LVOT diam:     2.10 cm  LV E/e' lateral: 9.6 LV SV:         45 LV SV Index:   24 LVOT Area:     3.46 cm  RIGHT VENTRICLE RV S prime:     11.70 cm/s TAPSE (M-mode): 1.6 cm LEFT ATRIUM             Index       RIGHT ATRIUM          Index LA diam:        1.90 cm 1.01 cm/m  RA Area:     9.71 cm LA Vol (A2C):   24.9 ml 13.29 ml/m RA Volume:   20.80 ml 11.10 ml/m LA Vol (A4C):   20.2 ml 10.78 ml/m LA Biplane Vol: 22.5 ml 12.01 ml/m  AORTIC VALVE LVOT Vmax:   76.50 cm/s LVOT Vmean:  48.300 cm/s LVOT VTI:    0.131 m  AORTA Ao Root diam: 4.00 cm MITRAL VALVE  TRICUSPID VALVE MV Area (PHT): 3.30 cm    TR Peak grad:   17.8 mmHg MV Decel Time: 230 msec    TR Vmax:        211.00 cm/s MV E velocity: 58.70 cm/s MV A velocity: 62.10 cm/s  SHUNTS MV E/A ratio:  0.95        Systemic VTI:  0.13 m                            Systemic Diam: 2.10 cm Rudean Haskell MD Electronically signed by Rudean Haskell MD Signature Date/Time: 05/13/2020/5:16:09 PM    Final       Assessment/Plan:  INTERVAL HISTORY:   Right hip with a large fluid collection surrounding the right hip arthroplasty extending into the femoral component within the medullary cavity the right femur and an MRI.  MRI of  the brain also shows multiple acute and subacute infarcts in bilateral cerebral and cerebellar hemispheres   Principal Problem:   Septic shock ([place]) Active Problems:   Human immunodeficiency virus (HIV) disease ([place])   AKI (acute kidney injury) ([place])   Lactic acidosis   SDH (subdural hematoma) ([facility])   Hypothermia due to exposure   Cellulitis   Elevated brain natriuretic peptide (BNP) level   Volume overload   Dehydration   Pressure injury of skin   Subdural empyema   Streptococcal bacteremia   Malnutrition of moderate degree    [name] [name] is a 60 y.o. 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  Multiple decubitus ulcers documented by wound care he had a subdural hematoma found as well.  He is now growing group G Streptococcus from blood cultures.   Group G streptococcal bacteremia with concern for  septic prosthetic joint, possible endocarditis with septic emboli to brain (though findings read as ischemic) TTE negative  -- Continue high-dose ceftriaxone -- Agree with aspirate of hip and would send for cell count and differential and if possible also culture but priority to the cell count and differential  Suspect he is going to need resection arthroplasty   HIV and AIDS:  CD4 was <35 with % 7 on 5/2 (but I repeated it apparently and now it is 107, %9  VL just came back and is nearly 900K  He now has an feeding tube.  I will check a serum cryptococcal antigen out of thoroughness, and if it is negative we will reinitiate antiretroviral therapy likely with crushed  Prezcobix and TIVICAY down his feeding tube   Infarcts in CNS: will check crypto ag and RPR as well> I suspect he may have endocarditis with septic emboli   Alcoholism: has relapses significantly potentially due to worsening of his pain from his rectal pathology and sites where he was biopsied  Multiple decubitus ulcers: Wound care following  I spent greater than 9minutes with the patient including greater than 50% of time in personally reviewing radiographic films, labs, data and  in coordination of his care with Critical Care team.   Dr Baxter Flattery will check in on him over the weekend and Dr Linus Salmons  will take over the service on Monday.   LOS: 1 day   [name] [name] 05/14/2020, 3:03 PM

## 2020-05-14 NOTE — Progress Notes (Signed)
Initial Nutrition Assessment  DOCUMENTATION CODES:   Non-severe (moderate) malnutrition in context of chronic illness,Underweight  INTERVENTION:  - will order TF regimen: Vital 1.5 @ 20 ml/hr to advance by 10 ml every 8 hours to reach goal rate of 60 ml/hr with 45 ml Prosource TF BID and 1 packet Juven BID.  - at goal rate, this regimen will provide 2430 kcal, 124 grams protein, and 1100 ml free water.  - free water flush to continue to be per CCM.  Monitor magnesium, potassium, and phosphorus daily for at least 3 days, MD to replete as needed, as pt is at risk for refeeding syndrome given moderate malnutrition, current hypokalemia, unknown PO intakes PTA.    NUTRITION DIAGNOSIS:   Moderate Malnutrition related to chronic illness (HIV/AIDS, hx of rectal cancer) as evidenced by moderate fat depletion,moderate muscle depletion,severe muscle depletion.  GOAL:   Patient will meet greater than or equal to 90% of their needs  MONITOR:   Diet advancement,TF tolerance,Labs,Weight trends,Skin  REASON FOR ASSESSMENT:   Consult Enteral/tube feeding initiation and management  ASSESSMENT:   60 y.o. male with medical history of HIV/AIDS, arthritis, gout, hepatitis B, anemia, and squamous cell rectal cancer s/p radiation. He presented to the ED via EMS after being found down with unknown amount of time down. He had been seen 2 days PTA. CT head in the ED showed small subdural hematoma. Neurosurgery saw him in the ED and signed off.  Patient laying in bed with no family or visitors present. He is noted to be disoriented x4. Patient sleeping when RD entered the room but opened eyes to name call x1 and shoulder rub. He mumbled but unable to have a conversation.   Small bore NGT placed a short time ago and has not yet been added to the flow sheet. He had been on Regular diet from yesterday at 0356 but has since been made NPO with NGT placement. No intakes documented.  Weight yesterday was 141 lb  and weight has been stable since 02/20/20 when he weighed 143 lb.   Order in place for 200 ml free water every 4 hours (1200 ml/24 hrs).  Per notes: - septic shock d/t strep bacteremia with concern for R prosthetic hip infection - fall with head trauma leading to SDH - acute metabolic encephalopathy - AKI - HIV/AIDS--family reported he is very compliant with medications - hx of anal cancer with recent biopsy negative for malignancy    Labs reviewed; Na: 150 mmol/l, K: 3.3 mmol/l, Cl: 121 mmol/l, BUN: 67 mg/dl, creatinine: 1.78 mg/dl, Ca: 7.6 mg/dl, GFR: 44 ml/min.  Medications reviewed; 40 mg IV protonix/day, 40 mEq Klor-Con x1 dose 5/6, 15 mmol IV KPhos x1 run 5/5. IVF; D5-1/2 NS-20 mEq IV KCl @ 75 ml/hr (306 kcal/24 hrs).    NUTRITION - FOCUSED PHYSICAL EXAM:  Flowsheet Row Most Recent Value  Orbital Region Moderate depletion  Upper Arm Region Moderate depletion  Thoracic and Lumbar Region Unable to assess  Buccal Region Moderate depletion  Temple Region Mild depletion  Clavicle Bone Region Severe depletion  Clavicle and Acromion Bone Region Severe depletion  Scapular Bone Region Unable to assess  Dorsal Hand Unable to assess  [mittens]  Patellar Region Severe depletion  Anterior Thigh Region Severe depletion  Posterior Calf Region Severe depletion  Edema (RD Assessment) None  Hair Reviewed  Eyes Reviewed  Mouth Unable to assess  Skin Reviewed  Nails Unable to assess       Diet Order:  Diet Order    None      EDUCATION NEEDS:   Not appropriate for education at this time  Skin:  Skin Assessment: Skin Integrity Issues: Skin Integrity Issues:: DTI,Stage I,Stage II,Unstageable,Other (Comment) DTI: upper back; R shoulder Stage I: bilateral heels; L elbow Stage II: R elbow Unstageable: full thickness to sacrum Other: full thickness maceration to rectum  Last BM:  5/5  Height:   Ht Readings from Last 1 Encounters:  05/12/20 6\' 2"  (1.88 m)    Weight:    Wt Readings from Last 1 Encounters:  05/13/20 64 kg     Estimated Nutritional Needs:  Kcal:  2350-2550 kcal Protein:  120-135 grams Fluid:  >/= 2.5 L/day     Jarome Matin, MS, RD, LDN, CNSC Inpatient Clinical Dietitian RD pager # available in [facility]  After hours/weekend pager # available in [facility]

## 2020-05-15 DIAGNOSIS — S065X9A Traumatic subdural hemorrhage with loss of consciousness of unspecified duration, initial encounter: Secondary | ICD-10-CM

## 2020-05-15 DIAGNOSIS — B20 Human immunodeficiency virus [HIV] disease: Secondary | ICD-10-CM

## 2020-05-15 DIAGNOSIS — A419 Sepsis, unspecified organism: Secondary | ICD-10-CM | POA: Diagnosis not present

## 2020-05-15 DIAGNOSIS — E860 Dehydration: Secondary | ICD-10-CM | POA: Diagnosis not present

## 2020-05-15 LAB — CBC WITH DIFFERENTIAL/PLATELET
Band Neutrophils: 2 %
Basophils Relative: 0 %
Blasts: NONE SEEN %
Eosinophils Relative: 0 %
HCT: 28.5 % — ABNORMAL LOW (ref 39.0–52.0)
Hemoglobin: 9.8 g/dL — ABNORMAL LOW (ref 13.0–17.0)
Lymphocytes Relative: 2 %
MCH: 36 pg — ABNORMAL HIGH (ref 26.0–34.0)
MCHC: 34.4 g/dL (ref 30.0–36.0)
MCV: 104.8 fL — ABNORMAL HIGH (ref 80.0–100.0)
Metamyelocytes Relative: NONE SEEN %
Monocytes Relative: 5 %
Myelocytes: 1 %
Neutrophils Relative %: 90 %
Platelets: 31 10*3/uL — ABNORMAL LOW (ref 150–400)
Promyelocytes Relative: NONE SEEN %
RBC Morphology: NORMAL
RBC: 2.72 MIL/uL — ABNORMAL LOW (ref 4.22–5.81)
RDW: 16.7 % — ABNORMAL HIGH (ref 11.5–15.5)
WBC Morphology: NORMAL
WBC: 11.2 10*3/uL — ABNORMAL HIGH (ref 4.0–10.5)
nRBC: 0.3 % — ABNORMAL HIGH (ref 0.0–0.2)
nRBC: 1 /100 WBC — ABNORMAL HIGH

## 2020-05-15 LAB — COMPREHENSIVE METABOLIC PANEL
ALT: 32 U/L (ref 0–44)
AST: 46 U/L — ABNORMAL HIGH (ref 15–41)
Albumin: 1.6 g/dL — ABNORMAL LOW (ref 3.5–5.0)
Alkaline Phosphatase: 60 U/L (ref 38–126)
Anion gap: 5 (ref 5–15)
BUN: 53 mg/dL — ABNORMAL HIGH (ref 6–20)
CO2: 19 mmol/L — ABNORMAL LOW (ref 22–32)
Calcium: 8 mg/dL — ABNORMAL LOW (ref 8.9–10.3)
Chloride: 125 mmol/L — ABNORMAL HIGH (ref 98–111)
Creatinine, Ser: 1.42 mg/dL — ABNORMAL HIGH (ref 0.61–1.24)
GFR, Estimated: 57 mL/min — ABNORMAL LOW (ref >60)
Glucose, Bld: 141 mg/dL — ABNORMAL HIGH (ref 70–99)
Potassium: 3.5 mmol/L (ref 3.5–5.1)
Sodium: 149 mmol/L — ABNORMAL HIGH (ref 135–145)
Total Bilirubin: 1.6 mg/dL — ABNORMAL HIGH (ref 0.3–1.2)
Total Protein: 4.7 g/dL — ABNORMAL LOW (ref 6.5–8.1)

## 2020-05-15 LAB — GLUCOSE, CAPILLARY
Glucose-Capillary: 102 mg/dL — ABNORMAL HIGH (ref 70–99)
Glucose-Capillary: 102 mg/dL — ABNORMAL HIGH (ref 70–99)
Glucose-Capillary: 107 mg/dL — ABNORMAL HIGH (ref 70–99)
Glucose-Capillary: 126 mg/dL — ABNORMAL HIGH (ref 70–99)
Glucose-Capillary: 126 mg/dL — ABNORMAL HIGH (ref 70–99)
Glucose-Capillary: 127 mg/dL — ABNORMAL HIGH (ref 70–99)
Glucose-Capillary: 139 mg/dL — ABNORMAL HIGH (ref 70–99)

## 2020-05-15 LAB — RPR: RPR Ser Ql: NONREACTIVE

## 2020-05-15 LAB — MAGNESIUM
Magnesium: 1.9 mg/dL (ref 1.7–2.4)
Magnesium: 1.7 mg/dL (ref 1.7–2.4)

## 2020-05-15 LAB — LACTIC ACID, PLASMA: Lactic Acid, Venous: 1.7 mmol/L (ref 0.5–1.9)

## 2020-05-15 LAB — PHOSPHORUS
Phosphorus: 1.3 mg/dL — ABNORMAL LOW (ref 2.5–4.6)
Phosphorus: 3.2 mg/dL (ref 2.5–4.6)

## 2020-05-15 LAB — PROCALCITONIN: Procalcitonin: 4 ng/mL

## 2020-05-15 MED ORDER — DEXTROSE-NACL 5-0.2 % IV SOLN: INTRAVENOUS | Status: DC

## 2020-05-15 MED ORDER — POTASSIUM PHOSPHATES 15 MMOLE/5ML IV SOLN
30.0000 mmol | Freq: Once | INTRAVENOUS | Status: AC
  Administered 2020-05-15: 30 mmol via INTRAVENOUS
  Filled 2020-05-15: qty 10

## 2020-05-15 NOTE — Progress Notes (Signed)
NAME:  [name] [name], MRN:  [id number], DOB:  October 31, 1960, LOS: 2 ADMISSION DATE:  05/12/2020, CONSULTATION DATE:  5/4 REFERRING MD:  [place], CHIEF COMPLAINT:  Found down   History of Present Illness:  60 y/o male admitted from the [facility] ER on 5/4 after being found down.  Noted to have sepsis physiology and a subdural hemorrhage.  NSGY reported to the ER that there was no indication for an intervention.    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 CT ABD/Pelvis > no acute intra-abdominal process, evidence of sacral decubitus, slight increase compared to prior but no underlying fluid collection, left hip prosthesis   5/5 CT Head > posterior parafalcine SDH.  Cefepime, vanco, flagyl. Remains confused, GP bacteremia on BCID. Family reported 1 wk hx of GI c/o's, fell landing on right hip, concerned he has been drinking more & difficulties with medication identification.  MRI R Hip > large fluid collection surrounding right hip arthroplasty with minimal peripheral enhancement, fluid extends along the femoral component within the medullary cavity of the proximal femur. Small soft tissue defect along the right aspect of the gluteal cleft, bilateral hydroceles. MRI Brain with multifocal acute to subacute ischemic infarcts involving the bilateral cerebral and cerebellar hemispheres, associated mild scattered petechial hemorrhage - possible central thromboembolic etiology. Small subdural hemorrhage without shift. Chronic small vessel disease. Limited TTE b/c of patient cooperation, but no evidence of vegetation, GI DD. ABX changed to ceftriaxone.   Interim History / Subjective:   no change mental status,  still on Levophed   Objective   Blood pressure 98/63, pulse 80, temperature 99.1 F (37.3 C), temperature source Axillary, resp. rate (!) 22, height 6\' 2"  (1.88 m), weight 67.1 kg, SpO2 96 %.        Intake/Output Summary (Last 24 hours) at 05/15/2020 1010 Last data filed at 05/15/2020 8527 Gross per 24 hour  Intake 2706.24 ml  Output 975 ml  Net 1731.24 ml   Filed Weights   05/12/20 1821 05/13/20 0500 05/15/20 0500  Weight: 64 kg 64 kg 67.1 kg    Examination: Tmax  99.6  sats on RA = 96%  General appearance:  Very chronically ill appearing   No jvd Oropharynx clear,  mucosa nl Neck supple Lungs with a few scattered exp > insp rhonchi bilaterally RRR no s3 or or sign murmur Abd soft with nl  excursion  Extr warm with no edema or clubbing noted Neuro knows name but answers every other question "yeah"     Labs/imaging that I havepersonally reviewed  (right click and "Reselect all SmartList Selections" daily)     Resolved Hospital Problem list     Assessment & Plan:   Septic Shock due to Strep Bacteremia  Concern for right prosthetic hip infection given recent injury (rolled out of bed onto hip) and pain with history of infection (strep s/p washouts) there in the past.  DDx includes skin/soft tissue source, endocarditis (but not seen on TTE), & AIDS related opportunistic infections.  History of B strep septic hip/psoas abscess.  Current MRI of right hip with large fluid collection. Has also had sacral decubitus for at least 8 years with healing mixed with opening of wound.  -  Asp R Hip per IR 5/6  Wbc's, no org seen  rec >>> - Levophed for MAP >65, wean as tol, continue IV fluids  - abx rx per ID  - likely will need TEE at some point   Fall with head trauma causing SDH Stable on head imaging, by report NSGY says no intervention needed after discussion with ER -MRI as above -follow neuro exam  -correct Na+, electrolyte abnormalities   Thrombocytopenia Lab Results   Component Value Date   PLT 31 (L) 05/14/2020   PLT 35 (L) 05/14/2020   PLT 47 (L) 05/13/2020   PLT 158 06/19/2008   PLT 197 12/19/2007   PLT 149 09/20/2007   S/p platelets 5/5  >> repeat transfusion 5/7 due to risk of CNS bleeding    Acute Metabolic Encephalopathy Mixed picture, metabolic cause from sepsis but confounded by head injury; doesn't appear to be in alcohol withdrawal but at risk for that; some improvement with fluids, antibiotics but he is far from his baseline (works, drives etc) -minimize all sedating medications  -MRI as above, concern for embolic phenomena and structural/ permanent deficits -PT/OT efforts when able   Sacral wound, chronic, present on admission Present for at least 8 years -WOC consulted  -pressure relieving measures   Hypernatremia AKI Hypokalemia Hypophosphatemis -Trend BMP / urinary output -Avoid nephrotoxic agents, ensure adequate renal perfusion >>> Kphos today, changed fluids to d5 1/4 NS at 100 cc per hour  AIDS  Per family, he is very compliant with medications? Lymphopenia noted on CBC. CD4 <35 -appreciate ID, hold home antivirals for now. Defer to ID.   At Risk Malnutrition  -place small bore feeding tube -begin nutrition, free water PT  History of anal cancer, recent biopsy by Dr. Dema Severin negative for malignancy -supportive care   Skin changes feet > Kaposi's?  -per ID    Best practice (right click and "Reselect all SmartList Selections" daily)  Diet:  NPO Pain/Anxiety/Delirium protocol (if indicated): No VAP protocol (if indicated): Not indicated DVT prophylaxis: SCD GI prophylaxis: N/A Glucose control:  SSI No Central venous access:  N/A Arterial line:  N/A Foley:  N/A Mobility:  bed rest  PT consulted: N/A Last date of multidisciplinary goals of care discussion 5/6 -follow up conversation with sister [name] [name], they have not discussed code status as of yet.  Reviewed reversible / non-reversible illnesses. Discussed  his overall state of health and current acute processes.   Code Status:  full code Disposition: ICU   The patient is critically ill with multiple organ systems failure and requires high complexity decision making for assessment and support, frequent evaluation and titration of therapies, application of advanced monitoring technologies and extensive interpretation of multiple databases. Critical Care Time devoted to patient care services described in this note is 35 minutes.    Christinia Gully, MD Pulmonary and [place] [phone]   After 7:00 pm call Elink  [phone]

## 2020-05-15 NOTE — Progress Notes (Deleted)
duplicate

## 2020-05-16 DIAGNOSIS — N179 Acute kidney failure, unspecified: Secondary | ICD-10-CM | POA: Diagnosis not present

## 2020-05-16 DIAGNOSIS — A419 Sepsis, unspecified organism: Secondary | ICD-10-CM | POA: Diagnosis not present

## 2020-05-16 DIAGNOSIS — B955 Unspecified streptococcus as the cause of diseases classified elsewhere: Secondary | ICD-10-CM

## 2020-05-16 DIAGNOSIS — B20 Human immunodeficiency virus [HIV] disease: Secondary | ICD-10-CM | POA: Diagnosis not present

## 2020-05-16 DIAGNOSIS — L0390 Cellulitis, unspecified: Secondary | ICD-10-CM

## 2020-05-16 DIAGNOSIS — R6521 Severe sepsis with septic shock: Secondary | ICD-10-CM | POA: Diagnosis not present

## 2020-05-16 DIAGNOSIS — R7881 Bacteremia: Secondary | ICD-10-CM

## 2020-05-16 DIAGNOSIS — S065X9A Traumatic subdural hemorrhage with loss of consciousness of unspecified duration, initial encounter: Secondary | ICD-10-CM | POA: Diagnosis not present

## 2020-05-16 LAB — CBC
HCT: 26.4 % — ABNORMAL LOW (ref 39.0–52.0)
Hemoglobin: 8.6 g/dL — ABNORMAL LOW (ref 13.0–17.0)
MCH: 36.3 pg — ABNORMAL HIGH (ref 26.0–34.0)
MCHC: 32.6 g/dL (ref 30.0–36.0)
MCV: 111.4 fL — ABNORMAL HIGH (ref 80.0–100.0)
Platelets: 48 10*3/uL — ABNORMAL LOW (ref 150–400)
RBC: 2.37 MIL/uL — ABNORMAL LOW (ref 4.22–5.81)
RDW: 17.6 % — ABNORMAL HIGH (ref 11.5–15.5)
WBC: 7.2 10*3/uL (ref 4.0–10.5)
nRBC: 0.3 % — ABNORMAL HIGH (ref 0.0–0.2)

## 2020-05-16 LAB — BASIC METABOLIC PANEL
Anion gap: 5 (ref 5–15)
BUN: 50 mg/dL — ABNORMAL HIGH (ref 6–20)
CO2: 22 mmol/L (ref 22–32)
Calcium: 7.7 mg/dL — ABNORMAL LOW (ref 8.9–10.3)
Chloride: 117 mmol/L — ABNORMAL HIGH (ref 98–111)
Creatinine, Ser: 1.26 mg/dL — ABNORMAL HIGH (ref 0.61–1.24)
GFR, Estimated: >60 mL/min (ref >60)
Glucose, Bld: 130 mg/dL — ABNORMAL HIGH (ref 70–99)
Potassium: 3.8 mmol/L (ref 3.5–5.1)
Sodium: 144 mmol/L (ref 135–145)

## 2020-05-16 LAB — GLUCOSE, CAPILLARY
Glucose-Capillary: 104 mg/dL — ABNORMAL HIGH (ref 70–99)
Glucose-Capillary: 132 mg/dL — ABNORMAL HIGH (ref 70–99)
Glucose-Capillary: 117 mg/dL — ABNORMAL HIGH (ref 70–99)
Glucose-Capillary: 134 mg/dL — ABNORMAL HIGH (ref 70–99)
Glucose-Capillary: 112 mg/dL — ABNORMAL HIGH (ref 70–99)

## 2020-05-16 LAB — PROCALCITONIN: Procalcitonin: 2.85 ng/mL

## 2020-05-16 MED ORDER — OXYCODONE HCL 5 MG/5ML PO SOLN
5.0000 mg | ORAL | Status: DC | PRN
  Administered 2020-05-16: 5 mg
  Filled 2020-05-16: qty 5

## 2020-05-16 NOTE — Progress Notes (Signed)
[facility] for Infectious Disease    Date of Admission:  05/12/2020   Total days of antibiotics 5           ID: [name] [name] is a 60 y.o. male with advanced hiv disease, poorly controlle, admitted with streptococcal sepsis found to have bacteremia, hip pji, also multiple subacute ischemic infarct, concerning for septic emboli for presumed endocarditis Principal Problem:   Septic shock ([place]) Active Problems:   Human immunodeficiency virus (HIV) disease ([place])   AKI (acute kidney injury) ([place])   Lactic acidosis   SDH (subdural hematoma) ([facility])   Hypothermia due to exposure   Cellulitis   Elevated brain natriuretic peptide (BNP) level   Volume overload   Dehydration   Pressure injury of skin   Subdural empyema   Streptococcal bacteremia   Malnutrition of moderate degree    Subjective: His family member at his bedside reports some improvement in mentation. Able to say his name and place([place]) still does not follow commands or talk freely but improved in the last 48hrs.   Still requiring vasopressors  Medications:  . chlorhexidine  15 mL Mouth Rinse BID  . Chlorhexidine Gluconate Cloth  6 each Topical Daily  . collagenase   Topical Daily  . darunavir-cobicistat  1 tablet Oral Daily  . dolutegravir  50 mg Per Tube Daily  . feeding supplement (PROSource TF)  45 mL Per Tube BID  . free water  200 mL Per Tube Q4H  . mouth rinse  15 mL Mouth Rinse q12n4p  . nutrition supplement (JUVEN)  1 packet Per Tube BID BM  . pantoprazole (PROTONIX) IV  40 mg Intravenous QHS    Objective: Vital signs in last 24 hours: Temp:  [97.9 F (36.6 C)-101 F (38.3 C)] 97.9 F (36.6 C) (05/08 1200) Pulse Rate:  [79-104] 79 (05/08 1200) Resp:  [16-30] 20 (05/08 1200) BP: (96-139)/(51-96) 108/61 (05/08 1200) SpO2:  [95 %-100 %] 99 % (05/08 1200) Weight:  [69.9 kg] 69.9 kg (05/08 0500) Physical Exam  Constitutional: He is oriented to person, place, and time. He appears chronically  ill and mal-nourished. No distress.  HENT: pallor Mouth/Throat: Oropharynx is clear and moist. No oropharyngeal exudate. Exudate on gums. Ng in place Cardiovascular: Normal rate, regular rhythm and normal heart sounds. Exam reveals no gallop and no friction rub.  No murmur heard.  Pulmonary/Chest: Effort normal and breath sounds normal. No respiratory distress. He has no wheezes.  Abdominal: Soft. Bowel sounds are normal. He exhibits no distension. There is no tenderness.  Lymphadenopathy:  He has no cervical adenopathy.  Neurological: He is alert and oriented to person, place, and time.  Skin: Skin is warm and dry. No rash noted. No erythema.  Ext: right arm swelling/ anasarca. Scattered echymosis. Warm appendages.  Psychiatric: He has a normal mood and affect. His behavior is normal.     Lab Results Recent Labs    05/14/20 2302 05/15/20 0603 05/16/20 0754  WBC 11.2*  --  7.2  HGB 9.8*  --  8.6*  HCT 28.5*  --  26.4*  NA  --  149* 144  K  --  3.5 3.8  CL  --  125* 117*  CO2  --  19* 22  BUN  --  53* 50*  CREATININE  --  1.42* 1.26*   Liver Panel Recent Labs    05/14/20 0244 05/15/20 0603  PROT 4.7* 4.7*  ALBUMIN 1.7* 1.6*  AST 73* 46*  ALT 42  52  ALKPHOS 47 60  BILITOT 2.0* 1.6*    Microbiology: 5/6 hip = no growth 5/4 blood cx = group G strep Studies/Results: DG FLUORO GUIDED NEEDLE PLC ASPIRATION/INJECTION LOC  Result Date: 05/14/2020 CLINICAL DATA:  Right hip periprosthetic fluid collection. FLUOROSCOPY TIME:  Radiation Exposure Index (as provided by the fluoroscopic device): 1.1 mGy Fluoroscopy Time:  12 seconds Number of Acquired Images:  0 PROCEDURE: The risks and benefits of the procedure were discussed with the patient's sister, and written informed consent was obtained. A formal timeout procedure was performed with the patient according to departmental protocol. The patient was placed supine on the fluoroscopy table and the right hip joint was identified  under fluoroscopy. The skin overlying the right hip joint was subsequently cleaned with Betadine and a sterile drape was placed over the area of interest. 2 ml 1% Lidocaine was used to anesthetize the skin around the needle insertion site. A 20 gauge spinal needle was inserted into the right hip joint under fluoroscopy. 20 mL of cloudy, yellow fluid was aspirated and sent to the laboratory for analysis. The needle was removed and hemostasis was achieved. There were no immediate complications. IMPRESSION: Technically successful right hip aspiration. Purulent fluid aspirated. Electronically Signed   By: Titus Dubin M.D.   On: 05/14/2020 17:47     Assessment/Plan: hiv disease = continue on tivicay plus prezcobix ,but no TAF. Current cd 4 count of 109/ VL 866K. Unclear how long off of meds prior to admit, in November VL 1000.   Streptococcal sepsis = continue with ceftriaxone 2gm IV daily. Plan for 6 wk followed by orals for treating streptococcal pji. Wean levo as tolerated   CNS septic emboli = could explain mental status changes.continue with Q12hr dosing  oi proph = bactrim DS TID if can be crushed for NG  aki = appears improving   [facility] Cell: [phone] Pager: [phone]  05/16/2020, 4:14 PM

## 2020-05-16 NOTE — Progress Notes (Signed)
NAME:  [name] [name], MRN:  [id number], DOB:  1960/10/30, LOS: 3 ADMISSION DATE:  05/12/2020, CONSULTATION DATE:  5/4 REFERRING MD:  Rancour, CHIEF COMPLAINT:  Found down   History of Present Illness:  60 y/o male admitted from the [facility] ER on 5/4 after being found down.  Noted to have sepsis physiology and a subdural hemorrhage.  NSGY reported to the ER that there was no indication for an intervention.    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 CT ABD/Pelvis > no acute intra-abdominal process, evidence of sacral decubitus, slight increase compared to prior but no underlying fluid collection, left hip prosthesis   5/5 CT Head > posterior parafalcine SDH.  Cefepime, vanco, flagyl. Remains confused, GP bacteremia on BCID. Family reported 1 wk hx of GI c/o's, fell landing on right hip, concerned he has been drinking more & difficulties with medication identification.  MRI R Hip > large fluid collection surrounding right hip arthroplasty with minimal peripheral enhancement, fluid extends along the femoral component within the medullary cavity of the proximal femur. Small soft tissue defect along the right aspect of the gluteal cleft, bilateral hydroceles. MRI Brain with multifocal acute to subacute ischemic infarcts involving the bilateral cerebral and cerebellar hemispheres, associated mild scattered petechial hemorrhage - possible central thromboembolic etiology. Small subdural hemorrhage without shift. Chronic small vessel disease. Limited TTE b/c of patient cooperation, but no evidence of vegetation, GI DD. ABX changed to ceftriaxone.    Scheduled Meds: . chlorhexidine  15 mL Mouth Rinse BID  .  Chlorhexidine Gluconate Cloth  6 each Topical Daily  . collagenase   Topical Daily  . darunavir-cobicistat  1 tablet Oral Daily  . dolutegravir  50 mg Per Tube Daily  . feeding supplement (PROSource TF)  45 mL Per Tube BID  . free water  200 mL Per Tube Q4H  . mouth rinse  15 mL Mouth Rinse q12n4p  . nutrition supplement (JUVEN)  1 packet Per Tube BID BM  . pantoprazole (PROTONIX) IV  40 mg Intravenous QHS   Continuous Infusions: . cefTRIAXone (ROCEPHIN)  IV Stopped (05/15/20 2239)  . dextrose 5 % and 0.2 % NaCl 100 mL/hr at 05/16/20 0400  . feeding supplement (VITAL 1.5 CAL) Stopped (05/15/20 1000)  . norepinephrine (LEVOPHED) Adult infusion 3 mcg/min (05/16/20 0400)   PRN Meds:.docusate sodium, ondansetron (ZOFRAN) IV, polyethylene glycol    Interim History / Subjective:   a bit more alert today, oriented though only to person.  Thinks I'm a policeman when given a multiple choice question/ still on levophed drip  Objective   Blood pressure (!) 96/53, pulse 97, temperature 99.1 F (37.3 C), temperature source Axillary, resp. rate 19, height 6\' 2"  (1.88 m), weight 69.9 kg, SpO2 98 %.        Intake/Output Summary (Last 24 hours) at 05/16/2020 0910 Last data filed at 05/16/2020 0400 Gross per 24 hour  Intake 1910.62 ml  Output 2650 ml  Net -739.38 ml   Filed Weights   05/13/20 0500 05/15/20 0500 05/16/20 0500  Weight: 64 kg 67.1 kg 69.9 kg    Examination: Tmax  101 (new spike prior to plt trx) sats on RA = 100% General appearance:  Extremely debilitated No jvd Oropharynx poor oral hygiene Neck supple Lungs with a few scattered exp > insp rhonchi bilaterally RRR no s3 or or sign murmur Abd soft with nl  excursion  Extr warm with no edema or clubbing noted Neuro  AMS as above     Labs/imaging that I havepersonally reviewed  (right click and "Reselect all SmartList Selections" daily)     Resolved Hospital Problem list     Assessment & Plan:   Septic Shock due  to Strep Bacteremia  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  DDx includes skin/soft tissue source, endocarditis (but not seen on TTE), & AIDS related opportunistic infections.  History of B strep septic hip/psoas abscess.  Current MRI of right hip with large fluid collection. Has also had sacral decubitus for at least 8 years with healing mixed with opening of wound.  - Asp R Hip per IR 5/6  Wbc's, no org seen >>>   REC Levophed for MAP >65, wean as tol, continue IV fluids as note I/0's neg despite levophed dependence  - abx rx per ID  - likely will need TEE at some point   Fall with head trauma causing SDH Stable on head imaging, by report NSGY says no intervention needed after discussion with ER -MRI as above -follow neuro exam     Thrombocytopenia Lab Results  Component Value Date   PLT 48 (L) 05/16/2020   PLT 31 (L) 05/14/2020   PLT 35 (L) 05/14/2020   PLT 158 06/19/2008   PLT 197 12/19/2007   PLT 149 09/20/2007   S/p platelets 5/7 with minimal improvement on 5/8        Acute Metabolic Encephalopathy Mixed picture, metabolic cause from sepsis but confounded by head injury; doesn't appear to be in alcohol withdrawal but at risk for that; some improvement with fluids, antibiotics but he is far from his baseline (works, drives etc) -minimize all sedating medications  -MRI as above, concern for embolic phenomena and structural/ permanent deficits -PT/OT efforts when able   Sacral wound, chronic, present on admission Present for at least 8 years -WOC consulted  -pressure relieving measures   Hypernatremia> improving on hypotonic fluids  AKI > improving Hypokalemia > resolved  Hypophosphatemis> resolved Lab Results  Component Value Date   CREATININE 1.26 (H) 05/16/2020   CREATININE 1.42 (H) 05/15/2020   CREATININE 1.78 (H) 05/14/2020  Avoid  nephrotoxic agents, ensure adequate renal perfusion - improving 5/8  >>> 5/7 Kphos x 30 mmol/  changed fluids to d5 1/4 NS at 100 cc per hour  AIDS  Per family, he is very compliant with medications? Lymphopenia noted on CBC. CD4 <35 >>>  Defer to ID.   At Risk Malnutrition  -place small bore feeding tube -begin nutrition, free water PT  History of anal cancer, recent biopsy by Dr. Dema Severin negative for malignancy -supportive care   Skin changes feet > Kaposi's?  -per ID    Best practice (right click and "Reselect all SmartList Selections" daily)  Diet:  NPO Pain/Anxiety/Delirium protocol (if indicated): No VAP protocol (if indicated): Not indicated DVT prophylaxis: SCD GI prophylaxis: N/A Glucose control:  SSI No Central venous access:  N/A Arterial line:  N/A Foley:  N/A Mobility:  bed rest  PT consulted: N/A Last date of multidisciplinary goals of care discussion 5/6 -follow up conversation with sister [name], they  have not discussed code status as of yet.  Reviewed reversible / non-reversible illnesses. Discussed his overall state of health and current acute processes.   Code Status:  full code Disposition: ICU  The patient is critically ill with multiple organ systems failure and requires high complexity decision making for assessment and support, frequent evaluation and titration of therapies, application of advanced monitoring technologies and extensive interpretation of multiple databases. Critical Care Time devoted to patient care services described in this note is 35 minutes.    Christinia Gully, MD [facility] [phone]   After 7:00 pm call Elink  [phone]

## 2020-05-17 ENCOUNTER — Inpatient Hospital Stay (HOSPITAL_COMMUNITY): Payer: Medicare Other

## 2020-05-17 DIAGNOSIS — B20 Human immunodeficiency virus [HIV] disease: Secondary | ICD-10-CM | POA: Diagnosis not present

## 2020-05-17 DIAGNOSIS — R6521 Severe sepsis with septic shock: Secondary | ICD-10-CM | POA: Diagnosis not present

## 2020-05-17 DIAGNOSIS — R7881 Bacteremia: Secondary | ICD-10-CM | POA: Diagnosis not present

## 2020-05-17 DIAGNOSIS — M00251 Other streptococcal arthritis, right hip: Secondary | ICD-10-CM

## 2020-05-17 DIAGNOSIS — A419 Sepsis, unspecified organism: Secondary | ICD-10-CM | POA: Diagnosis not present

## 2020-05-17 LAB — CBC
HCT: 29.2 % — ABNORMAL LOW (ref 39.0–52.0)
Hemoglobin: 9.3 g/dL — ABNORMAL LOW (ref 13.0–17.0)
MCH: 35.9 pg — ABNORMAL HIGH (ref 26.0–34.0)
MCHC: 31.8 g/dL (ref 30.0–36.0)
MCV: 112.7 fL — ABNORMAL HIGH (ref 80.0–100.0)
Platelets: 49 10*3/uL — ABNORMAL LOW (ref 150–400)
RBC: 2.59 MIL/uL — ABNORMAL LOW (ref 4.22–5.81)
RDW: 17.4 % — ABNORMAL HIGH (ref 11.5–15.5)
WBC: 11.9 10*3/uL — ABNORMAL HIGH (ref 4.0–10.5)
nRBC: 0.3 % — ABNORMAL HIGH (ref 0.0–0.2)

## 2020-05-17 LAB — BASIC METABOLIC PANEL
Anion gap: 7 (ref 5–15)
BUN: 45 mg/dL — ABNORMAL HIGH (ref 6–20)
CO2: 22 mmol/L (ref 22–32)
Calcium: 7.6 mg/dL — ABNORMAL LOW (ref 8.9–10.3)
Chloride: 110 mmol/L (ref 98–111)
Creatinine, Ser: 1.31 mg/dL — ABNORMAL HIGH (ref 0.61–1.24)
GFR, Estimated: >60 mL/min (ref >60)
Glucose, Bld: 145 mg/dL — ABNORMAL HIGH (ref 70–99)
Potassium: 4.1 mmol/L (ref 3.5–5.1)
Sodium: 139 mmol/L (ref 135–145)

## 2020-05-17 LAB — PREPARE PLATELET PHERESIS
Unit division: 0
Unit division: 0

## 2020-05-17 LAB — BPAM PLATELET PHERESIS
Blood Product Expiration Date: 202205072359
Blood Product Expiration Date: 202205092359
ISSUE DATE / TIME: 202205061445
ISSUE DATE / TIME: 202205071837
Unit Type and Rh: 6200
Unit Type and Rh: 7300

## 2020-05-17 LAB — CULTURE, BLOOD (ROUTINE X 2)
Special Requests: ADEQUATE
Special Requests: ADEQUATE

## 2020-05-17 LAB — GLUCOSE, CAPILLARY
Glucose-Capillary: 120 mg/dL — ABNORMAL HIGH (ref 70–99)
Glucose-Capillary: 129 mg/dL — ABNORMAL HIGH (ref 70–99)
Glucose-Capillary: 134 mg/dL — ABNORMAL HIGH (ref 70–99)
Glucose-Capillary: 187 mg/dL — ABNORMAL HIGH (ref 70–99)
Glucose-Capillary: 190 mg/dL — ABNORMAL HIGH (ref 70–99)
Glucose-Capillary: 97 mg/dL (ref 70–99)

## 2020-05-17 LAB — CORTISOL: Cortisol, Plasma: 13.5 ug/dL

## 2020-05-17 MED ORDER — PANTOPRAZOLE SODIUM 40 MG PO PACK
40.0000 mg | PACK | ORAL | Status: DC
  Administered 2020-05-17 – 2020-05-18 (×2): 40 mg
  Filled 2020-05-17 (×2): qty 20

## 2020-05-17 MED ORDER — SULFAMETHOXAZOLE-TRIMETHOPRIM 800-160 MG PO TABS
1.0000 | ORAL_TABLET | Freq: Every day | ORAL | Status: DC
  Administered 2020-05-17 – 2020-05-19 (×3): 1
  Filled 2020-05-17 (×3): qty 1

## 2020-05-17 MED ORDER — HYDROCORTISONE NA SUCCINATE PF 100 MG IJ SOLR
50.0000 mg | Freq: Four times a day (QID) | INTRAMUSCULAR | Status: DC
  Administered 2020-05-17 – 2020-05-22 (×21): 50 mg via INTRAVENOUS
  Filled 2020-05-17 (×20): qty 2

## 2020-05-17 NOTE — Progress Notes (Signed)
NAME:  [name] [name], MRN:  [id number], DOB:  November 20, 1960, LOS: 4 ADMISSION DATE:  05/12/2020, CONSULTATION DATE:  5/4 REFERRING MD:  Rancour, CHIEF COMPLAINT:  Found down   History of Present Illness:  60 y/o male admitted from the [facility] ER on 5/4 after being found down.  Noted to have sepsis physiology and a subdural hemorrhage.  NSGY reported to the ER that there was no indication for an intervention (findings concerning for possible embolic ischemic infarcts), TTE negative. R Hip MRI with fluid collection, s/p aspiration per IR.    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 CT ABD/Pelvis > no acute intra-abdominal process, evidence of sacral decubitus, slight increase compared to prior but no underlying fluid collection, left hip prosthesis   5/5 CT Head > posterior parafalcine SDH.  Cefepime, vanco, flagyl. Remains confused, GP bacteremia on BCID. Family reported 1 wk hx of GI c/o's, fell landing on right hip, concerned he has been drinking more & difficulties with medication identification.  MRI R Hip > large fluid collection surrounding right hip arthroplasty with minimal peripheral enhancement, fluid extends along the femoral component within the medullary cavity of the proximal femur. Small soft tissue defect along the right aspect of the gluteal cleft, bilateral hydroceles. MRI Brain with multifocal acute to subacute ischemic infarcts involving the bilateral cerebral and cerebellar hemispheres, associated mild scattered petechial hemorrhage - possible central thromboembolic etiology. Small subdural hemorrhage without shift. Chronic small vessel disease. Limited TTE b/c of patient cooperation,  but no evidence of vegetation, GI DD. ABX changed to ceftriaxone.   5/6 Aspiration of R hip. Ortho aware of findings.    5/8 More alert, oriented to person. Remains on levophed gtt.    Interim History / Subjective:  Tmax 101.4 / WBC 11.9  On RA Glucose range 120-145  Hip culture with moderate WBC, no growth to date   Objective   Blood pressure 118/60, pulse (!) 155, temperature 99 F (37.2 C), temperature source Oral, resp. rate 17, height 6\' 2"  (1.88 m), weight 68.5 kg, SpO2 92 %.        Intake/Output Summary (Last 24 hours) at 05/17/2020 0758 Last data filed at 05/17/2020 0439 Gross per 24 hour  Intake 2692.98 ml  Output 3200 ml  Net -507.02 ml   Filed Weights   05/15/20 0500 05/16/20 0500 05/17/20 0500  Weight: 67.1 kg 69.9 kg 68.5 kg    Examination: General: chronically ill appearing adult male lying in bed in NAD HEENT: MM pink/moist, feeding tube in place Neuro: awakens to voice, interactive but profoundly weak CV: s1s2 RRR, no m/r/g PULM: non-labored on [place] O2, lungs bilaterally clear  GI: soft, bsx4 active  Extremities: warm/dry, no edema  Skin: discoloration on feet   Labs/imaging that I havepersonally reviewed  (right click and "Reselect all SmartList Selections" daily)  Micro from R hip aspiration > no growth but 31k WBC (neutrophil predominant), BCx2 with strep group G CBC - ongoing thrombocytopenia, anemia BMP - sr cr 1.31, improved hypernatremia PCXR - feeding tube in good position, no infiltrates  Resolved Hospital Problem list   Hypokalemia    Hypophosphatemia  Assessment & Plan:   Septic Shock due to Strep Bacteremia  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  DDx includes skin/soft tissue source, endocarditis (but not seen on TTE), & HIV related opportunistic infections.  History of B strep septic hip/psoas abscess.  Current  MRI of right hip with large fluid collection. Has also had sacral decubitus for at least 8 years with healing mixed with opening of wound.  -follow right hip aspiration culture, note 31k WBC/neutrophil predominant  -appreciate ID evaluation  -continue levophed for MAP >65  -assess cortisol  -begin stress dose steroids  -defer abx to ID, continue ceftriaxone  -will need TEE at some point   Fall with head trauma causing SDH Stable on head imaging, by report NSGY says no intervention needed after discussion with ER -follow neuro exam   Thrombocytopenia S/p platelets 5/7 with minimal improvement on 5/8  -follow CBC, slight improvement  -monitor for bleeding  -no indication for further platelet transfusion   Acute Metabolic Encephalopathy Mixed picture, metabolic cause from sepsis but confounded by head injury; doesn't appear to be in alcohol withdrawal but at risk for that; some improvement with fluids, antibiotics but he is far from his baseline (works, drives etc) -minimize sedating medications  -MRI as above, concern for embolic phenomena  -PT / OT efforts when able   Sacral wound, chronic, present on admission Present for at least 8 years -appreciate WOC input  -pressure relieving measures   Hypernatremia   AKI   -reduce D51/4NS to 75 ml/hr  -continue free water 200 ml Q4 -Trend BMP / urinary output -Replace electrolytes as indicated -Avoid nephrotoxic agents, ensure adequate renal perfusion  HIV Per family, he is very compliant with medications? Lymphopenia noted on CBC. CD4 <35 -per ID  -continueTivicay, Prezcobix  -Palliative Care consultation   Moderate Malnutrition  -continue feeding tube  -TF per Nutrition  -free water as above  -monitor electrolytes   History of anal cancer, recent biopsy by Dr. Dema Severin negative for malignancy -supportive care   Skin changes feet > Kaposi's?  -Per ID    Best practice (right click and "Reselect all SmartList Selections"  daily)  Diet:  NPO, TF  Pain/Anxiety/Delirium protocol (if indicated): No VAP protocol (if indicated): Not indicated DVT prophylaxis: SCD GI prophylaxis: N/A Glucose control:  SSI No Central venous access:  N/A Arterial line:  N/A Foley:  N/A Mobility:  bed rest  PT consulted: N/A Last date of multidisciplinary goals of care discussion 5/6 -follow up conversation with sister [name], they have not discussed code status as of yet.  Reviewed reversible / non-reversible illnesses. Discussed his overall state of health and current acute processes.   Code Status:  full code Disposition: ICU  Will follow up with sisters on arrival 5/9  CC Time: 55 minutes    Noe Gens, MSN, APRN, NP-C, AGACNP-BC [facility] 05/17/2020, 8:09 AM   Please see [email or web address] for pager details.   From 7A-7P if no response, please call [phone] After hours, please call ELink [phone]

## 2020-05-17 NOTE — Progress Notes (Signed)
[facility] for Infectious Disease   Reason for visit: Follow up on Streptococcal bacteremia  Interval History: pansensitive group G Strep in blood cultures 5/4.  WBC 11.9, T max 101.4.   Day 6 total antibiotics Day 5 ceftriaxone Darunavir/cobocistat + dolutegravir per tube SMP/TMP prophylaxis day 1  Physical Exam: Constitutional:  Vitals:   05/17/20 1200 05/17/20 1207  BP: (!) 77/43   Pulse: 81   Resp: 20   Temp:  98.5 F (36.9 C)  SpO2: 100%    patient appears in NAD; alert, slowed Respiratory: Normal respiratory effort; CTA B Cardiovascular: RRR GI: soft, nt, nd  Review of Systems: Constitutional: negative for fevers and chills Integument/breast: negative for rash Answers questions with yes or no  Lab Results  Component Value Date   WBC 11.9 (H) 05/17/2020   HGB 9.3 (L) 05/17/2020   HCT 29.2 (L) 05/17/2020   MCV 112.7 (H) 05/17/2020   PLT 49 (L) 05/17/2020    Lab Results  Component Value Date   CREATININE 1.31 (H) 05/17/2020   BUN 45 (H) 05/17/2020   NA 139 05/17/2020   K 4.1 05/17/2020   CL 110 05/17/2020   CO2 22 05/17/2020    Lab Results  Component Value Date   ALT 32 05/15/2020   AST 46 (H) 05/15/2020   ALKPHOS 60 05/15/2020     Microbiology: Recent Results (from the past 240 hour(s))  Blood Culture (routine x 2)     Status: Abnormal   Collection Time: 05/12/20  6:45 PM   Specimen: BLOOD  Result Value Ref Range Status   Specimen Description   Final    BLOOD RIGHT WRIST Performed at [facility], [place] [address] [place], [place] [address]    Special Requests   Final    BOTTLES DRAWN AEROBIC AND ANAEROBIC Blood Culture adequate volume   Culture  Setup Time   Final    GRAM POSITIVE COCCI IN BOTH AEROBIC AND ANAEROBIC BOTTLES CRITICAL RESULT CALLED TO, READ BACK BY AND VERIFIED WITH: PHARM D L.Wynona Canes ON DX:[id number] AT 0850 BY E.PARRISH Performed at [facility], [place] [address], [place], [place] [address]     Culture STREPTOCOCCUS GROUP G (A)  Final   Report Status 05/17/2020 FINAL  Final   Organism ID, Bacteria STREPTOCOCCUS GROUP G  Final      Susceptibility   Streptococcus group g - MIC*    CLINDAMYCIN <=0.25 SENSITIVE Sensitive     ERYTHROMYCIN <=0.12 SENSITIVE Sensitive     VANCOMYCIN 0.25 SENSITIVE Sensitive     CEFTRIAXONE <=0.12 SENSITIVE Sensitive     LEVOFLOXACIN 1 SENSITIVE Sensitive     * STREPTOCOCCUS GROUP G  Blood Culture ID Panel (Reflexed)     Status: Abnormal   Collection Time: 05/12/20  6:45 PM  Result Value Ref Range Status   Enterococcus faecalis NOT DETECTED NOT DETECTED Final   Enterococcus Faecium NOT DETECTED NOT DETECTED Final   Listeria monocytogenes NOT DETECTED NOT DETECTED Final   Staphylococcus species NOT DETECTED NOT DETECTED Final   Staphylococcus aureus (BCID) NOT DETECTED NOT DETECTED Final   Staphylococcus epidermidis NOT DETECTED NOT DETECTED Final   Staphylococcus lugdunensis NOT DETECTED NOT DETECTED Final   Streptococcus species DETECTED (A) NOT DETECTED Final    Comment: Not Enterococcus species, Streptococcus agalactiae, Streptococcus pyogenes, or Streptococcus pneumoniae. CRITICAL RESULT CALLED TO, READ BACK BY AND VERIFIED WITH: PHARM D L.POINDEXTER ON DX:[phone] AT 0850 BY E.PARRISH    Streptococcus agalactiae NOT DETECTED  NOT DETECTED Final   Streptococcus pneumoniae NOT DETECTED NOT DETECTED Final   Streptococcus pyogenes NOT DETECTED NOT DETECTED Final   A.calcoaceticus-baumannii NOT DETECTED NOT DETECTED Final   Bacteroides fragilis NOT DETECTED NOT DETECTED Final   Enterobacterales NOT DETECTED NOT DETECTED Final   Enterobacter cloacae complex NOT DETECTED NOT DETECTED Final   Escherichia coli NOT DETECTED NOT DETECTED Final   Klebsiella aerogenes NOT DETECTED NOT DETECTED Final   Klebsiella oxytoca NOT DETECTED NOT DETECTED Final   Klebsiella pneumoniae NOT DETECTED NOT DETECTED Final   Proteus species NOT DETECTED NOT DETECTED Final    Salmonella species NOT DETECTED NOT DETECTED Final   Serratia marcescens NOT DETECTED NOT DETECTED Final   Haemophilus influenzae NOT DETECTED NOT DETECTED Final   Neisseria meningitidis NOT DETECTED NOT DETECTED Final   Pseudomonas aeruginosa NOT DETECTED NOT DETECTED Final   Stenotrophomonas maltophilia NOT DETECTED NOT DETECTED Final   Candida albicans NOT DETECTED NOT DETECTED Final   Candida auris NOT DETECTED NOT DETECTED Final   Candida glabrata NOT DETECTED NOT DETECTED Final   Candida krusei NOT DETECTED NOT DETECTED Final   Candida parapsilosis NOT DETECTED NOT DETECTED Final   Candida tropicalis NOT DETECTED NOT DETECTED Final   Cryptococcus neoformans/gattii NOT DETECTED NOT DETECTED Final    Comment: Performed at [facility], [address], [place], [place] [address]  Blood Culture (routine x 2)     Status: Abnormal   Collection Time: 05/12/20  6:57 PM   Specimen: BLOOD  Result Value Ref Range Status   Specimen Description   Final    BLOOD LEFT ANTECUBITAL Performed at [facility] [address] [place], [place] [address]    Special Requests   Final    BOTTLES DRAWN AEROBIC AND ANAEROBIC Blood Culture adequate volume   Culture  Setup Time   Final    GRAM POSITIVE COCCI CRITICAL VALUE NOTED.  VALUE IS CONSISTENT WITH PREVIOUSLY REPORTED AND CALLED VALUE. IN BOTH AEROBIC AND ANAEROBIC BOTTLES    Culture (A)  Final    STREPTOCOCCUS GROUP G SUSCEPTIBILITIES PERFORMED ON PREVIOUS CULTURE WITHIN THE LAST 5 DAYS. Performed at [facility], [place] [address] [place], [place] [address]    Report Status 05/17/2020 FINAL  Final  Resp Panel by RT-PCR (Flu A&B, Covid) Nasopharyngeal Swab     Status: None   Collection Time: 05/12/20  7:00 PM   Specimen: Nasopharyngeal Swab; Nasopharyngeal(NP) swabs in vial transport medium  Result Value Ref Range Status   SARS Coronavirus 2 by RT PCR NEGATIVE NEGATIVE Final    Comment: (NOTE) SARS-CoV-2  target nucleic acids are NOT DETECTED.  The SARS-CoV-2 RNA is generally detectable in upper respiratory specimens during the acute phase of infection. The lowest concentration of SARS-CoV-2 viral copies this assay can detect is 138 copies/mL. A negative result does not preclude SARS-Cov-2 infection and should not be used as the sole basis for treatment or other patient management decisions. A negative result may occur with  improper specimen collection/handling, submission of specimen other than nasopharyngeal swab, presence of viral mutation(s) within the areas targeted by this assay, and inadequate number of viral copies(<138 copies/mL). A negative result must be combined with clinical observations, patient history, and epidemiological information. The expected result is Negative.  Fact Sheet for Patients:  EntrepreneurPulse.com.au  Fact Sheet for Healthcare Providers:  IncredibleEmployment.be  This test is no t yet approved or cleared by the [place] and  has been authorized  for detection and/or diagnosis of SARS-CoV-2 by FDA under an Emergency Use Authorization (EUA). This EUA will remain  in effect (meaning this test can be used) for the duration of the COVID-19 declaration under Section 564(b)(1) of the Act, 21 U.S.C.section 360bbb-3(b)(1), unless the authorization is terminated  or revoked sooner.       Influenza A by PCR NEGATIVE NEGATIVE Final   Influenza B by PCR NEGATIVE NEGATIVE Final    Comment: (NOTE) The Xpert Xpress SARS-CoV-2/FLU/RSV plus assay is intended as an aid in the diagnosis of influenza from Nasopharyngeal swab specimens and should not be used as a sole basis for treatment. Nasal washings and aspirates are unacceptable for Xpert Xpress SARS-CoV-2/FLU/RSV testing.  Fact Sheet for Patients: EntrepreneurPulse.com.au  Fact Sheet for Healthcare  Providers: IncredibleEmployment.be  This test is not yet approved or cleared by the [place] FDA and has been authorized for detection and/or diagnosis of SARS-CoV-2 by FDA under an Emergency Use Authorization (EUA). This EUA will remain in effect (meaning this test can be used) for the duration of the COVID-19 declaration under Section 564(b)(1) of the Act, 21 U.S.C. section 360bbb-3(b)(1), unless the authorization is terminated or revoked.  Performed at [facility], [place] [address] [place], [place] [address]   Urine culture     Status: Abnormal   Collection Time: 05/12/20  8:45 PM   Specimen: In/Out Cath Urine  Result Value Ref Range Status   Specimen Description   Final    IN/OUT CATH URINE Performed at [facility] [address], [place], [place] [address]    Special Requests   Final    NONE Performed at [facility], [place] [address], [place], [place] [address]    Culture MULTIPLE SPECIES PRESENT, SUGGEST RECOLLECTION (A)  Final   Report Status 05/14/2020 FINAL  Final  MRSA PCR Screening     Status: None   Collection Time: 05/13/20  5:28 AM   Specimen: Nasopharyngeal  Result Value Ref Range Status   MRSA by PCR NEGATIVE NEGATIVE Final    Comment:        The GeneXpert MRSA Assay (FDA approved for NASAL specimens only), is one component of a comprehensive MRSA colonization surveillance program. It is not intended to diagnose MRSA infection nor to guide or monitor treatment for MRSA infections. Performed at [facility], [place] [address] [place], [place] [address]   Body fluid culture w Gram Stain     Status: None (Preliminary result)   Collection Time: 05/14/20  3:29 PM   Specimen: Joint, Hip; Body Fluid  Result Value Ref Range Status   Specimen Description   Final    HIP RIGHT Performed at [place] [address] [place], [place]  [address]    Special Requests   Final    NONE Performed at [facility], [place] [address], [place], [place] [address]    Gram Stain   Final    MODERATE WBC PRESENT,BOTH PMN AND MONONUCLEAR NO ORGANISMS SEEN    Culture   Final    NO GROWTH 2 DAYS Performed at [facility], [place] [address], [place], [place] [address]    Report Status PENDING  Incomplete    Impression/Plan:  1. Streptococcal group G bacteremia with shock - on ceftriaxone and will continue with this at high, twice daily, dosing.  He will need a prolonged course.  TTE did not note any vegetation though presumed due  to CNS emboli.  Will need a prolonged course regardless, so no absolute indication for a TEE from ID standpoint.    2.  Left hip fluid collection - unclear if it is a septic prosthetic hip infection and aspiration done.  No organisms noted and no growth in the culture.  WBCs noted.    3.  HIV - on treatment with Prezcobix and dolutegravir per tube since home regimen not able to be crushed.  Most recent CD4 count just 107 and viral load 866,000, at his baseline.   This is consistent with poor compliance.    4.  OI risk - started on Bactrim for prophyaxis.

## 2020-05-17 NOTE — TOC Progression Note (Signed)
Transition of Care [facility]) - Progression Note    Patient Details  Name: [name] [name] MRN: [id number] Date of Birth: 31-Oct-1960  [facility]) CM/SW Contact  Leeroy Cha, RN Phone Number: 05/17/2020, 9:47 AM  Clinical Narrative:    60 y/o male admitted from the [facility] ER on 5/4 after being found down.  Noted to have sepsis physiology and a subdural hemorrhage.  NSGY reported to the ER that there was no indication for an intervention (findings concerning for possible embolic ischemic infarcts), TTE negative. R Hip MRI with fluid collection, s/p aspiration per IR.    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 CT ABD/Pelvis > no acute intra-abdominal process, evidence of sacral decubitus, slight increase compared to prior but no underlying fluid collection, left hip prosthesis   5/5 CT Head > posterior parafalcine SDH.  Cefepime, vanco, flagyl. Remains confused, GP bacteremia on BCID. Family reported 1 wk hx of GI c/o's, fell landing on right hip, concerned he has been drinking more & difficulties with medication identification.  MRI R Hip > large fluid collection surrounding right hip arthroplasty with minimal peripheral enhancement, fluid extends along the femoral component within the medullary cavity of the proximal femur. Small soft tissue defect along the right aspect of the gluteal cleft, bilateral hydroceles. MRI Brain with multifocal acute to subacute ischemic infarcts involving the bilateral cerebral and cerebellar hemispheres, associated mild scattered petechial hemorrhage - possible central thromboembolic etiology. Small subdural hemorrhage without shift. Chronic small  vessel disease. Limited TTE b/c of patient cooperation, but no evidence of vegetation, GI DD. ABX changed to ceftriaxone.   5/6 Aspiration of R hip. Ortho aware of findings.    5/8 More alert, oriented to person. Remains on levophed gtt.    PLAN:following for progression and toc needs.   Expected Discharge Plan: Home/Self Care Barriers to Discharge: Continued Medical Work up  Expected Discharge Plan and Services Expected Discharge Plan: Home/Self Care   Discharge Planning Services: CM Consult   Living arrangements for the past 2 months: Apartment                                       Social Determinants of Health (SDOH) Interventions    Readmission Risk Interventions No flowsheet data found.

## 2020-05-18 DIAGNOSIS — Z515 Encounter for palliative care: Secondary | ICD-10-CM

## 2020-05-18 DIAGNOSIS — E2740 Unspecified adrenocortical insufficiency: Secondary | ICD-10-CM

## 2020-05-18 DIAGNOSIS — E440 Moderate protein-calorie malnutrition: Secondary | ICD-10-CM

## 2020-05-18 DIAGNOSIS — B20 Human immunodeficiency virus [HIV] disease: Secondary | ICD-10-CM | POA: Diagnosis not present

## 2020-05-18 DIAGNOSIS — R6521 Severe sepsis with septic shock: Secondary | ICD-10-CM | POA: Diagnosis not present

## 2020-05-18 DIAGNOSIS — R531 Weakness: Secondary | ICD-10-CM

## 2020-05-18 DIAGNOSIS — A419 Sepsis, unspecified organism: Secondary | ICD-10-CM | POA: Diagnosis not present

## 2020-05-18 DIAGNOSIS — Z7189 Other specified counseling: Secondary | ICD-10-CM

## 2020-05-18 DIAGNOSIS — N179 Acute kidney failure, unspecified: Secondary | ICD-10-CM | POA: Diagnosis not present

## 2020-05-18 LAB — GLUCOSE, CAPILLARY
Glucose-Capillary: 157 mg/dL — ABNORMAL HIGH (ref 70–99)
Glucose-Capillary: 163 mg/dL — ABNORMAL HIGH (ref 70–99)
Glucose-Capillary: 169 mg/dL — ABNORMAL HIGH (ref 70–99)
Glucose-Capillary: 176 mg/dL — ABNORMAL HIGH (ref 70–99)
Glucose-Capillary: 185 mg/dL — ABNORMAL HIGH (ref 70–99)
Glucose-Capillary: 190 mg/dL — ABNORMAL HIGH (ref 70–99)
Glucose-Capillary: 193 mg/dL — ABNORMAL HIGH (ref 70–99)

## 2020-05-18 LAB — BASIC METABOLIC PANEL
Anion gap: 5 (ref 5–15)
BUN: 52 mg/dL — ABNORMAL HIGH (ref 6–20)
CO2: 22 mmol/L (ref 22–32)
Calcium: 7.9 mg/dL — ABNORMAL LOW (ref 8.9–10.3)
Chloride: 110 mmol/L (ref 98–111)
Creatinine, Ser: 1.33 mg/dL — ABNORMAL HIGH (ref 0.61–1.24)
GFR, Estimated: >60 mL/min (ref >60)
Glucose, Bld: 198 mg/dL — ABNORMAL HIGH (ref 70–99)
Potassium: 4.1 mmol/L (ref 3.5–5.1)
Sodium: 137 mmol/L (ref 135–145)

## 2020-05-18 LAB — CBC
HCT: 23.8 % — ABNORMAL LOW (ref 39.0–52.0)
Hemoglobin: 7.8 g/dL — ABNORMAL LOW (ref 13.0–17.0)
MCH: 36.1 pg — ABNORMAL HIGH (ref 26.0–34.0)
MCHC: 32.8 g/dL (ref 30.0–36.0)
MCV: 110.2 fL — ABNORMAL HIGH (ref 80.0–100.0)
Platelets: 63 10*3/uL — ABNORMAL LOW (ref 150–400)
RBC: 2.16 MIL/uL — ABNORMAL LOW (ref 4.22–5.81)
RDW: 17.2 % — ABNORMAL HIGH (ref 11.5–15.5)
WBC: 12.2 10*3/uL — ABNORMAL HIGH (ref 4.0–10.5)
nRBC: 0.3 % — ABNORMAL HIGH (ref 0.0–0.2)

## 2020-05-18 LAB — BODY FLUID CULTURE W GRAM STAIN: Culture: NO GROWTH

## 2020-05-18 LAB — HEMOGLOBIN A1C
Hgb A1c MFr Bld: 5.5 % (ref 4.8–5.6)
Mean Plasma Glucose: 111.15 mg/dL

## 2020-05-18 MED ORDER — INSULIN ASPART 100 UNIT/ML IJ SOLN
0.0000 [IU] | INTRAMUSCULAR | Status: DC
  Administered 2020-05-18 – 2020-05-19 (×6): 1 [IU] via SUBCUTANEOUS

## 2020-05-18 MED ORDER — LACTATED RINGERS IV SOLN: INTRAVENOUS | Status: DC

## 2020-05-18 MED ORDER — FOOD THICKENER (SIMPLYTHICK): 10.0000 | ORAL | Status: DC | PRN

## 2020-05-18 NOTE — Evaluation (Signed)
Clinical/Bedside Swallow Evaluation Patient Details  Name: [name] [name] MRN: [id number] Date of Birth: Sep 27, 1960  Today's Date: 05/18/2020 Time: SLP Start Time (ACUTE ONLY): 3220 SLP Stop Time (ACUTE ONLY): 1310 SLP Time Calculation (min) (ACUTE ONLY): 35 min  Past Medical History:  Past Medical History:  Diagnosis Date  . Arthritis   . Decubitus ulcer of sacral area   . DVT (deep venous thrombosis) ([facility]) 2008   Left leg  . Exposure to hepatitis B   . H/O hypotension   . History of anemia   . History of gout   . History of transfusion   . 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 HIV (human immunodeficiency virus infection) ([place])   . Hx of sepsis 2015   affected rt hip / femur  . Pain in limb 07/23/2013  . PONV (postoperative nausea and vomiting)    Hiccups for 4 days after anesthesia  . Radiation   . 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 Shingles    left shoulder, right leg   Past Surgical History:  Past Surgical History:  Procedure Laterality Date  . COLONOSCOPY    . CONVERSION TO TOTAL HIP Right 06/15/2014   Procedure: REMOVAL OF ANTIBIOTIC  SPACER AND CONVERSION TO RIGHT TOTAL HIP  ARTHROPLASTY ;  Surgeon: Paralee Cancel, MD;  Location: [facility];  Service: Orthopedics;  Laterality: Right;  . CYSTOSCOPY    . HEMATOMA EVACUATION    . HERNIA REPAIR     right X2  . INCISION AND DRAINAGE HIP Right 07/31/2013   Procedure: IRRIGATION AND DEBRIDEMENT HIP WITH PERCUTANEOUS DRAIN PLACEMENT;  Surgeon: [name], MD;  Location: [facility];  Service: Orthopedics;  Laterality: Right;  . INCISION AND DRAINAGE HIP Right 03/15/2015   Procedure: IRRIGATION AND DEBRIDEMENT RIGHT HIP WITH  HEAD BALL  AND POLY LINER EXCHANGE;  Surgeon: Paralee Cancel, MD;  Location: [facility];  Service: Orthopedics;  Laterality: Right;  . INCISION AND DRAINAGE OF WOUND N/A 08/25/2013   Procedure: IRRIGATION AND DEBRIDEMENT OF SACRAL ULCER WITH PLACEMENT OF A CELL ;  Surgeon: Theodoro Kos, DO;   Location: [facility];  Service: Plastics;  Laterality: N/A;  . INCISION AND DRAINAGE OF WOUND N/A 09/01/2013   Procedure: IRRIGATION AND DEBRIDEMENT SACRAL ULCER WITH PLACEMENT OF A CELL;  Surgeon: Theodoro Kos, DO;  Location: [facility];  Service: Plastics;  Laterality: N/A;  . INCISION AND DRAINAGE OF WOUND N/A 09/08/2013   Procedure: IRRIGATION AND DEBRIDEMENT OF SACRAL ULCER WITH PLACEMENT OF A CELL AND VAC;  Surgeon: Theodoro Kos, DO;  Location: [name];  Service: Plastics;  Laterality: N/A;  . INCISION AND DRAINAGE OF WOUND N/A 09/18/2013   Procedure: IRRIGATION AND DEBRIDEMENT OF SACRAL WOUND WITH PLACEMENT OF A-CELL;  Surgeon: Theodoro Kos, DO;  Location: [facility];  Service: Plastics;  Laterality: N/A;  . PILONIDAL CYST DRAINAGE N/A 08/09/2013   Procedure: IRRIGATION AND DEBRIDEMENT SACRAL DECUBITUS;  Surgeon: Harl Bowie, MD;  Location: [facility];  Service: General;  Laterality: N/A;  . RECTAL BIOPSY N/A 03/29/2020   Procedure: EXCISION OF PERIANAL TISSUE WITH BX AND 3 PUNCH BXS;  Surgeon: Ileana Roup, MD;  [facility];  Service: General;  Laterality: N/A;  . TOTAL HIP ARTHROPLASTY Right 08/04/2013   Procedure: RESECTION HIP JOINT - PLACEMENT OF CEMENT PROSTHESIS;  Surgeon: [name], MD;  Location: [facility];  Service: Orthopedics;  Laterality: Right;  . TRANSURETHRAL RESECTION OF  PROSTATE  JAN 2016   HPI:  adm w/AMS d/t sepsis- fall possibly after ETOH consumption, down for 2 days , found to have a small subdural hemorrhage, bilateral cerebral and cerebellar hemispheres events. Pt also with hypothermal upon admit - bear hugger, has sacral wound with h/o Rectal ca s/p radiation, HIV.  He has Dohoff tube in place and has demonstrated improved mentation thus for swallow eval ordered,  Lungs were clear at admit, now with rhonchi, CT neck Prominent spondylosis at C5-6 and C6-7. Mild diffuse facet hypertrophy.   Assessment / Plan / Recommendation Clinical Impression  Pt awake with Dobhoff in  place upon entrance to room, smiles frequently and is friendly. He demonstrates attention difficulties resulting in inconsistent responses and intermittent echolalia at times.  Dental brushing provided with toothbrush/paste (cleanliness noted to be excellent prior to SLP providing oral care).  CN exam unremarkable but pt has generalized weakness including weak volitional cough.   Single ice chips, tsp applesauce, small bolus of cracker, straw bolus of thin and nectar thick via tsp, cup and straw provided.  Pt with signficantly delayed swallow determined by delayed laryngeal elevation followed by throat clearing and delayed weak cough post ice chip and thin concerning for airway infiltration.  Cues to cough strongly and re-swallow were not followed.  No overt indication of airway compromise with nectar nor puree.  Prolonged mastication ability - likely due to decreased sustained attention observed.  Pt also tends to extend head upward and pull away from SLP when SLP presents boluses to him - ? due to fear of discomfort and/or gustatory changes. - Pt denies either.  Frequent belching observed during session and pt admits to some reflux - thus recommend [facility] stay above 30*.   However he is noted to wince and moan with any movement to reposition in bed.  Due to pt's weak volitional cough and decreased sustained attention - recommend continue tube feeding and allow nectar thick via cup/straw/tsp and thin water via tsp pending [place] meeting and/or pt progress. Will follow up next date to help in planning readiness for po diet, indication for instrumental swallow evaluation and care plan.  Educated pt to care plan/recommendations and provided written instructions. SLP Visit Diagnosis: Dysphagia, oropharyngeal phase (R13.12)    Aspiration Risk  Moderate aspiration risk;Risk for inadequate nutrition/hydration    Diet Recommendation NPO;Nectar-thick liquid (tsps thin - no food trays)   Medication Administration: Via  alternative means (or crushed with puree) Supervision: Full supervision/cueing for compensatory strategies Compensations: Slow rate;Small sips/bites;Other (Comment) (allow time for pt to conduct dry swallow) Postural Changes: Seated upright at 90 degrees;Remain upright for at least 30 minutes after po intake    Other  Recommendations Oral Care Recommendations: Oral care QID Other Recommendations: Order thickener from pharmacy   Follow up Recommendations Skilled Nursing facility      Frequency and Duration min 2x/week  2 weeks       Prognosis Prognosis for Safe Diet Advancement: Fair Barriers to Reach Goals: Cognitive deficits      Swallow Study   General Date of Onset: 05/18/20 HPI: adm w/AMS d/t sepsis- fall possibly after ETOH consumption, down for 2 days , found to have a small subdural hemorrhage, bilateral cerebral and cerebellar hemispheres events. Pt also with hypothermal upon admit - bear hugger, has sacral wound with h/o Rectal ca s/p radiation, HIV.  He has Dohoff tube in place and has demonstrated improved mentation thus for swallow eval ordered,  Lungs were clear at admit,  now with rhonchi, CT neck Prominent spondylosis at C5-6 and C6-7. Mild diffuse facet hypertrophy. Type of Study: Bedside Swallow Evaluation Previous Swallow Assessment: none in epic Diet Prior to this Study: NPO Temperature Spikes Noted: No Respiratory Status: Room air History of Recent Intubation: No Behavior/Cognition: Alert;Lethargic/Drowsy Oral Cavity Assessment: Within Functional Limits Oral Care Completed by SLP: Yes Oral Cavity - Dentition: Adequate natural dentition;Other (Comment) (missing posterior dentition) Self-Feeding Abilities: Total assist (right arm edematous, left hand edematous - pt did not attempt to feed himself) Patient Positioning: Upright in bed Baseline Vocal Quality: Low vocal intensity Volitional Cough: Weak Volitional Swallow:  (inconsistently able to elicit)     Oral/Motor/Sensory Function Overall Oral Motor/Sensory Function: Generalized oral weakness   Ice Chips Ice chips: Impaired Presentation: Spoon Oral Phase Impairments: Reduced lingual movement/coordination Oral Phase Functional Implications: Other (comment);Prolonged oral transit Pharyngeal Phase Impairments: Suspected delayed Swallow;Throat Clearing - Delayed;Cough - Delayed   Thin Liquid Thin Liquid: Impaired Presentation: Cup;Spoon Oral Phase Impairments: Reduced lingual movement/coordination Oral Phase Functional Implications: Oral holding Pharyngeal  Phase Impairments: Suspected delayed Swallow;Throat Clearing - Immediate    Nectar Thick Nectar Thick Liquid: Impaired Presentation: Straw;Cup;Spoon Oral Phase Impairments: Reduced lingual movement/coordination Oral phase functional implications: Oral holding Pharyngeal Phase Impairments: Suspected delayed Swallow;Throat Clearing - Delayed   Honey Thick Honey Thick Liquid: Not tested   Puree Puree: Impaired Presentation: Spoon Oral Phase Impairments: Reduced lingual movement/coordination Oral Phase Functional Implications: Oral holding;Prolonged oral transit Pharyngeal Phase Impairments: Suspected delayed Swallow;Throat Clearing - Immediate   Solid     Solid: Impaired Oral Phase Impairments: Impaired mastication Oral Phase Functional Implications: Prolonged oral transit;Impaired mastication Pharyngeal Phase Impairments: Suspected delayed Swallow      [name] [name] 05/18/2020,2:29 PM   Kathleen Lime, MS [facility] SLP Acute Rehab Services Office [phone] Pager [phone]

## 2020-05-18 NOTE — Progress Notes (Signed)
[facility]   Reason for visit: follow up on Streptococcal bacteremia   Interval History: no new concerns, sister at bedside. WBC 12.2, afebrile.   Day 7 total antibiotics Day 6 ceftriaxone Darunavir/cobocistat + dolutegravir per tube SMP/TMP prophylaxis day 2  Physical Exam: Constitutional:  Vitals:   05/18/20 0853 05/18/20 1225  BP:    Pulse:    Resp:    Temp: 98.1 F (36.7 C) 97.9 F (36.6 C)  SpO2:    [name] [name] is more alert compared to yesterday, nad Respiratory: normal respiratory effort; CTA B Cardiovascular: RRR  Review of Systems: Constitutional: negative for fever or chills Integument/breast: negative for rash  Lab Results  Component Value Date   WBC 12.2 (H) 05/18/2020   HGB 7.8 (L) 05/18/2020   HCT 23.8 (L) 05/18/2020   MCV 110.2 (H) 05/18/2020   PLT 63 (L) 05/18/2020    Lab Results  Component Value Date   CREATININE 1.33 (H) 05/18/2020   BUN 52 (H) 05/18/2020   NA 137 05/18/2020   K 4.1 05/18/2020   CL 110 05/18/2020   CO2 22 05/18/2020    Lab Results  Component Value Date   ALT 32 05/15/2020   AST 46 (H) 05/15/2020   ALKPHOS 60 05/15/2020     Microbiology: Recent Results (from the past 240 hour(s))  Blood Culture (routine x 2)     Status: Abnormal   Collection Time: 05/12/20  6:45 PM   Specimen: BLOOD  Result Value Ref Range Status   Specimen Description   Final    BLOOD RIGHT WRIST Performed at [facility], [place] [address], [place], [place] [address]    Special Requests   Final    BOTTLES DRAWN AEROBIC AND ANAEROBIC Blood Culture adequate volume   Culture  Setup Time   Final    GRAM POSITIVE COCCI IN BOTH AEROBIC AND ANAEROBIC BOTTLES CRITICAL RESULT CALLED TO, READ BACK BY AND VERIFIED WITH: PHARM D L.Wynona Canes ON [id number] AT 0850 BY E.PARRISH Performed at [facility], [place] [address] [place], [place] [address]    Culture STREPTOCOCCUS GROUP G (A)  Final   Report Status  05/17/2020 FINAL  Final   Organism ID, Bacteria STREPTOCOCCUS GROUP G  Final      Susceptibility   Streptococcus group g - MIC*    CLINDAMYCIN <=0.25 SENSITIVE Sensitive     ERYTHROMYCIN <=0.12 SENSITIVE Sensitive     VANCOMYCIN 0.25 SENSITIVE Sensitive     CEFTRIAXONE <=0.12 SENSITIVE Sensitive     LEVOFLOXACIN 1 SENSITIVE Sensitive     * STREPTOCOCCUS GROUP G  Blood Culture ID Panel (Reflexed)     Status: Abnormal   Collection Time: 05/12/20  6:45 PM  Result Value Ref Range Status   Enterococcus faecalis NOT DETECTED NOT DETECTED Final   Enterococcus Faecium NOT DETECTED NOT DETECTED Final   Listeria monocytogenes NOT DETECTED NOT DETECTED Final   Staphylococcus species NOT DETECTED NOT DETECTED Final   Staphylococcus aureus (BCID) NOT DETECTED NOT DETECTED Final   Staphylococcus epidermidis NOT DETECTED NOT DETECTED Final   Staphylococcus lugdunensis NOT DETECTED NOT DETECTED Final   Streptococcus species DETECTED (A) NOT DETECTED Final    Comment: Not Enterococcus species, Streptococcus agalactiae, Streptococcus pyogenes, or Streptococcus pneumoniae. CRITICAL RESULT CALLED TO, READ BACK BY AND VERIFIED WITH: PHARM D L.POINDEXTER ON [id number] AT 0850 BY E.PARRISH    Streptococcus agalactiae NOT DETECTED NOT DETECTED Final   Streptococcus pneumoniae NOT DETECTED NOT DETECTED  Final   Streptococcus pyogenes NOT DETECTED NOT DETECTED Final   A.calcoaceticus-baumannii NOT DETECTED NOT DETECTED Final   Bacteroides fragilis NOT DETECTED NOT DETECTED Final   Enterobacterales NOT DETECTED NOT DETECTED Final   Enterobacter cloacae complex NOT DETECTED NOT DETECTED Final   Escherichia coli NOT DETECTED NOT DETECTED Final   Klebsiella aerogenes NOT DETECTED NOT DETECTED Final   Klebsiella oxytoca NOT DETECTED NOT DETECTED Final   Klebsiella pneumoniae NOT DETECTED NOT DETECTED Final   Proteus species NOT DETECTED NOT DETECTED Final   Salmonella species NOT DETECTED NOT DETECTED Final    Serratia marcescens NOT DETECTED NOT DETECTED Final   Haemophilus influenzae NOT DETECTED NOT DETECTED Final   Neisseria meningitidis NOT DETECTED NOT DETECTED Final   Pseudomonas aeruginosa NOT DETECTED NOT DETECTED Final   Stenotrophomonas maltophilia NOT DETECTED NOT DETECTED Final   Candida albicans NOT DETECTED NOT DETECTED Final   Candida auris NOT DETECTED NOT DETECTED Final   Candida glabrata NOT DETECTED NOT DETECTED Final   Candida krusei NOT DETECTED NOT DETECTED Final   Candida parapsilosis NOT DETECTED NOT DETECTED Final   Candida tropicalis NOT DETECTED NOT DETECTED Final   Cryptococcus neoformans/gattii NOT DETECTED NOT DETECTED Final    Comment: Performed at [facility], [place] [address] [place], [place] [address]  Blood Culture (routine x 2)     Status: Abnormal   Collection Time: 05/12/20  6:57 PM   Specimen: BLOOD  Result Value Ref Range Status   Specimen Description   Final    BLOOD LEFT ANTECUBITAL Performed at [place] [address] [place], [place] [address]    Special Requests   Final    BOTTLES DRAWN AEROBIC AND ANAEROBIC Blood Culture adequate volume   Culture  Setup Time   Final    GRAM POSITIVE COCCI CRITICAL VALUE NOTED.  VALUE IS CONSISTENT WITH PREVIOUSLY REPORTED AND CALLED VALUE. IN BOTH AEROBIC AND ANAEROBIC BOTTLES    Culture (A)  Final    STREPTOCOCCUS GROUP G SUSCEPTIBILITIES PERFORMED ON PREVIOUS CULTURE WITHIN THE LAST 5 DAYS. Performed at [facility], [place] [address], [place], [place] [address]    Report Status 05/17/2020 FINAL  Final  Resp Panel by RT-PCR (Flu A&B, Covid) Nasopharyngeal Swab     Status: None   Collection Time: 05/12/20  7:00 PM   Specimen: Nasopharyngeal Swab; Nasopharyngeal(NP) swabs in vial transport medium  Result Value Ref Range Status   SARS Coronavirus 2 by RT PCR NEGATIVE NEGATIVE Final    Comment: (NOTE) SARS-CoV-2 target nucleic acids are NOT DETECTED.  The SARS-CoV-2 RNA  is generally detectable in upper respiratory specimens during the acute phase of infection. The lowest concentration of SARS-CoV-2 viral copies this assay can detect is 138 copies/mL. A negative result does not preclude SARS-Cov-2 infection and should not be used as the sole basis for treatment or other patient management decisions. A negative result may occur with  improper specimen collection/handling, submission of specimen other than nasopharyngeal swab, presence of viral mutation(s) within the areas targeted by this assay, and inadequate number of viral copies(<138 copies/mL). A negative result must be combined with clinical observations, patient history, and epidemiological information. The expected result is Negative.  Fact Sheet for Patients:  EntrepreneurPulse.com.au  Fact Sheet for Healthcare Providers:  IncredibleEmployment.be  This test is no t yet approved or cleared by the [place] FDA and  has been authorized for detection and/or diagnosis of SARS-CoV-2 by FDA under an Emergency  Use Authorization (EUA). This EUA will remain  in effect (meaning this test can be used) for the duration of the COVID-19 declaration under Section 564(b)(1) of the Act, 21 U.S.C.section 360bbb-3(b)(1), unless the authorization is terminated  or revoked sooner.       Influenza A by PCR NEGATIVE NEGATIVE Final   Influenza B by PCR NEGATIVE NEGATIVE Final    Comment: (NOTE) The Xpert Xpress SARS-CoV-2/FLU/RSV plus assay is intended as an aid in the diagnosis of influenza from Nasopharyngeal swab specimens and should not be used as a sole basis for treatment. Nasal washings and aspirates are unacceptable for Xpert Xpress SARS-CoV-2/FLU/RSV testing.  Fact Sheet for Patients: EntrepreneurPulse.com.au  Fact Sheet for Healthcare Providers: IncredibleEmployment.be  This test is not yet approved or cleared by the  [place] FDA and has been authorized for detection and/or diagnosis of SARS-CoV-2 by FDA under an Emergency Use Authorization (EUA). This EUA will remain in effect (meaning this test can be used) for the duration of the COVID-19 declaration under Section 564(b)(1) of the Act, 21 U.S.C. section 360bbb-3(b)(1), unless the authorization is terminated or revoked.  Performed at [facility], [place] [address] [place], [place] [address]   Urine culture     Status: Abnormal   Collection Time: 05/12/20  8:45 PM   Specimen: In/Out Cath Urine  Result Value Ref Range Status   Specimen Description   Final    IN/OUT CATH URINE Performed at [facility] [address] [place], [place] [address]    Special Requests   Final    NONE Performed at [facility], [place] [address], [place], [place] [address]    Culture MULTIPLE SPECIES PRESENT, SUGGEST RECOLLECTION (A)  Final   Report Status 05/14/2020 FINAL  Final  MRSA PCR Screening     Status: None   Collection Time: 05/13/20  5:28 AM   Specimen: Nasopharyngeal  Result Value Ref Range Status   MRSA by PCR NEGATIVE NEGATIVE Final    Comment:        The GeneXpert MRSA Assay (FDA approved for NASAL specimens only), is one component of a comprehensive MRSA colonization surveillance program. It is not intended to diagnose MRSA infection nor to guide or monitor treatment for MRSA infections. Performed at [facility], [place] [address] [place], [place] [address]   Body fluid culture w Gram Stain     Status: None   Collection Time: 05/14/20  3:29 PM   Specimen: Joint, Hip; Body Fluid  Result Value Ref Range Status   Specimen Description   Final    HIP RIGHT Performed at [facility] [address], [place], [place] [address]    Special Requests   Final    NONE Performed at [facility], [place] [address], [place], [place]  [address]    Gram Stain   Final    MODERATE WBC PRESENT,BOTH PMN AND MONONUCLEAR NO ORGANISMS SEEN    Culture   Final    NO GROWTH 3 DAYS Performed at [facility], [place] [address], [place], [place] [address]    Report Status 05/18/2020 FINAL  Final    Impression/Plan:  1. Streptococcal Strep G sepsis - now off pressor support and on ceftriaxone and with slow improvement.  [name] [name] will need prolonged antibiotics and improving now.    2.  Left hip fluid collection - ? If infected.  No growth on culture.  Orthopedics to see if  debridement indicated.    3.  HIV - on treatment here with Prezcobix and Tivicay and no issues.  Viral load at his set point consistent with non-compliance prior to hospitalization.    4.  OI risk - on bactrim prophylaxis.

## 2020-05-18 NOTE — Progress Notes (Signed)
NAME:  [name] [name], MRN:  [id number], DOB:  02/09/1960, LOS: 5 ADMISSION DATE:  05/12/2020, CONSULTATION DATE:  5/4 REFERRING MD:  Rancour, CHIEF COMPLAINT:  Found down   History of Present Illness:  60 y/o male admitted from the [facility] ER on 5/4 after being found down.  Noted to have sepsis physiology and a subdural hemorrhage.  NSGY reported to the ER that there was no indication for an intervention (findings concerning for possible embolic ischemic infarcts), TTE negative. R Hip MRI with fluid collection, s/p aspiration per IR.  Blood cultures positive for Group G Strep.    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 CT ABD/Pelvis > no acute intra-abdominal process, evidence of sacral decubitus, slight increase compared to prior but no underlying fluid collection, left hip prosthesis   5/5 CT Head > posterior parafalcine SDH.  Cefepime, vanco, flagyl. Remains confused, GP bacteremia on BCID. Family reported 1 wk hx of GI c/o's, fell landing on right hip, concerned he has been drinking more & difficulties with medication identification.  MRI R Hip > large fluid collection surrounding right hip arthroplasty with minimal peripheral enhancement, fluid extends along the femoral component within the medullary cavity of the proximal femur. Small soft tissue defect along the right aspect of the gluteal cleft, bilateral hydroceles. MRI Brain with multifocal acute to subacute ischemic infarcts involving the bilateral cerebral and cerebellar hemispheres, associated mild scattered petechial hemorrhage - possible central thromboembolic etiology. Small subdural hemorrhage without shift. Chronic small vessel  disease. Limited TTE b/c of patient cooperation, but no evidence of vegetation, GI DD. ABX changed to ceftriaxone.   5/6 Aspiration of R hip. Ortho aware of findings.    5/8 More alert, oriented to person. Remains on levophed gtt.   5/9 Cortisol assessed, 13.  Stress dose steroids initiated.    Interim History / Subjective:  Hip culture pending, no growth to date  Afebrile / WBC 12.2  Remains on levophed 7 mcg  On RA  Glucose range 185-198  I/O 1.9L UOP, +592 ml in last 24 hours   Objective   Blood pressure 120/66, pulse 79, temperature 98 F (36.7 C), temperature source Oral, resp. rate 19, height 6\' 2"  (1.88 m), weight 68.5 kg, SpO2 95 %.        Intake/Output Summary (Last 24 hours) at 05/18/2020 0830 Last data filed at 05/18/2020 0443 Gross per 24 hour  Intake 2065.25 ml  Output 1640 ml  Net 425.25 ml   Filed Weights   05/15/20 0500 05/16/20 0500 05/17/20 0500  Weight: 67.1 kg 69.9 kg 68.5 kg    Examination: General: chronically ill appearing adult male lying in bed in NAD  HEENT: MM pink/moist, poor dentition, anicteric, small bore feeding tube in place Neuro: more alert/awake, speech clear, oriented to self, place / appropriate, MAE CV: s1s2 RRR, no m/r/g PULM:  Non-labored on RA, lungs bilaterally clear  GI: soft, bsx4 active  Extremities: warm/dry, no edema  Skin: sacral wound as below       Labs/imaging that I havepersonally reviewed  (right click and "Reselect all SmartList Selections" daily)  Pressor requirements  Cortisol level  CBC BMP Culture data Glucose trends   Resolved Hospital  Problem list   Hypokalemia    Hypophosphatemia  Assessment & Plan:   Septic Shock due to Strep Bacteremia  Adrenal Insufficiency  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  DDx includes skin/soft tissue source, endocarditis (but not seen on  TTE), & HIV related opportunistic infections.  History of B strep septic hip/psoas abscess.  Current MRI of right hip with large fluid collection. Has also had sacral decubitus for at least 8 years with healing mixed with opening of wound.  -follow R hip aspiration culture -appreciate ID  -wean levophed for MAP>65 -continue stress dos steroids  -abx per ID, ceftriaxone  -will likely need TEE at some point  Fall with head trauma causing SDH Stable on head imaging, by report NSGY says no intervention needed after discussion with ER -improving, follow neuro exam    Thrombocytopenia S/p platelets 5/7 with minimal improvement on 5/8  -slowly improving, follow CBC -no indication for transfusion   Acute Metabolic Encephalopathy Mixed picture, metabolic cause from sepsis but confounded by head injury; doesn't appear to be in alcohol withdrawal but at risk for that; some improvement with fluids, antibiotics but he is far from his baseline (works, drives etc) -minimize all sedating medications  -PT/OT   Sacral wound, chronic, 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 Lymphopenia noted on CBC. CD4 <35 -per ID, continue Tivicay, Prezcobix -Palliative Care consulted 5/9  Steroid Induced Hyperglycemia -add very sensitive SSI -stop D5w in IVF   Moderate Malnutrition  -TF per Nutrition  -free water as above  -follow electrolytes  -allow diet as soon as mental status supports   History of anal cancer, recent biopsy by Dr. Dema Severin negative for malignancy -supportive care  -avoid  flexi-seal   Skin changes feet > Kaposi's?  -per ID    Best practice (right click and "Reselect all SmartList Selections" daily)  Diet:  NPO, TF  Pain/Anxiety/Delirium protocol (if indicated): No VAP protocol (if indicated): Not indicated DVT prophylaxis: SCD GI prophylaxis: N/A Glucose control:  SSI No Central venous access:  N/A Arterial line:  N/A Foley:  N/A Mobility:  bed rest  PT consulted: N/A Last date of multidisciplinary goals of care discussion 5/6 -follow up conversation with sister [name], they have not discussed code status as of yet.  Reviewed reversible / non-reversible illnesses. Discussed his overall state of health and current acute processes.   Code Status:  full code Disposition: ICU  Will update sisters on arrival 5/10.    CC Time: 34 minutes    Noe Gens, MSN, APRN, NP-C, AGACNP-BC [facility] 05/18/2020, 8:30 AM   Please see [email or web address] for pager details.   From 7A-7P if no response, please call [phone] After hours, please call ELink [phone]

## 2020-05-18 NOTE — Consult Note (Signed)
Consultation Note Date: 05/18/2020   Patient Name: [name] [name]  DOB: 21-Feb-1960  MRN: [id number]  Age / Sex: 60 y.o., male  PCP: [name] Riches, MD Referring Physician: Chesley Mires, MD  Reason for Consultation: Establishing goals of care  HPI/Patient Profile: 60 y.o. male  admitted on 05/12/2020    Clinical Assessment and Goals of Care: 61 year old gentleman with history of HIV, admitted with septic shock from right hip septic arthritis with group G streptococcal bacteremia, also found to have possible septic emboli to brain with left subdural hematoma without mass-effect, had a fall prior to this hospitalization.  Hospital course also complicated by presence of chronic sacral pressure wound, thrombocytopenia and possible relative adrenal insufficiency.  Remains in the ICU on critical care service, infectious disease consultants following.  Palliative consult for goals of care discussions has also been requested.  Chart reviewed, patient seen and examined, also discussed with SLP colleague who was at the bedside.  Returned later and also found [name] at the bedside.  Introduced myself and palliative care as follows:  Palliative medicine is specialized medical care for people living with serious illness. It focuses on providing relief from the symptoms and stress of a serious illness. The goal is to improve quality of life for both the patient and the family.  Goals of care: Broad aims of medical therapy in relation to the patient's values and preferences. Our aim is to provide medical care aimed at enabling patients to achieve the goals that matter most to them, given the circumstances of their particular medical situation and their constraints.   Goals wishes and values important to the patient and family as a unit attempted to be explored.  Discussed about scope of current  hospitalization.  Patient's sister had questions about things from an infectious disease standpoint.  Explained to the best of my ability about a viral load and CD4 count and concern for possible opportunistic infections and recurrent treatment plan from infectious disease specialist.  Discussed about goals, currently for full code/full scope.  Palliative to follow, see below.   NEXT OF KIN Sister [name] [name] present at the bedside.  Patient has 2 sisters.  SUMMARY OF RECOMMENDATIONS   Full code/full scope care for now.  Discussed with patient and Sister [name] [name] present at the bedside.  We reviewed scope of current hospitalization.  Continue current measures.  Palliative will follow peripherally and reengage in further goals of care discussions based on patient's hospital course and overall disease trajectory of illness.  Thank you for the consult.  Code Status/Advance Care Planning:  Full code    Symptom Management:      Palliative Prophylaxis:   Delirium Protocol  Additional Recommendations (Limitations, Scope, Preferences):  Full Scope Treatment  Psycho-social/Spiritual:   Desire for further Chaplaincy support:yes  Additional Recommendations: Caregiving  Support/Resources  Prognosis:   Unable to determine  Discharge Planning: To Be Determined      Primary Diagnoses: Present on Admission: . AKI (acute kidney injury) ([facility]) . Human immunodeficiency  virus (HIV) disease ([facility]) . SDH (subdural hematoma) ([facility]) . Hypothermia due to exposure . Cellulitis . Elevated brain natriuretic peptide (BNP) level . Volume overload . Dehydration   I have reviewed the medical record, interviewed the patient and family, and examined the patient. The following aspects are pertinent.  Past Medical History:  Diagnosis Date  . Arthritis   . Decubitus ulcer of sacral area   . DVT (deep venous thrombosis) ([facility]) 2008   Left leg  . Exposure to hepatitis B   . H/O hypotension   . History of  anemia   . History of gout   . History of transfusion   . 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 HIV (human immunodeficiency virus infection) ([facility])   . Hx of sepsis 2015   affected rt hip / femur  . Pain in limb 07/23/2013  . PONV (postoperative nausea and vomiting)    Hiccups for 4 days after anesthesia  . Radiation   . 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 Shingles    left shoulder, right leg   Social History   Socioeconomic History  . Marital status: Single    Spouse name: Not on file  . Number of children: 0  . Years of education: BA  . Highest education level: Not on file  Occupational History  . Occupation: disabled    Employer: [employer]  Tobacco Use  . Smoking status: Current Every Day Smoker    Packs/day: 0.30    Types: Cigarettes  . Smokeless tobacco: Never Used  Vaping Use  . Vaping Use: Never used  Substance and Sexual Activity  . Alcohol use: Yes    Alcohol/week: 3.0 standard drinks    Types: 3 Standard drinks or equivalent per week  . Drug use: Yes    Types: Marijuana    Comment: occ.  Marland Kitchen Sexual activity: Never    Partners: Male    Comment: declined condoms  Other Topics Concern  . Not on file  Social History Narrative  . Not on file   Social Determinants of Health   Financial Resource Strain: Not on file  Food Insecurity: Not on file  Transportation Needs: Not on file  Physical Activity: Not on file  Stress: Not on file  Social Connections: Not on file   Family History  Problem Relation Age of Onset  . Diabetes Mother   . Dementia Mother   . Atrial fibrillation Mother   . Hypertension Mother   . Cancer Father   . Emphysema Father    Scheduled Meds: . chlorhexidine  15 mL Mouth Rinse BID  . Chlorhexidine Gluconate Cloth  6 each Topical Daily  . collagenase   Topical Daily  . darunavir-cobicistat  1 tablet Oral Daily  . dolutegravir  50 mg Per Tube Daily  . feeding supplement (PROSource TF)  45 mL Per Tube BID  .  free water  200 mL Per Tube Q4H  . hydrocortisone sod succinate (SOLU-CORTEF) inj  50 mg Intravenous Q6H  . insulin aspart  0-6 Units Subcutaneous Q4H  . mouth rinse  15 mL Mouth Rinse q12n4p  . nutrition supplement (JUVEN)  1 packet Per Tube BID BM  . pantoprazole sodium  40 mg Per Tube Q24H  . sulfamethoxazole-trimethoprim  1 tablet Per Tube Daily   Continuous Infusions: . cefTRIAXone (ROCEPHIN)  IV Stopped (05/18/20 1142)  . feeding supplement (VITAL 1.5 CAL) 1,000 mL (05/18/20 0530)  .  lactated ringers 10 mL/hr at 05/18/20 1015  . norepinephrine (LEVOPHED) Adult infusion 5 mcg/min (05/18/20 0857)   PRN Meds:.docusate sodium, food thickener, ondansetron (ZOFRAN) IV, oxyCODONE, polyethylene glycol Medications Prior to Admission:  Prior to Admission medications   Medication Sig Start Date End Date Taking? Authorizing Provider  chlorproMAZINE (THORAZINE) 25 MG tablet Take 25 mg by mouth 3 (three) times daily.   Yes [provider]  darunavir-cobicistat (PREZCOBIX) 800-150 MG tablet TAKE ONE TABLET BY MOUTH DAILY . SWALLOW WHOLE, DO NOT CRUSH, BREAK, OR CHEW TABLETS. TAKE WITH FOOD Patient taking differently: Take 1 tablet by mouth daily. SWALLOW WHOLE, DO NOT CRUSH, BREAK, OR CHEW TABLETS. TAKE WITH FOOD 01/01/20  Yes [name] Riches, MD  rilpivirine (EDURANT) 25 MG TABS tablet TAKE ONE TABLET (25 MG TOTAL) BY MOUTH DAILY WITH BREAKFAST Patient taking differently: Take 25 mg by mouth daily with breakfast. 01/01/20  Yes Hatcher, Doroteo Bradford, MD  TIVICAY 50 MG tablet TAKE ONE TABLET (50 MG TOTAL) BY MOUTH DAILY Patient taking differently: Take 50 mg by mouth daily. 01/01/20  Yes [name] Riches, MD  traMADol (ULTRAM) 50 MG tablet Take 50 mg by mouth every 6 (six) hours as needed for severe pain.   Yes [provider]  vitamin C (ASCORBIC ACID) 500 MG tablet Take 1,000 mg by mouth daily.    Yes [provider]   Allergies  Allergen Reactions  . Collagen Rash     redness   Review of Systems Complains of generalized weakness Physical Exam Awake alert follows commands, answers a few questions appropriately has NG tube Diminished breath sounds S1-S2 On pressors and tube feeds Abdomen is not distended Generalized weakness  Vital Signs: BP 120/66   Pulse 79   Temp 97.9 F (36.6 C) (Axillary)   Resp 19   Ht 6\' 2"  (1.88 m)   Wt 68.5 kg   SpO2 95%   BMI 19.39 kg/m  Pain Scale: 0-10   Pain Score: 0-No pain   SpO2: SpO2: 95 % O2 Device:SpO2: 95 % O2 Flow Rate: .O2 Flow Rate (L/min): 15 L/min  IO: Intake/output summary:   Intake/Output Summary (Last 24 hours) at 05/18/2020 1439 Last data filed at 05/18/2020 0900 Gross per 24 hour  Intake 1598.07 ml  Output 2540 ml  Net -941.93 ml    LBM: Last BM Date: 05/18/20 Baseline Weight: Weight: 64 kg Most recent weight: Weight: 68.5 kg     Palliative Assessment/Data:   PPS 40%  Time In:  1330 Time Out:  1430  Time Total:  60  Greater than 50%  of this time was spent counseling and coordinating care related to the above assessment and plan.  Signed by: Loistine Chance, MD   Please contact Palliative Medicine Team phone at [phone] for questions and concerns.  For individual provider: See Shea Evans

## 2020-05-18 NOTE — Progress Notes (Signed)
Family updated on plan of care at bedside.  Reviewed labs, medications and rehab efforts.  In addition, we discussed concept of living will, HCOPA, DPOA and chronic illness in relation to CPR.  Support offered.  Will follow.   Time spent with family - 66 minutes    Noe Gens, MSN, APRN, NP-C, AGACNP-BC [facility] 05/18/2020, 6:38 PM   Please see [email or web address] for pager details.   From 7A-7P if no response, please call [phone] After hours, please call ELink [phone]

## 2020-05-19 ENCOUNTER — Inpatient Hospital Stay (HOSPITAL_COMMUNITY): Payer: Medicare Other

## 2020-05-19 DIAGNOSIS — M25551 Pain in right hip: Secondary | ICD-10-CM

## 2020-05-19 DIAGNOSIS — Z96641 Presence of right artificial hip joint: Secondary | ICD-10-CM

## 2020-05-19 DIAGNOSIS — N179 Acute kidney failure, unspecified: Secondary | ICD-10-CM | POA: Diagnosis not present

## 2020-05-19 DIAGNOSIS — B20 Human immunodeficiency virus [HIV] disease: Secondary | ICD-10-CM | POA: Diagnosis not present

## 2020-05-19 DIAGNOSIS — G8929 Other chronic pain: Secondary | ICD-10-CM

## 2020-05-19 DIAGNOSIS — A419 Sepsis, unspecified organism: Secondary | ICD-10-CM | POA: Diagnosis not present

## 2020-05-19 DIAGNOSIS — R7881 Bacteremia: Secondary | ICD-10-CM | POA: Diagnosis not present

## 2020-05-19 DIAGNOSIS — R6521 Severe sepsis with septic shock: Secondary | ICD-10-CM | POA: Diagnosis not present

## 2020-05-19 LAB — GLUCOSE, CAPILLARY
Glucose-Capillary: 152 mg/dL — ABNORMAL HIGH (ref 70–99)
Glucose-Capillary: 104 mg/dL — ABNORMAL HIGH (ref 70–99)
Glucose-Capillary: 114 mg/dL — ABNORMAL HIGH (ref 70–99)
Glucose-Capillary: 95 mg/dL (ref 70–99)

## 2020-05-19 LAB — CBC
HCT: 21.7 % — ABNORMAL LOW (ref 39.0–52.0)
Hemoglobin: 7.2 g/dL — ABNORMAL LOW (ref 13.0–17.0)
MCH: 36.4 pg — ABNORMAL HIGH (ref 26.0–34.0)
MCHC: 33.2 g/dL (ref 30.0–36.0)
MCV: 109.6 fL — ABNORMAL HIGH (ref 80.0–100.0)
Platelets: 95 K/uL — ABNORMAL LOW (ref 150–400)
RBC: 1.98 MIL/uL — ABNORMAL LOW (ref 4.22–5.81)
RDW: 17.2 % — ABNORMAL HIGH (ref 11.5–15.5)
WBC: 13.6 K/uL — ABNORMAL HIGH (ref 4.0–10.5)
nRBC: 0.2 % (ref 0.0–0.2)

## 2020-05-19 LAB — BASIC METABOLIC PANEL
Anion gap: 6 (ref 5–15)
BUN: 51 mg/dL — ABNORMAL HIGH (ref 6–20)
CO2: 22 mmol/L (ref 22–32)
Calcium: 7.9 mg/dL — ABNORMAL LOW (ref 8.9–10.3)
Chloride: 112 mmol/L — ABNORMAL HIGH (ref 98–111)
Creatinine, Ser: 1.31 mg/dL — ABNORMAL HIGH (ref 0.61–1.24)
GFR, Estimated: >60 mL/min (ref >60)
Glucose, Bld: 184 mg/dL — ABNORMAL HIGH (ref 70–99)
Potassium: 4.4 mmol/L (ref 3.5–5.1)
Sodium: 140 mmol/L (ref 135–145)

## 2020-05-19 LAB — PREPARE RBC (CROSSMATCH)

## 2020-05-19 LAB — HEMOGLOBIN AND HEMATOCRIT, BLOOD
HCT: 23.6 % — ABNORMAL LOW (ref 39.0–52.0)
Hemoglobin: 7.6 g/dL — ABNORMAL LOW (ref 13.0–17.0)

## 2020-05-19 MED ORDER — INSULIN ASPART 100 UNIT/ML IJ SOLN: 0.0000 [IU] | Freq: Every day | INTRAMUSCULAR | Status: DC

## 2020-05-19 MED ORDER — PANTOPRAZOLE SODIUM 40 MG PO TBEC
40.0000 mg | DELAYED_RELEASE_TABLET | Freq: Every day | ORAL | Status: DC
  Administered 2020-05-19 – 2020-05-20 (×2): 40 mg via ORAL
  Filled 2020-05-19 (×2): qty 1

## 2020-05-19 MED ORDER — INSULIN ASPART 100 UNIT/ML IJ SOLN
0.0000 [IU] | Freq: Three times a day (TID) | INTRAMUSCULAR | Status: DC
  Administered 2020-05-20: 1 [IU] via SUBCUTANEOUS
  Administered 2020-05-20 – 2020-05-22 (×2): 2 [IU] via SUBCUTANEOUS
  Administered 2020-05-22 – 2020-05-26 (×3): 1 [IU] via SUBCUTANEOUS

## 2020-05-19 MED ORDER — OXYCODONE HCL 5 MG/5ML PO SOLN
5.0000 mg | ORAL | Status: DC | PRN
  Administered 2020-05-20 – 2020-05-27 (×7): 5 mg via ORAL
  Filled 2020-05-19 (×7): qty 5

## 2020-05-19 MED ORDER — SODIUM CHLORIDE 0.9% IV SOLUTION: Freq: Once | INTRAVENOUS | Status: DC

## 2020-05-19 MED ORDER — SULFAMETHOXAZOLE-TRIMETHOPRIM 800-160 MG PO TABS
1.0000 | ORAL_TABLET | Freq: Every day | ORAL | Status: DC
  Administered 2020-05-20 – 2020-05-28 (×9): 1 via ORAL
  Filled 2020-05-19 (×9): qty 1

## 2020-05-19 NOTE — Progress Notes (Signed)
[facility] for Infectious Disease   Reason for visit: Follow up on Streptococcal bacteremia  Interval History: pulled out his NG tube today though did get his ARVs first today.  Swallow eval noted and on a D3 diet.  Remains afebrile, WBC 13.6.   Day 7 ceftriaxone Day 8 total antibiotics  Physical Exam: Constitutional:  Vitals:   05/19/20 1507 05/19/20 1524  BP: (!) 98/57 (!) 102/55  Pulse: 75 70  Resp: 17 17  Temp: 97.6 F (36.4 C) (!) 97 F (36.1 C)  SpO2: 95%    patient appears in NAD Respiratory: Normal respiratory effort; CTA B Cardiovascular: RRR GI: soft, nt, nd  Review of Systems: Constitutional: negative for fevers and chills Gastrointestinal: negative for diarrhea  Lab Results  Component Value Date   WBC 13.6 (H) 05/19/2020   HGB 7.2 (L) 05/19/2020   HCT 21.7 (L) 05/19/2020   MCV 109.6 (H) 05/19/2020   PLT 95 (L) 05/19/2020    Lab Results  Component Value Date   CREATININE 1.31 (H) 05/19/2020   BUN 51 (H) 05/19/2020   NA 140 05/19/2020   K 4.4 05/19/2020   CL 112 (H) 05/19/2020   CO2 22 05/19/2020    Lab Results  Component Value Date   ALT 32 05/15/2020   AST 46 (H) 05/15/2020   ALKPHOS 60 05/15/2020     Microbiology: Recent Results (from the past 240 hour(s))  Blood Culture (routine x 2)     Status: Abnormal   Collection Time: 05/12/20  6:45 PM   Specimen: BLOOD  Result Value Ref Range Status   Specimen Description   Final    BLOOD RIGHT WRIST Performed at [facility], [place] [address] [place], [place] [address]    Special Requests   Final    BOTTLES DRAWN AEROBIC AND ANAEROBIC Blood Culture adequate volume   Culture  Setup Time   Final    GRAM POSITIVE COCCI IN BOTH AEROBIC AND ANAEROBIC BOTTLES CRITICAL RESULT CALLED TO, READ BACK BY AND VERIFIED WITH: PHARM D L.Wynona Canes ON [id number] AT 0850 BY E.PARRISH Performed at [facility], [place] [address] [place], [place] [address]    Culture STREPTOCOCCUS  GROUP G (A)  Final   Report Status 05/17/2020 FINAL  Final   Organism ID, Bacteria STREPTOCOCCUS GROUP G  Final      Susceptibility   Streptococcus group g - MIC*    CLINDAMYCIN <=0.25 SENSITIVE Sensitive     ERYTHROMYCIN <=0.12 SENSITIVE Sensitive     VANCOMYCIN 0.25 SENSITIVE Sensitive     CEFTRIAXONE <=0.12 SENSITIVE Sensitive     LEVOFLOXACIN 1 SENSITIVE Sensitive     * STREPTOCOCCUS GROUP G  Blood Culture ID Panel (Reflexed)     Status: Abnormal   Collection Time: 05/12/20  6:45 PM  Result Value Ref Range Status   Enterococcus faecalis NOT DETECTED NOT DETECTED Final   Enterococcus Faecium NOT DETECTED NOT DETECTED Final   Listeria monocytogenes NOT DETECTED NOT DETECTED Final   Staphylococcus species NOT DETECTED NOT DETECTED Final   Staphylococcus aureus (BCID) NOT DETECTED NOT DETECTED Final   Staphylococcus epidermidis NOT DETECTED NOT DETECTED Final   Staphylococcus lugdunensis NOT DETECTED NOT DETECTED Final   Streptococcus species DETECTED (A) NOT DETECTED Final    Comment: Not Enterococcus species, Streptococcus agalactiae, Streptococcus pyogenes, or Streptococcus pneumoniae. CRITICAL RESULT CALLED TO, READ BACK BY AND VERIFIED WITH: PHARM D L.POINDEXTER ON [phone] AT 0850 BY E.PARRISH    Streptococcus agalactiae  NOT DETECTED NOT DETECTED Final   Streptococcus pneumoniae NOT DETECTED NOT DETECTED Final   Streptococcus pyogenes NOT DETECTED NOT DETECTED Final   A.calcoaceticus-baumannii NOT DETECTED NOT DETECTED Final   Bacteroides fragilis NOT DETECTED NOT DETECTED Final   Enterobacterales NOT DETECTED NOT DETECTED Final   Enterobacter cloacae complex NOT DETECTED NOT DETECTED Final   Escherichia coli NOT DETECTED NOT DETECTED Final   Klebsiella aerogenes NOT DETECTED NOT DETECTED Final   Klebsiella oxytoca NOT DETECTED NOT DETECTED Final   Klebsiella pneumoniae NOT DETECTED NOT DETECTED Final   Proteus species NOT DETECTED NOT DETECTED Final   Salmonella species  NOT DETECTED NOT DETECTED Final   Serratia marcescens NOT DETECTED NOT DETECTED Final   Haemophilus influenzae NOT DETECTED NOT DETECTED Final   Neisseria meningitidis NOT DETECTED NOT DETECTED Final   Pseudomonas aeruginosa NOT DETECTED NOT DETECTED Final   Stenotrophomonas maltophilia NOT DETECTED NOT DETECTED Final   Candida albicans NOT DETECTED NOT DETECTED Final   Candida auris NOT DETECTED NOT DETECTED Final   Candida glabrata NOT DETECTED NOT DETECTED Final   Candida krusei NOT DETECTED NOT DETECTED Final   Candida parapsilosis NOT DETECTED NOT DETECTED Final   Candida tropicalis NOT DETECTED NOT DETECTED Final   Cryptococcus neoformans/gattii NOT DETECTED NOT DETECTED Final    Comment: Performed at [facility], [address], [place], [place] [address]  Blood Culture (routine x 2)     Status: Abnormal   Collection Time: 05/12/20  6:57 PM   Specimen: BLOOD  Result Value Ref Range Status   Specimen Description   Final    BLOOD LEFT ANTECUBITAL Performed at [facility] [address], [place], [place] [address]    Special Requests   Final    BOTTLES DRAWN AEROBIC AND ANAEROBIC Blood Culture adequate volume   Culture  Setup Time   Final    GRAM POSITIVE COCCI CRITICAL VALUE NOTED.  VALUE IS CONSISTENT WITH PREVIOUSLY REPORTED AND CALLED VALUE. IN BOTH AEROBIC AND ANAEROBIC BOTTLES    Culture (A)  Final    STREPTOCOCCUS GROUP G SUSCEPTIBILITIES PERFORMED ON PREVIOUS CULTURE WITHIN THE LAST 5 DAYS. Performed at [facility], [place] [address], [place], [place] [address]    Report Status 05/17/2020 FINAL  Final  Resp Panel by RT-PCR (Flu A&B, Covid) Nasopharyngeal Swab     Status: None   Collection Time: 05/12/20  7:00 PM   Specimen: Nasopharyngeal Swab; Nasopharyngeal(NP) swabs in vial transport medium  Result Value Ref Range Status   SARS Coronavirus 2 by RT PCR NEGATIVE NEGATIVE Final    Comment: (NOTE) SARS-CoV-2 target nucleic acids are  NOT DETECTED.  The SARS-CoV-2 RNA is generally detectable in upper respiratory specimens during the acute phase of infection. The lowest concentration of SARS-CoV-2 viral copies this assay can detect is 138 copies/mL. A negative result does not preclude SARS-Cov-2 infection and should not be used as the sole basis for treatment or other patient management decisions. A negative result may occur with  improper specimen collection/handling, submission of specimen other than nasopharyngeal swab, presence of viral mutation(s) within the areas targeted by this assay, and inadequate number of viral copies(<138 copies/mL). A negative result must be combined with clinical observations, patient history, and epidemiological information. The expected result is Negative.  Fact Sheet for Patients:  EntrepreneurPulse.com.au  Fact Sheet for Healthcare Providers:  IncredibleEmployment.be  This test is no t yet approved or cleared by the [place] FDA and  has  been authorized for detection and/or diagnosis of SARS-CoV-2 by FDA under an Emergency Use Authorization (EUA). This EUA will remain  in effect (meaning this test can be used) for the duration of the COVID-19 declaration under Section 564(b)(1) of the Act, 21 U.S.C.section 360bbb-3(b)(1), unless the authorization is terminated  or revoked sooner.       Influenza A by PCR NEGATIVE NEGATIVE Final   Influenza B by PCR NEGATIVE NEGATIVE Final    Comment: (NOTE) The Xpert Xpress SARS-CoV-2/FLU/RSV plus assay is intended as an aid in the diagnosis of influenza from Nasopharyngeal swab specimens and should not be used as a sole basis for treatment. Nasal washings and aspirates are unacceptable for Xpert Xpress SARS-CoV-2/FLU/RSV testing.  Fact Sheet for Patients: EntrepreneurPulse.com.au  Fact Sheet for Healthcare Providers: IncredibleEmployment.be  This test is not  yet approved or cleared by the [place] FDA and has been authorized for detection and/or diagnosis of SARS-CoV-2 by FDA under an Emergency Use Authorization (EUA). This EUA will remain in effect (meaning this test can be used) for the duration of the COVID-19 declaration under Section 564(b)(1) of the Act, 21 U.S.C. section 360bbb-3(b)(1), unless the authorization is terminated or revoked.  Performed at [facility], [place] [address] [place], [place] [address]   Urine culture     Status: Abnormal   Collection Time: 05/12/20  8:45 PM   Specimen: In/Out Cath Urine  Result Value Ref Range Status   Specimen Description   Final    IN/OUT CATH URINE Performed at [facility] [address], [place], [place] [address]    Special Requests   Final    NONE Performed at [facility], [place] [address], [place], [place] [address]    Culture MULTIPLE SPECIES PRESENT, SUGGEST RECOLLECTION (A)  Final   Report Status 05/14/2020 FINAL  Final  MRSA PCR Screening     Status: None   Collection Time: 05/13/20  5:28 AM   Specimen: Nasopharyngeal  Result Value Ref Range Status   MRSA by PCR NEGATIVE NEGATIVE Final    Comment:        The GeneXpert MRSA Assay (FDA approved for NASAL specimens only), is one component of a comprehensive MRSA colonization surveillance program. It is not intended to diagnose MRSA infection nor to guide or monitor treatment for MRSA infections. Performed at [facility], [place] [address] [place], [place] [address]   Body fluid culture w Gram Stain     Status: None   Collection Time: 05/14/20  3:29 PM   Specimen: Joint, Hip; Body Fluid  Result Value Ref Range Status   Specimen Description   Final    HIP RIGHT Performed at [facility] [address] [place], [place] [address]    Special Requests   Final    NONE Performed at [facility], [place]  [address], [place], [place] [address]    Gram Stain   Final    MODERATE WBC PRESENT,BOTH PMN AND MONONUCLEAR NO ORGANISMS SEEN    Culture   Final    NO GROWTH 3 DAYS Performed at [facility], [place] [address], [place], [place] [address]    Report Status 05/18/2020 FINAL  Final  Culture, blood (Routine X 2) w Reflex to ID Panel     Status: None (Preliminary result)   Collection Time: 05/18/20 10:36 AM   Specimen: BLOOD LEFT HAND  Result Value Ref Range Status   Specimen  Description   Final    BLOOD LEFT HAND Performed at [facility] [address] [place], [place] [address]    Special Requests   Final    BOTTLES DRAWN AEROBIC ONLY Blood Culture adequate volume Performed at [facility] [address], [place], [place] [address]    Culture   Final    NO GROWTH < 24 HOURS Performed at [facility] [address], [place], [place] [address]    Report Status PENDING  Incomplete  Culture, blood (Routine X 2) w Reflex to ID Panel     Status: None (Preliminary result)   Collection Time: 05/18/20 10:36 AM   Specimen: BLOOD LEFT HAND  Result Value Ref Range Status   Specimen Description   Final    BLOOD LEFT HAND Performed at [facility] [address] [place], [place] [address]    Special Requests   Final    BOTTLES DRAWN AEROBIC ONLY Blood Culture results may not be optimal due to an inadequate volume of blood received in culture bottles Performed at [facility] [address], [place], [place] [address]    Culture   Final    NO GROWTH < 24 HOURS Performed at [facility] [address], [place],  [address]    Report Status PENDING  Incomplete    Impression/Plan:  1. Right hip fluid collection - minimal peripheral enhancement and negative culture from aspiration though moderate WBCs noted.  On ceftriaxone for Streptococcal G and has a history of group B Strep.  Will  treat for a prolonged course and no changes.  Followed by Dr. [name].   2.  Septic emboli to brain - as above, Strep G and on treatment with high dose ceftriaxone twice daily.  Will need a prolonged course at least 6 weeks through June 20th.    3.  HIV - on Prezcobix and Tivicay due since it can be crushed.  Recent viral load of 866,000 and CD4 < 35, most c/w being off treatment prior to admission.   Has been getting via NGT but now has been pulled out.  On a dysphagia diet but reportedly unable to swallow pills.   Discussed with CCM.

## 2020-05-19 NOTE — TOC Progression Note (Signed)
Transition of Care [facility]) - Progression Note    Patient Details  Name: [name] [name] MRN: [id number] Date of Birth: May 06, 1960  Transition of Care [facility]) CM/SW Contact  Leeroy Cha, RN Phone Number: 05/19/2020, 9:25 AM  Clinical Narrative:    60 y/o male admitted from the [facility] ER on 5/4 after being found down.  Noted to have sepsis physiology and a subdural hemorrhage.  NSGY reported to the ER that there was no indication for an intervention (findings concerning for possible embolic ischemic infarcts), TTE negative. R Hip MRI with fluid collection, s/p aspiration per IR.  Blood cultures positive for Group G Strep.    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 CT ABD/Pelvis > no acute intra-abdominal process,  evidence of sacral decubitus, slight increase compared to prior but no underlying fluid collection, left hip prosthesis   5/5 CT Head > posterior parafalcine SDH.  Cefepime, vanco, flagyl. Remains confused, GP bacteremia on BCID. Family reported 1 wk hx of GI c/o's, fell landing on right hip, concerned he has been drinking more & difficulties with medication identification.  MRI R Hip > large fluid collection surrounding right hip arthroplasty with minimal peripheral enhancement, fluid extends along the femoral component within the medullary cavity of the proximal femur. Small soft tissue defect along the right aspect of the gluteal cleft, bilateral hydroceles. MRI Brain with multifocal acute to subacute ischemic infarcts involving the bilateral cerebral and cerebellar hemispheres, associated mild scattered petechial hemorrhage - possible central thromboembolic etiology. Small  subdural hemorrhage without shift. Chronic small vessel disease. Limited TTE b/c of patient cooperation, but no evidence of vegetation, GI DD. ABX changed to ceftriaxone.   5/6 Aspiration of R hip. Ortho aware of findings.    5/8 More alert, oriented to person. Remains on levophed gtt.   5/9 Cortisol assessed, 13.  Stress dose steroids initiated.   5/10 Remains on 58mcg levo, mental status significantly improved.  SSI added with steroids.  PLAN: following for progression    Expected Discharge Plan: Home/Self Care Barriers to Discharge: Continued Medical Work up  Expected Discharge Plan and Services Expected Discharge Plan: Home/Self Care   Discharge Planning Services: CM Consult   Living arrangements for the past 2 months: Apartment                                       Social Determinants of Health (SDOH) Interventions    Readmission Risk Interventions No flowsheet data found.

## 2020-05-19 NOTE — Progress Notes (Signed)
  Speech Language Pathology Treatment: Dysphagia  Patient Details Name: [name] [name] MRN: [id number] DOB: 1960-08-31 Today's Date: 05/19/2020 Time: 8088-1103 SLP Time Calculation (min) (ACUTE ONLY): 35 min  Assessment / Plan / Recommendation Clinical Impression  Pt in bed, just received wound care and cleaning by RN/nurse technician.  Responsive to SLP with improved efficiency compared to yesterday.  Provided oral care using oral suction and mouth kit.    Pt demonstrates baseline throat clearing that continues with po intake but no overt indication of aspiration.  Suspect potential secretion retention in pharynx given NPO status and h/o GERD, as source of throat clearing.    Swallow initiation delayed up to 5 seconds with liquids and wincing with swallow - but pt denies pain with swallowing.  Pt assisted with hand over hand to hold cup for self feeding using straw to facilitate automaticity of swallowing.  No vital changes with po intake - RR 16-18, Os sats 98-99%.   SLP provided pt with approximately 15 boluses provided - with throat clearing post-swallow of 4 boluses.  Minimally wet voice noted x1 - clearing with cued cough.    His swallow initiation delay coupled with weak cough will incr his asp pna risk. However recommend to start dys3/thin diet with medication with puree and strict precaution OR MBS to definitively rule out pharyngeal dysphagia.  Messaged NP for CCM to determine plan of care. Will follow up closely for dietary tolerance, indication for MBS *if not ordered now* and to help maximize nutrition.      HPI HPI: adm w/AMS d/t sepsis- fall possibly after ETOH consumption, down for 2 days , found to have a small subdural hemorrhage, bilateral cerebral and cerebellar hemispheres events. Pt also with hypothermal upon admit - bear hugger, has sacral wound with h/o Rectal ca s/p radiation, HIV.  He has Dohoff tube in place and has demonstrated improved mentation thus for swallow eval  ordered,  Lungs were clear at admit, now with rhonchi, CT neck Prominent spondylosis at C5-6 and C6-7. Mild diffuse facet hypertrophy.      SLP Plan  Continue with current plan of care;MBS (mbs vs po diet)       Recommendations  Diet recommendations: Dysphagia 3 (mechanical soft);Thin liquid (vs MBS to rule out pharyngeal dysphagia) Medication Administration: Whole meds with puree (or crushed with puree) Compensations: Slow rate;Small sips/bites;Other (Comment) (allow time for pt to conduct dry swallow) Postural Changes and/or Swallow Maneuvers: Seated upright 90 degrees;Upright 30-60 min after meal                Oral Care Recommendations: Oral care QID Follow up Recommendations: Skilled Nursing facility SLP Visit Diagnosis: Dysphagia, unspecified (R13.10) Plan: Continue with current plan of care;MBS (mbs vs po diet)       GO                [name] [name] 05/19/2020, 11:30 AM Kathleen Lime, MS [place] Office [phone] Pager [phone]

## 2020-05-19 NOTE — Progress Notes (Signed)
OT Cancellation Note  Patient Details Name: [name] [name] MRN: [id number] DOB: September 07, 1960   Cancelled Treatment:    Reason Eval/Treat Not Completed: Patient declined, no reason specified. Patient kept stating "not today" when offered to reposition to make more comfortable/participate with therapy. Will re-attempt 5/12 as schedule permits.  Delbert Phenix OT OT pager: [place] 05/19/2020, 2:40 PM

## 2020-05-19 NOTE — Progress Notes (Signed)
PT Cancellation Note  Patient Details Name: [name] [name] MRN: [id number] DOB: [birth year]   Cancelled Treatment:    Reason Eval/Treat Not Completed: Medical issues which prohibited therapy , Patient reports he's had a rough day. Will check back tomorrow.  [name] 05/19/2020, 4:05 PM  [place] Pager [phone] Office [phone]

## 2020-05-19 NOTE — Progress Notes (Signed)
NAME:  [name] [name], MRN:  [id number], DOB:  December 31, 1960, LOS: 6 ADMISSION DATE:  05/12/2020, CONSULTATION DATE:  5/4 REFERRING MD:  [place], CHIEF COMPLAINT:  Found down   History of Present Illness:  60 y/o male admitted from the [facility] ER on 5/4 after being found down.  Noted to have sepsis physiology and a subdural hemorrhage.  NSGY reported to the ER that there was no indication for an intervention (findings concerning for possible embolic ischemic infarcts), TTE negative. R Hip MRI with fluid collection, s/p aspiration per IR.  Blood cultures positive for Group G Strep.    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 CT ABD/Pelvis > no acute intra-abdominal process,  evidence of sacral decubitus, slight increase compared to prior but no underlying fluid collection, left hip prosthesis   5/5 CT Head > posterior parafalcine SDH.  Cefepime, vanco, flagyl. Remains confused, GP bacteremia on BCID. Family reported 1 wk hx of GI c/o's, fell landing on right hip, concerned he has been drinking more & difficulties with medication identification.  MRI R Hip > large fluid collection surrounding right hip arthroplasty with minimal peripheral enhancement, fluid extends along the femoral component within the medullary cavity of the proximal femur. Small soft tissue defect along the right aspect of the gluteal cleft, bilateral hydroceles. MRI Brain with multifocal acute to subacute ischemic infarcts involving the bilateral cerebral and cerebellar hemispheres, associated mild scattered petechial hemorrhage - possible central thromboembolic etiology. Small subdural hemorrhage without shift. Chronic small vessel  disease. Limited TTE b/c of patient cooperation, but no evidence of vegetation, GI DD. ABX changed to ceftriaxone.   5/6 Aspiration of R hip. Ortho aware of findings.    5/8 More alert, oriented to person. Remains on levophed gtt.   5/9 Cortisol assessed, 13.  Stress dose steroids initiated.   5/10 Remains on 64mcg levo, mental status significantly improved.  SSI added with steroids.    Interim History / Subjective:  Hip culture negative Repeat blood cultures pending  Afebrile I/O 1.1L UOP, 1.2L stool, 2L+ in last 24 hours Glucose range 152-184  Objective   Blood pressure 133/64, pulse 60, temperature 98.4 F (36.9 C), temperature source Axillary, resp. rate 15, height 6\' 2"  (1.88 m), weight 73 kg, SpO2 97 %.        Intake/Output Summary (Last 24 hours) at 05/19/2020 0731 Last data filed at 05/19/2020 0600 Gross per 24 hour  Intake 4386.55 ml  Output 2300 ml  Net 2086.55 ml   Filed Weights   05/16/20 0500 05/17/20 0500 05/19/20 0500  Weight: 69.9 kg 68.5 kg 73 kg    Examination: General: chronically ill appearing adult male lying in bed in NAD HEENT: MM pink/moist, poor dentition, anicteric Neuro: Awake, alert / appropriate.  Oriented to self / place, thinks Regan is president, MAE / generalized weakness  CV: s1s2 RRR, no m/r/g PULM: non-labored on RA, lungs bilaterally clear  GI: soft, bsx4 active  Extremities: warm/dry, no edema  Skin: sacral ulcer dressed   Labs/imaging that I havepersonally reviewed  (right click and "Reselect all SmartList Selections" daily)  Vasopressor requirements  BMP - calcium corrects to normal, hyperglycemia  CBC - improved platelets, macrocytosis  Culture  data  Glucose control  Pelvic XRAY  Resolved Hospital Problem list   Hypokalemia    Hypophosphatemia  Assessment & Plan:   Septic Shock due to Strep Bacteremia  Adrenal Insufficiency  Concern for right prosthetic hip infection given recent injury (rolled out of bed onto hip)  and pain with history of infection (strep s/p washouts) there in the past.  DDx includes skin/soft tissue source, endocarditis (but not seen on TTE), & HIV related opportunistic infections.  History of B strep septic hip/psoas abscess.  Current MRI of right hip with large fluid collection. Has also had sacral decubitus for at least 8 years with healing mixed with opening of wound. R hip aspiration culture negative (note 31k WBC). -appreciate ID assistance with patient care  -continue ceftriaxone  -change levophed goal, wean to off for SBP >90 -pending pelvic XRAY > per Ortho  -pending repeat blood cultures, may need TEE -continue stress dose steroids  Fall with head trauma causing SDH Stable on head imaging, by report NSGY says no intervention needed after discussion with ER -improving neuro status, follow clinical exam  -PT efforts / mobilize    Thrombocytopenia S/p platelets 5/7 with minimal improvement on 5/8  -trend CBC -monitor for evidence of bleeding   Acute Metabolic Encephalopathy Mixed picture, metabolic cause from sepsis but confounded by head injury; doesn't appear to be in alcohol withdrawal but at risk for that; some improvement with fluids, antibiotics but he is far from his baseline (works, drives etc) -improving clinical exam  -minimize all sedating meds -no evidence of ETOH withdrawal  -PT as above   Sacral wound, chronic, present on admission Present for at least 8 years -WOC rec's for santyl  -frequent turns   Hypernatremia   AKI   -LR at [facility]  -continue free water PT  -Trend BMP / urinary output -Replace electrolytes as indicated -Avoid nephrotoxic agents, ensure adequate renal perfusion  HIV Lymphopenia noted on CBC on admit. CD4 <35 -defer to ID -continue Prezcobix + Tivicay  -appreciate Palliative Care evaluation   Steroid Induced Hyperglycemia -SSI, very sensitive   Moderate Malnutrition  -TF per Nutrition  -pt pulled NGT, will ask SLP to  review for swallowing 5/11 before placing feeding tube again  -diet / nutrition as able   History of anal cancer, recent biopsy by Dr. Dema Severin negative for malignancy -supportive care   Skin changes feet > Kaposi's?  -per ID  Best practice (right click and "Reselect all SmartList Selections" daily)  Diet:  NPO, re-eval with SLP 5/11   Pain/Anxiety/Delirium protocol (if indicated): No VAP protocol (if indicated): Not indicated DVT prophylaxis: SCD GI prophylaxis: N/A Glucose control:  SSI No Central venous access:  N/A Arterial line:  N/A Foley:  Yes, and it is still needed Mobility:  bed rest  PT consulted: N/A Last date of multidisciplinary goals of care discussion 5/6 - follow up conversation with sister [name], they have not discussed code status as of yet.  Reviewed reversible / non-reversible illnesses. Discussed his overall state of health and current acute processes.   Code Status:  full code Disposition: ICU  Sisters updated in detail on 5/10.  Will update on arrival 5/11.     CC Time: 25 minutes    Noe Gens, MSN, APRN, NP-C, AGACNP-BC [facility] 05/19/2020, 7:31 AM   Please see [email or web address] for pager details.   From 7A-7P if no response, please call [phone] After hours, please call ELink [phone]

## 2020-05-20 DIAGNOSIS — A419 Sepsis, unspecified organism: Secondary | ICD-10-CM | POA: Diagnosis not present

## 2020-05-20 DIAGNOSIS — R7881 Bacteremia: Secondary | ICD-10-CM | POA: Diagnosis not present

## 2020-05-20 DIAGNOSIS — R6521 Severe sepsis with septic shock: Secondary | ICD-10-CM | POA: Diagnosis not present

## 2020-05-20 LAB — CBC
HCT: 24.4 % — ABNORMAL LOW (ref 39.0–52.0)
Hemoglobin: 7.8 g/dL — ABNORMAL LOW (ref 13.0–17.0)
MCH: 34.2 pg — ABNORMAL HIGH (ref 26.0–34.0)
MCHC: 32 g/dL (ref 30.0–36.0)
MCV: 107 fL — ABNORMAL HIGH (ref 80.0–100.0)
Platelets: 102 10*3/uL — ABNORMAL LOW (ref 150–400)
RBC: 2.28 MIL/uL — ABNORMAL LOW (ref 4.22–5.81)
RDW: 20.1 % — ABNORMAL HIGH (ref 11.5–15.5)
WBC: 7.7 10*3/uL (ref 4.0–10.5)
nRBC: 0.4 % — ABNORMAL HIGH (ref 0.0–0.2)

## 2020-05-20 LAB — COMPREHENSIVE METABOLIC PANEL
ALT: 35 U/L (ref 0–44)
AST: 37 U/L (ref 15–41)
Albumin: 1.7 g/dL — ABNORMAL LOW (ref 3.5–5.0)
Alkaline Phosphatase: 63 U/L (ref 38–126)
Anion gap: 3 — ABNORMAL LOW (ref 5–15)
BUN: 43 mg/dL — ABNORMAL HIGH (ref 6–20)
CO2: 24 mmol/L (ref 22–32)
Calcium: 7.9 mg/dL — ABNORMAL LOW (ref 8.9–10.3)
Chloride: 115 mmol/L — ABNORMAL HIGH (ref 98–111)
Creatinine, Ser: 1.27 mg/dL — ABNORMAL HIGH (ref 0.61–1.24)
GFR, Estimated: >60 mL/min (ref >60)
Glucose, Bld: 94 mg/dL (ref 70–99)
Potassium: 4.3 mmol/L (ref 3.5–5.1)
Sodium: 142 mmol/L (ref 135–145)
Total Bilirubin: 0.5 mg/dL (ref 0.3–1.2)
Total Protein: 4.8 g/dL — ABNORMAL LOW (ref 6.5–8.1)

## 2020-05-20 LAB — BPAM RBC
Blood Product Expiration Date: 202206072359
ISSUE DATE / TIME: 202205111503
Unit Type and Rh: 7300

## 2020-05-20 LAB — GLUCOSE, CAPILLARY
Glucose-Capillary: 93 mg/dL (ref 70–99)
Glucose-Capillary: 98 mg/dL (ref 70–99)
Glucose-Capillary: 141 mg/dL — ABNORMAL HIGH (ref 70–99)
Glucose-Capillary: 151 mg/dL — ABNORMAL HIGH (ref 70–99)
Glucose-Capillary: 109 mg/dL — ABNORMAL HIGH (ref 70–99)

## 2020-05-20 LAB — TYPE AND SCREEN
ABO/RH(D): B POS
Antibody Screen: NEGATIVE
Unit division: 0

## 2020-05-20 MED ORDER — RILPIVIRINE HCL 25 MG PO TABS
25.0000 mg | ORAL_TABLET | Freq: Every day | ORAL | Status: DC
  Administered 2020-05-21 – 2020-05-28 (×8): 25 mg via ORAL
  Filled 2020-05-20 (×8): qty 1

## 2020-05-20 NOTE — Evaluation (Signed)
Occupational Therapy Evaluation Patient Details Name: [name] [name] MRN: [id number] DOB: 1960-05-17 Today's Date: 05/20/2020    History of Present Illness Patient is a 60 year old male admitted 5/4 after being found down by family. Admitted with sepsis, SDH, multiple wound sites. MRI reading multifocal acute to subacute ischemic infarcts involving the bilateral cerebral and cerebellar hemispheres, Small subdural hemorrhage without shift. 5/11 levophed stopped. PMH significant for HIV, ETOH use, Rectal cancer, tobacco use   Clinical Impression   Patient currently is unreliable narrator, info obtained from RN who spoke with sister. Patient with health decline past ~1 month including fall resulting in hip pain and stomach virus limiting nutrition/ability to care for himself. Per sister patient was working and caring for himself, they called to check in. After fall patient asking to shower at sister's house as he couldn't get into his tub very well. Family alerted when patient missed a shift a work, upon arrival found patient down between toilet and bathtub. Patient with profound global weakness as well as multiple wounds limiting his activity tolerance. Calls out in pain and says "stop" with ROM assessment of LEs. At bed level patient unable to blow his nose with wash cloth despite maximal assist due to limited UE ROM, weakness and possible cognition (patient turning head away unsure if due to pain vs cognition). Acute OT to follow to try and progress patient activity tolerance, strength, ROM in order to reduce caregiver burden and maximize patient participation in ADLs. At this time patient will need 24/7 assistance and care post hospitalization.    Follow Up Recommendations  SNF    Equipment Recommendations  Other (comment) (TBD pending tolerance of mobility)       Precautions / Restrictions Precautions Precautions: Fall Precaution Comments: multiple wound sites, very painful with minimal  movement Restrictions Weight Bearing Restrictions: No      Mobility Bed Mobility Overal bed mobility: Needs Assistance             General bed mobility comments: total A for all repositioning and sliding up in bed, would not tolerate more    Transfers                 General transfer comment: unable    Balance Overall balance assessment: History of Falls                                         ADL either performed or assessed with clinical judgement   ADL Overall ADL's : Needs assistance/impaired Eating/Feeding: Total assistance   Grooming: Total assistance;Bed level Grooming Details (indicate cue type and reason): two attempts to assist patient with blowing his nose with wash cloth. will turn head away from wash cloth as arm approaches his face (unsure if due to pain) very weak grip to hold/manipulate wash cloth. Upper Body Bathing: Total assistance;Bed level   Lower Body Bathing: Total assistance;Bed level   Upper Body Dressing : Total assistance;Bed level   Lower Body Dressing: Total assistance;Bed level     Toilet Transfer Details (indicate cue type and reason): unable, increased pain and saying "stop" with minimal repositioning/bed level activity Toileting- Clothing Manipulation and Hygiene: Total assistance;Bed level       Functional mobility during ADLs: Total assistance General ADL Comments: patient is significantly below his baseline per report from family that patient was working prior to hospitalization. patient impacting by pain, weakness,  cognition, poor activity tolerance                  Pertinent Vitals/Pain Pain Assessment: Faces Faces Pain Scale: Hurts worst Pain Location: with repositioning/ROM testing Pain Descriptors / Indicators: Moaning;Grimacing Pain Intervention(s): Limited activity within patient's tolerance     Hand Dominance  (did not specify)   Extremity/Trunk Assessment Upper Extremity  Assessment Upper Extremity Assessment: RUE deficits/detail;LUE deficits/detail;Generalized weakness RUE Deficits / Details: edema and redness noted at R elbow, AAROM shoulder ~ 80 degrees flexion. significantly weak grip. R 5th digit contracture unable to extend at PIP joint. RUE: Unable to fully assess due to pain RUE Coordination: decreased fine motor;decreased gross motor LUE Deficits / Details: AAROM shoulder ~80 degrees, significantly weak grip ~2+/5. unable to hold onto wash cloth to blow his nose. LUE: Unable to fully assess due to pain LUE Coordination: decreased fine motor;decreased gross motor   Lower Extremity Assessment Lower Extremity Assessment: Defer to PT evaluation       Communication Communication Communication: No difficulties   Cognition Arousal/Alertness: Awake/alert Behavior During Therapy: WFL for tasks assessed/performed Overall Cognitive Status: No family/caregiver present to determine baseline cognitive functioning                                 General Comments: patient oriented to place and month, stating year is [birth year]. patient very distractible by extraneous environmental stimuli, needs increased time to process/answer questions. told therapy he hadn't walked in 3-4 months + sisters come over almost daily however per family this is not true      Exercises Exercises: Other exercises Other Exercises Other Exercises: educate/strongly encourage patient to perform fist pumps and working on flex/ext of elbow including lifting off bed as tolerated        Home Living Family/patient expects to be discharged to:: Private residence Living Arrangements: Alone Available Help at Discharge: Family;Available PRN/intermittently Type of Home: Apartment             Bathroom Shower/Tub: Teacher, early years/pre: Standard         Additional Comments: info primarily received from RN who spoke with sister as patient is not a reliable  historian. unsure of home DME, patient stating has walker and wheelchair      Prior Functioning/Environment Level of Independence: Independent        Comments: info from RN who spoke with sister, apparently patient having health decline for past ~1 month prior to hospitalization. Had a fall resulting in hip pain however patient was still mobilizing and working. ~1 week prior to hospitalization patient with stomach virus with sister reporting patient not eating much/taking good care of himself. When patient missed a shift at work family called and found him down in the bathroom.        OT Problem List: Decreased strength;Decreased range of motion;Decreased activity tolerance;Impaired balance (sitting and/or standing);Decreased coordination;Decreased cognition;Decreased safety awareness;Decreased knowledge of use of DME or AE;Impaired UE functional use;Pain;Increased edema      OT Treatment/Interventions: Self-care/ADL training;Therapeutic exercise;DME and/or AE instruction;Therapeutic activities;Cognitive remediation/compensation;Patient/family education;Balance training    OT Goals(Current goals can be found in the care plan section) Acute Rehab OT Goals Patient Stated Goal: "I was walking with a walker" OT Goal Formulation: With patient Time For Goal Achievement: 06/03/20 Potential to Achieve Goals: Fair  OT Frequency: Min 2X/week   Barriers to D/C: Decreased caregiver support  at baseline lives alone,  unsure of family availability       Co-evaluation PT/OT/SLP Co-Evaluation/Treatment: Yes Reason for Co-Treatment: Complexity of the patient's impairments (multi-system involvement);To address functional/ADL transfers;For patient/therapist safety   OT goals addressed during session: ADL's and self-care;Strengthening/ROM      AM-PAC OT "6 Clicks" Daily Activity     Outcome Measure Help from another person eating meals?: Total Help from another person taking care of personal  grooming?: Total Help from another person toileting, which includes using toliet, bedpan, or urinal?: Total Help from another person bathing (including washing, rinsing, drying)?: Total Help from another person to put on and taking off regular upper body clothing?: Total Help from another person to put on and taking off regular lower body clothing?: Total 6 Click Score: 6   End of Session Nurse Communication: Mobility status  Activity Tolerance: Patient limited by pain Patient left: in bed;with call bell/phone within reach  OT Visit Diagnosis: Other abnormalities of gait and mobility (R26.89);Muscle weakness (generalized) (M62.81);History of falling (Z91.81);Other symptoms and signs involving cognitive function;Pain Pain - part of body:  (global)                Time: 8182-9937 OT Time Calculation (min): 34 min Charges:  OT General Charges $OT Visit: 1 Visit OT Evaluation $OT Eval Low Complexity: Falls View OT OT pager: [place] 05/20/2020, 11:48 AM

## 2020-05-20 NOTE — Progress Notes (Incomplete)
NAME:  [name] [name], MRN:  [id number], DOB:  05-12-1960, LOS: 7 ADMISSION DATE:  05/12/2020, CONSULTATION DATE:  5/4 REFERRING MD:  Rancour, CHIEF COMPLAINT:  Found down   History of Present Illness:  60 y/o male admitted from the [facility] ER on 5/4 after being found down.  Noted to have sepsis physiology and a subdural hemorrhage.  NSGY reported to the ER that there was no indication for an intervention (findings concerning for possible embolic ischemic infarcts), TTE negative. R Hip MRI with fluid collection, s/p aspiration per IR.  Blood cultures positive for Group G Strep.    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 CT ABD/Pelvis > no acute intra-abdominal process,  evidence of sacral decubitus, slight increase compared to prior but no underlying fluid collection, left hip prosthesis   5/5 CT Head > posterior parafalcine SDH.  Cefepime, vanco, flagyl. Remains confused, GP bacteremia on BCID. Family reported 1 wk hx of GI c/o's, fell landing on right hip, concerned he has been drinking more & difficulties with medication identification.  MRI R Hip > large fluid collection surrounding right hip arthroplasty with minimal peripheral enhancement, fluid extends along the femoral component within the medullary cavity of the proximal femur. Small soft tissue defect along the right aspect of the gluteal cleft, bilateral hydroceles. MRI Brain with multifocal acute to subacute ischemic infarcts involving the bilateral cerebral and cerebellar hemispheres, associated mild scattered petechial hemorrhage - possible central thromboembolic etiology. Small subdural hemorrhage without shift. Chronic small vessel  disease. Limited TTE b/c of patient cooperation, but no evidence of vegetation, GI DD. ABX changed to ceftriaxone.   5/6 Aspiration of R hip. Ortho aware of findings.    5/8 More alert, oriented to person. Remains on levophed gtt.   5/9 Cortisol assessed, 13.  Stress dose steroids initiated.   5/10 Remains on 2mcg levo, mental status significantly improved.  SSI added with steroids.    Interim History / Subjective:  No events over night.  Objective   Blood pressure (Abnormal) 96/55, pulse 64, temperature 97.6 F (36.4 C), temperature source Axillary, resp. rate 16, height 6\' 2"  (1.88 m), weight 74.6 kg, SpO2 96 %.        Intake/Output Summary (Last 24 hours) at 05/20/2020 0724 Last data filed at 05/20/2020 0400 Gross per 24 hour  Intake 1771.99 ml  Output 1050 ml  Net 721.99 ml   Filed Weights   05/17/20 0500 05/19/20 0500 05/20/20 0500  Weight: 68.5 kg 73 kg 74.6 kg    Examination:   Labs/imaging that I havepersonally reviewed  (right click and "Reselect all SmartList Selections" daily)  See below  Resolved Hospital Problem list   Hypokalemia    Hypophosphatemia Septic shock ->off pressors as of  Assessment & Plan:   Strep Bacteremia c/b Adrenal Insufficiency  -->shock resolved.  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  DDx includes skin/soft tissue source, endocarditis (but not seen on TTE), & HIV related opportunistic infections.  History of B strep septic hip/psoas abscess.  Current MRI of right hip with large fluid collection (fluid  culture was neg but had sl elevated WBC). Has also had sacral decubitus for at least 8 years with healing mixed with opening of wound.  -being followed by ID.  Plan Cont ceftriaxone as directed by Infectious disease.  Slow taper of stress dose steroids    -continue ceftriaxone  -change levophed goal, wean to  off for SBP >90 -pending pelvic XRAY > per Ortho  -pending repeat blood cultures, may need TEE -continue stress dose steroids  Fall with head trauma causing SDH Stable on head imaging, by report NSGY says no intervention needed after discussion with ER -improving neuro status, follow clinical exam  -PT efforts / mobilize    Thrombocytopenia S/p platelets 5/7 with minimal improvement on 5/8  -trend CBC -monitor for evidence of bleeding   Acute Metabolic Encephalopathy Mixed picture, metabolic cause from sepsis but confounded by head injury; doesn't appear to be in alcohol withdrawal but at risk for that; some improvement with fluids, antibiotics but he is far from his baseline (works, drives etc) -improving clinical exam  -minimize all sedating meds -no evidence of ETOH withdrawal  -PT as above   Sacral wound, chronic, present on admission Present for at least 8 years -WOC rec's for santyl  -frequent turns   Hypernatremia   AKI   -LR at [facility]  -continue free water PT  -Trend BMP / urinary output -Replace electrolytes as indicated -Avoid nephrotoxic agents, ensure adequate renal perfusion  HIV Lymphopenia noted on CBC on admit. CD4 <35 -defer to ID -continue Prezcobix + Tivicay  -appreciate Palliative Care evaluation   Steroid Induced Hyperglycemia -SSI, very sensitive   Moderate Malnutrition  -TF per Nutrition  -pt pulled NGT, will ask SLP to review for swallowing 5/11 before placing feeding tube again  -diet / nutrition as able   History of anal cancer, recent biopsy by Dr. Dema Severin negative for malignancy -supportive care   Skin changes feet > Kaposi's?  -per ID  Best practice (right click and "Reselect all SmartList Selections" daily)  Diet:  NPO, re-eval with SLP 5/11   Pain/Anxiety/Delirium protocol (if indicated): No VAP protocol (if indicated): Not indicated DVT prophylaxis: SCD GI prophylaxis: N/A Glucose control:  SSI No Central venous access:   N/A Arterial line:  N/A Foley:  Yes, and it is still needed Mobility:  bed rest  PT consulted: N/A Last date of multidisciplinary goals of care discussion 5/6 - follow up conversation with sister [name], they have not discussed code status as of yet.  Reviewed reversible / non-reversible illnesses. Discussed his overall state of health and current acute processes.   Code Status:  full code Disposition: ICU  Sisters updated in detail on 5/10.  Will update on arrival 5/11.     CC Time:

## 2020-05-20 NOTE — Evaluation (Signed)
Physical Therapy Evaluation Patient Details Name: [name] [name] MRN: [id number] DOB: Nov 28, 1960 Today's Date: 05/20/2020   History of Present Illness  Patient is a 60 year old male admitted 5/4 after being found down by family. Admitted with sepsis, SDH from fall,H/O R hip replacement and multiple I and Ds, multiple wound sites. MRI reading multifocal acute to subacute ischemic infarcts involving the bilateral cerebral and cerebellar hemispheres,  PMH significant for HIV, ETOH use, Rectal cancer, tobacco use  Clinical Impression  Evaluation very limited due to patient's inability to tolerate ROm of RUE and both LE's, especially left ankle and hip. Patient not reliable  With information. At baseline per RN providing history via sister on admission, pt. Lived alone, was ambulatory and  Worked.  Right ankle positioned in inversion, wedged against foot  Of bed , patient indicating much pain when attempted to reposition ankle.. Bed/mattress are short for his stature, increasing pressure on feet, Pt. Also reports heel is painful(has a wound there).Conmtinue to monitor right ankle next visit/May consider injury at fall PTA.  Pt admitted with above diagnosis.  Pt currently with functional limitations due to the deficits listed below (see PT Problem List). Pt will benefit from skilled PT to increase their independence and safety with mobility to allow discharge to the venue listed below.       Follow Up Recommendations SNF    Equipment Recommendations  None recommended by PT    Recommendations for Other Services       Precautions / Restrictions Precautions Precautions: Fall Precaution Comments: multiple wound sites, very painful with minimal movement of right  UE, R ankle, R hip Restrictions Weight Bearing Restrictions: No      Mobility  Bed Mobility Overal bed mobility: Needs Assistance             General bed mobility comments: total A for all repositioning and sliding up in bed,  would not tolerate more    Transfers                 General transfer comment: unable  Ambulation/Gait                Stairs            Wheelchair Mobility    Modified Rankin (Stroke Patients Only)       Balance Overall balance assessment: History of Falls                                           Pertinent Vitals/Pain Pain Assessment: Faces Pain Score: 10-Worst pain ever Faces Pain Scale: Hurts worst Pain Location: with repositioning/ROM testing, especially right shoulder, right ankle(resting in inversion and pushed against footboard.), right  hip,and left hip. Pain Descriptors / Indicators: Moaning;Grimacing Pain Intervention(s): Monitored during session;Limited activity within patient's tolerance    Home Living Family/patient expects to be discharged to:: Private residence Living Arrangements: Alone Available Help at Discharge: Family;Available PRN/intermittently Type of Home: Apartment           Additional Comments: info primarily received from RN who spoke with sister as patient is not a reliable historian. unsure of home DME, patient stating has walker and wheelchair    Prior Function Level of Independence: Independent         Comments: info from RN who spoke with sister, apparently patient having health decline for past ~1 month prior to  hospitalization. Had a fall resulting in hip pain however patient was still mobilizing and working. ~1 week prior to hospitalization patient with stomach virus with sister reporting patient not eating much/taking good care of himself. When patient missed a shift at work family called and found him down in the bathroom.     Hand Dominance   Dominant Hand:  (did not specify)    Extremity/Trunk Assessment   Upper Extremity Assessment Upper Extremity Assessment: RUE deficits/detail;LUE deficits/detail;Generalized weakness RUE Deficits / Details: edema and redness noted at R elbow,  AAROM shoulder ~ 80 degrees flexion. significantly weak grip. R 5th digit contracture unable to extend at PIP joint. RUE: Unable to fully assess due to pain RUE Coordination: decreased fine motor;decreased gross motor LUE Deficits / Details: AAROM shoulder ~80 degrees, significantly weak grip ~2+/5. unable to hold onto wash cloth to blow his nose. LUE: Unable to fully assess due to pain LUE Coordination: decreased fine motor;decreased gross motor    Lower Extremity Assessment Lower Extremity Assessment: RLE deficits/detail;LLE deficits/detail RLE Deficits / Details: significant limits in right ankle, unable to m ove to neutral from inversion, Tolerated ~ 30 degrees of hip flex/knee flex RLE: Unable to fully assess due to pain LLE Deficits / Details: limited by pain and stiffness LLE: Unable to fully assess due to pain    Cervical / Trunk Assessment Cervical / Trunk Assessment: Other exceptions Cervical / Trunk Exceptions: patient unable to flex neck to look at feet.  Communication   Communication: No difficulties  Cognition Arousal/Alertness: Awake/alert Behavior During Therapy: WFL for tasks assessed/performed Overall Cognitive Status: Impaired/Different from baseline                                 General Comments: patient oriented to place and month, stating year is 36. patient very distractible by extraneous environmental stimuli, needs increased time to process/answer questions. told therapy he hadn't walked in 3-4 months + sisters come over almost daily however per family this is not true      General Comments General comments (skin integrity, edema, etc.): unable    Exercises Other Exercises Other Exercises: educate/strongly encourage patient to perform fist pumps and working on flex/ext of elbow including lifting off bed as tolerated   Assessment/Plan    PT Assessment Patient needs continued PT services  PT Problem List Decreased strength;Decreased  mobility;Decreased safety awareness;Decreased range of motion;Decreased knowledge of precautions;Decreased coordination;Decreased activity tolerance;Decreased cognition;Decreased balance;Pain;Decreased skin integrity       PT Treatment Interventions Functional mobility training;Balance training;Patient/family education;Therapeutic exercise;Therapeutic activities;Cognitive remediation    PT Goals (Current goals can be found in the Care Plan section)  Acute Rehab PT Goals Patient Stated Goal: "I was walking with a walker" PT Goal Formulation: With patient Time For Goal Achievement: 06/03/20 Potential to Achieve Goals: Poor    Frequency Min 2X/week   Barriers to discharge Decreased caregiver support      Co-evaluation PT/OT/SLP Co-Evaluation/Treatment: Yes Reason for Co-Treatment: Complexity of the patient's impairments (multi-system involvement);For patient/therapist safety;To address functional/ADL transfers PT goals addressed during session: Mobility/safety with mobility OT goals addressed during session: ADL's and self-care;Strengthening/ROM       AM-PAC PT "6 Clicks" Mobility  Outcome Measure Help needed turning from your back to your side while in a flat bed without using bedrails?: Total Help needed moving from lying on your back to sitting on the side of a flat bed without using bedrails?: Total  Help needed moving to and from a bed to a chair (including a wheelchair)?: Total Help needed standing up from a chair using your arms (e.g., wheelchair or bedside chair)?: Total Help needed to walk in hospital room?: Total Help needed climbing 3-5 steps with a railing? : Total 6 Click Score: 6    End of Session   Activity Tolerance: Patient limited by pain Patient left: in bed;with call bell/phone within reach Nurse Communication: Mobility status;Need for lift equipment PT Visit Diagnosis: Difficulty in walking, not elsewhere classified (R26.2);Pain Pain - Right/Left:  Right Pain - part of body: Shoulder;Hip;Ankle and joints of foot    Time: 1000-1032 PT Time Calculation (min) (ACUTE ONLY): 32 min   Charges:   PT Evaluation $PT Eval Low Complexity: [facility] Acute Rehabilitation Services Pager [phone] Office [phone]   [name] 05/20/2020, 1:47 PM

## 2020-05-20 NOTE — Progress Notes (Signed)
NAME:  [name] [name], MRN:  [id number], DOB:  01-Apr-1960, LOS: 7 ADMISSION DATE:  05/12/2020, CONSULTATION DATE:  5/4 REFERRING MD:  Rancour, CHIEF COMPLAINT:  Found down   History of Present Illness:  60 y/o male admitted from the [facility] ER on 5/4 after being found down.  Noted to have sepsis physiology and a subdural hemorrhage.  NSGY reported to the ER that there was no indication for an intervention (findings concerning for possible embolic ischemic infarcts), TTE negative. R Hip MRI with fluid collection, s/p aspiration per IR.  Blood cultures positive for Group G Strep.    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 CT ABD/Pelvis > no acute intra-abdominal process,  evidence of sacral decubitus, slight increase compared to prior but no underlying fluid collection, left hip prosthesis   5/5 CT Head > posterior parafalcine SDH.  Cefepime, vanco, flagyl. Remains confused, GP bacteremia on BCID. Family reported 1 wk hx of GI c/o's, fell landing on right hip, concerned he has been drinking more & difficulties with medication identification.  MRI R Hip > large fluid collection surrounding right hip arthroplasty with minimal peripheral enhancement, fluid extends along the femoral component within the medullary cavity of the proximal femur. Small soft tissue defect along the right aspect of the gluteal cleft, bilateral hydroceles. MRI Brain with multifocal acute to subacute ischemic infarcts involving the bilateral cerebral and cerebellar hemispheres, associated mild scattered petechial hemorrhage - possible central thromboembolic etiology. Small subdural hemorrhage without shift. Chronic small vessel  disease. Limited TTE b/c of patient cooperation, but no evidence of vegetation, GI DD. ABX changed to ceftriaxone.   5/6 Aspiration of R hip. Ortho aware of findings.    5/8 More alert, oriented to person. Remains on levophed gtt.   5/9 Cortisol assessed, 13.  Stress dose steroids initiated.   5/10 Remains on 40mcg levo, mental status significantly improved.  SSI added with steroids.   5/11 transfuse 1 unit PRBC, transition off levophed  Interim History / Subjective:  Feels like he is improving.  Had trouble swallowing pills, but did fine once these were crushed and put in apple sauce.  Objective   Blood pressure (!) 96/55, pulse 64, temperature 98.1 F (36.7 C), temperature source Axillary, resp. rate 16, height 6\' 2"  (1.88 m), weight 74.6 kg, SpO2 96 %.        Intake/Output Summary (Last 24 hours) at 05/20/2020 0814 Last data filed at 05/20/2020 0400 Gross per 24 hour  Intake 1716.53 ml  Output 1050 ml  Net 666.53 ml   Filed Weights   05/17/20 0500 05/19/20 0500 05/20/20 0500  Weight: 68.5 kg 73 kg 74.6 kg    Examination:  General - alert Eyes - pupils reactive ENT - no sinus tenderness, no stridor Cardiac - regular rate/rhythm, no murmur Chest - equal breath sounds b/l, no wheezing or rales Abdomen - soft, non tender, + bowel sounds Extremities - 1+ edema, decreased muscle bulk Skin - sacral wound Neuro - moves extremities, follows commands Psych - normal mood and behavior  Resolved Hospital Problem list   Septic shock, Hypokalemia, Hypophosphatemia, Hypernatremia, Acute metabolic encephalopathy from sepsis, AKI from ATN in setting of sepsis  Assessment & Plan:  Group G Streptococcal bacteremia. Discussion: Possible sources include Rt hip prosthesis with prior infection, skin/soft tissue.  He has hx of Streptococcal hip/psoas abscess.  MRI of Rt hip showed large fluid collection.  Sacral decubitus has been an issue for at least 8 yrs (so present prior to  admission).  Rt hip aspiration was culture negative but had 31K WBC. Plan: - continue rocephin - ID following - previously seen by Dr. [name] with ortho as outpt - continue wound care for sacral wound  Relative adrenal insufficiency. - continue solu cortef 50 mg q6h on 5/12; if hemodynamics remain stable, then can start to wean off on 5/13  Fall with head trauma causing small SDH. - monitor neuro status    Anemia of critical illness and chronic disease. Thrombocytopenia in setting of sepsis. - f/u CBC - transfuse for Hb < 7, PLT < 10K, or significant bleeding  Thrombocytopenia S/p platelets 5/7 with minimal improvement on 5/8  -trend CBC -monitor for evidence of bleeding   HIV. 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 - Lymphopenia noted on CBC on admission with CD4 <35 - continue Prezcobix + Tivicay   Steroid Induced Hyperglycemia - SSI  Moderate Malnutrition  Dysphagia. - D3 diet, meds crushed in apple sauce - f/u with speech therapy  History of anal cancer, recent biopsy by Dr. Dema Severin negative for malignancy - supportive care   Deconditioning. - PT/OT  Goals of care. - palliative care consulted  Best practice (right click and "Reselect all SmartList Selections" daily)  Diet:  D3 DVT prophylaxis: SCD GI prophylaxis: N/A Foley:  Yes, and it is still needed while allowing sacral wound to heal Mobility:  OOB with assistance Code Status:  full code Disposition: transfer to med-surg 5/12.  Will ask [facility] to assume primary care from 5/13 and PCCM off.  Labs:   CMP Latest Ref Rng & Units 05/20/2020 05/19/2020 05/18/2020  Glucose 70 - 99 mg/dL 94 184(H) 198(H)  BUN 6 - 20 mg/dL 43(H) 51(H) 52(H)  Creatinine 0.61 - 1.24 mg/dL 1.27(H) 1.31(H) 1.33(H)  Sodium 135 - 145 mmol/L 142 140 137  Potassium 3.5 - 5.1 mmol/L 4.3 4.4 4.1  Chloride 98 - 111 mmol/L 115(H) 112(H) 110  CO2 22 - 32 mmol/L 24 22 22   Calcium 8.9 - 10.3 mg/dL 7.9(L) 7.9(L) 7.9(L)  Total Protein 6.5  - 8.1 g/dL 4.8(L) - -  Total Bilirubin 0.3 - 1.2 mg/dL 0.5 - -  Alkaline Phos 38 - 126 U/L 63 - -  AST 15 - 41 U/L 37 - -  ALT 0 - 44 U/L 35 - -    CBC Latest Ref Rng & Units 05/20/2020 05/19/2020 05/19/2020  WBC 4.0 - 10.5 K/uL 7.7 - 13.6(H)  Hemoglobin 13.0 - 17.0 g/dL 7.8(L) 7.6(L) 7.2(L)  Hematocrit 39.0 - 52.0 % 24.4(L) 23.6(L) 21.7(L)  Platelets 150 - 400 K/uL 102(L) - 95(L)    CBG (last 3)  Recent Labs    05/19/20 1956 05/19/20 2141 05/20/20 0730  GLUCAP 95 93 98    Signature:  Chesley Mires, MD [place] Pager - [phone] 05/20/2020, 8:29 AM

## 2020-05-20 NOTE — Progress Notes (Signed)
[facility] for Infectious Disease   Reason for visit: follow up Streptococcal bacteremia  Interval History: received Prezcobix and Tivicay with apple sauce today without issue.  WBC wnl, afebrile.  No acute events.  Day 8 ceftriaxone Day 9 total antibiotics  Physical Exam: Constitutional:  Vitals:   05/20/20 1000 05/20/20 1100  BP: (!) 89/52 (!) 86/51  Pulse: 75 76  Resp: 18 13  Temp:    SpO2: 97% 97%  patient is in nad Respiratory: normal respiratory effort, CTA B Cardiovascular: RRR GI: soft, nt, nd  Review of Systems: Constitutional: negative for fever or chills Gastrointestinal: negative for nausea  Lab Results  Component Value Date   WBC 7.7 05/20/2020   HGB 7.8 (L) 05/20/2020   HCT 24.4 (L) 05/20/2020   MCV 107.0 (H) 05/20/2020   PLT 102 (L) 05/20/2020    Lab Results  Component Value Date   CREATININE 1.27 (H) 05/20/2020   BUN 43 (H) 05/20/2020   NA 142 05/20/2020   K 4.3 05/20/2020   CL 115 (H) 05/20/2020   CO2 24 05/20/2020    Lab Results  Component Value Date   ALT 35 05/20/2020   AST 37 05/20/2020   ALKPHOS 63 05/20/2020     Microbiology: Recent Results (from the past 240 hour(s))  Blood Culture (routine x 2)     Status: Abnormal   Collection Time: 05/12/20  6:45 PM   Specimen: BLOOD  Result Value Ref Range Status   Specimen Description   Final    BLOOD RIGHT WRIST Performed at [facility], [place] [address] [place], [place] [address]    Special Requests   Final    BOTTLES DRAWN AEROBIC AND ANAEROBIC Blood Culture adequate volume   Culture  Setup Time   Final    GRAM POSITIVE COCCI IN BOTH AEROBIC AND ANAEROBIC BOTTLES CRITICAL RESULT CALLED TO, READ BACK BY AND VERIFIED WITH: PHARM D L.Wynona Canes ON [id number] AT 0850 BY E.PARRISH Performed at [facility], [place] [address], [place], [place] [address]    Culture STREPTOCOCCUS GROUP G (A)  Final   Report Status 05/17/2020 FINAL  Final   Organism ID, Bacteria  STREPTOCOCCUS GROUP G  Final      Susceptibility   Streptococcus group g - MIC*    CLINDAMYCIN <=0.25 SENSITIVE Sensitive     ERYTHROMYCIN <=0.12 SENSITIVE Sensitive     VANCOMYCIN 0.25 SENSITIVE Sensitive     CEFTRIAXONE <=0.12 SENSITIVE Sensitive     LEVOFLOXACIN 1 SENSITIVE Sensitive     * STREPTOCOCCUS GROUP G  Blood Culture ID Panel (Reflexed)     Status: Abnormal   Collection Time: 05/12/20  6:45 PM  Result Value Ref Range Status   Enterococcus faecalis NOT DETECTED NOT DETECTED Final   Enterococcus Faecium NOT DETECTED NOT DETECTED Final   Listeria monocytogenes NOT DETECTED NOT DETECTED Final   Staphylococcus species NOT DETECTED NOT DETECTED Final   Staphylococcus aureus (BCID) NOT DETECTED NOT DETECTED Final   Staphylococcus epidermidis NOT DETECTED NOT DETECTED Final   Staphylococcus lugdunensis NOT DETECTED NOT DETECTED Final   Streptococcus species DETECTED (A) NOT DETECTED Final    Comment: Not Enterococcus species, Streptococcus agalactiae, Streptococcus pyogenes, or Streptococcus pneumoniae. CRITICAL RESULT CALLED TO, READ BACK BY AND VERIFIED WITH: PHARM D L.POINDEXTER ON [id number] AT 0850 BY E.PARRISH    Streptococcus agalactiae NOT DETECTED NOT DETECTED Final   Streptococcus pneumoniae NOT DETECTED NOT DETECTED Final   Streptococcus pyogenes NOT DETECTED NOT  DETECTED Final   A.calcoaceticus-baumannii NOT DETECTED NOT DETECTED Final   Bacteroides fragilis NOT DETECTED NOT DETECTED Final   Enterobacterales NOT DETECTED NOT DETECTED Final   Enterobacter cloacae complex NOT DETECTED NOT DETECTED Final   Escherichia coli NOT DETECTED NOT DETECTED Final   Klebsiella aerogenes NOT DETECTED NOT DETECTED Final   Klebsiella oxytoca NOT DETECTED NOT DETECTED Final   Klebsiella pneumoniae NOT DETECTED NOT DETECTED Final   Proteus species NOT DETECTED NOT DETECTED Final   Salmonella species NOT DETECTED NOT DETECTED Final   Serratia marcescens NOT DETECTED NOT DETECTED Final    Haemophilus influenzae NOT DETECTED NOT DETECTED Final   Neisseria meningitidis NOT DETECTED NOT DETECTED Final   Pseudomonas aeruginosa NOT DETECTED NOT DETECTED Final   Stenotrophomonas maltophilia NOT DETECTED NOT DETECTED Final   Candida albicans NOT DETECTED NOT DETECTED Final   Candida auris NOT DETECTED NOT DETECTED Final   Candida glabrata NOT DETECTED NOT DETECTED Final   Candida krusei NOT DETECTED NOT DETECTED Final   Candida parapsilosis NOT DETECTED NOT DETECTED Final   Candida tropicalis NOT DETECTED NOT DETECTED Final   Cryptococcus neoformans/gattii NOT DETECTED NOT DETECTED Final    Comment: Performed at [facility], [address], [place], [place] [address]  Blood Culture (routine x 2)     Status: Abnormal   Collection Time: 05/12/20  6:57 PM   Specimen: BLOOD  Result Value Ref Range Status   Specimen Description   Final    BLOOD LEFT ANTECUBITAL Performed at [place] [address] [place], [place] [address]    Special Requests   Final    BOTTLES DRAWN AEROBIC AND ANAEROBIC Blood Culture adequate volume   Culture  Setup Time   Final    GRAM POSITIVE COCCI CRITICAL VALUE NOTED.  VALUE IS CONSISTENT WITH PREVIOUSLY REPORTED AND CALLED VALUE. IN BOTH AEROBIC AND ANAEROBIC BOTTLES    Culture (A)  Final    STREPTOCOCCUS GROUP G SUSCEPTIBILITIES PERFORMED ON PREVIOUS CULTURE WITHIN THE LAST 5 DAYS. Performed at [facility], [place] [address] [place], [place] [address]    Report Status 05/17/2020 FINAL  Final  Resp Panel by RT-PCR (Flu A&B, Covid) Nasopharyngeal Swab     Status: None   Collection Time: 05/12/20  7:00 PM   Specimen: Nasopharyngeal Swab; Nasopharyngeal(NP) swabs in vial transport medium  Result Value Ref Range Status   SARS Coronavirus 2 by RT PCR NEGATIVE NEGATIVE Final    Comment: (NOTE) SARS-CoV-2 target nucleic acids are NOT DETECTED.  The SARS-CoV-2 RNA is generally detectable in upper  respiratory specimens during the acute phase of infection. The lowest concentration of SARS-CoV-2 viral copies this assay can detect is 138 copies/mL. A negative result does not preclude SARS-Cov-2 infection and should not be used as the sole basis for treatment or other patient management decisions. A negative result may occur with  improper specimen collection/handling, submission of specimen other than nasopharyngeal swab, presence of viral mutation(s) within the areas targeted by this assay, and inadequate number of viral copies(<138 copies/mL). A negative result must be combined with clinical observations, patient history, and epidemiological information. The expected result is Negative.  Fact Sheet for Patients:  EntrepreneurPulse.com.au  Fact Sheet for Healthcare Providers:  IncredibleEmployment.be  This test is no t yet approved or cleared by the [place] FDA and  has been authorized for detection and/or diagnosis of SARS-CoV-2 by FDA under an Emergency Use Authorization (EUA). This EUA will remain  in effect (meaning this test can be used) for the duration of the COVID-19 declaration under Section 564(b)(1) of the Act, 21 U.S.C.section 360bbb-3(b)(1), unless the authorization is terminated  or revoked sooner.       Influenza A by PCR NEGATIVE NEGATIVE Final   Influenza B by PCR NEGATIVE NEGATIVE Final    Comment: (NOTE) The Xpert Xpress SARS-CoV-2/FLU/RSV plus assay is intended as an aid in the diagnosis of influenza from Nasopharyngeal swab specimens and should not be used as a sole basis for treatment. Nasal washings and aspirates are unacceptable for Xpert Xpress SARS-CoV-2/FLU/RSV testing.  Fact Sheet for Patients: EntrepreneurPulse.com.au  Fact Sheet for Healthcare Providers: IncredibleEmployment.be  This test is not yet approved or cleared by the [place] FDA and has been  authorized for detection and/or diagnosis of SARS-CoV-2 by FDA under an Emergency Use Authorization (EUA). This EUA will remain in effect (meaning this test can be used) for the duration of the COVID-19 declaration under Section 564(b)(1) of the Act, 21 U.S.C. section 360bbb-3(b)(1), unless the authorization is terminated or revoked.  Performed at [facility], [place] [address], [place], [place] [address]   Urine culture     Status: Abnormal   Collection Time: 05/12/20  8:45 PM   Specimen: In/Out Cath Urine  Result Value Ref Range Status   Specimen Description   Final    IN/OUT CATH URINE Performed at [place] [address], [place], [place] [address]    Special Requests   Final    NONE Performed at [facility], [place] [address], [place], [place] [address]    Culture MULTIPLE SPECIES PRESENT, SUGGEST RECOLLECTION (A)  Final   Report Status 05/14/2020 FINAL  Final  MRSA PCR Screening     Status: None   Collection Time: 05/13/20  5:28 AM   Specimen: Nasopharyngeal  Result Value Ref Range Status   MRSA by PCR NEGATIVE NEGATIVE Final    Comment:        The GeneXpert MRSA Assay (FDA approved for NASAL specimens only), is one component of a comprehensive MRSA colonization surveillance program. It is not intended to diagnose MRSA infection nor to guide or monitor treatment for MRSA infections. Performed at [facility] [place] [address] [place] [address] [place], [place] [address]   Body fluid culture w Gram Stain     Status: None   Collection Time: 05/14/20  3:29 PM   Specimen: Joint, Hip; Body Fluid  Result Value Ref Range Status   Specimen Description   Final    HIP RIGHT Performed at [place] [address], [place], [place] [address]    Special Requests   Final    NONE Performed at [facility], [place] [address], [place], [place] [address]    Gram Stain   Final     MODERATE WBC PRESENT,BOTH PMN AND MONONUCLEAR NO ORGANISMS SEEN    Culture   Final    NO GROWTH 3 DAYS Performed at [facility], [place] [address], [place],  [address]    Report Status 05/18/2020 FINAL  Final  Culture, blood (Routine X 2) w Reflex to ID Panel     Status: None (Preliminary result)   Collection Time: 05/18/20 10:36 AM   Specimen: BLOOD LEFT HAND  Result Value Ref Range Status   Specimen Description   Final    BLOOD LEFT HAND Performed at [place] [address] [place],  [place] [address]    Special Requests   Final    BOTTLES DRAWN AEROBIC ONLY Blood Culture adequate volume Performed at [place] [address] [place], [place] [address]    Culture   Final    NO GROWTH 2 DAYS Performed at [place] [address], [place], [place] [address]    Report Status PENDING  Incomplete  Culture, blood (Routine X 2) w Reflex to ID Panel     Status: None (Preliminary result)   Collection Time: 05/18/20 10:36 AM   Specimen: BLOOD LEFT HAND  Result Value Ref Range Status   Specimen Description   Final    BLOOD LEFT HAND Performed at [place] [address], [place], [place] [address]    Special Requests   Final    BOTTLES DRAWN AEROBIC ONLY Blood Culture results may not be optimal due to an inadequate volume of blood received in culture bottles Performed at [place] [address], [place], [place] [address]    Culture   Final    NO GROWTH 2 DAYS Performed at [place] [address], [place], [place] [address]    Report Status PENDING  Incomplete    Impression/Plan:  1. Right hip fluid collection - concern for infection with significant WBCs noted on aspiration of 31,000, but culture negative.  ? If debridement or exchange indicated.  Followed by Dr. [name] as an outpatient.  Will continue with antibiotics.    2.  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    3.  HIV - on Prezcobix and Tivicay and taking with apple sauce.  He was able to take oral pantoprazole so will also add back rilpivirine, which cannot be crushed.    4.  ppi - will stop the ppi which is contraindicated with his rilpivirine and he was not on it prior to admission.

## 2020-05-20 NOTE — TOC Progression Note (Signed)
Transition of Care [facility]) - Progression Note    Patient Details  Name: [name] [name] MRN: [id number] Date of Birth: July 03, 1960  Transition of Care [facility]) CM/SW Contact  Leeroy Cha, RN Phone Number: 05/20/2020, 8:43 AM  Clinical Narrative:    Significant Hospital Events: Including procedures, antibiotic start and stop dates in addition to other pertinent events    5/4 Admit after being found down, sepis and SDH. CT ABD/Pelvis > no acute intra-abdominal process,  evidence of sacral decubitus, slight increase compared to prior but no underlying fluid collection, left hip prosthesis   5/5 CT Head > posterior parafalcine SDH.  Cefepime, vanco, flagyl. Remains confused, GP bacteremia on BCID. Family reported 1 wk hx of GI c/o's, fell landing on right hip, concerned he has been drinking more & difficulties with medication identification.  MRI R Hip > large fluid collection surrounding right hip arthroplasty with minimal peripheral enhancement, fluid extends along the femoral component within the medullary cavity of the proximal femur. Small soft tissue defect along the right aspect of the gluteal cleft, bilateral hydroceles. MRI Brain with multifocal acute to subacute ischemic infarcts involving the bilateral cerebral and cerebellar hemispheres, associated mild scattered petechial hemorrhage - possible central thromboembolic etiology. Small subdural hemorrhage without shift. Chronic small vessel disease. Limited TTE b/c of patient cooperation, but no evidence of vegetation, GI DD. ABX changed to ceftriaxone.   5/6 Aspiration of R hip. Ortho aware of findings.    5/8 More alert, oriented to person. Remains on levophed gtt.   5/9 Cortisol assessed, 13.  Stress dose steroids initiated.   5/10 Remains on 14mcg levo, mental status significantly improved.  SSI added with steroids.   5/11 transfuse 1 unit PRBC, transition off levophed  PLAN: to return to home ,following for  progression.   Expected Discharge Plan: Home/Self Care Barriers to Discharge: Continued Medical Work up  Expected Discharge Plan and Services Expected Discharge Plan: Home/Self Care   Discharge Planning Services: CM Consult   Living arrangements for the past 2 months: Apartment                                       Social Determinants of Health (SDOH) Interventions    Readmission Risk Interventions No flowsheet data found.

## 2020-05-20 NOTE — Progress Notes (Addendum)
  Speech Language Pathology Treatment: Dysphagia  Patient Details Name: NICHOLUS CHANDRAN MRN: [id number] DOB: [birth year] Today's Date: 05/20/2020 Time: 1410-1445 SLP Time Calculation (min) (ACUTE ONLY): 35 min  Assessment / Plan / Recommendation Clinical Impression  Pt today located sitting upright in bed - RN reports tolerance of po diet but difficulty with large pills.  Pt pills are now crushed with puree with good tolerance.   Upon interviewing pt, he advised that he has required heimlich maneuver in the past - due to choking on a chicken wing.  Functional po observation including observing intake of graham cracker, applesauce and water, encouraging pt to self feed.   Pt benefited from verbal cue to cease talking - masticate and swallow.  Throat clearing noted at baseine ? Due to secretion retention in pharynx-and larynx.  Swallow initiation delay increased with solids - max at 14 seconds and with liquids max 7 seconds.  Cues to swallow needed approx 50% of times with solids - thus pt will need full supervision. In addition, throat clearing noted after 3/8 thin liquid boluses ( Throat clear at baseline apparent yesterday as well).  Cough observed after swallow cracker bolus after 2/6 boluses and pt reported sensation of retention with direct question cue- Providing puree bolus facilitated sensation of clearance and alleviated cough.    Pt requires cues to focus on task and elicit swallow. Using teach back, written precautions with max cues to reiterate, he verbalized need to [facility] and concern for aspiration.   Will follow pt closely given his neurological diagnosis, cognition and ongoing soft signs of potential multifactorial dysphagia.    Belching noted at beginning of session after 2 liquids swallows   = then ceased.  Recurred x2 at end of session. Pt denies refluxing into pharynx/larynx - recommend he stay upright for at least 30 minutes after meals as she can tolerate.    Pt will  continue to require full supervision with all po and strict compliance to swallow precautions.    HPI HPI: adm w/AMS d/t sepsis- fall possibly after ETOH consumption, down for 2 days , found to have a small subdural hemorrhage, bilateral cerebral and cerebellar hemispheres events. Pt also with hypothermal upon admit - bear hugger, has sacral wound with h/o Rectal ca s/p radiation, HIV.  He has Dohoff tube in place and has demonstrated improved mentation thus for swallow eval ordered,  Lungs were clear at admit, now with rhonchi, CT neck Prominent spondylosis at C5-6 and C6-7. Mild diffuse facet hypertrophy.      SLP Plan  Continue with current plan of care       Recommendations  Diet recommendations: Dysphagia 3 (mechanical soft);Thin liquid Liquids provided via: Cup;Straw Medication Administration: Crushed with puree (whole if small) Supervision: Staff to assist with self feeding Compensations: Slow rate;Small sips/bites;Other (Comment) Postural Changes and/or Swallow Maneuvers: Seated upright 90 degrees;Upright 30-60 min after meal                Oral Care Recommendations: Oral care QID Follow up Recommendations: Skilled Nursing facility SLP Visit Diagnosis: Dysphagia, unspecified (R13.10) Plan: Continue with current plan of care       [place], Tarhonda Hollenberg Ann 05/20/2020, 2:51 PM  Kathleen Lime, MS [facility] SLP Acute Rehab Services Office [phone] Pager [phone]

## 2020-05-21 LAB — BASIC METABOLIC PANEL
Anion gap: 3 — ABNORMAL LOW (ref 5–15)
BUN: 43 mg/dL — ABNORMAL HIGH (ref 6–20)
CO2: 25 mmol/L (ref 22–32)
Calcium: 7.8 mg/dL — ABNORMAL LOW (ref 8.9–10.3)
Chloride: 116 mmol/L — ABNORMAL HIGH (ref 98–111)
Creatinine, Ser: 1.36 mg/dL — ABNORMAL HIGH (ref 0.61–1.24)
GFR, Estimated: >60 mL/min (ref >60)
Glucose, Bld: 94 mg/dL (ref 70–99)
Potassium: 3.9 mmol/L (ref 3.5–5.1)
Sodium: 144 mmol/L (ref 135–145)

## 2020-05-21 LAB — CBC
HCT: 24.5 % — ABNORMAL LOW (ref 39.0–52.0)
Hemoglobin: 7.7 g/dL — ABNORMAL LOW (ref 13.0–17.0)
MCH: 34.7 pg — ABNORMAL HIGH (ref 26.0–34.0)
MCHC: 31.4 g/dL (ref 30.0–36.0)
MCV: 110.4 fL — ABNORMAL HIGH (ref 80.0–100.0)
Platelets: 141 10*3/uL — ABNORMAL LOW (ref 150–400)
RBC: 2.22 MIL/uL — ABNORMAL LOW (ref 4.22–5.81)
RDW: 20.5 % — ABNORMAL HIGH (ref 11.5–15.5)
WBC: 9.4 10*3/uL (ref 4.0–10.5)
nRBC: 0.4 % — ABNORMAL HIGH (ref 0.0–0.2)

## 2020-05-21 LAB — GLUCOSE, CAPILLARY
Glucose-Capillary: 90 mg/dL (ref 70–99)
Glucose-Capillary: 102 mg/dL — ABNORMAL HIGH (ref 70–99)
Glucose-Capillary: 87 mg/dL (ref 70–99)
Glucose-Capillary: 95 mg/dL (ref 70–99)

## 2020-05-21 MED ORDER — ENSURE ENLIVE PO LIQD
237.0000 mL | Freq: Three times a day (TID) | ORAL | Status: DC
  Administered 2020-05-21 – 2020-05-28 (×15): 237 mL via ORAL

## 2020-05-21 MED ORDER — DOLUTEGRAVIR SODIUM 50 MG PO TABS
50.0000 mg | ORAL_TABLET | Freq: Every day | ORAL | Status: DC
  Administered 2020-05-21 – 2020-05-28 (×8): 50 mg via ORAL
  Filled 2020-05-21 (×8): qty 1

## 2020-05-21 NOTE — Progress Notes (Signed)
Nutrition Follow-up  DOCUMENTATION CODES:   Non-severe (moderate) malnutrition in context of chronic illness,Underweight  INTERVENTION:   - Ensure Enlive po TID, each supplement provides 350 kcal and 20 grams of protein  NUTRITION DIAGNOSIS:   Moderate Malnutrition related to chronic illness (HIV/AIDS, hx of rectal cancer) as evidenced by moderate fat depletion,moderate muscle depletion,severe muscle depletion.  Ongoing.  GOAL:   Patient will meet greater than or equal to 90% of their needs  Progressing.  MONITOR:   Diet advancement,TF tolerance,Labs,Weight trends,Skin  ASSESSMENT:   60 y.o. male with medical history of HIV/AIDS, arthritis, gout, hepatitis B, anemia, and squamous cell rectal cancer s/p radiation. He presented to the ED via EMS after being found down with unknown amount of time down. He had been seen 2 days PTA. CT head in the ED showed small subdural hematoma. Neurosurgery saw him in the ED and signed off.  5/4: admitted  5/6: small bore NGT placed, TF initiated 5/11: NGT removed, diet advanced  SLP evaluated 5/12: recommending dysphagia 3 diet. Pt consumed 75% of breakfast yesterday. Pt requiring feeding assistance. Will order Ensure supplements for additional kcals and protein.  Admission weight: 141 lbs. Current weight: 169 lbs.  I/Os: +11.7L since admit UOP: 550 ml  Medications: Lactated ringers  Labs reviewed: CBGs: 90-151  Diet Order:   Diet Order            DIET DYS 3 Room service appropriate? Yes; Fluid consistency: Thin  Diet effective now                 EDUCATION NEEDS:   Not appropriate for education at this time  Skin:  Skin Assessment: Skin Integrity Issues: Skin Integrity Issues:: DTI,Stage I,Stage II,Unstageable,Other (Comment) DTI: upper back; R shoulder Stage I: bilateral heels; L elbow Stage II: R elbow Unstageable: full thickness to sacrum Other: full thickness maceration to rectum  Last BM:  5/5  Height:    Ht Readings from Last 1 Encounters:  05/12/20 6\' 2"  (1.88 m)    Weight:   Wt Readings from Last 1 Encounters:  05/21/20 76.8 kg    Ideal Body Weight:  86.36 kg  BMI:  Body mass index is 21.74 kg/m.  Estimated Nutritional Needs:   Kcal:  2350-2550 kcal  Protein:  120-135 grams  Fluid:  >/= 2.5 L/day  Clayton Bibles, MS, RD, LDN Inpatient Clinical Dietitian Contact information available via Amion

## 2020-05-21 NOTE — Care Management Important Message (Signed)
Important Message  Patient Details IM Letter given to the Patient. Name: [name] [name] MRN: [id number] Date of Birth: January 28, 1960   Medicare Important Message Given:  Yes     [name] 05/21/2020, 12:29 PM

## 2020-05-21 NOTE — Progress Notes (Addendum)
    [name] for Infectious Disease   Reason for visit: Follow up on Streptococcal bacteremia  Interval History: WBC wnl, afebrile.   Physical Exam: Constitutional:  Vitals:   05/21/20 0510 05/21/20 1426  BP: 140/77 138/68  Pulse: 66 71  Resp: 20   Temp: 97.7 F (36.5 C) 97.8 F (36.6 C)  SpO2: 100% 97%    Impression: right hip at prosthetic joint fluid collection and Strep bacteremia.   Plan: 1.  Continue ceftriaxone 2.  Continue ARVs  I will follow up again next week

## 2020-05-21 NOTE — Progress Notes (Signed)
PROGRESS NOTE    [name] [name]  OYD:[id number] DOB: March 07, 1960 DOA: 05/12/2020 PCP: [name], MD   Brief Narrative: This 60 years old male with past medical history significant for HIV, noncompliant with his medications, gout, DVT, rectal cancer, tobacco abuse presented in the ED after being found down.  Patient was admitted for sepsis and subdural hemorrhage.  Neurosurgery was consulted, states no intervention needed.  Findings were concerning for possible embolic ischemic infarcts.  TTE is negative.  Right hip MRI shows fluid collection s/p aspiration by IR,  blood cultures positive for group B streptococci.  ID recommended long-term IV antibiotics.   Significant hospital events:  5/4 Admit after being found down, sepis and SDH. CT ABD/Pelvis > no acute intra-abdominal process,  evidence of sacral decubitus, slight increase compared to prior but no underlying fluid collection, left hip prosthesis   5/5 CT Head > posterior parafalcine SDH.  Cefepime, vanco, flagyl. Remains confused, GP bacteremia on BCID. Family reported 1 wk hx of GI c/o's, fell landing on right hip, concerned he has been drinking more & difficulties with medication identification.  MRI R Hip > large fluid collection surrounding right hip arthroplasty with minimal peripheral enhancement, fluid extends along the femoral component within the medullary cavity of the proximal femur. Small soft tissue defect along the right aspect of the gluteal cleft, bilateral hydroceles. MRI Brain with multifocal acute to subacute ischemic infarcts involving the bilateral cerebral and cerebellar hemispheres, associated mild scattered petechial hemorrhage - possible central thromboembolic etiology. Small subdural hemorrhage without shift. Chronic small vessel disease. Limited TTE b/c of patient cooperation, but no evidence of vegetation, GI DD. ABX changed to ceftriaxone.   5/6 Aspiration of R hip. Ortho aware of findings.    5/8 More  alert, oriented to person. Remains on levophed gtt.   5/9 Cortisol assessed, 13.  Stress dose steroids initiated.   5/10 Remains on 50mcg levo, mental status significantly improved.  SSI added with steroids.   5/11 transfuse 1 unit PRBC, transition off levophed  5/13: TRH resumed care.    Assessment & Plan:   Principal Problem:   Septic shock ([place]) Active Problems:   Human immunodeficiency virus (HIV) disease ([place])   AKI (acute kidney injury) ([place])   Lactic acidosis   SDH (subdural hematoma) ([facility])   Hypothermia due to exposure   Cellulitis   Elevated brain natriuretic peptide (BNP) level   Volume overload   Dehydration   Pressure injury of skin   Subdural empyema   Streptococcal bacteremia   Malnutrition of moderate degree   Group G streptococcal bacteremia: Possible sources include Rt hip prosthesis with prior infection, skin/soft tissue.   He has hx of Streptococcal hip/psoas abscess.  MRI of Rt hip showed large fluid collection.   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   He underwent Rt hip aspiration,  culture negative but had 31K WBC. ID consulted recommended to continue Rocephin Previously seen by Dr. [name] with ortho as outpt Continue wound care for sacral wound  Relative adrenal insufficiency. Continue solu cortef 50 mg q6h on 5/12;  if hemodynamics remain stable, then can start to wean off on 5/13.  Subdural hematoma secondary to fall with head trauma monitor neuro status.   Anemia of critical illness and chronic disease. Thrombocytopenia in setting of sepsis. Monitor CBC Transfuse for Hb < 7, PLT < 10K, or significant bleeding  Thrombocytopenia S/p platelets 5/7 with minimal improvement  on 5/8  Monitor CBC monitor for evidence of bleeding   HIV. 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 Lymphopenia noted on CBC on admission with CD4 <35 Continue Prezcobix + Tivicay   Steroid Induced  Hyperglycemia Continue SSI  Moderate Malnutrition  Dysphagia. D3 diet, meds crushed in apple sauce Continue speech therapy  History of anal cancer, recent biopsy by Dr. Dema Severin negative for malignancy Continue supportive care   Deconditioning. - PT/OT  Goals of care. - palliative care consulted.   DVT prophylaxis: SCDs Code Status: Full code. Family Communication: No family at bedside. Disposition Plan:  Status is: Inpatient  Remains inpatient appropriate because:Inpatient level of care appropriate due to severity of illness   Dispo: The patient is from: Home              Anticipated d/c is to: Home              Patient currently is not medically stable to d/c.   Difficult to place patient No   Consultants:   PCCM  Procedures:   Antimicrobials:  Anti-infectives (From admission, onward)   Start     Dose/Rate Route Frequency Ordered Stop   05/21/20 1015  dolutegravir (TIVICAY) tablet 50 mg        50 mg Oral Daily 05/21/20 0920     05/21/20 0800  rilpivirine (EDURANT) tablet 25 mg        25 mg Oral Daily with breakfast 05/20/20 1107     05/20/20 1000  sulfamethoxazole-trimethoprim (BACTRIM DS) 800-160 MG per tablet 1 tablet        1 tablet Oral Daily 05/19/20 1223     05/17/20 1015  sulfamethoxazole-trimethoprim (BACTRIM DS) 800-160 MG per tablet 1 tablet  Status:  Discontinued        1 tablet Per Tube Daily 05/17/20 0924 05/19/20 1223   05/14/20 1945  darunavir-cobicistat (PREZCOBIX) 800-150 MG per tablet 1 tablet       Note to Pharmacy: Crush and give per NG tube   1 tablet Oral Daily 05/14/20 1857     05/14/20 1945  dolutegravir (TIVICAY) tablet 50 mg  Status:  Discontinued        50 mg Per Tube Daily 05/14/20 1857 05/21/20 0920   05/14/20 1500  darunavir-cobicistat (PREZCOBIX) 800-150 MG per tablet 1 tablet  Status:  Discontinued        1 tablet Oral Daily with breakfast 05/14/20 1414 05/14/20 1418   05/14/20 1500  dolutegravir (TIVICAY) tablet 50 mg   Status:  Discontinued        50 mg Per Tube Daily 05/14/20 1414 05/14/20 1418   05/13/20 1300  cefTRIAXone (ROCEPHIN) 2 g in sodium chloride 0.9 % 100 mL IVPB        2 g 200 mL/hr over 30 Minutes Intravenous Every 12 hours 05/13/20 1207     05/13/20 0930  cefTRIAXone (ROCEPHIN) 2 g in sodium chloride 0.9 % 100 mL IVPB  Status:  Discontinued        2 g 200 mL/hr over 30 Minutes Intravenous Every 24 hours 05/13/20 0924 05/13/20 1207   05/12/20 1845  ceFEPIme (MAXIPIME) 2 g in sodium chloride 0.9 % 100 mL IVPB        2 g 200 mL/hr over 30 Minutes Intravenous  Once 05/12/20 1832 05/12/20 2015   05/12/20 1845  metroNIDAZOLE (FLAGYL) IVPB 500 mg        500 mg 100 mL/hr over 60 Minutes Intravenous  Once 05/12/20 1832 05/12/20  2025   05/12/20 1845  vancomycin (VANCOCIN) IVPB 1000 mg/200 mL premix  Status:  Discontinued        1,000 mg 200 mL/hr over 60 Minutes Intravenous  Once 05/12/20 1832 05/12/20 1837   05/12/20 1845  vancomycin (VANCOREADY) IVPB 1500 mg/300 mL        1,500 mg 150 mL/hr over 120 Minutes Intravenous  Once 05/12/20 1838 05/12/20 2225      Subjective: Patient was seen and examined at bedside.  Overnight events noted.  Patient is lying comfortably on the bed,  denies any shortness of breath.  He reports pain in both legs.  Objective: Vitals:   05/20/20 2204 05/21/20 0500 05/21/20 0510 05/21/20 1426  BP: 112/68  140/77 138/68  Pulse: 67  66 71  Resp: 18  20   Temp: (!) 97.5 F (36.4 C)  97.7 F (36.5 C) 97.8 F (36.6 C)  TempSrc: Oral  Oral Oral  SpO2: 97%  100% 97%  Weight:  76.8 kg    Height:        Intake/Output Summary (Last 24 hours) at 05/21/2020 1530 Last data filed at 05/21/2020 1500 Gross per 24 hour  Intake 1755.04 ml  Output 550 ml  Net 1205.04 ml   Filed Weights   05/19/20 0500 05/20/20 0500 05/21/20 0500  Weight: 73 kg 74.6 kg 76.8 kg    Examination:  General exam: Appears calm and comfortable , not in any acute distress. Respiratory system:  Clear to auscultation. Respiratory effort normal. Cardiovascular system: S1 & S2 heard, RRR. No JVD, murmurs, rubs, gallops or clicks. No pedal edema. Gastrointestinal system: Abdomen is nondistended, soft and nontender. No organomegaly or masses felt. Normal bowel sounds heard. Central nervous system: Alert and oriented. No focal neurological deficits. Extremities: 1+ pedal edema, no cyanosis no clubbing. Skin: Sacral wound noted.  Tenderness positive Psychiatry: Judgement and insight appear normal. Mood & affect appropriate.     Data Reviewed: I have personally reviewed following labs and imaging studies  CBC: Recent Labs  Lab 05/17/20 0419 05/18/20 0327 05/19/20 0317 05/19/20 2033 05/20/20 0259 05/21/20 0553  WBC 11.9* 12.2* 13.6*  --  7.7 9.4  HGB 9.3* 7.8* 7.2* 7.6* 7.8* 7.7*  HCT 29.2* 23.8* 21.7* 23.6* 24.4* 24.5*  MCV 112.7* 110.2* 109.6*  --  107.0* 110.4*  PLT 49* 63* 95*  --  102* 141*   Basic Metabolic Panel: Recent Labs  Lab 05/14/20 1718 05/15/20 0603 05/15/20 1659 05/16/20 0754 05/17/20 0419 05/18/20 0327 05/19/20 0317 05/20/20 0259 05/21/20 0553  NA  --  149*  --    < > 139 137 140 142 144  K  --  3.5  --    < > 4.1 4.1 4.4 4.3 3.9  CL  --  125*  --    < > 110 110 112* 115* 116*  CO2  --  19*  --    < > 22 22 22 24 25   GLUCOSE  --  141*  --    < > 145* 198* 184* 94 94  BUN  --  53*  --    < > 45* 52* 51* 43* 43*  CREATININE  --  1.42*  --    < > 1.31* 1.33* 1.31* 1.27* 1.36*  CALCIUM  --  8.0*  --    < > 7.6* 7.9* 7.9* 7.9* 7.8*  MG 2.0 1.9 1.7  --   --   --   --   --   --  PHOS 1.6* 1.3* 3.2  --   --   --   --   --   --    < > = values in this interval not displayed.   GFR: Estimated Creatinine Clearance: 64.3 mL/min (A) (by C-G formula based on SCr of 1.36 mg/dL (H)). Liver Function Tests: Recent Labs  Lab 05/15/20 0603 05/20/20 0259  AST 46* 37  ALT 32 35  ALKPHOS 60 63  BILITOT 1.6* 0.5  PROT 4.7* 4.8*  ALBUMIN 1.6* 1.7*   No  results for input(s): LIPASE, AMYLASE in the last 168 hours. No results for input(s): AMMONIA in the last 168 hours. Coagulation Profile: No results for input(s): INR, PROTIME in the last 168 hours. Cardiac Enzymes: No results for input(s): CKTOTAL, CKMB, CKMBINDEX, TROPONINI in the last 168 hours. BNP (last 3 results) No results for input(s): PROBNP in the last 8760 hours. HbA1C: No results for input(s): HGBA1C in the last 72 hours. CBG: Recent Labs  Lab 05/20/20 1138 05/20/20 1702 05/20/20 2205 05/21/20 0821 05/21/20 1217  GLUCAP 141* 151* 109* 90 102*   Lipid Profile: No results for input(s): CHOL, HDL, LDLCALC, TRIG, CHOLHDL, LDLDIRECT in the last 72 hours. Thyroid Function Tests: No results for input(s): TSH, T4TOTAL, FREET4, T3FREE, THYROIDAB in the last 72 hours. Anemia Panel: No results for input(s): VITAMINB12, FOLATE, FERRITIN, TIBC, IRON, RETICCTPCT in the last 72 hours. Sepsis Labs: Recent Labs  Lab 05/14/20 1718 05/15/20 0603 05/16/20 0305  PROCALCITON 5.81 4.00 2.85  LATICACIDVEN  --  1.7  --     Recent Results (from the past 240 hour(s))  Blood Culture (routine x 2)     Status: Abnormal   Collection Time: 05/12/20  6:45 PM   Specimen: BLOOD  Result Value Ref Range Status   Specimen Description   Final    BLOOD RIGHT WRIST Performed at [facility] [address], [place], [place] [address]    Special Requests   Final    BOTTLES DRAWN AEROBIC AND ANAEROBIC Blood Culture adequate volume   Culture  Setup Time   Final    GRAM POSITIVE COCCI IN BOTH AEROBIC AND ANAEROBIC BOTTLES CRITICAL RESULT CALLED TO, READ BACK BY AND VERIFIED WITH: PHARM D L.Wynona Canes ON [id number] AT 0850 BY E.PARRISH Performed at [facility], [place] [address] [place], [place] [address]    Culture STREPTOCOCCUS GROUP G (A)  Final   Report Status 05/17/2020 FINAL  Final   Organism ID, Bacteria STREPTOCOCCUS GROUP G  Final      Susceptibility    Streptococcus group g - MIC*    CLINDAMYCIN <=0.25 SENSITIVE Sensitive     ERYTHROMYCIN <=0.12 SENSITIVE Sensitive     VANCOMYCIN 0.25 SENSITIVE Sensitive     CEFTRIAXONE <=0.12 SENSITIVE Sensitive     LEVOFLOXACIN 1 SENSITIVE Sensitive     * STREPTOCOCCUS GROUP G  Blood Culture ID Panel (Reflexed)     Status: Abnormal   Collection Time: 05/12/20  6:45 PM  Result Value Ref Range Status   Enterococcus faecalis NOT DETECTED NOT DETECTED Final   Enterococcus Faecium NOT DETECTED NOT DETECTED Final   Listeria monocytogenes NOT DETECTED NOT DETECTED Final   Staphylococcus species NOT DETECTED NOT DETECTED Final   Staphylococcus aureus (BCID) NOT DETECTED NOT DETECTED Final   Staphylococcus epidermidis NOT DETECTED NOT DETECTED Final   Staphylococcus lugdunensis NOT DETECTED NOT DETECTED Final   Streptococcus species DETECTED (A) NOT DETECTED Final    Comment: Not Enterococcus species, Streptococcus  agalactiae, Streptococcus pyogenes, or Streptococcus pneumoniae. CRITICAL RESULT CALLED TO, READ BACK BY AND VERIFIED WITH: PHARM D L.POINDEXTER ON [id number] AT 0850 BY E.PARRISH    Streptococcus agalactiae NOT DETECTED NOT DETECTED Final   Streptococcus pneumoniae NOT DETECTED NOT DETECTED Final   Streptococcus pyogenes NOT DETECTED NOT DETECTED Final   A.calcoaceticus-baumannii NOT DETECTED NOT DETECTED Final   Bacteroides fragilis NOT DETECTED NOT DETECTED Final   Enterobacterales NOT DETECTED NOT DETECTED Final   Enterobacter cloacae complex NOT DETECTED NOT DETECTED Final   Escherichia coli NOT DETECTED NOT DETECTED Final   Klebsiella aerogenes NOT DETECTED NOT DETECTED Final   Klebsiella oxytoca NOT DETECTED NOT DETECTED Final   Klebsiella pneumoniae NOT DETECTED NOT DETECTED Final   Proteus species NOT DETECTED NOT DETECTED Final   Salmonella species NOT DETECTED NOT DETECTED Final   Serratia marcescens NOT DETECTED NOT DETECTED Final   Haemophilus influenzae NOT DETECTED NOT DETECTED  Final   Neisseria meningitidis NOT DETECTED NOT DETECTED Final   Pseudomonas aeruginosa NOT DETECTED NOT DETECTED Final   Stenotrophomonas maltophilia NOT DETECTED NOT DETECTED Final   Candida albicans NOT DETECTED NOT DETECTED Final   Candida auris NOT DETECTED NOT DETECTED Final   Candida glabrata NOT DETECTED NOT DETECTED Final   Candida krusei NOT DETECTED NOT DETECTED Final   Candida parapsilosis NOT DETECTED NOT DETECTED Final   Candida tropicalis NOT DETECTED NOT DETECTED Final   Cryptococcus neoformans/gattii NOT DETECTED NOT DETECTED Final    Comment: Performed at [facility], [address], [place], [place] [address]  Blood Culture (routine x 2)     Status: Abnormal   Collection Time: 05/12/20  6:57 PM   Specimen: BLOOD  Result Value Ref Range Status   Specimen Description   Final    BLOOD LEFT ANTECUBITAL Performed at [facility], [address] [place], [place] [address]    Special Requests   Final    BOTTLES DRAWN AEROBIC AND ANAEROBIC Blood Culture adequate volume   Culture  Setup Time   Final    GRAM POSITIVE COCCI CRITICAL VALUE NOTED.  VALUE IS CONSISTENT WITH PREVIOUSLY REPORTED AND CALLED VALUE. IN BOTH AEROBIC AND ANAEROBIC BOTTLES    Culture (A)  Final    STREPTOCOCCUS GROUP G SUSCEPTIBILITIES PERFORMED ON PREVIOUS CULTURE WITHIN THE LAST 5 DAYS. Performed at [facility], [address], [place], [place] [address]    Report Status 05/17/2020 FINAL  Final  Resp Panel by RT-PCR (Flu A&B, Covid) Nasopharyngeal Swab     Status: None   Collection Time: 05/12/20  7:00 PM   Specimen: Nasopharyngeal Swab; Nasopharyngeal(NP) swabs in vial transport medium  Result Value Ref Range Status   SARS Coronavirus 2 by RT PCR NEGATIVE NEGATIVE Final    Comment: (NOTE) SARS-CoV-2 target nucleic acids are NOT DETECTED.  The SARS-CoV-2 RNA is generally detectable in upper respiratory specimens during the acute phase of infection. The  lowest concentration of SARS-CoV-2 viral copies this assay can detect is 138 copies/mL. A negative result does not preclude SARS-Cov-2 infection and should not be used as the sole basis for treatment or other patient management decisions. A negative result may occur with  improper specimen collection/handling, submission of specimen other than nasopharyngeal swab, presence of viral mutation(s) within the areas targeted by this assay, and inadequate number of viral copies(<138 copies/mL). A negative result must be combined with clinical observations, patient history, and epidemiological information. The expected result is Negative.  Fact Sheet  for Patients:  EntrepreneurPulse.com.au  Fact Sheet for Healthcare Providers:  IncredibleEmployment.be  This test is no t yet approved or cleared by the [place] FDA and  has been authorized for detection and/or diagnosis of SARS-CoV-2 by FDA under an Emergency Use Authorization (EUA). This EUA will remain  in effect (meaning this test can be used) for the duration of the COVID-19 declaration under Section 564(b)(1) of the Act, 21 U.S.C.section 360bbb-3(b)(1), unless the authorization is terminated  or revoked sooner.       Influenza A by PCR NEGATIVE NEGATIVE Final   Influenza B by PCR NEGATIVE NEGATIVE Final    Comment: (NOTE) The Xpert Xpress SARS-CoV-2/FLU/RSV plus assay is intended as an aid in the diagnosis of influenza from Nasopharyngeal swab specimens and should not be used as a sole basis for treatment. Nasal washings and aspirates are unacceptable for Xpert Xpress SARS-CoV-2/FLU/RSV testing.  Fact Sheet for Patients: EntrepreneurPulse.com.au  Fact Sheet for Healthcare Providers: IncredibleEmployment.be  This test is not yet approved or cleared by the [place] FDA and has been authorized for detection and/or diagnosis of SARS-CoV-2 by FDA under  an Emergency Use Authorization (EUA). This EUA will remain in effect (meaning this test can be used) for the duration of the COVID-19 declaration under Section 564(b)(1) of the Act, 21 U.S.C. section 360bbb-3(b)(1), unless the authorization is terminated or revoked.  Performed at [facility], [place] [address], [place], [place] [address]   Urine culture     Status: Abnormal   Collection Time: 05/12/20  8:45 PM   Specimen: In/Out Cath Urine  Result Value Ref Range Status   Specimen Description   Final    IN/OUT CATH URINE Performed at [facility] [address] [place], [place] [address]    Special Requests   Final    NONE Performed at [facility], [place] [address], [place], [place] [address]    Culture MULTIPLE SPECIES PRESENT, SUGGEST RECOLLECTION (A)  Final   Report Status 05/14/2020 FINAL  Final  MRSA PCR Screening     Status: None   Collection Time: 05/13/20  5:28 AM   Specimen: Nasopharyngeal  Result Value Ref Range Status   MRSA by PCR NEGATIVE NEGATIVE Final    Comment:        The GeneXpert MRSA Assay (FDA approved for NASAL specimens only), is one component of a comprehensive MRSA colonization surveillance program. It is not intended to diagnose MRSA infection nor to guide or monitor treatment for MRSA infections. Performed at [facility], [place] [address] [place], [place] [address]   Body fluid culture w Gram Stain     Status: None   Collection Time: 05/14/20  3:29 PM   Specimen: Joint, Hip; Body Fluid  Result Value Ref Range Status   Specimen Description   Final    HIP RIGHT Performed at [facility] [address] [place], [place] [address]    Special Requests   Final    NONE Performed at [facility], [place] [address] [place], [place] [address]    Gram Stain   Final    MODERATE WBC PRESENT,BOTH PMN AND MONONUCLEAR NO ORGANISMS SEEN     Culture   Final    NO GROWTH 3 DAYS Performed at [facility], [place] [address], [place], [place] [address]    Report Status 05/18/2020 FINAL  Final  Culture, blood (Routine X 2) w Reflex to ID Panel  Status: None (Preliminary result)   Collection Time: 05/18/20 10:36 AM   Specimen: BLOOD LEFT HAND  Result Value Ref Range Status   Specimen Description   Final    BLOOD LEFT HAND Performed at [facility], [address], [place], [place] [address]    Special Requests   Final    BOTTLES DRAWN AEROBIC ONLY Blood Culture adequate volume Performed at [facility], [address], [place], [place] [address]    Culture   Final    NO GROWTH 3 DAYS Performed at [facility], [address] [place], [place] [address]    Report Status PENDING  Incomplete  Culture, blood (Routine X 2) w Reflex to ID Panel     Status: None (Preliminary result)   Collection Time: 05/18/20 10:36 AM   Specimen: BLOOD LEFT HAND  Result Value Ref Range Status   Specimen Description   Final    BLOOD LEFT HAND Performed at [facility], [address], [place], [place] [address]    Special Requests   Final    BOTTLES DRAWN AEROBIC ONLY Blood Culture results may not be optimal due to an inadequate volume of blood received in culture bottles Performed at [facility], [address] [place], [place] [address]    Culture   Final    NO GROWTH 3 DAYS Performed at [facility], [address], [place], [place] [address]    Report Status PENDING  Incomplete         Radiology Studies: No results found.      Scheduled Meds: . sodium chloride   Intravenous Once  . chlorhexidine  15 mL Mouth Rinse BID  . Chlorhexidine Gluconate Cloth  6 each Topical Daily  . collagenase   Topical Daily  . darunavir-cobicistat  1 tablet Oral Daily  . dolutegravir  50 mg Oral Daily  . feeding supplement  237 mL Oral TID BM  .  hydrocortisone sod succinate (SOLU-CORTEF) inj  50 mg Intravenous Q6H  . insulin aspart  0-5 Units Subcutaneous QHS  . insulin aspart  0-9 Units Subcutaneous TID WC  . mouth rinse  15 mL Mouth Rinse q12n4p  . rilpivirine  25 mg Oral Q breakfast  . sulfamethoxazole-trimethoprim  1 tablet Oral Daily   Continuous Infusions: . cefTRIAXone (ROCEPHIN)  IV 2 g (05/21/20 1106)  . lactated ringers 75 mL/hr at 05/21/20 0508     LOS: 8 days    Time spent: 35 mins.    Cipriano Bunker, MD [facility]   If 7PM-7AM, please contact night-coverage

## 2020-05-21 NOTE — Progress Notes (Signed)
Santyl applied to sacrum wound as ordered. Foam dressing changed due to soilage. Minimal drainage noted from wound. Patient continues to have pain related to multiple wounds. Will continue to monitor.  

## 2020-05-22 LAB — BASIC METABOLIC PANEL
Anion gap: 4 — ABNORMAL LOW (ref 5–15)
BUN: 31 mg/dL — ABNORMAL HIGH (ref 6–20)
CO2: 23 mmol/L (ref 22–32)
Calcium: 7.9 mg/dL — ABNORMAL LOW (ref 8.9–10.3)
Chloride: 117 mmol/L — ABNORMAL HIGH (ref 98–111)
Creatinine, Ser: 1.36 mg/dL — ABNORMAL HIGH (ref 0.61–1.24)
GFR, Estimated: >60 mL/min (ref >60)
Glucose, Bld: 89 mg/dL (ref 70–99)
Potassium: 4.1 mmol/L (ref 3.5–5.1)
Sodium: 144 mmol/L (ref 135–145)

## 2020-05-22 LAB — CBC
HCT: 26.1 % — ABNORMAL LOW (ref 39.0–52.0)
Hemoglobin: 8.1 g/dL — ABNORMAL LOW (ref 13.0–17.0)
MCH: 35.2 pg — ABNORMAL HIGH (ref 26.0–34.0)
MCHC: 31 g/dL (ref 30.0–36.0)
MCV: 113.5 fL — ABNORMAL HIGH (ref 80.0–100.0)
Platelets: 161 10*3/uL (ref 150–400)
RBC: 2.3 MIL/uL — ABNORMAL LOW (ref 4.22–5.81)
RDW: 20.5 % — ABNORMAL HIGH (ref 11.5–15.5)
WBC: 7.4 10*3/uL (ref 4.0–10.5)
nRBC: 0.3 % — ABNORMAL HIGH (ref 0.0–0.2)

## 2020-05-22 LAB — GLUCOSE, CAPILLARY
Glucose-Capillary: 93 mg/dL (ref 70–99)
Glucose-Capillary: 157 mg/dL — ABNORMAL HIGH (ref 70–99)
Glucose-Capillary: 136 mg/dL — ABNORMAL HIGH (ref 70–99)
Glucose-Capillary: 94 mg/dL (ref 70–99)

## 2020-05-22 LAB — MAGNESIUM: Magnesium: 2.1 mg/dL (ref 1.7–2.4)

## 2020-05-22 LAB — PHOSPHORUS: Phosphorus: 4.1 mg/dL (ref 2.5–4.6)

## 2020-05-22 MED ORDER — HYDROCORTISONE NA SUCCINATE PF 100 MG IJ SOLR
50.0000 mg | Freq: Three times a day (TID) | INTRAMUSCULAR | Status: DC
  Administered 2020-05-22 – 2020-05-23 (×3): 50 mg via INTRAVENOUS
  Filled 2020-05-22 (×3): qty 2

## 2020-05-22 NOTE — Progress Notes (Signed)
PROGRESS NOTE    [name] [name]  [id number] DOB: March 05, 1960 DOA: 05/12/2020 PCP: [name], MD   Brief Narrative: This 60 years old male with past medical history significant for HIV, noncompliant with his medications, gout, DVT, rectal cancer, tobacco abuse presented in the ED after being found down.  Patient was admitted for sepsis and subdural hemorrhage.  Neurosurgery was consulted, states no intervention needed.  Findings were concerning for possible embolic ischemic infarcts.  TTE is negative.  Right hip MRI shows fluid collection s/p aspiration by IR,  blood cultures positive for group B streptococci.  ID recommended long-term IV antibiotics.   Significant hospital events:  5/4 Admit after being found down, sepis and SDH. CT ABD/Pelvis > no acute intra-abdominal process,  evidence of sacral decubitus, slight increase compared to prior but no underlying fluid collection, left hip prosthesis   5/5 CT Head > posterior parafalcine SDH.  Cefepime, vanco, flagyl. Remains confused, GP bacteremia on BCID. Family reported 1 wk hx of GI c/o's, fell landing on right hip, concerned he has been drinking more & difficulties with medication identification.  MRI R Hip > large fluid collection surrounding right hip arthroplasty with minimal peripheral enhancement, fluid extends along the femoral component within the medullary cavity of the proximal femur. Small soft tissue defect along the right aspect of the gluteal cleft, bilateral hydroceles. MRI Brain with multifocal acute to subacute ischemic infarcts involving the bilateral cerebral and cerebellar hemispheres, associated mild scattered petechial hemorrhage - possible central thromboembolic etiology. Small subdural hemorrhage without shift. Chronic small vessel disease. Limited TTE b/c of patient cooperation, but no evidence of vegetation, GI DD. ABX changed to ceftriaxone.   5/6 Aspiration of R hip. Ortho aware of findings.    5/8 More  alert, oriented to person. Remains on levophed gtt.   5/9 Cortisol assessed, 13.  Stress dose steroids initiated.   5/10 Remains on 72mcg levo, mental status significantly improved.  SSI added with steroids.   5/11 transfuse 1 unit PRBC, transition off levophed  5/13: TRH resumed care.    Assessment & Plan:   Principal Problem:   Septic shock ([place]) Active Problems:   Human immunodeficiency virus (HIV) disease ([place])   AKI (acute kidney injury) ([place])   Lactic acidosis   SDH (subdural hematoma) ([facility])   Hypothermia due to exposure   Cellulitis   Elevated brain natriuretic peptide (BNP) level   Volume overload   Dehydration   Pressure injury of skin   Subdural empyema   Streptococcal bacteremia   Malnutrition of moderate degree   Group G streptococcal bacteremia: Possible sources include Rt hip prosthesis with prior infection, skin/soft tissue.   He has hx of Streptococcal hip/psoas abscess.  MRI of Rt hip showed large fluid collection.   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   He underwent Rt hip aspiration,  culture negative but had 31K WBC. ID consulted recommended to continue Rocephin Previously seen by Dr. [name] with ortho as outpt Continue wound care for sacral wound  Relative adrenal insufficiency. Continue solu cortef 50 mg q6h on 5/12;  if hemodynamics remain stable, then can start to wean off on 5/13.  Subdural hematoma secondary to fall with head trauma monitor neuro status. Seems stable.   Anemia of chronic disease. Thrombocytopenia in setting of sepsis. Monitor CBC Transfuse for Hb < 7, PLT < 10K, or significant bleeding  Thrombocytopenia S/p platelets 5/7 with minimal improvement on  5/8  Monitor CBC monitor for evidence of bleeding   HIV. 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 Lymphopenia noted on CBC on admission with CD4 <35 Continue Prezcobix + Tivicay   Steroid Induced  Hyperglycemia Continue SSI  Moderate Malnutrition  Dysphagia. D3 diet, meds crushed in apple sauce Continue speech therapy  History of anal cancer, recent biopsy by Dr. Dema Severin negative for malignancy Continue supportive care   Deconditioning. - PT/OT  Goals of care. - palliative care consulted.   DVT prophylaxis: SCDs Code Status: Full code. Family Communication: No family at bedside. Disposition Plan:  Status is: Inpatient  Remains inpatient appropriate because:Inpatient level of care appropriate due to severity of illness   Dispo: The patient is from: Home              Anticipated d/c is to: Home              Patient currently is not medically stable to d/c.   Difficult to place patient No   Consultants:   PCCM  Procedures:   Antimicrobials:  Anti-infectives (From admission, onward)   Start     Dose/Rate Route Frequency Ordered Stop   05/21/20 1015  dolutegravir (TIVICAY) tablet 50 mg        50 mg Oral Daily 05/21/20 0920     05/21/20 0800  rilpivirine (EDURANT) tablet 25 mg        25 mg Oral Daily with breakfast 05/20/20 1107     05/20/20 1000  sulfamethoxazole-trimethoprim (BACTRIM DS) 800-160 MG per tablet 1 tablet        1 tablet Oral Daily 05/19/20 1223     05/17/20 1015  sulfamethoxazole-trimethoprim (BACTRIM DS) 800-160 MG per tablet 1 tablet  Status:  Discontinued        1 tablet Per Tube Daily 05/17/20 0924 05/19/20 1223   05/14/20 1945  darunavir-cobicistat (PREZCOBIX) 800-150 MG per tablet 1 tablet       Note to Pharmacy: Crush and give per NG tube   1 tablet Oral Daily 05/14/20 1857     05/14/20 1945  dolutegravir (TIVICAY) tablet 50 mg  Status:  Discontinued        50 mg Per Tube Daily 05/14/20 1857 05/21/20 0920   05/14/20 1500  darunavir-cobicistat (PREZCOBIX) 800-150 MG per tablet 1 tablet  Status:  Discontinued        1 tablet Oral Daily with breakfast 05/14/20 1414 05/14/20 1418   05/14/20 1500  dolutegravir (TIVICAY) tablet 50 mg   Status:  Discontinued        50 mg Per Tube Daily 05/14/20 1414 05/14/20 1418   05/13/20 1300  cefTRIAXone (ROCEPHIN) 2 g in sodium chloride 0.9 % 100 mL IVPB        2 g 200 mL/hr over 30 Minutes Intravenous Every 12 hours 05/13/20 1207     05/13/20 0930  cefTRIAXone (ROCEPHIN) 2 g in sodium chloride 0.9 % 100 mL IVPB  Status:  Discontinued        2 g 200 mL/hr over 30 Minutes Intravenous Every 24 hours 05/13/20 0924 05/13/20 1207   05/12/20 1845  ceFEPIme (MAXIPIME) 2 g in sodium chloride 0.9 % 100 mL IVPB        2 g 200 mL/hr over 30 Minutes Intravenous  Once 05/12/20 1832 05/12/20 2015   05/12/20 1845  metroNIDAZOLE (FLAGYL) IVPB 500 mg        500 mg 100 mL/hr over 60 Minutes Intravenous  Once 05/12/20 1832 05/12/20 2025  05/12/20 1845  vancomycin (VANCOCIN) IVPB 1000 mg/200 mL premix  Status:  Discontinued        1,000 mg 200 mL/hr over 60 Minutes Intravenous  Once 05/12/20 1832 05/12/20 1837   05/12/20 1845  vancomycin (VANCOREADY) IVPB 1500 mg/300 mL        1,500 mg 150 mL/hr over 120 Minutes Intravenous  Once 05/12/20 1838 05/12/20 2225      Subjective: Patient was seen and examined at bedside.  Overnight events noted.   Patient is lying comfortably on the bed,  denies any shortness of breath.   He reports pain in both legs has improved and remains controlled with pain medications.  Objective: Vitals:   05/21/20 0510 05/21/20 1426 05/21/20 2036 05/22/20 0539  BP: 140/77 138/68 130/78 132/70  Pulse: 66 71 70 (!) 59  Resp: 20  18 17   Temp: 97.7 F (36.5 C) 97.8 F (36.6 C) 98 F (36.7 C) 97.7 F (36.5 C)  TempSrc: Oral Oral Oral   SpO2: 100% 97% 99% 98%  Weight:      Height:        Intake/Output Summary (Last 24 hours) at 05/22/2020 1334 Last data filed at 05/22/2020 0900 Gross per 24 hour  Intake 1127.2 ml  Output 1250 ml  Net -122.8 ml   Filed Weights   05/19/20 0500 05/20/20 0500 05/21/20 0500  Weight: 73 kg 74.6 kg 76.8 kg    Examination:  General  exam: Appears calm and comfortable , not in any acute distress. Respiratory system: Clear to auscultation. Respiratory effort normal. Cardiovascular system: S1 & S2 heard, RRR. No JVD, murmurs, rubs, gallops or clicks. No pedal edema. Gastrointestinal system: Abdomen is nondistended, soft and nontender. No organomegaly or masses felt. Normal bowel sounds heard. Central nervous system: Alert and oriented. No focal neurological deficits. Extremities: 1+ pedal edema, no cyanosis no clubbing. Skin: Sacral wound noted.  Tenderness positive Psychiatry: Judgement and insight appear normal. Mood & affect appropriate.     Data Reviewed: I have personally reviewed following labs and imaging studies  CBC: Recent Labs  Lab 05/18/20 0327 05/19/20 0317 05/19/20 2033 05/20/20 0259 05/21/20 0553 05/22/20 0559  WBC 12.2* 13.6*  --  7.7 9.4 7.4  HGB 7.8* 7.2* 7.6* 7.8* 7.7* 8.1*  HCT 23.8* 21.7* 23.6* 24.4* 24.5* 26.1*  MCV 110.2* 109.6*  --  107.0* 110.4* 113.5*  PLT 63* 95*  --  102* 141* [id number]   Basic Metabolic Panel: Recent Labs  Lab 05/15/20 1659 05/16/20 0754 05/18/20 0327 05/19/20 0317 05/20/20 0259 05/21/20 0553 05/22/20 0559  NA  --    < > 137 140 142 144 144  K  --    < > 4.1 4.4 4.3 3.9 4.1  CL  --    < > 110 112* 115* 116* 117*  CO2  --    < > 22 22 24 25 23   GLUCOSE  --    < > 198* 184* 94 94 89  BUN  --    < > 52* 51* 43* 43* 31*  CREATININE  --    < > 1.33* 1.31* 1.27* 1.36* 1.36*  CALCIUM  --    < > 7.9* 7.9* 7.9* 7.8* 7.9*  MG 1.7  --   --   --   --   --  2.1  PHOS 3.2  --   --   --   --   --  4.1   < > = values in this interval not displayed.  GFR: Estimated Creatinine Clearance: 64.3 mL/min (A) (by C-G formula based on SCr of 1.36 mg/dL (H)). Liver Function Tests: Recent Labs  Lab 05/20/20 0259  AST 37  ALT 35  ALKPHOS 63  BILITOT 0.5  PROT 4.8*  ALBUMIN 1.7*   No results for input(s): LIPASE, AMYLASE in the last 168 hours. No results for input(s):  AMMONIA in the last 168 hours. Coagulation Profile: No results for input(s): INR, PROTIME in the last 168 hours. Cardiac Enzymes: No results for input(s): CKTOTAL, CKMB, CKMBINDEX, TROPONINI in the last 168 hours. BNP (last 3 results) No results for input(s): PROBNP in the last 8760 hours. HbA1C: No results for input(s): HGBA1C in the last 72 hours. CBG: Recent Labs  Lab 05/21/20 1217 05/21/20 1602 05/21/20 2147 05/22/20 0808 05/22/20 1200  GLUCAP 102* 87 95 93 157*   Lipid Profile: No results for input(s): CHOL, HDL, LDLCALC, TRIG, CHOLHDL, LDLDIRECT in the last 72 hours. Thyroid Function Tests: No results for input(s): TSH, T4TOTAL, FREET4, T3FREE, THYROIDAB in the last 72 hours. Anemia Panel: No results for input(s): VITAMINB12, FOLATE, FERRITIN, TIBC, IRON, RETICCTPCT in the last 72 hours. Sepsis Labs: Recent Labs  Lab 05/16/20 0305  PROCALCITON 2.85    Recent Results (from the past 240 hour(s))  Blood Culture (routine x 2)     Status: Abnormal   Collection Time: 05/12/20  6:45 PM   Specimen: BLOOD  Result Value Ref Range Status   Specimen Description   Final    BLOOD RIGHT WRIST Performed at [facility] [address], [place], [place] [address]    Special Requests   Final    BOTTLES DRAWN AEROBIC AND ANAEROBIC Blood Culture adequate volume   Culture  Setup Time   Final    GRAM POSITIVE COCCI IN BOTH AEROBIC AND ANAEROBIC BOTTLES CRITICAL RESULT CALLED TO, READ BACK BY AND VERIFIED WITH: PHARM D L.Wynona Canes ON DX:[id number] AT 0850 BY E.PARRISH Performed at [facility], [place] [address] [place], [place] [address]    Culture STREPTOCOCCUS GROUP G (A)  Final   Report Status 05/17/2020 FINAL  Final   Organism ID, Bacteria STREPTOCOCCUS GROUP G  Final      Susceptibility   Streptococcus group g - MIC*    CLINDAMYCIN <=0.25 SENSITIVE Sensitive     ERYTHROMYCIN <=0.12 SENSITIVE Sensitive     VANCOMYCIN 0.25 SENSITIVE Sensitive      CEFTRIAXONE <=0.12 SENSITIVE Sensitive     LEVOFLOXACIN 1 SENSITIVE Sensitive     * STREPTOCOCCUS GROUP G  Blood Culture ID Panel (Reflexed)     Status: Abnormal   Collection Time: 05/12/20  6:45 PM  Result Value Ref Range Status   Enterococcus faecalis NOT DETECTED NOT DETECTED Final   Enterococcus Faecium NOT DETECTED NOT DETECTED Final   Listeria monocytogenes NOT DETECTED NOT DETECTED Final   Staphylococcus species NOT DETECTED NOT DETECTED Final   Staphylococcus aureus (BCID) NOT DETECTED NOT DETECTED Final   Staphylococcus epidermidis NOT DETECTED NOT DETECTED Final   Staphylococcus lugdunensis NOT DETECTED NOT DETECTED Final   Streptococcus species DETECTED (A) NOT DETECTED Final    Comment: Not Enterococcus species, Streptococcus agalactiae, Streptococcus pyogenes, or Streptococcus pneumoniae. CRITICAL RESULT CALLED TO, READ BACK BY AND VERIFIED WITH: PHARM D L.POINDEXTER ON DX:[phone] AT 0850 BY E.PARRISH    Streptococcus agalactiae NOT DETECTED NOT DETECTED Final   Streptococcus pneumoniae NOT DETECTED NOT DETECTED Final   Streptococcus pyogenes NOT DETECTED NOT DETECTED Final   A.calcoaceticus-baumannii NOT DETECTED  NOT DETECTED Final   Bacteroides fragilis NOT DETECTED NOT DETECTED Final   Enterobacterales NOT DETECTED NOT DETECTED Final   Enterobacter cloacae complex NOT DETECTED NOT DETECTED Final   Escherichia coli NOT DETECTED NOT DETECTED Final   Klebsiella aerogenes NOT DETECTED NOT DETECTED Final   Klebsiella oxytoca NOT DETECTED NOT DETECTED Final   Klebsiella pneumoniae NOT DETECTED NOT DETECTED Final   Proteus species NOT DETECTED NOT DETECTED Final   Salmonella species NOT DETECTED NOT DETECTED Final   Serratia marcescens NOT DETECTED NOT DETECTED Final   Haemophilus influenzae NOT DETECTED NOT DETECTED Final   Neisseria meningitidis NOT DETECTED NOT DETECTED Final   Pseudomonas aeruginosa NOT DETECTED NOT DETECTED Final   Stenotrophomonas maltophilia NOT  DETECTED NOT DETECTED Final   Candida albicans NOT DETECTED NOT DETECTED Final   Candida auris NOT DETECTED NOT DETECTED Final   Candida glabrata NOT DETECTED NOT DETECTED Final   Candida krusei NOT DETECTED NOT DETECTED Final   Candida parapsilosis NOT DETECTED NOT DETECTED Final   Candida tropicalis NOT DETECTED NOT DETECTED Final   Cryptococcus neoformans/gattii NOT DETECTED NOT DETECTED Final    Comment: Performed at [facility], [place] [address] [place], [place] [address]  Blood Culture (routine x 2)     Status: Abnormal   Collection Time: 05/12/20  6:57 PM   Specimen: BLOOD  Result Value Ref Range Status   Specimen Description   Final    BLOOD LEFT ANTECUBITAL Performed at [facility] [address], [place], [place] [address]    Special Requests   Final    BOTTLES DRAWN AEROBIC AND ANAEROBIC Blood Culture adequate volume   Culture  Setup Time   Final    GRAM POSITIVE COCCI CRITICAL VALUE NOTED.  VALUE IS CONSISTENT WITH PREVIOUSLY REPORTED AND CALLED VALUE. IN BOTH AEROBIC AND ANAEROBIC BOTTLES    Culture (A)  Final    STREPTOCOCCUS GROUP G SUSCEPTIBILITIES PERFORMED ON PREVIOUS CULTURE WITHIN THE LAST 5 DAYS. Performed at [facility], [place] [address], [place], [place] [address]    Report Status 05/17/2020 FINAL  Final  Resp Panel by RT-PCR (Flu A&B, Covid) Nasopharyngeal Swab     Status: None   Collection Time: 05/12/20  7:00 PM   Specimen: Nasopharyngeal Swab; Nasopharyngeal(NP) swabs in vial transport medium  Result Value Ref Range Status   SARS Coronavirus 2 by RT PCR NEGATIVE NEGATIVE Final    Comment: (NOTE) SARS-CoV-2 target nucleic acids are NOT DETECTED.  The SARS-CoV-2 RNA is generally detectable in upper respiratory specimens during the acute phase of infection. The lowest concentration of SARS-CoV-2 viral copies this assay can detect is 138 copies/mL. A negative result does not preclude SARS-Cov-2 infection and should not  be used as the sole basis for treatment or other patient management decisions. A negative result may occur with  improper specimen collection/handling, submission of specimen other than nasopharyngeal swab, presence of viral mutation(s) within the areas targeted by this assay, and inadequate number of viral copies(<138 copies/mL). A negative result must be combined with clinical observations, patient history, and epidemiological information. The expected result is Negative.  Fact Sheet for Patients:  EntrepreneurPulse.com.au  Fact Sheet for Healthcare Providers:  IncredibleEmployment.be  This test is no t yet approved or cleared by the [place] FDA and  has been authorized for detection and/or diagnosis of SARS-CoV-2 by FDA under an Emergency Use Authorization (EUA). This EUA will remain  in effect (meaning this test can be  used) for the duration of the COVID-19 declaration under Section 564(b)(1) of the Act, 21 U.S.C.section 360bbb-3(b)(1), unless the authorization is terminated  or revoked sooner.       Influenza A by PCR NEGATIVE NEGATIVE Final   Influenza B by PCR NEGATIVE NEGATIVE Final    Comment: (NOTE) The Xpert Xpress SARS-CoV-2/FLU/RSV plus assay is intended as an aid in the diagnosis of influenza from Nasopharyngeal swab specimens and should not be used as a sole basis for treatment. Nasal washings and aspirates are unacceptable for Xpert Xpress SARS-CoV-2/FLU/RSV testing.  Fact Sheet for Patients: EntrepreneurPulse.com.au  Fact Sheet for Healthcare Providers: IncredibleEmployment.be  This test is not yet approved or cleared by the [place] FDA and has been authorized for detection and/or diagnosis of SARS-CoV-2 by FDA under an Emergency Use Authorization (EUA). This EUA will remain in effect (meaning this test can be used) for the duration of the COVID-19 declaration under Section  564(b)(1) of the Act, 21 U.S.C. section 360bbb-3(b)(1), unless the authorization is terminated or revoked.  Performed at [facility], [place] [address], [place], [place] [address]   Urine culture     Status: Abnormal   Collection Time: 05/12/20  8:45 PM   Specimen: In/Out Cath Urine  Result Value Ref Range Status   Specimen Description   Final    IN/OUT CATH URINE Performed at [facility] [address], [place], [place] [address]    Special Requests   Final    NONE Performed at [facility], [place] [address], [place], [place] [address]    Culture MULTIPLE SPECIES PRESENT, SUGGEST RECOLLECTION (A)  Final   Report Status 05/14/2020 FINAL  Final  MRSA PCR Screening     Status: None   Collection Time: 05/13/20  5:28 AM   Specimen: Nasopharyngeal  Result Value Ref Range Status   MRSA by PCR NEGATIVE NEGATIVE Final    Comment:        The GeneXpert MRSA Assay (FDA approved for NASAL specimens only), is one component of a comprehensive MRSA colonization surveillance program. It is not intended to diagnose MRSA infection nor to guide or monitor treatment for MRSA infections. Performed at [facility], [place] [address], [place], [place] [address]   Body fluid culture w Gram Stain     Status: None   Collection Time: 05/14/20  3:29 PM   Specimen: Joint, Hip; Body Fluid  Result Value Ref Range Status   Specimen Description   Final    HIP RIGHT Performed at [facility] [address] [place], [place] [address]    Special Requests   Final    NONE Performed at [facility], [place] [address] [place], [place] [address]    Gram Stain   Final    MODERATE WBC PRESENT,BOTH PMN AND MONONUCLEAR NO ORGANISMS SEEN    Culture   Final    NO GROWTH 3 DAYS Performed at [facility], [place] [address] [place], [place] [address]    Report Status 05/18/2020 FINAL  Final   Culture, blood (Routine X 2) w Reflex to ID Panel     Status: None (Preliminary result)   Collection Time: 05/18/20 10:36 AM   Specimen: BLOOD LEFT HAND  Result Value Ref Range Status   Specimen Description   Final    BLOOD LEFT HAND Performed at [facility] [address] [place], [place] [address]    Special Requests  Final    BOTTLES DRAWN AEROBIC ONLY Blood Culture adequate volume Performed at [facility] [address], [place], [place] [address]    Culture   Final    NO GROWTH 3 DAYS Performed at [facility], [place] [address] [place], [place] [address]    Report Status PENDING  Incomplete  Culture, blood (Routine X 2) w Reflex to ID Panel     Status: None (Preliminary result)   Collection Time: 05/18/20 10:36 AM   Specimen: BLOOD LEFT HAND  Result Value Ref Range Status   Specimen Description   Final    BLOOD LEFT HAND Performed at [facility] [address], [place], [place] [address]    Special Requests   Final    BOTTLES DRAWN AEROBIC ONLY Blood Culture results may not be optimal due to an inadequate volume of blood received in culture bottles Performed at [facility] [address] [place], [place] [address]    Culture   Final    NO GROWTH 3 DAYS Performed at [facility], [place] [address], [place], [place] [address]    Report Status PENDING  Incomplete         Radiology Studies: No results found.      Scheduled Meds: . sodium chloride   Intravenous Once  . chlorhexidine  15 mL Mouth Rinse BID  . Chlorhexidine Gluconate Cloth  6 each Topical Daily  . collagenase   Topical Daily  . darunavir-cobicistat  1 tablet Oral Daily  . dolutegravir  50 mg Oral Daily  . feeding supplement  237 mL Oral TID BM  . hydrocortisone sod succinate (SOLU-CORTEF) inj  50 mg Intravenous Q6H  . insulin aspart  0-5 Units Subcutaneous QHS  . insulin aspart  0-9 Units  Subcutaneous TID WC  . mouth rinse  15 mL Mouth Rinse q12n4p  . rilpivirine  25 mg Oral Q breakfast  . sulfamethoxazole-trimethoprim  1 tablet Oral Daily   Continuous Infusions: . cefTRIAXone (ROCEPHIN)  IV 2 g (05/22/20 0929)  . lactated ringers 75 mL/hr at 05/21/20 1705     LOS: 9 days    Time spent: 25 mins.    Shawna Clamp, MD [facility]   If 7PM-7AM, please contact night-coverage

## 2020-05-23 LAB — CULTURE, BLOOD (ROUTINE X 2)
Culture: NO GROWTH
Culture: NO GROWTH
Special Requests: ADEQUATE

## 2020-05-23 LAB — CBC
HCT: 25.8 % — ABNORMAL LOW (ref 39.0–52.0)
Hemoglobin: 8.2 g/dL — ABNORMAL LOW (ref 13.0–17.0)
MCH: 35 pg — ABNORMAL HIGH (ref 26.0–34.0)
MCHC: 31.8 g/dL (ref 30.0–36.0)
MCV: 110.3 fL — ABNORMAL HIGH (ref 80.0–100.0)
Platelets: 188 10*3/uL (ref 150–400)
RBC: 2.34 MIL/uL — ABNORMAL LOW (ref 4.22–5.81)
RDW: 19.9 % — ABNORMAL HIGH (ref 11.5–15.5)
WBC: 9.9 10*3/uL (ref 4.0–10.5)
nRBC: 0 % (ref 0.0–0.2)

## 2020-05-23 LAB — BASIC METABOLIC PANEL
Anion gap: 3 — ABNORMAL LOW (ref 5–15)
BUN: 30 mg/dL — ABNORMAL HIGH (ref 6–20)
CO2: 22 mmol/L (ref 22–32)
Calcium: 7.7 mg/dL — ABNORMAL LOW (ref 8.9–10.3)
Chloride: 119 mmol/L — ABNORMAL HIGH (ref 98–111)
Creatinine, Ser: 1.16 mg/dL (ref 0.61–1.24)
GFR, Estimated: >60 mL/min (ref >60)
Glucose, Bld: 100 mg/dL — ABNORMAL HIGH (ref 70–99)
Potassium: 4.1 mmol/L (ref 3.5–5.1)
Sodium: 144 mmol/L (ref 135–145)

## 2020-05-23 LAB — MAGNESIUM: Magnesium: 2 mg/dL (ref 1.7–2.4)

## 2020-05-23 LAB — GLUCOSE, CAPILLARY
Glucose-Capillary: 81 mg/dL (ref 70–99)
Glucose-Capillary: 113 mg/dL — ABNORMAL HIGH (ref 70–99)
Glucose-Capillary: 146 mg/dL — ABNORMAL HIGH (ref 70–99)
Glucose-Capillary: 130 mg/dL — ABNORMAL HIGH (ref 70–99)

## 2020-05-23 LAB — PHOSPHORUS: Phosphorus: 3.4 mg/dL (ref 2.5–4.6)

## 2020-05-23 MED ORDER — HYDROCORTISONE NA SUCCINATE PF 100 MG IJ SOLR
50.0000 mg | Freq: Two times a day (BID) | INTRAMUSCULAR | Status: DC
  Administered 2020-05-24: 50 mg via INTRAVENOUS
  Filled 2020-05-23: qty 2

## 2020-05-23 NOTE — Progress Notes (Signed)
PROGRESS NOTE    [name] [name]  BZJ:[id number] DOB: [birth year] DOA: 05/12/2020 PCP: [name], MD   Brief Narrative: This 60 years old male with past medical history significant for HIV, noncompliant with his medications, gout, DVT, rectal cancer, tobacco abuse presented in the ED after being found down.  Patient was admitted for sepsis and subdural hemorrhage.  Neurosurgery was consulted, states no intervention needed.  Findings were concerning for possible embolic ischemic infarcts.  TTE is negative.  Right hip MRI shows fluid collection s/p aspiration by IR,  blood cultures positive for group B streptococci.  ID recommended long-term IV antibiotics.   Significant hospital events:  5/4 Admit after being found down, sepis and SDH. CT ABD/Pelvis > no acute intra-abdominal process,  evidence of sacral decubitus, slight increase compared to prior but no underlying fluid collection, left hip prosthesis   5/5 CT Head > posterior parafalcine SDH.  Cefepime, vanco, flagyl. Remains confused, GP bacteremia on BCID. Family reported 1 wk hx of GI c/o's, fell landing on right hip, concerned he has been drinking more & difficulties with medication identification.  MRI R Hip > large fluid collection surrounding right hip arthroplasty with minimal peripheral enhancement, fluid extends along the femoral component within the medullary cavity of the proximal femur. Small soft tissue defect along the right aspect of the gluteal cleft, bilateral hydroceles. MRI Brain with multifocal acute to subacute ischemic infarcts involving the bilateral cerebral and cerebellar hemispheres, associated mild scattered petechial hemorrhage - possible central thromboembolic etiology. Small subdural hemorrhage without shift. Chronic small vessel disease. Limited TTE b/c of patient cooperation, but no evidence of vegetation, GI DD. ABX changed to ceftriaxone.   5/6 Aspiration of R hip. Ortho aware of findings.    5/8 More  alert, oriented to person. Remains on levophed gtt.   5/9 Cortisol assessed, 13.  Stress dose steroids initiated.   5/10 Remains on 59mcg levo, mental status significantly improved.  SSI added with steroids.   5/11 transfuse 1 unit PRBC, transition off levophed  5/13: TRH resumed care.    Assessment & Plan:   Principal Problem:   Septic shock ([place]) Active Problems:   Human immunodeficiency virus (HIV) disease ([place])   AKI (acute kidney injury) ([place])   Lactic acidosis   SDH (subdural hematoma) ([facility])   Hypothermia due to exposure   Cellulitis   Elevated brain natriuretic peptide (BNP) level   Volume overload   Dehydration   Pressure injury of skin   Subdural empyema   Streptococcal bacteremia   Malnutrition of moderate degree   Group G streptococcal bacteremia: Possible sources include Rt hip prosthesis with prior infection, skin/soft tissue.   He has hx. of Streptococcal hip/psoas abscess.  MRI of Rt hip showed large fluid collection.   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   He underwent Rt hip aspiration,  culture negative but had 31K WBC. ID consulted recommended to continue Rocephin Previously seen by Dr. [name] with ortho as outpt Continue wound care for sacral wound.  Relative adrenal insufficiency. Continue solu cortef 50 mg q6h on 5/12;  if hemodynamics remain stable, then can start to wean off on 5/13. Solu-Cortef 50 mg changed to every 12 hours.  Subdural hematoma secondary to fall with head trauma monitor neuro status. Seems stable.   Anemia of chronic disease. Thrombocytopenia in setting of sepsis. Monitor CBC Transfuse for Hb < 7, PLT < 10K, or significant bleeding  Thrombocytopenia S/p platelets 5/7 with minimal improvement on 5/8  Monitor CBC monitor for evidence of bleeding   HIV. 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 Lymphopenia noted on CBC on admission with CD4 <35 Continue  Prezcobix + Tivicay   Steroid Induced Hyperglycemia Continue SSI  Moderate Malnutrition  Dysphagia. D3 diet, meds crushed in apple sauce Continue speech therapy  History of anal cancer, recent biopsy by Dr. Dema Severin negative for malignancy Continue supportive care   Deconditioning. - PT/OT  Goals of care. - palliative care consulted.   DVT prophylaxis: SCDs Code Status: Full code. Family Communication: No family at bedside. Disposition Plan:  Status is: Inpatient  Remains inpatient appropriate because:Inpatient level of care appropriate due to severity of illness   Dispo: The patient is from: Home              Anticipated d/c is to: Home              Patient currently is not medically stable to d/c.   Difficult to place patient No   Consultants:   PCCM  Procedures:   Antimicrobials:  Anti-infectives (From admission, onward)   Start     Dose/Rate Route Frequency Ordered Stop   05/21/20 1015  dolutegravir (TIVICAY) tablet 50 mg        50 mg Oral Daily 05/21/20 0920     05/21/20 0800  rilpivirine (EDURANT) tablet 25 mg        25 mg Oral Daily with breakfast 05/20/20 1107     05/20/20 1000  sulfamethoxazole-trimethoprim (BACTRIM DS) 800-160 MG per tablet 1 tablet        1 tablet Oral Daily 05/19/20 1223     05/17/20 1015  sulfamethoxazole-trimethoprim (BACTRIM DS) 800-160 MG per tablet 1 tablet  Status:  Discontinued        1 tablet Per Tube Daily 05/17/20 0924 05/19/20 1223   05/14/20 1945  darunavir-cobicistat (PREZCOBIX) 800-150 MG per tablet 1 tablet       Note to Pharmacy: Crush and give per NG tube   1 tablet Oral Daily 05/14/20 1857     05/14/20 1945  dolutegravir (TIVICAY) tablet 50 mg  Status:  Discontinued        50 mg Per Tube Daily 05/14/20 1857 05/21/20 0920   05/14/20 1500  darunavir-cobicistat (PREZCOBIX) 800-150 MG per tablet 1 tablet  Status:  Discontinued        1 tablet Oral Daily with breakfast 05/14/20 1414 05/14/20 1418   05/14/20 1500   dolutegravir (TIVICAY) tablet 50 mg  Status:  Discontinued        50 mg Per Tube Daily 05/14/20 1414 05/14/20 1418   05/13/20 1300  cefTRIAXone (ROCEPHIN) 2 g in sodium chloride 0.9 % 100 mL IVPB        2 g 200 mL/hr over 30 Minutes Intravenous Every 12 hours 05/13/20 1207     05/13/20 0930  cefTRIAXone (ROCEPHIN) 2 g in sodium chloride 0.9 % 100 mL IVPB  Status:  Discontinued        2 g 200 mL/hr over 30 Minutes Intravenous Every 24 hours 05/13/20 0924 05/13/20 1207   05/12/20 1845  ceFEPIme (MAXIPIME) 2 g in sodium chloride 0.9 % 100 mL IVPB        2 g 200 mL/hr over 30 Minutes Intravenous  Once 05/12/20 1832 05/12/20 2015   05/12/20 1845  metroNIDAZOLE (FLAGYL) IVPB 500 mg        500 mg 100 mL/hr over 60  Minutes Intravenous  Once 05/12/20 1832 05/12/20 2025   05/12/20 1845  vancomycin (VANCOCIN) IVPB 1000 mg/200 mL premix  Status:  Discontinued        1,000 mg 200 mL/hr over 60 Minutes Intravenous  Once 05/12/20 1832 05/12/20 1837   05/12/20 1845  vancomycin (VANCOREADY) IVPB 1500 mg/300 mL        1,500 mg 150 mL/hr over 120 Minutes Intravenous  Once 05/12/20 1838 05/12/20 2225      Subjective: Patient was seen and examined at bedside.  Overnight events noted.   Patient is lying comfortably on the bed,  denies any shortness of breath.   Patient reports pain is better controlled with pain medications.  Objective: Vitals:   05/22/20 1336 05/22/20 2057 05/23/20 0453 05/23/20 1343  BP: (!) 145/70 (!) 160/79 (!) 154/89 128/70  Pulse: 67 60 66 63  Resp: 18 20 20 15   Temp: 98.1 F (36.7 C) 98.1 F (36.7 C) 97.8 F (36.6 C) 98.1 F (36.7 C)  TempSrc: Oral Oral    SpO2: 98% 95% 95% 99%  Weight:      Height:        Intake/Output Summary (Last 24 hours) at 05/23/2020 1425 Last data filed at 05/23/2020 0900 Gross per 24 hour  Intake 480 ml  Output 251 ml  Net 229 ml   Filed Weights   05/19/20 0500 05/20/20 0500 05/21/20 0500  Weight: 73 kg 74.6 kg 76.8 kg     Examination:  General exam: Appears calm and comfortable , not in any acute distress. Respiratory system: Clear to auscultation. Respiratory effort normal. Cardiovascular system: S1 & S2 heard, RRR. No JVD, murmurs, rubs, gallops or clicks. No pedal edema. Gastrointestinal system: Abdomen is nondistended, soft and nontender. No organomegaly or masses felt. Normal bowel sounds heard. Central nervous system: Alert and oriented. No focal neurological deficits. Extremities: 1+ pedal edema, no cyanosis no clubbing. Skin: Sacral wound noted.  Tenderness positive Psychiatry: Judgement and insight appear normal. Mood & affect appropriate.     Data Reviewed: I have personally reviewed following labs and imaging studies  CBC: Recent Labs  Lab 05/19/20 0317 05/19/20 2033 05/20/20 0259 05/21/20 0553 05/22/20 0559 05/23/20 0503  WBC 13.6*  --  7.7 9.4 7.4 9.9  HGB 7.2* 7.6* 7.8* 7.7* 8.1* 8.2*  HCT 21.7* 23.6* 24.4* 24.5* 26.1* 25.8*  MCV 109.6*  --  107.0* 110.4* 113.5* 110.3*  PLT 95*  --  102* 141* 161 0000000   Basic Metabolic Panel: Recent Labs  Lab 05/19/20 0317 05/20/20 0259 05/21/20 0553 05/22/20 0559 05/23/20 0503  NA 140 142 144 144 144  K 4.4 4.3 3.9 4.1 4.1  CL 112* 115* 116* 117* 119*  CO2 22 24 25 23 22   GLUCOSE 184* 94 94 89 100*  BUN 51* 43* 43* 31* 30*  CREATININE 1.31* 1.27* 1.36* 1.36* 1.16  CALCIUM 7.9* 7.9* 7.8* 7.9* 7.7*  MG  --   --   --  2.1 2.0  PHOS  --   --   --  4.1 3.4   GFR: Estimated Creatinine Clearance: 75.4 mL/min (by C-G formula based on SCr of 1.16 mg/dL). Liver Function Tests: Recent Labs  Lab 05/20/20 0259  AST 37  ALT 35  ALKPHOS 63  BILITOT 0.5  PROT 4.8*  ALBUMIN 1.7*   No results for input(s): LIPASE, AMYLASE in the last 168 hours. No results for input(s): AMMONIA in the last 168 hours. Coagulation Profile: No results for input(s): INR, PROTIME in  the last 168 hours. Cardiac Enzymes: No results for input(s): CKTOTAL,  CKMB, CKMBINDEX, TROPONINI in the last 168 hours. BNP (last 3 results) No results for input(s): PROBNP in the last 8760 hours. HbA1C: No results for input(s): HGBA1C in the last 72 hours. CBG: Recent Labs  Lab 05/22/20 1200 05/22/20 1752 05/22/20 2100 05/23/20 0737 05/23/20 1154  GLUCAP 157* 136* 94 81 113*   Lipid Profile: No results for input(s): CHOL, HDL, LDLCALC, TRIG, CHOLHDL, LDLDIRECT in the last 72 hours. Thyroid Function Tests: No results for input(s): TSH, T4TOTAL, FREET4, T3FREE, THYROIDAB in the last 72 hours. Anemia Panel: No results for input(s): VITAMINB12, FOLATE, FERRITIN, TIBC, IRON, RETICCTPCT in the last 72 hours. Sepsis Labs: No results for input(s): PROCALCITON, LATICACIDVEN in the last 168 hours.  Recent Results (from the past 240 hour(s))  Body fluid culture w Gram Stain     Status: None   Collection Time: 05/14/20  3:29 PM   Specimen: Joint, Hip; Body Fluid  Result Value Ref Range Status   Specimen Description   Final    HIP RIGHT Performed at [facility] [address] [place], [place] [address]    Special Requests   Final    NONE Performed at [facility], [place] [address], [place], [place] [address]    Gram Stain   Final    MODERATE WBC PRESENT,BOTH PMN AND MONONUCLEAR NO ORGANISMS SEEN    Culture   Final    NO GROWTH 3 DAYS Performed at [facility], [place] [address] [place], [place] [address]    Report Status 05/18/2020 FINAL  Final  Culture, blood (Routine X 2) w Reflex to ID Panel     Status: None   Collection Time: 05/18/20 10:36 AM   Specimen: BLOOD LEFT HAND  Result Value Ref Range Status   Specimen Description   Final    BLOOD LEFT HAND Performed at [facility] [address] [place], [place] [address]    Special Requests   Final    BOTTLES DRAWN AEROBIC ONLY Blood Culture adequate volume Performed at [facility] [address],  [place], [place] [address]    Culture   Final    NO GROWTH 5 DAYS Performed at [facility], [place] [address] [place], [place] [address]    Report Status 05/23/2020 FINAL  Final  Culture, blood (Routine X 2) w Reflex to ID Panel     Status: None   Collection Time: 05/18/20 10:36 AM   Specimen: BLOOD LEFT HAND  Result Value Ref Range Status   Specimen Description   Final    BLOOD LEFT HAND Performed at [facility] [address], [place], [place] [address]    Special Requests   Final    BOTTLES DRAWN AEROBIC ONLY Blood Culture results may not be optimal due to an inadequate volume of blood received in culture bottles Performed at [facility] [address] [place], [place] [address]    Culture   Final    NO GROWTH 5 DAYS Performed at [facility], [place] [address] [place], [place] [address]    Report Status 05/23/2020 FINAL  Final         Radiology Studies: No results found.      Scheduled Meds: . sodium chloride   Intravenous Once  . chlorhexidine  15 mL Mouth Rinse BID  . Chlorhexidine Gluconate Cloth  6 each  Topical Daily  . collagenase   Topical Daily  . darunavir-cobicistat  1 tablet Oral Daily  . dolutegravir  50 mg Oral Daily  . feeding supplement  237 mL Oral TID BM  . hydrocortisone sod succinate (SOLU-CORTEF) inj  50 mg Intravenous Q8H  . insulin aspart  0-5 Units Subcutaneous QHS  . insulin aspart  0-9 Units Subcutaneous TID WC  . mouth rinse  15 mL Mouth Rinse q12n4p  . rilpivirine  25 mg Oral Q breakfast  . sulfamethoxazole-trimethoprim  1 tablet Oral Daily   Continuous Infusions: . cefTRIAXone (ROCEPHIN)  IV 2 g (05/23/20 0919)  . lactated ringers 75 mL/hr at 05/21/20 1705     LOS: 10 days    Time spent: 25 mins.    Shawna Clamp, MD [facility]   If 7PM-7AM, please contact night-coverage

## 2020-05-24 ENCOUNTER — Inpatient Hospital Stay: Payer: Self-pay

## 2020-05-24 DIAGNOSIS — A419 Sepsis, unspecified organism: Secondary | ICD-10-CM | POA: Diagnosis not present

## 2020-05-24 DIAGNOSIS — R6521 Severe sepsis with septic shock: Secondary | ICD-10-CM | POA: Diagnosis not present

## 2020-05-24 LAB — GLUCOSE, CAPILLARY
Glucose-Capillary: 100 mg/dL — ABNORMAL HIGH (ref 70–99)
Glucose-Capillary: 126 mg/dL — ABNORMAL HIGH (ref 70–99)
Glucose-Capillary: 133 mg/dL — ABNORMAL HIGH (ref 70–99)
Glucose-Capillary: 93 mg/dL (ref 70–99)

## 2020-05-24 LAB — HEMOGLOBIN AND HEMATOCRIT, BLOOD
HCT: 26.2 % — ABNORMAL LOW (ref 39.0–52.0)
Hemoglobin: 8 g/dL — ABNORMAL LOW (ref 13.0–17.0)

## 2020-05-24 LAB — BASIC METABOLIC PANEL
Anion gap: 9 (ref 5–15)
BUN: 27 mg/dL — ABNORMAL HIGH (ref 6–20)
CO2: 22 mmol/L (ref 22–32)
Calcium: 7.8 mg/dL — ABNORMAL LOW (ref 8.9–10.3)
Chloride: 114 mmol/L — ABNORMAL HIGH (ref 98–111)
Creatinine, Ser: 1.02 mg/dL (ref 0.61–1.24)
GFR, Estimated: >60 mL/min (ref >60)
Glucose, Bld: 102 mg/dL — ABNORMAL HIGH (ref 70–99)
Potassium: 4 mmol/L (ref 3.5–5.1)
Sodium: 145 mmol/L (ref 135–145)

## 2020-05-24 MED ORDER — HYDROCORTISONE NA SUCCINATE PF 100 MG IJ SOLR
50.0000 mg | Freq: Every day | INTRAMUSCULAR | Status: DC
  Administered 2020-05-24 – 2020-05-26 (×3): 50 mg via INTRAVENOUS
  Filled 2020-05-24 (×3): qty 2

## 2020-05-24 NOTE — Progress Notes (Signed)
Physical Therapy Treatment Patient Details Name: [name] [name] MRN: [id number] DOB: 11/25/1960 Today's Date: 05/24/2020    History of Present Illness Patient is a 60 year old male admitted 5/4 after being found down by family. Admitted with sepsis, SDH from fall,H/O R hip replacement and multiple I and Ds, multiple wound sites. MRI reading multifocal acute to subacute ischemic infarcts involving the bilateral cerebral and cerebellar hemispheres,  PMH significant for HIV, ETOH use, Rectal cancer, tobacco use    PT Comments    Pt admitted with above diagnosis. Pt was able to perform UE and LE exercises with assist with less pain than in previous session. Asked MD for order for tilt bed as feel that this is best option for pt to incr weight bearing on feet/exercise LEs and MD agreed.  Also asked MD for wound care consult for sacral ulcer as when cleaning pt, felt that wound care consult would be beneficial.  Will continue to follow pt acutely.  Pt currently with functional limitations due to the deficits listed below (see PT Problem List). Pt will benefit from skilled PT to increase their independence and safety with mobility to allow discharge to the venue listed below.     Follow Up Recommendations  SNF     Equipment Recommendations  None recommended by PT    Recommendations for Other Services       Precautions / Restrictions Precautions Precautions: Fall Precaution Comments: multiple wound sites, very painful with minimal movement of right  UE, R ankle, R hip Restrictions Weight Bearing Restrictions: No    Mobility  Bed Mobility Overal bed mobility: Needs Assistance Bed Mobility: Rolling Rolling: Max assist;Total assist;+2 for physical assistance         General bed mobility comments: max assist to roll, pt was soiled and was total assist to clean pt. Called nursing in to put a new dressing on wound.  total A  sliding up in bed    Transfers                 General  transfer comment: unable, determined that gettig to chair was not a good idea for pt therefore performed exercises in bed.  Asked doctor for tilt bed order and MD agreed.  Ambulation/Gait                 Stairs             Wheelchair Mobility    Modified Rankin (Stroke Patients Only)       Balance Overall balance assessment: History of Falls                                          Cognition Arousal/Alertness: Awake/alert Behavior During Therapy: WFL for tasks assessed/performed Overall Cognitive Status: Impaired/Different from baseline                                 General Comments: patient oriented to place, patient very distractible by extraneous environmental stimuli, needs increased time to process/answer questions.      Exercises General Exercises - Upper Extremity Shoulder Flexion: AROM;Both;10 reps;Supine Shoulder Extension: AROM;Both;10 reps;Supine Elbow Flexion: AROM;Both;10 reps;Supine Elbow Extension: AROM;Both;10 reps;Supine General Exercises - Lower Extremity Ankle Circles/Pumps: AROM;Both;10 reps;Supine Quad Sets: AROM;Both;10 reps;Supine Gluteal Sets: AROM;Both;10 reps;Supine Heel Slides: AAROM;Both;10 reps;Supine Other Exercises Other Exercises:  educate/strongly encourage patient to perform fist pumps and working on flex/ext of elbow including lifting off bed as tolerated    General Comments        Pertinent Vitals/Pain Pain Assessment: Faces Faces Pain Scale: Hurts whole lot Pain Location: LEs, with rolling incr pain, sacral area Pain Descriptors / Indicators: Moaning;Grimacing Pain Intervention(s): Limited activity within patient's tolerance;Monitored during session;Repositioned    Home Living                      Prior Function            PT Goals (current goals can now be found in the care plan section) Acute Rehab PT Goals Patient Stated Goal: "I was walking with a  walker" Progress towards PT goals: Progressing toward goals (Seemed to be in less pain today)    Frequency    Min 2X/week      PT Plan Current plan remains appropriate    Co-evaluation              AM-PAC PT "6 Clicks" Mobility   Outcome Measure  Help needed turning from your back to your side while in a flat bed without using bedrails?: Total Help needed moving from lying on your back to sitting on the side of a flat bed without using bedrails?: Total Help needed moving to and from a bed to a chair (including a wheelchair)?: Total Help needed standing up from a chair using your arms (e.g., wheelchair or bedside chair)?: Total Help needed to walk in hospital room?: Total Help needed climbing 3-5 steps with a railing? : Total 6 Click Score: 6    End of Session   Activity Tolerance: Patient limited by pain Patient left: in bed;with call bell/phone within reach;with family/visitor present (sister present initially) Nurse Communication: Mobility status;Need for lift equipment (need for tilt bed to incr mobility) PT Visit Diagnosis: Difficulty in walking, not elsewhere classified (R26.2);Pain Pain - Right/Left: Right Pain - part of body: Shoulder;Hip;Ankle and joints of foot     Time: 1242-1315 PT Time Calculation (min) (ACUTE ONLY): 33 min  Charges:  $Therapeutic Exercise: 8-22 mins $Self Care/Home Management: 8-22                     [facility] M,PT Acute Rehab Services [phone] (pager)   Alvira Philips 05/24/2020, 3:51 PM

## 2020-05-24 NOTE — Progress Notes (Addendum)
[facility] for Infectious Disease   Reason for visit: Follow up on HIV, bacteremia  Interval History: remains afebrile; no acute events.  Repeat blood cultures remain ngtd.   Day 13 total antibiotics Day 12 ceftriaxone high dose Day 8 Bactrim prophylaxis  Physical Exam: Constitutional:  Vitals:   05/23/20 2034 05/24/20 0549  BP: (!) 155/85 (!) 154/86  Pulse: 66 66  Resp: 18 17  Temp: 97.8 F (36.6 C) 98.4 F (36.9 C)  SpO2: 100% 99%   patient appears in NAD Respiratory: Normal respiratory effort; CTA B Cardiovascular: RRR GI: soft, nt, nd MS: left leg with a small area of darkened lesions on the mid anterior lower part of the leg Multiple wounds of bilateral feet, upper extremities  Review of Systems: Constitutional: negative for fevers and chills Gastrointestinal: negative for nausea and diarrhea Integument/breast: negative for rash  Lab Results  Component Value Date   WBC 9.9 05/23/2020   HGB 8.0 (L) 05/24/2020   HCT 26.2 (L) 05/24/2020   MCV 110.3 (H) 05/23/2020   PLT 188 05/23/2020    Lab Results  Component Value Date   CREATININE 1.02 05/24/2020   BUN 27 (H) 05/24/2020   NA 145 05/24/2020   K 4.0 05/24/2020   CL 114 (H) 05/24/2020   CO2 22 05/24/2020    Lab Results  Component Value Date   ALT 35 05/20/2020   AST 37 05/20/2020   ALKPHOS 63 05/20/2020     Microbiology: Recent Results (from the past 240 hour(s))  Body fluid culture w Gram Stain     Status: None   Collection Time: 05/14/20  3:29 PM   Specimen: Joint, Hip; Body Fluid  Result Value Ref Range Status   Specimen Description   Final    HIP RIGHT Performed at [facility] [address] [place], [place] [address]    Special Requests   Final    NONE Performed at [facility], [place] [address] [place], [place] [address]    Gram Stain   Final    MODERATE WBC PRESENT,BOTH PMN AND MONONUCLEAR NO ORGANISMS SEEN    Culture   Final    NO  GROWTH 3 DAYS Performed at [facility], [place] [address] [place], [place] [address]    Report Status 05/18/2020 FINAL  Final  Culture, blood (Routine X 2) w Reflex to ID Panel     Status: None   Collection Time: 05/18/20 10:36 AM   Specimen: BLOOD LEFT HAND  Result Value Ref Range Status   Specimen Description   Final    BLOOD LEFT HAND Performed at [facility] [address], [place], [place] [address]    Special Requests   Final    BOTTLES DRAWN AEROBIC ONLY Blood Culture adequate volume Performed at [facility] [address] [place], [place] [address]    Culture   Final    NO GROWTH 5 DAYS Performed at [facility], [place] [address] [place], [place] [address]    Report Status 05/23/2020 FINAL  Final  Culture, blood (Routine X 2) w Reflex to ID Panel     Status: None   Collection Time: 05/18/20 10:36 AM   Specimen: BLOOD LEFT HAND  Result Value Ref Range Status   Specimen Description   Final    BLOOD LEFT HAND Performed at [facility] [address] [place],  [address]    Special Requests  Final    BOTTLES DRAWN AEROBIC ONLY Blood Culture results may not be optimal due to an inadequate volume of blood received in culture bottles Performed at [facility], [place] [address] [place], [place] [address]    Culture   Final    NO GROWTH 5 DAYS Performed at [facility], [place] [address], [place], [place] [address]    Report Status 05/23/2020 FINAL  Final    Impression/Plan:  1. Fluid collection around right hip arthroplasty in the setting of bacteremia - followed by Dr. [name] and is reportedly aware of his admission.  He will need close follow up with Dr. [name] for further monitoring of fluid collection around hip.   Culture from hip aspiration remained negative.  ? If it should be rescanned for reevaluation compared to 5/5 MRI  2.  HIV - CD4 down to < 35 and 107 on two  different assays done during this hospitalization.  Now back on his ARVs and will recheck his viral load and CD4 count at clinic follow up.   Continue with Prezcobix, Tivicay and rilpivirine  3.  OI risk - on Bactrim prophylaxis.  4.  Septic emboli to brain - Strep group G on blood cultures.  On antibiotics as above for a prolonged period through June 20th.   Will order picc line.  Continues to have some confusion  5.  Leg/feet lesions - ? Kaposis.  ? If this can be biopsied

## 2020-05-24 NOTE — Care Management Important Message (Signed)
Important Message  Patient Details IM Letter given to the Patient. Name: [name] [name] MRN: [id number] Date of Birth: 1960/10/26   Medicare Important Message Given:  Yes     [name] 05/24/2020, 9:44 AM

## 2020-05-24 NOTE — Progress Notes (Signed)
Called [name] (sister) regarding PICC placement and she decided to think about it overnight. IV team will follow up in am.

## 2020-05-24 NOTE — Progress Notes (Signed)
PROGRESS NOTE    BLESS BELSHE  KKX:[id number] DOB: 1960/09/19 DOA: 05/12/2020 PCP: [name], MD   Brief Narrative: This 60 years old male with past medical history significant for HIV, noncompliant with his medications, gout, DVT, rectal cancer, tobacco abuse presented in the ED after being found down.  Patient was admitted for sepsis and subdural hemorrhage.  Neurosurgery was consulted, states no intervention needed.  Findings were concerning for possible embolic ischemic infarcts.  TTE is negative.  Right hip MRI shows fluid collection s/p aspiration by IR,  blood cultures positive for group B streptococci.  ID recommended long-term IV antibiotics.   Significant hospital events:  5/4 Admit after being found down, sepis and SDH. CT ABD/Pelvis > no acute intra-abdominal process,  evidence of sacral decubitus, slight increase compared to prior but no underlying fluid collection, left hip prosthesis   5/5 CT Head > posterior parafalcine SDH.  Cefepime, vanco, flagyl. Remains confused, GP bacteremia on BCID. Family reported 1 wk hx of GI c/o's, fell landing on right hip, concerned he has been drinking more & difficulties with medication identification.  MRI R Hip > large fluid collection surrounding right hip arthroplasty with minimal peripheral enhancement, fluid extends along the femoral component within the medullary cavity of the proximal femur. Small soft tissue defect along the right aspect of the gluteal cleft, bilateral hydroceles. MRI Brain with multifocal acute to subacute ischemic infarcts involving the bilateral cerebral and cerebellar hemispheres, associated mild scattered petechial hemorrhage - possible central thromboembolic etiology. Small subdural hemorrhage without shift. Chronic small vessel disease. Limited TTE b/c of patient cooperation, but no evidence of vegetation, GI DD. ABX changed to ceftriaxone.   5/6 Aspiration of R hip. Ortho aware of findings.    5/8 More  alert, oriented to person. Remains on levophed gtt.   5/9 Cortisol assessed, 13.  Stress dose steroids initiated.   5/10 Remains on 51mcg levo, mental status significantly improved.  SSI added with steroids.   5/11 transfuse 1 unit PRBC, transition off levophed  5/13: TRH resumed care.  5/16: ID suggested PICC line for long term antibiotics.    Assessment & Plan:   Principal Problem:   Septic shock ([place]) Active Problems:   Human immunodeficiency virus (HIV) disease ([place])   AKI (acute kidney injury) ([place])   Lactic acidosis   SDH (subdural hematoma) ([facility])   Hypothermia due to exposure   Cellulitis   Elevated brain natriuretic peptide (BNP) level   Volume overload   Dehydration   Pressure injury of skin   Subdural empyema   Streptococcal bacteremia   Malnutrition of moderate degree   Group G streptococcal bacteremia: Possible sources include Rt hip prosthesis with prior infection, skin/soft tissue.   He has hx. of Streptococcal hip/psoas abscess.  MRI of Rt hip showed large fluid collection.   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   He underwent Rt hip aspiration,  culture negative but had 31K WBC. ID consulted recommended to continue Rocephin until 6/15. Previously seen by Dr. [name] with ortho as outpt.  Ortho is aware. Continue wound care for sacral wound.  Relative adrenal insufficiency. Continued solu cortef 50 mg q6h on 5/12;  if hemodynamics remain stable, then can start to wean off on 5/13. Solu-Cortef 50 mg changed to every 24 hours.  Subdural hematoma secondary to fall with head trauma monitor neuro status. Seems stable.   Anemia of chronic disease. Thrombocytopenia in setting  of sepsis. Monitor CBC Transfuse for Hb < 7, PLT < 10K, or significant bleeding  Thrombocytopenia S/p platelets 5/7 with minimal improvement on 5/8  Monitor CBC monitor for evidence of bleeding   HIV. 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 Lymphopenia noted on CBC on admission with CD4 <35 Continue Prezcobix + Tivicay   Steroid Induced Hyperglycemia Continue SSI  Moderate Malnutrition  Dysphagia. D3 diet, meds crushed in apple sauce Continue speech therapy  History of anal cancer, recent biopsy by Dr. Dema Severin negative for malignancy Continue supportive care   Deconditioning. - PT/OT  Goals of care. - palliative care consulted.   DVT prophylaxis: SCDs Code Status: Full code. Family Communication: No family at bedside. Disposition Plan:  Status is: Inpatient  Remains inpatient appropriate because:Inpatient level of care appropriate due to severity of illness   Dispo: The patient is from: Home              Anticipated d/c is to: Home              Patient currently is not medically stable to d/c.   Difficult to place patient No   Consultants:   PCCM  Procedures:   Antimicrobials:  Anti-infectives (From admission, onward)   Start     Dose/Rate Route Frequency Ordered Stop   05/21/20 1015  dolutegravir (TIVICAY) tablet 50 mg        50 mg Oral Daily 05/21/20 0920     05/21/20 0800  rilpivirine (EDURANT) tablet 25 mg        25 mg Oral Daily with breakfast 05/20/20 1107     05/20/20 1000  sulfamethoxazole-trimethoprim (BACTRIM DS) 800-160 MG per tablet 1 tablet        1 tablet Oral Daily 05/19/20 1223     05/17/20 1015  sulfamethoxazole-trimethoprim (BACTRIM DS) 800-160 MG per tablet 1 tablet  Status:  Discontinued        1 tablet Per Tube Daily 05/17/20 0924 05/19/20 1223   05/14/20 1945  darunavir-cobicistat (PREZCOBIX) 800-150 MG per tablet 1 tablet       Note to Pharmacy: Crush and give per NG tube   1 tablet Oral Daily 05/14/20 1857     05/14/20 1945  dolutegravir (TIVICAY) tablet 50 mg  Status:  Discontinued        50 mg Per Tube Daily 05/14/20 1857 05/21/20 0920   05/14/20 1500  darunavir-cobicistat (PREZCOBIX) 800-150 MG per tablet 1 tablet  Status:  Discontinued         1 tablet Oral Daily with breakfast 05/14/20 1414 05/14/20 1418   05/14/20 1500  dolutegravir (TIVICAY) tablet 50 mg  Status:  Discontinued        50 mg Per Tube Daily 05/14/20 1414 05/14/20 1418   05/13/20 1300  cefTRIAXone (ROCEPHIN) 2 g in sodium chloride 0.9 % 100 mL IVPB        2 g 200 mL/hr over 30 Minutes Intravenous Every 12 hours 05/13/20 1207     05/13/20 0930  cefTRIAXone (ROCEPHIN) 2 g in sodium chloride 0.9 % 100 mL IVPB  Status:  Discontinued        2 g 200 mL/hr over 30 Minutes Intravenous Every 24 hours 05/13/20 0924 05/13/20 1207   05/12/20 1845  ceFEPIme (MAXIPIME) 2 g in sodium chloride 0.9 % 100 mL IVPB        2 g 200 mL/hr over 30 Minutes Intravenous  Once 05/12/20 1832 05/12/20 2015   05/12/20 1845  metroNIDAZOLE (FLAGYL)  IVPB 500 mg        500 mg 100 mL/hr over 60 Minutes Intravenous  Once 05/12/20 1832 05/12/20 2025   05/12/20 1845  vancomycin (VANCOCIN) IVPB 1000 mg/200 mL premix  Status:  Discontinued        1,000 mg 200 mL/hr over 60 Minutes Intravenous  Once 05/12/20 1832 05/12/20 1837   05/12/20 1845  vancomycin (VANCOREADY) IVPB 1500 mg/300 mL        1,500 mg 150 mL/hr over 120 Minutes Intravenous  Once 05/12/20 1838 05/12/20 2225      Subjective: Patient was seen and examined at bedside.  Overnight events noted.   Patient is lying comfortably on the bed,  denies any shortness of breath.   Patient reports pain is better controlled with pain medications.  Objective: Vitals:   05/23/20 0453 05/23/20 1343 05/23/20 2034 05/24/20 0549  BP: (!) 154/89 128/70 (!) 155/85 (!) 154/86  Pulse: 66 63 66 66  Resp: 20 15 18 17   Temp: 97.8 F (36.6 C) 98.1 F (36.7 C) 97.8 F (36.6 C) 98.4 F (36.9 C)  TempSrc:   Oral   SpO2: 95% 99% 100% 99%  Weight:      Height:        Intake/Output Summary (Last 24 hours) at 05/24/2020 1401 Last data filed at 05/24/2020 0700 Gross per 24 hour  Intake 20 ml  Output 901 ml  Net -881 ml   Filed Weights   05/20/20  0500 05/21/20 0500  Weight: 74.6 kg 76.8 kg    Examination:  General exam: Appears calm and comfortable , not in any acute distress. Respiratory system: Clear to auscultation. Respiratory effort normal. Cardiovascular system: S1 & S2 heard, RRR. No JVD, murmurs, rubs, gallops or clicks. No pedal edema. Gastrointestinal system: Abdomen is nondistended, soft and nontender. No organomegaly or masses felt.  Normal bowel sounds heard. Central nervous system: Alert and oriented. No focal neurological deficits. Extremities: 1+ pedal edema, no cyanosis no clubbing. Skin: Sacral wound noted.  Tenderness positive Psychiatry: Judgement and insight appear normal. Mood & affect appropriate.     Data Reviewed: I have personally reviewed following labs and imaging studies  CBC: Recent Labs  Lab 05/19/20 0317 05/19/20 2033 05/20/20 0259 05/21/20 0553 05/22/20 0559 05/23/20 0503 05/24/20 0527  WBC 13.6*  --  7.7 9.4 7.4 9.9  --   HGB 7.2*   < > 7.8* 7.7* 8.1* 8.2* 8.0*  HCT 21.7*   < > 24.4* 24.5* 26.1* 25.8* 26.2*  MCV 109.6*  --  107.0* 110.4* 113.5* 110.3*  --   PLT 95*  --  102* 141* 161 188  --    < > = values in this interval not displayed.   Basic Metabolic Panel: Recent Labs  Lab 05/20/20 0259 05/21/20 0553 05/22/20 0559 05/23/20 0503 05/24/20 0527  NA 142 144 144 144 145  K 4.3 3.9 4.1 4.1 4.0  CL 115* 116* 117* 119* 114*  CO2 24 25 23 22 22   GLUCOSE 94 94 89 100* 102*  BUN 43* 43* 31* 30* 27*  CREATININE 1.27* 1.36* 1.36* 1.16 1.02  CALCIUM 7.9* 7.8* 7.9* 7.7* 7.8*  MG  --   --  2.1 2.0  --   PHOS  --   --  4.1 3.4  --    GFR: Estimated Creatinine Clearance: 85.8 mL/min (by C-G formula based on SCr of 1.02 mg/dL). Liver Function Tests: Recent Labs  Lab 05/20/20 0259  AST 37  ALT 35  ALKPHOS 63  BILITOT 0.5  PROT 4.8*  ALBUMIN 1.7*   No results for input(s): LIPASE, AMYLASE in the last 168 hours. No results for input(s): AMMONIA in the last 168  hours. Coagulation Profile: No results for input(s): INR, PROTIME in the last 168 hours. Cardiac Enzymes: No results for input(s): CKTOTAL, CKMB, CKMBINDEX, TROPONINI in the last 168 hours. BNP (last 3 results) No results for input(s): PROBNP in the last 8760 hours. HbA1C: No results for input(s): HGBA1C in the last 72 hours. CBG: Recent Labs  Lab 05/23/20 1154 05/23/20 1658 05/23/20 2038 05/24/20 0806 05/24/20 1152  GLUCAP 113* 146* 130* 100* 93   Lipid Profile: No results for input(s): CHOL, HDL, LDLCALC, TRIG, CHOLHDL, LDLDIRECT in the last 72 hours. Thyroid Function Tests: No results for input(s): TSH, T4TOTAL, FREET4, T3FREE, THYROIDAB in the last 72 hours. Anemia Panel: No results for input(s): VITAMINB12, FOLATE, FERRITIN, TIBC, IRON, RETICCTPCT in the last 72 hours. Sepsis Labs: No results for input(s): PROCALCITON, LATICACIDVEN in the last 168 hours.  Recent Results (from the past 240 hour(s))  Body fluid culture w Gram Stain     Status: None   Collection Time: 05/14/20  3:29 PM   Specimen: Joint, Hip; Body Fluid  Result Value Ref Range Status   Specimen Description   Final    HIP RIGHT Performed at [facility] [address], [place], [place] [address]    Special Requests   Final    NONE Performed at [facility], [place] [address] [place], [place] [address]    Gram Stain   Final    MODERATE WBC PRESENT,BOTH PMN AND MONONUCLEAR NO ORGANISMS SEEN    Culture   Final    NO GROWTH 3 DAYS Performed at [facility], [place] [address] [place], [place] [address]    Report Status 05/18/2020 FINAL  Final  Culture, blood (Routine X 2) w Reflex to ID Panel     Status: None   Collection Time: 05/18/20 10:36 AM   Specimen: BLOOD LEFT HAND  Result Value Ref Range Status   Specimen Description   Final    BLOOD LEFT HAND Performed at [facility] [address], [place], [place] [address]    Special  Requests   Final    BOTTLES DRAWN AEROBIC ONLY Blood Culture adequate volume Performed at [facility] [address], [place], [place] [address]    Culture   Final    NO GROWTH 5 DAYS Performed at [facility], [place] [address] [place], [place] [address]    Report Status 05/23/2020 FINAL  Final  Culture, blood (Routine X 2) w Reflex to ID Panel     Status: None   Collection Time: 05/18/20 10:36 AM   Specimen: BLOOD LEFT HAND  Result Value Ref Range Status   Specimen Description   Final    BLOOD LEFT HAND Performed at [facility] [address], [place], [place] [address]    Special Requests   Final    BOTTLES DRAWN AEROBIC ONLY Blood Culture results may not be optimal due to an inadequate volume of blood received in culture bottles Performed at [facility] [address] [place], [place] [address]    Culture   Final    NO GROWTH 5 DAYS Performed at [facility], [place] [address], [place],  [address]    Report Status 05/23/2020 FINAL  Final  Radiology Studies: [place] EKG SITE RITE  Result Date: 05/24/2020 If Site Rite image not attached, placement could not be confirmed due to current cardiac rhythm.       Scheduled Meds: . sodium chloride   Intravenous Once  . chlorhexidine  15 mL Mouth Rinse BID  . Chlorhexidine Gluconate Cloth  6 each Topical Daily  . collagenase   Topical Daily  . darunavir-cobicistat  1 tablet Oral Daily  . dolutegravir  50 mg Oral Daily  . feeding supplement  237 mL Oral TID BM  . hydrocortisone sod succinate (SOLU-CORTEF) inj  50 mg Intravenous Daily  . insulin aspart  0-5 Units Subcutaneous QHS  . insulin aspart  0-9 Units Subcutaneous TID WC  . mouth rinse  15 mL Mouth Rinse q12n4p  . rilpivirine  25 mg Oral Q breakfast  . sulfamethoxazole-trimethoprim  1 tablet Oral Daily   Continuous Infusions: . cefTRIAXone (ROCEPHIN)  IV 2 g (05/24/20 1010)  .  lactated ringers 75 mL/hr at 05/21/20 1705     LOS: 11 days    Time spent: 25 mins.    Shawna Clamp, MD [facility]   If 7PM-7AM, please contact night-coverage

## 2020-05-25 ENCOUNTER — Ambulatory Visit: Payer: Medicare Other | Admitting: Infectious Diseases

## 2020-05-25 DIAGNOSIS — R6521 Severe sepsis with septic shock: Secondary | ICD-10-CM | POA: Diagnosis not present

## 2020-05-25 DIAGNOSIS — A419 Sepsis, unspecified organism: Secondary | ICD-10-CM | POA: Diagnosis not present

## 2020-05-25 LAB — BASIC METABOLIC PANEL
Anion gap: 4 — ABNORMAL LOW (ref 5–15)
BUN: 22 mg/dL — ABNORMAL HIGH (ref 6–20)
CO2: 23 mmol/L (ref 22–32)
Calcium: 7.6 mg/dL — ABNORMAL LOW (ref 8.9–10.3)
Chloride: 116 mmol/L — ABNORMAL HIGH (ref 98–111)
Creatinine, Ser: 1.15 mg/dL (ref 0.61–1.24)
GFR, Estimated: >60 mL/min (ref >60)
Glucose, Bld: 129 mg/dL — ABNORMAL HIGH (ref 70–99)
Potassium: 3.8 mmol/L (ref 3.5–5.1)
Sodium: 143 mmol/L (ref 135–145)

## 2020-05-25 LAB — GLUCOSE, CAPILLARY
Glucose-Capillary: 82 mg/dL (ref 70–99)
Glucose-Capillary: 81 mg/dL (ref 70–99)
Glucose-Capillary: 94 mg/dL (ref 70–99)
Glucose-Capillary: 85 mg/dL (ref 70–99)
Glucose-Capillary: 98 mg/dL (ref 70–99)

## 2020-05-25 LAB — MAGNESIUM: Magnesium: 1.9 mg/dL (ref 1.7–2.4)

## 2020-05-25 LAB — HEMOGLOBIN AND HEMATOCRIT, BLOOD
HCT: 28 % — ABNORMAL LOW (ref 39.0–52.0)
Hemoglobin: 8.5 g/dL — ABNORMAL LOW (ref 13.0–17.0)

## 2020-05-25 LAB — PHOSPHORUS: Phosphorus: 2.7 mg/dL (ref 2.5–4.6)

## 2020-05-25 MED ORDER — SODIUM CHLORIDE 0.9% FLUSH: 10.0000 mL | INTRAVENOUS | Status: DC | PRN

## 2020-05-25 MED ORDER — SODIUM CHLORIDE 0.9% FLUSH
10.0000 mL | Freq: Two times a day (BID) | INTRAVENOUS | Status: DC
  Administered 2020-05-26 – 2020-05-28 (×3): 10 mL

## 2020-05-25 NOTE — Progress Notes (Signed)
Peripherally Inserted Central Catheter Placement  The IV Nurse has discussed with the patient and/or persons authorized to consent for the patient, the purpose of this procedure and the potential benefits and risks involved with this procedure.  The benefits include less needle sticks, lab draws from the catheter, and the patient may be discharged home with the catheter. Risks include, but not limited to, infection, bleeding, blood clot (thrombus formation), and puncture of an artery; nerve damage and irregular heartbeat and possibility to perform a PICC exchange if needed/ordered by physician.  Alternatives to this procedure were also discussed.  Bard Power PICC patient education guide, fact sheet on infection prevention and patient information card has been provided to patient /or left at bedside.    PICC Placement Documentation  PICC Single Lumen 05/25/20 Right Brachial 38 cm 0 cm (Active)  Indication for Insertion or Continuance of Line Home intravenous therapies (PICC only) 05/25/20 1500  Exposed Catheter (cm) 0 cm 05/25/20 1500  Site Assessment Clean;Dry;Intact 05/25/20 1500  Line Status Flushed;Saline locked;Blood return noted 05/25/20 1500  Dressing Type Transparent;Securing device 05/25/20 1500  Dressing Status Clean;Dry;Intact 05/25/20 1500  Antimicrobial disc in place? Yes 05/25/20 1500  Safety Lock Not Applicable 92/33/00 7622  Line Care Connections checked and tightened 05/25/20 1500  Dressing Intervention New dressing 05/25/20 1500  Dressing Change Due 06/01/20 05/25/20 1500       [name] [name] [name] 05/25/2020, 3:08 PM

## 2020-05-25 NOTE — Progress Notes (Signed)
[facility] for Infectious Disease   Reason for visit: Follow up on bacteremia  Interval History: no acute events; no complaints.  No family at bedside; afebrile.  Day 14 total antibiotics Day 13 ceftriaxone  Physical Exam: Constitutional:  Vitals:   05/24/20 1400 05/25/20 1351  BP: 130/82 (!) 142/73  Pulse: 70 60  Resp: 18 18  Temp: 98.2 F (36.8 C) 98.1 F (36.7 C)  SpO2: 98% 98%   patient appears in NAD Respiratory: Normal respiratory effort; CTA B Cardiovascular: RRR GI: soft, nt, nd Skin: bilateral feet with pressure wounds, tenderness  Review of Systems: Constitutional: negative for fevers and chills Gastrointestinal: negative for diarrhea  Lab Results  Component Value Date   WBC 9.9 05/23/2020   HGB 8.5 (L) 05/25/2020   HCT 28.0 (L) 05/25/2020   MCV 110.3 (H) 05/23/2020   PLT 188 05/23/2020    Lab Results  Component Value Date   CREATININE 1.15 05/25/2020   BUN 22 (H) 05/25/2020   NA 143 05/25/2020   K 3.8 05/25/2020   CL 116 (H) 05/25/2020   CO2 23 05/25/2020    Lab Results  Component Value Date   ALT 35 05/20/2020   AST 37 05/20/2020   ALKPHOS 63 05/20/2020     Microbiology: Recent Results (from the past 240 hour(s))  Culture, blood (Routine X 2) w Reflex to ID Panel     Status: None   Collection Time: 05/18/20 10:36 AM   Specimen: BLOOD LEFT HAND  Result Value Ref Range Status   Specimen Description   Final    BLOOD LEFT HAND Performed at [facility], [place] [address], [place], [place] [address]    Special Requests   Final    BOTTLES DRAWN AEROBIC ONLY Blood Culture adequate volume Performed at [facility] [address] [place], [place] [address]    Culture   Final    NO GROWTH 5 DAYS Performed at [facility], [place] [address] [place], [place] [address]    Report Status 05/23/2020 FINAL  Final  Culture, blood (Routine X 2) w Reflex to ID Panel     Status: None   Collection Time:  05/18/20 10:36 AM   Specimen: BLOOD LEFT HAND  Result Value Ref Range Status   Specimen Description   Final    BLOOD LEFT HAND Performed at [facility] [address], [place], [place] [address]    Special Requests   Final    BOTTLES DRAWN AEROBIC ONLY Blood Culture results may not be optimal due to an inadequate volume of blood received in culture bottles Performed at [facility] [address] [place], [place] [address]    Culture   Final    NO GROWTH 5 DAYS Performed at [facility], [place] [address], [place], [place] [address]    Report Status 05/23/2020 FINAL  Final    Impression/Plan:  1. Right hip effusion - some WBCs noted on gram stain but culture remained negative (on antibiotics).  No surgery planned by orthopedics at the time but will need follow up with Dr. [name] for reevaluation.    2.  Septic emboli to brain - blood cultures with group G Strep and on high dose ceftriaxone.  Will plan for a prolonged treatment course for 6 weeks through June 28, 2020.    3.  HIV - on his home regimen with Prezcobix, Tivicay and rilpivirine and will continue this.  He will have follow up care and can repeat his labs at that time.    4.  At risk for opportunistic infection - on Bactrim for prophylaxis and will continue.    5.  Bilateral feet lesions - c/w pressure injuries on exam rather than Kaposi's as previous suggested.  Will continue to monitor and reassess as he heals.    6. Weakness - will be going to rehab for continued care.

## 2020-05-25 NOTE — Progress Notes (Signed)
PROGRESS NOTE    [name] [name]  [id number] DOB: Apr 18, 1960 DOA: 05/12/2020 PCP: [name], MD   Brief Narrative: This 60 years old male with past medical history significant for HIV, noncompliant with his medications, gout, DVT, rectal cancer, tobacco abuse presented in the ED after being found down.  Patient was admitted for sepsis and subdural hemorrhage.  Neurosurgery was consulted, states no intervention needed.  Findings were concerning for possible embolic ischemic infarcts.  TTE is negative.  Right hip MRI shows fluid collection s/p aspiration by IR,  blood cultures positive for group B streptococci.  ID recommended long-term IV antibiotics.   Significant hospital events:  5/4 Admit after being found down, sepis and SDH. CT ABD/Pelvis > no acute intra-abdominal process,  evidence of sacral decubitus, slight increase compared to prior but no underlying fluid collection, left hip prosthesis   5/5 CT Head > posterior parafalcine SDH.  Cefepime, vanco, flagyl. Remains confused, GP bacteremia on BCID. Family reported 1 wk hx of GI c/o's, fell landing on right hip, concerned he has been drinking more & difficulties with medication identification.  MRI R Hip > large fluid collection surrounding right hip arthroplasty with minimal peripheral enhancement, fluid extends along the femoral component within the medullary cavity of the proximal femur. Small soft tissue defect along the right aspect of the gluteal cleft, bilateral hydroceles. MRI Brain with multifocal acute to subacute ischemic infarcts involving the bilateral cerebral and cerebellar hemispheres, associated mild scattered petechial hemorrhage - possible central thromboembolic etiology. Small subdural hemorrhage without shift. Chronic small vessel disease. Limited TTE b/c of patient cooperation, but no evidence of vegetation, GI DD. ABX changed to ceftriaxone.   5/6 Aspiration of R hip. Ortho aware of findings.    5/8 More  alert, oriented to person. Remains on levophed gtt.   5/9 Cortisol assessed, 13.  Stress dose steroids initiated.   5/10 Remains on 39mcg levo, mental status significantly improved.  SSI added with steroids.   5/11 transfuse 1 unit PRBC, transition off levophed  5/13: TRH resumed care.  5/16: ID suggested PICC line for long term antibiotics.    Assessment & Plan:   Principal Problem:   Septic shock ([place]) Active Problems:   Human immunodeficiency virus (HIV) disease ([place])   AKI (acute kidney injury) ([place])   Lactic acidosis   SDH (subdural hematoma) ([facility])   Hypothermia due to exposure   Cellulitis   Elevated brain natriuretic peptide (BNP) level   Volume overload   Dehydration   Pressure injury of skin   Subdural empyema   Streptococcal bacteremia   Malnutrition of moderate degree   Group G streptococcal bacteremia: Possible sources include Rt hip prosthesis with prior infection, skin/soft tissue.   He has hx. of Streptococcal hip/psoas abscess.  MRI of Rt hip showed large fluid collection.   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   He underwent Rt hip aspiration,  culture negative but had 31K WBC. ID consulted recommended to continue Rocephin until 6/15. Previously seen by Dr. [name] with ortho as outpt.  Ortho is aware. Continue wound care for sacral wound. ID recommended PICC line.  Relative adrenal insufficiency. Continued solu cortef 50 mg q6h on 5/12;  if hemodynamics remain stable, then can start to wean off on 5/13. Solu-Cortef 50 mg changed to every 24 hours.  Subdural hematoma secondary to fall with head trauma monitor neuro status. Seems stable.   Anemia of chronic  disease. Thrombocytopenia in setting of sepsis. Monitor CBC Transfuse for Hb < 7, PLT < 10K, or significant bleeding  Thrombocytopenia S/p platelets 5/7 with minimal improvement on 5/8  Monitor CBC monitor for evidence of bleeding    HIV. 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 Lymphopenia noted on CBC on admission with CD4 <35 Continue Prezcobix + Tivicay   Steroid Induced Hyperglycemia Continue SSI  Moderate Malnutrition  Dysphagia. D3 diet, meds crushed in apple sauce Continue speech therapy  History of anal cancer, recent biopsy by Dr. Dema Severin negative for malignancy Continue supportive care   Deconditioning. - PT/OT  Goals of care. - palliative care consulted.   DVT prophylaxis: SCDs Code Status: Full code. Family Communication: No family at bedside. Disposition Plan:  Status is: Inpatient  Remains inpatient appropriate because:Inpatient level of care appropriate due to severity of illness   Dispo: The patient is from: Home              Anticipated d/c is to: SNF in 1-2 days.              Patient currently is not medically stable to d/c.   Difficult to place patient No   Consultants:   PCCM  Procedures:   Antimicrobials:  Anti-infectives (From admission, onward)   Start     Dose/Rate Route Frequency Ordered Stop   05/21/20 1015  dolutegravir (TIVICAY) tablet 50 mg        50 mg Oral Daily 05/21/20 0920     05/21/20 0800  rilpivirine (EDURANT) tablet 25 mg        25 mg Oral Daily with breakfast 05/20/20 1107     05/20/20 1000  sulfamethoxazole-trimethoprim (BACTRIM DS) 800-160 MG per tablet 1 tablet        1 tablet Oral Daily 05/19/20 1223     05/17/20 1015  sulfamethoxazole-trimethoprim (BACTRIM DS) 800-160 MG per tablet 1 tablet  Status:  Discontinued        1 tablet Per Tube Daily 05/17/20 0924 05/19/20 1223   05/14/20 1945  darunavir-cobicistat (PREZCOBIX) 800-150 MG per tablet 1 tablet       Note to Pharmacy: Crush and give per NG tube   1 tablet Oral Daily 05/14/20 1857     05/14/20 1945  dolutegravir (TIVICAY) tablet 50 mg  Status:  Discontinued        50 mg Per Tube Daily 05/14/20 1857 05/21/20 0920   05/14/20 1500  darunavir-cobicistat (PREZCOBIX) 800-150 MG  per tablet 1 tablet  Status:  Discontinued        1 tablet Oral Daily with breakfast 05/14/20 1414 05/14/20 1418   05/14/20 1500  dolutegravir (TIVICAY) tablet 50 mg  Status:  Discontinued        50 mg Per Tube Daily 05/14/20 1414 05/14/20 1418   05/13/20 1300  cefTRIAXone (ROCEPHIN) 2 g in sodium chloride 0.9 % 100 mL IVPB        2 g 200 mL/hr over 30 Minutes Intravenous Every 12 hours 05/13/20 1207     05/13/20 0930  cefTRIAXone (ROCEPHIN) 2 g in sodium chloride 0.9 % 100 mL IVPB  Status:  Discontinued        2 g 200 mL/hr over 30 Minutes Intravenous Every 24 hours 05/13/20 0924 05/13/20 1207   05/12/20 1845  ceFEPIme (MAXIPIME) 2 g in sodium chloride 0.9 % 100 mL IVPB        2 g 200 mL/hr over 30 Minutes Intravenous  Once 05/12/20 1832 05/12/20 2015  05/12/20 1845  metroNIDAZOLE (FLAGYL) IVPB 500 mg        500 mg 100 mL/hr over 60 Minutes Intravenous  Once 05/12/20 1832 05/12/20 2025   05/12/20 1845  vancomycin (VANCOCIN) IVPB 1000 mg/200 mL premix  Status:  Discontinued        1,000 mg 200 mL/hr over 60 Minutes Intravenous  Once 05/12/20 1832 05/12/20 1837   05/12/20 1845  vancomycin (VANCOREADY) IVPB 1500 mg/300 mL        1,500 mg 150 mL/hr over 120 Minutes Intravenous  Once 05/12/20 1838 05/12/20 2225      Subjective: Patient was seen and examined at bedside.  Overnight events noted.   Patient is lying comfortably on the bed,  denies any shortness of breath.   Patient reports pain is better controlled with pain medications. Patient is going to get the PICC line today.  Objective: Vitals:   05/23/20 2034 05/24/20 0549 05/24/20 1400 05/25/20 1351  BP: (!) 155/85 (!) 154/86 130/82 (!) 142/73  Pulse: 66 66 70 60  Resp: 18 17 18 18   Temp: 97.8 F (36.6 C) 98.4 F (36.9 C) 98.2 F (36.8 C) 98.1 F (36.7 C)  TempSrc: Oral  Oral Oral  SpO2: 100% 99% 98% 98%  Weight:      Height:        Intake/Output Summary (Last 24 hours) at 05/25/2020 1422 Last data filed at 05/25/2020  1016 Gross per 24 hour  Intake 240 ml  Output 2100 ml  Net -1860 ml   Filed Weights   05/20/20 0500 05/21/20 0500  Weight: 74.6 kg 76.8 kg    Examination:  General exam: Appears calm and comfortable , not in any acute distress. Respiratory system: Clear to auscultation. Respiratory effort normal. Cardiovascular system: S1 & S2 heard, RRR. No JVD, murmurs, rubs, gallops or clicks. No pedal edema. Gastrointestinal system: Abdomen is nondistended, soft and nontender. No organomegaly or masses felt.  Normal bowel sounds heard. Central nervous system: Alert and oriented. No focal neurological deficits. Extremities: 1+ pedal edema, no cyanosis no clubbing. Skin: Sacral wound noted.  Tenderness positive Psychiatry: Judgement and insight appear normal. Mood & affect appropriate.     Data Reviewed: I have personally reviewed following labs and imaging studies  CBC: Recent Labs  Lab 05/19/20 0317 05/19/20 2033 05/20/20 0259 05/21/20 0553 05/22/20 0559 05/23/20 0503 05/24/20 0527 05/25/20 0500  WBC 13.6*  --  7.7 9.4 7.4 9.9  --   --   HGB 7.2*   < > 7.8* 7.7* 8.1* 8.2* 8.0* 8.5*  HCT 21.7*   < > 24.4* 24.5* 26.1* 25.8* 26.2* 28.0*  MCV 109.6*  --  107.0* 110.4* 113.5* 110.3*  --   --   PLT 95*  --  102* 141* 161 188  --   --    < > = values in this interval not displayed.   Basic Metabolic Panel: Recent Labs  Lab 05/21/20 0553 05/22/20 0559 05/23/20 0503 05/24/20 0527 05/25/20 0500  NA 144 144 144 145 143  K 3.9 4.1 4.1 4.0 3.8  CL 116* 117* 119* 114* 116*  CO2 25 23 22 22 23   GLUCOSE 94 89 100* 102* 129*  BUN 43* 31* 30* 27* 22*  CREATININE 1.36* 1.36* 1.16 1.02 1.15  CALCIUM 7.8* 7.9* 7.7* 7.8* 7.6*  MG  --  2.1 2.0  --  1.9  PHOS  --  4.1 3.4  --  2.7   GFR: Estimated Creatinine Clearance: 76.1 mL/min (by C-G  formula based on SCr of 1.15 mg/dL). Liver Function Tests: Recent Labs  Lab 05/20/20 0259  AST 37  ALT 35  ALKPHOS 63  BILITOT 0.5  PROT 4.8*   ALBUMIN 1.7*   No results for input(s): LIPASE, AMYLASE in the last 168 hours. No results for input(s): AMMONIA in the last 168 hours. Coagulation Profile: No results for input(s): INR, PROTIME in the last 168 hours. Cardiac Enzymes: No results for input(s): CKTOTAL, CKMB, CKMBINDEX, TROPONINI in the last 168 hours. BNP (last 3 results) No results for input(s): PROBNP in the last 8760 hours. HbA1C: No results for input(s): HGBA1C in the last 72 hours. CBG: Recent Labs  Lab 05/24/20 1646 05/24/20 1949 05/24/20 2334 05/25/20 0758 05/25/20 1155  GLUCAP 82 133* 126* 81 94   Lipid Profile: No results for input(s): CHOL, HDL, LDLCALC, TRIG, CHOLHDL, LDLDIRECT in the last 72 hours. Thyroid Function Tests: No results for input(s): TSH, T4TOTAL, FREET4, T3FREE, THYROIDAB in the last 72 hours. Anemia Panel: No results for input(s): VITAMINB12, FOLATE, FERRITIN, TIBC, IRON, RETICCTPCT in the last 72 hours. Sepsis Labs: No results for input(s): PROCALCITON, LATICACIDVEN in the last 168 hours.  Recent Results (from the past 240 hour(s))  Culture, blood (Routine X 2) w Reflex to ID Panel     Status: None   Collection Time: 05/18/20 10:36 AM   Specimen: BLOOD LEFT HAND  Result Value Ref Range Status   Specimen Description   Final    BLOOD LEFT HAND Performed at [facility] [address] [place], [place] [address]    Special Requests   Final    BOTTLES DRAWN AEROBIC ONLY Blood Culture adequate volume Performed at [facility] [address] [place], [place] [address]    Culture   Final    NO GROWTH 5 DAYS Performed at [facility], [place] [address] [place], [place] [address]    Report Status 05/23/2020 FINAL  Final  Culture, blood (Routine X 2) w Reflex to ID Panel     Status: None   Collection Time: 05/18/20 10:36 AM   Specimen: BLOOD LEFT HAND  Result Value Ref Range Status   Specimen Description   Final    BLOOD LEFT  HAND Performed at [facility] [address], [place], [place] [address]    Special Requests   Final    BOTTLES DRAWN AEROBIC ONLY Blood Culture results may not be optimal due to an inadequate volume of blood received in culture bottles Performed at [facility] [address], [place], [place] [address]    Culture   Final    NO GROWTH 5 DAYS Performed at [facility], [place] [address], [place], [place] [address]    Report Status 05/23/2020 FINAL  Final         Radiology Studies: [place] EKG SITE RITE  Result Date: 05/24/2020 If Site Rite image not attached, placement could not be confirmed due to current cardiac rhythm.       Scheduled Meds: . sodium chloride   Intravenous Once  . chlorhexidine  15 mL Mouth Rinse BID  . Chlorhexidine Gluconate Cloth  6 each Topical Daily  . collagenase   Topical Daily  . darunavir-cobicistat  1 tablet Oral Daily  . dolutegravir  50 mg Oral Daily  . feeding supplement  237 mL Oral TID BM  . hydrocortisone sod succinate (SOLU-CORTEF) inj  50 mg Intravenous Daily  . insulin aspart  0-5 Units Subcutaneous QHS  . insulin aspart  0-9 Units Subcutaneous TID WC  . mouth rinse  15 mL Mouth Rinse q12n4p  . rilpivirine  25 mg Oral Q breakfast  . sulfamethoxazole-trimethoprim  1 tablet Oral Daily   Continuous Infusions: . cefTRIAXone (ROCEPHIN)  IV 2 g (05/25/20 1217)  . lactated ringers 75 mL/hr at 05/24/20 2238     LOS: 12 days    Time spent: 25 mins.    Shawna Clamp, MD [facility]   If 7PM-7AM, please contact night-coverage

## 2020-05-25 NOTE — TOC Initial Note (Signed)
Transition of Care [facility]) - Initial/Assessment Note    Patient Details  Name: [name] [name] MRN: [id number] Date of Birth: Oct 03, 1960  [facility]) CM/SW Contact:    Trish Mage, LCSW Phone Number: 05/25/2020, 2:52 PM  Clinical Narrative:   Patient seen in follow up to PT recommendation of SNF.  Both Mr Haydu and his sister [name] who was in the room visiting agree that SNF is essential at this point for his well being.  They would prefer [facility] or [facility] as both are more convenient for family to visit.  Sister states they have full supply of HIV meds for him for placement.  He will also be on 6 weeks of IV Rocephin.   Bed search initiated.             Expected Discharge Plan: Skilled Nursing Facility Barriers to Discharge: SNF Pending bed offer   Patient Goals and CMS Choice Patient states their goals for this hospitalization and ongoing recovery are:: "I need help" CMS Medicare.gov Compare Post Acute Care list provided to:: Patient Choice offered to / list presented to : [place]  Expected Discharge Plan and Services Expected Discharge Plan: [place]   Discharge Planning Services: CM Consult Post Acute Care Choice: [place] Living arrangements for the past 2 months: Apartment                                      Prior Living Arrangements/Services Living arrangements for the past 2 months: Apartment Lives with:: Self Patient language and need for interpreter reviewed:: Yes Do you feel safe going back to the place where you live?: Yes      Need for Family Participation in Patient Care: Yes (Comment) Care giver support system in place?: Yes (comment) Current home services: DME Criminal Activity/Legal Involvement Pertinent to Current Situation/Hospitalization: No - Comment as needed  Activities of Daily Living Home Assistive Devices/Equipment: Cane (specify quad or straight),Walker (specify  type),Wheelchair,Other (Comment) (raised toilet seat, single point cane, front wheeled walker) ADL Screening (condition at time of admission) Patient's cognitive ability adequate to safely complete daily activities?: No Is the patient deaf or have difficulty hearing?: No Does the patient have difficulty seeing, even when wearing glasses/contacts?: No Does the patient have difficulty concentrating, remembering, or making decisions?: Yes Patient able to express need for assistance with ADLs?: Yes Does the patient have difficulty dressing or bathing?: Yes Independently performs ADLs?: No Communication: Independent Dressing (OT): Needs assistance Is this a change from baseline?: Change from baseline, expected to last >3 days Grooming: Independent Feeding: Needs assistance Is this a change from baseline?: Change from baseline, expected to last >3 days Bathing: Needs assistance Is this a change from baseline?: Change from baseline, expected to last >3 days Toileting: Needs assistance Is this a change from baseline?: Change from baseline, expected to last >3days In/Out Bed: Needs assistance Is this a change from baseline?: Change from baseline, expected to last >3 days Walks in Home: Needs assistance Is this a change from baseline?: Change from baseline, expected to last >3 days Does the patient have difficulty walking or climbing stairs?: Yes (secondary to weakness) Weakness of Legs: Both Weakness of Arms/Hands: Both  Permission Sought/Granted Permission sought to share information with : Family Supports Permission granted to share information with : Yes, Verbal Permission Granted  Share Information with NAME: [name] Sister [phone]  Emotional Assessment Appearance:: Appears stated age Attitude/Demeanor/Rapport: Engaged Affect (typically observed): Calm Orientation: : Oriented to Self,Oriented to Situation Alcohol / Substance Use: Not Applicable Psych  Involvement: No (comment)  Admission diagnosis:  Sepsis ([place]) [A41.9] Septic shock ([place]) [A41.9, R65.21] Patient Active Problem List   Diagnosis Date Noted  . Malnutrition of moderate degree 05/14/2020  . AKI (acute kidney injury) ([place]) 05/13/2020  . Lactic acidosis 05/13/2020  . SDH (subdural hematoma) ([place]) 05/13/2020  . Hypothermia due to exposure 05/13/2020  . Septic shock ([place]) 05/13/2020  . Cellulitis 05/13/2020  . Elevated brain natriuretic peptide (BNP) level 05/13/2020  . Volume overload 05/13/2020  . Dehydration 05/13/2020  . Pressure injury of skin 05/13/2020  . Subdural empyema   . Streptococcal bacteremia   . Rectal bleeding 02/20/2020  . Abnormal digital rectal exam 02/20/2020  . History of anal cancer 02/20/2020  . Poor dentition 01/16/2017  . Renal failure, chronic 06/10/2014  . Septic arthritis of hip ([place]) 07/31/2013  . Decubitus ulcer of sacral region, stage 2 ([place]) 07/31/2013  . Protein-calorie malnutrition, severe ([place]) 07/31/2013  . Pain in limb 07/23/2013  . Inguinal adenopathy 02/18/2013  . Enlargement of lymph nodes 01/13/2013  . 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 ANEMIA, CHRONIC 03/20/2007  . Dental caries 03/20/2007  . Unspecified severe protein-calorie malnutrition ([place]) 02/14/2007  . DVT 08/22/2006  . 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[place]) 05/14/2006  . ABSCESS, RECTUM 04/11/2006  . CANDIDIASIS, MOUTH ([place]) 02/16/2006  . DIARRHEA 02/16/2006  . Human immunodeficiency virus (HIV) disease ([place]) 10/19/2005  . MOLLUSCUM CONTAGIOSUM 10/19/2005  . GOUT 10/19/2005  . HEPATITIS B, HX OF 10/19/2005  . HERPES ZOSTER, HX OF 10/19/2005   PCP:  [name], MD Pharmacy:   [facility] [place], [place] AT [address] [place] [address] Phone: [phone] Fax: [phone]     Social Determinants of Health (SDOH) Interventions    Readmission Risk Interventions No  flowsheet data found.

## 2020-05-25 NOTE — Progress Notes (Signed)
Santyl applied to sacrum wound as ordered. Foam dressing changed due to soilage. Minimal drainage noted from wound. Patient continues to have pain related to multiple wounds. Will continue to monitor.

## 2020-05-25 NOTE — NC FL2 (Signed)
[place] LEVEL OF CARE SCREENING TOOL     IDENTIFICATION  Patient Name: [name] [name] Birthdate: May 25, 1960 Sex: male Admission Date (Current Location): 05/12/2020  County and [place] Number:  Herbalist and Address:  [facility],  [place], [place]      Provider Number: [id number]  Attending Physician Name and Address:  Shawna Clamp, MD  Relative Name and Phone Number:  [name] (Sister)   [phone]    Current Level of Care: Hospital Recommended Level of Care: [place] Prior Approval Number:    Date Approved/Denied:   PASRR Number: [id number]  Discharge Plan: SNF    Current Diagnoses: Patient Active Problem List   Diagnosis Date Noted  . Malnutrition of moderate degree 05/14/2020  . AKI (acute kidney injury) ([place]) 05/13/2020  . Lactic acidosis 05/13/2020  . SDH (subdural hematoma) ([place]) 05/13/2020  . Hypothermia due to exposure 05/13/2020  . Septic shock ([place]) 05/13/2020  . Cellulitis 05/13/2020  . Elevated brain natriuretic peptide (BNP) level 05/13/2020  . Volume overload 05/13/2020  . Dehydration 05/13/2020  . Pressure injury of skin 05/13/2020  . Subdural empyema   . Streptococcal bacteremia   . Rectal bleeding 02/20/2020  . Abnormal digital rectal exam 02/20/2020  . History of anal cancer 02/20/2020  . Poor dentition 01/16/2017  . Renal failure, chronic 06/10/2014  . Septic arthritis of hip ([place]) 07/31/2013  . Decubitus ulcer of sacral region, stage 2 ([place]) 07/31/2013  . Protein-calorie malnutrition, severe ([place]) 07/31/2013  . Pain in limb 07/23/2013  . Inguinal adenopathy 02/18/2013  . Enlargement of lymph nodes 01/13/2013  . 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 ANEMIA, CHRONIC 03/20/2007  . Dental caries 03/20/2007  . Unspecified severe protein-calorie malnutrition ([place]) 02/14/2007  . DVT 08/22/2006  . 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[place]) 05/14/2006  . ABSCESS, RECTUM  04/11/2006  . CANDIDIASIS, MOUTH ([place]) 02/16/2006  . DIARRHEA 02/16/2006  . Human immunodeficiency virus (HIV) disease ([place]) 10/19/2005  . MOLLUSCUM CONTAGIOSUM 10/19/2005  . GOUT 10/19/2005  . HEPATITIS B, HX OF 10/19/2005  . HERPES ZOSTER, HX OF 10/19/2005    Orientation RESPIRATION BLADDER Height & Weight     Self,Situation  Normal Indwelling catheter Weight:  (unable to get WT due to bed) Height:  6\' 2"  (188 cm)  BEHAVIORAL SYMPTOMS/MOOD NEUROLOGICAL BOWEL NUTRITION STATUS   (none)  (none) Incontinent Diet (see d/c summary)  AMBULATORY STATUS COMMUNICATION OF NEEDS Skin   Extensive Assist Verbally  (Pressure injury: knee, bilateral; upper back; heel, bilateral; elbow, posterior, bilateral; sacrum, mid; r shoulder; L knee, anterior; Wound: area surrounding rectum)                       Personal Care Assistance Level of Assistance  Bathing,Feeding,Dressing Bathing Assistance: Maximum assistance Feeding assistance: Limited assistance Dressing Assistance: Maximum assistance     Functional Limitations Info  Sight,Hearing,Speech Sight Info: Adequate Hearing Info: Adequate Speech Info: Adequate    SPECIAL CARE FACTORS FREQUENCY                       Contractures Contractures Info: Not present    Additional Factors Info  Code Status,Allergies Code Status Info: full Allergies Info: collagen           Current Medications (05/25/2020):  This is the current hospital active medication list Current Facility-Administered Medications  Medication Dose Route Frequency Provider Last Rate Last Admin  . 0.9 %  sodium  chloride infusion (Manually program via Guardrails IV Fluids)   Intravenous Once Donita Brooks, NP   Held at 05/19/20 1453  . cefTRIAXone (ROCEPHIN) 2 g in sodium chloride 0.9 % 100 mL IVPB  2 g Intravenous Q12H Susa Raring, RPH 200 mL/hr at 05/25/20 1217 2 g at 05/25/20 1217  . chlorhexidine (PERIDEX) 0.12 % solution 15 mL  15 mL Mouth Rinse  BID [name] Pain, MD   15 mL at 05/25/20 1210  . Chlorhexidine Gluconate Cloth 2 % PADS 6 each  6 each Topical Daily [name] Pain, MD   6 each at 05/24/20 1300  . collagenase (SANTYL) ointment   Topical Daily Juanito Doom, MD   Given at 05/25/20 1212  . darunavir-cobicistat (PREZCOBIX) 800-150 MG per tablet 1 tablet  1 tablet Oral Daily Tommy Medal, Lavell Islam, MD   1 tablet at 05/25/20 1100  . docusate sodium (COLACE) capsule 100 mg  100 mg Oral BID PRN [name] Pain, MD      . dolutegravir (TIVICAY) tablet 50 mg  50 mg Oral Daily Shawna Clamp, MD   50 mg at 05/25/20 1100  . feeding supplement (ENSURE ENLIVE / ENSURE PLUS) liquid 237 mL  237 mL Oral TID BM Shawna Clamp, MD   237 mL at 05/25/20 1217  . hydrocortisone sodium succinate (SOLU-CORTEF) 100 MG injection 50 mg  50 mg Intravenous Daily Shawna Clamp, MD   50 mg at 05/25/20 1100  . insulin aspart (novoLOG) injection 0-5 Units  0-5 Units Subcutaneous QHS Sood, Vineet, MD      . insulin aspart (novoLOG) injection 0-9 Units  0-9 Units Subcutaneous TID WC Chesley Mires, MD   1 Units at 05/23/20 1742  . lactated ringers infusion   Intravenous Continuous Chesley Mires, MD 75 mL/hr at 05/24/20 2238 New Bag at 05/24/20 2238  . MEDLINE mouth rinse  15 mL Mouth Rinse q12n4p [name] Pain, MD   15 mL at 05/23/20 1637  . ondansetron (ZOFRAN) injection 4 mg  4 mg Intravenous Q6H PRN [name] Pain, MD      . oxyCODONE (ROXICODONE) 5 MG/5ML solution 5 mg  5 mg Oral Q4H PRN Chesley Mires, MD   5 mg at 05/25/20 0910  . polyethylene glycol (MIRALAX / GLYCOLAX) packet 17 g  17 g Oral Daily PRN [name] Pain, MD      . rilpivirine [facility]) tablet 25 mg  25 mg Oral Q breakfast Thayer Headings, MD   25 mg at 05/25/20 0930  . sulfamethoxazole-trimethoprim (BACTRIM DS) 800-160 MG per tablet 1 tablet  1 tablet Oral Daily Chesley Mires, MD   1 tablet at 05/25/20 1100     Discharge Medications: Please see discharge summary for a list of  discharge medications.  Relevant Imaging Results:  Relevant Lab Results:   Additional Information [place], [place]

## 2020-05-26 LAB — GLUCOSE, CAPILLARY
Glucose-Capillary: 68 mg/dL — ABNORMAL LOW (ref 70–99)
Glucose-Capillary: 134 mg/dL — ABNORMAL HIGH (ref 70–99)
Glucose-Capillary: 108 mg/dL — ABNORMAL HIGH (ref 70–99)
Glucose-Capillary: 106 mg/dL — ABNORMAL HIGH (ref 70–99)

## 2020-05-26 NOTE — TOC Progression Note (Signed)
Transition of Care [facility]) - Progression Note    Patient Details  Name: [name] [name] MRN: [id number] Date of Birth: [birth year]  Transition of Care [facility]) CM/SW [place], [place] Phone Number: 05/26/2020, 4:00 PM  Clinical Narrative:   Spoke with sisters, patient about bed offers.  [name] is going to call [facility] to see if they are willing to consider taking patient. They will go with [facility] if the answer is no, and we will start insurance auth process. TOC will continue to follow during the course of hospitalization.     Expected Discharge Plan: Skilled Nursing Facility Barriers to Discharge: SNF Pending bed offer  Expected Discharge Plan and Services Expected Discharge Plan: [place]   Discharge Planning Services: CM Consult Post Acute Care Choice: [place] Living arrangements for the past 2 months: Apartment                                       Social Determinants of Health (SDOH) Interventions    Readmission Risk Interventions No flowsheet data found.

## 2020-05-26 NOTE — Progress Notes (Signed)
Physical Therapy Treatment Patient Details Name: [name] [name] MRN: [id number] DOB: 1960/01/25 Today's Date: 05/26/2020    History of Present Illness Patient is a 60 year old male admitted 5/4 after being found down by family. Admitted with sepsis, SDH from fall,H/O R hip replacement and multiple I and Ds, multiple wound sites. 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  Pt rigid throughout and present with joint pain with mobility.  B LE AAROM included HS, ABD, AP and SAQ's.  B UE AAROM TE's included wrist flex/ext, elbow flex/ext and shoulder flex/ext along with internal rotation.  Bed tilt was performed but only at 15% due to increased B foot pain with just the slightest pressure at base of feet.  Total limited tilt time was 12 min.   Pt will need ST Rehab at SNF.    Follow Up Recommendations  SNF     Equipment Recommendations  None recommended by PT    Recommendations for Other Services       Precautions / Restrictions Precautions Precautions: Fall Precaution Comments: multiple wound sites, very painful with minimal movement of right  UE, R ankle, R hip    Mobility  Bed Mobility Overal bed mobility: Needs Assistance Bed Mobility: Rolling Rolling: Max assist;Total assist;+2 for physical assistance         General bed mobility comments: Max/Total Assist to reposition and scoot to HOB pt < 5% Start Time: 1445 Angle: 15 degrees Total Minutes in Angle: 12 minutes  Transfers                 General transfer comment: Rec Maxi Move for any OOB activity  Ambulation/Gait                 Stairs             Wheelchair Mobility    Modified Rankin (Stroke Patients Only)       Balance                                             Cognition Arousal/Alertness: Awake/alert Behavior During Therapy: WFL for tasks assessed/performed;Flat affect Overall Cognitive Status: No family/caregiver present to determine baseline cognitive functioning                                 General Comments: AxO x 2 flat affect with slow/delayed process time      Exercises      General Comments        Pertinent Vitals/Pain Pain Assessment: Faces Faces Pain Scale: Hurts little more Pain Location: all joints with AAROM TE's Pain Descriptors / Indicators: Discomfort;Grimacing Pain Intervention(s): Monitored during session;Repositioned    Home Living                      Prior Function            PT Goals (current goals can now be found in the care plan section) Progress towards PT goals: Progressing toward goals    Frequency    Min 2X/week      PT Plan Current plan  remains appropriate    Co-evaluation              AM-PAC PT "6 Clicks" Mobility   Outcome Measure  Help needed turning from your back to your side while in a flat bed without using bedrails?: Total Help needed moving from lying on your back to sitting on the side of a flat bed without using bedrails?: Total Help needed moving to and from a bed to a chair (including a wheelchair)?: Total Help needed standing up from a chair using your arms (e.g., wheelchair or bedside chair)?: Total Help needed to walk in hospital room?: Total Help needed climbing 3-5 steps with a railing? : Total 6 Click Score: 6    End of Session Equipment Utilized During Treatment: Gait belt Activity Tolerance: Patient limited by pain Patient left: in bed;with call bell/phone within reach;with family/visitor present Nurse Communication: Mobility status;Need for lift equipment PT Visit Diagnosis: Difficulty in walking, not elsewhere classified (R26.2);Pain     Time: 1442-1500 PT Time Calculation (min) (ACUTE ONLY): 18 min  Charges:  $Therapeutic  Exercise: 8-22 mins                     Rica Koyanagi  PTA Acute  Rehabilitation Services Pager      [phone] Office      [phone]

## 2020-05-26 NOTE — TOC Progression Note (Signed)
Transition of Care [facility]) - Progression Note    Patient Details  Name: [name] [name] MRN: [id number] Date of Birth: 1960/06/21  Transition of Care [facility]) CM/SW [place], [place] Phone Number: 05/26/2020, 3:09 PM  Clinical Narrative:   Bed offers received on patient.  Left a message for both sisters for help in nailing down preference. TOC will continue to follow during the course of hospitalization.     Expected Discharge Plan: Skilled Nursing Facility Barriers to Discharge: SNF Pending bed offer  Expected Discharge Plan and Services Expected Discharge Plan: [name]   Discharge Planning Services: CM Consult Post Acute Care Choice: [place] Living arrangements for the past 2 months: Apartment                                       Social Determinants of Health (SDOH) Interventions    Readmission Risk Interventions No flowsheet data found.

## 2020-05-26 NOTE — Progress Notes (Signed)
PROGRESS NOTE    [name] [name]  VQQ:[id number] DOB: [birth year] DOA: 05/12/2020 PCP: [name], MD   Brief Narrative: This 60 years old male with past medical history significant for HIV, noncompliant with his medications, gout, DVT, rectal cancer, tobacco abuse presented in the ED after being found down.  Patient was admitted for sepsis and subdural hemorrhage.  Neurosurgery was consulted, states no intervention needed.  Findings were concerning for possible embolic ischemic infarcts.  TTE is negative.  Right hip MRI shows fluid collection s/p aspiration by IR,  blood cultures positive for group B streptococci.  ID recommended long-term IV antibiotics.   Significant hospital events:  5/4 Admit after being found down, sepis and SDH. CT ABD/Pelvis > no acute intra-abdominal process,  evidence of sacral decubitus, slight increase compared to prior but no underlying fluid collection, left hip prosthesis   5/5 CT Head > posterior parafalcine SDH.  Cefepime, vanco, flagyl. Remains confused, GP bacteremia on BCID. Family reported 1 wk hx of GI c/o's, fell landing on right hip, concerned he has been drinking more & difficulties with medication identification.  MRI R Hip > large fluid collection surrounding right hip arthroplasty with minimal peripheral enhancement, fluid extends along the femoral component within the medullary cavity of the proximal femur. Small soft tissue defect along the right aspect of the gluteal cleft, bilateral hydroceles. MRI Brain with multifocal acute to subacute ischemic infarcts involving the bilateral cerebral and cerebellar hemispheres, associated mild scattered petechial hemorrhage - possible central thromboembolic etiology. Small subdural hemorrhage without shift. Chronic small vessel disease. Limited TTE b/c of patient cooperation, but no evidence of vegetation, GI DD. ABX changed to ceftriaxone.   5/6 Aspiration of R hip. Ortho aware of findings.    5/8 More  alert, oriented to person. Remains on levophed gtt.   5/9 Cortisol assessed, 13.  Stress dose steroids initiated.   5/10 Remains on 40mcg levo, mental status significantly improved.  SSI added with steroids.   5/11 transfuse 1 unit PRBC, transition off levophed  5/13: TRH resumed care.  5/16: ID suggested PICC line for long term antibiotics.  5/18: Patient had a PICC line,  patient will need long-term antibiotics until June/20.    Assessment & Plan:   Principal Problem:   Septic shock ([place]) Active Problems:   Human immunodeficiency virus (HIV) disease ([place])   AKI (acute kidney injury) ([place])   Lactic acidosis   SDH (subdural hematoma) ([facility])   Hypothermia due to exposure   Cellulitis   Elevated brain natriuretic peptide (BNP) level   Volume overload   Dehydration   Pressure injury of skin   Subdural empyema   Streptococcal bacteremia   Malnutrition of moderate degree   Group G streptococcal bacteremia: Possible sources include Rt hip prosthesis with prior infection, skin/soft tissue.   He has hx. of Streptococcal hip/psoas abscess.  MRI of Rt hip showed large fluid collection.   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   He underwent Rt hip aspiration,  culture negative but had 31K WBC. ID consulted recommended to continue Rocephin until 6/20. Previously seen by Dr. [name] with ortho as outpt.  Ortho is aware. Continue wound care for sacral wound. ID recommended PICC line. Patient had a PICC line, needs ceftriaxone until June 20  Relative adrenal insufficiency. Continued solu cortef 50 mg q6h on 5/12;  if hemodynamics remain stable, then can start to wean off on 5/13. Solu-Cortef tapered  and discontinued.  Subdural hematoma secondary to fall with head trauma monitor neuro status. Seems stable.   Anemia of chronic disease. Thrombocytopenia in setting of sepsis. Transfuse for Hb < 7, PLT < 10K, or significant  bleeding. Globin remains above 8.5. platelet normal.   Thrombocytopenia Platelets improved. monitor for evidence of bleeding   HIV. 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 Lymphopenia noted on CBC on admission with CD4 <35 Continue Prezcobix + Tivicay   Steroid Induced Hyperglycemia Continue SSI  Moderate Malnutrition  Dysphagia. D3 diet, meds crushed in apple sauce Continue speech therapy  History of anal cancer, recent biopsy by Dr. Dema Severin negative for malignancy Continue supportive care.    Deconditioning. - PT/OT  Goals of care. - palliative care consulted.   DVT prophylaxis: SCDs Code Status: Full code. Family Communication: No family at bedside. Disposition Plan:  Status is: Inpatient  Remains inpatient appropriate because:Inpatient level of care appropriate due to severity of illness   Dispo: The patient is from: Home              Anticipated d/c is to: SNF in 1-2 days.              Patient currently is not medically stable to d/c.   Difficult to place patient No   Consultants:   PCCM  Procedures:   Antimicrobials:  Anti-infectives (From admission, onward)   Start     Dose/Rate Route Frequency Ordered Stop   05/21/20 1015  dolutegravir (TIVICAY) tablet 50 mg        50 mg Oral Daily 05/21/20 0920     05/21/20 0800  rilpivirine (EDURANT) tablet 25 mg        25 mg Oral Daily with breakfast 05/20/20 1107     05/20/20 1000  sulfamethoxazole-trimethoprim (BACTRIM DS) 800-160 MG per tablet 1 tablet        1 tablet Oral Daily 05/19/20 1223     05/17/20 1015  sulfamethoxazole-trimethoprim (BACTRIM DS) 800-160 MG per tablet 1 tablet  Status:  Discontinued        1 tablet Per Tube Daily 05/17/20 0924 05/19/20 1223   05/14/20 1945  darunavir-cobicistat (PREZCOBIX) 800-150 MG per tablet 1 tablet       Note to Pharmacy: Crush and give per NG tube   1 tablet Oral Daily 05/14/20 1857     05/14/20 1945  dolutegravir (TIVICAY) tablet 50 mg  Status:   Discontinued        50 mg Per Tube Daily 05/14/20 1857 05/21/20 0920   05/14/20 1500  darunavir-cobicistat (PREZCOBIX) 800-150 MG per tablet 1 tablet  Status:  Discontinued        1 tablet Oral Daily with breakfast 05/14/20 1414 05/14/20 1418   05/14/20 1500  dolutegravir (TIVICAY) tablet 50 mg  Status:  Discontinued        50 mg Per Tube Daily 05/14/20 1414 05/14/20 1418   05/13/20 1300  cefTRIAXone (ROCEPHIN) 2 g in sodium chloride 0.9 % 100 mL IVPB        2 g 200 mL/hr over 30 Minutes Intravenous Every 12 hours 05/13/20 1207     05/13/20 0930  cefTRIAXone (ROCEPHIN) 2 g in sodium chloride 0.9 % 100 mL IVPB  Status:  Discontinued        2 g 200 mL/hr over 30 Minutes Intravenous Every 24 hours 05/13/20 0924 05/13/20 1207   05/12/20 1845  ceFEPIme (MAXIPIME) 2 g in sodium chloride 0.9 % 100 mL IVPB  2 g 200 mL/hr over 30 Minutes Intravenous  Once 05/12/20 1832 05/12/20 2015   05/12/20 1845  metroNIDAZOLE (FLAGYL) IVPB 500 mg        500 mg 100 mL/hr over 60 Minutes Intravenous  Once 05/12/20 1832 05/12/20 2025   05/12/20 1845  vancomycin (VANCOCIN) IVPB 1000 mg/200 mL premix  Status:  Discontinued        1,000 mg 200 mL/hr over 60 Minutes Intravenous  Once 05/12/20 1832 05/12/20 1837   05/12/20 1845  vancomycin (VANCOREADY) IVPB 1500 mg/300 mL        1,500 mg 150 mL/hr over 120 Minutes Intravenous  Once 05/12/20 1838 05/12/20 2225      Subjective: Patient was seen and examined at bedside.  Overnight events noted.   Patient is lying comfortably on the bed,  denies any shortness of breath.   Patient reports pain is better controlled with pain medications. Patient had a PICC line and a Foley catheter.  Objective: Vitals:   05/25/20 1351 05/25/20 2124 05/26/20 0500 05/26/20 0512  BP: (!) 142/73 131/69  131/78  Pulse: 60 63  72  Resp: 18 18  18   Temp: 98.1 F (36.7 C) 98 F (36.7 C)  98.1 F (36.7 C)  TempSrc: Oral Oral  Oral  SpO2: 98% 98%  100%  Weight:   70.1 kg    Height:        Intake/Output Summary (Last 24 hours) at 05/26/2020 1355 Last data filed at 05/26/2020 0800 Gross per 24 hour  Intake 340 ml  Output 2500 ml  Net -2160 ml   Filed Weights   05/21/20 0500 05/26/20 0500  Weight: 76.8 kg 70.1 kg    Examination:  General exam: Appears calm and comfortable , not in any acute distress. Respiratory system: Clear to auscultation. Respiratory effort normal. Cardiovascular system: S1 & S2 heard, RRR. No JVD, murmurs, rubs, gallops or clicks. No pedal edema. Gastrointestinal system: Abdomen is nondistended, soft and nontender. No organomegaly or masses felt.  Normal bowel sounds heard. Central nervous system: Alert and oriented. No focal neurological deficits. Extremities: 1+ pedal edema, no cyanosis no clubbing.  PICC Line noted in right arm. Skin: Sacral wound noted.  Tenderness positive Psychiatry: Judgement and insight appear normal. Mood & affect appropriate.     Data Reviewed: I have personally reviewed following labs and imaging studies  CBC: Recent Labs  Lab 05/20/20 0259 05/21/20 0553 05/22/20 0559 05/23/20 0503 05/24/20 0527 05/25/20 0500  WBC 7.7 9.4 7.4 9.9  --   --   HGB 7.8* 7.7* 8.1* 8.2* 8.0* 8.5*  HCT 24.4* 24.5* 26.1* 25.8* 26.2* 28.0*  MCV 107.0* 110.4* 113.5* 110.3*  --   --   PLT 102* 141* 161 188  --   --    Basic Metabolic Panel: Recent Labs  Lab 05/21/20 0553 05/22/20 0559 05/23/20 0503 05/24/20 0527 05/25/20 0500  NA 144 144 144 145 143  K 3.9 4.1 4.1 4.0 3.8  CL 116* 117* 119* 114* 116*  CO2 25 23 22 22 23   GLUCOSE 94 89 100* 102* 129*  BUN 43* 31* 30* 27* 22*  CREATININE 1.36* 1.36* 1.16 1.02 1.15  CALCIUM 7.8* 7.9* 7.7* 7.8* 7.6*  MG  --  2.1 2.0  --  1.9  PHOS  --  4.1 3.4  --  2.7   GFR: Estimated Creatinine Clearance: 69.4 mL/min (by C-G formula based on SCr of 1.15 mg/dL). Liver Function Tests: Recent Labs  Lab 05/20/20 0259  AST  37  ALT 35  ALKPHOS 63  BILITOT 0.5  PROT  4.8*  ALBUMIN 1.7*   No results for input(s): LIPASE, AMYLASE in the last 168 hours. No results for input(s): AMMONIA in the last 168 hours. Coagulation Profile: No results for input(s): INR, PROTIME in the last 168 hours. Cardiac Enzymes: No results for input(s): CKTOTAL, CKMB, CKMBINDEX, TROPONINI in the last 168 hours. BNP (last 3 results) No results for input(s): PROBNP in the last 8760 hours. HbA1C: No results for input(s): HGBA1C in the last 72 hours. CBG: Recent Labs  Lab 05/25/20 1155 05/25/20 1619 05/25/20 2126 05/26/20 0723 05/26/20 1125  GLUCAP 94 85 98 68* 134*   Lipid Profile: No results for input(s): CHOL, HDL, LDLCALC, TRIG, CHOLHDL, LDLDIRECT in the last 72 hours. Thyroid Function Tests: No results for input(s): TSH, T4TOTAL, FREET4, T3FREE, THYROIDAB in the last 72 hours. Anemia Panel: No results for input(s): VITAMINB12, FOLATE, FERRITIN, TIBC, IRON, RETICCTPCT in the last 72 hours. Sepsis Labs: No results for input(s): PROCALCITON, LATICACIDVEN in the last 168 hours.  Recent Results (from the past 240 hour(s))  Culture, blood (Routine X 2) w Reflex to ID Panel     Status: None   Collection Time: 05/18/20 10:36 AM   Specimen: BLOOD LEFT HAND  Result Value Ref Range Status   Specimen Description   Final    BLOOD LEFT HAND Performed at [facility] [address] [place], [place] [address]    Special Requests   Final    BOTTLES DRAWN AEROBIC ONLY Blood Culture adequate volume Performed at [facility] [address] [place], [place] [address]    Culture   Final    NO GROWTH 5 DAYS Performed at [facility], [place] [address], [place], [place] [address]    Report Status 05/23/2020 FINAL  Final  Culture, blood (Routine X 2) w Reflex to ID Panel     Status: None   Collection Time: 05/18/20 10:36 AM   Specimen: BLOOD LEFT HAND  Result Value Ref Range Status   Specimen Description   Final    BLOOD LEFT  HAND Performed at [facility] [address] [place], [place] [address]    Special Requests   Final    BOTTLES DRAWN AEROBIC ONLY Blood Culture results may not be optimal due to an inadequate volume of blood received in culture bottles Performed at [facility] [address] [place], [place] [address]    Culture   Final    NO GROWTH 5 DAYS Performed at [facility], [place] [address], [place], [place] [address]    Report Status 05/23/2020 FINAL  Final    Radiology Studies: No results found.   Scheduled Meds: . sodium chloride   Intravenous Once  . chlorhexidine  15 mL Mouth Rinse BID  . Chlorhexidine Gluconate Cloth  6 each Topical Daily  . collagenase   Topical Daily  . darunavir-cobicistat  1 tablet Oral Daily  . dolutegravir  50 mg Oral Daily  . feeding supplement  237 mL Oral TID BM  . insulin aspart  0-5 Units Subcutaneous QHS  . insulin aspart  0-9 Units Subcutaneous TID WC  . mouth rinse  15 mL Mouth Rinse q12n4p  . rilpivirine  25 mg Oral Q breakfast  . sodium chloride flush  10-40 mL Intracatheter Q12H  . sulfamethoxazole-trimethoprim  1 tablet Oral Daily   Continuous Infusions: . cefTRIAXone (ROCEPHIN)  IV  2 g (05/26/20 1003)  . lactated ringers 75 mL/hr at 05/26/20 0048     LOS: 13 days    Time spent: 25 mins.    Shawna Clamp, MD [facility]   If 7PM-7AM, please contact night-coverage

## 2020-05-27 LAB — CBC
HCT: 24.1 % — ABNORMAL LOW (ref 39.0–52.0)
Hemoglobin: 7.4 g/dL — ABNORMAL LOW (ref 13.0–17.0)
MCH: 34.3 pg — ABNORMAL HIGH (ref 26.0–34.0)
MCHC: 30.7 g/dL (ref 30.0–36.0)
MCV: 111.6 fL — ABNORMAL HIGH (ref 80.0–100.0)
Platelets: 145 10*3/uL — ABNORMAL LOW (ref 150–400)
RBC: 2.16 MIL/uL — ABNORMAL LOW (ref 4.22–5.81)
RDW: 18.7 % — ABNORMAL HIGH (ref 11.5–15.5)
WBC: 6.4 10*3/uL (ref 4.0–10.5)
nRBC: 0 % (ref 0.0–0.2)

## 2020-05-27 LAB — BASIC METABOLIC PANEL
Anion gap: 1 — ABNORMAL LOW (ref 5–15)
BUN: 15 mg/dL (ref 6–20)
CO2: 24 mmol/L (ref 22–32)
Calcium: 7.4 mg/dL — ABNORMAL LOW (ref 8.9–10.3)
Chloride: 114 mmol/L — ABNORMAL HIGH (ref 98–111)
Creatinine, Ser: 1.1 mg/dL (ref 0.61–1.24)
GFR, Estimated: >60 mL/min (ref >60)
Glucose, Bld: 79 mg/dL (ref 70–99)
Potassium: 3.4 mmol/L — ABNORMAL LOW (ref 3.5–5.1)
Sodium: 139 mmol/L (ref 135–145)

## 2020-05-27 LAB — GLUCOSE, CAPILLARY
Glucose-Capillary: 83 mg/dL (ref 70–99)
Glucose-Capillary: 89 mg/dL (ref 70–99)
Glucose-Capillary: 85 mg/dL (ref 70–99)

## 2020-05-27 LAB — MAGNESIUM: Magnesium: 1.6 mg/dL — ABNORMAL LOW (ref 1.7–2.4)

## 2020-05-27 LAB — PHOSPHORUS: Phosphorus: 1.9 mg/dL — ABNORMAL LOW (ref 2.5–4.6)

## 2020-05-27 MED ORDER — MORPHINE SULFATE (PF) 2 MG/ML IV SOLN
1.0000 mg | INTRAVENOUS | Status: DC | PRN
Start: 2020-05-27 — End: 2020-05-28

## 2020-05-27 NOTE — TOC Progression Note (Signed)
Transition of Care [facility]) - Progression Note    Patient Details  Name: [name] [name] MRN: [id number] Date of Birth: 1960/08/11  Transition of [facility] CM/SW [place], [place] Phone Number: 05/27/2020, 10:45 AM  Clinical Narrative:   Patient and family choose [facility]. Insurance authorization request initiated. Navi reference [id number]. TOC will continue to follow during the course of hospitalization.     Expected Discharge Plan: Skilled Nursing Facility Barriers to Discharge: SNF Pending bed offer  Expected Discharge Plan and Services Expected Discharge Plan: [place]   Discharge Planning Services: CM Consult Post Acute Care Choice: [place] Living arrangements for the past 2 months: Apartment                                       Social Determinants of Health (SDOH) Interventions    Readmission Risk Interventions No flowsheet data found.

## 2020-05-27 NOTE — Progress Notes (Signed)
Occupational Therapy Treatment Patient Details Name: [name] [name] MRN: [id number] DOB: 11/10/1960 Today's Date: 05/27/2020    History of present illness Patient is a 60 year old male admitted 5/4 after being found down by family. Admitted with sepsis, SDH from fall,H/O R hip replacement and multiple I and Ds, multiple wound sites. MRI reading multifocal acute to subacute ischemic infarcts involving the bilateral cerebral and cerebellar hemispheres,  PMH significant for HIV, ETOH use, Rectal cancer, tobacco use   OT comments  Patient supine in bed and agreeable to therapy when therapist entered the room. Treatment initially focused on movement of upper extremities (grossly functional except for right shoulder which appears to have a chronic rotator cuff injury) - and patient able to use upper extremities to assist with rolling in bed on rails. Patient found to be incontinent of BM and required total assist for pericare and max-total assist for rolling. Patient's LE stiff and patient reporting pain with LE movement. Patient found to have rectal wound below that of sacral wound and charge nurse asked to come into room to assess. Wound cleaned and dressed by RN. Patient cleaned and provided with clean gown - with therapist cueing patient move arms and legs as tolerated. Patient positioned into chair position and unable to assist with scooting due to pain. Attempt to egress from end of bed aborted and patient transferred to the side with max assist with patient needing assistance for balance and LEs - patient using upper extremities to propr and hold onto bed rail. Patient able to sit edge of bed and then use of stedy for standing. neede assistance to reach forward and place hands on stedy rail. +2 assistance for sit to stand and +1 assistance to maintain standing x 3 minutes. Total assist to return to supine and patient placed on his left side to off load buttocks due to wounds. Patient making excellent  progress today and predominantly limited by pain/wounds. Continue to recommend short term rehab at discharge.    Follow Up Recommendations  SNF    Equipment Recommendations  None recommended by OT    Recommendations for Other Services      Precautions / Restrictions Precautions Precautions: Fall Precaution Comments: multiple wound sites (B heels, sacrum, rectal , very painful with minimal movement of right  UE, R ankle, R hip Restrictions Weight Bearing Restrictions: No       Mobility Bed Mobility Overal bed mobility: Needs Assistance Bed Mobility: Rolling Rolling: Max assist;Total assist;+2 for physical assistance         General bed mobility comments: Worked on patient assisting with rolling - patient able to use upper extremities on bed rail rolilng to the right - though he needed assistance with BLEs. Patient's LEs stiff and patient not tolerating knee flexion position in bed. Initial plan was to egress out the end of the bed but unable so egressed out the side. Patient requiring assistance of UEs to assist with balance and assistance of therapist. Patient initially leaning posteriorly and then to the left. Use of stedy for standing. +2 assistance for safety and initial sit to stand.    Transfers                 General transfer comment: used STEDY for standing.  Did NOT transfer to recliner due to uncontrolled loose stools and pressure sores on sacrum and new finding skin break down at rectal area.    Balance Overall balance assessment: Needs assistance Sitting-balance support: Bilateral upper extremity  supported Sitting balance-Leahy Scale: Poor   Postural control: Posterior lean;Left lateral lean Standing balance support: During functional activity Standing balance-Leahy Scale: Poor                             ADL either performed or assessed with clinical judgement   ADL                                                Vision Patient Visual Report: No change from baseline Vision Assessment?: No apparent visual deficits   Perception     Praxis      Cognition Arousal/Alertness: Awake/alert Behavior During Therapy: WFL for tasks assessed/performed Overall Cognitive Status: Within Functional Limits for tasks assessed                                 General Comments: Alert to self and knows he is in the hospital. Answers questions appropiately but does not appear to have insight into his deficits or medical condition. Some vagueness of answers and lack of detail with questioning in regards to righ shoulder weakness/injury (?) and how long he may have had rectal/buttock wounds.        Exercises Other Exercises Other Exercises: Promoted upper extremity movement initially. Patient found to have weak right shoulder with hx of some injury patient could not report effectively. 3-/5 strength. Otherwise patient's UEs functional. Patient able to use arms for propping and pulilng on bed rails.   Shoulder Instructions       General Comments      Pertinent Vitals/ Pain       Pain Assessment: Faces Faces Pain Scale: Hurts even more Pain Location: general + buttocks Pain Descriptors / Indicators: Discomfort;Grimacing Pain Intervention(s): Monitored during session;Premedicated before session  Home Living                                          Prior Functioning/Environment              Frequency  Min 2X/week        Progress Toward Goals  OT Goals(current goals can now be found in the care plan section)  Progress towards OT goals: Progressing toward goals  Acute Rehab OT Goals Patient Stated Goal: Did not state OT Goal Formulation: With patient Time For Goal Achievement: 06/03/20 Potential to Achieve Goals: [place] Discharge plan remains appropriate    Co-evaluation    PT/OT/SLP Co-Evaluation/Treatment: Yes Reason for Co-Treatment: For  patient/therapist safety;To address functional/ADL transfers;Complexity of the patient's impairments (multi-system involvement) PT goals addressed during session: Mobility/safety with mobility OT goals addressed during session: ADL's and self-care;Strengthening/ROM (functional mobility)      AM-PAC OT "6 Clicks" Daily Activity     Outcome Measure   Help from another person eating meals?: A Lot Help from another person taking care of personal grooming?: A Lot Help from another person toileting, which includes using toliet, bedpan, or urinal?: Total Help from another person bathing (including washing, rinsing, drying)?: A Lot Help from another person to put on and taking off regular upper body clothing?: A Lot Help from another person to put on  and taking off regular lower body clothing?: Total 6 Click Score: 10    End of Session    OT Visit Diagnosis: Other abnormalities of gait and mobility (R26.89);Muscle weakness (generalized) (M62.81);History of falling (Z91.81);Other symptoms and signs involving cognitive function;Pain   Activity Tolerance Patient limited by pain   Patient Left in bed;with call bell/phone within reach   Nurse Communication Mobility status (rectal wound)        Time: 9509-3267 OT Time Calculation (min): 50 min  Charges: OT General Charges $OT Visit: 1 Visit OT Treatments $Therapeutic Activity: 8-22 mins  Derl Barrow, OTR/L [place]  Office [phone] Pager: [place] 05/27/2020, 3:57 PM

## 2020-05-27 NOTE — Progress Notes (Addendum)
PROGRESS NOTE    [name] [name]  DVV:[id number] DOB: 08-01-1960 DOA: 05/12/2020 PCP: [name], MD    Brief Narrative:  [name] [name] was admitted to the hospital with working diagnosis of septic shock due to group B streptococcus bacteremia.  60 year old male past medical history for HIV, DVT left leg, chronic anemia, rectal cancer and arthritis who was found down at home in the bathroom for unknown time.  Last night patient was seen at his usual 2 days prior to hospitalization.  On his initial physical examination he was hypoxemic he was placed on supplemental oxygen, while in the emergency department he became hypotensive and required vasopressors. His blood pressure 125/80, heart rate 103, temperature 98.9, respiratory rate 30, oxygen saturation 100% he was awake and alert, his lungs were clear to auscultation, heart S1-S2, present, rhythmic, soft abdomen, no lower extremity edema.  He had unstageable sacral pressure ulcer.  His pH was 7.30, PCO2 31.5 PO2 331, bicarb 10.4, sodium 149, potassium 4.7, chloride 117, BUN 15, creatinine 102, BUN 70, creatinine 2.0, AST 101, ALT 66, CK4 149, white count 15.5, hemoglobin 15.0, hematocrit 45.0, platelets 77.  Head CT with 2 mm posterior falcine subdural hematoma, no mass-effect no acute infarct.  Patient was placed on broad-spectrum antibiotic therapy.  Blood cultures positive for group B streptococcus.  Right hip MRI with large fluid collection surrounding right hip arthroplasty. Brain MRI with multifocal acute to subacute ischemic infarcts involving the bilateral cerebral and cerebellar hemispheres.  05/06 right hip aspiration per orthopedics.  He required stress of steroids and packed red blood cell transfusion, eventually 5/11 he was weaned off vasopressors.  5/13 transferred to [facility].   1. Septic shock due to streptococcal bacteremia/ septic emboli to the brain. Patient is hemodynamically stable.  Wbc is 6,4 and has remained  afebrile. Follow up blood cultures from 05.10 with no growth.  No clear hardware infection, plan to continue with IV antibiotic therapy for 6 weeks, to complete on 06/28/20. Patient has a PICC line, plan to transfer to SNF in am.   2. Adrenal insufficiency . Clinically resolved, no off steroids.   3. Subdural hematoma. Continue neuro checks per unit protocol. Continue PT and OT at SNF.   4. Chronic anemia of chronic disease. Thrombocytopenia. Multiple skin wounds elbow, heals, shoulders lesions and decubitus ulcer (not able to stage)present on admission.   hgb today at 7,4. , will continue local wound care.  Sp 1 unit PRBC transfusion on 05/11.  Continue with dysphagia 3 diet, for swallow dysfunction. Continue analgesics for pain control during wound care.    5. HIV/ moderate calorie protein malnutrition Continue with antiretroviral therapy.  Prophylaxis with bactrim.   6. Hx of anal cancer. Follow up as outpatient,. Recent biopsy negative.   7. Steroid induced hyperglycemia. Hgb A1c 5,5. Glucose has remained stable, at 79 capillary and fasting 79. Discontinue insulin for now.   8. AKI. Renal function with serum cr down to 1,10 from peak 1,36. Avoid hypotension and nephrotoxic medications,.   Assessment & Plan:   Principal Problem:   Septic shock ([place]) Active Problems:   Human immunodeficiency virus (HIV) disease ([place])   AKI (acute kidney injury) ([place])   Lactic acidosis   SDH (subdural hematoma) ([facility])   Hypothermia due to exposure   Cellulitis   Elevated brain natriuretic peptide (BNP) level   Volume overload   Dehydration   Pressure injury of skin   Subdural empyema   Streptococcal bacteremia   Malnutrition of  moderate degree    Status is: Inpatient  Remains inpatient appropriate because:IV treatments appropriate due to intensity of illness or inability to take PO   Dispo: The patient is from: Home              Anticipated d/c is to: SNF              Patient  currently is medically stable to d/c.   Difficult to place patient No    DVT prophylaxis: Enoxaparin   Code Status:   full  Family Communication:  No family at the bedside      Nutrition Status: Nutrition Problem: Moderate Malnutrition Etiology: chronic illness (HIV/AIDS, hx of rectal cancer) Signs/Symptoms: moderate fat depletion,moderate muscle depletion,severe muscle depletion Interventions: Ensure Enlive (each supplement provides 350kcal and 20 grams of protein)     Skin Documentation: Pressure Injury 05/13/20 Knee Anterior;Left;Right Stage 1 -  Intact skin with non-blanchable redness of a localized area usually over a bony prominence. stage 1 pressure some mottling present (Active)  05/13/20 0600  Location: Knee  Location Orientation: Anterior;Left;Right  Staging: Stage 1 -  Intact skin with non-blanchable redness of a localized area usually over a bony prominence.  Wound Description (Comments): stage 1 pressure some mottling present  Present on Admission:      Pressure Injury 05/13/20 Back Upper;Medial Deep Tissue Pressure Injury - Purple or maroon localized area of discolored intact skin or blood-filled blister due to damage of underlying soft tissue from pressure and/or shear. (Active)  05/13/20   Location: Back  Location Orientation: Upper;Medial  Staging: Deep Tissue Pressure Injury - Purple or maroon localized area of discolored intact skin or blood-filled blister due to damage of underlying soft tissue from pressure and/or shear.  Wound Description (Comments):   Present on Admission: Yes     Pressure Injury 05/13/20 Heel Left  (Active)  05/13/20 0600  Location: Heel  Location Orientation: Left  Staging:   Wound Description (Comments):   Present on Admission:      Pressure Injury Heel Right (Active)     Location: Heel  Location Orientation: Right  Staging:   Wound Description (Comments):   Present on Admission:      Pressure Injury 05/13/20 Elbow  Posterior;Right Stage 2 -  Partial thickness loss of dermis presenting as a shallow open injury with a red, pink wound bed without slough. (Active)  05/13/20 0600  Location: Elbow  Location Orientation: Posterior;Right  Staging: Stage 2 -  Partial thickness loss of dermis presenting as a shallow open injury with a red, pink wound bed without slough.  Wound Description (Comments):   Present on Admission:      Pressure Injury 05/13/20 Elbow Left;Posterior Stage 1 -  Intact skin with non-blanchable redness of a localized area usually over a bony prominence. (Active)  05/13/20 0600  Location: Elbow  Location Orientation: Left;Posterior  Staging: Stage 1 -  Intact skin with non-blanchable redness of a localized area usually over a bony prominence.  Wound Description (Comments):   Present on Admission: Yes     Pressure Injury 05/13/20 Shoulder Right Deep Tissue Pressure Injury - Purple or maroon localized area of discolored intact skin or blood-filled blister due to damage of underlying soft tissue from pressure and/or shear. (Active)  05/13/20   Location: Shoulder  Location Orientation: Right  Staging: Deep Tissue Pressure Injury - Purple or maroon localized area of discolored intact skin or blood-filled blister due to damage of underlying soft tissue from pressure and/or shear.  Wound Description (Comments):   Present on Admission: Yes     Pressure Injury 05/13/20 Sacrum Mid Unstageable - Full thickness tissue loss in which the base of the injury is covered by slough (yellow, tan, gray, green or brown) and/or eschar (tan, brown or black) in the wound bed. (Active)  05/13/20   Location: Sacrum  Location Orientation: Mid  Staging: Unstageable - Full thickness tissue loss in which the base of the injury is covered by slough (yellow, tan, gray, green or brown) and/or eschar (tan, brown or black) in the wound bed.  Wound Description (Comments):   Present on Admission: Yes     Consultants:    ID   Orthopedics   Procedures:   Right hip aspiration   Antimicrobials:   Ceftriaxone     Subjective: Patient continue to be very weak and deconditioned, no nausea or vomiting, no dyspnea or chest pain.   Objective: Vitals:   05/26/20 0512 05/26/20 2054 05/27/20 0508 05/27/20 1422  BP: 131/78 130/72 (!) 155/70 (!) 116/59  Pulse: 72 75 73 69  Resp: 18 17 19 20   Temp: 98.1 F (36.7 C) 98.6 F (37 C) 98.5 F (36.9 C) 98.2 F (36.8 C)  TempSrc: Oral Oral Oral Oral  SpO2: 100% 100% 96% 96%  Weight:   72.1 kg   Height:        Intake/Output Summary (Last 24 hours) at 05/27/2020 1437 Last data filed at 05/27/2020 0931 Gross per 24 hour  Intake 6114.49 ml  Output 2100 ml  Net 4014.49 ml   Filed Weights   05/26/20 0500 05/27/20 0508  Weight: 70.1 kg 72.1 kg    Examination:   General: Not in pain or dyspnea  Neurology: Awake and alert, non focal  E ENT: mild pallor, no icterus, oral mucosa moist Cardiovascular: No JVD. S1-S2 present, rhythmic, no gallops, rubs, or murmurs. No lower extremity edema. Pulmonary: vesicular breath sounds bilaterally, adequate air movement, no wheezing, rhonchi or rales. Gastrointestinal. Abdomen soft and non tender Skin. Multiple pressure wounds, larges at sacrum, not able to stage, dressing in place.  Musculoskeletal: no joint deformities     Data Reviewed: I have personally reviewed following labs and imaging studies  CBC: Recent Labs  Lab 05/21/20 0553 05/22/20 0559 05/23/20 0503 05/24/20 0527 05/25/20 0500 05/27/20 0409  WBC 9.4 7.4 9.9  --   --  6.4  HGB 7.7* 8.1* 8.2* 8.0* 8.5* 7.4*  HCT 24.5* 26.1* 25.8* 26.2* 28.0* 24.1*  MCV 110.4* 113.5* 110.3*  --   --  111.6*  PLT 141* 161 188  --   --  [id number]*   Basic Metabolic Panel: Recent Labs  Lab 05/22/20 0559 05/23/20 0503 05/24/20 0527 05/25/20 0500 05/27/20 0409  NA 144 144 145 143 139  K 4.1 4.1 4.0 3.8 3.4*  CL 117* 119* 114* 116* 114*  CO2 23 22 22 23 24    GLUCOSE 89 100* 102* 129* 79  BUN 31* 30* 27* 22* 15  CREATININE 1.36* 1.16 1.02 1.15 1.10  CALCIUM 7.9* 7.7* 7.8* 7.6* 7.4*  MG 2.1 2.0  --  1.9 1.6*  PHOS 4.1 3.4  --  2.7 1.9*   GFR: Estimated Creatinine Clearance: 74.6 mL/min (by C-G formula based on SCr of 1.1 mg/dL). Liver Function Tests: No results for input(s): AST, ALT, ALKPHOS, BILITOT, PROT, ALBUMIN in the last 168 hours. No results for input(s): LIPASE, AMYLASE in the last 168 hours. No results for input(s): AMMONIA in the last 168 hours. Coagulation Profile:  No results for input(s): INR, PROTIME in the last 168 hours. Cardiac Enzymes: No results for input(s): CKTOTAL, CKMB, CKMBINDEX, TROPONINI in the last 168 hours. BNP (last 3 results) No results for input(s): PROBNP in the last 8760 hours. HbA1C: No results for input(s): HGBA1C in the last 72 hours. CBG: Recent Labs  Lab 05/26/20 1125 05/26/20 1626 05/26/20 2056 05/27/20 0739 05/27/20 1118  GLUCAP 134* 108* 106* 83 89   Lipid Profile: No results for input(s): CHOL, HDL, LDLCALC, TRIG, CHOLHDL, LDLDIRECT in the last 72 hours. Thyroid Function Tests: No results for input(s): TSH, T4TOTAL, FREET4, T3FREE, THYROIDAB in the last 72 hours. Anemia Panel: No results for input(s): VITAMINB12, FOLATE, FERRITIN, TIBC, IRON, RETICCTPCT in the last 72 hours.    Radiology Studies: I have reviewed all of the imaging during this hospital visit personally     Scheduled Meds: . sodium chloride   Intravenous Once  . chlorhexidine  15 mL Mouth Rinse BID  . Chlorhexidine Gluconate Cloth  6 each Topical Daily  . collagenase   Topical Daily  . darunavir-cobicistat  1 tablet Oral Daily  . dolutegravir  50 mg Oral Daily  . feeding supplement  237 mL Oral TID BM  . insulin aspart  0-5 Units Subcutaneous QHS  . insulin aspart  0-9 Units Subcutaneous TID WC  . mouth rinse  15 mL Mouth Rinse q12n4p  . rilpivirine  25 mg Oral Q breakfast  . sodium chloride flush  10-40  mL Intracatheter Q12H  . sulfamethoxazole-trimethoprim  1 tablet Oral Daily   Continuous Infusions: . cefTRIAXone (ROCEPHIN)  IV 2 g (05/27/20 1021)  . lactated ringers 75 mL/hr at 05/27/20 0433     LOS: 14 days        Jonathen Rathman Gerome Apley, MD

## 2020-05-27 NOTE — Progress Notes (Signed)
Physical Therapy Treatment Patient Details Name: [name] [name] MRN: [id number] DOB: 04-22-1960 Today's Date: 05/27/2020    History of Present Illness Patient is a 59 year old male admitted 5/4 after being found down by family. Admitted with sepsis, SDH from fall,H/O R hip replacement and multiple I and Ds, multiple wound sites. MRI reading multifocal acute to subacute ischemic infarcts involving the bilateral cerebral and cerebellar hemispheres,  PMH significant for HIV, ETOH use, Rectal cancer, tobacco use    PT Comments    Pt was pre medicated for pain.  Pt in bed found incont loose stools.  Assisted with peri care.  Very sensitive and painful rectal area.  Pt almost prone in order to properly clean when noticed new area bright red at rectum area that also involved approx 7 cm distal to scrotum.  RN called to room to access.  RN irrigated with saline and covered. General bed mobility comments: Max/Total Assist to roll and reposition and scoot to [facility] pt < 5%.  Very rigid throughout.  Severe extensor tone. Assisted to EOB by placing bed in chair position as pt grimaced with discomfort.  Severe posterior lean.  Required increased time and AROM B LE to partially achieve 90/90 seated position.  Unable to exit at end of bed, assisted with 1/4 pivot swival to exit of side of bed.  Pt sat EOB x 5 min and required physical assist to prevent posterior LOB.  Poor sitting balance with delayed correction reaction as well as inability to achieve lidline.  Required MAX Assist to maintain upright 90/90 sitting.  Placed STEDY in front of pt required manual assist to place B LE's properly and manual assist to "lean forward" to place B UE's on pull up bar.  Limited AROM R shoulder.  Use a long bed sheet placed around pt's waist to pull him upright and used STEDY to assist with standing.  Pt was able to static stand approx 3 min but required MOD/MAX Assist to prevent lateral lean.  Surprisingly, pt was not dizzy and  tolerated well.  Then assisted back to bed, pt required TOTAL ASSIST due to fatigue/weakness to position to LEFT sidelying. Pt will need ST Rehab at SNF  Follow Up Recommendations  SNF     Equipment Recommendations  None recommended by PT    Recommendations for Other Services       Precautions / Restrictions Precautions Precautions: Fall Precaution Comments: multiple wound sites (B heels, sacrum, rectal , very painful with minimal movement of right  UE, R ankle, R hip Restrictions Weight Bearing Restrictions: No    Mobility  Bed Mobility Overal bed mobility: Needs Assistance Bed Mobility: Rolling Rolling: Max assist;Total assist;+2 for physical assistance         General bed mobility comments: Max/Total Assist to roll and reposition and scoot to [facility] pt < 5%.  Very rigid throughout.  Severe extensor tone. Assisted to EOB by placing bed in chair position as pt grimaced with discomfort.  Severe posterior lean.  Required increased time and AROM B LE to partially achieve 90/90 seated position.  Unable to exit at end of bed, assisted with 1/4 pivot swival to exit of side of bed.  Pt sat EOB x 5 min and required physical assist to prevent posterior LOB.  Poor sitting balance with delayed correction reaction as well as inability to achieve lidline.  Required MAX Assist to maintain upright 90/90 sitting.  Placed STEDY in front of pt required manual assist to place  B LE's properly and manual assist to "lean forward" to place B UE's on pull up bar.  Limited AROM R shoulder.  Use a long bed sheet placed around pt's waist to pull him upright and used STEDY to assist with standing.  Pt was able to static stand approx 3 min but required MOD/MAX Assist to prevent lateral lean.  Surprisingly, pt was not dizzy and tolerated well.  Then assisted back to bed, pt required TOTAL ASSIST due to fatigue/weakness to position to LEFT sidelying.    Transfers                 General transfer comment: used  STEDY for standing.  Did NOT transfer to recliner due to uncontrolled loose stools and pressure sores on sacrum and new finding skin break down at rectal area.  Ambulation/Gait                 Stairs             Wheelchair Mobility    Modified Rankin (Stroke Patients Only)       Balance                                            Cognition Arousal/Alertness: Awake/alert Behavior During Therapy: WFL for tasks assessed/performed;Flat affect Overall Cognitive Status: Within Functional Limits for tasks assessed                                 General Comments: AxO x 2 flat affect but will converse.  Present with slow/delayed responces and does not appear to have a full understanding of his medical condition. 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      Exercises      General Comments        Pertinent Vitals/Pain Pain Assessment: Faces Faces Pain Scale: Hurts even more Pain Location: general + buttocks Pain Descriptors / Indicators: Discomfort;Grimacing Pain Intervention(s): Monitored during session;Premedicated before session    Home Living                      Prior Function            PT Goals (current goals can now be found in the care plan section) Progress towards PT goals: Progressing toward goals    Frequency    Min 2X/week      PT Plan Current plan remains appropriate    Co-evaluation PT/OT/SLP Co-Evaluation/Treatment: Yes Reason for Co-Treatment: Complexity of the patient's impairments (multi-system involvement);For patient/therapist safety PT goals addressed during session: Mobility/safety with mobility        AM-PAC PT "6 Clicks" Mobility   Outcome Measure  Help needed turning from your back to your side while in a flat bed without using bedrails?: Total Help needed moving from lying on your back to sitting on the side of a flat bed without using bedrails?: Total Help  needed moving to and from a bed to a chair (including a wheelchair)?: Total Help needed standing up from a chair using your arms (e.g., wheelchair or bedside chair)?: Total Help needed to walk in hospital room?: Total Help needed climbing 3-5 steps with a railing? : Total 6 Click Score: 6    End of Session Equipment Utilized During Treatment: Gait belt  Activity Tolerance: Patient limited by pain;Patient limited by fatigue Patient left: in bed;with call bell/phone within reach Nurse Communication: Mobility status (called RN into room to view new skin break down at rectal site) PT Visit Diagnosis: Difficulty in walking, not elsewhere classified (R26.2);Pain Pain - Right/Left: Right Pain - part of body: Shoulder;Hip;Ankle and joints of foot     Time: 1314-1404 PT Time Calculation (min) (ACUTE ONLY): 50 min  Charges:  $Gait Training: 8-22 mins $Therapeutic Activity: 23-37 mins                     Rica Koyanagi  PTA Acute  Rehabilitation Services Pager      [phone] Office      [phone]

## 2020-05-27 NOTE — Progress Notes (Signed)
  Speech Language Pathology Treatment: Dysphagia  Patient Details Name: [name] [name] MRN: [id number] DOB: [birth year] Today's Date: 05/27/2020 Time: 4462-8638 SLP Time Calculation (min) (ACUTE ONLY): 8 min  Assessment / Plan / Recommendation Clinical Impression  Fed pt some fruit and tea off his mostly untouched lunch tray. Pt masticated well. Had a brief oral hold with liquids. No difficulty reported, recommend pt continue a soft diet. No SLP f/u needed at this time.   HPI HPI: adm w/AMS d/t sepsis- fall possibly after ETOH consumption, down for 2 days , found to have a small subdural hemorrhage, bilateral cerebral and cerebellar hemispheres events. Pt also with hypothermal upon admit - bear hugger, has sacral wound with h/o Rectal ca s/p radiation, HIV.  He has Dohoff tube in place and has demonstrated improved mentation thus for swallow eval ordered,  Lungs were clear at admit, now with rhonchi, CT neck Prominent spondylosis at C5-6 and C6-7. Mild diffuse facet hypertrophy.      SLP Plan  All goals met       Recommendations  Diet recommendations: Dysphagia 3 (mechanical soft);Thin liquid Liquids provided via: Cup;Straw Medication Administration: Crushed with puree Supervision: Staff to assist with self feeding Compensations: Slow rate;Small sips/bites;Other (Comment) Postural Changes and/or Swallow Maneuvers: Seated upright 90 degrees;Upright 30-60 min after meal                Oral Care Recommendations: Oral care BID Follow up Recommendations: Skilled Nursing facility Plan: All goals met       GO                Gervase Colberg, Katherene Ponto 05/27/2020, 1:20 PM

## 2020-05-27 NOTE — Care Management Important Message (Signed)
Important Message  Patient Details IM Letter given to the Patient. Name: [name] [name] MRN: [id number] Date of Birth: 08-17-1960   Medicare Important Message Given:  Yes     [name] 05/27/2020, 10:28 AM

## 2020-05-27 NOTE — Progress Notes (Signed)
[facility] for Infectious Disease   Reason for visit: Follow up on bacteremia  Interval History: no acute events, no family at bedside.  No fever, WBC wnl. picc line placed yesterday Day 15 total antibiotics Day 14 ceftriaxone  Physical Exam: Constitutional:  Vitals:   05/27/20 0508 05/27/20 1422  BP: (!) 155/70 (!) 116/59  Pulse: 73 69  Resp: 19 20  Temp: 98.5 F (36.9 C) 98.2 F (36.8 C)  SpO2: 96% 96%  patient is in no acute distress Respiratory: normal respiratory effort, CTA B Cardiovascular: RRR GI: soft, nt  Review of Systems: Constitutional: negative for fever or chills Gastrointestinal: negative for diarrhea  Lab Results  Component Value Date   WBC 6.4 05/27/2020   HGB 7.4 (L) 05/27/2020   HCT 24.1 (L) 05/27/2020   MCV 111.6 (H) 05/27/2020   PLT 145 (L) 05/27/2020    Lab Results  Component Value Date   CREATININE 1.10 05/27/2020   BUN 15 05/27/2020   NA 139 05/27/2020   K 3.4 (L) 05/27/2020   CL 114 (H) 05/27/2020   CO2 24 05/27/2020    Lab Results  Component Value Date   ALT 35 05/20/2020   AST 37 05/20/2020   ALKPHOS 63 05/20/2020     Microbiology: Recent Results (from the past 240 hour(s))  Culture, blood (Routine X 2) w Reflex to ID Panel     Status: None   Collection Time: 05/18/20 10:36 AM   Specimen: BLOOD LEFT HAND  Result Value Ref Range Status   Specimen Description   Final    BLOOD LEFT HAND Performed at [facility], [place] [address] [place], [place] [address]    Special Requests   Final    BOTTLES DRAWN AEROBIC ONLY Blood Culture adequate volume Performed at [place] [address] [place], [place] [address]    Culture   Final    NO GROWTH 5 DAYS Performed at [facility], [place] [address] [place], [place] [address]    Report Status 05/23/2020 FINAL  Final  Culture, blood (Routine X 2) w Reflex to ID Panel     Status: None   Collection Time: 05/18/20 10:36 AM    Specimen: BLOOD LEFT HAND  Result Value Ref Range Status   Specimen Description   Final    BLOOD LEFT HAND Performed at [place] [address] [place], [place] [address]    Special Requests   Final    BOTTLES DRAWN AEROBIC ONLY Blood Culture results may not be optimal due to an inadequate volume of blood received in culture bottles Performed at [place] [address] [place], [place] [address]    Culture   Final    NO GROWTH 5 DAYS Performed at [facility], [place] [address], [place], [place] [address]    Report Status 05/23/2020 FINAL  Final    Impression/Plan:  1. Right hip effusion - no plan for surgery reported and possible infection.  On antibiotics for Strep with bacteremia and plan for 6 weeks through June 20th along with other infected sources.   Will need follow up with Dr. [name]  2.  Septic CNS emboli - group G Strep in blood cultures and on high dose ceftriaxone.  No changes and treatment through June 20th.    3. HIV - he has been back on his home regimen with Prezcobix, Tivicay and rilpivirine and will continue.  Follow up  arranged with [name] as an outpatient to recheck labs.   4.  At risk for opportunistic infections - on Bactrim for PJP prophylaxis and will continue.   5. Bilateral feet lesions - ulcerations c/w pressure injuries.    6.  Decubitus ulcer - pressure injury and was present on admisision. Will need continued efforts at wound care, offloading and nutrition support at rehab.

## 2020-05-27 NOTE — Progress Notes (Signed)
Nutrition Follow-up  DOCUMENTATION CODES:   Non-severe (moderate) malnutrition in context of chronic illness,Underweight  INTERVENTION:   - Ensure Enlive po TID, each supplement provides 350 kcal and 20 grams of protein   NUTRITION DIAGNOSIS:   Moderate Malnutrition related to chronic illness (HIV/AIDS, hx of rectal cancer) as evidenced by moderate fat depletion,moderate muscle depletion,severe muscle depletion.  Ongoing.  GOAL:   Patient will meet greater than or equal to 90% of their needs  Progressing.  MONITOR:   PO intake, supplement acceptance, Labs,Weight trends,Skin  ASSESSMENT:   60 y.o. male with medical history of HIV/AIDS, arthritis, gout, hepatitis B, anemia, and squamous cell rectal cancer s/p radiation. He presented to the ED via EMS after being found down with unknown amount of time down. He had been seen 2 days PTA. CT head in the ED showed small subdural hematoma. Neurosurgery saw him in the ED and signed off.  5/4: admitted  5/6: small bore NGT placed, TF initiated 5/11: NGT removed, diet advanced  Patient currently consuming 50-75% of meals. Drinking Ensure supplements.  Per chart review, plan is for discharge to SNF.  Admission weight: 141 lbs Current weight: 158 lbs  I/Os: +5.1L since 5/5 UOP: 2/4L x 24 hrs  Medications: Lactated ringers  Labs reviewed: CBGs: 83-134 Low K, Phos, Mg  Diet Order:   Diet Order            DIET DYS 3 Room service appropriate? Yes; Fluid consistency: Thin  Diet effective now                 EDUCATION NEEDS:   Not appropriate for education at this time  Skin:  Skin Assessment: Skin Integrity Issues: Skin Integrity Issues:: DTI,Stage I,Stage II,Unstageable,Other (Comment) DTI: upper back; R shoulder Stage I: bilateral heels; L elbow Stage II: R elbow Unstageable: full thickness to sacrum Other: full thickness maceration to rectum  Last BM:  5/18 -type 6  Height:   Ht Readings from Last 1  Encounters:  05/12/20 6\' 2"  (1.88 m)    Weight:   Wt Readings from Last 1 Encounters:  05/27/20 72.1 kg    BMI:  Body mass index is 20.41 kg/m.  Estimated Nutritional Needs:   Kcal:  2350-2550 kcal  Protein:  120-135 grams  Fluid:  >/= 2.5 L/day  Clayton Bibles, MS, RD, LDN Inpatient Clinical Dietitian Contact information available via Amion

## 2020-05-28 LAB — GLUCOSE, CAPILLARY
Glucose-Capillary: 52 mg/dL — ABNORMAL LOW (ref 70–99)
Glucose-Capillary: 88 mg/dL (ref 70–99)

## 2020-05-28 LAB — SARS CORONAVIRUS 2 (TAT 6-24 HRS): SARS Coronavirus 2: NEGATIVE

## 2020-05-28 MED ORDER — OXYCODONE HCL 5 MG PO TABS: 5.0000 mg | ORAL_TABLET | Freq: Four times a day (QID) | ORAL | 0 refills | Status: DC | PRN

## 2020-05-28 MED ORDER — OXYCODONE HCL 5 MG PO TABS: 5.0000 mg | ORAL_TABLET | Freq: Four times a day (QID) | ORAL | Status: DC | PRN

## 2020-05-28 MED ORDER — POLYETHYLENE GLYCOL 3350 17 G PO PACK: 17.0000 g | PACK | Freq: Every day | ORAL | 0 refills | Status: DC | PRN

## 2020-05-28 MED ORDER — SULFAMETHOXAZOLE-TRIMETHOPRIM 800-160 MG PO TABS: 1.0000 | ORAL_TABLET | Freq: Every day | ORAL | 0 refills | Status: AC

## 2020-05-28 MED ORDER — ENSURE ENLIVE PO LIQD: 237.0000 mL | Freq: Three times a day (TID) | ORAL | 0 refills | Status: AC

## 2020-05-28 MED ORDER — SODIUM CHLORIDE 0.9 % IV SOLN: 2.0000 g | Freq: Two times a day (BID) | INTRAVENOUS | 0 refills | Status: AC

## 2020-05-28 NOTE — Progress Notes (Signed)
Pt. Discharged via stretcher, [facility] ambulance for transport. Pt. Is alert and oriented, no distress. Report called to receiving facility, no one answers. pt. Is aware of the discharge disposition. All personal belongings are with the pt.

## 2020-05-28 NOTE — Progress Notes (Signed)
Report called to [facility] [phone]. Unable to give report per secretary RN is busy. Left my phone number to call back to give report.

## 2020-05-28 NOTE — TOC Transition Note (Signed)
Transition of Care [facility]) - CM/SW Discharge Note   Patient Details  Name: [name] [name] MRN: [id number] Date of Birth: Oct 28, 1960  [facility] CM/SW Contact:  Trish Mage, LCSW Phone Number: 05/28/2020, 11:05 AM   Clinical Narrative:   Patient who is stable for discharge will transfer to [facility] today.  Family alerted.  Facility confirmed reception of insurance authorization. PTAR arranged.  Nursing, please call report to [phone], room 119. TOC sign off.    Final next level of care: Skilled Nursing Facility Barriers to Discharge: Barriers Resolved   Patient Goals and CMS Choice Patient states their goals for this hospitalization and ongoing recovery are:: "I need help" CMS Medicare.gov Compare Post Acute Care list provided to:: Patient Choice offered to / list presented to : [place]  Discharge Placement                       Discharge Plan and Services   Discharge Planning Services: CM Consult Post Acute Care Choice: Skilled Nursing Facility                               Social Determinants of Health (SDOH) Interventions     Readmission Risk Interventions Readmission Risk Prevention Plan 05/28/2020  Transportation Screening Complete  PCP or Specialist Appt within 3-5 Days Complete  HRI or [place] Complete  Social Work Consult for [place] Planning/Counseling Complete  Palliative Care Screening Complete  Medication Review [employer]) Complete  Some recent data might be hidden

## 2020-05-28 NOTE — Consult Note (Signed)
WOC consulted for wounds around rectum however patient was DC to SNF prior to my ability to see patient.  [place], [place], [place]

## 2020-05-28 NOTE — Plan of Care (Signed)

## 2020-05-28 NOTE — Discharge Summary (Addendum)
Physician Discharge Summary  [name] [name] FYB:[id number] DOB: 09-11-1960 DOA: 05/12/2020  PCP: [name] Riches, MD  Admit date: 05/12/2020 Discharge date: 05/28/2020  Admitted From: Home  Disposition:  SNF   Recommendations for Outpatient Follow-up and new medication changes:  1. Follow up with Dr [name] in 7 to 10 days.   2. Plan to complete 6 weeks of antibiotic therapy with high dose IV ceftriaxone for Strep bacteremia, to complete on June 20th, 2022.   3. Follow up with Dr [name] from Orthopedics. 4. Remove PICC line after completion of antibiotic therapy on June 28, 2020.    Home Health: na   Equipment/Devices: na    Discharge Condition: stable  CODE STATUS: full  Diet recommendation: heart healthy   Diet recommendations: Dysphagia 3 (mechanical soft);Thin liquid Liquids provided via: Cup;Straw Medication Administration: Crushed with puree Supervision: Staff to assist with self feeding Compensations: Slow rate;Small sips/bites;Other (Comment) Postural Changes and/or Swallow Maneuvers: Seated upright 90 degrees;Upright 30-60 min after meal  Brief/Interim Summary: [name] was admitted to the hospital with working diagnosis of septic shock due to group B streptococcus bacteremia.  60 year old male past medical history for HIV, DVT left leg, chronic anemia, rectal cancer and arthritis who was found down at home in the bathroom for unknown period of time.  Last time patient was seen at his usual state of health was 2 days prior to hospitalization.  On his initial physical examination he was hypoxemic, he was placed on supplemental oxygen, while in the emergency department he became hypotensive and required vasopressors. His blood pressure 125/80, heart rate 103, temperature 98.9, respiratory rate 30, oxygen saturation 100% (on vasopressors and supplemental oxygen). He was awake and alert, his lungs were clear to auscultation, heart S1-S2, present, rhythmic, soft abdomen, no  lower extremity edema.  He had unstageable sacral pressure ulcer.  His pH was 7.30, PCO2 31.5 PO2 331, bicarb 10.4, sodium 149, potassium 4.7, chloride 117, BUN 70, creatinine 2.0, AST 101, ALT 66, CK 149, white count 15.5, hemoglobin 15.0, hematocrit 45.0, platelets 77.  Head CT with 2 mm posterior falcine subdural hematoma, no mass-effect no acute infarct.  Patient was placed on broad-spectrum antibiotic therapy.  Blood cultures positive for group B streptococcus.  Right hip MRI with large fluid collection surrounding right hip arthroplasty. Brain MRI with multifocal acute to subacute ischemic infarcts involving the bilateral cerebral and cerebellar hemispheres.  05/06 right hip aspiration per IR.  He required stress of steroids and packed red blood cell transfusion, eventually 5/11 he was weaned off vasopressors.  5/13 transferred to [facility].  Patient continue to be very weak and deconditioned, plan to complete 6 weeks of antibiotic therapy. PT/OT recommendations to transfer to SNF to continue therapy.   1.  Septic shock due to right hip septic arthritis complicated with streptococcal bacteremia and septic emboli to the brain.  Patient had prolonged hospitalization, his blood cultures were positive for group B Streptococcus. Further work-up with brain MRI revealed evidence of septic embolism, a potential source was a right hip which had a fluid collection per hip MRI.   On May 6 IR performed a successful right hip aspiration, purulent fluid was obtained, with 31,570 white cells, 61% neutrophils, the fluid was turbid, no crystals seen. Cultures were no growth  He responded well to antibiotic therapy, he was successfully weaned off vasopressors and transferred to the medical ward.  Patient very weak and deconditioned, plan to transfer to skilled nursing facility.  2.  Adrenal insufficiency.  Related to septic shock, patient received stress dose steroids with good toleration.  Currently  patient is off steroids.  3.  Subdural hematoma.  Conservative management, no need for further intervention.  4.  Chronic anemia of chronic disease, thrombocytopenia, multiple skin wounds, elbows, heels, shoulder and a decubitus unstageable ulcer, present on admission.   Patient required one PRBC transfusion on 5/11. Wound care was performed with good toleration, patient has poor ambulation and need further physical therapy.  5.  HIV, moderate calorie protein malnutrition, dysphagia.  Patient was resumed on his antiretroviral therapy.  Bactrim for pneumocystis prophylaxis. Speech evaluation recommended a dysphagia 3 diet with aspiration precautions.  6.  History of anal cancer.  Follow as an outpatient, recent biopsy negative.  7.  Steroid-induced hyperglycemia.  Hemoglobin A1c 5.5, steroids were discontinued, glucose improved.  8.  Acute kidney injury.  Patient received supportive medical therapy including vasopressors, IV fluids and PRBC transfusion.  At discharge creatinine is 1.10.  Discharge Diagnoses:  Principal Problem:   Septic shock ([place]) Active Problems:   Human immunodeficiency virus (HIV) disease ([place])   AKI (acute kidney injury) ([place])   Lactic acidosis   SDH (subdural hematoma) ([facility])   Pressure injury of skin   Streptococcal bacteremia   Malnutrition of moderate degree  No cellulitis.   Discharge Instructions   Allergies as of 05/28/2020      Reactions   Collagen Rash   redness      Medication List    STOP taking these medications   chlorproMAZINE 25 MG tablet Commonly known as: THORAZINE   traMADol 50 MG tablet Commonly known as: ULTRAM     TAKE these medications   cefTRIAXone 2 g in sodium chloride 0.9 % 100 mL Inject 2 g into the vein every 12 (twelve) hours for 17 days.   Edurant 25 MG Tabs tablet Generic drug: rilpivirine TAKE ONE TABLET (25 MG TOTAL) BY MOUTH DAILY WITH BREAKFAST What changed:   how much to take  how to take this  when  to take this  additional instructions   feeding supplement Liqd Take 237 mLs by mouth 3 (three) times daily between meals.   oxyCODONE 5 MG immediate release tablet Commonly known as: Oxy IR/ROXICODONE Take 1 tablet (5 mg total) by mouth every 6 (six) hours as needed for severe pain.   polyethylene glycol 17 g packet Commonly known as: MIRALAX / GLYCOLAX Take 17 g by mouth daily as needed for moderate constipation.   Prezcobix 800-150 MG tablet Generic drug: darunavir-cobicistat TAKE ONE TABLET BY MOUTH DAILY . SWALLOW WHOLE, DO NOT CRUSH, BREAK, OR CHEW TABLETS. TAKE WITH FOOD What changed:   how much to take  how to take this  when to take this  additional instructions   sulfamethoxazole-trimethoprim 800-160 MG tablet Commonly known as: BACTRIM DS Take 1 tablet by mouth daily. Start taking on: May 29, 2020   Tivicay 50 MG tablet Generic drug: dolutegravir TAKE ONE TABLET (50 MG TOTAL) BY MOUTH DAILY What changed: See the new instructions.   vitamin C 500 MG tablet Commonly known as: ASCORBIC ACID Take 1,000 mg by mouth daily.            Discharge Care Instructions  (From admission, onward)         Start     Ordered   05/28/20 0000  Discharge wound care:       Comments: Wound care daily, apply Santyl to sacrum wound daily then cover with moist gauze  and foam dressing.  Change foam dressing every 3 days or as needed soiling.  Foam dressing until discontinued.  Apply foam dressing to areas of deep tissue pressure injuries and change every 3 days or as needed soiling   05/28/20 1028          Contact information for after-discharge care    Destination    [facility] Preferred SNF .   Service: Skilled Nursing Contact information: [address] [place] [id number] [phone]                 Allergies  Allergen Reactions  . Collagen Rash    redness    Consultations:  ID  Orthopedics     Procedures/Studies: CT Abdomen Pelvis Wo Contrast  Result Date: 05/12/2020 CLINICAL DATA:  Sepsis, Unwitnessed fall, abdominal abscess EXAM: CT ABDOMEN AND PELVIS WITHOUT CONTRAST TECHNIQUE: Multidetector CT imaging of the abdomen and pelvis was performed following the standard protocol without IV contrast. COMPARISON:  07/31/2013 FINDINGS: Lower chest: Mild bibasilar atelectasis. The visualized heart and pericardium are unremarkable. Hepatobiliary: No focal liver abnormality is seen. No gallstones, gallbladder wall thickening, or biliary dilatation. Pancreas: Unremarkable Spleen: Unremarkable Adrenals/Urinary Tract: The adrenal glands are unremarkable. The kidneys are normal. The bladder is partially obscured by streak artifact from right total hip arthroplasty. Foley catheter balloon is seen within the bladder lumen. The visualized bladder is unremarkable. Stomach/Bowel: Stomach is within normal limits. Appendix appears normal. No evidence of bowel wall thickening, distention, or inflammatory changes. No free intraperitoneal gas or fluid. Vascular/Lymphatic: Mild atherosclerotic calcification within the abdominal aorta. No aortic aneurysm. No pathologic adenopathy within the abdomen and pelvis. Reproductive: The prostate gland is partially obscured by streak artifact but is otherwise unremarkable. Seminal vesicles are unremarkable. Other: There is a soft tissue wound in the region of the intergluteal cleft just subjacent to the coccyx. This appears slightly enlarged since prior examination. There is no associated discrete drainable fluid collection identified. 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 No acute bone abnormality. IMPRESSION: No acute intra-abdominal pathology identified. No definite radiographic explanation for the patient's reported symptoms. Enlarging superficial soft tissue wound within the intergluteal cleft immediately subjacent to the coccyx. No extension into  the perirectal or presacral space. No associated drainable fluid collection. Aortic Atherosclerosis (ICD10-I70.0). Electronically Signed   By: Helyn NumbersAshesh  Parikh MD   On: 05/12/2020 22:33   DG Abd 1 View  Result Date: 05/17/2020 CLINICAL DATA:  Feeding tube placement. EXAM: ABDOMEN - 1 VIEW COMPARISON:  05/14/2020 FINDINGS: The feeding tube tip is in the antropyloric region of the stomach. The lung bases are grossly clear. The abdominal bowel gas pattern is unremarkable. IMPRESSION: Feeding tube tip is in the antropyloric region of the stomach. Electronically Signed   By: Rudie MeyerP.  Gallerani M.D.   On: 05/17/2020 13:43   DG Abd 1 View  Result Date: 05/14/2020 CLINICAL DATA:  Feeding tube placement. EXAM: ABDOMEN - 1 VIEW COMPARISON:  None. FINDINGS: Feeding tube is in place with its tip in the fundus directed superiorly. IMPRESSION: As above. Electronically Signed   By: Drusilla Kannerhomas  Dalessio M.D.   On: 05/14/2020 14:35   CT Head Wo Contrast  Result Date: 05/13/2020 CLINICAL DATA:  Intracranial hemorrhage follow-up, 2 mm posterior fall seen subdural hematoma on comparison exam. EXAM: CT HEAD WITHOUT CONTRAST TECHNIQUE: Contiguous axial images were obtained from the base of the skull through the vertex without intravenous contrast. COMPARISON:  CT 05/12/2020 FINDINGS: Brain: Stable appearance of minimal  hyperdense thickening along the posterior falx compatible with a thin subdural hematoma measuring up to 2 mm in maximal thickness and not significantly changed from comparison exam. No new sites of hyperdense hemorrhage are seen. No evidence of acute infarction, hemorrhage, hydrocephalus, extra-axial collection, visible mass lesion or mass effect. Symmetric prominence of the ventricles, cisterns and sulci compatible with parenchymal volume loss. Patchy areas of white matter hypoattenuation are most compatible with chronic microvascular angiopathy. Vascular: No hyperdense vessel or unexpected calcification. Skull: Question  some mild high midline parietal scalp thickening. No subjacent calvarial fracture. No other significant scalp swelling. Sinuses/Orbits: Minimal mural thickening in the paranasal sinuses without layering air-fluid levels or pneumatized secretions. Mastoid air cells are clear. Other: Debris in the external auditory canals. Age-indeterminate deformity of the left nasal bone. IMPRESSION: 1. Stable appearance of some trace posterior para falcine subdural hematoma measure up to 2 mm in maximal thickness. No new sites of hemorrhage. 2. No other acute intracranial abnormality, specifically no new sites of hemorrhage or evidence of infarct. 3. Background of chronic microvascular angiopathy and parenchymal volume loss. 4. Question some mild high midline parietal scalp swelling, correlate for point tenderness. 5. Age-indeterminate deformity of the left nasal bone. 6. Debris in the bilateral external auditory canals, correlate for cerumen impaction. Electronically Signed   By: Lovena Le M.D.   On: 05/13/2020 01:23   CT Head Wo Contrast  Result Date: 05/12/2020 CLINICAL DATA:  Found down, sepsis EXAM: CT HEAD WITHOUT CONTRAST TECHNIQUE: Contiguous axial images were obtained from the base of the skull through the vertex without intravenous contrast. COMPARISON:  None. FINDINGS: Brain: There is a small subdural hematoma along the posterior falx, measuring up to 2 mm in thickness. No mass effect. No acute infarct. Lateral ventricles and remaining midline structures are unremarkable. Vascular: No hyperdense vessel or unexpected calcification. Skull: Normal. Negative for fracture or focal lesion. Sinuses/Orbits: No acute finding. Other: None. IMPRESSION: 1. 2 mm posterior falcine subdural hematoma.  No mass effect. 2. No acute infarct. Critical Value/emergent results were called by telephone at the time of interpretation on 05/12/2020 at 10:34 pm to provider DR [facility], who verbally acknowledged these results. Electronically  Signed   By: Randa Ngo M.D.   On: 05/12/2020 22:34   CT Cervical Spine Wo Contrast  Result Date: 05/12/2020 CLINICAL DATA:  Found down EXAM: CT CERVICAL SPINE WITHOUT CONTRAST TECHNIQUE: Multidetector CT imaging of the cervical spine was performed without intravenous contrast. Multiplanar CT image reconstructions were also generated. COMPARISON:  None. FINDINGS: Alignment: Alignment is grossly anatomic. Skull base and vertebrae: No acute fracture. No primary bone lesion or focal pathologic process. Soft tissues and spinal canal: No prevertebral fluid or swelling. No visible canal hematoma. Disc levels: Prominent spondylosis at C5-6 and C6-7. Mild diffuse facet hypertrophy. Upper chest: Airway is patent. Emphysematous changes are noted at the lung apices. Other: Reconstructed images demonstrate no additional findings. IMPRESSION: 1. No acute cervical spine fracture. Multilevel cervical degenerative change. Electronically Signed   By: Randa Ngo M.D.   On: 05/12/2020 22:33   MR BRAIN W WO CONTRAST  Result Date: 05/14/2020 CLINICAL DATA:  Initial evaluation for acute mental status change, unknown cause. EXAM: MRI HEAD WITHOUT AND WITH CONTRAST TECHNIQUE: Multiplanar, multiecho pulse sequences of the brain and surrounding structures were obtained without and with intravenous contrast. CONTRAST:  42mL GADAVIST GADOBUTROL 1 MMOL/ML IV SOLN COMPARISON:  Prior head CT from earlier the same day. FINDINGS: Brain: Examination mildly degraded by motion artifact. Diffuse  prominence of the CSF containing spaces compatible generalized age-related cerebral atrophy. Patchy and confluent T2/FLAIR hyperintensity within the periventricular deep white matter both cerebral hemispheres most consistent with chronic small vessel ischemic disease, mild in nature. Fairly extensive multifocal foci of diffusion abnormality are seen involving the cortical and subcortical aspect of both cerebral hemispheres. Additional patchy  involvement noted within the cerebellar hemispheres bilaterally as well. Patchy involvement of the right basal ganglia and left thalamus. Findings consistent with multifocal acute to subacute ischemic infarcts. For reference purposes, the largest focus within the left cerebral hemisphere is seen involving the white matter of the left frontal centrum semi ovale and measures 1 cm (series 5, image 85). Largest area involvement at the right cerebral hemisphere involves the cortical and subcortical aspect of the anterior right frontal lobe r, measuring approximately up to 2.9 cm (series 5, image 85). Associated T2/FLAIR signal abnormality with patchy and serpiginous postcontrast enhancement seen about several of these foci, consistent with subacute ischemic changes. Additionally, probable associated petechial hemorrhage about several foci without frank hemorrhagic transformation. No significant regional mass effect about these areas of ischemia. A central thromboembolic etiology is likely given the various vascular distributions involved. Multiple additional scattered chronic micro hemorrhages seen involving the bilateral cerebral and cerebellar hemispheres, nonspecific, and could be related to cerebral amyloid angiopathy and/or hypertension. Small subdural hemorrhage seen along the left posterior falx again noted, measuring up to 3 mm in maximal thickness. Slight extension along the left tentorium. No associated mass effect. No other acute intracranial hemorrhage or extra-axial collection. No mass lesion or midline shift. Ventricles normal size without hydrocephalus. No other abnormal enhancement. Vascular: Major intracranial vascular flow voids are maintained. Skull and upper cervical spine: Craniocervical junction within normal limits. Bone marrow signal intensity normal. No focal marrow replacing lesion. Left parieto-occipital scalp contusion noted. Sinuses/Orbits: Globes and orbital soft tissues demonstrate no acute  finding. Mild scattered mucosal thickening noted throughout the paranasal sinuses. Trace bilateral mastoid effusions, of doubtful significance. Inner ear structures grossly normal. Other: None. IMPRESSION: 1. Patchy multifocal acute to subacute ischemic infarcts involving the bilateral cerebral and cerebellar hemispheres as above. Associated mild scattered petechial hemorrhage without frank hemorrhagic transformation or significant mass effect. A central thromboembolic etiology is likely given the various vascular distributions involved. 2. Small 3 mm subdural hemorrhage along the left posterior falx without associated mass effect. 3. Multiple additional scattered chronic micro hemorrhages involving the bilateral cerebral and cerebellar hemispheres, nonspecific, but could be related to cerebral amyloid angiopathy and/or hypertension. 4. Underlying mild age-related cerebral atrophy with chronic small vessel ischemic disease. Electronically Signed   By: Jeannine Boga M.D.   On: 05/14/2020 00:50   MR HIP RIGHT W WO CONTRAST  Result Date: 05/13/2020 CLINICAL DATA:  Right hip arthroplasty, possible infection EXAM: MRI OF THE RIGHT HIP WITHOUT AND WITH CONTRAST TECHNIQUE: Multiplanar, multisequence MR imaging was performed both before and after administration of intravenous contrast. CONTRAST:  48mL GADAVIST GADOBUTROL 1 MMOL/ML IV SOLN COMPARISON:  05/12/2020 FINDINGS: There is a right hip arthroplasty in place. A large fluid collection surrounds the right hip prosthesis and proximal femur, measuring approximately 9.7 x 5.4 by 7.7 cm in size. The fluid collection is approximately 1.2 cm deep to the skin surface along its lateral aspect. The fluid collection demonstrates mild peripheral enhancement on postcontrast imaging. Fluid extends along the femoral component of the right hip arthroplasty within the medullary cavity of the proximal right femur. Infection cannot be excluded. If further evaluation is desired,  aspiration and culture may be indicated. Remaining bony structures demonstrate normal signal characteristics. Mild dependent edema is seen within the proximal thighs. There is a small soft tissue defect along the right aspect of the gluteal cleft. This extends toward the coccyx, without associated fluid collection or abscess. No involvement of the distal rectum or anal verge. There is a small amount of free fluid within the pelvis. Visualized portions of the bowel are unremarkable. Bladder is moderately distended without filling defect. Small amount of gas is seen within the bladder lumen, likely related to recent catheter placement. Bilateral hydroceles are identified, left larger than right. IMPRESSION: 1. Large fluid collection surrounding the right hip arthroplasty, with minimal peripheral enhancement on postcontrast imaging. The fluid extends along the femoral component within the medullary cavity of the proximal right femur. Underlying infection is not excluded. Aspiration and culture may be useful for confirmation. 2. Small soft tissue defect along the right aspect of the gluteal cleft, with no underlying fluid collection or abscess. No change since recent CT. 3. Trace free fluid within the pelvis. 4. Bilateral hydroceles. Electronically Signed   By: Randa Ngo M.D.   On: 05/13/2020 22:55   DG Pelvis Portable  Result Date: 05/19/2020 CLINICAL DATA:  Chronic pain EXAM: PORTABLE PELVIS 1-2 VIEWS COMPARISON:  March 28 27 pelvis radiograph; MR right hip May 13, 2020 FINDINGS: There is a total hip replacement on the right with prosthetic components well-seated on frontal view. No acute fracture or dislocation. Moderate narrowing left hip joint. There is chronic bony overgrowth along the superolateral right acetabulum. Osteoporosis. IMPRESSION: Osteoporosis. Total hip replacement on the right with prosthetic components appearing well-seated on frontal view. Chronic bony overgrowth along the superolateral  right acetabulum. No acute fracture or dislocation. Narrowing left hip joint, stable. Electronically Signed   By: Lowella Grip III M.D.   On: 05/19/2020 09:52   DG Chest Port 1 View  Result Date: 05/17/2020 CLINICAL DATA:  60 year old male respiratory failure. Found down, sepsis, acute cerebral infarcts, trace subdural hematoma. EXAM: PORTABLE CHEST 1 VIEW COMPARISON:  Portable chest 05/12/2020 and earlier. FINDINGS: Portable AP semi upright view at 0418 hours. Enteric feeding tube in place and courses to the abdomen, tip not included. Lower lung volumes. Mild crowding of lung markings but no other abnormal pulmonary opacity. No pneumothorax identified. Mediastinal contours are stable and within normal limits. Visualized tracheal air column is within normal limits. No acute osseous abnormality identified. IMPRESSION: Enteric feeding tube in place. Lower lung volumes with no acute cardiopulmonary abnormality. Electronically Signed   By: Genevie Ann M.D.   On: 05/17/2020 04:58   DG Chest Port 1 View  Result Date: 05/12/2020 CLINICAL DATA:  Found on floor EXAM: PORTABLE CHEST 1 VIEW COMPARISON:  04/14/2015 FINDINGS: Apical blebs. No focal opacity or pleural effusion. Normal cardiomediastinal silhouette. No pneumothorax IMPRESSION: No active disease. Electronically Signed   By: Donavan Foil M.D.   On: 05/12/2020 19:41   DG FLUORO GUIDED NEEDLE PLC ASPIRATION/INJECTION LOC  Result Date: 05/14/2020 CLINICAL DATA:  Right hip periprosthetic fluid collection. FLUOROSCOPY TIME:  Radiation Exposure Index (as provided by the fluoroscopic device): 1.1 mGy Fluoroscopy Time:  12 seconds Number of Acquired Images:  0 PROCEDURE: The risks and benefits of the procedure were discussed with the patient's sister, and written informed consent was obtained. A formal timeout procedure was performed with the patient according to departmental protocol. The patient was placed supine on the fluoroscopy table and the right hip joint  was  identified under fluoroscopy. The skin overlying the right hip joint was subsequently cleaned with Betadine and a sterile drape was placed over the area of interest. 2 ml 1% Lidocaine was used to anesthetize the skin around the needle insertion site. A 20 gauge spinal needle was inserted into the right hip joint under fluoroscopy. 20 mL of cloudy, yellow fluid was aspirated and sent to the laboratory for analysis. The needle was removed and hemostasis was achieved. There were no immediate complications. IMPRESSION: Technically successful right hip aspiration. Purulent fluid aspirated. Electronically Signed   By: Titus Dubin M.D.   On: 05/14/2020 17:47   ECHOCARDIOGRAM COMPLETE  Result Date: 05/13/2020    ECHOCARDIOGRAM REPORT   Patient Name:   BUFFORD SUTHERS Date of Exam: 05/13/2020 Medical Rec #:  [id number]      Height:       74.0 in Accession #:    [id number]     Weight:       141.1 lb Date of Birth:  Oct 21, 1960     BSA:          1.874 m Patient Age:    60 years       BP:           73/53 mmHg Patient Gender: M              HR:           88 bpm. Exam Location:  Inpatient Procedure: 2D Echo, Cardiac Doppler and Color Doppler Indications:    CHF  History:        Patient has prior history of Echocardiogram examinations, most                 recent 08/02/2013.  Sonographer:    Bernadene Person RDCS Referring Phys: [id number] [name] Pain  Sonographer Comments: Image acquisition challenging due to uncooperative patient. IMPRESSIONS  1. Left ventricular ejection fraction, by estimation, is 55 to 60%. The left ventricle has normal function. The left ventricle has no regional wall motion abnormalities. Left ventricular diastolic parameters are consistent with Grade I diastolic dysfunction (impaired relaxation).  2. Right ventricular systolic function is normal. The right ventricular size is normal. There is normal pulmonary artery systolic pressure.  3. The mitral valve is grossly normal. No evidence of mitral valve  regurgitation.  4. The aortic valve was not well visualized. Aortic valve regurgitation is not visualized. No aortic stenosis is present.  5. Aortic dilatation noted. There is mild dilatation of the aortic root, measuring 40 mm. Comparison(s): A prior study was performed on 08/02/2013. Prior images reviewed side by side. 2015 study superior quality, compared to this aortic root size is increased. FINDINGS  Left Ventricle: Left ventricular ejection fraction, by estimation, is 55 to 60%. The left ventricle has normal function. The left ventricle has no regional wall motion abnormalities. The left ventricular internal cavity size was normal in size. There is  no left ventricular hypertrophy. Left ventricular diastolic parameters are consistent with Grade I diastolic dysfunction (impaired relaxation). Right Ventricle: The right ventricular size is normal. No increase in right ventricular wall thickness. Right ventricular systolic function is normal. There is normal pulmonary artery systolic pressure. The tricuspid regurgitant velocity is 2.11 m/s, and  with an assumed right atrial pressure of 3 mmHg, the estimated right ventricular systolic pressure is [id number] mmHg. Left Atrium: Left atrial size was normal in size. Right Atrium: Right atrial size was normal in size. Pericardium: There is no evidence of pericardial effusion.  Mitral Valve: The mitral valve is grossly normal. No evidence of mitral valve regurgitation. Tricuspid Valve: The tricuspid valve is grossly normal. Tricuspid valve regurgitation is not demonstrated. Aortic Valve: The aortic valve was not well visualized. Aortic valve regurgitation is not visualized. No aortic stenosis is present. Pulmonic Valve: The pulmonic valve was not well visualized. Pulmonic valve regurgitation is not visualized. No evidence of pulmonic stenosis. Aorta: Aortic dilatation noted. There is mild dilatation of the aortic root, measuring 40 mm. IAS/Shunts: The atrial septum is grossly  normal.  LEFT VENTRICLE PLAX 2D LVIDd:         3.80 cm  Diastology LVIDs:         3.20 cm  LV e' medial:    5.01 cm/s LV PW:         0.70 cm  LV E/e' medial:  11.7 LV IVS:        0.90 cm  LV e' lateral:   6.12 cm/s LVOT diam:     2.10 cm  LV E/e' lateral: 9.6 LV SV:         45 LV SV Index:   24 LVOT Area:     3.46 cm  RIGHT VENTRICLE RV S prime:     11.70 cm/s TAPSE (M-mode): 1.6 cm LEFT ATRIUM             Index       RIGHT ATRIUM          Index LA diam:        1.90 cm 1.01 cm/m  RA Area:     9.71 cm LA Vol (A2C):   24.9 ml 13.29 ml/m RA Volume:   20.80 ml 11.10 ml/m LA Vol (A4C):   20.2 ml 10.78 ml/m LA Biplane Vol: 22.5 ml 12.01 ml/m  AORTIC VALVE LVOT Vmax:   76.50 cm/s LVOT Vmean:  48.300 cm/s LVOT VTI:    0.131 m  AORTA Ao Root diam: 4.00 cm MITRAL VALVE               TRICUSPID VALVE MV Area (PHT): 3.30 cm    TR Peak grad:   17.8 mmHg MV Decel Time: 230 msec    TR Vmax:        211.00 cm/s MV E velocity: 58.70 cm/s MV A velocity: 62.10 cm/s  SHUNTS MV E/A ratio:  0.95        Systemic VTI:  0.13 m                            Systemic Diam: 2.10 cm Rudean Haskell MD Electronically signed by Rudean Haskell MD Signature Date/Time: 05/13/2020/5:16:09 PM    Final    [place] EKG SITE RITE  Result Date: 05/24/2020 If Site Rite image not attached, placement could not be confirmed due to current cardiac rhythm.     Procedures: right hip aspiration   Subjective: Patient is feeling better, no nausea or vomiting, no chest pain or dyspnea, continue to be very weak and deconditioned, not yet back to his baseline.   Discharge Exam: Vitals:   05/27/20 2020 05/28/20 0516  BP: 114/61 132/67  Pulse: 76 69  Resp: 18 16  Temp: 98.5 F (36.9 C) 98.7 F (37.1 C)  SpO2: 97% 100%   Vitals:   05/27/20 0508 05/27/20 1422 05/27/20 2020 05/28/20 0516  BP: (!) 155/70 (!) 116/59 114/61 132/67  Pulse: 73 69 76 69  Resp: 19 20 18  16  Temp: 98.5 F (36.9 C) 98.2 F (36.8 C) 98.5 F (36.9 C) 98.7 F  (37.1 C)  TempSrc: Oral Oral Oral Oral  SpO2: 96% 96% 97% 100%  Weight: 72.1 kg   71.4 kg  Height:        General: Not in pain or dyspnea.  Neurology: Awake and alert, non focal  E ENT: no pallor, no icterus, oral mucosa moist Cardiovascular: No JVD. S1-S2 present, rhythmic, no gallops, rubs, or murmurs. No lower extremity edema. Pulmonary: vesicular breath sounds bilaterally, adequate air movement, no wheezing, rhonchi or rales. Gastrointestinal. Abdomen soft and non tender Skin. Multiple skin lesions upper and lower extremities. Large sacral decub not able to stage.  Musculoskeletal: no joint deformities   The results of significant diagnostics from this hospitalization (including imaging, microbiology, ancillary and laboratory) are listed below for reference.     Microbiology: Recent Results (from the past 240 hour(s))  Culture, blood (Routine X 2) w Reflex to ID Panel     Status: None   Collection Time: 05/18/20 10:36 AM   Specimen: BLOOD LEFT HAND  Result Value Ref Range Status   Specimen Description   Final    BLOOD LEFT HAND Performed at [facility] [address], [place], [place] [address]    Special Requests   Final    BOTTLES DRAWN AEROBIC ONLY Blood Culture adequate volume Performed at [facility] [address] [place], [place] [address]    Culture   Final    NO GROWTH 5 DAYS Performed at [facility], [place] [address] [place], [place] [address]    Report Status 05/23/2020 FINAL  Final  Culture, blood (Routine X 2) w Reflex to ID Panel     Status: None   Collection Time: 05/18/20 10:36 AM   Specimen: BLOOD LEFT HAND  Result Value Ref Range Status   Specimen Description   Final    BLOOD LEFT HAND Performed at [facility] [address], [place], [place] [address]    Special Requests   Final    BOTTLES DRAWN AEROBIC ONLY Blood Culture results may not be optimal due to an inadequate volume  of blood received in culture bottles Performed at [facility] [address], [place], [place] [address]    Culture   Final    NO GROWTH 5 DAYS Performed at [facility], [place] [address] [place], [place] [address]    Report Status 05/23/2020 FINAL  Final  SARS CORONAVIRUS 2 (TAT 6-24 HRS) Nasopharyngeal Nasopharyngeal Swab     Status: None   Collection Time: 05/27/20  2:00 PM   Specimen: Nasopharyngeal Swab  Result Value Ref Range Status   SARS Coronavirus 2 NEGATIVE NEGATIVE Final    Comment: (NOTE) SARS-CoV-2 target nucleic acids are NOT DETECTED.  The SARS-CoV-2 RNA is generally detectable in upper and lower respiratory specimens during the acute phase of infection. Negative results do not preclude SARS-CoV-2 infection, do not rule out co-infections with other pathogens, and should not be used as the sole basis for treatment or other patient management decisions. Negative results must be combined with clinical observations, patient history, and epidemiological information. The expected result is Negative.  Fact Sheet for Patients: SugarRoll.be  Fact Sheet for Healthcare Providers: https://www.woods-mathews.com/  This test is not yet approved or cleared by the [place] FDA and  has been authorized for detection and/or diagnosis of SARS-CoV-2 by FDA under an Emergency Use Authorization (  EUA). This EUA will remain  in effect (meaning this test can be used) for the duration of the COVID-19 declaration under Se ction 564(b)(1) of the Act, 21 U.S.C. section 360bbb-3(b)(1), unless the authorization is terminated or revoked sooner.  Performed at [facility], [place] [address], [place], [place] [address]      Labs: BNP (last 3 results) Recent Labs    05/13/20 0120  BNP 4,403.4*   Basic Metabolic Panel: Recent Labs  Lab 05/22/20 0559 05/23/20 0503 05/24/20 0527 05/25/20 0500 05/27/20 0409   NA 144 144 145 143 139  K 4.1 4.1 4.0 3.8 3.4*  CL 117* 119* 114* 116* 114*  CO2 23 22 22 23 24   GLUCOSE 89 100* 102* 129* 79  BUN 31* 30* 27* 22* 15  CREATININE 1.36* 1.16 1.02 1.15 1.10  CALCIUM 7.9* 7.7* 7.8* 7.6* 7.4*  MG 2.1 2.0  --  1.9 1.6*  PHOS 4.1 3.4  --  2.7 1.9*   Liver Function Tests: No results for input(s): AST, ALT, ALKPHOS, BILITOT, PROT, ALBUMIN in the last 168 hours. No results for input(s): LIPASE, AMYLASE in the last 168 hours. No results for input(s): AMMONIA in the last 168 hours. CBC: Recent Labs  Lab 05/22/20 0559 05/23/20 0503 05/24/20 0527 05/25/20 0500 05/27/20 0409  WBC 7.4 9.9  --   --  6.4  HGB 8.1* 8.2* 8.0* 8.5* 7.4*  HCT 26.1* 25.8* 26.2* 28.0* 24.1*  MCV 113.5* 110.3*  --   --  111.6*  PLT 161 188  --   --  145*   Cardiac Enzymes: No results for input(s): CKTOTAL, CKMB, CKMBINDEX, TROPONINI in the last 168 hours. BNP: Invalid input(s): POCBNP CBG: Recent Labs  Lab 05/27/20 0739 05/27/20 1118 05/27/20 1649 05/28/20 0806 05/28/20 0840  GLUCAP 83 89 85 52* 88   D-Dimer No results for input(s): DDIMER in the last 72 hours. Hgb A1c No results for input(s): HGBA1C in the last 72 hours. Lipid Profile No results for input(s): CHOL, HDL, LDLCALC, TRIG, CHOLHDL, LDLDIRECT in the last 72 hours. Thyroid function studies No results for input(s): TSH, T4TOTAL, T3FREE, THYROIDAB in the last 72 hours.  Invalid input(s): FREET3 Anemia work up No results for input(s): VITAMINB12, FOLATE, FERRITIN, TIBC, IRON, RETICCTPCT in the last 72 hours. Urinalysis    Component Value Date/Time   COLORURINE YELLOW 05/12/2020 2045   APPEARANCEUR CLEAR 05/12/2020 2045   LABSPEC 1.015 05/12/2020 2045   LABSPEC 1.030 04/11/2007 1446   PHURINE 6.0 05/12/2020 2045   GLUCOSEU NEGATIVE 05/12/2020 2045   HGBUR LARGE (A) 05/12/2020 2045   BILIRUBINUR NEGATIVE 05/12/2020 2045   BILIRUBINUR Negative 04/11/2007 1446   KETONESUR 20 (A) 05/12/2020 2045    PROTEINUR 30 (A) 05/12/2020 2045   UROBILINOGEN 0.2 06/10/2014 1303   NITRITE NEGATIVE 05/12/2020 2045   LEUKOCYTESUR NEGATIVE 05/12/2020 2045   LEUKOCYTESUR Negative 04/11/2007 1446   Sepsis Labs Invalid input(s): PROCALCITONIN,  WBC,  LACTICIDVEN Microbiology Recent Results (from the past 240 hour(s))  Culture, blood (Routine X 2) w Reflex to ID Panel     Status: None   Collection Time: 05/18/20 10:36 AM   Specimen: BLOOD LEFT HAND  Result Value Ref Range Status   Specimen Description   Final    BLOOD LEFT HAND Performed at [facility], [place] [address], [place], [place] [address]    Special Requests   Final    BOTTLES DRAWN AEROBIC ONLY Blood Culture adequate volume Performed at [facility], [place] [address], [place], [place] [address]    Culture   Final    NO GROWTH 5 DAYS Performed at [facility], [place] [address], [place], [place] [address]    Report Status 05/23/2020 FINAL  Final  Culture, blood (Routine X 2) w Reflex to ID Panel     Status: None   Collection Time: 05/18/20 10:36 AM   Specimen: BLOOD LEFT HAND  Result Value Ref Range Status   Specimen Description   Final    BLOOD LEFT HAND Performed at [facility] [address] [place], [place] [address]    Special Requests   Final    BOTTLES DRAWN AEROBIC ONLY Blood Culture results may not be optimal due to an inadequate volume of blood received in culture bottles Performed at [facility] [address] [place], [place] [address]    Culture   Final    NO GROWTH 5 DAYS Performed at [facility], [place] [address] [place], [place] [address]    Report Status 05/23/2020 FINAL  Final  SARS CORONAVIRUS 2 (TAT 6-24 HRS) Nasopharyngeal Nasopharyngeal Swab     Status: None   Collection Time: 05/27/20  2:00 PM   Specimen: Nasopharyngeal Swab  Result Value Ref Range Status   SARS Coronavirus 2 NEGATIVE NEGATIVE Final    Comment:  (NOTE) SARS-CoV-2 target nucleic acids are NOT DETECTED.  The SARS-CoV-2 RNA is generally detectable in upper and lower respiratory specimens during the acute phase of infection. Negative results do not preclude SARS-CoV-2 infection, do not rule out co-infections with other pathogens, and should not be used as the sole basis for treatment or other patient management decisions. Negative results must be combined with clinical observations, patient history, and epidemiological information. The expected result is Negative.  Fact Sheet for Patients: SugarRoll.be  Fact Sheet for Healthcare Providers: https://www.woods-mathews.com/  This test is not yet approved or cleared by the [place] FDA and  has been authorized for detection and/or diagnosis of SARS-CoV-2 by FDA under an Emergency Use Authorization (EUA). This EUA will remain  in effect (meaning this test can be used) for the duration of the COVID-19 declaration under Se ction 564(b)(1) of the Act, 21 U.S.C. section 360bbb-3(b)(1), unless the authorization is terminated or revoked sooner.  Performed at [facility], [place] [address] [place], [place] [address]      Time coordinating discharge: 45 minutes  SIGNED:   Tawni Millers, MD  [facility] 05/28/2020, 10:00 AM

## 2020-06-01 ENCOUNTER — Encounter: Payer: Medicare Other | Admitting: Gastroenterology

## 2020-06-17 ENCOUNTER — Ambulatory Visit (INDEPENDENT_AMBULATORY_CARE_PROVIDER_SITE_OTHER): Payer: Medicare Other | Admitting: Infectious Diseases

## 2020-06-17 ENCOUNTER — Other Ambulatory Visit: Payer: Self-pay

## 2020-06-17 VITALS — BP 106/68 | HR 87

## 2020-06-17 DIAGNOSIS — B20 Human immunodeficiency virus [HIV] disease: Secondary | ICD-10-CM | POA: Diagnosis not present

## 2020-06-17 DIAGNOSIS — Z113 Encounter for screening for infections with a predominantly sexual mode of transmission: Secondary | ICD-10-CM

## 2020-06-17 DIAGNOSIS — M00051 Staphylococcal arthritis, right hip: Secondary | ICD-10-CM

## 2020-06-17 DIAGNOSIS — L89310 Pressure ulcer of right buttock, unstageable: Secondary | ICD-10-CM

## 2020-06-17 DIAGNOSIS — N35919 Unspecified urethral stricture, male, unspecified site: Secondary | ICD-10-CM

## 2020-06-17 DIAGNOSIS — K089 Disorder of teeth and supporting structures, unspecified: Secondary | ICD-10-CM

## 2020-06-17 MED ORDER — LEVOFLOXACIN 25 MG/ML PO SOLN: 500.0000 mg | Freq: Every day | ORAL | 2 refills | Status: DC

## 2020-06-17 NOTE — Assessment & Plan Note (Signed)
Will refer to uro as d/c from hospital with foley

## 2020-06-17 NOTE — Addendum Note (Signed)
Addended by: Eugenia Mcalpine on: 06/17/2020 05:15 PM   Modules accepted: Orders

## 2020-06-17 NOTE — Assessment & Plan Note (Signed)
Will refer to [facility]

## 2020-06-17 NOTE — Assessment & Plan Note (Signed)
Encouraged oral hygiene He defers dental eval.

## 2020-06-17 NOTE — Assessment & Plan Note (Addendum)
He will continue on levaquin after he completes his ceftriaxone.  Will check his ESR and CRP.  Will get him in with Dr [name].  Will see him back in 6 weeks.

## 2020-06-17 NOTE — Progress Notes (Signed)
   Subjective:    Patient ID: [name] [name], male  DOB: [birth year], 60 y.o.        MRN: [id number]   HPI 60 yo M with hx of HIV/AIDS, rectal CA, adm to [facility] on 5-4 after being found down. He was found to have group B streptococcal bacteremia. 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 He initially required pressors.  He had TEE (-).  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 He had aspiration of his hip while in hospital which did not grow bacteria but did show 31,570 WBC.  He was treated with Ceftriaxone IVPB to end on 06-28-20.  His ART was restarted in hospital (Prezcobix/edurant/tivicay qday). He was also restarted on bactrim.  He has been taking his medicines well.  Here with sister.  Has been feeling fatigued.  Still has PIC.  He still has a foley. He is unsure of his plan for this.  Still at rehab center. Plan for home is not clear. He is not able to stand or walk yet. Having difficulty feeding self- having tremors.  Memory still not normal.   HIV 1 RNA Quant  Date Value  05/13/2020 866,000 copies/mL  11/25/2019 1,020 copies/mL (H)  10/15/2019 385 Copies/mL (H)   CD4 T Cell Abs (/uL)  Date Value  05/13/2020 <35 (L)  11/25/2019 207 (L)  10/15/2019 275 (L)     Health Maintenance  Topic Date Due  . Pneumococcal Vaccine 60-67 Years old (1 - PCV) Never done  . TETANUS/TDAP  Never done  . Zoster Vaccines- Shingrix (1 of 2) Never done  . COLONOSCOPY (Pts 45-3yrs Insurance coverage will need to be confirmed)  Never done  . COVID-19 Vaccine (4 - Booster for Pfizer series) 02/28/2020  . INFLUENZA VACCINE  08/09/2020  . Hepatitis C Screening  Completed  . HIV Screening  Completed  . HPV VACCINES  Aged Out    Review of Systems  Constitutional:  Negative for chills and fever.  Gastrointestinal:  Negative for constipation and diarrhea.  Genitourinary:  Negative for dysuria.  Neurological:  Negative for loss of consciousness.   Please see HPI. All  other systems reviewed and negative.     Objective:  Physical Exam Vitals reviewed.  Constitutional:      General: He is not in acute distress.    Appearance: He is ill-appearing.  HENT:     Mouth/Throat:     Mouth: Mucous membranes are moist.     Pharynx: No oropharyngeal exudate.  Eyes:     Extraocular Movements: Extraocular movements intact.     Pupils: Pupils are equal, round, and reactive to light.  Cardiovascular:     Rate and Rhythm: Normal rate and regular rhythm.  Abdominal:     General: Abdomen is flat. Bowel sounds are normal.     Palpations: Abdomen is soft.  Musculoskeletal:     Right lower leg: No edema.     Left lower leg: No edema.  Neurological:     Mental Status: He is alert.     Comments: Bilateral foot drop.  Very limited movement.   Psychiatric:        Cognition and Memory: Cognition is impaired. Memory is impaired. He exhibits impaired recent memory.     Comments: Immediate recall 3/3           Assessment & Plan:

## 2020-06-17 NOTE — Assessment & Plan Note (Signed)
He will continue on bactrim, his current salvage ART.  Discussed with his family member.  Will check his labs today.  Will see him back in 1 month.

## 2020-06-18 ENCOUNTER — Telehealth: Payer: Self-pay

## 2020-06-18 NOTE — Telephone Encounter (Signed)
-----   Message from [name], MD sent at 06/18/2020 11:34 AM EDT ----- Please have [name] [name] get a repeat CBC this weekend Thanks, his h/h are down

## 2020-06-18 NOTE — Telephone Encounter (Signed)
Orders for repeat CBC faxed to patient's facility; [facility].  Phone: [phone] Fax: [phone]

## 2020-06-28 ENCOUNTER — Telehealth: Payer: Self-pay

## 2020-06-28 NOTE — Telephone Encounter (Signed)
Patient called states pharmacy only sent him Prezcobix, he did not receive his rilpivirine or Tivicay. Advised patient to call [facility] first and then call our office if he is still having difficulty obtaining his medication. Patient verbalized understanding and has no further questions.   Beryle Flock, RN

## 2020-07-15 ENCOUNTER — Encounter (HOSPITAL_BASED_OUTPATIENT_CLINIC_OR_DEPARTMENT_OTHER): Payer: Medicare Other | Admitting: Internal Medicine

## 2020-07-16 ENCOUNTER — Telehealth: Payer: Self-pay

## 2020-07-16 NOTE — Telephone Encounter (Signed)
CMA attempted to call patient after receiving fax from pharmacy stating they have not been able to reach patient to setup delivery for next refill. Not able to reach patient at this time. Left voicemail requesting he call pharmacy to set up refill and to call office with any questions/concerns. Leatrice Jewels, RMA

## 2020-07-20 ENCOUNTER — Ambulatory Visit: Payer: Medicare Other

## 2020-07-20 ENCOUNTER — Other Ambulatory Visit: Payer: Self-pay

## 2020-07-20 ENCOUNTER — Encounter: Payer: Self-pay | Admitting: Infectious Diseases

## 2020-07-20 ENCOUNTER — Ambulatory Visit (INDEPENDENT_AMBULATORY_CARE_PROVIDER_SITE_OTHER): Payer: Medicare Other | Admitting: Infectious Diseases

## 2020-07-20 DIAGNOSIS — L89152 Pressure ulcer of sacral region, stage 2: Secondary | ICD-10-CM

## 2020-07-20 DIAGNOSIS — K089 Disorder of teeth and supporting structures, unspecified: Secondary | ICD-10-CM | POA: Diagnosis not present

## 2020-07-20 DIAGNOSIS — M00051 Staphylococcal arthritis, right hip: Secondary | ICD-10-CM

## 2020-07-20 DIAGNOSIS — B20 Human immunodeficiency virus [HIV] disease: Secondary | ICD-10-CM

## 2020-07-20 LAB — COMPREHENSIVE METABOLIC PANEL
AG Ratio: 0.6 (calc) — ABNORMAL LOW (ref 1.0–2.5)
ALT: 12 U/L (ref 9–46)
AST: 19 U/L (ref 10–35)
Albumin: 2.5 g/dL — ABNORMAL LOW (ref 3.6–5.1)
Alkaline phosphatase (APISO): 71 U/L (ref 35–144)
BUN: 21 mg/dL (ref 7–25)
CO2: 24 mmol/L (ref 20–32)
Calcium: 8.3 mg/dL — ABNORMAL LOW (ref 8.6–10.3)
Chloride: 104 mmol/L (ref 98–110)
Creat: 1.04 mg/dL (ref 0.70–1.33)
Globulin: 4 g/dL (calc) — ABNORMAL HIGH (ref 1.9–3.7)
Glucose, Bld: 97 mg/dL (ref 65–99)
Potassium: 4.2 mmol/L (ref 3.5–5.3)
Sodium: 134 mmol/L — ABNORMAL LOW (ref 135–146)
Total Bilirubin: 0.4 mg/dL (ref 0.2–1.2)
Total Protein: 6.5 g/dL (ref 6.1–8.1)

## 2020-07-20 LAB — RPR: RPR Ser Ql: NONREACTIVE

## 2020-07-20 LAB — CBC
HCT: 23.1 % — ABNORMAL LOW (ref 38.5–50.0)
Hemoglobin: 7.4 g/dL — ABNORMAL LOW (ref 13.2–17.1)
MCH: 31.9 pg (ref 27.0–33.0)
MCHC: 32 g/dL (ref 32.0–36.0)
MCV: 99.6 fL (ref 80.0–100.0)
MPV: 8.6 fL (ref 7.5–12.5)
Platelets: 218 10*3/uL (ref 140–400)
RBC: 2.32 10*6/uL — ABNORMAL LOW (ref 4.20–5.80)
RDW: 15.1 % — ABNORMAL HIGH (ref 11.0–15.0)
WBC: 8 10*3/uL (ref 3.8–10.8)

## 2020-07-20 LAB — HIV-1 RNA ULTRAQUANT REFLEX TO GENTYP+
HIV 1 RNA Quant: 152 copies/mL — ABNORMAL HIGH
HIV-1 RNA Quant, Log: 2.18 Log copies/mL — ABNORMAL HIGH

## 2020-07-20 LAB — C-REACTIVE PROTEIN: CRP: 94.8 mg/L — ABNORMAL HIGH (ref <8.0)

## 2020-07-20 LAB — SEDIMENTATION RATE: Sed Rate: 77 mm/h — ABNORMAL HIGH (ref 0–20)

## 2020-07-20 LAB — T-HELPER CELLS (CD4) COUNT (NOT AT ARMC)
Absolute CD4: 218 cells/uL — ABNORMAL LOW (ref 490–1740)
CD4 T Helper %: 12 % — ABNORMAL LOW (ref 30–61)
Total lymphocyte count: 1830 cells/uL (ref 850–3900)

## 2020-07-20 MED ORDER — MEGESTROL ACETATE 625 MG/5ML PO SUSP: 625.0000 mg | Freq: Every day | ORAL | 0 refills | Status: DC

## 2020-07-20 NOTE — Assessment & Plan Note (Addendum)
Here with sister today He is on salvage regimen Encouraged him to establish DNR/medical power of attorney.   Will add megace Will stop bactrim to see if this helps with his nausea Will see him back in 1 month Check CD4 and HIV RNA today

## 2020-07-20 NOTE — Assessment & Plan Note (Signed)
Saw dental today They will take it "step at a time"

## 2020-07-20 NOTE — Assessment & Plan Note (Signed)
He'll continue on levaquin

## 2020-07-20 NOTE — Assessment & Plan Note (Addendum)
Appreciate his local wound care He needs nutrition. Will add megace He is in air-flow bed, boots.

## 2020-07-20 NOTE — Progress Notes (Signed)
   Subjective:    Patient ID: CHAPMAN MATTEUCCI, male  DOB: 1960-06-10, 60 y.o.        MRN: [id number]   HPI 60 yo M with hx of HIV/AIDS, rectal CA, adm to [facility] on [facility]-4 after being found down. He was found to have group B streptococcal bacteremia. 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 He initially required pressors. He had TEE (-). 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 He had aspiration of his hip while in hospital which did not grow bacteria but did show 31,570 WBC. He was treated with Ceftriaxone IVPB to end on 06-28-20. His ART was restarted in hospital (Prezcobix/edurant/tivicay qday). He was also restarted on bactrim.  Today with concerns about sores on his bottom, feet.  1 across his belt line. Painful with changing clothes. He has nursing at his SNF who are applying santyl. Per caregiver accompanying him, they are improving.  Has been nauseous, multiple episodes of emesis. Taking zofran qid and still has queasiness.  Taking ensure.    HIV 1 RNA Quant (copies/mL)  Date Value  06/17/2020 152 (H)  05/13/2020 866,000  11/25/2019 1,020 (H)   CD4 T Cell Abs (/uL)  Date Value  05/13/2020 <35 (L)  11/25/2019 207 (L)  10/15/2019 275 (L)     Health Maintenance  Topic Date Due  . TETANUS/TDAP  Never done  . Zoster Vaccines- Shingrix (1 of 2) Never done  . COLONOSCOPY (Pts 45-37yrs Insurance coverage will need to be confirmed)  Never done  . COVID-19 Vaccine (4 - Booster for Pfizer series) 02/28/2020  . INFLUENZA VACCINE  08/09/2020  . Pneumococcal Vaccine 5-34 Years old (4 - PPSV23 or PCV20) 01/04/2027  . Hepatitis C Screening  Completed  . HIV Screening  Completed  . HPV VACCINES  Aged Out      Review of Systems  Constitutional:  Negative for chills and fever.  Gastrointestinal:  Positive for nausea and vomiting. Negative for constipation and diarrhea.       Early satiety, scared to eat too much due to emesis.   Genitourinary:  Negative for  dysuria.   Please see HPI. All other systems reviewed and negative.     Objective:  Physical Exam Vitals reviewed.  Constitutional:      General: He is not in acute distress.    Appearance: He is ill-appearing.  HENT:     Mouth/Throat:     Mouth: Mucous membranes are moist.     Pharynx: No oropharyngeal exudate.     Comments: Poor dentition Cardiovascular:     Rate and Rhythm: Normal rate and regular rhythm.  Pulmonary:     Effort: Pulmonary effort is normal.     Breath sounds: Normal breath sounds.  Abdominal:     General: Bowel sounds are normal. There is no distension.     Palpations: Abdomen is soft.  Musculoskeletal:     Cervical back: Normal range of motion and neck supple.     Right lower leg: No edema.     Left lower leg: No edema.  Skin:    General: Skin is warm.     Comments: Large dressing on mid-spine.  Neurological:     General: No focal deficit present.     Mental Status: He is alert.           Assessment & Plan:

## 2020-07-21 ENCOUNTER — Other Ambulatory Visit: Payer: Self-pay | Admitting: Infectious Diseases

## 2020-07-21 DIAGNOSIS — B20 Human immunodeficiency virus [HIV] disease: Secondary | ICD-10-CM

## 2020-07-21 LAB — T-HELPER CELL (CD4) - (RCID CLINIC ONLY)
CD4 % Helper T Cell: 15 % — ABNORMAL LOW (ref 33–65)
CD4 T Cell Abs: 334 /uL — ABNORMAL LOW (ref 400–1790)

## 2020-07-22 LAB — HIV-1 RNA QUANT-NO REFLEX-BLD
HIV 1 RNA Quant: 73 Copies/mL — ABNORMAL HIGH
HIV-1 RNA Quant, Log: 1.86 Log cps/mL — ABNORMAL HIGH

## 2020-08-17 ENCOUNTER — Ambulatory Visit: Payer: Medicare Other | Admitting: Infectious Diseases

## 2020-08-17 ENCOUNTER — Other Ambulatory Visit: Payer: Self-pay | Admitting: Infectious Diseases

## 2020-08-17 DIAGNOSIS — B20 Human immunodeficiency virus [HIV] disease: Secondary | ICD-10-CM

## 2020-08-17 NOTE — Telephone Encounter (Signed)
Patient has appt on 08/19/20

## 2020-08-19 ENCOUNTER — Ambulatory Visit (INDEPENDENT_AMBULATORY_CARE_PROVIDER_SITE_OTHER): Payer: Medicare Other | Admitting: Infectious Diseases

## 2020-08-19 ENCOUNTER — Encounter: Payer: Self-pay | Admitting: Infectious Diseases

## 2020-08-19 ENCOUNTER — Other Ambulatory Visit: Payer: Self-pay

## 2020-08-19 VITALS — BP 108/73 | HR 80 | Temp 97.6°F

## 2020-08-19 DIAGNOSIS — K089 Disorder of teeth and supporting structures, unspecified: Secondary | ICD-10-CM

## 2020-08-19 DIAGNOSIS — M00051 Staphylococcal arthritis, right hip: Secondary | ICD-10-CM

## 2020-08-19 DIAGNOSIS — E440 Moderate protein-calorie malnutrition: Secondary | ICD-10-CM | POA: Diagnosis not present

## 2020-08-19 DIAGNOSIS — M86059 Acute hematogenous osteomyelitis, unspecified femur: Secondary | ICD-10-CM

## 2020-08-19 DIAGNOSIS — B351 Tinea unguium: Secondary | ICD-10-CM

## 2020-08-19 DIAGNOSIS — L89152 Pressure ulcer of sacral region, stage 2: Secondary | ICD-10-CM

## 2020-08-19 DIAGNOSIS — B20 Human immunodeficiency virus [HIV] disease: Secondary | ICD-10-CM

## 2020-08-19 DIAGNOSIS — N485 Ulcer of penis: Secondary | ICD-10-CM

## 2020-08-19 MED ORDER — VALTREX 1 G PO TABS
1000.0000 mg | ORAL_TABLET | Freq: Two times a day (BID) | ORAL | 1 refills | Status: AC
Start: 2020-08-19 — End: 2020-08-29

## 2020-08-19 NOTE — Addendum Note (Signed)
Addended by: Teren Franckowiak C on: 08/19/2020 02:40 PM   Modules accepted: Orders

## 2020-08-19 NOTE — Assessment & Plan Note (Signed)
Appreciate Dr Aurea Graff f/u Will repeat his MRI.

## 2020-08-19 NOTE — Assessment & Plan Note (Signed)
Bilateral, will send  To podiatry.

## 2020-08-19 NOTE — Assessment & Plan Note (Signed)
Doing well with megace, ensure.

## 2020-08-19 NOTE — Assessment & Plan Note (Signed)
Will check MRI Appreciate SNF keeping him clean and dry His Alb is 3.1.

## 2020-08-19 NOTE — Assessment & Plan Note (Signed)
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  Will trial valtrex.

## 2020-08-19 NOTE — Assessment & Plan Note (Signed)
Doing much better with megace, DOT CD4 improved Appreciate SNF mgmt Will see him back in 1 month.

## 2020-08-19 NOTE — Progress Notes (Signed)
Subjective:    Patient ID: [name] [name], male  DOB: 05/25/1960, 60 y.o.        MRN: [id number]   HPI 60 yo M with hx of HIV/AIDS, rectal CA, adm to [facility] on 5-4 after being found down. He was found to have group B streptococcal bacteremia. 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 He initially required pressors. He had TEE (-). 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 He had aspiration of his hip while in hospital which did not grow bacteria but did show 31,570 WBC. He was treated with Ceftriaxone IVPB to end on 06-28-20. His ART was restarted in hospital (Prezcobix/edurant/tivicay qday). He was also restarted on bactrim.   At his last visit he had concerns about multipel superficial skin wounds.  Had been nauseous, multiple episodes of emesis. Taking zofran qid and still has queasiness.  Taking ensure.   He was started on megace. His bactrim was stopped.  Having continued issues with foot pain.  He is also getting a wound vac for the wound on his L foot due to persistent drainage. He also has wound on his R foot.  His sister describes what sounds like ABIs for both his legs-  Mild PVD without occlusion.   He has gained 3.8#, megace is "working great".  SNF is concerned that area around sacral decub is red and irritated. Query if he needs to be on anbx.  He is on levaquin.  Zofran was increased, nausea is better.  Also lesion on his penis has recurred. Scab noted then after showering, scab falls off, painful to touch.    Saw Dr [name], suggested if he has continued pain, needs MRI checked by ID. He has no hip pain but does have weakness in his knees.  PT is walking him (increasingly) at SNF.   Labs 08-14-20 Cr 1.12 Alb 3.1 Hgb 10.6 HCT 34.4  Plt 176 CRP 20.3  Has HCP of attorney and Advanced Directives.    HIV 1 RNA Quant  Date Value  07/20/2020 73 Copies/mL (H)  06/17/2020 152 copies/mL (H)  05/13/2020 866,000 copies/mL   CD4 T Cell Abs (/uL)   Date Value  07/20/2020 334 (L)  05/13/2020 <35 (L)  11/25/2019 207 (L)     Health Maintenance  Topic Date Due   TETANUS/TDAP  Never done   Zoster Vaccines- Shingrix (1 of 2) Never done   COLONOSCOPY (Pts 45-73yr Insurance coverage will need to be confirmed)  Never done   COVID-19 Vaccine (4 - Booster for Pfizer series) 02/28/2020   INFLUENZA VACCINE  08/09/2020   Pneumococcal Vaccine [id number] old (4 - PPSV23 or PCV20) 01/04/2027   Hepatitis C Screening  Completed   HIV Screening  Completed   HPV VACCINES  Aged Out      Review of Systems  Constitutional:  Negative for chills and fever.  Respiratory:  Negative for cough and shortness of breath.   Gastrointestinal:  Negative for abdominal pain, constipation, diarrhea and nausea.  Genitourinary:  Negative for dysuria.   Please see HPI. All other systems reviewed and negative.     Objective:  Physical Exam Vitals reviewed.  Constitutional:      Appearance: He is ill-appearing. He is not toxic-appearing.  HENT:     Mouth/Throat:     Mouth: Mucous membranes are moist.     Pharynx: No oropharyngeal exudate.  Eyes:     Extraocular Movements: Extraocular movements intact.  Pupils: Pupils are equal, round, and reactive to light.  Cardiovascular:     Rate and Rhythm: Normal rate and regular rhythm.  Pulmonary:     Effort: Pulmonary effort is normal.     Breath sounds: Normal breath sounds.  Abdominal:     General: Bowel sounds are normal. There is no distension.     Palpations: Abdomen is soft.     Tenderness: There is no abdominal tenderness.  Genitourinary:      Comments: Superficail ulcer, erosion.  Musculoskeletal:     Cervical back: Normal range of motion and neck supple.  Skin:         Comments: Sacral decub seen incompletely. There is mild erythema and heat. No d/c.  2 wounds on each foot. All clean, no gross d/c. Non-tender.              Assessment & Plan:

## 2020-08-19 NOTE — Assessment & Plan Note (Signed)
Has appt with dental pending.

## 2020-08-23 ENCOUNTER — Ambulatory Visit: Payer: Self-pay | Admitting: Podiatry

## 2020-08-31 ENCOUNTER — Ambulatory Visit: Payer: Self-pay | Admitting: Podiatry

## 2020-09-02 ENCOUNTER — Ambulatory Visit
Admission: RE | Admit: 2020-09-02 | Discharge: 2020-09-02 | Disposition: A | Discharge status: Home / Self Care | Payer: Medicare Other | Source: Ambulatory Visit | Attending: Infectious Diseases | Admitting: Infectious Diseases

## 2020-09-02 ENCOUNTER — Other Ambulatory Visit: Payer: Self-pay

## 2020-09-02 DIAGNOSIS — M86059 Acute hematogenous osteomyelitis, unspecified femur: Secondary | ICD-10-CM

## 2020-09-02 MED ORDER — GADOBENATE DIMEGLUMINE 529 MG/ML IV SOLN
11.0000 mL | Freq: Once | INTRAVENOUS | Status: AC | PRN
  Administered 2020-09-02: 11 mL via INTRAVENOUS

## 2020-09-03 ENCOUNTER — Telehealth: Payer: Self-pay

## 2020-09-03 NOTE — Telephone Encounter (Signed)
Received call today from [facility] with [facility] with results on MRI of Sacrum. Will forward results message to provider. MR SACRUM SI JOINTS W WO CONTRAST   CLINICAL DATA:  Osteomyelitis   EXAM: MRI SACRUM WITHOUT AND WITH CONTRAST   TECHNIQUE: Multiplanar multi-sequence MR imaging of the sacrum was performed without and with contrast.   11 mL MultiHance contrast was administered.   COMPARISON:  None.   FINDINGS: Urinary Tract:  Marked distention of the urinary bladder.   Bowel:  Stool burden in the colon suggesting constipation.   Vascular/Lymphatic: No pathologically enlarged lymph nodes. No significant vascular abnormality seen.   Reproductive:  The prostate is only partially visualized.   Other: Degenerative disc changes at L4-L5 and L5-S1. 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   Musculoskeletal:   There is edema signal, low T1 signal, and enhancement within the sacrum at the level of S4 and S5. There is adjacent soft tissue swelling and enhancement in the presacral tissues. Dorsally, there is thinning of the soft tissues compatible with a sacral decubitus ulcer. No rim enhancing collection at this time.   There is feathery intramuscular edema within the piriformis muscles and hip adductors bilaterally, this likely reactive.   The SI joints are unremarkable without SI joint effusion or erosion. Partially visualized right hip arthroplasty with associated susceptibility artifact. Partially visualized left hip arthritis.   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 No drainable soft tissue abscess at this time.   Feathery intramuscular edema within the piriformis muscles and hip adductors bilaterally, likely reactive.   These results will be called to the ordering clinician or representative by the Radiologist Assistant, and communication documented in the  PACS or [employer].     Electronically Signed   By: Maurine Simmering M.D.   On: 09/03/2020 16:17

## 2020-09-10 ENCOUNTER — Telehealth: Payer: Self-pay | Admitting: Infectious Diseases

## 2020-09-10 NOTE — Telephone Encounter (Signed)
Spoke with sister- he had "deep debrieding" of his wound and like a stem cell placement.  He is on po levaquin.  Let them know that his MRI showed infection, inflammation of his sacrum.  Will further discuss at his f/u as well.  [phone] ([name] [name]'s number):

## 2020-09-10 NOTE — Telephone Encounter (Signed)
Received call from wound care nurse, she states that provider Martinique Miller, NP at [facility] will be starting the patient on IV vancomycin and Zosyn based on MRI results. They are arranging PICC placement. Will route to provider.   Beryle Flock, RN

## 2020-09-10 NOTE — Telephone Encounter (Signed)
Patient called back to return Dr. Algis Downs phone call. I relayed MRI results to the patient and let him know [name] with discuss his results more at his appointment .  [name] [name]

## 2020-09-16 ENCOUNTER — Telehealth: Payer: Self-pay | Admitting: Pharmacist

## 2020-09-16 ENCOUNTER — Other Ambulatory Visit: Payer: Self-pay

## 2020-09-16 ENCOUNTER — Encounter: Payer: Self-pay | Admitting: Infectious Diseases

## 2020-09-16 ENCOUNTER — Ambulatory Visit (INDEPENDENT_AMBULATORY_CARE_PROVIDER_SITE_OTHER): Payer: Medicare Other | Admitting: Infectious Diseases

## 2020-09-16 DIAGNOSIS — K089 Disorder of teeth and supporting structures, unspecified: Secondary | ICD-10-CM | POA: Diagnosis not present

## 2020-09-16 DIAGNOSIS — L89152 Pressure ulcer of sacral region, stage 2: Secondary | ICD-10-CM

## 2020-09-16 DIAGNOSIS — B20 Human immunodeficiency virus [HIV] disease: Secondary | ICD-10-CM

## 2020-09-16 DIAGNOSIS — B3781 Candidal esophagitis: Secondary | ICD-10-CM | POA: Diagnosis not present

## 2020-09-16 DIAGNOSIS — R197 Diarrhea, unspecified: Secondary | ICD-10-CM

## 2020-09-16 DIAGNOSIS — B370 Candidal stomatitis: Secondary | ICD-10-CM

## 2020-09-16 MED ORDER — FLUCONAZOLE 10 MG/ML PO SUSR: 100.0000 mg | Freq: Every day | ORAL | 1 refills | Status: AC

## 2020-09-16 NOTE — Telephone Encounter (Signed)
Patient has $7500 PAF grant for his HIV medications that expires in November. He states he received a letter which states he has overdue payments. I will put a copy of the letter in the media section of his profile. Would you be able to look into this [name] [name]?  Thanks!  Alfonse Spruce, PharmD, CPP Clinical Pharmacist Practitioner [facility]

## 2020-09-16 NOTE — Progress Notes (Signed)
Subjective:    Patient ID: [name] [name], male  DOB: Nov 21, 1960, 60 y.o.        MRN: [id number]   HPI 60 yo M with hx of HIV/AIDS, rectal CA, adm to [facility] on 5-4 after being found down. He was found to have group B streptococcal bacteremia. 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 He initially required pressors. He had TEE (-). 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 He had aspiration of his hip while in hospital which did not grow bacteria but did show 31,570 WBC. He was treated with Ceftriaxone IVPB to end on 06-28-20. His ART was restarted in hospital (Prezcobix/edurant/tivicay qday). He was also restarted on bactrim.   He was started on megace. His bactrim was stopped.  Having continued issues with foot pain.  He is also getting a wound vac for the wound on his L foot due to persistent drainage. He also has wound on his R foot.  His sister describes what sounds like ABIs for both his legs-  Mild PVD without occlusion.    He has gained 3.8#, megace is "working great".   SNF is concerned that area around sacral decub is red and irritated at his prev episode. 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 No drainable soft tissue abscess at this time.  -Feathery intramuscular edema within the piriformis muscles and hip adductors bilaterally, likely reactive. He was also found to have wound on his L foot- plain films- no osteo.    His sister spoke with me about the results, he had PIC 9-3 at SNF and anbx started (vanco/zosyn) on 09-12-20.   He has a wound vac on his L foot and a dressing on his sacral wound.  Since starting on anbx he has had mild nausea and "gas". Has also had loose BM (watery). Has been eating well, gained was 131.7 2 days ago. (Up 2.5 #).    Labs 08-14-20 Cr 1.12 Alb 3.1 Hgb 10.6 HCT 34.4  Plt 176 CRP 20.3  HIV 1 RNA Quant  Date  Value  07/20/2020 73 Copies/mL (H)  06/17/2020 152 copies/mL (H)  05/13/2020 866,000 copies/mL   CD4 T Cell Abs (/uL)  Date Value  07/20/2020 334 (L)  05/13/2020 <35 (L)  11/25/2019 207 (L)     Health Maintenance  Topic Date Due  . TETANUS/TDAP  Never done  . Zoster Vaccines- Shingrix (1 of 2) Never done  . COLONOSCOPY (Pts 45-91yr Insurance coverage will need to be confirmed)  Never done  . COVID-19 Vaccine (4 - Booster for Pfizer series) 02/20/2020  . INFLUENZA VACCINE  08/09/2020  . Pneumococcal Vaccine 017680Years old (4 - PPSV23 or PCV20) 01/04/2027  . Hepatitis C Screening  Completed  . HIV Screening  Completed  . HPV VACCINES  Aged Out      Review of Systems  Constitutional:  Negative for chills and fever.  Gastrointestinal:  Positive for diarrhea. Negative for constipation.  Genitourinary:  Negative for dysuria.   Please see HPI. All other systems reviewed and negative.     Objective:  Physical Exam Vitals reviewed.  Constitutional:      General: He is not in acute distress.    Appearance: He is not diaphoretic.  HENT:     Mouth/Throat:     Mouth: Mucous membranes are moist.     Pharynx: Oropharyngeal exudate  present.  Eyes:     Extraocular Movements: Extraocular movements intact.     Pupils: Pupils are equal, round, and reactive to light.  Cardiovascular:     Rate and Rhythm: Normal rate and regular rhythm.  Pulmonary:     Effort: Pulmonary effort is normal.     Breath sounds: Normal breath sounds.  Abdominal:     General: Bowel sounds are normal. There is no distension.     Palpations: Abdomen is soft.     Tenderness: There is no abdominal tenderness.  Musculoskeletal:        General: Normal range of motion.     Cervical back: Normal range of motion.     Right lower leg: No edema.     Left lower leg: No edema.  Skin:         Comments: Vac on , appears clean.   Neurological:     Mental Status: He is alert.          Assessment & Plan:

## 2020-09-16 NOTE — Assessment & Plan Note (Signed)
He appears to be doing well at last blood draw.  His CD4 was > 200 and his VL was low (<100).  Will continue his current rx Flu shot at SNF.  Wait for new COVID booster to come out.  rtc in 4 weeks.

## 2020-09-16 NOTE — Assessment & Plan Note (Signed)
Please use imodium.

## 2020-09-16 NOTE — Assessment & Plan Note (Addendum)
Multiple factors- his CD4 is good but his megace and anbx probably predispose him. Will write rx for liquid fluconazole.  PLEASE STOP THORAZINE WHILE ON THIS

## 2020-09-16 NOTE — Assessment & Plan Note (Signed)
Has had some teeth pulled, needs more pulled

## 2020-09-16 NOTE — Assessment & Plan Note (Addendum)
He is on vanco zosyn Will expect that he will get 6 weeks.  Have asked that the SNF send [place] his labs after next draw.  Will see him back in 4 weeks Discussed with his sister that f/u of wound is based on his CRP and ESR.  Stop levaquin while on IV anbx.

## 2020-09-17 NOTE — Telephone Encounter (Signed)
See above note

## 2020-09-17 NOTE — Telephone Encounter (Signed)
I will take a look at his information in PAF

## 2020-10-04 ENCOUNTER — Telehealth: Payer: Self-pay

## 2020-10-04 NOTE — Telephone Encounter (Signed)
This guy has a lot going on. I wonder if he actually stopped the levaquin like Dr. Algis Downs note discussed a few weeks ago. I don't think the vanc/zosyn is causing confusion. Urine was negative, vanc trough was 19.4, SCr ok at 1.33, which seems to be around his baseline. Sodium 140. I personally don't think the confusion has to do with antibiotics or his decubitus sacral ulcer, but will let [name] weigh in.

## 2020-10-04 NOTE — Telephone Encounter (Signed)
Wound care nurse at patient's facility, [facility], called to report that patient has developed increasing confusion over the last 2-3 weeks. The provider there ordered a urinalysis, chest xray, and labs for kidney function.   Nelwyn Salisbury reports that all testing was normal and she will fax results to our office for provider to review.   She states the family is concerned that his worsening mental status is due to either the antibiotics or possible worsening infection. Will route to provider.   Beryle Flock, RN

## 2020-10-05 NOTE — Telephone Encounter (Signed)
Sure - just uploaded them in the media section under chart review.

## 2020-10-09 ENCOUNTER — Emergency Department (HOSPITAL_COMMUNITY): Payer: Medicare Other

## 2020-10-09 ENCOUNTER — Encounter (HOSPITAL_COMMUNITY): Payer: Self-pay

## 2020-10-09 ENCOUNTER — Inpatient Hospital Stay (HOSPITAL_COMMUNITY)
Admission: EM | Admit: 2020-10-09 | Discharge: 2020-10-14 | DRG: 091 | Disposition: A | Discharge status: Skilled Nursing Facility | Payer: Medicare Other | Source: Skilled Nursing Facility | Attending: Internal Medicine | Admitting: Internal Medicine

## 2020-10-09 ENCOUNTER — Other Ambulatory Visit: Payer: Self-pay

## 2020-10-09 DIAGNOSIS — E519 Thiamine deficiency, unspecified: Secondary | ICD-10-CM | POA: Diagnosis present

## 2020-10-09 DIAGNOSIS — E870 Hyperosmolality and hypernatremia: Secondary | ICD-10-CM

## 2020-10-09 DIAGNOSIS — Z66 Do not resuscitate: Secondary | ICD-10-CM | POA: Diagnosis present

## 2020-10-09 DIAGNOSIS — M4628 Osteomyelitis of vertebra, sacral and sacrococcygeal region: Secondary | ICD-10-CM | POA: Diagnosis present

## 2020-10-09 DIAGNOSIS — N179 Acute kidney failure, unspecified: Secondary | ICD-10-CM | POA: Diagnosis not present

## 2020-10-09 DIAGNOSIS — L89152 Pressure ulcer of sacral region, stage 2: Secondary | ICD-10-CM | POA: Diagnosis present

## 2020-10-09 DIAGNOSIS — Z96641 Presence of right artificial hip joint: Secondary | ICD-10-CM | POA: Diagnosis present

## 2020-10-09 DIAGNOSIS — Z681 Body mass index (BMI) 19 or less, adult: Secondary | ICD-10-CM

## 2020-10-09 DIAGNOSIS — Z7401 Bed confinement status: Secondary | ICD-10-CM

## 2020-10-09 DIAGNOSIS — Z8249 Family history of ischemic heart disease and other diseases of the circulatory system: Secondary | ICD-10-CM

## 2020-10-09 DIAGNOSIS — G2119 Other drug induced secondary parkinsonism: Secondary | ICD-10-CM | POA: Diagnosis present

## 2020-10-09 DIAGNOSIS — Z87891 Personal history of nicotine dependence: Secondary | ICD-10-CM

## 2020-10-09 DIAGNOSIS — E860 Dehydration: Secondary | ICD-10-CM

## 2020-10-09 DIAGNOSIS — G9341 Metabolic encephalopathy: Secondary | ICD-10-CM

## 2020-10-09 DIAGNOSIS — N1831 Chronic kidney disease, stage 3a: Secondary | ICD-10-CM | POA: Diagnosis present

## 2020-10-09 DIAGNOSIS — R29898 Other symptoms and signs involving the musculoskeletal system: Secondary | ICD-10-CM | POA: Diagnosis present

## 2020-10-09 DIAGNOSIS — Z91048 Other nonmedicinal substance allergy status: Secondary | ICD-10-CM

## 2020-10-09 DIAGNOSIS — Z809 Family history of malignant neoplasm, unspecified: Secondary | ICD-10-CM

## 2020-10-09 DIAGNOSIS — G9340 Encephalopathy, unspecified: Secondary | ICD-10-CM | POA: Diagnosis present

## 2020-10-09 DIAGNOSIS — Z86711 Personal history of pulmonary embolism: Secondary | ICD-10-CM

## 2020-10-09 DIAGNOSIS — I5032 Chronic diastolic (congestive) heart failure: Secondary | ICD-10-CM | POA: Diagnosis not present

## 2020-10-09 DIAGNOSIS — E876 Hypokalemia: Secondary | ICD-10-CM | POA: Diagnosis present

## 2020-10-09 DIAGNOSIS — R532 Functional quadriplegia: Secondary | ICD-10-CM | POA: Diagnosis present

## 2020-10-09 DIAGNOSIS — D649 Anemia, unspecified: Secondary | ICD-10-CM | POA: Diagnosis present

## 2020-10-09 DIAGNOSIS — T450X5A Adverse effect of antiallergic and antiemetic drugs, initial encounter: Secondary | ICD-10-CM | POA: Diagnosis present

## 2020-10-09 DIAGNOSIS — B370 Candidal stomatitis: Secondary | ICD-10-CM | POA: Diagnosis present

## 2020-10-09 DIAGNOSIS — R1310 Dysphagia, unspecified: Secondary | ICD-10-CM | POA: Diagnosis present

## 2020-10-09 DIAGNOSIS — G928 Other toxic encephalopathy: Secondary | ICD-10-CM | POA: Diagnosis not present

## 2020-10-09 DIAGNOSIS — E872 Acidosis, unspecified: Secondary | ICD-10-CM | POA: Diagnosis present

## 2020-10-09 DIAGNOSIS — L8990 Pressure ulcer of unspecified site, unspecified stage: Secondary | ICD-10-CM | POA: Insufficient documentation

## 2020-10-09 DIAGNOSIS — B20 Human immunodeficiency virus [HIV] disease: Secondary | ICD-10-CM | POA: Diagnosis present

## 2020-10-09 DIAGNOSIS — Z85048 Personal history of other malignant neoplasm of rectum, rectosigmoid junction, and anus: Secondary | ICD-10-CM

## 2020-10-09 DIAGNOSIS — I130 Hypertensive heart and chronic kidney disease with heart failure and stage 1 through stage 4 chronic kidney disease, or unspecified chronic kidney disease: Secondary | ICD-10-CM | POA: Diagnosis present

## 2020-10-09 DIAGNOSIS — E538 Deficiency of other specified B group vitamins: Secondary | ICD-10-CM | POA: Diagnosis present

## 2020-10-09 DIAGNOSIS — E43 Unspecified severe protein-calorie malnutrition: Secondary | ICD-10-CM | POA: Diagnosis present

## 2020-10-09 DIAGNOSIS — L89622 Pressure ulcer of left heel, stage 2: Secondary | ICD-10-CM | POA: Diagnosis present

## 2020-10-09 DIAGNOSIS — D539 Nutritional anemia, unspecified: Secondary | ICD-10-CM | POA: Diagnosis present

## 2020-10-09 DIAGNOSIS — L89159 Pressure ulcer of sacral region, unspecified stage: Secondary | ICD-10-CM

## 2020-10-09 DIAGNOSIS — M109 Gout, unspecified: Secondary | ICD-10-CM | POA: Diagnosis present

## 2020-10-09 DIAGNOSIS — L89612 Pressure ulcer of right heel, stage 2: Secondary | ICD-10-CM | POA: Diagnosis present

## 2020-10-09 DIAGNOSIS — Z86718 Personal history of other venous thrombosis and embolism: Secondary | ICD-10-CM

## 2020-10-09 DIAGNOSIS — Z923 Personal history of irradiation: Secondary | ICD-10-CM

## 2020-10-09 DIAGNOSIS — Z20822 Contact with and (suspected) exposure to covid-19: Secondary | ICD-10-CM | POA: Diagnosis present

## 2020-10-09 DIAGNOSIS — Z79899 Other long term (current) drug therapy: Secondary | ICD-10-CM

## 2020-10-09 HISTORY — DX: Metabolic encephalopathy: G93.41

## 2020-10-09 LAB — CBC WITH DIFFERENTIAL/PLATELET
Abs Immature Granulocytes: 0.58 10*3/uL — ABNORMAL HIGH (ref 0.00–0.07)
Basophils Absolute: 0 10*3/uL (ref 0.0–0.1)
Basophils Relative: 0 %
Eosinophils Absolute: 0.1 10*3/uL (ref 0.0–0.5)
Eosinophils Relative: 1 %
HCT: 32.9 % — ABNORMAL LOW (ref 39.0–52.0)
Hemoglobin: 10.5 g/dL — ABNORMAL LOW (ref 13.0–17.0)
Immature Granulocytes: 6 %
Lymphocytes Relative: 13 %
Lymphs Abs: 1.3 10*3/uL (ref 0.7–4.0)
MCH: 33.3 pg (ref 26.0–34.0)
MCHC: 31.9 g/dL (ref 30.0–36.0)
MCV: 104.4 fL — ABNORMAL HIGH (ref 80.0–100.0)
Monocytes Absolute: 0.7 10*3/uL (ref 0.1–1.0)
Monocytes Relative: 7 %
Neutro Abs: 7.5 10*3/uL (ref 1.7–7.7)
Neutrophils Relative %: 73 %
Platelets: 147 10*3/uL — ABNORMAL LOW (ref 150–400)
RBC: 3.15 MIL/uL — ABNORMAL LOW (ref 4.22–5.81)
RDW: 15.7 % — ABNORMAL HIGH (ref 11.5–15.5)
WBC: 10.3 10*3/uL (ref 4.0–10.5)
nRBC: 1 % — ABNORMAL HIGH (ref 0.0–0.2)

## 2020-10-09 LAB — URINALYSIS, ROUTINE W REFLEX MICROSCOPIC
Bilirubin Urine: NEGATIVE
Glucose, UA: NEGATIVE mg/dL
Hgb urine dipstick: NEGATIVE
Ketones, ur: NEGATIVE mg/dL
Leukocytes,Ua: NEGATIVE
Nitrite: NEGATIVE
Protein, ur: NEGATIVE mg/dL
Specific Gravity, Urine: 1.016 (ref 1.005–1.030)
pH: 5 (ref 5.0–8.0)

## 2020-10-09 LAB — RAPID URINE DRUG SCREEN, HOSP PERFORMED
Amphetamines: NOT DETECTED
Barbiturates: NOT DETECTED
Benzodiazepines: NOT DETECTED
Cocaine: NOT DETECTED
Opiates: POSITIVE — AB
Tetrahydrocannabinol: NOT DETECTED

## 2020-10-09 LAB — COMPREHENSIVE METABOLIC PANEL
ALT: 35 U/L (ref 0–44)
AST: 30 U/L (ref 15–41)
Albumin: 3.3 g/dL — ABNORMAL LOW (ref 3.5–5.0)
Alkaline Phosphatase: 37 U/L — ABNORMAL LOW (ref 38–126)
Anion gap: 12 (ref 5–15)
BUN: 43 mg/dL — ABNORMAL HIGH (ref 6–20)
CO2: 20 mmol/L — ABNORMAL LOW (ref 22–32)
Calcium: 9.8 mg/dL (ref 8.9–10.3)
Chloride: 116 mmol/L — ABNORMAL HIGH (ref 98–111)
Creatinine, Ser: 1.76 mg/dL — ABNORMAL HIGH (ref 0.61–1.24)
GFR, Estimated: 44 mL/min — ABNORMAL LOW (ref >60)
Glucose, Bld: 125 mg/dL — ABNORMAL HIGH (ref 70–99)
Potassium: 3.9 mmol/L (ref 3.5–5.1)
Sodium: 148 mmol/L — ABNORMAL HIGH (ref 135–145)
Total Bilirubin: 0.9 mg/dL (ref 0.3–1.2)
Total Protein: 6.7 g/dL (ref 6.5–8.1)

## 2020-10-09 LAB — LACTIC ACID, PLASMA: Lactic Acid, Venous: 1.6 mmol/L (ref 0.5–1.9)

## 2020-10-09 LAB — RESP PANEL BY RT-PCR (FLU A&B, COVID) ARPGX2
Influenza A by PCR: NEGATIVE
Influenza B by PCR: NEGATIVE
SARS Coronavirus 2 by RT PCR: NEGATIVE

## 2020-10-09 LAB — CREATININE, URINE, RANDOM: Creatinine, Urine: 63.96 mg/dL

## 2020-10-09 LAB — SEDIMENTATION RATE: Sed Rate: 50 mm/hr — ABNORMAL HIGH (ref 0–16)

## 2020-10-09 LAB — PROTIME-INR
INR: 1.1 (ref 0.8–1.2)
Prothrombin Time: 13.9 seconds (ref 11.4–15.2)

## 2020-10-09 LAB — C-REACTIVE PROTEIN: CRP: 1.4 mg/dL — ABNORMAL HIGH (ref <1.0)

## 2020-10-09 LAB — SODIUM, URINE, RANDOM: Sodium, Ur: 126 mmol/L

## 2020-10-09 MED ORDER — ACETAMINOPHEN 325 MG PO TABS
650.0000 mg | ORAL_TABLET | Freq: Four times a day (QID) | ORAL | Status: DC | PRN
  Administered 2020-10-11: 650 mg via ORAL
  Filled 2020-10-09: qty 2

## 2020-10-09 MED ORDER — ORAL CARE MOUTH RINSE
15.0000 mL | Freq: Two times a day (BID) | OROMUCOSAL | Status: DC
  Administered 2020-10-10 – 2020-10-14 (×7): 15 mL via OROMUCOSAL

## 2020-10-09 MED ORDER — BACLOFEN 10 MG PO TABS
5.0000 mg | ORAL_TABLET | Freq: Three times a day (TID) | ORAL | Status: DC
  Administered 2020-10-10 (×3): 5 mg via ORAL
  Filled 2020-10-09 (×4): qty 1

## 2020-10-09 MED ORDER — CHLORHEXIDINE GLUCONATE CLOTH 2 % EX PADS
6.0000 | MEDICATED_PAD | Freq: Every day | CUTANEOUS | Status: DC
  Administered 2020-10-09 – 2020-10-14 (×5): 6 via TOPICAL

## 2020-10-09 MED ORDER — RILPIVIRINE HCL 25 MG PO TABS
25.0000 mg | ORAL_TABLET | Freq: Every day | ORAL | Status: DC
  Administered 2020-10-10 – 2020-10-14 (×5): 25 mg via ORAL
  Filled 2020-10-09 (×5): qty 1

## 2020-10-09 MED ORDER — SODIUM CHLORIDE 0.9 % IV BOLUS
1000.0000 mL | Freq: Once | INTRAVENOUS | Status: AC
  Administered 2020-10-09: 1000 mL via INTRAVENOUS

## 2020-10-09 MED ORDER — THIAMINE HCL 100 MG/ML IJ SOLN
500.0000 mg | Freq: Once | INTRAVENOUS | Status: AC
  Administered 2020-10-09: 500 mg via INTRAVENOUS
  Filled 2020-10-09: qty 5

## 2020-10-09 MED ORDER — ACETAMINOPHEN 650 MG RE SUPP: 650.0000 mg | Freq: Four times a day (QID) | RECTAL | Status: DC | PRN

## 2020-10-09 MED ORDER — DOLUTEGRAVIR SODIUM 50 MG PO TABS
50.0000 mg | ORAL_TABLET | Freq: Every day | ORAL | Status: DC
  Administered 2020-10-10 – 2020-10-14 (×5): 50 mg via ORAL
  Filled 2020-10-09 (×5): qty 1

## 2020-10-09 MED ORDER — MEGESTROL ACETATE 400 MG/10ML PO SUSP
800.0000 mg | Freq: Every day | ORAL | Status: DC
  Administered 2020-10-10 – 2020-10-14 (×5): 800 mg via ORAL
  Filled 2020-10-09 (×5): qty 20

## 2020-10-09 MED ORDER — THIAMINE HCL 100 MG PO TABS
100.0000 mg | ORAL_TABLET | Freq: Every day | ORAL | Status: DC
  Administered 2020-10-10 – 2020-10-11 (×2): 100 mg via ORAL
  Filled 2020-10-09 (×2): qty 1

## 2020-10-09 MED ORDER — PIPERACILLIN-TAZOBACTAM 3.375 G IVPB
3.3750 g | Freq: Three times a day (TID) | INTRAVENOUS | Status: DC
  Administered 2020-10-09 – 2020-10-11 (×5): 3.375 g via INTRAVENOUS
  Filled 2020-10-09 (×5): qty 50

## 2020-10-09 MED ORDER — DEXTROSE-NACL 5-0.45 % IV SOLN: INTRAVENOUS | Status: DC

## 2020-10-09 MED ORDER — MEGESTROL ACETATE 625 MG/5ML PO SUSP: 625.0000 mg | Freq: Every day | ORAL | Status: DC

## 2020-10-09 MED ORDER — DARUNAVIR-COBICISTAT 800-150 MG PO TABS
1.0000 | ORAL_TABLET | Freq: Every day | ORAL | Status: DC
  Administered 2020-10-10: 1 via ORAL
  Filled 2020-10-09: qty 1

## 2020-10-09 MED ORDER — ENSURE ENLIVE PO LIQD
237.0000 mL | Freq: Two times a day (BID) | ORAL | Status: DC
  Administered 2020-10-09 – 2020-10-11 (×4): 237 mL via ORAL
  Filled 2020-10-09: qty 237

## 2020-10-09 MED ORDER — CHLORHEXIDINE GLUCONATE 0.12% ORAL RINSE (MEDLINE KIT)
15.0000 mL | Freq: Two times a day (BID) | OROMUCOSAL | Status: DC
  Administered 2020-10-09 – 2020-10-14 (×7): 15 mL via OROMUCOSAL

## 2020-10-09 NOTE — ED Notes (Signed)
Second set [facility] drawn Right Hand

## 2020-10-09 NOTE — ED Triage Notes (Signed)
From [facility], EMS called for infection, pt not feeling well, has a history of untreated strep B, and osteomyelitis.  Pt has a PICC in Right arm.  Per EMS pt had 9 teeth removed 3-4 days ago and had declined in health now

## 2020-10-09 NOTE — Progress Notes (Addendum)
Pharmacy Antibiotic Note  [name] [name] is a 60 y.o. 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  Pharmacy has been consulted for Zosyn and Vancomycin dosing.  Zosyn noted on MAR from SNF, unable to determine if currently onl Vancomycin from SNF information.   [facility] office note 9/8 stated on Vanc/Zosyn and planned 6 wk course, with follow up at [facility] in one month, and stop Levaquin while on IV abx. Confusion noted after that visit, 9/26 [facility] office communication noted still on Vanc/Zosyn.   Plan: Zosyn 3.375gm q8 - 4 hr infusion Vancomycin random level, determine if currently on Pharmacy to dose Vancomycin based on random level  Height: 6\' 2"  (188 cm) Weight: 72 kg (158 lb 11.7 oz) IBW/kg (Calculated) : 82.2  Temp (24hrs), Avg:98.4 F (36.9 C), Min:98.4 F (36.9 C), Max:98.4 F (36.9 C)  Recent Labs  Lab 10/09/20 1452  WBC 10.3  CREATININE 1.76*  LATICACIDVEN 1.6    Estimated Creatinine Clearance: 46.6 mL/min (A) (by C-G formula based on SCr of 1.76 mg/dL (H)).    Allergies  Allergen Reactions   Collagen Rash    redness    Antimicrobials this admission: 10/1 Zosyn >>  ?  Vancomycin >>   Dose adjustments this admission:  Microbiology results: 10/1 BCx: sent 10/1 UCx: sent  Thank you for allowing pharmacy to be a part of this patient's care.  Minda Ditto PharmD 10/09/2020 9:07 PM

## 2020-10-09 NOTE — ED Notes (Signed)
Patient needs food chopped up for him.   Patient also will drink chocolate ensure and ice cream

## 2020-10-09 NOTE — H&P (Addendum)
[name] [name] EKB:[id number] DOB: [birth year] DOA: 10/09/2020     PCP: [name], MD   Outpatient Specialists:   Dr [name] from Orthopedics. ID Dr [name]  Patient arrived to ER on 10/09/20 at 1437 Referred by Attending Toy Baker, MD   Patient coming from:   From facility [facility],  Chief Complaint: confusion HPI: [name] [name] is a 60 y.o. 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    Presented with  increased confusion over past 3 wks while at a SNF  urinalysis, chest xray, and labs for kidney function. done at facility and was unremarkable Of note pt had  9 teeth removed 3-4 days ago and since then started to do worse  Had a complicated admission in May 2022 He had multiple sub-acute infarcts on his head CT. He initially required pressors He was treated with Ceftriaxone IVPB to end on 06-28-20 Dysphagia on dysphagia 3 diet with aspiration precautions. HIV during his last hospitalization His ART was restarted in hospital Prezcobix/edurant/tivicay qday  Has multiple ulcers including wound Vac on his L foot . He also has wound on his R foot.  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 No drainable soft tissue abscess at this time.  Has been on VANC and ZOSYN through PICC line   been vaccinated against COVID and boosted   Initial COVID TEST  NEGATIVE   Lab Results  Component Value Date   SARSCOV2NAA NEGATIVE 10/09/2020   [place] NEGATIVE 05/27/2020   [place] NEGATIVE 05/12/2020   [place] NEGATIVE 03/26/2020     Regarding pertinent Chronic problems:   Chronic decubitus  ulcers and sacral osteo on zosyn/vanc  HIV on PREZCOBIX EDURANT TIVICAY     chronic CHF diastolic  last echo may 2022 Grade I diastolic     Hx of DVT/PE on - not on anticoagulation with       Chronic anemia - baseline hg Hemoglobin & Hematocrit  Recent Labs    05/27/20 0409 06/17/20 0447 10/09/20 1452  HGB 7.4* 7.4* 10.5*    While in ER: CXR CT head un remarkable  U no evidence of UTI    ED Triage Vitals  Enc Vitals Group     BP 10/09/20 1447 (!) 127/116     Pulse Rate 10/09/20 1447 85     Resp 10/09/20 1447 16     Temp 10/09/20 1447 98.4 F (36.9 C)     Temp Source 10/09/20 1447 Oral     SpO2 10/09/20 1447 98 %     Weight 10/09/20 1448 158 lb 11.7 oz (72 kg)     Height 10/09/20 1448 6\' 2"  (1.88 m)     Head Circumference --      Peak Flow --      Pain Score 10/09/20 1448 0     Pain Loc --      Pain Edu? --      Excl. in [place]? --   TMAX(24)@     _________________________________________ Significant initial  Findings: Abnormal Labs Reviewed  COMPREHENSIVE METABOLIC PANEL - Abnormal; Notable for the following components:      Result Value   Sodium 148 (*)  Chloride 116 (*)    CO2 20 (*)    Glucose, Bld 125 (*)    BUN 43 (*)    Creatinine, Ser 1.76 (*)    Albumin 3.3 (*)    Alkaline Phosphatase 37 (*)    GFR, Estimated 44 (*)    All other components within normal limits  CBC WITH DIFFERENTIAL/PLATELET - Abnormal; Notable for the following components:   RBC 3.15 (*)    Hemoglobin 10.5 (*)    HCT 32.9 (*)    MCV 104.4 (*)    RDW 15.7 (*)    Platelets 147 (*)    nRBC 1.0 (*)    Abs Immature Granulocytes 0.58 (*)    All other components within normal limits  C-REACTIVE PROTEIN - Abnormal; Notable for the following components:   CRP 1.4 (*)    All other components within normal limits  SEDIMENTATION RATE - Abnormal; Notable for the following components:   Sed Rate 50 (*)    All other components within normal limits    ____________________________________________ Ordered CT HEAD   NON acute  CXR -  NON acute     No evidence of sepsis  The recent clinical data is shown below. Vitals:   10/09/20 1600 10/09/20 1630 10/09/20 1700 10/09/20 1800  BP: 121/83 124/82 (!) 151/97 (!) 124/91  Pulse: 74 70 96 75  Resp: 16 19 15  (!) 22  Temp:      TempSrc:      SpO2: 96% 99% 100% 99%  Weight:      Height:         WBC     Component Value Date/Time   WBC 10.3 10/09/2020 1452   LYMPHSABS 1.3 10/09/2020 1452   LYMPHSABS 0.9 06/19/2008 1117   MONOABS 0.7 10/09/2020 1452   MONOABS 0.3 06/19/2008 1117   EOSABS 0.1 10/09/2020 1452   EOSABS 0.2 06/19/2008 1117   BASOSABS 0.0 10/09/2020 1452   BASOSABS 0.0 06/19/2008 1117    Lactic Acid, Venous    Component Value Date/Time   LATICACIDVEN 1.6 10/09/2020 1452    Procalcitonin  Ordered     UA  no evidence of UTI  Urine analysis:    Component Value Date/Time   COLORURINE YELLOW 10/09/2020 1800   APPEARANCEUR CLEAR 10/09/2020 1800   LABSPEC 1.016 10/09/2020 1800   LABSPEC 1.030 04/11/2007 1446   PHURINE 5.0 10/09/2020 1800   GLUCOSEU NEGATIVE 10/09/2020 1800   HGBUR NEGATIVE 10/09/2020 1800   BILIRUBINUR NEGATIVE 10/09/2020 1800   BILIRUBINUR Negative 04/11/2007 1446   KETONESUR NEGATIVE 10/09/2020 1800   PROTEINUR NEGATIVE 10/09/2020 1800   UROBILINOGEN 0.2 06/10/2014 1303   NITRITE NEGATIVE 10/09/2020 1800   LEUKOCYTESUR NEGATIVE 10/09/2020 1800   LEUKOCYTESUR Negative 04/11/2007 1446    Results for orders placed or performed during the hospital encounter of 10/09/20  Resp Panel by RT-PCR (Flu A&B, Covid) Nasopharyngeal Swab     Status: None   Collection Time: 10/09/20  6:25 PM   Specimen: Nasopharyngeal Swab; Nasopharyngeal(NP) swabs in vial transport medium  Result Value Ref Range Status   SARS Coronavirus 2 by RT PCR NEGATIVE NEGATIVE Final         Influenza A by PCR NEGATIVE NEGATIVE Final   Influenza B by PCR NEGATIVE NEGATIVE  Final           _______________________________________________________ ER Provider Called:  ID   Dr.Comer They Recommend admit to medicine  no need to change ABX Will see in AM    _______________________________________________  Hospitalist was called for admission for acute encephalopathy  The following Work up has been ordered so far:  Orders Placed This Encounter  Procedures   Urine Culture   Blood Culture (routine x 2)   Resp Panel by RT-PCR (Flu A&B, Covid) Nasopharyngeal Swab   DG Chest Port 1 View   CT Head Wo Contrast   Comprehensive metabolic panel   CBC with Differential   Protime-INR   Urinalysis, Routine w reflex microscopic   C-reactive protein   Sedimentation rate   Ammonia   Diet NPO time specified   Saline Lock IV, Maintain IV access (when placed in a treatment room)   Cardiac monitoring   Document height and weight   Assess and Document Glasgow Coma Scale   Document vital signs within 1-hour of fluid bolus completion.  Notify provider of abnormal vital signs despite fluid resuscitation.   Refer to Sidebar Report: Sepsis Bundle ED/IP   Notify provider for difficulties obtaining IV access   Initiate Carrier Fluid Protocol   Cardiac monitoring   pharmacy consult   Consult to hospitalist   Consult to infectious diseases   Consult to hospitalist   Airborne and Contact precautions   Pulse oximetry, continuous   ED EKG 12-Lead   Insert peripheral IV X 1   Place in observation (patient's expected length of stay will be less than 2 midnights)     Following Medications were ordered in ER: Medications  sodium chloride 0.9 % bolus 1,000 mL (1,000 mLs Intravenous New Bag/Given 10/09/20 1554)        Consult Orders  (From admission, onward)           Start     Ordered   10/09/20 1702  Consult to hospitalist  Once       Provider:  (Not yet assigned)  Question Answer Comment  Place call to: [facility]   Reason for Consult Admit      10/09/20  1701   10/09/20 1628  Consult to hospitalist  Once       Provider:  (Not yet assigned)  Question Answer Comment  Place call to: [facility]   Reason for Consult Admit      10/09/20 1627            OTHER Significant initial  Findings:  labs showing:  Recent Labs  Lab 10/09/20 1452  NA 148*  K 3.9  CO2 20*  GLUCOSE 125*  BUN 43*  CREATININE 1.76*  CALCIUM 9.8    Cr    Up from baseline see below Lab Results  Component Value Date   CREATININE 1.76 (H) 10/09/2020   CREATININE 1.04 06/17/2020   CREATININE 1.10 05/27/2020    Recent Labs  Lab 10/09/20 1452  AST 30  ALT 35  ALKPHOS 37*  BILITOT 0.9  PROT 6.7  ALBUMIN 3.3*   Lab Results  Component Value Date   CALCIUM 9.8 10/09/2020   PHOS 1.9 (L) 05/27/2020        Plt: Lab Results  Component Value Date   PLT 147 (L) 10/09/2020    COVID-19 Labs  Recent Labs    10/09/20 1452  CRP 1.4*    Lab Results  Component Value Date   SARSCOV2NAA NEGATIVE 05/27/2020   SARSCOV2NAA NEGATIVE 05/12/2020   [place] NEGATIVE 03/26/2020   SARSCOV2NAA Not Detected 12/13/2018     Recent Labs  Lab 10/09/20 1452  WBC 10.3  NEUTROABS 7.5  HGB 10.5*  HCT 32.9*  MCV 104.4*  PLT 147*    HG/HCT  stable,      Component Value Date/Time   HGB 10.5 (L) 10/09/2020 1452   HGB 11.6 (L) 06/19/2008 1117   HCT 32.9 (L) 10/09/2020 1452   HCT 32.2 (L) 06/19/2008 1117   MCV 104.4 (H) 10/09/2020 1452   MCV 121.6 (H) 06/19/2008 1117    No results for input(s): AMMONIA in the last 168 hours.   Cardiac Panel (last 3 results) No results for input(s): CKTOTAL, CKMB, TROPONINI, RELINDX in the last 72 hours.    DM  labs:  HbA1C: Recent Labs    05/18/20 1036  HGBA1C 5.5       CBG (last 3)  No results for input(s): GLUCAP in the last 72 hours.        Cultures:    Component Value Date/Time   SDES  05/18/2020 1036    BLOOD LEFT HAND Performed at [facility], [place] [address],  [place], [place] [address]    SDES  05/18/2020 1036    BLOOD LEFT HAND Performed at [facility], [place] [address], [place], [place] [address]    [place]  05/18/2020 1036    BOTTLES DRAWN AEROBIC ONLY Blood Culture adequate volume Performed at [place] [address], [place], [place] [address]    SPECREQUEST  05/18/2020 1036    BOTTLES DRAWN AEROBIC ONLY Blood Culture results may not be optimal due to an inadequate volume of blood received in culture bottles Performed at [facility], [place] [address] [place], [place] [address]    CULT  05/18/2020 1036    NO GROWTH 5 DAYS Performed at [place] [address] [place], [place] [address]    CULT  05/18/2020 1036    NO GROWTH 5 DAYS Performed at [facility], [place] [address], [place], [place] [address]    REPTSTATUS 05/23/2020 FINAL 05/18/2020 1036   REPTSTATUS 05/23/2020 FINAL 05/18/2020 1036     Radiological Exams on Admission: CT Head Wo Contrast  Result Date: 10/09/2020 CLINICAL DATA:  Altered mental status EXAM: CT HEAD WITHOUT CONTRAST TECHNIQUE: Contiguous axial images were obtained from the base of the skull through the vertex without intravenous contrast. COMPARISON:  CT head dated 05/13/2020. FINDINGS: Motion artifact limits the sensitivity of the exam. Brain: No evidence of acute infarction, hemorrhage, hydrocephalus, extra-axial collection or mass lesion/mass effect. There is mild cerebral volume loss with associated ex vacuo dilatation. Periventricular white matter hypoattenuation likely represents chronic small vessel ischemic disease. Vascular: No hyperdense vessel or unexpected calcification. Skull: Normal. Negative for fracture or focal lesion. Sinuses/Orbits: No acute finding. Other: None. IMPRESSION: No acute intracranial process. Electronically Signed   By: Zerita Boers M.D.   On: 10/09/2020 17:57   DG Chest Port 1 View  Result Date:  10/09/2020 CLINICAL DATA:  Questionable sepsis. EXAM: PORTABLE CHEST 1 VIEW COMPARISON:  Chest x-ray 05/17/2020. FINDINGS: Right-sided central venous catheter tip projects over the mid SVC. The cardiomediastinal silhouette is stable and within normal limits. There is no lung consolidation, pleural effusion or pneumothorax. There are minimal atelectatic changes in the lung bases. IMPRESSION: No active disease. Electronically Signed   By: Ronney Asters M.D.   On: 10/09/2020 16:21   _______________________________________________________________________________________________________ Latest  Blood pressure (!) 124/91, pulse 75, temperature 98.4 F (36.9 C), temperature source Oral, resp. rate (!) 22, height 6\' 2"  (1.88 m), weight 72 kg, SpO2 99 %.   Review of Systems:  Pertinent positives include:  fatigue,confusion  Constitutional:  No weight loss, night sweats, Fevers, chills,  weight loss  HEENT:  No headaches, Difficulty swallowing,Tooth/dental problems,Sore throat,  No sneezing, itching, ear ache, nasal congestion, post nasal drip,  Cardio-vascular:  No chest pain, Orthopnea, PND, anasarca, dizziness, palpitations.no Bilateral lower extremity swelling  GI:  No heartburn, indigestion, abdominal pain, nausea, vomiting, diarrhea, change in bowel habits, loss of appetite, melena, blood in stool, hematemesis Resp:  no shortness of breath at rest. No dyspnea on exertion, No excess mucus, no productive cough, No non-productive cough, No coughing up of blood.No change in color of mucus.No wheezing. Skin:  no rash or lesions. No jaundice GU:  no dysuria, change in color of urine, no urgency or frequency. No straining to urinate.  No flank pain.  Musculoskeletal:  No joint pain or no joint swelling. No decreased range of motion. No back pain.  Psych:  No change in mood or affect. No depression or anxiety. No memory loss.  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    HIV (human immunodeficiency virus infection) ([place])    Hx of sepsis 2015   affected rt hip / femur   Pain in limb 07/23/2013   PONV (postoperative nausea and vomiting)    Hiccups for 4 days after anesthesia   Radiation    Rectal cancer (HCC)    Squamous cell   Shingles    left shoulder, right leg      Past Surgical History:  Procedure Laterality Date   COLONOSCOPY     CONVERSION TO TOTAL HIP Right 06/15/2014   Procedure: REMOVAL OF ANTIBIOTIC  SPACER AND CONVERSION TO RIGHT TOTAL HIP  ARTHROPLASTY ;  Surgeon: Paralee Cancel, MD;  Location: [facility];  Service: Orthopedics;  Laterality: Right;   CYSTOSCOPY     HEMATOMA EVACUATION     HERNIA REPAIR     right X2   INCISION AND DRAINAGE HIP Right 07/31/2013   Procedure: IRRIGATION AND DEBRIDEMENT HIP WITH PERCUTANEOUS DRAIN PLACEMENT;  Surgeon: [name], MD;  Location: [facility];  Service: Orthopedics;  Laterality: Right;   INCISION AND DRAINAGE HIP Right 03/15/2015   Procedure: IRRIGATION AND DEBRIDEMENT RIGHT HIP WITH  HEAD BALL  AND POLY LINER EXCHANGE;  Surgeon: Paralee Cancel, MD;  Location: [facility];  Service: Orthopedics;  Laterality: Right;   INCISION AND DRAINAGE OF WOUND N/A 08/25/2013   Procedure: IRRIGATION AND DEBRIDEMENT OF SACRAL ULCER WITH PLACEMENT OF A CELL ;  Surgeon: Theodoro Kos, DO;  Location: [facility];  Service: Plastics;  Laterality: N/A;   INCISION AND DRAINAGE OF WOUND N/A  09/01/2013   Procedure: IRRIGATION AND DEBRIDEMENT SACRAL ULCER WITH PLACEMENT OF A CELL;  Surgeon: Theodoro Kos, DO;  Location: [facility];  Service: Plastics;  Laterality: N/A;   INCISION AND DRAINAGE OF WOUND N/A 09/08/2013   Procedure: IRRIGATION AND DEBRIDEMENT OF SACRAL ULCER WITH  PLACEMENT OF A CELL AND VAC;  Surgeon: Theodoro Kos, DO;  Location: [facility];  Service: Plastics;  Laterality: N/A;   INCISION AND DRAINAGE OF WOUND N/A 09/18/2013   Procedure: IRRIGATION AND DEBRIDEMENT OF SACRAL WOUND WITH PLACEMENT OF A-CELL;  Surgeon: Theodoro Kos, DO;  Location: [facility];  Service: Plastics;  Laterality: N/A;   PILONIDAL CYST DRAINAGE N/A 08/09/2013   Procedure: IRRIGATION AND DEBRIDEMENT SACRAL DECUBITUS;  Surgeon: Harl Bowie, MD;  Location: [facility];  Service: General;  Laterality: N/A;   RECTAL BIOPSY N/A 03/29/2020   Procedure: EXCISION OF PERIANAL TISSUE WITH BX AND 3 PUNCH BXS;  Surgeon: Ileana Roup, MD;  Location[facility];  Service: General;  Laterality: N/A;   TOTAL HIP ARTHROPLASTY Right 08/04/2013   Procedure: RESECTION HIP JOINT - PLACEMENT OF CEMENT PROSTHESIS;  Surgeon: [name], MD;  Location: [facility];  Service: Orthopedics;  Laterality: Right;   TRANSURETHRAL RESECTION OF PROSTATE  JAN 2016    Social History:  bed bound     reports that he has quit smoking. His smoking use included cigarettes. He smoked an average of .3 packs per day. He has never used smokeless tobacco. He reports current alcohol use of about 3.0 standard drinks per week. He reports current drug use. Drug: Marijuana.     Family History:   Family History  Problem Relation Age of Onset   Diabetes Mother    Dementia Mother    Atrial fibrillation Mother    Hypertension Mother    Cancer Father    Emphysema Father    ______________________________________________________________________________________________ Allergies: Allergies  Allergen Reactions   Collagen Rash    redness     Prior to  Admission medications   Medication Sig Start Date End Date Taking? Authorizing Provider  acetaminophen (TYLENOL) 325 MG tablet Take 650 mg by mouth every 6 (six) hours as needed for mild pain or fever.   Yes [provider]  Amino Acids-Protein Hydrolys (FEEDING SUPPLEMENT, PRO-STAT SUGAR FREE 64,) LIQD Take 30 mLs by mouth in the morning and at bedtime.   Yes [provider]  Baclofen 5 MG TABS Take 5 mg by mouth in the morning, at noon, and at bedtime. For 7 days 10/05/20 10/12/20 Yes [provider]  Banana Flakes (BANATROL PLUS) PACK Take 1 packet by mouth in the morning and at bedtime.   Yes [provider]  darunavir-cobicistat (PREZCOBIX) 800-150 MG tablet TAKE 1 TABLET BY MOUTH DAILY . SWALLOW WHOLE, DO NOT CRUSH, BREAK, OR CHEW TABLETS. TAKE WITH FOOD Patient taking differently: Take 1 tablet by mouth daily. 08/19/20  Yes [name], MD  diphenoxylate-atropine (LOMOTIL) 2.5-0.025 MG tablet Take 2 tablets by mouth 4 (four) times daily.   Yes [provider]  Ensure (ENSURE) Take 237 mLs by mouth in the morning and at bedtime.   Yes [provider]  loperamide (IMODIUM A-D) 2 MG tablet Take 2-4 mg by mouth as needed for diarrhea or loose stools (max dose of 16mg /24 hours). 4mg  after 1st loose stool and 2mg  after each subsequent BM   Yes [provider]  megestrol (MEGACE ES) 625 MG/5ML suspension Take 5 mLs (625 mg total) by mouth daily. 07/20/20  Yes [name], MD  metoCLOPramide (REGLAN) 5 MG tablet Take 10 mg by mouth in the morning, at noon, in the evening, and at bedtime. 07/15/20  Yes [provider]  oxyCODONE (OXY IR/ROXICODONE) 5 MG immediate release tablet Take 1 tablet (5  mg total) by mouth every 6 (six) hours as needed for severe pain. Patient taking differently: Take 5 mg by mouth See admin instructions. 5mg  three times daily scheduled, and an additional 5mg  every 6 hours as needed for severe pain  05/28/20  Yes Arrien, Jimmy Picket, MD  Piperacillin Sod-Tazobactam So 3-0.375 g SOLR Inject 3.375 g into the vein every 6 (six) hours. For 6 weeks 09/16/20 10/28/20 Yes [provider]  rilpivirine (EDURANT) 25 MG TABS tablet TAKE 1 TABLET (25 MG TOTAL) BY MOUTH DAILY WITH BREAKFAST Patient taking differently: Take 25 mg by mouth daily. 08/19/20  Yes [name], MD  TIVICAY 50 MG tablet TAKE 1 TABLET (50 MG TOTAL) BY MOUTH DAILY Patient taking differently: Take 50 mg by mouth daily. 08/19/20  Yes [name], MD  vitamin C (ASCORBIC ACID) 500 MG tablet Take 1,000 mg by mouth daily.   Yes [provider]    ___________________________________________________________________________________________________ Physical Exam: Vitals with BMI 10/09/2020 10/09/2020 10/09/2020  Height - - -  Weight - - -  BMI - - -  Systolic 366 440 347  Diastolic 91 97 82  Pulse 75 96 70     1. General:  in No  Acute distress    Chronically ill   -appearing 2. Psychological: Alert and  Oriented  to self 3. Head/ENT:    Dry Mucous Membranes                          Head Non traumatic, neck supple                           Poor Dentition, thrush 4. SKIN:  decreased Skin turgor,  Skin clean Dry sacral wound 5. Heart: Regular rate and rhythm no  Murmur, no Rub or gallop 6. Lungs:   no wheezes or crackles   7. Abdomen: Soft,  non-tender, Non distended  bowel sounds present 8. Lower extremities: no clubbing, cyanosis, no  edema 9. Neurologically Grossly intact, moving all 4 extremities equally cog rigidity through out 10. MSK: Normal range of motion    Chart has been reviewed _______________________   Assessment/Plan 60 y.o. 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   Admitted for acute encephalopathy and rigidity  Present on Admission:  Acute encephalopathy -most likely in the setting of toxicity from Reglan.  We will hold Reglan Given past history of septic emboli to brain obtain MRI. Discussed with neurology.  Will see in consult. If rigidity continues despite stopping Reglan can give Dantroline 1mg /kg IV If NMS picture develops can administer Amentadine/ bromocriptine Monitor in stepdown check CK level  Rigidity most likely secondary to Reglan toxicity.  Will DC Reglan avoid sudden baclofen withdrawal.  Monitor in stepdown Dantroline 1mg /kg IV if no improvement  THRuSH - on nystatin  Hypernatremia.-After administration of thiamine switch to D5 half-normal and monitor sodium levels frequent BMT avoid rapid correction and continue to rehydrate   Unspecified severe protein-calorie malnutrition ([place]) -nutritional consult   Human immunodeficiency virus (HIV) disease ([place]) -continue home medications appreciate ID consult check HIV viral load and CD4   Dehydration -rehydrate   AKI (acute kidney injury) ([place]) -recent dehydration decreased p.o. intake secondary to UTI also  will check CK Rehydrate and follow renal function   Sacral osteomyelitis ([place]) -continue Vanco Zosyn for now but would be aware of renal insufficiency.  Will need to discuss with ID if further alternative if renal function worsens or does not improve   Chronic diastolic CHF (congestive heart failure) ([place]) current appears to be in a dry side will rehydrate   Anemia hemoglobin up from baseline likely secondary to hemoconcentration.  Obtain anemia panel expect hemoglobin to go down tomorrow.  Continue to follow  Decubitus ulcer we will order wound care consult Other plan as per orders.  Functional quadriplegia -PT OT assessment patient will need to be back to SNF after discharge  History of dysphagia we will have SLP assess given worsening GERD ability to  swallow may be affected    DVT prophylaxis:  SCD     Code Status: DNR/DNI   as per patient  family  I had personally discussed CODE STATUS with patient and family     Family Communication:   Family at  Bedside  plan of care was discussed  with  Sister   Disposition Plan:    Back to current facility when stable                              Following barriers for discharge:                            Electrolytes corrected                               Anemia stable                               Will need to be able to tolerate PO                                              Will need consultants to evaluate patient prior to discharge    Would benefit from PT/OT eval prior to DC  Ordered                   Swallow eval - SLP ordered                                       Transition of care consulted                   Nutrition    consulted                  Wound care  consulted                                       Consults called: ID consulted, Neurology consulted  Admission status:  ED Disposition     ED Disposition  [place]: [place] [[id number]]  Level of Care: Telemetry [5]  Admit to tele based on following criteria: Other  see comments  Comments: acute ecephalopathy  May place patient in observation at [facility] or [facility] if equivalent level of care is available:: No  Covid Evaluation: Asymptomatic Screening Protocol (No Symptoms)  Diagnosis: Acute encephalopathy [228406]  Admitting Physician: Toy Baker [[id number]]  Attending Physician: Toy Baker [[id number]]          Obs    Level of care   tele  For  24H     Lab Results  Component Value Date   [place] 10/09/2020     Precautions: admitted as   Covid Negative    PPE: Used by the provider:   N95  eye Goggles,  Gloves     Catelynn Sparger 10/09/2020, 9:29 PM    [facility]     after 2 AM please page  floor coverage PA If 7AM-7PM, please contact the day team taking care of the patient using [email or web address]   Patient was evaluated in the context of the global COVID-19 pandemic, which necessitated consideration that the patient might be at risk for infection with the SARS-CoV-2 virus that causes COVID-19. Institutional protocols and algorithms that pertain to the evaluation of patients at risk for COVID-19 are in a state of rapid change based on information released by regulatory bodies including the CDC and federal and state organizations. These policies and algorithms were followed during the patient's care.

## 2020-10-09 NOTE — Plan of Care (Signed)
Discussed with patient plan of care for the evening, pain management and admission question with some teach back displayed at this time.  Problem: Education: Goal: Knowledge of General Education information will improve Description: Including pain rating scale, medication(s)/side effects and non-pharmacologic comfort measures Outcome: Progressing

## 2020-10-09 NOTE — ED Provider Notes (Addendum)
[facility] DEPT Provider Note   CSN: [id number] Arrival date & time: 10/09/20  1437     History No chief complaint on file.   [name] [name] is a 60 y.o. male.  Patient has complex medical history including HIV, sacral decubitus ulcer with osteomyelitis, bacteremia, rectal cancer.  Currently is on antibiotics for a PICC line.  Last saw ID about a week ago.  Here from his facility for increasing confusion.  Patient unable to tell me why he is here.  Level 5 caveat secondary to altered mental status.  He denies any complaints.  He is not a good historian.  Per EMS patient had recent dental extraction and has declined in health.  The history is provided by the patient and the EMS personnel.  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    HIV (human immunodeficiency virus infection) ([place])    Hx of sepsis 2015   affected rt hip / femur   Pain in limb 07/23/2013   PONV (postoperative nausea and vomiting)    Hiccups for 4 days after anesthesia   Radiation    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   Shingles    left shoulder, right leg    Patient Active Problem List   Diagnosis Date Noted   Onychomycosis 08/19/2020   Penile ulcer 08/19/2020   Malnutrition of moderate degree 05/14/2020   AKI (acute kidney injury) ([place]) 05/13/2020   Lactic acidosis 05/13/2020   SDH (subdural hematoma) 05/13/2020   Hypothermia due to exposure 05/13/2020    Septic shock ([place]) 05/13/2020   Cellulitis 05/13/2020   Elevated brain natriuretic peptide (BNP) level 05/13/2020   Volume overload 05/13/2020   Dehydration 05/13/2020   Decubitus ulcer 05/13/2020   Subdural empyema    Streptococcal bacteremia    Rectal bleeding 02/20/2020   Abnormal digital rectal exam 02/20/2020   History of anal cancer 02/20/2020   Poor dentition 01/16/2017   Renal failure, chronic 06/10/2014   Septic arthritis of hip ([place]) 07/31/2013   Decubitus ulcer of sacral region, stage 2 ([place]) 07/31/2013   Protein-calorie malnutrition, severe ([place]) 07/31/2013   Pain in limb 07/23/2013   Inguinal adenopathy 02/18/2013   Enlargement of lymph nodes 01/13/2013   Urethral stricture 09/05/2007   ANEMIA, CHRONIC 03/20/2007   Unspecified severe protein-calorie malnutrition ([place]) 02/14/2007   DVT 08/22/2006   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   ABSCESS, RECTUM 04/11/2006   Thrush of mouth and esophagus ([place]) 02/16/2006   DIARRHEA 02/16/2006   Human immunodeficiency virus (HIV) disease ([place]) 10/19/2005   MOLLUSCUM CONTAGIOSUM 10/19/2005   GOUT 10/19/2005   HEPATITIS B, HX OF 10/19/2005   HERPES ZOSTER, HX OF 10/19/2005    Past Surgical History:  Procedure Laterality Date   COLONOSCOPY     CONVERSION TO TOTAL HIP Right 06/15/2014   Procedure:  REMOVAL OF ANTIBIOTIC  SPACER AND CONVERSION TO RIGHT TOTAL HIP  ARTHROPLASTY ;  Surgeon: Matthew Olin, MD;  Location: [facility];  Service: Orthopedics;  Laterality: Right;   CYSTOSCOPY     HEMATOMA EVACUATION     HERNIA REPAIR     right X2   INCISION AND DRAINAGE HIP Right 07/31/2013   Procedure: IRRIGATION AND DEBRIDEMENT HIP WITH PERCUTANEOUS DRAIN PLACEMENT;  Surgeon: Steven R Norris, MD;  Location: [facility];  Service: Orthopedics;  Laterality: Right;   INCISION AND DRAINAGE HIP Right 03/15/2015   Procedure: IRRIGATION AND DEBRIDEMENT RIGHT HIP WITH  HEAD BALL  AND POLY LINER EXCHANGE;  Surgeon: Matthew Olin, MD;  Location: [facility];  Service: Orthopedics;  Laterality: Right;   INCISION AND DRAINAGE OF WOUND N/A 08/25/2013   Procedure: IRRIGATION AND DEBRIDEMENT OF SACRAL ULCER WITH PLACEMENT OF A CELL ;  Surgeon: Claire Sanger, DO;  Location: [facility];  Service: Plastics;  Laterality: N/A;   INCISION AND DRAINAGE OF WOUND N/A 09/01/2013   Procedure: IRRIGATION AND DEBRIDEMENT SACRAL ULCER WITH PLACEMENT OF A CELL;  Surgeon: Claire Sanger, DO;  Location: [facility];  Service: Plastics;  Laterality: N/A;   INCISION AND DRAINAGE OF WOUND N/A 09/08/2013   Procedure: IRRIGATION AND DEBRIDEMENT OF SACRAL ULCER WITH PLACEMENT OF A CELL AND VAC;  Surgeon: Claire Sanger, DO;  Location: [facility];  Service: Plastics;  Laterality: N/A;   INCISION AND DRAINAGE OF WOUND N/A 09/18/2013   Procedure: IRRIGATION AND DEBRIDEMENT OF SACRAL WOUND WITH PLACEMENT OF A-CELL;  Surgeon: Claire Sanger, DO;  Location: [facility];  Service: Plastics;  Laterality: N/A;   PILONIDAL CYST DRAINAGE N/A 08/09/2013   Procedure: IRRIGATION AND DEBRIDEMENT SACRAL DECUBITUS;  Surgeon: Douglas A Blackman, MD;  Location: [facility];  Service: General;  Laterality: N/A;   RECTAL BIOPSY N/A 03/29/2020   Procedure: EXCISION OF PERIANAL TISSUE WITH BX AND 3 PUNCH BXS;  Surgeon: White, Christopher M, MD;  Location: [facility];  Service: General;  Laterality: N/A;   TOTAL HIP ARTHROPLASTY Right 08/04/2013   Procedure: RESECTION HIP JOINT - PLACEMENT OF CEMENT PROSTHESIS;  Surgeon: Matthew D Olin, MD;  Location: [facility];  Service: Orthopedics;  Laterality: Right;   TRANSURETHRAL RESECTION OF PROSTATE  JAN 2016       Family History  Problem Relation Age of Onset   Diabetes Mother    Dementia Mother    Atrial fibrillation Mother    Hypertension Mother    Cancer Father    Emphysema Father     Social History   Tobacco Use   Smoking status: Former    Packs/day: 0.30    Types: Cigarettes   Smokeless tobacco: Never  Vaping Use   Vaping Use: Never used  Substance Use Topics   Alcohol use:  Yes    Alcohol/week: 3.0 standard drinks    Types: 3 Standard drinks or equivalent per week   Drug use: Yes    Types: Marijuana    Comment: occ.    Home Medications Prior to Admission medications   Medication Sig Start Date End Date Taking? Authorizing Provider  acetaminophen (TYLENOL) 325 MG tablet Take 650 mg by mouth every 6 (six) hours as needed for mild pain or fever.   Yes [provider]  Amino Acids-Protein Hydrolys (FEEDING SUPPLEMENT, PRO-STAT SUGAR FREE 64,) LIQD Take 30 mLs by mouth in the morning and at bedtime.   Yes [provider]  Baclofen 5 MG TABS Take 5 mg by mouth   in the morning, at noon, and at bedtime. For 7 days 10/05/20 10/12/20 Yes [provider]  Banana Flakes (BANATROL PLUS) PACK Take 1 packet by mouth in the morning and at bedtime.   Yes [provider]  darunavir-cobicistat (PREZCOBIX) 800-150 MG tablet TAKE 1 TABLET BY MOUTH DAILY . SWALLOW WHOLE, DO NOT CRUSH, BREAK, OR CHEW TABLETS. TAKE WITH FOOD Patient taking differently: Take 1 tablet by mouth daily. 08/19/20  Yes Hatcher, Jeffrey C, MD  diphenoxylate-atropine (LOMOTIL) 2.5-0.025 MG tablet Take 2 tablets by mouth 4 (four) times daily.   Yes [provider]  Ensure (ENSURE) Take 237 mLs by mouth in the morning and at bedtime.   Yes [provider]  loperamide (IMODIUM A-D) 2 MG tablet Take 2-4 mg by mouth as needed for diarrhea or loose stools (max dose of 16mg/24 hours). 4mg after 1st loose stool and 2mg after each subsequent BM   Yes [provider]  megestrol (MEGACE ES) 625 MG/5ML suspension Take 5 mLs (625 mg total) by mouth daily. 07/20/20  Yes Hatcher, Jeffrey C, MD  metoCLOPramide (REGLAN) 5 MG tablet Take 10 mg by mouth in the morning, at noon, in the evening, and at bedtime. 07/15/20  Yes [provider]  oxyCODONE (OXY IR/ROXICODONE) 5 MG immediate release tablet Take 1 tablet (5 mg total) by mouth every 6 (six) hours as needed for  severe pain. Patient taking differently: Take 5 mg by mouth See admin instructions. 5mg three times daily scheduled, and an additional 5mg every 6 hours as needed for severe pain 05/28/20  Yes Arrien, Mauricio Daniel, MD  Piperacillin Sod-Tazobactam So 3-0.375 g SOLR Inject 3.375 g into the vein every 6 (six) hours. For 6 weeks 09/16/20 10/28/20 Yes [provider]  rilpivirine (EDURANT) 25 MG TABS tablet TAKE 1 TABLET (25 MG TOTAL) BY MOUTH DAILY WITH BREAKFAST Patient taking differently: Take 25 mg by mouth daily. 08/19/20  Yes Hatcher, Jeffrey C, MD  TIVICAY 50 MG tablet TAKE 1 TABLET (50 MG TOTAL) BY MOUTH DAILY Patient taking differently: Take 50 mg by mouth daily. 08/19/20  Yes Hatcher, Jeffrey C, MD  vitamin C (ASCORBIC ACID) 500 MG tablet Take 1,000 mg by mouth daily.   Yes [provider]    Allergies    Collagen  Review of Systems   Review of Systems  Unable to perform ROS: Mental status change  Psychiatric/Behavioral:  Positive for confusion.    Physical Exam Updated Vital Signs BP 125/82   Pulse 78   Temp 98.4 F (36.9 C) (Oral)   Resp 13   Ht 6' 2" (1.88 m)   Wt 72 kg   SpO2 98%   BMI 20.38 kg/m   Physical Exam Vitals and nursing note reviewed.  Constitutional:      Appearance: Normal appearance. He is well-developed.  HENT:     Head: Normocephalic and atraumatic.     Mouth/Throat:     Comments: Multiple dental extractions and poor dentition.  No obvious abscess or drainage.  No trismus Eyes:     Conjunctiva/sclera: Conjunctivae normal.  Cardiovascular:     Rate and Rhythm: Normal rate and regular rhythm.     Pulses: Normal pulses.     Heart sounds: Murmur heard.  Pulmonary:     Effort: Pulmonary effort is normal. No respiratory distress.     Breath sounds: Normal breath sounds.  Abdominal:     Palpations: Abdomen is soft.     Tenderness: There is no abdominal   tenderness. There is no guarding or rebound.  Musculoskeletal:        General:  No deformity. Normal range of motion.     Cervical back: Neck supple.     Right lower leg: No edema.     Left lower leg: No edema.     Comments: Is a few wounds on his heels bilaterally.  He is a large sacral decubitus with surrounding erythema.  No purulent drainage.  He also has some erythema over his thoracic spinous processes.  Skin:    General: Skin is warm and dry.  Neurological:     General: No focal deficit present.     Mental Status: He is alert. He is disoriented.    ED Results / Procedures / Treatments   Labs (all labs ordered are listed, but only abnormal results are displayed) Labs Reviewed  MRSA NEXT GEN BY PCR, NASAL - Abnormal; Notable for the following components:      Result Value   MRSA by PCR Next Gen DETECTED (*)    All other components within normal limits  COMPREHENSIVE METABOLIC PANEL - Abnormal; Notable for the following components:   Sodium 148 (*)    Chloride 116 (*)    CO2 20 (*)    Glucose, Bld 125 (*)    BUN 43 (*)    Creatinine, Ser 1.76 (*)    Albumin 3.3 (*)    Alkaline Phosphatase 37 (*)    GFR, Estimated 44 (*)    All other components within normal limits  CBC WITH DIFFERENTIAL/PLATELET - Abnormal; Notable for the following components:   RBC 3.15 (*)    Hemoglobin 10.5 (*)    HCT 32.9 (*)    MCV 104.4 (*)    RDW 15.7 (*)    Platelets 147 (*)    nRBC 1.0 (*)    Abs Immature Granulocytes 0.58 (*)    All other components within normal limits  C-REACTIVE PROTEIN - Abnormal; Notable for the following components:   CRP 1.4 (*)    All other components within normal limits  SEDIMENTATION RATE - Abnormal; Notable for the following components:   Sed Rate 50 (*)    All other components within normal limits  VITAMIN B12 - Abnormal; Notable for the following components:   Vitamin B-12 111 (*)    All other components within normal limits  RETICULOCYTES - Abnormal; Notable for the following components:   Retic Ct Pct 4.2 (*)    RBC. 2.95 (*)     Immature Retic Fract 32.6 (*)    All other components within normal limits  BLOOD GAS, VENOUS - Abnormal; Notable for the following components:   pCO2, Ven 38.4 (*)    pO2, Ven 20.8 (*)    Bicarbonate 19.6 (*)    Acid-base deficit 5.5 (*)    All other components within normal limits  RAPID URINE DRUG SCREEN, HOSP PERFORMED - Abnormal; Notable for the following components:   Opiates POSITIVE (*)    All other components within normal limits  OSMOLALITY - Abnormal; Notable for the following components:   Osmolality 316 (*)    All other components within normal limits  MAGNESIUM - Abnormal; Notable for the following components:   Magnesium 2.5 (*)    All other components within normal limits  CBC WITH DIFFERENTIAL/PLATELET - Abnormal; Notable for the following components:   RBC 2.65 (*)    Hemoglobin 8.9 (*)    HCT 27.6 (*)    MCV 104.2 (*)      RDW 15.8 (*)    Platelets 132 (*)    nRBC 1.2 (*)    Abs Immature Granulocytes 0.45 (*)    All other components within normal limits  COMPREHENSIVE METABOLIC PANEL - Abnormal; Notable for the following components:   Sodium 147 (*)    Chloride 118 (*)    CO2 21 (*)    Glucose, Bld 100 (*)    BUN 42 (*)    Creatinine, Ser 1.70 (*)    Total Protein 5.8 (*)    Albumin 2.8 (*)    Alkaline Phosphatase 32 (*)    GFR, Estimated 46 (*)    All other components within normal limits  BASIC METABOLIC PANEL - Abnormal; Notable for the following components:   Sodium 147 (*)    Chloride 117 (*)    CO2 19 (*)    BUN 38 (*)    Creatinine, Ser 1.71 (*)    GFR, Estimated 46 (*)    All other components within normal limits  CULTURE, BLOOD (ROUTINE X 2)  RESP PANEL BY RT-PCR (FLU A&B, COVID) ARPGX2  URINE CULTURE  CULTURE, BLOOD (ROUTINE X 2)  LACTIC ACID, PLASMA  PROTIME-INR  URINALYSIS, ROUTINE W REFLEX MICROSCOPIC  AMMONIA  MAGNESIUM  PHOSPHORUS  CK  FOLATE  IRON AND TIBC  FERRITIN  CREATININE, URINE, RANDOM  SODIUM, URINE, RANDOM   OSMOLALITY, URINE  PROCALCITONIN  PROCALCITONIN  CK  PHOSPHORUS  TSH  VANCOMYCIN, RANDOM  T-HELPER CELLS (CD4) COUNT (NOT AT [facility])  HIV-1 RNA QUANT-NO REFLEX-BLD  BASIC METABOLIC PANEL    EKG None  Radiology CT Head Wo Contrast  Result Date: 10/09/2020 CLINICAL DATA:  Altered mental status EXAM: CT HEAD WITHOUT CONTRAST TECHNIQUE: Contiguous axial images were obtained from the base of the skull through the vertex without intravenous contrast. COMPARISON:  CT head dated 05/13/2020. FINDINGS: Motion artifact limits the sensitivity of the exam. Brain: No evidence of acute infarction, hemorrhage, hydrocephalus, extra-axial collection or mass lesion/mass effect. There is mild cerebral volume loss with associated ex vacuo dilatation. Periventricular white matter hypoattenuation likely represents chronic small vessel ischemic disease. Vascular: No hyperdense vessel or unexpected calcification. Skull: Normal. Negative for fracture or focal lesion. Sinuses/Orbits: No acute finding. Other: None. IMPRESSION: No acute intracranial process. Electronically Signed   By: Zerita Boers M.D.   On: 10/09/2020 17:57   DG Chest Port 1 View  Result Date: 10/09/2020 CLINICAL DATA:  Questionable sepsis. EXAM: PORTABLE CHEST 1 VIEW COMPARISON:  Chest x-ray 05/17/2020. FINDINGS: Right-sided central venous catheter tip projects over the mid SVC. The cardiomediastinal silhouette is stable and within normal limits. There is no lung consolidation, pleural effusion or pneumothorax. There are minimal atelectatic changes in the lung bases. IMPRESSION: No active disease. Electronically Signed   By: Ronney Asters M.D.   On: 10/09/2020 16:21    Procedures .Critical Care Performed by: Hayden Rasmussen, MD Authorized by: Hayden Rasmussen, MD   Critical care provider statement:    Critical care time (minutes):  30   Critical care time was exclusive of:  Separately billable procedures and treating other patients    Critical care was necessary to treat or prevent imminent or life-threatening deterioration of the following conditions:  CNS failure or compromise   Critical care was time spent personally by me on the following activities:  Discussions with consultants, evaluation of patient's response to treatment, examination of patient, ordering and performing treatments and interventions, ordering and review of laboratory studies, ordering and review of  radiographic studies, pulse oximetry, re-evaluation of patient's condition, obtaining history from patient or surrogate, review of old charts and development of treatment plan with patient or surrogate   Medications Ordered in ED Medications  thiamine tablet 100 mg (has no administration in time range)  darunavir-cobicistat (PREZCOBIX) 800-150 MG per tablet 1 tablet (has no administration in time range)  rilpivirine (EDURANT) tablet 25 mg (25 mg Oral Given 10/10/20 0812)  dolutegravir (TIVICAY) tablet 50 mg (has no administration in time range)  piperacillin-tazobactam (ZOSYN) IVPB 3.375 g ( Intravenous Infusion Verify 10/10/20 0900)  baclofen (LIORESAL) tablet 5 mg (5 mg Oral Given 10/10/20 0059)  acetaminophen (TYLENOL) tablet 650 mg (has no administration in time range)    Or  acetaminophen (TYLENOL) suppository 650 mg (has no administration in time range)  dextrose 5 %-0.45 % sodium chloride infusion ( Intravenous Stopped 10/10/20 0621)  feeding supplement (ENSURE ENLIVE / ENSURE PLUS) liquid 237 mL (237 mLs Oral Given 10/09/20 2326)  Chlorhexidine Gluconate Cloth 2 % PADS 6 each (6 each Topical Given 10/09/20 2311)  chlorhexidine gluconate (MEDLINE KIT) (PERIDEX) 0.12 % solution 15 mL (15 mLs Mouth Rinse Given 10/09/20 2324)  MEDLINE mouth rinse (has no administration in time range)  megestrol (MEGACE) 400 MG/10ML suspension 800 mg (has no administration in time range)  nystatin (MYCOSTATIN) 100000 UNIT/ML suspension 500,000 Units (has no administration in time  range)  vancomycin (VANCOREADY) IVPB 750 mg/150 mL (has no administration in time range)  cyanocobalamin ((VITAMIN B-12)) injection 1,000 mcg (has no administration in time range)    Followed by  vitamin B-12 (CYANOCOBALAMIN) tablet 1,000 mcg (has no administration in time range)  mupirocin ointment (BACTROBAN) 2 % 1 application (has no administration in time range)  LORazepam (ATIVAN) injection 1 mg (has no administration in time range)  sodium chloride 0.9 % bolus 1,000 mL (0 mLs Intravenous Stopping Infusion hung by another clincian 10/09/20 2311)  thiamine 500mg in normal saline (50ml) IVPB (0 mg Intravenous Stopped 10/09/20 2359)  vancomycin (VANCOREADY) IVPB 1250 mg/250 mL (0 mg Intravenous Stopped 10/10/20 0422)  magnesium sulfate IVPB 1 g 100 mL (0 g Intravenous Stopped 10/10/20 0248)    ED Course  I have reviewed the triage vital signs and the nursing notes.  Pertinent labs & imaging results that were available during my care of the patient were reviewed by me and considered in my medical decision making (see chart for details).  Clinical Course as of 10/10/20 0945  Sat Oct 09, 2020  1600 Chest x-ray interpreted by me as atelectasis versus infiltrate right base.  Awaiting radiology reading. [MB]  1635 Patient's sister is here now who helps with his healthcare decisions.  She said in July he was actually at his best and was ambulating with a walker and had clear sensorium.  She is noticed his decline over the last few weeks and is concerned that he might be bacteremic again [MB]  1645 Discussed with [facility] Dr. Xu who asked for an ID consult prior to admission for imaging recommendations [MB]  1659 Discussed with Dr. Comer.  He did not feel the source was likely infectious as he has been on antibiotics for a significant period of time, does not have a white count or fever.  Recommended workup further sources of altered mental status. [MB]  1824 Discussed with Dr. Doutova from  [facility] who will evaluate the patient for admission. [MB]    Clinical Course User Index [MB] Syble Picco C, MD   MDM   Rules/Calculators/A&P                          This patient complains of confusion weakness sacral decubitus heel ulcers; this involves an extensive number of treatment Options and is a complaint that carries with it a high risk of complications and Morbidity. The differential includes sepsis, bacteremia, stroke, metabolic derangement, dehydration  I ordered, reviewed and interpreted labs, which included CBC with normal white count, hemoglobin low better than priors, chemistries with new elevated sodium low bicarb elevated creatinine from priors, sed rate elevated better than blood culture sent, COVID and flu negative, urinalysis without signs of infection I ordered medication IV fluids I ordered imaging studies which included chest x-ray and head CT and I independently    visualized and interpreted imaging which showed no acute findings Additional history obtained from patient's sister Previous records obtained and reviewed in epic including prior admission and ID consults I consulted [facility] Dr. Doutova, infectious disease specialist Dr. Comer and discussed lab and imaging findings  Critical Interventions: Complex patient with multiple sources of infection in the setting of HIV immune compromised that required significant review of prior records, testing and involvement of consultants. after the interventions stated above, I reevaluated the patient and found patient to still be confused and not appropriate for discharge.  He will need admission to the hospital for further infectious, toxic metabolic work-up.   Final Clinical Impression(s) / ED Diagnoses Final diagnoses:  Encephalopathy  Hypernatremia  AKI (acute kidney injury) (HCC)  Pressure injury of skin of sacral region, unspecified injury stage    Rx / DC Orders ED Discharge Orders      None        ,  C, MD 10/10/20 0944    ,  C, MD 10/10/20 0946  

## 2020-10-09 NOTE — ED Notes (Signed)
Pt arrived, soiled, mepeplex dressing in place.  Dressing removed unable to apply a new dressing due to unavailability.  Pts sacral area cleansed purwick applied, diaper in place.    Patient noted to have a sacral decubation and thoracic spine area decubatious.

## 2020-10-10 ENCOUNTER — Observation Stay (HOSPITAL_COMMUNITY): Payer: Medicare Other

## 2020-10-10 DIAGNOSIS — D649 Anemia, unspecified: Secondary | ICD-10-CM | POA: Diagnosis not present

## 2020-10-10 DIAGNOSIS — Z66 Do not resuscitate: Secondary | ICD-10-CM | POA: Diagnosis present

## 2020-10-10 DIAGNOSIS — M4628 Osteomyelitis of vertebra, sacral and sacrococcygeal region: Secondary | ICD-10-CM | POA: Diagnosis present

## 2020-10-10 DIAGNOSIS — R29898 Other symptoms and signs involving the musculoskeletal system: Secondary | ICD-10-CM | POA: Diagnosis not present

## 2020-10-10 DIAGNOSIS — B370 Candidal stomatitis: Secondary | ICD-10-CM | POA: Diagnosis present

## 2020-10-10 DIAGNOSIS — R532 Functional quadriplegia: Secondary | ICD-10-CM | POA: Diagnosis present

## 2020-10-10 DIAGNOSIS — R1310 Dysphagia, unspecified: Secondary | ICD-10-CM | POA: Diagnosis present

## 2020-10-10 DIAGNOSIS — N1831 Chronic kidney disease, stage 3a: Secondary | ICD-10-CM | POA: Diagnosis present

## 2020-10-10 DIAGNOSIS — G9341 Metabolic encephalopathy: Secondary | ICD-10-CM

## 2020-10-10 DIAGNOSIS — E870 Hyperosmolality and hypernatremia: Secondary | ICD-10-CM | POA: Diagnosis present

## 2020-10-10 DIAGNOSIS — L8990 Pressure ulcer of unspecified site, unspecified stage: Secondary | ICD-10-CM | POA: Insufficient documentation

## 2020-10-10 DIAGNOSIS — E519 Thiamine deficiency, unspecified: Secondary | ICD-10-CM | POA: Diagnosis present

## 2020-10-10 DIAGNOSIS — E876 Hypokalemia: Secondary | ICD-10-CM | POA: Diagnosis present

## 2020-10-10 DIAGNOSIS — D539 Nutritional anemia, unspecified: Secondary | ICD-10-CM | POA: Diagnosis present

## 2020-10-10 DIAGNOSIS — Z20822 Contact with and (suspected) exposure to covid-19: Secondary | ICD-10-CM | POA: Diagnosis present

## 2020-10-10 DIAGNOSIS — N179 Acute kidney failure, unspecified: Secondary | ICD-10-CM | POA: Diagnosis present

## 2020-10-10 DIAGNOSIS — G928 Other toxic encephalopathy: Secondary | ICD-10-CM | POA: Diagnosis present

## 2020-10-10 DIAGNOSIS — L89159 Pressure ulcer of sacral region, unspecified stage: Secondary | ICD-10-CM | POA: Diagnosis not present

## 2020-10-10 DIAGNOSIS — G2119 Other drug induced secondary parkinsonism: Secondary | ICD-10-CM | POA: Diagnosis present

## 2020-10-10 DIAGNOSIS — G9340 Encephalopathy, unspecified: Secondary | ICD-10-CM | POA: Diagnosis present

## 2020-10-10 DIAGNOSIS — B20 Human immunodeficiency virus [HIV] disease: Secondary | ICD-10-CM | POA: Diagnosis present

## 2020-10-10 DIAGNOSIS — L89152 Pressure ulcer of sacral region, stage 2: Secondary | ICD-10-CM | POA: Diagnosis present

## 2020-10-10 DIAGNOSIS — E43 Unspecified severe protein-calorie malnutrition: Secondary | ICD-10-CM | POA: Diagnosis present

## 2020-10-10 DIAGNOSIS — Z86718 Personal history of other venous thrombosis and embolism: Secondary | ICD-10-CM | POA: Diagnosis not present

## 2020-10-10 DIAGNOSIS — E872 Acidosis, unspecified: Secondary | ICD-10-CM | POA: Diagnosis present

## 2020-10-10 DIAGNOSIS — I130 Hypertensive heart and chronic kidney disease with heart failure and stage 1 through stage 4 chronic kidney disease, or unspecified chronic kidney disease: Secondary | ICD-10-CM | POA: Diagnosis present

## 2020-10-10 DIAGNOSIS — Z681 Body mass index (BMI) 19 or less, adult: Secondary | ICD-10-CM | POA: Diagnosis not present

## 2020-10-10 DIAGNOSIS — I5032 Chronic diastolic (congestive) heart failure: Secondary | ICD-10-CM | POA: Diagnosis present

## 2020-10-10 DIAGNOSIS — E860 Dehydration: Secondary | ICD-10-CM | POA: Diagnosis present

## 2020-10-10 DIAGNOSIS — Z85048 Personal history of other malignant neoplasm of rectum, rectosigmoid junction, and anus: Secondary | ICD-10-CM | POA: Diagnosis not present

## 2020-10-10 LAB — BLOOD GAS, VENOUS
Acid-base deficit: 5.5 mmol/L — ABNORMAL HIGH (ref 0.0–2.0)
Bicarbonate: 19.6 mmol/L — ABNORMAL LOW (ref 20.0–28.0)
O2 Saturation: 21.2 %
Patient temperature: 98.6
pCO2, Ven: 38.4 mmHg — ABNORMAL LOW (ref 44.0–60.0)
pH, Ven: 7.328 (ref 7.250–7.430)
pO2, Ven: 20.8 mmHg — CL (ref 32.0–45.0)

## 2020-10-10 LAB — MAGNESIUM
Magnesium: 1.8 mg/dL (ref 1.7–2.4)
Magnesium: 2.5 mg/dL — ABNORMAL HIGH (ref 1.7–2.4)

## 2020-10-10 LAB — CBC WITH DIFFERENTIAL/PLATELET
Abs Immature Granulocytes: 0.45 10*3/uL — ABNORMAL HIGH (ref 0.00–0.07)
Basophils Absolute: 0 10*3/uL (ref 0.0–0.1)
Basophils Relative: 0 %
Eosinophils Absolute: 0.1 10*3/uL (ref 0.0–0.5)
Eosinophils Relative: 2 %
HCT: 27.6 % — ABNORMAL LOW (ref 39.0–52.0)
Hemoglobin: 8.9 g/dL — ABNORMAL LOW (ref 13.0–17.0)
Immature Granulocytes: 6 %
Lymphocytes Relative: 16 %
Lymphs Abs: 1.2 10*3/uL (ref 0.7–4.0)
MCH: 33.6 pg (ref 26.0–34.0)
MCHC: 32.2 g/dL (ref 30.0–36.0)
MCV: 104.2 fL — ABNORMAL HIGH (ref 80.0–100.0)
Monocytes Absolute: 0.6 10*3/uL (ref 0.1–1.0)
Monocytes Relative: 8 %
Neutro Abs: 4.9 10*3/uL (ref 1.7–7.7)
Neutrophils Relative %: 68 %
Platelets: 132 10*3/uL — ABNORMAL LOW (ref 150–400)
RBC: 2.65 MIL/uL — ABNORMAL LOW (ref 4.22–5.81)
RDW: 15.8 % — ABNORMAL HIGH (ref 11.5–15.5)
WBC: 7.3 10*3/uL (ref 4.0–10.5)
nRBC: 1.2 % — ABNORMAL HIGH (ref 0.0–0.2)

## 2020-10-10 LAB — OSMOLALITY, URINE: Osmolality, Ur: 570 mOsm/kg (ref 300–900)

## 2020-10-10 LAB — VITAMIN B12: Vitamin B-12: 111 pg/mL — ABNORMAL LOW (ref 180–914)

## 2020-10-10 LAB — RETICULOCYTES
Immature Retic Fract: 32.6 % — ABNORMAL HIGH (ref 2.3–15.9)
RBC.: 2.95 MIL/uL — ABNORMAL LOW (ref 4.22–5.81)
Retic Count, Absolute: 123.6 10*3/uL (ref 19.0–186.0)
Retic Ct Pct: 4.2 % — ABNORMAL HIGH (ref 0.4–3.1)

## 2020-10-10 LAB — FOLATE: Folate: 22.3 ng/mL (ref >5.9)

## 2020-10-10 LAB — BASIC METABOLIC PANEL
Anion gap: 11 (ref 5–15)
Anion gap: 3 — ABNORMAL LOW (ref 5–15)
BUN: 38 mg/dL — ABNORMAL HIGH (ref 6–20)
BUN: 36 mg/dL — ABNORMAL HIGH (ref 6–20)
CO2: 19 mmol/L — ABNORMAL LOW (ref 22–32)
CO2: 22 mmol/L (ref 22–32)
Calcium: 9.8 mg/dL (ref 8.9–10.3)
Calcium: 9.2 mg/dL (ref 8.9–10.3)
Chloride: 117 mmol/L — ABNORMAL HIGH (ref 98–111)
Chloride: 117 mmol/L — ABNORMAL HIGH (ref 98–111)
Creatinine, Ser: 1.71 mg/dL — ABNORMAL HIGH (ref 0.61–1.24)
Creatinine, Ser: 1.72 mg/dL — ABNORMAL HIGH (ref 0.61–1.24)
GFR, Estimated: 46 mL/min — ABNORMAL LOW (ref >60)
GFR, Estimated: 46 mL/min — ABNORMAL LOW (ref >60)
Glucose, Bld: 90 mg/dL (ref 70–99)
Glucose, Bld: 108 mg/dL — ABNORMAL HIGH (ref 70–99)
Potassium: 3.6 mmol/L (ref 3.5–5.1)
Potassium: 3.6 mmol/L (ref 3.5–5.1)
Sodium: 147 mmol/L — ABNORMAL HIGH (ref 135–145)
Sodium: 142 mmol/L (ref 135–145)

## 2020-10-10 LAB — CK
Total CK: 56 U/L (ref 49–397)
Total CK: 74 U/L (ref 49–397)

## 2020-10-10 LAB — VANCOMYCIN, RANDOM: Vancomycin Rm: <0

## 2020-10-10 LAB — IRON AND TIBC
Iron: 73 ug/dL (ref 45–182)
Saturation Ratios: 26 % (ref 17.9–39.5)
TIBC: 280 ug/dL (ref 250–450)
UIBC: 207 ug/dL

## 2020-10-10 LAB — COMPREHENSIVE METABOLIC PANEL
ALT: 33 U/L (ref 0–44)
AST: 26 U/L (ref 15–41)
Albumin: 2.8 g/dL — ABNORMAL LOW (ref 3.5–5.0)
Alkaline Phosphatase: 32 U/L — ABNORMAL LOW (ref 38–126)
Anion gap: 8 (ref 5–15)
BUN: 42 mg/dL — ABNORMAL HIGH (ref 6–20)
CO2: 21 mmol/L — ABNORMAL LOW (ref 22–32)
Calcium: 9.4 mg/dL (ref 8.9–10.3)
Chloride: 118 mmol/L — ABNORMAL HIGH (ref 98–111)
Creatinine, Ser: 1.7 mg/dL — ABNORMAL HIGH (ref 0.61–1.24)
GFR, Estimated: 46 mL/min — ABNORMAL LOW (ref >60)
Glucose, Bld: 100 mg/dL — ABNORMAL HIGH (ref 70–99)
Potassium: 3.6 mmol/L (ref 3.5–5.1)
Sodium: 147 mmol/L — ABNORMAL HIGH (ref 135–145)
Total Bilirubin: 0.5 mg/dL (ref 0.3–1.2)
Total Protein: 5.8 g/dL — ABNORMAL LOW (ref 6.5–8.1)

## 2020-10-10 LAB — AMMONIA: Ammonia: 21 umol/L (ref 9–35)

## 2020-10-10 LAB — MRSA NEXT GEN BY PCR, NASAL: MRSA by PCR Next Gen: DETECTED — AB

## 2020-10-10 LAB — FERRITIN: Ferritin: 234 ng/mL (ref 24–336)

## 2020-10-10 LAB — PHOSPHORUS
Phosphorus: 3.3 mg/dL (ref 2.5–4.6)
Phosphorus: 3.6 mg/dL (ref 2.5–4.6)

## 2020-10-10 LAB — PROCALCITONIN
Procalcitonin: <0.1 ng/mL
Procalcitonin: <0.1 ng/mL

## 2020-10-10 LAB — TSH: TSH: 3.908 u[IU]/mL (ref 0.350–4.500)

## 2020-10-10 LAB — OSMOLALITY: Osmolality: 316 mosm/kg — ABNORMAL HIGH (ref 275–295)

## 2020-10-10 MED ORDER — NYSTATIN 100000 UNIT/ML MT SUSP
5.0000 mL | Freq: Four times a day (QID) | OROMUCOSAL | Status: DC
Start: 2020-10-10 — End: 2020-10-14
  Administered 2020-10-10 – 2020-10-14 (×13): 500000 [IU] via OROMUCOSAL
  Filled 2020-10-10 (×17): qty 5

## 2020-10-10 MED ORDER — SODIUM CHLORIDE 0.9 % IV SOLN
INTRAVENOUS | Status: DC | PRN
  Administered 2020-10-10: 250 mL via INTRAVENOUS

## 2020-10-10 MED ORDER — THIAMINE HCL 100 MG/ML IJ SOLN
500.0000 mg | Freq: Three times a day (TID) | INTRAVENOUS | Status: DC
  Administered 2020-10-10 – 2020-10-12 (×4): 500 mg via INTRAVENOUS
  Filled 2020-10-10 (×6): qty 5

## 2020-10-10 MED ORDER — MAGNESIUM SULFATE IN D5W 1-5 GM/100ML-% IV SOLN
1.0000 g | Freq: Once | INTRAVENOUS | Status: AC
  Administered 2020-10-10: 1 g via INTRAVENOUS
  Filled 2020-10-10: qty 100

## 2020-10-10 MED ORDER — CHLORHEXIDINE GLUCONATE 0.12 % MT SOLN
OROMUCOSAL | Status: AC
  Administered 2020-10-10: 15 mL via OROMUCOSAL
  Filled 2020-10-10: qty 15

## 2020-10-10 MED ORDER — VITAMIN B-12 1000 MCG PO TABS: 1000.0000 ug | ORAL_TABLET | Freq: Every day | ORAL | Status: DC

## 2020-10-10 MED ORDER — DARUNAVIR-COBICISTAT 800-150 MG PO TABS
1.0000 | ORAL_TABLET | Freq: Every day | ORAL | Status: DC
  Administered 2020-10-11 – 2020-10-14 (×4): 1 via ORAL
  Filled 2020-10-10 (×4): qty 1

## 2020-10-10 MED ORDER — LACTATED RINGERS IV SOLN: INTRAVENOUS | Status: DC

## 2020-10-10 MED ORDER — CYANOCOBALAMIN 1000 MCG/ML IJ SOLN
1000.0000 ug | Freq: Every day | INTRAMUSCULAR | Status: DC
  Administered 2020-10-10 – 2020-10-14 (×5): 1000 ug via INTRAMUSCULAR
  Filled 2020-10-10 (×5): qty 1

## 2020-10-10 MED ORDER — LORAZEPAM 2 MG/ML IJ SOLN
1.0000 mg | Freq: Once | INTRAMUSCULAR | Status: AC | PRN
  Administered 2020-10-10: 1 mg via INTRAVENOUS
  Filled 2020-10-10: qty 1

## 2020-10-10 MED ORDER — VANCOMYCIN HCL 1250 MG/250ML IV SOLN
1250.0000 mg | INTRAVENOUS | Status: AC
  Administered 2020-10-10: 1250 mg via INTRAVENOUS
  Filled 2020-10-10: qty 250

## 2020-10-10 MED ORDER — VANCOMYCIN HCL 750 MG/150ML IV SOLN: 750.0000 mg | INTRAVENOUS | Status: DC

## 2020-10-10 MED ORDER — LOPERAMIDE HCL 2 MG PO CAPS
2.0000 mg | ORAL_CAPSULE | ORAL | Status: DC | PRN
  Administered 2020-10-10: 2 mg via ORAL
  Filled 2020-10-10: qty 1

## 2020-10-10 MED ORDER — BACLOFEN 10 MG PO TABS
5.0000 mg | ORAL_TABLET | Freq: Two times a day (BID) | ORAL | Status: DC
  Administered 2020-10-10 – 2020-10-14 (×8): 5 mg via ORAL
  Filled 2020-10-10 (×8): qty 1

## 2020-10-10 MED ORDER — MUPIROCIN 2 % EX OINT
1.0000 "application " | TOPICAL_OINTMENT | Freq: Two times a day (BID) | CUTANEOUS | Status: DC
  Administered 2020-10-10 – 2020-10-14 (×9): 1 via NASAL
  Filled 2020-10-10 (×2): qty 22

## 2020-10-10 MED ORDER — SODIUM CHLORIDE 0.9 % IV SOLN
400.0000 mg | INTRAVENOUS | Status: DC
  Administered 2020-10-10: 400 mg via INTRAVENOUS
  Filled 2020-10-10: qty 8

## 2020-10-10 NOTE — Progress Notes (Signed)
Pharmacy Antibiotic Note  [name] [name] is a 60 y.o. 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  Pharmacy has been consulted for Zosyn and Vancomycin dosing.  Zosyn noted on MAR from SNF, unable to determine if currently on Vancomycin from SNF information. Vanc/Zosyn started 9/4 at SNF per ID note  [facility] office note 9/8 stated on Vanc/Zosyn and planned 6 wk course, with follow up at [facility] in one month, and stop Levaquin (for chronic hip infection) while on IV abx. Confusion noted after that visit, 9/26 [facility] office communication noted still on Vanc/Zosyn.   10/10/20 Random Vanc level drawn @ 23:35 = <0 SCr continues elevated at 1.7  Plan: ID rounded: Vancomycin to Daptomycin, cont Zosyn, cont HIV meds as PTA Zosyn 3.375gm q8 - 4 hr infusion Daptomycin 400mg  IV q24 CK weekly, starting 10/3 BMET daily x5 starting 10/3   Height: 6\' 2"  (188 cm) Weight: 56.6 kg (124 lb 12.5 oz) IBW/kg (Calculated) : 82.2  Temp (24hrs), Avg:98.5 F (36.9 C), Min:97.9 F (36.6 C), Max:99.3 F (37.4 C)  Recent Labs  Lab 10/09/20 1452 10/09/20 2331 10/09/20 2335 10/10/20 0308 10/10/20 1348  WBC 10.3  --   --  7.3  --   CREATININE 1.76* 1.71*  --  1.70* 1.72*  LATICACIDVEN 1.6  --   --   --   --   VANCORANDOM  --   --  <0  --   --      Estimated Creatinine Clearance: 37.5 mL/min (A) (by C-G formula based on SCr of 1.72 mg/dL (H)).    Allergies  Allergen Reactions   Collagen Rash    redness    Antimicrobials this admission: 10/1 Zosyn >>  10/2 Vancomycin >>   Dose adjustments this admission:  Microbiology results: 10/1 MRSA PCR: pos 10/1 BCx: ngtd 10/1 UCx: sent  Thank you for allowing pharmacy to be a part of this patient's care.  Minda Ditto PharmD 10/10/2020 3:29 PM

## 2020-10-10 NOTE — Evaluation (Signed)
Physical Therapy Evaluation Patient Details Name: [name] [name] MRN: [id number] DOB: August 14, 1960 Today's Date: 10/10/2020  History of Present Illness  Patient is a 60 year old male who presented from SNF with concerns for encephalopathy. patient was found to have osteomylitis of sacrum N0-5,LZJQB metabolic encephalopathy, and AKI. 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  Clinical Impression  Pt admitted as above and presenting with functional mobility limitations 2* generalized weakness, balance deficits, and limited insight into deficits with poor safety awareness.  This date, pt able to stand at bedside with RW and min assist of 2 to correct for posterior lean but unable to attempt ambulation.  Pt's sister present at end of session and plan is for return to SNF.       Recommendations for follow up therapy are one component of a multi-disciplinary discharge planning process, led by the attending physician.  Recommendations may be updated based on patient status, additional functional criteria and insurance authorization.  Follow Up Recommendations SNF    Equipment Recommendations  None recommended by PT    Recommendations for Other Services       Precautions / Restrictions Precautions Precautions: Fall Precaution Comments: monitor HR Restrictions Weight Bearing Restrictions: No      Mobility  Bed Mobility Overal bed mobility: Needs Assistance Bed Mobility: Supine to Sit;Sit to Supine     Supine to sit: Min assist Sit to supine: Mod assist   General bed mobility comments: patient needed physical assistance for moving BLE and transitioning trunk into upright position on edge of bed.  Increased assist to return to bed as pt becoming agitated    Transfers Overall transfer level: Needs assistance Equipment used: Rolling walker (2 wheeled) Transfers: Sit to/from Stand Sit to Stand: Min assist;+2 safety/equipment;+2 physical assistance          General transfer comment: patient was noted to be impulsive with movements with patient attempting to stand prior to scooting fowards in bed with patient having difficulty following commands. patient was able to participate in sit to stands x3 with min A x 2 with feet blocked to maintain standing posture. posterior leaning noted in standing likely 2* limited DF - sister reports has not been ambulating since insurance stopped paying for PT intervention at SNF  Ambulation/Gait             General Gait Details: Stood x 3 for several minutes each attempt to allow completion of hygiene and linen change following incontinence of bowel and bladder  Stairs            Wheelchair Mobility    Modified Rankin (Stroke Patients Only)       Balance Overall balance assessment: Needs assistance Sitting-balance support: Feet supported Sitting balance-Leahy Scale: Fair     Standing balance support: Bilateral upper extremity supported Standing balance-Leahy Scale: Poor Standing balance comment: posterior lean                             Pertinent Vitals/Pain Pain Assessment: Faces Faces Pain Scale: Hurts even more Pain Location: when staff was assisting with hygiene on bottom after incontinence episode. Pain Descriptors / Indicators: Grimacing;Discomfort;Guarding Pain Intervention(s): Limited activity within patient's tolerance;Monitored during session;Repositioned    Home Living Family/patient expects to be discharged to:: Skilled nursing facility                 Additional Comments: infomation was obtained from sister as patient  is an unreliable historian.    Prior Function Level of Independence: Needs assistance   Gait / Transfers Assistance Needed: patient was mainly using w/c for mobility at SNF per sister report. patient was walking about 90 feet with physical assistance in july/august with RW.  ADL's / Homemaking Assistance Needed: patient has TD from  nursing staff for ADL tasks. patient was able to participate some in self feeding tasks. patient to transition back to SNF for long term care at time of d/c.  Comments: patients sister indicated that patient is a +2-3 to transfer at SNF with therapy.     Hand Dominance        Extremity/Trunk Assessment   Upper Extremity Assessment Upper Extremity Assessment: RUE deficits/detail RUE Deficits / Details: limited ROM in shoulder with patient reporting its tight. LUE Deficits / Details: patient noted to have no ROM with increased resistance with attempts to range L shoulder. elbow and hand ROM WNL with decreased coordination and tremor like movements with activity.    Lower Extremity Assessment Lower Extremity Assessment: Generalized weakness;RLE deficits/detail;LLE deficits/detail RLE Deficits / Details: Ltd DF bilaterally LLE Deficits / Details: Ltd DF bilaterally       Communication   Communication: No difficulties  Cognition Arousal/Alertness: Awake/alert Behavior During Therapy: Impulsive Overall Cognitive Status: History of cognitive impairments - at baseline                                 General Comments: patient had hard time answering questions. sister was present at end of session to provide PLOF for patient.      General Comments      Exercises     Assessment/Plan    PT Assessment Patient needs continued PT services  PT Problem List Decreased strength;Decreased range of motion;Decreased activity tolerance;Decreased balance;Decreased mobility;Decreased cognition;Decreased knowledge of use of DME;Pain;Decreased safety awareness       PT Treatment Interventions DME instruction;Gait training;Functional mobility training;Therapeutic activities;Therapeutic exercise;Balance training;Cognitive remediation;Patient/family education    PT Goals (Current goals can be found in the Care Plan section)  Acute Rehab PT Goals Patient Stated Goal: "lets go" PT  Goal Formulation: With patient Time For Goal Achievement: 10/24/20 Potential to Achieve Goals: Fair    Frequency Min 2X/week   Barriers to discharge        Co-evaluation               AM-PAC PT "6 Clicks" Mobility  Outcome Measure Help needed turning from your back to your side while in a flat bed without using bedrails?: A Little Help needed moving from lying on your back to sitting on the side of a flat bed without using bedrails?: A Lot Help needed moving to and from a bed to a chair (including a wheelchair)?: A Lot Help needed standing up from a chair using your arms (e.g., wheelchair or bedside chair)?: A Lot Help needed to walk in hospital room?: A Lot Help needed climbing 3-5 steps with a railing? : Total 6 Click Score: 12    End of Session Equipment Utilized During Treatment: Gait belt Activity Tolerance: Patient tolerated treatment well Patient left: in bed;with call bell/phone within reach;with nursing/sitter in room Nurse Communication: Mobility status PT Visit Diagnosis: Unsteadiness on feet (R26.81);Other abnormalities of gait and mobility (R26.89);Muscle weakness (generalized) (M62.81);Difficulty in walking, not elsewhere classified (R26.2);History of falling (Z91.81)    Time: 1355-1430 PT Time Calculation (min) (ACUTE ONLY): 35 min  Charges:   PT Evaluation $PT Eval Low Complexity: [place] Pager [phone] Office [phone]   [facility] 10/10/2020, 4:58 PM

## 2020-10-10 NOTE — Progress Notes (Signed)
PT Cancellation Note  Patient Details Name: [name] [name] MRN: [id number] DOB: Nov 02, 1960   Cancelled Treatment:     PT order received but eval deferred this am at request of RN "Not this morning, he's disoriented and restless. Maybe this afternoon"  Will follow.   Gottfried Standish 10/10/2020, 8:53 AM

## 2020-10-10 NOTE — Consult Note (Addendum)
Neurology Consultation  Reason for Consult: rigidity and encephalopathy. Referring Physician: A. Roel Cluck, MD.   CC: Rigidity and encephalopathy per MD, patient can not relate.   History is obtained from: chart, sister.   HPI: [name] [name] is a 60 y.o. male with a PMHx of HIV, DVT LLE, CdHF Grade I, chronic anemia, rectal cancer, sacral osteomyelitis (on Vancomycin and Zosyn at SNF), dysphagia, PTM, sepsis/bacteremia, and multiple decubiti and cysts debridements.   Patient was admitted here on 05/12/20 after being found down at home, During that hospitalization, he was found to have sepsis, cardiogenic shock, group B streptococcal bacteremia, and subacute embolic infarcts.He was discharged to SNF with orders for several weeks of antibiotics. He also had a 23m SDH without need for surgery. He was followed by ID while in SNF.   Patient [facility] He was started on Reglan for intractable hiccups and nausea in July of this year.  He has been on 10 mg 4 times daily since that time.  He was found to have rigidity possibly attributed to Reglan and this has been stopped.   NP called [facility] to get patient's baseline mental status, but RN did not pick up the transferred call. NP spoke to sister. Sister states in May 2022, patient scored a 3/30 on MMSE. By the end of June, his score was 27/30. Sister stated he was walking with a walker about 90 ft with assistance in July, 2022. The Reglan was prescribed on July 15 2020 (570mpo qid), and the dose was doubled (1062mo qid) on 07/19/20. Sister reports that he has been taking this medication since July. Per sister, he has been so confused lately, that he was caught eating silk flowers at his nursing facility. Prior to May hospitalization, he was doing well on his own. Patient has not been able to ambulate for 1-2 months.   +++Note, NP tried later again to speak to RN at his SNF. No one  ever picked up the phone.   Neurology asked to consult for encephalopathy and rigidity.   ROS:  Unable to obtain robust ROS due to altered mental status. He does deny pain, vomiting, HA. He states he has some blurred vision and double vision occurring randomly.   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 His smoking use included cigarettes. He smoked an  average of .3 packs per day. He has never used smokeless tobacco. He reports current alcohol use of about 3.0 standard drinks per week. He reports current drug use. Drug: Marijuana.  Medications  Current Facility-Administered Medications:    acetaminophen (TYLENOL) tablet 650 mg, 650 mg, Oral, Q6H PRN **OR** acetaminophen (TYLENOL) suppository 650 mg, 650 mg, Rectal, Q6H PRN, Roel Cluck, Anastassia, MD   baclofen (LIORESAL) tablet 5 mg, 5 mg, Oral, TID, Doutova, Anastassia, MD, 5 mg at 10/10/20 0945   chlorhexidine gluconate (MEDLINE KIT) (PERIDEX) 0.12 % solution 15 mL, 15 mL, Mouth Rinse, BID, Doutova, Anastassia, MD, 15 mL at 10/10/20 0946   Chlorhexidine Gluconate Cloth 2 % PADS 6 each, 6 each, Topical, Q0600, Toy Baker, MD, 6 each at  10/09/20 2311   cyanocobalamin ((VITAMIN B-12)) injection 1,000 mcg, 1,000 mcg, Intramuscular, Daily, 1,000 mcg at 10/10/20 1006 **FOLLOWED BY** [START ON 10/17/2020] vitamin B-12 (CYANOCOBALAMIN) tablet 1,000 mcg, 1,000 mcg, Oral, Daily, Dwyane Dee, MD   Derrill Memo ON 10/11/2020] darunavir-cobicistat (PREZCOBIX) 800-150 MG per tablet 1 tablet, 1 tablet, Oral, Q breakfast, Wofford, Drew A, RPH   dolutegravir (TIVICAY) tablet 50 mg, 50 mg, Oral, Daily, Doutova, Anastassia, MD, 50 mg at 10/10/20 0946   feeding supplement (ENSURE ENLIVE / ENSURE PLUS) liquid 237 mL, 237 mL, Oral, BID BM, Doutova, Anastassia, MD, 237 mL at 10/10/20 0958   MEDLINE mouth rinse, 15 mL, Mouth Rinse, q12n4p, Doutova, Anastassia, MD, 15 mL at 10/10/20 1209   megestrol (MEGACE) 400 MG/10ML suspension 800 mg, 800 mg, Oral, Daily, Doutova, Anastassia, MD, 800 mg at 10/10/20 0945   mupirocin ointment (BACTROBAN) 2 % 1 application, 1 application, Nasal, BID, Dwyane Dee, MD, 1 application at 72/82/06 0946   nystatin (MYCOSTATIN) 100000 UNIT/ML suspension 500,000 Units, 5 mL, Mouth/Throat, QID, Doutova, Anastassia, MD, 500,000 Units at 10/10/20 0945   piperacillin-tazobactam (ZOSYN) IVPB 3.375 g, 3.375 g, Intravenous, Q8H, Doutova, Anastassia, MD, Stopped at 10/10/20 1021   rilpivirine (EDURANT) tablet 25 mg, 25 mg, Oral, Q breakfast, Doutova, Anastassia, MD, 25 mg at 10/10/20 0156   thiamine tablet 100 mg, 100 mg, Oral, Daily, Doutova, Anastassia, MD, 100 mg at 10/10/20 0945   [START ON 10/11/2020] vancomycin (VANCOREADY) IVPB 750 mg/150 mL, 750 mg, Intravenous, Q24H, Poindexter, Leann T, RPH  Exam: Current vital signs: BP 137/78   Pulse 86   Temp 99.3 F (37.4 C) (Axillary)   Resp 15   Ht 6' 2"  (1.88 m)   Wt 56.6 kg   SpO2 100%   BMI 16.02 kg/m  Vital signs in last 24 hours: Temp:  [97.9 F (36.6 C)-99.3 F (37.4 C)] 99.3 F (37.4 C) (10/02 1153) Pulse Rate:  [58-112] 86 (10/02 1200) Resp:  [8-23] 15 (10/02  1200) BP: (121-158)/(78-116) 137/78 (10/02 1200) SpO2:  [96 %-100 %] 100 % (10/02 1200) Weight:  [56.6 kg-72 kg] 56.6 kg (10/01 2300)  PE: GENERAL: Acute on chronically ill appearing male lying in ICU bed in NAD. Awake and alert. BMI 16.02.   HEENT: normocephalic and atraumatic. LUNGS - Normal respiratory effort.  CV - RRR on tele. ABDOMEN - Soft, nontender. Ext: warm, darkened in feet with multiple bandages. No gangrene.  Psych: affect confused but calm, cooperative, and smiles.   NEURO:  Mental Status: Alert and oriented to self. Unable to tell NP why he is in hospital. Disoriented to age, place ([place]), city ([place]), state ([place]), month, year, month, day, or date. He follows some simple commands. Able to give NP a thumbs up.  Speech/Language:  speech is without dysarthria. ? Expressive aphasia or just distractibility vs bradyphrenia. Unable to name thumb or pinky. Named pen and purpose, names mask, unable to name flashlight. Unable to perform repetition. Speech is fluent, but delayed.   Cranial Nerves:  II: PERRL 5 mm/brisk. visual fields full.  III, IV, VI: EOMI. Lid elevation symmetric and full.  V: sensation is intact and symmetrical to face.  VII: Smile is symmetrical. Very poor dentition.   VIII:hearing intact to voice. IX, X: palate elevation is symmetric. Phonation normal.  XI: normal sternocleidomastoid and trapezius muscle strength. XNT:ZGYFVC is symmetrical without fasciculations.   Motor:  RUE: grips  5/5       triceps 4+/5     biceps  4+/5      LUE: grips  5/5      triceps  4+/5      biceps   4+/5 RLE:  knee  0/5    thigh 0/5      plantar flexion  1/5     dorsiflexion   1/5 LLE:  knee  0/5   thigh  0/5     plantar flexion   1/5     dorsiflexion   1/5 Can not lift legs off bed. Able to wiggle toes.   He has significant cogwheeling rigidity bilaterally.  Sensation- Intact to light touch bilaterally in all four extremities. Unable to feel vibration on big toes.  Vibration + to knees and UEs. Temperature intact to face, BUEs, BLEs. However, given his memory issues and distractibility, this may be unreliable.   Coordination: FTN intact on right but tremor +. Unable to perform FNF LUE or HKS BLEs. He is able to lift his arms and hold for 10 seconds. DTRs:  RUE:  brachioradialis 1     RLE:  patella 0       LUE:  brachioradialis  1  LLE: patella  0     Gait- deferred.  Labs I have reviewed labs in epic and the results pertinent to this consultation are:  B12 111. TSH 3.908. CK 56. Procalc < .10. WBCC 73K. Na 147.   CBC    Component Value Date/Time   WBC 7.3 10/10/2020 0308   RBC 2.65 (L) 10/10/2020 0308   RBC 2.95 (L) 10/09/2020 2332   HGB 8.9 (L) 10/10/2020 0308   HGB 11.6 (L) 06/19/2008 1117   HCT 27.6 (L) 10/10/2020 0308   HCT 32.2 (L) 06/19/2008 1117   PLT 132 (L) 10/10/2020 0308   PLT 158 06/19/2008 1117   MCV 104.2 (H) 10/10/2020 0308   MCV 121.6 (H) 06/19/2008 1117   MCH 33.6 10/10/2020 0308   MCHC 32.2 10/10/2020 0308   RDW 15.8 (H) 10/10/2020 0308   RDW 12.9 06/19/2008 1117   LYMPHSABS 1.2 10/10/2020 0308   LYMPHSABS 0.9 06/19/2008 1117   MONOABS 0.6 10/10/2020 0308   MONOABS 0.3 06/19/2008 1117   EOSABS 0.1 10/10/2020 0308   EOSABS 0.2 06/19/2008 1117   BASOSABS 0.0 10/10/2020 0308   BASOSABS 0.0 06/19/2008 1117    CMP     Component Value Date/Time   NA 147 (H) 10/10/2020 0308   K 3.6 10/10/2020 0308   CL 118 (H) 10/10/2020 0308   CO2 21 (L) 10/10/2020 0308   GLUCOSE 100 (H) 10/10/2020 0308   BUN 42 (H) 10/10/2020 0308   CREATININE 1.70 (H) 10/10/2020 0308   CREATININE 1.04 06/17/2020 0447   CALCIUM 9.4 10/10/2020 0308   PROT 5.8 (L) 10/10/2020 0308   ALBUMIN  2.8 (L) 10/10/2020 0308   AST 26 10/10/2020 0308   ALT 33 10/10/2020 0308   ALKPHOS 32 (L) 10/10/2020 0308   BILITOT 0.5 10/10/2020 0308   GFRNONAA 46 (L) 10/10/2020 0308   GFRNONAA 70 06/23/2015 1054   GFRAA 81 06/23/2015 1054   Imaging MD reviewed  the images obtained.  CT head 10/09/20.  No acute intracranial process.  MRI brain 10/10/20.  .-No acute finding. -Chronic ischemic injury and advanced brain atrophy.  Assessment: 60 yo male presenting from SNF for increased confusion x 3 days. He was found to have rigidity similar to Parkinsonian. Reglan has been stopped as we thought that could be the culprit as it has risk factors/side effects same as antipsychotics.   Neuroleptic malignancy syndrome (NMS) has also be en considered as patient presented with fever, rigidity, hypernatremia, and altered mental status. However, other presenting signs with NMS are usually a CK over 1000-which he does not have, leukocytosis usually between 10-40K which his labs do not demonstrate.  With his exam much more consistent with parkinsonism, I think that this is very unlikely.  He has become delirious each time he has had an active infection, and therefore I think he is someone who is very predisposed to delirium.  Given that many of the treatments for drug-induced parkinsonism or anticholinergics, though I would typically favor these, I would not in his case due to the likelihood of worsening his delirium.  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   -Follow daily to observe progress.  -Continue Vitamin B12 supplementation.  -I would favor doing two additional days of high-dose IV thiamine, 500 mg 3 times daily -Continue to correct metabolic derangements as you are doing.  -EEG  Pt seen by Clance Boll, NP/Neuro and later by MD. Note/plan to be edited by MD as needed.  Pager: [phone]  I have seen the patient reviewed the above note.  He has increased tone with an exam consistent with parkinsonism.  I strongly suspect that this is secondary to the Reglan.  Unclear exactly what is instigating his current bout of delirium, but certainly AKI could be contributing.   Also AKI will considerably reduce baclofen clearance, and therefore I would favor reducing baclofen to 5 mg twice daily for now, but would resume 3 times daily dosing once creatinine improves.  Roland Rack, MD [facility] [phone]  If 7pm- 7am, please page neurology on call as listed in [place].

## 2020-10-10 NOTE — Hospital Course (Signed)
[name] [name] is a 60 y.o. 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  He presented with increased confusion over past 3 wks while at [facility]. Workup including UA, CXR were unremarkable.  He reportedly had 9 teeth removed approx 3-4 days PTA then started doing worse.   Prior hospitalization included a complicated admission in May 2022, he had multiple sub-acute infarcts on his head CT. He initially required pressors and was treated with Ceftriaxone IVPB to end on 06-28-20.  Dysphagia on dysphagia 3 diet with aspiration precautions. HIV ART was restarted in hospital Prezcobix/edurant/tivicay daily.    Has multiple ulcers including wound Vac on his L foot . He also has wound on his R foot.   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 No drainable soft tissue abscess at this time."  He has been treated with Vanc and Zosyn at SNF via PICC. Creatinine on admission showed worsening at 1.71, up from baseline 1.04 and he was admitted for further workup.

## 2020-10-10 NOTE — Progress Notes (Signed)
SLP Cancellation Note  Patient Details Name: [name] [name] MRN: [id number] DOB: 06/28/1960   Cancelled treatment:       Reason Eval/Treat Not Completed: Patient at procedure or test/unavailable Per discussion with RN, pt leaving unit for MRI. SLP service will continue efforts for swallow evaluation.  [name] 10/10/2020, 10:39 AM

## 2020-10-10 NOTE — Evaluation (Addendum)
Occupational Therapy Evaluation Patient Details Name: [name] [name] MRN: [id number] DOB: 1960-03-20 Today's Date: 10/10/2020   History of Present Illness Patient is a 60 year old male who presented from SNF with concerns for encephalopathy. 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   Clinical Impression   Patient is a 60 year old long term care resident at SNF. Patient appears to be at baseline for ADLs at this time.Patient to transition back to SNF for LTC services at time of d/c per sister report. Patients sister was able to provide prior level on this date. OT signing off at this time.       Recommendations for follow up therapy are one component of a multi-disciplinary discharge planning process, led by the attending physician.  Recommendations may be updated based on patient status, additional functional criteria and insurance authorization.   Follow Up Recommendations  SNF    Equipment Recommendations  None recommended by OT (patient is LTC at [facility])    Recommendations for Other Services       Precautions / Restrictions Precautions Precautions: Fall Precaution Comments: monitor HR Restrictions Weight Bearing Restrictions: No      Mobility Bed Mobility Overal bed mobility: Needs Assistance Bed Mobility: Supine to Sit     Supine to sit: Min assist     General bed mobility comments: patient needed physical assistance for moving BLE and transitioning trunk into upright position on edge of bed.    Transfers Overall transfer level: Needs assistance Equipment used: Rolling walker (2 wheeled) Transfers: Sit to/from Stand Sit to Stand: Min assist;+2 safety/equipment;+2 physical assistance         General transfer comment: patient was noted to be impulsive with movements with patient attempting to stand prior to scooting fowards in bed with patient having  difficulty following commands. patient was able to participate in sit to stands x3 with min A x 2 with feet blocked to maintain standing posture. posterior leaning noted in standing. Patient was noted to have HR spike to 143 bpm while standing last attempt during nursing providing hygiene care at edge of bed. Nurse was present at this time.     Balance Overall balance assessment: Needs assistance Sitting-balance support: Feet supported Sitting balance-Leahy Scale: Fair     Standing balance support: Bilateral upper extremity supported Standing balance-Leahy Scale: Poor                             ADL either performed or assessed with clinical judgement   ADL Overall ADL's : At baseline                                       General ADL Comments: patient is TD for ADL tasks at baseline with patient a long term care resident at [facility]. patient to transition back to SNF at time of d/c per sister report.     Vision   Additional Comments: unable to assess formally with patient having difficulty with following commands. patient was able to read/ remember therapist  name off name tag.     Perception     Praxis      Pertinent Vitals/Pain Pain Assessment: Faces Faces Pain Scale: Hurts even more Pain Location: when staff was assisting with hygiene on bottom  after incontinence episode. Pain Descriptors / Indicators: Grimacing;Discomfort;Guarding Pain Intervention(s): Repositioned;Monitored during session     Hand Dominance     Extremity/Trunk Assessment Upper Extremity Assessment Upper Extremity Assessment: RUE deficits/detail;LUE deficits/detail RUE Deficits / Details: limited ROM in shoulder with patient reporting its tight. LUE Deficits / Details: patient noted to have no ROM with increased resistance with attempts to range L shoulder. elbow and hand ROM WNL with decreased coordination and tremor like movements with activity.   Lower Extremity  Assessment Lower Extremity Assessment: Defer to PT evaluation       Communication Communication Communication: No difficulties   Cognition Arousal/Alertness: Awake/alert Behavior During Therapy: Impulsive Overall Cognitive Status: History of cognitive impairments - at baseline                                 General Comments: patient had hard time answering questions. sister was present at end of session to provide PLOF for patient.   General Comments       Exercises     Shoulder Instructions      Home Living Family/patient expects to be discharged to:: Skilled nursing facility                                 Additional Comments: infomation was obtained from sister as patient is an unreliable historian.      Prior Functioning/Environment Level of Independence: Needs assistance  Gait / Transfers Assistance Needed: patient was mainly using w/c for mobility at SNF per sister report. patient was walking about 90 feet with physical assistance in july/august with RW. ADL's / Homemaking Assistance Needed: patient has TD from nursing staff for ADL tasks. patient was able to participate some in self feeding tasks. patient to transition back to SNF for long term care at time of d/c.   Comments: patients sister indicated that patient is a +2-3 to transfer at SNF with therapy.        OT Problem List: Decreased strength;Decreased range of motion;Decreased activity tolerance;Impaired balance (sitting and/or standing);Decreased safety awareness;Decreased cognition;Decreased knowledge of precautions;Decreased knowledge of use of DME or AE      OT Treatment/Interventions:      OT Goals(Current goals can be found in the care plan section) Acute Rehab OT Goals Patient Stated Goal: "lets go" OT Goal Formulation: Patient unable to participate in goal setting  OT Frequency:     Barriers to D/C:            Co-evaluation              AM-PAC OT "6  Clicks" Daily Activity     Outcome Measure Help from another person eating meals?: A Little Help from another person taking care of personal grooming?: A Lot Help from another person toileting, which includes using toliet, bedpan, or urinal?: Total Help from another person bathing (including washing, rinsing, drying)?: Total Help from another person to put on and taking off regular upper body clothing?: Total Help from another person to put on and taking off regular lower body clothing?: Total 6 Click Score: 9   End of Session Equipment Utilized During Treatment: Gait belt;Rolling walker Nurse Communication: Other (comment) (nurse cleared patient to participate in session)  Activity Tolerance: Patient tolerated treatment well Patient left: in bed;with call bell/phone within reach;with nursing/sitter in room  OT Visit Diagnosis: Unsteadiness on feet (R26.81);Muscle  weakness (generalized) (M62.81);Other abnormalities of gait and mobility (R26.89)                Time: 5797-2820 OT Time Calculation (min): 34 min Charges:  OT General Charges $OT Visit: 1 Visit OT Evaluation $OT Eval Low Complexity: 1 Low  Jackelyn Poling OTR/L, MS Acute Rehabilitation Department Office# [phone] Pager# [phone]   [place] 10/10/2020, 4:25 PM

## 2020-10-10 NOTE — Consult Note (Signed)
[facility]       Reason for Consult: osteomyelitis    Referring Physician: Dr. Sabino Gasser  Active Problems:   Human immunodeficiency virus (HIV) disease ([place])   Unspecified severe protein-calorie malnutrition ([place])   AKI (acute kidney injury) ([place])   Acute metabolic encephalopathy   Sacral osteomyelitis ([place])   Chronic diastolic CHF (congestive heart failure) (HCC)   Anemia   Rigidity   Hypernatremia   Functional quadriplegia (HCC)   Pressure injury of skin    baclofen  5 mg Oral TID   chlorhexidine gluconate (MEDLINE KIT)  15 mL Mouth Rinse BID   Chlorhexidine Gluconate Cloth  6 each Topical Q0600   cyanocobalamin  1,000 mcg Intramuscular Daily   Followed by   Derrill Memo ON 10/17/2020] vitamin B-12  1,000 mcg Oral Daily   [START ON 10/11/2020] darunavir-cobicistat  1 tablet Oral Q breakfast   dolutegravir  50 mg Oral Daily   feeding supplement  237 mL Oral BID BM   mouth rinse  15 mL Mouth Rinse q12n4p   megestrol  800 mg Oral Daily   mupirocin ointment  1 application Nasal BID   nystatin  5 mL Mouth/Throat QID   rilpivirine  25 mg Oral Q breakfast   thiamine  100 mg Oral Daily    Recommendations:  Will change to daptomycin Continue piperacillin/tazobactam  Assessment: He has osteomyelitis and on treatment and here for complications of Reglan.  With his renal insufficiency that appears prerenal, will change to daptomycin + pip/tazo   Dr. Gale Journey on tomorrow  Antibiotics: Vancomycin + pip/tazo  HPI: [name] [name] is a 60 y.o. male with a history of HIV and osteomyelitis of the sacrum, sent in by his facility due to encephalopathy.  This has been progressively worsening over the last 3 weeks and brought in for evaluation.  On exam he was noted to have rigidity and cogwheeling and concern for a drug affect from Reglan, which was started in July.  He has osteomyelitis of the sacrum at the S4 and S5 level with an overlying decubitus ulcer and has  been on vancomycin and piperacillin/tazobactam for this started on 09/12/20.  He was seen by [name] on 9/8 with a plan to continue with both antibiotics for 6 weeks.  He has a history of a right total hip infection and has been on chronic suppressive levaquin, which is currently being held since he is on above antibiotics.  For his HIV, his ART was restarted during his hospitalization in May 2022 and his most recent CD4 from July is 334 and viral load 73 copies.  Sister at bedside.     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    Family History  Problem Relation Age of Onset   Diabetes Mother    Dementia Mother    Atrial fibrillation Mother    Hypertension Mother    Cancer Father    Emphysema Father     Allergies  Allergen Reactions   Collagen Rash    redness    Physical Exam: Constitutional: in no apparent distress  Vitals:   10/10/20 1153 10/10/20 1200  BP:  137/78  Pulse:  86  Resp:  15   Temp: 99.3 F (37.4 C)   SpO2:  100%   EYES: anicteric ENMT: no thrush Cardiovascular: Cor RRR Respiratory: clear; GI: Bowel sounds are normal, liver is not enlarged, spleen is not enlarged Musculoskeletal: no pedal edema noted Skin: negatives: no rash Neuro: confused  Lab Results  Component Value Date   WBC 7.3 10/10/2020   HGB 8.9 (L) 10/10/2020   HCT 27.6 (L) 10/10/2020   MCV 104.2 (H) 10/10/2020   PLT 132 (L) 10/10/2020    Lab Results  Component Value Date   CREATININE 1.70 (H) 10/10/2020   BUN 42 (H) 10/10/2020   NA 147 (H) 10/10/2020   K 3.6 10/10/2020   CL 118 (H) 10/10/2020   CO2 21 (L) 10/10/2020    Lab Results  Component Value Date   ALT 33 10/10/2020   AST 26 10/10/2020   ALKPHOS 32 (L) 10/10/2020     Microbiology: Recent Results (from the past 240 hour(s))  Blood Culture (routine x 2)     Status: None (Preliminary result)   Collection Time: 10/09/20  6:10 PM   Specimen: BLOOD RIGHT HAND  Result Value Ref Range Status   Specimen Description   Final    BLOOD RIGHT HAND Performed at [facility], [address] [place], [place] [address]    Special Requests   Final    BOTTLES DRAWN AEROBIC AND ANAEROBIC Blood Culture results may not be optimal due to an inadequate volume of blood received in culture bottles Performed at [facility] [address] [place], [place] [address]    Culture   Final    NO GROWTH < 12 HOURS Performed at [facility] [address], [place], [place] [address]    Report Status PENDING  Incomplete  Resp Panel by RT-PCR (Flu A&B, Covid) Nasopharyngeal Swab     Status: None   Collection Time: 10/09/20  6:25 PM   Specimen: Nasopharyngeal Swab; Nasopharyngeal(NP) swabs in vial transport medium  Result Value Ref Range Status   SARS Coronavirus 2 by RT PCR NEGATIVE NEGATIVE Final    Comment: (NOTE) SARS-CoV-2 target nucleic acids are NOT DETECTED.  The SARS-CoV-2 RNA is generally detectable  in upper respiratory specimens during the acute phase of infection. The lowest concentration of SARS-CoV-2 viral copies this assay can detect is 138 copies/mL. A negative result does not preclude SARS-Cov-2 infection and should not be used as the sole basis  for treatment or other patient management decisions. A negative result may occur with  improper specimen collection/handling, submission of specimen other than nasopharyngeal swab, presence of viral mutation(s) within the areas targeted by this assay, and inadequate number of viral copies(<138 copies/mL). A negative result must be combined with clinical observations, patient history, and epidemiological information. The expected result is Negative.  Fact Sheet for Patients:  EntrepreneurPulse.com.au  Fact Sheet for Healthcare Providers:  IncredibleEmployment.be  This test is no t yet approved or cleared by the [place] FDA and  has been authorized for detection and/or diagnosis of SARS-CoV-2 by FDA under an Emergency Use Authorization (EUA). This EUA will remain  in effect (meaning this test can be used) for the duration of the COVID-19 declaration under Section 564(b)(1) of the Act, 21 U.S.C.section 360bbb-3(b)(1), unless the authorization is terminated  or revoked sooner.       Influenza A by PCR NEGATIVE NEGATIVE Final   Influenza B by PCR NEGATIVE NEGATIVE Final    Comment: (NOTE) The Xpert Xpress SARS-CoV-2/FLU/RSV plus assay is intended as an aid in the diagnosis of influenza from Nasopharyngeal swab specimens and should not be used as a sole basis for treatment. Nasal washings and aspirates are unacceptable for Xpert Xpress SARS-CoV-2/FLU/RSV testing.  Fact Sheet for Patients: EntrepreneurPulse.com.au  Fact Sheet for Healthcare Providers: IncredibleEmployment.be  This test is not yet approved or cleared by the [place] FDA and has  been authorized for detection and/or diagnosis of SARS-CoV-2 by FDA under an Emergency Use Authorization (EUA). This EUA will remain in effect (meaning this test can be used) for the duration of the COVID-19 declaration under Section 564(b)(1) of the Act, 21 U.S.C. section 360bbb-3(b)(1), unless the authorization is terminated or revoked.  Performed at [facility], [place] [address], [place], [place] [address]   MRSA Next Gen by PCR, Nasal     Status: Abnormal   Collection Time: 10/09/20 10:50 PM   Specimen: Nasal Mucosa; Nasal Swab  Result Value Ref Range Status   MRSA by PCR Next Gen DETECTED (A) NOT DETECTED Final    Comment: RESULT CALLED TO, READ BACK BY AND VERIFIED WITH: Jeanine Luz, RN 10/10/20 0743 KDS (NOTE) The GeneXpert MRSA Assay (FDA approved for NASAL specimens only), is one component of a comprehensive MRSA colonization surveillance program. It is not intended to diagnose MRSA infection nor to guide or monitor treatment for MRSA infections. Test performance is not FDA approved in patients less than 25 years old. Performed at [facility], [place] [address] [place], [place] [address]     Syra Sirmons W Granvil Djordjevic, MD [facility] [email or web address] 10/10/2020, 1:52 PM

## 2020-10-10 NOTE — Progress Notes (Signed)
Progress Note    CORDIE BUENING   DGL:[id number]  DOB: Mar 14, 1960  DOA: 10/09/2020     0 Date of Service: 10/10/2020   [name] [name] is a 60 y.o. 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  He presented with increased confusion over past 3 wks while at [facility]. Workup including UA, CXR were unremarkable.  He reportedly had 9 teeth removed approx 3-4 days PTA then started doing worse.   Prior hospitalization included a complicated admission in May 2022, he had multiple sub-acute infarcts on his head CT. He initially required pressors and was treated with Ceftriaxone IVPB to end on 06-28-20.  Dysphagia on dysphagia 3 diet with aspiration precautions. HIV ART was restarted in hospital Prezcobix/edurant/tivicay daily.    Has multiple ulcers including wound Vac on his L foot . He also has wound on his R foot.   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 No drainable soft tissue abscess at this time."  He has been treated with Vanc and Zosyn at SNF via PICC. Creatinine on admission showed worsening at 1.71, up from baseline 1.04 and he was admitted for further workup.   Subjective:  Patient resting in bed in no distress.  He is grossly confused and only able to state his name.  He cannot provide any other orientation answers or state why he is in the hospital.  Hospital Problems  Acute metabolic encephalopathy  - patient symptoms include worsening confusion - etiology considered due to metabolic derangements and possibly from ongoing use of Reglan - Neurology consulted on admission due to this concern.  Plan was for initiation of dantrolene or  bromocriptine if needed - Other considered etiology includes his worsening renal function and increased BUN which could be contributing to uremic encephalopathy - Continue fluids and monitoring mentation as well as renal function -Follow-up brain MRI  AKI - baseline creatinine ~ 1 - patient presents with increase in creat >0.3 mg/dL above baseline, creat increase >1.5x baseline presumed to have occurred within past 7 days PTA - FeNa 2.3% which classifies as intrinsic; concern is prolonged use of Vanc/Zosyn contributing to nephrotoxicity - continue IVF for now and follow up ID consult for modification of abx - if renal function fails to improve may warrant nephro consult if starts to approach HD need but modify abx for now  Sacral osteomyelitis -Has been on prolonged vancomycin/Zosyn, now with AKI - ID consulted, follow-up recommendations regarding antibiotic regimen adjustment - Last imaging study was MRI sacrum on 09/03/2020 showing OM at S4 S5  Hypernatremia - continue D51/2NS; repeat BMP this afternoon; if improved, will switch to LR  HIV - last CD4 was 334 on 07/20/20 - follow up repeat CD4 - continue ART  Thrush - continue nystatin   Severe Protein calorie malnutrition - Patient's BMI is Body mass index is 16.02 kg/m.. - Patient has the following signs/symptoms consistent with PCM: (fat loss, muscle loss, muscle wasting). - follow up RD consult  Chronic diastolic CHF - no s/s exacerbation - continue IVF    Macrocytic Anemia -Baseline hemoglobin around 8 g/dL -Likely mixed etiology in setting of B12 deficiency and ACD -Start on B12 replacement and will need to be discharged with  oral supplementation  Decubitus ulcer  - follow up [place] consult   Functional quadriplegia  - PT OT assessment patient will need to be back to SNF after discharge   History of dysphagia  - follow up SLP eval     Objective Vital signs were reviewed and unremarkable.  Vitals:   10/10/20  0500 10/10/20 0600 10/10/20 0800 10/10/20 0900  BP: 138/88 (!) 151/78 (!) 154/84 (!) 144/85  Pulse: (!) 59 (!) 58 66 77  Resp: (!) 23 17 (!) 8 19  Temp:   98.3 F (36.8 C)   TempSrc:   Oral   SpO2: 99% 100% 98% 99%  Weight:      Height:       56.6 kg  Exam General appearance:  Awake and alert but grossly confused and only oriented to name.  Resting in bed in no distress Head: Normocephalic, without obvious abnormality, atraumatic Eyes:  EOMI Lungs: clear to auscultation bilaterally Heart: regular rate and rhythm and S1, S2 normal Abdomen: normal findings: bowel sounds normal and soft, non-tender Extremities:  No edema.  Bilateral heels covered in dressings Skin: mobility and turgor normal Neurologic: Follows commands and moves all 4 extremities.  No focal deficits but oriented only to name    Labs / Other Information My review of labs, imaging, notes and other tests is significant for    Results for orders placed or performed during the hospital encounter of 10/09/20 (from the past 48 hour(s))  Lactic acid, plasma     Status: None   Collection Time: 10/09/20  2:52 PM  Result Value Ref Range   Lactic Acid, Venous 1.6 0.5 - 1.9 mmol/L    Comment: Performed at [facility], [place] [address] [place], [place] [address]  Comprehensive metabolic panel     Status: Abnormal   Collection Time: 10/09/20  2:52 PM  Result Value Ref Range   Sodium 148 (H) 135 - 145 mmol/L   Potassium 3.9 3.5 - 5.1 mmol/L   Chloride 116 (H) 98 - 111 mmol/L   CO2 20 (L) 22 - 32 mmol/L   Glucose, Bld 125 (H) 70 - 99 mg/dL    Comment: Glucose reference range applies only to samples taken after fasting for at least 8 hours.   BUN 43 (H) 6 - 20 mg/dL   Creatinine, Ser 1.76 (H) 0.61 - 1.24 mg/dL   Calcium 9.8 8.9 - 10.3 mg/dL   Total Protein 6.7 6.5 - 8.1 g/dL   Albumin 3.3 (L) 3.5 - 5.0 g/dL   AST 30 15 - 41 U/L   ALT 35 0 - 44 U/L   Alkaline Phosphatase 37 (L) 38 - 126 U/L   Total  Bilirubin 0.9 0.3 - 1.2 mg/dL   GFR, Estimated 44 (L) >60 mL/min    Comment: (NOTE) Calculated using the CKD-EPI Creatinine Equation (2021)    Anion gap 12 5 - 15    Comment: Performed at [facility], [place] [address], [place], [place] [address]  CBC with Differential     Status: Abnormal   Collection Time: 10/09/20  2:52 PM  Result Value Ref Range   WBC 10.3 4.0 - 10.5 K/uL   RBC 3.15 (L) 4.22 - 5.81 MIL/uL   Hemoglobin 10.5 (L) 13.0 - 17.0 g/dL   HCT 32.9 (L) 39.0 - 52.0 %   MCV 104.4 (H) 80.0 - 100.0 fL   MCH 33.3 26.0 - 34.0 pg   MCHC 31.9 30.0 - 36.0 g/dL  RDW 15.7 (H) 11.5 - 15.5 %   Platelets 147 (L) 150 - 400 K/uL   nRBC 1.0 (H) 0.0 - 0.2 %   Neutrophils Relative % 73 %   Neutro Abs 7.5 1.7 - 7.7 K/uL   Lymphocytes Relative 13 %   Lymphs Abs 1.3 0.7 - 4.0 K/uL   Monocytes Relative 7 %   Monocytes Absolute 0.7 0.1 - 1.0 K/uL   Eosinophils Relative 1 %   Eosinophils Absolute 0.1 0.0 - 0.5 K/uL   Basophils Relative 0 %   Basophils Absolute 0.0 0.0 - 0.1 K/uL   Immature Granulocytes 6 %   Abs Immature Granulocytes 0.58 (H) 0.00 - 0.07 K/uL   Polychromasia PRESENT     Comment: Performed at [facility], [place] [address], [place], [place] [address]  Protime-INR     Status: None   Collection Time: 10/09/20  2:52 PM  Result Value Ref Range   Prothrombin Time 13.9 11.4 - 15.2 seconds   INR 1.1 0.8 - 1.2    Comment: (NOTE) INR goal varies based on device and disease states. Performed at [facility], [place] [address], [place], [place] [address]   C-reactive protein     Status: Abnormal   Collection Time: 10/09/20  2:52 PM  Result Value Ref Range   CRP 1.4 (H) <1.0 mg/dL    Comment: Performed at [facility], [place] [address], [place], [place] [address]  Sedimentation rate     Status: Abnormal   Collection Time: 10/09/20  2:52 PM  Result Value Ref Range   Sed Rate 50 (H) 0 - 16 mm/hr    Comment:  Performed at [facility], [place] [address], [place], [place] [address]  Urinalysis, Routine w reflex microscopic In/Out Cath Urine     Status: None   Collection Time: 10/09/20  6:00 PM  Result Value Ref Range   Color, Urine YELLOW YELLOW   APPearance CLEAR CLEAR   Specific Gravity, Urine 1.016 1.005 - 1.030   pH 5.0 5.0 - 8.0   Glucose, UA NEGATIVE NEGATIVE mg/dL   Hgb urine dipstick NEGATIVE NEGATIVE   Bilirubin Urine NEGATIVE NEGATIVE   Ketones, ur NEGATIVE NEGATIVE mg/dL   Protein, ur NEGATIVE NEGATIVE mg/dL   Nitrite NEGATIVE NEGATIVE   Leukocytes,Ua NEGATIVE NEGATIVE    Comment: Performed at [place] [address], [place], [place] [address]  Blood Culture (routine x 2)     Status: None (Preliminary result)   Collection Time: 10/09/20  6:10 PM   Specimen: BLOOD RIGHT HAND  Result Value Ref Range   Specimen Description      BLOOD RIGHT HAND Performed at [facility], [place] [address], [place], [place] [address]    Special Requests      BOTTLES DRAWN AEROBIC AND ANAEROBIC Blood Culture results may not be optimal due to an inadequate volume of blood received in culture bottles Performed at [facility], [place] [address], [place], [place] [address]    Culture      NO GROWTH < 12 HOURS Performed at [place] [address] [place], [place] [address]    Report Status PENDING   Resp Panel by RT-PCR (Flu A&B, Covid) Nasopharyngeal Swab     Status: None   Collection Time: 10/09/20  6:25 PM   Specimen: Nasopharyngeal Swab; Nasopharyngeal(NP) swabs in vial transport medium  Result Value Ref Range   SARS Coronavirus 2  by RT PCR NEGATIVE NEGATIVE    Comment: (NOTE) SARS-CoV-2 target nucleic acids are NOT DETECTED.  The SARS-CoV-2 RNA is generally detectable in upper respiratory specimens during the acute phase of infection. The lowest concentration of SARS-CoV-2 viral copies this assay can detect is 138  copies/mL. A negative result does not preclude SARS-Cov-2 infection and should not be used as the sole basis for treatment or other patient management decisions. A negative result may occur with  improper specimen collection/handling, submission of specimen other than nasopharyngeal swab, presence of viral mutation(s) within the areas targeted by this assay, and inadequate number of viral copies(<138 copies/mL). A negative result must be combined with clinical observations, patient history, and epidemiological information. The expected result is Negative.  Fact Sheet for Patients:  EntrepreneurPulse.com.au  Fact Sheet for Healthcare Providers:  IncredibleEmployment.be  This test is no t yet approved or cleared by the [place] FDA and  has been authorized for detection and/or diagnosis of SARS-CoV-2 by FDA under an Emergency Use Authorization (EUA). This EUA will remain  in effect (meaning this test can be used) for the duration of the COVID-19 declaration under Section 564(b)(1) of the Act, 21 U.S.C.section 360bbb-3(b)(1), unless the authorization is terminated  or revoked sooner.       Influenza A by PCR NEGATIVE NEGATIVE   Influenza B by PCR NEGATIVE NEGATIVE    Comment: (NOTE) The Xpert Xpress SARS-CoV-2/FLU/RSV plus assay is intended as an aid in the diagnosis of influenza from Nasopharyngeal swab specimens and should not be used as a sole basis for treatment. Nasal washings and aspirates are unacceptable for Xpert Xpress SARS-CoV-2/FLU/RSV testing.  Fact Sheet for Patients: EntrepreneurPulse.com.au  Fact Sheet for Healthcare Providers: IncredibleEmployment.be  This test is not yet approved or cleared by the [place] FDA and has been authorized for detection and/or diagnosis of SARS-CoV-2 by FDA under an Emergency Use Authorization (EUA). This EUA will remain in effect (meaning this test  can be used) for the duration of the COVID-19 declaration under Section 564(b)(1) of the Act, 21 U.S.C. section 360bbb-3(b)(1), unless the authorization is terminated or revoked.  Performed at [facility], [place] [address], [place], [place] [address]   MRSA Next Gen by PCR, Nasal     Status: Abnormal   Collection Time: 10/09/20 10:50 PM   Specimen: Nasal Mucosa; Nasal Swab  Result Value Ref Range   MRSA by PCR Next Gen DETECTED (A) NOT DETECTED    Comment: RESULT CALLED TO, READ BACK BY AND VERIFIED WITH: Jeanine Luz, RN 10/10/20 0743 KDS (NOTE) The GeneXpert MRSA Assay (FDA approved for NASAL specimens only), is one component of a comprehensive MRSA colonization surveillance program. It is not intended to diagnose MRSA infection nor to guide or monitor treatment for MRSA infections. Test performance is not FDA approved in patients less than 61 years old. Performed at [name], [place] [address], [place], [place] [address]   Urine rapid drug screen (hosp performed)     Status: Abnormal   Collection Time: 10/09/20 11:05 PM  Result Value Ref Range   Opiates POSITIVE (A) NONE DETECTED   Cocaine NONE DETECTED NONE DETECTED   Benzodiazepines NONE DETECTED NONE DETECTED   Amphetamines NONE DETECTED NONE DETECTED   Tetrahydrocannabinol NONE DETECTED NONE DETECTED   Barbiturates NONE DETECTED NONE DETECTED    Comment: (NOTE) DRUG SCREEN FOR MEDICAL PURPOSES ONLY.  IF CONFIRMATION IS NEEDED FOR ANY PURPOSE, NOTIFY LAB WITHIN 5 DAYS.  LOWEST DETECTABLE LIMITS  FOR URINE DRUG SCREEN Drug Class                     Cutoff (ng/mL) Amphetamine and metabolites    1000 Barbiturate and metabolites    200 Benzodiazepine                 790 Tricyclics and metabolites     300 Opiates and metabolites        300 Cocaine and metabolites        300 THC                            50 Performed at [place] [address] [place], [place] [address]   Creatinine, urine, random     Status: None   Collection Time: 10/09/20 11:06 PM  Result Value Ref Range   Creatinine, Urine 63.96 mg/dL    Comment: Performed at [name], [place] [address] [place], [place] [address]  Sodium, urine, random     Status: None   Collection Time: 10/09/20 11:06 PM  Result Value Ref Range   Sodium, Ur 126 mmol/L    Comment: Performed at [facility], [place] [address], [place], [place] [address]  Osmolality, urine     Status: None   Collection Time: 10/09/20 11:06 PM  Result Value Ref Range   Osmolality, Ur 570 300 - 900 mOsm/kg    Comment: Performed at [place] [address] [place], [place] [address]  Basic metabolic panel     Status: Abnormal   Collection Time: 10/09/20 11:31 PM  Result Value Ref Range   Sodium 147 (H) 135 - 145 mmol/L   Potassium 3.6 3.5 - 5.1 mmol/L   Chloride 117 (H) 98 - 111 mmol/L   CO2 19 (L) 22 - 32 mmol/L   Glucose, Bld 90 70 - 99 mg/dL    Comment: Glucose reference range applies only to samples taken after fasting for at least 8 hours.   BUN 38 (H) 6 - 20 mg/dL   Creatinine, Ser 1.71 (H) 0.61 - 1.24 mg/dL   Calcium 9.8 8.9 - 10.3 mg/dL   GFR, Estimated 46 (L) >60 mL/min    Comment: (NOTE) Calculated using the CKD-EPI Creatinine Equation (2021)    Anion gap 11 5 - 15    Comment: Performed at [facility], [place] [address], [place], [place] [address]  Ammonia     Status: None   Collection Time: 10/09/20 11:32 PM  Result Value Ref Range   Ammonia 21 9 - 35 umol/L    Comment: Performed at [facility], [place] [address], [place], [place] [address]  Magnesium     Status: None   Collection Time: 10/09/20 11:32 PM  Result Value Ref Range   Magnesium 1.8 1.7 - 2.4 mg/dL    Comment: Performed at [facility], [place] [address], [place], [place] [address]  Phosphorus     Status: None   Collection  Time: 10/09/20 11:32 PM  Result Value Ref Range   Phosphorus 3.6 2.5 - 4.6 mg/dL    Comment: Performed at [facility], [place] [address] [place], [place] [address]  CK     Status: None   Collection Time: 10/09/20 11:32 PM  Result Value Ref Range   Total CK 74 49 - 397 U/L    Comment: Performed at  [facility], [place] [address] [place], [place] [address]  Vitamin B12     Status: Abnormal   Collection Time: 10/09/20 11:32 PM  Result Value Ref Range   Vitamin B-12 111 (L) 180 - 914 pg/mL    Comment: (NOTE) This assay is not validated for testing neonatal or myeloproliferative syndrome specimens for Vitamin B12 levels. Performed at [facility], [place] [address] [place], [place] [address]   Folate     Status: None   Collection Time: 10/09/20 11:32 PM  Result Value Ref Range   Folate 22.3 >5.9 ng/mL    Comment: Performed at [facility], [place] [address], [place], [place] [address]  Iron and TIBC     Status: None   Collection Time: 10/09/20 11:32 PM  Result Value Ref Range   Iron 73 45 - 182 ug/dL   TIBC 280 250 - 450 ug/dL   Saturation Ratios 26 17.9 - 39.5 %   UIBC 207 ug/dL    Comment: Performed at [facility], [place] [address] [place], [place] [address]  Ferritin     Status: None   Collection Time: 10/09/20 11:32 PM  Result Value Ref Range   Ferritin 234 24 - 336 ng/mL    Comment: Performed at [facility], [place] [address], [place], [place] [address]  Reticulocytes     Status: Abnormal   Collection Time: 10/09/20 11:32 PM  Result Value Ref Range   Retic Ct Pct 4.2 (H) 0.4 - 3.1 %   RBC. 2.95 (L) 4.22 - 5.81 MIL/uL   Retic Count, Absolute 123.6 19.0 - 186.0 K/uL   Immature Retic Fract 32.6 (H) 2.3 - 15.9 %    Comment: Performed at [facility], [place] [address] [place], [place] [address]  Blood gas, venous     Status: Abnormal   Collection Time:  10/09/20 11:32 PM  Result Value Ref Range   pH, Ven 7.328 7.250 - 7.430   pCO2, Ven 38.4 (L) 44.0 - 60.0 mmHg   pO2, Ven 20.8 (LL) 32.0 - 45.0 mmHg    Comment: CRITICAL RESULT CALLED TO, READ BACK BY AND VERIFIED WITH: SYQUIA I @0013  BY BATTLET    Bicarbonate 19.6 (L) 20.0 - 28.0 mmol/L   Acid-base deficit 5.5 (H) 0.0 - 2.0 mmol/L   O2 Saturation 21.2 %   Patient temperature 98.6     Comment: Performed at [facility], [place] [address] [place], [place] [address]  Osmolality     Status: Abnormal   Collection Time: 10/09/20 11:32 PM  Result Value Ref Range   Osmolality 316 (H) 275 - 295 mOsm/kg    Comment: Performed at [facility], [place] [address], [place],  [address]  Procalcitonin - Baseline     Status: None   Collection Time: 10/09/20 11:32 PM  Result Value Ref Range   Procalcitonin <0.10 ng/mL    Comment:        Interpretation: PCT (Procalcitonin) <= 0.5 ng/mL: Systemic infection (sepsis) is not likely. Local bacterial infection is possible. (NOTE)       Sepsis PCT Algorithm           Lower Respiratory Tract                                      Infection PCT Algorithm    ----------------------------     ----------------------------  PCT < 0.25 ng/mL                PCT < 0.10 ng/mL          Strongly encourage             Strongly discourage   discontinuation of antibiotics    initiation of antibiotics    ----------------------------     -----------------------------       PCT 0.25 - 0.50 ng/mL            PCT 0.10 - 0.25 ng/mL               OR       >80% decrease in PCT            Discourage initiation of                                            antibiotics      Encourage discontinuation           of antibiotics    ----------------------------     -----------------------------         PCT >= 0.50 ng/mL              PCT 0.26 - 0.50 ng/mL               AND        <80% decrease in PCT             Encourage initiation of                                              antibiotics       Encourage continuation           of antibiotics    ----------------------------     -----------------------------        PCT >= 0.50 ng/mL                  PCT > 0.50 ng/mL               AND         increase in PCT                  Strongly encourage                                      initiation of antibiotics    Strongly encourage escalation           of antibiotics                                     -----------------------------                                           PCT <= 0.25 ng/mL  OR                                        > 80% decrease in PCT                                      Discontinue / Do not initiate                                             antibiotics  Performed at [place] [address], [place], [place] [address] CORRECTED ON 10/02 AT 0841: PREVI OUSLY REPORTED AS <0.10        Interpretation: PCT (Procalcitonin) <= 0.5 ng/mL: Systemic infection (sepsis) is not likely. Local bacterial infection is possible.   Vancomycin, random     Status: None   Collection Time: 10/09/20 11:35 PM  Result Value Ref Range   Vancomycin Rm <0     Comment: Performed at [facility], [place] [address] [place], [place] [address]  Procalcitonin     Status: None   Collection Time: 10/10/20  3:08 AM  Result Value Ref Range   Procalcitonin <0.10 ng/mL    Comment:        Interpretation: PCT (Procalcitonin) <= 0.5 ng/mL: Systemic infection (sepsis) is not likely. Local bacterial infection is possible. (NOTE)       Sepsis PCT Algorithm           Lower Respiratory Tract                                      Infection PCT Algorithm    ----------------------------     ----------------------------         PCT < 0.25 ng/mL                PCT < 0.10 ng/mL          Strongly encourage             Strongly discourage   discontinuation of antibiotics     initiation of antibiotics    ----------------------------     -----------------------------       PCT 0.25 - 0.50 ng/mL            PCT 0.10 - 0.25 ng/mL               OR       >80% decrease in PCT            Discourage initiation of                                            antibiotics      Encourage discontinuation           of antibiotics    ----------------------------     -----------------------------         PCT >= 0.50 ng/mL              PCT 0.26 - 0.50 ng/mL  AND        <80% decrease in PCT             Encourage initiation of                                             antibiotics       Encourage continuation           of antibiotics    ----------------------------     -----------------------------        PCT >= 0.50 ng/mL                  PCT > 0.50 ng/mL               AND         increase in PCT                  Strongly encourage                                      initiation of antibiotics    Strongly encourage escalation           of antibiotics                                     -----------------------------                                           PCT <= 0.25 ng/mL                                                 OR                                        > 80% decrease in PCT                                      Discontinue / Do not initiate                                             antibiotics  Performed at [place] [address], [place], [place] [address]   CK     Status: None   Collection Time: 10/10/20  3:08 AM  Result Value Ref Range   Total CK 56 49 - 397 U/L    Comment: Performed at [facility], [place] [address], [place],  [address]  Magnesium     Status: Abnormal   Collection Time: 10/10/20  3:08 AM  Result Value Ref Range   Magnesium 2.5 (H) 1.7 - 2.4 mg/dL    Comment: Performed at [facility], [place] [address], [place], [place] [address]  Phosphorus     Status:  None   Collection Time: 10/10/20  3:08 AM  Result Value Ref Range   Phosphorus 3.3 2.5 - 4.6 mg/dL    Comment: Performed at [facility], [place] [address], [place], [place] [address]  CBC WITH DIFFERENTIAL     Status: Abnormal   Collection Time: 10/10/20  3:08 AM  Result Value Ref Range   WBC 7.3 4.0 - 10.5 K/uL   RBC 2.65 (L) 4.22 - 5.81 MIL/uL   Hemoglobin 8.9 (L) 13.0 - 17.0 g/dL   HCT 27.6 (L) 39.0 - 52.0 %   MCV 104.2 (H) 80.0 - 100.0 fL   MCH 33.6 26.0 - 34.0 pg   MCHC 32.2 30.0 - 36.0 g/dL   RDW 15.8 (H) 11.5 - 15.5 %   Platelets 132 (L) 150 - 400 K/uL   nRBC 1.2 (H) 0.0 - 0.2 %   Neutrophils Relative % 68 %   Neutro Abs 4.9 1.7 - 7.7 K/uL   Lymphocytes Relative 16 %   Lymphs Abs 1.2 0.7 - 4.0 K/uL   Monocytes Relative 8 %   Monocytes Absolute 0.6 0.1 - 1.0 K/uL   Eosinophils Relative 2 %   Eosinophils Absolute 0.1 0.0 - 0.5 K/uL   Basophils Relative 0 %   Basophils Absolute 0.0 0.0 - 0.1 K/uL   WBC Morphology MILD LEFT SHIFT (1-5% METAS, OCC MYELO, OCC BANDS)    Immature Granulocytes 6 %   Abs Immature Granulocytes 0.45 (H) 0.00 - 0.07 K/uL   Reactive, Benign Lymphocytes PRESENT    Polychromasia PRESENT     Comment: Performed at [facility], [place] [address], [place], [place] [address]  TSH     Status: None   Collection Time: 10/10/20  3:08 AM  Result Value Ref Range   TSH 3.908 0.350 - 4.500 uIU/mL    Comment: Performed by a 3rd Generation assay with a functional sensitivity of <=0.01 uIU/mL. Performed at [facility], [place] [address], [place], [place] [address]   Comprehensive metabolic panel     Status: Abnormal   Collection Time: 10/10/20  3:08 AM  Result Value Ref Range   Sodium 147 (H) 135 - 145 mmol/L   Potassium 3.6 3.5 - 5.1 mmol/L   Chloride 118 (H) 98 - 111 mmol/L   CO2 21 (L) 22 - 32 mmol/L   Glucose, Bld 100 (H) 70 - 99 mg/dL    Comment: Glucose reference range applies only to samples taken  after fasting for at least 8 hours.   BUN 42 (H) 6 - 20 mg/dL   Creatinine, Ser 1.70 (H) 0.61 - 1.24 mg/dL   Calcium 9.4 8.9 - 10.3 mg/dL   Total Protein 5.8 (L) 6.5 - 8.1 g/dL   Albumin 2.8 (L) 3.5 - 5.0 g/dL   AST 26 15 - 41 U/L   ALT 33 0 - 44 U/L   Alkaline Phosphatase 32 (L) 38 - 126 U/L   Total Bilirubin 0.5 0.3 - 1.2 mg/dL   GFR, Estimated 46 (L) >60 mL/min    Comment: (NOTE) Calculated using the CKD-EPI Creatinine Equation (2021)    Anion gap 8 5 - 15    Comment: Performed at [facility], [place] [address] [place], [place] [address]    Time spent: Greater than 50% of the 35 minute visit was spent in counseling/coordination of care for the patient as laid  out in the A&P.  Dwyane Dee, MD [facility] 10/10/2020, 11:10 AM

## 2020-10-10 NOTE — Progress Notes (Signed)
Pharmacy Antibiotic Note  [name] [name] is a 60 y.o. 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  Pharmacy has been consulted for Zosyn and Vancomycin dosing.  Zosyn noted on MAR from SNF, unable to determine if currently onl Vancomycin from SNF information.   [facility] office note 9/8 stated on Vanc/Zosyn and planned 6 wk course, with follow up at [facility] in one month, and stop Levaquin while on IV abx. Confusion noted after that visit, 9/26 [facility] office communication noted still on Vanc/Zosyn.   10/10/20 Random Vanc level drawn @ 23:35 = <0  Plan: Zosyn 3.375gm q8 - 4 hr infusion Vancomycin 1250mg  IV x 1 dose now followed by Vancomycin 750 mg IV Q 24 hrs. Goal AUC 400-550. Expected AUC: 528.9 SCr used: 1.76 Daily SCr monitoring while on Vanc and Zosyn   Height: 6\' 2"  (188 cm) Weight: 56.6 kg (124 lb 12.5 oz) IBW/kg (Calculated) : 82.2  Temp (24hrs), Avg:98.5 F (36.9 C), Min:98.4 F (36.9 C), Max:98.6 F (37 C)  Recent Labs  Lab 10/09/20 1452 10/09/20 2335  WBC 10.3  --   CREATININE 1.76*  --   LATICACIDVEN 1.6  --   VANCORANDOM  --  <0     Estimated Creatinine Clearance: 36.6 mL/min (A) (by C-G formula based on SCr of 1.76 mg/dL (H)).    Allergies  Allergen Reactions   Collagen Rash    redness    Antimicrobials this admission: 10/1 Zosyn >>  10/2 Vancomycin >>   Dose adjustments this admission:  Microbiology results: 10/1 BCx: sent 10/1 UCx: sent  Thank you for allowing pharmacy to be a part of this patient's care.  Everette Rank PharmD 10/10/2020 1:23 AM

## 2020-10-11 ENCOUNTER — Inpatient Hospital Stay (HOSPITAL_COMMUNITY)
Admit: 2020-10-11 | Discharge: 2020-10-11 | Disposition: A | Discharge status: Home / Self Care | Payer: Medicare Other | Attending: Internal Medicine | Admitting: Internal Medicine

## 2020-10-11 DIAGNOSIS — D649 Anemia, unspecified: Secondary | ICD-10-CM

## 2020-10-11 DIAGNOSIS — R532 Functional quadriplegia: Secondary | ICD-10-CM

## 2020-10-11 DIAGNOSIS — E870 Hyperosmolality and hypernatremia: Secondary | ICD-10-CM

## 2020-10-11 DIAGNOSIS — B20 Human immunodeficiency virus [HIV] disease: Secondary | ICD-10-CM | POA: Diagnosis not present

## 2020-10-11 DIAGNOSIS — G9340 Encephalopathy, unspecified: Secondary | ICD-10-CM

## 2020-10-11 DIAGNOSIS — M4628 Osteomyelitis of vertebra, sacral and sacrococcygeal region: Secondary | ICD-10-CM

## 2020-10-11 DIAGNOSIS — G9341 Metabolic encephalopathy: Secondary | ICD-10-CM | POA: Diagnosis not present

## 2020-10-11 DIAGNOSIS — E43 Unspecified severe protein-calorie malnutrition: Secondary | ICD-10-CM

## 2020-10-11 DIAGNOSIS — N179 Acute kidney failure, unspecified: Secondary | ICD-10-CM | POA: Diagnosis not present

## 2020-10-11 LAB — CBC WITH DIFFERENTIAL/PLATELET
Abs Immature Granulocytes: 0.36 10*3/uL — ABNORMAL HIGH (ref 0.00–0.07)
Basophils Absolute: 0 10*3/uL (ref 0.0–0.1)
Basophils Relative: 0 %
Eosinophils Absolute: 0.2 10*3/uL (ref 0.0–0.5)
Eosinophils Relative: 3 %
HCT: 27.7 % — ABNORMAL LOW (ref 39.0–52.0)
Hemoglobin: 9.1 g/dL — ABNORMAL LOW (ref 13.0–17.0)
Immature Granulocytes: 5 %
Lymphocytes Relative: 18 %
Lymphs Abs: 1.4 10*3/uL (ref 0.7–4.0)
MCH: 34.1 pg — ABNORMAL HIGH (ref 26.0–34.0)
MCHC: 32.9 g/dL (ref 30.0–36.0)
MCV: 103.7 fL — ABNORMAL HIGH (ref 80.0–100.0)
Monocytes Absolute: 0.7 10*3/uL (ref 0.1–1.0)
Monocytes Relative: 8 %
Neutro Abs: 5.2 10*3/uL (ref 1.7–7.7)
Neutrophils Relative %: 66 %
Platelets: 125 10*3/uL — ABNORMAL LOW (ref 150–400)
RBC: 2.67 MIL/uL — ABNORMAL LOW (ref 4.22–5.81)
RDW: 15.5 % (ref 11.5–15.5)
WBC: 7.8 10*3/uL (ref 4.0–10.5)
nRBC: 0.4 % — ABNORMAL HIGH (ref 0.0–0.2)

## 2020-10-11 LAB — BASIC METABOLIC PANEL
Anion gap: 4 — ABNORMAL LOW (ref 5–15)
BUN: 36 mg/dL — ABNORMAL HIGH (ref 6–20)
CO2: 21 mmol/L — ABNORMAL LOW (ref 22–32)
Calcium: 9.2 mg/dL (ref 8.9–10.3)
Chloride: 117 mmol/L — ABNORMAL HIGH (ref 98–111)
Creatinine, Ser: 1.87 mg/dL — ABNORMAL HIGH (ref 0.61–1.24)
GFR, Estimated: 41 mL/min — ABNORMAL LOW (ref >60)
Glucose, Bld: 87 mg/dL (ref 70–99)
Potassium: 3.4 mmol/L — ABNORMAL LOW (ref 3.5–5.1)
Sodium: 142 mmol/L (ref 135–145)

## 2020-10-11 LAB — HIV-1 RNA QUANT-NO REFLEX-BLD
HIV 1 RNA Quant: 120 copies/mL
LOG10 HIV-1 RNA: 2.079 log10copy/mL

## 2020-10-11 LAB — MAGNESIUM: Magnesium: 1.9 mg/dL (ref 1.7–2.4)

## 2020-10-11 LAB — T-HELPER CELLS (CD4) COUNT (NOT AT ARMC)
CD4 % Helper T Cell: 13 % — ABNORMAL LOW (ref 33–65)
CD4 T Cell Abs: 167 /uL — ABNORMAL LOW (ref 400–1790)

## 2020-10-11 LAB — URINE CULTURE: Culture: NO GROWTH

## 2020-10-11 LAB — CK: Total CK: 42 U/L — ABNORMAL LOW (ref 49–397)

## 2020-10-11 LAB — PROCALCITONIN: Procalcitonin: <0.1 ng/mL

## 2020-10-11 MED ORDER — SODIUM CHLORIDE 0.9% FLUSH: 10.0000 mL | INTRAVENOUS | Status: DC | PRN

## 2020-10-11 MED ORDER — ADULT MULTIVITAMIN W/MINERALS CH
1.0000 | ORAL_TABLET | Freq: Every day | ORAL | Status: DC
  Administered 2020-10-11 – 2020-10-14 (×4): 1 via ORAL
  Filled 2020-10-11 (×4): qty 1

## 2020-10-11 MED ORDER — SODIUM CHLORIDE 0.9% FLUSH
10.0000 mL | Freq: Two times a day (BID) | INTRAVENOUS | Status: DC
  Administered 2020-10-11: 20 mL
  Administered 2020-10-11 – 2020-10-14 (×5): 10 mL

## 2020-10-11 MED ORDER — ENSURE ENLIVE PO LIQD
237.0000 mL | Freq: Three times a day (TID) | ORAL | Status: DC
  Administered 2020-10-11 – 2020-10-14 (×8): 237 mL via ORAL

## 2020-10-11 MED ORDER — AMOXICILLIN-POT CLAVULANATE 875-125 MG PO TABS
1.0000 | ORAL_TABLET | Freq: Two times a day (BID) | ORAL | Status: DC
  Administered 2020-10-11 – 2020-10-14 (×7): 1 via ORAL
  Filled 2020-10-11 (×7): qty 1

## 2020-10-11 MED ORDER — THIAMINE HCL 100 MG PO TABS
100.0000 mg | ORAL_TABLET | Freq: Every day | ORAL | Status: DC
  Administered 2020-10-12 – 2020-10-14 (×3): 100 mg via ORAL
  Filled 2020-10-11 (×3): qty 1

## 2020-10-11 MED ORDER — DOXYCYCLINE HYCLATE 100 MG PO TABS
100.0000 mg | ORAL_TABLET | Freq: Two times a day (BID) | ORAL | Status: DC
  Administered 2020-10-11 – 2020-10-14 (×7): 100 mg via ORAL
  Filled 2020-10-11 (×7): qty 1

## 2020-10-11 NOTE — Evaluation (Signed)
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    HIV (human immunodeficiency virus infection) ([place])    Hx of sepsis 2015   affected rt hip / femur   Pain in limb 07/23/2013   PONV (postoperative nausea and vomiting)    Hiccups for 4 days after anesthesia   Radiation    Rectal cancer (HCC)    Squamous cell   Shingles    left shoulder, right leg   Past Surgical History:  Past Surgical History:  Procedure Laterality Date   COLONOSCOPY     CONVERSION TO TOTAL HIP Right 06/15/2014   Procedure: REMOVAL OF ANTIBIOTIC  SPACER AND CONVERSION TO RIGHT TOTAL HIP  ARTHROPLASTY ;  Surgeon: Paralee Cancel, MD;  Location: [facility];  Service: Orthopedics;  Laterality: Right;   CYSTOSCOPY     HEMATOMA EVACUATION     HERNIA REPAIR     right X2   INCISION AND DRAINAGE HIP Right 07/31/2013   Procedure: IRRIGATION AND DEBRIDEMENT HIP WITH PERCUTANEOUS DRAIN PLACEMENT;  Surgeon: [name], MD;  Location: [place];  Service: Orthopedics;  Laterality: Right;   INCISION AND DRAINAGE HIP Right 03/15/2015   Procedure: IRRIGATION AND DEBRIDEMENT RIGHT HIP WITH  HEAD BALL  AND POLY LINER EXCHANGE;  Surgeon: Paralee Cancel, MD;  Location: [facility];  Service: Orthopedics;  Laterality: Right;   INCISION AND DRAINAGE OF WOUND N/A 08/25/2013   Procedure: IRRIGATION AND DEBRIDEMENT OF SACRAL ULCER WITH PLACEMENT OF A CELL ;  Surgeon: Theodoro Kos, DO;  Location: [place];  Service:  Plastics;  Laterality: N/A;   INCISION AND DRAINAGE OF WOUND N/A 09/01/2013   Procedure: IRRIGATION AND DEBRIDEMENT SACRAL ULCER WITH PLACEMENT OF A CELL;  Surgeon: Theodoro Kos, DO;  Location: [place];  Service: Plastics;  Laterality: N/A;   INCISION AND DRAINAGE OF WOUND N/A 09/08/2013   Procedure: IRRIGATION AND DEBRIDEMENT OF SACRAL ULCER WITH PLACEMENT OF A CELL AND VAC;  Surgeon: Theodoro Kos, DO;  Location: [place];  Service: Plastics;  Laterality: N/A;   INCISION AND DRAINAGE OF WOUND N/A 09/18/2013   Procedure: IRRIGATION AND DEBRIDEMENT OF SACRAL WOUND WITH PLACEMENT OF A-CELL;  Surgeon: Theodoro Kos, DO;  Location: [place];  Service: Plastics;  Laterality: N/A;   PILONIDAL CYST DRAINAGE N/A 08/09/2013   Procedure: IRRIGATION AND DEBRIDEMENT SACRAL DECUBITUS;  Surgeon: Harl Bowie, MD;  Location: [place];  Service: General;  Laterality: N/A;   RECTAL BIOPSY N/A 03/29/2020   Procedure: EXCISION OF PERIANAL TISSUE WITH BX AND 3 PUNCH BXS;  Surgeon: Ileana Roup, MD;  [facility];  Service: General;  Laterality: N/A;   TOTAL HIP ARTHROPLASTY Right 08/04/2013   Procedure: RESECTION HIP JOINT - PLACEMENT OF CEMENT PROSTHESIS;  Surgeon: [name], MD;  Location: [facility];  Service: Orthopedics;  Laterality: Right;   TRANSURETHRAL RESECTION OF  PROSTATE  JAN 2016   HPI:  60yo male admitted 10/09/20 from [facility] with confusion. 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 CXR = no active disease    Assessment / Plan / Recommendation  Clinical Impression  Pt seen at bedside for assessment of swallow function and safety. RN was initially present, and provided pt with crushed meds in ice cream. This was tolerated well by the patient. Pt unable/unwilling to  participate in CN examination, however, no obvious weakness or symmetry was observed. His speech is fully intelligible. He appears to have natural dentition, however, pt would not follow directions for full exam of oral cavity. Pt accepted boluses from breakfast tray, including eggs, grits, and milk. Extended oral prep noted, possibly due to missing dentition. No overt s/s aspiration observed, even following consecutive boluses of thin liquid. Will continue dys 3/thin liquid diet with crushed meds. Safe swallow precautions posted at [facility]. No further ST intervention recommended at this time. Please reconsult if needs arise.  SLP Visit Diagnosis: Dysphagia, unspecified (R13.10)    Aspiration Risk  Mild aspiration risk    Diet Recommendation Dysphagia 3 (Mech soft);Thin liquid   Liquid Administration via: Straw;Cup Medication Administration: Crushed with puree Supervision: Full supervision/cueing for compensatory strategies;Staff to assist with self feeding Compensations: Minimize environmental distractions;Slow rate;Small sips/bites Postural Changes: Seated upright at 90 degrees;Remain upright for at least 30 minutes after po intake    Other  Recommendations Oral Care Recommendations: Oral care BID    Recommendations for follow up therapy are one component of a multi-disciplinary discharge planning process, led by the attending physician.  Recommendations may be updated based on patient status, additional functional criteria and insurance authorization.  Follow up Recommendations 24 hour supervision/assistance          Prognosis Prognosis for Safe Diet Advancement: Fair Barriers to Reach Goals: Cognitive deficits      Swallow Study   General Date of Onset: 10/09/20 HPI: 60yo male admitted 10/09/20 from [facility] with confusion. 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 CXR = no active disease Type of Study: Bedside Swallow Evaluation Previous Swallow Assessment: BSE 05/18/20, followed until 05/27/20, adv to Dys3/thin. Diet Prior to this Study: Dysphagia 3 (soft);Thin liquids Temperature Spikes Noted: No Respiratory Status: Room air History of Recent Intubation: No Behavior/Cognition: Alert;Confused;Cooperative;Requires cueing Oral Cavity Assessment: Within Functional Limits Oral Care Completed by SLP: No Oral Cavity - Dentition: Adequate natural dentition Vision: Functional for self-feeding Self-Feeding Abilities: Total assist Patient Positioning: Upright in bed Baseline Vocal Quality: Normal Volitional Cough: Cognitively unable to elicit Volitional Swallow: Unable to elicit    Oral/Motor/Sensory Function Overall Oral Motor/Sensory Function: Within functional limits   Ice Chips Ice chips: Not tested   Thin Liquid Thin Liquid: Within functional limits Presentation: Straw    Nectar Thick Nectar Thick Liquid: Not tested   Honey Thick Honey Thick Liquid: Not tested   Puree Puree: Within functional limits Presentation: Spoon   Solid     Solid: Within functional limits Presentation: [place] Quentin Ore, [facility], [place] Speech Language Pathologist Office: [phone]  Shonna Chock 10/11/2020,10:22 AM

## 2020-10-11 NOTE — Progress Notes (Signed)
EEG complete - results pending 

## 2020-10-11 NOTE — Consult Note (Signed)
WOC Nurse Consult Note: Reason for Consult: multiple wounds Long standing history of sacral pressure injury; +osteomyelitis. Chronic non healing wounds of the left lateral and right lateral foot.   Wound type: Sacrum: Healing Stage 3 Pressure Injury; 4cm x 5cm x 0.1cm; 100% pink, moist Left lateral foot: full thickness wound with callous skin and purulent thick yellow drainage with palpation  Right lateral foot: full thickness wound; 0.3cm x 0.2cmx 0.3cm; pale, pink Right lateral heel; full thickness wound; 2cm x 2cm x 0.1cm; pale; white Pressure Injury POA: Yes Measurement:see above  Wound bed: see above  Drainage (amount, consistency, odor) scant drainage from the right foot wound; see above thick yellow purulent drainage from the left lateral foot wound; moderate yellow drainage from the sacrum/no odor Periwound:intact Dressing procedure/placement/frequency: LALM while in the ICU, orders entered to order air mattress if patient transfers from the ICU Silver hydrofiber to the left and right lateral foot wounds; change every other day Silicone foam to the right lateral heel wound and to the left heel for protection; change every 3 days  Prevalon boots bilaterally to offload heels  Silver hydrofiber to the sacrum; top with foam. Change every other day and PRN soilage   Discussed POC with patient and bedside nurse.  Re consult if needed, will not follow at this time. Thanks  Clydell Sposito [employer], RN,CWOCN, CNS, [place] [phone])

## 2020-10-11 NOTE — Progress Notes (Signed)
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 He has had 4 weeks iv abx now. No previous cx sent. Reasonable now to finish 6 week course with oral doxy/amox-clav. These chronic decub ulcer pelvis OM cases are challenging as patient usually returns with new infection due to inability to reverse underlying process (inability to close wound/have colonic diversion, and close wound care)  Abx vanc/piptazo started 9/03 with picc placed at his SNF. Will change to doxy/augmentin as mentioned to finish on 10/17  Aids: Lab Results  Component Value Date   HIV1RNAQUANT 120 10/10/2020   Lab Results  Component Value Date   CD4TCELL 13 (L) 10/10/2020   CD4TABS 167 (L) 10/10/2020   Doesn't appear he is on prophy bactrim any longer He has had viral load < 200 since 06/2020 ART reflects MDR hiv virus Will continue current ART and have him f/u dr [name]     PLAN: Change dapto/piptazo to doxy/amox-clav to finish on 10/17 Continue prezcobix/rilpivirine/tivicay Remove picc Follow up with dr [name] on 10/19 @ 245 at [facility] ID will sign off Discussed with primary team  I spent more than 35 minute reviewing data/chart, and coordinating care and >50% direct face to face time providing counseling/discussing diagnostics/treatment plan with patient   Active Problems:   Human immunodeficiency virus (HIV) disease ([place])   Unspecified severe  protein-calorie malnutrition ([place])   AKI (acute kidney injury) ([place])   Acute metabolic encephalopathy   Sacral osteomyelitis ([place])   Chronic diastolic CHF (congestive heart failure) (HCC)   Anemia   Rigidity   Hypernatremia   Functional quadriplegia (HCC)   Pressure injury of skin   Acute encephalopathy   Allergies  Allergen Reactions   Collagen Rash    redness    Scheduled Meds:  amoxicillin-clavulanate  1 tablet Oral Q12H   baclofen  5 mg Oral BID   chlorhexidine gluconate (MEDLINE KIT)  15 mL Mouth Rinse BID   Chlorhexidine Gluconate Cloth  6 each Topical Q0600   cyanocobalamin  1,000 mcg Intramuscular Daily   Followed by   Derrill Memo ON 10/17/2020] vitamin B-12  1,000 mcg Oral Daily   darunavir-cobicistat  1 tablet Oral Q breakfast   dolutegravir  50 mg Oral Daily   doxycycline  100 mg Oral Q12H   feeding supplement  237 mL Oral TID BM   mouth rinse  15 mL Mouth Rinse q12n4p   megestrol  800 mg Oral Daily   multivitamin with minerals  1 tablet Oral Daily   mupirocin ointment  1 application Nasal BID   nystatin  5 mL Mouth/Throat QID   rilpivirine  25 mg Oral Q breakfast   sodium chloride flush  10-40 mL Intracatheter Q12H   [START ON 10/12/2020] thiamine  100 mg Oral Daily   Continuous Infusions:  sodium chloride 10 mL/hr at 10/11/20 1400   thiamine injection Stopped (10/11/20 1027)   PRN Meds:.sodium chloride, acetaminophen **OR** acetaminophen, loperamide, sodium chloride flush   SUBJECTIVE: Doing better per nursing  staff Patient without complaint. Minimal verbal. I am unclear of his baseline No fever, chill No n/v  Review of Systems: ROS All other ROS was negative, except mentioned above     OBJECTIVE: Vitals:   10/11/20 0600 10/11/20 0800 10/11/20 1200 10/11/20 1400  BP: (!) 141/89 (!) 123/106  129/88  Pulse: 65 (!) 58  74  Resp: 19 12  (!) 21  Temp:  98.5 F (36.9 C) 97.9 F (36.6 C)   TempSrc:  Oral Oral   SpO2: 99% 100%  100%  Weight:       Height:       Body mass index is 16.02 kg/m.  Physical Exam General/constitutional: no distress, nonverbal; chornically ill appearing HEENT: Normocephalic, PER, Conj Clear, EOMI, Oropharynx clear Neck supple CV: rrr no mrg Lungs: clear to auscultation, normal respiratory effort Abd: Soft, Nontender Ext: no edema Skin/gu: sacral decub stage 3 about 3 inch in diameter with 1 small spot deep tracking; clean base no purulence; scattered bilateral feet small ulcer/eschars Neuro: rigid/stiff; minimal volitional movement MSK: no peripheral joint swelling/tenderness/warmth; back spines nontender   Central line presence: yes rue picc site no erythema/purulence   Lab Results Lab Results  Component Value Date   WBC 7.8 10/11/2020   HGB 9.1 (L) 10/11/2020   HCT 27.7 (L) 10/11/2020   MCV 103.7 (H) 10/11/2020   PLT 125 (L) 10/11/2020    Lab Results  Component Value Date   CREATININE 1.87 (H) 10/11/2020   BUN 36 (H) 10/11/2020   NA 142 10/11/2020   K 3.4 (L) 10/11/2020   CL 117 (H) 10/11/2020   CO2 21 (L) 10/11/2020    Lab Results  Component Value Date   ALT 33 10/10/2020   AST 26 10/10/2020   ALKPHOS 32 (L) 10/10/2020   BILITOT 0.5 10/10/2020      Microbiology: Recent Results (from the past 240 hour(s))  Urine Culture     Status: None   Collection Time: 10/09/20  6:00 PM   Specimen: In/Out Cath Urine  Result Value Ref Range Status   Specimen Description   Final    IN/OUT CATH URINE Performed at [facility], [place] [address] [place], [place] [address]    Special Requests   Final    NONE Performed at [facility], [place] [address] [place], [place] [address]    Culture   Final    NO GROWTH Performed at [facility], [place] [address], [place], [place] [address]    Report Status 10/11/2020 FINAL  Final  Blood Culture (routine x 2)     Status: None (Preliminary result)   Collection Time: 10/09/20  6:10 PM   Specimen: BLOOD  RIGHT HAND  Result Value Ref Range Status   Specimen Description   Final    BLOOD RIGHT HAND Performed at [facility], [place] [address], [place], [place] [address]    Special Requests   Final    BOTTLES DRAWN AEROBIC AND ANAEROBIC Blood Culture results may not be optimal due to an inadequate volume of blood received in culture bottles Performed at [place] [address] [place], [place] [address]    Culture   Final    NO GROWTH 2 DAYS Performed at [place] [address], [place], [place] [address]    Report Status PENDING  Incomplete  Resp Panel by RT-PCR (Flu A&B, Covid) Nasopharyngeal Swab     Status: None  Collection Time: 10/09/20  6:25 PM   Specimen: Nasopharyngeal Swab; Nasopharyngeal(NP) swabs in vial transport medium  Result Value Ref Range Status   SARS Coronavirus 2 by RT PCR NEGATIVE NEGATIVE Final    Comment: (NOTE) SARS-CoV-2 target nucleic acids are NOT DETECTED.  The SARS-CoV-2 RNA is generally detectable in upper respiratory specimens during the acute phase of infection. The lowest concentration of SARS-CoV-2 viral copies this assay can detect is 138 copies/mL. A negative result does not preclude SARS-Cov-2 infection and should not be used as the sole basis for treatment or other patient management decisions. A negative result may occur with  improper specimen collection/handling, submission of specimen other than nasopharyngeal swab, presence of viral mutation(s) within the areas targeted by this assay, and inadequate number of viral copies(<138 copies/mL). A negative result must be combined with clinical observations, patient history, and epidemiological information. The expected result is Negative.  Fact Sheet for Patients:  https://www.fda.gov/media/152166/download  Fact Sheet for Healthcare Providers:  https://www.fda.gov/media/152162/download  This test is no t yet approved or cleared by the [place] FDA  and  has been authorized for detection and/or diagnosis of SARS-CoV-2 by FDA under an Emergency Use Authorization (EUA). This EUA will remain  in effect (meaning this test can be used) for the duration of the COVID-19 declaration under Section 564(b)(1) of the Act, 21 U.S.C.section 360bbb-3(b)(1), unless the authorization is terminated  or revoked sooner.       Influenza A by PCR NEGATIVE NEGATIVE Final   Influenza B by PCR NEGATIVE NEGATIVE Final    Comment: (NOTE) The Xpert Xpress SARS-CoV-2/FLU/RSV plus assay is intended as an aid in the diagnosis of influenza from Nasopharyngeal swab specimens and should not be used as a sole basis for treatment. Nasal washings and aspirates are unacceptable for Xpert Xpress SARS-CoV-2/FLU/RSV testing.  Fact Sheet for Patients: https://www.fda.gov/media/152166/download  Fact Sheet for Healthcare Providers: https://www.fda.gov/media/152162/download  This test is not yet approved or cleared by the [place] FDA and has been authorized for detection and/or diagnosis of SARS-CoV-2 by FDA under an Emergency Use Authorization (EUA). This EUA will remain in effect (meaning this test can be used) for the duration of the COVID-19 declaration under Section 564(b)(1) of the Act, 21 U.S.C. section 360bbb-3(b)(1), unless the authorization is terminated or revoked.  Performed at [facility], [address] [place], [place] [address]   MRSA Next Gen by PCR, Nasal     Status: Abnormal   Collection Time: 10/09/20 10:50 PM   Specimen: Nasal Mucosa; Nasal Swab  Result Value Ref Range Status   MRSA by PCR Next Gen DETECTED (A) NOT DETECTED Final    Comment: RESULT CALLED TO, READ BACK BY AND VERIFIED WITH: Candace Williams, RN 10/10/20 0743 KDS (NOTE) The GeneXpert MRSA Assay (FDA approved for NASAL specimens only), is one component of a comprehensive MRSA colonization surveillance program. It is not intended to diagnose MRSA  infection nor to guide or monitor treatment for MRSA infections. Test performance is not FDA approved in patients less than 2 years old. Performed at [facility], [address] [place], [place] [address]   Blood Culture (routine x 2)     Status: None (Preliminary result)   Collection Time: 10/09/20 11:32 PM   Specimen: BLOOD  Result Value Ref Range Status   Specimen Description   Final    BLOOD LEFT ANTECUBITAL Performed at [facility], [address] [place], [place] [address]    Special Requests     Final    BOTTLES DRAWN AEROBIC AND ANAEROBIC Blood Culture adequate volume Performed at [place] [address], [place], [place] [address]    Culture   Final    NO GROWTH 1 DAY Performed at [facility], [place] [address], [place], [place] [address]    Report Status PENDING  Incomplete     Serology:   Imaging: If present, new imagings (plain films, ct scans, and mri) have been personally visualized and interpreted; radiology reports have been reviewed. Decision making incorporated into the Impression / Recommendations.   Jabier Mutton, [place] for Infectious [place] [phone] pager    10/11/2020, 3:33 PM

## 2020-10-11 NOTE — Progress Notes (Signed)
PROGRESS NOTE    [name] [name]  YSA:[id number] DOB: [birth year] DOA: 10/09/2020 PCP: [name] Riches, MD    Brief Narrative:  [name] [name] was admitted to the hospital with the working diagnosis of acute metabolic encephalopathy in the setting of chronic sacral osteomyelitis and severe calorie protein malnutrition.   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  Reported worsening symptoms for about 3 weeks.  Recent hospitalization May 2022. She has been receiving antibiotic therapy as an outpatient, vancomycin and Zosyn through PICC line. Patient has been receiving metoclopramide for nausea and baclofen for muscle spasms. On his initial physical examination blood pressure 121/83, heart rate 96, respiratory rate 22, oxygen saturation 99%, patient was alert and oriented to self, dry mucous membranes, lungs clear to auscultation bilaterally, heart S1-S2, present, rhythmic, abdomen soft, no lower extremity edema.  Multiple pressure ulcers.  Dry sacral wound.  Venous pH 7.32, PCO2 38.4, PO2 20.8, bicarb 19.6, sodium 147, potassium 3.6, chloride 117, bicarb 19, glucose 90, BUN 38, creatinine 1.71.  White cell count 10.3, hemoglobin 10.5, hematocrit 32.9, platelets 147. SARS COVID-19 negative.  Urinalysis specific gravity 1.016, negative nitrates  Head CT negative for acute changes.  Chest radiograph no infiltrates.  Patient was placed on supportive medical therapy including intravenous fluids.  Neurology recommended high doses of IV thiamine..  Assessment & Plan:   Active Problems:   Human immunodeficiency virus (HIV) disease ([place])   Unspecified severe  protein-calorie malnutrition ([place])   AKI (acute kidney injury) ([place])   Acute metabolic encephalopathy   Sacral osteomyelitis ([facility])   Chronic diastolic CHF (congestive heart failure) (H[facility])   Anemia   Rigidity   Hypernatremia   Functional quadriplegia ([facility])   Pressure injury of skin   Acute encephalopathy   Acute metabolic/ toxic encephalopathy. Patient is calm and orientated to person and place. Slow to respond but follows commands.   Plan to continue neuro checks, PT, OT and speech therapy. High dose thiamine and multivitamins.  Discontinue telemetry and transfer to med surg ward.   2. AKI, hypernatremia renal function with serum cr at 1,87 with Na 142 and K at 3,4 with bicarbonate at 21 and Cl 117.  Urinary Na is 126.  Patient is tolerating po well  Plan to dc IV fluids. Antibiotic therapy changed from vancomycin to dapotomycin  3. Chronic sacral osteomyelitis, functional quadriplegia, dysphagia, chronic anemia, severe calorie protein malnutrition. Bilateral heal stage 2 pressure ulcer, present on admission Antibiotic therapy with zosyn and daptomycin, today changed to Augmentin and doxycycline.  Plan to complete 6 weeks, antibiotic therapy started on 09.04.22,  Continue nutritional supplements.  Hgb has been stable.  Continue with local wound care.  On dysphagia 3 diet.   4. HIV, Thrush Continue with antiretroviral therapy.  On nystatin.   5. Chronic diastolic heart failure.  Stable with no signs of acute decompensation   Patient continue to be at high risk for worsening renal failure   Status is: Inpatient  Remains inpatient appropriate because:Inpatient level of care appropriate due to severity of illness  Dispo: The patient is from: SNF              Anticipated d/c is to: SNF  Patient currently is not medically stable to d/c.   Difficult to place patient No   DVT prophylaxis: Enoxaparin   Code Status:    DNR   Family Communication:   No family a  the bedside      Nutrition Status:           Skin Documentation: Pressure Injury 05/13/20 Sacrum Mid Unstageable - Full thickness tissue loss in which the base of the injury is covered by slough (yellow, tan, gray, green or brown) and/or eschar (tan, brown or black) in the wound bed. (Active)  05/13/20   Location: Sacrum  Location Orientation: Mid  Staging: Unstageable - Full thickness tissue loss in which the base of the injury is covered by slough (yellow, tan, gray, green or brown) and/or eschar (tan, brown or black) in the wound bed.  Wound Description (Comments):   Present on Admission: Yes     Pressure Injury 10/09/20 Heel Left Stage 2 -  Partial thickness loss of dermis presenting as a shallow open injury with a red, pink wound bed without slough. (Active)  10/09/20 2300  Location: Heel  Location Orientation: Left  Staging: Stage 2 -  Partial thickness loss of dermis presenting as a shallow open injury with a red, pink wound bed without slough.  Wound Description (Comments):   Present on Admission: Yes     Pressure Injury 10/09/20 Heel Right Stage 2 -  Partial thickness loss of dermis presenting as a shallow open injury with a red, pink wound bed without slough. (Active)  10/09/20 2300  Location: Heel  Location Orientation: Right  Staging: Stage 2 -  Partial thickness loss of dermis presenting as a shallow open injury with a red, pink wound bed without slough.  Wound Description (Comments):   Present on Admission: Yes     Consultants:  ID Neurology    Antimicrobials:  Zosyn and daptomycin     Subjective: Patient slow to respond, but not nausea or vomiting, no chest pain or dyspnea.   Objective: Vitals:   10/11/20 0350 10/11/20 0401 10/11/20 0600 10/11/20 0800  BP:  (!) 149/95 (!) 141/89 (!) 123/106  Pulse:  72 65 (!) 58  Resp:  19 19 12   Temp: 98 F (36.7 C)   98.5 F (36.9 C)  TempSrc: Oral   Oral  SpO2:  100% 99% 100%  Weight:      Height:         Intake/Output Summary (Last 24 hours) at 10/11/2020 0940 Last data filed at 10/11/2020 0716 Gross per 24 hour  Intake 1793.09 ml  Output 1675 ml  Net 118.09 ml   Filed Weights   10/09/20 1448 10/09/20 2300  Weight: 72 kg 56.6 kg    Examination:   General: Not in pain or dyspnea. Deconditioned  Neurology: Awake and alert, non focal  E ENT: no pallor, no icterus, oral mucosa moist Cardiovascular: No JVD. S1-S2 present, rhythmic, no gallops, rubs, positive systolic murmur 3/6 at the apex. . No lower extremity edema. Pulmonary: positive breath sounds bilaterally, with no wheezing, rhonchi or rales. Gastrointestinal. Abdomen soft and non tender Skin. No rashes Musculoskeletal: no joint deformities     Data Reviewed: I have personally reviewed following labs and imaging studies  CBC: Recent Labs  Lab 10/09/20 1452 10/10/20 0308 10/11/20 0434  WBC 10.3 7.3 7.8  NEUTROABS 7.5 4.9 5.2  HGB 10.5* 8.9* 9.1*  HCT 32.9* 27.6* 27.7*  MCV 104.4* 104.2* 103.7*  PLT 147* 132* 125*  Basic Metabolic Panel: Recent Labs  Lab 10/09/20 1452 10/09/20 2331 10/09/20 2332 10/10/20 0308 10/10/20 1348 10/11/20 0434  NA 148* 147*  --  147* 142 142  K 3.9 3.6  --  3.6 3.6 3.4*  CL 116* 117*  --  118* 117* 117*  CO2 20* 19*  --  21* 22 21*  GLUCOSE 125* 90  --  100* 108* 87  BUN 43* 38*  --  42* 36* 36*  CREATININE 1.76* 1.71*  --  1.70* 1.72* 1.87*  CALCIUM 9.8 9.8  --  9.4 9.2 9.2  MG  --   --  1.8 2.5*  --  1.9  PHOS  --   --  3.6 3.3  --   --    GFR: Estimated Creatinine Clearance: 34.5 mL/min (A) (by C-G formula based on SCr of 1.87 mg/dL (H)). Liver Function Tests: Recent Labs  Lab 10/09/20 1452 10/10/20 0308  AST 30 26  ALT 35 33  ALKPHOS 37* 32*  BILITOT 0.9 0.5  PROT 6.7 5.8*  ALBUMIN 3.3* 2.8*   No results for input(s): LIPASE, AMYLASE in the last 168 hours. Recent Labs  Lab 10/09/20 2332  AMMONIA 21   Coagulation Profile: Recent Labs  Lab  10/09/20 1452  INR 1.1   Cardiac Enzymes: Recent Labs  Lab 10/09/20 2332 10/10/20 0308 10/11/20 0434  CKTOTAL 74 56 42*   BNP (last 3 results) No results for input(s): PROBNP in the last 8760 hours. HbA1C: No results for input(s): HGBA1C in the last 72 hours. CBG: No results for input(s): GLUCAP in the last 168 hours. Lipid Profile: No results for input(s): CHOL, HDL, LDLCALC, TRIG, CHOLHDL, LDLDIRECT in the last 72 hours. Thyroid Function Tests: Recent Labs    10/10/20 0308  TSH 3.908   Anemia Panel: Recent Labs    10/09/20 2332  VITAMINB12 111*  FOLATE 22.3  FERRITIN 234  TIBC 280  IRON 73  RETICCTPCT 4.2*      Radiology Studies: I have reviewed all of the imaging during this hospital visit personally     Scheduled Meds:  baclofen  5 mg Oral BID   chlorhexidine gluconate (MEDLINE KIT)  15 mL Mouth Rinse BID   Chlorhexidine Gluconate Cloth  6 each Topical Q0600   cyanocobalamin  1,000 mcg Intramuscular Daily   Followed by   Derrill Memo ON 10/17/2020] vitamin B-12  1,000 mcg Oral Daily   darunavir-cobicistat  1 tablet Oral Q breakfast   dolutegravir  50 mg Oral Daily   feeding supplement  237 mL Oral BID BM   mouth rinse  15 mL Mouth Rinse q12n4p   megestrol  800 mg Oral Daily   mupirocin ointment  1 application Nasal BID   nystatin  5 mL Mouth/Throat QID   rilpivirine  25 mg Oral Q breakfast   sodium chloride flush  10-40 mL Intracatheter Q12H   thiamine  100 mg Oral Daily   Continuous Infusions:  sodium chloride Stopped (10/11/20 0522)   DAPTOmycin (CUBICIN)  IV Stopped (10/10/20 2115)   lactated ringers 75 mL/hr at 10/11/20 0716   piperacillin-tazobactam (ZOSYN)  IV 12.5 mL/hr at 10/11/20 0716   thiamine injection Stopped (10/10/20 2155)     LOS: 1 day        Janaysia Mcleroy Gerome Apley, MD

## 2020-10-11 NOTE — Procedures (Addendum)
Patient Name: JORAH HUA  MRN: [id number]  Epilepsy Attending: Lora Havens  Referring Physician/Provider: Clance Boll, NP Date: 10/11/2020 Duration: 22.20 mins  Patient history: 60 yo male presenting from SNF for increased confusion x 3 days.  EEG to evaluate for seizures.  Level of alertness: Awake, drowsy  AEDs during EEG study: None  Technical aspects: This EEG study was done with scalp electrodes positioned according to the 10-20 International system of electrode placement. Electrical activity was acquired at a sampling rate of 500Hz  and reviewed with a high frequency filter of 70Hz  and a low frequency filter of 1Hz . EEG data were recorded continuously and digitally stored.   Description: The posterior dominant rhythm consists of 8 Hz activity of moderate voltage (25-35 uV) seen predominantly in posterior head regions, symmetric and reactive to eye opening and eye closing. Drowsiness was characterized by attenuation of posterior dominant rhythm. EEG showed intermittent generalized 3 to 6 Hz theta-delta slowing.  Hyperventilation and photic stimulation were not performed.     ABNORMALITY - Intermittent slow, generalized  IMPRESSION: This study is suggestive of mild diffuse encephalopathy, nonspecific etiology. No seizures or epileptiform discharges were seen throughout the recording.  Ryne Mctigue Barbra Sarks

## 2020-10-11 NOTE — Progress Notes (Signed)
Initial Nutrition Assessment  DOCUMENTATION CODES:  Severe malnutrition in context of chronic illness, Underweight  INTERVENTION:  Increase Ensure from BID to TID.  Add Magic cup TID with meals, each supplement provides 290 kcal and 9 grams of protein.  Add MVI with minerals daily.  Encourage PO intake.  NUTRITION DIAGNOSIS:  Severe Malnutrition related to chronic illness (HIV) as evidenced by severe fat depletion, severe muscle depletion, percent weight loss.  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  Per Epic, pt ate 50% of lunch yesterday and 75% this morning.  Pt extremely lethargic during RD visit. Did not speak or respond much to touch.  Per Epic, pt has lost ~34 lbs (21.4%) in the past 4.5 months, which is significant and severe for the time frame.  Recommend increasing Ensure from BID to TID, add Magic Cup TID, and MVI with minerals daily.  Supplements: Ensure BID  Medications: reviewed; Augmentin PO BID, Vitamin B12, Megace, thiamine, LR @ 75 ml/hr  Labs: reviewed; K 3.4 (L)  NUTRITION - FOCUSED PHYSICAL EXAM: Flowsheet Row Most Recent Value  Orbital Region Moderate depletion  Upper Arm Region Moderate depletion  Thoracic and Lumbar Region Unable to assess  Buccal Region Severe depletion  Temple Region Severe depletion  Clavicle Bone Region Moderate depletion  Clavicle and Acromion Bone Region Moderate depletion  Scapular Bone Region Severe depletion   Dorsal Hand Moderate depletion  Patellar Region Moderate depletion  Anterior Thigh Region Moderate depletion  Posterior Calf Region Severe depletion  Edema (RD Assessment) None  Hair Reviewed  Eyes Unable to assess  Mouth Unable to assess  Skin Reviewed  Nails Reviewed   Diet Order:   Diet Order             DIET DYS 3 Room service appropriate? Yes; Fluid consistency: Thin  Diet effective now                  EDUCATION NEEDS:  Not appropriate for education at this time  Skin:  Skin Assessment: Skin Integrity Issues: Skin Integrity Issues:: Stage II Stage II: R heel  Last BM:  10/10/20 - Type 6, large  Height:  Ht Readings from Last 1 Encounters:  10/09/20 6\' 2"  (1.88 m)   Weight:  Wt Readings from Last 1 Encounters:  10/09/20 56.6 kg   BMI:  Body mass index is 16.02 kg/m.  Estimated Nutritional Needs:  Kcal:  2200-2400 Protein:  80-95 grams Fluid:  >2.2 L  Derrel Nip, RD, LDN (she/her/hers) Registered Dietitian I After-Hours/Weekend Pager # in [place]

## 2020-10-11 NOTE — Plan of Care (Signed)
EEG reviewed, "This study is suggestive of mild diffuse encephalopathy, nonspecific etiology. No seizures or epileptiform discharges were seen throughout the recording," noted to have generalized intermittent slowing.   Plan as outlined previously by Dr. Leonel Ramsay, expect slow recovery as experienced previously with caveat that he may not recover fully to prior baseline due to extent of brain atrophy and prior strokes and poor physiological reserve. Neurology will be available as needed going forward, please reach out if new questions arise.   Lesleigh Noe MD-PhD [facility] [phone]  Available 7 AM to 7 PM, outside these hours please contact Neurologist on call listed on AMION

## 2020-10-12 DIAGNOSIS — D649 Anemia, unspecified: Secondary | ICD-10-CM | POA: Diagnosis not present

## 2020-10-12 DIAGNOSIS — N179 Acute kidney failure, unspecified: Secondary | ICD-10-CM | POA: Diagnosis not present

## 2020-10-12 DIAGNOSIS — R29898 Other symptoms and signs involving the musculoskeletal system: Secondary | ICD-10-CM

## 2020-10-12 DIAGNOSIS — M4628 Osteomyelitis of vertebra, sacral and sacrococcygeal region: Secondary | ICD-10-CM | POA: Diagnosis not present

## 2020-10-12 LAB — CBC WITH DIFFERENTIAL/PLATELET
Abs Immature Granulocytes: 0.34 10*3/uL — ABNORMAL HIGH (ref 0.00–0.07)
Basophils Absolute: 0 10*3/uL (ref 0.0–0.1)
Basophils Relative: 0 %
Eosinophils Absolute: 0.2 10*3/uL (ref 0.0–0.5)
Eosinophils Relative: 2 %
HCT: 27.7 % — ABNORMAL LOW (ref 39.0–52.0)
Hemoglobin: 8.8 g/dL — ABNORMAL LOW (ref 13.0–17.0)
Immature Granulocytes: 4 %
Lymphocytes Relative: 21 %
Lymphs Abs: 1.7 10*3/uL (ref 0.7–4.0)
MCH: 32.8 pg (ref 26.0–34.0)
MCHC: 31.8 g/dL (ref 30.0–36.0)
MCV: 103.4 fL — ABNORMAL HIGH (ref 80.0–100.0)
Monocytes Absolute: 0.7 10*3/uL (ref 0.1–1.0)
Monocytes Relative: 9 %
Neutro Abs: 4.9 10*3/uL (ref 1.7–7.7)
Neutrophils Relative %: 64 %
Platelets: 123 10*3/uL — ABNORMAL LOW (ref 150–400)
RBC: 2.68 MIL/uL — ABNORMAL LOW (ref 4.22–5.81)
RDW: 15.3 % (ref 11.5–15.5)
WBC: 7.8 10*3/uL (ref 4.0–10.5)
nRBC: 0.3 % — ABNORMAL HIGH (ref 0.0–0.2)

## 2020-10-12 LAB — BASIC METABOLIC PANEL
Anion gap: 5 (ref 5–15)
BUN: 39 mg/dL — ABNORMAL HIGH (ref 6–20)
CO2: 21 mmol/L — ABNORMAL LOW (ref 22–32)
Calcium: 9.5 mg/dL (ref 8.9–10.3)
Chloride: 118 mmol/L — ABNORMAL HIGH (ref 98–111)
Creatinine, Ser: 1.77 mg/dL — ABNORMAL HIGH (ref 0.61–1.24)
GFR, Estimated: 44 mL/min — ABNORMAL LOW (ref >60)
Glucose, Bld: 101 mg/dL — ABNORMAL HIGH (ref 70–99)
Potassium: 3.4 mmol/L — ABNORMAL LOW (ref 3.5–5.1)
Sodium: 144 mmol/L (ref 135–145)

## 2020-10-12 LAB — MAGNESIUM: Magnesium: 1.7 mg/dL (ref 1.7–2.4)

## 2020-10-12 MED ORDER — POTASSIUM CHLORIDE CRYS ER 20 MEQ PO TBCR
40.0000 meq | EXTENDED_RELEASE_TABLET | Freq: Once | ORAL | Status: AC
  Administered 2020-10-12: 40 meq via ORAL
  Filled 2020-10-12: qty 2

## 2020-10-12 MED ORDER — CHLORHEXIDINE GLUCONATE 0.12 % MT SOLN
OROMUCOSAL | Status: AC
  Filled 2020-10-12: qty 15

## 2020-10-12 NOTE — Progress Notes (Signed)
PROGRESS NOTE    [name] [name]  XJO:[id number] DOB: July 26, 1960 DOA: 10/09/2020 PCP: [name], MD    Brief Narrative:  [name] [name] was admitted to the hospital with the working diagnosis of acute metabolic encephalopathy in the setting of chronic sacral osteomyelitis and severe calorie protein malnutrition.    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  Reported worsening symptoms for about 3 weeks.  Recent hospitalization May 2022. She has been receiving antibiotic therapy as an outpatient, vancomycin and Zosyn through PICC line. Patient has been receiving metoclopramide for nausea and baclofen for muscle spasms. On his initial physical examination blood pressure 121/83, heart rate 96, respiratory rate 22, oxygen saturation 99%, patient was alert and oriented to self, dry mucous membranes, lungs clear to auscultation bilaterally, heart S1-S2, present, rhythmic, abdomen soft, no lower extremity edema.  Multiple pressure ulcers.  Dry sacral wound.   Venous pH 7.32, PCO2 38.4, PO2 20.8, bicarb 19.6, sodium 147, potassium 3.6, chloride 117, bicarb 19, glucose 90, BUN 38, creatinine 1.71.  White cell count 10.3, hemoglobin 10.5, hematocrit 32.9, platelets 147. SARS COVID-19 negative.   Urinalysis specific gravity 1.016, negative nitrates   Head CT negative for acute changes.  Chest radiograph no infiltrates.   Patient was placed on supportive medical therapy including intravenous fluids.  Neurology recommended high doses of IV thiamine..   Assessment & Plan:   Active Problems:   Human immunodeficiency virus (HIV) disease ([place])   Unspecified severe  protein-calorie malnutrition ([place])   AKI (acute kidney injury) ([place])   Acute metabolic encephalopathy   Sacral osteomyelitis ([facility])   Chronic diastolic CHF (congestive heart failure) ([facility])   Anemia   Rigidity   Hypernatremia   Functional quadriplegia ([facility])   Pressure injury of skin   Acute encephalopathy   Acute metabolic/ toxic encephalopathy.  Patient's mentation continue to improve. EEG with no seizures.   Continue thiamine and multivitamins.  Plan to transfer patient to SNF, per PT recommendations.    2. AKI, on CKD stage 3a, hypernatremia and hypokalemia.   Serum cr is 1,77, with K at 3,4 and serum bicarbonate at 21.  Patient is tolerating po well, no nausea or vomiting.  Plan to continue K correction with 40 meq Kcl and follow up renal function in am, avoid hypotension and nephrotoxic medications.     3. Chronic sacral osteomyelitis, functional quadriplegia, dysphagia, chronic anemia, severe calorie protein malnutrition. Bilateral heal stage 2 pressure ulcer, present on admission Continue antibiotic therapy with Augmentin and doxycycline until 10/17 to complete 6 weeks. Discontinue PICC line Local wound care.   Continue with nutritional supplements, patient on dysphagia 3 diet.   Wound with poor prognosis and high risk of recurrence considering patient's poor mobility and malnutrition. Continue bid baclofen.     4. HIV, Thrush Antiretroviral therapy.  Continue with nystatin.    5. Chronic diastolic heart failure.  Stable no acute decompensation     Status is: Inpatient  Remains inpatient appropriate because:Inpatient level of care appropriate due to severity of illness  Dispo: The patient is from: SNF              Anticipated d/c is to: SNF  Patient currently is not medically stable to d/c.   Difficult to place patient No  DVT prophylaxis: Enoxaparin   Code Status:    DNR  Family Communication:  I spoke over the phone with the patient's sister about  patient's  condition, plan of care, prognosis and all questions were addressed.    Nutrition Status: Nutrition Problem: Severe Malnutrition Etiology: chronic illness (HIV) Signs/Symptoms: severe fat depletion, severe muscle depletion, percent weight loss Percent weight loss: 21.4 % Interventions: Ensure Enlive (each supplement provides 350kcal and 20 grams of protein), Magic cup, MVI     Skin Documentation: Pressure Injury 05/13/20 Sacrum Mid Stage 3 -  Full thickness tissue loss. Subcutaneous fat may be visible but bone, tendon or muscle are NOT exposed. Was [place] PI, now healing Stage 3 because was previously Stage 3 (Active)  05/13/20   Location: Sacrum  Location Orientation: Mid  Staging: Stage 3 -  Full thickness tissue loss. Subcutaneous fat may be visible but bone, tendon or muscle are NOT exposed.  Wound Description (Comments): Was [place] PI, now healing Stage 3 because was previously Stage 3  Present on Admission: Yes     Pressure Injury 10/09/20 Heel Right Stage 2 -  Partial thickness loss of dermis presenting as a shallow open injury with a red, pink wound bed without slough. (Active)  10/09/20 2300  Location: Heel  Location Orientation: Right  Staging: Stage 2 -  Partial thickness loss of dermis presenting as a shallow open injury with a red, pink wound bed without slough.  Wound Description (Comments):   Present on Admission: Yes     Consultants:  ID    Antimicrobials:  Augmentin and doxycyline     Subjective: Patient is feeling better, mentation continue to improve, no nausea or vomiting. Continue to be very weak and deconditioned.   Objective: Vitals:   10/11/20 1900 10/11/20 2300 10/12/20 0300 10/12/20 0800  BP: (!) 126/93 (!) 153/90 (!) 158/98 138/78  Pulse: 86 79    Resp: 16 17 16 17   Temp: 99.3 F (37.4 C) 98 F (36.7 C) 98.4 F (36.9 C) 98.7 F (37.1 C)  TempSrc: Axillary Oral Oral Oral  SpO2: 100% 100% 100% 100%  Weight:      Height:         Intake/Output Summary (Last 24 hours) at 10/12/2020 1340 Last data filed at 10/11/2020 1800 Gross per 24 hour  Intake 103.61 ml  Output 950 ml  Net -846.39 ml   Filed Weights   10/09/20 1448 10/09/20 2300  Weight: 72 kg 56.6 kg    Examination:   General: Not in pain or dyspnea. Deconditioned  Neurology: Awake and alert, improved responsiveness, responds to simple questions and follows commands. Persistent disorientation but improving.  E ENT: no pallor, no icterus, oral mucosa moist Cardiovascular: No JVD. S1-S2 present, rhythmic, no gallops, rubs, or murmurs. No lower extremity edema. Pulmonary: positive breath sounds bilaterally, adequate air movement, no wheezing, rhonchi or rales. Gastrointestinal. Abdomen soft and non tender Skin. No rashes Musculoskeletal: no joint deformities     Data Reviewed: I have personally reviewed following labs and imaging studies  CBC: Recent Labs  Lab 10/09/20 1452 10/10/20 0308 10/11/20 0434 10/12/20 0445  WBC 10.3 7.3 7.8 7.8  NEUTROABS 7.5 4.9 5.2 4.9  HGB 10.5* 8.9* 9.1* 8.8*  HCT 32.9* 27.6* 27.7* 27.7*  MCV 104.4* 104.2* 103.7* 103.4*  PLT 147* 132* 125* 536*   Basic Metabolic Panel: Recent Labs  Lab 10/09/20 2331 10/09/20 2332  10/10/20 0308 10/10/20 1348 10/11/20 0434 10/12/20 0445  NA 147*  --  147* 142 142 144  K 3.6  --  3.6 3.6 3.4* 3.4*  CL 117*  --  118* 117* 117* 118*  CO2 19*  --  21* 22 21* 21*  GLUCOSE 90  --  100* 108* 87 101*  BUN 38*  --  42* 36* 36* 39*  CREATININE 1.71*  --  1.70* 1.72* 1.87* 1.77*  CALCIUM 9.8  --  9.4 9.2 9.2 9.5  MG  --  1.8 2.5*  --  1.9 1.7  PHOS  --  3.6 3.3  --   --   --    GFR: Estimated Creatinine Clearance: 36.4 mL/min (A) (by C-G formula based on SCr of 1.77 mg/dL (H)). Liver Function Tests: Recent Labs  Lab 10/09/20 1452 10/10/20 0308  AST 30 26  ALT 35 33  ALKPHOS 37* 32*  BILITOT 0.9 0.5  PROT 6.7 5.8*  ALBUMIN 3.3* 2.8*   No results for input(s):  LIPASE, AMYLASE in the last 168 hours. Recent Labs  Lab 10/09/20 2332  AMMONIA 21   Coagulation Profile: Recent Labs  Lab 10/09/20 1452  INR 1.1   Cardiac Enzymes: Recent Labs  Lab 10/09/20 2332 10/10/20 0308 10/11/20 0434  CKTOTAL 74 56 42*   BNP (last 3 results) No results for input(s): PROBNP in the last 8760 hours. HbA1C: No results for input(s): HGBA1C in the last 72 hours. CBG: No results for input(s): GLUCAP in the last 168 hours. Lipid Profile: No results for input(s): CHOL, HDL, LDLCALC, TRIG, CHOLHDL, LDLDIRECT in the last 72 hours. Thyroid Function Tests: Recent Labs    10/10/20 0308  TSH 3.908   Anemia Panel: Recent Labs    10/09/20 2332  VITAMINB12 111*  FOLATE 22.3  FERRITIN 234  TIBC 280  IRON 73  RETICCTPCT 4.2*      Radiology Studies: I have reviewed all of the imaging during this hospital visit personally     Scheduled Meds:  amoxicillin-clavulanate  1 tablet Oral Q12H   baclofen  5 mg Oral BID   chlorhexidine gluconate (MEDLINE KIT)  15 mL Mouth Rinse BID   Chlorhexidine Gluconate Cloth  6 each Topical Q0600   cyanocobalamin  1,000 mcg Intramuscular Daily   Followed by   Derrill Memo ON 10/17/2020] vitamin B-12  1,000 mcg Oral Daily   darunavir-cobicistat  1 tablet Oral Q breakfast   dolutegravir  50 mg Oral Daily   doxycycline  100 mg Oral Q12H   feeding supplement  237 mL Oral TID BM   mouth rinse  15 mL Mouth Rinse q12n4p   megestrol  800 mg Oral Daily   multivitamin with minerals  1 tablet Oral Daily   mupirocin ointment  1 application Nasal BID   nystatin  5 mL Mouth/Throat QID   rilpivirine  25 mg Oral Q breakfast   sodium chloride flush  10-40 mL Intracatheter Q12H   thiamine  100 mg Oral Daily   Continuous Infusions:  sodium chloride 10 mL/hr at 10/11/20 1601   thiamine injection 500 mg (10/12/20 1230)     LOS: 2 days        Arvle Grabe Gerome Apley, MD

## 2020-10-13 DIAGNOSIS — L89159 Pressure ulcer of sacral region, unspecified stage: Secondary | ICD-10-CM

## 2020-10-13 LAB — BASIC METABOLIC PANEL
Anion gap: 10 (ref 5–15)
BUN: 34 mg/dL — ABNORMAL HIGH (ref 6–20)
CO2: 18 mmol/L — ABNORMAL LOW (ref 22–32)
Calcium: 9.4 mg/dL (ref 8.9–10.3)
Chloride: 117 mmol/L — ABNORMAL HIGH (ref 98–111)
Creatinine, Ser: 1.25 mg/dL — ABNORMAL HIGH (ref 0.61–1.24)
GFR, Estimated: >60 mL/min (ref >60)
Glucose, Bld: 101 mg/dL — ABNORMAL HIGH (ref 70–99)
Potassium: 4.1 mmol/L (ref 3.5–5.1)
Sodium: 145 mmol/L (ref 135–145)

## 2020-10-13 MED ORDER — ENOXAPARIN SODIUM 40 MG/0.4ML IJ SOSY
40.0000 mg | PREFILLED_SYRINGE | INTRAMUSCULAR | Status: DC
  Administered 2020-10-13 – 2020-10-14 (×2): 40 mg via SUBCUTANEOUS
  Filled 2020-10-13 (×2): qty 0.4

## 2020-10-13 NOTE — Plan of Care (Signed)
  Problem: Activity: Goal: Risk for activity intolerance will decrease Outcome: Progressing   Problem: Skin Integrity: Goal: Risk for impaired skin integrity will decrease Outcome: Progressing   Problem: Safety: Goal: Ability to remain free from injury will improve Outcome: Progressing

## 2020-10-13 NOTE — Progress Notes (Signed)
Patient refused dressing change now. Requesting dressing change to be done later this evening. Dressing is dry, clean intact.

## 2020-10-13 NOTE — TOC Benefit Eligibility Note (Signed)
Transition of Care [facility]) Benefit Eligibility Note    Patient Details  Name: [name] [name] MRN: [id number] Date of Birth: 1960-08-04   Medication/Dose: Brilinta 90 mg bid x 30 days  Covered?: Yes  Tier: Other  Prescription Coverage Preferred Pharmacy: local  Spoke with Person/Company/Phone Number:: [facility] [phone]  Co-Pay: 0  Prior Approval: No  Deductible: Met       [name] Phone Number: 10/13/2020, 11:26 AM

## 2020-10-13 NOTE — Progress Notes (Signed)
PROGRESS NOTE    [name] [name]  AUQ:[id number] DOB: 05/28/1960 DOA: 10/09/2020 PCP: [name], MD    Brief Narrative:  [name] [name] was admitted to the hospital with the working diagnosis of acute metabolic encephalopathy in the setting of chronic sacral osteomyelitis and severe calorie protein malnutrition.    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  Reported worsening symptoms for about 3 weeks.  Recent hospitalization May 2022. She has been receiving antibiotic therapy as an outpatient, vancomycin and Zosyn through PICC line. Patient has been receiving metoclopramide for nausea and baclofen for muscle spasms. On his initial physical examination blood pressure 121/83, heart rate 96, respiratory rate 22, oxygen saturation 99%, patient was alert and oriented to self, dry mucous membranes, lungs clear to auscultation bilaterally, heart S1-S2, present, rhythmic, abdomen soft, no lower extremity edema.  Multiple pressure ulcers.  Dry sacral wound.   Venous pH 7.32, PCO2 38.4, PO2 20.8, bicarb 19.6, sodium 147, potassium 3.6, chloride 117, bicarb 19, glucose 90, BUN 38, creatinine 1.71.  White cell count 10.3, hemoglobin 10.5, hematocrit 32.9, platelets 147. SARS COVID-19 negative.   Urinalysis specific gravity 1.016, negative nitrates   Head CT negative for acute changes.  Chest radiograph no infiltrates.   Patient was placed on supportive medical therapy including intravenous fluids.  Neurology recommended high doses of IV thiamine..   Assessment & Plan:   Active Problems:   Human immunodeficiency virus (HIV) disease ([place])   Unspecified severe  protein-calorie malnutrition ([place])   AKI (acute kidney injury) ([place])   Acute metabolic encephalopathy   Sacral osteomyelitis ([facility])   Chronic diastolic CHF (congestive heart failure) ([facility])   Anemia   Rigidity   Hypernatremia   Functional quadriplegia ([facility])   Pressure injury of skin   Acute encephalopathy   Acute metabolic/ toxic encephalopathy.  EEG with no seizures.   Continue to improve mentation, no further hallucinations. Mid confusion but not agitation.  On thiamine and multivitamins.   Plan to transfer to ALF tomorrow, will get COVID 19 screen today.    2. AKI, on CKD stage 3a, hypernatremia and hypokalemia.  non anion gap metabolic acidosis.  Renal function continue to improve, serum cr today is 1,25 with K at 4,1 and serum bicarbonate at 18,  Patient tolerating po well, now off IV fluids.    3. Chronic sacral osteomyelitis, functional quadriplegia, dysphagia, chronic anemia, severe calorie protein malnutrition. Bilateral heal stage 2 pressure ulcer, present on admission  Augmentin and doxycycline plan to complete on 10/17 (6 weeks) On dysphagia 3 diet.    Wound with poor prognosis and high risk of recurrence considering patient's poor mobility and malnutrition. Bid baclofen.      4. HIV, Thrush Continue with antiretroviral therapy.  On nystatin.    5. Chronic diastolic heart failure.  No acute decompensation, continue blood pressure monitoring. Off IV fluids,   Status is: Inpatient  Remains inpatient appropriate because:Inpatient level of care appropriate due to severity of illness  Dispo: The patient is from: ALF              Anticipated d/c is to: ALF              Patient currently is medically stable to d/c.  Difficult to place patient No  DVT prophylaxis: Enoxaparin   Code Status:    DNR   Family Communication:  I spoke over the phone with the patient's sister about patient's  condition, plan of care, prognosis and all questions were addressed.     Nutrition Status: Nutrition Problem: Severe Malnutrition Etiology: chronic illness (HIV) Signs/Symptoms: severe fat depletion, severe muscle depletion, percent weight loss Percent weight loss: 21.4 % Interventions: Ensure Enlive (each supplement provides 350kcal and 20 grams of protein), Magic cup, MVI    Consultants:  ID   Antimicrobials:  Augmentin and Doxycycline     Subjective:  Patient feeling better, no nausea or vomiting, no further hallucinations.   Objective: Vitals:   10/12/20 2007 10/12/20 2326 10/13/20 0545 10/13/20 1300  BP: 126/78 (!) 142/94 129/89 124/70  Pulse: 74 76 64 85  Resp: _0 Temp: 98.2 F (36.8 C) 99.1 F (37.3 C) 98.1 F (36.7 C) 98.3 F (36.8 C)  TempSrc: Oral Oral Oral Oral  SpO2: 100% 100% 100%   Weight:      Height:        Intake/Output Summary (Last 24 hours) at 10/13/2020 1332 Last data filed at 10/13/2020 0545 Gross per 24 hour  Intake 180 ml  Output 1100 ml  Net -920 ml   Filed Weights   10/09/20 1448 10/09/20 2300  Weight: 72 kg 56.6 kg    Examination:   General: Not in pain or dyspnea, deconditioned  Neurology: Awake and alert, non focal. More responsive. Functional quadriplegia.  E ENT: mild pallor, no icterus, oral mucosa moist Cardiovascular: No JVD. S1-S2 present, rhythmic, no gallops, rubs, or murmurs. No lower extremity edema. Pulmonary: positive breath sounds bilaterally, adequate air movement, no wheezing, rhonchi or rales. Gastrointestinal. Abdomen soft and non tender Skin. Pressure ulcer with dressing in place.  Musculoskeletal: no joint deformities     Data Reviewed: I have personally reviewed following labs and imaging studies  CBC: Recent Labs  Lab 10/09/20 1452 10/10/20 0308 10/11/20 0434 10/12/20 0445  WBC 10.3 7.3 7.8 7.8  NEUTROABS 7.5 4.9 5.2 4.9  HGB 10.5* 8.9* 9.1* 8.8*  HCT 32.9* 27.6* 27.7* 27.7*  MCV 104.4* 104.2* 103.7* 103.4*  PLT 147* 132* 125* 572*   Basic Metabolic  Panel: Recent Labs  Lab 10/09/20 2332 10/10/20 0308 10/10/20 1348 10/11/20 0434 10/12/20 0445 10/13/20 0608  NA  --  147* 142 142 144 145  K  --  3.6 3.6 3.4* 3.4* 4.1  CL  --  118* 117* 117* 118* 117*  CO2  --  21* 22 21* 21* 18*  GLUCOSE  --  100* 108* 87 101* 101*  BUN  --  42* 36* 36* 39* 34*  CREATININE  --  1.70* 1.72* 1.87* 1.77* 1.25*  CALCIUM  --  9.4 9.2 9.2 9.5 9.4  MG 1.8 2.5*  --  1.9 1.7  --   PHOS 3.6 3.3  --   --   --   --    GFR: Estimated Creatinine Clearance: 51.6 mL/min (A) (by C-G formula based on SCr of 1.25 mg/dL (H)). Liver Function Tests: Recent Labs  Lab 10/09/20 1452 10/10/20 0308  AST 30 26  ALT 35 33  ALKPHOS 37* 32*  BILITOT 0.9 0.5  PROT 6.7 5.8*  ALBUMIN 3.3* 2.8*   No results for input(s): LIPASE, AMYLASE in the last 168 hours. Recent Labs  Lab 10/09/20 2332  AMMONIA 21   Coagulation Profile: Recent Labs  Lab  10/09/20 1452  INR 1.1   Cardiac Enzymes: Recent Labs  Lab 10/09/20 2332 10/10/20 0308 10/11/20 0434  CKTOTAL 74 56 42*   BNP (last 3 results) No results for input(s): PROBNP in the last 8760 hours. HbA1C: No results for input(s): HGBA1C in the last 72 hours. CBG: No results for input(s): GLUCAP in the last 168 hours. Lipid Profile: No results for input(s): CHOL, HDL, LDLCALC, TRIG, CHOLHDL, LDLDIRECT in the last 72 hours. Thyroid Function Tests: No results for input(s): TSH, T4TOTAL, FREET4, T3FREE, THYROIDAB in the last 72 hours. Anemia Panel: No results for input(s): VITAMINB12, FOLATE, FERRITIN, TIBC, IRON, RETICCTPCT in the last 72 hours.    Radiology Studies: I have reviewed all of the imaging during this hospital visit personally     Scheduled Meds:  amoxicillin-clavulanate  1 tablet Oral Q12H   baclofen  5 mg Oral BID   chlorhexidine gluconate (MEDLINE KIT)  15 mL Mouth Rinse BID   Chlorhexidine Gluconate Cloth  6 each Topical Q0600   cyanocobalamin  1,000 mcg Intramuscular Daily   Followed  by   Derrill Memo ON 10/17/2020] vitamin B-12  1,000 mcg Oral Daily   darunavir-cobicistat  1 tablet Oral Q breakfast   dolutegravir  50 mg Oral Daily   doxycycline  100 mg Oral Q12H   enoxaparin (LOVENOX) injection  40 mg Subcutaneous Q24H   feeding supplement  237 mL Oral TID BM   mouth rinse  15 mL Mouth Rinse q12n4p   megestrol  800 mg Oral Daily   multivitamin with minerals  1 tablet Oral Daily   mupirocin ointment  1 application Nasal BID   nystatin  5 mL Mouth/Throat QID   rilpivirine  25 mg Oral Q breakfast   sodium chloride flush  10-40 mL Intracatheter Q12H   thiamine  100 mg Oral Daily   Continuous Infusions:  sodium chloride 10 mL/hr at 10/11/20 1601     LOS: 3 days        [name] Haydu Gerome Apley, MD

## 2020-10-13 NOTE — Progress Notes (Signed)
Physical Therapy Treatment Patient Details Name: [name] [name] MRN: [id number] DOB: 22-Oct-1960 Today's Date: 10/13/2020   History of Present Illness Patient is a 60 year old male who presented from SNF on 10/09/20 with concerns for encephalopathy. patient was found to have osteomylitis of sacrum Q9-4,TMLYY metabolic encephalopathy, and AKI. PMH: HIV, DVT LLE, anemia, rectal CA, septic emboli to brain, functional quadriplegia    PT Comments    Pt with some progress with decreased posterior lean in standing ; however, still unable to take steps.  Pt unable to state how he gets to w/c at facility, but he did lateral scoot with cues and min A.  Continue to progress as able with plan to return to facility   Recommendations for follow up therapy are one component of a multi-disciplinary discharge planning process, led by the attending physician.  Recommendations may be updated based on patient status, additional functional criteria and insurance authorization.  Follow Up Recommendations  SNF     Equipment Recommendations  None recommended by PT    Recommendations for Other Services       Precautions / Restrictions Precautions Precautions: Fall     Mobility  Bed Mobility Overal bed mobility: Needs Assistance Bed Mobility: Rolling;Supine to Sit;Sit to Supine Rolling: Min assist   Supine to sit: Mod assist;+2 for safety/equipment Sit to supine: Mod assist;+2 for safety/equipment   General bed mobility comments: Requiring assist for trunk and legs into and out of bed    Transfers Overall transfer level: Needs assistance Equipment used: Rolling walker (2 wheeled) Transfers: Sit to/from Stand;Lateral/Scoot Transfers Sit to Stand: Mod assist;+2 physical assistance        Lateral/Scoot Transfers: Min assist General transfer comment: Performed x 2 with cues for hand placement also required assist to position feet and block.  Mod A x 2 to rise and cues to stand upright.  Performed  lateral scoot at EOB with pt going short distance on his own but with min A able to scoot to bed rail  Ambulation/Gait                 Stairs             Wheelchair Mobility    Modified Rankin (Stroke Patients Only)       Balance Overall balance assessment: Needs assistance Sitting-balance support: Feet supported Sitting balance-Leahy Scale: Fair     Standing balance support: Bilateral upper extremity supported Standing balance-Leahy Scale: Poor Standing balance comment: Stood x 2 for 30-45 seconds with initial mod A progressed to min A of  2 for safety to maintain.  Cues to tuck buttock and increased weight on toes                            Cognition Arousal/Alertness: Awake/alert Behavior During Therapy: WFL for tasks assessed/performed Overall Cognitive Status: History of cognitive impairments - at baseline                                 General Comments: Pt only oriented to self and unable to answer PLOF      Exercises Other Exercises Other Exercises: 5x2 bil LE knee flex and ext AAROM    General Comments General comments (skin integrity, edema, etc.): Pt incontinent of stool on pad - cleaned in standing and changed pad      Pertinent Vitals/Pain Pain Assessment: Faces Faces  Pain Scale: Hurts even more Pain Location: when staff was assisting with hygiene on bottom after incontinence episode. Pain Descriptors / Indicators: Grimacing;Discomfort;Guarding Pain Intervention(s): Limited activity within patient's tolerance;Monitored during session    Home Living                      Prior Function            PT Goals (current goals can now be found in the care plan section) Progress towards PT goals: Progressing toward goals    Frequency    Min 2X/week      PT Plan Current plan remains appropriate    Co-evaluation              AM-PAC PT "6 Clicks" Mobility   Outcome Measure  Help needed  turning from your back to your side while in a flat bed without using bedrails?: A Little Help needed moving from lying on your back to sitting on the side of a flat bed without using bedrails?: A Lot Help needed moving to and from a bed to a chair (including a wheelchair)?: Total Help needed standing up from a chair using your arms (e.g., wheelchair or bedside chair)?: Total Help needed to walk in hospital room?: Total Help needed climbing 3-5 steps with a railing? : Total 6 Click Score: 9    End of Session Equipment Utilized During Treatment: Gait belt Activity Tolerance: Patient tolerated treatment well Patient left: in bed;with call bell/phone within reach;with nursing/sitter in room (on air mattress with all rails up) Nurse Communication: Mobility status PT Visit Diagnosis: Unsteadiness on feet (R26.81);Other abnormalities of gait and mobility (R26.89);Muscle weakness (generalized) (M62.81);Difficulty in walking, not elsewhere classified (R26.2);History of falling (Z91.81)     Time: 9470-9628 PT Time Calculation (min) (ACUTE ONLY): 22 min  Charges:  $Therapeutic Activity: 8-22 mins                     Abran Richard, PT Acute Rehab Services Pager [phone] [facility] [place] 10/13/2020, 3:03 PM

## 2020-10-14 ENCOUNTER — Ambulatory Visit: Payer: Medicare Other | Admitting: Infectious Diseases

## 2020-10-14 ENCOUNTER — Telehealth: Payer: Self-pay

## 2020-10-14 DIAGNOSIS — G9341 Metabolic encephalopathy: Secondary | ICD-10-CM | POA: Diagnosis not present

## 2020-10-14 DIAGNOSIS — D649 Anemia, unspecified: Secondary | ICD-10-CM | POA: Diagnosis not present

## 2020-10-14 DIAGNOSIS — N179 Acute kidney failure, unspecified: Secondary | ICD-10-CM | POA: Diagnosis not present

## 2020-10-14 DIAGNOSIS — G9340 Encephalopathy, unspecified: Secondary | ICD-10-CM | POA: Diagnosis not present

## 2020-10-14 DIAGNOSIS — I5032 Chronic diastolic (congestive) heart failure: Secondary | ICD-10-CM

## 2020-10-14 LAB — CULTURE, BLOOD (ROUTINE X 2): Culture: NO GROWTH

## 2020-10-14 LAB — SARS CORONAVIRUS 2 (TAT 6-24 HRS): SARS Coronavirus 2: NEGATIVE

## 2020-10-14 MED ORDER — DOXYCYCLINE HYCLATE 100 MG PO TABS: 100.0000 mg | ORAL_TABLET | Freq: Two times a day (BID) | ORAL | 0 refills | Status: AC

## 2020-10-14 MED ORDER — AMOXICILLIN-POT CLAVULANATE 875-125 MG PO TABS: 1.0000 | ORAL_TABLET | Freq: Two times a day (BID) | ORAL | 0 refills | Status: AC

## 2020-10-14 MED ORDER — BACLOFEN 5 MG PO TABS: 5.0000 mg | ORAL_TABLET | Freq: Two times a day (BID) | ORAL | 0 refills | Status: DC

## 2020-10-14 MED ORDER — THIAMINE HCL 100 MG PO TABS: 100.0000 mg | ORAL_TABLET | Freq: Every day | ORAL | 0 refills | Status: AC

## 2020-10-14 MED ORDER — CYANOCOBALAMIN 1000 MCG PO TABS: 1000.0000 ug | ORAL_TABLET | Freq: Every day | ORAL | 0 refills | Status: AC

## 2020-10-14 MED ORDER — CYANOCOBALAMIN 1000 MCG/ML IJ SOLN: 1000.0000 ug | Freq: Every day | INTRAMUSCULAR | 0 refills | Status: AC

## 2020-10-14 NOTE — Discharge Summary (Addendum)
Physician Discharge Summary  [name] [name] YBO:[id number] DOB: 03-06-1960 DOA: 10/09/2020  PCP: [name] Riches, MD  Admit date: 10/09/2020 Discharge date: 10/14/2020  Admitted From: ALF   Disposition: ALF  Recommendations for Outpatient Follow-up and new medication changes:  Follow up with Dr. Johnnye [name] as scheduled  Antibiotic therapy changed to Augmentin and doxycycline until 10.17.22 Discontinue metoclopramide and opiates.  Decreased baclofen to bid from tid.  Added Vitamin B12 and thiamine.   Home Health: no   Equipment/Devices: na    Discharge Condition: stable  CODE STATUS: DNR   Diet recommendation:  dysphagia 3 diet.   I spoke over the phone with the patient's sister about patient's  condition, plan of care, prognosis and all questions were addressed.   Brief/Interim Summary: [name] [name] was admitted to the hospital with the working diagnosis of acute metabolic/ toxic encephalopathy in the setting of chronic sacral osteomyelitis and severe calorie protein malnutrition.    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  Reported worsening symptoms for about 3 weeks.  Recent hospitalization May 2022. He has been receiving antibiotic therapy as an outpatient, vancomycin and Zosyn through PICC line. Patient has been receiving metoclopramide for nausea and baclofen for muscle spasms. On his initial physical examination blood pressure 121/83, heart rate 96, respiratory rate 22, oxygen saturation 99%, patient was alert and oriented to self, dry mucous membranes, lungs clear to auscultation bilaterally, heart S1-S2, present, rhythmic, abdomen soft, no lower  extremity edema.  Multiple pressure ulcers.  Dry sacral wound.   Venous pH 7.32, PCO2 38.4, PO2 20.8, bicarb 19.6, sodium 147, potassium 3.6, chloride 117, bicarb 19, glucose 90, BUN 38, creatinine 1.71.  White cell count 10.3, hemoglobin 10.5, hematocrit 32.9, platelets 147. SARS COVID-19 negative.   Urinalysis specific gravity 1.016, negative nitrates   Head CT negative for acute changes.  Chest radiograph no infiltrates.   Patient was placed on supportive medical therapy including intravenous fluids.  Neurology recommended high doses of IV thiamine. His mentation has improved and will be discharged to ALF.   Acute metabolic/toxic encephalopathy/parkinsonism's..  Patient was admitted to the medical ward, he had frequent neurochecks and his medications were adjusted.  Neurology was consulted, impression of metoclopramide induced encephalopathy, complicated with delirium, B12 and thiamine deficiency.  Metoclopramide was discontinued. Baclofen was reduced in dose, he has been tolerating well taste dose adjustment, will continue twice daily for now. Discontinue opiate analgesics. He was placed on thiamine and B12 with good toleration. Electroencephalography was performed showing no active seizures.  2.  Acute kidney injury chronic kidney disease stage IIIa, hypernatremia, hypokalemia, non-anion gap metabolic acidosis. Patient received supportive medical therapy with improvement of kidney function and electrolytes. His mentation improved along with his p.o. intake.  At discharge sodium 145, potassium 4.1, chloride 117, bicarb 18, glucose 101, BUN 34 and creatinine 1.24. Follow-up kidney function as an outpatient.  3.  Chronic sacral osteomyelitis, functional quadriplegia, dysphagia, chronic anemia, severe calorie protein malnutrition.  Bilateral stage II pressure ulcers, present on admission. Infectious disease was consulted, his antibiotic therapy was modified to Augmentin and  doxycycline, to complete 6 weeks of therapy, end date October 17. Aspiration precautions, dysphagia 3 diet.  Nutritional supplements. Continue local wound care.  His wound  has poor prognosis, high risk of recurrence considering patient's poor mobility and calorie protein malnutrition.  4.  HIV, thrush.  Patient was continued on antiretroviral therapy, he has been started on nystatin.  5.  Chronic diastolic heart failure.  No signs of acute decompensation.  Discharge Diagnoses:  Active Problems:   Human immunodeficiency virus (HIV) disease ([place])   Unspecified severe protein-calorie malnutrition ([place])   AKI (acute kidney injury) ([place])   Acute metabolic encephalopathy   Sacral osteomyelitis ([facility])   Chronic diastolic CHF (congestive heart failure) ([facility])   Anemia   Rigidity   Hypernatremia   Functional quadriplegia ([facility])   Pressure injury of skin   Acute encephalopathy    Discharge Instructions   Allergies as of 10/14/2020       Reactions   Collagen Rash   redness        Medication List     STOP taking these medications    diphenoxylate-atropine 2.5-0.025 MG tablet Commonly known as: LOMOTIL   metoCLOPramide 5 MG tablet Commonly known as: REGLAN   oxyCODONE 5 MG immediate release tablet Commonly known as: Oxy IR/ROXICODONE   Piperacillin Sod-Tazobactam So 3-0.375 g Solr       TAKE these medications    acetaminophen 325 MG tablet Commonly known as: TYLENOL Take 650 mg by mouth every 6 (six) hours as needed for mild pain or fever.   amoxicillin-clavulanate 875-125 MG tablet Commonly known as: AUGMENTIN Take 1 tablet by mouth every 12 (twelve) hours for 11 days.   Baclofen 5 MG Tabs Take 5 mg by mouth 2 (two) times daily. What changed:  when to take this additional instructions   Banatrol plus Pack Take 1 packet by mouth in the morning and at bedtime.   cyanocobalamin 1000 MCG/ML injection Commonly known as: (VITAMIN B-12) Inject 1 mL (1,000 mcg  total) into the muscle daily for 3 days. Start taking on: October 15, 2020   cyanocobalamin 1000 MCG tablet Take 1 tablet (1,000 mcg total) by mouth daily. Start taking on: October 17, 2020   doxycycline 100 MG tablet Commonly known as: VIBRA-TABS Take 1 tablet (100 mg total) by mouth every 12 (twelve) hours for 11 days.   Edurant 25 MG Tabs tablet Generic drug: rilpivirine TAKE 1 TABLET (25 MG TOTAL) BY MOUTH DAILY WITH BREAKFAST What changed:  how much to take how to take this when to take this additional instructions   Ensure Take 237 mLs by mouth in the morning and at bedtime.   feeding supplement (PRO-STAT SUGAR FREE 64) Liqd Take 30 mLs by mouth in the morning and at bedtime.   loperamide 2 MG tablet Commonly known as: IMODIUM A-D Take 2-4 mg by mouth as needed for diarrhea or loose stools (max dose of 16mg /24 hours). 4mg  after 1st loose stool and 2mg  after each subsequent BM   megestrol 625 MG/5ML suspension Commonly known as: Megace ES Take 5 mLs (625 mg total) by mouth daily.   Prezcobix 800-150 MG tablet Generic drug: darunavir-cobicistat TAKE 1 TABLET BY MOUTH DAILY . SWALLOW WHOLE, DO NOT CRUSH, BREAK, OR CHEW TABLETS. TAKE WITH FOOD What changed:  how much to take how to take this when to take this additional instructions   thiamine 100 MG tablet Take 1 tablet (100 mg total) by mouth daily. Start taking on: October 15, 2020   Tivicay 50 MG tablet Generic drug: dolutegravir TAKE 1 TABLET (50 MG TOTAL) BY MOUTH DAILY What changed: how much to take   vitamin  C 500 MG tablet Commonly known as: ASCORBIC ACID Take 1,000 mg by mouth daily.        Allergies  Allergen Reactions   Collagen Rash    redness    Consultations: Neurology    Procedures/Studies: CT Head Wo Contrast  Result Date: 10/09/2020 CLINICAL DATA:  Altered mental status EXAM: CT HEAD WITHOUT CONTRAST TECHNIQUE: Contiguous axial images were obtained from the base of the skull  through the vertex without intravenous contrast. COMPARISON:  CT head dated 05/13/2020. FINDINGS: Motion artifact limits the sensitivity of the exam. Brain: No evidence of acute infarction, hemorrhage, hydrocephalus, extra-axial collection or mass lesion/mass effect. There is mild cerebral volume loss with associated ex vacuo dilatation. Periventricular white matter hypoattenuation likely represents chronic small vessel ischemic disease. Vascular: No hyperdense vessel or unexpected calcification. Skull: Normal. Negative for fracture or focal lesion. Sinuses/Orbits: No acute finding. Other: None. IMPRESSION: No acute intracranial process. Electronically Signed   By: Zerita Boers M.D.   On: 10/09/2020 17:57   MR BRAIN WO CONTRAST  Result Date: 10/10/2020 CLINICAL DATA:  Neuro deficit with stroke suspected EXAM: MRI HEAD WITHOUT CONTRAST TECHNIQUE: Multiplanar, multiecho pulse sequences of the brain and surrounding structures were obtained without intravenous contrast. COMPARISON:  Head CT from yesterday FINDINGS: Brain: No acute infarction, hemorrhage, hydrocephalus, extra-axial collection or mass lesion. Advanced, especially for age, brain atrophy. Chronic white matter disease and superior and anterior right frontal encephalomalacia. Remote microhemorrhages in the peripheral brain, likely related to previously documented embolic disease. Most of these areas are occult by today's study. Vascular: Preserved flow voids and vascular enhancements. Skull and upper cervical spine: Normal marrow signal Sinuses/Orbits: No emergent finding. Bilateral cataract resection. Retention cysts in the maxillary sinuses. IMPRESSION: 1. No acute finding. 2. Chronic ischemic injury and advanced brain atrophy. Electronically Signed   By: Jorje Guild M.D.   On: 10/10/2020 12:08   DG Chest Port 1 View  Result Date: 10/09/2020 CLINICAL DATA:  Questionable sepsis. EXAM: PORTABLE CHEST 1 VIEW COMPARISON:  Chest x-ray 05/17/2020.  FINDINGS: Right-sided central venous catheter tip projects over the mid SVC. The cardiomediastinal silhouette is stable and within normal limits. There is no lung consolidation, pleural effusion or pneumothorax. There are minimal atelectatic changes in the lung bases. IMPRESSION: No active disease. Electronically Signed   By: Ronney Asters M.D.   On: 10/09/2020 16:21   EEG adult  Result Date: 10/11/2020 Lora Havens, MD     10/11/2020  1:11 PM Patient Name: [name] [name] MRN: [id number] Epilepsy Attending: Lora Havens Referring Physician/Provider: Clance Boll, NP Date: 10/11/2020 Duration: 22.20 mins Patient history: 60 yo male presenting from SNF for increased confusion x 3 days.  EEG to evaluate for seizures. Level of alertness: Awake, drowsy AEDs during EEG study: None Technical aspects: This EEG study was done with scalp electrodes positioned according to the 10-20 International system of electrode placement. Electrical activity was acquired at a sampling rate of 500Hz  and reviewed with a high frequency filter of 70Hz  and a low frequency filter of 1Hz . EEG data were recorded continuously and digitally stored. Description: The posterior dominant rhythm consists of 8 Hz activity of moderate voltage (25-35 uV) seen predominantly in posterior head regions, symmetric and reactive to eye opening and eye closing. Drowsiness was characterized by attenuation of posterior dominant rhythm. EEG showed intermittent generalized 3 to 6 Hz theta-delta slowing.  Hyperventilation and photic stimulation were not performed.   ABNORMALITY - Intermittent slow, generalized IMPRESSION: This study is  suggestive of mild diffuse encephalopathy, nonspecific etiology. No seizures or epileptiform discharges were seen throughout the recording. Priyanka Barbra Sarks     Subjective: Patient is feeling better, no nausea, no vomiting, his back pain is controlled.  No significant confusion.  Discharge Exam: Vitals:   10/14/20  0353 10/14/20 0800  BP: 127/82 108/74  Pulse: 71 63  Resp: 18 16  Temp: 98.6 F (37 C) 99.8 F (37.7 C)  SpO2: 100% 100%   Vitals:   10/13/20 1300 10/13/20 1900 10/14/20 0353 10/14/20 0800  BP: 124/70 129/85 127/82 108/74  Pulse: 85 74 71 63  Resp: 18 18 18 16   Temp: 98.3 F (36.8 C) 99.3 F (37.4 C) 98.6 F (37 C) 99.8 F (37.7 C)  TempSrc: Oral Oral Oral Oral  SpO2:  99% 100% 100%  Weight:      Height:        General: Not in pain or dyspnea  Neurology: Awake and alert, non focal  E ENT: no pallor, no icterus, oral mucosa moist Cardiovascular: No JVD. S1-S2 present, rhythmic, no gallops, rubs, or murmurs. No lower extremity edema. Pulmonary: positive breath sounds bilaterally, adequate air movement, no wheezing, rhonchi or rales. Gastrointestinal. Abdomen flat, no organomegaly, non tender, no rebound or guarding Skin. Pressure ulcers with dressing in place, bilateral boots in place.  Musculoskeletal: no joint deformities   The results of significant diagnostics from this hospitalization (including imaging, microbiology, ancillary and laboratory) are listed below for reference.     Microbiology: Recent Results (from the past 240 hour(s))  Urine Culture     Status: None   Collection Time: 10/09/20  6:00 PM   Specimen: In/Out Cath Urine  Result Value Ref Range Status   Specimen Description   Final    IN/OUT CATH URINE Performed at [facility], [place] [address], [place], [place] [address]    Special Requests   Final    NONE Performed at [facility], [place] [address] [place], [place] [address]    Culture   Final    NO GROWTH Performed at [facility], [place] [address], [place], [place] [address]    Report Status 10/11/2020 FINAL  Final  Blood Culture (routine x 2)     Status: None   Collection Time: 10/09/20  6:10 PM   Specimen: BLOOD RIGHT HAND  Result Value Ref Range Status   Specimen Description   Final    BLOOD  RIGHT HAND Performed at [facility], [place] [address], [place], [place] [address]    Special Requests   Final    BOTTLES DRAWN AEROBIC AND ANAEROBIC Blood Culture results may not be optimal due to an inadequate volume of blood received in culture bottles Performed at [place] [address], [place], [place] [address]    Culture   Final    NO GROWTH 5 DAYS Performed at [facility], [place] [address], [place], [place] [address]    Report Status 10/14/2020 FINAL  Final  Resp Panel by RT-PCR (Flu A&B, Covid) Nasopharyngeal Swab     Status: None   Collection Time: 10/09/20  6:25 PM   Specimen: Nasopharyngeal Swab; Nasopharyngeal(NP) swabs in vial transport medium  Result Value Ref Range Status   SARS Coronavirus 2 by RT PCR NEGATIVE NEGATIVE Final    Comment: (NOTE) SARS-CoV-2 target nucleic acids are NOT DETECTED.  The SARS-CoV-2 RNA is generally detectable in upper respiratory specimens during the acute  phase of infection. The lowest concentration of SARS-CoV-2 viral copies this assay can detect is 138 copies/mL. A negative result does not preclude SARS-Cov-2 infection and should not be used as the sole basis for treatment or other patient management decisions. A negative result may occur with  improper specimen collection/handling, submission of specimen other than nasopharyngeal swab, presence of viral mutation(s) within the areas targeted by this assay, and inadequate number of viral copies(<138 copies/mL). A negative result must be combined with clinical observations, patient history, and epidemiological information. The expected result is Negative.  Fact Sheet for Patients:  EntrepreneurPulse.com.au  Fact Sheet for Healthcare Providers:  IncredibleEmployment.be  This test is no t yet approved or cleared by the [place] FDA and  has been authorized for detection and/or diagnosis of SARS-CoV-2 by FDA  under an Emergency Use Authorization (EUA). This EUA will remain  in effect (meaning this test can be used) for the duration of the COVID-19 declaration under Section 564(b)(1) of the Act, 21 U.S.C.section 360bbb-3(b)(1), unless the authorization is terminated  or revoked sooner.       Influenza A by PCR NEGATIVE NEGATIVE Final   Influenza B by PCR NEGATIVE NEGATIVE Final    Comment: (NOTE) The Xpert Xpress SARS-CoV-2/FLU/RSV plus assay is intended as an aid in the diagnosis of influenza from Nasopharyngeal swab specimens and should not be used as a sole basis for treatment. Nasal washings and aspirates are unacceptable for Xpert Xpress SARS-CoV-2/FLU/RSV testing.  Fact Sheet for Patients: EntrepreneurPulse.com.au  Fact Sheet for Healthcare Providers: IncredibleEmployment.be  This test is not yet approved or cleared by the [place] FDA and has been authorized for detection and/or diagnosis of SARS-CoV-2 by FDA under an Emergency Use Authorization (EUA). This EUA will remain in effect (meaning this test can be used) for the duration of the COVID-19 declaration under Section 564(b)(1) of the Act, 21 U.S.C. section 360bbb-3(b)(1), unless the authorization is terminated or revoked.  Performed at [facility], [place] [address], [place], [place] [address]   MRSA Next Gen by PCR, Nasal     Status: Abnormal   Collection Time: 10/09/20 10:50 PM   Specimen: Nasal Mucosa; Nasal Swab  Result Value Ref Range Status   MRSA by PCR Next Gen DETECTED (A) NOT DETECTED Final    Comment: RESULT CALLED TO, READ BACK BY AND VERIFIED WITH: Jeanine Luz, RN 10/10/20 0743 KDS (NOTE) The GeneXpert MRSA Assay (FDA approved for NASAL specimens only), is one component of a comprehensive MRSA colonization surveillance program. It is not intended to diagnose MRSA infection nor to guide or monitor treatment for MRSA infections. Test  performance is not FDA approved in patients less than 71 years old. Performed at [facility], [place] [address], [place], [place] [address]   Blood Culture (routine x 2)     Status: None (Preliminary result)   Collection Time: 10/09/20 11:32 PM   Specimen: BLOOD  Result Value Ref Range Status   Specimen Description   Final    BLOOD LEFT ANTECUBITAL Performed at [place] [address], [place], [place] [address]    Special Requests   Final    BOTTLES DRAWN AEROBIC AND ANAEROBIC Blood Culture adequate volume Performed at [place] [address], [place], [place] [address]    Culture   Final    NO GROWTH 4 DAYS Performed at [facility], [place] [address], [place], [place] [address]    Report Status  PENDING  Incomplete  SARS CORONAVIRUS 2 (TAT 6-24 HRS) Nasopharyngeal Nasopharyngeal Swab     Status: None   Collection Time: 10/13/20  3:39 PM   Specimen: Nasopharyngeal Swab  Result Value Ref Range Status   SARS Coronavirus 2 NEGATIVE NEGATIVE Final    Comment: (NOTE) SARS-CoV-2 target nucleic acids are NOT DETECTED.  The SARS-CoV-2 RNA is generally detectable in upper and lower respiratory specimens during the acute phase of infection. Negative results do not preclude SARS-CoV-2 infection, do not rule out co-infections with other pathogens, and should not be used as the sole basis for treatment or other patient management decisions. Negative results must be combined with clinical observations, patient history, and epidemiological information. The expected result is Negative.  Fact Sheet for Patients: SugarRoll.be  Fact Sheet for Healthcare Providers: https://www.woods-mathews.com/  This test is not yet approved or cleared by the [place] FDA and  has been authorized for detection and/or diagnosis of SARS-CoV-2 by FDA under an Emergency Use Authorization (EUA).  This EUA will remain  in effect (meaning this test can be used) for the duration of the COVID-19 declaration under Se ction 564(b)(1) of the Act, 21 U.S.C. section 360bbb-3(b)(1), unless the authorization is terminated or revoked sooner.  Performed at [facility], [place] [address], [place], [place] [address]      Labs: BNP (last 3 results) Recent Labs    05/13/20 0120  BNP 6,734.1*   Basic Metabolic Panel: Recent Labs  Lab 10/09/20 2332 10/10/20 0308 10/10/20 1348 10/11/20 0434 10/12/20 0445 10/13/20 0608  NA  --  147* 142 142 144 145  K  --  3.6 3.6 3.4* 3.4* 4.1  CL  --  118* 117* 117* 118* 117*  CO2  --  21* 22 21* 21* 18*  GLUCOSE  --  100* 108* 87 101* 101*  BUN  --  42* 36* 36* 39* 34*  CREATININE  --  1.70* 1.72* 1.87* 1.77* 1.25*  CALCIUM  --  9.4 9.2 9.2 9.5 9.4  MG 1.8 2.5*  --  1.9 1.7  --   PHOS 3.6 3.3  --   --   --   --    Liver Function Tests: Recent Labs  Lab 10/09/20 1452 10/10/20 0308  AST 30 26  ALT 35 33  ALKPHOS 37* 32*  BILITOT 0.9 0.5  PROT 6.7 5.8*  ALBUMIN 3.3* 2.8*   No results for input(s): LIPASE, AMYLASE in the last 168 hours. Recent Labs  Lab 10/09/20 2332  AMMONIA 21   CBC: Recent Labs  Lab 10/09/20 1452 10/10/20 0308 10/11/20 0434 10/12/20 0445  WBC 10.3 7.3 7.8 7.8  NEUTROABS 7.5 4.9 5.2 4.9  HGB 10.5* 8.9* 9.1* 8.8*  HCT 32.9* 27.6* 27.7* 27.7*  MCV 104.4* 104.2* 103.7* 103.4*  PLT 147* 132* 125* 123*   Cardiac Enzymes: Recent Labs  Lab 10/09/20 2332 10/10/20 0308 10/11/20 0434  CKTOTAL 74 56 42*   BNP: Invalid input(s): POCBNP CBG: No results for input(s): GLUCAP in the last 168 hours. D-Dimer No results for input(s): DDIMER in the last 72 hours. Hgb A1c No results for input(s): HGBA1C in the last 72 hours. Lipid Profile No results for input(s): CHOL, HDL, LDLCALC, TRIG, CHOLHDL, LDLDIRECT in the last 72 hours. Thyroid function studies No results for input(s): TSH, T4TOTAL, T3FREE,  THYROIDAB in the last 72 hours.  Invalid input(s): FREET3 Anemia work up No results for input(s): VITAMINB12, FOLATE, FERRITIN, TIBC, IRON, RETICCTPCT in the last 72 hours.  Urinalysis    Component Value Date/Time   COLORURINE YELLOW 10/09/2020 1800   APPEARANCEUR CLEAR 10/09/2020 1800   LABSPEC 1.016 10/09/2020 1800   LABSPEC 1.030 04/11/2007 1446   PHURINE 5.0 10/09/2020 1800   GLUCOSEU NEGATIVE 10/09/2020 1800   HGBUR NEGATIVE 10/09/2020 1800   BILIRUBINUR NEGATIVE 10/09/2020 1800   BILIRUBINUR Negative 04/11/2007 1446   KETONESUR NEGATIVE 10/09/2020 1800   PROTEINUR NEGATIVE 10/09/2020 1800   UROBILINOGEN 0.2 06/10/2014 1303   NITRITE NEGATIVE 10/09/2020 1800   LEUKOCYTESUR NEGATIVE 10/09/2020 1800   LEUKOCYTESUR Negative 04/11/2007 1446   Sepsis Labs Invalid input(s): PROCALCITONIN,  WBC,  LACTICIDVEN Microbiology Recent Results (from the past 240 hour(s))  Urine Culture     Status: None   Collection Time: 10/09/20  6:00 PM   Specimen: In/Out Cath Urine  Result Value Ref Range Status   Specimen Description   Final    IN/OUT CATH URINE Performed at [facility], [place] [address] [place], [place] [address]    Special Requests   Final    NONE Performed at [facility], [place] [address] [place], [place] [address]    Culture   Final    NO GROWTH Performed at [facility], [place] [address], [place], [place] [address]    Report Status 10/11/2020 FINAL  Final  Blood Culture (routine x 2)     Status: None   Collection Time: 10/09/20  6:10 PM   Specimen: BLOOD RIGHT HAND  Result Value Ref Range Status   Specimen Description   Final    BLOOD RIGHT HAND Performed at [facility], [place] [address], [place], [place] [address]    Special Requests   Final    BOTTLES DRAWN AEROBIC AND ANAEROBIC Blood Culture results may not be optimal due to an inadequate volume of blood received in culture bottles Performed at [place] [address], [place], [place] [address]    Culture   Final    NO GROWTH 5 DAYS Performed at [facility], [place] [address], [place], [place] [address]    Report Status 10/14/2020 FINAL  Final  Resp Panel by RT-PCR (Flu A&B, Covid) Nasopharyngeal Swab     Status: None   Collection Time: 10/09/20  6:25 PM   Specimen: Nasopharyngeal Swab; Nasopharyngeal(NP) swabs in vial transport medium  Result Value Ref Range Status   SARS Coronavirus 2 by RT PCR NEGATIVE NEGATIVE Final    Comment: (NOTE) SARS-CoV-2 target nucleic acids are NOT DETECTED.  The SARS-CoV-2 RNA is generally detectable in upper respiratory specimens during the acute phase of infection. The lowest concentration of SARS-CoV-2 viral copies this assay can detect is 138 copies/mL. A negative result does not preclude SARS-Cov-2 infection and should not be used as the sole basis for treatment or other patient management decisions. A negative result may occur with  improper specimen collection/handling, submission of specimen other than nasopharyngeal swab, presence of viral mutation(s) within the areas targeted by this assay, and inadequate number of viral copies(<138 copies/mL). A negative result must be combined with clinical observations, patient history, and epidemiological information. The expected result is Negative.  Fact Sheet for Patients:  EntrepreneurPulse.com.au  Fact Sheet for Healthcare Providers:  IncredibleEmployment.be  This test is no t yet approved or cleared by the [place] FDA and  has been authorized for detection and/or diagnosis of SARS-CoV-2 by FDA under an Emergency Use Authorization (EUA). This EUA will remain  in  effect (meaning this test can be used) for the duration of the COVID-19 declaration under Section 564(b)(1) of the Act, 21 U.S.C.section 360bbb-3(b)(1), unless the authorization is terminated  or revoked sooner.        Influenza A by PCR NEGATIVE NEGATIVE Final   Influenza B by PCR NEGATIVE NEGATIVE Final    Comment: (NOTE) The Xpert Xpress SARS-CoV-2/FLU/RSV plus assay is intended as an aid in the diagnosis of influenza from Nasopharyngeal swab specimens and should not be used as a sole basis for treatment. Nasal washings and aspirates are unacceptable for Xpert Xpress SARS-CoV-2/FLU/RSV testing.  Fact Sheet for Patients: EntrepreneurPulse.com.au  Fact Sheet for Healthcare Providers: IncredibleEmployment.be  This test is not yet approved or cleared by the [place] FDA and has been authorized for detection and/or diagnosis of SARS-CoV-2 by FDA under an Emergency Use Authorization (EUA). This EUA will remain in effect (meaning this test can be used) for the duration of the COVID-19 declaration under Section 564(b)(1) of the Act, 21 U.S.C. section 360bbb-3(b)(1), unless the authorization is terminated or revoked.  Performed at [facility], [place] [address] [place], [place] [address]   MRSA Next Gen by PCR, Nasal     Status: Abnormal   Collection Time: 10/09/20 10:50 PM   Specimen: Nasal Mucosa; Nasal Swab  Result Value Ref Range Status   MRSA by PCR Next Gen DETECTED (A) NOT DETECTED Final    Comment: RESULT CALLED TO, READ BACK BY AND VERIFIED WITH: Jeanine Luz, RN 10/10/20 0743 KDS (NOTE) The GeneXpert MRSA Assay (FDA approved for NASAL specimens only), is one component of a comprehensive MRSA colonization surveillance program. It is not intended to diagnose MRSA infection nor to guide or monitor treatment for MRSA infections. Test performance is not FDA approved in patients less than 39 years old. Performed at [facility], [place] [address] [place], [place] [address]   Blood Culture (routine x 2)     Status: None (Preliminary result)   Collection Time: 10/09/20 11:32 PM   Specimen: BLOOD  Result Value  Ref Range Status   Specimen Description   Final    BLOOD LEFT ANTECUBITAL Performed at [place] [address] [place], [place] [address]    Special Requests   Final    BOTTLES DRAWN AEROBIC AND ANAEROBIC Blood Culture adequate volume Performed at [facility] [address], [place], [place] [address]    Culture   Final    NO GROWTH 4 DAYS Performed at [facility], [place] [address] [place], [place] [address]    Report Status PENDING  Incomplete  SARS CORONAVIRUS 2 (TAT 6-24 HRS) Nasopharyngeal Nasopharyngeal Swab     Status: None   Collection Time: 10/13/20  3:39 PM   Specimen: Nasopharyngeal Swab  Result Value Ref Range Status   SARS Coronavirus 2 NEGATIVE NEGATIVE Final    Comment: (NOTE) SARS-CoV-2 target nucleic acids are NOT DETECTED.  The SARS-CoV-2 RNA is generally detectable in upper and lower respiratory specimens during the acute phase of infection. Negative results do not preclude SARS-CoV-2 infection, do not rule out co-infections with other pathogens, and should not be used as the sole basis for treatment or other patient management decisions. Negative results must be combined with clinical observations, patient history, and epidemiological information. The expected result is Negative.  Fact Sheet for Patients: SugarRoll.be  Fact Sheet for Healthcare Providers: https://www.woods-mathews.com/  This test is not yet approved or cleared by the  [place] Architectural technologist and  has been authorized for detection and/or diagnosis of SARS-CoV-2 by FDA under an Print production planner (EUA). This EUA will remain  in effect (meaning this test can be used) for the duration of the COVID-19 declaration under Se ction 564(b)(1) of the Act, 21 U.S.C. section 360bbb-3(b)(1), unless the authorization is terminated or revoked sooner.  Performed at [facility], [place] [address],  [place], [place] [address]      Time coordinating discharge: 45 minutes  SIGNED:   Tawni Millers, MD  [facility] 10/14/2020, 11:05 AM

## 2020-10-14 NOTE — Progress Notes (Signed)
Report given to [facility] health care rehab. All questions were answered.

## 2020-10-14 NOTE — Telephone Encounter (Signed)
Patient's sister called concerned that hospital provider had stopped IV antibiotics and switched the patient to orals. She wanted to make sure [name] was on board with the plan. Relayed to her that two of Dr. Algis Downs partners were involved in that decision and signed off on the plan.   Beryle Flock, RN

## 2020-10-14 NOTE — NC FL2 (Signed)
[place] LEVEL OF CARE SCREENING TOOL     IDENTIFICATION  Patient Name: [name] [name] Birthdate: [birth year] Sex: male Admission Date (Current Location): 10/09/2020  [facility] and [place] Number:  Herbalist and Address:  [facility],  [place], [place]      Provider Number: [id number]  Attending Physician Name and Address:  Tawni Millers,*  Relative Name and Phone Number:  [name] (Sister)   [phone]    Current Level of Care: Hospital Recommended Level of Care: Nursing Facility Prior Approval Number:    Date Approved/Denied:   PASRR Number:    Discharge Plan: SNF    Current Diagnoses: Patient Active Problem List   Diagnosis Date Noted   Pressure injury of skin 10/10/2020   Acute encephalopathy 42/87/6811   Acute metabolic encephalopathy 57/26/2035   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   Chronic diastolic CHF (congestive heart failure) ([place]) 10/09/2020   Anemia 10/09/2020   Rigidity 10/09/2020   Hypernatremia 10/09/2020   Functional quadriplegia ([place]) 10/09/2020   Onychomycosis 08/19/2020   Penile ulcer 08/19/2020   Malnutrition of moderate degree 05/14/2020   AKI (acute kidney injury) ([place]) 05/13/2020   Lactic acidosis 05/13/2020   SDH (subdural hematoma) 05/13/2020   Hypothermia due to exposure 05/13/2020   Septic shock ([place]) 05/13/2020   Cellulitis 05/13/2020   Elevated brain natriuretic peptide (BNP) level 05/13/2020   Volume overload 05/13/2020   Dehydration 05/13/2020   Decubitus ulcer 05/13/2020   Subdural empyema    Streptococcal bacteremia    Rectal bleeding 02/20/2020   Abnormal digital rectal exam 02/20/2020   History of anal cancer 02/20/2020   Poor dentition 01/16/2017   Renal failure, chronic 06/10/2014   Septic arthritis of hip ([place]) 07/31/2013   Decubitus ulcer of sacral region, stage 2 ([place]) 07/31/2013   Protein-calorie malnutrition, severe ([place]) 07/31/2013    Pain in limb 07/23/2013   Inguinal adenopathy 02/18/2013   Enlargement of lymph nodes 01/13/2013   Urethral stricture 09/05/2007   ANEMIA, CHRONIC 03/20/2007   Unspecified severe protein-calorie malnutrition ([place]) 02/14/2007   DVT 08/22/2006   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[place]) 05/14/2006   ABSCESS, RECTUM 04/11/2006   Thrush of mouth and esophagus ([place]) 02/16/2006   DIARRHEA 02/16/2006   Human immunodeficiency virus (HIV) disease ([place]) 10/19/2005   MOLLUSCUM CONTAGIOSUM 10/19/2005   GOUT 10/19/2005   HEPATITIS B, HX OF 10/19/2005   HERPES ZOSTER, HX OF 10/19/2005    Orientation RESPIRATION BLADDER Height & Weight     Self  Normal Incontinent Weight: 56.6 kg Height:  6' 2"  (188 cm)  BEHAVIORAL SYMPTOMS/MOOD NEUROLOGICAL BOWEL NUTRITION STATUS      Incontinent Diet (see d/c summary)  AMBULATORY STATUS COMMUNICATION OF NEEDS Skin   Extensive Assist Verbally Other (Comment) (Pressure Injury, R Heel, Mid Sacrum; Wound, Bi-lateral feet, lateral, Rectum)                       Personal Care Assistance Level of Assistance  Feeding, Bathing, Dressing Bathing Assistance: Maximum assistance Feeding assistance: Limited assistance Dressing Assistance: Maximum assistance     Functional Limitations Info  Sight, Hearing, Speech Sight Info: Adequate Hearing Info: Adequate Speech Info: Adequate    SPECIAL CARE FACTORS FREQUENCY                       Contractures Contractures Info: Not present    Additional Factors Info  Code Status, Allergies Code Status Info: DNR  Allergies Info: collagen           Current Medications (10/14/2020):  This is the current hospital active medication list Current Facility-Administered Medications  Medication Dose Route Frequency Provider Last Rate Last Admin   0.9 %  sodium chloride infusion   Intravenous PRN Dwyane Dee, MD 10 mL/hr at 10/11/20 1601 Infusion Verify at 10/11/20 1601   acetaminophen (TYLENOL) tablet 650 mg  650 mg  Oral Q6H PRN Toy Baker, MD   650 mg at 10/11/20 2222   Or   acetaminophen (TYLENOL) suppository 650 mg  650 mg Rectal Q6H PRN Toy Baker, MD       amoxicillin-clavulanate (AUGMENTIN) 875-125 MG per tablet 1 tablet  1 tablet Oral Q12H Arrien, Jimmy Picket, MD   1 tablet at 10/14/20 0934   baclofen (LIORESAL) tablet 5 mg  5 mg Oral BID Greta Doom, MD   5 mg at 10/14/20 0935   chlorhexidine gluconate (MEDLINE KIT) (PERIDEX) 0.12 % solution 15 mL  15 mL Mouth Rinse BID Toy Baker, MD   15 mL at 10/14/20 0935   Chlorhexidine Gluconate Cloth 2 % PADS 6 each  6 each Topical Q0600 Toy Baker, MD   6 each at 10/14/20 8768   cyanocobalamin ((VITAMIN B-12)) injection 1,000 mcg  1,000 mcg Intramuscular Daily Dwyane Dee, MD   1,000 mcg at 10/14/20 0940   Followed by   Derrill Memo ON 10/17/2020] vitamin B-12 (CYANOCOBALAMIN) tablet 1,000 mcg  1,000 mcg Oral Daily Dwyane Dee, MD       darunavir-cobicistat (PREZCOBIX) 800-150 MG per tablet 1 tablet  1 tablet Oral Q breakfast Polly Cobia, RPH   1 tablet at 10/14/20 0939   dolutegravir (TIVICAY) tablet 50 mg  50 mg Oral Daily Doutova, Nyoka Lint, MD   50 mg at 10/14/20 0939   doxycycline (VIBRA-TABS) tablet 100 mg  100 mg Oral Q12H Arrien, Jimmy Picket, MD   100 mg at 10/14/20 0934   enoxaparin (LOVENOX) injection 40 mg  40 mg Subcutaneous Q24H Tawni Millers, MD   40 mg at 10/13/20 1311   feeding supplement (ENSURE ENLIVE / ENSURE PLUS) liquid 237 mL  237 mL Oral TID BM Arrien, Jimmy Picket, MD   237 mL at 10/14/20 0935   loperamide (IMODIUM) capsule 2 mg  2 mg Oral Q4H PRN Dwyane Dee, MD   2 mg at 10/10/20 1836   MEDLINE mouth rinse  15 mL Mouth Rinse q12n4p Doutova, Anastassia, MD   15 mL at 10/13/20 1258   megestrol (MEGACE) 400 MG/10ML suspension 800 mg  800 mg Oral Daily Toy Baker, MD   800 mg at 10/14/20 1157   multivitamin with minerals tablet 1 tablet  1 tablet Oral Daily  Arrien, Jimmy Picket, MD   1 tablet at 10/14/20 0940   mupirocin ointment (BACTROBAN) 2 % 1 application  1 application Nasal BID Dwyane Dee, MD   1 application at 26/20/35 0935   nystatin (MYCOSTATIN) 100000 UNIT/ML suspension 500,000 Units  5 mL Mouth/Throat QID Toy Baker, MD   500,000 Units at 10/14/20 0938   rilpivirine (EDURANT) tablet 25 mg  25 mg Oral Q breakfast Doutova, Anastassia, MD   25 mg at 10/14/20 0939   sodium chloride flush (NS) 0.9 % injection 10-40 mL  10-40 mL Intracatheter Q12H Arrien, Jimmy Picket, MD   10 mL at 10/14/20 0942   sodium chloride flush (NS) 0.9 % injection 10-40 mL  10-40 mL Intracatheter PRN Arrien, Jimmy Picket, MD  thiamine tablet 100 mg  100 mg Oral Daily Arrien, Jimmy Picket, MD   100 mg at 10/14/20 0935     Discharge Medications: Please see discharge summary for a list of discharge medications.  Relevant Imaging Results:  Relevant Lab Results:   Additional Information Brundidge, Jarrell

## 2020-10-14 NOTE — TOC Transition Note (Signed)
Transition of Care [facility]) - CM/SW Discharge Note   Patient Details  Name: [name] [name] MRN: [id number] Date of Birth: December 16, 1960  Transition of [facility]) CM/SW Contact:  Trish Mage, LCSW Phone Number: 10/14/2020, 10:52 AM   Clinical Narrative:   Patient who is medically stable will return to [facility].  Family alerted. PTAR arranged.  Nursing, please call report to [phone].  TOC sign off.    Final next level of care: Skilled Nursing Facility Barriers to Discharge: No Barriers Identified   Patient Goals and CMS Choice        Discharge Placement                       Discharge Plan and Services                                     Social Determinants of Health (SDOH) Interventions     Readmission Risk Interventions Readmission Risk Prevention Plan 05/28/2020  Transportation Screening Complete  PCP or Specialist Appt within 3-5 Days Complete  HRI or [employer] Complete  Social Work Consult for [place] Planning/Counseling Complete  Palliative Care Screening Complete  Medication Review Press photographer) Complete  Some recent data might be hidden

## 2020-10-14 NOTE — Care Management Important Message (Signed)
Important Message  Patient Details IM Letter given to Patients Sister [name]. Name: SELVIN YUN MRN: [id number] Date of Birth: [birth year]   Medicare Important Message Given:  Yes     [name] 10/14/2020, 12:25 PM

## 2020-10-15 LAB — CULTURE, BLOOD (ROUTINE X 2)
Culture: NO GROWTH
Special Requests: ADEQUATE

## 2020-10-27 ENCOUNTER — Other Ambulatory Visit: Payer: Self-pay

## 2020-10-27 ENCOUNTER — Ambulatory Visit (INDEPENDENT_AMBULATORY_CARE_PROVIDER_SITE_OTHER): Payer: Medicare Other

## 2020-10-27 ENCOUNTER — Other Ambulatory Visit: Payer: Self-pay | Admitting: Infectious Diseases

## 2020-10-27 ENCOUNTER — Encounter: Payer: Self-pay | Admitting: Infectious Diseases

## 2020-10-27 ENCOUNTER — Ambulatory Visit (INDEPENDENT_AMBULATORY_CARE_PROVIDER_SITE_OTHER): Payer: Medicare Other | Admitting: Infectious Diseases

## 2020-10-27 VITALS — BP 147/99 | HR 115 | Temp 98.1°F | Resp 16

## 2020-10-27 DIAGNOSIS — B3781 Candidal esophagitis: Secondary | ICD-10-CM

## 2020-10-27 DIAGNOSIS — M4628 Osteomyelitis of vertebra, sacral and sacrococcygeal region: Secondary | ICD-10-CM | POA: Diagnosis not present

## 2020-10-27 DIAGNOSIS — E43 Unspecified severe protein-calorie malnutrition: Secondary | ICD-10-CM

## 2020-10-27 DIAGNOSIS — Z23 Encounter for immunization: Secondary | ICD-10-CM

## 2020-10-27 DIAGNOSIS — B370 Candidal stomatitis: Secondary | ICD-10-CM

## 2020-10-27 DIAGNOSIS — G9341 Metabolic encephalopathy: Secondary | ICD-10-CM

## 2020-10-27 DIAGNOSIS — K089 Disorder of teeth and supporting structures, unspecified: Secondary | ICD-10-CM | POA: Diagnosis not present

## 2020-10-27 DIAGNOSIS — R197 Diarrhea, unspecified: Secondary | ICD-10-CM

## 2020-10-27 DIAGNOSIS — B20 Human immunodeficiency virus [HIV] disease: Secondary | ICD-10-CM

## 2020-10-27 MED ORDER — FLUCONAZOLE 100 MG PO TABS: 100.0000 mg | ORAL_TABLET | Freq: Every day | ORAL | 0 refills | Status: DC

## 2020-10-27 NOTE — Assessment & Plan Note (Signed)
Prev labs were fairly well controlled vs prev covid booster today.  rtc in 1 month.   HIV 1 RNA Quant  Date Value  10/10/2020 120 copies/mL  07/20/2020 73 Copies/mL (H)  06/17/2020 152 copies/mL (H)   CD4 T Cell Abs (/uL)  Date Value  10/10/2020 167 (L)  07/20/2020 334 (L)  05/13/2020 <35 (L)

## 2020-10-27 NOTE — Assessment & Plan Note (Signed)
I don't see oral thrush but will give him course of flucon due to voice changes.

## 2020-10-27 NOTE — Assessment & Plan Note (Signed)
Will check his alb He is getting ensure, megace.

## 2020-10-27 NOTE — Progress Notes (Signed)
Subjective:    Patient ID: [name] [name], male  DOB: 1960-02-15, 60 y.o.        MRN: [id number]   HPI 60 yo M with hx of HIV/AIDS, rectal CA, adm to [facility] after being found down. He was found to have group B streptococcal bacteremia. 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 He initially required pressors. He had TEE (-). 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 He had aspiration of his hip while in hospital which did not grow bacteria but did show 31,570 WBC. He was treated with Ceftriaxone IVPB to end on 06-28-20. His ART was restarted in hospital (Prezcobix/edurant/tivicay qday). He was also restarted on bactrim.  SNF is concerned that area around sacral decub is red and irritated at his prev episode. 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 No drainable soft tissue abscess at this time.  -Feathery intramuscular edema within the piriformis muscles and hip adductors bilaterally, likely reactive. He was also found to have wound on his L foot- plain films- no osteo.  He had PIC 9-3 at SNF and anbx started (vanco/zosyn) on 09-12-20.    He has a wound vac on his L foot and a dressing on his sacral wound.  Since starting on anbx he has had mild nausea and "gas". Has also had loose BM (watery). Has been eating well, gained was 131.7 2 days ago. (Up 2.5 #).  (08-14-20) CRP 20.3  He was then adm to [facility] with ARF (felt to be due to vanco) as well as encephalopathy (due to baclofen and metoclopramide). His baclofen dose was reduced, his metaclopramide was stopped, his anbx were changed to augmentin and doxy, end date 10-25-20.   He states he is feeling "alright". He c/o sore throat. Has not noted white lesions in mouth. His sister states he was treated for thrush in hospital. She also states he is still very confused.  He has been having daily, liquid  stool. Has been eating all his meals when his sister is there. Also eating reese cups and drinking sodas.  Has been taken off lomotil in hospital, rx resumed at return to SNF.   Sister asks how we can be sure that infection in sacrum is resolved.  Wound care is being done at SNF, he is not able to go to [facility].  He has air mattress.   Flu vax 10-14-20 at SNF.    HIV 1 RNA Quant  Date Value  10/10/2020 120 copies/mL  07/20/2020 73 Copies/mL (H)  06/17/2020 152 copies/mL (H)   CD4 T Cell Abs (/uL)  Date Value  10/10/2020 167 (L)  07/20/2020 334 (L)  05/13/2020 <35 (L)     Health Maintenance  Topic Date Due  . TETANUS/TDAP  Never done  . Zoster Vaccines- Shingrix (1 of 2) Never done  . COLONOSCOPY (Pts 45-92yrs Insurance coverage will need to be confirmed)  Never done  . COVID-19 Vaccine (4 - Booster for Pfizer series) 02/20/2020  . INFLUENZA VACCINE  08/09/2020  . Pneumococcal Vaccine 75-29 Years old (4 - PPSV23 or PCV20) 01/04/2027  . Hepatitis C Screening  Completed  . HIV Screening  Completed  . HPV VACCINES  Aged Out      Review of Systems  Constitutional:  Positive for weight loss. Negative for chills and fever.  Gastrointestinal:  Positive for diarrhea.  Neurological:  Positive for tremors (improved somewhat).  Psychiatric/Behavioral:  Positive for memory loss.    Please see HPI. All other systems reviewed and negative.     Objective:  Physical Exam Vitals reviewed.  Constitutional:      Appearance: He is ill-appearing.  HENT:     Mouth/Throat:     Mouth: Mucous membranes are dry.     Pharynx: No oropharyngeal exudate.  Cardiovascular:     Rate and Rhythm: Normal rate and regular rhythm.  Abdominal:     General: Abdomen is flat. Bowel sounds are normal.     Palpations: Abdomen is soft.  Musculoskeletal:     Right lower leg: No edema.     Left lower leg: No edema.  Skin:    Comments: Small pressure ulcer wounds on lat feet. No d/c, packed.    Neurological:     Mental Status: He is alert.          Assessment & Plan:

## 2020-10-27 NOTE — Addendum Note (Signed)
Addended by: Caffie Pinto on: 10/27/2020 05:12 PM   Modules accepted: Orders

## 2020-10-27 NOTE — Assessment & Plan Note (Signed)
Will check his CRP Hold on repeat MRI at this point.  He has offloading, wound care, completed anbx. Not sure if he needs diversion.  rtc in 1 month Will call sister with CRP result.

## 2020-10-27 NOTE — Assessment & Plan Note (Signed)
Being seen by dental at [facility]

## 2020-10-27 NOTE — Assessment & Plan Note (Addendum)
Will ask SNF to stop baclofen.  Consider stopping morphine.

## 2020-10-28 ENCOUNTER — Telehealth: Payer: Self-pay | Admitting: Infectious Diseases

## 2020-10-28 LAB — COMPREHENSIVE METABOLIC PANEL
AG Ratio: 1.1 (calc) (ref 1.0–2.5)
ALT: 79 U/L — ABNORMAL HIGH (ref 9–46)
AST: 43 U/L — ABNORMAL HIGH (ref 10–35)
Albumin: 3.7 g/dL (ref 3.6–5.1)
Alkaline phosphatase (APISO): 56 U/L (ref 35–144)
BUN/Creatinine Ratio: 25 (calc) — ABNORMAL HIGH (ref 6–22)
BUN: 61 mg/dL — ABNORMAL HIGH (ref 7–25)
CO2: 15 mmol/L — ABNORMAL LOW (ref 20–32)
Calcium: 10.2 mg/dL (ref 8.6–10.3)
Chloride: 118 mmol/L — ABNORMAL HIGH (ref 98–110)
Creat: 2.48 mg/dL — ABNORMAL HIGH (ref 0.70–1.30)
Globulin: 3.4 g/dL (calc) (ref 1.9–3.7)
Glucose, Bld: 111 mg/dL — ABNORMAL HIGH (ref 65–99)
Potassium: 4.7 mmol/L (ref 3.5–5.3)
Sodium: 150 mmol/L — ABNORMAL HIGH (ref 135–146)
Total Bilirubin: 0.7 mg/dL (ref 0.2–1.2)
Total Protein: 7.1 g/dL (ref 6.1–8.1)

## 2020-10-28 LAB — CBC
HCT: 38.1 % — ABNORMAL LOW (ref 38.5–50.0)
Hemoglobin: 12.4 g/dL — ABNORMAL LOW (ref 13.2–17.1)
MCH: 32.4 pg (ref 27.0–33.0)
MCHC: 32.5 g/dL (ref 32.0–36.0)
MCV: 99.5 fL (ref 80.0–100.0)
MPV: 9.3 fL (ref 7.5–12.5)
Platelets: 199 10*3/uL (ref 140–400)
RBC: 3.83 10*6/uL — ABNORMAL LOW (ref 4.20–5.80)
RDW: 13.9 % (ref 11.0–15.0)
WBC: 13 10*3/uL — ABNORMAL HIGH (ref 3.8–10.8)

## 2020-10-28 LAB — C-REACTIVE PROTEIN: CRP: 21.6 mg/L — ABNORMAL HIGH (ref <8.0)

## 2020-10-28 NOTE — Telephone Encounter (Signed)
Labs faxed with note to facility asking to redraw labs, consider hospital admission and to reach out to schedule follow up.  Patient is at [facility]. Fax: [phone] Phone: [phone]  Carlean Purl, RN

## 2020-10-28 NOTE — Telephone Encounter (Signed)
Called and left VM with information about pt's labs.  He has elevated WBC/CRP/ESR vs prev. Also his Cr has increased.  I am not sure if he is drinking enough +/or his diarrhea is playing a part in this.  I will work on getting him into ID clinic next week to f/u.  I left my cell # in the VM if they have questions.

## 2020-10-29 ENCOUNTER — Telehealth: Payer: Self-pay | Admitting: Infectious Diseases

## 2020-10-29 NOTE — Telephone Encounter (Signed)
Called and spoke with sister. The facility offered to send him to the hospital, they deferred to have him stay at [facility]. He is on IVF at [facility]. He needs to have repeat labs.

## 2020-11-04 ENCOUNTER — Other Ambulatory Visit: Payer: Self-pay

## 2020-11-04 ENCOUNTER — Ambulatory Visit (INDEPENDENT_AMBULATORY_CARE_PROVIDER_SITE_OTHER): Payer: Medicare Other | Admitting: Infectious Diseases

## 2020-11-04 DIAGNOSIS — B370 Candidal stomatitis: Secondary | ICD-10-CM

## 2020-11-04 DIAGNOSIS — K089 Disorder of teeth and supporting structures, unspecified: Secondary | ICD-10-CM

## 2020-11-04 DIAGNOSIS — N179 Acute kidney failure, unspecified: Secondary | ICD-10-CM

## 2020-11-04 DIAGNOSIS — B20 Human immunodeficiency virus [HIV] disease: Secondary | ICD-10-CM

## 2020-11-04 DIAGNOSIS — B3781 Candidal esophagitis: Secondary | ICD-10-CM

## 2020-11-04 DIAGNOSIS — M4628 Osteomyelitis of vertebra, sacral and sacrococcygeal region: Secondary | ICD-10-CM | POA: Diagnosis not present

## 2020-11-04 NOTE — Progress Notes (Signed)
Subjective:    Patient ID: [name] [name], male  DOB: 1960-03-04, 60 y.o.        MRN: [id number]   HPI 60 yo M with hx of HIV/AIDS, rectal CA, [facility] after being found down. He was found to have group B streptococcal bacteremia. 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 He initially required pressors. He had TEE (-). 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 He had aspiration of his hip while in hospital which did not grow bacteria but did show 31,570 WBC. He was treated with Ceftriaxone IVPB to end on 06-28-20. His ART was restarted in hospital (Prezcobix/edurant/tivicay qday). He was also restarted on bactrim.   SNF is concerned that area around sacral decub is red and irritated at his prev episode. 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 No drainable soft tissue abscess at this time.  -Feathery intramuscular edema within the piriformis muscles and hip adductors bilaterally, likely reactive. He was also found to have wound on his L foot- plain films- no osteo.  He had PIC 9-3 at SNF and anbx started (vanco/zosyn) on 09-12-20.    He has a wound vac on his L foot and a dressing on his sacral wound.  Since starting on anbx he has had mild nausea and "gas". Has also had loose BM (watery). Has been eating well, gained was 131.7 2 days ago. (Up 2.5 #).  (08-14-20) CRP 20.3   [facility] with ARF (felt to be due to vanco) as well as encephalopathy (due to baclofen and metoclopramide). His baclofen dose was reduced, his metaclopramide was stopped, his anbx were changed to augmentin and doxy, end date 10-25-20.    At visit 10-24-20: He c/o sore throat. And was given rx for diflucan.   Wound care is being done at SNF, he is not able to go to [facility].  He has air mattress.  He had labs which showed Cr 2.48  CO2 15.  His repeat yesterday  was 1.80. CO2 17. H/H 8.8/26.4. C diff (-).    Flu vax 10-14-20 at SNF.   11-04-20: "I feel ok". "It's the same as last time I was here".  "I feel better now". Is getting IVF at SNF. Is not clear if he has gained wt (face is fuller). He is not clear last time he was weighed.  Feels like the wounds on his feet and sacrum are the same. Having loose BM 3-4x/day.  Per sister, appetite is same. Has termor. Memory is "a little better, still off".  Getting PT in bed only.    HIV 1 RNA Quant  Date Value  10/10/2020 120 copies/mL  07/20/2020 73 Copies/mL (H)  06/17/2020 152 copies/mL (H)   CD4 T Cell Abs (/uL)  Date Value  10/10/2020 167 (L)  07/20/2020 334 (L)  05/13/2020 <35 (L)     Health Maintenance  Topic Date Due  . TETANUS/TDAP  Never done  . Zoster Vaccines- Shingrix (1 of 2) Never done  . COLONOSCOPY (Pts 45-1yrs Insurance coverage will need to be confirmed)  Never done  . INFLUENZA VACCINE  08/09/2020  . Pneumococcal Vaccine 82-64 Years old (4 - PPSV23 if available, else PCV20) 01/04/2027  . COVID-19 Vaccine  Completed  . Hepatitis C Screening  Completed  . HIV Screening  Completed  .  HPV VACCINES  Aged Out      Review of Systems  Gastrointestinal:  Positive for diarrhea.  Genitourinary:  Negative for dysuria.   Please see HPI. All other systems reviewed and negative.     Objective:  Physical Exam Vitals reviewed.  Constitutional:      General: He is not in acute distress.    Appearance: He is ill-appearing. He is not toxic-appearing.  HENT:     Mouth/Throat:     Mouth: Mucous membranes are moist.     Pharynx: Oropharyngeal exudate present.  Eyes:     Extraocular Movements: Extraocular movements intact.     Pupils: Pupils are equal, round, and reactive to light.  Cardiovascular:     Rate and Rhythm: Tachycardia present.  Pulmonary:     Effort: Pulmonary effort is normal.     Breath sounds: Normal breath sounds.  Abdominal:     General: Bowel sounds  are normal. There is no distension.     Palpations: Abdomen is soft.     Tenderness: There is no abdominal tenderness.  Musculoskeletal:       Arms:     Cervical back: Normal range of motion.     Right lower leg: No edema.     Left lower leg: No edema.       Feet:     Comments: Peripheral IV  Feet:     Comments: ~ 0.5cm wound on L foot. No d/c, clean. Non-tender.  ~ 1 cm wound on R foot. No d/c, clean, non-tender.   Both feet are mildly cool.  Neurological:     Mental Status: He is alert.           Assessment & Plan:

## 2020-11-04 NOTE — Assessment & Plan Note (Signed)
Appears to be resolved.  Will stop diflucan.

## 2020-11-04 NOTE — Assessment & Plan Note (Signed)
Currently off anbx Will rely on good wound care of SNF.

## 2020-11-04 NOTE — Assessment & Plan Note (Signed)
Appears to be doing well Will check his labs again in the future (~ 1 month).

## 2020-11-04 NOTE — Assessment & Plan Note (Signed)
Per sister, further dental work on hold to try to avoid exposure to ativan.

## 2020-11-04 NOTE — Assessment & Plan Note (Signed)
Better and back to near baseline. Some contribution of INI/dolutegravir and Cobi.    Question will be how to get him off IVF and independent.

## 2020-11-05 ENCOUNTER — Ambulatory Visit: Payer: Medicare Other | Admitting: Infectious Diseases

## 2020-11-22 DIAGNOSIS — G9340 Encephalopathy, unspecified: Secondary | ICD-10-CM

## 2020-11-25 ENCOUNTER — Ambulatory Visit: Payer: Medicare Other | Admitting: Infectious Diseases

## 2020-11-26 ENCOUNTER — Ambulatory Visit: Payer: Medicare Other | Admitting: Infectious Diseases

## 2020-11-26 ENCOUNTER — Ambulatory Visit: Payer: Medicare Other | Admitting: Internal Medicine

## 2020-12-07 ENCOUNTER — Ambulatory Visit: Payer: Medicare Other | Admitting: Infectious Diseases

## 2020-12-09 ENCOUNTER — Ambulatory Visit (INDEPENDENT_AMBULATORY_CARE_PROVIDER_SITE_OTHER): Payer: Medicare Other | Admitting: Infectious Diseases

## 2020-12-09 ENCOUNTER — Other Ambulatory Visit: Payer: Self-pay

## 2020-12-09 ENCOUNTER — Encounter: Payer: Self-pay | Admitting: Infectious Diseases

## 2020-12-09 ENCOUNTER — Ambulatory Visit: Payer: Medicare Other | Admitting: Infectious Diseases

## 2020-12-09 DIAGNOSIS — A419 Sepsis, unspecified organism: Secondary | ICD-10-CM

## 2020-12-09 DIAGNOSIS — I82403 Acute embolism and thrombosis of unspecified deep veins of lower extremity, bilateral: Secondary | ICD-10-CM

## 2020-12-09 DIAGNOSIS — K567 Ileus, unspecified: Secondary | ICD-10-CM

## 2020-12-09 DIAGNOSIS — L8940 Pressure ulcer of contiguous site of back, buttock and hip, unspecified stage: Secondary | ICD-10-CM

## 2020-12-09 DIAGNOSIS — Z515 Encounter for palliative care: Secondary | ICD-10-CM

## 2020-12-09 DIAGNOSIS — B20 Human immunodeficiency virus [HIV] disease: Secondary | ICD-10-CM

## 2020-12-09 DIAGNOSIS — R6521 Severe sepsis with septic shock: Secondary | ICD-10-CM

## 2020-12-09 NOTE — Assessment & Plan Note (Signed)
Started on eliquis.

## 2020-12-09 NOTE — Assessment & Plan Note (Signed)
They have HCPOA, advance directives.  He just moved to long term care He is seeing palliative care at SNF They do not want hospice (yet).

## 2020-12-09 NOTE — Assessment & Plan Note (Signed)
I re-assured pt, family that he is not septic. Watch for f/c, increase in WBC. Worsening kidney fxn.

## 2020-12-09 NOTE — Assessment & Plan Note (Signed)
Appears to be doing well Taking meds via SNF>

## 2020-12-09 NOTE — Assessment & Plan Note (Signed)
Would consider trying PPI for his dyspepeia Would consider laxative , is diarrhea from overflow? rtc in 6 weeks.

## 2020-12-09 NOTE — Assessment & Plan Note (Signed)
Appreciate SNF f/u, wound care, nutrition.

## 2020-12-09 NOTE — Progress Notes (Signed)
Subjective:    Patient ID: [name] [name], male  DOB: 08-28-1960, 60 y.o.        MRN: [id number]   HPI 60 yo M with hx of HIV/AIDS, rectal CA, adm to [facility] on 5-4 after being found down. He was found to have group B streptococcal bacteremia. 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 He initially required pressors. He had TEE (-). 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 He had aspiration of his hip while in hospital which did not grow bacteria but did show 31,570 WBC. He was treated with Ceftriaxone IVPB to end on 06-28-20. His ART was restarted in hospital (Prezcobix/edurant/tivicay qday). He was also restarted on bactrim.   SNF is concerned that area around sacral decub is red and irritated at his prev episode. 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 No drainable soft tissue abscess at this time.  -Feathery intramuscular edema within the piriformis muscles and hip adductors bilaterally, likely reactive. He was also found to have wound on his L foot- plain films- no osteo.  He had PIC 9-3 at SNF and anbx started (vanco/zosyn) on 09-12-20.    He has a wound vac on his L foot and a dressing on his sacral wound.  Since starting on anbx he has had mild nausea and "gas". Has also had loose BM (watery). Has been eating well, gained was 131.7 2 days ago. (Up 2.5 #).  (08-14-20) CRP 20.3   He was then adm to [facility] on 10-1 with ARF (felt to be due to vanco) as well as encephalopathy (due to baclofen and metoclopramide). His baclofen dose was reduced, his metaclopramide was stopped, his anbx were changed to augmentin and doxy, end date 10-25-20.    At visit 10-24-20: He c/o sore throat. And was given rx for diflucan.   Wound care is being done at SNF, he is not able to go to [facility].  He has air mattress.  He had labs which showed Cr 2.48  CO2 15.  His repeat yesterday  was 1.80. CO2 17. H/H 8.8/26.4. C diff (-).    Flu vax 10-14-20 at SNF.    11-04-20: "I feel ok". "It's the same as last time I was here". He was started on diflucan for thrush.   12-09-20: "I'm getting sick a lot" emesis. Family notes he has burping (? Reflux") a lot. He had an x-ray of his stomach. Was found to have "gas and stool". (Plain film- may represent ileus).  He had arterial studies in his LE and was found to have severe PVD R, mild PVD L, bilateral CFV DVT. He is on eliquis.  Cr 1.7 (11-24-20)  No new meds except zofran.    HIV 1 RNA Quant  Date Value  10/10/2020 120 copies/mL  07/20/2020 73 Copies/mL (H)  06/17/2020 152 copies/mL (H)   CD4 T Cell Abs (/uL)  Date Value  10/10/2020 167 (L)  07/20/2020 334 (L)  05/13/2020 <35 (L)     Health Maintenance  Topic Date Due  . TETANUS/TDAP  Never done  . Zoster Vaccines- Shingrix (1 of 2) Never done  . COLONOSCOPY (Pts 45-17yrs Insurance coverage will need to be confirmed)  Never done  . INFLUENZA VACCINE  08/09/2020  . Pneumococcal Vaccine 20-59 Years old (4 - PPSV23 if available, else PCV20) 01/04/2027  . COVID-19 Vaccine  Completed  .  Hepatitis C Screening  Completed  . HIV Screening  Completed  . HPV VACCINES  Aged Out      Review of Systems  Constitutional:  Negative for chills (I"m cold all the time) and fever.  HENT:  Negative for sore throat.   Gastrointestinal:  Positive for diarrhea, nausea and vomiting.  Psychiatric/Behavioral:         Confusion increased per family   Please see HPI. All other systems reviewed and negative.     Objective:  Physical Exam Vitals reviewed.  Constitutional:      General: He is not in acute distress.    Appearance: He is not ill-appearing or toxic-appearing.  HENT:     Mouth/Throat:     Mouth: Mucous membranes are moist.     Pharynx: No oropharyngeal exudate.  Cardiovascular:     Rate and Rhythm: Regular rhythm. Tachycardia present.     Heart sounds: Murmur  heard.  Pulmonary:     Effort: Pulmonary effort is normal.     Breath sounds: Normal breath sounds.  Abdominal:     General: Bowel sounds are normal.     Palpations: Abdomen is soft.  Musculoskeletal:     Right lower leg: No edema.     Left lower leg: No edema.  Neurological:     Mental Status: He is alert.           Assessment & Plan:

## 2020-12-14 ENCOUNTER — Non-Acute Institutional Stay: Payer: Medicare Other | Admitting: Hospice

## 2020-12-14 ENCOUNTER — Other Ambulatory Visit: Payer: Self-pay

## 2020-12-14 DIAGNOSIS — L89154 Pressure ulcer of sacral region, stage 4: Secondary | ICD-10-CM

## 2020-12-14 DIAGNOSIS — R532 Functional quadriplegia: Secondary | ICD-10-CM

## 2020-12-14 DIAGNOSIS — Z515 Encounter for palliative care: Secondary | ICD-10-CM

## 2020-12-14 NOTE — Progress Notes (Signed)
[facility] Consult Note Telephone: [phone]  Fax: [phone]  PATIENT NAME: [name] [name] [address] [place] [address] [phone] (home)  DOB: 1960-10-16 MRN: [id number]  PRIMARY CARE PROVIDER:    [name], MD,  [place] [address] [phone]  REFERRING PROVIDER:   Martinique Miller NP  RESPONSIBLE PARTY:   [place]     Name Relation Home Work Mobile   [place] Sister [phone]     Fonnie Jarvis [phone]          I met face to face with patient at facility. Palliative Care was asked to follow this patient by consultation request of Martinique Miller NP to address advance care planning, complex medical decision making and goals of care clarification. NP called [name] and left a voicemail with call back number. Patient endorsed palliative service. This is the initial visit.    ASSESSMENT AND / RECOMMENDATIONS:   Advance Care Planning: Our advance care planning conversation included a discussion about:    The value and importance of advance care planning  Difference between Hospice and Palliative care Exploration of goals of care in the event of a sudden injury or illness  Identification and preparation of a healthcare agent  Review and updating or creation of an  advance directive document . Decision not to resuscitate or to de-escalate disease focused treatments due to poor prognosis.  CODE STATUS: Patient affirmed he is a Do Not Resuscitate. Signed DNR form in facility chart; same document uploaded to Epic today.  Goals of Care: Goals include to maximize quality of life and symptom management.  I spent 20 minutes providing this initial consultation. More than 50% of the time in this consultation was spent on counseling patient and coordinating  communication. --------------------------------------------------------------------------------------------------------------------------------------  Symptom Management/Plan: Protein Caloric Malnutrition: Severe with ongoing weight loss. Continue Megace as ordered. Recommendation: Initiate Ensure for protein supplementation twice daily and Mirtazapine 7.5 mg tab p.o at bedtime to help boost appetite. Provide assistance during meals to ensure adequate oral intake/hydration. Nutritionist consult as needed. Routine CBC CMP Functional Quadriplegia: Patient will benefit from OT and restorative exercises.  HIV/AIDS: Managed with Tivicay and Edurant Prezcobix,  Stage 4 Sacral decubitus ulcer: Followed by facility wound nurse. Continue [place] honey sheets and dressing changes as ordered. Air mattress is in place. Encouraged repositioning to alleviate pressure. Follow up: Palliative care will continue to follow for complex medical decision making, advance care planning, and clarification of goals. Return 6 weeks or prn.Encouraged to call provider sooner with any concerns.   Family /Caregiver/Community Supports: Patient in SNF for ongoing care.  HOSPICE ELIGIBILITY/DIAGNOSIS: TBD  Chief Complaint: Initial Palliative care visit  HISTORY OF PRESENT ILLNESS:  [name] [name] is a 60 y.o. year old male  with multiple medical conditions including Protein caloric malnutrition which is worsening with ongoing weight loss, chronic skin breakdown-sacral decubitus ulcer, current BMI is 16.  Patient is a total care.  He currently has an air mattress which he thinks is helpful. Patient reports ongoing weakness; he is a functional quadriplegia.  History of HIV/AIDS, stage IV sacral decubitus ulcer, metabolic encephalopathy.  He denies pain/discomfort.  Nursing with no concerns currently.  Collaborative discussion with Martinique Miller, NP - in agreement patient would become hospice eligible if he continues to decline. History  obtained from review of EMR, discussion with primary team, caregiver, family and/or Mr. Deschler.  Review and summarization of  Epic records shows history from other than patient. Rest of 10 point ROS asked and negative.  I reviewed as needed, available labs, patient records, imaging, studies and related documents from the EMR.   Latest Reference Range & Units 10/27/20 15:43  Sodium 135 - 146 mmol/L 150 (H)  Potassium 3.5 - 5.3 mmol/L 4.7  Chloride 98 - 110 mmol/L 118 (H)  CO2 20 - 32 mmol/L 15 (L)  Glucose 65 - 99 mg/dL 111 (H)  BUN 7 - 25 mg/dL 61 (H)  Creatinine 0.70 - 1.30 mg/dL 2.48 (H)  Calcium 8.6 - 10.3 mg/dL 10.2  BUN/Creatinine Ratio 6 - 22 (calc) 25 (H)  AG Ratio 1.0 - 2.5 (calc) 1.1  AST 10 - 35 U/L 43 (H)  ALT 9 - 46 U/L 79 (H)  Total Protein 6.1 - 8.1 g/dL 7.1    Physical Exam: Height/Weight: 6 feet 2 inches/125 Ibs BMI 16.05 down from 135 Ibs 10/14/2020 Constitutional: NAD General: Well groomed, cooperative EYES: anicteric sclera, lids intact, no discharge  ENMT: Moist mucous membrane CV: S1 S2, RRR, no LE edema Pulmonary: LCTA, no increased work of breathing, no cough, Abdomen: active BS + 4 quadrants, soft and non tender GU: no suprapubic tenderness MSK: weakness, sarcopenia, limited ROM Skin: warm and dry, dressing to sacrum clean dry and intact Neuro:  weakness, otherwise non focal Psych: non-anxious affect Hem/lymph/immuno: no widespread bruising   PAST MEDICAL HISTORY:  Active Ambulatory Problems    Diagnosis Date Noted   Human immunodeficiency virus (HIV) disease ([place]) 10/19/2005   MOLLUSCUM CONTAGIOSUM 10/19/2005   Thrush of mouth and esophagus ([place]) 02/16/2006   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   GOUT 10/19/2005   ANEMIA, CHRONIC 03/20/2007   Acute thromboembolism of deep veins of lower extremity ([place]) 08/22/2006   ABSCESS, RECTUM 04/11/2006   Urethral stricture 09/05/2007   DIARRHEA 02/16/2006   HEPATITIS B, HX OF 10/19/2005   HERPES  ZOSTER, HX OF 10/19/2005   Unspecified severe protein-calorie malnutrition ([place]) 02/14/2007   Enlargement of lymph nodes 01/13/2013   Inguinal adenopathy 02/18/2013   Pain in limb 07/23/2013   Septic arthritis of hip ([place]) 07/31/2013   Decubitus ulcer of sacral region, stage 2 ([place]) 07/31/2013   Protein-calorie malnutrition, severe ([place]) 07/31/2013   Renal failure, chronic 06/10/2014   Poor dentition 01/16/2017   Rectal bleeding 02/20/2020   Abnormal digital rectal exam 02/20/2020   History of anal cancer 02/20/2020   AKI (acute kidney injury) ([place]) 05/13/2020   Lactic acidosis 05/13/2020   SDH (subdural hematoma) 05/13/2020   Hypothermia due to exposure 05/13/2020   Septic shock ([employer]) 05/13/2020   Cellulitis 05/13/2020   Elevated brain natriuretic peptide (BNP) level 05/13/2020   Volume overload 05/13/2020   Dehydration 05/13/2020   Decubitus ulcer 05/13/2020   Subdural empyema    Streptococcal bacteremia    Malnutrition of moderate degree 05/14/2020   Onychomycosis 08/19/2020   Penile ulcer 16/10/9602   Acute metabolic encephalopathy 54/09/8117   Sacral osteomyelitis ([place]) 10/09/2020   Chronic diastolic CHF (congestive heart failure) ([place]) 10/09/2020   Anemia 10/09/2020   Rigidity 10/09/2020   Hypernatremia 10/09/2020   Functional quadriplegia ([place]) 10/09/2020   Pressure injury of skin 10/10/2020   Acute encephalopathy 10/10/2020   Encephalopathy    Palliative care encounter 12/09/2020   Ileus ([place]) 12/09/2020   Resolved Ambulatory Problems    Diagnosis Date Noted   TOBACCO USER 02/16/2006   Dental caries 03/20/2007   Septic shock(785.52) 08/03/2013   S/P right  TH revision 06/15/2014   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   Past Medical History:  Diagnosis Date   Arthritis    Decubitus ulcer of sacral area    DVT (deep venous thrombosis) ([place]) 2008   Exposure to hepatitis B    H/O hypotension    History of anemia    History of gout    History of  transfusion    History of urinary retention    HIV (human immunodeficiency virus infection) ([place])    Hx of sepsis 2015   PONV (postoperative nausea and vomiting)    Radiation    Rectal cancer (H[facility])    Shingles     SOCIAL HX:  Social History   Tobacco Use   Smoking status: Former    Packs/day: 0.30    Types: Cigarettes   Smokeless tobacco: Never  Substance Use Topics   Alcohol use: Yes    Alcohol/week: 3.0 standard drinks    Types: 3 Standard drinks or equivalent per week     FAMILY HX:  Family History  Problem Relation Age of Onset   Diabetes Mother    Dementia Mother    Atrial fibrillation Mother    Hypertension Mother    Cancer Father    Emphysema Father       ALLERGIES:  Allergies  Allergen Reactions   Collagen Rash    redness      PERTINENT MEDICATIONS:  Outpatient Encounter Medications as of 12/14/2020  Medication Sig   acetaminophen (TYLENOL) 325 MG tablet Take 650 mg by mouth every 6 (six) hours as needed for mild pain or fever.   Amino Acids-Protein Hydrolys (FEEDING SUPPLEMENT, PRO-STAT SUGAR FREE 64,) LIQD Take 30 mLs by mouth in the morning and at bedtime.   apixaban (ELIQUIS) 5 MG TABS tablet Take 5 mg by mouth 2 (two) times daily.   baclofen 5 MG TABS Take 5 mg by mouth 2 (two) times daily.   Banana Flakes (BANATROL PLUS) PACK Take 1 packet by mouth in the morning and at bedtime.   cyanocobalamin 100 MCG tablet Take 100 mcg by mouth daily.   darunavir-cobicistat (PREZCOBIX) 800-150 MG tablet TAKE 1 TABLET BY MOUTH DAILY . SWALLOW WHOLE, DO NOT CRUSH, BREAK, OR CHEW TABLETS. TAKE WITH FOOD (Patient taking differently: Take 1 tablet by mouth daily.)   Ensure (ENSURE) Take 237 mLs by mouth in the morning and at bedtime.   fluconazole (DIFLUCAN) 100 MG tablet Take 1 tablet (100 mg total) by mouth daily.   lidocaine HCl 10 MG/ML SOSY injection 4 mLs once. Intramuscular for wound care and debridement   loperamide (IMODIUM A-D) 2 MG tablet Take 2-4 mg by  mouth as needed for diarrhea or loose stools (max dose of 28m/24 hours). 438mafter 1st loose stool and 3m59mfter each subsequent BM   megestrol (MEGACE ES) 625 MG/5ML suspension Take 5 mLs (625 mg total) by mouth daily.   morphine 20 MG/5ML solution Take by mouth every 12 (twelve) hours.   OXYCODONE HCL PO Take 5 mg by mouth every 6 (six) hours as needed.   rilpivirine (EDURANT) 25 MG TABS tablet TAKE 1 TABLET (25 MG TOTAL) BY MOUTH DAILY WITH BREAKFAST   TIVICAY 50 MG tablet TAKE 1 TABLET (50 MG TOTAL) BY MOUTH DAILY (Patient taking differently: Take 50 mg by mouth daily.)   vitamin C (ASCORBIC ACID) 500 MG tablet Take 1,000 mg by mouth daily.   No facility-administered encounter medications on file as of 12/14/2020.  Thank you for the opportunity to participate in the care of Mr. Lukach.  The palliative care team will continue to follow. Please call our office at [phone] if we can be of additional assistance.   Note: Portions of this note were generated with Lobbyist. Dictation errors may occur despite best attempts at proofreading.  Teodoro Spray, NP

## 2020-12-16 ENCOUNTER — Emergency Department (HOSPITAL_COMMUNITY): Payer: Medicare Other

## 2020-12-16 ENCOUNTER — Inpatient Hospital Stay (HOSPITAL_COMMUNITY)
Admission: EM | Admit: 2020-12-16 | Discharge: 2020-12-23 | DRG: 592 | Disposition: A | Discharge status: Skilled Nursing Facility | Payer: Medicare Other | Source: Skilled Nursing Facility | Attending: Internal Medicine | Admitting: Internal Medicine

## 2020-12-16 ENCOUNTER — Encounter (HOSPITAL_COMMUNITY): Payer: Self-pay

## 2020-12-16 DIAGNOSIS — M4628 Osteomyelitis of vertebra, sacral and sacrococcygeal region: Secondary | ICD-10-CM | POA: Diagnosis present

## 2020-12-16 DIAGNOSIS — L97519 Non-pressure chronic ulcer of other part of right foot with unspecified severity: Secondary | ICD-10-CM | POA: Diagnosis present

## 2020-12-16 DIAGNOSIS — R338 Other retention of urine: Secondary | ICD-10-CM

## 2020-12-16 DIAGNOSIS — Z79899 Other long term (current) drug therapy: Secondary | ICD-10-CM

## 2020-12-16 DIAGNOSIS — I739 Peripheral vascular disease, unspecified: Secondary | ICD-10-CM | POA: Diagnosis present

## 2020-12-16 DIAGNOSIS — L89154 Pressure ulcer of sacral region, stage 4: Secondary | ICD-10-CM | POA: Diagnosis not present

## 2020-12-16 DIAGNOSIS — D539 Nutritional anemia, unspecified: Secondary | ICD-10-CM | POA: Diagnosis present

## 2020-12-16 DIAGNOSIS — Z85048 Personal history of other malignant neoplasm of rectum, rectosigmoid junction, and anus: Secondary | ICD-10-CM | POA: Diagnosis present

## 2020-12-16 DIAGNOSIS — N320 Bladder-neck obstruction: Secondary | ICD-10-CM | POA: Diagnosis present

## 2020-12-16 DIAGNOSIS — I5032 Chronic diastolic (congestive) heart failure: Secondary | ICD-10-CM | POA: Diagnosis present

## 2020-12-16 DIAGNOSIS — Z8249 Family history of ischemic heart disease and other diseases of the circulatory system: Secondary | ICD-10-CM

## 2020-12-16 DIAGNOSIS — L89612 Pressure ulcer of right heel, stage 2: Secondary | ICD-10-CM | POA: Diagnosis present

## 2020-12-16 DIAGNOSIS — R532 Functional quadriplegia: Secondary | ICD-10-CM | POA: Diagnosis present

## 2020-12-16 DIAGNOSIS — Z923 Personal history of irradiation: Secondary | ICD-10-CM

## 2020-12-16 DIAGNOSIS — L97529 Non-pressure chronic ulcer of other part of left foot with unspecified severity: Secondary | ICD-10-CM | POA: Diagnosis present

## 2020-12-16 DIAGNOSIS — Z9221 Personal history of antineoplastic chemotherapy: Secondary | ICD-10-CM

## 2020-12-16 DIAGNOSIS — D696 Thrombocytopenia, unspecified: Secondary | ICD-10-CM

## 2020-12-16 DIAGNOSIS — N179 Acute kidney failure, unspecified: Secondary | ICD-10-CM | POA: Diagnosis present

## 2020-12-16 DIAGNOSIS — B20 Human immunodeficiency virus [HIV] disease: Secondary | ICD-10-CM | POA: Diagnosis present

## 2020-12-16 DIAGNOSIS — R627 Adult failure to thrive: Secondary | ICD-10-CM

## 2020-12-16 DIAGNOSIS — G8929 Other chronic pain: Secondary | ICD-10-CM | POA: Diagnosis present

## 2020-12-16 DIAGNOSIS — Z20822 Contact with and (suspected) exposure to covid-19: Secondary | ICD-10-CM | POA: Diagnosis present

## 2020-12-16 DIAGNOSIS — K5900 Constipation, unspecified: Secondary | ICD-10-CM | POA: Diagnosis present

## 2020-12-16 DIAGNOSIS — Z96641 Presence of right artificial hip joint: Secondary | ICD-10-CM | POA: Diagnosis present

## 2020-12-16 DIAGNOSIS — E872 Acidosis, unspecified: Secondary | ICD-10-CM | POA: Diagnosis not present

## 2020-12-16 DIAGNOSIS — Z87891 Personal history of nicotine dependence: Secondary | ICD-10-CM

## 2020-12-16 DIAGNOSIS — D62 Acute posthemorrhagic anemia: Secondary | ICD-10-CM

## 2020-12-16 DIAGNOSIS — K922 Gastrointestinal hemorrhage, unspecified: Secondary | ICD-10-CM

## 2020-12-16 DIAGNOSIS — I82403 Acute embolism and thrombosis of unspecified deep veins of lower extremity, bilateral: Secondary | ICD-10-CM | POA: Diagnosis present

## 2020-12-16 DIAGNOSIS — E538 Deficiency of other specified B group vitamins: Secondary | ICD-10-CM | POA: Diagnosis present

## 2020-12-16 DIAGNOSIS — N1330 Unspecified hydronephrosis: Secondary | ICD-10-CM | POA: Diagnosis present

## 2020-12-16 DIAGNOSIS — Z888 Allergy status to other drugs, medicaments and biological substances status: Secondary | ICD-10-CM

## 2020-12-16 DIAGNOSIS — Z833 Family history of diabetes mellitus: Secondary | ICD-10-CM

## 2020-12-16 DIAGNOSIS — Z7401 Bed confinement status: Secondary | ICD-10-CM

## 2020-12-16 DIAGNOSIS — Z66 Do not resuscitate: Secondary | ICD-10-CM | POA: Diagnosis present

## 2020-12-16 DIAGNOSIS — Z825 Family history of asthma and other chronic lower respiratory diseases: Secondary | ICD-10-CM

## 2020-12-16 DIAGNOSIS — N182 Chronic kidney disease, stage 2 (mild): Secondary | ICD-10-CM | POA: Diagnosis present

## 2020-12-16 DIAGNOSIS — Z7901 Long term (current) use of anticoagulants: Secondary | ICD-10-CM

## 2020-12-16 DIAGNOSIS — I82409 Acute embolism and thrombosis of unspecified deep veins of unspecified lower extremity: Secondary | ICD-10-CM | POA: Diagnosis present

## 2020-12-16 DIAGNOSIS — T148XXA Other injury of unspecified body region, initial encounter: Secondary | ICD-10-CM

## 2020-12-16 DIAGNOSIS — D6832 Hemorrhagic disorder due to extrinsic circulating anticoagulants: Secondary | ICD-10-CM | POA: Diagnosis present

## 2020-12-16 LAB — COMPREHENSIVE METABOLIC PANEL
ALT: 25 U/L (ref 0–44)
AST: 22 U/L (ref 15–41)
Albumin: 2.8 g/dL — ABNORMAL LOW (ref 3.5–5.0)
Alkaline Phosphatase: 36 U/L — ABNORMAL LOW (ref 38–126)
Anion gap: 7 (ref 5–15)
BUN: 80 mg/dL — ABNORMAL HIGH (ref 6–20)
CO2: 20 mmol/L — ABNORMAL LOW (ref 22–32)
Calcium: 8.8 mg/dL — ABNORMAL LOW (ref 8.9–10.3)
Chloride: 112 mmol/L — ABNORMAL HIGH (ref 98–111)
Creatinine, Ser: 2.07 mg/dL — ABNORMAL HIGH (ref 0.61–1.24)
GFR, Estimated: 36 mL/min — ABNORMAL LOW (ref >60)
Glucose, Bld: 136 mg/dL — ABNORMAL HIGH (ref 70–99)
Potassium: 4.8 mmol/L (ref 3.5–5.1)
Sodium: 139 mmol/L (ref 135–145)
Total Bilirubin: 0.4 mg/dL (ref 0.3–1.2)
Total Protein: 5.9 g/dL — ABNORMAL LOW (ref 6.5–8.1)

## 2020-12-16 LAB — CBC WITH DIFFERENTIAL/PLATELET
Abs Immature Granulocytes: 0.55 10*3/uL — ABNORMAL HIGH (ref 0.00–0.07)
Basophils Absolute: 0 10*3/uL (ref 0.0–0.1)
Basophils Relative: 0 %
Eosinophils Absolute: 0 10*3/uL (ref 0.0–0.5)
Eosinophils Relative: 0 %
HCT: 26.3 % — ABNORMAL LOW (ref 39.0–52.0)
Hemoglobin: 8.2 g/dL — ABNORMAL LOW (ref 13.0–17.0)
Immature Granulocytes: 6 %
Lymphocytes Relative: 16 %
Lymphs Abs: 1.6 10*3/uL (ref 0.7–4.0)
MCH: 34.5 pg — ABNORMAL HIGH (ref 26.0–34.0)
MCHC: 31.2 g/dL (ref 30.0–36.0)
MCV: 110.5 fL — ABNORMAL HIGH (ref 80.0–100.0)
Monocytes Absolute: 0.5 10*3/uL (ref 0.1–1.0)
Monocytes Relative: 6 %
Neutro Abs: 7.2 10*3/uL (ref 1.7–7.7)
Neutrophils Relative %: 72 %
Platelets: 226 10*3/uL (ref 150–400)
RBC: 2.38 MIL/uL — ABNORMAL LOW (ref 4.22–5.81)
RDW: 17.5 % — ABNORMAL HIGH (ref 11.5–15.5)
WBC: 9.9 10*3/uL (ref 4.0–10.5)
nRBC: 1.7 % — ABNORMAL HIGH (ref 0.0–0.2)

## 2020-12-16 LAB — I-STAT CHEM 8, ED
BUN: 84 mg/dL — ABNORMAL HIGH (ref 6–20)
Calcium, Ion: 1.21 mmol/L (ref 1.15–1.40)
Chloride: 112 mmol/L — ABNORMAL HIGH (ref 98–111)
Creatinine, Ser: 2.3 mg/dL — ABNORMAL HIGH (ref 0.61–1.24)
Glucose, Bld: 127 mg/dL — ABNORMAL HIGH (ref 70–99)
HCT: 23 % — ABNORMAL LOW (ref 39.0–52.0)
Hemoglobin: 7.8 g/dL — ABNORMAL LOW (ref 13.0–17.0)
Potassium: 4.4 mmol/L (ref 3.5–5.1)
Sodium: 138 mmol/L (ref 135–145)
TCO2: 18 mmol/L — ABNORMAL LOW (ref 22–32)

## 2020-12-16 LAB — LACTIC ACID, PLASMA
Lactic Acid, Venous: 2.4 mmol/L (ref 0.5–1.9)
Lactic Acid, Venous: 2.4 mmol/L (ref 0.5–1.9)

## 2020-12-16 LAB — OCCULT BLOOD X 1 CARD TO LAB, STOOL: Fecal Occult Bld: POSITIVE — AB

## 2020-12-16 LAB — HEMOGLOBIN AND HEMATOCRIT, BLOOD
HCT: 25.2 % — ABNORMAL LOW (ref 39.0–52.0)
Hemoglobin: 7.7 g/dL — ABNORMAL LOW (ref 13.0–17.0)

## 2020-12-16 MED ORDER — PANTOPRAZOLE SODIUM 40 MG IV SOLR
40.0000 mg | Freq: Once | INTRAVENOUS | Status: AC
  Administered 2020-12-16: 40 mg via INTRAVENOUS
  Filled 2020-12-16: qty 40

## 2020-12-16 MED ORDER — SODIUM CHLORIDE 0.9 % IV BOLUS
1000.0000 mL | Freq: Once | INTRAVENOUS | Status: AC
  Administered 2020-12-16: 1000 mL via INTRAVENOUS

## 2020-12-16 NOTE — ED Provider Notes (Addendum)
[facility] DEPT Provider Note   CSN: [id number] Arrival date & time: 12/16/20  1534     History No chief complaint on file.   [name] [name] is a 60 y.o. male.  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  He reports during this timeframe he has had 4 episodes.  He reports he has intermittently been on Eliquis most recently he was started back in the summer 2022 and has been compliant with this since.  He describes this as bright red blood per rectum.  He denies constipation, diarrhea.  He does have mild abdominal pain which is new.  He denies fever, chills, lightheadedness, chest pain, shortness of breath, nausea, or vomiting.  He denies melanotic stools, hematemesis, hemoptysis.  Patient reports taking ibuprofen 40 mg twice daily for about a month for headache and generalized pains.  The history is provided by the patient. No language interpreter was used.      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    HIV (human immunodeficiency virus infection) (HCC)    Hx of sepsis 2015   affected rt hip / femur   Pain in limb 07/23/2013   PONV (postoperative nausea and vomiting)    Hiccups for 4 days after anesthesia   Radiation    Rectal cancer (HCC)    Squamous cell   Shingles    left shoulder, right leg    Patient Active Problem List   Diagnosis Date Noted   Palliative care encounter 12/09/2020   Ileus ([place]) 12/09/2020   Encephalopathy    Pressure injury of skin 10/10/2020   Acute encephalopathy 30/86/5784   Acute metabolic  encephalopathy 69/62/9528   Sacral osteomyelitis ([place]) 10/09/2020   Chronic diastolic CHF (congestive heart failure) ([place]) 10/09/2020   Anemia 10/09/2020   Rigidity 10/09/2020   Hypernatremia 10/09/2020   Functional quadriplegia ([place]) 10/09/2020   Onychomycosis 08/19/2020   Penile ulcer 08/19/2020   Malnutrition of moderate degree 05/14/2020   AKI (acute kidney injury) ([place]) 05/13/2020   Lactic acidosis 05/13/2020   SDH (subdural hematoma) 05/13/2020   Hypothermia due to exposure 05/13/2020   Septic shock ([place]) 05/13/2020   Cellulitis 05/13/2020   Elevated brain natriuretic peptide (BNP) level 05/13/2020   Volume overload 05/13/2020   Dehydration 05/13/2020   Decubitus ulcer 05/13/2020   Subdural empyema    Streptococcal bacteremia    Rectal bleeding 02/20/2020   Abnormal digital rectal exam 02/20/2020   History of anal cancer 02/20/2020   Poor dentition 01/16/2017   Renal failure, chronic 06/10/2014   Septic arthritis of hip ([place]) 07/31/2013   Decubitus ulcer of sacral region, stage 2 ([place]) 07/31/2013   Protein-calorie malnutrition, severe ([place]) 07/31/2013   Pain in limb 07/23/2013   Inguinal adenopathy 02/18/2013   Enlargement of lymph nodes 01/13/2013   Urethral stricture 09/05/2007   ANEMIA, CHRONIC 03/20/2007   Unspecified severe protein-calorie malnutrition ([place]) 02/14/2007   Acute thromboembolism of deep veins of lower extremity ([place]) 08/22/2006  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   ABSCESS, RECTUM 04/11/2006   Thrush of mouth and esophagus ([place]) 02/16/2006   DIARRHEA 02/16/2006   Human immunodeficiency virus (HIV) disease ([place]) 10/19/2005   MOLLUSCUM CONTAGIOSUM 10/19/2005   GOUT 10/19/2005   HEPATITIS B, HX OF 10/19/2005   HERPES ZOSTER, HX OF 10/19/2005    Past Surgical History:  Procedure Laterality Date   COLONOSCOPY     CONVERSION TO TOTAL HIP Right 06/15/2014   Procedure: REMOVAL OF ANTIBIOTIC  SPACER AND CONVERSION TO RIGHT TOTAL HIP   ARTHROPLASTY ;  Surgeon: Paralee Cancel, MD;  Location: [facility];  Service: Orthopedics;  Laterality: Right;   CYSTOSCOPY     HEMATOMA EVACUATION     HERNIA REPAIR     right X2   INCISION AND DRAINAGE HIP Right 07/31/2013   Procedure: IRRIGATION AND DEBRIDEMENT HIP WITH PERCUTANEOUS DRAIN PLACEMENT;  Surgeon: [name], MD;  Location: [facility];  Service: Orthopedics;  Laterality: Right;   INCISION AND DRAINAGE HIP Right 03/15/2015   Procedure: IRRIGATION AND DEBRIDEMENT RIGHT HIP WITH  HEAD BALL  AND POLY LINER EXCHANGE;  Surgeon: Paralee Cancel, MD;  Location: [facility];  Service: Orthopedics;  Laterality: Right;   INCISION AND DRAINAGE OF WOUND N/A 08/25/2013   Procedure: IRRIGATION AND DEBRIDEMENT OF SACRAL ULCER WITH PLACEMENT OF A CELL ;  Surgeon: Theodoro Kos, DO;  Location: [facility];  Service: Plastics;  Laterality: N/A;   INCISION AND DRAINAGE OF WOUND N/A 09/01/2013   Procedure: IRRIGATION AND DEBRIDEMENT SACRAL ULCER WITH PLACEMENT OF A CELL;  Surgeon: Theodoro Kos, DO;  Location: [facility];  Service: Plastics;  Laterality: N/A;   INCISION AND DRAINAGE OF WOUND N/A 09/08/2013   Procedure: IRRIGATION AND DEBRIDEMENT OF SACRAL ULCER WITH PLACEMENT OF A CELL AND VAC;  Surgeon: Theodoro Kos, DO;  Location: [facility];  Service: Plastics;  Laterality: N/A;   INCISION AND DRAINAGE OF WOUND N/A 09/18/2013   Procedure: IRRIGATION AND DEBRIDEMENT OF SACRAL WOUND WITH PLACEMENT OF A-CELL;  Surgeon: Theodoro Kos, DO;  Location: [facility];  Service: Plastics;  Laterality: N/A;   PILONIDAL CYST DRAINAGE N/A 08/09/2013   Procedure: IRRIGATION AND DEBRIDEMENT SACRAL DECUBITUS;  Surgeon: Harl Bowie, MD;  Location: [facility];  Service: General;  Laterality: N/A;   RECTAL BIOPSY N/A 03/29/2020   Procedure: EXCISION OF PERIANAL TISSUE WITH BX AND 3 PUNCH BXS;  Surgeon: Ileana Roup, MD;  Location: [facility];  Service: General;  Laterality: N/A;   TOTAL HIP ARTHROPLASTY Right 08/04/2013   Procedure: RESECTION HIP JOINT -  PLACEMENT OF CEMENT PROSTHESIS;  Surgeon: [name], MD;  Location: [facility];  Service: Orthopedics;  Laterality: Right;   TRANSURETHRAL RESECTION OF PROSTATE  JAN 2016       Family History  Problem Relation Age of Onset   Diabetes Mother    Dementia Mother    Atrial fibrillation Mother    Hypertension Mother    Cancer Father    Emphysema Father     Social History   Tobacco Use   Smoking status: Former    Packs/day: 0.30    Types: Cigarettes   Smokeless tobacco: Never  Vaping Use   Vaping Use: Never used  Substance Use Topics   Alcohol use: Yes    Alcohol/week: 3.0 standard drinks    Types: 3 Standard drinks or equivalent per week   Drug use: Yes    Types: Marijuana    Comment: occ.    Home  Medications Prior to Admission medications   Medication Sig Start Date End Date Taking? Authorizing Provider  acetaminophen (TYLENOL) 325 MG tablet Take 650 mg by mouth every 6 (six) hours as needed for mild pain or fever.    [provider]  Amino Acids-Protein Hydrolys (FEEDING SUPPLEMENT, PRO-STAT SUGAR FREE 64,) LIQD Take 30 mLs by mouth in the morning and at bedtime.    [provider]  apixaban (ELIQUIS) 5 MG TABS tablet Take 5 mg by mouth 2 (two) times daily.    [provider]  baclofen 5 MG TABS Take 5 mg by mouth 2 (two) times daily. 10/14/20   Arrien, Jimmy Picket, MD  Banana Flakes (BANATROL PLUS) PACK Take 1 packet by mouth in the morning and at bedtime.    [provider]  cyanocobalamin 100 MCG tablet Take 100 mcg by mouth daily.    [provider]  darunavir-cobicistat (PREZCOBIX) 800-150 MG tablet TAKE 1 TABLET BY MOUTH DAILY . SWALLOW WHOLE, DO NOT CRUSH, BREAK, OR CHEW TABLETS. TAKE WITH FOOD Patient taking differently: Take 1 tablet by mouth daily. 08/19/20   [name], MD  Ensure (ENSURE) Take 237 mLs by mouth in the morning and at bedtime.    [provider]  fluconazole (DIFLUCAN) 100 MG tablet  Take 1 tablet (100 mg total) by mouth daily. 10/27/20   [name], MD  lidocaine HCl 10 MG/ML SOSY injection 4 mLs once. Intramuscular for wound care and debridement    [provider]  loperamide (IMODIUM A-D) 2 MG tablet Take 2-4 mg by mouth as needed for diarrhea or loose stools (max dose of 16mg /24 hours). 4mg  after 1st loose stool and 2mg  after each subsequent BM    [provider]  megestrol (MEGACE ES) 625 MG/5ML suspension Take 5 mLs (625 mg total) by mouth daily. 07/20/20   [name], MD  morphine 20 MG/5ML solution Take by mouth every 12 (twelve) hours.    [provider]  OXYCODONE HCL PO Take 5 mg by mouth every 6 (six) hours as needed.    [provider]  rilpivirine (EDURANT) 25 MG TABS tablet TAKE 1 TABLET (25 MG TOTAL) BY MOUTH DAILY WITH BREAKFAST 08/19/20   [name], MD  TIVICAY 50 MG tablet TAKE 1 TABLET (50 MG TOTAL) BY MOUTH DAILY Patient taking differently: Take 50 mg by mouth daily. 08/19/20   [name], MD  vitamin C (ASCORBIC ACID) 500 MG tablet Take 1,000 mg by mouth daily.    [provider]    Allergies    Collagen  Review of Systems   Review of Systems  Constitutional:  Negative for activity change, chills and fever.  Respiratory:  Negative for shortness of breath.   Cardiovascular:  Negative for chest pain and palpitations.  Gastrointestinal:  Positive for abdominal pain, anal bleeding and blood in stool. Negative for constipation, diarrhea, nausea and vomiting.  Neurological:  Negative for light-headedness.  All other systems reviewed and are negative.  Physical Exam Updated Vital Signs BP 123/81 (BP Location: Right Arm)   Pulse 90   Temp 98.3 F (36.8 C) (Oral)   Resp 18   SpO2 100%   Physical Exam Vitals and nursing note reviewed.  Constitutional:      General: He is not in acute distress.    Appearance: Normal appearance. He is not ill-appearing.  HENT:     Head:  Normocephalic and atraumatic.     Nose: Nose  normal.  Eyes:     General: No scleral icterus.    Extraocular Movements: Extraocular movements intact.     Conjunctiva/sclera: Conjunctivae normal.  Cardiovascular:     Rate and Rhythm: Normal rate and regular rhythm.     Pulses: Normal pulses.     Heart sounds: Normal heart sounds.  Pulmonary:     Effort: Pulmonary effort is normal. No respiratory distress.     Breath sounds: Normal breath sounds. No wheezing or rales.  Abdominal:     General: There is no distension.     Tenderness: There is abdominal tenderness (diffuse). There is no guarding.  Musculoskeletal:        General: Normal range of motion.     Cervical back: Normal range of motion.     Right lower leg: No edema.     Left lower leg: No edema.  Skin:    General: Skin is warm and dry.  Neurological:     General: No focal deficit present.     Mental Status: He is alert. Mental status is at baseline.     ED Results / Procedures / Treatments   Labs (all labs ordered are listed, but only abnormal results are displayed) Labs Reviewed  CBC WITH DIFFERENTIAL/PLATELET - Abnormal; Notable for the following components:      Result Value   RBC 2.38 (*)    Hemoglobin 8.2 (*)    HCT 26.3 (*)    MCV 110.5 (*)    MCH 34.5 (*)    RDW 17.5 (*)    nRBC 1.7 (*)    Abs Immature Granulocytes 0.55 (*)    All other components within normal limits  COMPREHENSIVE METABOLIC PANEL - Abnormal; Notable for the following components:   Chloride 112 (*)    CO2 20 (*)    Glucose, Bld 136 (*)    BUN 80 (*)    Creatinine, Ser 2.07 (*)    Calcium 8.8 (*)    Total Protein 5.9 (*)    Albumin 2.8 (*)    Alkaline Phosphatase 36 (*)    GFR, Estimated 36 (*)    All other components within normal limits  I-STAT CHEM 8, ED - Abnormal; Notable for the following components:   Chloride 112 (*)    BUN 84 (*)    Creatinine, Ser 2.30 (*)    Glucose, Bld 127 (*)    TCO2 18 (*)    Hemoglobin 7.8 (*)     HCT 23.0 (*)    All other components within normal limits  OCCULT BLOOD X 1 CARD TO LAB, STOOL  TYPE AND SCREEN    EKG None  Radiology No results found.  Procedures Procedures   Medications Ordered in ED Medications  sodium chloride 0.9 % bolus 1,000 mL (has no administration in time range)    ED Course  I have reviewed the triage vital signs and the nursing notes.  Pertinent labs & imaging results that were available during my care of the patient were reviewed by me and considered in my medical decision making (see chart for details).    MDM Rules/Calculators/A&P                           60 year old male presents today for evaluation of rectal bleeding x2 days.  He reports during this timeframe he has had about 5-6 episodes.  His hemoglobin today is 8.2 compared to 12.4 on October 19.  Prior to  that he has had anemia with hemoglobin around 9-10.  He is hemodynamically stable.  We will give patient fluid resuscitation get CT scan for new abdominal pain and discussed case with [facility].   GI recommends serial H&H, fluid resuscitation, n.p.o., Protonix 40 mg IV push twice daily and they will evaluate patient in the morning unless change in clinical status overnight.   Patient remains hemodynamically stable.  We will repeat H&H.  CT abdomen pelvis with distended bladder otherwise without acute intra-abdominal process.  Will discuss with hospitalist for potential admission.  Hospitalist will evaluate for admission.  Final Clinical Impression(s) / ED Diagnoses Final diagnoses:  Gastrointestinal hemorrhage, unspecified gastrointestinal hemorrhage type  Lactic acidosis    Rx / DC Orders ED Discharge Orders     None        Evlyn Courier, PA-C 12/16/20 2311    Evlyn Courier, PA-C 12/16/20 2312    Davonna Belling, MD 12/17/20 1450

## 2020-12-16 NOTE — ED Triage Notes (Signed)
Pt arrived via EMS, from [facility], rectal bleeding x2 days. Recently put on eliquis.

## 2020-12-16 NOTE — ED Notes (Signed)
Pt had a bm RN and I changed  pt. Pt had a wound that was actively bleeding.

## 2020-12-16 NOTE — ED Provider Notes (Signed)
MSE note.  Patient complains of rectal bleeding for the last 2 days.  He has bled 4 times in the last 2 days.  Physical exam patient alert orient x4 no acute distress abdomen nontender.  Labs ordered   Milton Ferguson, MD 12/16/20 1606

## 2020-12-17 ENCOUNTER — Inpatient Hospital Stay (HOSPITAL_COMMUNITY): Payer: Medicare Other | Admitting: Certified Registered"

## 2020-12-17 ENCOUNTER — Encounter (HOSPITAL_COMMUNITY)
Admission: EM | Disposition: A | Discharge status: Skilled Nursing Facility | Payer: Self-pay | Source: Skilled Nursing Facility | Attending: Family Medicine

## 2020-12-17 ENCOUNTER — Encounter (HOSPITAL_COMMUNITY): Payer: Self-pay | Admitting: Internal Medicine

## 2020-12-17 DIAGNOSIS — N179 Acute kidney failure, unspecified: Secondary | ICD-10-CM

## 2020-12-17 DIAGNOSIS — E872 Acidosis, unspecified: Secondary | ICD-10-CM | POA: Diagnosis present

## 2020-12-17 DIAGNOSIS — Z96641 Presence of right artificial hip joint: Secondary | ICD-10-CM | POA: Diagnosis present

## 2020-12-17 DIAGNOSIS — Z7189 Other specified counseling: Secondary | ICD-10-CM | POA: Diagnosis not present

## 2020-12-17 DIAGNOSIS — I82403 Acute embolism and thrombosis of unspecified deep veins of lower extremity, bilateral: Secondary | ICD-10-CM | POA: Diagnosis present

## 2020-12-17 DIAGNOSIS — D539 Nutritional anemia, unspecified: Secondary | ICD-10-CM | POA: Diagnosis present

## 2020-12-17 DIAGNOSIS — Z9221 Personal history of antineoplastic chemotherapy: Secondary | ICD-10-CM | POA: Diagnosis not present

## 2020-12-17 DIAGNOSIS — N320 Bladder-neck obstruction: Secondary | ICD-10-CM | POA: Diagnosis present

## 2020-12-17 DIAGNOSIS — D6832 Hemorrhagic disorder due to extrinsic circulating anticoagulants: Secondary | ICD-10-CM | POA: Diagnosis present

## 2020-12-17 DIAGNOSIS — B20 Human immunodeficiency virus [HIV] disease: Secondary | ICD-10-CM | POA: Diagnosis present

## 2020-12-17 DIAGNOSIS — Z85048 Personal history of other malignant neoplasm of rectum, rectosigmoid junction, and anus: Secondary | ICD-10-CM | POA: Diagnosis not present

## 2020-12-17 DIAGNOSIS — D696 Thrombocytopenia, unspecified: Secondary | ICD-10-CM | POA: Diagnosis present

## 2020-12-17 DIAGNOSIS — K626 Ulcer of anus and rectum: Secondary | ICD-10-CM | POA: Diagnosis not present

## 2020-12-17 DIAGNOSIS — R627 Adult failure to thrive: Secondary | ICD-10-CM | POA: Diagnosis present

## 2020-12-17 DIAGNOSIS — Z66 Do not resuscitate: Secondary | ICD-10-CM | POA: Diagnosis present

## 2020-12-17 DIAGNOSIS — L89154 Pressure ulcer of sacral region, stage 4: Secondary | ICD-10-CM | POA: Diagnosis present

## 2020-12-17 DIAGNOSIS — N1330 Unspecified hydronephrosis: Secondary | ICD-10-CM | POA: Diagnosis present

## 2020-12-17 DIAGNOSIS — Z79899 Other long term (current) drug therapy: Secondary | ICD-10-CM | POA: Diagnosis not present

## 2020-12-17 DIAGNOSIS — T148XXA Other injury of unspecified body region, initial encounter: Secondary | ICD-10-CM | POA: Diagnosis not present

## 2020-12-17 DIAGNOSIS — K922 Gastrointestinal hemorrhage, unspecified: Secondary | ICD-10-CM

## 2020-12-17 DIAGNOSIS — D62 Acute posthemorrhagic anemia: Secondary | ICD-10-CM | POA: Diagnosis not present

## 2020-12-17 DIAGNOSIS — M4628 Osteomyelitis of vertebra, sacral and sacrococcygeal region: Secondary | ICD-10-CM

## 2020-12-17 DIAGNOSIS — K625 Hemorrhage of anus and rectum: Secondary | ICD-10-CM | POA: Diagnosis not present

## 2020-12-17 DIAGNOSIS — R532 Functional quadriplegia: Secondary | ICD-10-CM

## 2020-12-17 DIAGNOSIS — Z923 Personal history of irradiation: Secondary | ICD-10-CM | POA: Diagnosis not present

## 2020-12-17 DIAGNOSIS — I5032 Chronic diastolic (congestive) heart failure: Secondary | ICD-10-CM | POA: Diagnosis present

## 2020-12-17 DIAGNOSIS — I739 Peripheral vascular disease, unspecified: Secondary | ICD-10-CM | POA: Diagnosis present

## 2020-12-17 DIAGNOSIS — Z20822 Contact with and (suspected) exposure to covid-19: Secondary | ICD-10-CM | POA: Diagnosis present

## 2020-12-17 DIAGNOSIS — R338 Other retention of urine: Secondary | ICD-10-CM | POA: Diagnosis not present

## 2020-12-17 HISTORY — PX: FLEXIBLE SIGMOIDOSCOPY: SHX5431

## 2020-12-17 LAB — CBC
HCT: 24.4 % — ABNORMAL LOW (ref 39.0–52.0)
HCT: 25.7 % — ABNORMAL LOW (ref 39.0–52.0)
Hemoglobin: 7.4 g/dL — ABNORMAL LOW (ref 13.0–17.0)
Hemoglobin: 7.9 g/dL — ABNORMAL LOW (ref 13.0–17.0)
MCH: 34.3 pg — ABNORMAL HIGH (ref 26.0–34.0)
MCH: 34.2 pg — ABNORMAL HIGH (ref 26.0–34.0)
MCHC: 30.3 g/dL (ref 30.0–36.0)
MCHC: 30.7 g/dL (ref 30.0–36.0)
MCV: 113 fL — ABNORMAL HIGH (ref 80.0–100.0)
MCV: 111.3 fL — ABNORMAL HIGH (ref 80.0–100.0)
Platelets: 225 10*3/uL (ref 150–400)
Platelets: 228 10*3/uL (ref 150–400)
RBC: 2.16 MIL/uL — ABNORMAL LOW (ref 4.22–5.81)
RBC: 2.31 MIL/uL — ABNORMAL LOW (ref 4.22–5.81)
RDW: 17.4 % — ABNORMAL HIGH (ref 11.5–15.5)
RDW: 18 % — ABNORMAL HIGH (ref 11.5–15.5)
WBC: 12 10*3/uL — ABNORMAL HIGH (ref 4.0–10.5)
WBC: 11.9 10*3/uL — ABNORMAL HIGH (ref 4.0–10.5)
nRBC: 1.8 % — ABNORMAL HIGH (ref 0.0–0.2)
nRBC: 1.6 % — ABNORMAL HIGH (ref 0.0–0.2)

## 2020-12-17 LAB — BASIC METABOLIC PANEL
Anion gap: 8 (ref 5–15)
Anion gap: 8 (ref 5–15)
BUN: 71 mg/dL — ABNORMAL HIGH (ref 6–20)
BUN: 63 mg/dL — ABNORMAL HIGH (ref 6–20)
CO2: 16 mmol/L — ABNORMAL LOW (ref 22–32)
CO2: 18 mmol/L — ABNORMAL LOW (ref 22–32)
Calcium: 8.3 mg/dL — ABNORMAL LOW (ref 8.9–10.3)
Calcium: 8.8 mg/dL — ABNORMAL LOW (ref 8.9–10.3)
Chloride: 112 mmol/L — ABNORMAL HIGH (ref 98–111)
Chloride: 114 mmol/L — ABNORMAL HIGH (ref 98–111)
Creatinine, Ser: 1.89 mg/dL — ABNORMAL HIGH (ref 0.61–1.24)
Creatinine, Ser: 1.78 mg/dL — ABNORMAL HIGH (ref 0.61–1.24)
GFR, Estimated: 41 mL/min — ABNORMAL LOW (ref >60)
GFR, Estimated: 44 mL/min — ABNORMAL LOW (ref >60)
Glucose, Bld: 105 mg/dL — ABNORMAL HIGH (ref 70–99)
Glucose, Bld: 88 mg/dL (ref 70–99)
Potassium: 3.7 mmol/L (ref 3.5–5.1)
Potassium: 4.3 mmol/L (ref 3.5–5.1)
Sodium: 136 mmol/L (ref 135–145)
Sodium: 140 mmol/L (ref 135–145)

## 2020-12-17 LAB — RESP PANEL BY RT-PCR (FLU A&B, COVID) ARPGX2
Influenza A by PCR: NEGATIVE
Influenza B by PCR: NEGATIVE
SARS Coronavirus 2 by RT PCR: NEGATIVE

## 2020-12-17 LAB — LACTIC ACID, PLASMA
Lactic Acid, Venous: 2.5 mmol/L (ref 0.5–1.9)
Lactic Acid, Venous: 1.8 mmol/L (ref 0.5–1.9)

## 2020-12-17 SURGERY — SIGMOIDOSCOPY, FLEXIBLE
Anesthesia: Monitor Anesthesia Care

## 2020-12-17 MED ORDER — DOLUTEGRAVIR SODIUM 50 MG PO TABS
50.0000 mg | ORAL_TABLET | Freq: Every day | ORAL | Status: DC
  Administered 2020-12-17 – 2020-12-23 (×7): 50 mg via ORAL
  Filled 2020-12-17 (×7): qty 1

## 2020-12-17 MED ORDER — ACETAMINOPHEN 650 MG RE SUPP: 650.0000 mg | Freq: Four times a day (QID) | RECTAL | Status: DC | PRN

## 2020-12-17 MED ORDER — DARUNAVIR-COBICISTAT 800-150 MG PO TABS
1.0000 | ORAL_TABLET | Freq: Every day | ORAL | Status: DC
  Administered 2020-12-17 – 2020-12-23 (×7): 1 via ORAL
  Filled 2020-12-17 (×7): qty 1

## 2020-12-17 MED ORDER — CALCIUM POLYCARBOPHIL 625 MG PO TABS
625.0000 mg | ORAL_TABLET | Freq: Every day | ORAL | Status: DC
  Administered 2020-12-18 – 2020-12-23 (×6): 625 mg via ORAL
  Filled 2020-12-17 (×7): qty 1

## 2020-12-17 MED ORDER — PROPOFOL 10 MG/ML IV BOLUS
INTRAVENOUS | Status: DC | PRN
  Administered 2020-12-17: 40 mg via INTRAVENOUS
  Administered 2020-12-17: 30 mg via INTRAVENOUS

## 2020-12-17 MED ORDER — PANTOPRAZOLE SODIUM 40 MG IV SOLR
40.0000 mg | Freq: Two times a day (BID) | INTRAVENOUS | Status: DC
  Administered 2020-12-17: 40 mg via INTRAVENOUS
  Filled 2020-12-17: qty 40

## 2020-12-17 MED ORDER — ACETAMINOPHEN 325 MG PO TABS
650.0000 mg | ORAL_TABLET | Freq: Four times a day (QID) | ORAL | Status: DC | PRN
  Administered 2020-12-19 – 2020-12-20 (×3): 650 mg via ORAL
  Filled 2020-12-17 (×3): qty 2

## 2020-12-17 MED ORDER — LACTATED RINGERS IV SOLN: INTRAVENOUS | Status: AC

## 2020-12-17 MED ORDER — CHLORHEXIDINE GLUCONATE CLOTH 2 % EX PADS
6.0000 | MEDICATED_PAD | Freq: Every day | CUTANEOUS | Status: DC
  Administered 2020-12-17 – 2020-12-22 (×6): 6 via TOPICAL

## 2020-12-17 MED ORDER — ONDANSETRON HCL 4 MG PO TABS: 4.0000 mg | ORAL_TABLET | Freq: Four times a day (QID) | ORAL | Status: DC | PRN

## 2020-12-17 MED ORDER — RILPIVIRINE HCL 25 MG PO TABS
25.0000 mg | ORAL_TABLET | Freq: Every day | ORAL | Status: DC
  Administered 2020-12-17 – 2020-12-23 (×7): 25 mg via ORAL
  Filled 2020-12-17 (×9): qty 1

## 2020-12-17 MED ORDER — LIDOCAINE 2% (20 MG/ML) 5 ML SYRINGE
INTRAMUSCULAR | Status: DC | PRN
  Administered 2020-12-17: 40 mg via INTRAVENOUS

## 2020-12-17 MED ORDER — ONDANSETRON HCL 4 MG/2ML IJ SOLN
4.0000 mg | Freq: Four times a day (QID) | INTRAMUSCULAR | Status: DC | PRN
  Administered 2020-12-19: 4 mg via INTRAVENOUS
  Filled 2020-12-17: qty 2

## 2020-12-17 MED ORDER — FAMOTIDINE 20 MG PO TABS
20.0000 mg | ORAL_TABLET | Freq: Every day | ORAL | Status: DC
  Administered 2020-12-17 – 2020-12-22 (×6): 20 mg via ORAL
  Filled 2020-12-17 (×6): qty 1

## 2020-12-17 MED ORDER — ONDANSETRON HCL 4 MG/2ML IJ SOLN
INTRAMUSCULAR | Status: DC | PRN
  Administered 2020-12-17: 4 mg via INTRAVENOUS

## 2020-12-17 MED ORDER — PANTOPRAZOLE SODIUM 40 MG PO TBEC: 40.0000 mg | DELAYED_RELEASE_TABLET | Freq: Every day | ORAL | Status: DC

## 2020-12-17 MED ORDER — OXYCODONE HCL 5 MG PO TABS
5.0000 mg | ORAL_TABLET | Freq: Four times a day (QID) | ORAL | Status: DC | PRN
  Administered 2020-12-17 – 2020-12-22 (×9): 5 mg via ORAL
  Filled 2020-12-17 (×9): qty 1

## 2020-12-17 MED ORDER — HYDROMORPHONE HCL 1 MG/ML IJ SOLN
0.5000 mg | INTRAMUSCULAR | Status: DC | PRN
Start: 2020-12-17 — End: 2020-12-24
  Administered 2020-12-17 – 2020-12-23 (×8): 1 mg via INTRAVENOUS
  Filled 2020-12-17 (×8): qty 1

## 2020-12-17 MED ORDER — LACTATED RINGERS IV SOLN
INTRAVENOUS | Status: DC
Start: 2020-12-17 — End: 2020-12-17

## 2020-12-17 MED ORDER — PROPOFOL 500 MG/50ML IV EMUL
INTRAVENOUS | Status: DC | PRN
  Administered 2020-12-17: 120 ug/kg/min via INTRAVENOUS

## 2020-12-17 MED ORDER — VITAMIN B-12 100 MCG PO TABS
100.0000 ug | ORAL_TABLET | Freq: Every day | ORAL | Status: DC
Start: 2020-12-17 — End: 2020-12-24
  Administered 2020-12-17 – 2020-12-23 (×7): 100 ug via ORAL
  Filled 2020-12-17 (×7): qty 1

## 2020-12-17 NOTE — ED Notes (Signed)
This nurse verified with Hospitalist, Dr. Algis Liming, pt medications to be given with small sips of water. This nurse verified Burtis Junes, RN., that pt is also able to receive PO meds prior to colonoscopy. Per Burtis Junes, RN., pt to endo at 12:15pm today.

## 2020-12-17 NOTE — H&P (Addendum)
History and Physical    [name] [name] CNO:[id number] DOB: 1960-12-13 DOA: 12/16/2020  PCP: [name], MD  Patient coming from: SNF  I have personally briefly reviewed patient's old medical records in [place]  Chief Complaint: Rectal bleeding  HPI: [name] [name] is a 60 y.o. male with medical history significant of HIV on ART, prior DVTs on eliquis, rectal CA reported to be in remission, functional quadriplegia.  Pt admitted to [facility] on 05/12/20 after being found down, had group B strep bacteremia, multiple subacute infarcts on head CT, initially on pressors.  TEE neg at that time.  Has h/o R THA, previously on suppressive ABx.  Aspiration of hip during hospital stay didn't grow bacteria but did have high WBC.  Put on rocephin.  Sacral decubitus with osteomyelitis of sacrum treated with ABx in Aug 2022.  B foot wounds from PAD.  In to [facility] in Oct with AKI from [place] and AMS from baclofen.  Arterial studies in [place] showed severe PVD R, mild PVD L, bilateral CFV DVTs (on eliquis).  Not sure when these were done as I dont see these in system (getting this info from Dr. Algis Downs office note from 12/09/20).  At this point pt is in SNF, has functional quadriplegia, is being followed by [facility] care, is DNR though not yet on hospice.  Pt presents to the ED for evaluation of rectal bleeding for past 2 days.  4 episodes of BRBPR.  Has been on eliquis since summer 2022.    No constipation, diarrhea, fevers, chills, CP, SOB, N/V, melena.  Has been on ibuprofen BID for past month.   ED Course: HGB 7.7 down slightly from his ~8 baseline.  Creat 2.0 today and BUN 84 is up from baseline.   Review of Systems: As per HPI, otherwise all review of systems negative.  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    HIV (human immunodeficiency virus infection) ([place])    Hx of sepsis 2015   affected rt hip / femur   Pain in limb 07/23/2013   PONV (postoperative nausea and vomiting)    Hiccups for 4 days after anesthesia   Radiation    Rectal cancer (HCC)    Squamous cell   Shingles    left shoulder, right leg    Past Surgical History:  Procedure Laterality Date   COLONOSCOPY     CONVERSION TO TOTAL HIP Right 06/15/2014   Procedure: REMOVAL OF ANTIBIOTIC  SPACER AND CONVERSION TO RIGHT TOTAL HIP  ARTHROPLASTY ;  Surgeon: Paralee Cancel, MD;  Location: [facility];  Service: Orthopedics;  Laterality: Right;   CYSTOSCOPY     HEMATOMA EVACUATION     HERNIA REPAIR     right X2   INCISION AND DRAINAGE HIP Right 07/31/2013   Procedure: IRRIGATION AND DEBRIDEMENT HIP WITH PERCUTANEOUS DRAIN PLACEMENT;  Surgeon: [name], MD;  Location: [place];  Service: Orthopedics;  Laterality: Right;   INCISION AND DRAINAGE HIP Right 03/15/2015   Procedure: IRRIGATION AND DEBRIDEMENT RIGHT HIP WITH  HEAD BALL  AND POLY LINER EXCHANGE;  Surgeon: Rodman Key  [name], MD;  Location: [facility];  Service: Orthopedics;  Laterality: Right;   INCISION AND DRAINAGE OF WOUND N/A 08/25/2013   Procedure: IRRIGATION AND DEBRIDEMENT OF SACRAL ULCER WITH PLACEMENT OF A CELL ;  Surgeon: Theodoro Kos, DO;  Location: [place];  Service: Plastics;  Laterality: N/A;   INCISION AND DRAINAGE OF WOUND N/A 09/01/2013   Procedure: IRRIGATION AND DEBRIDEMENT SACRAL ULCER WITH PLACEMENT OF A CELL;  Surgeon: Theodoro Kos, DO;  Location: [place];  Service: Plastics;  Laterality: N/A;   INCISION AND DRAINAGE OF WOUND N/A 09/08/2013   Procedure: IRRIGATION AND DEBRIDEMENT OF SACRAL ULCER WITH PLACEMENT OF A CELL AND VAC;  Surgeon: Theodoro Kos, DO;  Location: [place];  Service: Plastics;  Laterality: N/A;   INCISION AND DRAINAGE OF WOUND N/A 09/18/2013   Procedure: IRRIGATION AND  DEBRIDEMENT OF SACRAL WOUND WITH PLACEMENT OF A-CELL;  Surgeon: Theodoro Kos, DO;  Location: [place];  Service: Plastics;  Laterality: N/A;   PILONIDAL CYST DRAINAGE N/A 08/09/2013   Procedure: IRRIGATION AND DEBRIDEMENT SACRAL DECUBITUS;  Surgeon: Harl Bowie, MD;  Location: [place];  Service: General;  Laterality: N/A;   RECTAL BIOPSY N/A 03/29/2020   Procedure: EXCISION OF PERIANAL TISSUE WITH BX AND 3 PUNCH BXS;  Surgeon: Ileana Roup, MD;  [facility];  Service: General;  Laterality: N/A;   TOTAL HIP ARTHROPLASTY Right 08/04/2013   Procedure: RESECTION HIP JOINT - PLACEMENT OF CEMENT PROSTHESIS;  Surgeon: [name], MD;  Location: [place];  Service: Orthopedics;  Laterality: Right;   TRANSURETHRAL RESECTION OF PROSTATE  JAN 2016     reports that he has quit smoking. His smoking use included cigarettes. He smoked an average of .3 packs per day. He has never used smokeless tobacco. He reports current alcohol use of about 3.0 standard drinks per week. He reports current drug use. Drug: Marijuana.  Allergies  Allergen Reactions   Collagen Rash    redness    Family History  Problem Relation Age of Onset   Diabetes Mother    Dementia Mother    Atrial fibrillation Mother    Hypertension Mother    Cancer Father    Emphysema Father      Prior to Admission medications   Medication Sig Start Date End Date Taking? Authorizing Provider  cyanocobalamin 100 MCG tablet Take 100 mcg by mouth daily.   Yes [provider]  megestrol (MEGACE ES) 625 MG/5ML suspension Take 5 mLs (625 mg total) by mouth daily. 07/20/20  Yes [name], MD  polycarbophil (FIBERCON) 625 MG tablet Take 625 mg by mouth daily.   Yes [provider]  rilpivirine (EDURANT) 25 MG TABS tablet TAKE 1 TABLET (25 MG TOTAL) BY MOUTH DAILY WITH BREAKFAST Patient taking differently: Take 25 mg by mouth daily with breakfast. 08/19/20  Yes [name], MD  vitamin C (ASCORBIC ACID)  500 MG tablet Take 1,000 mg by mouth daily.   Yes [provider]  acetaminophen (TYLENOL) 325 MG tablet Take 650 mg by mouth every 6 (six) hours as needed for mild pain or fever.    [provider]  Amino Acids-Protein Hydrolys (FEEDING SUPPLEMENT, PRO-STAT SUGAR FREE 64,) LIQD Take 30 mLs by mouth in the morning and at bedtime.    [provider]  apixaban (ELIQUIS) 5 MG TABS tablet Take 5 mg by mouth 2 (two) times daily.    [provider]  baclofen 5 MG TABS Take 5 mg by  mouth 2 (two) times daily. 10/14/20   Arrien, Jimmy Picket, MD  Banana Flakes (BANATROL PLUS) PACK Take 1 packet by mouth in the morning and at bedtime.    [provider]  darunavir-cobicistat (PREZCOBIX) 800-150 MG tablet TAKE 1 TABLET BY MOUTH DAILY . SWALLOW WHOLE, DO NOT CRUSH, BREAK, OR CHEW TABLETS. TAKE WITH FOOD Patient taking differently: Take 1 tablet by mouth daily. 08/19/20   [name], MD  Ensure (ENSURE) Take 237 mLs by mouth in the morning and at bedtime.    [provider]  fluconazole (DIFLUCAN) 100 MG tablet Take 1 tablet (100 mg total) by mouth daily. 10/27/20   [name], MD  lidocaine HCl 10 MG/ML SOSY injection 4 mLs once. Intramuscular for wound care and debridement    [provider]  loperamide (IMODIUM A-D) 2 MG tablet Take 2-4 mg by mouth as needed for diarrhea or loose stools (max dose of 16mg /24 hours). 4mg  after 1st loose stool and 2mg  after each subsequent BM    [provider]  morphine 20 MG/5ML solution Take by mouth every 12 (twelve) hours.    [provider]  OXYCODONE HCL PO Take 5 mg by mouth every 6 (six) hours as needed.    [provider]  TIVICAY 50 MG tablet TAKE 1 TABLET (50 MG TOTAL) BY MOUTH DAILY Patient taking differently: Take 50 mg by mouth daily. 08/19/20   [name], MD    Physical Exam: Vitals:   12/16/20 2315 12/16/20 2330 12/16/20 2345 12/17/20 0000  BP:  118/82 125/82 123/83 102/82  Pulse: 75 79 73 92  Resp:      Temp:      TempSrc:      SpO2: 100% 100% 100% 100%    Constitutional: Ill appearing Eyes: PERRL, lids and conjunctivae normal ENMT: Mucous membranes are moist. Posterior pharynx clear of any exudate or lesions.Normal dentition.  Neck: normal, supple, no masses, no thyromegaly Respiratory: clear to auscultation bilaterally, no wheezing, no crackles. Normal respiratory effort. No accessory muscle use.  Cardiovascular: Regular rate and rhythm, no murmurs / rubs / gallops. No extremity edema. 2+ pedal pulses. No carotid bruits.  Abdomen: diffuse TTP Musculoskeletal: no clubbing / cyanosis. No joint deformity upper and lower extremities. Good ROM, no contractures. Normal muscle tone.  Skin:  Neurologic: Generalized weakness Psychiatric: Normal judgment and insight. Alert and oriented x 3. Normal mood.    Labs on Admission: I have personally reviewed following labs and imaging studies  CBC: Recent Labs  Lab 12/16/20 1644 12/16/20 1654 12/16/20 2250  WBC 9.9  --   --   NEUTROABS 7.2  --   --   HGB 8.2* 7.8* 7.7*  HCT 26.3* 23.0* 25.2*  MCV 110.5*  --   --   PLT 226  --   --    Basic Metabolic Panel: Recent Labs  Lab 12/16/20 1644 12/16/20 1654  NA 139 138  K 4.8 4.4  CL 112* 112*  CO2 20*  --   GLUCOSE 136* 127*  BUN 80* 84*  CREATININE 2.07* 2.30*  CALCIUM 8.8*  --    GFR: CrCl cannot be calculated (Unknown ideal weight.). Liver Function Tests: Recent Labs  Lab 12/16/20 1644  AST 22  ALT 25  ALKPHOS 36*  BILITOT 0.4  PROT 5.9*  ALBUMIN 2.8*   No results for input(s): LIPASE, AMYLASE in the last 168 hours. No results for input(s): AMMONIA in the last 168 hours. Coagulation Profile: No  results for input(s): INR, PROTIME in the last 168 hours. Cardiac Enzymes: No results for input(s): CKTOTAL, CKMB, CKMBINDEX, TROPONINI in the last 168 hours. BNP (last 3 results) No results for input(s): PROBNP in  the last 8760 hours. HbA1C: No results for input(s): HGBA1C in the last 72 hours. CBG: No results for input(s): GLUCAP in the last 168 hours. Lipid Profile: No results for input(s): CHOL, HDL, LDLCALC, TRIG, CHOLHDL, LDLDIRECT in the last 72 hours. Thyroid Function Tests: No results for input(s): TSH, T4TOTAL, FREET4, T3FREE, THYROIDAB in the last 72 hours. Anemia Panel: No results for input(s): VITAMINB12, FOLATE, FERRITIN, TIBC, IRON, RETICCTPCT in the last 72 hours. Urine analysis:    Component Value Date/Time   COLORURINE YELLOW 10/09/2020 1800   APPEARANCEUR CLEAR 10/09/2020 1800   LABSPEC 1.016 10/09/2020 1800   LABSPEC 1.030 04/11/2007 1446   PHURINE 5.0 10/09/2020 1800   GLUCOSEU NEGATIVE 10/09/2020 1800   HGBUR NEGATIVE 10/09/2020 1800   BILIRUBINUR NEGATIVE 10/09/2020 1800   BILIRUBINUR Negative 04/11/2007 1446   KETONESUR NEGATIVE 10/09/2020 1800   PROTEINUR NEGATIVE 10/09/2020 1800   UROBILINOGEN 0.2 06/10/2014 1303   NITRITE NEGATIVE 10/09/2020 1800   LEUKOCYTESUR NEGATIVE 10/09/2020 1800   LEUKOCYTESUR Negative 04/11/2007 1446    Radiological Exams on Admission: CT ABDOMEN PELVIS WO CONTRAST  Result Date: 12/16/2020 CLINICAL DATA:  Acute abdominal pain.  Rectal bleeding. EXAM: CT ABDOMEN AND PELVIS WITHOUT CONTRAST TECHNIQUE: Multidetector CT imaging of the abdomen and pelvis was performed following the standard protocol without IV contrast. COMPARISON:  CT abdomen and pelvis 05/12/2020. FINDINGS: Lower chest: No acute abnormality. Hepatobiliary: No focal liver abnormality is seen. No gallstones, gallbladder wall thickening, or biliary dilatation. Pancreas: Unremarkable. No pancreatic ductal dilatation or surrounding inflammatory changes. Spleen: Normal in size without focal abnormality. Adrenals/Urinary Tract: The bladder is dilated. There is moderate bilateral hydroureteronephrosis to the level of the bladder without obstructing calculus. Otherwise, the kidneys  appear within normal limits. The adrenal glands are within normal limits. Stomach/Bowel: There is a small hiatal hernia. Stomach is otherwise within normal limits. Appendix appears normal. No evidence of bowel wall thickening, distention, or inflammatory changes. There is a large amount of stool throughout the colon. Vascular/Lymphatic: Aortic atherosclerosis. No enlarged abdominal or pelvic lymph nodes. Reproductive: Prostate is unremarkable. Other: There is a small fat containing left inguinal hernia. There is no ascites or free air. Musculoskeletal: Right hip arthroplasty is present. Bones are osteopenic. There has been progression of soft tissue defect overlying the posterior sacrum. There is new sacral sclerosis thinning this level with new presacral edema. Findings are concerning for osteomyelitis. IMPRESSION: 1. Bladder is dilated. Moderate bilateral hydroureteronephrosis without obstructing calculi. Findings are concerning for bladder outlet obstruction. Recommend clinical correlation and follow-up. 2. Progression of soft tissue defect overlying the sacrum with new sacral thinning and sclerosis and new presacral edema. Findings are worrisome for osteomyelitis. Electronically Signed   By: Ronney Asters M.D.   On: 12/16/2020 22:20    EKG: Independently reviewed.  Assessment/Plan Principal Problem:   GIB (gastrointestinal bleeding) Active Problems:   Human immunodeficiency virus (HIV) disease (HCC)   Acute thromboembolism of deep veins of lower extremity (HCC)   History of anal cancer   AKI (acute kidney injury) ([place])   Sacral osteomyelitis (HCC)   Functional quadriplegia (HCC)    GIB with BRBPR - HGB of 7.7 today is down from his 8-9 baseline.  Did have 1 lab of 12.4 in Oct but that seems to be an outlier. EDP  spoke with [facility] Start PPI IV BID in case there is occult UGIB (with BUN elevation). Serial H/H NPO IVF Holding eliquis Repeat CBC in AM Then H/H Q6H Transfuse if  needed AKI - Likely secondary to #1 above IVF Strict intake and output Repeat BMP in AM HIV - cont HAART Sacral osteomyelitis - S/p treatment with ABx CT scan noted to be concerning for sacral osteomyelitis findings; however, comparison films were from May this year (before he was diagnosed with sacral osteomyelitis late during the summer).  Not clear if todays findings are actually new sacral osteomyelitis findings, or just the known treated sacral osteomyelitis he had over the summer. No fever, no WBC May want to touch base with ID regarding this in the morning. Functional quadriplegia - Patient has functional quadriplegia.  Despite not having a cord injury, this patient is bedridden and requires total care and assistance with mobility, feeding, grooming and other ADLs.  B CFV DVTs - Was on eliquis up until today, holding eliquis due to acute GIB Unclear how acute these are (when the [place] was performed), as I dont see the [place] in our system.  Just taking this information off of Dr. Algis Downs 12/09/20 office note (doesn't seem to be mentioned in the Oct or prior office notes, so more recent?) SNF med rec looks like he just started the eliquis on 12/08/20 for DVTs  DVT prophylaxis: None at the moment, not clear how long since the BLE ultrasound demonstrate B CFV DVTs.  Holding eliquis in setting of acute GIB. Code Status: Full Family Communication: No family in room Disposition Plan: SNF after GIB workup Consults called: EDP spoke with [facility] Admission status: Place in 60   Quinterius Gaida, [place] [facility]  How to contact the [facility] Attending or Consulting provider [place] or covering provider during after hours [place], for this patient?  Check the care team in [facility] and look for a) attending/consulting [facility] provider listed and b) the [facility] team listed Log into [email or web address]  Amion Physician Scheduling and messaging for groups and whole hospitals  On call and physician scheduling  software for group practices, residents, hospitalists and other medical providers for call, clinic, rotation and shift schedules. On[employer] is a hospital-wide system for scheduling doctors and paging doctors on call. EasyPlot is for scientific plotting and data analysis.  [email or web address]  and use [place]'s universal password to access. If you do not have the password, please contact the hospital operator.  Locate the [facility] provider you are looking for under [facility] and page to a number that you can be directly reached. If you still have difficulty reaching the provider, please page the [facility] (Director on Call) for the Hospitalists listed on amion for assistance.  12/17/2020, 1:18 AM

## 2020-12-17 NOTE — Progress Notes (Addendum)
PROGRESS NOTE   [name] [name]  GGE:[id number]    DOB: 08-Dec-1960    DOA: 12/16/2020  PCP: [name], MD   I have briefly reviewed patients previous medical records in [facility].  Chief complaint: Rectal bleeding   Brief Narrative:  60 year old male, resident of SNF, medical history significant for HIV on ART, prior DVTs on Eliquis, anal CA s/p concurrent chemotherapy and radiation-reportedly in remission, functional quadriplegia, admitted to [facility] 05/12/2020 after being found down, had group B strep bacteremia, multiple subacute infarcts on head CT, initially on pressors and TEE negative at that time.  History of right THA, previously on suppressive antibiotics.  Sacral decubitus with osteomyelitis of sacrum.  Bilateral foot wounds from PAD.  In [facility] in October with AKI from vancomycin and AMS from baclofen.  It is unclear when patient had his DVTs as the studies were not in [facility] (info from Dr. Algis Downs office note from 12/09/2020).  Patient followed by palliative care at SNF, is DNR but not hospice.  Presented to the ED with history of rectal bleeding x2 days, 4 episodes of BRBPR.  Reportedly on Eliquis since summer 2022.  Admitted for rectal bleeding, acute on chronic blood loss anemia, acute kidney injury complicating chronic kidney disease, acute urinary retention requiring Foley catheter placement.  [facility] consulted and s/p flexible sigmoidoscopy.   Assessment & Plan:  Principal Problem:   GIB (gastrointestinal bleeding) Active Problems:   Human immunodeficiency virus (HIV) disease (HCC)   Acute thromboembolism of deep veins of lower extremity (HCC)   History of anal cancer   AKI (acute kidney injury) ([place])   Sacral osteomyelitis ([place])   Functional quadriplegia (HCC)   Rectal bleeding/BRBPR -As per chart review, seen at outpatient [place] office on 04/07/2020 for rectal bleeding after being referred by [name].  On rectal exam, noted nodularity and ulcerations of  the perianal area.  DD at that time: Recurrence of malignancy versus radiation changes versus viral infection such as CMV or HSV in the setting of known HIV.  Colonoscopy was deferred and he was referred to colorectal surgery.  -As per oncology office note from 04/05/2020: He underwent an exam under anesthesia 03/29/2020 (detailed findings in their office note).  Biopsies were negative for malignancy.  No obvious evidence of cancer recurrence and may represent radiation toxicity.   -Held Eliquis, IVF, IV PPI twice daily -Hemoglobin checked and stable -[facility] input appreciated.  S/p flexible sigmoidoscopy (detailed report as below).  As per communication with Dr. Arelia Longest, he thinks that this bleeding is anal and perianal bleeding?  From osteomyelitis and no possible interventions from him.  He is okay resuming diet.  He recommends continuing to hold anticoagulation and consideration for?  Hospice.   - I communicated with infectious disease MD on-call, no indication for retreatment for his osteomyelitis at this time.  Acute blood loss anemia complicating chronic anemia-likely of chronic disease and B12 deficiency -Baseline hemoglobin probably in the 7 to 8 g range. -Presented with hemoglobin of 8.2 which had drifted down to 7.4 which is likely multifactorial due to rectal bleeding, bleeding from large sacral decubitus and hemodilution from IVF. - Follow CBC closely and transfuse for hemoglobin 7 or less.  Patient agreeable. -Anemia panel in October 2022 showed B12 of 111.  Has macrocytosis.  We will repeat B12 and if low, replace IM as needed. -Repeated hemoglobin and up to 7.9.  Follow CBC in AM.  Acute kidney injury complicating stage II chronic  kidney disease -Did have a creatinine of 1.25 on 10/13/2020.  However since then he has had creatinines in the 2 range- -Presented with creatinine of 2.07.  Likely prerenal and due to bladder outlet obstruction noted on CT.  Has improved to 1.78 post  hydration. -Continue IV fluids and follow BMP daily. -Consider doing a renal ultrasound in a few days to ensure resolution of bilateral hydroureteronephrosis seen on CT felt to be due to bladder outlet obstruction  Non-anion gap metabolic acidosis -Unclear etiology but has had this in the past as well. -Bicarbonate is improved from 16 this morning to 88.  Follow BMP in a.m.  Acute urinary retention -Noted on CT abdomen. -Foley catheter placed in ED and reportedly had 700 mL immediate urine output followed by 700 later. -Start Flomax when able and outpatient follow-up with urology.  Could consider voiding trial prior to discharge.  HIV -Continue HAART.  Alerted ID of patient's admission.  Large sacral decubitus ulcer with osteomyelitis -S/p treatment with antibiotics. -CT scan noted to be concerning for sacral osteomyelitis findings.  Unsure if these are new or chronic findings. -No fever or leukocytosis.  No antibiotics initiated. -As per communication with ID, no indication for retreatment of his osteomyelitis at this time. - [facility] RN consultation appreciated-follow recommendations.  Air overlay mattress.  Registered dietitian consulted for evaluation of nutritional status  Functional quadriplegia -Patient reports that he was ambulating with a walker until a week ago.  Has not walked since then. -PT and OT evaluation.  Bilateral lower extremity DVTs -As per H&P, unclear how acute these are and when the ultrasound was performed since these studies are not in [facility].  This information was obtained from Dr. Algis Downs office note 12/09/2020. -Per SNF med rec, looks like he started Eliquis on 12/08/2020 for DVTs.  However appears that he was following with Dr. Benay Spice for DVT in the past. -Holding Eliquis at this time and not sure if we will be able to safely resume.  We will have to determine whether this is a long-term medicine or should have a stop date.  Bilateral feet ulcers,  chronic -Wound care RN consulted  Adult failure to thrive -Multifactorial.  Consulted palliative care team to address goals of care.  There is no height or weight on file to calculate BMI.  Nutritional Status        Pressure Ulcer:     DVT prophylaxis:   None.  Anticoagulation held due to rectal bleeding and due to concern for bilateral lower extremity DVTs, no SCDs ordered either.   Code Status: DNR Family Communication: None at bedside. Disposition:  Status is: Inpatient.  The patient will require care spanning > 2 midnights and should be moved to inpatient because: Severity of illness, rectal bleeding, acute blood loss anemia that requires further evaluation and management, may require blood transfusions and EGD.       Consultants:   [facility].  Procedures:   Flexible sigmoidoscopy 12/9:  Impression:  - Blood clots, skin breakdown and induration found on perianal exam. - Decreased tone with indurated and open canal found on digital rectal exam. Ulcerated and distorted anal and perianal area with bleeding. ? from osteomyelitis or other cause like pressure injury. Nothing amenable to endoscopic treatment. I do not think there is more proximal bleeding source. - No specimens collected.  Recommendations:  - Return patient to hospital ward for ongoing care. - continue to hold anticoagulation - needs ID input re: osteomyelitis - ? surgical help -  I did not order diet but ok to eat from my perspective - ? hospice  Antimicrobials:   None.   Subjective:  Seen this morning while still in the ED and prior to procedure.  Reported that he had 5 episodes of nonbloody emesis about 5 days ago but none since.  Decreased appetite.  Some hiccups.  Rectal bleeding x2 days.  As per RN in ED, had some bleeding but unable to clearly distinguish if bleeding from large sacral decubitus ulcer or rectal bleeding.  Denies dizziness, lightheadedness, weakness, chest pain or  dyspnea  Objective:   Vitals:   12/17/20 1436 12/17/20 1500 12/17/20 1503 12/17/20 1527  BP: 105/78 112/69 106/72   Pulse: 80 74 79 77  Resp: 18 17 14 17   Temp:      TempSrc:      SpO2: 100% 100% 100% 100%    General exam: Young male, moderately built and poorly nourished, cachectic, chronically ill looking, bilateral temporal wasting, lying comfortably propped up in bed without distress.  Oral mucosa with borderline hydration. Respiratory system: Clear to auscultation. Respiratory effort normal. Cardiovascular system: S1 & S2 heard, RRR. No JVD, murmurs, rubs, gallops or clicks. No pedal edema. Gastrointestinal system: Abdomen is nondistended, soft and nontender. No organomegaly or masses felt. Normal bowel sounds heard.  Lower midline laparotomy scar. GU: Foley catheter in place. Central nervous system: Alert and oriented. No focal neurological deficits. Extremities: Wasting of all 4 extremities but more so in the distal upper extremities and in the lower extremities.  At least grade 4 x 5 power in upper extremities and at least 2 x 5 power in bilateral lower extremities. Skin: Large sacral decubitus ulcer as seen in picture below, appears clean with some bleeding. Psychiatry: Judgement and insight appear normal. Mood & affect appropriate.   Picture from 12/16/2020     Data Reviewed:   I have personally reviewed following labs and imaging studies   CBC: Recent Labs  Lab 12/16/20 1644 12/16/20 1654 12/16/20 2250 12/17/20 0605 12/17/20 1200  WBC 9.9  --   --  12.0* 11.9*  NEUTROABS 7.2  --   --   --   --   HGB 8.2*   < > 7.7* 7.4* 7.9*  HCT 26.3*   < > 25.2* 24.4* 25.7*  MCV 110.5*  --   --  113.0* 111.3*  PLT 226  --   --  225 228   < > = values in this interval not displayed.    Basic Metabolic Panel: Recent Labs  Lab 12/16/20 1644 12/16/20 1654 12/17/20 0605 12/17/20 1200  NA 139 138 136 140  K 4.8 4.4 3.7 4.3  CL 112* 112* 112* 114*  CO2 20*  --  16* 18*   GLUCOSE 136* 127* 105* 88  BUN 80* 84* 71* 63*  CREATININE 2.07* 2.30* 1.89* 1.78*  CALCIUM 8.8*  --  8.3* 8.8*    Liver Function Tests: Recent Labs  Lab 12/16/20 1644  AST 22  ALT 25  ALKPHOS 36*  BILITOT 0.4  PROT 5.9*  ALBUMIN 2.8*    CBG: No results for input(s): GLUCAP in the last 168 hours.  Microbiology Studies:   Recent Results (from the past 240 hour(s))  Resp Panel by RT-PCR (Flu A&B, Covid) Nasopharyngeal Swab     Status: None   Collection Time: 12/17/20 12:17 AM   Specimen: Nasopharyngeal Swab; Nasopharyngeal(NP) swabs in vial transport medium  Result Value Ref Range Status   SARS Coronavirus 2  by RT PCR NEGATIVE NEGATIVE Final    Comment: (NOTE) SARS-CoV-2 target nucleic acids are NOT DETECTED.  The SARS-CoV-2 RNA is generally detectable in upper respiratory specimens during the acute phase of infection. The lowest concentration of SARS-CoV-2 viral copies this assay can detect is 138 copies/mL. A negative result does not preclude SARS-Cov-2 infection and should not be used as the sole basis for treatment or other patient management decisions. A negative result may occur with  improper specimen collection/handling, submission of specimen other than nasopharyngeal swab, presence of viral mutation(s) within the areas targeted by this assay, and inadequate number of viral copies(<138 copies/mL). A negative result must be combined with clinical observations, patient history, and epidemiological information. The expected result is Negative.  Fact Sheet for Patients:  EntrepreneurPulse.com.au  Fact Sheet for Healthcare Providers:  IncredibleEmployment.be  This test is no t yet approved or cleared by the [place] FDA and  has been authorized for detection and/or diagnosis of SARS-CoV-2 by FDA under an Emergency Use Authorization (EUA). This EUA will remain  in effect (meaning this test can be used) for the duration  of the COVID-19 declaration under Section 564(b)(1) of the Act, 21 U.S.C.section 360bbb-3(b)(1), unless the authorization is terminated  or revoked sooner.       Influenza A by PCR NEGATIVE NEGATIVE Final   Influenza B by PCR NEGATIVE NEGATIVE Final    Comment: (NOTE) The Xpert Xpress SARS-CoV-2/FLU/RSV plus assay is intended as an aid in the diagnosis of influenza from Nasopharyngeal swab specimens and should not be used as a sole basis for treatment. Nasal washings and aspirates are unacceptable for Xpert Xpress SARS-CoV-2/FLU/RSV testing.  Fact Sheet for Patients: EntrepreneurPulse.com.au  Fact Sheet for Healthcare Providers: IncredibleEmployment.be  This test is not yet approved or cleared by the [place] FDA and has been authorized for detection and/or diagnosis of SARS-CoV-2 by FDA under an Emergency Use Authorization (EUA). This EUA will remain in effect (meaning this test can be used) for the duration of the COVID-19 declaration under Section 564(b)(1) of the Act, 21 U.S.C. section 360bbb-3(b)(1), unless the authorization is terminated or revoked.  Performed at [facility], [place] [address] [place], [place] [address]     Radiology Studies:  CT ABDOMEN PELVIS WO CONTRAST  Result Date: 12/16/2020 CLINICAL DATA:  Acute abdominal pain.  Rectal bleeding. EXAM: CT ABDOMEN AND PELVIS WITHOUT CONTRAST TECHNIQUE: Multidetector CT imaging of the abdomen and pelvis was performed following the standard protocol without IV contrast. COMPARISON:  CT abdomen and pelvis 05/12/2020. FINDINGS: Lower chest: No acute abnormality. Hepatobiliary: No focal liver abnormality is seen. No gallstones, gallbladder wall thickening, or biliary dilatation. Pancreas: Unremarkable. No pancreatic ductal dilatation or surrounding inflammatory changes. Spleen: Normal in size without focal abnormality. Adrenals/Urinary Tract: The bladder is dilated.  There is moderate bilateral hydroureteronephrosis to the level of the bladder without obstructing calculus. Otherwise, the kidneys appear within normal limits. The adrenal glands are within normal limits. Stomach/Bowel: There is a small hiatal hernia. Stomach is otherwise within normal limits. Appendix appears normal. No evidence of bowel wall thickening, distention, or inflammatory changes. There is a large amount of stool throughout the colon. Vascular/Lymphatic: Aortic atherosclerosis. No enlarged abdominal or pelvic lymph nodes. Reproductive: Prostate is unremarkable. Other: There is a small fat containing left inguinal hernia. There is no ascites or free air. Musculoskeletal: Right hip arthroplasty is present. Bones are osteopenic. There has been progression of soft tissue defect overlying the posterior sacrum. There is new  sacral sclerosis thinning this level with new presacral edema. Findings are concerning for osteomyelitis. IMPRESSION: 1. Bladder is dilated. Moderate bilateral hydroureteronephrosis without obstructing calculi. Findings are concerning for bladder outlet obstruction. Recommend clinical correlation and follow-up. 2. Progression of soft tissue defect overlying the sacrum with new sacral thinning and sclerosis and new presacral edema. Findings are worrisome for osteomyelitis. Electronically Signed   By: Ronney Asters M.D.   On: 12/16/2020 22:20    Scheduled Meds:    darunavir-cobicistat  1 tablet Oral Daily   dolutegravir  50 mg Oral Daily   pantoprazole (PROTONIX) IV  40 mg Intravenous Q12H   polycarbophil  625 mg Oral Daily   rilpivirine  25 mg Oral Q breakfast   cyanocobalamin  100 mcg Oral Daily    Continuous Infusions:    lactated ringers 125 mL/hr at 12/17/20 1131     LOS: 0 days     Vernell Leep, MD,  FACP, [facility], [facility], [facility] (Care Management Physician Certified) [place]  To contact the attending provider between 7A-7P or  the covering provider during after hours 7P-7A, please log into the web site [email or web address] and access using universal [place] password for that web site. If you do not have the password, please call the hospital operator.  12/17/2020, 3:52 PM

## 2020-12-17 NOTE — ED Notes (Signed)
Holding PO medications at this time, per Hospitalist Dr. Janelle Floor order, pending GI consult.

## 2020-12-17 NOTE — Consult Note (Addendum)
Consultation  Referring Provider:   Dr. Algis Liming   Primary Care Physician:  [name], MD Primary Gastroenterologist:  Dr. Bryan Lemma    Reason for Consultation:   GI Bleed           HPI:   [name] [name] is a 60 y.o. male with a past medical history of HIV/AIDS, rectal cancer reported to be in remission, DVT on Eliquis, functional quadriplegia and others listed below, who presented to the hospital on 12/16/2020 from his SNF for rectal bleeding.    Recent history 05/12/2020 found after being down and had group B strep bacteremia, multiple subacute infarcts on head CT, initially on pressors, TEE negative at that time.  Also recent history of being put on Rocephin for abscess and sacral decubitus with osteomyelitis of the sacrum treated with antibiotics in August 2022.  Multiple foot wounds from PAD.  Then and was [place] in October with AKI from bank and AMS from baclofen.  Also arterial study showed severe PVD bilaterally.  Patient was transferred to a SNF and is being followed by palliative care.  He is DNR though not yet on hospice.    At time of presentation described rectal bleeding for the past 2 days with 4 episodes of bright red blood per rectum.  (On Eliquis since the summer 2022).  He has been on Ibuprofen twice daily for the past month.    Today, the patient explains that he has had some bleeding over the past 2 days, at time of my exam he is really unaware of how many times a day but tells me it has just been bright red blood and no stool but "quite a lot".  Tells me that he does not think he has had any since last night.  Remains very tender everywhere.    Along with this also describes an increase in indigestion/reflux symptoms recently.  Does have a history of increase in Ibuprofen use twice daily over the past month.  He is burping at time of my exam.     Denies any constipation, diarrhea, fevers, chills.  ED course: Hemoglobin 7.7 (slightly down from baseline around 8),  creatinine 2.0, BUN 84 (up from baseline)  GI history: 03/29/2020 exam under anesthesia by colorectal surgery: At that time noted fibrotic soft tissues in both ischio rectal fossa with nodular thickening and granularity circumferentially at the anal verge, biopsies completed, rectal exam is limited by the degree of fibrosis/soft tissue thickening of his buttocks 02/20/2020 office visit Alonza Bogus, PA-C for rectal bleeding: That time discussed patient had a prior history of HIV and anal cancer diagnosed in 2008 status post concurrent chemotherapy and radiation, history of DVT, had been referred for rectal bleeding and recommendations for colonoscopy on rectal exam at that time significant scar tissue and firm skin changes in the decubitus area, gluteal cleft on both buttocks, significant ulcerations and nodularity in the perianal area; at that time patient was referred to colorectal surgery given abnormal rectal exam, I recommended he have an exam under anesthesia with external biopsies  Past Medical History:  Diagnosis Date  . Arthritis   . Decubitus ulcer of sacral area   . DVT (deep venous thrombosis) ([place]) 2008   Left leg  . Exposure to hepatitis B   . H/O hypotension   . History of anemia   . History of gout   . History of transfusion   . 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  HIV (human immunodeficiency virus infection) ([place])   . Hx of sepsis 2015   affected rt hip / femur  . Pain in limb 07/23/2013  . PONV (postoperative nausea and vomiting)    Hiccups for 4 days after anesthesia  . Radiation   . 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 Shingles    left shoulder, right leg    Past Surgical History:  Procedure Laterality Date  . COLONOSCOPY    . CONVERSION TO TOTAL HIP Right 06/15/2014   Procedure: REMOVAL OF ANTIBIOTIC  SPACER AND CONVERSION TO RIGHT TOTAL HIP  ARTHROPLASTY ;  Surgeon: Paralee Cancel, MD;  Location: [facility];  Service: Orthopedics;  Laterality: Right;   . CYSTOSCOPY    . HEMATOMA EVACUATION    . HERNIA REPAIR     right X2  . INCISION AND DRAINAGE HIP Right 07/31/2013   Procedure: IRRIGATION AND DEBRIDEMENT HIP WITH PERCUTANEOUS DRAIN PLACEMENT;  Surgeon: [name], MD;  Location: [facility];  Service: Orthopedics;  Laterality: Right;  . INCISION AND DRAINAGE HIP Right 03/15/2015   Procedure: IRRIGATION AND DEBRIDEMENT RIGHT HIP WITH  HEAD BALL  AND POLY LINER EXCHANGE;  Surgeon: Paralee Cancel, MD;  Location: [facility];  Service: Orthopedics;  Laterality: Right;  . INCISION AND DRAINAGE OF WOUND N/A 08/25/2013   Procedure: IRRIGATION AND DEBRIDEMENT OF SACRAL ULCER WITH PLACEMENT OF A CELL ;  Surgeon: Theodoro Kos, DO;  Location: [facility];  Service: Plastics;  Laterality: N/A;  . INCISION AND DRAINAGE OF WOUND N/A 09/01/2013   Procedure: IRRIGATION AND DEBRIDEMENT SACRAL ULCER WITH PLACEMENT OF A CELL;  Surgeon: Theodoro Kos, DO;  Location: [facility];  Service: Plastics;  Laterality: N/A;  . INCISION AND DRAINAGE OF WOUND N/A 09/08/2013   Procedure: IRRIGATION AND DEBRIDEMENT OF SACRAL ULCER WITH PLACEMENT OF A CELL AND VAC;  Surgeon: Theodoro Kos, DO;  Location: [facility];  Service: Plastics;  Laterality: N/A;  . INCISION AND DRAINAGE OF WOUND N/A 09/18/2013   Procedure: IRRIGATION AND DEBRIDEMENT OF SACRAL WOUND WITH PLACEMENT OF A-CELL;  Surgeon: Theodoro Kos, DO;  Location: [facility];  Service: Plastics;  Laterality: N/A;  . PILONIDAL CYST DRAINAGE N/A 08/09/2013   Procedure: IRRIGATION AND DEBRIDEMENT SACRAL DECUBITUS;  Surgeon: Harl Bowie, MD;  Location: [facility];  Service: General;  Laterality: N/A;  . RECTAL BIOPSY N/A 03/29/2020   Procedure: EXCISION OF PERIANAL TISSUE WITH BX AND 3 PUNCH BXS;  Surgeon: Ileana Roup, MD;  [facility];  Service: General;  Laterality: N/A;  . TOTAL HIP ARTHROPLASTY Right 08/04/2013   Procedure: RESECTION HIP JOINT - PLACEMENT OF CEMENT PROSTHESIS;  Surgeon: [name], MD;  Location: [facility];  Service:  Orthopedics;  Laterality: Right;  . TRANSURETHRAL RESECTION OF PROSTATE  JAN 2016    Family History  Problem Relation Age of Onset  . Diabetes Mother   . Dementia Mother   . Atrial fibrillation Mother   . Hypertension Mother   . Cancer Father   . Emphysema Father     Social History   Tobacco Use  . Smoking status: Former    Packs/day: 0.30    Types: Cigarettes  . Smokeless tobacco: Never  Vaping Use  . Vaping Use: Never used  Substance Use Topics  . Alcohol use: Yes    Alcohol/week: 3.0 standard drinks    Types: 3 Standard drinks or equivalent per week  . Drug use: Yes    Types: Marijuana  Comment: occ.    Prior to Admission medications   Medication Sig Start Date End Date Taking? Authorizing Provider  Amino Acids-Protein Hydrolys (FEEDING SUPPLEMENT, PRO-STAT SUGAR FREE 64,) LIQD Take 30 mLs by mouth 3 (three) times daily.   Yes [provider]  apixaban (ELIQUIS) 5 MG TABS tablet Take 5 mg by mouth 2 (two) times daily.   Yes [provider]  bisacodyl (DULCOLAX) 10 MG suppository Place 10 mg rectally once as needed for moderate constipation.   Yes [provider]  cyanocobalamin 100 MCG tablet Take 100 mcg by mouth daily.   Yes [provider]  darunavir-cobicistat (PREZCOBIX) 800-150 MG tablet TAKE 1 TABLET BY MOUTH DAILY . SWALLOW WHOLE, DO NOT CRUSH, BREAK, OR CHEW TABLETS. TAKE WITH FOOD Patient taking differently: Take 1 tablet by mouth daily. 08/19/20  Yes [name], MD  Ensure (ENSURE) Take 237 mLs by mouth in the morning and at bedtime.   Yes [provider]  loperamide (IMODIUM A-D) 2 MG tablet Take 2-4 mg by mouth as needed for diarrhea or loose stools (max dose of 16mg /24 hours). 4mg  after 1st loose stool and 2mg  after each subsequent BM   Yes [provider]  megestrol (MEGACE ES) 625 MG/5ML suspension Take 5 mLs (625 mg total) by mouth daily. 07/20/20  Yes [name], MD  oxycodone  (OXY-IR) 5 MG capsule Take 5 mg by mouth See admin instructions. Give 5mg  by mouth daily for wound care. May also give 5mg  every 6 hours as needed for chronic pain   Yes [provider]  pantoprazole (PROTONIX) 20 MG tablet Take 20 mg by mouth daily.   Yes [provider]  polycarbophil (FIBERCON) 625 MG tablet Take 625 mg by mouth daily.   Yes [provider]  rilpivirine (EDURANT) 25 MG TABS tablet TAKE 1 TABLET (25 MG TOTAL) BY MOUTH DAILY WITH BREAKFAST Patient taking differently: Take 25 mg by mouth daily with breakfast. 08/19/20  Yes [name], MD  simethicone (MYLICON) 80 MG chewable tablet Chew 80 mg by mouth with breakfast, with lunch, and with evening meal.   Yes [provider]  TIVICAY 50 MG tablet TAKE 1 TABLET (50 MG TOTAL) BY MOUTH DAILY Patient taking differently: Take 50 mg by mouth daily. 08/19/20  Yes [name], MD  vitamin C (ASCORBIC ACID) 500 MG tablet Take 1,000 mg by mouth daily.   Yes [provider]    Current Facility-Administered Medications  Medication Dose Route Frequency Provider Last Rate Last Admin  . acetaminophen (TYLENOL) tablet 650 mg  650 mg Oral Q6H PRN Etta Quill, DO       Or  . acetaminophen (TYLENOL) suppository 650 mg  650 mg Rectal Q6H PRN Etta Quill, DO      . darunavir-cobicistat (PREZCOBIX) 800-150 MG per tablet 1 tablet  1 tablet Oral Daily Alcario Drought, Jared M, DO      . dolutegravir (TIVICAY) tablet 50 mg  50 mg Oral Daily Jennette Kettle M, DO      . HYDROmorphone (DILAUDID) injection 0.5-1 mg  0.5-1 mg Intravenous Q2H PRN Etta Quill, DO      . lactated ringers infusion   Intravenous Continuous Modena Jansky, MD      . ondansetron [facility]) tablet 4 mg  4 mg Oral Q6H PRN Etta Quill, DO       Or  . ondansetron [facility]) injection 4 mg  4 mg Intravenous Q6H PRN  Etta Quill, DO      . oxyCODONE (Oxy IR/ROXICODONE) immediate release tablet 5 mg  5 mg Oral Q6H  PRN Etta Quill, DO      . pantoprazole (PROTONIX) injection 40 mg  40 mg Intravenous Q12H Jennette Kettle M, DO      . polycarbophil (FIBERCON) tablet 625 mg  625 mg Oral Daily Alcario Drought, Jared M, DO      . rilpivirine [facility]) tablet 25 mg  25 mg Oral Q breakfast Alcario Drought, Jared M, DO      . vitamin B-12 (CYANOCOBALAMIN) tablet 100 mcg  100 mcg Oral Daily Etta Quill, DO       Current Outpatient Medications  Medication Sig Dispense Refill  . Amino Acids-Protein Hydrolys (FEEDING SUPPLEMENT, PRO-STAT SUGAR FREE 64,) LIQD Take 30 mLs by mouth 3 (three) times daily.    Marland Kitchen apixaban (ELIQUIS) 5 MG TABS tablet Take 5 mg by mouth 2 (two) times daily.    . bisacodyl (DULCOLAX) 10 MG suppository Place 10 mg rectally once as needed for moderate constipation.    . cyanocobalamin 100 MCG tablet Take 100 mcg by mouth daily.    . darunavir-cobicistat (PREZCOBIX) 800-150 MG tablet TAKE 1 TABLET BY MOUTH DAILY . SWALLOW WHOLE, DO NOT CRUSH, BREAK, OR CHEW TABLETS. TAKE WITH FOOD (Patient taking differently: Take 1 tablet by mouth daily.) 30 tablet 5  . Ensure (ENSURE) Take 237 mLs by mouth in the morning and at bedtime.    Marland Kitchen loperamide (IMODIUM A-D) 2 MG tablet Take 2-4 mg by mouth as needed for diarrhea or loose stools (max dose of 16mg /24 hours). 4mg  after 1st loose stool and 2mg  after each subsequent BM    . megestrol (MEGACE ES) 625 MG/5ML suspension Take 5 mLs (625 mg total) by mouth daily. 150 mL 0  . oxycodone (OXY-IR) 5 MG capsule Take 5 mg by mouth See admin instructions. Give 5mg  by mouth daily for wound care. May also give 5mg  every 6 hours as needed for chronic pain    . pantoprazole (PROTONIX) 20 MG tablet Take 20 mg by mouth daily.    . polycarbophil (FIBERCON) 625 MG tablet Take 625 mg by mouth daily.    . rilpivirine (EDURANT) 25 MG TABS tablet TAKE 1 TABLET (25 MG TOTAL) BY MOUTH DAILY WITH BREAKFAST (Patient taking differently: Take 25 mg by mouth daily with breakfast.) 30 tablet 5  .  simethicone (MYLICON) 80 MG chewable tablet Chew 80 mg by mouth with breakfast, with lunch, and with evening meal.    . TIVICAY 50 MG tablet TAKE 1 TABLET (50 MG TOTAL) BY MOUTH DAILY (Patient taking differently: Take 50 mg by mouth daily.) 30 tablet 5  . vitamin C (ASCORBIC ACID) 500 MG tablet Take 1,000 mg by mouth daily.      Allergies as of 12/16/2020 - Review Complete 12/16/2020  Allergen Reaction Noted  . Collagen Rash 06/04/2014     Review of Systems:    Constitutional: No fever or chills Skin: No rash  Cardiovascular: No chest pain Respiratory: No SOB Gastrointestinal: See HPI and otherwise negative Genitourinary: No dysuria  Neurological: No headache, dizziness or syncope Musculoskeletal: No new muscle or joint pain Hematologic: No bruising Psychiatric: No history of depression or anxiety    Physical Exam:  Vital signs in last 24 hours: Temp:  [98.3 F (36.8 C)] 98.3 F (36.8 C) (12/09 0824) Pulse Rate:  [73-92] 85 (12/09 0824) Resp:  [16-22] 16 (12/09 0824) BP: (101-152)/(73-102) 117/90 (  12/09 0824) SpO2:  [99 %-100 %] 100 % (12/09 0824)   General:  Chronically ill appearing, contracted Caucasian male appears to be in NAD, Well developed, alert and cooperative Head:  Normocephalic and atraumatic. Eyes:   PEERL, EOMI. No icterus. Conjunctiva pink. Ears:  Normal auditory acuity. Neck:  Supple Throat: Oral cavity and pharynx without inflammation, swelling or lesion.  Lungs: Respirations even and unlabored. Lungs clear to auscultation bilaterally.   No wheezes, crackles, or rhonchi. (Limited due to contractures and pain with movement) Heart: Normal S1, S2. No MRG. Regular rate and rhythm. No peripheral edema, cyanosis or pallor.  Abdomen:  Tense,  nondistended, Generalized abdominal ttp,  No rebound or guarding. Increase BS all four quadrants. No appreciable masses or hepatomegaly. Rectal:  Not performed. (Picture of rectum in admission note, patient very tender, so  deferred rectal exam) Msk:  Symmetrical without gross deformities. Peripheral pulses intact.  Extremities:  Without edema, no deformity or joint abnormality. Contracted Neurologic:  Alert and  oriented x4;  grossly normal neurologically.  Skin:   Dry and intact without significant lesions or rashes. Psychiatric: Demonstrates good judgement and reason without abnormal affect or behaviors.  LAB RESULTS: Recent Labs    12/16/20 1644 12/16/20 1654 12/16/20 2250 12/17/20 0605  WBC 9.9  --   --  12.0*  HGB 8.2* 7.8* 7.7* 7.4*  HCT 26.3* 23.0* 25.2* 24.4*  PLT 226  --   --  225   BMET Recent Labs    12/16/20 1644 12/16/20 1654 12/17/20 0605  NA 139 138 136  K 4.8 4.4 3.7  CL 112* 112* 112*  CO2 20*  --  16*  GLUCOSE 136* 127* 105*  BUN 80* 84* 71*  CREATININE 2.07* 2.30* 1.89*  CALCIUM 8.8*  --  8.3*   LFT Recent Labs    12/16/20 1644  PROT 5.9*  ALBUMIN 2.8*  AST 22  ALT 25  ALKPHOS 36*  BILITOT 0.4    STUDIES: CT ABDOMEN PELVIS WO CONTRAST  Result Date: 12/16/2020 CLINICAL DATA:  Acute abdominal pain.  Rectal bleeding. EXAM: CT ABDOMEN AND PELVIS WITHOUT CONTRAST TECHNIQUE: Multidetector CT imaging of the abdomen and pelvis was performed following the standard protocol without IV contrast. COMPARISON:  CT abdomen and pelvis 05/12/2020. FINDINGS: Lower chest: No acute abnormality. Hepatobiliary: No focal liver abnormality is seen. No gallstones, gallbladder wall thickening, or biliary dilatation. Pancreas: Unremarkable. No pancreatic ductal dilatation or surrounding inflammatory changes. Spleen: Normal in size without focal abnormality. Adrenals/Urinary Tract: The bladder is dilated. There is moderate bilateral hydroureteronephrosis to the level of the bladder without obstructing calculus. Otherwise, the kidneys appear within normal limits. The adrenal glands are within normal limits. Stomach/Bowel: There is a small hiatal hernia. Stomach is otherwise within normal limits.  Appendix appears normal. No evidence of bowel wall thickening, distention, or inflammatory changes. There is a large amount of stool throughout the colon. Vascular/Lymphatic: Aortic atherosclerosis. No enlarged abdominal or pelvic lymph nodes. Reproductive: Prostate is unremarkable. Other: There is a small fat containing left inguinal hernia. There is no ascites or free air. Musculoskeletal: Right hip arthroplasty is present. Bones are osteopenic. There has been progression of soft tissue defect overlying the posterior sacrum. There is new sacral sclerosis thinning this level with new presacral edema. Findings are concerning for osteomyelitis. IMPRESSION: 1. Bladder is dilated. Moderate bilateral hydroureteronephrosis without obstructing calculi. Findings are concerning for bladder outlet obstruction. Recommend clinical correlation and follow-up. 2. Progression of soft tissue defect overlying the sacrum with  new sacral thinning and sclerosis and new presacral edema. Findings are worrisome for osteomyelitis. Electronically Signed   By: Ronney Asters M.D.   On: 12/16/2020 22:20      Impression / Plan:   Impression: 1.  GI bleed with bright red blood per rectum: Hemoglobin 8.2 --> 7.4 overnight (baseline around 8-9), history of anal cancer status posttreatment, large sacral decubitus ulcer (see pictures in admission note), passing bright red blood multiple times over the past 2 days; consider stercoral ulcer versus colon cancer versus worsening of rectal disease 2.  AKI: Some improvement overnight, but this likely related to above 3.  HIV: On HAART 4.  Sacral osteomyelitis: Status posttreatment with antibiotics but CT scan concerning for sacral osteomyelitis, there is some question about whether these are new or just the known treated sacral osteomyelitis he had over the summer, patient with no fever and white blood cell count is normal 5.  Functional quadriplegia: No history of spinal cord injury, but  patient is bedridden and requires total care and assistance with mobility, feeding, grooming and other ADLs 6.  History of DVT: Previously on Eliquis, on hold now due to GI bleed, last dose 12/17/2018 2 PM 7.  Hematuria: "Cherry colored" urine output in Foley catheter bag per nursing  Plan: 1.  Continue PPI IV twice daily given rising BUN 2.  Hold Eliquis for now 3.  Agree with getting IDs recommendations in regards to question of sacral osteomyelitis on imaging 4.  Patient has been scheduled for an unprepped flex sigmoidoscopy with Dr. Carlean Purl today.  Scheduled for 115.  Did discuss risk benefits connotations and alternatives the patient and he agrees to proceed. 5.  Patient to remain n.p.o. for now.  Diet can be changed after time of procedure. 6. Continue to monitor hgb with transfusion as needed  Thank you for your kind consultation, we will continue to follow.  Lavone Nian [facility]  12/17/2020, 8:34 AM     GI Attending   I have taken an interval history, reviewed the chart and examined the patient. I agree with the Advanced Practitioner's note, impression and recommendations.  Majority the medical decision-making in the formulation of the assessment and plan were performed by me.   Complicated situation w/ hx rectal cancer, now w/ suspected sacral osteomyelitis and sig rectal bleeding on Eliquis (last dose last PM). Functional quadriplegia also.  We have decided to try an unprepped flex sig to see if I can ID and Tx bleeding.   Gatha Mayer, MD, [facility] 12/17/2020 1:30 PM

## 2020-12-17 NOTE — ED Notes (Signed)
Hospitalist messaged regarding pt's "cherry colored" urine output in foley catheter bag at this time. No further orders. Will continue to monitor.

## 2020-12-17 NOTE — Consult Note (Addendum)
WOC Nurse Consult Note: Pt is currently in a procedure this afternoon: consult was performed remotely after review of progress notes and photo of the sacrum in the EMR. Unable to assess leg wounds, but pt is familiar to [facility] team from previous admission on 10/3 and I will use those previous topical treatment orders for bedside nurses to perform.  Sacrum with chronic Stage 4 pressure injury; red with exposed bone, mod amt blood-tinged drainage.  Pt has osteomyelitis, according to the EMR. This complicated medical condition is beyond the scope of practice for [place] nurses; please refer to infectious disease team for further input regarding plan of care.  According to previous progress notes, pt has chronic non healing wounds of the left lateral and right lateral foot and heel    Dressing procedure/placement/frequency: Topical treatment orders provided for bedside nurses to perform as follows: air mattress to reduce pressure.  Aquacel to absorb drainage and provide antimicrobial benefits to BLE and sacrum Q day.  Silicone foam to the right lateral heel wound and to the left heel for protection; change every 3 days  Prevalon boots bilaterally to offload heels.  Please re-consult if further assistance is needed.  Thank-you,  Julien Girt MSN, [place], [place], [place], [place]

## 2020-12-17 NOTE — ED Notes (Addendum)
Charted in error.

## 2020-12-17 NOTE — ED Notes (Signed)
Pt attached to cardiac monitor x3. A&O x4. VSS. NAD.  °

## 2020-12-17 NOTE — ED Notes (Signed)
Report received from [place], [place] with Endoscopy. Per Ailene Ravel, Therapist, sports., bleeding rectal abscess noted during endoscopy.

## 2020-12-17 NOTE — ED Notes (Signed)
Pt back from Endoscopy at this time.

## 2020-12-17 NOTE — Progress Notes (Signed)
Received critical lab value, lactic acid 2.5. Dr Daiva Huge aware.

## 2020-12-17 NOTE — ED Notes (Signed)
Pt in Endo at this time.

## 2020-12-17 NOTE — Anesthesia Preprocedure Evaluation (Addendum)
Anesthesia Evaluation  Patient identified by MRN, date of birth, ID band Patient awake    Reviewed: Allergy & Precautions, NPO status , Patient's Chart, lab work & pertinent test results  History of Anesthesia Complications (+) PONV and history of anesthetic complications  Airway Mallampati: II  TM Distance: >3 FB Neck ROM: Full  Mouth opening: Limited Mouth Opening  Dental  (+) Missing, Poor Dentition,    Pulmonary neg pulmonary ROS, former smoker,    Pulmonary exam normal        Cardiovascular +CHF  Normal cardiovascular exam  TTE 05/2020: EF 12-24%, grade I diastolic dysfunction, valves ok   Neuro/Psych negative neurological ROS  negative psych ROS   GI/Hepatic Neg liver ROS, GERD  Medicated and Controlled,Rectal bleeding   Endo/Other  negative endocrine ROS  Renal/GU Renal InsufficiencyRenal disease  negative genitourinary   Musculoskeletal  (+) Arthritis ,   Abdominal   Peds  Hematology  (+) anemia , HIV, Hgb 7.9   Anesthesia Other Findings Day of surgery medications reviewed with patient.  Reproductive/Obstetrics negative OB ROS                           Anesthesia Physical Anesthesia Plan  ASA: 3 and emergent  Anesthesia Plan: MAC   Post-op Pain Management: Minimal or no pain anticipated   Induction:   PONV Risk Score and Plan: 2 and Treatment may vary due to age or medical condition and Propofol infusion  Airway Management Planned: Natural Airway and Simple Face Mask  Additional Equipment: None  Intra-op Plan:   Post-operative Plan:   Informed Consent: I have reviewed the patients History and Physical, chart, labs and discussed the procedure including the risks, benefits and alternatives for the proposed anesthesia with the patient or authorized representative who has indicated his/her understanding and acceptance.   Patient has DNR.  Discussed DNR with patient and  Suspend DNR.     Plan Discussed with: CRNA  Anesthesia Plan Comments: (DNR status discussed with patient. He would like to suspend DNR status periprocedurally. Daiva Huge, MD)       Anesthesia Quick Evaluation

## 2020-12-17 NOTE — Anesthesia Postprocedure Evaluation (Signed)
Anesthesia Post Note  Patient: [name] [name]  Procedure(s) Performed: [place]     Patient location during evaluation: PACU Anesthesia Type: MAC Level of consciousness: awake and alert and oriented Pain management: pain level controlled Vital Signs Assessment: post-procedure vital signs reviewed and stable Respiratory status: spontaneous breathing, nonlabored ventilation and respiratory function stable Cardiovascular status: blood pressure returned to baseline Postop Assessment: no apparent nausea or vomiting Anesthetic complications: no   No notable events documented.  Last Vitals:  Vitals:   12/17/20 1530 12/17/20 1600  BP: 103/73 107/68  Pulse: 77 82  Resp: 14 14  Temp:    SpO2: 100% 100%    Last Pain:  Vitals:   12/17/20 1436  TempSrc:   PainSc: 0-No pain   Pain Goal:                   Marthenia Rolling

## 2020-12-17 NOTE — ED Notes (Signed)
Pt resting comfortably. He denies any pain at this time. Attached to cardiac monitor x3. VSS.

## 2020-12-17 NOTE — Transfer of Care (Signed)
Immediate Anesthesia Transfer of Care Note  Patient: [name] [name]  Procedure(s) Performed: FLEXIBLE SIGMOIDOSCOPY  Patient Location: PACU  Anesthesia Type:MAC  Level of Consciousness: awake, alert , oriented and patient cooperative  Airway & Oxygen Therapy: Patient Spontanous Breathing and Patient connected to face mask oxygen  Post-op Assessment: Report given to RN and Post -op Vital signs reviewed and stable  Post vital signs: Reviewed and stable  Last Vitals:  Vitals Value Taken Time  BP 89/52 12/17/20 1357  Temp    Pulse 85 12/17/20 1359  Resp 21 12/17/20 1359  SpO2 100 % 12/17/20 1359  Vitals shown include unvalidated device data.  Last Pain:  Vitals:   12/17/20 1247  TempSrc: Oral  PainSc: 7          Complications: No notable events documented.

## 2020-12-17 NOTE — Op Note (Signed)
[facility] Patient Name: [name] [name] Procedure Date: 12/17/2020 MRN: [id number] Attending MD: Gatha Mayer , MD Date of Birth: 29-Oct-1960 CSN: [id number] Age: 60 Admit Type: Outpatient Procedure:                Flexible Sigmoidoscopy Indications:              Rectal hemorrhage Providers:                Gatha Mayer, MD, Grace Isaac, RN, Frazier Richards, Technician Referring MD:              Medicines:                Propofol per Anesthesia, Monitored Anesthesia Care Complications:            No immediate complications. Estimated Blood Loss:     Estimated blood loss: none. Procedure:                Pre-Anesthesia Assessment:                           - Prior to the procedure, a History and Physical                            was performed, and patient medications and                            allergies were reviewed. The patient's tolerance of                            previous anesthesia was also reviewed. The risks                            and benefits of the procedure and the sedation                            options and risks were discussed with the patient.                            All questions were answered, and informed consent                            was obtained. Prior Anticoagulants: The patient                            last took Eliquis (apixaban) 1 day prior to the                            procedure. ASA Grade Assessment: III - A patient                            with severe systemic disease. After reviewing the  risks and benefits, the patient was deemed in                            satisfactory condition to undergo the procedure.                           After obtaining informed consent, the scope was                            passed under direct vision. The CF-[id number] ([id number])                            Olympus colonoscope was introduced through the anus                             and advanced to the the sigmoid colon. The flexible                            sigmoidoscopy was accomplished without difficulty.                            The patient tolerated the procedure well. The                            quality of the bowel preparation was none. Scope In: 1:41:46 PM Scope Out: 1:46:38 PM Total Procedure Duration: 0 hours 4 minutes 52 seconds  Findings:      The perianal exam findings include blood clots, skin breakdown and       induration.      The digital rectal exam findings include decreased tone with indurated       and open canal.      Theree were clots of blood in anal canal which is indurated and       ulcerated. There is ulceration at the anorectum with bleeding in some       areas. Distortion of anatomy. The rectum and very distal sigmoid have       irregular fdriable mucosa with minimal contact bleeding. There is stool       on proximal rectum and distal sigmoid but no good evidence for bleeding       from there - though exam very limited. Impression:               - Blood clots, skin breakdown and induration found                            on perianal exam.                           - Decreased tone with indurated and open canal                            found on digital rectal exam.                           Ulcerated and distorted anal and perianal area with  bleeding. ? from osteomyelitis or other cause like                            pressure injury. Nothing amenable to endoscopic                            treatment. I do not think there is more proximal                            bleeding source.                           - No specimens collected. Moderate Sedation:      Not Applicable - Patient had care per Anesthesia. Recommendation:           - Return patient to hospital ward for ongoing care.                           - continue to hold anticoagulation                           - needs ID input re:  osteomyelitis - ? surgical help                           - I did not order diet but ok to eat from my                            perspective                           - ? hospice Procedure Code(s):        --- Professional ---                           [id number], Sigmoidoscopy, flexible; diagnostic,                            including collection of specimen(s) by brushing or                            washing, when performed (separate procedure) Diagnosis Code(s):        --- Professional ---                           K62.5, Hemorrhage of anus and rectum CPT copyright 2019 American Medical Association. All rights reserved. The codes documented in this report are preliminary and upon coder review may  be revised to meet current compliance requirements. Gatha Mayer, MD 12/17/2020 2:02:37 PM This report has been signed electronically. Number of Addenda: 0

## 2020-12-18 ENCOUNTER — Other Ambulatory Visit: Payer: Self-pay

## 2020-12-18 DIAGNOSIS — Z7189 Other specified counseling: Secondary | ICD-10-CM

## 2020-12-18 DIAGNOSIS — K626 Ulcer of anus and rectum: Secondary | ICD-10-CM

## 2020-12-18 LAB — BASIC METABOLIC PANEL
Anion gap: 4 — ABNORMAL LOW (ref 5–15)
BUN: 54 mg/dL — ABNORMAL HIGH (ref 6–20)
CO2: 20 mmol/L — ABNORMAL LOW (ref 22–32)
Calcium: 8.5 mg/dL — ABNORMAL LOW (ref 8.9–10.3)
Chloride: 116 mmol/L — ABNORMAL HIGH (ref 98–111)
Creatinine, Ser: 1.65 mg/dL — ABNORMAL HIGH (ref 0.61–1.24)
GFR, Estimated: 48 mL/min — ABNORMAL LOW (ref >60)
Glucose, Bld: 100 mg/dL — ABNORMAL HIGH (ref 70–99)
Potassium: 4.3 mmol/L (ref 3.5–5.1)
Sodium: 140 mmol/L (ref 135–145)

## 2020-12-18 LAB — VITAMIN B12: Vitamin B-12: 231 pg/mL (ref 180–914)

## 2020-12-18 LAB — CBC
HCT: 19.6 % — ABNORMAL LOW (ref 39.0–52.0)
Hemoglobin: 6 g/dL — CL (ref 13.0–17.0)
MCH: 34.1 pg — ABNORMAL HIGH (ref 26.0–34.0)
MCHC: 30.6 g/dL (ref 30.0–36.0)
MCV: 111.4 fL — ABNORMAL HIGH (ref 80.0–100.0)
Platelets: 181 10*3/uL (ref 150–400)
RBC: 1.76 MIL/uL — ABNORMAL LOW (ref 4.22–5.81)
RDW: 17.7 % — ABNORMAL HIGH (ref 11.5–15.5)
WBC: 9.7 10*3/uL (ref 4.0–10.5)
nRBC: 2.2 % — ABNORMAL HIGH (ref 0.0–0.2)

## 2020-12-18 LAB — PREPARE RBC (CROSSMATCH)

## 2020-12-18 LAB — MRSA NEXT GEN BY PCR, NASAL: MRSA by PCR Next Gen: DETECTED — AB

## 2020-12-18 MED ORDER — ACETAMINOPHEN 325 MG PO TABS
650.0000 mg | ORAL_TABLET | Freq: Once | ORAL | Status: AC
  Administered 2020-12-18: 650 mg via ORAL
  Filled 2020-12-18: qty 2

## 2020-12-18 MED ORDER — SULFAMETHOXAZOLE-TRIMETHOPRIM 800-160 MG PO TABS
1.0000 | ORAL_TABLET | Freq: Two times a day (BID) | ORAL | Status: DC
  Administered 2020-12-19 – 2020-12-20 (×4): 1 via ORAL
  Filled 2020-12-18 (×5): qty 1

## 2020-12-18 MED ORDER — DIPHENHYDRAMINE HCL 25 MG PO CAPS
25.0000 mg | ORAL_CAPSULE | Freq: Once | ORAL | Status: AC
  Administered 2020-12-19: 25 mg via ORAL
  Filled 2020-12-18 (×2): qty 1

## 2020-12-18 MED ORDER — SODIUM CHLORIDE 0.9% IV SOLUTION: Freq: Once | INTRAVENOUS | Status: DC

## 2020-12-18 MED ORDER — MUPIROCIN 2 % EX OINT
TOPICAL_OINTMENT | Freq: Two times a day (BID) | CUTANEOUS | Status: DC
  Filled 2020-12-18: qty 22

## 2020-12-18 NOTE — Progress Notes (Addendum)
   Patient Name: [name] [name] Date of Encounter: 12/18/2020, 9:36 AM    Subjective  The patient continues to have bloody output from the anorectal area.  Hemoglobin decreased again.  Family member in the room as well.   Objective  BP 119/89 (BP Location: Left Arm)   Pulse 70   Temp 97.9 F (36.6 C) (Oral)   Resp 18   SpO2 100%  Chronically ill white man in no acute distress lying in bed  Recent Labs  Lab 12/17/20 0605 12/17/20 1200 12/18/20 0543  HGB 7.4* 7.9* 6.0*  HCT 24.4* 25.7* 19.6*  WBC 12.0* 11.9* 9.7  PLT 225 228 181   Flexible sigmoidoscopy 12/17/2020  Blood clots, skin breakdown and induration found on perianal exam. - Decreased tone with indurated and open canal found on digital rectal exam. Ulcerated and distorted anal and perianal area with bleeding. ? from osteomyelitis or other cause like pressure injury. Nothing amenable to endoscopic treatment. I do not think there is more proximal bleeding source.   Assessment and Plan  Bleeding from the anal area question related to his osteomyelitis plus or minus pressure ulcers.  In the setting of anticoagulation.  Last Eliquis 2 PM 12/16/2020  Difficult problem and to the best of my knowledge I do not think this is rectal or GI bleeding but is related to possible osteomyelitis pressure ulcers and perhaps deformity due to anal cancer treatment.  Await input from additional specialists such as surgery and infectious disease.  Continue to let Eliquis washout.  Call us back if further questions and needed.  Signing off.   Gatha Mayer, MD, [facility] 12/18/2020 9:36 AM

## 2020-12-18 NOTE — Consult Note (Signed)
Palliative Medicine Inpatient Consult Note  Reason for consult:  GOC  HPI:  Per intake H&P by Dr. Jennette Kettle 12/17/2020--> "[name] [name] is a 60 y.o. male with medical history significant of HIV on ART, prior DVTs on eliquis, rectal CA reported to be in remission, functional quadriplegia.  Pt admitted to [facility] on 05/12/20 after being found down, had group B strep bacteremia, multiple subacute infarcts on head CT, initially on pressors.  TEE neg at that time.   Has h/o R THA, previously on suppressive ABx.  Aspiration of hip during hospital stay didn't grow bacteria but did have high WBC.   Put on rocephin.   Sacral decubitus with osteomyelitis of sacrum treated with ABx in Aug 2022.  B foot wounds from PAD.   In to [facility] in Oct with AKI from [place] and AMS from baclofen.   Arterial studies in LE showed severe PVD R, mild PVD L, bilateral CFV DVTs (on eliquis).  Not sure when these were done as I dont see these in system (getting this info from Dr. Algis Downs office note from 12/09/20).   At this point pt is in SNF, has functional quadriplegia, is being followed by [facility] care, is DNR though not yet on hospice.   Pt presents to the ED for evaluation of rectal bleeding for past 2 days.  4 episodes of BRBPR.  Has been on eliquis since summer 2022.     No constipation, diarrhea, fevers, chills, CP, SOB, N/V, melena.   Has been on ibuprofen BID for past month."  Clinical Assessment/Goals of Care: I have reviewed medical records including EPIC notes, labs and imaging, received report from bedside RN, Broadus John, and assessed the patient.    I met with [name] [name] to further discuss diagnosis prognosis, GOC, EOL wishes, disposition and options. 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 He has been at a SNF since last discharge. His sister is his support. He denies and faith, religious or cultural beliefs that impact care.    I introduced Palliative Medicine as specialized medical care for people  living with serious illness. It focuses on providing relief from the symptoms and stress of a serious illness. The goal is to improve quality of life for both the patient and the family.  A detailed discussion was had today regarding advanced directives.  Concepts specific to code status, artifical feeding and hydration, continued IV antibiotics and rehospitalization was had.  The difference between a aggressive medical intervention path  and a palliative comfort care path for this patient at this time was had. Values and goals of care important to patient and family were attempted to be elicited.   He has been followed by [facility] as outpatient. Chart had Yellow DNR form but no MOST on chart. MOST form completed today. DNR, Limited additional interventions, determine use of antibiotics when infection occurs, IV Fluids if indicated, No feeding tube.  Symptoms to manage include chronic pain of sacral area, nausea, fatigue. He state PT cut him off at [facility]. He wants to try to get back to normal.  We discussed how malnourished he is currently with 10 lb weight loss in last two months and the severity of his sacral wounds.  He wants continued treatment. He states it has been worse before. He is not ready for a comfort only approach.   Discussed the importance of continued conversation with family and their  medical providers regarding overall plan of care and treatment options, ensuring decisions  are within the context of the patients values and GOCs.  Decision Maker: Patient, Sister [name] [name]) Kayleen Memos if he cannot make decision for herself.   SUMMARY OF RECOMMENDATIONS    Code Status/Advance Care Planning:  DNAR/DNI  Symptom Management:  Chronic Pain: Acetaminophen prn, oxycodone for moderate pain  prn, Hydrocodone for severe pain prn Itching: diphenhydramine prn Nausea: ondansetron prn Fatigue: PT has able   Palliative Prophylaxis:  Constipation: Fibercon GI prophylaxis-  famotidine daily  Additional Recommendations (Limitations, Scope, Preferences): No ventilator No feeding tube No chemotherapy  No dialysis  Psycho-social/Spiritual:  Desire for further Chaplaincy support: no   Discharge Planning: He wants rehab at  discharge to help increase strength.   Vitals with BMI 12/18/2020 12/18/2020 12/18/2020  Height - - -  Weight - - -  BMI - - -  Systolic 583 167 425  Diastolic 89 68 83  [name] 70 74 75      PPS:     Thank you for the opportunity to participate in the care of this patient and family.   Total Time: 70 minutes Greater than 50%  of this time was spent counseling and coordinating care related to the above assessment and plan.  Lindell Spar, NP [facility] Palliative Medicine Team Team Cell Phone: [phone] Please utilize secure chat with additional questions, if there is no response within 30 minutes please call the above phone number  Palliative Medicine Team providers are available by phone from 7am to 7pm daily and can be reached through the team cell phone.  Should this patient require assistance outside of these hours, please call the patient's attending physician.

## 2020-12-18 NOTE — Progress Notes (Signed)
Lab reported a critical hemoglobin of 6.0. Provider Bl[place] notified. Awaiting orders. Will pass on information to oncoming RN.

## 2020-12-18 NOTE — Progress Notes (Signed)
PT Cancellation Note  Patient Details Name: [name] [name] MRN: [id number] DOB: 1960/09/20   Cancelled Treatment:    Reason Eval/Treat Not Completed: Medical issues which prohibited therapy Palliative Care Consult noted, pt with apparent decline but wants to continue tx measures Hgb 6.0 today. Defer PT at this time.   [facility] 12/18/2020, 9:14 AM

## 2020-12-18 NOTE — Progress Notes (Signed)
Previous nurse made writer aware of patient critical value of Hgb of 6.0 and that she notified the provider. She said to expect transfusion orders. No orders placed at this current time pertaining to PRBCs. Will report to oncoming nurse.

## 2020-12-18 NOTE — Progress Notes (Signed)
PROGRESS NOTE  [name] [name] PTW:[id number] DOB: 1960/08/30 DOA: 12/16/2020 PCP: [name], MD  HPI/Recap of past 24 hours: Patient seen and examined at bedside he denies any complaints Tolerating his diet   Assessment/Plan: Principal Problem:   GIB (gastrointestinal bleeding) Active Problems:   Human immunodeficiency virus (HIV) disease ([place])   Acute thromboembolism of deep veins of lower extremity ([place])   History of anal cancer   AKI (acute kidney injury) ([place])   Sacral osteomyelitis ([place])   Functional quadriplegia ([name])  #1 acute anemia of blood loss on chronic anemia His current hemoglobin is 6.0 He will be transfused with 2 units of packed RBC  2.  Acute kidney injury complicating stage II chronic kidney disease He was hydrated Will likely continue to monitor blood outlet obstruction We will continue to monitor  3.  Rectal bleed Patient seen by GI His Eliquis is on hold Continue IV fluid and PPI GI recommends to continue to hold anticoagulation  4.  Osteomyelitis Infectious disease does not think there is any indication for retreatment at this time  5.  Acute urinary retention Patient had a Foley catheter placed in ED during which a total of 700 mils of urine was removed he was started on Flomax For outpatient follow-up with urology on discharge  6.  HIV Patient is on HAART  7.  Large sacral decubitus ulcer with osteomyelitis Status post treatment with antibiotics ID does not think there was any need to retreat him Continue follow-up with wound care nurse and dietitian for nutritional optimization  8.  Functional quadriplegia PT OT evaluation  9.  Bilateral lower extremity DVT his Eliquis is being on hold due to GI bleed    Code Status: DNR  Severity of Illness: The appropriate patient status for this patient is INPATIENT. Inpatient status is judged to be reasonable and necessary in order to provide the required intensity of service to  ensure the patient's safety. The patient's presenting symptoms, physical exam findings, and initial radiographic and laboratory data in the context of their chronic comorbidities is felt to place them at high risk for further clinical deterioration. Furthermore, it is not anticipated that the patient will be medically stable for discharge from the hospital within 2 midnights of admission.  GI bleed Hemoglobin is 6 He needs blood transfusion * I certify that at the point of admission it is my clinical judgment that the patient will require inpatient hospital care spanning beyond 2 midnights from the point of admission due to high intensity of service, high risk for further deterioration and high frequency of surveillance required.*   Family Communication: None at bedside  Disposition Plan: We determined Status is: Inpatient   Dispo: The patient is from: Home              Anticipated d/c is to:               Anticipated d/c date is:               Patient currently not medically stable for discharge  Consultants: GI  Procedures: Flexible sigmoidoscopy 12/17/2020  Antimicrobials: None  DVT prophylaxis: None due to GI bleed No SCD due to concerns for bilateral lower extremity DVT   Objective: Vitals:   12/17/20 2043 12/18/20 0003 12/18/20 0435 12/18/20 0816  BP:  101/83 119/68 119/89  Pulse: 94 75 74 70  Resp:  18 16 18   Temp:  97.7 F (36.5 C) 98.7 F (37.1 C) 97.9  F (36.6 C)  TempSrc:  Oral Oral Oral  SpO2: 100% 100% 100% 100%    Intake/Output Summary (Last 24 hours) at 12/18/2020 1105 Last data filed at 12/17/2020 1930 Gross per 24 hour  Intake 300 ml  Output 1400 ml  Net -1100 ml   There were no vitals filed for this visit. There is no height or weight on file to calculate BMI.  Exam:  General: 60 y.o. year-old male well developed In no acute distress.  Alert and oriented x3. Cardiovascular: Regular rate and rhythm with no rubs or gallops.  No thyromegaly or JVD  noted.   Respiratory: Clear to auscultation with no wheezes or rales. Good inspiratory effort. Abdomen: Soft nontender nondistended with normal bowel sounds x4 quadrants. Musculoskeletal: No lower extremity edema. 2/4 pulses in all 4 extremities. Skin: Large sacral decubitus ulcer PER Pictures noted in the chart Psychiatry: Mood is appropriate for condition and setting Neurology: Paraplegia    Data Reviewed: CBC: Recent Labs  Lab 12/16/20 1644 12/16/20 1654 12/16/20 2250 12/17/20 0605 12/17/20 1200 12/18/20 0543  WBC 9.9  --   --  12.0* 11.9* 9.7  NEUTROABS 7.2  --   --   --   --   --   HGB 8.2* 7.8* 7.7* 7.4* 7.9* 6.0*  HCT 26.3* 23.0* 25.2* 24.4* 25.7* 19.6*  MCV 110.5*  --   --  113.0* 111.3* 111.4*  PLT 226  --   --  225 228 270   Basic Metabolic Panel: Recent Labs  Lab 12/16/20 1644 12/16/20 1654 12/17/20 0605 12/17/20 1200 12/18/20 0543  NA 139 138 136 140 140  K 4.8 4.4 3.7 4.3 4.3  CL 112* 112* 112* 114* 116*  CO2 20*  --  16* 18* 20*  GLUCOSE 136* 127* 105* 88 100*  BUN 80* 84* 71* 63* 54*  CREATININE 2.07* 2.30* 1.89* 1.78* 1.65*  CALCIUM 8.8*  --  8.3* 8.8* 8.5*   GFR: CrCl cannot be calculated (Unknown ideal weight.). Liver Function Tests: Recent Labs  Lab 12/16/20 1644  AST 22  ALT 25  ALKPHOS 36*  BILITOT 0.4  PROT 5.9*  ALBUMIN 2.8*   No results for input(s): LIPASE, AMYLASE in the last 168 hours. No results for input(s): AMMONIA in the last 168 hours. Coagulation Profile: No results for input(s): INR, PROTIME in the last 168 hours. Cardiac Enzymes: No results for input(s): CKTOTAL, CKMB, CKMBINDEX, TROPONINI in the last 168 hours. BNP (last 3 results) No results for input(s): PROBNP in the last 8760 hours. HbA1C: No results for input(s): HGBA1C in the last 72 hours. CBG: No results for input(s): GLUCAP in the last 168 hours. Lipid Profile: No results for input(s): CHOL, HDL, LDLCALC, TRIG, CHOLHDL, LDLDIRECT in the last 72  hours. Thyroid Function Tests: No results for input(s): TSH, T4TOTAL, FREET4, T3FREE, THYROIDAB in the last 72 hours. Anemia Panel: Recent Labs    12/18/20 0543  VITAMINB12 231   Urine analysis:    Component Value Date/Time   COLORURINE YELLOW 10/09/2020 1800   APPEARANCEUR CLEAR 10/09/2020 1800   LABSPEC 1.016 10/09/2020 1800   LABSPEC 1.030 04/11/2007 1446   PHURINE 5.0 10/09/2020 1800   GLUCOSEU NEGATIVE 10/09/2020 1800   HGBUR NEGATIVE 10/09/2020 1800   BILIRUBINUR NEGATIVE 10/09/2020 1800   BILIRUBINUR Negative 04/11/2007 1446   KETONESUR NEGATIVE 10/09/2020 1800   PROTEINUR NEGATIVE 10/09/2020 1800   UROBILINOGEN 0.2 06/10/2014 1303   NITRITE NEGATIVE 10/09/2020 1800   LEUKOCYTESUR NEGATIVE 10/09/2020 1800   LEUKOCYTESUR  Negative 04/11/2007 1446   Sepsis Labs: @LABRCNTIP (procalcitonin:4,lacticidven:4)  ) Recent Results (from the past 240 hour(s))  Resp Panel by RT-PCR (Flu A&B, Covid) Nasopharyngeal Swab     Status: None   Collection Time: 12/17/20 12:17 AM   Specimen: Nasopharyngeal Swab; Nasopharyngeal(NP) swabs in vial transport medium  Result Value Ref Range Status   SARS Coronavirus 2 by RT PCR NEGATIVE NEGATIVE Final    Comment: (NOTE) SARS-CoV-2 target nucleic acids are NOT DETECTED.  The SARS-CoV-2 RNA is generally detectable in upper respiratory specimens during the acute phase of infection. The lowest concentration of SARS-CoV-2 viral copies this assay can detect is 138 copies/mL. A negative result does not preclude SARS-Cov-2 infection and should not be used as the sole basis for treatment or other patient management decisions. A negative result may occur with  improper specimen collection/handling, submission of specimen other than nasopharyngeal swab, presence of viral mutation(s) within the areas targeted by this assay, and inadequate number of viral copies(<138 copies/mL). A negative result must be combined with clinical observations, patient  history, and epidemiological information. The expected result is Negative.  Fact Sheet for Patients:  EntrepreneurPulse.com.au  Fact Sheet for Healthcare Providers:  IncredibleEmployment.be  This test is no t yet approved or cleared by the [place] FDA and  has been authorized for detection and/or diagnosis of SARS-CoV-2 by FDA under an Emergency Use Authorization (EUA). This EUA will remain  in effect (meaning this test can be used) for the duration of the COVID-19 declaration under Section 564(b)(1) of the Act, 21 U.S.C.section 360bbb-3(b)(1), unless the authorization is terminated  or revoked sooner.       Influenza A by PCR NEGATIVE NEGATIVE Final   Influenza B by PCR NEGATIVE NEGATIVE Final    Comment: (NOTE) The Xpert Xpress SARS-CoV-2/FLU/RSV plus assay is intended as an aid in the diagnosis of influenza from Nasopharyngeal swab specimens and should not be used as a sole basis for treatment. Nasal washings and aspirates are unacceptable for Xpert Xpress SARS-CoV-2/FLU/RSV testing.  Fact Sheet for Patients: EntrepreneurPulse.com.au  Fact Sheet for Healthcare Providers: IncredibleEmployment.be  This test is not yet approved or cleared by the [place] FDA and has been authorized for detection and/or diagnosis of SARS-CoV-2 by FDA under an Emergency Use Authorization (EUA). This EUA will remain in effect (meaning this test can be used) for the duration of the COVID-19 declaration under Section 564(b)(1) of the Act, 21 U.S.C. section 360bbb-3(b)(1), unless the authorization is terminated or revoked.  Performed at [facility], [place] [address] [place], [place] [address]       Studies: No results found.  Scheduled Meds:  Chlorhexidine Gluconate Cloth  6 each Topical Daily   darunavir-cobicistat  1 tablet Oral Daily   dolutegravir  50 mg Oral Daily   famotidine  20  mg Oral Daily   polycarbophil  625 mg Oral Daily   rilpivirine  25 mg Oral Q breakfast   cyanocobalamin  100 mcg Oral Daily    Continuous Infusions:   LOS: 1 day     Cristal Deer, MD [facility]  To reach me or the doctor on call, go to: [email or web address] Password [email or web address]  12/18/2020, 11:05 AM

## 2020-12-19 DIAGNOSIS — T148XXA Other injury of unspecified body region, initial encounter: Secondary | ICD-10-CM

## 2020-12-19 DIAGNOSIS — R338 Other retention of urine: Secondary | ICD-10-CM

## 2020-12-19 DIAGNOSIS — D62 Acute posthemorrhagic anemia: Secondary | ICD-10-CM

## 2020-12-19 DIAGNOSIS — L89154 Pressure ulcer of sacral region, stage 4: Principal | ICD-10-CM

## 2020-12-19 DIAGNOSIS — R627 Adult failure to thrive: Secondary | ICD-10-CM

## 2020-12-19 HISTORY — DX: Acute posthemorrhagic anemia: D62

## 2020-12-19 LAB — CBC
HCT: 22 % — ABNORMAL LOW (ref 39.0–52.0)
Hemoglobin: 7.1 g/dL — ABNORMAL LOW (ref 13.0–17.0)
MCH: 33.2 pg (ref 26.0–34.0)
MCHC: 32.3 g/dL (ref 30.0–36.0)
MCV: 102.8 fL — ABNORMAL HIGH (ref 80.0–100.0)
Platelets: 150 10*3/uL (ref 150–400)
RBC: 2.14 MIL/uL — ABNORMAL LOW (ref 4.22–5.81)
RDW: 20.7 % — ABNORMAL HIGH (ref 11.5–15.5)
WBC: 9.7 10*3/uL (ref 4.0–10.5)
nRBC: 2.1 % — ABNORMAL HIGH (ref 0.0–0.2)

## 2020-12-19 LAB — HEMOGLOBIN AND HEMATOCRIT, BLOOD
HCT: 24.4 % — ABNORMAL LOW (ref 39.0–52.0)
Hemoglobin: 8.3 g/dL — ABNORMAL LOW (ref 13.0–17.0)

## 2020-12-19 LAB — PREPARE RBC (CROSSMATCH)

## 2020-12-19 MED ORDER — PROSOURCE PLUS PO LIQD
30.0000 mL | Freq: Two times a day (BID) | ORAL | Status: DC
  Administered 2020-12-19 – 2020-12-23 (×7): 30 mL via ORAL
  Filled 2020-12-19 (×7): qty 30

## 2020-12-19 MED ORDER — ASCORBIC ACID 500 MG PO TABS
500.0000 mg | ORAL_TABLET | Freq: Two times a day (BID) | ORAL | Status: DC
  Administered 2020-12-19 – 2020-12-23 (×8): 500 mg via ORAL
  Filled 2020-12-19 (×8): qty 1

## 2020-12-19 MED ORDER — SODIUM CHLORIDE 0.9% IV SOLUTION: Freq: Once | INTRAVENOUS | Status: AC

## 2020-12-19 MED ORDER — JUVEN PO PACK
1.0000 | PACK | Freq: Two times a day (BID) | ORAL | Status: DC
  Administered 2020-12-20 – 2020-12-23 (×6): 1 via ORAL
  Filled 2020-12-19 (×9): qty 1

## 2020-12-19 MED ORDER — ENSURE ENLIVE PO LIQD
237.0000 mL | ORAL | Status: DC
  Administered 2020-12-19 – 2020-12-22 (×4): 237 mL via ORAL

## 2020-12-19 MED ORDER — ZINC SULFATE 220 (50 ZN) MG PO CAPS
220.0000 mg | ORAL_CAPSULE | Freq: Every day | ORAL | Status: DC
  Administered 2020-12-19 – 2020-12-23 (×5): 220 mg via ORAL
  Filled 2020-12-19 (×5): qty 1

## 2020-12-19 NOTE — Progress Notes (Signed)
PT Cancellation Note  Patient Details Name: [name] [name] MRN: [id number] DOB: November 09, 1960   Cancelled Treatment:    Reason Eval/Treat Not Completed: Medical issues which prohibited therapy. Pt just started to receive a unit of blood due to low Hgb.  Will check back as schedule permits.     Galen Manila 12/19/2020, 9:55 AM

## 2020-12-19 NOTE — Assessment & Plan Note (Addendum)
--   Possible diagnosis.  PT evaluation recommended no follow-up.  Long-term care.

## 2020-12-19 NOTE — Assessment & Plan Note (Signed)
--   Continue HAART, follow-up with infectious disease as an outpatient.

## 2020-12-19 NOTE — Progress Notes (Signed)
Progress Note [name] [name]   YSA:[id number]  DOB: [birth year]  DOA: 12/16/2020     2 Date of Service: 12/19/2020   Clinical Course 60 year old man complicated PMH including HIV, DVT, anal cancer status postchemotherapy and radiation therapy, functional paraplegia, chronic sacral wound with previous osteomyelitis, presenting with rectal bleeding concerning for GI bleed.  Eliquis was held, admitted for further evaluation, seen by gastroenterology, flexible sigmoidoscopy performed.  Per gastroenterology, bleeding thought to be possibly related to wound, not GI in nature, recommended surgical consultation.  This will be pursued tomorrow.  Hospitalization complicated by acute blood loss anemia requiring transfusion and acute kidney injury.  Check CBC tomorrow after transfusion, follow-up surgical consultation tomorrow.  Assessment and Plan * Bleeding from wound -- Initially thought to have GI bleed, but was seen by gastroenterology and had flexible sigmoidoscopy, per GI bleeding is thought to be non-GI in nature, probably from wound.  GI recommended consideration of hospice.  Was evaluated in March of this year for rectal bleeding at that time no evidence of recurrence of cancer.  ABLA (acute blood loss anemia) -- Thought to be secondary to bleeding from wound.  Did require transfusion PRBCs today.  No ongoing bleeding noted.  Repeat CBC in a.m.  Sacral decubitus ulcer, stage IV (HCC) -- Bleeding from this wound is thought to be the culprit, however in discussion with nurse today, no evidence of current bleeding.  Did require blood transfusion.  We will closely monitor and consult surgery tomorrow for further recommendations. -- Per previous physician, communication with ID recommended no treatment for osteomyelitis at this time.  Human immunodeficiency virus (HIV) disease ([place]) -- Continue HAART, follow-up with infectious disease as an outpatient.  Functional quadriplegia (HCC) -- Possible  diagnosis.  PT evaluation.  OT evaluation.  AKI (acute kidney injury) ([place]) -- Improving, baseline difficult to ascertain but creatinine was 1.25 October 5 this year.  Thought to be prerenal in nature.  Improved with IV fluids.  Consider renal ultrasound in a few days to ensure resolution of bilateral hydroureteronephrosis seen on CT thought secondary to bladder outlet obstruction.  Acute urinary retention -- Started on Flomax, Foley catheter placed in the emergency department, plan voiding trial tomorrow.  Acute thromboembolism of deep veins of lower extremity (HCC) -- Unclear when this was diagnosed, possibly started on Eliquis end of November.  Eliquis currently on hold given significant bleeding, reevaluate tomorrow and consider resuming.  FTT (failure to thrive) in adult -- Appreciate palliative care.  Subjective:  Feels okay, about the same compared to yesterday.  Per RN there was some problem with the blood bank last night and patient was unable to receive transfusion until this morning  Objective Vitals:   12/19/20 0945 12/19/20 0950 12/19/20 1157 12/19/20 1500  BP: 116/77 116/77 118/72 134/80  Pulse: 70 70 71 81  Resp: 18 18 18 20   Temp: 98.7 F (37.1 C) 98.7 F (37.1 C) 98.8 F (37.1 C) 98.3 F (36.8 C)  TempSrc: Oral  Oral Oral  SpO2: 100% 100% 100% 100%     Vital signs were reviewed and unremarkable.  Exam Physical Exam Vitals reviewed.  Constitutional:      General: He is not in acute distress.    Appearance: He is not ill-appearing.  Cardiovascular:     Rate and Rhythm: Normal rate and regular rhythm.     Heart sounds: No murmur heard. Pulmonary:     Effort: Pulmonary effort is normal. No respiratory distress.     Breath  sounds: No wheezing, rhonchi or rales.  Musculoskeletal:     Right lower leg: No edema.     Left lower leg: No edema.  Neurological:     Mental Status: He is alert.  Psychiatric:        Mood and Affect: Mood normal.        Behavior:  Behavior normal.    Labs / Other Information My review of labs, imaging, notes and other tests is significant for     Hemoglobin up to 8.3 appropriately after 2 units PRBC.  Plan check CBC in AM.  Check BMP in AM.  Disposition Plan: Status is: Inpatient  Remains inpatient appropriate because: Acute blood loss anemia secondary to bleeding from wound.  SCDs  Time spent: 35 minutes [facility] 12/19/2020, 7:12 PM

## 2020-12-19 NOTE — Hospital Course (Addendum)
60 year old man complicated PMH including HIV, DVT, anal cancer status postchemotherapy and radiation therapy, functional paraplegia, chronic sacral wound with previous osteomyelitis, presenting with rectal bleeding concerning for GI bleed.  Eliquis was held, admitted for further evaluation, seen by gastroenterology, flexible sigmoidoscopy performed.  Per gastroenterology, bleeding thought to be possibly related to wound, not GI in nature, recommended surgical consultation.  Surgery recommended conservative management.  No further bleeding noted.  Starting enoxaparin today, if stable, start apixaban tomorrow and then can pursue discharge Thursday.

## 2020-12-19 NOTE — Assessment & Plan Note (Addendum)
--   Started on Flomax, check renal u/s, if hydro resolved, can proceed with voiding trial 12/14.

## 2020-12-19 NOTE — Assessment & Plan Note (Addendum)
--   Baseline difficult to ascertain but creatinine was 1.25 October 5 this year.  Thought to be prerenal in nature.  Improved with IV fluids.  Check renal ultrasound to assess for resolution of bilateral hydroureteronephrosis seen on CT thought secondary to bladder outlet obstruction.

## 2020-12-19 NOTE — Assessment & Plan Note (Addendum)
--   Thought to be secondary to bleeding from wound.  Hemoglobin up appropriately status post 2 units PRBC 12/11.  Hemoglobin stable today. -- Seen by general surgery, no active bleeding now, recommend conservative management, wound care. --Start enoxaparin.  Check CBC in a.m., if stable, transition to apixaban.

## 2020-12-19 NOTE — Assessment & Plan Note (Signed)
--   Appreciate palliative care.

## 2020-12-19 NOTE — Assessment & Plan Note (Addendum)
--   Diagnosed early December.  Started on Eliquis presumptively, end of November.  Eliquis currently on hold given significant bleeding, starting Lovenox, if no further bleeding, apixaban the following day.

## 2020-12-19 NOTE — Assessment & Plan Note (Addendum)
--   Initially thought to have GI bleed, but was seen by gastroenterology and had flexible sigmoidoscopy, per GI bleeding is thought to be non-GI in nature, probably from wound.  GI recommended consideration of hospice.  Was evaluated in March of this year for rectal bleeding at that time no evidence of recurrence of cancer. Eliquis contributed to bleeding from wound -- General surgery saw as above, no current bleeding, conservative management recommended, plan as above.

## 2020-12-19 NOTE — Progress Notes (Signed)
Initial Nutrition Assessment RD working remotely.  DOCUMENTATION CODES:   Not applicable  INTERVENTION:  - will order Ensure Enlive once/day each supplement provides 350 kcal and 20 grams of protein. - will order 1 packet Juven BID, each packet provides 95 calories, 2.5 grams of protein (collagen), and 9.8 grams of carbohydrate (3 grams sugar); also contains 7 grams of L-arginine and L-glutamine, 300 mg vitamin C, 15 mg vitamin E, 1.2 mcg vitamin B-12, 9.5 mg zinc, 200 mg calcium, and 1.5 g  Calcium Beta-hydroxy-Beta-methylbutyrate to support wound healing. - will order 30 ml Prosource Plus BID, each supplement provides 100 kcal and 15 grams protein.  - weigh patient today.  - recommend 500 mg ascorbic acid BID and 220 mg zinc sulfate/day to aid in wound healing.  - complete NFPE when feasible.    NUTRITION DIAGNOSIS:   Increased nutrient needs related to acute illness, wound healing as evidenced by estimated needs.  GOAL:   Patient will meet greater than or equal to 90% of their needs  MONITOR:   PO intake, Supplement acceptance, Labs, Weight trends, Skin  REASON FOR ASSESSMENT:   Malnutrition Screening Tool, Consult Assessment of nutrition requirement/status  ASSESSMENT:   60 y.o. male with medical history of HIV on ART, prior DVTs on eliquis, rectal cancer reported to be in remission, arthritis, anemia, sacral decubitus, and functional quadriplegia. He presented to the ED for evaluation of rectal bleeding for 2 days and 4 episodes of BRBPR.  Did not attempt to call as patient noted to have hx of functional quadriplegia. He was last seen by a [place] RD on 10/11/20 at which time he met criteria for severe malnutrition related to chronic illness (HIV) as evidenced by severe fat depletion, severe muscle depletion, percent weight loss.  Diet advanced to CLD on 12/9 at 0118 and then changed to NPO on 12/9 at 0135. Advanced from NPO to Regular on 12/9 at 1414. No meal  completion percentages documented during this hospitalization.   He has not been weighed since 10/09/20. Deep pitting edema to BLE documented in the edema section of flow sheet.    Labs reviewed; Cl: 116 mmol/l, BUN: 54 mg/dl, creatinine: 1.65 mg/dl, Ca: 8.5 mg/dl, GFR: 48 ml/min.  Medications reviewed; 20 mg oral pepcid/day, 625 mg fibercon/day, 100 mcg oral cyanocobalamin/day.     NUTRITION - FOCUSED PHYSICAL EXAM:  RD working remotely.   Diet Order:   Diet Order             Diet regular Room service appropriate? Yes; Fluid consistency: Thin  Diet effective now                   EDUCATION NEEDS:   Not appropriate for education at this time  Skin:  Skin Assessment: Skin Integrity Issues: Skin Integrity Issues:: Stage IV Stage IV: sacrum  Last BM:  12/11 (type 6 x1, small amount)  Height:   Ht Readings from Last 1 Encounters:  10/09/20 6' 2"  (1.88 m)    Weight:   Wt Readings from Last 1 Encounters:  10/09/20 56.6 kg     Estimated Nutritional Needs:  Kcal:  2100-2350 kcal Protein:  110-125 grams Fluid:  >/= 1.7 L/day      Jarome Matin, MS, RD, LDN, CNSC Inpatient Clinical Dietitian RD pager # available in [facility]  After hours/weekend pager # available in [facility]

## 2020-12-19 NOTE — Progress Notes (Signed)
Called blood lab, blood is ready for patient. Will get pre-vitals and get blood.

## 2020-12-19 NOTE — Assessment & Plan Note (Addendum)
--   Bleeding from this wound is thought to be the culprit,no evidence of current bleeding.  Did require blood transfusion.   -- Per previous physician, communication with ID recommended no treatment for osteomyelitis at this time. --Wound care.

## 2020-12-20 LAB — TYPE AND SCREEN
ABO/RH(D): B POS
ABO/RH(D): B POS
Antibody Screen: NEGATIVE
Antibody Screen: NEGATIVE
Unit division: 0
Unit division: 0
Unit division: 0
Unit division: 0

## 2020-12-20 LAB — BASIC METABOLIC PANEL
Anion gap: 6 (ref 5–15)
BUN: 33 mg/dL — ABNORMAL HIGH (ref 6–20)
CO2: 19 mmol/L — ABNORMAL LOW (ref 22–32)
Calcium: 7.6 mg/dL — ABNORMAL LOW (ref 8.9–10.3)
Chloride: 111 mmol/L (ref 98–111)
Creatinine, Ser: 1.5 mg/dL — ABNORMAL HIGH (ref 0.61–1.24)
GFR, Estimated: 54 mL/min — ABNORMAL LOW (ref >60)
Glucose, Bld: 103 mg/dL — ABNORMAL HIGH (ref 70–99)
Potassium: 3.8 mmol/L (ref 3.5–5.1)
Sodium: 136 mmol/L (ref 135–145)

## 2020-12-20 LAB — CBC
HCT: 23.7 % — ABNORMAL LOW (ref 39.0–52.0)
Hemoglobin: 7.6 g/dL — ABNORMAL LOW (ref 13.0–17.0)
MCH: 32.8 pg (ref 26.0–34.0)
MCHC: 32.1 g/dL (ref 30.0–36.0)
MCV: 102.2 fL — ABNORMAL HIGH (ref 80.0–100.0)
Platelets: 147 10*3/uL — ABNORMAL LOW (ref 150–400)
RBC: 2.32 MIL/uL — ABNORMAL LOW (ref 4.22–5.81)
RDW: 20.1 % — ABNORMAL HIGH (ref 11.5–15.5)
WBC: 8 10*3/uL (ref 4.0–10.5)
nRBC: 2.9 % — ABNORMAL HIGH (ref 0.0–0.2)

## 2020-12-20 LAB — BPAM RBC
Blood Product Expiration Date: 202212272359
Blood Product Expiration Date: 202212292359
Blood Product Expiration Date: 202301012359
Blood Product Expiration Date: 202301012359
ISSUE DATE / TIME: 202212101933
ISSUE DATE / TIME: 202212110058
ISSUE DATE / TIME: 202212110913
Unit Type and Rh: 7300
Unit Type and Rh: 7300
Unit Type and Rh: 7300
Unit Type and Rh: 7300

## 2020-12-20 MED ORDER — ZINC OXIDE 12.8 % EX OINT
TOPICAL_OINTMENT | Freq: Three times a day (TID) | CUTANEOUS | Status: DC
  Filled 2020-12-20: qty 56.7

## 2020-12-20 NOTE — Care Management Important Message (Signed)
Important Message  Patient Details IM Letter placed in Patients room. Name: [name] [name] MRN: [id number] Date of Birth: 01/28/1960   Medicare Important Message Given:  Yes     [name] 12/20/2020, 11:21 AM

## 2020-12-20 NOTE — Evaluation (Signed)
Pa=Physical Therapy Evaluation Patient Details Name: [name] [name] MRN: [id number] DOB: Jul 07, 1960 Today's Date: 12/20/2020  History of Present Illness  60 year old man complicated PMH including HIV, DVT, anal cancer status postchemotherapy and radiation therapy, functional paraplegia, chronic sacral wound with previous osteomyelitis, presenting with rectal bleeding concerning for GI bleed, seen by gastroenterology, flexible sigmoidoscopy performed.  Per gastroenterology, bleeding thought to be possibly related to wound, surgery consulted and has signed off.  Clinical Impression  PT evaluation very limited. Patient comes from [facility].Patient  stating another procedure is to be done. Patient could not state what he thought was going to happen.Encouraged  patient that nothing was scheduled, still declined. Assisted in repositioning in bed and  attempted rolling. Patient's legs and trunk quite rigid. Patient reports that he  ambulates. Unsure of reliability . Will provide  PT on a trial basis to establish prior function. Patient has multiple wounds on buttocks and feet.  Repositioned so his feet were not jammed onto foot board as patient unable to self reposition.  Continue  PT assessment for appropriateness for skilled PT.     Recommendations for follow up therapy are one component of a multi-disciplinary discharge planning process, led by the attending physician.  Recommendations may be updated based on patient status, additional functional criteria and insurance authorization.  Follow Up Recommendations Long-term institutional care without follow-up therapy    Assistance Recommended at Discharge Intermittent Supervision/Assistance  Functional Status Assessment Patient has not had a recent decline in their functional status  Equipment Recommendations  None recommended by PT    Recommendations for Other Services       Precautions / Restrictions Precautions Precautions: Fall Precaution  Comments: multiple wounds on buttocks and feet. Restrictions Weight Bearing Restrictions: No      Mobility  Bed Mobility Overal bed mobility: Needs Assistance             General bed mobility comments: attempted sitting up, patient declined further stating"I want to get that  doctor in here to do what  he's going to do."    Transfers                        Ambulation/Gait                  Stairs            Wheelchair Mobility    Modified Rankin (Stroke Patients Only)       Balance                                             Pertinent Vitals/Pain Pain Assessment: Faces Pain Location: buttocks Pain Descriptors / Indicators: Aching;Guarding;Grimacing;Moaning Pain Intervention(s): Limited activity within patient's tolerance    Home Living Family/patient expects to be discharged to:: Skilled nursing facility                   Additional Comments: unsure, patient reports he walks with CNA's" Stated It isn't pretty."    Prior Function Prior Level of Function : Needs assist  Cognitive Assist : ADLs (cognitive);Mobility (cognitive)     Physical Assist : Mobility (physical);ADLs (physical)     Mobility Comments: reliant on SNF staff       Hand Dominance        Extremity/Trunk Assessment   Upper Extremity Assessment Upper Extremity Assessment:  RUE deficits/detail;LUE deficits/detail RUE Deficits / Details: limed shoulder elevation, geeps arms tensed LUE Deficits / Details: same as right    Lower Extremity Assessment Lower Extremity Assessment: RLE deficits/detail;LLE deficits/detail RLE Deficits / Details: noted equinovarus deformity, wounds ontoes, limited ability to lift leg, appears extensor spasticipty LLE Deficits / Details: same as rightr , lateral foot wounds    Cervical / Trunk Assessment Cervical / Trunk Assessment: Other exceptions Cervical / Trunk Exceptions: NT as patient declined to  attempt sitting up.  Communication   Communication: No difficulties  Cognition Arousal/Alertness: Awake/alert Behavior During Therapy: WFL for tasks assessed/performed Overall Cognitive Status: No family/caregiver present to determine baseline cognitive functioning Area of Impairment: Orientation                 Orientation Level: Situation;Time             General Comments: states a doctor is coming to perform  something and he does not want to sit up.(no noted procedure in the workd) surgery signed off.        General Comments      Exercises     Assessment/Plan    PT Assessment Patient needs continued PT services  PT Problem List Decreased strength;Decreased mobility;Impaired tone;Decreased range of motion;Decreased knowledge of precautions;Decreased activity tolerance;Decreased skin integrity;Decreased balance;Decreased knowledge of use of DME;Pain;Impaired sensation       PT Treatment Interventions Therapeutic activities;Functional mobility training;Therapeutic exercise;Patient/family education;Cognitive remediation    PT Goals (Current goals can be found in the Care Plan section)  Acute Rehab PT Goals Patient Stated Goal: agreed to sit up another day PT Goal Formulation: Patient unable to participate in goal setting Time For Goal Achievement: 01/03/21 Potential to Achieve Goals: Poor    Frequency Min 1X/week   Barriers to discharge        Co-evaluation               AM-PAC PT "6 Clicks" Mobility  Outcome Measure Help needed turning from your back to your side while in a flat bed without using bedrails?: Total Help needed moving from lying on your back to sitting on the side of a flat bed without using bedrails?: Total Help needed moving to and from a bed to a chair (including a wheelchair)?: Total Help needed standing up from a chair using your arms (e.g., wheelchair or bedside chair)?: Total Help needed to walk in hospital room?: Total Help  needed climbing 3-5 steps with a railing? : Total 6 Click Score: 6    End of Session   Activity Tolerance: Patient limited by pain Patient left: in bed;with call bell/phone within reach Nurse Communication: Mobility status PT Visit Diagnosis: Unsteadiness on feet (R26.81);Other symptoms and signs involving the nervous system (R29.898)    Time: 1450-1500 PT Time Calculation (min) (ACUTE ONLY): 10 min   Charges:   PT Evaluation $PT Eval Low Complexity: [place] PT Acute Rehabilitation Services Pager [phone] Office [phone]   [name] 12/20/2020, 3:14 PM

## 2020-12-20 NOTE — Progress Notes (Signed)
Progress Note [name] [name]   ZOX:[id number]  DOB: Dec 07, 1960  DOA: 12/16/2020     3 Date of Service: 12/20/2020   Clinical Course 60 year old man complicated PMH including HIV, DVT, anal cancer status postchemotherapy and radiation therapy, functional paraplegia, chronic sacral wound with previous osteomyelitis, presenting with rectal bleeding concerning for GI bleed.  Eliquis was held, admitted for further evaluation, seen by gastroenterology, flexible sigmoidoscopy performed.  Per gastroenterology, bleeding thought to be possibly related to wound, not GI in nature, recommended surgical consultation.  This will be pursued tomorrow.  Hospitalization complicated by acute blood loss anemia requiring transfusion and acute kidney injury.  Check CBC tomorrow after transfusion, follow-up surgical consultation tomorrow.  Assessment and Plan * Bleeding from wound -- Initially thought to have GI bleed, but was seen by gastroenterology and had flexible sigmoidoscopy, per GI bleeding is thought to be non-GI in nature, probably from wound.  GI recommended consideration of hospice.  Was evaluated in March of this year for rectal bleeding at that time no evidence of recurrence of cancer. -- General surgery saw as above, no current bleeding, conservative management recommended, plan as above.  ABLA (acute blood loss anemia) -- Thought to be secondary to bleeding from wound.  Hemoglobin up appropriately status post 2 units PRBC 12/11. -- Seen by general surgery, no active bleeding now, recommend conservative management, wound care. -- Check CBC in a.m., if stable start Lovenox and then transition to apixaban the following day if hemoglobin remained stable and no bleeding.  Sacral decubitus ulcer, stage IV (HCC) -- Bleeding from this wound is thought to be the culprit,no evidence of current bleeding.  Did require blood transfusion.   -- Per previous physician, communication with ID recommended no treatment  for osteomyelitis at this time. --Wound care.  Human immunodeficiency virus (HIV) disease ([place]) -- Continue HAART, follow-up with infectious disease as an outpatient.  Functional quadriplegia (HCC) -- Possible diagnosis.  PT evaluation recommended no follow-up.  Long-term care.  AKI (acute kidney injury) ([place]) -- Improving, baseline difficult to ascertain but creatinine was 1.25 October 5 this year.  Thought to be prerenal in nature.  Improved with IV fluids.  Consider renal ultrasound in a few days to ensure resolution of bilateral hydroureteronephrosis seen on CT thought secondary to bladder outlet obstruction.  Acute urinary retention -- Started on Flomax, reportedly catheter is chronic.  Acute thromboembolism of deep veins of lower extremity ([place]) -- Diagnosed early December.  Started on Eliquis presumptively, end of November.  Eliquis currently on hold given significant bleeding, and hemoglobin stable tomorrow, start Lovenox, if no further bleeding, apixaban the following day.  FTT (failure to thrive) in adult -- Appreciate palliative care.   Subjective:  Feels ok, no bleeding noted, eating ok  Objective Vitals:   12/19/20 1500 12/19/20 2053 12/20/20 0428 12/20/20 1439  BP: 134/80 117/76 114/83 112/70  Pulse: 81 81 72 72  Resp: 20 16 16 20   Temp: 98.3 F (36.8 C) 98.7 F (37.1 C) 98.8 F (37.1 C) 97.9 F (36.6 C)  TempSrc: Oral Oral Oral Oral  SpO2: 100% 100% 99% 100%   Vital signs were reviewed and unremarkable.  Exam Physical Exam Vitals reviewed.  Constitutional:      General: He is not in acute distress.    Appearance: He is not ill-appearing or toxic-appearing.  Cardiovascular:     Rate and Rhythm: Normal rate and regular rhythm.     Heart sounds: No murmur heard. Pulmonary:  Effort: Pulmonary effort is normal. No respiratory distress.     Breath sounds: No wheezing, rhonchi or rales.  Neurological:     Mental Status: He is alert.  Psychiatric:         Mood and Affect: Mood normal.        Behavior: Behavior normal.    Labs / Other Information My review of labs, imaging, notes and other tests is significant for      Creatinine stable at 1.5, hemoglobin stable at 7.6 Disposition Plan: Status is: Inpatient  Remains inpatient appropriate because: Acute blood loss anemia, now stabilizing.  Plan as above.  SCDs  Time spent: 35 minutes [facility] 12/20/2020, 7:40 PM

## 2020-12-20 NOTE — Plan of Care (Signed)

## 2020-12-20 NOTE — Consult Note (Signed)
WOC Nurse Consult Note: Patient receiving care in [place]. Reason for Consult: perianal/intergluteal cleft care Wound type: MASD secondary to anal oozing Pressure Injury POA: Yes/No/NA Measurement: Wound bed: MASD Drainage (amount, consistency, odor)  Periwound: Dressing procedure/placement/frequency: Apply Triple Paste to the perianal and intergluteal cleft areas AFTER washing with soap and water, patting dry. IF Triple Paste is unavailable, can substitute Desitin. Thank you for the consult.  Discussed plan of care with the bedside nurse.  [facility] nurse will not follow at this time.  Please re-consult the [facility] team if needed.  Val Riles, RN, MSN, CWOCN, CNS-BC, pager [phone]

## 2020-12-20 NOTE — Consult Note (Addendum)
[facility] Surgery Consult Note  [name] [name] 01/17/1960  [id number].    Requesting MD: Sarajane Jews, MD Chief Complaint/Reason for Consult: sacral wound, ABL anemia   HPI:  [name] [name] is a 60 y/o M with a PMH HIV, anal cancer, DVTs on Eliquis, and functional quadriplegia who presented to the hospital from a SNF with cc rectal bleeding. According to the patient he was having rectal bleeding for about one week before he came in. He is on Eliquis for hx DVT's. He denies fever, chills, SOB, palpitations, constipation, or diarrhea. He reports being diagnosed with anal cancer in 2005, for which he underwent radiation and chemotherapy. He had an EUA, excision of perianal tissue, punch bx 02/2020 by Dr. Dema Severin which was negative for malignancy. He tells me that until 2 weeks ago he was mobilizing using a walker.   ROS: Review of Systems  All other systems reviewed and are negative.  Family History  Problem Relation Age of Onset   Diabetes Mother    Dementia Mother    Atrial fibrillation Mother    Hypertension Mother    Cancer Father    Emphysema Father     Past Medical History:  Diagnosis Date   Arthritis    Decubitus ulcer of sacral area    DVT (deep venous thrombosis) ([place]) 2008   Left leg   Exposure to hepatitis B    H/O hypotension    History of anemia    History of gout    History of transfusion    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    HIV (human immunodeficiency virus infection) ([place])    Hx of sepsis 2015   affected rt hip / femur   Pain in limb 07/23/2013   PONV (postoperative nausea and vomiting)    Hiccups for 4 days after anesthesia   Radiation    Rectal cancer (HCC)    Squamous cell   Shingles    left shoulder, right leg    Past Surgical History:  Procedure Laterality Date   COLONOSCOPY     CONVERSION TO TOTAL HIP Right 06/15/2014   Procedure: REMOVAL OF ANTIBIOTIC  SPACER AND CONVERSION TO RIGHT TOTAL HIP  ARTHROPLASTY ;  Surgeon:  Paralee Cancel, MD;  Location: [facility];  Service: Orthopedics;  Laterality: Right;   CYSTOSCOPY     HEMATOMA EVACUATION     HERNIA REPAIR     right X2   INCISION AND DRAINAGE HIP Right 07/31/2013   Procedure: IRRIGATION AND DEBRIDEMENT HIP WITH PERCUTANEOUS DRAIN PLACEMENT;  Surgeon: [name], MD;  Location: [place];  Service: Orthopedics;  Laterality: Right;   INCISION AND DRAINAGE HIP Right 03/15/2015   Procedure: IRRIGATION AND DEBRIDEMENT RIGHT HIP WITH  HEAD BALL  AND POLY LINER EXCHANGE;  Surgeon: Paralee Cancel, MD;  Location: [facility];  Service: Orthopedics;  Laterality: Right;   INCISION AND DRAINAGE OF WOUND N/A 08/25/2013   Procedure: IRRIGATION AND DEBRIDEMENT OF SACRAL ULCER WITH PLACEMENT OF A CELL ;  Surgeon: Theodoro Kos, DO;  Location: [place];  Service: Plastics;  Laterality: N/A;   INCISION AND DRAINAGE OF WOUND N/A 09/01/2013   Procedure: IRRIGATION AND DEBRIDEMENT SACRAL ULCER WITH PLACEMENT OF A CELL;  Surgeon: Theodoro Kos, DO;  Location: [place];  Service: Plastics;  Laterality: N/A;   INCISION AND DRAINAGE OF WOUND N/A 09/08/2013   Procedure: IRRIGATION AND DEBRIDEMENT OF SACRAL ULCER WITH PLACEMENT OF A CELL AND VAC;  Surgeon: Theodoro Kos,  DO;  Location: [place];  Service: Plastics;  Laterality: N/A;   INCISION AND DRAINAGE OF WOUND N/A 09/18/2013   Procedure: IRRIGATION AND DEBRIDEMENT OF SACRAL WOUND WITH PLACEMENT OF A-CELL;  Surgeon: Theodoro Kos, DO;  Location: [place];  Service: Plastics;  Laterality: N/A;   PILONIDAL CYST DRAINAGE N/A 08/09/2013   Procedure: IRRIGATION AND DEBRIDEMENT SACRAL DECUBITUS;  Surgeon: Harl Bowie, MD;  Location: [place];  Service: General;  Laterality: N/A;   RECTAL BIOPSY N/A 03/29/2020   Procedure: EXCISION OF PERIANAL TISSUE WITH BX AND 3 PUNCH BXS;  Surgeon: Ileana Roup, MD;  Location[facility];  Service: General;  Laterality: N/A;   TOTAL HIP ARTHROPLASTY Right 08/04/2013   Procedure: RESECTION HIP JOINT - PLACEMENT OF CEMENT  PROSTHESIS;  Surgeon: [name], MD;  Location: [place];  Service: Orthopedics;  Laterality: Right;   TRANSURETHRAL RESECTION OF PROSTATE  JAN 2016    Social History:  reports that he has quit smoking. His smoking use included cigarettes. He smoked an average of .3 packs per day. He has never used smokeless tobacco. He reports current alcohol use of about 3.0 standard drinks per week. He reports current drug use. Drug: Marijuana.  Allergies:  Allergies  Allergen Reactions   Collagen Rash    redness    Medications Prior to Admission  Medication Sig Dispense Refill   Amino Acids-Protein Hydrolys (FEEDING SUPPLEMENT, PRO-STAT SUGAR FREE 64,) LIQD Take 30 mLs by mouth 3 (three) times daily.     apixaban (ELIQUIS) 5 MG TABS tablet Take 5 mg by mouth 2 (two) times daily.     bisacodyl (DULCOLAX) 10 MG suppository Place 10 mg rectally once as needed for moderate constipation.     cyanocobalamin 100 MCG tablet Take 100 mcg by mouth daily.     darunavir-cobicistat (PREZCOBIX) 800-150 MG tablet TAKE 1 TABLET BY MOUTH DAILY . SWALLOW WHOLE, DO NOT CRUSH, BREAK, OR CHEW TABLETS. TAKE WITH FOOD (Patient taking differently: Take 1 tablet by mouth daily.) 30 tablet 5   Ensure (ENSURE) Take 237 mLs by mouth in the morning and at bedtime.     loperamide (IMODIUM A-D) 2 MG tablet Take 2-4 mg by mouth as needed for diarrhea or loose stools (max dose of 16mg /24 hours). 4mg  after 1st loose stool and 2mg  after each subsequent BM     megestrol (MEGACE ES) 625 MG/5ML suspension Take 5 mLs (625 mg total) by mouth daily. 150 mL 0   oxycodone (OXY-IR) 5 MG capsule Take 5 mg by mouth See admin instructions. Give 5mg  by mouth daily for wound care. May also give 5mg  every 6 hours as needed for chronic pain     pantoprazole (PROTONIX) 20 MG tablet Take 20 mg by mouth daily.     polycarbophil (FIBERCON) 625 MG tablet Take 625 mg by mouth daily.     rilpivirine (EDURANT) 25 MG TABS tablet TAKE 1 TABLET (25 MG TOTAL) BY  MOUTH DAILY WITH BREAKFAST (Patient taking differently: Take 25 mg by mouth daily with breakfast.) 30 tablet 5   simethicone (MYLICON) 80 MG chewable tablet Chew 80 mg by mouth with breakfast, with lunch, and with evening meal.     TIVICAY 50 MG tablet TAKE 1 TABLET (50 MG TOTAL) BY MOUTH DAILY (Patient taking differently: Take 50 mg by mouth daily.) 30 tablet 5   vitamin C (ASCORBIC ACID) 500 MG tablet Take 1,000 mg by mouth daily.      Blood pressure 114/83, pulse 72,  temperature 98.8 F (37.1 C), temperature source Oral, resp. rate 16, SpO2 99 %. Physical Exam: Constitutional: NAD; chronically ill appearing male, laying in bed Eyes: Moist conjunctiva; no lid lag; anicteric; PERRL Neck: Trachea midline; no thyromegaly Lungs: Normal respiratory effort; no tactile fremitus CV: RRR; no palpable thrills; there is bilateral lower extremity edema GI: Abd soft, non-tender; no palpable hepatosplenomegaly GU: sacral wound with pink, moist wound base, small amount fibrinous exudate, no necrosis, no purulence, no bleeding.  There is fibrosis and thickening around the anus. There is skin breakdown of the perianal area R>L. This appears to be chronic. Suspect bleeding was from this area. There is no active bleeding during my exam. DRE was deferred due to pain and recent flex sig. MSK: symmetrical, no clubbing/cyanosis Psychiatric: Appropriate affect; alert and oriented x3 Lymphatic: No palpable cervical or axillary lymphadenopathy  Results for orders placed or performed during the hospital encounter of 12/16/20 (from the past 48 hour(s))  Prepare RBC (crossmatch)     Status: None   Collection Time: 12/18/20  7:05 PM  Result Value Ref Range   Order Confirmation      ORDER PROCESSED BY BLOOD BANK Performed at [facility] [address], [place], [place] [address]   Type and screen [place]     Status: None   Collection Time: 12/18/20  9:47 PM  Result Value  Ref Range   ABO/RH(D) B POS    Antibody Screen NEG    Sample Expiration 12/21/2020,2359    Unit Number [id number]    Blood Component Type RED CELLS,LR    Unit division 00    Status of Unit ISSUED,FINAL    Transfusion Status OK TO TRANSFUSE    Crossmatch Result Compatible    Unit Number [id number]    Blood Component Type RED CELLS,LR    Unit division 00    Status of Unit ISSUED,FINAL    Transfusion Status OK TO TRANSFUSE    Crossmatch Result      Compatible Performed at [facility], [place] [address], [place], [place] [address]   Prepare RBC (crossmatch)     Status: None   Collection Time: 12/19/20  1:00 AM  Result Value Ref Range   Order Confirmation      ORDER PROCESSED BY BLOOD BANK Performed at [facility], [place] [address], [place], [place] [address]   CBC     Status: Abnormal   Collection Time: 12/19/20  8:57 AM  Result Value Ref Range   WBC 9.7 4.0 - 10.5 K/uL   RBC 2.14 (L) 4.22 - 5.81 MIL/uL   Hemoglobin 7.1 (L) 13.0 - 17.0 g/dL   HCT 22.0 (L) 39.0 - 52.0 %   MCV 102.8 (H) 80.0 - 100.0 fL    Comment: POST TRANSFUSION SPECIMEN DELTA CHECK NOTED    MCH 33.2 26.0 - 34.0 pg   MCHC 32.3 30.0 - 36.0 g/dL   RDW 20.7 (H) 11.5 - 15.5 %   Platelets 150 150 - 400 K/uL   nRBC 2.1 (H) 0.0 - 0.2 %    Comment: Performed at [facility],  [address], [place],  [address]  Hemoglobin and hematocrit, blood     Status: Abnormal   Collection Time: 12/19/20  3:29 PM  Result Value Ref Range   Hemoglobin 8.3 (L) 13.0 - 17.0 g/dL   HCT 24.4 (L) 39.0 - 52.0 %    Comment: Performed at [facility], [address] [place], [place] [address]  CBC     Status: Abnormal   Collection Time: 12/20/20  5:11 AM  Result Value Ref Range   WBC 8.0 4.0 - 10.5 K/uL   RBC 2.32 (L) 4.22 - 5.81 MIL/uL   Hemoglobin 7.6 (L) 13.0 - 17.0 g/dL   HCT 23.7 (L) 39.0 - 52.0 %   MCV 102.2 (H) 80.0 - 100.0 fL   MCH  32.8 26.0 - 34.0 pg   MCHC 32.1 30.0 - 36.0 g/dL   RDW 20.1 (H) 11.5 - 15.5 %   Platelets 147 (L) 150 - 400 K/uL    Comment: Immature Platelet Fraction may be clinically indicated, consider ordering this additional test [id number]    nRBC 2.9 (H) 0.0 - 0.2 %    Comment: Performed at [facility], [place] [address], [place], [place] [address]  Basic metabolic panel     Status: Abnormal   Collection Time: 12/20/20  5:11 AM  Result Value Ref Range   Sodium 136 135 - 145 mmol/L   Potassium 3.8 3.5 - 5.1 mmol/L   Chloride 111 98 - 111 mmol/L   CO2 19 (L) 22 - 32 mmol/L   Glucose, Bld 103 (H) 70 - 99 mg/dL    Comment: Glucose reference range applies only to samples taken after fasting for at least 8 hours.   BUN 33 (H) 6 - 20 mg/dL   Creatinine, Ser 1.50 (H) 0.61 - 1.24 mg/dL   Calcium 7.6 (L) 8.9 - 10.3 mg/dL   GFR, Estimated 54 (L) >60 mL/min    Comment: (NOTE) Calculated using the CKD-EPI Creatinine Equation (2021)    Anion gap 6 5 - 15    Comment: Performed at [name], [place] [address], [place], [place] [address]   No results found.   Assessment/Plan Acute blood loss anemia along with chronic anemia - H&H stable after 2u pRBC Sacral wound - no signs of soft tissue infection, no bleeding, continue local wound care  Ulceration/skin breakdown of intergluteal and perianal region - no active bleeding on exam, suspect this is where bleeding was originally coming from. No acute surgical needs. Will consult [place] RN for assistance with wound care management in this area. Ultimately needs good daily hygiene with soap and water along with trying to keep the area dry. Also needs to maintain a good bowel regimen to avoid constipation/diarrhea. CCS will sign off, please re-consult as needed. Thank you.  HIV Functional quadriplegia AKI Urinary retention DVT, recurrent FTT   Jill Alexanders, [facility] Surgery Please see Am[place] for pager  number during day hours 7:00am-4:30pm 12/20/2020, 1:44 PM

## 2020-12-20 NOTE — Progress Notes (Signed)
Received fax from [facility]  Bilateral venous doppler performed 12/14/20 at 1100 shows "Acute DVT is seen bilaterally."   Physical copy in shadow chart  (Of note, Pt was started on Eliquis on 11/30 for suspected DVT)

## 2020-12-21 ENCOUNTER — Inpatient Hospital Stay (HOSPITAL_COMMUNITY): Payer: Medicare Other

## 2020-12-21 ENCOUNTER — Encounter (HOSPITAL_COMMUNITY): Payer: Self-pay | Admitting: Internal Medicine

## 2020-12-21 DIAGNOSIS — D696 Thrombocytopenia, unspecified: Secondary | ICD-10-CM

## 2020-12-21 DIAGNOSIS — N1330 Unspecified hydronephrosis: Secondary | ICD-10-CM

## 2020-12-21 HISTORY — DX: Thrombocytopenia, unspecified: D69.6

## 2020-12-21 LAB — CBC
HCT: 23.7 % — ABNORMAL LOW (ref 39.0–52.0)
Hemoglobin: 7.4 g/dL — ABNORMAL LOW (ref 13.0–17.0)
MCH: 32.7 pg (ref 26.0–34.0)
MCHC: 31.2 g/dL (ref 30.0–36.0)
MCV: 104.9 fL — ABNORMAL HIGH (ref 80.0–100.0)
Platelets: 125 10*3/uL — ABNORMAL LOW (ref 150–400)
RBC: 2.26 MIL/uL — ABNORMAL LOW (ref 4.22–5.81)
RDW: 19.9 % — ABNORMAL HIGH (ref 11.5–15.5)
WBC: 7.4 10*3/uL (ref 4.0–10.5)
nRBC: 2.4 % — ABNORMAL HIGH (ref 0.0–0.2)

## 2020-12-21 MED ORDER — ENOXAPARIN SODIUM 40 MG/0.4ML IJ SOSY
40.0000 mg | PREFILLED_SYRINGE | Freq: Every day | INTRAMUSCULAR | Status: DC
  Administered 2020-12-21 – 2020-12-22 (×2): 40 mg via SUBCUTANEOUS
  Filled 2020-12-21 (×2): qty 0.4

## 2020-12-21 NOTE — Progress Notes (Addendum)
Progress Note [name] [name]   ZDG:[id number]  DOB: 29-Nov-1960  DOA: 12/16/2020     4 Date of Service: 12/21/2020   Clinical Course 60 year old man complicated PMH including HIV, DVT, anal cancer status postchemotherapy and radiation therapy, functional paraplegia, chronic sacral wound with previous osteomyelitis, presenting with rectal bleeding concerning for GI bleed.  Eliquis was held, admitted for further evaluation, seen by gastroenterology, flexible sigmoidoscopy performed.  Per gastroenterology, bleeding thought to be possibly related to wound, not GI in nature, recommended surgical consultation.  Surgery recommended conservative management.  No further bleeding noted.  Starting enoxaparin today, if stable, start apixaban tomorrow and then can pursue discharge Thursday.  Assessment and Plan * Bleeding from wound -- Initially thought to have GI bleed, but was seen by gastroenterology and had flexible sigmoidoscopy, per GI bleeding is thought to be non-GI in nature, probably from wound.  GI recommended consideration of hospice.  Was evaluated in March of this year for rectal bleeding at that time no evidence of recurrence of cancer. Eliquis contributed to bleeding from wound -- General surgery saw as above, no current bleeding, conservative management recommended, plan as above.  ABLA (acute blood loss anemia) -- Thought to be secondary to bleeding from wound.  Hemoglobin up appropriately status post 2 units PRBC 12/11.  Hemoglobin stable today. -- Seen by general surgery, no active bleeding now, recommend conservative management, wound care. --Start enoxaparin.  Check CBC in a.m., if stable, transition to apixaban.    Sacral decubitus ulcer, stage IV (HCC) -- Bleeding from this wound is thought to be the culprit,no evidence of current bleeding.  Did require blood transfusion.   -- Per previous physician, communication with ID recommended no treatment for osteomyelitis at this  time. --Wound care.  Human immunodeficiency virus (HIV) disease ([place]) -- Continue HAART, follow-up with infectious disease as an outpatient.  Thrombocytopenia ([place]) --probably from blood loss, monitor, check CBC in AM  Functional quadriplegia ([place]) -- Possible diagnosis.  PT evaluation recommended no follow-up.  Long-term care.  AKI (acute kidney injury) ([place]) -- Baseline difficult to ascertain but creatinine was 1.25 October 5 this year.  Thought to be prerenal in nature.  Improved with IV fluids.  Check renal ultrasound to assess for resolution of bilateral hydroureteronephrosis seen on CT thought secondary to bladder outlet obstruction.  Acute urinary retention -- Started on Flomax, check renal u/s, if hydro resolved, can proceed with voiding trial 12/14.  Acute thromboembolism of deep veins of lower extremity ([place]) -- Diagnosed early December.  Started on Eliquis presumptively, end of November.  Eliquis currently on hold given significant bleeding, starting Lovenox, if no further bleeding, apixaban the following day.  FTT (failure to thrive) in adult -- Appreciate palliative care. Subjective:  Feels fine today, no complaints, no bleeding per nursing and patient  Objective Vitals:   12/20/20 1439 12/20/20 1955 12/21/20 0445 12/21/20 1456  BP: 112/70 127/79 114/80 101/73  Pulse: 72 83 66 75  Resp: 20 18 16 17   Temp: 97.9 F (36.6 C) 99.2 F (37.3 C) 98.6 F (37 C) 99.2 F (37.3 C)  TempSrc: Oral Oral Oral   SpO2: 100% 98% 100% 99%     Vital signs were reviewed and unremarkable.  Exam Physical Exam Vitals reviewed.  Constitutional:      General: He is not in acute distress.    Appearance: He is not ill-appearing.  Cardiovascular:     Rate and Rhythm: Normal rate and regular rhythm.     Heart  sounds: No murmur heard. Pulmonary:     Effort: Pulmonary effort is normal. No respiratory distress.     Breath sounds: No wheezing or rales.  Psychiatric:        Mood and  Affect: Mood normal.        Behavior: Behavior normal.    Labs / Other Information Hgb stable 7.4 Plts 125   Disposition Plan: Status is: Inpatient  Remains inpatient appropriate because: blood loss anemia, need to restart anticoagulation in controlled setting  enoxaparin  Time spent: 25 minutes [facility] 12/21/2020, 4:46 PM

## 2020-12-21 NOTE — Assessment & Plan Note (Signed)
--  probably from blood loss, monitor, check CBC in AM

## 2020-12-22 DIAGNOSIS — R338 Other retention of urine: Secondary | ICD-10-CM

## 2020-12-22 LAB — BASIC METABOLIC PANEL
Anion gap: 7 (ref 5–15)
BUN: 37 mg/dL — ABNORMAL HIGH (ref 6–20)
CO2: 19 mmol/L — ABNORMAL LOW (ref 22–32)
Calcium: 7.8 mg/dL — ABNORMAL LOW (ref 8.9–10.3)
Chloride: 110 mmol/L (ref 98–111)
Creatinine, Ser: 1.24 mg/dL (ref 0.61–1.24)
GFR, Estimated: >60 mL/min (ref >60)
Glucose, Bld: 94 mg/dL (ref 70–99)
Potassium: 4.2 mmol/L (ref 3.5–5.1)
Sodium: 136 mmol/L (ref 135–145)

## 2020-12-22 LAB — CBC
HCT: 23.6 % — ABNORMAL LOW (ref 39.0–52.0)
Hemoglobin: 7.5 g/dL — ABNORMAL LOW (ref 13.0–17.0)
MCH: 33.9 pg (ref 26.0–34.0)
MCHC: 31.8 g/dL (ref 30.0–36.0)
MCV: 106.8 fL — ABNORMAL HIGH (ref 80.0–100.0)
Platelets: 129 10*3/uL — ABNORMAL LOW (ref 150–400)
RBC: 2.21 MIL/uL — ABNORMAL LOW (ref 4.22–5.81)
RDW: 19.9 % — ABNORMAL HIGH (ref 11.5–15.5)
WBC: 7.7 10*3/uL (ref 4.0–10.5)
nRBC: 2.3 % — ABNORMAL HIGH (ref 0.0–0.2)

## 2020-12-22 MED ORDER — APIXABAN 2.5 MG PO TABS
2.5000 mg | ORAL_TABLET | Freq: Two times a day (BID) | ORAL | Status: DC
  Administered 2020-12-22 – 2020-12-23 (×2): 2.5 mg via ORAL
  Filled 2020-12-22 (×2): qty 1

## 2020-12-22 NOTE — Progress Notes (Signed)
PROGRESS NOTE    [name] [name]  ZOX:[id number] DOB: 01/14/1960 DOA: 12/16/2020 PCP: [name], MD   Brief Narrative: Patient is a chronically ill-appearing 60 year old Caucasian male with past medical history significant for but not limited to HIV, history of DVT, history of anal cancer status postchemotherapy and radiation therapy, history of functional paraplegia, chronic sacral wound with previous osteomyelitis, as well as other comorbidities who presented with rectal bleeding concern for GI bleed.  Initially Eliquis was held and he was admitted for further evaluation.  He was seen by GI who did a flexible sigmoidoscopy and per GI the bleeding was thought to be related to the wound and not GI in nature so they recommended surgical consultation.  Surgery evaluated and recommended conservative management.  Patient has had no further bleeding noted he is started on enoxaparin yesterday.  Given that his hemoglobin has been stable we will start apixaban today but reduce the dose given his HIV medications.  Anticipating discharging back to his facility in the next 24 to 48 hours if he has no further significant bleeding and if his hemoglobin stays stable.   Assessment & Plan:   Principal Problem:   Bleeding from wound Active Problems:   Human immunodeficiency virus (HIV) disease ([place])   Acute thromboembolism of deep veins of lower extremity (HCC)   Sacral decubitus ulcer, stage IV (HCC)   History of anal cancer   AKI (acute kidney injury) ([place])   Sacral osteomyelitis (HCC)   Functional quadriplegia (HCC)   ABLA (acute blood loss anemia)   Acute urinary retention   FTT (failure to thrive) in adult   Thrombocytopenia (HCC)  Bleeding from wound -Initially thought to have GI bleed, but was seen by gastroenterology and had flexible sigmoidoscopy, per GI bleeding is thought to be non-GI in nature, probably from wound.  GI recommended consideration of hospice.  Was evaluated in March of  this year for rectal bleeding at that time no evidence of recurrence of cancer. Eliquis contributed to bleeding from wound and had been held but is now being resumed today given that he tolerated his Lovenox yesterday -General surgery saw as above, no current bleeding, conservative management recommended, plan as above.   ABLA (acute blood loss anemia) superimposed on chronic macrocytic anemia -Thought to be secondary to bleeding from wound.  Hemoglobin up appropriately status post 2 units PRBC 12/11.  Hemoglobin stable today. -Seen by general surgery, no active bleeding now, recommend conservative management, wound care. -Started enoxaparin and has now been transitioned to p.o. anticoagulant with apixaban but will dose adjust given that he is on HIV medications. Now on Apixaban 2.5 mg po BID  -Patient's hemoglobin/hematocrit has been relatively stable and has gone from 6.0/19.6 -> 7.1/22.0 -> 8.3/24.4 -> 7.6/23.7 -> 7.4/23.7 -> 7.5/23.6 -Continue to monitor for signs and symptoms of bleeding; currently no overt bleeding noted   Sacral decubitus ulcer, stage IV (HCC) -Bleeding from this wound is thought to be the culprit,no evidence of current bleeding.  Did require blood transfusion.   -Per previous physician, communication with ID recommended no treatment for osteomyelitis at this time. -Wound care.   Human immunodeficiency virus (HIV) disease ([place]) -Continue HAART with darunavir-cobicistat 1 tab p.o. daily and Dolutegravir 50 mg po Daily and Rilpivirine 25 mg po Daily , follow-up with infectious disease as an outpatient. -Continue with SMX TMP   Thrombocytopenia (HCC) -Probably from blood loss -Count trended down to 125 and is now stable and trending back up to 129 -  Continue to monitor for further signs and symptoms of bleeding -Repeat CBC in a.m.   Functional quadriplegia (HCC) -Possible diagnosis.  PT evaluation recommended no follow-up. -He is a Long-term care.   AKI (acute kidney  injury) ([place]) Metabolic acidosis -Baseline difficult to ascertain but creatinine was 1.25 October 5 this year.   -Thought to be prerenal in nature.   -Improved with IV fluids./Creatinine stable at 37/1.24 -Patient has a mild metabolic acidosis with a CO2 of 19, anion gap is 7, chloride level of 110 -Check renal ultrasound to assess for resolution of bilateral hydroureteronephrosis seen on CT thought secondary to bladder outlet obstruction and now his hydronephrosis is improved -We will continue to monitor renal function carefully and avoid nephrotoxic medications, contrast dyes, hypotension and renally adjust medications -Repeat CMP in a.m.   Acute urinary retention -Started on Flomax,  -Checked renal u/s and since hydro resolved, can proceed with voiding trial 12/14 and have ordered Foley catheter to be discontinued   Acute thromboembolism of deep veins of lower extremity ([place]) -Diagnosed early December.   -Started on Eliquis presumptively, end of November.   -Eliquis was on hold given significant bleeding, but since he tolerated Lovenox yesterday with no further bleeding we have reinitiated apixaban but dose adjusted   FTT (failure to thrive) in adult -Appreciate palliative care.  DVT prophylaxis: Anticoagulated with apixaban Code Status: DO NOT RESUSCITATE  Family Communication: No family currently at bedside Disposition Plan: Anticipating discharging back to his SNF in the next 24 to 48 hours  Status is: Inpatient  Remains inpatient appropriate because: We will need to ensure the patient is not bleeding and has a stable hemoglobin prior to safe discharge disposition  Consultants:  Discussed with ID Gastroenterology Palliative care  Procedures:  Flex sigmoidoscopy 12/17/2020 Findings:      The perianal exam findings include blood clots, skin breakdown and       induration.      The digital rectal exam findings include decreased tone with indurated       and open canal.       Theree were clots of blood in anal canal which is indurated and       ulcerated. There is ulceration at the anorectum with bleeding in some       areas. Distortion of anatomy. The rectum and very distal sigmoid have       irregular fdriable mucosa with minimal contact bleeding. There is stool       on proximal rectum and distal sigmoid but no good evidence for bleeding       from there - though exam very limited. Impression:               - Blood clots, skin breakdown and induration found                            on perianal exam.                           - Decreased tone with indurated and open canal                            found on digital rectal exam.                           Ulcerated and distorted  anal and perianal area with                            bleeding. ? from osteomyelitis or other cause like                            pressure injury. Nothing amenable to endoscopic                            treatment. I do not think there is more proximal                            bleeding source.                           - No specimens collected.  Antimicrobials:  Anti-infectives (From admission, onward)    Start     Dose/Rate Route Frequency Ordered Stop   12/18/20 2200  sulfamethoxazole-trimethoprim (BACTRIM DS) 800-160 MG per tablet 1 tablet  Status:  Discontinued        1 tablet Oral Every 12 hours 12/18/20 1751 12/20/20 1140   12/17/20 1000  darunavir-cobicistat (PREZCOBIX) 800-150 MG per tablet 1 tablet        1 tablet Oral Daily 12/17/20 0106     12/17/20 1000  dolutegravir (TIVICAY) tablet 50 mg        50 mg Oral Daily 12/17/20 0106     12/17/20 0800  rilpivirine (EDURANT) tablet 25 mg        25 mg Oral Daily with breakfast 12/17/20 0106          Subjective: Seen and examined at bedside he is doing okay.  Had no further bleeding noted.  No nausea or vomiting.  Had some pain but was given some morphine.  Denies any lightheadedness or dizziness.  No other concerns  or complaints at this time.  Objective: Vitals:   12/21/20 1456 12/21/20 2007 12/22/20 0550 12/22/20 1449  BP: 101/73 122/79 121/81 105/71  Pulse: 75 76 61 82  Resp: 17 18 16 16   Temp: 99.2 F (37.3 C) 98.8 F (37.1 C) 99.1 F (37.3 C)   TempSrc:  Oral Oral   SpO2: 99% 100% 100% 100%    Intake/Output Summary (Last 24 hours) at 12/22/2020 1548 Last data filed at 12/22/2020 1008 Gross per 24 hour  Intake 1090 ml  Output --  Net 1090 ml   Filed Weights   Examination: Physical Exam:  Constitutional: Thin chronically ill-appearing Caucasian male currently in no acute distress appears calm but slightly uncomfortable Eyes: Lids and conjunctivae normal, sclerae anicteric  ENMT: External Ears, Nose appear normal. Grossly normal hearing. Mucous membranes are moist.  Neck: Appears normal, supple, no cervical masses, normal ROM, no appreciable thyromegaly no appreciable JVD Respiratory: Diminished to auscultation bilaterally, no wheezing, rales, rhonchi or crackles. Normal respiratory effort and patient is not tachypenic. No accessory muscle use.  Unlabored breathing Cardiovascular: RRR, no murmurs / rubs / gallops. S1 and S2 auscultated.  No appreciable extremity edema Abdomen: Soft, non-tender, non-distended. Bowel sounds positive.  GU: Deferred. Musculoskeletal: No clubbing / cyanosis of digits/nails. No joint deformity upper and lower extremities. Skin: Has a large sacral decubitus ulcer noted that is packed Neurologic: CN 2-12 grossly intact with no focal deficits.  Romberg sign cerebellar  reflexes not assessed.  Psychiatric: Slightly impaired judgment and insight. Alert and oriented x 2. Normal mood and appropriate affect.   Data Reviewed: I have personally reviewed following labs and imaging studies  CBC: Recent Labs  Lab 12/16/20 1644 12/16/20 1654 12/18/20 0543 12/19/20 0857 12/19/20 1529 12/20/20 0511 12/21/20 0520 12/22/20 0504  WBC 9.9   < > 9.7 9.7  --  8.0 7.4  7.7  NEUTROABS 7.2  --   --   --   --   --   --   --   HGB 8.2*   < > 6.0* 7.1* 8.3* 7.6* 7.4* 7.5*  HCT 26.3*   < > 19.6* 22.0* 24.4* 23.7* 23.7* 23.6*  MCV 110.5*   < > 111.4* 102.8*  --  102.2* 104.9* 106.8*  PLT 226   < > 181 150  --  147* 125* 129*   < > = values in this interval not displayed.   Basic Metabolic Panel: Recent Labs  Lab 12/17/20 0605 12/17/20 1200 12/18/20 0543 12/20/20 0511 12/22/20 0504  NA 136 140 140 136 136  K 3.7 4.3 4.3 3.8 4.2  CL 112* 114* 116* 111 110  CO2 16* 18* 20* 19* 19*  GLUCOSE 105* 88 100* 103* 94  BUN 71* 63* 54* 33* 37*  CREATININE 1.89* 1.78* 1.65* 1.50* 1.24  CALCIUM 8.3* 8.8* 8.5* 7.6* 7.8*   GFR: CrCl cannot be calculated (Unknown ideal weight.). Liver Function Tests: Recent Labs  Lab 12/16/20 1644  AST 22  ALT 25  ALKPHOS 36*  BILITOT 0.4  PROT 5.9*  ALBUMIN 2.8*   No results for input(s): LIPASE, AMYLASE in the last 168 hours. No results for input(s): AMMONIA in the last 168 hours. Coagulation Profile: No results for input(s): INR, PROTIME in the last 168 hours. Cardiac Enzymes: No results for input(s): CKTOTAL, CKMB, CKMBINDEX, TROPONINI in the last 168 hours. BNP (last 3 results) No results for input(s): PROBNP in the last 8760 hours. HbA1C: No results for input(s): HGBA1C in the last 72 hours. CBG: No results for input(s): GLUCAP in the last 168 hours. Lipid Profile: No results for input(s): CHOL, HDL, LDLCALC, TRIG, CHOLHDL, LDLDIRECT in the last 72 hours. Thyroid Function Tests: No results for input(s): TSH, T4TOTAL, FREET4, T3FREE, THYROIDAB in the last 72 hours. Anemia Panel: No results for input(s): VITAMINB12, FOLATE, FERRITIN, TIBC, IRON, RETICCTPCT in the last 72 hours. Sepsis Labs: Recent Labs  Lab 12/16/20 2057 12/16/20 2250 12/17/20 1200 12/17/20 1720  LATICACIDVEN 2.4* 2.4* 2.5* 1.8    Recent Results (from the past 240 hour(s))  Resp Panel by RT-PCR (Flu A&B, Covid) Nasopharyngeal Swab      Status: None   Collection Time: 12/17/20 12:17 AM   Specimen: Nasopharyngeal Swab; Nasopharyngeal(NP) swabs in vial transport medium  Result Value Ref Range Status   SARS Coronavirus 2 by RT PCR NEGATIVE NEGATIVE Final    Comment: (NOTE) SARS-CoV-2 target nucleic acids are NOT DETECTED.  The SARS-CoV-2 RNA is generally detectable in upper respiratory specimens during the acute phase of infection. The lowest concentration of SARS-CoV-2 viral copies this assay can detect is 138 copies/mL. A negative result does not preclude SARS-Cov-2 infection and should not be used as the sole basis for treatment or other patient management decisions. A negative result may occur with  improper specimen collection/handling, submission of specimen other than nasopharyngeal swab, presence of viral mutation(s) within the areas targeted by this assay, and inadequate number of viral copies(<138 copies/mL). A negative result must  be combined with clinical observations, patient history, and epidemiological information. The expected result is Negative.  Fact Sheet for Patients:  EntrepreneurPulse.com.au  Fact Sheet for Healthcare Providers:  IncredibleEmployment.be  This test is no t yet approved or cleared by the [place] FDA and  has been authorized for detection and/or diagnosis of SARS-CoV-2 by FDA under an Emergency Use Authorization (EUA). This EUA will remain  in effect (meaning this test can be used) for the duration of the COVID-19 declaration under Section 564(b)(1) of the Act, 21 U.S.C.section 360bbb-3(b)(1), unless the authorization is terminated  or revoked sooner.       Influenza A by PCR NEGATIVE NEGATIVE Final   Influenza B by PCR NEGATIVE NEGATIVE Final    Comment: (NOTE) The Xpert Xpress SARS-CoV-2/FLU/RSV plus assay is intended as an aid in the diagnosis of influenza from Nasopharyngeal swab specimens and should not be used as a sole basis  for treatment. Nasal washings and aspirates are unacceptable for Xpert Xpress SARS-CoV-2/FLU/RSV testing.  Fact Sheet for Patients: EntrepreneurPulse.com.au  Fact Sheet for Healthcare Providers: IncredibleEmployment.be  This test is not yet approved or cleared by the [place] FDA and has been authorized for detection and/or diagnosis of SARS-CoV-2 by FDA under an Emergency Use Authorization (EUA). This EUA will remain in effect (meaning this test can be used) for the duration of the COVID-19 declaration under Section 564(b)(1) of the Act, 21 U.S.C. section 360bbb-3(b)(1), unless the authorization is terminated or revoked.  Performed at [facility], [place] [address] [place], [place] [address]   MRSA Next Gen by PCR, Nasal     Status: Abnormal   Collection Time: 12/18/20  6:51 AM   Specimen: Nasal Mucosa; Nasal Swab  Result Value Ref Range Status   MRSA by PCR Next Gen DETECTED (A) NOT DETECTED Final    Comment: RESULT CALLED TO, READ BACK BY AND VERIFIED WITH: JOE RN ON 12/18/20 AT 1149 BY GOLSONM Performed at [facility] [address], [place], [place] [address]     RN Pressure Injury Documentation: Pressure Injury 05/13/20 Sacrum Mid Stage 3 -  Full thickness tissue loss. Subcutaneous fat may be visible but bone, tendon or muscle are NOT exposed. Was [place] PI, now healing Stage 3 because was previously Stage 3 (Active)  05/13/20   Location: Sacrum  Location Orientation: Mid  Staging: Stage 3 -  Full thickness tissue loss. Subcutaneous fat may be visible but bone, tendon or muscle are NOT exposed.  Wound Description (Comments): Was [place] PI, now healing Stage 3 because was previously Stage 3  Present on Admission: Yes     Pressure Injury 10/09/20 Heel Right Stage 2 -  Partial thickness loss of dermis presenting as a shallow open injury with a red, pink wound bed without slough. (Active)  10/09/20 2300   Location: Heel  Location Orientation: Right  Staging: Stage 2 -  Partial thickness loss of dermis presenting as a shallow open injury with a red, pink wound bed without slough.  Wound Description (Comments):   Present on Admission: Yes     Pressure Injury 12/17/20 Sacrum Mid Stage 4 - Full thickness tissue loss with exposed bone, tendon or muscle. (Active)  12/17/20   Location: Sacrum  Location Orientation: Mid  Staging: Stage 4 - Full thickness tissue loss with exposed bone, tendon or muscle.  Wound Description (Comments):   Present on Admission: Yes   Estimated body mass index is 16.02 kg/m as calculated from the following:  Height as of 10/09/20: 6\' 2"  (1.88 m).   Weight as of 10/09/20: 56.6 kg.  Malnutrition Type:  Nutrition Problem: Increased nutrient needs Etiology: acute illness, wound healing  Malnutrition Characteristics:  Signs/Symptoms: estimated needs  Nutrition Interventions:  Interventions: Ensure Enlive (each supplement provides 350kcal and 20 grams of protein), Prostat, Juven   Radiology Studies: US RENAL  Result Date: 12/21/2020 CLINICAL DATA:  Hydronephrosis EXAM: RENAL / URINARY TRACT ULTRASOUND COMPLETE COMPARISON:  CT 12/16/2020 FINDINGS: Right Kidney: Renal measurements: 10.1 x 5 x 5.6 cm = volume: 149.6 mL. Echogenicity within normal limits. No mass or hydronephrosis visualized. Left Kidney: Renal measurements: 9.4 x 5.2 x 5.2 cm = volume: 133 mL. Echogenicity within normal limits. No mass or hydronephrosis visualized. Bladder: Not visualized and likely empty. Other: None. IMPRESSION: Negative for hydronephrosis on today's study. Electronically Signed   By: Donavan Foil M.D.   On: 12/21/2020 18:52    Scheduled Meds:  (feeding supplement) PROSource Plus  30 mL Oral BID BM   sodium chloride   Intravenous Once   apixaban  2.5 mg Oral BID   vitamin C  500 mg Oral BID   Chlorhexidine Gluconate Cloth  6 each Topical Daily   darunavir-cobicistat  1 tablet  Oral Daily   dolutegravir  50 mg Oral Daily   famotidine  20 mg Oral Daily   feeding supplement  237 mL Oral Q24H   mupirocin ointment   Nasal BID   nutrition supplement (JUVEN)  1 packet Oral BID BM   polycarbophil  625 mg Oral Daily   rilpivirine  25 mg Oral Q breakfast   cyanocobalamin  100 mcg Oral Daily   Zinc Oxide   Topical TID   zinc sulfate  220 mg Oral Daily   Continuous Infusions:   LOS: 5 days   Kerney Elbe, DO [facility] PAGER is on AMION  If 7PM-7AM, please contact night-coverage [email or web address]

## 2020-12-22 NOTE — Progress Notes (Signed)
Physical Therapy Discharge Patient Details Name: [name] [name] MRN: [id number] DOB: 1960/03/13 Today's Date: 12/22/2020 Time:  -     Patient discharged from PT services secondary to patient has refused 3 (three) consecutive times without medical reason and patient comes from LTC SNF. Patient is confused . Based on patient's  significant LE contractures and wounds, appears patient bed bound although patient reports that he walks. PT can reassess at facility when he returns. .  Please see latest therapy progress note for current level of functioning and progress toward goals.    Progress and discharge plan discussed with patient and/or caregiver: Patient unable to participate in discharge planning and no caregivers available  GP     [name] 12/22/2020, 2:09 PM

## 2020-12-22 NOTE — Plan of Care (Signed)

## 2020-12-23 ENCOUNTER — Telehealth: Payer: Self-pay

## 2020-12-23 LAB — CBC WITH DIFFERENTIAL/PLATELET
Abs Immature Granulocytes: 0.34 10*3/uL — ABNORMAL HIGH (ref 0.00–0.07)
Basophils Absolute: 0 10*3/uL (ref 0.0–0.1)
Basophils Relative: 0 %
Eosinophils Absolute: 0.1 10*3/uL (ref 0.0–0.5)
Eosinophils Relative: 1 %
HCT: 23.8 % — ABNORMAL LOW (ref 39.0–52.0)
Hemoglobin: 7.8 g/dL — ABNORMAL LOW (ref 13.0–17.0)
Immature Granulocytes: 5 %
Lymphocytes Relative: 20 %
Lymphs Abs: 1.5 10*3/uL (ref 0.7–4.0)
MCH: 33.8 pg (ref 26.0–34.0)
MCHC: 32.8 g/dL (ref 30.0–36.0)
MCV: 103 fL — ABNORMAL HIGH (ref 80.0–100.0)
Monocytes Absolute: 0.4 10*3/uL (ref 0.1–1.0)
Monocytes Relative: 6 %
Neutro Abs: 5.2 10*3/uL (ref 1.7–7.7)
Neutrophils Relative %: 68 %
Platelets: 142 10*3/uL — ABNORMAL LOW (ref 150–400)
RBC: 2.31 MIL/uL — ABNORMAL LOW (ref 4.22–5.81)
RDW: 19.8 % — ABNORMAL HIGH (ref 11.5–15.5)
WBC: 7.5 10*3/uL (ref 4.0–10.5)
nRBC: 2.4 % — ABNORMAL HIGH (ref 0.0–0.2)

## 2020-12-23 LAB — COMPREHENSIVE METABOLIC PANEL
ALT: 20 U/L (ref 0–44)
AST: 21 U/L (ref 15–41)
Albumin: 2.3 g/dL — ABNORMAL LOW (ref 3.5–5.0)
Alkaline Phosphatase: 30 U/L — ABNORMAL LOW (ref 38–126)
Anion gap: 7 (ref 5–15)
BUN: 42 mg/dL — ABNORMAL HIGH (ref 6–20)
CO2: 18 mmol/L — ABNORMAL LOW (ref 22–32)
Calcium: 7.9 mg/dL — ABNORMAL LOW (ref 8.9–10.3)
Chloride: 112 mmol/L — ABNORMAL HIGH (ref 98–111)
Creatinine, Ser: 1.38 mg/dL — ABNORMAL HIGH (ref 0.61–1.24)
GFR, Estimated: 59 mL/min — ABNORMAL LOW (ref >60)
Glucose, Bld: 97 mg/dL (ref 70–99)
Potassium: 4 mmol/L (ref 3.5–5.1)
Sodium: 137 mmol/L (ref 135–145)
Total Bilirubin: 0.6 mg/dL (ref 0.3–1.2)
Total Protein: 4.8 g/dL — ABNORMAL LOW (ref 6.5–8.1)

## 2020-12-23 LAB — RESP PANEL BY RT-PCR (FLU A&B, COVID) ARPGX2
Influenza A by PCR: NEGATIVE
Influenza B by PCR: NEGATIVE
SARS Coronavirus 2 by RT PCR: NEGATIVE

## 2020-12-23 LAB — PHOSPHORUS: Phosphorus: 2.3 mg/dL — ABNORMAL LOW (ref 2.5–4.6)

## 2020-12-23 LAB — MAGNESIUM: Magnesium: 1.5 mg/dL — ABNORMAL LOW (ref 1.7–2.4)

## 2020-12-23 MED ORDER — ONDANSETRON HCL 4 MG PO TABS: 4.0000 mg | ORAL_TABLET | Freq: Four times a day (QID) | ORAL | 0 refills | Status: DC | PRN

## 2020-12-23 MED ORDER — FAMOTIDINE 20 MG PO TABS: 20.0000 mg | ORAL_TABLET | Freq: Every day | ORAL | 0 refills | Status: DC

## 2020-12-23 MED ORDER — OXYCODONE HCL 5 MG PO CAPS: 5.0000 mg | ORAL_CAPSULE | ORAL | 0 refills | Status: DC

## 2020-12-23 MED ORDER — MAGNESIUM SULFATE 2 GM/50ML IV SOLN
2.0000 g | Freq: Once | INTRAVENOUS | Status: AC
  Administered 2020-12-23: 2 g via INTRAVENOUS
  Filled 2020-12-23: qty 50

## 2020-12-23 MED ORDER — JUVEN PO PACK: 1.0000 | PACK | Freq: Two times a day (BID) | ORAL | 0 refills | Status: DC

## 2020-12-23 MED ORDER — ZINC OXIDE 12.8 % EX OINT: TOPICAL_OINTMENT | Freq: Three times a day (TID) | CUTANEOUS | 0 refills | Status: DC

## 2020-12-23 MED ORDER — PROSOURCE PLUS PO LIQD: 30.0000 mL | Freq: Two times a day (BID) | ORAL | Status: DC

## 2020-12-23 MED ORDER — ZINC SULFATE 220 (50 ZN) MG PO CAPS: 220.0000 mg | ORAL_CAPSULE | Freq: Every day | ORAL | Status: DC

## 2020-12-23 MED ORDER — APIXABAN 2.5 MG PO TABS: 2.5000 mg | ORAL_TABLET | Freq: Two times a day (BID) | ORAL | 0 refills | Status: DC

## 2020-12-23 MED ORDER — SODIUM BICARBONATE 650 MG PO TABS
650.0000 mg | ORAL_TABLET | Freq: Three times a day (TID) | ORAL | Status: DC
  Administered 2020-12-23: 650 mg via ORAL
  Filled 2020-12-23: qty 1

## 2020-12-23 MED ORDER — ACETAMINOPHEN 325 MG PO TABS: 650.0000 mg | ORAL_TABLET | Freq: Four times a day (QID) | ORAL | Status: DC | PRN

## 2020-12-23 MED ORDER — ENSURE ENLIVE PO LIQD: 237.0000 mL | ORAL | 12 refills | Status: DC

## 2020-12-23 MED ORDER — K PHOS MONO-SOD PHOS DI & MONO 155-852-130 MG PO TABS
500.0000 mg | ORAL_TABLET | Freq: Once | ORAL | Status: AC
  Administered 2020-12-23: 500 mg via ORAL
  Filled 2020-12-23: qty 2

## 2020-12-23 MED ORDER — SODIUM BICARBONATE 650 MG PO TABS: 650.0000 mg | ORAL_TABLET | Freq: Three times a day (TID) | ORAL | 0 refills | Status: AC

## 2020-12-23 NOTE — Consult Note (Addendum)
Urology Consult   Physician requesting consult: Kerney Elbe MD  Reason for consult: Urinary Retention   History of Present Illness: [name] [name] is a 60 y.o. with history of HIV, DVT, anal cancer s/p chemotherapy and radiation, functional paraplegia, chronic sacral ulcers who is admitted for rectal bleeding. He was found to be in urinary retention on admission with 700cc on bladder scan so foley was placed in the ED. He was started on flomax and underwent a voiding trial today. He has been unable to void so a catheter was attempted to replaced. Nursing was unable to place the catheter so urology was consulted.   Patient appears to have history of bulbar urethral stricture requiring dilation in 2016. He is a very poor historian but states that he has not had issues voiding since that that. Based on chart review, he also required foley placement on prior admission in 06/2020 but did not follow up with urology.    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   Shingles    left shoulder, right leg    Past Surgical History:  Procedure Laterality Date   COLONOSCOPY     CONVERSION TO TOTAL HIP Right 06/15/2014   Procedure: REMOVAL OF ANTIBIOTIC  SPACER AND CONVERSION TO RIGHT TOTAL HIP  ARTHROPLASTY ;  Surgeon: Paralee Cancel, MD;  Location: [facility];  Service: Orthopedics;  Laterality: Right;   CYSTOSCOPY     FLEXIBLE SIGMOIDOSCOPY N/A 12/17/2020   Procedure: FLEXIBLE  SIGMOIDOSCOPY;  Surgeon: Gatha Mayer, MD;  Location: [facility];  Service: Endoscopy;  Laterality: N/A;   HEMATOMA EVACUATION     HERNIA REPAIR     right X2   INCISION AND DRAINAGE HIP Right 07/31/2013   Procedure: IRRIGATION AND DEBRIDEMENT HIP WITH PERCUTANEOUS DRAIN PLACEMENT;  Surgeon: [name], MD;  Location: [place];  Service: Orthopedics;  Laterality: Right;   INCISION AND DRAINAGE HIP Right 03/15/2015   Procedure: IRRIGATION AND DEBRIDEMENT RIGHT HIP WITH  HEAD BALL  AND POLY LINER EXCHANGE;  Surgeon: Paralee Cancel, MD;  [facility]: [facility];  Service: Orthopedics;  Laterality: Right;   INCISION AND DRAINAGE OF WOUND N/A 08/25/2013   Procedure: IRRIGATION AND DEBRIDEMENT OF SACRAL ULCER WITH PLACEMENT OF A CELL ;  Surgeon: Theodoro Kos, DO;  Location: [place];  Service: Plastics;  Laterality: N/A;   INCISION AND DRAINAGE OF WOUND N/A 09/01/2013   Procedure: IRRIGATION AND DEBRIDEMENT SACRAL ULCER WITH PLACEMENT OF A CELL;  Surgeon: Theodoro Kos, DO;  Location: [place];  Service: Plastics;  Laterality: N/A;   INCISION AND DRAINAGE OF WOUND N/A 09/08/2013   Procedure: IRRIGATION AND DEBRIDEMENT OF SACRAL ULCER WITH PLACEMENT OF A CELL AND VAC;  Surgeon: Theodoro Kos, DO;  Location: [place];  Service: Plastics;  Laterality: N/A;   INCISION AND DRAINAGE OF WOUND N/A 09/18/2013   Procedure: IRRIGATION AND DEBRIDEMENT OF SACRAL WOUND WITH PLACEMENT OF A-CELL;  Surgeon: Theodoro Kos, DO;  Location: [place];  Service: Plastics;  Laterality: N/A;   PILONIDAL CYST DRAINAGE N/A 08/09/2013   Procedure: IRRIGATION AND DEBRIDEMENT SACRAL DECUBITUS;  Surgeon: Harl Bowie, MD;  Location: [place];  Service: General;  Laterality: N/A;   RECTAL BIOPSY N/A 03/29/2020   Procedure: EXCISION OF PERIANAL TISSUE WITH BX AND 3 PUNCH BXS;  Surgeon: Ileana Roup, MD;  [facility];  Service: General;  Laterality: N/A;   TOTAL HIP ARTHROPLASTY Right 08/04/2013   Procedure: RESECTION HIP JOINT - PLACEMENT OF  CEMENT PROSTHESIS;  Surgeon: [name], MD;  Location: [place];  Service: Orthopedics;  Laterality: Right;   TRANSURETHRAL RESECTION OF PROSTATE  JAN 2016    Current Hospital Medications:  Home Meds:  No current facility-administered medications on file prior to encounter.   Current Outpatient Medications on File Prior to Encounter  Medication Sig Dispense Refill   Amino Acids-Protein Hydrolys (FEEDING SUPPLEMENT, PRO-STAT SUGAR FREE 64,) LIQD Take 30 mLs by mouth 3 (three) times daily.     apixaban (ELIQUIS) 5 MG TABS tablet Take 5 mg by mouth 2 (two) times daily.     bisacodyl (DULCOLAX) 10 MG suppository Place 10 mg rectally once as needed for moderate constipation.     cyanocobalamin 100 MCG tablet Take 100 mcg by mouth daily.     darunavir-cobicistat (PREZCOBIX) 800-150 MG tablet TAKE 1 TABLET BY MOUTH DAILY . SWALLOW WHOLE, DO NOT CRUSH, BREAK, OR CHEW TABLETS. TAKE WITH FOOD (Patient taking differently: Take 1 tablet by mouth daily.) 30 tablet 5   Ensure (ENSURE) Take 237 mLs by mouth in the morning and at bedtime.     loperamide (IMODIUM A-D) 2 MG tablet Take 2-4 mg by mouth as needed for diarrhea or loose stools (max dose of 16mg /24 hours). 4mg  after 1st loose stool and 2mg  after each subsequent BM     megestrol (MEGACE ES) 625 MG/5ML suspension Take 5 mLs (625 mg total) by mouth daily. 150 mL 0   pantoprazole (PROTONIX) 20 MG tablet Take 20 mg by mouth daily.     polycarbophil (FIBERCON) 625 MG tablet Take 625 mg by mouth daily.     rilpivirine (EDURANT) 25 MG TABS tablet TAKE 1 TABLET (25 MG TOTAL) BY MOUTH DAILY WITH BREAKFAST (Patient taking differently: Take 25 mg by mouth daily with breakfast.) 30 tablet 5   simethicone (MYLICON) 80 MG chewable tablet Chew 80 mg by mouth with breakfast, with lunch, and with evening meal.     TIVICAY 50 MG tablet TAKE 1 TABLET (50 MG TOTAL) BY MOUTH DAILY (Patient taking differently: Take 50 mg by mouth daily.) 30 tablet 5   vitamin C  (ASCORBIC ACID) 500 MG tablet Take 1,000 mg by mouth daily.       Scheduled Meds:  (feeding supplement) PROSource Plus  30 mL Oral BID BM   sodium chloride   Intravenous Once   apixaban  2.5 mg Oral BID   vitamin C  500 mg Oral BID   Chlorhexidine Gluconate Cloth  6 each Topical Daily   darunavir-cobicistat  1 tablet Oral Daily   dolutegravir  50 mg Oral Daily   famotidine  20 mg Oral Daily   feeding supplement  237 mL Oral Q24H   mupirocin ointment   Nasal BID   nutrition supplement (JUVEN)  1 packet Oral BID BM   polycarbophil  625 mg Oral Daily   rilpivirine  25 mg Oral Q breakfast   sodium bicarbonate  650 mg Oral TID   cyanocobalamin  100 mcg Oral Daily   Zinc Oxide   Topical TID   zinc sulfate  220 mg Oral Daily   Continuous Infusions: PRN Meds:.acetaminophen **OR** acetaminophen, HYDROmorphone (DILAUDID) injection, ondansetron **OR** ondansetron (ZOFRAN) IV, oxyCODONE  Allergies:  Allergies  Allergen Reactions   Collagen Rash    redness    Family History  Problem Relation Age of Onset   Diabetes Mother    Dementia Mother    Atrial fibrillation Mother    Hypertension Mother    Cancer Father    Emphysema Father     Social History:  reports that he has quit smoking. His smoking use included cigarettes. He smoked an average of .3 packs per day. He has never used smokeless tobacco. He reports current alcohol use of about 3.0 standard drinks per week. He reports current drug use. Drug: Marijuana.  ROS: A complete review of systems was performed.  All systems are negative except for pertinent findings as noted.  Physical Exam:  Vital signs in last 24 hours: Temp:  [98.9 F (37.2 C)-99.1 F (37.3 C)] 99.1 F (37.3 C) (12/15 1431) Pulse Rate:  [63-75] 63 (12/15 1431) Resp:  [18-20] 19 (12/15 1431) BP: (118-122)/(76-82) 122/76 (12/15 1431) SpO2:  [99 %-100 %] 100 % (12/15 1431) Constitutional:  Alert and oriented, No acute distress Cardiovascular: Regular rate  and rhythm, No JVD Respiratory: Normal respiratory effort, Lungs clear bilaterally GI: Abdomen is taught and firm.  GU: No CVA tenderness. Normal external male anatomy, circumcised phallus, mild scrotal edema.  Lymphatic: No lymphadenopathy Neurologic: Does not use bilateral lower extremities, these are contracted  Psychiatric: Normal mood and affect  Laboratory Data:  Recent Labs    12/21/20 0520 12/22/20 0504 12/23/20 0513  WBC 7.4 7.7 7.5  HGB 7.4* 7.5* 7.8*  HCT 23.7* 23.6* 23.8*  PLT 125* 129* 142*    Recent Labs    12/22/20 0504 12/23/20 0513  NA 136 137  K 4.2 4.0  CL 110 112*  GLUCOSE 94 97  BUN 37* 42*  CALCIUM 7.8* 7.9*  CREATININE 1.24 1.38*     Results for orders placed or performed during the hospital encounter of 12/16/20 (from the past 24 hour(s))  CBC with Differential/Platelet     Status: Abnormal   Collection Time: 12/23/20  5:13 AM  Result Value Ref Range   WBC 7.5 4.0 - 10.5 K/uL   RBC 2.31 (L) 4.22 - 5.81 MIL/uL   Hemoglobin 7.8 (L) 13.0 - 17.0 g/dL   HCT 23.8 (L) 39.0 - 52.0 %   MCV 103.0 (H) 80.0 - 100.0 fL   MCH 33.8 26.0 - 34.0 pg   MCHC 32.8 30.0 - 36.0 g/dL   RDW 19.8 (H) 11.5 - 15.5 %   Platelets 142 (L) 150 - 400 K/uL   nRBC 2.4 (H) 0.0 - 0.2 %   Neutrophils Relative % 68 %   Neutro Abs 5.2 1.7 - 7.7 K/uL   Lymphocytes Relative 20 %   Lymphs Abs 1.5 0.7 - 4.0 K/uL   Monocytes Relative 6 %   Monocytes Absolute 0.4 0.1 - 1.0 K/uL   Eosinophils Relative 1 %   Eosinophils Absolute 0.1 0.0 - 0.5 K/uL   Basophils Relative 0 %   Basophils Absolute 0.0 0.0 - 0.1 K/uL  Immature Granulocytes 5 %   Abs Immature Granulocytes 0.34 (H) 0.00 - 0.07 K/uL  Comprehensive metabolic panel     Status: Abnormal   Collection Time: 12/23/20  5:13 AM  Result Value Ref Range   Sodium 137 135 - 145 mmol/L   Potassium 4.0 3.5 - 5.1 mmol/L   Chloride 112 (H) 98 - 111 mmol/L   CO2 18 (L) 22 - 32 mmol/L   Glucose, Bld 97 70 - 99 mg/dL   BUN 42 (H) 6  - 20 mg/dL   Creatinine, Ser 1.38 (H) 0.61 - 1.24 mg/dL   Calcium 7.9 (L) 8.9 - 10.3 mg/dL   Total Protein 4.8 (L) 6.5 - 8.1 g/dL   Albumin 2.3 (L) 3.5 - 5.0 g/dL   AST 21 15 - 41 U/L   ALT 20 0 - 44 U/L   Alkaline Phosphatase 30 (L) 38 - 126 U/L   Total Bilirubin 0.6 0.3 - 1.2 mg/dL   GFR, Estimated 59 (L) >60 mL/min   Anion gap 7 5 - 15  Phosphorus     Status: Abnormal   Collection Time: 12/23/20  5:13 AM  Result Value Ref Range   Phosphorus 2.3 (L) 2.5 - 4.6 mg/dL  Magnesium     Status: Abnormal   Collection Time: 12/23/20  5:13 AM  Result Value Ref Range   Magnesium 1.5 (L) 1.7 - 2.4 mg/dL  Resp Panel by RT-PCR (Flu A&B, Covid) Nasopharyngeal Swab     Status: None   Collection Time: 12/23/20 11:50 AM   Specimen: Nasopharyngeal Swab; Nasopharyngeal(NP) swabs in vial transport medium  Result Value Ref Range   SARS Coronavirus 2 by RT PCR NEGATIVE NEGATIVE   Influenza A by PCR NEGATIVE NEGATIVE   Influenza B by PCR NEGATIVE NEGATIVE   Recent Results (from the past 240 hour(s))  Resp Panel by RT-PCR (Flu A&B, Covid) Nasopharyngeal Swab     Status: None   Collection Time: 12/17/20 12:17 AM   Specimen: Nasopharyngeal Swab; Nasopharyngeal(NP) swabs in vial transport medium  Result Value Ref Range Status   SARS Coronavirus 2 by RT PCR NEGATIVE NEGATIVE Final    Comment: (NOTE) SARS-CoV-2 target nucleic acids are NOT DETECTED.  The SARS-CoV-2 RNA is generally detectable in upper respiratory specimens during the acute phase of infection. The lowest concentration of SARS-CoV-2 viral copies this assay can detect is 138 copies/mL. A negative result does not preclude SARS-Cov-2 infection and should not be used as the sole basis for treatment or other patient management decisions. A negative result may occur with  improper specimen collection/handling, submission of specimen other than nasopharyngeal swab, presence of viral mutation(s) within the areas targeted by this assay, and  inadequate number of viral copies(<138 copies/mL). A negative result must be combined with clinical observations, patient history, and epidemiological information. The expected result is Negative.  Fact Sheet for Patients:  EntrepreneurPulse.com.au  Fact Sheet for Healthcare Providers:  IncredibleEmployment.be  This test is no t yet approved or cleared by the [place] FDA and  has been authorized for detection and/or diagnosis of SARS-CoV-2 by FDA under an Emergency Use Authorization (EUA). This EUA will remain  in effect (meaning this test can be used) for the duration of the COVID-19 declaration under Section 564(b)(1) of the Act, 21 U.S.C.section 360bbb-3(b)(1), unless the authorization is terminated  or revoked sooner.       Influenza A by PCR NEGATIVE NEGATIVE Final   Influenza B by PCR NEGATIVE NEGATIVE Final    Comment: (  NOTE) The Xpert Xpress SARS-CoV-2/FLU/RSV plus assay is intended as an aid in the diagnosis of influenza from Nasopharyngeal swab specimens and should not be used as a sole basis for treatment. Nasal washings and aspirates are unacceptable for Xpert Xpress SARS-CoV-2/FLU/RSV testing.  Fact Sheet for Patients: EntrepreneurPulse.com.au  Fact Sheet for Healthcare Providers: IncredibleEmployment.be  This test is not yet approved or cleared by the [place] FDA and has been authorized for detection and/or diagnosis of SARS-CoV-2 by FDA under an Emergency Use Authorization (EUA). This EUA will remain in effect (meaning this test can be used) for the duration of the COVID-19 declaration under Section 564(b)(1) of the Act, 21 U.S.C. section 360bbb-3(b)(1), unless the authorization is terminated or revoked.  Performed at [facility], [place] [address], [place], [place] [address]   MRSA Next Gen by PCR, Nasal     Status: Abnormal   Collection Time: 12/18/20   6:51 AM   Specimen: Nasal Mucosa; Nasal Swab  Result Value Ref Range Status   MRSA by PCR Next Gen DETECTED (A) NOT DETECTED Final    Comment: RESULT CALLED TO, READ BACK BY AND VERIFIED WITH: JOE RN ON 12/18/20 AT 1149 BY GOLSONM Performed at [facility] [address] [place], [place] [address]   Resp Panel by RT-PCR (Flu A&B, Covid) Nasopharyngeal Swab     Status: None   Collection Time: 12/23/20 11:50 AM   Specimen: Nasopharyngeal Swab; Nasopharyngeal(NP) swabs in vial transport medium  Result Value Ref Range Status   SARS Coronavirus 2 by RT PCR NEGATIVE NEGATIVE Final    Comment: (NOTE) SARS-CoV-2 target nucleic acids are NOT DETECTED.  The SARS-CoV-2 RNA is generally detectable in upper respiratory specimens during the acute phase of infection. The lowest concentration of SARS-CoV-2 viral copies this assay can detect is 138 copies/mL. A negative result does not preclude SARS-Cov-2 infection and should not be used as the sole basis for treatment or other patient management decisions. A negative result may occur with  improper specimen collection/handling, submission of specimen other than nasopharyngeal swab, presence of viral mutation(s) within the areas targeted by this assay, and inadequate number of viral copies(<138 copies/mL). A negative result must be combined with clinical observations, patient history, and epidemiological information. The expected result is Negative.  Fact Sheet for Patients:  EntrepreneurPulse.com.au  Fact Sheet for Healthcare Providers:  IncredibleEmployment.be  This test is no t yet approved or cleared by the [place] FDA and  has been authorized for detection and/or diagnosis of SARS-CoV-2 by FDA under an Emergency Use Authorization (EUA). This EUA will remain  in effect (meaning this test can be used) for the duration of the COVID-19 declaration under Section 564(b)(1) of the Act,  21 U.S.C.section 360bbb-3(b)(1), unless the authorization is terminated  or revoked sooner.       Influenza A by PCR NEGATIVE NEGATIVE Final   Influenza B by PCR NEGATIVE NEGATIVE Final    Comment: (NOTE) The Xpert Xpress SARS-CoV-2/FLU/RSV plus assay is intended as an aid in the diagnosis of influenza from Nasopharyngeal swab specimens and should not be used as a sole basis for treatment. Nasal washings and aspirates are unacceptable for Xpert Xpress SARS-CoV-2/FLU/RSV testing.  Fact Sheet for Patients: EntrepreneurPulse.com.au  Fact Sheet for Healthcare Providers: IncredibleEmployment.be  This test is not yet approved or cleared by the [place] FDA and has been authorized for detection and/or diagnosis of SARS-CoV-2 by FDA under an Emergency Use Authorization (EUA). This EUA will remain in  effect (meaning this test can be used) for the duration of the COVID-19 declaration under Section 564(b)(1) of the Act, 21 U.S.C. section 360bbb-3(b)(1), unless the authorization is terminated or revoked.  Performed at [facility], [place] [address] [place], [place] [address]     Renal Function: Recent Labs    12/16/20 1654 12/17/20 0605 12/17/20 1200 12/18/20 0543 12/20/20 0511 12/22/20 0504 12/23/20 0513  CREATININE 2.30* 1.89* 1.78* 1.65* 1.50* 1.24 1.38*   CrCl cannot be calculated (Unknown ideal weight.).  Radiologic Imaging: US RENAL  Result Date: 12/21/2020 CLINICAL DATA:  Hydronephrosis EXAM: RENAL / URINARY TRACT ULTRASOUND COMPLETE COMPARISON:  CT 12/16/2020 FINDINGS: Right Kidney: Renal measurements: 10.1 x 5 x 5.6 cm = volume: 149.6 mL. Echogenicity within normal limits. No mass or hydronephrosis visualized. Left Kidney: Renal measurements: 9.4 x 5.2 x 5.2 cm = volume: 133 mL. Echogenicity within normal limits. No mass or hydronephrosis visualized. Bladder: Not visualized and likely empty. Other: None.  IMPRESSION: Negative for hydronephrosis on today's study. Electronically Signed   By: Donavan Foil M.D.   On: 12/21/2020 18:52    I independently reviewed the above imaging studies.  Impression/Recommendation  #Urinary Retention:  Initially came in to ED in retention and CT imaging with bilateral hydronephrosis which resolved on repeat RUS after foley decompression. He failed an inpatient trial of void and nursing was unable to replace at bedside. 14 Fr catheter passed easily with return of 500cc yellow urine. Unclear at this point why patient is retaining, likely multifactorial. Plan to have him follow up outpatient in 7 days for repeat voiding trial. If fails, may need cystoscopy to assess for recurrence of stricture.    I reviewed the resident's note and agree with the documented findings and plan of care.  Aldine Contes 12/23/2020, 3:41 PM

## 2020-12-23 NOTE — Care Management Important Message (Signed)
Important Message  Patient Details IM Letter given to the Patient. Name: [name] [name] MRN: [id number] Date of Birth: 01-Dec-1960   Medicare Important Message Given:  Yes     [name] 12/23/2020, 10:42 AM

## 2020-12-23 NOTE — NC FL2 (Signed)
[place] LEVEL OF CARE SCREENING TOOL     IDENTIFICATION  Patient Name: [name] [name] Birthdate: 1960/10/14 Sex: male Admission Date (Current Location): 12/16/2020  [facility] and [place] Number:  Herbalist and Address:  [facility],  [place], [place]      Provider Number: [id number]  Attending Physician Name and Address:  Kerney Elbe, DO  Relative Name and Phone Number:  [name] (Sister)   [phone]    Current Level of Care: Hospital Recommended Level of Care: [place] Prior Approval Number:    Date Approved/Denied:   PASRR Number:    Discharge Plan: SNF    Current Diagnoses: Patient Active Problem List   Diagnosis Date Noted   Thrombocytopenia ([place]) 12/21/2020   Bleeding from wound 12/19/2020   ABLA (acute blood loss anemia) 12/19/2020   Acute urinary retention 12/19/2020   FTT (failure to thrive) in adult 12/19/2020   GIB (gastrointestinal bleeding) 12/16/2020   Palliative care encounter 12/09/2020   Ileus ([place]) 12/09/2020   Encephalopathy    Pressure injury of skin 10/10/2020   Acute encephalopathy 42/59/5638   Acute metabolic encephalopathy 75/64/3329   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   Chronic diastolic CHF (congestive heart failure) ([place]) 10/09/2020   Anemia 10/09/2020   Rigidity 10/09/2020   Hypernatremia 10/09/2020   Functional quadriplegia ([place]) 10/09/2020   Onychomycosis 08/19/2020   Penile ulcer 08/19/2020   Malnutrition of moderate degree 05/14/2020   AKI (acute kidney injury) ([place]) 05/13/2020   Lactic acidosis 05/13/2020   SDH (subdural hematoma) 05/13/2020   Hypothermia due to exposure 05/13/2020   Septic shock ([place]) 05/13/2020   Cellulitis 05/13/2020   Elevated brain natriuretic peptide (BNP) level 05/13/2020   Volume overload 05/13/2020   Dehydration 05/13/2020   Decubitus ulcer 05/13/2020   Subdural empyema    Streptococcal bacteremia     Rectal bleeding 02/20/2020   Abnormal digital rectal exam 02/20/2020   History of anal cancer 02/20/2020   Poor dentition 01/16/2017   Renal failure, chronic 06/10/2014   Septic arthritis of hip ([place]) 07/31/2013   Sacral decubitus ulcer, stage IV ([place]) 07/31/2013   Protein-calorie malnutrition, severe ([place]) 07/31/2013   Pain in limb 07/23/2013   Inguinal adenopathy 02/18/2013   Enlargement of lymph nodes 01/13/2013   Urethral stricture 09/05/2007   ANEMIA, CHRONIC 03/20/2007   Unspecified severe protein-calorie malnutrition ([place]) 02/14/2007   Acute thromboembolism of deep veins of lower extremity ([place]) 08/22/2006   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[place]) 05/14/2006   ABSCESS, RECTUM 04/11/2006   Thrush of mouth and esophagus ([place]) 02/16/2006   DIARRHEA 02/16/2006   Human immunodeficiency virus (HIV) disease ([place]) 10/19/2005   MOLLUSCUM CONTAGIOSUM 10/19/2005   GOUT 10/19/2005   HEPATITIS B, HX OF 10/19/2005   HERPES ZOSTER, HX OF 10/19/2005    Orientation RESPIRATION BLADDER Height & Weight     Self, Time, Situation, Place  Normal External catheter Weight:  (Unable to complete, patient in severe pain from wound care) Height:     BEHAVIORAL SYMPTOMS/MOOD NEUROLOGICAL BOWEL NUTRITION STATUS      Incontinent Diet (see d/c summary)  AMBULATORY STATUS COMMUNICATION OF NEEDS Skin   Extensive Assist Verbally Other (Comment) (Pressure injury, Sacrum, mid; Wounds, Bi-lateral feet, lateral; Penis)                       Personal Care Assistance Level of Assistance  Bathing, Feeding, Dressing Bathing Assistance: Maximum assistance Feeding assistance: Limited assistance  Dressing Assistance: Maximum assistance     Functional Limitations Info  Sight, Hearing, Speech Sight Info: Adequate Hearing Info: Adequate Speech Info: Adequate    SPECIAL CARE FACTORS FREQUENCY                       Contractures Contractures Info: Not present    Additional Factors Info  Code Status,  Allergies Code Status Info: DNR Allergies Info: Collagen           Current Medications (12/23/2020):  This is the current hospital active medication list Current Facility-Administered Medications  Medication Dose Route Frequency Provider Last Rate Last Admin   (feeding supplement) PROSource Plus liquid 30 mL  30 mL Oral BID BM Samuella Cota, MD   30 mL at 12/22/20 1622   0.9 %  sodium chloride infusion (Manually program via Guardrails IV Fluids)   Intravenous Once Cristal Deer, MD       acetaminophen (TYLENOL) tablet 650 mg  650 mg Oral Q6H PRN Gatha Mayer, MD   650 mg at 12/20/20 1834   Or   acetaminophen (TYLENOL) suppository 650 mg  650 mg Rectal Q6H PRN Gatha Mayer, MD       apixaban Arne [place]) tablet 2.5 mg  2.5 mg Oral BID Sheikh, Omair Latif, DO   2.5 mg at 12/22/20 1623   ascorbic acid (VITAMIN C) tablet 500 mg  500 mg Oral BID Samuella Cota, MD   500 mg at 12/22/20 2058   Chlorhexidine Gluconate Cloth 2 % PADS 6 each  6 each Topical Daily Modena Jansky, MD   6 each at 12/22/20 1438   darunavir-cobicistat (PREZCOBIX) 800-150 MG per tablet 1 tablet  1 tablet Oral Daily Gatha Mayer, MD   1 tablet at 12/22/20 1050   dolutegravir (TIVICAY) tablet 50 mg  50 mg Oral Daily Gatha Mayer, MD   50 mg at 12/22/20 1049   famotidine (PEPCID) tablet 20 mg  20 mg Oral Daily Vernell Leep D, MD   20 mg at 12/22/20 2058   feeding supplement (ENSURE ENLIVE / ENSURE PLUS) liquid 237 mL  237 mL Oral Q24H Samuella Cota, MD   237 mL at 12/22/20 2058   HYDROmorphone (DILAUDID) injection 0.5-1 mg  0.5-1 mg Intravenous Q2H PRN Gatha Mayer, MD   1 mg at 12/23/20 0546   magnesium sulfate IVPB 2 g 50 mL  2 g Intravenous Once [place], Omair Latif, DO       mupirocin ointment (BACTROBAN) 2 %   Nasal BID Cristal Deer, MD   Given at 12/22/20 2101   nutrition supplement (JUVEN) (JUVEN) powder packet 1 packet  1 packet Oral BID BM Samuella Cota, MD   1 packet at  12/23/20 0830   ondansetron (ZOFRAN) tablet 4 mg  4 mg Oral Q6H PRN Gatha Mayer, MD       Or   ondansetron [facility]) injection 4 mg  4 mg Intravenous Q6H PRN Gatha Mayer, MD   4 mg at 12/19/20 0110   oxyCODONE (Oxy IR/ROXICODONE) immediate release tablet 5 mg  5 mg Oral Q6H PRN Gatha Mayer, MD   5 mg at 12/22/20 2058   phosphorus (K PHOS NEUTRAL) tablet 500 mg  500 mg Oral Once [place], Omair Latif, DO       polycarbophil (FIBERCON) tablet 625 mg  625 mg Oral Daily Gatha Mayer, MD   625 mg at 12/22/20 1050  rilpivirine (EDURANT) tablet 25 mg  25 mg Oral Q breakfast Gatha Mayer, MD   25 mg at 12/23/20 0830   sodium bicarbonate tablet 650 mg  650 mg Oral TID Raiford Noble Latif, DO       vitamin B-12 (CYANOCOBALAMIN) tablet 100 mcg  100 mcg Oral Daily Gatha Mayer, MD   100 mcg at 12/22/20 1049   Zinc Oxide (TRIPLE PASTE) 12.8 % ointment   Topical TID Samuella Cota, MD   Given at 12/22/20 2102   zinc sulfate capsule 220 mg  220 mg Oral Daily Samuella Cota, MD   220 mg at 12/22/20 1049     Discharge Medications: Please see discharge summary for a list of discharge medications.  Relevant Imaging Results:  Relevant Lab Results:   Additional Information Cesar Chavez, [place]

## 2020-12-23 NOTE — Progress Notes (Signed)
The pt's report was called to Esperanza Heir at [facility] [phone], room 211b. All the questions were answered.

## 2020-12-23 NOTE — Telephone Encounter (Signed)
RN returned call to sister.  She called to let us know that patient was admitted to hospital 12/8 and is returning to  LTC area of [facility]. She wanted to introduce herself and make sure that Windy Canny would still be seeing him. Reports that he has declined some, somewhat confused at times and decreased ambulation and intake overall since 1st of December. Will be back at [facility] to the LTC section in the next day. Report to palliative team

## 2020-12-23 NOTE — Progress Notes (Signed)
Pt is alert and oriented x 3 with no s/s of distress. VS stable at time of transport.

## 2020-12-23 NOTE — TOC Transition Note (Addendum)
Transition of Care [facility]) - CM/SW Discharge Note   Patient Details  Name: [name] [name] MRN: [id number] Date of Birth: Apr 26, 1960  Transition of [facility]) CM/SW Contact:  Trish Mage, LCSW Phone Number: 12/23/2020, 10:53 AM   Clinical Narrative:   Patient who is stable for d/c will return to [facility] SNF. Left message for sister. PTAR arranged. Nursing, please call report to [phone], room 211b. TOC sign off.    Final next level of care: Skilled Nursing Facility Barriers to Discharge: No Barriers Identified   Patient Goals and CMS Choice        Discharge Placement                       Discharge Plan and Services                                     Social Determinants of Health (SDOH) Interventions     Readmission Risk Interventions Readmission Risk Prevention Plan 05/28/2020  Transportation Screening Complete  PCP or Specialist Appt within 3-5 Days Complete  HRI or [place] Complete  Social Work Consult for [place] Planning/Counseling Complete  Palliative Care Screening Complete  Medication Review Press photographer) Complete  Some recent data might be hidden

## 2020-12-23 NOTE — Progress Notes (Signed)
[facility] Hospital Liaison note:  This patient is currently enrolled in [facility] outpatient-based Palliative Care. Will continue to follow for disposition.  Please call with any outpatient palliative questions or concerns.  Thank you, Dee Curry, LPN [facility] Hospital Liaison [phone] 

## 2020-12-23 NOTE — Procedures (Signed)
Foley Catheter Placement Note  Indications: 60 y.o. male with history of HIV, DVT, anal cancer s/p chemotherapy and radiation, functional paraplegia, chronic sacral ulcers who is admitted for rectal bleeding. He was found to be in urinary retention on admission with bilateral hydro on CT imaging so had been managed with an indwelling foley catheter. He underwent voiding trial and could not void so plan was for catheter to be replaced, nursing was unable to replace catheter.   Pre-operative Diagnosis: Urinary retention  Post-operative Diagnosis: Same  Surgeon: Aldine Contes, MD  Assistants: None  Procedure Details  Patient was placed in the supine position, prepped with Betadine and draped in the usual sterile fashion.  We injected lidocaine jelly per urethra prior to the procedure.  We then inserted a 14 [place] coude catheter per urethra which easily passed into the bladder without any resistance at the prostatic urethra.  We achieved return of clear yellow urine and then proceeded to insert 10 mL of sterile water into the Foley balloon.  The catheter was attached to a drainage bag and secured with a StatLock.  Placement of the catheter had return of greater than 529mL of clear yellow urine.               Complications: None; patient tolerated the procedure well.  Plan:   1.  Continue Foley catheter to drainage for 7 days at which point we will have patient return to urology clinic for repeat voiding trial.        Attending Attestation: Dr. Milford Cage was available.

## 2020-12-23 NOTE — Discharge Summary (Signed)
Physician Discharge Summary  [name] [name]:[id number] DOB: [birth year] DOA: 12/16/2020  PCP: [name], MD  Admit date: 12/16/2020 Discharge date: 12/23/2020  Admitted From: SNF Disposition: SNF  Recommendations for Outpatient Follow-up:  Follow up with PCP in 1-2 weeks Continue Palliative Care follow up at [facility]  Follow up with General Surgery within 1-2 weeks Please obtain CMP/CBC, Mag, Phos in one week Please follow up on the following pending results:  Home Health: No Equipment/Devices: None    Discharge Condition: Stable  CODE STATUS: DO NOT RESUSCITATE   Diet recommendation: Regular Diet   Brief/Interim Summary: The Patient is a chronically ill-appearing 60 year old Caucasian male with past medical history significant for but not limited to HIV, history of DVT, history of anal cancer status postchemotherapy and radiation therapy, history of functional paraplegia, chronic sacral wound with previous osteomyelitis, as well as other comorbidities who presented with rectal bleeding concern for GI bleed.  Initially Eliquis was held and he was admitted for further evaluation.  He was seen by GI who did a flexible sigmoidoscopy and per GI the bleeding was thought to be related to the wound and not GI in nature so they recommended surgical consultation.  Surgery evaluated and recommended conservative management.  Patient has had no further bleeding noted he is started on enoxaparin yesterday.  Given that his hemoglobin has been stable we will start apixaban today but reduce the dose given his HIV medications.  Anticipating discharging back to his facility today since has no further significant bleeding at all and his hemoglobin is stable. He will need continued Palliative Follow up at D/C and follow up with General Surgery as an outpatient within 1-2 weeks.    Discharge Diagnoses:  Principal Problem:   Bleeding from wound Active Problems:   Human immunodeficiency virus (HIV)  disease ([place])   Acute thromboembolism of deep veins of lower extremity (HCC)   Sacral decubitus ulcer, stage IV (HCC)   History of anal cancer   AKI (acute kidney injury) ([place])   Sacral osteomyelitis (HCC)   Functional quadriplegia (HCC)   ABLA (acute blood loss anemia)   Acute urinary retention   FTT (failure to thrive) in adult   Thrombocytopenia (HCC)  Bleeding from wound -Initially thought to have GI bleed, but was seen by gastroenterology and had flexible sigmoidoscopy, per GI bleeding is thought to be non-GI in nature, probably from wound.  GI recommended consideration of hospice.  Was evaluated in March of this year for rectal bleeding at that time no evidence of recurrence of cancer. Eliquis contributed to bleeding from wound and had been held but is now being resumed today given that he tolerated his Lovenox yesterday -General surgery saw as above, no current bleeding, conservative management recommended, plan as above.   ABLA (acute blood loss anemia) superimposed on chronic macrocytic anemia -Thought to be secondary to bleeding from wound.  Hemoglobin up appropriately status post 2 units PRBC 12/11.  Hemoglobin stable today. -Seen by general surgery, no active bleeding now, recommend conservative management, wound care. -Started enoxaparin and has now been transitioned to p.o. anticoagulant with apixaban but will dose adjust given that he is on HIV medications. Now on Apixaban 2.5 mg po BID  -Patient's hemoglobin/hematocrit has been relatively stable and has gone from 6.0/19.6 -> 7.1/22.0 -> 8.3/24.4 -> 7.6/23.7 -> 7.4/23.7 -> 7.5/23.6 -> 7.8/23.8 -Continue to monitor for signs and symptoms of bleeding; currently no overt bleeding noted and he is stable to D/C    Sacral  decubitus ulcer, stage IV (HCC) -Bleeding from this wound is thought to be the culprit,no evidence of current bleeding.  Did require blood transfusion.   -Per previous physician, communication with ID recommended  no treatment for osteomyelitis at this time. -Wound care with Zinc Paste and Zinc Tablets.   Human immunodeficiency virus (HIV) disease ([place]) -Continue HAART with darunavir-cobicistat 1 tab p.o. daily and Dolutegravir 50 mg po Daily and Rilpivirine 25 mg po Daily , follow-up with infectious disease as an outpatient.   Thrombocytopenia ([place]) -Probably from blood loss -Count trended down to 125 and is now stable and trending back up to 129 -> 142 -Continue to monitor for further signs and symptoms of bleeding -Repeat CBC in a.m.   Functional quadriplegia (HCC) -Possible diagnosis.  PT evaluation recommended no follow-up. -He is a Long-term care.   AKI (acute kidney injury) ([place]) Metabolic Acidosis -Baseline difficult to ascertain but creatinine was 1.25 October 5 this year.   -Thought to be prerenal in nature.   -Improved with IV fluids./Creatinine stable at 37/1.24 and is 42/1.38 today  -Patient has a mild metabolic acidosis with a CO2 of 18, anion gap is 7, chloride level of 112 -Started Bicarbonate 650 mg po TID  -Check renal ultrasound to assess for resolution of bilateral hydroureteronephrosis seen on CT thought secondary to bladder outlet obstruction and now his hydronephrosis is improved -We will continue to monitor renal function carefully and avoid nephrotoxic medications, contrast dyes, hypotension and renally adjust medications -Repeat CMP in a.m.   Acute urinary retention -Started on Flomax,  -Checked renal u/s and since hydro resolved, can proceed with voiding trial 12/14 and have ordered Foley catheter to be discontinued -Has not voided yet so Foley may need to be reinserted and if it does will keep in place for 10 days and have outpatient TOV with Urology    Acute thromboembolism of deep veins of lower extremity ([place]) -Diagnosed early December.   -Started on Eliquis presumptively, end of November.   -Eliquis was on hold given significant bleeding, but since he tolerated  Lovenox yesterday with no further bleeding we have reinitiated apixaban but dose adjusted to 2.5 mg po BID  Hypomagnesemia -Mag Level was 1.5 -Replete with IV Mag Sulfate 2 grams -Continue to Monitor and Replete as Necessary -Repeat Mag Level within 1 week  Hypophosphatemia -Phos Level was 2.3 -Replete with po K Phos Neutral 500 mg x1 -Continue to Monitor and Replete as Necessary -Repeat Phos Level within 1 week    FTT (failure to thrive) in adult -Appreciate palliative care and they will follow at D/C.  Discharge Instructions Discharge Instructions     Call MD for:  difficulty breathing, headache or visual disturbances   Complete by: As directed    Call MD for:  extreme fatigue   Complete by: As directed    Call MD for:  hives   Complete by: As directed    Call MD for:  persistant dizziness or light-headedness   Complete by: As directed    Call MD for:  persistant nausea and vomiting   Complete by: As directed    Call MD for:  redness, tenderness, or signs of infection (pain, swelling, redness, odor or green/yellow discharge around incision site)   Complete by: As directed    Call MD for:  severe uncontrolled pain   Complete by: As directed    Call MD for:  temperature >100.4   Complete by: As directed    Diet - low sodium  heart healthy   Complete by: As directed    Discharge instructions   Complete by: As directed    You were cared for by a hospitalist during your hospital stay. If you have any questions about your discharge medications or the care you received while you were in the hospital after you are discharged, you can call the unit and ask to speak with the hospitalist on call if the hospitalist that took care of you is not available. Once you are discharged, your primary care physician will handle any further medical issues. Please note that NO REFILLS for any discharge medications will be authorized once you are discharged, as it is imperative that you return to  your primary care physician (or establish a relationship with a primary care physician if you do not have one) for your aftercare needs so that they can reassess your need for medications and monitor your lab values.  Follow up with PCP, ID, and General Surgery within 1-2 weeks. Take all medications as prescribed. If symptoms change or worsen please return to the ED for evaluation   Discharge wound care:   Complete by: As directed    Wound care  Daily      Comments: 1.  Apply Aquacel Kellie Simmering # [phone]) to sacrum Q day, then cover with ABD pads and tape 2. Apply Aquacel to BLE wounds Q day and cover with foam dressings, change foam dressings Q 3 days or PRN soiling. Moisten previous dressings with NS to remove each time. 3. Foam dressings to bilat heels, change Q 3 days or PRN soiling   Increase activity slowly   Complete by: As directed       Allergies as of 12/23/2020       Reactions   Collagen Rash   redness        Medication List     STOP taking these medications    megestrol 625 MG/5ML suspension Commonly known as: Megace ES   pantoprazole 20 MG tablet Commonly known as: PROTONIX       TAKE these medications    acetaminophen 325 MG tablet Commonly known as: TYLENOL Take 2 tablets (650 mg total) by mouth every 6 (six) hours as needed for mild pain (or Fever >/= 101).   apixaban 2.5 MG Tabs tablet Commonly known as: ELIQUIS Take 1 tablet (2.5 mg total) by mouth 2 (two) times daily. What changed:  medication strength how much to take   bisacodyl 10 MG suppository Commonly known as: DULCOLAX Place 10 mg rectally once as needed for moderate constipation.   cyanocobalamin 100 MCG tablet Take 100 mcg by mouth daily.   Edurant 25 MG Tabs tablet Generic drug: rilpivirine TAKE 1 TABLET (25 MG TOTAL) BY MOUTH DAILY WITH BREAKFAST What changed:  how much to take how to take this when to take this additional instructions   famotidine 20 MG tablet Commonly  known as: PEPCID Take 1 tablet (20 mg total) by mouth daily.   feeding supplement (PRO-STAT SUGAR FREE 64) Liqd Take 30 mLs by mouth 3 (three) times daily.   feeding supplement Liqd Take 237 mLs by mouth daily. What changed: when to take this   (feeding supplement) PROSource Plus liquid Take 30 mLs by mouth 2 (two) times daily between meals. What changed: You were already taking a medication with the same name, and this prescription was added. Make sure you understand how and when to take each.   nutrition supplement (JUVEN) Pack Take 1 packet by  mouth 2 (two) times daily between meals. What changed: You were already taking a medication with the same name, and this prescription was added. Make sure you understand how and when to take each.   loperamide 2 MG tablet Commonly known as: IMODIUM A-D Take 2-4 mg by mouth as needed for diarrhea or loose stools (max dose of 16mg /24 hours). 4mg  after 1st loose stool and 2mg  after each subsequent BM   ondansetron 4 MG tablet Commonly known as: ZOFRAN Take 1 tablet (4 mg total) by mouth every 6 (six) hours as needed for nausea.   oxycodone 5 MG capsule Commonly known as: OXY-IR Take 1 capsule (5 mg total) by mouth See admin instructions. Give 5mg  by mouth daily for wound care. May also give 5mg  every 6 hours as needed for chronic pain   polycarbophil 625 MG tablet Commonly known as: FIBERCON Take 625 mg by mouth daily.   Prezcobix 800-150 MG tablet Generic drug: darunavir-cobicistat TAKE 1 TABLET BY MOUTH DAILY . SWALLOW WHOLE, DO NOT CRUSH, BREAK, OR CHEW TABLETS. TAKE WITH FOOD What changed:  how much to take how to take this when to take this additional instructions   simethicone 80 MG chewable tablet Commonly known as: MYLICON Chew 80 mg by mouth with breakfast, with lunch, and with evening meal.   sodium bicarbonate 650 MG tablet Take 1 tablet (650 mg total) by mouth 3 (three) times daily for 1 day.   Tivicay 50 MG  tablet Generic drug: dolutegravir TAKE 1 TABLET (50 MG TOTAL) BY MOUTH DAILY What changed: how much to take   vitamin C 500 MG tablet Commonly known as: ASCORBIC ACID Take 1,000 mg by mouth daily.   Zinc Oxide 12.8 % ointment Commonly known as: TRIPLE PASTE Apply topically 3 (three) times daily.   zinc sulfate 220 (50 Zn) MG capsule Take 1 capsule (220 mg total) by mouth daily. Start taking on: December 24, 2020               Discharge Care Instructions  (From admission, onward)           Start     Ordered   12/23/20 0000  Discharge wound care:       Comments: Wound care  Daily      Comments: 1.  Apply Aquacel Kellie Simmering # [phone]) to sacrum Q day, then cover with ABD pads and tape 2. Apply Aquacel to BLE wounds Q day and cover with foam dressings, change foam dressings Q 3 days or PRN soiling. Moisten previous dressings with NS to remove each time. 3. Foam dressings to bilat heels, change Q 3 days or PRN soiling   12/23/20 1300            Allergies  Allergen Reactions   Collagen Rash    redness   Consultations: Discussed with ID Gastroenterology General Surgery Palliative care  Procedures/Studies: CT ABDOMEN PELVIS WO CONTRAST  Result Date: 12/16/2020 CLINICAL DATA:  Acute abdominal pain.  Rectal bleeding. EXAM: CT ABDOMEN AND PELVIS WITHOUT CONTRAST TECHNIQUE: Multidetector CT imaging of the abdomen and pelvis was performed following the standard protocol without IV contrast. COMPARISON:  CT abdomen and pelvis 05/12/2020. FINDINGS: Lower chest: No acute abnormality. Hepatobiliary: No focal liver abnormality is seen. No gallstones, gallbladder wall thickening, or biliary dilatation. Pancreas: Unremarkable. No pancreatic ductal dilatation or surrounding inflammatory changes. Spleen: Normal in size without focal abnormality. Adrenals/Urinary Tract: The bladder is dilated. There is moderate bilateral hydroureteronephrosis to the level of the  bladder without  obstructing calculus. Otherwise, the kidneys appear within normal limits. The adrenal glands are within normal limits. Stomach/Bowel: There is a small hiatal hernia. Stomach is otherwise within normal limits. Appendix appears normal. No evidence of bowel wall thickening, distention, or inflammatory changes. There is a large amount of stool throughout the colon. Vascular/Lymphatic: Aortic atherosclerosis. No enlarged abdominal or pelvic lymph nodes. Reproductive: Prostate is unremarkable. Other: There is a small fat containing left inguinal hernia. There is no ascites or free air. Musculoskeletal: Right hip arthroplasty is present. Bones are osteopenic. There has been progression of soft tissue defect overlying the posterior sacrum. There is new sacral sclerosis thinning this level with new presacral edema. Findings are concerning for osteomyelitis. IMPRESSION: 1. Bladder is dilated. Moderate bilateral hydroureteronephrosis without obstructing calculi. Findings are concerning for bladder outlet obstruction. Recommend clinical correlation and follow-up. 2. Progression of soft tissue defect overlying the sacrum with new sacral thinning and sclerosis and new presacral edema. Findings are worrisome for osteomyelitis. Electronically Signed   By: Ronney Asters M.D.   On: 12/16/2020 22:20   US RENAL  Result Date: 12/21/2020 CLINICAL DATA:  Hydronephrosis EXAM: RENAL / URINARY TRACT ULTRASOUND COMPLETE COMPARISON:  CT 12/16/2020 FINDINGS: Right Kidney: Renal measurements: 10.1 x 5 x 5.6 cm = volume: 149.6 mL. Echogenicity within normal limits. No mass or hydronephrosis visualized. Left Kidney: Renal measurements: 9.4 x 5.2 x 5.2 cm = volume: 133 mL. Echogenicity within normal limits. No mass or hydronephrosis visualized. Bladder: Not visualized and likely empty. Other: None. IMPRESSION: Negative for hydronephrosis on today's study. Electronically Signed   By: Donavan Foil M.D.   On: 12/21/2020 18:52    Flex  sigmoidoscopy 12/17/2020 Findings:      The perianal exam findings include blood clots, skin breakdown and       induration.      The digital rectal exam findings include decreased tone with indurated       and open canal.      Theree were clots of blood in anal canal which is indurated and       ulcerated. There is ulceration at the anorectum with bleeding in some       areas. Distortion of anatomy. The rectum and very distal sigmoid have       irregular fdriable mucosa with minimal contact bleeding. There is stool       on proximal rectum and distal sigmoid but no good evidence for bleeding       from there - though exam very limited. Impression:               - Blood clots, skin breakdown and induration found                            on perianal exam.                           - Decreased tone with indurated and open canal                            found on digital rectal exam.                           Ulcerated and distorted anal and perianal area with  bleeding. ? from osteomyelitis or other cause like                            pressure injury. Nothing amenable to endoscopic                            treatment. I do not think there is more proximal                            bleeding source.                           - No specimens collected.  Subjective: Seen and examined at bedside and he is doing well with no bleeding.  No chest pain or shortness of breath.  No lightheadedness or dizziness.  No other concerns or complaints this time is ready to go back to his facility.  Discharge Exam: Vitals:   12/22/20 2044 12/23/20 0447  BP: 118/79 120/82  Pulse: 75 72  Resp: 19 20  Temp: 98.9 F (37.2 C) 98.9 F (37.2 C)  SpO2: 100% 99%   Vitals:   12/22/20 1449 12/22/20 1654 12/22/20 2044 12/23/20 0447  BP: 105/71  118/79 120/82  Pulse: 82  75 72  Resp: 16 18 19 20   Temp: 98.6 F (37 C)  98.9 F (37.2 C) 98.9 F (37.2 C)  TempSrc: Oral      SpO2: 100%  100% 99%   General: Pt is a thin chronically ill-appearing Caucasian male currently who is alert, awake, not in acute distress Cardiovascular: RRR, S1/S2 +, no rubs, no gallops Respiratory: Diminished bilaterally, no wheezing, no rhonchi Abdominal: Soft, NT, ND, bowel sounds + Extremities: no edema, no cyanosis; has a sacral decubitus ulcer noted  The results of significant diagnostics from this hospitalization (including imaging, microbiology, ancillary and laboratory) are listed below for reference.    Microbiology: Recent Results (from the past 240 hour(s))  Resp Panel by RT-PCR (Flu A&B, Covid) Nasopharyngeal Swab     Status: None   Collection Time: 12/17/20 12:17 AM   Specimen: Nasopharyngeal Swab; Nasopharyngeal(NP) swabs in vial transport medium  Result Value Ref Range Status   SARS Coronavirus 2 by RT PCR NEGATIVE NEGATIVE Final    Comment: (NOTE) SARS-CoV-2 target nucleic acids are NOT DETECTED.  The SARS-CoV-2 RNA is generally detectable in upper respiratory specimens during the acute phase of infection. The lowest concentration of SARS-CoV-2 viral copies this assay can detect is 138 copies/mL. A negative result does not preclude SARS-Cov-2 infection and should not be used as the sole basis for treatment or other patient management decisions. A negative result may occur with  improper specimen collection/handling, submission of specimen other than nasopharyngeal swab, presence of viral mutation(s) within the areas targeted by this assay, and inadequate number of viral copies(<138 copies/mL). A negative result must be combined with clinical observations, patient history, and epidemiological information. The expected result is Negative.  Fact Sheet for Patients:  EntrepreneurPulse.com.au  Fact Sheet for Healthcare Providers:  IncredibleEmployment.be  This test is no t yet approved or cleared by the [place] FDA and   has been authorized for detection and/or diagnosis of SARS-CoV-2 by FDA under an Emergency Use Authorization (EUA). This EUA will remain  in effect (meaning this test can be used) for the duration of the COVID-19  declaration under Section 564(b)(1) of the Act, 21 U.S.C.section 360bbb-3(b)(1), unless the authorization is terminated  or revoked sooner.       Influenza A by PCR NEGATIVE NEGATIVE Final   Influenza B by PCR NEGATIVE NEGATIVE Final    Comment: (NOTE) The Xpert Xpress SARS-CoV-2/FLU/RSV plus assay is intended as an aid in the diagnosis of influenza from Nasopharyngeal swab specimens and should not be used as a sole basis for treatment. Nasal washings and aspirates are unacceptable for Xpert Xpress SARS-CoV-2/FLU/RSV testing.  Fact Sheet for Patients: EntrepreneurPulse.com.au  Fact Sheet for Healthcare Providers: IncredibleEmployment.be  This test is not yet approved or cleared by the [place] FDA and has been authorized for detection and/or diagnosis of SARS-CoV-2 by FDA under an Emergency Use Authorization (EUA). This EUA will remain in effect (meaning this test can be used) for the duration of the COVID-19 declaration under Section 564(b)(1) of the Act, 21 U.S.C. section 360bbb-3(b)(1), unless the authorization is terminated or revoked.  Performed at [facility], [place] [address], [place], [place] [address]   MRSA Next Gen by PCR, Nasal     Status: Abnormal   Collection Time: 12/18/20  6:51 AM   Specimen: Nasal Mucosa; Nasal Swab  Result Value Ref Range Status   MRSA by PCR Next Gen DETECTED (A) NOT DETECTED Final    Comment: RESULT CALLED TO, READ BACK BY AND VERIFIED WITH: JOE RN ON 12/18/20 AT 1149 BY GOLSONM Performed at [facility] [address] [place], [place] [address]   Resp Panel by RT-PCR (Flu A&B, Covid) Nasopharyngeal Swab     Status: None   Collection Time:  12/23/20 11:50 AM   Specimen: Nasopharyngeal Swab; Nasopharyngeal(NP) swabs in vial transport medium  Result Value Ref Range Status   SARS Coronavirus 2 by RT PCR NEGATIVE NEGATIVE Final    Comment: (NOTE) SARS-CoV-2 target nucleic acids are NOT DETECTED.  The SARS-CoV-2 RNA is generally detectable in upper respiratory specimens during the acute phase of infection. The lowest concentration of SARS-CoV-2 viral copies this assay can detect is 138 copies/mL. A negative result does not preclude SARS-Cov-2 infection and should not be used as the sole basis for treatment or other patient management decisions. A negative result may occur with  improper specimen collection/handling, submission of specimen other than nasopharyngeal swab, presence of viral mutation(s) within the areas targeted by this assay, and inadequate number of viral copies(<138 copies/mL). A negative result must be combined with clinical observations, patient history, and epidemiological information. The expected result is Negative.  Fact Sheet for Patients:  EntrepreneurPulse.com.au  Fact Sheet for Healthcare Providers:  IncredibleEmployment.be  This test is no t yet approved or cleared by the [place] FDA and  has been authorized for detection and/or diagnosis of SARS-CoV-2 by FDA under an Emergency Use Authorization (EUA). This EUA will remain  in effect (meaning this test can be used) for the duration of the COVID-19 declaration under Section 564(b)(1) of the Act, 21 U.S.C.section 360bbb-3(b)(1), unless the authorization is terminated  or revoked sooner.       Influenza A by PCR NEGATIVE NEGATIVE Final   Influenza B by PCR NEGATIVE NEGATIVE Final    Comment: (NOTE) The Xpert Xpress SARS-CoV-2/FLU/RSV plus assay is intended as an aid in the diagnosis of influenza from Nasopharyngeal swab specimens and should not be used as a sole basis for treatment. Nasal washings  and aspirates are unacceptable for Xpert Xpress SARS-CoV-2/FLU/RSV testing.  Fact Sheet for Patients:  EntrepreneurPulse.com.au  Fact Sheet for Healthcare Providers: IncredibleEmployment.be  This test is not yet approved or cleared by the [place] FDA and has been authorized for detection and/or diagnosis of SARS-CoV-2 by FDA under an Emergency Use Authorization (EUA). This EUA will remain in effect (meaning this test can be used) for the duration of the COVID-19 declaration under Section 564(b)(1) of the Act, 21 U.S.C. section 360bbb-3(b)(1), unless the authorization is terminated or revoked.  Performed at [facility], [place] [address], [place], [place] [address]     Labs: BNP (last 3 results) Recent Labs    05/13/20 0120  BNP 3,810.1*   Basic Metabolic Panel: Recent Labs  Lab 12/17/20 1200 12/18/20 0543 12/20/20 0511 12/22/20 0504 12/23/20 0513  NA 140 140 136 136 137  K 4.3 4.3 3.8 4.2 4.0  CL 114* 116* 111 110 112*  CO2 18* 20* 19* 19* 18*  GLUCOSE 88 100* 103* 94 97  BUN 63* 54* 33* 37* 42*  CREATININE 1.78* 1.65* 1.50* 1.24 1.38*  CALCIUM 8.8* 8.5* 7.6* 7.8* 7.9*  MG  --   --   --   --  1.5*  PHOS  --   --   --   --  2.3*   Liver Function Tests: Recent Labs  Lab 12/16/20 1644 12/23/20 0513  AST 22 21  ALT 25 20  ALKPHOS 36* 30*  BILITOT 0.4 0.6  PROT 5.9* 4.8*  ALBUMIN 2.8* 2.3*   No results for input(s): LIPASE, AMYLASE in the last 168 hours. No results for input(s): AMMONIA in the last 168 hours. CBC: Recent Labs  Lab 12/16/20 1644 12/16/20 1654 12/19/20 0857 12/19/20 1529 12/20/20 0511 12/21/20 0520 12/22/20 0504 12/23/20 0513  WBC 9.9   < > 9.7  --  8.0 7.4 7.7 7.5  NEUTROABS 7.2  --   --   --   --   --   --  5.2  HGB 8.2*   < > 7.1* 8.3* 7.6* 7.4* 7.5* 7.8*  HCT 26.3*   < > 22.0* 24.4* 23.7* 23.7* 23.6* 23.8*  MCV 110.5*   < > 102.8*  --  102.2* 104.9* 106.8* 103.0*  PLT  226   < > 150  --  147* 125* 129* 142*   < > = values in this interval not displayed.   Cardiac Enzymes: No results for input(s): CKTOTAL, CKMB, CKMBINDEX, TROPONINI in the last 168 hours. BNP: Invalid input(s): POCBNP CBG: No results for input(s): GLUCAP in the last 168 hours. D-Dimer No results for input(s): DDIMER in the last 72 hours. Hgb A1c No results for input(s): HGBA1C in the last 72 hours. Lipid Profile No results for input(s): CHOL, HDL, LDLCALC, TRIG, CHOLHDL, LDLDIRECT in the last 72 hours. Thyroid function studies No results for input(s): TSH, T4TOTAL, T3FREE, THYROIDAB in the last 72 hours.  Invalid input(s): FREET3 Anemia work up No results for input(s): VITAMINB12, FOLATE, FERRITIN, TIBC, IRON, RETICCTPCT in the last 72 hours. Urinalysis    Component Value Date/Time   COLORURINE YELLOW 10/09/2020 1800   APPEARANCEUR CLEAR 10/09/2020 1800   LABSPEC 1.016 10/09/2020 1800   LABSPEC 1.030 04/11/2007 1446   PHURINE 5.0 10/09/2020 1800   GLUCOSEU NEGATIVE 10/09/2020 1800   HGBUR NEGATIVE 10/09/2020 1800   BILIRUBINUR NEGATIVE 10/09/2020 1800   BILIRUBINUR Negative 04/11/2007 1446   KETONESUR NEGATIVE 10/09/2020 1800   PROTEINUR NEGATIVE 10/09/2020 1800   UROBILINOGEN 0.2 06/10/2014 1303   NITRITE NEGATIVE 10/09/2020 1800   LEUKOCYTESUR NEGATIVE 10/09/2020  1800   LEUKOCYTESUR Negative 04/11/2007 1446   Sepsis Labs Invalid input(s): PROCALCITONIN,  WBC,  LACTICIDVEN Microbiology Recent Results (from the past 240 hour(s))  Resp Panel by RT-PCR (Flu A&B, Covid) Nasopharyngeal Swab     Status: None   Collection Time: 12/17/20 12:17 AM   Specimen: Nasopharyngeal Swab; Nasopharyngeal(NP) swabs in vial transport medium  Result Value Ref Range Status   SARS Coronavirus 2 by RT PCR NEGATIVE NEGATIVE Final    Comment: (NOTE) SARS-CoV-2 target nucleic acids are NOT DETECTED.  The SARS-CoV-2 RNA is generally detectable in upper respiratory specimens during the acute  phase of infection. The lowest concentration of SARS-CoV-2 viral copies this assay can detect is 138 copies/mL. A negative result does not preclude SARS-Cov-2 infection and should not be used as the sole basis for treatment or other patient management decisions. A negative result may occur with  improper specimen collection/handling, submission of specimen other than nasopharyngeal swab, presence of viral mutation(s) within the areas targeted by this assay, and inadequate number of viral copies(<138 copies/mL). A negative result must be combined with clinical observations, patient history, and epidemiological information. The expected result is Negative.  Fact Sheet for Patients:  EntrepreneurPulse.com.au  Fact Sheet for Healthcare Providers:  IncredibleEmployment.be  This test is no t yet approved or cleared by the [place] FDA and  has been authorized for detection and/or diagnosis of SARS-CoV-2 by FDA under an Emergency Use Authorization (EUA). This EUA will remain  in effect (meaning this test can be used) for the duration of the COVID-19 declaration under Section 564(b)(1) of the Act, 21 U.S.C.section 360bbb-3(b)(1), unless the authorization is terminated  or revoked sooner.       Influenza A by PCR NEGATIVE NEGATIVE Final   Influenza B by PCR NEGATIVE NEGATIVE Final    Comment: (NOTE) The Xpert Xpress SARS-CoV-2/FLU/RSV plus assay is intended as an aid in the diagnosis of influenza from Nasopharyngeal swab specimens and should not be used as a sole basis for treatment. Nasal washings and aspirates are unacceptable for Xpert Xpress SARS-CoV-2/FLU/RSV testing.  Fact Sheet for Patients: EntrepreneurPulse.com.au  Fact Sheet for Healthcare Providers: IncredibleEmployment.be  This test is not yet approved or cleared by the [place] FDA and has been authorized for detection and/or diagnosis of  SARS-CoV-2 by FDA under an Emergency Use Authorization (EUA). This EUA will remain in effect (meaning this test can be used) for the duration of the COVID-19 declaration under Section 564(b)(1) of the Act, 21 U.S.C. section 360bbb-3(b)(1), unless the authorization is terminated or revoked.  Performed at [facility], [place] [address] [place], [place] [address]   MRSA Next Gen by PCR, Nasal     Status: Abnormal   Collection Time: 12/18/20  6:51 AM   Specimen: Nasal Mucosa; Nasal Swab  Result Value Ref Range Status   MRSA by PCR Next Gen DETECTED (A) NOT DETECTED Final    Comment: RESULT CALLED TO, READ BACK BY AND VERIFIED WITH: JOE RN ON 12/18/20 AT 1149 BY GOLSONM Performed at [facility] [address], [place], [place] [address]   Resp Panel by RT-PCR (Flu A&B, Covid) Nasopharyngeal Swab     Status: None   Collection Time: 12/23/20 11:50 AM   Specimen: Nasopharyngeal Swab; Nasopharyngeal(NP) swabs in vial transport medium  Result Value Ref Range Status   SARS Coronavirus 2 by RT PCR NEGATIVE NEGATIVE Final    Comment: (NOTE) SARS-CoV-2 target nucleic acids are NOT DETECTED.  The SARS-CoV-2 RNA  is generally detectable in upper respiratory specimens during the acute phase of infection. The lowest concentration of SARS-CoV-2 viral copies this assay can detect is 138 copies/mL. A negative result does not preclude SARS-Cov-2 infection and should not be used as the sole basis for treatment or other patient management decisions. A negative result may occur with  improper specimen collection/handling, submission of specimen other than nasopharyngeal swab, presence of viral mutation(s) within the areas targeted by this assay, and inadequate number of viral copies(<138 copies/mL). A negative result must be combined with clinical observations, patient history, and epidemiological information. The expected result is Negative.  Fact Sheet for  Patients:  EntrepreneurPulse.com.au  Fact Sheet for Healthcare Providers:  IncredibleEmployment.be  This test is no t yet approved or cleared by the [place] FDA and  has been authorized for detection and/or diagnosis of SARS-CoV-2 by FDA under an Emergency Use Authorization (EUA). This EUA will remain  in effect (meaning this test can be used) for the duration of the COVID-19 declaration under Section 564(b)(1) of the Act, 21 U.S.C.section 360bbb-3(b)(1), unless the authorization is terminated  or revoked sooner.       Influenza A by PCR NEGATIVE NEGATIVE Final   Influenza B by PCR NEGATIVE NEGATIVE Final    Comment: (NOTE) The Xpert Xpress SARS-CoV-2/FLU/RSV plus assay is intended as an aid in the diagnosis of influenza from Nasopharyngeal swab specimens and should not be used as a sole basis for treatment. Nasal washings and aspirates are unacceptable for Xpert Xpress SARS-CoV-2/FLU/RSV testing.  Fact Sheet for Patients: EntrepreneurPulse.com.au  Fact Sheet for Healthcare Providers: IncredibleEmployment.be  This test is not yet approved or cleared by the [place] FDA and has been authorized for detection and/or diagnosis of SARS-CoV-2 by FDA under an Emergency Use Authorization (EUA). This EUA will remain in effect (meaning this test can be used) for the duration of the COVID-19 declaration under Section 564(b)(1) of the Act, 21 U.S.C. section 360bbb-3(b)(1), unless the authorization is terminated or revoked.  Performed at [facility], [place] [address], [place], [place] [address]    Time coordinating discharge: 35 minutes  SIGNED:  Kerney Elbe, DO [facility] 12/23/2020, 1:00 PM Pager is on [place]  If 7PM-7AM, please contact night-coverage [email or web address]

## 2020-12-24 ENCOUNTER — Non-Acute Institutional Stay: Payer: Medicare Other | Admitting: Hospice

## 2020-12-24 ENCOUNTER — Other Ambulatory Visit: Payer: Self-pay

## 2020-12-24 DIAGNOSIS — L89154 Pressure ulcer of sacral region, stage 4: Secondary | ICD-10-CM

## 2020-12-24 DIAGNOSIS — R532 Functional quadriplegia: Secondary | ICD-10-CM

## 2020-12-24 DIAGNOSIS — Z515 Encounter for palliative care: Secondary | ICD-10-CM

## 2020-12-24 DIAGNOSIS — E43 Unspecified severe protein-calorie malnutrition: Secondary | ICD-10-CM

## 2020-12-24 NOTE — Progress Notes (Signed)
[facility] Palliative Care Consult Note Telephone: [phone]  Fax: [phone]  PATIENT NAME: [name] [name] [address] [place] [address] [phone] (home)  DOB: May 30, 1960 MRN: [id number]  PRIMARY CARE PROVIDER:    [name], MD,  [place] [address] [phone]  REFERRING PROVIDER:   Martinique Miller NP  RESPONSIBLE PARTY:   [place]     Name Relation Home Work Mobile   [name] [name] Sister [phone]     [name] [name] [phone]          I met face to face with patient at facility. Palliative Care was asked to follow this patient by consultation request of [name] Miller NP to address advance care planning, complex medical decision making and goals of care clarification. NP called [name] [name] and updated him on patient's status. She provided some needed information/history as is a poor historian. Patient endorsed palliative service. This is the initial visit.    ASSESSMENT AND / RECOMMENDATIONS:   Advance Care Planning: Our advance care planning conversation included a discussion about:    The value and importance of advance care planning  Difference between Hospice and Palliative care Exploration of goals of care in the event of a sudden injury or illness  Identification and preparation of a healthcare agent  Review and updating or creation of an  advance directive document . Decision not to resuscitate or to de-escalate disease focused treatments due to poor prognosis.  CODE STATUS: Patient is a Do Not Resuscitate. Signed DNR form in facility chart; same document uploaded to Epic today.  Goals of Care: Goals include to maximize quality of life and symptom management. [name] [name] said family is interested in hospice service in the future but not quite there at this time.   I spent 16 minutes providing this initial consultation. More than 50% of the time in  this consultation was spent on counseling patient and coordinating communication. --------------------------------------------------------------------------------------------------------------------------------------  Symptom Management/Plan: Protein Caloric Malnutrition: Severe with ongoing weight loss.  Patient is on Protostat for protein augmentation.  Recommendation: Initiate Mirtazapine 7.5 mg tab p.o at bedtime to help boost appetite. Provide assistance during meals to ensure adequate oral intake/hydration. Nutritionist consult as needed.  Routine CBC CMP, Mag, Phos in a week per hospital discharge instructions.  Functional Quadriplegia: Patient will benefit from OT and restorative exercises.  HIV/AIDS: Managed with [facility] and [facility],  Stage 4 Sacral decubitus ulcer: Was recently hospitalized because it was thought patient had GI bleed; it was decided bleeding probably from wound.  No bleeding currently.  Followed by facility wound nurse. Continue [place] honey sheets and dressing changes as ordered. Air mattress is in place. Encouraged repositioning to alleviate pressure.  Hospital report indicates GI recommended consideration of hospice. Follow up: Palliative care will continue to follow for complex medical decision making, advance care planning, and clarification of goals. Return 6 weeks or prn.Encouraged to call provider sooner with any concerns.   Family /Caregiver/Community Supports: Patient in SNF for ongoing care.  HOSPICE ELIGIBILITY/DIAGNOSIS: TBD  Chief Complaint: Initial Palliative care visit  HISTORY OF PRESENT ILLNESS:  [name] [name] is a 59 y.o. year old male  with multiple medical conditions including Protein caloric malnutrition which is worsening with ongoing weight loss, chronic skin breakdown-sacral decubitus ulcer, with occasional bleeding.  Patient is a total care, poor historian due to cognitive deficits.  He currently has an air mattress which he thinks is  helpful. Patient reports ongoing weakness; he is a functional quadriplegia.  History of HIV/AIDS, stage IV sacral decubitus ulcer, metabolic encephalopathy.  He denies pain/discomfort.  Nursing with no concerns currently.  Earlier collaborative discussion with [name] Miller, NP - in agreement patient would become hospice eligible if he continues to decline. History obtained from review of EMR, discussion with primary team, caregiver, family and/or [name] [name].  Review and summarization of Epic records shows history from other than patient. Rest of 10 point ROS asked and negative.  I reviewed as needed, available labs, patient records, imaging, studies and related documents from the EMR.  Physical Exam: Height/Weight: 6 feet 2 inches/123 Ibs BMI 15.75 down from 135 Ibs 10/14/2020 Constitutional: NAD General: Well groomed, cooperative EYES: anicteric sclera, lids intact, no discharge  ENMT: Moist mucous membrane CV: S1 S2, RRR, no LE edema Pulmonary: LCTA, no increased work of breathing, no cough, Abdomen: active BS + 4 quadrants, soft and non tender GU: no suprapubic tenderness; foley cath in place, clear light yellow urine in bag MSK: weakness, sarcopenia, limited ROM, prevalon booth to bil feet Skin: warm and dry, dressing to sacrum clean dry and intact Neuro:  weakness, otherwise non focal Psych: non-anxious affect Hem/lymph/immuno: no widespread bruising   PAST MEDICAL HISTORY:  Active Ambulatory Problems    Diagnosis Date Noted   Human immunodeficiency virus (HIV) disease ([place]) 10/19/2005   MOLLUSCUM CONTAGIOSUM 10/19/2005   Thrush of mouth and esophagus ([place]) 02/16/2006   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   GOUT 10/19/2005   ANEMIA, CHRONIC 03/20/2007   Acute thromboembolism of deep veins of lower extremity ([place]) 08/22/2006   ABSCESS, RECTUM 04/11/2006   Urethral stricture 09/05/2007   DIARRHEA 02/16/2006   HEPATITIS B, HX OF 10/19/2005   HERPES ZOSTER, HX OF  10/19/2005   Unspecified severe protein-calorie malnutrition ([place]) 02/14/2007   Enlargement of lymph nodes 01/13/2013   Inguinal adenopathy 02/18/2013   Pain in limb 07/23/2013   Septic arthritis of hip ([place]) 07/31/2013   Sacral decubitus ulcer, stage IV (HCC) 07/31/2013   Protein-calorie malnutrition, severe ([place]) 07/31/2013   Renal failure, chronic 06/10/2014   Poor dentition 01/16/2017   Rectal bleeding 02/20/2020   Abnormal digital rectal exam 02/20/2020   History of anal cancer 02/20/2020   AKI (acute kidney injury) ([place]) 05/13/2020   Lactic acidosis 05/13/2020   SDH (subdural hematoma) 05/13/2020   Hypothermia due to exposure 05/13/2020   Septic shock (HCC) 05/13/2020   Cellulitis 05/13/2020   Elevated brain natriuretic peptide (BNP) level 05/13/2020   Volume overload 05/13/2020   Dehydration 05/13/2020   Decubitus ulcer 05/13/2020   Subdural empyema    Streptococcal bacteremia    Malnutrition of moderate degree 05/14/2020   Onychomycosis 08/19/2020   Penile ulcer 09/32/3557   Acute metabolic encephalopathy 32/20/2542   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   Chronic diastolic CHF (congestive heart failure) ([place]) 10/09/2020   Anemia 10/09/2020   Rigidity 10/09/2020   Hypernatremia 10/09/2020   Functional quadriplegia ([place]) 10/09/2020   Pressure injury of skin 10/10/2020   Acute encephalopathy 10/10/2020   Encephalopathy    Palliative care encounter 12/09/2020   Ileus ([place]) 12/09/2020   GIB (gastrointestinal bleeding) 12/16/2020   Bleeding from wound 12/19/2020   ABLA (acute blood loss anemia) 12/19/2020   Acute urinary retention 12/19/2020   FTT (failure to thrive) in adult 12/19/2020   Thrombocytopenia ([place]) 12/21/2020   Resolved Ambulatory Problems    Diagnosis Date Noted   TOBACCO USER 02/16/2006   Dental  caries 03/20/2007   Septic shock(785.52) 08/03/2013   S/P right TH revision 06/15/2014   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   Past Medical  History:  Diagnosis Date   Arthritis    Decubitus ulcer of sacral area    DVT (deep venous thrombosis) ([place]) 2008   Exposure to hepatitis B    H/O hypotension    History of anemia    History of gout    History of transfusion    History of urinary retention    HIV (human immunodeficiency virus infection) ([place])    Hx of sepsis 2015   PONV (postoperative nausea and vomiting)    Radiation    Rectal cancer (HCC)    Shingles     SOCIAL HX:  Social History   Tobacco Use   Smoking status: Former    Packs/day: 0.30    Types: Cigarettes   Smokeless tobacco: Never  Substance Use Topics   Alcohol use: Yes    Alcohol/week: 3.0 standard drinks    Types: 3 Standard drinks or equivalent per week     FAMILY HX:  Family History  Problem Relation Age of Onset   Diabetes Mother    Dementia Mother    Atrial fibrillation Mother    Hypertension Mother    Cancer Father    Emphysema Father       ALLERGIES:  Allergies  Allergen Reactions   Collagen Rash    redness      PERTINENT MEDICATIONS:  Outpatient Encounter Medications as of 12/24/2020  Medication Sig   acetaminophen (TYLENOL) 325 MG tablet Take 2 tablets (650 mg total) by mouth every 6 (six) hours as needed for mild pain (or Fever >/= 101).   Amino Acids-Protein Hydrolys (FEEDING SUPPLEMENT, PRO-STAT SUGAR FREE 64,) LIQD Take 30 mLs by mouth 3 (three) times daily.   apixaban (ELIQUIS) 2.5 MG TABS tablet Take 1 tablet (2.5 mg total) by mouth 2 (two) times daily.   bisacodyl (DULCOLAX) 10 MG suppository Place 10 mg rectally once as needed for moderate constipation.   cyanocobalamin 100 MCG tablet Take 100 mcg by mouth daily.   darunavir-cobicistat (PREZCOBIX) 800-150 MG tablet TAKE 1 TABLET BY MOUTH DAILY . SWALLOW WHOLE, DO NOT CRUSH, BREAK, OR CHEW TABLETS. TAKE WITH FOOD (Patient taking differently: Take 1 tablet by mouth daily.)   famotidine (PEPCID) 20 MG tablet Take 1 tablet (20 mg total) by mouth daily.   feeding  supplement (ENSURE ENLIVE / ENSURE PLUS) LIQD Take 237 mLs by mouth daily.   loperamide (IMODIUM A-D) 2 MG tablet Take 2-4 mg by mouth as needed for diarrhea or loose stools (max dose of 67m/24 hours). 460mafter 1st loose stool and 31m74mfter each subsequent BM   nutrition supplement, JUVEN, (JUVEN) PACK Take 1 packet by mouth 2 (two) times daily between meals.   Nutritional Supplements (,FEEDING SUPPLEMENT, PROSOURCE PLUS) liquid Take 30 mLs by mouth 2 (two) times daily between meals.   ondansetron (ZOFRAN) 4 MG tablet Take 1 tablet (4 mg total) by mouth every 6 (six) hours as needed for nausea.   oxycodone (OXY-IR) 5 MG capsule Take 1 capsule (5 mg total) by mouth See admin instructions. Give 5mg10m mouth daily for wound care. May also give 5mg 9mry 6 hours as needed for chronic pain   polycarbophil (FIBERCON) 625 MG tablet Take 625 mg by mouth daily.   rilpivirine (EDURANT) 25 MG TABS tablet TAKE 1 TABLET (25 MG TOTAL) BY MOUTH DAILY WITH BREAKFAST (Patient taking  differently: Take 25 mg by mouth daily with breakfast.)   simethicone (MYLICON) 80 MG chewable tablet Chew 80 mg by mouth with breakfast, with lunch, and with evening meal.   sodium bicarbonate 650 MG tablet Take 1 tablet (650 mg total) by mouth 3 (three) times daily for 1 day.   TIVICAY 50 MG tablet TAKE 1 TABLET (50 MG TOTAL) BY MOUTH DAILY (Patient taking differently: Take 50 mg by mouth daily.)   vitamin C (ASCORBIC ACID) 500 MG tablet Take 1,000 mg by mouth daily.   Zinc Oxide (TRIPLE PASTE) 12.8 % ointment Apply topically 3 (three) times daily.   zinc sulfate 220 (50 Zn) MG capsule Take 1 capsule (220 mg total) by mouth daily.   No facility-administered encounter medications on file as of 12/24/2020.     Thank you for the opportunity to participate in the care of Mr. Patient.  The palliative care team will continue to follow. Please call our office at [phone] if we can be of additional assistance.   Note: Portions of  this note were generated with [employer]. Dictation errors may occur despite best attempts at proofreading.  Teodoro Spray, NP

## 2021-01-07 ENCOUNTER — Other Ambulatory Visit: Payer: Self-pay

## 2021-01-07 ENCOUNTER — Non-Acute Institutional Stay: Payer: Medicare Other | Admitting: Hospice

## 2021-01-07 DIAGNOSIS — Z515 Encounter for palliative care: Secondary | ICD-10-CM

## 2021-01-07 DIAGNOSIS — E43 Unspecified severe protein-calorie malnutrition: Secondary | ICD-10-CM

## 2021-01-07 DIAGNOSIS — R532 Functional quadriplegia: Secondary | ICD-10-CM

## 2021-01-07 DIAGNOSIS — L89154 Pressure ulcer of sacral region, stage 4: Secondary | ICD-10-CM

## 2021-01-07 NOTE — Progress Notes (Signed)
[facility] Palliative Care Consult Note Telephone: [phone]  Fax: [phone]  PATIENT NAME: [name] [name] [address] [place] [address] [phone] (home)  DOB: 05-04-1960 MRN: [id number]  PRIMARY CARE PROVIDER:    [name], MD,  [place] [address] [phone]  REFERRING PROVIDER:   Martinique Miller NP  RESPONSIBLE PARTY:   [place]     Name Relation Home Work Mobile   [place] Sister [phone]     Fonnie Jarvis [phone]         I met face to face with patient at facility. Palliative Care was asked to follow this patient by consultation request of Martinique Miller NP to address advance care planning, complex medical decision making and goals of care clarification. This is a follow up visit.    ASSESSMENT AND / RECOMMENDATIONS:   CODE STATUS: Patient is a Do Not Resuscitate.   Goals of Care: Goals include to maximize quality of life and symptom management. [name] [name] said family is interested in hospice service in the future but not quite there at this time.   Symptom Management/Plan: Stage 4 Sacral decubitus ulcer: Facility Wound NP plans for a general surgery consult for a possible skin flap. Continue [place] honey sheets and dressing changes as ordered. Air mattress is in place. Encouraged repositioning to alleviate pressure.  Hospital report indicates GI recommended consideration of hospice. Protein Caloric Malnutrition:  has gained 2 Ibs in 2 weeks. Continue Protostat for protein augmentation. Recommendation: Initiate Mirtazapine 7.5 mg tab p.o at bedtime to help boost appetite. Provide assistance during meals to ensure adequate oral intake/hydration. Nutritionist consult as needed.  Functional Quadriplegia: Patient will benefit from OT and restorative exercises, ROM exercises from nursing. HIV/AIDS: Managed with [facility] and [facility],   Follow up: Palliative care will continue to follow for complex medical decision making, advance care planning, and clarification of goals. Return 6 weeks or prn.Encouraged to call provider sooner with any concerns.   Family /Caregiver/Community Supports: Patient in SNF for ongoing care.  HOSPICE ELIGIBILITY/DIAGNOSIS: TBD  Chief Complaint: Initial Palliative care visit  HISTORY OF PRESENT ILLNESS:  [name] [name] is a 60 y.o. year old male  with multiple medical conditions including Protein caloric malnutrition, chronic skin breakdown-sacral decubitus ulcer, with occasional bleeding in the past.  Patient is a total care, poor historian due to cognitive deficits.  He currently has an air mattress. History of functional quadriplegia, HIV/AIDS, stage IV sacral decubitus ulcer, metabolic encephalopathy.  He denies pain/discomfort.  NP discussed the plan for general surgery consult pending, to continue with current honey dressing changes and repositioning. Nursing with no other concerns currently.  Earlier collaborative discussion with Martinique Miller, NP - in agreement patient would become hospice eligible if he continues to decline. History obtained from review of EMR, discussion with primary team, caregiver, family and/or [name] [name].  Review and summarization of Epic records shows history from other than patient. Rest of 10 point ROS asked and negative.  I reviewed as needed, available labs, patient records, imaging, studies and related documents from the EMR.  Physical Exam: Height/Weight: 6 feet 2 inches/125 Ibs down from 135 Ibs 10/14/2020 Constitutional: NAD General: Well groomed, cooperative EYES: anicteric sclera, lids intact, no discharge  ENMT: Moist mucous membrane CV: S1 S2, RRR, no LE edema Pulmonary: LCTA, no increased work of breathing, no cough, Abdomen: active BS + 4 quadrants, soft and non tender GU: no  suprapubic tenderness; foley cath in place, clear light yellow urine in  bag MSK: weakness, sarcopenia, limited ROM, prevalon booth to bil feet Skin: warm and dry, dressing to sacrum clean dry and intact Neuro:  weakness, otherwise non focal Psych: non-anxious affect Hem/lymph/immuno: no widespread bruising   PAST MEDICAL HISTORY:  Active Ambulatory Problems    Diagnosis Date Noted   Human immunodeficiency virus (HIV) disease ([place]) 10/19/2005   MOLLUSCUM CONTAGIOSUM 10/19/2005   Thrush of mouth and esophagus ([place]) 02/16/2006   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   GOUT 10/19/2005   ANEMIA, CHRONIC 03/20/2007   Acute thromboembolism of deep veins of lower extremity ([place]) 08/22/2006   ABSCESS, RECTUM 04/11/2006   Urethral stricture 09/05/2007   DIARRHEA 02/16/2006   HEPATITIS B, HX OF 10/19/2005   HERPES ZOSTER, HX OF 10/19/2005   Unspecified severe protein-calorie malnutrition ([place]) 02/14/2007   Enlargement of lymph nodes 01/13/2013   Inguinal adenopathy 02/18/2013   Pain in limb 07/23/2013   Septic arthritis of hip ([place]) 07/31/2013   Sacral decubitus ulcer, stage IV ([place]) 07/31/2013   Protein-calorie malnutrition, severe ([place]) 07/31/2013   Renal failure, chronic 06/10/2014   Poor dentition 01/16/2017   Rectal bleeding 02/20/2020   Abnormal digital rectal exam 02/20/2020   History of anal cancer 02/20/2020   AKI (acute kidney injury) ([facility]) 05/13/2020   Lactic acidosis 05/13/2020   SDH (subdural hematoma) 05/13/2020   Hypothermia due to exposure 05/13/2020   Septic shock ([place]) 05/13/2020   Cellulitis 05/13/2020   Elevated brain natriuretic peptide (BNP) level 05/13/2020   Volume overload 05/13/2020   Dehydration 05/13/2020   Decubitus ulcer 05/13/2020   Subdural empyema    Streptococcal bacteremia    Malnutrition of moderate degree 05/14/2020   Onychomycosis 08/19/2020   Penile ulcer 76/73/4193   Acute metabolic encephalopathy 79/02/4095   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   Chronic diastolic CHF (congestive heart failure)  ([place]) 10/09/2020   Anemia 10/09/2020   Rigidity 10/09/2020   Hypernatremia 10/09/2020   Functional quadriplegia ([place]) 10/09/2020   Pressure injury of skin 10/10/2020   Acute encephalopathy 10/10/2020   Encephalopathy    Palliative care encounter 12/09/2020   Ileus ([place]) 12/09/2020   GIB (gastrointestinal bleeding) 12/16/2020   Bleeding from wound 12/19/2020   ABLA (acute blood loss anemia) 12/19/2020   Acute urinary retention 12/19/2020   FTT (failure to thrive) in adult 12/19/2020   Thrombocytopenia ([place]) 12/21/2020   Resolved Ambulatory Problems    Diagnosis Date Noted   TOBACCO USER 02/16/2006   Dental caries 03/20/2007   Septic shock(785.52) 08/03/2013   S/P right TH revision 06/15/2014   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   Past Medical History:  Diagnosis Date   Arthritis    Decubitus ulcer of sacral area    DVT (deep venous thrombosis) ([facility]) 2008   Exposure to hepatitis B    H/O hypotension    History of anemia    History of gout    History of transfusion    History of urinary retention    HIV (human immunodeficiency virus infection) ([place])    Hx of sepsis 2015   PONV (postoperative nausea and vomiting)    Radiation    Rectal cancer ([facility])    Shingles     SOCIAL HX:  Social History   Tobacco Use   Smoking status: Former    Packs/day: 0.30    Types: Cigarettes   Smokeless tobacco: Never  Substance Use Topics   Alcohol use: Yes  Alcohol/week: 3.0 standard drinks    Types: 3 Standard drinks or equivalent per week     FAMILY HX:  Family History  Problem Relation Age of Onset   Diabetes Mother    Dementia Mother    Atrial fibrillation Mother    Hypertension Mother    Cancer Father    Emphysema Father       ALLERGIES:  Allergies  Allergen Reactions   Collagen Rash    redness      PERTINENT MEDICATIONS:  Outpatient Encounter Medications as of 01/07/2021  Medication Sig   acetaminophen (TYLENOL) 325 MG tablet Take 2 tablets (650 mg  total) by mouth every 6 (six) hours as needed for mild pain (or Fever >/= 101).   Amino Acids-Protein Hydrolys (FEEDING SUPPLEMENT, PRO-STAT SUGAR FREE 64,) LIQD Take 30 mLs by mouth 3 (three) times daily.   apixaban (ELIQUIS) 2.5 MG TABS tablet Take 1 tablet (2.5 mg total) by mouth 2 (two) times daily.   bisacodyl (DULCOLAX) 10 MG suppository Place 10 mg rectally once as needed for moderate constipation.   cyanocobalamin 100 MCG tablet Take 100 mcg by mouth daily.   darunavir-cobicistat (PREZCOBIX) 800-150 MG tablet TAKE 1 TABLET BY MOUTH DAILY . SWALLOW WHOLE, DO NOT CRUSH, BREAK, OR CHEW TABLETS. TAKE WITH FOOD (Patient taking differently: Take 1 tablet by mouth daily.)   famotidine (PEPCID) 20 MG tablet Take 1 tablet (20 mg total) by mouth daily.   feeding supplement (ENSURE ENLIVE / ENSURE PLUS) LIQD Take 237 mLs by mouth daily.   loperamide (IMODIUM A-D) 2 MG tablet Take 2-4 mg by mouth as needed for diarrhea or loose stools (max dose of 17m/24 hours). 483mafter 1st loose stool and 11m55mfter each subsequent BM   nutrition supplement, JUVEN, (JUVEN) PACK Take 1 packet by mouth 2 (two) times daily between meals.   Nutritional Supplements (,FEEDING SUPPLEMENT, PROSOURCE PLUS) liquid Take 30 mLs by mouth 2 (two) times daily between meals.   ondansetron (ZOFRAN) 4 MG tablet Take 1 tablet (4 mg total) by mouth every 6 (six) hours as needed for nausea.   oxycodone (OXY-IR) 5 MG capsule Take 1 capsule (5 mg total) by mouth See admin instructions. Give 5mg44m mouth daily for wound care. May also give 5mg 411mry 6 hours as needed for chronic pain   polycarbophil (FIBERCON) 625 MG tablet Take 625 mg by mouth daily.   rilpivirine (EDURANT) 25 MG TABS tablet TAKE 1 TABLET (25 MG TOTAL) BY MOUTH DAILY WITH BREAKFAST (Patient taking differently: Take 25 mg by mouth daily with breakfast.)   simethicone (MYLICON) 80 MG chewable tablet Chew 80 mg by mouth with breakfast, with lunch, and with evening meal.    TIVICAY 50 MG tablet TAKE 1 TABLET (50 MG TOTAL) BY MOUTH DAILY (Patient taking differently: Take 50 mg by mouth daily.)   vitamin C (ASCORBIC ACID) 500 MG tablet Take 1,000 mg by mouth daily.   Zinc Oxide (TRIPLE PASTE) 12.8 % ointment Apply topically 3 (three) times daily.   zinc sulfate 220 (50 Zn) MG capsule Take 1 capsule (220 mg total) by mouth daily.   No facility-administered encounter medications on file as of 01/07/2021.   I spent 40 minutes providing this consultation; this includes time spent with patient/family, chart review and documentation. More than 50% of the time in this consultation was spent on counseling and coordinating communication  Thank you for the opportunity to participate in the care of Mr. Blakeney.  The palliative care team  will continue to follow. Please call our office at [phone] if we can be of additional assistance.   Note: Portions of this note were generated with Lobbyist. Dictation errors may occur despite best attempts at proofreading.  Teodoro Spray, NP

## 2021-01-09 ENCOUNTER — Encounter (HOSPITAL_COMMUNITY): Payer: Self-pay

## 2021-01-09 ENCOUNTER — Other Ambulatory Visit: Payer: Self-pay

## 2021-01-09 ENCOUNTER — Emergency Department (HOSPITAL_COMMUNITY)
Admission: EM | Admit: 2021-01-09 | Discharge: 2021-01-10 | Disposition: A | Discharge status: Home / Self Care | Payer: Medicare Other | Attending: Emergency Medicine | Admitting: Emergency Medicine

## 2021-01-09 DIAGNOSIS — R339 Retention of urine, unspecified: Secondary | ICD-10-CM | POA: Diagnosis not present

## 2021-01-09 DIAGNOSIS — R109 Unspecified abdominal pain: Secondary | ICD-10-CM | POA: Insufficient documentation

## 2021-01-09 DIAGNOSIS — D72829 Elevated white blood cell count, unspecified: Secondary | ICD-10-CM | POA: Insufficient documentation

## 2021-01-09 NOTE — ED Notes (Signed)
Bladder scan 434mL.

## 2021-01-09 NOTE — ED Triage Notes (Signed)
Patient BIB [facility] from [facility]. Facility was unable to tell when the patients catheter was last changed. They told the patient they were holding off on calling EMS because they were "busy." Per patient, the staff emptied 2 buckets of urine this morning. Patient having pain around catheter sight and suprapubic. Patient given tylenol for pain at facility before transport.

## 2021-01-09 NOTE — ED Provider Notes (Signed)
[facility] DEPT Provider Note   CSN: [id number] Arrival date & time: 01/09/21  2300     History  Chief Complaint  Patient presents with   Catheter Clogged    [name] [name] is a 61 y.o. male.  HPI  61 year old male with history of urinary retention presenting for evaluation of urinary retention.  Patient currently has an indwelling Foley catheter.  He states that since about 8 PM today he has had some suprapubic discomfort and thinks that his Foley is clogged as it is not draining properly.  He denies any nausea or vomiting.  He is not any fevers.  Symptoms constant in nature and worsened since onset.  Home Medications Prior to Admission medications   Medication Sig Start Date End Date Taking? Authorizing Provider  cephALEXin (KEFLEX) 500 MG capsule Take 1 capsule (500 mg total) by mouth 4 (four) times daily for 7 days. 01/10/21 01/17/21 Yes Donnie Panik S, PA-C  acetaminophen (TYLENOL) 325 MG tablet Take 2 tablets (650 mg total) by mouth every 6 (six) hours as needed for mild pain (or Fever >/= 101). 12/23/20   Sheikh, Georgina Quint Latif, DO  Amino Acids-Protein Hydrolys (FEEDING SUPPLEMENT, PRO-STAT SUGAR FREE 64,) LIQD Take 30 mLs by mouth 3 (three) times daily.    [provider]  apixaban (ELIQUIS) 2.5 MG TABS tablet Take 1 tablet (2.5 mg total) by mouth 2 (two) times daily. 12/23/20   Raiford Noble Latif, DO  bisacodyl (DULCOLAX) 10 MG suppository Place 10 mg rectally once as needed for moderate constipation.    [provider]  cyanocobalamin 100 MCG tablet Take 100 mcg by mouth daily.    [provider]  darunavir-cobicistat (PREZCOBIX) 800-150 MG tablet TAKE 1 TABLET BY MOUTH DAILY . SWALLOW WHOLE, DO NOT CRUSH, BREAK, OR CHEW TABLETS. TAKE WITH FOOD Patient taking differently: Take 1 tablet by mouth daily. 08/19/20   [name], MD  famotidine (PEPCID) 20 MG tablet Take 1 tablet (20 mg total) by mouth daily. 12/23/20    Raiford Noble Latif, DO  feeding supplement (ENSURE ENLIVE / ENSURE PLUS) LIQD Take 237 mLs by mouth daily. 12/23/20   Raiford Noble Latif, DO  loperamide (IMODIUM A-D) 2 MG tablet Take 2-4 mg by mouth as needed for diarrhea or loose stools (max dose of 68m/24 hours). 467mafter 1st loose stool and 66m79mfter each subsequent BM    [provider]  nutrition supplement, JUVEN, (JUVEN) PACK Take 1 packet by mouth 2 (two) times daily between meals. 12/23/20   SheRaiford Nobletif, DO  Nutritional Supplements (,FEEDING SUPPLEMENT, PROSOURCE PLUS) liquid Take 30 mLs by mouth 2 (two) times daily between meals. 12/23/20   Sheikh, Omair Latif, DO  ondansetron (ZOFRAN) 4 MG tablet Take 1 tablet (4 mg total) by mouth every 6 (six) hours as needed for nausea. 12/23/20   SheRaiford Nobletif, DO  oxycodone (OXY-IR) 5 MG capsule Take 1 capsule (5 mg total) by mouth See admin instructions. Give 5mg78m mouth daily for wound care. May also give 5mg 50mry 6 hours as needed for chronic pain 12/23/20   Sheikh, Omair Latif, DO  polycarbophil (FIBERCON) 625 MG tablet Take 625 mg by mouth daily.    [provider]  rilpivirine (EDURANT) 25 MG TABS tablet TAKE 1 TABLET (25 MG TOTAL) BY MOUTH DAILY WITH BREAKFAST Patient taking differently: Take 25 mg by mouth daily with breakfast. 08/19/20   HatchCampbell Riches simethicone (MYLICON) 80 MG chewable  tablet Chew 80 mg by mouth with breakfast, with lunch, and with evening meal.    [provider]  TIVICAY 50 MG tablet TAKE 1 TABLET (50 MG TOTAL) BY MOUTH DAILY Patient taking differently: Take 50 mg by mouth daily. 08/19/20   [name], MD  vitamin C (ASCORBIC ACID) 500 MG tablet Take 1,000 mg by mouth daily.    [provider]  Zinc Oxide (TRIPLE PASTE) 12.8 % ointment Apply topically 3 (three) times daily. 12/23/20   Raiford Noble Latif, DO  zinc sulfate 220 (50 Zn) MG capsule Take 1 capsule (220 mg total) by mouth daily. 12/24/20    Raiford Noble Latif, DO      Allergies    Collagen    Review of Systems   Review of Systems  Constitutional:  Negative for fever.  Respiratory:  Negative for shortness of breath.   Cardiovascular:  Negative for chest pain.  Gastrointestinal:  Positive for abdominal pain. Negative for diarrhea, nausea and vomiting.  Genitourinary:  Positive for difficulty urinating.  Musculoskeletal:  Negative for back pain.  Neurological:  Negative for headaches.   Physical Exam Updated Vital Signs BP 124/85    Pulse 77    Temp 98.3 F (36.8 C) (Oral)    Resp 14    Ht _0  (1.88 m)    Wt 58.1 kg    SpO2 98%    BMI 16.43 kg/m  Physical Exam Constitutional:      General: He is not in acute distress.    Appearance: He is well-developed.  Eyes:     Conjunctiva/sclera: Conjunctivae normal.  Cardiovascular:     Rate and Rhythm: Normal rate and regular rhythm.  Pulmonary:     Effort: Pulmonary effort is normal.     Breath sounds: Normal breath sounds.  Abdominal:     General: Bowel sounds are normal.     Palpations: Abdomen is soft.     Tenderness: There is abdominal tenderness (supapubic ttp).  Skin:    General: Skin is warm and dry.  Neurological:     Mental Status: He is alert and oriented to person, place, and time.    ED Results / Procedures / Treatments   Labs (all labs ordered are listed, but only abnormal results are displayed) Labs Reviewed  CBC WITH DIFFERENTIAL/PLATELET - Abnormal; Notable for the following components:      Result Value   WBC 11.7 (*)    RBC 2.56 (*)    Hemoglobin 8.4 (*)    HCT 27.3 (*)    MCV 106.6 (*)    RDW 18.9 (*)    nRBC 1.2 (*)    All other components within normal limits  BASIC METABOLIC PANEL - Abnormal; Notable for the following components:   Chloride 115 (*)    CO2 20 (*)    Glucose, Bld 104 (*)    BUN 61 (*)    Creatinine, Ser 1.64 (*)    GFR, Estimated 48 (*)    All other components within normal limits  URINALYSIS, ROUTINE W REFLEX  MICROSCOPIC - Abnormal; Notable for the following components:   Color, Urine AMBER (*)    APPearance TURBID (*)    Hgb urine dipstick MODERATE (*)    Protein, ur >=300 (*)    Leukocytes,Ua MODERATE (*)    RBC / HPF >50 (*)    WBC, UA >50 (*)    Bacteria, UA MANY (*)    All other components within normal limits  URINE CULTURE    EKG None  Radiology No results found.  Procedures Procedures    Medications Ordered in ED Medications - No data to display  ED Course/ Medical Decision Making/ A&P                           Medical Decision Making  61 y/o male presenting for urinary retention and suprapubic pain that started earlier today.  Foley was attempted to be flushed however nurse met resistance with this and was unable to irrigate.  Foley was switched out and patient now having normal urine output.  UA concerning for possible infection, will start on Keflex.  CBC reveals a mild leukocytosis, chronic anemia which appears stable, creatinine is mildly elevated probably from transient urinary retention however do not feel he requires admission.  Remainder of CMP is grossly unremarkable.  Patient discharged home to follow-up with urology.  Advised on plan for discharge and return precautions.  He voices understanding of primary care to return.  All questions answered.  Patient stable for discharge.   Final Clinical Impression(s) / ED Diagnoses Final diagnoses:  Urinary retention    Rx / DC Orders ED Discharge Orders          Ordered    cephALEXin (KEFLEX) 500 MG capsule  4 times daily        01/10/21 0124              Rodney Booze, PA-C 01/10/21 0124    Molpus, Jenny Reichmann, MD 01/10/21 [phone]

## 2021-01-10 DIAGNOSIS — R339 Retention of urine, unspecified: Secondary | ICD-10-CM | POA: Diagnosis not present

## 2021-01-10 LAB — CBC WITH DIFFERENTIAL/PLATELET
Abs Immature Granulocytes: 0.4 10*3/uL — ABNORMAL HIGH (ref 0.00–0.07)
Band Neutrophils: 6 %
Basophils Absolute: 0 10*3/uL (ref 0.0–0.1)
Basophils Relative: 0 %
Eosinophils Absolute: 0.2 10*3/uL (ref 0.0–0.5)
Eosinophils Relative: 2 %
HCT: 27.3 % — ABNORMAL LOW (ref 39.0–52.0)
Hemoglobin: 8.4 g/dL — ABNORMAL LOW (ref 13.0–17.0)
Lymphocytes Relative: 13 %
Lymphs Abs: 1.5 10*3/uL (ref 0.7–4.0)
MCH: 32.8 pg (ref 26.0–34.0)
MCHC: 30.8 g/dL (ref 30.0–36.0)
MCV: 106.6 fL — ABNORMAL HIGH (ref 80.0–100.0)
Monocytes Absolute: 0.7 10*3/uL (ref 0.1–1.0)
Monocytes Relative: 6 %
Myelocytes: 3 %
Neutro Abs: 8.9 10*3/uL — ABNORMAL HIGH (ref 1.7–7.7)
Neutrophils Relative %: 70 %
Platelets: 253 10*3/uL (ref 150–400)
RBC: 2.56 MIL/uL — ABNORMAL LOW (ref 4.22–5.81)
RDW: 18.9 % — ABNORMAL HIGH (ref 11.5–15.5)
WBC: 11.7 10*3/uL — ABNORMAL HIGH (ref 4.0–10.5)
nRBC: 1 /100 WBC — ABNORMAL HIGH
nRBC: 1.2 % — ABNORMAL HIGH (ref 0.0–0.2)

## 2021-01-10 LAB — URINALYSIS, ROUTINE W REFLEX MICROSCOPIC
Bilirubin Urine: NEGATIVE
Glucose, UA: NEGATIVE mg/dL
Ketones, ur: NEGATIVE mg/dL
Nitrite: NEGATIVE
Protein, ur: >=300 mg/dL — AB
RBC / HPF: >50 RBC/hpf — ABNORMAL HIGH (ref 0–5)
Specific Gravity, Urine: 1.013 (ref 1.005–1.030)
WBC, UA: >50 WBC/hpf — ABNORMAL HIGH (ref 0–5)
pH: 8 (ref 5.0–8.0)

## 2021-01-10 LAB — BASIC METABOLIC PANEL
Anion gap: 6 (ref 5–15)
BUN: 61 mg/dL — ABNORMAL HIGH (ref 6–20)
CO2: 20 mmol/L — ABNORMAL LOW (ref 22–32)
Calcium: 8.9 mg/dL (ref 8.9–10.3)
Chloride: 115 mmol/L — ABNORMAL HIGH (ref 98–111)
Creatinine, Ser: 1.64 mg/dL — ABNORMAL HIGH (ref 0.61–1.24)
GFR, Estimated: 48 mL/min — ABNORMAL LOW (ref >60)
Glucose, Bld: 104 mg/dL — ABNORMAL HIGH (ref 70–99)
Potassium: 4.2 mmol/L (ref 3.5–5.1)
Sodium: 141 mmol/L (ref 135–145)

## 2021-01-10 MED ORDER — CEPHALEXIN 500 MG PO CAPS
500.0000 mg | ORAL_CAPSULE | Freq: Once | ORAL | Status: AC
  Administered 2021-01-10: 500 mg via ORAL
  Filled 2021-01-10: qty 1

## 2021-01-10 MED ORDER — OXYCODONE-ACETAMINOPHEN 5-325 MG PO TABS
1.0000 | ORAL_TABLET | Freq: Once | ORAL | Status: AC
  Administered 2021-01-10: 1 via ORAL
  Filled 2021-01-10: qty 1

## 2021-01-10 MED ORDER — CEPHALEXIN 500 MG PO CAPS
500.0000 mg | ORAL_CAPSULE | Freq: Four times a day (QID) | ORAL | 0 refills | Status: AC
Start: 2021-01-10 — End: 2021-01-17

## 2021-01-10 NOTE — ED Notes (Signed)
[facility] called  

## 2021-01-10 NOTE — Discharge Instructions (Signed)
A culture was sent of your urine today to determine if there is any bacterial growth. If the results of the culture are positive and you require an antibiotic or a change of your prescribed antibiotic you will be contacted by the hospital. If the results are negative you will not be contacted.  Follow up with urology in 1 week  Please return to the emergency department for any new or worsening symptoms.

## 2021-01-11 LAB — URINE CULTURE

## 2021-01-20 ENCOUNTER — Ambulatory Visit: Payer: Medicare Other | Admitting: Infectious Diseases

## 2021-01-31 ENCOUNTER — Other Ambulatory Visit: Payer: Self-pay

## 2021-01-31 ENCOUNTER — Encounter (HOSPITAL_COMMUNITY): Payer: Self-pay

## 2021-01-31 ENCOUNTER — Emergency Department (HOSPITAL_COMMUNITY)
Admission: EM | Admit: 2021-01-31 | Discharge: 2021-01-31 | Disposition: A | Discharge status: Skilled Nursing Facility | Payer: Medicare Other | Attending: Emergency Medicine | Admitting: Emergency Medicine

## 2021-01-31 DIAGNOSIS — R1030 Lower abdominal pain, unspecified: Secondary | ICD-10-CM | POA: Diagnosis present

## 2021-01-31 DIAGNOSIS — Z21 Asymptomatic human immunodeficiency virus [HIV] infection status: Secondary | ICD-10-CM | POA: Diagnosis not present

## 2021-01-31 DIAGNOSIS — X58XXXA Exposure to other specified factors, initial encounter: Secondary | ICD-10-CM | POA: Insufficient documentation

## 2021-01-31 DIAGNOSIS — T83098A Other mechanical complication of other indwelling urethral catheter, initial encounter: Secondary | ICD-10-CM | POA: Insufficient documentation

## 2021-01-31 DIAGNOSIS — R339 Retention of urine, unspecified: Secondary | ICD-10-CM

## 2021-01-31 DIAGNOSIS — T839XXA Unspecified complication of genitourinary prosthetic device, implant and graft, initial encounter: Secondary | ICD-10-CM

## 2021-01-31 DIAGNOSIS — Z7901 Long term (current) use of anticoagulants: Secondary | ICD-10-CM | POA: Insufficient documentation

## 2021-01-31 DIAGNOSIS — N390 Urinary tract infection, site not specified: Secondary | ICD-10-CM | POA: Diagnosis not present

## 2021-01-31 DIAGNOSIS — B20 Human immunodeficiency virus [HIV] disease: Secondary | ICD-10-CM

## 2021-01-31 LAB — CBC WITH DIFFERENTIAL/PLATELET
Abs Immature Granulocytes: 0.21 10*3/uL — ABNORMAL HIGH (ref 0.00–0.07)
Basophils Absolute: 0.1 10*3/uL (ref 0.0–0.1)
Basophils Relative: 1 %
Eosinophils Absolute: 0.3 10*3/uL (ref 0.0–0.5)
Eosinophils Relative: 2 %
HCT: 31.1 % — ABNORMAL LOW (ref 39.0–52.0)
Hemoglobin: 9.4 g/dL — ABNORMAL LOW (ref 13.0–17.0)
Immature Granulocytes: 2 %
Lymphocytes Relative: 10 %
Lymphs Abs: 1.2 10*3/uL (ref 0.7–4.0)
MCH: 31.5 pg (ref 26.0–34.0)
MCHC: 30.2 g/dL (ref 30.0–36.0)
MCV: 104.4 fL — ABNORMAL HIGH (ref 80.0–100.0)
Monocytes Absolute: 0.8 10*3/uL (ref 0.1–1.0)
Monocytes Relative: 7 %
Neutro Abs: 9.6 10*3/uL — ABNORMAL HIGH (ref 1.7–7.7)
Neutrophils Relative %: 78 %
Platelets: 271 10*3/uL (ref 150–400)
RBC: 2.98 MIL/uL — ABNORMAL LOW (ref 4.22–5.81)
RDW: 17.4 % — ABNORMAL HIGH (ref 11.5–15.5)
WBC: 12.2 10*3/uL — ABNORMAL HIGH (ref 4.0–10.5)
nRBC: 0.2 % (ref 0.0–0.2)

## 2021-01-31 LAB — BASIC METABOLIC PANEL
Anion gap: 9 (ref 5–15)
BUN: 36 mg/dL — ABNORMAL HIGH (ref 6–20)
CO2: 18 mmol/L — ABNORMAL LOW (ref 22–32)
Calcium: 9.4 mg/dL (ref 8.9–10.3)
Chloride: 111 mmol/L (ref 98–111)
Creatinine, Ser: 1.47 mg/dL — ABNORMAL HIGH (ref 0.61–1.24)
GFR, Estimated: 55 mL/min — ABNORMAL LOW (ref >60)
Glucose, Bld: 102 mg/dL — ABNORMAL HIGH (ref 70–99)
Potassium: 4.1 mmol/L (ref 3.5–5.1)
Sodium: 138 mmol/L (ref 135–145)

## 2021-01-31 LAB — URINALYSIS, ROUTINE W REFLEX MICROSCOPIC
Bilirubin Urine: NEGATIVE
Glucose, UA: NEGATIVE mg/dL
Hgb urine dipstick: NEGATIVE
Ketones, ur: NEGATIVE mg/dL
Nitrite: NEGATIVE
Protein, ur: 100 mg/dL — AB
Specific Gravity, Urine: 1.011 (ref 1.005–1.030)
WBC, UA: >50 WBC/hpf — ABNORMAL HIGH (ref 0–5)
pH: 9 — ABNORMAL HIGH (ref 5.0–8.0)

## 2021-01-31 NOTE — ED Notes (Signed)
Attempt to call report to facility with no answer

## 2021-01-31 NOTE — ED Notes (Signed)
Second attempt to call report to [facility]- no answer [phone]

## 2021-01-31 NOTE — ED Provider Notes (Signed)
[facility] DEPT Provider Note   CSN: [id number] Arrival date & time: 01/31/21  1908     History  Chief Complaint  Patient presents with   Urinary Retention    [name] [name] is a 61 y.o. male.  He has a history of HIV and functional quadriplegia.  He is currently at rehab and has had a catheter in for over a month.  He is complaining of no urine output since this morning and severe lower abdominal pain.  He has had the catheter clogged before and is needed to be changed.  He denies any fevers chills chest pain shortness of breath cough nausea or vomiting.  He is nonambulatory.  The history is provided by the patient.  Abdominal Pain Pain location:  Suprapubic Pain quality: aching   Pain radiates to:  Does not radiate Pain severity:  Severe Onset quality:  Gradual Duration:  1 day Timing:  Constant Progression:  Worsening Chronicity:  Recurrent Context: not trauma   Relieved by:  Nothing Worsened by:  Nothing Ineffective treatments:  None tried Associated symptoms: no chest pain, no cough, no dysuria, no fever, no nausea and no vomiting   Risk factors: recent hospitalization       Home Medications Prior to Admission medications   Medication Sig Start Date End Date Taking? Authorizing Provider  acetaminophen (TYLENOL) 325 MG tablet Take 2 tablets (650 mg total) by mouth every 6 (six) hours as needed for mild pain (or Fever >/= 101). 12/23/20   Sheikh, Georgina Quint Latif, DO  Amino Acids-Protein Hydrolys (FEEDING SUPPLEMENT, PRO-STAT SUGAR FREE 64,) LIQD Take 30 mLs by mouth 3 (three) times daily.    [provider]  apixaban (ELIQUIS) 2.5 MG TABS tablet Take 1 tablet (2.5 mg total) by mouth 2 (two) times daily. 12/23/20   Raiford Noble Latif, DO  bisacodyl (DULCOLAX) 10 MG suppository Place 10 mg rectally once as needed for moderate constipation.    [provider]  cyanocobalamin 100 MCG tablet Take 100 mcg by mouth daily.     [provider]  darunavir-cobicistat (PREZCOBIX) 800-150 MG tablet TAKE 1 TABLET BY MOUTH DAILY . SWALLOW WHOLE, DO NOT CRUSH, BREAK, OR CHEW TABLETS. TAKE WITH FOOD Patient taking differently: Take 1 tablet by mouth daily. 08/19/20   [name], MD  famotidine (PEPCID) 20 MG tablet Take 1 tablet (20 mg total) by mouth daily. 12/23/20   Raiford Noble Latif, DO  feeding supplement (ENSURE ENLIVE / ENSURE PLUS) LIQD Take 237 mLs by mouth daily. 12/23/20   Raiford Noble Latif, DO  loperamide (IMODIUM A-D) 2 MG tablet Take 2-4 mg by mouth as needed for diarrhea or loose stools (max dose of 16mg /24 hours). 4mg  after 1st loose stool and 2mg  after each subsequent BM    [provider]  nutrition supplement, JUVEN, (JUVEN) PACK Take 1 packet by mouth 2 (two) times daily between meals. 12/23/20   Raiford Noble Latif, DO  Nutritional Supplements (,FEEDING SUPPLEMENT, PROSOURCE PLUS) liquid Take 30 mLs by mouth 2 (two) times daily between meals. 12/23/20   Sheikh, Omair Latif, DO  ondansetron (ZOFRAN) 4 MG tablet Take 1 tablet (4 mg total) by mouth every 6 (six) hours as needed for nausea. 12/23/20   Raiford Noble Latif, DO  oxycodone (OXY-IR) 5 MG capsule Take 1 capsule (5 mg total) by mouth See admin instructions. Give 5mg  by mouth daily for wound care. May also give 5mg  every 6 hours as needed for chronic pain 12/23/20  Sheikh, Omair Latif, DO  polycarbophil (FIBERCON) 625 MG tablet Take 625 mg by mouth daily.    [provider]  rilpivirine (EDURANT) 25 MG TABS tablet TAKE 1 TABLET (25 MG TOTAL) BY MOUTH DAILY WITH BREAKFAST Patient taking differently: Take 25 mg by mouth daily with breakfast. 08/19/20   [name], MD  simethicone (MYLICON) 80 MG chewable tablet Chew 80 mg by mouth with breakfast, with lunch, and with evening meal.    [provider]  TIVICAY 50 MG tablet TAKE 1 TABLET (50 MG TOTAL) BY MOUTH DAILY Patient taking differently: Take 50 mg by  mouth daily. 08/19/20   [name], MD  vitamin C (ASCORBIC ACID) 500 MG tablet Take 1,000 mg by mouth daily.    [provider]  Zinc Oxide (TRIPLE PASTE) 12.8 % ointment Apply topically 3 (three) times daily. 12/23/20   Raiford Noble Latif, DO  zinc sulfate 220 (50 Zn) MG capsule Take 1 capsule (220 mg total) by mouth daily. 12/24/20   Raiford Noble Latif, DO      Allergies    Collagen    Review of Systems   Review of Systems  Constitutional:  Negative for fever.  Respiratory:  Negative for cough.   Cardiovascular:  Negative for chest pain.  Gastrointestinal:  Positive for abdominal pain. Negative for nausea and vomiting.  Genitourinary:  Positive for decreased urine volume and difficulty urinating. Negative for dysuria.   Physical Exam Updated Vital Signs BP (!) 167/105 (BP Location: Right Arm)    Pulse 95    Temp 98.5 F (36.9 C) (Oral)    Resp (!) 22    Ht 6\' 2"  (1.88 m)    Wt 59 kg    SpO2 100%    BMI 16.69 kg/m  Physical Exam Vitals and nursing note reviewed.  Constitutional:      General: He is not in acute distress.    Appearance: Normal appearance. He is well-developed.  HENT:     Head: Normocephalic and atraumatic.  Eyes:     Conjunctiva/sclera: Conjunctivae normal.  Cardiovascular:     Rate and Rhythm: Normal rate and regular rhythm.     Heart sounds: No murmur heard. Pulmonary:     Effort: Pulmonary effort is normal. No respiratory distress.     Breath sounds: Normal breath sounds.  Abdominal:     Palpations: Abdomen is soft.     Tenderness: There is abdominal tenderness (Suprapubic). There is no guarding or rebound.  Musculoskeletal:        General: No swelling.     Cervical back: Neck supple.  Skin:    General: Skin is warm and dry.     Capillary Refill: Capillary refill takes less than 2 seconds.  Neurological:     Mental Status: He is alert. Mental status is at baseline.    ED Results / Procedures / Treatments   Labs (all labs  ordered are listed, but only abnormal results are displayed) Labs Reviewed  BASIC METABOLIC PANEL - Abnormal; Notable for the following components:      Result Value   CO2 18 (*)    Glucose, Bld 102 (*)    BUN 36 (*)    Creatinine, Ser 1.47 (*)    GFR, Estimated 55 (*)    All other components within normal limits  CBC WITH DIFFERENTIAL/PLATELET - Abnormal; Notable for the following components:   WBC 12.2 (*)    RBC 2.98 (*)    Hemoglobin 9.4 (*)  HCT 31.1 (*)    MCV 104.4 (*)    RDW 17.4 (*)    Neutro Abs 9.6 (*)    Abs Immature Granulocytes 0.21 (*)    All other components within normal limits  URINALYSIS, ROUTINE W REFLEX MICROSCOPIC - Abnormal; Notable for the following components:   APPearance TURBID (*)    pH 9.0 (*)    Protein, ur 100 (*)    Leukocytes,Ua LARGE (*)    WBC, UA >50 (*)    Bacteria, UA FEW (*)    All other components within normal limits  URINE CULTURE    EKG None  Radiology No results found.  Procedures Procedures    Medications Ordered in ED Medications - No data to display  ED Course/ Medical Decision Making/ A&P Clinical Course as of 02/01/21 9924  Mon Jan 31, 2021  2102 Patient feels better after replacement of catheter.  There is some very turbid urine.  He is already on antibiotics at the facility so sent urine culture and will let them make any medication adjustments. [MB]    Clinical Course User Index [MB] Hayden Rasmussen, MD                           Medical Decision Making Amount and/or Complexity of Data Reviewed Labs: ordered.  This patient complains of suprapubic pain and no urine output; this involves an extensive number of treatment Options and is a complaint that carries with it a high risk of complications and Morbidity. The differential includes urinary retention, Foley obstruction, UTI, metabolic derangement  I ordered, reviewed and interpreted labs, which included CBC with elevated white count, hemoglobin low  but stable from priors, chemistries with low bicarb elevated BUN and creatinine reflecting some dehydration, similar to priors, urinalysis with greater than 50 whites few bacteria, urine sent for culture  Additional history obtained from EMS Previous records obtained and reviewed in epic, patient has been seen before and had Foley catheter replaced.  After the interventions stated above, I reevaluated the patient and found patient to be symptomatically improved.  No indications for admission at this time.  He is comfortable with plan to return back to his facility for continued IV antibiotics.  Return instructions discussed          Final Clinical Impression(s) / ED Diagnoses Final diagnoses:  Urinary retention  Problem with Foley catheter, initial encounter ([place])  HIV infection, unspecified symptom status ([place])    Rx / DC Orders ED Discharge Orders     None         Hayden Rasmussen, MD 02/01/21 [phone]

## 2021-01-31 NOTE — ED Triage Notes (Signed)
Pt biba from [facility]. Had some serious abd pain starting this AM that radiated down to his penis. Has a chronic foley that was replaced a few weeks ago

## 2021-01-31 NOTE — Discharge Instructions (Addendum)
You were seen in the emergency department for evaluation of lower abdominal pain and no urine output.  Your Foley catheter was exchanged with improvement in your symptoms.  A urine culture was sent.  Please continue your regular medications and follow-up with your treating providers.  Return to the emergency department if any worsening or concerning symptoms

## 2021-01-31 NOTE — ED Notes (Signed)
[facility] called  

## 2021-02-03 LAB — URINE CULTURE: Culture: >=100000 — AB

## 2021-02-04 ENCOUNTER — Telehealth: Payer: Self-pay | Admitting: *Deleted

## 2021-02-04 NOTE — Telephone Encounter (Signed)
Post ED Visit - Positive Culture Follow-up  Culture report reviewed by antimicrobial stewardship pharmacist: [facility] Team []  Elenor Quinones, Pharm.D. []  Heide Guile, Pharm.D., BCPS AQ-ID []  Parks Neptune, Pharm.D., BCPS []  Alycia Rossetti, Pharm.D., BCPS []  [place], Pharm.D., BCPS, AAHIVP []  Legrand Como, Pharm.D., BCPS, AAHIVP []  Salome Arnt, PharmD, BCPS []  Johnnette Gourd, PharmD, BCPS []  Hughes Better, PharmD, BCPS []  Leeroy Cha, PharmD []  Laqueta Linden, PharmD, BCPS []  Albertina Parr, PharmD  [place] Team []  Leodis Sias, PharmD []  Lindell Spar, PharmD []  Royetta Asal, PharmD []  Graylin Shiver, Rph []  Rema Fendt) Glennon Mac, PharmD []  Arlyn Dunning, PharmD []  Netta Cedars, PharmD []  Dia Sitter, PharmD []  Leone Haven, PharmD [x]  Gretta Arab, PharmD []  Theodis Shove, PharmD []  Peggyann Juba, PharmD []  Reuel Boom, PharmD   Positive urine culture Asymptomatic and no further patient follow-up is required at this time., Dr.  Suzzette Righter, Danny Lawless 02/04/2021, 11:02 AM

## 2021-02-16 ENCOUNTER — Other Ambulatory Visit: Payer: Self-pay

## 2021-02-16 ENCOUNTER — Non-Acute Institutional Stay: Payer: Medicare Other | Admitting: Hospice

## 2021-02-16 DIAGNOSIS — Z515 Encounter for palliative care: Secondary | ICD-10-CM

## 2021-02-16 DIAGNOSIS — L89154 Pressure ulcer of sacral region, stage 4: Secondary | ICD-10-CM

## 2021-02-16 DIAGNOSIS — R531 Weakness: Secondary | ICD-10-CM

## 2021-02-16 DIAGNOSIS — R532 Functional quadriplegia: Secondary | ICD-10-CM

## 2021-02-16 NOTE — Progress Notes (Signed)
[facility] Consult Note Telephone: [phone]  Fax: [phone]  PATIENT NAME: [name] [name] [address] [place] [address] [phone] (home)  DOB: 08-12-1960 MRN: [id number]  PRIMARY CARE PROVIDER:    [name], MD,  [facility] [place] [address] [phone]  REFERRING PROVIDER:   Martinique Miller NP  RESPONSIBLE PARTY:   [place]     Name Relation Home Work Mobile   Mildred Sister [phone]     Fonnie Jarvis [phone]         I met face to face with patient  and his sisters at facility. Palliative Care was asked to follow this patient by consultation request of Martinique Miller NP to address advance care planning, complex medical decision making and goals of care clarification. Meeting with patient, [name] and [name] and later with the Occupational therapy technician. This is a follow up visit.    ASSESSMENT AND / RECOMMENDATIONS:   CODE STATUS: Patient is a Do Not Resuscitate.   Goals of Care: Goals include to maximize quality of life and symptom management. [name] said family is interested in hospice service in the future but not quite there at this time.   Symptom Management/Plan: Stage 4 Sacral decubitus ulcer: Plan is in process for a general surgery consult for possible skin flap.. Continue [place] honey sheets and dressing changes as ordered. Air mattress is in place. Encouraged repositioning to alleviate pressure.  Hospital report indicates GI recommended consideration of hospice. Protein Caloric Malnutrition: Current weight is 131.5 g.  Continue Protostat and Ensure for protein augmentation. Provide assistance during meals to ensure adequate oral intake/hydration. Nutritionist consult as needed.  Weakness/Functional Quadriplegia: PT affirmed is cooperative with plan of care/exercises to promote autonomy and environmental adaptation, promote  motor skills , preserve mobility.  After discussion with the PT, status is extended for another 2 weeks. HIV/AIDS: Managed with [facility] and [facility],  Follow up: Palliative care will continue to follow for complex medical decision making, advance care planning, and clarification of goals. Return 6 weeks or prn.Encouraged to call provider sooner with any concerns.   Family /Caregiver/Community Supports: Patient in SNF for ongoing care.  HOSPICE ELIGIBILITY/DIAGNOSIS: TBD  Chief Complaint: follow-up visit visit  HISTORY OF PRESENT ILLNESS:  [name] [name] is a 61 y.o. year old male  with multiple medical conditions including Protein caloric malnutrition, chronic skin breakdown-sacral decubitus ulcer, with occasional bleeding in the past.  Plan is in process for general surgery consult for possible skin flap.  Patient is a total care, poor historian due to cognitive deficits.  He currently has an air mattress. History of functional quadriplegia, weakness HIV/AIDS, stage IV sacral decubitus ulcer, metabolic encephalopathy.  He denies pain/discomfort.  See status expressed concern that PT should continue and that was discussed with PT staff.  No other concerns at this time History obtained from review of EMR, discussion with primary team, caregiver, family and/or [name].  Review and summarization of Epic records shows history from other than patient. Rest of 10 point ROS asked and negative.  I reviewed as needed, available labs, patient records, imaging, studies and related documents from the EMR.  Physical Exam: Height/Weight: 6 feet 2 inches/135.5 pounds Constitutional: NAD General: Well groomed, cooperative EYES: anicteric sclera, lids intact, no discharge  ENMT: Moist mucous membrane CV: S1 S2, RRR, no LE edema Pulmonary: LCTA, no increased work of breathing, no cough, Abdomen: active BS +  4 quadrants, soft and non tender GU: no suprapubic tenderness; foley cath in place, clear  light yellow urine in bag MSK: weakness, sarcopenia, limited ROM, prevalon booth to bil feet Skin: warm and dry, dressing to sacrum clean dry and intact Neuro:  weakness, otherwise non focal Psych: non-anxious affect Hem/lymph/immuno: no widespread bruising   PAST MEDICAL HISTORY:  Active Ambulatory Problems    Diagnosis Date Noted   Human immunodeficiency virus (HIV) disease ([place]) 10/19/2005   MOLLUSCUM CONTAGIOSUM 10/19/2005   Thrush of mouth and esophagus ([place]) 02/16/2006   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   GOUT 10/19/2005   ANEMIA, CHRONIC 03/20/2007   Acute thromboembolism of deep veins of lower extremity ([place]) 08/22/2006   ABSCESS, RECTUM 04/11/2006   Urethral stricture 09/05/2007   DIARRHEA 02/16/2006   HEPATITIS B, HX OF 10/19/2005   HERPES ZOSTER, HX OF 10/19/2005   Unspecified severe protein-calorie malnutrition ([place]) 02/14/2007   Enlargement of lymph nodes 01/13/2013   Inguinal adenopathy 02/18/2013   Pain in limb 07/23/2013   Septic arthritis of hip ([place]) 07/31/2013   Sacral decubitus ulcer, stage IV ([place]) 07/31/2013   Protein-calorie malnutrition, severe ([place]) 07/31/2013   Renal failure, chronic 06/10/2014   Poor dentition 01/16/2017   Rectal bleeding 02/20/2020   Abnormal digital rectal exam 02/20/2020   History of anal cancer 02/20/2020   AKI (acute kidney injury) ([place]) 05/13/2020   Lactic acidosis 05/13/2020   SDH (subdural hematoma) 05/13/2020   Hypothermia due to exposure 05/13/2020   Septic shock ([place]) 05/13/2020   Cellulitis 05/13/2020   Elevated brain natriuretic peptide (BNP) level 05/13/2020   Volume overload 05/13/2020   Dehydration 05/13/2020   Decubitus ulcer 05/13/2020   Subdural empyema    Streptococcal bacteremia    Malnutrition of moderate degree 05/14/2020   Onychomycosis 08/19/2020   Penile ulcer 36/62/9476   Acute metabolic encephalopathy 54/65/0354   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   Chronic diastolic CHF  (congestive heart failure) ([facility]) 10/09/2020   Anemia 10/09/2020   Rigidity 10/09/2020   Hypernatremia 10/09/2020   Functional quadriplegia ([place]) 10/09/2020   Pressure injury of skin 10/10/2020   Acute encephalopathy 10/10/2020   Encephalopathy    Palliative care encounter 12/09/2020   Ileus ([place]) 12/09/2020   GIB (gastrointestinal bleeding) 12/16/2020   Bleeding from wound 12/19/2020   ABLA (acute blood loss anemia) 12/19/2020   Acute urinary retention 12/19/2020   FTT (failure to thrive) in adult 12/19/2020   Thrombocytopenia ([place]) 12/21/2020   Resolved Ambulatory Problems    Diagnosis Date Noted   TOBACCO USER 02/16/2006   Dental caries 03/20/2007   Septic shock(785.52) 08/03/2013   S/P right TH revision 06/15/2014   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   Past Medical History:  Diagnosis Date   Arthritis    Decubitus ulcer of sacral area    DVT (deep venous thrombosis) ([place]) 2008   Exposure to hepatitis B    H/O hypotension    History of anemia    History of gout    History of transfusion    History of urinary retention    HIV (human immunodeficiency virus infection) ([place])    Hx of sepsis 2015   PONV (postoperative nausea and vomiting)    Radiation    Rectal cancer ([place])    Shingles     SOCIAL HX:  Social History   Tobacco Use   Smoking status: Former    Packs/day: 0.30    Types: Cigarettes   Smokeless tobacco: Never  Substance Use  Topics   Alcohol use: Not Currently    Alcohol/week: 3.0 standard drinks    Types: 3 Standard drinks or equivalent per week     FAMILY HX:  Family History  Problem Relation Age of Onset   Diabetes Mother    Dementia Mother    Atrial fibrillation Mother    Hypertension Mother    Cancer Father    Emphysema Father       ALLERGIES:  Allergies  Allergen Reactions   Collagen Rash    redness      PERTINENT MEDICATIONS:  Outpatient Encounter Medications as of 02/16/2021  Medication Sig   acetaminophen (TYLENOL)  325 MG tablet Take 2 tablets (650 mg total) by mouth every 6 (six) hours as needed for mild pain (or Fever >/= 101).   Amino Acids-Protein Hydrolys (FEEDING SUPPLEMENT, PRO-STAT SUGAR FREE 64,) LIQD Take 30 mLs by mouth 3 (three) times daily.   apixaban (ELIQUIS) 2.5 MG TABS tablet Take 1 tablet (2.5 mg total) by mouth 2 (two) times daily.   bisacodyl (DULCOLAX) 10 MG suppository Place 10 mg rectally once as needed for moderate constipation.   cyanocobalamin 100 MCG tablet Take 100 mcg by mouth daily.   darunavir-cobicistat (PREZCOBIX) 800-150 MG tablet TAKE 1 TABLET BY MOUTH DAILY . SWALLOW WHOLE, DO NOT CRUSH, BREAK, OR CHEW TABLETS. TAKE WITH FOOD (Patient taking differently: Take 1 tablet by mouth daily.)   famotidine (PEPCID) 20 MG tablet Take 1 tablet (20 mg total) by mouth daily.   feeding supplement (ENSURE ENLIVE / ENSURE PLUS) LIQD Take 237 mLs by mouth daily.   loperamide (IMODIUM A-D) 2 MG tablet Take 2-4 mg by mouth as needed for diarrhea or loose stools (max dose of 88m/24 hours). 496mafter 1st loose stool and 58m758mfter each subsequent BM   nutrition supplement, JUVEN, (JUVEN) PACK Take 1 packet by mouth 2 (two) times daily between meals.   Nutritional Supplements (,FEEDING SUPPLEMENT, PROSOURCE PLUS) liquid Take 30 mLs by mouth 2 (two) times daily between meals.   ondansetron (ZOFRAN) 4 MG tablet Take 1 tablet (4 mg total) by mouth every 6 (six) hours as needed for nausea.   oxycodone (OXY-IR) 5 MG capsule Take 1 capsule (5 mg total) by mouth See admin instructions. Give 5mg658m mouth daily for wound care. May also give 5mg 10mry 6 hours as needed for chronic pain   polycarbophil (FIBERCON) 625 MG tablet Take 625 mg by mouth daily.   rilpivirine (EDURANT) 25 MG TABS tablet TAKE 1 TABLET (25 MG TOTAL) BY MOUTH DAILY WITH BREAKFAST (Patient taking differently: Take 25 mg by mouth daily with breakfast.)   simethicone (MYLICON) 80 MG chewable tablet Chew 80 mg by mouth with breakfast, with  lunch, and with evening meal.   TIVICAY 50 MG tablet TAKE 1 TABLET (50 MG TOTAL) BY MOUTH DAILY (Patient taking differently: Take 50 mg by mouth daily.)   vitamin C (ASCORBIC ACID) 500 MG tablet Take 1,000 mg by mouth daily.   Zinc Oxide (TRIPLE PASTE) 12.8 % ointment Apply topically 3 (three) times daily.   zinc sulfate 220 (50 Zn) MG capsule Take 1 capsule (220 mg total) by mouth daily.   No facility-administered encounter medications on file as of 02/16/2021.   I spent 45 minutes providing this consultation; this includes time spent with patient/family, chart review and documentation. More than 50% of the time in this consultation was spent on counseling and coordinating communication  Thank you for the opportunity to participate in  the care of Mr. Carbine.  The palliative care team will continue to follow. Please call our office at [phone] if we can be of additional assistance.   Note: Portions of this note were generated with L[employer]ist. Dictation errors may occur despite best attempts at proofreading.  Teodoro Spray, NP

## 2021-02-17 ENCOUNTER — Other Ambulatory Visit: Payer: Self-pay

## 2021-02-17 ENCOUNTER — Ambulatory Visit (INDEPENDENT_AMBULATORY_CARE_PROVIDER_SITE_OTHER): Payer: Medicare Other | Admitting: Infectious Diseases

## 2021-02-17 ENCOUNTER — Encounter: Payer: Self-pay | Admitting: Infectious Diseases

## 2021-02-17 DIAGNOSIS — B370 Candidal stomatitis: Secondary | ICD-10-CM

## 2021-02-17 DIAGNOSIS — B20 Human immunodeficiency virus [HIV] disease: Secondary | ICD-10-CM

## 2021-02-17 DIAGNOSIS — M4628 Osteomyelitis of vertebra, sacral and sacrococcygeal region: Secondary | ICD-10-CM

## 2021-02-17 DIAGNOSIS — B3781 Candidal esophagitis: Secondary | ICD-10-CM

## 2021-02-17 NOTE — Assessment & Plan Note (Signed)
Appreciate his SNF care as well as palliative care Will defer on checking his HIV labs at this point.

## 2021-02-17 NOTE — Assessment & Plan Note (Signed)
Await repeat eval by general surgeon.  Pain mgmt per his SNF provider.  My great appreciation to them Off loading Nutrition. Getting supplements.

## 2021-02-17 NOTE — Progress Notes (Signed)
Subjective:    Patient ID: [name] [name], male  DOB: July 20, 1960, 61 y.o.        MRN: [id number]   HPI 61 yo M with hx of HIV/AIDS, rectal CA, adm to [facility] on 5-4 after being found down. He was found to have group B streptococcal bacteremia. 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 He initially required pressors. He had TEE (-). 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 He had aspiration of his hip while in hospital which did not grow bacteria but did show 31,570 WBC. He was treated with Ceftriaxone IVPB to end on 06-28-20. His ART was restarted in hospital (Prezcobix/edurant/tivicay qday). He was also restarted on bactrim.   SNF is concerned that area around sacral decub is red and irritated at his prev episode. 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 No drainable soft tissue abscess at this time.  -Feathery intramuscular edema within the piriformis muscles and hip adductors bilaterally, likely reactive. He was also found to have wound on his L foot- plain films- no osteo.  He had PIC 9-3 at SNF and anbx started (vanco/zosyn) on 09-12-20.    He has a wound vac on his L foot and a dressing on his sacral wound.  Since starting on anbx he has had mild nausea and "gas". Has also had loose BM (watery). Has been eating well, gained was 131.7 2 days ago. (Up 2.5 #).  (08-14-20) CRP 20.3   He was then adm to [facility] on 10-1 with ARF (felt to be due to vanco) as well as encephalopathy (due to baclofen and metoclopramide). His baclofen dose was reduced, his metaclopramide was stopped, his anbx were changed to augmentin and doxy, end date 10-25-20.    At visit 10-24-20: He c/o sore throat. And was given rx for diflucan.   Wound care is being done at SNF, he is not able to go to [facility].  He has air mattress.  He had labs which showed Cr 2.48  CO2 15.  His repeat yesterday  was 1.80. CO2 17. H/H 8.8/26.4. C diff (-).    Flu vax 10-14-20 at SNF.    11-04-20: "I feel ok". "It's the same as last time I was here". He was started on diflucan for thrush.   12-09-20: "I'm getting sick a lot" emesis. Family notes he has burping (? Reflux") a lot. He had an x-ray of his stomach. Was found to have "gas and stool". (Plain film- may represent ileus).  He had arterial studies in his LE and was found to have severe PVD R, mild PVD L, bilateral CFV DVT. He is on eliquis.  Cr 1.7 (11-24-20)  02-17-21 Today is here with his sister.  He has had visits with palliative care- DNR but o/w continue current care.  He has been seen in ED as well for urinary retention of his catheter. This was replaced. Has been seen by urology as well. He didn't want suprapubic catheter.  The catheter was left in place due to concerns about his having urinary incont and this getting into his wound/decubitus.   He is awaiting a new surgeon to eval his wound. No change in his treatment rx, or his wound (per his sister).  He is doing well at [facility] having wound pain with prolonged sitting. He has gotten lidocaine patches  and other rx (which has not processed yet).  Wt 131.5 last at [facility].    HIV 1 RNA Quant  Date Value  10/10/2020 120 copies/mL  07/20/2020 73 Copies/mL (H)  06/17/2020 152 copies/mL (H)   CD4 T Cell Abs (/uL)  Date Value  10/10/2020 167 (L)  07/20/2020 334 (L)  05/13/2020 <35 (L)     Health Maintenance  Topic Date Due   TETANUS/TDAP  Never done   Zoster Vaccines- Shingrix (1 of 2) Never done   COLONOSCOPY (Pts 45-35yrs Insurance coverage will need to be confirmed)  Never done   INFLUENZA VACCINE  08/09/2020   COVID-19 Vaccine  Completed   Hepatitis C Screening  Completed   HIV Screening  Completed   HPV VACCINES  Aged Out      Review of Systems  Constitutional:  Negative for weight loss.  HENT:  Negative for sore throat.    Respiratory:  Negative for cough and shortness of breath.   Gastrointestinal:  Negative for constipation and diarrhea.  Skin:        Decubitus wound   Please see HPI. All other systems reviewed and negative.     Objective:  Physical Exam Vitals reviewed.  Constitutional:      Appearance: Normal appearance.  HENT:     Mouth/Throat:     Mouth: Mucous membranes are moist.     Pharynx: Oropharyngeal exudate present.  Eyes:     Extraocular Movements: Extraocular movements intact.     Pupils: Pupils are equal, round, and reactive to light.  Cardiovascular:     Rate and Rhythm: Tachycardia present.  Pulmonary:     Effort: Pulmonary effort is normal.     Breath sounds: Normal breath sounds.  Abdominal:     General: Bowel sounds are normal. There is no distension.     Palpations: Abdomen is soft.     Tenderness: There is no abdominal tenderness.  Musculoskeletal:     Cervical back: Normal range of motion and neck supple.     Right lower leg: No edema.     Left lower leg: No edema.  Neurological:     Mental Status: He is alert.          Assessment & Plan:

## 2021-02-17 NOTE — Assessment & Plan Note (Signed)
Not clear if this has returned He feels his current oral concerns are due to his medicines, not thrush.  Will hold on repeat dose of diflucan.

## 2021-03-17 ENCOUNTER — Emergency Department (HOSPITAL_COMMUNITY): Payer: Medicare Other

## 2021-03-17 ENCOUNTER — Inpatient Hospital Stay (HOSPITAL_COMMUNITY)
Admission: EM | Admit: 2021-03-17 | Discharge: 2021-03-23 | DRG: 698 | Disposition: A | Discharge status: Skilled Nursing Facility | Payer: Medicare Other | Attending: Internal Medicine | Admitting: Internal Medicine

## 2021-03-17 ENCOUNTER — Other Ambulatory Visit: Payer: Self-pay

## 2021-03-17 ENCOUNTER — Ambulatory Visit (INDEPENDENT_AMBULATORY_CARE_PROVIDER_SITE_OTHER): Payer: Medicare Other | Admitting: Infectious Diseases

## 2021-03-17 ENCOUNTER — Encounter (HOSPITAL_COMMUNITY): Payer: Self-pay | Admitting: Emergency Medicine

## 2021-03-17 DIAGNOSIS — L89612 Pressure ulcer of right heel, stage 2: Secondary | ICD-10-CM | POA: Diagnosis present

## 2021-03-17 DIAGNOSIS — Z681 Body mass index (BMI) 19 or less, adult: Secondary | ICD-10-CM | POA: Diagnosis not present

## 2021-03-17 DIAGNOSIS — E860 Dehydration: Secondary | ICD-10-CM | POA: Diagnosis present

## 2021-03-17 DIAGNOSIS — E872 Acidosis, unspecified: Secondary | ICD-10-CM | POA: Diagnosis present

## 2021-03-17 DIAGNOSIS — Z20822 Contact with and (suspected) exposure to covid-19: Secondary | ICD-10-CM | POA: Diagnosis present

## 2021-03-17 DIAGNOSIS — R532 Functional quadriplegia: Secondary | ICD-10-CM | POA: Diagnosis present

## 2021-03-17 DIAGNOSIS — I340 Nonrheumatic mitral (valve) insufficiency: Principal | ICD-10-CM

## 2021-03-17 DIAGNOSIS — Z86718 Personal history of other venous thrombosis and embolism: Secondary | ICD-10-CM

## 2021-03-17 DIAGNOSIS — D631 Anemia in chronic kidney disease: Secondary | ICD-10-CM | POA: Diagnosis present

## 2021-03-17 DIAGNOSIS — I5032 Chronic diastolic (congestive) heart failure: Secondary | ICD-10-CM | POA: Diagnosis present

## 2021-03-17 DIAGNOSIS — G9340 Encephalopathy, unspecified: Secondary | ICD-10-CM

## 2021-03-17 DIAGNOSIS — T83518A Infection and inflammatory reaction due to other urinary catheter, initial encounter: Principal | ICD-10-CM | POA: Diagnosis present

## 2021-03-17 DIAGNOSIS — D649 Anemia, unspecified: Secondary | ICD-10-CM

## 2021-03-17 DIAGNOSIS — I083 Combined rheumatic disorders of mitral, aortic and tricuspid valves: Secondary | ICD-10-CM | POA: Diagnosis present

## 2021-03-17 DIAGNOSIS — Z888 Allergy status to other drugs, medicaments and biological substances status: Secondary | ICD-10-CM | POA: Diagnosis not present

## 2021-03-17 DIAGNOSIS — Z96641 Presence of right artificial hip joint: Secondary | ICD-10-CM | POA: Diagnosis present

## 2021-03-17 DIAGNOSIS — N390 Urinary tract infection, site not specified: Secondary | ICD-10-CM | POA: Diagnosis present

## 2021-03-17 DIAGNOSIS — Z85048 Personal history of other malignant neoplasm of rectum, rectosigmoid junction, and anus: Secondary | ICD-10-CM

## 2021-03-17 DIAGNOSIS — L97529 Non-pressure chronic ulcer of other part of left foot with unspecified severity: Secondary | ICD-10-CM | POA: Diagnosis present

## 2021-03-17 DIAGNOSIS — L89894 Pressure ulcer of other site, stage 4: Secondary | ICD-10-CM | POA: Diagnosis present

## 2021-03-17 DIAGNOSIS — R338 Other retention of urine: Secondary | ICD-10-CM | POA: Diagnosis present

## 2021-03-17 DIAGNOSIS — Y846 Urinary catheterization as the cause of abnormal reaction of the patient, or of later complication, without mention of misadventure at the time of the procedure: Secondary | ICD-10-CM | POA: Diagnosis present

## 2021-03-17 DIAGNOSIS — I081 Rheumatic disorders of both mitral and tricuspid valves: Secondary | ICD-10-CM | POA: Diagnosis not present

## 2021-03-17 DIAGNOSIS — Z87891 Personal history of nicotine dependence: Secondary | ICD-10-CM

## 2021-03-17 DIAGNOSIS — E43 Unspecified severe protein-calorie malnutrition: Secondary | ICD-10-CM | POA: Diagnosis present

## 2021-03-17 DIAGNOSIS — Z79899 Other long term (current) drug therapy: Secondary | ICD-10-CM

## 2021-03-17 DIAGNOSIS — Z8249 Family history of ischemic heart disease and other diseases of the circulatory system: Secondary | ICD-10-CM

## 2021-03-17 DIAGNOSIS — N1831 Chronic kidney disease, stage 3a: Secondary | ICD-10-CM | POA: Diagnosis present

## 2021-03-17 DIAGNOSIS — R7881 Bacteremia: Secondary | ICD-10-CM | POA: Diagnosis not present

## 2021-03-17 DIAGNOSIS — Z923 Personal history of irradiation: Secondary | ICD-10-CM

## 2021-03-17 DIAGNOSIS — R6520 Severe sepsis without septic shock: Secondary | ICD-10-CM | POA: Diagnosis present

## 2021-03-17 DIAGNOSIS — G9341 Metabolic encephalopathy: Secondary | ICD-10-CM | POA: Diagnosis present

## 2021-03-17 DIAGNOSIS — A419 Sepsis, unspecified organism: Secondary | ICD-10-CM | POA: Diagnosis present

## 2021-03-17 DIAGNOSIS — N179 Acute kidney failure, unspecified: Secondary | ICD-10-CM | POA: Diagnosis present

## 2021-03-17 DIAGNOSIS — M1990 Unspecified osteoarthritis, unspecified site: Secondary | ICD-10-CM | POA: Diagnosis not present

## 2021-03-17 DIAGNOSIS — Z7901 Long term (current) use of anticoagulants: Secondary | ICD-10-CM

## 2021-03-17 DIAGNOSIS — B20 Human immunodeficiency virus [HIV] disease: Secondary | ICD-10-CM | POA: Diagnosis present

## 2021-03-17 DIAGNOSIS — E8729 Other acidosis: Secondary | ICD-10-CM | POA: Diagnosis present

## 2021-03-17 DIAGNOSIS — I4892 Unspecified atrial flutter: Secondary | ICD-10-CM | POA: Diagnosis present

## 2021-03-17 DIAGNOSIS — B377 Candidal sepsis: Secondary | ICD-10-CM | POA: Diagnosis present

## 2021-03-17 DIAGNOSIS — Z66 Do not resuscitate: Secondary | ICD-10-CM | POA: Diagnosis present

## 2021-03-17 DIAGNOSIS — L89154 Pressure ulcer of sacral region, stage 4: Secondary | ICD-10-CM | POA: Diagnosis present

## 2021-03-17 DIAGNOSIS — M109 Gout, unspecified: Secondary | ICD-10-CM | POA: Diagnosis present

## 2021-03-17 DIAGNOSIS — L8990 Pressure ulcer of unspecified site, unspecified stage: Secondary | ICD-10-CM | POA: Diagnosis present

## 2021-03-17 DIAGNOSIS — Z809 Family history of malignant neoplasm, unspecified: Secondary | ICD-10-CM

## 2021-03-17 DIAGNOSIS — I509 Heart failure, unspecified: Secondary | ICD-10-CM | POA: Diagnosis not present

## 2021-03-17 LAB — COMPREHENSIVE METABOLIC PANEL
ALT: 19 U/L (ref 0–44)
AST: 23 U/L (ref 15–41)
Albumin: 2.7 g/dL — ABNORMAL LOW (ref 3.5–5.0)
Alkaline Phosphatase: 77 U/L (ref 38–126)
Anion gap: 17 — ABNORMAL HIGH (ref 5–15)
BUN: 52 mg/dL — ABNORMAL HIGH (ref 6–20)
CO2: 13 mmol/L — ABNORMAL LOW (ref 22–32)
Calcium: 10.7 mg/dL — ABNORMAL HIGH (ref 8.9–10.3)
Chloride: 109 mmol/L (ref 98–111)
Creatinine, Ser: 2.96 mg/dL — ABNORMAL HIGH (ref 0.61–1.24)
GFR, Estimated: 24 mL/min — ABNORMAL LOW (ref >60)
Glucose, Bld: 155 mg/dL — ABNORMAL HIGH (ref 70–99)
Potassium: 3.7 mmol/L (ref 3.5–5.1)
Sodium: 139 mmol/L (ref 135–145)
Total Bilirubin: 0.3 mg/dL (ref 0.3–1.2)
Total Protein: 9.1 g/dL — ABNORMAL HIGH (ref 6.5–8.1)

## 2021-03-17 LAB — APTT: aPTT: 33 seconds (ref 24–36)

## 2021-03-17 LAB — CBC WITH DIFFERENTIAL/PLATELET
Abs Immature Granulocytes: 0.52 10*3/uL — ABNORMAL HIGH (ref 0.00–0.07)
Basophils Absolute: 0.1 10*3/uL (ref 0.0–0.1)
Basophils Relative: 1 %
Eosinophils Absolute: 0.2 10*3/uL (ref 0.0–0.5)
Eosinophils Relative: 1 %
HCT: 30 % — ABNORMAL LOW (ref 39.0–52.0)
Hemoglobin: 8.3 g/dL — ABNORMAL LOW (ref 13.0–17.0)
Immature Granulocytes: 3 %
Lymphocytes Relative: 27 %
Lymphs Abs: 5.3 10*3/uL — ABNORMAL HIGH (ref 0.7–4.0)
MCH: 27.5 pg (ref 26.0–34.0)
MCHC: 27.7 g/dL — ABNORMAL LOW (ref 30.0–36.0)
MCV: 99.3 fL (ref 80.0–100.0)
Monocytes Absolute: 1 10*3/uL (ref 0.1–1.0)
Monocytes Relative: 5 %
Neutro Abs: 12.5 10*3/uL — ABNORMAL HIGH (ref 1.7–7.7)
Neutrophils Relative %: 63 %
Platelets: 551 10*3/uL — ABNORMAL HIGH (ref 150–400)
RBC: 3.02 MIL/uL — ABNORMAL LOW (ref 4.22–5.81)
RDW: 17.2 % — ABNORMAL HIGH (ref 11.5–15.5)
WBC: 19.7 10*3/uL — ABNORMAL HIGH (ref 4.0–10.5)
nRBC: 0 % (ref 0.0–0.2)

## 2021-03-17 LAB — LACTIC ACID, PLASMA
Lactic Acid, Venous: 4.4 mmol/L (ref 0.5–1.9)
Lactic Acid, Venous: 3.2 mmol/L (ref 0.5–1.9)

## 2021-03-17 LAB — URINALYSIS, ROUTINE W REFLEX MICROSCOPIC
Bilirubin Urine: NEGATIVE
Glucose, UA: NEGATIVE mg/dL
Ketones, ur: NEGATIVE mg/dL
Nitrite: NEGATIVE
Protein, ur: 30 mg/dL — AB
RBC / HPF: >50 RBC/hpf — ABNORMAL HIGH (ref 0–5)
Specific Gravity, Urine: 1.012 (ref 1.005–1.030)
WBC, UA: >50 WBC/hpf — ABNORMAL HIGH (ref 0–5)
pH: 5 (ref 5.0–8.0)

## 2021-03-17 LAB — I-STAT VENOUS BLOOD GAS, ED
Acid-base deficit: 12 mmol/L — ABNORMAL HIGH (ref 0.0–2.0)
Bicarbonate: 13.9 mmol/L — ABNORMAL LOW (ref 20.0–28.0)
Calcium, Ion: 1.31 mmol/L (ref 1.15–1.40)
HCT: 29 % — ABNORMAL LOW (ref 39.0–52.0)
Hemoglobin: 9.9 g/dL — ABNORMAL LOW (ref 13.0–17.0)
O2 Saturation: 96 %
Potassium: 3.8 mmol/L (ref 3.5–5.1)
Sodium: 142 mmol/L (ref 135–145)
TCO2: 15 mmol/L — ABNORMAL LOW (ref 22–32)
pCO2, Ven: 30.3 mmHg — ABNORMAL LOW (ref 44–60)
pH, Ven: 7.27 (ref 7.25–7.43)
pO2, Ven: 95 mmHg — ABNORMAL HIGH (ref 32–45)

## 2021-03-17 LAB — AMMONIA: Ammonia: <10 umol/L (ref 9–35)

## 2021-03-17 LAB — RESP PANEL BY RT-PCR (FLU A&B, COVID) ARPGX2
Influenza A by PCR: NEGATIVE
Influenza B by PCR: NEGATIVE
SARS Coronavirus 2 by RT PCR: NEGATIVE

## 2021-03-17 LAB — PROTIME-INR
INR: 1.8 — ABNORMAL HIGH (ref 0.8–1.2)
Prothrombin Time: 21.1 seconds — ABNORMAL HIGH (ref 11.4–15.2)

## 2021-03-17 LAB — POC OCCULT BLOOD, ED: Fecal Occult Bld: NEGATIVE

## 2021-03-17 MED ORDER — SODIUM CHLORIDE 0.9 % IV SOLN
2.0000 g | INTRAVENOUS | Status: DC
  Administered 2021-03-18: 2 g via INTRAVENOUS
  Filled 2021-03-17: qty 2

## 2021-03-17 MED ORDER — RILPIVIRINE HCL 25 MG PO TABS
25.0000 mg | ORAL_TABLET | Freq: Every day | ORAL | Status: DC
  Administered 2021-03-18 – 2021-03-23 (×4): 25 mg via ORAL
  Filled 2021-03-17 (×6): qty 1

## 2021-03-17 MED ORDER — DARUNAVIR-COBICISTAT 800-150 MG PO TABS
1.0000 | ORAL_TABLET | Freq: Every day | ORAL | Status: DC
  Administered 2021-03-18 – 2021-03-23 (×4): 1 via ORAL
  Filled 2021-03-17 (×6): qty 1

## 2021-03-17 MED ORDER — METRONIDAZOLE 500 MG/100ML IV SOLN
500.0000 mg | Freq: Once | INTRAVENOUS | Status: AC
  Administered 2021-03-17: 18:00:00 500 mg via INTRAVENOUS
  Filled 2021-03-17: qty 100

## 2021-03-17 MED ORDER — SODIUM CHLORIDE 0.9 % IV SOLN
2.0000 g | Freq: Once | INTRAVENOUS | Status: AC
  Administered 2021-03-17: 18:00:00 2 g via INTRAVENOUS
  Filled 2021-03-17: qty 2

## 2021-03-17 MED ORDER — VANCOMYCIN VARIABLE DOSE PER UNSTABLE RENAL FUNCTION (PHARMACIST DOSING): Status: DC

## 2021-03-17 MED ORDER — FAMOTIDINE 20 MG PO TABS
20.0000 mg | ORAL_TABLET | Freq: Every day | ORAL | Status: DC
  Administered 2021-03-18: 20 mg via ORAL
  Filled 2021-03-17 (×2): qty 1

## 2021-03-17 MED ORDER — METRONIDAZOLE 500 MG/100ML IV SOLN
500.0000 mg | Freq: Two times a day (BID) | INTRAVENOUS | Status: DC
  Administered 2021-03-18: 500 mg via INTRAVENOUS
  Filled 2021-03-17: qty 100

## 2021-03-17 MED ORDER — ACETAMINOPHEN 650 MG RE SUPP: 650.0000 mg | Freq: Four times a day (QID) | RECTAL | Status: DC | PRN

## 2021-03-17 MED ORDER — APIXABAN 2.5 MG PO TABS
2.5000 mg | ORAL_TABLET | Freq: Two times a day (BID) | ORAL | Status: DC
  Administered 2021-03-17 – 2021-03-23 (×9): 2.5 mg via ORAL
  Filled 2021-03-17 (×12): qty 1

## 2021-03-17 MED ORDER — VANCOMYCIN HCL 1250 MG/250ML IV SOLN
1250.0000 mg | Freq: Once | INTRAVENOUS | Status: AC
  Administered 2021-03-17: 19:00:00 1250 mg via INTRAVENOUS
  Filled 2021-03-17: qty 250

## 2021-03-17 MED ORDER — OXYCODONE HCL 5 MG PO TABS
5.0000 mg | ORAL_TABLET | Freq: Four times a day (QID) | ORAL | Status: DC | PRN
  Administered 2021-03-20 – 2021-03-22 (×4): 5 mg via ORAL
  Filled 2021-03-17 (×4): qty 1

## 2021-03-17 MED ORDER — ENSURE ENLIVE PO LIQD: 237.0000 mL | ORAL | Status: DC

## 2021-03-17 MED ORDER — LACTATED RINGERS IV BOLUS (SEPSIS)
1000.0000 mL | Freq: Once | INTRAVENOUS | Status: AC
  Administered 2021-03-17: 18:00:00 1000 mL via INTRAVENOUS

## 2021-03-17 MED ORDER — ACETAMINOPHEN 325 MG PO TABS: 650.0000 mg | ORAL_TABLET | Freq: Four times a day (QID) | ORAL | Status: DC | PRN

## 2021-03-17 MED ORDER — LACTATED RINGERS IV SOLN: INTRAVENOUS | Status: AC

## 2021-03-17 MED ORDER — DOLUTEGRAVIR SODIUM 50 MG PO TABS
50.0000 mg | ORAL_TABLET | Freq: Every day | ORAL | Status: DC
  Administered 2021-03-18 – 2021-03-23 (×4): 50 mg via ORAL
  Filled 2021-03-17 (×6): qty 1

## 2021-03-17 NOTE — ED Notes (Signed)
Pt's brief changed and a mepilex pad applied top the pt's sacral ulcer with PA at bedside.  ?

## 2021-03-17 NOTE — ED Provider Notes (Addendum)
[facility] EMERGENCY DEPARTMENT Provider Note   CSN: [id number] Arrival date & time: 03/17/21  1630     History  Chief Complaint  Patient presents with   Altered Mental Status   Wound Check    [name] [name] is a 61 y.o. 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  On arrival, patient is tachycardic, tachypneic, and appears mildly delirious according to the patient's sister.    Symptoms began 3 weeks ago and have continuously worsened.  [facility] has been treating him over the last 3 weeks for UTI and dehydration with an unknown antibiotic.  PICC line on arrival in the left upper arm, which was being used at [facility] for daily fluid replenishment.  Patient denies chest pain or SOB.  Endorses mild abdominal pain and intermittently bleeding from his rectum over the last week.  Hx of multidrug resistant HIV.  Last known CD4 107 on 05/2020.  The history is provided by the patient, medical records and a relative.  Altered Mental Status Presenting symptoms: confusion   Wound Check      Home Medications Prior to Admission medications   Medication Sig Start Date End Date Taking? Authorizing Provider  acetaminophen (TYLENOL) 325 MG tablet Take 2 tablets (650 mg total) by mouth every 6 (six) hours as needed for mild pain (or Fever >/= 101). 12/23/20  Yes Sheikh, Omair Latif, DO  Amino Acids-Protein Hydrolys (FEEDING SUPPLEMENT, PRO-STAT SUGAR FREE 64,) LIQD Take 30 mLs by mouth 3 (three) times daily.   Yes [provider]  apixaban (ELIQUIS) 2.5 MG TABS tablet Take 1 tablet (2.5 mg total) by mouth 2 (two) times daily. 12/23/20  Yes Sheikh, Omair Latif, DO  cefTRIAXone (ROCEPHIN) 1 g injection Inject 1 g into the muscle daily. 03/08/21  Yes [provider]   cyanocobalamin 100 MCG tablet Take 100 mcg by mouth daily.   Yes [provider]  cyclobenzaprine (FLEXERIL) 10 MG tablet Take 10 mg by mouth at bedtime. 02/09/21  Yes [provider]  darunavir-cobicistat (PREZCOBIX) 800-150 MG tablet TAKE 1 TABLET BY MOUTH DAILY . SWALLOW WHOLE, DO NOT CRUSH, BREAK, OR CHEW TABLETS. TAKE WITH FOOD Patient taking differently: Take 1 tablet by mouth daily. 08/19/20  Yes [name], MD  dextrose 5 % solution Inject into the vein. 03/07/21  Yes [provider]  famotidine (PEPCID) 20 MG tablet Take 1 tablet (20 mg total) by mouth daily. 12/23/20  Yes Sheikh, Teachey, DO  feeding supplement (ENSURE ENLIVE / ENSURE PLUS) LIQD Take 237 mLs by mouth daily. 12/23/20  Yes Sheikh, Omair Latif, DO  Lidocaine 4 % PTCH Apply topically. Apply to lumbar spine topically at bedtime and for pain; remove per schedule   Yes [provider]  Nutritional Supplements (,FEEDING SUPPLEMENT, PROSOURCE PLUS) liquid Take 30 mLs by mouth 2 (two) times daily between meals. 12/23/20  Yes Sheikh, Omair Latif, DO  oxycodone (OXY-IR) 5 MG capsule Take 1 capsule (5 mg total) by mouth See admin instructions. Give '5mg'$  by mouth daily for wound care. May also give '5mg'$  every 6 hours as needed for chronic pain 12/23/20  Yes Sheikh, Omair Latif, DO  polycarbophil (FIBERCON) 625 MG tablet Take 625 mg by mouth daily.   Yes [provider]  rilpivirine (EDURANT) 25 MG TABS  tablet TAKE 1 TABLET (25 MG TOTAL) BY MOUTH DAILY WITH BREAKFAST Patient taking differently: Take 25 mg by mouth daily with breakfast. 08/19/20  Yes [name], MD  senna-docusate (SENOKOT-S) 8.6-50 MG tablet Take 2 tablets by mouth at bedtime as needed for mild constipation.   Yes [provider]  simethicone (MYLICON) 80 MG chewable tablet Chew 80 mg by mouth with breakfast, with lunch, and with evening meal.   Yes [provider]  TIVICAY 50 MG tablet TAKE 1  TABLET (50 MG TOTAL) BY MOUTH DAILY Patient taking differently: Take 50 mg by mouth daily. 08/19/20  Yes [name], MD  vitamin C (ASCORBIC ACID) 500 MG tablet Take 1,000 mg by mouth daily.   Yes [provider]  zinc sulfate 220 (50 Zn) MG capsule Take 1 capsule (220 mg total) by mouth daily. 12/24/20  Yes Sheikh, Omair Latif, DO  bisacodyl (DULCOLAX) 10 MG suppository Place 10 mg rectally once as needed for moderate constipation. Patient not taking: Reported on 03/18/2021    [provider]  cephALEXin (KEFLEX) 500 MG capsule Take 500 mg by mouth daily. Patient not taking: Reported on 03/17/2021 02/24/21   [provider]  loperamide (IMODIUM A-D) 2 MG tablet Take 2-4 mg by mouth as needed for diarrhea or loose stools (max dose of '16mg'$ /24 hours). '4mg'$  after 1st loose stool and '2mg'$  after each subsequent BM Patient not taking: Reported on 03/18/2021    [provider]  nutrition supplement, JUVEN, (JUVEN) PACK Take 1 packet by mouth 2 (two) times daily between meals. Patient not taking: Reported on 03/18/2021 12/23/20   Raiford Noble Latif, DO  ondansetron (ZOFRAN) 4 MG tablet Take 1 tablet (4 mg total) by mouth every 6 (six) hours as needed for nausea. Patient not taking: Reported on 03/18/2021 12/23/20   Raiford Noble Latif, DO  Zinc Oxide (TRIPLE PASTE) 12.8 % ointment Apply topically 3 (three) times daily. Patient not taking: Reported on 03/17/2021 12/23/20   Raiford Noble Latif, DO      Allergies    Collagen    Review of Systems   Review of Systems  Constitutional:  Positive for activity change.  Psychiatric/Behavioral:  Positive for confusion.    Physical Exam Updated Vital Signs BP 117/70 (BP Location: Right Arm)    Pulse 95    Temp 98.6 F (37 C) (Oral)    Resp 16    Ht '6\' 2"'$  (1.88 m)    Wt 57.7 kg    SpO2 100%    BMI 16.34 kg/m  Physical Exam Vitals and nursing note reviewed. Exam conducted with a chaperone present.  Constitutional:       Appearance: He is well-developed. He is ill-appearing. He is not diaphoretic.  HENT:     Head: Normocephalic and atraumatic.     Nose: Nose normal.     Mouth/Throat:     Mouth: Mucous membranes are dry.     Pharynx: Oropharynx is clear.  Eyes:     General: No scleral icterus.    Conjunctiva/sclera: Conjunctivae normal.  Cardiovascular:     Rate and Rhythm: Normal rate and regular rhythm.     Heart sounds: Normal heart sounds. No murmur heard. Pulmonary:     Effort: Pulmonary effort is normal. No respiratory distress.     Breath sounds: Normal breath sounds.  Abdominal:     General: Bowel sounds are normal.     Palpations: Abdomen is soft.     Tenderness: There is  abdominal tenderness in the periumbilical area and suprapubic area.  Genitourinary:      Comments: Erythematous, tender, with discharge Musculoskeletal:        General: No swelling.     Cervical back: Neck supple.       Feet:  Feet:     Right foot:     Skin integrity: Dry skin present.     Toenail Condition: Fungal disease present.    Left foot:     Skin integrity: Ulcer, skin breakdown and dry skin present.     Toenail Condition: Fungal disease present. Skin:    General: Skin is warm and dry.     Capillary Refill: Capillary refill takes less than 2 seconds.     Coloration: Skin is not jaundiced.       Neurological:     Mental Status: He is alert. He is disoriented.  Psychiatric:        Mood and Affect: Mood normal.            ED Results / Procedures / Treatments   Labs (all labs ordered are listed, but only abnormal results are displayed) Labs Reviewed  LACTIC ACID, PLASMA - Abnormal; Notable for the following components:      Result Value   Lactic Acid, Venous 4.4 (*)    All other components within normal limits  LACTIC ACID, PLASMA - Abnormal; Notable for the following components:   Lactic Acid, Venous 3.2 (*)    All other components within normal limits  COMPREHENSIVE METABOLIC PANEL -  Abnormal; Notable for the following components:   CO2 13 (*)    Glucose, Bld 155 (*)    BUN 52 (*)    Creatinine, Ser 2.96 (*)    Calcium 10.7 (*)    Total Protein 9.1 (*)    Albumin 2.7 (*)    GFR, Estimated 24 (*)    Anion gap 17 (*)    All other components within normal limits  CBC WITH DIFFERENTIAL/PLATELET - Abnormal; Notable for the following components:   WBC 19.7 (*)    RBC 3.02 (*)    Hemoglobin 8.3 (*)    HCT 30.0 (*)    MCHC 27.7 (*)    RDW 17.2 (*)    Platelets 551 (*)    Neutro Abs 12.5 (*)    Lymphs Abs 5.3 (*)    Abs Immature Granulocytes 0.52 (*)    All other components within normal limits  PROTIME-INR - Abnormal; Notable for the following components:   Prothrombin Time 21.1 (*)    INR 1.8 (*)    All other components within normal limits  URINALYSIS, ROUTINE W REFLEX MICROSCOPIC - Abnormal; Notable for the following components:   Color, Urine AMBER (*)    APPearance TURBID (*)    Hgb urine dipstick MODERATE (*)    Protein, ur 30 (*)    Leukocytes,Ua LARGE (*)    RBC / HPF >50 (*)    WBC, UA >50 (*)    Bacteria, UA MANY (*)    Non Squamous Epithelial 0-5 (*)    All other components within normal limits  I-STAT VENOUS BLOOD GAS, ED - Abnormal; Notable for the following components:   pCO2, Ven 30.3 (*)    pO2, Ven 95 (*)    Bicarbonate 13.9 (*)    TCO2 15 (*)    Acid-base deficit 12.0 (*)    HCT 29.0 (*)    Hemoglobin 9.9 (*)    All other components within  normal limits  RESP PANEL BY RT-PCR (FLU A&B, COVID) ARPGX2  CULTURE, BLOOD (ROUTINE X 2)  CULTURE, BLOOD (ROUTINE X 2)  URINE CULTURE  APTT  AMMONIA  MAGNESIUM  MAGNESIUM  COMPREHENSIVE METABOLIC PANEL  CBC WITH DIFFERENTIAL/PLATELET  LACTIC ACID, PLASMA  CK  C-REACTIVE PROTEIN  SEDIMENTATION RATE  CALCIUM, IONIZED  METHYLMALONIC ACID, SERUM  TSH  RAPID URINE DRUG SCREEN, HOSP PERFORMED  PHOSPHORUS  PROCALCITONIN  PREALBUMIN  SODIUM, URINE, RANDOM  CREATININE, URINE, RANDOM   T-HELPER CELLS (CD4) COUNT (NOT AT [facility])  POC OCCULT BLOOD, ED    EKG EKG Interpretation  Date/Time:  Thursday March 17 2021 16:37:22 EST Ventricular Rate:  122 PR Interval:    QRS Duration: 82 QT Interval:  336 QTC Calculation: 478 R Axis:   80 Text Interpretation: Atrial fibrillation with rapid ventricular response Nonspecific ST abnormality Abnormal ECG When compared with ECG of 12-May-2020 22:26, PREVIOUS ECG IS PRESENT Confirmed by Lavenia Atlas [id number]) on 03/17/2021 5:44:25 PM  Radiology DG Chest Port 1 View  Result Date: 03/17/2021 CLINICAL DATA:  Questionable sepsis.  Tachypnea and tachycardia. EXAM: PORTABLE CHEST 1 VIEW COMPARISON:  Radiograph 10/09/2020 FINDINGS: Biapical pleuroparenchymal scarring.The cardiomediastinal contours are normal. The lungs are clear. Pulmonary vasculature is normal. No consolidation, pleural effusion, or pneumothorax. No acute osseous abnormalities are seen. IMPRESSION: Biapical pleuroparenchymal scarring. No acute findings. Electronically Signed   By: Keith Rake M.D.   On: 03/17/2021 18:26    Procedures .Critical Care Performed by: Prince Rome, PA-C Authorized by: Prince Rome, PA-C   Critical care provider statement:    Critical care time (minutes):  65   Critical care was necessary to treat or prevent imminent or life-threatening deterioration of the following conditions:  Sepsis   Critical care was time spent personally by me on the following activities:  Development of treatment plan with patient or surrogate, discussions with consultants, evaluation of patient's response to treatment, examination of patient, ordering and review of laboratory studies, ordering and review of radiographic studies, ordering and performing treatments and interventions, pulse oximetry, re-evaluation of patient's condition, review of old charts, blood draw for specimens, obtaining history from patient or surrogate and vascular access procedures    Care discussed with: admitting provider      Medications Ordered in ED Medications  lactated ringers infusion ( Intravenous New Bag/Given 03/17/21 1752)  vancomycin (VANCOREADY) IVPB 1250 mg/250 mL (1,250 mg Intravenous New Bag/Given 03/17/21 1919)  ceFEPIme (MAXIPIME) 2 g in sodium chloride 0.9 % 100 mL IVPB (has no administration in time range)  vancomycin variable dose per unstable renal function (pharmacist dosing) (has no administration in time range)  ceFEPIme (MAXIPIME) 2 g in sodium chloride 0.9 % 100 mL IVPB (0 g Intravenous Stopped 03/17/21 1852)  metroNIDAZOLE (FLAGYL) IVPB 500 mg (0 mg Intravenous Stopped 03/17/21 1858)  lactated ringers bolus 1,000 mL (0 mLs Intravenous Stopped 03/17/21 1859)    And  lactated ringers bolus 1,000 mL (0 mLs Intravenous Stopped 03/17/21 1906)    ED Course/ Medical Decision Making/ A&P                           Medical Decision Making Amount and/or Complexity of Data Reviewed Independent Historian: caregiver    Details: Sister External Data Reviewed: notes. Labs: ordered. Decision-making details documented in ED Course. Radiology: ordered and independent interpretation performed. Decision-making details documented in ED Course. ECG/medicine tests: ordered and independent interpretation performed. Decision-making details documented  in ED Course.  Risk OTC drugs. Prescription drug management. Decision regarding hospitalization.  Critical Care Total time providing critical care: 62 minutes  61 y.o. male presents to the ED for concern of Altered Mental Status and possible sepsis.  This involves an extensive number of treatment options, and is a complaint that carries with it a high risk of complications and morbidity.  The differential diagnosis includes severe sepsis, sepsis, cellulitis, ulceration, urinary tract infection, pneumonia  Comorbidities that complicate the patient evaluation include multidrug-resistant HIV, prior severe sepsis, prior  encephalopathy, chronic diastolic heart failure, chronic kidney disease stage III, prior malignant neoplasm of the anus, functional quadriplegic, hepatitis B, prior DVT/VTE, chronic anticoagulation with Eliquis  Additional history obtained from internal/external records available via epic  Interpretation: I ordered, and personally interpreted labs.  The pertinent results include: PT/INR within expected range.  Elevated WBC of 19.7 with anemia of hemoglobin 8.3 and hematocrit of 30.  Poor kidney function with a BUN of 52 and creatinine of 2.96 and GFR of 24..  Ammonia levels negative.  Urinalysis indicates significant red blood cell and white blood cell and bacteria and fungal presence.  Initial lactic acid of 4.4, with vitals on presentation patient meets criteria for severe sepsis.  I ordered imaging studies including chest x-ray.  I independently visualized and interpreted imaging which showed biapical pleural-parenchymal scarring without any acute findings.  I agree with the radiologist interpretation   Intervention: I ordered medication including fluids, Flagyl, cefepime, and vancomycin for fluids and aggressive empiric antibiotic treatment.  Reevaluation of the patient after these medicines showed that the patient appears less dehydrated on physical exam.  I have reviewed the patients home medicines and have made adjustments as needed  Critical interventions include severe sepsis treatment  The patient was maintained on a cardiac monitor.  I personally viewed and interpreted the cardiac monitored which showed an underlying rhythm of: Sinus tachycardia  I requested consultation with the hospitalist team and infectious disease, and discussed lab and imaging findings as well as pertinent plan - they recommend: Inpatient admission for further treatment and diagnostics  ED Course: Patient presented tachycardic, tachypneic, with multiple sources of infection, and a significantly dehydrated.   Patient is usually a functional quadriplegic, but according to the family member patient was acutely delirious that is increased over the last 3 weeks.  Patient was previously being treated for UTI at a healthcare facility with an unknown antibiotic.  Also has chronic ulcers of the left foot and sacral region for almost a year or more.  Appreciated ulcers to be weeping and deep and infected on physical exam.  Hemoccult negative though patient had complained of hematochezia intermittently over the last 3+ weeks.  History of rectal cancer.  Chest x-ray not suggestive of pneumonia or cause of sepsis.  Considered hyperglycemia or hypoglycemia for possible cause of altered mental status/delirium, but physical exam and labs and imaging not supportive of this.  Patient arrived with a PICC line in his left upper arm, which patient's sister states is being used to provide hydration on a daily basis.  Patient's sister denies known fever.  Elevated white blood cell count and vital signs with multiple causes of infection and an elevated lactate of 4.4 allow the patient to meet severe sepsis criteria.  Patient's previous history of multidrug-resistant HIV and most recent CD4 level of 107 prompted referral for infectious disease.  Patient able to talk, is interactive, and is not hypotensive, therefore may not meet ICU treatment criteria.  Overall patient is very high risk and I am concerned for multi-pathogenic cause of severe sepsis infection.  I believe the patient may best be served by hospital admission for further treatment  Social Determinants of Health include transportation needs and physical activity  Disposition: I discussed the patient and their case with my attending, Dr. Dina Rich, who agreed with the proposed treatment course.  After consideration of the diagnostic results and the patient's response to treatment, I feel that the patent would benefit from inpatient admission for further treatment and diagnostics.   Discussed course of treatment thoroughly with the patient and his sister, whom demonstrated understanding.  Patient in agreement and has no further questions.   Final Clinical Impression(s) / ED Diagnoses Final diagnoses:  Severe sepsis [facility])    Rx / DC Orders ED Discharge Orders     None         Prince Rome, PA-C 14/70/92 [id number]    Prince Rome, PA-C 73/40/37 0224    Dina Rich, Alvin Critchley, DO 03/19/21 1548

## 2021-03-17 NOTE — Sepsis Progress Note (Signed)
Elink following Code Sepsis. 

## 2021-03-17 NOTE — Assessment & Plan Note (Addendum)
Patient presenting to the ED with 2-3 days of confusion relative to his baseline mental status.  Likely multifactorial in the setting of sepsis, dehydration, AKI on CKD stage IIIa, hypercalcemia and anion gap metabolic acidosis.  Ammonia level within normal limits.  Treated with antibiotics, IV fluid hydration with resolution of encephalopathy, now back to his baseline mental status.   --Continue treatment as below

## 2021-03-17 NOTE — Assessment & Plan Note (Addendum)
He presented with 2 to 3 days of confusion, leukocytosis, tachycardia, tachypnea, lactic acid initially 4.4 which resolved with IV hydration.  Acute metabolic encephalopathy which now resolved. Chest X-ray no evidence of acute cardiopulmonary process.   Procalcitonin 0.29.  COVID/influenza a/B PCR negative. UA significant pyuria in the context of chronic indwelling Foley catheter. In terms of source of suspected underlying infection, possibilities include infected left lateral foot wound as well as infected sacral decubitus ulcer.  --Infectious disease following, appreciate assistance --TTE with thickened MV leaflets, LV EF 55-60%, significant MR. --TEE: Pending 3/14 --Blood Cx 3/9: + CANDIDA ALBICANS; susceptibilities pending --Repeat blood cultures 3/11: No growth x2 days --Fluconazole 200 mg IV q24h --Will need outpatient ophthalmology evaluation for endophthalmitis

## 2021-03-17 NOTE — ED Notes (Signed)
Pt presents w/ increased weakness.  Family members reports Pt has a significant wound on back/bottom and foot.  Pt was directed to ED for septic work-up.   ?

## 2021-03-17 NOTE — Assessment & Plan Note (Addendum)
CD4 count 487. Follows with ID outpatient, [name]. --Continue Tivicay, Prezcobix, and Endurant for HIV ART

## 2021-03-17 NOTE — Assessment & Plan Note (Addendum)
Baseline serum creatinine 1.4-1.7.  Creatinine on admission 2.96. Likely prerenal in the setting of sepsis.   --Cr 2.96>>2.32 --Continue LR at 100 mL/hr --Avoid nephrotoxic medications, renally dose all medications --BMP daily

## 2021-03-17 NOTE — Assessment & Plan Note (Addendum)
Calcium 10.7 on admission, likely secondary to severe dehydration resolved with IV fluid hydration. ?

## 2021-03-17 NOTE — ED Triage Notes (Signed)
Pt sent here by PCP for admission due to wound on L foot, tachypnea and tachycardia w/ UTI symptoms and increased confusion. Pt was brought here by sister, lives at [facility]. ?

## 2021-03-17 NOTE — ED Notes (Signed)
Pt offered pain medications and pt denies needing any at this time  ?

## 2021-03-17 NOTE — Assessment & Plan Note (Addendum)
TTE with LVEF 55-60%, significant MR. --TEE: Pending as above --Strict I's and O's Daily weights

## 2021-03-17 NOTE — H&P (Addendum)
History and Physical    PLEASE NOTE THAT DRAGON DICTATION SOFTWARE WAS USED IN THE CONSTRUCTION OF THIS NOTE.   [name] [name] ZYS:[id number] DOB: 1960-06-19 DOA: 03/17/2021  PCP: [name], MD  Patient coming from: home   I have personally briefly reviewed patient's old medical records in [place]  Chief Complaint: Altered mental status  HPI: [name] [name] is a 61 y.o. 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   The following history is provided via the patient's sister in addition to my discussions with the EDP and via chart review.  Subsequently the patient has exhibited evidence of 2-3 days of confusion relative to his baseline mental status.  She reports that he is experienced similar confusion at times of previous underlying infections.  She notes that he recently underwent IV antibiotics in the form of Rocephin for suspected urinary tract infection via PICC line as an outpatient, the presenting confusion started after completion of this Rocephin regimen.  He has a history of left lateral foot wound, dating back to May 2022, for which he receives wound care as an outpatient.  Per sister, the left lateral foot wound has been improving over the preceding weeks, although there has been some serous drainage associated with this wound, in the absence of any reported purulent drainage.  He also has a documented history of chronic sacral decubitus ulcer, which  is also being managed on chronic basis via outpatient wound care, with sister reporting that this wound is also improving over the last 3 weeks.   Otherwise, no reported new rash.  Patient without complaint of new onset cough or shortness of breath.  No reported recent trauma or fall.  The patient has a history of HIV/AIDS, with most recent absolute CD4 count noted to be 167 in October 2022.  He follows with [name] as his outpatient infectious disease physician, and is on chronic antiviral therapy, including via Tivicay, Darunavir-cobicistat, and Rilpivirine.   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  In the setting has a chronic indwelling Foley catheter.       ED Course:  Vital signs in the ED were notable for the following: Afebrile; initial heart rate 123, which is decreased to 94 following interval IV fluids, as further detailed below; blood pressure 109/78/*; respiratory rate 16-25; oxygen titration 100% on room air.  Labs were notable for the following: VBG notable for 7.27/30.3.  CMP notable for bicarbonate 13, anion gap 17, creatinine 2.96 relative to most recent prior serum creatinine 2.0 of 1.47 on 01/31/2021, calcium, adjusted for mild hypoalbuminemia 11.7, albumin 2.7, additional liver enzymes found to be within normal limits.  Serum ammonia level less than 10.  Initial lactate 4.4, with repeat value trending down to 3.2.  CBC notable for white cell count 90,700 with 63% neutrophils, relative to 12.2 on 01/31/2021, hemoglobin 8.3.  Urinalysis in the setting of specimen collected via chronic indwelling Foley catheter was notable for the following:  Greater than 50 blood cells, many bacteria, large leukocyte esterase, nitrate negative, 30 protein.  COVID-19/influenza PCR negative.  Blood cultures x2 as well as her culture collected prior to initiation of IV  antibiotics.  Imaging and additional notable ED work-up: EKG obtained, although the interpretation subsequent by the presence of artifact, with repeat EKG ordered and result currently pending.  Chest x-ray shows no evidence of acute cardiopulmonary process, including no evidence of infiltrate.   EDP please consult with infectious disease, although no infectious disease coverage available overnight.   While in the ED, the following were administered: IV vancomycin, cefepime, Flagyl; lactated Ringer's x2 L bolus followed by initiation of continuous LR at 150 cc/h.  Subsequently, the patient was admitted for further evaluation management of suspected acute metabolic encephalopathy in the setting of severe sepsis of unclear underlying infectious source, with presentation also notable for acute kidney injury superimposed on CKD 3A as well as hypercalcemia, and the presence of anion gap metabolic acidosis.     Review of Systems: As per HPI otherwise 10 point review of systems negative.   Past Medical History:  Diagnosis Date   Arthritis    Decubitus ulcer of sacral area    DVT (deep venous thrombosis) ([place]) 2008   Left leg   Exposure to hepatitis B    H/O hypotension    History of anemia    History of gout    History of transfusion    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    HIV (human immunodeficiency virus infection) ([place])    Hx of sepsis 2015   affected rt hip / femur   Pain in limb 07/23/2013   PONV (postoperative nausea and vomiting)    Hiccups for 4 days after anesthesia   Radiation    Rectal cancer (HCC)    Squamous cell   Shingles    left shoulder, right leg    Past Surgical History:  Procedure Laterality Date   COLONOSCOPY     CONVERSION TO TOTAL HIP Right 06/15/2014   Procedure: REMOVAL OF ANTIBIOTIC  SPACER AND CONVERSION TO RIGHT TOTAL HIP  ARTHROPLASTY ;  Surgeon: Paralee Cancel, MD;  Location: [facility];  Service: Orthopedics;  Laterality: Right;   CYSTOSCOPY      FLEXIBLE SIGMOIDOSCOPY N/A 12/17/2020   Procedure: FLEXIBLE SIGMOIDOSCOPY;  Surgeon: Gatha Mayer, MD;  Location: [facility];  Service: Endoscopy;  Laterality: N/A;   HEMATOMA EVACUATION     HERNIA REPAIR     right X2   INCISION AND DRAINAGE HIP Right 07/31/2013   Procedure: IRRIGATION AND DEBRIDEMENT HIP WITH PERCUTANEOUS DRAIN PLACEMENT;  Surgeon: [name], MD;  Location: [place];  Service: Orthopedics;  Laterality: Right;   INCISION AND DRAINAGE HIP Right 03/15/2015   Procedure: IRRIGATION AND DEBRIDEMENT RIGHT HIP WITH  HEAD BALL  AND POLY LINER EXCHANGE;  Surgeon: Paralee Cancel, MD;  Location: [facility];  Service: Orthopedics;  Laterality: Right;   INCISION AND DRAINAGE OF WOUND N/A 08/25/2013   Procedure: IRRIGATION AND DEBRIDEMENT OF SACRAL ULCER WITH PLACEMENT OF A CELL ;  Surgeon: Theodoro Kos, DO;  Location: [place];  Service: Plastics;  Laterality: N/A;   INCISION AND DRAINAGE OF WOUND N/A 09/01/2013   Procedure: IRRIGATION AND DEBRIDEMENT SACRAL ULCER WITH PLACEMENT OF A CELL;  Surgeon: Theodoro Kos, DO;  Location: [place];  Service: Plastics;  Laterality: N/A;   INCISION AND DRAINAGE OF WOUND N/A 09/08/2013   Procedure: IRRIGATION AND DEBRIDEMENT OF  SACRAL ULCER WITH PLACEMENT OF A CELL AND VAC;  Surgeon: Theodoro Kos, DO;  Location: [place];  Service: Plastics;  Laterality: N/A;   INCISION AND DRAINAGE OF WOUND N/A 09/18/2013   Procedure: IRRIGATION AND DEBRIDEMENT OF SACRAL WOUND WITH PLACEMENT OF A-CELL;  Surgeon: Theodoro Kos, DO;  Location: [place];  Service: Plastics;  Laterality: N/A;   PILONIDAL CYST DRAINAGE N/A 08/09/2013   Procedure: IRRIGATION AND DEBRIDEMENT SACRAL DECUBITUS;  Surgeon: Harl Bowie, MD;  Location: [place];  Service: General;  Laterality: N/A;   RECTAL BIOPSY N/A 03/29/2020   Procedure: EXCISION OF PERIANAL TISSUE WITH BX AND 3 PUNCH BXS;  Surgeon: Ileana Roup, MD;  Location[facility];  Service: General;  Laterality: N/A;   TOTAL HIP ARTHROPLASTY  Right 08/04/2013   Procedure: RESECTION HIP JOINT - PLACEMENT OF CEMENT PROSTHESIS;  Surgeon: [name], MD;  Location: [place];  Service: Orthopedics;  Laterality: Right;   TRANSURETHRAL RESECTION OF PROSTATE  JAN 2016    Social History:  reports that he has quit smoking. His smoking use included cigarettes. He smoked an average of .3 packs per day. He has never used smokeless tobacco. He reports that he does not currently use alcohol after a past usage of about 3.0 standard drinks per week. He reports that he does not currently use drugs after having used the following drugs: Marijuana.   Allergies  Allergen Reactions   Collagen Rash    redness    Family History  Problem Relation Age of Onset   Diabetes Mother    Dementia Mother    Atrial fibrillation Mother    Hypertension Mother    Cancer Father    Emphysema Father     Family history reviewed and not pertinent    Prior to Admission medications   Medication Sig Start Date End Date Taking? Authorizing Provider  acetaminophen (TYLENOL) 325 MG tablet Take 2 tablets (650 mg total) by mouth every 6 (six) hours as needed for mild pain (or Fever >/= 101). 12/23/20   Sheikh, Georgina Quint Latif, DO  Amino Acids-Protein Hydrolys (FEEDING SUPPLEMENT, PRO-STAT SUGAR FREE 64,) LIQD Take 30 mLs by mouth 3 (three) times daily. Patient not taking: Reported on 03/17/2021    [provider]  apixaban (ELIQUIS) 2.5 MG TABS tablet Take 1 tablet (2.5 mg total) by mouth 2 (two) times daily. 12/23/20   Raiford Noble Latif, DO  bisacodyl (DULCOLAX) 10 MG suppository Place 10 mg rectally once as needed for moderate constipation.    [provider]  cefTRIAXone (ROCEPHIN) 1 g injection Inject 1 g into the muscle daily. Patient not taking: Reported on 03/17/2021 03/08/21   [provider]  cephALEXin (KEFLEX) 500 MG capsule Take 500 mg by mouth daily. Patient not taking: Reported on 03/17/2021 02/24/21   [provider]   cyanocobalamin 100 MCG tablet Take 100 mcg by mouth daily. Patient not taking: Reported on 03/17/2021    [provider]  cyclobenzaprine (FLEXERIL) 10 MG tablet Take 10 mg by mouth at bedtime. 02/09/21   [provider]  darunavir-cobicistat (PREZCOBIX) 800-150 MG tablet TAKE 1 TABLET BY MOUTH DAILY . SWALLOW WHOLE, DO NOT CRUSH, BREAK, OR CHEW TABLETS. TAKE WITH FOOD Patient taking differently: Take 1 tablet by mouth daily. 08/19/20   [name], MD  dextrose 5 % solution Inject into the vein. Patient not taking: Reported on 03/17/2021 03/07/21   [provider]  famotidine (PEPCID) 20 MG tablet Take 1 tablet (20  mg total) by mouth daily. 12/23/20   Raiford Noble Latif, DO  feeding supplement (ENSURE ENLIVE / ENSURE PLUS) LIQD Take 237 mLs by mouth daily. 12/23/20   Raiford Noble Latif, DO  Lidocaine 4 % PTCH Apply topically. Apply to lumbar spine topically at bedtime and for pain; remove per schedule    [provider]  loperamide (IMODIUM A-D) 2 MG tablet Take 2-4 mg by mouth as needed for diarrhea or loose stools (max dose of 76m/24 hours). [id number] 1st loose stool and 35m71mfter each subsequent BM    [provider]  nutrition supplement, JUVEN, (JUVEN) PACK Take 1 packet by mouth 2 (two) times daily between meals. 12/23/20   SheRaiford Nobletif, DO  Nutritional Supplements (,FEEDING SUPPLEMENT, PROSOURCE PLUS) liquid Take 30 mLs by mouth 2 (two) times daily between meals. 12/23/20   Sheikh, Omair Latif, DO  ondansetron (ZOFRAN) 4 MG tablet Take 1 tablet (4 mg total) by mouth every 6 (six) hours as needed for nausea. 12/23/20   SheRaiford Nobletif, DO  oxycodone (OXY-IR) 5 MG capsule Take 1 capsule (5 mg total) by mouth See admin instructions. Give 5mg100m mouth daily for wound care. May also give 5mg 85mry 6 hours as needed for chronic pain 12/23/20   Sheikh, Omair Latif, DO  polycarbophil (FIBERCON) 625 MG tablet Take 625 mg by mouth daily.     [provider]  rilpivirine (EDURANT) 25 MG TABS tablet TAKE 1 TABLET (25 MG TOTAL) BY MOUTH DAILY WITH BREAKFAST Patient taking differently: Take 25 mg by mouth daily with breakfast. 08/19/20   HatchCampbell Riches simethicone (MYLICON) 80 MG chewable tablet Chew 80 mg by mouth with breakfast, with lunch, and with evening meal.    [provider]  TIVICAY 50 MG tablet TAKE 1 TABLET (50 MG TOTAL) BY MOUTH DAILY Patient taking differently: Take 50 mg by mouth daily. 08/19/20   HatchCampbell Riches vitamin C (ASCORBIC ACID) 500 MG tablet Take 1,000 mg by mouth daily.    [provider]  Zinc Oxide (TRIPLE PASTE) 12.8 % ointment Apply topically 3 (three) times daily. Patient not taking: Reported on 03/17/2021 12/23/20   SheikRaiford Noblef, DO  zinc sulfate 220 (50 Zn) MG capsule Take 1 capsule (220 mg total) by mouth daily. Patient not taking: Reported on 03/17/2021 12/24/20   SheikKerney Elbe    Objective    Physical Exam: Vitals:   03/17/21 2130 03/17/21 2145 03/17/21 2200 03/17/21 2215  BP: 125/73 137/88 126/75 127/76  Pulse: 95 95 95 95  Resp: _0 Temp:      TempSrc:      SpO2: 100% 100% 100% 100%    General: appears to be stated age; alert, confused Skin: warm, dry, no rash; wound associated with the lateral aspect of the dorsum of the left foot at the base of the fifth digit, associated with erythema and serous drainage, in addition to sacral decubitus ulcer. Head:  AT/[place] Mouth:  Oral mucosa membranes appear dry, normal dentition Neck: supple; trachea midline Heart:  RRR; did not appreciate any M/R/G Lungs: CTAB, did not appreciate any wheezes, rales, or rhonchi Abdomen: + BS; soft, ND, NT Vascular: 2+ pedal pulses b/l; 2+ radial pulses b/l Extremities: no peripheral edema, no muscle wasting; left foot wound, as further detailed above.     Labs on Admission: I have personally reviewed following labs and imaging  studies  CBC: Recent  Labs  Lab 03/17/21 1735 03/17/21 1755  WBC 19.7*  --   NEUTROABS 12.5*  --   HGB 8.3* 9.9*  HCT 30.0* 29.0*  MCV 99.3  --   PLT 551*  --    Basic Metabolic Panel: Recent Labs  Lab 03/17/21 1735 03/17/21 1755  NA 139 142  K 3.7 3.8  CL 109  --   CO2 13*  --   GLUCOSE 155*  --   BUN 52*  --   CREATININE 2.96*  --   CALCIUM 10.7*  --    GFR: Estimated Creatinine Clearance: 22.4 mL/min (A) (by C-G formula based on SCr of 2.96 mg/dL (H)). Liver Function Tests: Recent Labs  Lab 03/17/21 1735  AST 23  ALT 19  ALKPHOS 77  BILITOT 0.3  PROT 9.1*  ALBUMIN 2.7*   No results for input(s): LIPASE, AMYLASE in the last 168 hours. Recent Labs  Lab 03/17/21 1743  AMMONIA <10   Coagulation Profile: Recent Labs  Lab 03/17/21 1735  INR 1.8*   Cardiac Enzymes: No results for input(s): CKTOTAL, CKMB, CKMBINDEX, TROPONINI in the last 168 hours. BNP (last 3 results) No results for input(s): PROBNP in the last 8760 hours. HbA1C: No results for input(s): HGBA1C in the last 72 hours. CBG: No results for input(s): GLUCAP in the last 168 hours. Lipid Profile: No results for input(s): CHOL, HDL, LDLCALC, TRIG, CHOLHDL, LDLDIRECT in the last 72 hours. Thyroid Function Tests: No results for input(s): TSH, T4TOTAL, FREET4, T3FREE, THYROIDAB in the last 72 hours. Anemia Panel: No results for input(s): VITAMINB12, FOLATE, FERRITIN, TIBC, IRON, RETICCTPCT in the last 72 hours. Urine analysis:    Component Value Date/Time   COLORURINE AMBER (A) 03/17/2021 1656   APPEARANCEUR TURBID (A) 03/17/2021 1656   LABSPEC 1.012 03/17/2021 1656   LABSPEC 1.030 04/11/2007 1446   PHURINE 5.0 03/17/2021 1656   GLUCOSEU NEGATIVE 03/17/2021 1656   HGBUR MODERATE (A) 03/17/2021 1656   BILIRUBINUR NEGATIVE 03/17/2021 1656   BILIRUBINUR Negative 04/11/2007 1446   KETONESUR NEGATIVE 03/17/2021 1656   PROTEINUR 30 (A) 03/17/2021 1656   UROBILINOGEN 0.2 06/10/2014 1303    NITRITE NEGATIVE 03/17/2021 1656   LEUKOCYTESUR LARGE (A) 03/17/2021 1656   LEUKOCYTESUR Negative 04/11/2007 1446    Radiological Exams on Admission: DG Chest Port 1 View  Result Date: 03/17/2021 CLINICAL DATA:  Questionable sepsis.  Tachypnea and tachycardia. EXAM: PORTABLE CHEST 1 VIEW COMPARISON:  Radiograph 10/09/2020 FINDINGS: Biapical pleuroparenchymal scarring.The cardiomediastinal contours are normal. The lungs are clear. Pulmonary vasculature is normal. No consolidation, pleural effusion, or pneumothorax. No acute osseous abnormalities are seen. IMPRESSION: Biapical pleuroparenchymal scarring. No acute findings. Electronically Signed   By: Keith Rake M.D.   On: 03/17/2021 18:26      Assessment/Plan   Principal Problem:   Acute metabolic encephalopathy Active Problems:   Human immunodeficiency virus (HIV) disease (HCC)   Protein-calorie malnutrition, severe (HCC)   Acute renal failure superimposed on stage 3a chronic kidney disease (HCC)   Severe sepsis (HCC)   Malnutrition of moderate degree   Chronic diastolic CHF (congestive heart failure) (HCC)   High anion gap metabolic acidosis   Hypercalcemia      #) Acute metabolic encephalopathy: Reported 2 to 3 days of confusion relative to baseline mental status: Which appears to be multifactorial in nature, including suspected contribution from physiologic stressor in setting of severe sepsis of unclear infectious source, and additional 2 clinical evidence to suggest dehydration as well as additional metabolic impacts of  acute kidney injury superimposed on stage IIIa CKD as well as hypercalcemia and anion gap metabolic acidosis, as further detailed below.  Potential sources of underlying factor include left lateral foot wound as well as sacral decubitus ulcer, while noting the immunocompromised nature of this patient in the setting of his HIV/AIDS, with most recent CD4 count of 167.  Of note, ammonia level found to be less  than 10, while VBG reflects no evidence of hypercapnia.    Plan: fall precautions. Repeat CMP/CBC in the AM. Check magnesium level. check  TSH, MMA, CPK , UDS, ionized calcium level.  Further evaluation and management of presenting severe sepsis, including continuation of IV fluids with repeat lactic acid ordered.  Further evaluation and management of hypercalcemia as well as AKI on CKD 3A, as further detailed below.       #) Severe sepsis: In the setting of the patient's presenting 2 to 3 days of confusion, SIRS criteria met via progressive leukocytosis, tachypnea, tachycardia. Lactic acid level: Initially noted to be 4.4, previously trending down to 3.2 following interval initiation of IV antibiotics as well as IV fluids. Of note, given the associated presence of suspected end organ damage in the form of concominant presenting elevated lactate, as well as the presence of acute metabolic encephalopathy and AKI superimposed on CKD 3A, criteria are met for pt's sepsis to be considered severe in nature.  Of note, the patient has received greater than 30 mL/kg IVF bolus in setting of most recent documented weight of 59 kg.   In terms of source of suspected underlying infection, possibilities include infected left lateral foot wound as well as infected sacral decubitus ulcer.  Chest x-ray shows no evidence of acute cardiopulmonary process, including no evidence of infiltrate.  We will check procalcitonin level to further assess.  Urinalysis shows significant pyuria, although this is in the context of chronic indwelling Foley catheter, complicating evaluation of such, while noting recent course of Rocephin via PICC line as an outpatient, as further detailed above.  COVID-19/influenza PCR negative.  No evidence of acute transaminitis.  Infectious evaluation further complicated by the patient's immunocompromise state in the setting of HIV/AIDS, CD4 count of 167 in October 2022.  May benefit from infectious  disease consult in the morning.  Additional ED work-up/management notable for: Collection of blood cultures x2 followed by initiation of IV vancomycin, cefepime, Flagyl, which will be continued for now pending further evaluation of underlying factious source and anticipated infectious disease consult.   Plan: CBC w/ diff and CMP in AM.  Follow for results of blood cx's x 2. Abx: Continue IV vancomycin, cefepime, Flagyl.  Add on procalcitonin.  Check ESR, CRP.  Plain films of left foot.  Plain films of sacrum.  Repeat lactic acid level.  Continue existing continuous LR.       #) Protein calorie malnutrition: Presenting weight documented as 59, grams associated with BMI of 16.7.  Notable in the context of HIV AIDS.  Plan: Check prealbumin level.  I have placed consult with dietitian for assistance with recommendations for nutritional optimization, including recommendations regarding potential supplements for this purpose.  Daily weights.        #) Acute kidney injury superimposed on CKD 3A: In the context of documented history of CKD 3A, with baseline creatinine range noted to be 1.4-1.7, presenting serum creatinine 2.96 relative to most recent prior value 1.47 on 01/31/2021.  Suspect contribution from intravascular patient is a consequence of dehydration as well as as a  consequence of presenting severe sepsis, as above.  Urinalysis notable for 30 protein.   Plan: Continuous LR, as above.  Monitor strict I's and O's and O's.  Avoid nephrotoxic agents.  Add on random urine sodium as well as random urine creatinine.  Check CPK, particularly in the setting of concomitant acute metabolic encephalopathy.  Repeat BMP in the morning.  Further evaluation management of severe sepsis, as above.      #) Anion gap metabolic acidosis: As reflected in presenting CMP, with VBG demonstrating no corresponding evidence of acidemia.  Likely multifactorial, with contributions from lactic acidosis, as further  detailed above, as well as contributions from AKI on CKD 3A.   Plan: Further evaluation management is for sepsis, as above.  Continuous LR, as above.  Repeat lactic acid level.  Add on CPK level.  Further evaluation management AKI on CKD 3A, as above.  Repeat CMP in the morning.        #) Hypercalcemia: Presenting labs reflect serum calcium, adjusted for hypoalbuminemia, to the 11.7, potentially rendering independent contribution to the patient's presenting suspected acute metabolic encephalopathy.  Suspect element of dehydration, without overt pharmacologic contribution. Will continue IVF's, as above, with repeat calcium level in the morning, with consideration for further expansion of work-up if no ensuing improvement in serum calcium level following interval IVF administration.     Plan: LR, as above.  Monitor strict I's&O's, daily weights.  CMP in the morning.  Ionized calcium.  Check serum Mg and Phos levels.       #) Paroxysmal atrial flutter, it appears that he carries a diagnosis of this, and is chronically anticoagulated on Eliquis. In the setting of a CHA2DS2-VASc score of 1, which does not in and of itself, provide indication for chronic thromboembolic prophylaxis.  Presenting EKG interpretation limited by the presence of significant artifact, with repeat EKG ordered, and result currently pending.  Plan: Follow-up on results of repeat EKG.  For now, continue outpatient Eliquis.  In the setting of presenting severe sepsis, will monitor on telemetry.  Add on serum magnesium level.       #) HIV/AIDS: Documented history of such, with most recent CD4 count 167 in Oct 2022, chronic antiviral therapy. He follows with [name] as his outpatient infectious disease physician, and is on chronic antiviral therapy, including via Tivicay, Darunavir-cobicistat, and Rilpivirine.    Plan: continue Tivicay, Darunavir-cobicistat, and Rilpivirine. Recheck CD4 count.          #)  Chronic diastolic heart failure: documented history of such, with most recent echocardiogram performed in May 2022 notable for LVEF 55 to 60%, no focal wall motion abnormalities, while showing evidence of grade 1 diastolic dysfunction and no evidence of valvular pathology. No clinical evidence to suggest acutely decompensated heart failure at this time. home diuretic regimen reportedly consists of the following: None.  We will closely monitor for ensuing evidence of acute volume overload in the setting of plan for provision of additional continuous IV fluids in the context of presenting severe sepsis.   Plan: monitor strict I's & O's and daily weights. Repeat BMP in AM. Check serum mag level.       #) Chronic anemia: Documented history of such, baseline hemoglobin range of 7-9, with presenting hemoglobin consistent with this range.  No evidence of active bleed at this time.   Plan: Repeat CBC in the morning.       DVT prophylaxis: SCD's + continuation of home Eliquis Code Status: DNR/DNI (including MOST  form on file) Family Communication: none Disposition Plan: Per Rounding Team Consults called: none;  Admission status: inpatient   PLEASE NOTE THAT DRAGON DICTATION SOFTWARE WAS USED IN THE CONSTRUCTION OF THIS NOTE.   [place] DO [facility]  From [place]   03/17/2021, 10:40 PM

## 2021-03-17 NOTE — Assessment & Plan Note (Addendum)
Notable in the context of HIV AIDS. Body mass index is 16.34 kg/m. Nutrition Status: Nutrition Problem: Severe Malnutrition Etiology: chronic illness (HIV/AIDS) Signs/Symptoms: severe fat depletion, severe muscle depletion Interventions: Ensure Enlive (each supplement provides 350kcal and 20 grams of protein) -- Dietitian following, encourage increase oral intake, supplementation

## 2021-03-17 NOTE — Progress Notes (Addendum)
Pharmacy Antibiotic Note ? ?[name] [name] is a 61 y.o. male admitted on 03/17/2021 with sepsis.  Pharmacy has been consulted for cefepime and vancomycin dosing. ? ?Patient with a history of HIV/AIDS, rectal CA, GBS bacteremia, and recently treated with rocephin for UTI. Patient presenting after being sent here by [name] from ID due to wound on L foot, tachypnea and tachycardia, UTI symptoms and increased confusion. ? ?SCr 2.96 - above baseline ?WBC 19.7; LA 4.4; T 97.4 F; HR 113; RR 25 ? ?Plan: ?Metronidazole per MD ?Cefepime 2g q24hr ?Vancomycin 1250 mg once, subsequent dosing as indicated per random vancomycin level until renal function stable and/or improved, at which time scheduled dosing can be considered ?Trend WBC, Fever, Renal function, & Clinical course ?F/u cultures, clinical course, WBC, fever ?De-escalate when able ?Levels at steady state ?F/u ID recommendations ? ?  ? ?Temp (24hrs), Avg:97.7 ?F (36.5 ?C), Min:97.4 ?F (36.3 ?C), Max:98 ?F (36.7 ?C) ? ?No results for input(s): WBC, CREATININE, LATICACIDVEN, VANCOTROUGH, VANCOPEAK, VANCORANDOM, GENTTROUGH, GENTPEAK, GENTRANDOM, TOBRATROUGH, TOBRAPEAK, TOBRARND, AMIKACINPEAK, AMIKACINTROU, AMIKACIN in the last 168 hours.  ?CrCl cannot be calculated (Patient's most recent lab result is older than the maximum 21 days allowed.).   ? ?Allergies  ?Allergen Reactions  ? Collagen Rash  ?  redness  ? ?Antimicrobials this admission: ?cefepime 3/9 >>  ?vancomycin 3/9 >>  ?metronidazole 3/9 >>  ? ?Microbiology results: ?Pending ? ?Thank you for allowing pharmacy to be a part of this patient?s care. ? ?Lorelei Pont, PharmD, BCPS ?03/17/2021 5:02 PM ?ED Clinical Pharmacist -  [phone] ? ? ?

## 2021-03-17 NOTE — Progress Notes (Signed)
? ?Subjective:  ? ? Patient ID: [name] [name], male  DOB: 1960-11-22, 61 y.o.        MRN: [id number] ? ? ?HPI ?61 yo M with hx of HIV/AIDS, rectal CA, adm to [facility] on 5-4 after being found down. He was found to have group B streptococcal bacteremia. 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 He initially required pressors. ?He had TEE (-). ?He also has a R THR (was previously on suppressive anbx). He had aspiration of his hip while in hospital which did not grow bacteria but did show 31,570 WBC. ?He was treated with Ceftriaxone IVPB to end on 06-28-20. ?His ART was restarted in hospital (Prezcobix/edurant/tivicay qday). He was also restarted on bactrim. ?  ?SNF is concerned that area around sacral decub is red and irritated at his prev episode. 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 No drainable soft tissue abscess at this time. ? -Feathery intramuscular edema within the piriformis muscles and hip ?adductors bilaterally, likely reactive. ?He was also found to have wound on his L foot- plain films- no osteo.  ?He had PIC 9-3 at SNF and anbx started (vanco/zosyn) on 09-12-20.  ?  ?He has a wound vac on his L foot and a dressing on his sacral wound.  ?Since starting on anbx he has had mild nausea and "gas". Has also had loose BM (watery). Has been eating well, gained was 131.7 2 days ago. (Up 2.5 #).  ?(08-14-20) CRP 20.3 ?  ?He was then adm to [facility] on 10-1 with ARF (felt to be due to vanco) as well as encephalopathy (due to baclofen and metoclopramide). His baclofen dose was reduced, his metaclopramide was stopped, his anbx were changed to augmentin and doxy, end date 10-25-20.  ?  ?At visit 10-24-20: He c/o sore throat. And was given rx for diflucan.   ?Wound care is being done at SNF, he is not able to go to [facility].  ?He has air mattress.  ?He had labs which showed Cr 2.48  CO2 15.  ?His repeat yesterday  was 1.80. CO2 17. H/H 8.8/26.4. C diff (-).  ?  ?Flu vax 10-14-20 at SNF.  ?  ?11-04-20: "I feel ok". "It's the same as last time I was here". He was started on diflucan for thrush.   ?12-09-20: "I'm getting sick a lot" emesis. Family notes he has burping (? Reflux") a lot. He had an x-ray of his stomach. Was found to have "gas and stool". (Plain film- may represent ileus).  ?He had arterial studies in his LE and was found to have severe PVD R, mild PVD L, bilateral CFV DVT. He is on eliquis.  ?Cr 1.7 (11-24-20) ?  ?02-17-21 ?Today is here with his sister.  ?He has had visits with palliative care- DNR but o/w continue current care.  ?He has been seen in ED as well for urinary retention of his catheter. This was replaced. Has been seen by urology as well. He didn't want suprapubic catheter.  ?The catheter was left in place due to concerns about his having urinary incont and this getting into his wound/decubitus.   ?He is awaiting a new surgeon to eval his wound. No change in his treatment rx, or his wound (per his sister).  ?He is doing well at [facility] having wound pain with prolonged sitting. He has gotten lidocaine  patches and other rx (which has not processed yet).  ?Wt 131.5 last at [facility].  ? ?03-17-21 ?He is here today stating he got life flighted back from [place] due to a job change.  ?His sister states he has had lots of delusions. He was treated with Ceftriaxone for a presumed UTI. He also got another "UTI" and was given IVF.  ?Has had worsening confusion.  ?Has had blood in stool per his sisters report but they are unsure how much. He was to be sent to GI but this is pending.  ?Orientation- person/DOB+, place (-), date (Thursday, Feb, 2023). ?Has left rehab center and is now in full time nursing. Sister feels level of care is less. PT has stopped. Needs Hoyer lift to transfer. Was previously standing.  ?He states he had emesis 3-4 days ago.  ?Had abd films 2 weeks ago and was fond  to have ileus. Was on liquid diet briefly.  ?Denies dysphagia.  ? ?Per daughter, per nurse wound on foot has healed and wound on his back is "tiniest bit better". ? ?DTGV/RPV/DRVc ? ?HIV 1 RNA Quant  ?Date Value  ?10/10/2020 120 copies/mL  ?07/20/2020 73 Copies/mL (H)  ?06/17/2020 152 copies/mL (H)  ? ?CD4 T Cell Abs (/uL)  ?Date Value  ?10/10/2020 167 (L)  ?07/20/2020 334 (L)  ?05/13/2020 <35 (L)  ? ? ? ?Health Maintenance  ?Topic Date Due  ? TETANUS/TDAP  Never done  ? Zoster Vaccines- Shingrix (1 of 2) Never done  ? COLONOSCOPY (Pts 45-47yr Insurance coverage will need to be confirmed)  Never done  ? INFLUENZA VACCINE  08/09/2020  ? COVID-19 Vaccine  Completed  ? Hepatitis C Screening  Completed  ? HIV Screening  Completed  ? HPV VACCINES  Aged Out  ? ? ? ? ?Review of Systems  ?Reason unable to perform ROS: confusion.  ? ?Please see HPI. All other systems reviewed and negative. ? ?   ?Objective:  ?Physical Exam ?Vitals reviewed.  ?Constitutional:   ?   Appearance: He is ill-appearing. He is not diaphoretic.  ?HENT:  ?   Mouth/Throat:  ?   Mouth: Mucous membranes are dry.  ?   Pharynx: No oropharyngeal exudate.  ?Eyes:  ?   Pupils: Pupils are equal, round, and reactive to light.  ?Cardiovascular:  ?   Rate and Rhythm: Tachycardia present.  ?Pulmonary:  ?   Effort: Tachypnea present.  ?Abdominal:  ?   General: Bowel sounds are normal. There is no distension.  ?   Palpations: Abdomen is soft.  ?   Tenderness: There is no abdominal tenderness.  ?Musculoskeletal:     ?   General: Swelling present.  ?   Cervical back: Normal range of motion and neck supple.  ?   Right lower leg: Edema present.  ?   Left lower leg: Edema present.  ?Feet:  ?   Right foot:  ?   Skin integrity: Skin breakdown, callus and fissure present.  ?   Toenail Condition: Right toenails are abnormally thick.  ?   Left foot:  ?   Skin integrity: Skin breakdown, callus and fissure present.  ?   Toenail Condition: Left toenails are abnormally thick.  ?    Comments: Please see photos.  ?Wound on L foot is seeping clear fluid. Both feet feel cool, erythematous.  ?Neurological:  ?   Mental Status: He is alert.  ? ? ? ? ?   ?Assessment & Plan:  ? ?

## 2021-03-17 NOTE — Assessment & Plan Note (Addendum)
I am concerned about Mr [name] [name]- he is tachypneic, tachycardic and has a significant wound on his L foot. He also has a sacral wound which I cannot visualize. He also has an indwelling foley.  ?In addition he is confused.  ?I spoke with him and his sister and recommended they go to the hospital for evaluation.  ?

## 2021-03-17 NOTE — Assessment & Plan Note (Addendum)
Presented with high anion gap metabolic acidosis.   Likely multifactorial,  contribution from lactic acidosis, AKI on CKD stage IIIa ?Improved with IV hydration.  ? ?  ?  ?  ?

## 2021-03-17 NOTE — ED Provider Triage Note (Signed)
Emergency Medicine Provider Triage Evaluation Note ? ?[name] [name] , a 61 y.o. male  was evaluated in triage.  Pt complains of confusion.  Here with sister.  Patient sent by infectious disease due to concern for sepsis.  Patient is AIDS patient on ART.  Patient complains of wound to left foot, recent UTI.  Sister says that he was treated with antibiotics for UTI.  States they have been working to improve the wound on his left foot.  She states he also has a stage IV sacral ulcer.  Patient is tachypneic, tachycardic.  Patient very pale ?Review of Systems  ?Positive: See above ?Negative:  ? ?Physical Exam  ?BP 109/78 (BP Location: Right Arm)   Pulse (!) 123   Temp 98 ?F (36.7 ?C) (Axillary)   Resp 20   SpO2 100%  ?Gen:   Awake, ill-appearing, cachectic ?Resp:  Tachypneic ?MSK:   Moves extremities without difficulty  ?Other:  Septic appearing.  Pale.  Abdomen is flat, nontender.  Weeping wound to the lateral left foot.  Deferred looking at sacral ulcer in triage.  Irregularly irregular rhythm on auscultation with murmur. ? ?Medical Decision Making  ?Medically screening exam initiated at 4:53 PM.  Appropriate orders placed.  [name] [name] was informed that the remainder of the evaluation will be completed by another provider, this initial triage assessment does not replace that evaluation, and the importance of remaining in the ED until their evaluation is complete. ? ? ?  ?[name] Hillier, PA-C ?03/17/21 1655 ? ?

## 2021-03-18 ENCOUNTER — Inpatient Hospital Stay (HOSPITAL_COMMUNITY): Payer: Medicare Other

## 2021-03-18 DIAGNOSIS — G9341 Metabolic encephalopathy: Secondary | ICD-10-CM | POA: Diagnosis not present

## 2021-03-18 LAB — COMPREHENSIVE METABOLIC PANEL
ALT: 14 U/L (ref 0–44)
AST: 15 U/L (ref 15–41)
Albumin: 2 g/dL — ABNORMAL LOW (ref 3.5–5.0)
Alkaline Phosphatase: 55 U/L (ref 38–126)
Anion gap: 12 (ref 5–15)
BUN: 46 mg/dL — ABNORMAL HIGH (ref 6–20)
CO2: 16 mmol/L — ABNORMAL LOW (ref 22–32)
Calcium: 9.3 mg/dL (ref 8.9–10.3)
Chloride: 110 mmol/L (ref 98–111)
Creatinine, Ser: 2.41 mg/dL — ABNORMAL HIGH (ref 0.61–1.24)
GFR, Estimated: 30 mL/min — ABNORMAL LOW (ref >60)
Glucose, Bld: 85 mg/dL (ref 70–99)
Potassium: 3.2 mmol/L — ABNORMAL LOW (ref 3.5–5.1)
Sodium: 138 mmol/L (ref 135–145)
Total Bilirubin: 0.4 mg/dL (ref 0.3–1.2)
Total Protein: 6.8 g/dL (ref 6.5–8.1)

## 2021-03-18 LAB — CBC WITH DIFFERENTIAL/PLATELET
Abs Immature Granulocytes: 0.11 10*3/uL — ABNORMAL HIGH (ref 0.00–0.07)
Basophils Absolute: 0.1 10*3/uL (ref 0.0–0.1)
Basophils Relative: 1 %
Eosinophils Absolute: 0.2 10*3/uL (ref 0.0–0.5)
Eosinophils Relative: 2 %
HCT: 20.3 % — ABNORMAL LOW (ref 39.0–52.0)
Hemoglobin: 5.9 g/dL — CL (ref 13.0–17.0)
Immature Granulocytes: 1 %
Lymphocytes Relative: 24 %
Lymphs Abs: 2.3 10*3/uL (ref 0.7–4.0)
MCH: 27.4 pg (ref 26.0–34.0)
MCHC: 29.1 g/dL — ABNORMAL LOW (ref 30.0–36.0)
MCV: 94.4 fL (ref 80.0–100.0)
Monocytes Absolute: 0.6 10*3/uL (ref 0.1–1.0)
Monocytes Relative: 6 %
Neutro Abs: 6.4 10*3/uL (ref 1.7–7.7)
Neutrophils Relative %: 66 %
Platelets: 338 10*3/uL (ref 150–400)
RBC: 2.15 MIL/uL — ABNORMAL LOW (ref 4.22–5.81)
RDW: 17.1 % — ABNORMAL HIGH (ref 11.5–15.5)
WBC: 9.7 10*3/uL (ref 4.0–10.5)
nRBC: 0 % (ref 0.0–0.2)

## 2021-03-18 LAB — PHOSPHORUS: Phosphorus: 3.9 mg/dL (ref 2.5–4.6)

## 2021-03-18 LAB — LACTIC ACID, PLASMA: Lactic Acid, Venous: 1 mmol/L (ref 0.5–1.9)

## 2021-03-18 LAB — TSH: TSH: 4.239 u[IU]/mL (ref 0.350–4.500)

## 2021-03-18 LAB — HEMOGLOBIN AND HEMATOCRIT, BLOOD
HCT: 19.4 % — ABNORMAL LOW (ref 39.0–52.0)
HCT: 23.8 % — ABNORMAL LOW (ref 39.0–52.0)
Hemoglobin: 5.8 g/dL — CL (ref 13.0–17.0)
Hemoglobin: 7.7 g/dL — ABNORMAL LOW (ref 13.0–17.0)

## 2021-03-18 LAB — RAPID URINE DRUG SCREEN, HOSP PERFORMED
Amphetamines: NOT DETECTED
Barbiturates: NOT DETECTED
Benzodiazepines: NOT DETECTED
Cocaine: NOT DETECTED
Opiates: NOT DETECTED
Tetrahydrocannabinol: NOT DETECTED

## 2021-03-18 LAB — PREPARE RBC (CROSSMATCH)

## 2021-03-18 LAB — PROCALCITONIN: Procalcitonin: 0.29 ng/mL

## 2021-03-18 LAB — SEDIMENTATION RATE: Sed Rate: >140 mm/hr — ABNORMAL HIGH (ref 0–16)

## 2021-03-18 LAB — CREATININE, URINE, RANDOM: Creatinine, Urine: 78.74 mg/dL

## 2021-03-18 LAB — MAGNESIUM
Magnesium: 1.5 mg/dL — ABNORMAL LOW (ref 1.7–2.4)
Magnesium: 1.6 mg/dL — ABNORMAL LOW (ref 1.7–2.4)

## 2021-03-18 LAB — PREALBUMIN: Prealbumin: 9 mg/dL — ABNORMAL LOW (ref 18–38)

## 2021-03-18 LAB — SODIUM, URINE, RANDOM: Sodium, Ur: 34 mmol/L

## 2021-03-18 LAB — CK: Total CK: 27 U/L — ABNORMAL LOW (ref 49–397)

## 2021-03-18 LAB — C-REACTIVE PROTEIN: CRP: 8.9 mg/dL — ABNORMAL HIGH (ref <1.0)

## 2021-03-18 LAB — T-HELPER CELLS (CD4) COUNT (NOT AT ARMC)
CD4 % Helper T Cell: 21 % — ABNORMAL LOW (ref 33–65)
CD4 T Cell Abs: 487 /uL (ref 400–1790)

## 2021-03-18 MED ORDER — POTASSIUM CHLORIDE 20 MEQ PO PACK: 40.0000 meq | PACK | Freq: Once | ORAL | Status: DC

## 2021-03-18 MED ORDER — POTASSIUM CHLORIDE CRYS ER 20 MEQ PO TBCR
40.0000 meq | EXTENDED_RELEASE_TABLET | Freq: Once | ORAL | Status: AC
  Administered 2021-03-18: 40 meq via ORAL
  Filled 2021-03-18: qty 2

## 2021-03-18 MED ORDER — SODIUM CHLORIDE 0.9 % IV SOLN
8.0000 mg/kg | Freq: Once | INTRAVENOUS | Status: AC
  Administered 2021-03-18: 450 mg via INTRAVENOUS
  Filled 2021-03-18: qty 9

## 2021-03-18 MED ORDER — SODIUM CHLORIDE 0.9% IV SOLUTION: Freq: Once | INTRAVENOUS | Status: AC

## 2021-03-18 MED ORDER — MAGNESIUM SULFATE 2 GM/50ML IV SOLN
2.0000 g | Freq: Once | INTRAVENOUS | Status: DC
Start: 2021-03-18 — End: 2021-03-18

## 2021-03-18 MED ORDER — CHLORHEXIDINE GLUCONATE CLOTH 2 % EX PADS
6.0000 | MEDICATED_PAD | Freq: Every day | CUTANEOUS | Status: DC
  Administered 2021-03-18 – 2021-03-23 (×6): 6 via TOPICAL

## 2021-03-18 MED ORDER — MAGNESIUM SULFATE 2 GM/50ML IV SOLN
2.0000 g | Freq: Once | INTRAVENOUS | Status: AC
  Administered 2021-03-18: 2 g via INTRAVENOUS
  Filled 2021-03-18: qty 50

## 2021-03-18 MED ORDER — ENSURE ENLIVE PO LIQD
237.0000 mL | Freq: Three times a day (TID) | ORAL | Status: DC
  Administered 2021-03-18 – 2021-03-23 (×10): 237 mL via ORAL
  Filled 2021-03-18: qty 237

## 2021-03-18 MED ORDER — DAPTOMYCIN 500 MG IV SOLR
8.0000 mg/kg | INTRAVENOUS | Status: DC
  Filled 2021-03-18: qty 9

## 2021-03-18 NOTE — Assessment & Plan Note (Addendum)
This could be secondary to HIV, underlying malignancy. Baseline Hb 7-9.   Hemoglobin 5.9 on arrival. FOBT negative. Transfused 2 unit PRBC on 03/18/2021 --Hgb  8.3>>5.9>5.8>7.7>8.2>8.0 --CBC q3d

## 2021-03-18 NOTE — Progress Notes (Addendum)
Pharmacy Antibiotic Note ? ?[name] [name] is a 61 y.o. male admitted on 03/17/2021 with sepsis.  Pharmacy has been consulted for cefepime and vancomycin dosing. ? ?Patient with a history of HIV/AIDS, rectal CA, GBS bacteremia, and recently treated with rocephin for UTI. Patient presenting after being sent here by [name] from ID due to wound on L foot, tachypnea and tachycardia, UTI symptoms and increased confusion. ? ?ID has been consulted for sepsis/possible osteo management. He has a hx of CKD. Vanc has been changed to dapto.  ? ?CrCl<30 ml/min ?CK 27 3/10 ? ?Plan: ?Dc vanc ?Dapto '8mg'$ /kg ('450mg'$ ) IV q48 ?Weekly CK  ? ?Height: '6\' 2"'$  (188 cm) ?Weight: 57.7 kg (127 lb 4.8 oz) ?IBW/kg (Calculated) : 82.2 ? ?Temp (24hrs), Avg:98.3 ?F (36.8 ?C), Min:97.7 ?F (36.5 ?C), Max:98.8 ?F (37.1 ?C) ? ?Recent Labs  ?Lab 03/17/21 ?1714 03/17/21 ?1735 03/17/21 ?1856 03/18/21 ?0253 03/18/21 ?0350  ?WBC  --  19.7*  --  9.7  --   ?CREATININE  --  2.96*  --  2.41*  --   ?LATICACIDVEN 4.4*  --  3.2*  --  1.0  ? ?  ?Estimated Creatinine Clearance: 26.9 mL/min (A) (by C-G formula based on SCr of 2.41 mg/dL (H)).   ? ?Allergies  ?Allergen Reactions  ? Collagen Rash  ?  redness  ? ?Antimicrobials this admission: ?cefepime 3/9 >>  ?vancomycin 3/9 >> 3/10 ?metronidazole 3/9 >>  ?Dapto 3/10>> ? ?Microbiology results: ?3/9 blood: ?3/9 urine: ?3/9 COVID and flu: neg ? ?Onnie Boer, PharmD, BCIDP, AAHIVP, CPP ?Infectious Disease Pharmacist ?03/18/2021 7:42 PM ? ? ?Addendum ? ?Dc cefepime and flagyl per Dr. [place]. ? ?Onnie Boer, PharmD, BCIDP, AAHIVP, CPP ?Infectious Disease Pharmacist ?03/18/2021 8:58 PM ? ? ? ?

## 2021-03-18 NOTE — Progress Notes (Signed)
HOSPITAL MEDICINE OVERNIGHT EVENT NOTE   ? ?Notified by nursing that patient's hemoglobin this morning is 5.9. ? ?According to nursing there is no clinical evidence of bleeding.  Patient denies any shortness of breath or chest pain.  Bilirubin this morning is 0.4. ? ?Per further review of labs patient's hemoglobin of 5.9 is a dramatic drop from 9.9 yesterday.  Because of this, we will obtain a repeat hemoglobin and hematocrit to confirm that this is legitimate.  In the meantime we will additionally obtain a type and screen. ? ?Upon further review of labs I have identified that patient's potassium is currently 3.3 for which we will give 40 mill equivalents of oral potassium chloride.  Patient's magnesium is also 1.6 for which we will give 2 g of intravenous magnesium sulfate. ? ?[name] Emerald  MD ?[facility]  ? ? ? ? ? ? ? ? ? ? ?

## 2021-03-18 NOTE — Progress Notes (Signed)
?Progress Note ? ? ?Patient: [name] [name] JTT:[id number] DOB: May 22, 1960 DOA: 03/17/2021     1 ? ?DOS: the patient was seen and examined on 03/18/2021 ?  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 ? ?Assessment and Plan: ?* Acute metabolic encephalopathy ?He was reported to have 2 to 3 days of confusion relative to his baseline mental status.   ?Likely multifactorial in the setting of sepsis, dehydration, AKI on CKD stage IIIa, hypercalcemia and anion gap metabolic acidosis.  Dehydration,  Hypercalcemia .  Metabolic acidosis resolved.  Ammonia level normal. ?Patient is back to his baseline mental status. ?  ? ?Severe sepsis ([place]) ?He presented with 2 to 3 days of confusion, leukocytosis, tachycardia, tachypnea, lactic acid initially 4.4 which resolved with IV hydration.  Acute metabolic encephalopathy which now resolved. ?Chest X-ray no evidence of acute cardiopulmonary process.   ?Procalcitonin 0.29.  COVID-negative, influenza negative. ?UA significant pyuria in the context of chronic indwelling Foley catheter. ?In terms of source of suspected underlying infection, possibilities include infected left lateral foot wound as well as infected sacral decubitus ulcer.  ?Continue empiric antibiotics (Vanco, cefepime, Flagyl), follow blood cultures. ?Wound care consulted. ?  ? ?Hypercalcemia ?Resolved. ? ?High anion gap metabolic acidosis ?Presented with high anion gap metabolic acidosis.   ?Likely multifactorial,  contribution from lactic acidosis, AKI on CKD  stage IIIa ?Improved with IV hydration.  ? ?  ?  ?  ? ?Normochromic normocytic anemia ?This could be secondary to HIV, underlying malignancy. ?Baseline Hb 7-9.   Hemoglobin 5.9 on arrival ?Stool for occult blood negative, transfused 2 unit PRBC. ?F/u Post transfusion CBC ?Continue to monitor H&H. ? ?Chronic diastolic CHF (congestive heart failure) ([place]) ?Does not appear volume overloaded. ?Recent echocardiogram LVEF 55 to 60% no RWMA ? ? ?  ? ?Acute renal failure superimposed on stage 3a chronic kidney disease ([place]) ?Baseline serum creatinine 1.4-1.7.  Creatinine on admission 2.96. ?Likely prerenal in the setting of sepsis.  Continue IV hydration. ?Avoid nephrotoxic medications.  Continue to monitor serum creatinine ? ? ?Protein-calorie malnutrition, severe ([place]) ?Notable in the context of HIV AIDS. ?Dietician consult.  ? ? ?Human immunodeficiency virus (HIV) disease ([place]) ?Most recent CD4 count 167 in Oct 2022,  ?He follows with [name] as his outpatient infectious disease physician, and is on chronic antiviral therapy, including via Tivicay, Darunavir-cobicistat, and Rilpivirine.  ?Continue Tivicay, Darunavir-cobicistat, and Rilpivirine.  ?Recheck CD4 count.  ?  ? ? ?Pressure Injury 05/13/20 Sacrum Mid Stage 3 -  Full thickness tissue loss. Subcutaneous fat may be visible but bone, tendon or muscle are NOT exposed. Was [place] PI, now healing Stage 3 because was previously Stage 3 (Active)  ?05/13/20   ?Location: Sacrum  ?Location Orientation: Mid  ?Staging: Stage 3 -  Full thickness tissue loss. Subcutaneous fat may be visible but bone, tendon or muscle are NOT exposed.  ?Wound Description (Comments): Was [place] PI, now healing Stage 3 because was previously Stage 3  ?Present on Admission: Yes  ?   ?Pressure Injury 10/09/20 Heel Right Stage 2 -  Partial thickness loss of dermis presenting as a shallow open injury with  a red, pink wound bed without slough. (Active)  ?10/09/20 2300  ?Location: Heel  ?Location  Orientation: Right  ?Staging: Stage 2 -  Partial thickness loss of dermis presenting as a shallow open injury with a red, pink wound bed without slough.  ?Wound Description (Comments):   ?Present on Admission: Yes  ?   ?Pressure Injury 12/17/20 Sacrum Mid Stage 4 - Full thickness tissue loss with exposed bone, tendon or muscle. (Active)  ?12/17/20   ?Location: Sacrum  ?Location Orientation: Mid  ?Staging: Stage 4 - Full thickness tissue loss with exposed bone, tendon or muscle.  ?Wound Description (Comments):   ?Present on Admission: Yes  ? ? ?Subjective: Patient was seen and examined at bedside.  Overnight events noted. ?Patient reports feeling better, back to his baseline mental status. ?There is no any visible bleeding noted, appears pale but comfortable. ? ?Physical Exam: ?Vitals:  ? 03/18/21 1043 03/18/21 1122 03/18/21 1344 03/18/21 1416  ?BP: 123/81 129/79 (!) 127/92 118/75  ?Pulse: 92 89 96 93  ?Resp: '14 14 14 14  '$ ?Temp: 97.7 ?F (36.5 ?C) 98.1 ?F (36.7 ?C) 98 ?F (36.7 ?C) 97.8 ?F (36.6 ?C)  ?TempSrc: Oral Oral Oral Oral  ?SpO2: 100% 99% 100% 100%  ?Weight:      ?Height:      ? ?General exam: Appears comfortable, not in any acute distress. ?Respiratory : CTA bilaterally, no wheezing, no crackles.  Normal respiratory effort. ?Cardiovascular : S1-S2 heard, regular rate and rhythm, no murmur. ?Gastrointestinal system: Abdomen is soft, non tender, non distended, BS+ ?Central nervous system: Alert, oriented x 3, quadriplegic. ?Extremities: No edema, no cyanosis, no clubbing, Left foot wound. ?Psychiatry: Mood, insight, judgment appropriate ? ?Data Reviewed: ?I have Reviewed nursing notes, Vitals, and Lab results since pt's last encounter. Pertinent lab results CBC, CMP ?I have ordered test including CBC, BMP ?I have reviewed the last note from wound care,  ?I have discussed pt's care plan and test results with patient.  ? ?Family Communication: No family at bedside ? ? ?Disposition: ?Status is: Inpatient ?Remains  inpatient appropriate because: Admitted for sepsis altered mentation, and anemia requiring IV antibiotics, blood transfusion. ? ?Planned Discharge Destination: Skilled nursing facility ? ? ? ? ? ?Time spent: 50 minutes ? ?Author: ?Shawna Clamp, MD ?03/18/2021 3:17 PM ? ?For on call review [email or web address].  ?

## 2021-03-18 NOTE — Progress Notes (Signed)
Initial Nutrition Assessment ? ?DOCUMENTATION CODES:  ?Severe malnutrition in context of chronic illness, Underweight ? ?INTERVENTION:  ?-Ensure Enlive po TID, each supplement provides 350 kcal and 20 grams of protein. ? ?NUTRITION DIAGNOSIS:  ?Severe Malnutrition related to chronic illness (HIV/AIDS) as evidenced by severe fat depletion, severe muscle depletion. ? ?GOAL:  ?Patient will meet greater than or equal to 90% of their needs ? ?MONITOR:  ?PO intake, Supplement acceptance, Labs, Weight trends ? ?REASON FOR ASSESSMENT:  ?Consult ?Malnutrition Eval ? 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 ? ?Pt  reports appetite is poor and states that he typically eats 2 meals per day but has had difficulty eating since yesterday. Pt agreeable to Ensure TID.  ? ?Weight history reviewed. No significant weight changes observed though pt is still underweight based on BMI of 16.34.  ? ?PO Intake: none documented  ? ?UOP: none documented x24 hours ?I/O: +187m since admit ? ?Medications: pepcid, IV abx ?Labs: ?Recent Labs  ?Lab 03/17/21 ?1735 03/17/21 ?1755 03/18/21 ?0253  ?NA 139 142 138  ?K 3.7 3.8 3.2*  ?CL 109  --  110  ?CO2 13*  --  16*  ?BUN 52*  --  46*  ?CREATININE 2.96*  --  2.41*  ?CALCIUM 10.7*  --  9.3  ?MG  --   --  1.6*  1.5*  ?PHOS  --   --  3.9  ?GLUCOSE 155*  --  85  ? ?NUTRITION - FOCUSED PHYSICAL EXAM: ?Flowsheet Row Most Recent Value  ?Orbital Region Moderate depletion  ?Upper Arm Region Severe depletion  ?Thoracic and Lumbar Region Severe depletion  ?Buccal Region Severe depletion  ?Temple Region Severe depletion  ?Clavicle Bone Region Severe depletion  ?Clavicle and Acromion Bone Region  Severe depletion  ?Scapular Bone Region Severe depletion  ?Dorsal Hand Moderate depletion  ?Patellar Region Severe depletion  ?Anterior Thigh Region Severe depletion  ?Posterior Calf Region Severe depletion  ?Edema (RD Assessment) None  ?Hair Reviewed  ?Eyes Reviewed  ?Mouth Reviewed  ?Skin Reviewed  ?Nails Reviewed  ? ?Diet Order:   ?Diet Order   ? ?       ?  Diet regular Room service appropriate? Yes; Fluid consistency: Thin  Diet effective now       ?  ? ?  ?  ? ?  ? ?EDUCATION NEEDS:  ?No education needs have been identified at this time ? ?Skin:  Skin Assessment: Reviewed RN Assessment (wound/incision to L toe) ? ?Last BM:  PTA ? ?Height:  ?Ht Readings from Last 1 Encounters:  ?03/18/21 '6\' 2"'$  (1.88 m)  ? ?Weight:  ?Wt Readings from Last 1 Encounters:  ?03/18/21 57.7 kg  ? ?BMI:  Body mass index is 16.34 kg/m?. ? ?Estimated Nutritional Needs:  ?Kcal:  1800-2000 ?Protein:  90-100 grams ?Fluid:  >1.8L/d ? ? ? ?ATheone Stanley, MS, RD, LDN (she/her/hers) ?RD pager number and weekend/on-call pager number located in [facility] ?

## 2021-03-18 NOTE — Hospital Course (Addendum)
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 ?

## 2021-03-18 NOTE — TOC Initial Note (Signed)
Transition of Care (TOC) - Initial/Assessment Note  ? ? ?Patient Details  ?Name: [name] [name] ?MRN: [id number] ?Date of Birth: 01/17/1960 ? ?Transition of Care [facility]) CM/SW Contact:    ?Geralynn Ochs, LCSW ?Phone Number: ?03/18/2021, 10:54 AM ? ?Clinical Narrative:         Patient is long term care resident at [facility], can return when medically stable. CSW to follow.         ? ? ?Expected Discharge Plan: [place] ?Barriers to Discharge: Continued Medical Work up ? ? ?Patient Goals and CMS Choice ?Patient states their goals for this hospitalization and ongoing recovery are:: feel better ?CMS Medicare.gov Compare Post Acute Care list provided to:: Patient ?Choice offered to / list presented to : Patient ? ?Expected Discharge Plan and Services ?Expected Discharge Plan: [place] ?  ?  ?Post Acute Care Choice: [place] ?Living arrangements for the past 2 months: [place] ?                ?  ?  ?  ?  ?  ?  ?  ?  ?  ?  ? ?Prior Living Arrangements/Services ?Living arrangements for the past 2 months: [place] ?Lives with:: Facility Resident ?Patient language and need for interpreter reviewed:: No ?Do you feel safe going back to the place where you live?: Yes      ?Need for Family Participation in Patient Care: No (Comment) ?Care giver support system in place?: Yes (comment) ?  ?Criminal Activity/Legal Involvement Pertinent to Current Situation/Hospitalization: No - Comment as needed ? ?Activities of Daily Living ?Home Assistive Devices/Equipment: Harrel Lemon Lift ?ADL Screening (condition at time of admission) ?Patient's cognitive ability adequate to safely complete daily activities?: Yes ?Is the patient deaf or have difficulty hearing?: No ?Does the patient have difficulty seeing, even when wearing glasses/contacts?: No ?Does the patient have difficulty concentrating, remembering, or making decisions?: No ?Patient able to express  need for assistance with ADLs?: Yes ?Does the patient have difficulty dressing or bathing?: Yes ?Independently performs ADLs?: No ?Communication: Independent ?Dressing (OT): Dependent ?Is this a change from baseline?: Pre-admission baseline ?Grooming: Dependent ?Is this a change from baseline?: Pre-admission baseline ?Feeding: Dependent ?Is this a change from baseline?: Pre-admission baseline ?Bathing: Dependent ?Is this a change from baseline?: Pre-admission baseline ?Toileting: Dependent ?Is this a change from baseline?: Pre-admission baseline ?In/Out Bed: Dependent ?Is this a change from baseline?: Pre-admission baseline ?Walks in Home: Dependent ?Is this a change from baseline?: Pre-admission baseline ?Does the patient have difficulty walking or climbing stairs?: Yes ?Weakness of Legs: Both ?Weakness of Arms/Hands: Both ? ?Permission Sought/Granted ?Permission sought to share information with : Facility Sport and exercise psychologist, Family Supports ?Permission granted to share information with : Yes, Verbal Permission Granted ? Share Information with NAME: Vinnie Level ? Permission granted to share info w AGENCY: SNF ? Permission granted to share info w Relationship: Sisters ?   ? ?Emotional Assessment ?Appearance:: Appears stated age ?Attitude/Demeanor/Rapport: Engaged ?Affect (typically observed): Appropriate ?Orientation: : Oriented to Self, Oriented to Place, Oriented to  Time, Oriented to Situation ?Alcohol / Substance Use: Not Applicable ?Psych Involvement: No (comment) ? ?Admission diagnosis:  Severe sepsis ([place]) [A41.9, R65.20] ?Acute metabolic encephalopathy [P10.25] ?Patient Active Problem List  ? Diagnosis Date Noted  ? High anion gap metabolic acidosis 85/27/7824  ? Hypercalcemia 03/17/2021  ? Thrombocytopenia ([place]) 12/21/2020  ? Bleeding from wound 12/19/2020  ? ABLA (acute blood loss anemia) 12/19/2020  ?  Acute urinary retention 12/19/2020  ? FTT (failure to thrive) in adult 12/19/2020  ? GIB  (gastrointestinal bleeding) 12/16/2020  ? Palliative care encounter 12/09/2020  ? Ileus ([place]) 12/09/2020  ? Encephalopathy   ? Pressure injury of skin 10/10/2020  ? Acute encephalopathy 10/10/2020  ? Acute metabolic encephalopathy 93/57/0177  ? 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 Chronic diastolic CHF (congestive heart failure) ([place]) 10/09/2020  ? Anemia 10/09/2020  ? Rigidity 10/09/2020  ? Hypernatremia 10/09/2020  ? Functional quadriplegia ([place]) 10/09/2020  ? Onychomycosis 08/19/2020  ? Penile ulcer 08/19/2020  ? AKI (acute kidney injury) ([place]) 05/13/2020  ? Lactic acidosis 05/13/2020  ? SDH (subdural hematoma) 05/13/2020  ? Hypothermia due to exposure 05/13/2020  ? Severe sepsis ([place]) 05/13/2020  ? Cellulitis 05/13/2020  ? Elevated brain natriuretic peptide (BNP) level 05/13/2020  ? Volume overload 05/13/2020  ? Dehydration 05/13/2020  ? Decubitus ulcer 05/13/2020  ? Subdural empyema   ? Streptococcal bacteremia   ? Rectal bleeding 02/20/2020  ? Abnormal digital rectal exam 02/20/2020  ? History of anal cancer 02/20/2020  ? Poor dentition 01/16/2017  ? Acute renal failure superimposed on stage 3a chronic kidney disease ([place]) 06/10/2014  ? Septic arthritis of hip ([place]) 07/31/2013  ? 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 Protein-calorie malnutrition, severe ([place]) 07/31/2013  ? Pain in limb 07/23/2013  ? Inguinal adenopathy 02/18/2013  ? Enlargement of lymph nodes 01/13/2013  ? 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 ANEMIA, CHRONIC 03/20/2007  ? Unspecified severe protein-calorie malnutrition ([place]) 02/14/2007  ? Acute thromboembolism of deep veins of lower extremity ([place]) 08/22/2006  ? 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[place]) 05/14/2006  ? ABSCESS, RECTUM 04/11/2006  ? Thrush of mouth and esophagus ([place]) 02/16/2006  ? DIARRHEA 02/16/2006  ? Human immunodeficiency virus (HIV) disease ([place]) 10/19/2005  ? MOLLUSCUM CONTAGIOSUM 10/19/2005  ? GOUT 10/19/2005  ? HEPATITIS B, HX OF 10/19/2005  ? HERPES ZOSTER, HX OF  10/19/2005  ? ?PCP:  [name], MD ?Pharmacy:   ?[facility] [place] ?[place] [address] ?Phone: [phone] Fax: [phone] ? ? ? ? ?Social Determinants of Health (SDOH) Interventions ?  ? ?Readmission Risk Interventions ?Readmission Risk Prevention Plan 12/23/2020 05/28/2020  ?Transportation Screening Complete Complete  ?PCP or Specialist Appt within 3-5 Days - Complete  ?[place] or Home Care Consult - Complete  ?Social Work Consult for [place] Planning/Counseling - Complete  ?Palliative Care Screening - Complete  ?Medication Review Press photographer) Complete Complete  ?PCP or Specialist appointment within 3-5 days of discharge Complete -  ?[place] or Home Care Consult Complete -  ?SW Recovery Care/Counseling Consult Complete -  ?Palliative Care Screening Not Applicable -  ?Skilled Nursing Facility Complete -  ?Some recent data might be hidden  ? ? ? ?

## 2021-03-18 NOTE — Consult Note (Signed)
WOC Nurse Consult Note: ?Reason for Consult:Patient is known to [place] from previous admissions. 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 Left foot with neuropathic wounds that are wet with maceration,foot drop noted. These wounds may also be considered nonhealing. Palliative care is involved in the community. Fecal incontinence. ?Wound type: pressure, neuropathic ?Pressure Injury POA: Yes ?Measurement: ?Sacral: 5.5cm x 5cm x 0.4cm with minimal undermining at periphery from 3-6 o'clock, ruddy red in color, serous exudate in moderate amount ?Left foot:  7cm x 7cm area at 5th digit at plantar and lateral foot with full thickness wound and maceration. Fourth metatarsal head with black nonviable tissue measuring 1.5cm x 1cm. Macerated area is separating from dermis to reveal a ruddy red wound bed. Small to moderate amount serous exudate. ?Wound bed:As described above ?Drainage (amount, consistency, odor) As described above ?Periwound:As described above ?Dressing procedure/placement/frequency: A mattress replacement is ordered. Topical wound care guidance is with calcium alginate to the left foot changed daily with offloading, the sacral wound will require a silver hydrofiber with daily dressing changes. ? ?If desired, podiatric, orthopedic medicine or Vascular surgery could be consulted for additional input on the left foot wounds. ? ?[place] nursing team will not follow, but will remain available to this patient, the nursing and medical teams.  Please re-consult if needed. ?Thanks, ?Maudie Flakes, MSN, RN, [place], [place], CWON-AP, [place]  ?Pager# [phone]  ? ? ? ?  ?

## 2021-03-18 NOTE — Consult Note (Addendum)
[facility]                                                                                        Patient Identification: Patient Name: [name] [name] MRN: [id number] [place] Date: 03/17/2021  4:31 PM Today's Date: 03/18/2021 Reason for consult: severe sepsis  Requesting provider: Shanda Howells   Principal Problem:   Acute metabolic encephalopathy Active Problems:   Human immunodeficiency virus (HIV) disease ([place])   Protein-calorie malnutrition, severe ([place])   Acute renal failure superimposed on stage 3a chronic kidney disease ([place])   Severe sepsis (HCC)   Chronic diastolic CHF (congestive heart failure) (HCC)   Normochromic normocytic anemia   High anion gap metabolic acidosis   Hypercalcemia   Antibiotics:  Vancomycin 3/9 Cefepime 3/9-c Metronidazole 3/9-c  Lines/Hardware:  Assessment # Severe sepsis with encephalopathy- Potential infective causes could be left lateral foot ulcer/osteomyelitis or PICC VS Dehydration/GI bleed.  He is awake, oriented to person, month and year but not place. Follows commands.   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 Picture of the wound does not look acutely infected. Was treated with long term abtx in Sept - Oct 2022.  # Left Lateral Foot wound- seems to be chronic and reported to be improving by daughter on 3/9 office visit. Has some serous drainage on exam, otherwise non infected. Xray with concerning for osteomyelitis of 5th metatarsal head  # Abnormal UA - unreliable UA given chronic indwelling foley's, denies urinary symptoms   # Acute on chronic anemia - h/o intermittent rectal bleed during admit, hb down to 5.9 s/p transfusion   # HIV - Follows Dr [name], on Prezcovix/endurant/tivicay   # Functional Quadriplegia  # Streptococcus Group B bacteremia in 05/2020  Recommendations   -Low suspicion for the sacral wound or left lateral foot wound to be the cause of sepsis a this time.   -Has a PICC line in the left arm which was placed at the facility for fluid administration. He does not have any pain.tenderness/swelling currently but something to consider for sources of infection and potentially need to remove. Fu blood cultures   -H/o rectal bleed/acute drop in hb and quick response to IVF and transfusion is highly suspicous for dehydration/acute on chronic anemia. No fevers.   -Switch Vancomycin to daptomycin given AKI for now pending blood cultures fu and Ortho eval of left foot Osteomyelitis. DC cefepime and metronidazole   -Continue home ART as is  -Management of anemia per primary   Dr Linus Salmons is on call this weekend and will fu cultures  Rest of the management as per the primary team. Please call with questions or concerns.  Thank you for the consult  Rosiland Oz, MD Infectious Disease Physician [facility] [address] [place], [place] [address] Phone: [phone]   Fax: [phone]  __________________________________________________________________________________________________________ HPI and Hospital Course: 61 Y O male with PMH of  Functional qudariplegia, Chronic CHF, CKD, Chronic anemia, RT THA, TURP, Rectal ca, chronic urinary retention  due to urethral stricture  s/p indwelling foleys. HIV on ART( prezcobix/edurant/tivicay) , chronic sacral DU ( s/p PICC line and IV vanx/zosyn on 09/12/20>> doxycycline/augmentin 10/25/20) who presented to the ED on 3/9 with acute encephalopathy for 2-3 days + rectal bleed for a week.   Seen in clinic with DR [name] on 3/9  where there was concerns for delusions, treated for UTI with ceftriaxone at the SNF through PICC. Daughter mentioned wound on the foot had healed although mention of some serous drainage   and wound at the back is little better. He is being  followed by wound care OP. No new cough, chest pain or SOB  ROS: He is no oriented to place, but able to tell me the month, year, name and follow commands All systems reviewed and negative except stated above   Past Medical History:  Diagnosis Date   Arthritis    Decubitus ulcer of sacral area    DVT (deep venous thrombosis) ([place]) 2008   Left leg   Exposure to hepatitis B    H/O hypotension    History of anemia    History of gout    History of transfusion    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    HIV (human immunodeficiency virus infection) ([place])    Hx of sepsis 2015   affected rt hip / femur   Pain in limb 07/23/2013   PONV (postoperative nausea and vomiting)    Hiccups for 4 days after anesthesia   Radiation    Rectal cancer (HCC)    Squamous cell   Shingles    left shoulder, right leg   Past Surgical History:  Procedure Laterality Date   COLONOSCOPY     CONVERSION TO TOTAL HIP Right 06/15/2014   Procedure: REMOVAL OF ANTIBIOTIC  SPACER AND CONVERSION TO RIGHT TOTAL HIP  ARTHROPLASTY ;  Surgeon: Paralee Cancel, MD;  Location: [facility];  Service: Orthopedics;  Laterality: Right;   CYSTOSCOPY     FLEXIBLE SIGMOIDOSCOPY N/A 12/17/2020   Procedure: FLEXIBLE SIGMOIDOSCOPY;  Surgeon: Gatha Mayer, MD;  Location: [facility];  Service: Endoscopy;  Laterality: N/A;   HEMATOMA EVACUATION     HERNIA REPAIR     right X2   INCISION AND DRAINAGE HIP Right 07/31/2013   Procedure: IRRIGATION AND DEBRIDEMENT HIP WITH PERCUTANEOUS DRAIN PLACEMENT;  Surgeon: [name], MD;  Location: [facility];  Service: Orthopedics;  Laterality: Right;   INCISION AND DRAINAGE HIP Right 03/15/2015   Procedure: IRRIGATION AND DEBRIDEMENT RIGHT HIP WITH  HEAD BALL  AND POLY LINER EXCHANGE;  Surgeon: Paralee Cancel, MD;  Location: [facility];  Service: Orthopedics;  Laterality: Right;   INCISION AND DRAINAGE OF WOUND N/A 08/25/2013   Procedure: IRRIGATION AND DEBRIDEMENT OF SACRAL ULCER WITH PLACEMENT OF  A CELL ;  Surgeon: Theodoro Kos, DO;  Location: [facility];  Service: Plastics;  Laterality: N/A;   INCISION AND DRAINAGE OF WOUND N/A 09/01/2013   Procedure: IRRIGATION AND DEBRIDEMENT SACRAL ULCER WITH PLACEMENT OF A CELL;  Surgeon: Theodoro Kos, DO;  Location: [facility];  Service: Plastics;  Laterality: N/A;   INCISION AND DRAINAGE OF WOUND N/A 09/08/2013   Procedure: IRRIGATION AND DEBRIDEMENT OF SACRAL ULCER WITH PLACEMENT OF A CELL AND VAC;  Surgeon: Theodoro Kos, DO;  Location: [facility];  Service: Plastics;  Laterality: N/A;   INCISION AND DRAINAGE OF WOUND N/A 09/18/2013   Procedure: IRRIGATION AND DEBRIDEMENT OF SACRAL WOUND WITH PLACEMENT OF A-CELL;  Surgeon: Theodoro Kos, DO;  Location: [facility];  Service: Plastics;  Laterality: N/A;   PILONIDAL CYST DRAINAGE N/A 08/09/2013   Procedure: IRRIGATION AND DEBRIDEMENT SACRAL DECUBITUS;  Surgeon: Harl Bowie, MD;  Location: [facility];  Service: General;  Laterality: N/A;   RECTAL BIOPSY N/A 03/29/2020   Procedure: EXCISION OF PERIANAL TISSUE WITH BX AND 3 PUNCH BXS;  Surgeon: Ileana Roup, MD;  [facility];  Service: General;  Laterality: N/A;   TOTAL HIP ARTHROPLASTY Right 08/04/2013   Procedure: RESECTION HIP JOINT - PLACEMENT OF CEMENT PROSTHESIS;  Surgeon: [name], MD;  Location: [facility];  Service: Orthopedics;  Laterality: Right;   TRANSURETHRAL RESECTION OF PROSTATE  JAN 2016     Scheduled Meds:  apixaban  2.5 mg Oral BID   Chlorhexidine Gluconate Cloth  6 each Topical Daily   darunavir-cobicistat  1 tablet Oral Daily   dolutegravir  50 mg Oral Daily   famotidine  20 mg Oral Daily   feeding supplement  237 mL Oral TID BM   rilpivirine  25 mg Oral Q breakfast   vancomycin variable dose per unstable renal function (pharmacist dosing)   Does not apply See admin instructions   Continuous Infusions:  ceFEPime (MAXIPIME) IV     metronidazole 500 mg (03/18/21 1115)   PRN Meds:.acetaminophen **OR** acetaminophen,  oxyCODONE  Allergies  Allergen Reactions   Collagen Rash    redness   Social History   Socioeconomic History   Marital status: Single    Spouse name: Not on file   Number of children: 0   Years of education: BA   Highest education level: Not on file  Occupational History   Occupation: disabled    Employer: [employer]  Tobacco Use   Smoking status: Former    Packs/day: 0.30    Types: Cigarettes   Smokeless tobacco: Never  Vaping Use   Vaping Use: Never used  Substance and Sexual Activity   Alcohol use: Not Currently    Alcohol/week: 3.0 standard drinks    Types: 3 Standard drinks or equivalent per week   Drug use: Not Currently    Types: Marijuana    Comment: occ.   Sexual activity: Never    Partners: Male    Comment: declined condoms  Other Topics Concern   Not on file  Social History Narrative   Not on file   Social Determinants of Health   Financial Resource Strain: Not on file  Food Insecurity: Not on file  Transportation Needs: Not on file  Physical Activity: Not on file  Stress: Not on file  Social Connections: Not on file  Intimate Partner Violence: Not on file   Breast Cancer-relatedfamily history is not on file.  Vitals BP 124/71    Pulse 89    Temp 98.8 F (37.1 C) (Oral)    Resp 16    Ht '6\' 2"'$  (1.88 m)    Wt 57.7 kg    SpO2 100%    BMI 16.34 kg/m    Physical Exam Constitutional:  sitting up in the bed, pleasant     Comments:   Cardiovascular:     Rate and Rhythm: Normal rate and regular rhythm.     Heart sounds: systolic murmur+  Pulmonary:     Effort: Pulmonary effort is normal on room air    Comments: clear breath sounds   Abdominal:     Palpations: Abdomen is soft.     Tenderness: non tender and non  distended   Musculoskeletal:        General: No swelling and tenderness in the peripheral joints       Skin:    Comments:  as above   Neurological:     General: Functional quadriplegia   Psychiatric:        Mood and  Affect: Mood normal.    Pertinent Microbiology Results for orders placed or performed during the hospital encounter of 03/17/21  Blood Culture (routine x 2)     Status: None (Preliminary result)   Collection Time: 03/17/21  5:14 PM   Specimen: BLOOD  Result Value Ref Range Status   Specimen Description BLOOD BLOOD LEFT HAND  Final   Special Requests   Final    BOTTLES DRAWN AEROBIC ONLY Blood Culture results may not be optimal due to an inadequate volume of blood received in culture bottles   Culture   Final    NO GROWTH < 24 HOURS Performed at [facility], [place] [address], [place], [place] [address]    Report Status PENDING  Incomplete  Blood Culture (routine x 2)     Status: None (Preliminary result)   Collection Time: 03/17/21  5:30 PM   Specimen: BLOOD  Result Value Ref Range Status   Specimen Description BLOOD RIGHT PICC LINE  Final   Special Requests   Final    BOTTLES DRAWN AEROBIC AND ANAEROBIC Blood Culture adequate volume   Culture   Final    NO GROWTH < 24 HOURS Performed at [facility], [place] [address], [place], [place] [address]    Report Status PENDING  Incomplete  Resp Panel by RT-PCR (Flu A&B, Covid) Nasopharyngeal Swab     Status: None   Collection Time: 03/17/21  6:22 PM   Specimen: Nasopharyngeal Swab; Nasopharyngeal(NP) swabs in vial transport medium  Result Value Ref Range Status   SARS Coronavirus 2 by RT PCR NEGATIVE NEGATIVE Final    Comment: (NOTE) SARS-CoV-2 target nucleic acids are NOT DETECTED.  The SARS-CoV-2 RNA is generally detectable in upper respiratory specimens during the acute phase of infection. The lowest concentration of SARS-CoV-2 viral copies this assay can detect is 138 copies/mL. A negative result does not preclude SARS-Cov-2 infection and should not be used as the sole basis for treatment or other patient management decisions. A negative result may occur with  improper specimen collection/handling, submission of  specimen other than nasopharyngeal swab, presence of viral mutation(s) within the areas targeted by this assay, and inadequate number of viral copies(<138 copies/mL). A negative result must be combined with clinical observations, patient history, and epidemiological information. The expected result is Negative.  Fact Sheet for Patients:  EntrepreneurPulse.com.au  Fact Sheet for Healthcare Providers:  IncredibleEmployment.be  This test is no t yet approved or cleared by the [place] FDA and  has been authorized for detection and/or diagnosis of SARS-CoV-2 by FDA under an Emergency Use Authorization (EUA). This EUA will remain  in effect (meaning this test can be used) for the duration of the COVID-19 declaration under Section 564(b)(1) of the Act, 21 U.S.C.section 360bbb-3(b)(1), unless the authorization is terminated  or revoked sooner.       Influenza A by PCR NEGATIVE NEGATIVE Final   Influenza B by PCR NEGATIVE NEGATIVE Final    Comment: (NOTE) The Xpert Xpress SARS-CoV-2/FLU/RSV plus assay is intended as an aid in the diagnosis of influenza from Nasopharyngeal swab specimens and should not be used as a sole basis for  treatment. Nasal washings and aspirates are unacceptable for Xpert Xpress SARS-CoV-2/FLU/RSV testing.  Fact Sheet for Patients: EntrepreneurPulse.com.au  Fact Sheet for Healthcare Providers: IncredibleEmployment.be  This test is not yet approved or cleared by the [place] FDA and has been authorized for detection and/or diagnosis of SARS-CoV-2 by FDA under an Emergency Use Authorization (EUA). This EUA will remain in effect (meaning this test can be used) for the duration of the COVID-19 declaration under Section 564(b)(1) of the Act, 21 U.S.C. section 360bbb-3(b)(1), unless the authorization is terminated or revoked.  Performed at [facility], [place] [address],  [place], [place] [address]     Pertinent Lab seen by me: CBC Latest Ref Rng & Units 03/18/2021 03/18/2021 03/17/2021  WBC 4.0 - 10.5 K/uL - 9.7 -  Hemoglobin 13.0 - 17.0 g/dL 5.8(LL) 5.9(LL) 9.9(L)  Hematocrit 39.0 - 52.0 % 19.4(L) 20.3(L) 29.0(L)  Platelets 150 - 400 K/uL - 338 -   CMP Latest Ref Rng & Units 03/18/2021 03/17/2021 03/17/2021  Glucose 70 - 99 mg/dL 85 - 155(H)  BUN 6 - 20 mg/dL 46(H) - 52(H)  Creatinine 0.61 - 1.24 mg/dL 2.41(H) - 2.96(H)  Sodium 135 - 145 mmol/L 138 142 139  Potassium 3.5 - 5.1 mmol/L 3.2(L) 3.8 3.7  Chloride 98 - 111 mmol/L 110 - 109  CO2 22 - 32 mmol/L 16(L) - 13(L)  Calcium 8.9 - 10.3 mg/dL 9.3 - 10.7(H)  Total Protein 6.5 - 8.1 g/dL 6.8 - 9.1(H)  Total Bilirubin 0.3 - 1.2 mg/dL 0.4 - 0.3  Alkaline Phos 38 - 126 U/L 55 - 77  AST 15 - 41 U/L 15 - 23  ALT 0 - 44 U/L 14 - 19    Pertinent Imagings/Other Imagings Plain films and CT images have been personally visualized and interpreted; radiology reports have been reviewed. Decision making incorporated into the Impression / Recommendations.  DG Sacrum/Coccyx  Result Date: 03/18/2021 CLINICAL DATA:  [place] area of sacrum/coccyx.  Sepsis. EXAM: SACRUM AND COCCYX - 2+ VIEW COMPARISON:  Pelvis radiograph 05/20/2018 CT abdomen and pelvis 12/16/2020 FINDINGS: There is diffuse decreased bone mineralization. Mild bilateral sacroiliac joint space narrowing and subchondral sclerosis degenerative change. Total right hip arthroplasty hardware is incompletely imaged however there is no perihardware lucency to indicate hardware failure or loosening. Mild left femoroacetabular joint space narrowing and peripheral osteophytosis. There is high-grade thinning of the soft tissues posterior to the distal sacrum and proximal coccyx. This is similar to the appearance on prior 12/16/2020 CT with ulceration contacting the posterior bone and mild posterior cortical erosion concerning for osteomyelitis. No definite progression from the prior  CT. IMPRESSION: Soft tissue ulceration along the posterior distal sacrum and proximal coccyx similar to prior CT. Mild posterior cortical erosion appears similar to 12/16/2020 CT, within the limits of differences in modality. Electronically Signed   By: Yvonne Kendall M.D.   On: 03/18/2021 08:52   DG Chest Port 1 View  Result Date: 03/17/2021 CLINICAL DATA:  Questionable sepsis.  Tachypnea and tachycardia. EXAM: PORTABLE CHEST 1 VIEW COMPARISON:  Radiograph 10/09/2020 FINDINGS: Biapical pleuroparenchymal scarring.The cardiomediastinal contours are normal. The lungs are clear. Pulmonary vasculature is normal. No consolidation, pleural effusion, or pneumothorax. No acute osseous abnormalities are seen. IMPRESSION: Biapical pleuroparenchymal scarring. No acute findings. Electronically Signed   By: Keith Rake M.D.   On: 03/17/2021 18:26   DG Foot Complete Left  Result Date: 03/18/2021 CLINICAL DATA:  Soft tissue wound over the fifth digit EXAM: LEFT FOOT - COMPLETE  3+ VIEW COMPARISON:  None. FINDINGS: Generalized osteopenia. Soft tissue wound overlying the fifth MTP joint. Bone destruction of the fifth metatarsal head concerning for osteomyelitis. Relative pes cavus. No acute fracture or dislocation. No aggressive osseous lesion. Normal alignment. Soft tissue are unremarkable. No radiopaque foreign body or soft tissue emphysema. IMPRESSION: 1. Soft tissue wound overlying the fifth MTP joint. Bone destruction of the fifth metatarsal head concerning for osteomyelitis. Electronically Signed   By: Kathreen Devoid M.D.   On: 03/18/2021 08:52     I spent more than 80 minutes for this patient encounter including review of prior medical records/discussing diagnostics and treatment plan with the patient/family/coordinate care with primary/other specialits with greater than 50% of time in face to face encounter.   Electronically signed by:   Rosiland Oz, MD Infectious Disease Physician [facility] for Infectious Disease Pager: [phone]

## 2021-03-18 NOTE — ED Notes (Signed)
3W called for purple man initiation  ?

## 2021-03-19 ENCOUNTER — Inpatient Hospital Stay (HOSPITAL_COMMUNITY): Payer: Medicare Other

## 2021-03-19 DIAGNOSIS — R6520 Severe sepsis without septic shock: Secondary | ICD-10-CM | POA: Diagnosis not present

## 2021-03-19 DIAGNOSIS — A419 Sepsis, unspecified organism: Secondary | ICD-10-CM | POA: Diagnosis not present

## 2021-03-19 DIAGNOSIS — G9341 Metabolic encephalopathy: Secondary | ICD-10-CM | POA: Diagnosis not present

## 2021-03-19 DIAGNOSIS — R7881 Bacteremia: Secondary | ICD-10-CM

## 2021-03-19 LAB — BLOOD CULTURE ID PANEL (REFLEXED) - BCID2

## 2021-03-19 LAB — BPAM RBC
Blood Product Expiration Date: 202303282359
Blood Product Expiration Date: 202303282359
ISSUE DATE / TIME: 202303101100
ISSUE DATE / TIME: 202303101356
Unit Type and Rh: 7300
Unit Type and Rh: 7300

## 2021-03-19 LAB — BASIC METABOLIC PANEL
Anion gap: 10 (ref 5–15)
BUN: 45 mg/dL — ABNORMAL HIGH (ref 6–20)
CO2: 15 mmol/L — ABNORMAL LOW (ref 22–32)
Calcium: 9.2 mg/dL (ref 8.9–10.3)
Chloride: 113 mmol/L — ABNORMAL HIGH (ref 98–111)
Creatinine, Ser: 2.83 mg/dL — ABNORMAL HIGH (ref 0.61–1.24)
GFR, Estimated: 25 mL/min — ABNORMAL LOW (ref >60)
Glucose, Bld: 93 mg/dL (ref 70–99)
Potassium: 3.4 mmol/L — ABNORMAL LOW (ref 3.5–5.1)
Sodium: 138 mmol/L (ref 135–145)

## 2021-03-19 LAB — TYPE AND SCREEN
ABO/RH(D): B POS
Antibody Screen: NEGATIVE
Unit division: 0
Unit division: 0

## 2021-03-19 LAB — URINE CULTURE: Culture: 70000 — AB

## 2021-03-19 LAB — ECHOCARDIOGRAM COMPLETE
Area-P 1/2: 3.12 cm2
Height: 74 in
S' Lateral: 3.7 cm
Weight: 2036.8 oz

## 2021-03-19 LAB — MAGNESIUM: Magnesium: 1.9 mg/dL (ref 1.7–2.4)

## 2021-03-19 MED ORDER — FAMOTIDINE 10 MG PO TABS
10.0000 mg | ORAL_TABLET | Freq: Every day | ORAL | Status: DC
  Administered 2021-03-20 – 2021-03-23 (×3): 10 mg via ORAL
  Filled 2021-03-19 (×5): qty 1

## 2021-03-19 MED ORDER — POTASSIUM CHLORIDE 20 MEQ PO PACK
40.0000 meq | PACK | Freq: Once | ORAL | Status: AC
  Administered 2021-03-19: 40 meq via ORAL
  Filled 2021-03-19: qty 2

## 2021-03-19 MED ORDER — FLUCONAZOLE IN SODIUM CHLORIDE 400-0.9 MG/200ML-% IV SOLN
400.0000 mg | Freq: Once | INTRAVENOUS | Status: AC
  Administered 2021-03-19: 400 mg via INTRAVENOUS
  Filled 2021-03-19: qty 200

## 2021-03-19 MED ORDER — FLUCONAZOLE IN SODIUM CHLORIDE 200-0.9 MG/100ML-% IV SOLN
200.0000 mg | INTRAVENOUS | Status: DC
  Administered 2021-03-20 – 2021-03-21 (×2): 200 mg via INTRAVENOUS
  Filled 2021-03-19 (×2): qty 100

## 2021-03-19 NOTE — Progress Notes (Addendum)
Progress Note    [name] [name]  JQG:[id number] DOB: 02/06/1960  DOA: 03/17/2021 PCP: [name], MD      Brief Narrative:    Medical records reviewed and are as summarized below:   This 61 years old male with PMH significant for HIV/AIDS on chronic antiviral therapy, rectal cancer, sacral decubitus ulcers, chronic anemia with baseline hemoglobin 7-9, chronic urinary retention in the setting of urethral stricture with chronic indwelling Foley catheter, chronic diastolic heart failure, functional quadriplegia, CKD stage IIIA associated with baseline serum creatinine 1.4-1.7.  He was brought to the hospital by his sister because of confusion.    He was admitted to the hospital for severe sepsis.     Assessment/Plan:   Principal Problem:   Acute metabolic encephalopathy Active Problems:   Severe sepsis (HCC)   Human immunodeficiency virus (HIV) disease (HCC)   Protein-calorie malnutrition, severe ([place])   Acute renal failure superimposed on stage 3a chronic kidney disease (HCC)   Chronic diastolic CHF (congestive heart failure) (HCC)   Normochromic normocytic anemia   High anion gap metabolic acidosis   Hypercalcemia   Nutrition Problem: Severe Malnutrition Etiology: chronic illness (HIV/AIDS)  Signs/Symptoms: severe fat depletion, severe muscle depletion   Body mass index is 16.34 kg/m.  Diet Order             Diet regular Room service appropriate? Yes; Fluid consistency: Thin  Diet effective now                   Severe sepsis, candidemia in an immunocompromised patient: 1 out of 3 blood culture bottles showed Candida albicans.  Continue empiric IV antibiotics.  2D echo is pending.  Follow-up with ID  Acute metabolic encephalopathy: Improving.  AKI on CKD stage IIIa, metabolic acidosis: Improving.  Monitor BMP  Hypomagnesemia, hypercalcemia: Improved  HIV infection: Continue antiretroviral therapy.  He sees [name] as an  outpatient  Left foot wound, stage IV sacral decubitus ulcer: Present on admission.  Continue local wound care.  Acute on chronic anemia: S/p transfusion with 2 units of PRBCs on 04/07/2021.  No evidence of acute bleeding thus far.  H&H is stable  Consultants: Infectious disease  Procedures: None    Medications:    apixaban  2.5 mg Oral BID   Chlorhexidine Gluconate Cloth  6 each Topical Daily   darunavir-cobicistat  1 tablet Oral Daily   dolutegravir  50 mg Oral Daily   famotidine  10 mg Oral Daily   feeding supplement  237 mL Oral TID BM   rilpivirine  25 mg Oral Q breakfast   Continuous Infusions:  [START ON 03/20/2021] DAPTOmycin (CUBICIN)  IV     [START ON 03/20/2021] fluconazole (DIFLUCAN) IV       Anti-infectives (From admission, onward)    Start     Dose/Rate Route Frequency Ordered Stop   03/20/21 2000  DAPTOmycin (CUBICIN) 450 mg in sodium chloride 0.9 % IVPB        8 mg/kg  57.7 kg 118 mL/hr over 30 Minutes Intravenous Every 48 hours 03/18/21 1937     03/20/21 1000  fluconazole (DIFLUCAN) IVPB 200 mg        200 mg 100 mL/hr over 60 Minutes Intravenous Every 24 hours 03/19/21 0634     03/19/21 0730  fluconazole (DIFLUCAN) IVPB 400 mg        400 mg 100 mL/hr over 120 Minutes Intravenous  Once 03/19/21 0634 03/19/21 1016   03/18/21  2100  DAPTOmycin (CUBICIN) 450 mg in sodium chloride 0.9 % IVPB        8 mg/kg  57.7 kg 118 mL/hr over 30 Minutes Intravenous  Once 03/18/21 1937 03/18/21 2148   03/18/21 1800  ceFEPIme (MAXIPIME) 2 g in sodium chloride 0.9 % 100 mL IVPB  Status:  Discontinued        2 g 200 mL/hr over 30 Minutes Intravenous Every 24 hours 03/17/21 1854 03/18/21 2058   03/18/21 1000  darunavir-cobicistat (PREZCOBIX) 800-150 MG per tablet 1 tablet        1 tablet Oral Daily 03/17/21 2223     03/18/21 1000  dolutegravir (TIVICAY) tablet 50 mg        50 mg Oral Daily 03/17/21 2223     03/18/21 0800  metroNIDAZOLE (FLAGYL) IVPB 500 mg  Status:   Discontinued        500 mg 100 mL/hr over 60 Minutes Intravenous Every 12 hours 03/17/21 2145 03/18/21 2058   03/18/21 0800  rilpivirine (EDURANT) tablet 25 mg        25 mg Oral Daily with breakfast 03/17/21 2223     03/17/21 1854  vancomycin variable dose per unstable renal function (pharmacist dosing)  Status:  Discontinued         Does not apply See admin instructions 03/17/21 1854 03/18/21 1937   03/17/21 1700  ceFEPIme (MAXIPIME) 2 g in sodium chloride 0.9 % 100 mL IVPB        2 g 200 mL/hr over 30 Minutes Intravenous  Once 03/17/21 1657 03/17/21 1852   03/17/21 1700  metroNIDAZOLE (FLAGYL) IVPB 500 mg        500 mg 100 mL/hr over 60 Minutes Intravenous  Once 03/17/21 1657 03/17/21 1858   03/17/21 1700  vancomycin (VANCOREADY) IVPB 1250 mg/250 mL        1,250 mg 166.7 mL/hr over 90 Minutes Intravenous  Once 03/17/21 1657 03/17/21 2133              Family Communication/Anticipated D/C date and plan/Code Status   DVT prophylaxis: apixaban (ELIQUIS) tablet 2.5 mg Start: 03/17/21 2330 SCDs Start: 03/17/21 2143 apixaban (ELIQUIS) tablet 2.5 mg     Code Status: DNR  Family Communication: None Disposition Plan: Plan to discharge home when medically stable   Status is: Inpatient Remains inpatient appropriate because: IV antibiotics,      Subjective:   Interval events noted.  He has no complaints  Objective:    Vitals:   03/18/21 2343 03/19/21 0347 03/19/21 0800 03/19/21 1127  BP: 132/85 132/81 134/89 127/85  Pulse: 92 88 95 95  Resp: '18 16 20 20  '$ Temp: 98 F (36.7 C) 98.3 F (36.8 C) 97.9 F (36.6 C) 98.4 F (36.9 C)  TempSrc: Oral Oral Oral Oral  SpO2: 99% 99% 100% 100%  Weight:      Height:       No data found.   Intake/Output Summary (Last 24 hours) at 03/19/2021 1418 Last data filed at 03/19/2021 2683 Gross per 24 hour  Intake 879.63 ml  Output 1500 ml  Net -620.37 ml   Filed Weights   03/18/21 0112  Weight: 57.7 kg    Exam:  GEN:  NAD SKIN: Left foot wound, stage IV sacral decubitus ulcer EYES: No pallor or icterus ENT: MMM CV: RRR PULM: CTA B ABD: soft, ND, NT, +BS CNS: AAO x 3, paraparesis EXT: No edema or tenderness     Data Reviewed:   I have  personally reviewed following labs and imaging studies:  Labs: Labs show the following:   Basic Metabolic Panel: Recent Labs  Lab 03/17/21 1735 03/17/21 1755 03/18/21 0253 03/19/21 0140  NA 139 142 138 138  K 3.7 3.8 3.2* 3.4*  CL 109  --  110 113*  CO2 13*  --  16* 15*  GLUCOSE 155*  --  85 93  BUN 52*  --  46* 45*  CREATININE 2.96*  --  2.41* 2.83*  CALCIUM 10.7*  --  9.3 9.2  MG  --   --  1.6*   1.5* 1.9  PHOS  --   --  3.9  --    GFR Estimated Creatinine Clearance: 22.9 mL/min (A) (by C-G formula based on SCr of 2.83 mg/dL (H)). Liver Function Tests: Recent Labs  Lab 03/17/21 1735 03/18/21 0253  AST 23 15  ALT 19 14  ALKPHOS 77 55  BILITOT 0.3 0.4  PROT 9.1* 6.8  ALBUMIN 2.7* 2.0*   No results for input(s): LIPASE, AMYLASE in the last 168 hours. Recent Labs  Lab 03/17/21 1743  AMMONIA <10   Coagulation profile Recent Labs  Lab 03/17/21 1735  INR 1.8*    CBC: Recent Labs  Lab 03/17/21 1735 03/17/21 1755 03/18/21 0253 03/18/21 0613 03/18/21 1852  WBC 19.7*  --  9.7  --   --   NEUTROABS 12.5*  --  6.4  --   --   HGB 8.3* 9.9* 5.9* 5.8* 7.7*  HCT 30.0* 29.0* 20.3* 19.4* 23.8*  MCV 99.3  --  94.4  --   --   PLT 551*  --  338  --   --    Cardiac Enzymes: Recent Labs  Lab 03/18/21 0253  CKTOTAL 27*   BNP (last 3 results) No results for input(s): PROBNP in the last 8760 hours. CBG: No results for input(s): GLUCAP in the last 168 hours. D-Dimer: No results for input(s): DDIMER in the last 72 hours. Hgb A1c: No results for input(s): HGBA1C in the last 72 hours. Lipid Profile: No results for input(s): CHOL, HDL, LDLCALC, TRIG, CHOLHDL, LDLDIRECT in the last 72 hours. Thyroid function studies: Recent Labs     03/18/21 0253  TSH 4.239   Anemia work up: No results for input(s): VITAMINB12, FOLATE, FERRITIN, TIBC, IRON, RETICCTPCT in the last 72 hours. Sepsis Labs: Recent Labs  Lab 03/17/21 1714 03/17/21 1735 03/17/21 1856 03/18/21 0253 03/18/21 0350  PROCALCITON  --   --   --  0.29  --   WBC  --  19.7*  --  9.7  --   LATICACIDVEN 4.4*  --  3.2*  --  1.0    Microbiology Recent Results (from the past 240 hour(s))  Urine Culture     Status: Abnormal   Collection Time: 03/17/21  4:56 PM   Specimen: In/Out Cath Urine  Result Value Ref Range Status   Specimen Description IN/OUT CATH URINE  Final   Special Requests   Final    NONE Performed at [facility], [place] [address], [place], [place] [address]    Culture 70,000 COLONIES/mL YEAST (A)  Final   Report Status 03/19/2021 FINAL  Final  Blood Culture (routine x 2)     Status: None (Preliminary result)   Collection Time: 03/17/21  5:14 PM   Specimen: BLOOD  Result Value Ref Range Status   Specimen Description BLOOD BLOOD LEFT HAND  Final   Special Requests   Final  BOTTLES DRAWN AEROBIC ONLY Blood Culture results may not be optimal due to an inadequate volume of blood received in culture bottles   Culture   Final    NO GROWTH 2 DAYS Performed at [facility], [place] [address] [place], [place] [address]    Report Status PENDING  Incomplete  Blood Culture (routine x 2)     Status: Abnormal (Preliminary result)   Collection Time: 03/17/21  5:30 PM   Specimen: BLOOD  Result Value Ref Range Status   Specimen Description BLOOD RIGHT PICC LINE  Final   Special Requests   Final    BOTTLES DRAWN AEROBIC AND ANAEROBIC Blood Culture adequate volume   Culture  Setup Time   Final    AEROBIC BOTTLE ONLY YEAST CRITICAL RESULT CALLED TO, READ BACK BY AND VERIFIED WITH: PHARMD JAMES LEDFORD 03/19/21'@6'$ :32 BY TW Performed at [facility], [place] [address] [place], [place] [address]    Culture YEAST (A)  Final   Report Status  PENDING  Incomplete  Blood Culture ID Panel (Reflexed)     Status: Abnormal   Collection Time: 03/17/21  5:30 PM  Result Value Ref Range Status   Enterococcus faecalis NOT DETECTED NOT DETECTED Final   Enterococcus Faecium NOT DETECTED NOT DETECTED Final   Listeria monocytogenes NOT DETECTED NOT DETECTED Final   Staphylococcus species NOT DETECTED NOT DETECTED Final   Staphylococcus aureus (BCID) NOT DETECTED NOT DETECTED Final   Staphylococcus epidermidis NOT DETECTED NOT DETECTED Final   Staphylococcus lugdunensis NOT DETECTED NOT DETECTED Final   Streptococcus species NOT DETECTED NOT DETECTED Final   Streptococcus agalactiae NOT DETECTED NOT DETECTED Final   Streptococcus pneumoniae NOT DETECTED NOT DETECTED Final   Streptococcus pyogenes NOT DETECTED NOT DETECTED Final   A.calcoaceticus-baumannii NOT DETECTED NOT DETECTED Final   Bacteroides fragilis NOT DETECTED NOT DETECTED Final   Enterobacterales NOT DETECTED NOT DETECTED Final   Enterobacter cloacae complex NOT DETECTED NOT DETECTED Final   Escherichia coli NOT DETECTED NOT DETECTED Final   Klebsiella aerogenes NOT DETECTED NOT DETECTED Final   Klebsiella oxytoca NOT DETECTED NOT DETECTED Final   Klebsiella pneumoniae NOT DETECTED NOT DETECTED Final   Proteus species NOT DETECTED NOT DETECTED Final   Salmonella species NOT DETECTED NOT DETECTED Final   Serratia marcescens NOT DETECTED NOT DETECTED Final   Haemophilus influenzae NOT DETECTED NOT DETECTED Final   Neisseria meningitidis NOT DETECTED NOT DETECTED Final   Pseudomonas aeruginosa NOT DETECTED NOT DETECTED Final   Stenotrophomonas maltophilia NOT DETECTED NOT DETECTED Final   Candida albicans DETECTED (A) NOT DETECTED Final    Comment: CRITICAL RESULT CALLED TO, READ BACK BY AND VERIFIED WITH: PHARMD JAMES LEDFORD 03/19/21'@6'$ :32 BY TW    Candida auris NOT DETECTED NOT DETECTED Final   Candida glabrata NOT DETECTED NOT DETECTED Final   Candida krusei NOT DETECTED  NOT DETECTED Final   Candida parapsilosis NOT DETECTED NOT DETECTED Final   Candida tropicalis NOT DETECTED NOT DETECTED Final   Cryptococcus neoformans/gattii NOT DETECTED NOT DETECTED Final    Comment: Performed at [facility], [place] [address], [place], [place] [address]  Resp Panel by RT-PCR (Flu A&B, Covid) Nasopharyngeal Swab     Status: None   Collection Time: 03/17/21  6:22 PM   Specimen: Nasopharyngeal Swab; Nasopharyngeal(NP) swabs in vial transport medium  Result Value Ref Range Status   SARS Coronavirus 2 by RT PCR NEGATIVE NEGATIVE Final    Comment: (NOTE) SARS-CoV-2 target nucleic  acids are NOT DETECTED.  The SARS-CoV-2 RNA is generally detectable in upper respiratory specimens during the acute phase of infection. The lowest concentration of SARS-CoV-2 viral copies this assay can detect is 138 copies/mL. A negative result does not preclude SARS-Cov-2 infection and should not be used as the sole basis for treatment or other patient management decisions. A negative result may occur with  improper specimen collection/handling, submission of specimen other than nasopharyngeal swab, presence of viral mutation(s) within the areas targeted by this assay, and inadequate number of viral copies(<138 copies/mL). A negative result must be combined with clinical observations, patient history, and epidemiological information. The expected result is Negative.  Fact Sheet for Patients:  EntrepreneurPulse.com.au  Fact Sheet for Healthcare Providers:  IncredibleEmployment.be  This test is no t yet approved or cleared by the [place] FDA and  has been authorized for detection and/or diagnosis of SARS-CoV-2 by FDA under an Emergency Use Authorization (EUA). This EUA will remain  in effect (meaning this test can be used) for the duration of the COVID-19 declaration under Section 564(b)(1) of the Act, 21 U.S.C.section 360bbb-3(b)(1), unless  the authorization is terminated  or revoked sooner.       Influenza A by PCR NEGATIVE NEGATIVE Final   Influenza B by PCR NEGATIVE NEGATIVE Final    Comment: (NOTE) The Xpert Xpress SARS-CoV-2/FLU/RSV plus assay is intended as an aid in the diagnosis of influenza from Nasopharyngeal swab specimens and should not be used as a sole basis for treatment. Nasal washings and aspirates are unacceptable for Xpert Xpress SARS-CoV-2/FLU/RSV testing.  Fact Sheet for Patients: EntrepreneurPulse.com.au  Fact Sheet for Healthcare Providers: IncredibleEmployment.be  This test is not yet approved or cleared by the [place] FDA and has been authorized for detection and/or diagnosis of SARS-CoV-2 by FDA under an Emergency Use Authorization (EUA). This EUA will remain in effect (meaning this test can be used) for the duration of the COVID-19 declaration under Section 564(b)(1) of the Act, 21 U.S.C. section 360bbb-3(b)(1), unless the authorization is terminated or revoked.  Performed at [facility], [place] [address] [place], [place] [address]     Procedures and diagnostic studies:  DG Sacrum/Coccyx  Result Date: 03/18/2021 CLINICAL DATA:  [place] area of sacrum/coccyx.  Sepsis. EXAM: SACRUM AND COCCYX - 2+ VIEW COMPARISON:  Pelvis radiograph 05/20/2018 CT abdomen and pelvis 12/16/2020 FINDINGS: There is diffuse decreased bone mineralization. Mild bilateral sacroiliac joint space narrowing and subchondral sclerosis degenerative change. Total right hip arthroplasty hardware is incompletely imaged however there is no perihardware lucency to indicate hardware failure or loosening. Mild left femoroacetabular joint space narrowing and peripheral osteophytosis. There is high-grade thinning of the soft tissues posterior to the distal sacrum and proximal coccyx. This is similar to the appearance on prior 12/16/2020 CT with ulceration contacting the posterior bone  and mild posterior cortical erosion concerning for osteomyelitis. No definite progression from the prior CT. IMPRESSION: Soft tissue ulceration along the posterior distal sacrum and proximal coccyx similar to prior CT. Mild posterior cortical erosion appears similar to 12/16/2020 CT, within the limits of differences in modality. Electronically Signed   By: Yvonne Kendall M.D.   On: 03/18/2021 08:52   DG Chest Port 1 View  Result Date: 03/17/2021 CLINICAL DATA:  Questionable sepsis.  Tachypnea and tachycardia. EXAM: PORTABLE CHEST 1 VIEW COMPARISON:  Radiograph 10/09/2020 FINDINGS: Biapical pleuroparenchymal scarring.The cardiomediastinal contours are normal. The lungs are clear. Pulmonary vasculature is normal. No consolidation, pleural effusion, or pneumothorax. No acute  osseous abnormalities are seen. IMPRESSION: Biapical pleuroparenchymal scarring. No acute findings. Electronically Signed   By: Keith Rake M.D.   On: 03/17/2021 18:26   DG Foot Complete Left  Result Date: 03/18/2021 CLINICAL DATA:  Soft tissue wound over the fifth digit EXAM: LEFT FOOT - COMPLETE 3+ VIEW COMPARISON:  None. FINDINGS: Generalized osteopenia. Soft tissue wound overlying the fifth MTP joint. Bone destruction of the fifth metatarsal head concerning for osteomyelitis. Relative pes cavus. No acute fracture or dislocation. No aggressive osseous lesion. Normal alignment. Soft tissue are unremarkable. No radiopaque foreign body or soft tissue emphysema. IMPRESSION: 1. Soft tissue wound overlying the fifth MTP joint. Bone destruction of the fifth metatarsal head concerning for osteomyelitis. Electronically Signed   By: Kathreen Devoid M.D.   On: 03/18/2021 08:52               LOS: 2 days   Iridessa Harrow  [facility]   Pager on [email or web address]. If 7PM-7AM, please contact night-coverage at [email or web address]     03/19/2021, 2:18 PM

## 2021-03-19 NOTE — Progress Notes (Addendum)
[facility]   Reason for visit: Follow up on sepsis   Interval History: now blood cultures positive for yeast in 1/3 bottles, Candida albicans on BCID; has remained afebrile.  WBC down from 19.7 to 9.7.  Day 3 total antibiotics  Physical Exam: Constitutional:  Vitals:   03/19/21 0347 03/19/21 0800  BP: 132/81 134/89  Pulse: 88 95  Resp: 16 20  Temp: 98.3 F (36.8 C) 97.9 F (36.6 C)  SpO2: 99% 100%   patient appears in NAD Respiratory: Normal respiratory effort; CTA B Cardiovascular: RRR GI: soft, nt, nd  Review of Systems: Constitutional: negative for fevers and chills Integument/breast: negative for rash  Lab Results  Component Value Date   WBC 9.7 03/18/2021   HGB 7.7 (L) 03/18/2021   HCT 23.8 (L) 03/18/2021   MCV 94.4 03/18/2021   PLT 338 03/18/2021    Lab Results  Component Value Date   CREATININE 2.83 (H) 03/19/2021   BUN 45 (H) 03/19/2021   NA 138 03/19/2021   K 3.4 (L) 03/19/2021   CL 113 (H) 03/19/2021   CO2 15 (L) 03/19/2021    Lab Results  Component Value Date   ALT 14 03/18/2021   AST 15 03/18/2021   ALKPHOS 55 03/18/2021     Microbiology: Recent Results (from the past 240 hour(s))  Urine Culture     Status: Abnormal   Collection Time: 03/17/21  4:56 PM   Specimen: In/Out Cath Urine  Result Value Ref Range Status   Specimen Description IN/OUT CATH URINE  Final   Special Requests   Final    NONE Performed at [facility], [address] [place], [place] [address]    Culture 70,000 COLONIES/mL YEAST (A)  Final   Report Status 03/19/2021 FINAL  Final  Blood Culture (routine x 2)     Status: None (Preliminary result)   Collection Time: 03/17/21  5:14 PM   Specimen: BLOOD  Result Value Ref Range Status   Specimen Description BLOOD BLOOD LEFT HAND  Final   Special Requests   Final    BOTTLES DRAWN AEROBIC ONLY Blood Culture results may not be optimal due to an inadequate volume of blood received in  culture bottles   Culture   Final    NO GROWTH 2 DAYS Performed at [facility], [place] [address], [place], [place] [address]    Report Status PENDING  Incomplete  Blood Culture (routine x 2)     Status: Abnormal (Preliminary result)   Collection Time: 03/17/21  5:30 PM   Specimen: BLOOD  Result Value Ref Range Status   Specimen Description BLOOD RIGHT PICC LINE  Final   Special Requests   Final    BOTTLES DRAWN AEROBIC AND ANAEROBIC Blood Culture adequate volume   Culture  Setup Time   Final    AEROBIC BOTTLE ONLY YEAST CRITICAL RESULT CALLED TO, READ BACK BY AND VERIFIED WITH: PHARMD JAMES LEDFORD 03/19/21'@6'$ :32 BY TW Performed at [facility], [place] [address] [place], [place] [address]    Culture YEAST (A)  Final   Report Status PENDING  Incomplete  Blood Culture ID Panel (Reflexed)     Status: Abnormal   Collection Time: 03/17/21  5:30 PM  Result Value Ref Range Status   Enterococcus faecalis NOT DETECTED NOT DETECTED Final   Enterococcus Faecium NOT DETECTED NOT DETECTED Final   Listeria monocytogenes NOT DETECTED NOT DETECTED Final   Staphylococcus species NOT  DETECTED NOT DETECTED Final   Staphylococcus aureus (BCID) NOT DETECTED NOT DETECTED Final   Staphylococcus epidermidis NOT DETECTED NOT DETECTED Final   Staphylococcus lugdunensis NOT DETECTED NOT DETECTED Final   Streptococcus species NOT DETECTED NOT DETECTED Final   Streptococcus agalactiae NOT DETECTED NOT DETECTED Final   Streptococcus pneumoniae NOT DETECTED NOT DETECTED Final   Streptococcus pyogenes NOT DETECTED NOT DETECTED Final   A.calcoaceticus-baumannii NOT DETECTED NOT DETECTED Final   Bacteroides fragilis NOT DETECTED NOT DETECTED Final   Enterobacterales NOT DETECTED NOT DETECTED Final   Enterobacter cloacae complex NOT DETECTED NOT DETECTED Final   Escherichia coli NOT DETECTED NOT DETECTED Final   Klebsiella aerogenes NOT DETECTED NOT DETECTED Final   Klebsiella oxytoca NOT DETECTED NOT  DETECTED Final   Klebsiella pneumoniae NOT DETECTED NOT DETECTED Final   Proteus species NOT DETECTED NOT DETECTED Final   Salmonella species NOT DETECTED NOT DETECTED Final   Serratia marcescens NOT DETECTED NOT DETECTED Final   Haemophilus influenzae NOT DETECTED NOT DETECTED Final   Neisseria meningitidis NOT DETECTED NOT DETECTED Final   Pseudomonas aeruginosa NOT DETECTED NOT DETECTED Final   Stenotrophomonas maltophilia NOT DETECTED NOT DETECTED Final   Candida albicans DETECTED (A) NOT DETECTED Final    Comment: CRITICAL RESULT CALLED TO, READ BACK BY AND VERIFIED WITH: PHARMD JAMES LEDFORD 03/19/21'@6'$ :32 BY TW    Candida auris NOT DETECTED NOT DETECTED Final   Candida glabrata NOT DETECTED NOT DETECTED Final   Candida krusei NOT DETECTED NOT DETECTED Final   Candida parapsilosis NOT DETECTED NOT DETECTED Final   Candida tropicalis NOT DETECTED NOT DETECTED Final   Cryptococcus neoformans/gattii NOT DETECTED NOT DETECTED Final    Comment: Performed at [facility], [address], [place], [place] [address]  Resp Panel by RT-PCR (Flu A&B, Covid) Nasopharyngeal Swab     Status: None   Collection Time: 03/17/21  6:22 PM   Specimen: Nasopharyngeal Swab; Nasopharyngeal(NP) swabs in vial transport medium  Result Value Ref Range Status   SARS Coronavirus 2 by RT PCR NEGATIVE NEGATIVE Final    Comment: (NOTE) SARS-CoV-2 target nucleic acids are NOT DETECTED.  The SARS-CoV-2 RNA is generally detectable in upper respiratory specimens during the acute phase of infection. The lowest concentration of SARS-CoV-2 viral copies this assay can detect is 138 copies/mL. A negative result does not preclude SARS-Cov-2 infection and should not be used as the sole basis for treatment or other patient management decisions. A negative result may occur with  improper specimen collection/handling, submission of specimen other than nasopharyngeal swab, presence of viral mutation(s) within  the areas targeted by this assay, and inadequate number of viral copies(<138 copies/mL). A negative result must be combined with clinical observations, patient history, and epidemiological information. The expected result is Negative.  Fact Sheet for Patients:  EntrepreneurPulse.com.au  Fact Sheet for Healthcare Providers:  IncredibleEmployment.be  This test is no t yet approved or cleared by the [place] FDA and  has been authorized for detection and/or diagnosis of SARS-CoV-2 by FDA under an Emergency Use Authorization (EUA). This EUA will remain  in effect (meaning this test can be used) for the duration of the COVID-19 declaration under Section 564(b)(1) of the Act, 21 U.S.C.section 360bbb-3(b)(1), unless the authorization is terminated  or revoked sooner.       Influenza A by PCR NEGATIVE NEGATIVE Final   Influenza B by PCR NEGATIVE NEGATIVE Final    Comment: (NOTE) The Xpert Xpress SARS-CoV-2/FLU/RSV plus assay is intended  as an aid in the diagnosis of influenza from Nasopharyngeal swab specimens and should not be used as a sole basis for treatment. Nasal washings and aspirates are unacceptable for Xpert Xpress SARS-CoV-2/FLU/RSV testing.  Fact Sheet for Patients: EntrepreneurPulse.com.au  Fact Sheet for Healthcare Providers: IncredibleEmployment.be  This test is not yet approved or cleared by the [place] FDA and has been authorized for detection and/or diagnosis of SARS-CoV-2 by FDA under an Emergency Use Authorization (EUA). This EUA will remain in effect (meaning this test can be used) for the duration of the COVID-19 declaration under Section 564(b)(1) of the Act, 21 U.S.C. section 360bbb-3(b)(1), unless the authorization is terminated or revoked.  Performed at [facility], [place] [address], [place], [place] [address]     Impression/Plan:  1. Candidemia - C albicans in 1  blood culture bottle in the setting of chronic wounds and picc line.  Fluconazole started.  He has had no fever. It is unclear if this is related to an active infection or transient but will continue to monitor blood cultures, repeat the blood cultures and check a TTE.    2.  Leukocytosis - his initial WBC was 19.7 on admission and resolved after fluid resuscitation.  This is most consistent with a dilutional effect, less likely related to acute infection.  No fever or other signs of acute infection other than his left foot.  Will continue to monitor.   3.  Left foot wound - seen by [facility] nurse and 5th digit at plantar and lateral area with full thickness wound and 4th metatarsal with black, non-viable tissue.  I believe orthopedics has been consulted.  No active drainage. Xray suggests osteomyelitis with bone destruction of the fifth metatarsal head.   This is chronic in nature.    4.  HIV - most recent CD4 count 487, viral load 120 in October and much improved from his previous numbers last year.  He will continue with his current regimen.   5. Acute renal insufficiency - creat up from his baseline and on IV fluids with likelihood of a prerenal state from dehydration.  Have stopped vancomycin and on daptomycin instead to reduce nephrotoxicity.    6.  Non-healing stage 4 sacral ulcer - no signs of acute infection and some slow improvement over time.  He will continue with wound care.   7.  Anemia - significant reduction in his Hgb to 5.9 and required transfusion.  Occult blood negative in stool.  Monitoring daily.   I have personally spent 50 minutes involved in face-to-face and non-face-to-face activities for this patient on the day of the visit + 15 minutes additional time. Professional time spent includes the following activities: Preparing to see the patient (review of tests), Obtaining and/or reviewing separately obtained history (admission/discharge record), Performing a medically appropriate  examination and/or evaluation , Ordering medications/tests/procedures, referring and communicating with other health care professionals, Documenting clinical information in the EMR, Independently interpreting results (not separately reported), Communicating results to the patient/family/caregiver, Counseling and educating the patient/family/caregiver and Care coordination (not separately reported).

## 2021-03-19 NOTE — Progress Notes (Signed)
?  Echocardiogram ?2D Echocardiogram has been performed. ? ?[name] [name] ?03/19/2021, 2:28 PM ?

## 2021-03-19 NOTE — Progress Notes (Signed)
PHARMACY - PHYSICIAN COMMUNICATION ?CRITICAL VALUE ALERT - BLOOD CULTURE IDENTIFICATION (BCID) ? ?[name] [name] is an 61 y.o. male who presented to [facility] on 03/17/2021 with a chief complaint of sepsis ? ?Assessment:  WBC WNL, noted renal dysfunction ? ?Name of physician (or Provider) Contacted: Dr. Cyd Silence ? ?Current antibiotics: Daptomycin  ? ?Changes to prescribed antibiotics recommended:  ?Add Fluconazole 400 mg IV x 1, then 200 mg IV q24h ? ?Results for orders placed or performed during the hospital encounter of 03/17/21  ?Blood Culture ID Panel (Reflexed) (Collected: 03/17/2021  5:30 PM)  ?Result Value Ref Range  ? Enterococcus faecalis NOT DETECTED NOT DETECTED  ? Enterococcus Faecium NOT DETECTED NOT DETECTED  ? Listeria monocytogenes NOT DETECTED NOT DETECTED  ? Staphylococcus species NOT DETECTED NOT DETECTED  ? Staphylococcus aureus (BCID) NOT DETECTED NOT DETECTED  ? Staphylococcus epidermidis NOT DETECTED NOT DETECTED  ? Staphylococcus lugdunensis NOT DETECTED NOT DETECTED  ? Streptococcus species NOT DETECTED NOT DETECTED  ? Streptococcus agalactiae NOT DETECTED NOT DETECTED  ? Streptococcus pneumoniae NOT DETECTED NOT DETECTED  ? Streptococcus pyogenes NOT DETECTED NOT DETECTED  ? A.calcoaceticus-baumannii NOT DETECTED NOT DETECTED  ? Bacteroides fragilis NOT DETECTED NOT DETECTED  ? Enterobacterales NOT DETECTED NOT DETECTED  ? Enterobacter cloacae complex NOT DETECTED NOT DETECTED  ? Escherichia coli NOT DETECTED NOT DETECTED  ? Klebsiella aerogenes NOT DETECTED NOT DETECTED  ? Klebsiella oxytoca NOT DETECTED NOT DETECTED  ? Klebsiella pneumoniae NOT DETECTED NOT DETECTED  ? Proteus species NOT DETECTED NOT DETECTED  ? Salmonella species NOT DETECTED NOT DETECTED  ? Serratia marcescens NOT DETECTED NOT DETECTED  ? Haemophilus influenzae NOT DETECTED NOT DETECTED  ? Neisseria meningitidis NOT DETECTED NOT DETECTED  ? Pseudomonas aeruginosa NOT DETECTED NOT DETECTED  ? Stenotrophomonas maltophilia  NOT DETECTED NOT DETECTED  ? Candida albicans DETECTED (A) NOT DETECTED  ? Candida auris NOT DETECTED NOT DETECTED  ? Candida glabrata NOT DETECTED NOT DETECTED  ? Candida krusei NOT DETECTED NOT DETECTED  ? Candida parapsilosis NOT DETECTED NOT DETECTED  ? Candida tropicalis NOT DETECTED NOT DETECTED  ? Cryptococcus neoformans/gattii NOT DETECTED NOT DETECTED  ? ? ?[name] ?03/19/2021  6:35 AM ? ?

## 2021-03-19 NOTE — Progress Notes (Signed)
Patient refusing to let  nurse do dressing change on sacrum.  Education provided.   ?

## 2021-03-20 DIAGNOSIS — G9341 Metabolic encephalopathy: Secondary | ICD-10-CM | POA: Diagnosis not present

## 2021-03-20 DIAGNOSIS — R6520 Severe sepsis without septic shock: Secondary | ICD-10-CM | POA: Diagnosis not present

## 2021-03-20 DIAGNOSIS — A419 Sepsis, unspecified organism: Secondary | ICD-10-CM | POA: Diagnosis not present

## 2021-03-20 LAB — CBC WITH DIFFERENTIAL/PLATELET
Abs Immature Granulocytes: 0.09 10*3/uL — ABNORMAL HIGH (ref 0.00–0.07)
Basophils Absolute: 0.1 10*3/uL (ref 0.0–0.1)
Basophils Relative: 1 %
Eosinophils Absolute: 0.2 10*3/uL (ref 0.0–0.5)
Eosinophils Relative: 2 %
HCT: 27.5 % — ABNORMAL LOW (ref 39.0–52.0)
Hemoglobin: 8.2 g/dL — ABNORMAL LOW (ref 13.0–17.0)
Immature Granulocytes: 1 %
Lymphocytes Relative: 17 %
Lymphs Abs: 2.2 10*3/uL (ref 0.7–4.0)
MCH: 28.3 pg (ref 26.0–34.0)
MCHC: 29.8 g/dL — ABNORMAL LOW (ref 30.0–36.0)
MCV: 94.8 fL (ref 80.0–100.0)
Monocytes Absolute: 0.8 10*3/uL (ref 0.1–1.0)
Monocytes Relative: 6 %
Neutro Abs: 9.6 10*3/uL — ABNORMAL HIGH (ref 1.7–7.7)
Neutrophils Relative %: 73 %
Platelets: 296 10*3/uL (ref 150–400)
RBC: 2.9 MIL/uL — ABNORMAL LOW (ref 4.22–5.81)
RDW: 17.2 % — ABNORMAL HIGH (ref 11.5–15.5)
WBC: 12.9 10*3/uL — ABNORMAL HIGH (ref 4.0–10.5)
nRBC: 0 % (ref 0.0–0.2)

## 2021-03-20 LAB — BASIC METABOLIC PANEL
Anion gap: 8 (ref 5–15)
BUN: 45 mg/dL — ABNORMAL HIGH (ref 6–20)
CO2: 18 mmol/L — ABNORMAL LOW (ref 22–32)
Calcium: 9.8 mg/dL (ref 8.9–10.3)
Chloride: 116 mmol/L — ABNORMAL HIGH (ref 98–111)
Creatinine, Ser: 2.94 mg/dL — ABNORMAL HIGH (ref 0.61–1.24)
GFR, Estimated: 24 mL/min — ABNORMAL LOW (ref >60)
Glucose, Bld: 138 mg/dL — ABNORMAL HIGH (ref 70–99)
Potassium: 4.2 mmol/L (ref 3.5–5.1)
Sodium: 142 mmol/L (ref 135–145)

## 2021-03-20 LAB — CALCIUM, IONIZED: Calcium, Ionized, Serum: 5.7 mg/dL — ABNORMAL HIGH (ref 4.5–5.6)

## 2021-03-20 LAB — MAGNESIUM: Magnesium: 1.9 mg/dL (ref 1.7–2.4)

## 2021-03-20 MED ORDER — LACTATED RINGERS IV SOLN: INTRAVENOUS | Status: DC

## 2021-03-20 NOTE — Progress Notes (Signed)
[name]ious [facility]   Reason for visit: Follow up on sepsis   Interval History: no new positive blood cultures; WBC 12.9, remains afebrile.  Day 4 total antibiotics  Physical Exam: Constitutional:  Vitals:   03/19/21 2138 03/20/21 0408  BP: 135/81 133/84  Pulse: 92 89  Resp: 18 18  Temp: 98.3 F (36.8 C) 98.3 F (36.8 C)  SpO2: 100% 100%  He is in nad Respiratory: Normal respiratory effort Cardiovascular: RRR GI: soft, nt, nd  Review of Systems: Constitutional: negative for fevers and chills Integument/breast: negative for rash  Lab Results  Component Value Date   WBC 12.9 (H) 03/20/2021   HGB 8.2 (L) 03/20/2021   HCT 27.5 (L) 03/20/2021   MCV 94.8 03/20/2021   PLT 296 03/20/2021    Lab Results  Component Value Date   CREATININE 2.94 (H) 03/20/2021   BUN 45 (H) 03/20/2021   NA 142 03/20/2021   K 4.2 03/20/2021   CL 116 (H) 03/20/2021   CO2 18 (L) 03/20/2021    Lab Results  Component Value Date   ALT 14 03/18/2021   AST 15 03/18/2021   ALKPHOS 55 03/18/2021     Microbiology: Recent Results (from the past 240 hour(s))  Urine Culture     Status: Abnormal   Collection Time: 03/17/21  4:56 PM   Specimen: In/Out Cath Urine  Result Value Ref Range Status   Specimen Description IN/OUT CATH URINE  Final   Special Requests   Final    NONE Performed at [facility], [address], [place], [place] [address]    Culture 70,000 COLONIES/mL YEAST (A)  Final   Report Status 03/19/2021 FINAL  Final  Blood Culture (routine x 2)     Status: None (Preliminary result)   Collection Time: 03/17/21  5:14 PM   Specimen: BLOOD  Result Value Ref Range Status   Specimen Description BLOOD BLOOD LEFT HAND  Final   Special Requests   Final    BOTTLES DRAWN AEROBIC ONLY Blood Culture results may not be optimal due to an inadequate volume of blood received in culture bottles   Culture   Final    NO GROWTH 3 DAYS Performed at [place], [place] [address] [place], [place] [address]    Report Status PENDING  Incomplete  Blood Culture (routine x 2)     Status: Abnormal (Preliminary result)   Collection Time: 03/17/21  5:30 PM   Specimen: BLOOD  Result Value Ref Range Status   Specimen Description BLOOD RIGHT PICC LINE  Final   Special Requests   Final    BOTTLES DRAWN AEROBIC AND ANAEROBIC Blood Culture adequate volume   Culture  Setup Time   Final    AEROBIC BOTTLE ONLY YEAST CRITICAL RESULT CALLED TO, READ BACK BY AND VERIFIED WITH: PHARMD JAMES LEDFORD 03/19/21'@6'$ :32 BY TW    Culture (A)  Final    CANDIDA ALBICANS CULTURE REINCUBATED FOR BETTER GROWTH Performed at [facility], [place] [address] [place], [place] [address]    Report Status PENDING  Incomplete  Blood Culture ID Panel (Reflexed)     Status: Abnormal   Collection Time: 03/17/21  5:30 PM  Result Value Ref Range Status   Enterococcus faecalis NOT DETECTED NOT DETECTED Final   Enterococcus Faecium NOT DETECTED NOT DETECTED Final   Listeria monocytogenes NOT DETECTED NOT DETECTED Final   Staphylococcus species NOT DETECTED NOT DETECTED Final   Staphylococcus  aureus (BCID) NOT DETECTED NOT DETECTED Final   Staphylococcus epidermidis NOT DETECTED NOT DETECTED Final   Staphylococcus lugdunensis NOT DETECTED NOT DETECTED Final   Streptococcus species NOT DETECTED NOT DETECTED Final   Streptococcus agalactiae NOT DETECTED NOT DETECTED Final   Streptococcus pneumoniae NOT DETECTED NOT DETECTED Final   Streptococcus pyogenes NOT DETECTED NOT DETECTED Final   A.calcoaceticus-baumannii NOT DETECTED NOT DETECTED Final   Bacteroides fragilis NOT DETECTED NOT DETECTED Final   Enterobacterales NOT DETECTED NOT DETECTED Final   Enterobacter cloacae complex NOT DETECTED NOT DETECTED Final   Escherichia coli NOT DETECTED NOT DETECTED Final   Klebsiella aerogenes NOT DETECTED NOT DETECTED Final   Klebsiella oxytoca NOT DETECTED NOT DETECTED Final   Klebsiella  pneumoniae NOT DETECTED NOT DETECTED Final   Proteus species NOT DETECTED NOT DETECTED Final   Salmonella species NOT DETECTED NOT DETECTED Final   Serratia marcescens NOT DETECTED NOT DETECTED Final   Haemophilus influenzae NOT DETECTED NOT DETECTED Final   Neisseria meningitidis NOT DETECTED NOT DETECTED Final   Pseudomonas aeruginosa NOT DETECTED NOT DETECTED Final   Stenotrophomonas maltophilia NOT DETECTED NOT DETECTED Final   Candida albicans DETECTED (A) NOT DETECTED Final    Comment: CRITICAL RESULT CALLED TO, READ BACK BY AND VERIFIED WITH: PHARMD JAMES LEDFORD 03/19/21'@6'$ :32 BY TW    Candida auris NOT DETECTED NOT DETECTED Final   Candida glabrata NOT DETECTED NOT DETECTED Final   Candida krusei NOT DETECTED NOT DETECTED Final   Candida parapsilosis NOT DETECTED NOT DETECTED Final   Candida tropicalis NOT DETECTED NOT DETECTED Final   Cryptococcus neoformans/gattii NOT DETECTED NOT DETECTED Final    Comment: Performed at [facility], [address], [place], [place] [address]  Resp Panel by RT-PCR (Flu A&B, Covid) Nasopharyngeal Swab     Status: None   Collection Time: 03/17/21  6:22 PM   Specimen: Nasopharyngeal Swab; Nasopharyngeal(NP) swabs in vial transport medium  Result Value Ref Range Status   SARS Coronavirus 2 by RT PCR NEGATIVE NEGATIVE Final    Comment: (NOTE) SARS-CoV-2 target nucleic acids are NOT DETECTED.  The SARS-CoV-2 RNA is generally detectable in upper respiratory specimens during the acute phase of infection. The lowest concentration of SARS-CoV-2 viral copies this assay can detect is 138 copies/mL. A negative result does not preclude SARS-Cov-2 infection and should not be used as the sole basis for treatment or other patient management decisions. A negative result may occur with  improper specimen collection/handling, submission of specimen other than nasopharyngeal swab, presence of viral mutation(s) within the areas targeted by this assay, and  inadequate number of viral copies(<138 copies/mL). A negative result must be combined with clinical observations, patient history, and epidemiological information. The expected result is Negative.  Fact Sheet for Patients:  EntrepreneurPulse.com.au  Fact Sheet for Healthcare Providers:  IncredibleEmployment.be  This test is no t yet approved or cleared by the [place] FDA and  has been authorized for detection and/or diagnosis of SARS-CoV-2 by FDA under an Emergency Use Authorization (EUA). This EUA will remain  in effect (meaning this test can be used) for the duration of the COVID-19 declaration under Section 564(b)(1) of the Act, 21 U.S.C.section 360bbb-3(b)(1), unless the authorization is terminated  or revoked sooner.       Influenza A by PCR NEGATIVE NEGATIVE Final   Influenza B by PCR NEGATIVE NEGATIVE Final    Comment: (NOTE) The Xpert Xpress SARS-CoV-2/FLU/RSV plus assay is intended as an aid in the diagnosis of  influenza from Nasopharyngeal swab specimens and should not be used as a sole basis for treatment. Nasal washings and aspirates are unacceptable for Xpert Xpress SARS-CoV-2/FLU/RSV testing.  Fact Sheet for Patients: EntrepreneurPulse.com.au  Fact Sheet for Healthcare Providers: IncredibleEmployment.be  This test is not yet approved or cleared by the [place] FDA and has been authorized for detection and/or diagnosis of SARS-CoV-2 by FDA under an Emergency Use Authorization (EUA). This EUA will remain in effect (meaning this test can be used) for the duration of the COVID-19 declaration under Section 564(b)(1) of the Act, 21 U.S.C. section 360bbb-3(b)(1), unless the authorization is terminated or revoked.  Performed at [facility], [place] [address], [place], [place] [address]   Culture, blood (routine x 2)     Status: None (Preliminary result)   Collection Time:  03/19/21 11:51 AM   Specimen: BLOOD  Result Value Ref Range Status   Specimen Description BLOOD SITE NOT SPECIFIED  Final   Special Requests   Final    BOTTLES DRAWN AEROBIC ONLY Blood Culture results may not be optimal due to an inadequate volume of blood received in culture bottles   Culture   Final    NO GROWTH < 24 HOURS Performed at [facility], [place] [address], [place], [place] [address]    Report Status PENDING  Incomplete  Culture, blood (routine x 2)     Status: None (Preliminary result)   Collection Time: 03/19/21 11:51 AM   Specimen: BLOOD  Result Value Ref Range Status   Specimen Description BLOOD SITE NOT SPECIFIED  Final   Special Requests   Final    BOTTLES DRAWN AEROBIC ONLY Blood Culture results may not be optimal due to an inadequate volume of blood received in culture bottles   Culture   Final    NO GROWTH < 24 HOURS Performed at [facility], [place] [address] [place], [place] [address]    Report Status PENDING  Incomplete    Impression/Plan:  1. Candidemia - C albicans and on fluconazole.  Repeat blood cultures without growth to date.   TTE noted and some concerns on the mitral valve.   Will need a TEE to rule out endocarditis.   2.  Leukocytosis - a bit higher today at 12.9 but no new concerns.  No other positive cultures outside of the Candida.  At this point, I will stop his daptomycin and observe off of antibacterial treatment.   3.  Left foot wound - seen by [facility] nurse and 5th digit at plantar and lateral area with full thickness wound and 4th metatarsal with black, non-viable tissue.  I believe orthopedics has been consulted.  No active drainage. Xray suggests osteomyelitis with bone destruction of the fifth metatarsal head.   This is chronic in nature.    4.  HIV - CD4 noted of 487 and he has been doing well on his regimen.  No changes indicated at this time.   5. Acute kidney injury - creat has remains elevated and IVFs have been restarted.   He is on daptomycin in place of vancomycin.    6.  Non-healing stage 4 sacral ulcer - no signs of acute infection and some slow improvement over time.  He will continue with wound care.

## 2021-03-20 NOTE — Progress Notes (Addendum)
Progress Note    TRAMPUS MCQUERRY  CLE:[id number] DOB: Dec 21, 1960  DOA: 03/17/2021 PCP: [name], MD      Brief Narrative:    Medical records reviewed and are as summarized below:   This 61 years old male with PMH significant for HIV/AIDS on chronic antiviral therapy, rectal cancer, sacral decubitus ulcers, chronic anemia with baseline hemoglobin 7-9, chronic urinary retention in the setting of urethral stricture with chronic indwelling Foley catheter, chronic diastolic heart failure, functional quadriplegia, CKD stage IIIA associated with baseline serum creatinine 1.4-1.7.  He was brought to the hospital by his sister because of confusion.    He was admitted to the hospital for severe sepsis.     Assessment/Plan:   Principal Problem:   Acute metabolic encephalopathy Active Problems:   Severe sepsis (HCC)   Human immunodeficiency virus (HIV) disease (HCC)   Protein-calorie malnutrition, severe ([place])   Acute renal failure superimposed on stage 3a chronic kidney disease (HCC)   Chronic diastolic CHF (congestive heart failure) (HCC)   Normochromic normocytic anemia   High anion gap metabolic acidosis   Hypercalcemia   Nutrition Problem: Severe Malnutrition Etiology: chronic illness (HIV/AIDS)  Signs/Symptoms: severe fat depletion, severe muscle depletion   Body mass index is 16.34 kg/m.  Diet Order             Diet regular Room service appropriate? Yes; Fluid consistency: Thin  Diet effective now                   Severe sepsis, candidemia in an immunocompromised patient, acute UTI: 1 out of 3 blood culture bottles showed Candida albicans.  Yeast in urine culture.  He is on IV daptomycin and IV fluconazole.  2D echo showed estimated EF of 55 to 60% and normal LV diastolic parameters.  Structural findings on the mitral valve was inconclusive on 2D echo and TEE was recommended.  Follow-up with ID for further recommendations.  Acute metabolic  encephalopathy: Improving.  AKI on CKD stage IIIa, metabolic acidosis: Creatinine is getting worse.  Restart IV fluids and monitor BMP.  Hypomagnesemia, hypercalcemia: Improved  HIV infection: Continue antiretroviral therapy.  He sees [name] as an outpatient  Left foot wound, stage IV sacral decubitus ulcer: Present on admission.  Continue local wound care.  Acute on chronic anemia: S/p transfusion with 2 units of PRBCs on 04/07/2021.  He was found to be bleeding from sacral wound but bleeding has stopped.  H&H is stable  Consultants: Infectious disease  Procedures: None    Medications:    apixaban  2.5 mg Oral BID   Chlorhexidine Gluconate Cloth  6 each Topical Daily   darunavir-cobicistat  1 tablet Oral Daily   dolutegravir  50 mg Oral Daily   famotidine  10 mg Oral Daily   feeding supplement  237 mL Oral TID BM   rilpivirine  25 mg Oral Q breakfast   Continuous Infusions:  DAPTOmycin (CUBICIN)  IV     fluconazole (DIFLUCAN) IV 200 mg (03/20/21 1009)   lactated ringers 100 mL/hr at 03/20/21 1007     Anti-infectives (From admission, onward)    Start     Dose/Rate Route Frequency Ordered Stop   03/20/21 2000  DAPTOmycin (CUBICIN) 450 mg in sodium chloride 0.9 % IVPB        8 mg/kg  57.7 kg 118 mL/hr over 30 Minutes Intravenous Every 48 hours 03/18/21 1937     03/20/21 1000  fluconazole (DIFLUCAN) IVPB  200 mg        200 mg 100 mL/hr over 60 Minutes Intravenous Every 24 hours 03/19/21 0634     03/19/21 0730  fluconazole (DIFLUCAN) IVPB 400 mg        400 mg 100 mL/hr over 120 Minutes Intravenous  Once 03/19/21 0634 03/19/21 1016   03/18/21 2100  DAPTOmycin (CUBICIN) 450 mg in sodium chloride 0.9 % IVPB        8 mg/kg  57.7 kg 118 mL/hr over 30 Minutes Intravenous  Once 03/18/21 1937 03/18/21 2148   03/18/21 1800  ceFEPIme (MAXIPIME) 2 g in sodium chloride 0.9 % 100 mL IVPB  Status:  Discontinued        2 g 200 mL/hr over 30 Minutes Intravenous Every 24 hours  03/17/21 1854 03/18/21 2058   03/18/21 1000  darunavir-cobicistat (PREZCOBIX) 800-150 MG per tablet 1 tablet        1 tablet Oral Daily 03/17/21 2223     03/18/21 1000  dolutegravir (TIVICAY) tablet 50 mg        50 mg Oral Daily 03/17/21 2223     03/18/21 0800  metroNIDAZOLE (FLAGYL) IVPB 500 mg  Status:  Discontinued        500 mg 100 mL/hr over 60 Minutes Intravenous Every 12 hours 03/17/21 2145 03/18/21 2058   03/18/21 0800  rilpivirine (EDURANT) tablet 25 mg        25 mg Oral Daily with breakfast 03/17/21 2223     03/17/21 1854  vancomycin variable dose per unstable renal function (pharmacist dosing)  Status:  Discontinued         Does not apply See admin instructions 03/17/21 1854 03/18/21 1937   03/17/21 1700  ceFEPIme (MAXIPIME) 2 g in sodium chloride 0.9 % 100 mL IVPB        2 g 200 mL/hr over 30 Minutes Intravenous  Once 03/17/21 1657 03/17/21 1852   03/17/21 1700  metroNIDAZOLE (FLAGYL) IVPB 500 mg        500 mg 100 mL/hr over 60 Minutes Intravenous  Once 03/17/21 1657 03/17/21 1858   03/17/21 1700  vancomycin (VANCOREADY) IVPB 1250 mg/250 mL        1,250 mg 166.7 mL/hr over 90 Minutes Intravenous  Once 03/17/21 1657 03/17/21 2133              Family Communication/Anticipated D/C date and plan/Code Status   DVT prophylaxis: apixaban (ELIQUIS) tablet 2.5 mg Start: 03/17/21 2330 SCDs Start: 03/17/21 2143 apixaban (ELIQUIS) tablet 2.5 mg     Code Status: DNR  Family Communication: None Disposition Plan: Plan to discharge home when medically stable   Status is: Inpatient Remains inpatient appropriate because: IV antibiotics,      Subjective:   Interval events noted.  Georgina Peer, RN, reported that the patient was bleeding from sacral wound.  Objective:    Vitals:   03/19/21 1127 03/19/21 1647 03/19/21 2138 03/20/21 0408  BP: 127/85 140/87 135/81 133/84  Pulse: 95 97 92 89  Resp: '20 18 18 18  '$ Temp: 98.4 F (36.9 C) 98 F (36.7 C) 98.3 F (36.8 C) 98.3  F (36.8 C)  TempSrc: Oral Oral Oral Oral  SpO2: 100% 99% 100% 100%  Weight:      Height:       No data found.   Intake/Output Summary (Last 24 hours) at 03/20/2021 1214 Last data filed at 03/20/2021 0415 Gross per 24 hour  Intake 417 ml  Output 1200 ml  Net -783 ml  Filed Weights   03/18/21 0112  Weight: 57.7 kg    Exam:  GEN: NAD SKIN: Stage IV sacral decubitus ulcer without active bleeding noted at time of my exam.  Left foot wound. EYES: No pallor or icterus ENT: MMM CV: RRR PULM: CTA B ABD: soft, ND, NT, +BS CNS: AAO x 2 (person and place), paraparesis EXT: No edema or tenderness            Data Reviewed:   I have personally reviewed following labs and imaging studies:  Labs: Labs show the following:   Basic Metabolic Panel: Recent Labs  Lab 03/17/21 1735 03/17/21 1755 03/18/21 0253 03/19/21 0140 03/20/21 0019  NA 139 142 138 138 142  K 3.7 3.8 3.2* 3.4* 4.2  CL 109  --  110 113* 116*  CO2 13*  --  16* 15* 18*  GLUCOSE 155*  --  85 93 138*  BUN 52*  --  46* 45* 45*  CREATININE 2.96*  --  2.41* 2.83* 2.94*  CALCIUM 10.7*  --  9.3 9.2 9.8  MG  --   --  1.6*   1.5* 1.9 1.9  PHOS  --   --  3.9  --   --    GFR Estimated Creatinine Clearance: 22.1 mL/min (A) (by C-G formula based on SCr of 2.94 mg/dL (H)). Liver Function Tests: Recent Labs  Lab 03/17/21 1735 03/18/21 0253  AST 23 15  ALT 19 14  ALKPHOS 77 55  BILITOT 0.3 0.4  PROT 9.1* 6.8  ALBUMIN 2.7* 2.0*   No results for input(s): LIPASE, AMYLASE in the last 168 hours. Recent Labs  Lab 03/17/21 1743  AMMONIA <10   Coagulation profile Recent Labs  Lab 03/17/21 1735  INR 1.8*    CBC: Recent Labs  Lab 03/17/21 1735 03/17/21 1755 03/18/21 0253 03/18/21 0613 03/18/21 1852 03/20/21 0019  WBC 19.7*  --  9.7  --   --  12.9*  NEUTROABS 12.5*  --  6.4  --   --  9.6*  HGB 8.3* 9.9* 5.9* 5.8* 7.7* 8.2*  HCT 30.0* 29.0* 20.3* 19.4* 23.8* 27.5*  MCV 99.3  --  94.4  --    --  94.8  PLT 551*  --  338  --   --  296   Cardiac Enzymes: Recent Labs  Lab 03/18/21 0253  CKTOTAL 27*   BNP (last 3 results) No results for input(s): PROBNP in the last 8760 hours. CBG: No results for input(s): GLUCAP in the last 168 hours. D-Dimer: No results for input(s): DDIMER in the last 72 hours. Hgb A1c: No results for input(s): HGBA1C in the last 72 hours. Lipid Profile: No results for input(s): CHOL, HDL, LDLCALC, TRIG, CHOLHDL, LDLDIRECT in the last 72 hours. Thyroid function studies: Recent Labs    03/18/21 0253  TSH 4.239   Anemia work up: No results for input(s): VITAMINB12, FOLATE, FERRITIN, TIBC, IRON, RETICCTPCT in the last 72 hours. Sepsis Labs: Recent Labs  Lab 03/17/21 1714 03/17/21 1735 03/17/21 1856 03/18/21 0253 03/18/21 0350 03/20/21 0019  PROCALCITON  --   --   --  0.29  --   --   WBC  --  19.7*  --  9.7  --  12.9*  LATICACIDVEN 4.4*  --  3.2*  --  1.0  --     Microbiology Recent Results (from the past 240 hour(s))  Urine Culture     Status: Abnormal   Collection Time: 03/17/21  4:56 PM  Specimen: In/Out Cath Urine  Result Value Ref Range Status   Specimen Description IN/OUT CATH URINE  Final   Special Requests   Final    NONE Performed at [facility], [address], [place], [place] [address]    Culture 70,000 COLONIES/mL YEAST (A)  Final   Report Status 03/19/2021 FINAL  Final  Blood Culture (routine x 2)     Status: None (Preliminary result)   Collection Time: 03/17/21  5:14 PM   Specimen: BLOOD  Result Value Ref Range Status   Specimen Description BLOOD BLOOD LEFT HAND  Final   Special Requests   Final    BOTTLES DRAWN AEROBIC ONLY Blood Culture results may not be optimal due to an inadequate volume of blood received in culture bottles   Culture   Final    NO GROWTH 3 DAYS Performed at [facility], [place] [address], [place], [place] [address]    Report Status PENDING  Incomplete  Blood Culture (routine x 2)      Status: Abnormal (Preliminary result)   Collection Time: 03/17/21  5:30 PM   Specimen: BLOOD  Result Value Ref Range Status   Specimen Description BLOOD RIGHT PICC LINE  Final   Special Requests   Final    BOTTLES DRAWN AEROBIC AND ANAEROBIC Blood Culture adequate volume   Culture  Setup Time   Final    AEROBIC BOTTLE ONLY YEAST CRITICAL RESULT CALLED TO, READ BACK BY AND VERIFIED WITH: PHARMD JAMES LEDFORD 03/19/21'@6'$ :32 BY TW    Culture (A)  Final    CANDIDA ALBICANS CULTURE REINCUBATED FOR BETTER GROWTH Performed at [facility], [place] [address], [place], [place] [address]    Report Status PENDING  Incomplete  Blood Culture ID Panel (Reflexed)     Status: Abnormal   Collection Time: 03/17/21  5:30 PM  Result Value Ref Range Status   Enterococcus faecalis NOT DETECTED NOT DETECTED Final   Enterococcus Faecium NOT DETECTED NOT DETECTED Final   Listeria monocytogenes NOT DETECTED NOT DETECTED Final   Staphylococcus species NOT DETECTED NOT DETECTED Final   Staphylococcus aureus (BCID) NOT DETECTED NOT DETECTED Final   Staphylococcus epidermidis NOT DETECTED NOT DETECTED Final   Staphylococcus lugdunensis NOT DETECTED NOT DETECTED Final   Streptococcus species NOT DETECTED NOT DETECTED Final   Streptococcus agalactiae NOT DETECTED NOT DETECTED Final   Streptococcus pneumoniae NOT DETECTED NOT DETECTED Final   Streptococcus pyogenes NOT DETECTED NOT DETECTED Final   A.calcoaceticus-baumannii NOT DETECTED NOT DETECTED Final   Bacteroides fragilis NOT DETECTED NOT DETECTED Final   Enterobacterales NOT DETECTED NOT DETECTED Final   Enterobacter cloacae complex NOT DETECTED NOT DETECTED Final   Escherichia coli NOT DETECTED NOT DETECTED Final   Klebsiella aerogenes NOT DETECTED NOT DETECTED Final   Klebsiella oxytoca NOT DETECTED NOT DETECTED Final   Klebsiella pneumoniae NOT DETECTED NOT DETECTED Final   Proteus species NOT DETECTED NOT DETECTED Final   Salmonella species  NOT DETECTED NOT DETECTED Final   Serratia marcescens NOT DETECTED NOT DETECTED Final   Haemophilus influenzae NOT DETECTED NOT DETECTED Final   Neisseria meningitidis NOT DETECTED NOT DETECTED Final   Pseudomonas aeruginosa NOT DETECTED NOT DETECTED Final   Stenotrophomonas maltophilia NOT DETECTED NOT DETECTED Final   Candida albicans DETECTED (A) NOT DETECTED Final    Comment: CRITICAL RESULT CALLED TO, READ BACK BY AND VERIFIED WITH: PHARMD JAMES LEDFORD 03/19/21'@6'$ :32 BY TW    Candida auris NOT DETECTED NOT DETECTED  Final   Candida glabrata NOT DETECTED NOT DETECTED Final   Candida krusei NOT DETECTED NOT DETECTED Final   Candida parapsilosis NOT DETECTED NOT DETECTED Final   Candida tropicalis NOT DETECTED NOT DETECTED Final   Cryptococcus neoformans/gattii NOT DETECTED NOT DETECTED Final    Comment: Performed at [facility], [place] [address] [place], [place] [address]  Resp Panel by RT-PCR (Flu A&B, Covid) Nasopharyngeal Swab     Status: None   Collection Time: 03/17/21  6:22 PM   Specimen: Nasopharyngeal Swab; Nasopharyngeal(NP) swabs in vial transport medium  Result Value Ref Range Status   SARS Coronavirus 2 by RT PCR NEGATIVE NEGATIVE Final    Comment: (NOTE) SARS-CoV-2 target nucleic acids are NOT DETECTED.  The SARS-CoV-2 RNA is generally detectable in upper respiratory specimens during the acute phase of infection. The lowest concentration of SARS-CoV-2 viral copies this assay can detect is 138 copies/mL. A negative result does not preclude SARS-Cov-2 infection and should not be used as the sole basis for treatment or other patient management decisions. A negative result may occur with  improper specimen collection/handling, submission of specimen other than nasopharyngeal swab, presence of viral mutation(s) within the areas targeted by this assay, and inadequate number of viral copies(<138 copies/mL). A negative result must be combined with clinical observations,  patient history, and epidemiological information. The expected result is Negative.  Fact Sheet for Patients:  EntrepreneurPulse.com.au  Fact Sheet for Healthcare Providers:  IncredibleEmployment.be  This test is no t yet approved or cleared by the [place] FDA and  has been authorized for detection and/or diagnosis of SARS-CoV-2 by FDA under an Emergency Use Authorization (EUA). This EUA will remain  in effect (meaning this test can be used) for the duration of the COVID-19 declaration under Section 564(b)(1) of the Act, 21 U.S.C.section 360bbb-3(b)(1), unless the authorization is terminated  or revoked sooner.       Influenza A by PCR NEGATIVE NEGATIVE Final   Influenza B by PCR NEGATIVE NEGATIVE Final    Comment: (NOTE) The Xpert Xpress SARS-CoV-2/FLU/RSV plus assay is intended as an aid in the diagnosis of influenza from Nasopharyngeal swab specimens and should not be used as a sole basis for treatment. Nasal washings and aspirates are unacceptable for Xpert Xpress SARS-CoV-2/FLU/RSV testing.  Fact Sheet for Patients: EntrepreneurPulse.com.au  Fact Sheet for Healthcare Providers: IncredibleEmployment.be  This test is not yet approved or cleared by the [place] FDA and has been authorized for detection and/or diagnosis of SARS-CoV-2 by FDA under an Emergency Use Authorization (EUA). This EUA will remain in effect (meaning this test can be used) for the duration of the COVID-19 declaration under Section 564(b)(1) of the Act, 21 U.S.C. section 360bbb-3(b)(1), unless the authorization is terminated or revoked.  Performed at [facility], [place] [address] [place], [place] [address]   Culture, blood (routine x 2)     Status: None (Preliminary result)   Collection Time: 03/19/21 11:51 AM   Specimen: BLOOD  Result Value Ref Range Status   Specimen Description BLOOD SITE NOT SPECIFIED   Final   Special Requests   Final    BOTTLES DRAWN AEROBIC ONLY Blood Culture results may not be optimal due to an inadequate volume of blood received in culture bottles   Culture   Final    NO GROWTH < 24 HOURS Performed at [facility], [place] [address] [place], [place] [address]    Report Status PENDING  Incomplete  Culture,  blood (routine x 2)     Status: None (Preliminary result)   Collection Time: 03/19/21 11:51 AM   Specimen: BLOOD  Result Value Ref Range Status   Specimen Description BLOOD SITE NOT SPECIFIED  Final   Special Requests   Final    BOTTLES DRAWN AEROBIC ONLY Blood Culture results may not be optimal due to an inadequate volume of blood received in culture bottles   Culture   Final    NO GROWTH < 24 HOURS Performed at [facility], [address], [place], [place] [address]    Report Status PENDING  Incomplete    Procedures and diagnostic studies:  ECHOCARDIOGRAM COMPLETE  Result Date: 03/19/2021    ECHOCARDIOGRAM REPORT   Patient Name:   [name] [name] Date of Exam: 03/19/2021 Medical Rec #:  [id number]      Height:       74.0 in Accession #:    [id number]     Weight:       127.3 lb Date of Birth:  [birth year]     BSA:          1.794 m Patient Age:    29 years       BP:           127/85 mmHg Patient Gender: M              HR:           96 bpm. Exam Location:  Inpatient Procedure: 2D Echo Indications:    bacteremia  History:        Patient has prior history of Echocardiogram examinations, most                 recent 05/13/2020. HIV. Sepsis.  Sonographer:    Johny Chess RDCS Referring Phys: [place] Comments: Patient in pain, Image acquisition challenging due to uncooperative patient and Image acquisition challenging due to respiratory motion. IMPRESSIONS  1. The mitral valve leaflets are mildly thickened with no obvious vegetation, however, there is significant turbulent color flow across the mitral valve which may suggest significant  mitral regurgitation (vs vigorous pulmonary inflow). Mitral E velocity  only 0.81 which would argue against severe MR although subtoptimal interrogation given poor echo windows. Given the degree of color flow and concern for endocarditis, would recommend CMR or TEE to further evaluate if clinically indicated. No evidence of  mitral stenosis.  2. Left ventricular ejection fraction, by estimation, is 55 to 60%. The left ventricle has normal function. The left ventricle has no regional wall motion abnormalities. Left ventricular diastolic parameters were normal.  3. Right ventricular systolic function is normal. The right ventricular size is normal.  4. The aortic valve is tricuspid. There is mild thickening of the aortic valve. Aortic valve regurgitation is not visualized. Aortic valve sclerosis is present, with no evidence of aortic valve stenosis.  5. Aortic dilatation noted. There is mild dilatation of the aortic root, measuring 39 mm. Comparison(s): Compared to prior TTE in 05/2020, there is no significant turbulent flow across the mitral valve as detailed above with concern for possible signficant MR vs vigorous pulmonary vein inflow. Consider CMR vs TEE as above. FINDINGS  Left Ventricle: Left ventricular ejection fraction, by estimation, is 55 to 60%. The left ventricle has normal function. The left ventricle has no regional wall motion abnormalities. The left ventricular internal cavity size was normal in size. There is  no left ventricular hypertrophy. Left ventricular diastolic  parameters were normal. Right Ventricle: The right ventricular size is normal. No increase in right ventricular wall thickness. Right ventricular systolic function is normal. Left Atrium: Left atrial size was normal in size. Right Atrium: Right atrial size was normal in size. Pericardium: There is no evidence of pericardial effusion. Mitral Valve: The mitral valve leaflets are mildly thickened with no obvious vegetation, however, there  is significant turbulent color flow across the mitral valve which may suggest significant mitral regurgitation (vs vigorous pulmonary inflow). Mitral E velocity only 0.81 which would argue against severe MR although subtoptimal interrogation given poor echo windows. Given the degree of color flow and concern for endocarditis, would recommend CMR or TEE to further evaluate if clinically indicated. There is mild thickening of the mitral valve leaflet(s). No evidence of mitral valve stenosis. Tricuspid Valve: The tricuspid valve is normal in structure. Tricuspid valve regurgitation is trivial. Aortic Valve: The aortic valve is tricuspid. There is mild thickening of the aortic valve. Aortic valve regurgitation is not visualized. Aortic valve sclerosis is present, with no evidence of aortic valve stenosis. Pulmonic Valve: The pulmonic valve was not well visualized. Pulmonic valve regurgitation is trivial. Aorta: Aortic dilatation noted. There is mild dilatation of the aortic root, measuring 39 mm. IAS/Shunts: The atrial septum is grossly normal.  LEFT VENTRICLE PLAX 2D LVIDd:         5.00 cm   Diastology LVIDs:         3.70 cm   LV e' medial:    9.25 cm/s LV PW:         0.80 cm   LV E/e' medial:  8.9 LV IVS:        0.80 cm   LV e' lateral:   11.30 cm/s LVOT diam:     2.30 cm   LV E/e' lateral: 7.2 LV SV:         57 LV SV Index:   32 LVOT Area:     4.15 cm  RIGHT VENTRICLE             IVC RV S prime:     17.60 cm/s  IVC diam: 2.20 cm LEFT ATRIUM           Index        RIGHT ATRIUM           Index LA diam:      3.20 cm 1.78 cm/m   RA Area:     11.60 cm LA Vol (A4C): 39.9 ml 22.24 ml/m  RA Volume:   27.60 ml  15.39 ml/m  AORTIC VALVE LVOT Vmax:   93.40 cm/s LVOT Vmean:  60.200 cm/s LVOT VTI:    0.137 m  AORTA Ao Root diam: 3.90 cm MITRAL VALVE MV Area (PHT): 3.12 cm    SHUNTS MV Decel Time: 243 msec    Systemic VTI:  0.14 m MV E velocity: 81.90 cm/s  Systemic Diam: 2.30 cm MV A velocity: 67.00 cm/s MV E/A ratio:  1.22  Gwyndolyn Kaufman MD Electronically signed by Gwyndolyn Kaufman MD Signature Date/Time: 03/19/2021/4:22:43 PM    Final                LOS: 3 days   Trinette Vera  [facility]   Pager on [email or web address]. If 7PM-7AM, please contact night-coverage at [email or web address]     03/20/2021, 12:14 PM

## 2021-03-20 NOTE — Plan of Care (Signed)
  Problem: Health Behavior/Discharge Planning: Goal: Ability to manage health-related needs will improve Outcome: Progressing   Problem: Clinical Measurements: Goal: Will remain free from infection Outcome: Progressing   Problem: Clinical Measurements: Goal: Diagnostic test results will improve Outcome: Progressing   

## 2021-03-21 LAB — CBC WITH DIFFERENTIAL/PLATELET
Abs Immature Granulocytes: 0.06 10*3/uL (ref 0.00–0.07)
Basophils Absolute: 0.1 10*3/uL (ref 0.0–0.1)
Basophils Relative: 1 %
Eosinophils Absolute: 0.4 10*3/uL (ref 0.0–0.5)
Eosinophils Relative: 4 %
HCT: 27.3 % — ABNORMAL LOW (ref 39.0–52.0)
Hemoglobin: 8 g/dL — ABNORMAL LOW (ref 13.0–17.0)
Immature Granulocytes: 1 %
Lymphocytes Relative: 29 %
Lymphs Abs: 2.9 10*3/uL (ref 0.7–4.0)
MCH: 27.8 pg (ref 26.0–34.0)
MCHC: 29.3 g/dL — ABNORMAL LOW (ref 30.0–36.0)
MCV: 94.8 fL (ref 80.0–100.0)
Monocytes Absolute: 0.7 10*3/uL (ref 0.1–1.0)
Monocytes Relative: 7 %
Neutro Abs: 5.8 10*3/uL (ref 1.7–7.7)
Neutrophils Relative %: 58 %
Platelets: 297 10*3/uL (ref 150–400)
RBC: 2.88 MIL/uL — ABNORMAL LOW (ref 4.22–5.81)
RDW: 17.2 % — ABNORMAL HIGH (ref 11.5–15.5)
WBC: 9.9 10*3/uL (ref 4.0–10.5)
nRBC: 0 % (ref 0.0–0.2)

## 2021-03-21 LAB — BASIC METABOLIC PANEL
Anion gap: 10 (ref 5–15)
BUN: 38 mg/dL — ABNORMAL HIGH (ref 6–20)
CO2: 19 mmol/L — ABNORMAL LOW (ref 22–32)
Calcium: 10.1 mg/dL (ref 8.9–10.3)
Chloride: 116 mmol/L — ABNORMAL HIGH (ref 98–111)
Creatinine, Ser: 2.32 mg/dL — ABNORMAL HIGH (ref 0.61–1.24)
GFR, Estimated: 32 mL/min — ABNORMAL LOW (ref >60)
Glucose, Bld: 82 mg/dL (ref 70–99)
Potassium: 3.9 mmol/L (ref 3.5–5.1)
Sodium: 145 mmol/L (ref 135–145)

## 2021-03-21 LAB — MAGNESIUM: Magnesium: 1.6 mg/dL — ABNORMAL LOW (ref 1.7–2.4)

## 2021-03-21 MED ORDER — LACTATED RINGERS IV SOLN: INTRAVENOUS | Status: DC

## 2021-03-21 MED ORDER — FLUCONAZOLE 200 MG PO TABS
200.0000 mg | ORAL_TABLET | Freq: Every day | ORAL | Status: DC
  Administered 2021-03-23: 200 mg via ORAL
  Filled 2021-03-21 (×2): qty 1

## 2021-03-21 NOTE — Progress Notes (Signed)
PROGRESS NOTE    RIELLY CORLETT  OBS:[id number] DOB: 1960-12-01 DOA: 03/17/2021 PCP: [name], MD    Brief Narrative:  KRATOS RUSCITTI is a 61 year-old male with PMH significant for HIV/AIDS on chronic antiviral therapy, rectal cancer, sacral decubitus ulcers, chronic anemia with baseline hemoglobin 7-9, chronic urinary retention in the setting of urethral stricture with chronic indwelling Foley catheter, chronic diastolic heart failure, functional quadriplegia, CKD stage IIIA associated with baseline serum creatinine 1.4-1.7 admitted with suspected acute metabolic encephalopathy in the setting of severe sepsis.    Assessment & Plan:   Assessment and Plan: * Acute metabolic encephalopathy Patient presenting to the ED with 2-3 days of confusion relative to his baseline mental status.  Likely multifactorial in the setting of sepsis, dehydration, AKI on CKD stage IIIa, hypercalcemia and anion gap metabolic acidosis.  Ammonia level within normal limits.  Treated with antibiotics, IV fluid hydration with resolution of encephalopathy, now back to his baseline mental status.   --Continue treatment as below    Severe sepsis; Candidia albicans bacteremia ([place]) He presented with 2 to 3 days of confusion, leukocytosis, tachycardia, tachypnea, lactic acid initially 4.4 which resolved with IV hydration.  Acute metabolic encephalopathy which now resolved. Chest X-ray no evidence of acute cardiopulmonary process.   Procalcitonin 0.29.  COVID/influenza a/B PCR negative. UA significant pyuria in the context of chronic indwelling Foley catheter. In terms of source of suspected underlying infection, possibilities include infected left lateral foot wound as well as infected sacral decubitus ulcer.  --Infectious disease following, appreciate assistance --TTE with thickened MV leaflets, LV EF 55-60%, significant MR. --TEE: Pending 3/14 --Blood Cx 3/9: + CANDIDA ALBICANS; susceptibilities  pending --Repeat blood cultures 3/11: No growth x2 days --Fluconazole 200 mg IV q24h --Will need outpatient ophthalmology evaluation for endophthalmitis    Acute renal failure superimposed on stage 3a chronic kidney disease (HCC) Baseline serum creatinine 1.4-1.7.  Creatinine on admission 2.96. Likely prerenal in the setting of sepsis.   --Cr 2.96>>2.32 --Continue LR at 100 mL/hr --Avoid nephrotoxic medications, renally dose all medications --BMP daily   Human immunodeficiency virus (HIV) disease (HCC) CD4 count 487. Follows with ID outpatient, [name]. --Continue Tivicay, Prezcobix, and Endurant for HIV ART     Chronic diastolic CHF (congestive heart failure) ([place]) TTE with LVEF 55-60%, significant MR. --TEE: Pending as above --Strict I's and O's Daily weights      Normochromic normocytic anemia This could be secondary to HIV, underlying malignancy. Baseline Hb 7-9.   Hemoglobin 5.9 on arrival. FOBT negative. Transfused 2 unit PRBC on 03/18/2021 --Hgb  8.3>>5.9>5.8>7.7>8.2>8.0 --CBC q3d  High anion gap metabolic acidosis Presented with high anion gap metabolic acidosis.   Likely multifactorial,  contribution from lactic acidosis, AKI on CKD stage IIIa Improved with IV hydration.          Hypercalcemia Calcium 10.7 on admission, likely secondary to severe dehydration resolved with IV fluid hydration.  Pressure injury of skin Patient with known sacral 5.5 cm x 5 cm x 0.4 cm decubitus ulcer and left foot with 7 cm x 7 cm fifth digit plantar/lateral foot with full thickness wound and maceration.  -- Evaluated by wound care RN on 3/10 --Topical wound care guidance is with calcium alginate to the left foot changed daily with offloading, the sacral wound will require a silver hydrofiber with daily dressing changes.    Protein-calorie malnutrition, severe ([place]) Notable in the context of HIV AIDS. Body mass index is 16.34 kg/m. Nutrition Status:  Nutrition Problem:  Severe Malnutrition Etiology: chronic illness (HIV/AIDS) Signs/Symptoms: severe fat depletion, severe muscle depletion Interventions: Ensure Enlive (each supplement provides 350kcal and 20 grams of protein) -- Dietitian following, encourage increase oral intake, supplementation      DVT prophylaxis: apixaban (ELIQUIS) tablet 2.5 mg Start: 03/17/21 2330 SCDs Start: 03/17/21 2143 apixaban (ELIQUIS) tablet 2.5 mg    Code Status: DNR Family Communication: No family present at bedside this morning  Disposition Plan:  Level of care: Progressive Status is: Inpatient Remains inpatient appropriate because: Continues on IV antibiotics, awaiting culture susceptibilities, awaiting further recommendations per ID, pending TEE tomorrow    Consultants:  Infectious disease Cardiology for TEE  Procedures:  TTE TEE: Pending  Antimicrobials:  Fluconazole 3/11>> Vancomycin 3/9 - 3/10 Cefepime 3/9 - 3/10 Metronidazole 3/9 - 3/10 Daptomycin 3/10 - 3/12   Subjective: Seen and examined at bedside, resting comfortably.  No specific complaints this morning.  Continues on IV antibiotics.  Discussed with patient need for TEE given concerning findings on TTE with thickened mitral valve; cardiology plans to perform tomorrow.  Denies headache, no dizziness, no chest pain, palpitations, no shortness of breath, no abdominal pain.  Objective: Vitals:   03/20/21 0408 03/20/21 2229 03/21/21 0747 03/21/21 1232  BP: 133/84 131/84 (!) 145/95 (!) 141/92  Pulse: 89 92 93 93  Resp: '18 18 20 20  '$ Temp: 98.3 F (36.8 C) 98.1 F (36.7 C) 97.7 F (36.5 C) 97.8 F (36.6 C)  TempSrc: Oral  Oral Oral  SpO2: 100% 100% 100% 100%  Weight:      Height:        Intake/Output Summary (Last 24 hours) at 03/21/2021 1504 Last data filed at 03/21/2021 1253 Gross per 24 hour  Intake --  Output 2000 ml  Net -2000 ml   Filed Weights   03/18/21 0112  Weight: 57.7 kg    Examination:  Physical Exam: GEN: NAD,  alert and oriented x 3, thin/chronically ill/cachectic in appearance, appears older than stated age HEENT: NCAT, PERRL, EOMI, sclera clear, MMM PULM: CTAB w/o wheezes/crackles, normal respiratory effort, on room air CV: RRR w/o M/G/R GI: abd soft, NTND, NABS, no R/G/M MSK: no peripheral edema NEURO: Alert and oriented to person/place, but not time or situation PSYCH: normal mood/affect Integumentary: Stage IV sacral decubitus ulcer without active bleeding, left foot wound noted    Data Reviewed: I have personally reviewed following labs and imaging studies  CBC: Recent Labs  Lab 03/17/21 1735 03/17/21 1755 03/18/21 0253 03/18/21 0613 03/18/21 1852 03/20/21 0019 03/21/21 0313  WBC 19.7*  --  9.7  --   --  12.9* 9.9  NEUTROABS 12.5*  --  6.4  --   --  9.6* 5.8  HGB 8.3*   < > 5.9* 5.8* 7.7* 8.2* 8.0*  HCT 30.0*   < > 20.3* 19.4* 23.8* 27.5* 27.3*  MCV 99.3  --  94.4  --   --  94.8 94.8  PLT 551*  --  338  --   --  296 297   < > = values in this interval not displayed.   Basic Metabolic Panel: Recent Labs  Lab 03/17/21 1735 03/17/21 1755 03/18/21 0253 03/19/21 0140 03/20/21 0019 03/21/21 0313  NA 139 142 138 138 142 145  K 3.7 3.8 3.2* 3.4* 4.2 3.9  CL 109  --  110 113* 116* 116*  CO2 13*  --  16* 15* 18* 19*  GLUCOSE 155*  --  85 93 138* 82  BUN  52*  --  46* 45* 45* 38*  CREATININE 2.96*  --  2.41* 2.83* 2.94* 2.32*  CALCIUM 10.7*  --  9.3 9.2 9.8 10.1  MG  --   --  1.6*   1.5* 1.9 1.9 1.6*  PHOS  --   --  3.9  --   --   --    GFR: Estimated Creatinine Clearance: 28 mL/min (A) (by C-G formula based on SCr of 2.32 mg/dL (H)). Liver Function Tests: Recent Labs  Lab 03/17/21 1735 03/18/21 0253  AST 23 15  ALT 19 14  ALKPHOS 77 55  BILITOT 0.3 0.4  PROT 9.1* 6.8  ALBUMIN 2.7* 2.0*   No results for input(s): LIPASE, AMYLASE in the last 168 hours. Recent Labs  Lab 03/17/21 1743  AMMONIA <10   Coagulation Profile: Recent Labs  Lab 03/17/21 1735  INR  1.8*   Cardiac Enzymes: Recent Labs  Lab 03/18/21 0253  CKTOTAL 27*   BNP (last 3 results) No results for input(s): PROBNP in the last 8760 hours. HbA1C: No results for input(s): HGBA1C in the last 72 hours. CBG: No results for input(s): GLUCAP in the last 168 hours. Lipid Profile: No results for input(s): CHOL, HDL, LDLCALC, TRIG, CHOLHDL, LDLDIRECT in the last 72 hours. Thyroid Function Tests: No results for input(s): TSH, T4TOTAL, FREET4, T3FREE, THYROIDAB in the last 72 hours. Anemia Panel: No results for input(s): VITAMINB12, FOLATE, FERRITIN, TIBC, IRON, RETICCTPCT in the last 72 hours. Sepsis Labs: Recent Labs  Lab 03/17/21 1714 03/17/21 1856 03/18/21 0253 03/18/21 0350  PROCALCITON  --   --  0.29  --   LATICACIDVEN 4.4* 3.2*  --  1.0    Recent Results (from the past 240 hour(s))  Urine Culture     Status: Abnormal   Collection Time: 03/17/21  4:56 PM   Specimen: In/Out Cath Urine  Result Value Ref Range Status   Specimen Description IN/OUT CATH URINE  Final   Special Requests   Final    NONE Performed at [facility], [address], [place], [place] [address]    Culture 70,000 COLONIES/mL YEAST (A)  Final   Report Status 03/19/2021 FINAL  Final  Blood Culture (routine x 2)     Status: None (Preliminary result)   Collection Time: 03/17/21  5:14 PM   Specimen: BLOOD  Result Value Ref Range Status   Specimen Description BLOOD BLOOD LEFT HAND  Final   Special Requests   Final    BOTTLES DRAWN AEROBIC ONLY Blood Culture results may not be optimal due to an inadequate volume of blood received in culture bottles   Culture   Final    NO GROWTH 4 DAYS Performed at [facility], [place] [address], [place], [place] [address]    Report Status PENDING  Incomplete  Blood Culture (routine x 2)     Status: Abnormal (Preliminary result)   Collection Time: 03/17/21  5:30 PM   Specimen: BLOOD  Result Value Ref Range Status   Specimen Description BLOOD RIGHT  PICC LINE  Final   Special Requests   Final    BOTTLES DRAWN AEROBIC AND ANAEROBIC Blood Culture adequate volume   Culture  Setup Time   Final    AEROBIC BOTTLE ONLY YEAST CRITICAL RESULT CALLED TO, READ BACK BY AND VERIFIED WITH: PHARMD JAMES LEDFORD 03/19/21'@6'$ :45 BY TW    Culture (A)  Final    CANDIDA ALBICANS Sent to [place] for further susceptibility testing. Performed at  [facility], [place] [address] [place], [place] [address]    Report Status PENDING  Incomplete  Blood Culture ID Panel (Reflexed)     Status: Abnormal   Collection Time: 03/17/21  5:30 PM  Result Value Ref Range Status   Enterococcus faecalis NOT DETECTED NOT DETECTED Final   Enterococcus Faecium NOT DETECTED NOT DETECTED Final   Listeria monocytogenes NOT DETECTED NOT DETECTED Final   Staphylococcus species NOT DETECTED NOT DETECTED Final   Staphylococcus aureus (BCID) NOT DETECTED NOT DETECTED Final   Staphylococcus epidermidis NOT DETECTED NOT DETECTED Final   Staphylococcus lugdunensis NOT DETECTED NOT DETECTED Final   Streptococcus species NOT DETECTED NOT DETECTED Final   Streptococcus agalactiae NOT DETECTED NOT DETECTED Final   Streptococcus pneumoniae NOT DETECTED NOT DETECTED Final   Streptococcus pyogenes NOT DETECTED NOT DETECTED Final   A.calcoaceticus-baumannii NOT DETECTED NOT DETECTED Final   Bacteroides fragilis NOT DETECTED NOT DETECTED Final   Enterobacterales NOT DETECTED NOT DETECTED Final   Enterobacter cloacae complex NOT DETECTED NOT DETECTED Final   Escherichia coli NOT DETECTED NOT DETECTED Final   Klebsiella aerogenes NOT DETECTED NOT DETECTED Final   Klebsiella oxytoca NOT DETECTED NOT DETECTED Final   Klebsiella pneumoniae NOT DETECTED NOT DETECTED Final   Proteus species NOT DETECTED NOT DETECTED Final   Salmonella species NOT DETECTED NOT DETECTED Final   Serratia marcescens NOT DETECTED NOT DETECTED Final   Haemophilus influenzae NOT DETECTED NOT DETECTED Final    Neisseria meningitidis NOT DETECTED NOT DETECTED Final   Pseudomonas aeruginosa NOT DETECTED NOT DETECTED Final   Stenotrophomonas maltophilia NOT DETECTED NOT DETECTED Final   Candida albicans DETECTED (A) NOT DETECTED Final    Comment: CRITICAL RESULT CALLED TO, READ BACK BY AND VERIFIED WITH: PHARMD JAMES LEDFORD 03/19/21'@6'$ :32 BY TW    Candida auris NOT DETECTED NOT DETECTED Final   Candida glabrata NOT DETECTED NOT DETECTED Final   Candida krusei NOT DETECTED NOT DETECTED Final   Candida parapsilosis NOT DETECTED NOT DETECTED Final   Candida tropicalis NOT DETECTED NOT DETECTED Final   Cryptococcus neoformans/gattii NOT DETECTED NOT DETECTED Final    Comment: Performed at [facility], [place] [address] [place], [place] [address]  Resp Panel by RT-PCR (Flu A&B, Covid) Nasopharyngeal Swab     Status: None   Collection Time: 03/17/21  6:22 PM   Specimen: Nasopharyngeal Swab; Nasopharyngeal(NP) swabs in vial transport medium  Result Value Ref Range Status   SARS Coronavirus 2 by RT PCR NEGATIVE NEGATIVE Final    Comment: (NOTE) SARS-CoV-2 target nucleic acids are NOT DETECTED.  The SARS-CoV-2 RNA is generally detectable in upper respiratory specimens during the acute phase of infection. The lowest concentration of SARS-CoV-2 viral copies this assay can detect is 138 copies/mL. A negative result does not preclude SARS-Cov-2 infection and should not be used as the sole basis for treatment or other patient management decisions. A negative result may occur with  improper specimen collection/handling, submission of specimen other than nasopharyngeal swab, presence of viral mutation(s) within the areas targeted by this assay, and inadequate number of viral copies(<138 copies/mL). A negative result must be combined with clinical observations, patient history, and epidemiological information. The expected result is Negative.  Fact Sheet for Patients:   EntrepreneurPulse.com.au  Fact Sheet for Healthcare Providers:  IncredibleEmployment.be  This test is no t yet approved or cleared by the [place] FDA and  has been authorized for detection and/or diagnosis of SARS-CoV-2 by FDA under an  Emergency Use Authorization (EUA). This EUA will remain  in effect (meaning this test can be used) for the duration of the COVID-19 declaration under Section 564(b)(1) of the Act, 21 U.S.C.section 360bbb-3(b)(1), unless the authorization is terminated  or revoked sooner.       Influenza A by PCR NEGATIVE NEGATIVE Final   Influenza B by PCR NEGATIVE NEGATIVE Final    Comment: (NOTE) The Xpert Xpress SARS-CoV-2/FLU/RSV plus assay is intended as an aid in the diagnosis of influenza from Nasopharyngeal swab specimens and should not be used as a sole basis for treatment. Nasal washings and aspirates are unacceptable for Xpert Xpress SARS-CoV-2/FLU/RSV testing.  Fact Sheet for Patients: EntrepreneurPulse.com.au  Fact Sheet for Healthcare Providers: IncredibleEmployment.be  This test is not yet approved or cleared by the [place] FDA and has been authorized for detection and/or diagnosis of SARS-CoV-2 by FDA under an Emergency Use Authorization (EUA). This EUA will remain in effect (meaning this test can be used) for the duration of the COVID-19 declaration under Section 564(b)(1) of the Act, 21 U.S.C. section 360bbb-3(b)(1), unless the authorization is terminated or revoked.  Performed at [facility], [place] [address] [place], [place] [address]   Culture, blood (routine x 2)     Status: None (Preliminary result)   Collection Time: 03/19/21 11:51 AM   Specimen: BLOOD  Result Value Ref Range Status   Specimen Description BLOOD SITE NOT SPECIFIED  Final   Special Requests   Final    BOTTLES DRAWN AEROBIC ONLY Blood Culture results may not be optimal due to an  inadequate volume of blood received in culture bottles   Culture   Final    NO GROWTH 2 DAYS Performed at [facility], [place] [address] [place], [place] [address]    Report Status PENDING  Incomplete  Culture, blood (routine x 2)     Status: None (Preliminary result)   Collection Time: 03/19/21 11:51 AM   Specimen: BLOOD  Result Value Ref Range Status   Specimen Description BLOOD SITE NOT SPECIFIED  Final   Special Requests   Final    BOTTLES DRAWN AEROBIC ONLY Blood Culture results may not be optimal due to an inadequate volume of blood received in culture bottles   Culture   Final    NO GROWTH 2 DAYS Performed at [facility], [place] [address], [place], [place] [address]    Report Status PENDING  Incomplete         Radiology Studies: No results found.      Scheduled Meds:  apixaban  2.5 mg Oral BID   Chlorhexidine Gluconate Cloth  6 each Topical Daily   darunavir-cobicistat  1 tablet Oral Daily   dolutegravir  50 mg Oral Daily   famotidine  10 mg Oral Daily   feeding supplement  237 mL Oral TID BM   [START ON 03/22/2021] fluconazole  200 mg Oral Daily   rilpivirine  25 mg Oral Q breakfast   Continuous Infusions:  lactated ringers 100 mL/hr at 03/21/21 0838     LOS: 4 days    Time spent: 46 minutes spent on chart review, discussion with nursing staff, consultants, updating family and interview/physical exam; more than 50% of that time was spent in counseling and/or coordination of care.    Haille Pardi J [place] DO [facility] Available via Epic secure chat 7am-7pm After these hours, please refer to coverage provider listed on [email or web address] 03/21/2021, 3:04 PM

## 2021-03-21 NOTE — Plan of Care (Signed)
  Problem: Clinical Measurements: Goal: Ability to maintain clinical measurements within normal limits will improve Outcome: Progressing Goal: Cardiovascular complication will be avoided Outcome: Progressing   

## 2021-03-21 NOTE — Progress Notes (Signed)
? ? ?  [facility] has been requested to perform a transesophageal echocardiogram on [name] [name] for abnormal echocardiogram findings that could be consistent with endocarditis.  ? ?Patient has a past medical history of HIV/AIDS on chronic antiviral therapy, rectal cancer, sacral decubitus ulcer, chronic anemia, chronic urinary retention in the setting of urethral stricture with chronic indwelling foley catheters, chronic diastolic heart failure, functional quadriplegia, CKD stage IIIa. Patient was admitted on 03/17/21 with suspected acute metabolic encephalopathy in the setting of severe sepsis. One blood culture was positive for candida albicans, the other 3 blood cultures show no growth to date. Echocardiogram on 03/19/21 showed mildly thickened mitral valve leaflets without obvious vegetation, however there is significant turbulent flow across the mitral valve which may suggest significant mitral regurgitation. Because of these abnormal findings, Infectious Disease would like to proceed with a TEE to rule out endocarditis. ? ?When discussing procedure with the patient, he continued to have altered mental status and I believe that he is unable to consent to the TEE at this time. Discussed with RN who also believed patient could not consent. Since patient could not consent for himself, I discussed the TEE procedure, risks, and benefits with patient's sister [name].  ? ?After careful review of history and examination, the risks and benefits of transesophageal echocardiogram have been explained including risks of esophageal damage, perforation (1:10,000 risk), bleeding, pharyngeal hematoma as well as other potential complications associated with conscious sedation including aspiration, arrhythmia, respiratory failure and death. Alternatives to treatment were discussed, questions were answered. As patient was confused/altered and unable to consent, his sister [name] consented in  his place over the phone. Consent confirmed by Dr. Burt Knack.  ? ?Margie Billet, PA-C ?03/21/2021 4:08 PM  ? ?

## 2021-03-21 NOTE — Care Management Important Message (Signed)
Important Message ? ?Patient Details  ?Name: [name] [name] ?MRN: [id number] ?Date of Birth: July 16, 1960 ? ? ?Medicare Important Message Given:  Yes ? ? ? ? ?[name] [name] ?03/21/2021, 3:29 PM ?

## 2021-03-21 NOTE — TOC Progression Note (Signed)
Transition of Care (TOC) - Progression Note  ? ? ?Patient Details  ?Name: [name] [name] ?MRN: [id number] ?Date of Birth: [birth year] ? ?Transition of Care (TOC) CM/SW Contact  ?Pollie Friar, RN ?Phone Number: ?03/21/2021, 11:52 AM ? ?Clinical Narrative:    ?Patient's family asking about [facility] at discharge. CM asked the liaison to review to see if patient would qualify and pt currently does not qualify for a LTACH stay. Sister, [name] updated.  ?[name] in agreement for patient to return to [facility] at d/c.  ?TOC following. ? ? ?Expected Discharge Plan: [place] ?Barriers to Discharge: Continued Medical Work up ? ?Expected Discharge Plan and Services ?Expected Discharge Plan: [place] ?  ?  ?Post Acute Care Choice: [place] ?Living arrangements for the past 2 months: [place] ?                ?  ?  ?  ?  ?  ?  ?  ?  ?  ?  ? ? ?Social Determinants of Health (SDOH) Interventions ?  ? ?Readmission Risk Interventions ?Readmission Risk Prevention Plan 12/23/2020 05/28/2020  ?Transportation Screening Complete Complete  ?PCP or Specialist Appt within 3-5 Days - Complete  ?[place] or Home Care Consult - Complete  ?Social Work Consult for [place] Planning/Counseling - Complete  ?Palliative Care Screening - Complete  ?Medication Review Press photographer) Complete Complete  ?PCP or Specialist appointment within 3-5 days of discharge Complete -  ?[place] or Home Care Consult Complete -  ?SW Recovery Care/Counseling Consult Complete -  ?Palliative Care Screening Not Applicable -  ?Skilled Nursing Facility Complete -  ?Some recent data might be hidden  ? ? ?

## 2021-03-21 NOTE — Assessment & Plan Note (Signed)
Patient with known sacral 5.5 cm x 5 cm x 0.4 cm decubitus ulcer and left foot with 7 cm x 7 cm fifth digit plantar/lateral foot with full thickness wound and maceration.  -- Evaluated by wound care RN on 3/10 --Topical wound care guidance is with calcium alginate to the left foot changed daily with offloading, the sacral wound will require a silver hydrofiber with daily dressing changes.

## 2021-03-21 NOTE — Progress Notes (Signed)
[facility]  Date of Admission:  03/17/2021     Total days of antibiotics 5         ASSESSMENT:  [name] [name] remains confused. Blood cultures from 03/19/21 remain without growth. Appears to be tolerating fluconazole with no adverse side effects. Recommend TEE to rule out endocarditis given TEE findings. Will need outpatient follow up with ophthalmology to check for endophthalmitis.  Leukocytosis improved. Continue current dose of fluconazole for fungemia and Tivicay, Prezcobix and Endurant for HIV. Monitor cultures for clearance of fungemia. Remaining medical and supportive care per primary team.   PLAN:  Continue fluconzole. Monitor cultures for clearance of fungemia.  Continue Tivicay, Prezcobix, and Endurant for HIV ART.  TEE to check for endocarditis. Outpatient follow up with Ophthalmology to check for endophthalmitis.  Remaining medical and supportive care per primary team. a  Principal Problem:   Acute metabolic encephalopathy Active Problems:   Human immunodeficiency virus (HIV) disease ([place])   Protein-calorie malnutrition, severe ([place])   Acute renal failure superimposed on stage 3a chronic kidney disease (HCC)   Severe sepsis (HCC)   Chronic diastolic CHF (congestive heart failure) (HCC)   Normochromic normocytic anemia   High anion gap metabolic acidosis   Hypercalcemia    apixaban  2.5 mg Oral BID   Chlorhexidine Gluconate Cloth  6 each Topical Daily   darunavir-cobicistat  1 tablet Oral Daily   dolutegravir  50 mg Oral Daily   famotidine  10 mg Oral Daily   feeding supplement  237 mL Oral TID BM   rilpivirine  25 mg Oral Q breakfast    SUBJECTIVE:  Afebrile overnight with no acute events. Remains confused - asking about someone making changes to his medical care.   Allergies  Allergen Reactions   Collagen Rash    redness     Review of Systems: Review of Systems  Constitutional:  Negative for chills, fever and weight loss.   Respiratory:  Negative for cough, shortness of breath and wheezing.   Cardiovascular:  Negative for chest pain and leg swelling.  Gastrointestinal:  Negative for abdominal pain, constipation, diarrhea, nausea and vomiting.  Skin:  Negative for rash.     OBJECTIVE: Vitals:   03/19/21 2138 03/20/21 0408 03/20/21 2229 03/21/21 0747  BP: 135/81 133/84 131/84 (!) 145/95  Pulse: 92 89 92 93  Resp: '18 18 18 20  '$ Temp: 98.3 F (36.8 C) 98.3 F (36.8 C) 98.1 F (36.7 C) 97.7 F (36.5 C)  TempSrc: Oral Oral  Oral  SpO2: 100% 100% 100% 100%  Weight:      Height:       Body mass index is 16.34 kg/m.  Physical Exam Constitutional:      General: He is not in acute distress.    Appearance: He is well-developed.     Comments: Lying in bed with head of bed elevated; confused.  Cardiovascular:     Rate and Rhythm: Normal rate and regular rhythm.     Heart sounds: Normal heart sounds.  Pulmonary:     Effort: Pulmonary effort is normal.     Breath sounds: Normal breath sounds.  Skin:    General: Skin is warm and dry.  Neurological:     Mental Status: He is alert and oriented to person, place, and time.    Lab Results Lab Results  Component Value Date   WBC 9.9 03/21/2021   HGB 8.0 (L) 03/21/2021   HCT 27.3 (L) 03/21/2021  MCV 94.8 03/21/2021   PLT 297 03/21/2021    Lab Results  Component Value Date   CREATININE 2.32 (H) 03/21/2021   BUN 38 (H) 03/21/2021   NA 145 03/21/2021   K 3.9 03/21/2021   CL 116 (H) 03/21/2021   CO2 19 (L) 03/21/2021    Lab Results  Component Value Date   ALT 14 03/18/2021   AST 15 03/18/2021   ALKPHOS 55 03/18/2021   BILITOT 0.4 03/18/2021     Microbiology: Recent Results (from the past 240 hour(s))  Urine Culture     Status: Abnormal   Collection Time: 03/17/21  4:56 PM   Specimen: In/Out Cath Urine  Result Value Ref Range Status   Specimen Description IN/OUT CATH URINE  Final   Special Requests   Final    NONE Performed at [facility], [address] [place], [place] [address]    Culture 70,000 COLONIES/mL YEAST (A)  Final   Report Status 03/19/2021 FINAL  Final  Blood Culture (routine x 2)     Status: None (Preliminary result)   Collection Time: 03/17/21  5:14 PM   Specimen: BLOOD  Result Value Ref Range Status   Specimen Description BLOOD BLOOD LEFT HAND  Final   Special Requests   Final    BOTTLES DRAWN AEROBIC ONLY Blood Culture results may not be optimal due to an inadequate volume of blood received in culture bottles   Culture   Final    NO GROWTH 4 DAYS Performed at [facility], [place] [address], [place], [place] [address]    Report Status PENDING  Incomplete  Blood Culture (routine x 2)     Status: Abnormal (Preliminary result)   Collection Time: 03/17/21  5:30 PM   Specimen: BLOOD  Result Value Ref Range Status   Specimen Description BLOOD RIGHT PICC LINE  Final   Special Requests   Final    BOTTLES DRAWN AEROBIC AND ANAEROBIC Blood Culture adequate volume   Culture  Setup Time   Final    AEROBIC BOTTLE ONLY YEAST CRITICAL RESULT CALLED TO, READ BACK BY AND VERIFIED WITH: PHARMD JAMES LEDFORD 03/19/21'@6'$ :32 BY TW    Culture (A)  Final    CANDIDA ALBICANS Sent to [facility] for further susceptibility testing. Performed at [facility], [place] [address] [place], [place] [address]    Report Status PENDING  Incomplete  Blood Culture ID Panel (Reflexed)     Status: Abnormal   Collection Time: 03/17/21  5:30 PM  Result Value Ref Range Status   Enterococcus faecalis NOT DETECTED NOT DETECTED Final   Enterococcus Faecium NOT DETECTED NOT DETECTED Final   Listeria monocytogenes NOT DETECTED NOT DETECTED Final   Staphylococcus species NOT DETECTED NOT DETECTED Final   Staphylococcus aureus (BCID) NOT DETECTED NOT DETECTED Final   Staphylococcus epidermidis NOT DETECTED NOT DETECTED Final   Staphylococcus lugdunensis NOT DETECTED NOT DETECTED Final   Streptococcus species NOT DETECTED  NOT DETECTED Final   Streptococcus agalactiae NOT DETECTED NOT DETECTED Final   Streptococcus pneumoniae NOT DETECTED NOT DETECTED Final   Streptococcus pyogenes NOT DETECTED NOT DETECTED Final   A.calcoaceticus-baumannii NOT DETECTED NOT DETECTED Final   Bacteroides fragilis NOT DETECTED NOT DETECTED Final   Enterobacterales NOT DETECTED NOT DETECTED Final   Enterobacter cloacae complex NOT DETECTED NOT DETECTED Final   Escherichia coli NOT DETECTED NOT DETECTED Final   Klebsiella aerogenes NOT DETECTED NOT DETECTED Final   Klebsiella oxytoca NOT DETECTED  NOT DETECTED Final   Klebsiella pneumoniae NOT DETECTED NOT DETECTED Final   Proteus species NOT DETECTED NOT DETECTED Final   Salmonella species NOT DETECTED NOT DETECTED Final   Serratia marcescens NOT DETECTED NOT DETECTED Final   Haemophilus influenzae NOT DETECTED NOT DETECTED Final   Neisseria meningitidis NOT DETECTED NOT DETECTED Final   Pseudomonas aeruginosa NOT DETECTED NOT DETECTED Final   Stenotrophomonas maltophilia NOT DETECTED NOT DETECTED Final   Candida albicans DETECTED (A) NOT DETECTED Final    Comment: CRITICAL RESULT CALLED TO, READ BACK BY AND VERIFIED WITH: PHARMD JAMES LEDFORD 03/19/21'@6'$ :32 BY TW    Candida auris NOT DETECTED NOT DETECTED Final   Candida glabrata NOT DETECTED NOT DETECTED Final   Candida krusei NOT DETECTED NOT DETECTED Final   Candida parapsilosis NOT DETECTED NOT DETECTED Final   Candida tropicalis NOT DETECTED NOT DETECTED Final   Cryptococcus neoformans/gattii NOT DETECTED NOT DETECTED Final    Comment: Performed at [facility] [address], [place], [place] [address]  Resp Panel by RT-PCR (Flu A&B, Covid) Nasopharyngeal Swab     Status: None   Collection Time: 03/17/21  6:22 PM   Specimen: Nasopharyngeal Swab; Nasopharyngeal(NP) swabs in vial transport medium  Result Value Ref Range Status   SARS Coronavirus 2 by RT PCR NEGATIVE NEGATIVE Final    Comment: (NOTE) SARS-CoV-2  target nucleic acids are NOT DETECTED.  The SARS-CoV-2 RNA is generally detectable in upper respiratory specimens during the acute phase of infection. The lowest concentration of SARS-CoV-2 viral copies this assay can detect is 138 copies/mL. A negative result does not preclude SARS-Cov-2 infection and should not be used as the sole basis for treatment or other patient management decisions. A negative result may occur with  improper specimen collection/handling, submission of specimen other than nasopharyngeal swab, presence of viral mutation(s) within the areas targeted by this assay, and inadequate number of viral copies(<138 copies/mL). A negative result must be combined with clinical observations, patient history, and epidemiological information. The expected result is Negative.  Fact Sheet for Patients:  EntrepreneurPulse.com.au  Fact Sheet for Healthcare Providers:  IncredibleEmployment.be  This test is no t yet approved or cleared by the [place] FDA and  has been authorized for detection and/or diagnosis of SARS-CoV-2 by FDA under an Emergency Use Authorization (EUA). This EUA will remain  in effect (meaning this test can be used) for the duration of the COVID-19 declaration under Section 564(b)(1) of the Act, 21 U.S.C.section 360bbb-3(b)(1), unless the authorization is terminated  or revoked sooner.       Influenza A by PCR NEGATIVE NEGATIVE Final   Influenza B by PCR NEGATIVE NEGATIVE Final    Comment: (NOTE) The Xpert Xpress SARS-CoV-2/FLU/RSV plus assay is intended as an aid in the diagnosis of influenza from Nasopharyngeal swab specimens and should not be used as a sole basis for treatment. Nasal washings and aspirates are unacceptable for Xpert Xpress SARS-CoV-2/FLU/RSV testing.  Fact Sheet for Patients: EntrepreneurPulse.com.au  Fact Sheet for Healthcare  Providers: IncredibleEmployment.be  This test is not yet approved or cleared by the [place] FDA and has been authorized for detection and/or diagnosis of SARS-CoV-2 by FDA under an Emergency Use Authorization (EUA). This EUA will remain in effect (meaning this test can be used) for the duration of the COVID-19 declaration under Section 564(b)(1) of the Act, 21 U.S.C. section 360bbb-3(b)(1), unless the authorization is terminated or revoked.  Performed at [facility], [place],  [place] [address]   Culture, blood (routine x 2)     Status: None (Preliminary result)   Collection Time: 03/19/21 11:51 AM   Specimen: BLOOD  Result Value Ref Range Status   Specimen Description BLOOD SITE NOT SPECIFIED  Final   Special Requests   Final    BOTTLES DRAWN AEROBIC ONLY Blood Culture results may not be optimal due to an inadequate volume of blood received in culture bottles   Culture   Final    NO GROWTH 2 DAYS Performed at [facility], [place] [address], [place], [place] [address]    Report Status PENDING  Incomplete  Culture, blood (routine x 2)     Status: None (Preliminary result)   Collection Time: 03/19/21 11:51 AM   Specimen: BLOOD  Result Value Ref Range Status   Specimen Description BLOOD SITE NOT SPECIFIED  Final   Special Requests   Final    BOTTLES DRAWN AEROBIC ONLY Blood Culture results may not be optimal due to an inadequate volume of blood received in culture bottles   Culture   Final    NO GROWTH 2 DAYS Performed at [facility], [place] [address], [place], [place] [address]    Report Status PENDING  Incomplete     Terri Piedra, NP [facility]  03/21/2021  10:38 AM

## 2021-03-22 ENCOUNTER — Encounter (HOSPITAL_COMMUNITY)
Admission: EM | Disposition: A | Discharge status: Skilled Nursing Facility | Payer: Self-pay | Source: Home / Self Care | Attending: Internal Medicine

## 2021-03-22 ENCOUNTER — Inpatient Hospital Stay (HOSPITAL_COMMUNITY): Payer: Medicare Other

## 2021-03-22 ENCOUNTER — Inpatient Hospital Stay (HOSPITAL_COMMUNITY): Payer: Medicare Other | Admitting: Anesthesiology

## 2021-03-22 DIAGNOSIS — I081 Rheumatic disorders of both mitral and tricuspid valves: Secondary | ICD-10-CM

## 2021-03-22 DIAGNOSIS — R7881 Bacteremia: Secondary | ICD-10-CM

## 2021-03-22 DIAGNOSIS — I509 Heart failure, unspecified: Secondary | ICD-10-CM

## 2021-03-22 DIAGNOSIS — D649 Anemia, unspecified: Secondary | ICD-10-CM

## 2021-03-22 DIAGNOSIS — I340 Nonrheumatic mitral (valve) insufficiency: Secondary | ICD-10-CM

## 2021-03-22 DIAGNOSIS — M1990 Unspecified osteoarthritis, unspecified site: Secondary | ICD-10-CM

## 2021-03-22 LAB — BASIC METABOLIC PANEL
Anion gap: 7 (ref 5–15)
BUN: 29 mg/dL — ABNORMAL HIGH (ref 6–20)
CO2: 22 mmol/L (ref 22–32)
Calcium: 10 mg/dL (ref 8.9–10.3)
Chloride: 116 mmol/L — ABNORMAL HIGH (ref 98–111)
Creatinine, Ser: 1.79 mg/dL — ABNORMAL HIGH (ref 0.61–1.24)
GFR, Estimated: 43 mL/min — ABNORMAL LOW (ref >60)
Glucose, Bld: 86 mg/dL (ref 70–99)
Potassium: 3.8 mmol/L (ref 3.5–5.1)
Sodium: 145 mmol/L (ref 135–145)

## 2021-03-22 LAB — ECHO TEE
MV M vel: 5.57 m/s
MV Peak grad: 124.1 mmHg

## 2021-03-22 LAB — CULTURE, BLOOD (ROUTINE X 2): Culture: NO GROWTH

## 2021-03-22 SURGERY — ECHOCARDIOGRAM, TRANSESOPHAGEAL
Anesthesia: Monitor Anesthesia Care

## 2021-03-22 MED ORDER — NYSTATIN 100000 UNIT/GM EX POWD
Freq: Three times a day (TID) | CUTANEOUS | Status: DC
  Filled 2021-03-22: qty 15

## 2021-03-22 MED ORDER — SODIUM CHLORIDE 0.9 % IV SOLN: INTRAVENOUS | Status: DC

## 2021-03-22 MED ORDER — SODIUM CHLORIDE 0.9% FLUSH
10.0000 mL | Freq: Two times a day (BID) | INTRAVENOUS | Status: DC
  Administered 2021-03-22 – 2021-03-23 (×2): 10 mL

## 2021-03-22 MED ORDER — PROPOFOL 500 MG/50ML IV EMUL
INTRAVENOUS | Status: DC | PRN
  Administered 2021-03-22: 75 ug/kg/min via INTRAVENOUS
  Administered 2021-03-22: 40 mg via INTRAVENOUS
  Administered 2021-03-22: 30 mg via INTRAVENOUS

## 2021-03-22 MED ORDER — SODIUM CHLORIDE 0.9% FLUSH: 10.0000 mL | INTRAVENOUS | Status: DC | PRN

## 2021-03-22 NOTE — Progress Notes (Signed)
?  Echocardiogram ?Echocardiogram Transesophageal has been performed. ? [name] [name] ?03/22/2021, 11:20 AM ?

## 2021-03-22 NOTE — Anesthesia Postprocedure Evaluation (Signed)
Anesthesia Post Note ? ?Patient: BLAIN HUNSUCKER ? ?Procedure(s) Performed: TRANSESOPHAGEAL ECHOCARDIOGRAM (TEE) ? ?  ? ?Patient location during evaluation: PACU ?Anesthesia Type: MAC ?Level of consciousness: awake and alert ?Pain management: pain level controlled ?Vital Signs Assessment: post-procedure vital signs reviewed and stable ?Respiratory status: spontaneous breathing, nonlabored ventilation and respiratory function stable ?Cardiovascular status: blood pressure returned to baseline and stable ?Postop Assessment: no apparent nausea or vomiting ?Anesthetic complications: no ? ? ?No notable events documented. ? ?Last Vitals:  ?Vitals:  ? 03/22/21 1110 03/22/21 1157  ?BP: 131/80 (!) 139/92  ?Pulse: 89 89  ?Resp: 14 17  ?Temp:  36.6 ?C  ?SpO2: 100% 100%  ?  ?Last Pain:  ?Vitals:  ? 03/22/21 1157  ?TempSrc: Oral  ?PainSc:   ? ? ?  ?  ?  ?  ?  ?  ? ?Lynda Rainwater ? ? ? ? ?

## 2021-03-22 NOTE — Anesthesia Preprocedure Evaluation (Signed)
Anesthesia Evaluation  ?Patient identified by MRN, date of birth, ID band ?Patient awake ? ? ? ?Reviewed: ?Allergy & Precautions, NPO status , Patient's Chart, lab work & pertinent test results ? ?History of Anesthesia Complications ?(+) PONV and history of anesthetic complications ? ?Airway ?Mallampati: II ? ?TM Distance: >3 FB ?Neck ROM: Full ? ?Mouth opening: Limited Mouth Opening ? Dental ? ?(+) Missing, Poor Dentition,  ?  ?Pulmonary ?neg pulmonary ROS, former smoker,  ?  ?Pulmonary exam normal ? ? ? ? ? ? ? Cardiovascular ?+CHF  ?Normal cardiovascular exam ? ?TTE 05/2020: EF 10-31%, grade I diastolic dysfunction, valves ok ?  ?Neuro/Psych ?negative neurological ROS ? negative psych ROS  ? GI/Hepatic ?Neg liver ROS, GERD  Medicated and Controlled,Rectal bleeding ?  ?Endo/Other  ?negative endocrine ROS ? Renal/GU ?Renal InsufficiencyRenal disease  ?negative genitourinary ?  ?Musculoskeletal ? ?(+) Arthritis ,  ? Abdominal ?  ?Peds ? Hematology ? ?(+) Blood dyscrasia, anemia , HIV, Hgb 7.9   ?Anesthesia Other Findings ?Day of surgery medications reviewed with patient. ? Reproductive/Obstetrics ?negative OB ROS ? ?  ? ? ? ? ? ? ? ? ? ? ? ? ? ?  ?  ? ? ? ? ? ? ? ? ?Anesthesia Physical ? ?Anesthesia Plan ? ?ASA: 3 ? ?Anesthesia Plan: MAC  ? ?Post-op Pain Management: Minimal or no pain anticipated  ? ?Induction:  ? ?PONV Risk Score and Plan: 2 and Treatment may vary due to age or medical condition and Propofol infusion ? ?Airway Management Planned: Natural Airway and Simple Face Mask ? ?Additional Equipment: None ? ?Intra-op Plan:  ? ?Post-operative Plan:  ? ?Informed Consent: I have reviewed the patients History and Physical, chart, labs and discussed the procedure including the risks, benefits and alternatives for the proposed anesthesia with the patient or authorized representative who has indicated his/her understanding and acceptance.  ? ?Patient has DNR.  ?Discussed DNR with  patient and Suspend DNR. ?  ? ? ?Plan Discussed with: CRNA ? ?Anesthesia Plan Comments:   ? ? ? ? ? ? ?Anesthesia Quick Evaluation ? ?

## 2021-03-22 NOTE — Progress Notes (Signed)
? ? ?[facility] ? ?Date of Admission:  03/17/2021    ? ?Total days of antibiotics 6 ?        ?ASSESSMENT: ? ?[name] [name]'s TEE has area concerning on the mitral valve for tract/ruptured abscess/prior caseating MAC. Blood cultures from 03/19/21 remain without growth to date. Given concern for ruptured abscess will plan on 6 weeks of treatment with oral fluconazole for presumed endocarditis. Will need to continue with wound care and optimize nutritional intake and continue with site offloading of the sacral wound. Remaining medical and supportive care per primary team. ? ?PLAN: ? ?Continue fluconazole through 04/30/21.  ?Continue Tivicay, Endurant and Prezcobix for ART therapy.  ?Monitor cultures for clearance of fugnemia ?Therapeutic monitoring of liver function while on fluconazole.  ?Continue wound care with optimizing nutrition and site off loading.  ?Follow up with [name] ? ?ID will sign off.  ? ? ? ?Principal Problem: ?  Acute metabolic encephalopathy ?Active Problems: ?  Human immunodeficiency virus (HIV) disease ([place]) ?  Protein-calorie malnutrition, severe ([place]) ?  Acute renal failure superimposed on stage 3a chronic kidney disease ([place]) ?  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  Chronic diastolic CHF (congestive heart failure) ([place]) ?  Normochromic normocytic anemia ?  Pressure injury of skin ?  High anion gap metabolic acidosis ?  Hypercalcemia ?  Nonrheumatic mitral valve regurgitation ? ? ? [MAR Hold] apixaban  2.5 mg Oral BID  ? [MAR Hold] Chlorhexidine Gluconate Cloth  6 each Topical Daily  ? [MAR Hold] darunavir-cobicistat  1 tablet Oral Daily  ? [MAR Hold] dolutegravir  50 mg Oral Daily  ? [MAR Hold] famotidine  10 mg Oral Daily  ? [MAR Hold] feeding supplement  237 mL Oral TID BM  ? [MAR Hold] fluconazole  200 mg Oral Daily  ? [MAR Hold] rilpivirine  25 mg Oral Q breakfast  ? [MAR Hold] sodium chloride flush  10-40 mL Intracatheter Q12H   ? ? ?SUBJECTIVE: ? ?Afebrile overnight with no acute events. TEE completed today.  ? ?Allergies  ?Allergen Reactions  ? Collagen Rash  ?  redness  ? ? ? ?Review of Systems: ?Review of Systems  ?Constitutional:  Negative for chills, fever and weight loss.  ?Respiratory:  Negative for cough, shortness of breath and wheezing.   ?Cardiovascular:  Negative for chest pain and leg swelling.  ?Gastrointestinal:  Negative for abdominal pain, constipation, diarrhea, nausea and vomiting.  ?Skin:  Negative for rash.  ? ? ? ?OBJECTIVE: ?Vitals:  ? 03/22/21 0945 03/22/21 1055 03/22/21 1100 03/22/21 1110  ?BP: (!) 143/90 118/75 117/85 131/80  ?Pulse: 90 91 89 89  ?Resp: _0 ?Temp: 98.6 ?F (37 ?C)     ?TempSrc: Temporal     ?SpO2: 100% 100% 92% 100%  ?Weight:      ?Height:      ? ?Body mass index is 16.34 kg/m?. ? ?Physical Exam ?Constitutional:   ?   General: He is not in acute distress. ?   Appearance: He is well-developed.  ?Eyes:  ?   Conjunctiva/sclera: Conjunctivae normal.  ?Cardiovascular:  ?   Rate and Rhythm: Normal rate and regular rhythm.  ?   Heart sounds: Normal heart sounds. No murmur heard. ?  No friction rub. No gallop.  ?Pulmonary:  ?   Effort: Pulmonary effort is normal. No respiratory distress.  ?   Breath sounds: Normal breath sounds. No wheezing or rales.  ?Chest:  ?  Chest wall: No tenderness.  ?Abdominal:  ?   General: Bowel sounds are normal.  ?   Palpations: Abdomen is soft.  ?   Tenderness: There is no abdominal tenderness.  ?Musculoskeletal:  ?   Cervical back: Neck supple.  ?Lymphadenopathy:  ?   Cervical: No cervical adenopathy.  ?Skin: ?   General: Skin is warm and dry.  ?   Findings: No rash.  ?Neurological:  ?   Mental Status: He is alert.  ? ? ?Lab Results ?Lab Results  ?Component Value Date  ? WBC 9.9 03/21/2021  ? HGB 8.0 (L) 03/21/2021  ? HCT 27.3 (L) 03/21/2021  ? MCV 94.8 03/21/2021  ? PLT 297 03/21/2021  ?  ?Lab Results  ?Component Value Date  ? CREATININE 1.79 (H) 03/22/2021  ? BUN  29 (H) 03/22/2021  ? NA 145 03/22/2021  ? K 3.8 03/22/2021  ? CL 116 (H) 03/22/2021  ? CO2 22 03/22/2021  ?  ?Lab Results  ?Component Value Date  ? ALT 14 03/18/2021  ? AST 15 03/18/2021  ? ALKPHOS 55 03/18/2021  ? BILITOT 0.4 03/18/2021  ?  ? ?Microbiology: ?Recent Results (from the past 240 hour(s))  ?Urine Culture     Status: Abnormal  ? Collection Time: 03/17/21  4:56 PM  ? Specimen: In/Out Cath Urine  ?Result Value Ref Range Status  ? Specimen Description IN/OUT CATH URINE  Final  ? Special Requests   Final  ?  NONE ?Performed at [facility], [place] [address] [place], [place] [address] ?  ? Culture 70,000 COLONIES/mL YEAST (A)  Final  ? Report Status 03/19/2021 FINAL  Final  ?Blood Culture (routine x 2)     Status: None  ? Collection Time: 03/17/21  5:14 PM  ? Specimen: BLOOD  ?Result Value Ref Range Status  ? Specimen Description BLOOD BLOOD LEFT HAND  Final  ? Special Requests   Final  ?  BOTTLES DRAWN AEROBIC ONLY Blood Culture results may not be optimal due to an inadequate volume of blood received in culture bottles  ? Culture   Final  ?  NO GROWTH 5 DAYS ?Performed at [facility], [place] [address] [place], [place] [address] ?  ? Report Status 03/22/2021 FINAL  Final  ?Blood Culture (routine x 2)     Status: Abnormal (Preliminary result)  ? Collection Time: 03/17/21  5:30 PM  ? Specimen: BLOOD  ?Result Value Ref Range Status  ? Specimen Description BLOOD RIGHT PICC LINE  Final  ? Special Requests   Final  ?  BOTTLES DRAWN AEROBIC AND ANAEROBIC Blood Culture adequate volume  ? Culture  Setup Time   Final  ?  AEROBIC BOTTLE ONLY ?YEAST ?CRITICAL RESULT CALLED TO, READ BACK BY AND VERIFIED WITH: ?PHARMD JAMES LEDFORD 03/19/21_0 :32 BY TW ?  ? Culture (A)  Final  ?  CANDIDA ALBICANS ?Sent to [facility] for further susceptibility testing. ?Performed at [facility], [place] [address] [place],  [address] ?  ? Report Status PENDING  Incomplete  ?Blood Culture ID Panel (Reflexed)     Status:  Abnormal  ? Collection Time: 03/17/21  5:30 PM  ?Result Value Ref Range Status  ? Enterococcus faecalis NOT DETECTED NOT DETECTED Final  ? Enterococcus Faecium NOT DETECTED NOT DETECTED Final  ? Listeria monocytogenes NOT DETECTED NOT DETECTED Final  ? Staphylococcus species NOT DETECTED NOT DETECTED Final  ? Staphylococcus aureus (BCID) NOT DETECTED NOT DETECTED Final  ? Staphylococcus  epidermidis NOT DETECTED NOT DETECTED Final  ? Staphylococcus lugdunensis NOT DETECTED NOT DETECTED Final  ? Streptococcus species NOT DETECTED NOT DETECTED Final  ? Streptococcus agalactiae NOT DETECTED NOT DETECTED Final  ? Streptococcus pneumoniae NOT DETECTED NOT DETECTED Final  ? Streptococcus pyogenes NOT DETECTED NOT DETECTED Final  ? A.calcoaceticus-baumannii NOT DETECTED NOT DETECTED Final  ? Bacteroides fragilis NOT DETECTED NOT DETECTED Final  ? Enterobacterales NOT DETECTED NOT DETECTED Final  ? Enterobacter cloacae complex NOT DETECTED NOT DETECTED Final  ? Escherichia coli NOT DETECTED NOT DETECTED Final  ? Klebsiella aerogenes NOT DETECTED NOT DETECTED Final  ? Klebsiella oxytoca NOT DETECTED NOT DETECTED Final  ? Klebsiella pneumoniae NOT DETECTED NOT DETECTED Final  ? Proteus species NOT DETECTED NOT DETECTED Final  ? Salmonella species NOT DETECTED NOT DETECTED Final  ? Serratia marcescens NOT DETECTED NOT DETECTED Final  ? Haemophilus influenzae NOT DETECTED NOT DETECTED Final  ? Neisseria meningitidis NOT DETECTED NOT DETECTED Final  ? Pseudomonas aeruginosa NOT DETECTED NOT DETECTED Final  ? Stenotrophomonas maltophilia NOT DETECTED NOT DETECTED Final  ? Candida albicans DETECTED (A) NOT DETECTED Final  ?  Comment: CRITICAL RESULT CALLED TO, READ BACK BY AND VERIFIED WITH: ?PHARMD JAMES LEDFORD 03/19/21_0 :32 BY TW ?  ? Candida auris NOT DETECTED NOT DETECTED Final  ? Candida glabrata NOT DETECTED NOT DETECTED Final  ? Candida krusei NOT DETECTED NOT DETECTED Final  ? Candida parapsilosis NOT DETECTED NOT DETECTED  Final  ? Candida tropicalis NOT DETECTED NOT DETECTED Final  ? Cryptococcus neoformans/gattii NOT DETECTED NOT DETECTED Final  ?  Comment: Performed at [facility] [address], [place], [place] [address]  ?Resp Pa

## 2021-03-22 NOTE — Plan of Care (Signed)

## 2021-03-22 NOTE — Transfer of Care (Signed)
Immediate Anesthesia Transfer of Care Note ? ?Patient: [name] [name] ? ?Procedure(s) Performed: TRANSESOPHAGEAL ECHOCARDIOGRAM (TEE) ? ?Patient Location: PACU and Endoscopy Unit ? ?Anesthesia Type:MAC ? ?Level of Consciousness: awake ? ?Airway & Oxygen Therapy: Patient Spontanous Breathing ? ?Post-op Assessment: Report given to RN and Post -op Vital signs reviewed and stable ? ?Post vital signs: Reviewed and stable ? ?Last Vitals:  ?Vitals Value Taken Time  ?BP    ?Temp    ?Pulse    ?Resp    ?SpO2    ? ? ?Last Pain:  ?Vitals:  ? 03/22/21 0945  ?TempSrc: Temporal  ?PainSc: 0-No pain  ?   ? ?  ? ?Complications: No notable events documented. ?

## 2021-03-22 NOTE — Progress Notes (Addendum)
HIV Genotype Composite Data ?Genotype Dates: 04/17/2006, 11/12/2013, 06/10/2014 ? ?Mutations in [place] impact drug susceptibility ?RT Mutations K65R, L74I, M184V, K103N, P225H  ?PI Mutations None  ?Integrase Mutations None  ? ?Interpretation of Genotype Data per [employer] HIV Database ?Nucleoside RTIs  ?Abacavir (ABC) - High level resistance ?Emtricitabine (FTC) - High level resistance ?Lamivudine (3TC) - High level resistance ? ?Tenofovir (TDF) - Intermediate resistance ? ?Zidovudine (AZT) - Susceptible  ? ?Non-Nucleoside RTIs  ?Efavirenz (EFV) - High level resistance ?Nevirapine (NVP) - High level resistance ? ?Doravirine (DOR) - Intermediate resistance ?Etravirine (ETR) - Susceptible ?Rilvpivirine (RPV) - Susceptible  ? ?Protease Inhibitors  ?Atazanavir/r (ATV/r) - Susceptible ?Darunavir/r (DRV/r) - Susceptible ?Lopinavir/r (LPV/r) - Susceptible   ? ?Integrase Inhibitors  ? N/A  ? ? ?Continuing on PTA regimen of Tivicay, Rilpivirine, Prezcobix ? ?Laurey Arrow, PharmD ?PGY1 Pharmacy Resident ?03/22/2021  11:39 AM ? ?Please check [email or web address] for unit-specific pharmacy phone numbers. ? ?

## 2021-03-22 NOTE — CV Procedure (Addendum)
? ? ?  TRANSESOPHAGEAL ECHOCARDIOGRAM  ? ?NAME:  [name] [name]   MRN: [id number] ?DOB:  19-Oct-1960   ADMIT DATE: 03/17/2021 ? ?INDICATIONS: ?Rule out endocarditis ? ?PROCEDURE:  ? ?Informed consent was obtained prior to the procedure. The risks, benefits and alternatives for the procedure were discussed and the patient comprehended these risks.  Risks include, but are not limited to, cough, sore throat, vomiting, nausea, somnolence, esophageal and stomach trauma or perforation, bleeding, low blood pressure, aspiration, pneumonia, infection, trauma to the teeth and death.   ? ?Procedural time out performed.  ?Patient received monitored anesthesia care under the supervision of Dr. Sabra Heck. Patient received a total of 244 mg propofol during the procedure. ? ?The transesophageal probe was inserted in the esophagus and stomach without difficulty and multiple views were obtained.  ? ? ?COMPLICATIONS:   ? ?There were no immediate complications. ? ?FINDINGS: ? ?LEFT VENTRICLE: EF = 55-60%. No regional wall motion abnormalities. ? ?RIGHT VENTRICLE: Normal size and function.  ? ?LEFT ATRIUM: No thrombus/mass. ? ?LEFT ATRIAL APPENDAGE: No thrombus/mass.  ? ?RIGHT ATRIUM: No thrombus/mass. ? ?AORTIC VALVE:  Trileaflet. No regurgitation. No vegetation. ? ?MITRAL VALVE:    Abnormal valve. The leaflets appear myxomatous. There is a focal jet that appears to be at the mitral annulus/myocardium, appears to be posterior leaflet but jet is very eccentric. There is a severe focal jet of flow from the ventricle, through the leaflet/annulus into the left atrium. On transgastric views, this may extend into the myocardium. Concern for tract/ruptured abscess/prior caseating MAC. ? ?TRICUSPID VALVE: Normal structure. Trivial regurgitation. No vegetation. ? ?PULMONIC VALVE: Grossly normal structure. trivial regurgitation. No apparent vegetation. ? ?INTERATRIAL SEPTUM: No PFO or ASD seen by color Doppler. ? ?PERICARDIUM: No significant effusion  noted. ? ?DESCENDING AORTA: No significant plaque seen ? ? ?CONCLUSION: Abnormal mitral valve. The leaflets appear myxomatous. There is a focal jet that appears to be at the posterior leaflet towards the lateral mitral annulus. There is a severe focal jet of flow from the ventricle, through the leaflet/annulus into the left atrium. On transgastric views, this may extend into the myocardium. Concern for tract/ruptured abscess/prior caseating MAC. Given clinical scenario, concern would be for ruptured abscess. Findings communicated with team. ? ? ?Buford Dresser, MD, PhD ?[facility]  ?[address] ?[place],  [address] ?[phone]  ? ?10:42 AM ?  ?

## 2021-03-22 NOTE — Progress Notes (Signed)
?PROGRESS NOTE ? ? ? ?[name] [name]  GNF:[id number] DOB: 1960-05-21 DOA: 03/17/2021 ?PCP: [name], MD  ? ? ?Brief Narrative:  ?[name] [name] is a 61 year-old male with PMH significant for HIV/AIDS on chronic antiviral therapy, rectal cancer, sacral decubitus ulcers, chronic anemia with baseline hemoglobin 7-9, chronic urinary retention in the setting of urethral stricture with chronic indwelling Foley catheter, chronic diastolic heart failure, functional quadriplegia, CKD stage IIIA associated with baseline serum creatinine 1.4-1.7 admitted with suspected acute metabolic encephalopathy in the setting of severe sepsis.  ? ? ?Assessment & Plan: ?  ?Assessment and Plan: ?* Acute metabolic encephalopathy ?Patient presenting to the ED with 2-3 days of confusion relative to his baseline mental status.  Likely multifactorial in the setting of sepsis, dehydration, AKI on CKD stage IIIa, hypercalcemia and anion gap metabolic acidosis.  Ammonia level within normal limits.  Treated with antibiotics, IV fluid hydration with resolution of encephalopathy, now back to his baseline mental status.   ?--Continue treatment as below ?  ? 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  Acute metabolic encephalopathy which now resolved. ?Chest X-ray no evidence of acute cardiopulmonary process.   ?Procalcitonin 0.29.  COVID/influenza a/B PCR negative. UA significant pyuria in the context of chronic indwelling Foley catheter. ?In terms of source of suspected underlying infection, possibilities include infected left lateral foot wound as well as infected sacral decubitus ulcer.  ?--Infectious disease following, appreciate assistance ?--TTE with thickened MV leaflets, LV EF 55-60%, significant MR. ?--TEE: EF 55-60%, LA/RA and left atrial appendage no thrombus/mass, aortic valve no  vegetation, mitral valve abnormal leaflets appear myxomatous with focal jet mitral annulus/myocardium with severe focal jet flow from ventricle through leaflet/annulus concern for tract/ruptured abscess prior caseating MAC, tricuspid valve no vegetation, pulmonic valve no vegetation no PFO/ASD. ?--Blood Cx 3/9: + CANDIDA ALBICANS; susceptibilities pending ?--Repeat blood cultures 3/11: No growth x2 days ?--Fluconazole 200 mg IV q24h ?--Will need outpatient ophthalmology evaluation for endophthalmitis ?  ? ?Acute renal failure superimposed on stage 3a chronic kidney disease ([place]) ?Baseline serum creatinine 1.4-1.7.  Creatinine on admission 2.96. ?Likely prerenal in the setting of sepsis.   ?--Cr 2.96>>2.32>1.79 ?--Continue LR at 100 mL/hr ?--Avoid nephrotoxic medications, renally dose all medications ?--BMP daily ? ? ?Human immunodeficiency virus (HIV) disease ([place]) ?CD4 count 487. Follows with ID outpatient, [name]. ?--Continue Tivicay, Prezcobix, and Edurant for HIV ART  ?  ? ?Chronic diastolic CHF (congestive heart failure) ([place]) ?TTE with LVEF 55-60%, significant MR. ?--Strict I's and O's Daily weights ? ? ?  ? ?Normochromic normocytic anemia ?This could be secondary to HIV, underlying malignancy. Baseline Hb 7-9.   Hemoglobin 5.9 on arrival. FOBT negative. Transfused 2 unit PRBC on 03/18/2021 ?--Hgb  8.3>>5.9>5.8>7.7>8.2>8.0 ?--CBC q3d ? ?High anion gap metabolic acidosis ?Presented with high anion gap metabolic acidosis.   Likely multifactorial,  contribution from lactic acidosis, AKI on CKD stage IIIa ?Improved with IV hydration.  ? ?  ?  ?  ? ?Hypercalcemia ?Calcium 10.7 on admission, likely secondary to severe dehydration resolved with IV fluid hydration. ? ?Pressure injury of skin ?Patient with known sacral 5.5 cm x 5 cm x 0.4 cm decubitus ulcer and left foot with 7 cm x 7 cm fifth digit plantar/lateral foot with full thickness wound and maceration.  ?--Evaluated by wound care RN on 3/10 ?--Topical  wound care with calcium alginate  to the left foot changed daily with offloading, the sacral wound will require a silver hydrofiber with daily dressing changes. ? ? ? ?Protein-calorie malnutrition, severe ([place]) ?Notable in the context of HIV AIDS. Body mass index is 16.34 kg/m?Marland Kitchen ?Nutrition Status: ?Nutrition Problem: Severe Malnutrition ?Etiology: chronic illness (HIV/AIDS) ?Signs/Symptoms: severe fat depletion, severe muscle depletion ?Interventions: Ensure Enlive (each supplement provides 350kcal and 20 grams of protein) ?--Dietitian following, encourage increase oral intake, supplementation ? ? ? ? ? ?DVT prophylaxis: apixaban (ELIQUIS) tablet 2.5 mg Start: 03/17/21 2330 ?SCDs Start: 03/17/21 2143 ?apixaban (ELIQUIS) tablet 2.5 mg  ?  Code Status: DNR ?Family Communication: No family present at bedside this morning ? ?Disposition Plan:  ?Level of care: Progressive ?Status is: Inpatient ?Remains inpatient appropriate because: Continues on IV antibiotics, awaiting culture susceptibilities, awaiting further recommendations per ID, pending TEE tomorrow ?  ? ?Consultants:  ?Infectious disease ?Cardiology for TEE ? ?Procedures:  ?TTE ?TEE: ? ?Antimicrobials:  ?Fluconazole 3/11>> ?Vancomycin 3/9 - 3/10 ?Cefepime 3/9 - 3/10 ?Metronidazole 3/9 - 3/10 ?Daptomycin 3/10 - 3/12 ? ? ?Subjective: ?Seen and examined at bedside, resting comfortably.  About to go down for TEE.  No specific complaints this morning.  Continues on IV antibiotics with fluconazole.  Per discussion with cardiology, TEE concerning for a channel from the LV through the annulus into the atrium from likely previous abscess but no abscess apparently today. ?Patient denies headache, no dizziness, no chest pain, palpitations, no shortness of breath, no abdominal pain.  No acute events overnight per nursing staff. ? ?Objective: ?Vitals:  ? 03/22/21 1055 03/22/21 1100 03/22/21 1110 03/22/21 1157  ?BP: 118/75 117/85 131/80 (!) 139/92  ?Pulse: 91 89 89 89  ?Resp:  _0 ?Temp:    97.9 ?F (36.6 ?C)  ?TempSrc:    Oral  ?SpO2: 100% 92% 100% 100%  ?Weight:      ?Height:      ? ? ?Intake/Output Summary (Last 24 hours) at 03/22/2021 1229 ?Last data filed at 03/22/2021 1040 ?Gross per 24 hour  ?Intake 1047.14 ml  ?Output 2400 ml  ?Net -1352.86 ml  ? ?Filed Weights  ? 03/18/21 0112  ?Weight: 57.7 kg  ? ? ?Examination: ? ?Physical Exam: ?GEN: NAD, alert and oriented x 3, thin/chronically ill/cachectic in appearance, appears older than stated age ?HEENT: NCAT, PERRL, EOMI, sclera clear, MMM ?PULM: CTAB w/o wheezes/crackles, normal respiratory effort, on room air ?CV: RRR w/o M/G/R ?GI: abd soft, NTND, NABS, no R/G/M ?MSK: no peripheral edema ?NEURO: Alert and oriented to person/place, but not time or situation ?PSYCH: normal mood/affect ?Integumentary: Stage IV sacral decubitus ulcer without active bleeding, left foot wound noted ? ? ? ?Data Reviewed: I have personally reviewed following labs and imaging studies ? ?CBC: ?Recent Labs  ?Lab 03/17/21 ?1735 03/17/21 ?1755 03/18/21 ?0253 03/18/21 ?4081 03/18/21 ?1852 03/20/21 ?4481 03/21/21 ?8563  ?WBC 19.7*  --  9.7  --   --  12.9* 9.9  ?NEUTROABS 12.5*  --  6.4  --   --  9.6* 5.8  ?HGB 8.3*   < > 5.9* 5.8* 7.7* 8.2* 8.0*  ?HCT 30.0*   < > 20.3* 19.4* 23.8* 27.5* 27.3*  ?MCV 99.3  --  94.4  --   --  94.8 94.8  ?PLT 551*  --  338  --   --  296 297  ? < > = values in this interval not displayed.  ? ?Basic Metabolic Panel: ?Recent Labs  ?Lab 03/18/21 ?0253 03/19/21 ?0140 03/20/21 ?[place]  03/21/21 ?6283 03/22/21 ?0255  ?NA 138 138 142 145 145  ?K 3.2* 3.4* 4.2 3.9 3.8  ?CL 110 113* 116* 116* 116*  ?CO2 16* 15* 18* 19* 22  ?GLUCOSE 85 93 138* 82 86  ?BUN 46* 45* 45* 38* 29*  ?CREATININE 2.41* 2.83* 2.94* 2.32* 1.79*  ?CALCIUM 9.3 9.2 9.8 10.1 10.0  ?MG 1.6*  1.5* 1.9 1.9 1.6*  --   ?PHOS 3.9  --   --   --   --   ? ?GFR: ?Estimated Creatinine Clearance: 36.3 mL/min (A) (by C-G formula based on SCr of 1.79 mg/dL (H)). ?Liver Function  Tests: ?Recent Labs  ?Lab 03/17/21 ?1735 03/18/21 ?0253  ?AST 23 15  ?ALT 19 14  ?ALKPHOS 77 55  ?BILITOT 0.3 0.4  ?PROT 9.1* 6.8  ?ALBUMIN 2.7* 2.0*  ? ?No results for input(s): LIPASE, AMYLASE in the last 168 hours.

## 2021-03-23 ENCOUNTER — Encounter (HOSPITAL_COMMUNITY): Payer: Self-pay | Admitting: Cardiology

## 2021-03-23 LAB — BASIC METABOLIC PANEL
Anion gap: 8 (ref 5–15)
BUN: 18 mg/dL (ref 6–20)
CO2: 23 mmol/L (ref 22–32)
Calcium: 9.3 mg/dL (ref 8.9–10.3)
Chloride: 110 mmol/L (ref 98–111)
Creatinine, Ser: 1.36 mg/dL — ABNORMAL HIGH (ref 0.61–1.24)
GFR, Estimated: 60 mL/min — ABNORMAL LOW (ref >60)
Glucose, Bld: 85 mg/dL (ref 70–99)
Potassium: 3 mmol/L — ABNORMAL LOW (ref 3.5–5.1)
Sodium: 141 mmol/L (ref 135–145)

## 2021-03-23 LAB — CBC
HCT: 25.5 % — ABNORMAL LOW (ref 39.0–52.0)
Hemoglobin: 7.7 g/dL — ABNORMAL LOW (ref 13.0–17.0)
MCH: 28.1 pg (ref 26.0–34.0)
MCHC: 30.2 g/dL (ref 30.0–36.0)
MCV: 93.1 fL (ref 80.0–100.0)
Platelets: 283 10*3/uL (ref 150–400)
RBC: 2.74 MIL/uL — ABNORMAL LOW (ref 4.22–5.81)
RDW: 16.6 % — ABNORMAL HIGH (ref 11.5–15.5)
WBC: 9.5 10*3/uL (ref 4.0–10.5)
nRBC: 0 % (ref 0.0–0.2)

## 2021-03-23 MED ORDER — FLUCONAZOLE 200 MG PO TABS: 200.0000 mg | ORAL_TABLET | Freq: Every day | ORAL | 0 refills | Status: DC

## 2021-03-23 MED ORDER — OXYCODONE HCL 5 MG PO CAPS: 5.0000 mg | ORAL_CAPSULE | ORAL | 0 refills | Status: DC

## 2021-03-23 MED ORDER — POTASSIUM CHLORIDE CRYS ER 20 MEQ PO TBCR
40.0000 meq | EXTENDED_RELEASE_TABLET | ORAL | Status: DC
  Administered 2021-03-23: 40 meq via ORAL
  Filled 2021-03-23: qty 2

## 2021-03-23 MED ORDER — NYSTATIN 100000 UNIT/GM EX POWD: Freq: Three times a day (TID) | CUTANEOUS | 0 refills | Status: AC

## 2021-03-23 NOTE — TOC Transition Note (Signed)
Transition of Care (TOC) - CM/SW Discharge Note ? ? ?Patient Details  ?Name: [name] [name] ?MRN: [id number] ?Date of Birth: 1960/07/13 ? ?Transition of Care [facility]) CM/SW Contact:  ?Geralynn Ochs, LCSW ?Phone Number: ?03/23/2021, 10:04 AM ? ? ?Clinical Narrative:   CSW alerted [facility] about discharge today, sent discharge information. CSW left a voicemail for sister, [name], to inform of return to [place] today. Transport scheduled with [facility] for next available. ? ?Nurse to call report to [phone]. ? ? ? ?Final next level of care: [place] ?Barriers to Discharge: Barriers Resolved ? ? ?Patient Goals and CMS Choice ?Patient states their goals for this hospitalization and ongoing recovery are:: feel better ?CMS Medicare.gov Compare Post Acute Care list provided to:: Patient ?Choice offered to / list presented to : Patient ? ?Discharge Placement ?  ?           ?Patient chooses bed at: [facility] ?Patient to be transferred to facility by: [facility] ?Name of family member notified: [name], voicemail ?Patient and family notified of of transfer: 03/23/21 ? ?Discharge Plan and Services ?  ?  ?Post Acute Care Choice: [place]          ?  ?  ?  ?  ?  ?  ?  ?  ?  ?  ? ?Social Determinants of Health (SDOH) Interventions ?  ? ? ?Readmission Risk Interventions ?Readmission Risk Prevention Plan 12/23/2020 05/28/2020  ?Transportation Screening Complete Complete  ?PCP or Specialist Appt within 3-5 Days - Complete  ?[place] or Home Care Consult - Complete  ?Social Work Consult for [place] Planning/Counseling - Complete  ?Palliative Care Screening - Complete  ?Medication Review Press photographer) Complete Complete  ?PCP or Specialist appointment within 3-5 days of discharge Complete -  ?[place] or Home Care Consult Complete -  ?SW Recovery Care/Counseling Consult Complete -  ?Palliative Care Screening Not Applicable -  ?Skilled Nursing Facility Complete -  ?Some recent data might be  hidden  ? ? ? ? ? ?

## 2021-03-23 NOTE — Assessment & Plan Note (Signed)
Outpatient follow-up with cardiology. ?

## 2021-03-23 NOTE — Progress Notes (Signed)
Report was called to [facility] at this time.  All belongings packed and PIVs were removed.  Patient with no complaints.  Discharged with [facility] at this time. ?

## 2021-03-23 NOTE — Care Management Important Message (Signed)
Important Message ? ?Patient Details  ?Name: [name] [name] ?MRN: [id number] ?Date of Birth: 05-11-1960 ? ? ?Medicare Important Message Given:  Yes ? ? ? ? ?Ernesha Ramone ?03/23/2021, 1:43 PM ?

## 2021-03-23 NOTE — Discharge Summary (Addendum)
?Physician Discharge Summary  ?[name] [name] YQI:[id number] DOB: 06-Mar-1960 DOA: 03/17/2021 ? ?PCP: [name], MD ? ?Admit date: 03/17/2021 ?Discharge date: 03/23/2021 ? ?Admitted From: [facility] ?Disposition: [facility] ? 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 ?Discharge Condition: Stable, but overall poor prognosis ?CODE STATUS: DNR ?Diet recommendation: Regular diet ? ?History of present illness: ? ?[name] [name] is a 61 year-old male with PMH significant for HIV/AIDS on chronic antiviral therapy, rectal cancer, sacral decubitus ulcers, chronic anemia with baseline hemoglobin 7-9, chronic urinary retention in the setting of urethral stricture with chronic indwelling Foley catheter, chronic diastolic heart failure, functional quadriplegia, CKD stage IIIA associated with baseline serum creatinine 1.4-1.7 admitted with suspected acute metabolic encephalopathy in the setting of severe sepsis. ? ?Hospital course: ? ?Assessment and Plan: ?* Acute metabolic encephalopathy ?Patient presenting to the ED with 2-3 days of confusion relative to his baseline mental status.  Likely multifactorial in the setting of sepsis, dehydration, AKI on CKD stage IIIa, hypercalcemia and anion gap metabolic acidosis.  Ammonia level within normal limits.  Treated with antibiotics, IV fluid hydration with resolution of encephalopathy, now back to his baseline mental status.   ?  ? 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  Acute metabolic encephalopathy which now resolved. ?Chest X-ray no evidence of acute cardiopulmonary process.   ?Procalcitonin 0.29.  COVID/influenza a/B PCR negative. UA significant pyuria in the context of chronic indwelling Foley catheter. ?In terms of source of suspected underlying infection, possibilities include infected left lateral foot wound as well as infected sacral decubitus ulcer.   Infectious disease was consulted and followed during hospital course. Blood Cx 3/9: + CANDIDA ALBICANS. TTE with thickened MV leaflets, LV EF 55-60%, significant MR. TEE: EF 55-60%, LA/RA and left atrial appendage no thrombus/mass, aortic valve no vegetation, mitral valve abnormal leaflets appear myxomatous with focal jet mitral annulus/myocardium with severe focal jet flow from ventricle through leaflet/annulus concern for tract/ruptured abscess prior caseating MAC, tricuspid valve no vegetation, pulmonic valve no vegetation no PFO/ASD.  Repeat blood cultures 03/19/2021 with no growth for 3 days at time of discharge.  ID recommends treating as presumed endocarditis with 6-week course of fluconazole.  Follow-up finalized blood cultures and susceptibilities that were pending at time of discharge; as they were sent off to [facility] with [name] in 1-2 weeks. Will need outpatient ophthalmology evaluation for endophthalmitis ?  ? ?Acute renal failure superimposed on stage 3a chronic kidney disease ([place]) ?Baseline serum creatinine 1.4-1.7.  Creatinine on admission 2.96. ?Likely prerenal in the setting of sepsis.  Patient was hydrated with IV fluids during hospitalization with improvement of his creatinine to 1.36 at time of discharge.  Recommend repeat BMP 1 week. ? ? ?Human immunodeficiency virus (HIV) disease ([place]) ?CD4 count 487.  Follows with ID outpatient, [name]. Continue Tivicay, Prezcobix, and Edurant for HIV ART  ?  ? ?Chronic diastolic CHF (congestive heart failure) ([place]) ?TTE with  LVEF 55-60%, significant MR. ? ? ?  ? ?Normochromic normocytic anemia ?This could be secondary to HIV, underlying malignancy. Baseline Hb 7-9.   Hemoglobin 5.9 on arrival. FOBT negative. Transfused 2 unit PRBC on 03/18/2021.  Hemoglobin 8.0 at time of discharge.,  Stable.  Recommend repeat CBC 1 week. ? ? ?High anion gap metabolic acidosis ?Presented with high anion gap metabolic acidosis.   Likely multifactorial,  contribution from lactic acidosis, AKI on CKD stage IIIa ?Improved with IV hydration.  ? ?  ?  ?  ? ?Hypercalcemia ?Calcium 10.7 on admission, likely secondary to severe dehydration resolved with IV fluid hydration. ? ?Pressure injury of skin ?Patient with known sacral 5.5 cm x 5 cm x 0.4 cm decubitus ulcer and left foot with 7 cm x 7 cm fifth digit plantar/lateral foot with full thickness wound and maceration. Evaluated by wound care RN on 3/10. Topical wound care with calcium alginate to the left foot changed daily with offloading, the sacral wound will require a silver hydrofiber with daily dressing changes.  We will need to continue aggressive wound management outpatient with increased nutrition, offloading and local wound care. ? ? ? ?Protein-calorie malnutrition, severe ([place]) ?Notable in the context of HIV AIDS. Body mass index is 16.34 kg/m?Marland Kitchen ?Nutrition Status: ?Nutrition Problem: Severe Malnutrition ?Etiology: chronic illness (HIV/AIDS) ?Signs/Symptoms: severe fat depletion, severe muscle depletion ?Interventions: Ensure Enlive (each supplement provides 350kcal and 20 grams of protein) ?Dietitian follow during hospital course, encourage increase oral intake, supplementation ? ? ? ?Nonrheumatic mitral valve regurgitation ?Outpatient follow-up with cardiology. ? ? ? ? ? ? ?Discharge Diagnoses:  ?Principal Problem: ?  Acute metabolic  encephalopathy ?Active Problems: ?  Severe sepsis; Candidia albicans bacteremia ([place]) ?  Human immunodeficiency virus (HIV) disease ([place]) ?  Acute renal failure superimposed on stage 3a chronic kidney disease ([place]) ?  Chronic diastolic CHF (congestive heart failure) ([place]) ?  Normochromic normocytic anemia ?  High anion gap metabolic acidosis ?  Hypercalcemia ?  Pressure injury of skin ?  Protein-calorie malnutrition, severe ([place]) ?  Nonrheumatic mitral valve regurgitation ? ? ? ?Discharge Instructions ? ?Discharge Instructions   ? ? Ambulatory referral to Cardiology   Complete by: As directed ?  ? Call MD for:  difficulty breathing, headache or visual disturbances   Complete by: As directed ?  ? Call MD for:  extreme fatigue   Complete by: As directed ?  ? Call MD for:  persistant dizziness or light-headedness   Complete by: As directed ?  ? Call MD for:  persistant nausea and vomiting   Complete by: As directed ?  ? Call MD for:  severe uncontrolled pain   Complete by: As directed ?  ? Call MD for:  temperature >100.4   Complete by: As directed ?  ? Diet - low sodium heart healthy   Complete by: As directed ?  ? Discharge wound care:   Complete by: As directed ?  ? Wound care to sacral Stage 4 pressure injury:  Cleanse with NS, pat dry. COver with size appropriate piece of silver hydrofiber Aquacel  Ag+ Advantage top with dry gauze and top with sacral silicone foam placed with "tip" pointing away from anus. Change silver hydrofiber daily, may reuse silicone foam for up to 3 days, change PRN soiling. ? ?Wound care to left foot, 5th digit at plantar and lateral foot: wash with soap and water, rinse and pat dry. Cover with calcium alginate dressing cut to fit wound area, top  with ABD pad and secure with Kerlix roll gauze/paper tape. Change daily. Float foot on pillow.  ? Increase activity slowly   Complete by: As directed ?  ? ?  ? ?Allergies as of 03/23/2021   ? ?   Reactions  ? Collagen Rash  ? redness  ? ?  ? ?   ?Medication List  ?  ? ?STOP taking these medications   ? ?bisacodyl 10 MG suppository ?Commonly known as: DULCOLAX ?  ?cefTRIAXone 1 g injection ?Commonly known as: ROCEPHIN ?  ?cephALEXin 500 MG capsule ?Co

## 2021-03-23 NOTE — NC FL2 (Signed)
?[place] MEDICAID FL2 LEVEL OF CARE SCREENING TOOL  ?  ? ?IDENTIFICATION  ?Patient Name: ?[name] [name] Birthdate: [birth year] Sex: male Admission Date (Current Location): ?03/17/2021  ?[place] and [place] Number: ? [place] ?  Facility and Address:  ?[facility], [place] [address] [place], [place] [address] ?     Provider Number: ?[id number]  ?Attending Physician Name and Address:  ?[place] Eric J, DO ? Relative Name and Phone Number:  ?  ?   ?Current Level of Care: ?Hospital Recommended Level of Care: ?[place] Prior Approval Number: ?  ? ?Date Approved/Denied: ?  PASRR Number: ?  ? ?Discharge Plan: ?SNF ?  ? ?Current Diagnoses: ?Patient Active Problem List  ? Diagnosis Date Noted  ? Nonrheumatic mitral valve regurgitation   ? High anion gap metabolic acidosis 52/84/1324  ? Hypercalcemia 03/17/2021  ? Thrombocytopenia ([place]) 12/21/2020  ? Bleeding from wound 12/19/2020  ? ABLA (acute blood loss anemia) 12/19/2020  ? Acute urinary retention 12/19/2020  ? FTT (failure to thrive) in adult 12/19/2020  ? GIB (gastrointestinal bleeding) 12/16/2020  ? Palliative care encounter 12/09/2020  ? Ileus ([place]) 12/09/2020  ? Encephalopathy   ? Pressure injury of skin 10/10/2020  ? Acute encephalopathy 10/10/2020  ? Acute metabolic encephalopathy 40/10/2723  ? 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 Chronic diastolic CHF (congestive heart failure) ([place]) 10/09/2020  ? Normochromic normocytic anemia 10/09/2020  ? Rigidity 10/09/2020  ? Hypernatremia 10/09/2020  ? Functional quadriplegia ([place]) 10/09/2020  ? Onychomycosis 08/19/2020  ? Penile ulcer 08/19/2020  ? AKI (acute kidney injury) ([place]) 05/13/2020  ? Lactic acidosis 05/13/2020  ? SDH (subdural hematoma) 05/13/2020  ? Hypothermia due to exposure 05/13/2020  ? Severe sepsis; Candidia albicans bacteremia ([place]) 05/13/2020  ? Cellulitis 05/13/2020  ? Elevated brain natriuretic peptide (BNP) level 05/13/2020  ? Volume overload 05/13/2020  ?  Dehydration 05/13/2020  ? Decubitus ulcer 05/13/2020  ? Subdural empyema   ? Streptococcal bacteremia   ? Rectal bleeding 02/20/2020  ? Abnormal digital rectal exam 02/20/2020  ? History of anal cancer 02/20/2020  ? Poor dentition 01/16/2017  ? Acute renal failure superimposed on stage 3a chronic kidney disease ([place]) 06/10/2014  ? Septic arthritis of hip ([place]) 07/31/2013  ? Sacral decubitus ulcer, stage IV ([place]) 07/31/2013  ? Protein-calorie malnutrition, severe ([place]) 07/31/2013  ? Pain in limb 07/23/2013  ? Inguinal adenopathy 02/18/2013  ? Enlargement of lymph nodes 01/13/2013  ? 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 ANEMIA, CHRONIC 03/20/2007  ? Unspecified severe protein-calorie malnutrition ([place]) 02/14/2007  ? Acute thromboembolism of deep veins of lower extremity ([place]) 08/22/2006  ? 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[place]) 05/14/2006  ? ABSCESS, RECTUM 04/11/2006  ? Thrush of mouth and esophagus ([place]) 02/16/2006  ? DIARRHEA 02/16/2006  ? Human immunodeficiency virus (HIV) disease ([place]) 10/19/2005  ? MOLLUSCUM CONTAGIOSUM 10/19/2005  ? GOUT 10/19/2005  ? HEPATITIS B, HX OF 10/19/2005  ? HERPES ZOSTER, HX OF 10/19/2005  ? ? ?Orientation RESPIRATION BLADDER Height & Weight   ?  ?Self, Situation, Place ? Normal Indwelling catheter Weight: 127 lb 4.8 oz (57.7 kg) ?Height:  '6\' 2"'$  (188 cm)  ?BEHAVIORAL SYMPTOMS/MOOD NEUROLOGICAL BOWEL NUTRITION STATUS  ?    Incontinent Diet (regular)  ?AMBULATORY STATUS COMMUNICATION OF NEEDS Skin   ?Extensive Assist Verbally PU Stage and Appropriate Care, Other (Comment) (left toe ulcer, gauze dressing change daily) ?  ?  ?  ?PU Stage 4 Dressing: Daily (sacrum, ABD)   ?     ?     ? ? ?  Personal Care Assistance Level of Assistance  ?Bathing, Feeding, Dressing Bathing Assistance: Maximum assistance ?Feeding assistance: Maximum assistance ?Dressing Assistance: Maximum assistance ?   ? ?Functional Limitations Info  ?    ?  ?   ? ? ?SPECIAL CARE FACTORS FREQUENCY  ?    ?  ?  ?  ?  ?  ?  ?    ? ? ?Contractures Contractures Info: Not present  ? ? ?Additional Factors Info  ?Code Status, Allergies Code Status Info: DNR ?Allergies Info: Collagen ?  ?  ?  ?   ? ?Current Medications (03/23/2021):  This is the current hospital active medication list ?Current Facility-Administered Medications  ?Medication Dose Route Frequency Provider Last Rate Last Admin  ? acetaminophen (TYLENOL) tablet 650 mg  650 mg Oral Q6H PRN Buford Dresser, MD      ? Or  ? acetaminophen (TYLENOL) suppository 650 mg  650 mg Rectal Q6H PRN Buford Dresser, MD      ? apixaban Arne [place]) tablet 2.5 mg  2.5 mg Oral BID Buford Dresser, MD   2.5 mg at 03/23/21 5573  ? Chlorhexidine Gluconate Cloth 2 % PADS 6 each  6 each Topical Daily Buford Dresser, MD   6 each at 03/23/21 [phone]  ? darunavir-cobicistat (PREZCOBIX) 800-150 MG per tablet 1 tablet  1 tablet Oral Daily Buford Dresser, MD   1 tablet at 03/21/21 1028  ? dolutegravir (TIVICAY) tablet 50 mg  50 mg Oral Daily Buford Dresser, MD   50 mg at 03/23/21 0925  ? famotidine (PEPCID) tablet 10 mg  10 mg Oral Daily Buford Dresser, MD   10 mg at 03/23/21 5427  ? feeding supplement (ENSURE ENLIVE / ENSURE PLUS) liquid 237 mL  237 mL Oral TID BM Buford Dresser, MD   237 mL at 03/23/21 0926  ? fluconazole (DIFLUCAN) tablet 200 mg  200 mg Oral Daily Buford Dresser, MD   200 mg at 03/23/21 0623  ? nystatin (MYCOSTATIN/NYSTOP) topical powder   Topical TID [place] Eric J, DO   Given at 03/23/21 7628  ? oxyCODONE (Oxy IR/ROXICODONE) immediate release tablet 5 mg  5 mg Oral Q6H PRN Buford Dresser, MD   5 mg at 03/22/21 2036  ? potassium chloride SA (KLOR-CON M) CR tablet 40 mEq  40 mEq Oral Q3H [place] Eric J, DO   40 mEq at 03/23/21 3151  ? rilpivirine (EDURANT) tablet 25 mg  25 mg Oral Q breakfast Buford Dresser, MD   25 mg at 03/23/21 7616  ? sodium chloride flush (NS) 0.9 % injection 10-40 mL  10-40 mL Intracatheter  Q12H Buford Dresser, MD   10 mL at 03/23/21 0737  ? sodium chloride flush (NS) 0.9 % injection 10-40 mL  10-40 mL Intracatheter PRN Buford Dresser, MD      ? ? ? ?Discharge Medications: ?Please see discharge summary for a list of discharge medications. ? ?Relevant Imaging Results: ? ?Relevant Lab Results: ? ? ?Additional Information ?SS#: [id number] ? ?Geralynn Ochs, LCSW ? ? ? ? ?

## 2021-03-24 ENCOUNTER — Encounter: Payer: Self-pay | Admitting: Infectious Diseases

## 2021-03-24 DIAGNOSIS — B376 Candidal endocarditis: Secondary | ICD-10-CM | POA: Insufficient documentation

## 2021-03-24 LAB — ANTIFUNGAL AST 9 DRUG PANEL
Amphotericin B MIC: 0.5
Fluconazole Islt MIC: 8
Flucytosine MIC: 1
Itraconazole MIC: 0.25
Posaconazole MIC: 0.12

## 2021-03-24 LAB — CULTURE, BLOOD (ROUTINE X 2)
Culture: NO GROWTH
Culture: NO GROWTH

## 2021-03-25 LAB — METHYLMALONIC ACID, SERUM: Methylmalonic Acid, Quantitative: 587 nmol/L — ABNORMAL HIGH (ref 0–378)

## 2021-03-30 LAB — CULTURE, BLOOD (ROUTINE X 2): Special Requests: ADEQUATE

## 2021-03-31 ENCOUNTER — Encounter: Payer: Self-pay | Admitting: Infectious Diseases

## 2021-04-07 ENCOUNTER — Ambulatory Visit (INDEPENDENT_AMBULATORY_CARE_PROVIDER_SITE_OTHER): Payer: Medicare Other | Admitting: Infectious Diseases

## 2021-04-07 ENCOUNTER — Telehealth: Payer: Self-pay

## 2021-04-07 ENCOUNTER — Other Ambulatory Visit: Payer: Self-pay

## 2021-04-07 DIAGNOSIS — B20 Human immunodeficiency virus [HIV] disease: Secondary | ICD-10-CM | POA: Diagnosis not present

## 2021-04-07 DIAGNOSIS — B376 Candidal endocarditis: Secondary | ICD-10-CM | POA: Diagnosis not present

## 2021-04-07 NOTE — Telephone Encounter (Signed)
Patient's sister texted a staff member at the facility and requested that labs be faxed to our office.  ? ?Labs were unable to be drawn in office today. Orders for HIV RNA, CD4, CMP, CBC, and blood culture x 2 were sent back to [facility] with the patient with request that results be faxed to our office.  ? ?Beryle Flock, RN ? ?

## 2021-04-07 NOTE — Assessment & Plan Note (Signed)
Recheck his CD4 and HIV RNA today.  ?Will see him back in 1 month.  ?Ask pharm to help with lab monitoring.  ?

## 2021-04-07 NOTE — Assessment & Plan Note (Signed)
He is better, less confused ?Will repeat his labs today- his K was low at d/c.  ?Will repeat his BCx as well.  ?See him back in 1 month.  ? ?

## 2021-04-07 NOTE — Progress Notes (Signed)
? ?Subjective:  ? ? Patient ID: [name] [name], male  DOB: 1960-11-22, 61 y.o.        MRN: [id number] ? ? ?HPI ?61 yo M with hx of HIV/AIDS, rectal CA, adm to [facility] on [facility]-4 after being found down. He was found to have group B streptococcal bacteremia. 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 He initially required pressors. ?He had TEE (-). ?He also has a R THR (was previously on suppressive anbx). He had aspiration of his hip while in hospital which did not grow bacteria but did show 31,570 WBC. ?He was treated with Ceftriaxone IVPB to end on 06-28-20. ?His ART was restarted in hospital (Prezcobix/edurant/tivicay qday). He was also restarted on bactrim. ?  ?SNF is concerned that area around sacral decub is red and irritated at his prev episode. 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 No drainable soft tissue abscess at this time. ? -Feathery intramuscular edema within the piriformis muscles and hip ?adductors bilaterally, likely reactive. ?He was also found to have wound on his L foot- plain films- no osteo.  ?He had PIC 9-3 at SNF and anbx started (vanco/zosyn) on 09-12-20.  ?  ?He has a wound vac on his L foot and a dressing on his sacral wound.  ?Since starting on anbx he has had mild nausea and "gas". Has also had loose BM (watery). Has been eating well, gained was 131.7 2 days ago. (Up 2.5 #).  ?(08-14-20) CRP 20.3 ?  ?He was then adm to [facility] on [facility] with ARF (felt to be due to vanco) as well as encephalopathy (due to baclofen and metoclopramide). His baclofen dose was reduced, his metaclopramide was stopped, his anbx were changed to augmentin and doxy, end date 10-25-20.  ?  ?At visit 10-24-20: He c/o sore throat. And was given rx for diflucan.   ?Wound care is being done at SNF, he is not able to go to [facility].  ?He has air mattress.  ?He had labs which showed Cr 2.48  CO2 15.  ?His repeat yesterday  was 1.80. CO2 17. H/H 8.8/26.4. C diff (-).  ?  ?Flu vax 10-14-20 at SNF.  ?  ?11-04-20: "I feel ok". "It's the same as last time I was here". He was started on diflucan for thrush.   ?12-09-20: "I'm getting sick a lot" emesis. Family notes he has burping (? Reflux") a lot. He had an x-ray of his stomach. Was found to have "gas and stool". (Plain film- may represent ileus).  ?He had arterial studies in his LE and was found to have severe PVD R, mild PVD L, bilateral CFV DVT. He is on eliquis.  ?Cr 1.7 (11-24-20) ?  ?02-17-21 ?Today is here with his sister.  ?He has had visits with palliative care- DNR but o/w continue current care.  ?He has been seen in ED as well for urinary retention of his catheter. This was replaced. Has been seen by urology as well. He didn't want suprapubic catheter.  ?The catheter was left in place due to concerns about his having urinary incont and this getting into his wound/decubitus.   ?He is awaiting a new surgeon to eval his wound. No change in his treatment rx, or his wound (per his sister).  ?He is doing well at [facility] having wound pain with prolonged sitting. He has gotten lidocaine  patches and other rx (which has not processed yet).  ?Wt 131.5 last at [facility].  ?  ?03-17-21 ?He is here today stating he got life flighted back from [place] due to a job change.  ?His sister states he has had lots of delusions. He was treated with Ceftriaxone for a presumed UTI. He also got another "UTI" and was given IVF.  ?Has had worsening confusion.  ?Has had blood in stool per his sisters report but they are unsure how much. He was to be sent to GI but this is pending.  ?Orientation- person/DOB+, place (-), date (Thursday, Feb, 2023).  ?I sent him to the hospital where he was found to have C albicans bacteremia (R-flucon), TEE: LV EF 55-60%, significant MR. 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   ?He was treated with fluconazole until his sensi returned resistant this. He is on ceftriaxone IV as well as anidulafungin (247m daily).  ?He was d/c home on 03-23-21. End date 06-01-21.  ? ?I asked him if he is in [place] and he states he was this AM. "I thought today, all day was Friday".  ?Knows he is at Dr HAlgis Downsoffice. March, 2023.  ?Still has bladder cath (thinks it is out).  ?Feels like his wounds are getting better.  ?No problems with PIC line. No erythema at site.  ?They are not clear if he is getting labs.  ?There have been changes in staffing (MD and PA) at his SNF.  ? ?DTGV/RPV/DRVc ?HIV 1 RNA Quant  ?Date Value  ?10/10/2020 120 copies/mL  ?07/20/2020 73 Copies/mL (H)  ?06/17/2020 152 copies/mL (H)  ? ?CD4 T Cell Abs (/uL)  ?Date Value  ?03/18/2021 487  ?10/10/2020 167 (L)  ?07/20/2020 334 (L)  ? ? ? ?Health Maintenance  ?Topic Date Due  ? TETANUS/TDAP  Never done  ? Zoster Vaccines- Shingrix (1 of 2) Never done  ? COLONOSCOPY (Pts 45-441yrInsurance coverage will need to be confirmed)  Never done  ? INFLUENZA VACCINE  08/09/2020  ? COVID-19 Vaccine  Completed  ? Hepatitis C Screening  Completed  ? HIV Screening  Completed  ? HPV VACCINES  Aged Out  ? ? ? ? ?Review of Systems  ?Constitutional:  Negative for chills and fever.  ?Gastrointestinal:  Negative for blood in stool and constipation.  ?Psychiatric/Behavioral:  Positive for memory loss.   ? ?Please see HPI. All other systems reviewed and negative. ? ?   ?Objective:  ?Physical Exam ?Vitals reviewed.  ?HENT:  ?   Mouth/Throat:  ?   Mouth: Mucous membranes are moist.  ?   Pharynx: No oropharyngeal exudate.  ?Eyes:  ?   Extraocular Movements: Extraocular movements intact.  ?   Pupils: Pupils are equal, round, and reactive to light.  ?Cardiovascular:  ?    Rate and Rhythm: Normal rate and regular rhythm.  ?Pulmonary:  ?   Effort: Pulmonary effort is normal.  ?   Breath sounds: Normal breath sounds.  ?Abdominal:  ?   General: Bowel sounds are normal. There is no distension.  ?   Palpations: Abdomen is soft.  ?   Tenderness: There is no abdominal tenderness.  ?Musculoskeletal:  ?     Arms: ? ?   Cervical back: Neck supple.  ?  Right lower leg: No edema.  ?   Left lower leg: No edema.  ?     Feet: ? ?   Comments: PIC is clean, non-tender, no cordis.   ?Feet:  ?   Comments: R foot wound- clean, scabed. Non-draining.  ?L foot wound- dressed, clean.  ?Both feet contracted, mod hyperemia.  ? ? ?Neurological:  ?   Mental Status: He is alert.  ? ? ? ? ? ? ?   ?Assessment & Plan:  ? ?

## 2021-04-07 NOTE — Telephone Encounter (Signed)
[facility] to request labs drawn since hospital discharge be faxed to [name]. Unable to get in contact with patient's nurse. Routing to pharmacy for follow up per [name].  ? ?Phone: [phone] ? ?Beryle Flock, RN ? ?

## 2021-04-14 ENCOUNTER — Telehealth: Payer: Self-pay

## 2021-04-14 ENCOUNTER — Inpatient Hospital Stay: Payer: Medicare Other | Admitting: Infectious Diseases

## 2021-04-14 NOTE — Telephone Encounter (Signed)
Per [name] called [facility] to have repeat CBC with Diff. Spoke with Wyatt Portela, LPN who was able to take verbal order.  ?Requested results be faxed to office for review. ?P: [phone] ?Leatrice Jewels, RMA ? ?

## 2021-04-17 NOTE — Progress Notes (Signed)
?Cardiology Office Note:   ? ?Date:  04/18/2021  ? ?ID:  [name] [name], DOB 1960/04/13, MRN [id number] ? ?PCP:  [name], MD  ?Cardiologist:  None  ? ?Referring MD: [name], MD  ? ?Chief Complaint  ?Patient presents with  ? Cardiac Valve Problem  ?  Mitral regurgitation ?Candida albicans endocarditis  ? Advice Only  ?  Perianal decubiti ?Lower extremity decubitus ?HIV ?Frail and weak  ? ? ?History of Present Illness:   ? ?[name] [name] is a 61 y.o. male with a hx of candida albicans endocarditis, mitral regurgitation, HIV/AIDS, rectal cancer, sacral ulcer, chronic anemia, chronic diastolic dysfunction, palliative care, DNR, and CKD IIIa. ? ?I have reviewed the patient's record thoroughly.  The most recent records reviewed with those from [name] in the infectious disease clinic.  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 ? ?He is accompanied by his sister.  He is able to give history and she assists.  He is essentially bed ridden.  He is in a wheelchair today.  He denies orthopnea, PND, lower extremity swelling, and chest pain. ? ?He still has 2 large perirectal/anal decubiti according to his sister.  He is in a nursing facility.  They are attempting to do rehab.  He is unable to stand or walk.  He also has a decubitus on his left foot.  He is a resident at [facility]. ? ? ? ?Past Medical History:  ?Diagnosis Date  ? Arthritis   ? Decubitus ulcer of sacral area   ? DVT (deep venous thrombosis) ([place]) 2008  ? Left leg  ? Exposure to hepatitis B   ? H/O hypotension   ? History of anemia   ? History of gout   ? History of transfusion   ? History of urinary retention   ? due to radiation   ? HIV (human immunodeficiency virus infection) ([place])   ? Hx of sepsis 2015  ? affected rt hip / femur  ? Pain in limb 07/23/2013  ? PONV (postoperative nausea and vomiting)   ? Hiccups for 4 days after anesthesia  ? Radiation   ? 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 Shingles   ? left shoulder, right leg  ? ? ?Past Surgical History:  ?Procedure Laterality Date  ? COLONOSCOPY    ? CONVERSION TO TOTAL HIP Right 06/15/2014  ? Procedure: REMOVAL OF ANTIBIOTIC  SPACER AND CONVERSION TO RIGHT TOTAL HIP  ARTHROPLASTY ;  Surgeon: Paralee Cancel, MD;  [facility]: [facility];  Service: Orthopedics;  Laterality: Right;  ? CYSTOSCOPY    ? FLEXIBLE SIGMOIDOSCOPY N/A 12/17/2020  ? Procedure: FLEXIBLE SIGMOIDOSCOPY;  Surgeon: Gatha Mayer, MD;  Location: [facility];  Service: Endoscopy;  Laterality: N/A;  ? HEMATOMA EVACUATION    ? HERNIA REPAIR    ? right X2  ? INCISION AND DRAINAGE HIP Right 07/31/2013  ? Procedure: IRRIGATION AND DEBRIDEMENT HIP WITH PERCUTANEOUS DRAIN PLACEMENT;  Surgeon: [name], MD;  Location: [facility];  Service: Orthopedics;  Laterality: Right;  ? INCISION AND DRAINAGE HIP Right 03/15/2015  ? Procedure: IRRIGATION AND DEBRIDEMENT RIGHT HIP WITH  HEAD BALL  AND POLY LINER EXCHANGE;  Surgeon: Paralee Cancel, MD;  [facility]: [facility];  Service: Orthopedics;  Laterality: Right;  ? INCISION AND DRAINAGE OF WOUND N/A 08/25/2013  ? Procedure: IRRIGATION AND DEBRIDEMENT OF SACRAL ULCER WITH PLACEMENT OF A CELL ;  Surgeon: Lyndee Leo  Sanger, DO;  Location: [facility];  Service: Clinical cytogeneticist;  Laterality: N/A;  ? INCISION AND DRAINAGE OF WOUND N/A 09/01/2013  ? Procedure: IRRIGATION AND DEBRIDEMENT SACRAL ULCER WITH PLACEMENT OF A CELL;  Surgeon: Theodoro Kos, DO;  Location: [facility];  Service: Plastics;  Laterality: N/A;  ? INCISION AND DRAINAGE OF WOUND N/A 09/08/2013  ? Procedure: IRRIGATION AND DEBRIDEMENT OF SACRAL ULCER WITH PLACEMENT OF A CELL AND VAC;  Surgeon: Theodoro Kos, DO;  Location: [facility];  Service: Plastics;  Laterality: N/A;  ? INCISION AND DRAINAGE OF WOUND N/A 09/18/2013  ? Procedure: IRRIGATION AND DEBRIDEMENT OF SACRAL WOUND WITH PLACEMENT OF A-CELL;  Surgeon: Theodoro Kos, DO;  Location: [facility];  Service: Plastics;  Laterality: N/A;  ? PILONIDAL CYST DRAINAGE N/A  08/09/2013  ? Procedure: IRRIGATION AND DEBRIDEMENT SACRAL DECUBITUS;  Surgeon: Harl Bowie, MD;  Location: [facility];  Service: General;  Laterality: N/A;  ? RECTAL BIOPSY N/A 03/29/2020  ? Procedure: EXCISION OF PERIANAL TISSUE WITH BX AND 3 PUNCH BXS;  Surgeon: Ileana Roup, MD;  Location[facility];  Service: General;  Laterality: N/A;  ? TEE WITHOUT CARDIOVERSION N/A 03/22/2021  ? Procedure: TRANSESOPHAGEAL ECHOCARDIOGRAM (TEE);  Surgeon: Buford Dresser, MD;  Location: [facility];  Service: Cardiovascular;  Laterality: N/A;  ? TOTAL HIP ARTHROPLASTY Right 08/04/2013  ? Procedure: RESECTION HIP JOINT - PLACEMENT OF CEMENT PROSTHESIS;  Surgeon: [name], MD;  Location: [place];  Service: Orthopedics;  Laterality: Right;  ? TRANSURETHRAL RESECTION OF PROSTATE  JAN 2016  ? ? ?Current Medications: ?Current Meds  ?Medication Sig  ? acetaminophen (TYLENOL) 325 MG tablet Take 2 tablets (650 mg total) by mouth every 6 (six) hours as needed for mild pain (or Fever >/= 101).  ? Amino Acids-Protein Hydrolys (FEEDING SUPPLEMENT, PRO-STAT SUGAR FREE 64,) LIQD Take 30 mLs by mouth 3 (three) times daily.  ? apixaban (ELIQUIS) 2.5 MG TABS tablet Take 1 tablet (2.5 mg total) by mouth 2 (two) times daily.  ? cyanocobalamin 100 MCG tablet Take 100 mcg by mouth daily.  ? cyclobenzaprine (FLEXERIL) 10 MG tablet Take 10 mg by mouth at bedtime.  ? darunavir-cobicistat (PREZCOBIX) 800-150 MG tablet TAKE 1 TABLET BY MOUTH DAILY . SWALLOW WHOLE, DO NOT CRUSH, BREAK, OR CHEW TABLETS. TAKE WITH FOOD (Patient taking differently: Take 1 tablet by mouth daily.)  ? Dextrose-Sodium Chloride (DEXTROSE 5 % AND 0.45% NACL) infusion Inject into the vein.  ? ERAXIS 100 MG injection Inject 200 mg into the vein daily.  ? famotidine (PEPCID) 20 MG tablet Take 1 tablet (20 mg total) by mouth daily.  ? feeding supplement (ENSURE ENLIVE / ENSURE PLUS) LIQD Take 237 mLs by mouth daily.  ? fluconazole (DIFLUCAN) 200 MG tablet Take 1 tablet  (200 mg total) by mouth daily.  ? Lidocaine 4 % PTCH Apply topically. Apply to lumbar spine topically at bedtime and for pain; remove per schedule  ? nutrition supplement, JUVEN, (JUVEN) PACK Take 1 packet by mouth 2 (two) times daily between meals.  ? Nutritional Supplements (,FEEDING SUPPLEMENT, PROSOURCE PLUS) liquid Take 30 mLs by mouth 2 (two) times daily between meals.  ? oxycodone (OXY-IR) 5 MG capsule Take 1 capsule (5 mg total) by mouth See admin instructions. Give '5mg'$  by mouth daily for wound care. May also give '5mg'$  every 6 hours as needed for chronic pain  ? polycarbophil (FIBERCON) 625 MG tablet Take 625 mg by mouth daily.  ? rilpivirine (EDURANT) 25 MG TABS tablet TAKE  1 TABLET (25 MG TOTAL) BY MOUTH DAILY WITH BREAKFAST (Patient taking differently: Take 25 mg by mouth daily with breakfast.)  ? senna-docusate (SENOKOT-S) 8.6-50 MG tablet Take 2 tablets by mouth at bedtime as needed for mild constipation.  ? simethicone (MYLICON) 80 MG chewable tablet Chew 80 mg by mouth with breakfast, with lunch, and with evening meal.  ? TIVICAY 50 MG tablet TAKE 1 TABLET (50 MG TOTAL) BY MOUTH DAILY (Patient taking differently: Take 50 mg by mouth daily.)  ? vitamin C (ASCORBIC ACID) 500 MG tablet Take 1,000 mg by mouth daily.  ? zinc sulfate 220 (50 Zn) MG capsule Take 1 capsule (220 mg total) by mouth daily.  ?  ? ?Allergies:   Collagen  ? ?Social History  ? ?Socioeconomic History  ? Marital status: Single  ?  Spouse name: Not on file  ? Number of children: 0  ? Years of education: BA  ? Highest education level: Not on file  ?Occupational History  ? Occupation: disabled  ?  Employer: [employer]  ?Tobacco Use  ? Smoking status: Former  ?  Packs/day: 0.30  ?  Types: Cigarettes  ? Smokeless tobacco: Never  ?Vaping Use  ? Vaping Use: Never used  ?Substance and Sexual Activity  ? Alcohol use: Not Currently  ?  Alcohol/week: 3.0 standard drinks  ?  Types: 3 Standard drinks or equivalent per week  ? Drug use: Not  Currently  ?  Types: Marijuana  ?  Comment: occ.  ? Sexual activity: Never  ?  Partners: Male  ?  Comment: declined condoms  ?Other Topics Concern  ? Not on file  ?Social History Narrative  ? Not on file  ?

## 2021-04-18 ENCOUNTER — Encounter: Payer: Self-pay | Admitting: Interventional Cardiology

## 2021-04-18 ENCOUNTER — Ambulatory Visit (INDEPENDENT_AMBULATORY_CARE_PROVIDER_SITE_OTHER): Payer: Medicare Other | Admitting: Interventional Cardiology

## 2021-04-18 VITALS — BP 97/74 | HR 112 | Ht 74.0 in | Wt 120.0 lb

## 2021-04-18 DIAGNOSIS — I340 Nonrheumatic mitral (valve) insufficiency: Secondary | ICD-10-CM | POA: Diagnosis not present

## 2021-04-18 DIAGNOSIS — I5032 Chronic diastolic (congestive) heart failure: Secondary | ICD-10-CM

## 2021-04-18 DIAGNOSIS — B20 Human immunodeficiency virus [HIV] disease: Secondary | ICD-10-CM

## 2021-04-18 DIAGNOSIS — I82403 Acute embolism and thrombosis of unspecified deep veins of lower extremity, bilateral: Secondary | ICD-10-CM | POA: Diagnosis not present

## 2021-04-18 NOTE — Patient Instructions (Signed)
Medication Instructions:  ?Your physician recommends that you continue on your current medications as directed. Please refer to the Current Medication list given to you today. ? ?*If you need a refill on your cardiac medications before your next appointment, please call your pharmacy* ? ? ?Lab Work: ?None ?If you have labs (blood work) drawn today and your tests are completely normal, you will receive your results only by: ?MyChart Message (if you have MyChart) OR ?A paper copy in the mail ?If you have any lab test that is abnormal or we need to change your treatment, we will call you to review the results. ? ? ?Testing/Procedures: ?Your physician has requested that you have an echocardiogram 1-2 weeks prior to seeing Dr. Tamala Julian back in 6 months. Echocardiography is a painless test that uses sound waves to create images of your heart. It provides your doctor with information about the size and shape of your heart and how well your heart?s chambers and valves are working. This procedure takes approximately one hour. There are no restrictions for this procedure. ? ? ? ?Follow-Up: ?At [facility], you and your health needs are our priority.  As part of our continuing mission to provide you with exceptional heart care, we have created designated Provider Care Teams.  These Care Teams include your primary Cardiologist (physician) and Advanced Practice Providers (APPs -  Physician Assistants and Nurse Practitioners) who all work together to provide you with the care you need, when you need it. ? ?We recommend signing up for the patient portal called "MyChart".  Sign up information is provided on this After Visit Summary.  MyChart is used to connect with patients for Virtual Visits (Telemedicine).  Patients are able to view lab/test results, encounter notes, upcoming appointments, etc.  Non-urgent messages can be sent to your provider as well.   ?To learn more about what you can do with MyChart, go to  [email or web address].   ? ?Your next appointment:   ?6 month(s) ? ?The format for your next appointment:   ?In Person ? ?Provider:   ?[facility]. Blenda Bridegroom, MD  ? ? ?Other Instructions ? ? ?Important Information About Sugar ? ? ? ? ?  ?

## 2021-04-22 ENCOUNTER — Non-Acute Institutional Stay: Payer: Medicare Other | Admitting: Hospice

## 2021-04-22 DIAGNOSIS — R532 Functional quadriplegia: Secondary | ICD-10-CM

## 2021-04-22 DIAGNOSIS — L89154 Pressure ulcer of sacral region, stage 4: Secondary | ICD-10-CM

## 2021-04-22 DIAGNOSIS — G9341 Metabolic encephalopathy: Secondary | ICD-10-CM

## 2021-04-22 DIAGNOSIS — Z515 Encounter for palliative care: Secondary | ICD-10-CM

## 2021-04-22 DIAGNOSIS — E43 Unspecified severe protein-calorie malnutrition: Secondary | ICD-10-CM

## 2021-04-22 NOTE — Progress Notes (Signed)
? ? ?Manufacturing engineer ?Community Palliative Care Consult Note ?Telephone: [phone]  ?Fax: [phone] ? ?PATIENT NAME: [name] [name] ?[place] [address] ?[phone] (home)  ?DOB: 03-May-1960 ?MRN: [id number] ? ?PRIMARY CARE PROVIDER:    ?[name], MD,  ?[place] [phone] ?[place] [address] ?[phone] ? ?REFERRING PROVIDER:   ?Dr Rich Brave ? ?RESPONSIBLE PARTY:   [name] ?Contact Information   ? ? Name Relation Home Work Mobile  ? [name] Sister [phone]    ? [name] Sister [phone]    ? ?  ? ? ?I met face to face with patient at facility. Palliative Care was asked to follow this patient to address advance care planning, complex medical decision making and goals of care clarification.  This is a follow up visit. ? ?  ASSESSMENT AND / RECOMMENDATIONS:  ? ?CODE STATUS: Patient is a Do Not Resuscitate. [name] affirmed patient is a Do Not Resuscitate.  ? ?Goals of Care: Goals include to maximize quality of life and symptom management. ?Discussion with [name] on patient's worsening decline in functional status and the benefits of hospice services. [name] said family is interested in hospice service; she will further discuss with other family members for consensus.  ? ?Symptom Management/Plan: ?Acute metabolic encephalopathy: related to candidal sepsis for which patient was hospitalized 03/17/21 - 03/23/21. Treated with antibiotics, IV fluid hydration with resolution of encephalopathy, now back to his baseline mental status. Continue Eraxis Intravenous Solution Reconstituted 100 MG (Anidulafungin) as ordered through 04/29/21  ?Stage 4 Sacral decubitus ulcer: Followed by Wound NP and facility wound nurse. Chart review shows effort to reach ID because of decline of wound status. Ke[place] - wound reports wound now has more depth - 0.6cm.  Currently dressing with  Manuka honey sheets and dressing changes as ordered. Air mattress is in place,  repositioning per facility protocol. Previous hospital report indicates GI recommended consideration of hospice. ?Protein Caloric Malnutrition: Worsening with increasing weight loss - 119.5 Ibs down from 127 Ibs a month ago, Height- 6 feet 2 inches BMI 14.89, current albumin 3.3 04/08/21.   Continue Protostat and house supplement for protein/nutritional augmentation. Provide assistance during meals to ensure adequate oral intake/hydration. Nutritionist consult as needed.  ?Weakness/Functional Quadriplegia: PT/OT  ?HIV/AIDS: Managed with Tivicay and Edurant Prezcobix,  ?Follow up: Palliative care will continue to follow for complex medical decision making, advance care planning, and clarification of goals. Return 6 weeks or prn.Encouraged to call provider sooner with any concerns.  ? ?Family /Caregiver/Community Supports: Patient in SNF for ongoing care. ? ?HOSPICE ELIGIBILITY/DIAGNOSIS: TBD ? ?Chief Complaint: follow-up visit visit ? ?HISTORY OF PRESENT ILLNESS:  [name] [name] is a 61 y.o. year old male  with multiple morbidities requiring close monitoring/management with high risk of complications and morbidity: acute metabolic encephalopathy related to candidal sepsis, sever protein caloric malnutrition, Functional quadriplegia, weakness, HIV/AIDS, stage IV sacral decubitus ulcer. He denies pain/discomfort. History obtained from review of EMR, discussion with primary team, caregiver, family and/or Mr. Launer.  ?Review and summarization of Epic records shows history from other than patient. Rest of 10 point ROS asked and negative.  ?I reviewed as needed, available labs, patient records, imaging, studies and related documents from the EMR. ? ?Physical Exam: ?Height/Weight: 6 feet 2 inches/119.5 Ibs down from 127 Ibs a month ago ?Constitutional: NAD ?General: Well groomed, cooperative ?EYES: anicteric sclera, lids intact, no discharge  ?ENMT: Moist mucous membrane ?CV: S1 S2, RRR, no LE edema ?  Pulmonary: LCTA, no  increased work of breathing, no cough, ?Abdomen: active BS + 4 quadrants, soft and non tender ?GU: no suprapubic tenderness; foley cath in place, clear light yellow urine in bag ?MSK: weakness, sarcopenia, limited ROM ?Skin: warm and dry, dressing to sacrum clean dry and intact ?Neuro:  weakness, otherwise non focal ?Psych: non-anxious affect ?Hem/lymph/immuno: no widespread bruising ? ? ?PAST MEDICAL HISTORY:  ?Active Ambulatory Problems  ?  Diagnosis Date Noted  ? Human immunodeficiency virus (HIV) disease ([place]) 10/19/2005  ? MOLLUSCUM CONTAGIOSUM 10/19/2005  ? Thrush of mouth and esophagus ([place]) 02/16/2006  ? 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 GOUT 10/19/2005  ? ANEMIA, CHRONIC 03/20/2007  ? Acute thromboembolism of deep veins of lower extremity ([place]) 08/22/2006  ? ABSCESS, RECTUM 04/11/2006  ? 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 DIARRHEA 02/16/2006  ? HEPATITIS B, HX OF 10/19/2005  ? HERPES ZOSTER, HX OF 10/19/2005  ? Unspecified severe protein-calorie malnutrition ([place]) 02/14/2007  ? Enlargement of lymph nodes 01/13/2013  ? Inguinal adenopathy 02/18/2013  ? Pain in limb 07/23/2013  ? Septic arthritis of hip ([place]) 07/31/2013  ? Sacral decubitus ulcer, stage IV ([place]) 07/31/2013  ? Protein-calorie malnutrition, severe ([place]) 07/31/2013  ? Acute renal failure superimposed on stage 3a chronic kidney disease ([place]) 06/10/2014  ? Poor dentition 01/16/2017  ? Rectal bleeding 02/20/2020  ? Abnormal digital rectal exam 02/20/2020  ? History of anal cancer 02/20/2020  ? AKI (acute kidney injury) ([place]) 05/13/2020  ? Lactic acidosis 05/13/2020  ? SDH (subdural hematoma) ([place]) 05/13/2020  ? Hypothermia due to exposure 05/13/2020  ? Severe sepsis; Candidia albicans bacteremia ([place]) 05/13/2020  ? Cellulitis 05/13/2020  ? Elevated brain natriuretic peptide (BNP) level 05/13/2020  ? Volume overload 05/13/2020  ? Dehydration 05/13/2020  ? Decubitus ulcer 05/13/2020  ? Subdural empyema   ? Streptococcal bacteremia   ?  Onychomycosis 08/19/2020  ? Penile ulcer 08/19/2020  ? Acute metabolic encephalopathy 02/72/5366  ? 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 Chronic diastolic CHF (congestive heart failure) ([place]) 10/09/2020  ? Normochromic normocytic anemia 10/09/2020  ? Rigidity 10/09/2020  ? Hypernatremia 10/09/2020  ? Functional quadriplegia ([place]) 10/09/2020  ? Pressure injury of skin 10/10/2020  ? Acute encephalopathy 10/10/2020  ? Encephalopathy   ? Palliative care encounter 12/09/2020  ? Ileus ([place]) 12/09/2020  ? GIB (gastrointestinal bleeding) 12/16/2020  ? Bleeding from wound 12/19/2020  ? ABLA (acute blood loss anemia) 12/19/2020  ? Acute urinary retention 12/19/2020  ? FTT (failure to thrive) in adult 12/19/2020  ? Thrombocytopenia ([place]) 12/21/2020  ? High anion gap metabolic acidosis 44/03/4740  ? Hypercalcemia 03/17/2021  ? Nonrheumatic mitral valve regurgitation   ? Acute candidal endocarditis 03/24/2021  ? ?Resolved Ambulatory Problems  ?  Diagnosis Date Noted  ? TOBACCO USER 02/16/2006  ? Dental caries 03/20/2007  ? Septic shock(785.52) 08/03/2013  ? S/P right TH revision 06/15/2014  ? 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 ?Past Medical History:  ?Diagnosis Date  ? Arthritis   ? Decubitus ulcer of sacral area   ? DVT (deep venous thrombosis) ([place]) 2008  ? Exposure to hepatitis B   ? H/O hypotension   ? History of anemia   ? History of gout   ? History of transfusion   ? History of urinary retention   ? HIV (human immunodeficiency virus infection) ([place])   ? Hx of sepsis 2015  ? PONV (postoperative nausea and vomiting)   ? Radiation   ? Rectal cancer ([place])   ?  Shingles   ? ? ?SOCIAL HX:  ?Social History  ? ?Tobacco Use  ? Smoking status: Former  ?  Packs/day: 0.30  ?  Types: Cigarettes  ? Smokeless tobacco: Never  ?Substance Use Topics  ? Alcohol use: Not Currently  ?  Alcohol/week: 3.0 standard drinks  ?  Types: 3 Standard drinks or equivalent per week  ? ?  ?FAMILY HX:  ?Family History  ?Problem Relation Age of  Onset  ? Diabetes Mother   ? Dementia Mother   ? Atrial fibrillation Mother   ? Hypertension Mother   ? Cancer Father   ? Emphysema Father   ?   ? ?ALLERGIES:  ?Allergies  ?Allergen Reactions  ? Collagen Rash  ?

## 2021-05-12 ENCOUNTER — Ambulatory Visit (INDEPENDENT_AMBULATORY_CARE_PROVIDER_SITE_OTHER): Payer: Medicare Other | Admitting: Infectious Diseases

## 2021-05-12 ENCOUNTER — Other Ambulatory Visit: Payer: Self-pay

## 2021-05-12 ENCOUNTER — Encounter: Payer: Self-pay | Admitting: Infectious Diseases

## 2021-05-12 DIAGNOSIS — B20 Human immunodeficiency virus [HIV] disease: Secondary | ICD-10-CM | POA: Diagnosis not present

## 2021-05-12 DIAGNOSIS — L89310 Pressure ulcer of right buttock, unstageable: Secondary | ICD-10-CM | POA: Diagnosis not present

## 2021-05-12 DIAGNOSIS — I059 Rheumatic mitral valve disease, unspecified: Secondary | ICD-10-CM | POA: Diagnosis not present

## 2021-05-12 MED ORDER — VFEND 200 MG PO TABS: 200.0000 mg | ORAL_TABLET | Freq: Two times a day (BID) | ORAL | 3 refills | Status: DC

## 2021-05-12 NOTE — Assessment & Plan Note (Signed)
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  ?Will give him rx for vfend '200mg'$  bid.  ?I have asked his sister to call me if they have trouble obtaining this.  ?I will see him back in 1 month.  ?

## 2021-05-12 NOTE — Assessment & Plan Note (Signed)
Per sister slight worsening.  ?Not unexpected with his worsening nutrition.  ?

## 2021-05-12 NOTE — Progress Notes (Signed)
? ?Subjective:  ? ? Patient ID: [name] [name], male  DOB: 1960-11-22, 61 y.o.        MRN: [id number] ? ? ?HPI ?61 yo M with hx of HIV/AIDS, rectal CA, adm to [facility] on 5-4 after being found down. He was found to have group B streptococcal bacteremia. 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 He initially required pressors. ?He had TEE (-). ?He also has a R THR (was previously on suppressive anbx). He had aspiration of his hip while in hospital which did not grow bacteria but did show 31,570 WBC. ?He was treated with Ceftriaxone IVPB to end on 06-28-20. ?His ART was restarted in hospital (Prezcobix/edurant/tivicay qday). He was also restarted on bactrim. ?  ?SNF is concerned that area around sacral decub is red and irritated at his prev episode. 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 No drainable soft tissue abscess at this time. ? -Feathery intramuscular edema within the piriformis muscles and hip ?adductors bilaterally, likely reactive. ?He was also found to have wound on his L foot- plain films- no osteo.  ?He had PIC 9-3 at SNF and anbx started (vanco/zosyn) on 09-12-20.  ?  ?He has a wound vac on his L foot and a dressing on his sacral wound.  ?Since starting on anbx he has had mild nausea and "gas". Has also had loose BM (watery). Has been eating well, gained was 131.7 2 days ago. (Up 2.5 #).  ?(08-14-20) CRP 20.3 ?  ?He was then adm to [facility] on [facility]-1 with ARF (felt to be due to vanco) as well as encephalopathy (due to baclofen and metoclopramide). His baclofen dose was reduced, his metaclopramide was stopped, his anbx were changed to augmentin and doxy, end date 10-25-20.  ?  ?At visit 10-24-20: He c/o sore throat. And was given rx for diflucan.   ?Wound care is being done at SNF, he is not able to [facility] to [facility].  ?He has air mattress.  ?He had labs which showed Cr 2.48  CO2 15.  ?His repeat yesterday  was 1.80. CO2 17. H/H 8.8/26.4. C diff (-).  ?  ?Flu vax 10-14-20 at SNF.  ?  ?11-04-20: "I feel ok". "It's the same as last time I was here". He was started on diflucan for thrush.   ?12-09-20: "I'm getting sick a lot" emesis. Family notes he has burping (? Reflux") a lot. He had an x-ray of his stomach. Was found to have "gas and stool". (Plain film- may represent ileus).  ?He had arterial studies in his LE and was found to have severe PVD R, mild PVD L, bilateral CFV DVT. He is on eliquis.  ?Cr 1.7 (11-24-20) ?  ?02-17-21 ?Today is here with his sister.  ?He has had visits with palliative care- DNR but o/w continue current care.  ?He has been seen in ED as well for urinary retention of his catheter. This was replaced. Has been seen by urology as well. He didn't want suprapubic catheter.  ?The catheter was left in place due to concerns about his having urinary incont and this getting into his wound/decubitus.   ?He is awaiting a new surgeon to eval his wound. No change in his treatment rx, or his wound (per his sister).  ?He is doing well at [facility] having wound pain with prolonged sitting. He has gotten lidocaine  patches and other rx (which has not processed yet).  ?Wt 131.5 last at [facility].  ?  ?03-17-21 ?He is here today stating he got life flighted back from [place] due to a job change.  ?His sister states he has had lots of delusions. He was treated with Ceftriaxone for a presumed UTI. He also got another "UTI" and was given IVF.  ?Has had worsening confusion.  ?Has had blood in stool per his sisters report but they are unsure how much. He was to be sent to GI but this is pending.  ?Orientation- person/DOB+, place (-), date (Thursday, Feb, 2023).  ?I sent him to the hospital where he was found to have C albicans bacteremia (R-flucon), TEE: LV EF 55-60%, significant MR. 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   ?He was treated with fluconazole until his sensi returned resistant this. He is on ceftriaxone IV as well as anidulafungin (277m daily).  ?He was d/c home on 03-23-21. End date 06-01-21.  ?  ?04-07-21 ?He still has some confusion.  ?04-18-21 ?CV eval- medical rx only. Limited expectations given his overall morbidity.  ?04-22-21 ?Palliative care eval.   ? ?05-12-21 ?Arrived 2h early, (very uncomfortable by appt time, given tylenol). Very tired.  ?Per his sister, his wound care nurse states his wound is slightly worse.  ?Eating poorly, gets full 1/2 way through. Even with food from home.  ?His pic was removed this AM.  ?Per uptodate- for Candida R-flucon, Vori 2015mbid ?  ?DTGV/RPV/DRVc ?HIV 1 RNA Quant  ?Date Value  ?10/10/2020 120 copies/mL  ?07/20/2020 73 Copies/mL (H)  ?06/17/2020 152 copies/mL (H)  ? ?CD4 T Cell Abs (/uL)  ?Date Value  ?03/18/2021 487  ?10/10/2020 167 (L)  ?07/20/2020 334 (L)  ? ? ? ?Health Maintenance  ?Topic Date Due  ?? TETANUS/TDAP  Never done  ?? Zoster Vaccines- Shingrix (1 of 2) Never done  ?? COLONOSCOPY (Pts [id number] coverage will need to be confirmed)  Never done  ?? INFLUENZA VACCINE  08/09/2021  ?? COVID-19 Vaccine  Completed  ?? Hepatitis C Screening  Completed  ?? HIV Screening  Completed  ?? HPV VACCINES  Aged Out  ? ? ? ? ?Review of Systems  ?Constitutional:  Positive for weight loss. Negative for chills and fever.  ?Gastrointestinal:  Negative for constipation and diarrhea.  ?Genitourinary:   ?     Still has indwelling foley.   ? ?Please see HPI. All other systems reviewed and negative. ? ?   ?Objective:  ?Physical Exam ?Constitutional:   ?   Appearance: He is ill-appearing.  ?HENT:  ?   Mouth/Throat:  ?   Mouth: Mucous membranes are moist.  ?Eyes:  ?   Extraocular Movements:  Extraocular movements intact.  ?   Pupils: Pupils are equal, round, and reactive to light.  ?Cardiovascular:  ?   Rate and Rhythm: Normal rate and regular rhythm.  ?Pulmonary:  ?   Effort: Pulmonary effort is normal.  ?   Breath sounds: Normal breath sounds.  ?Abdominal:  ?   General: Bowel sounds are normal. There is no distension.  ?   Palpations: Abdomen is soft.  ?   Tenderness: There is no abdominal tenderness.  ?Musculoskeletal:  ?   Cervical back: Normal  range of motion and neck supple.  ?   Right lower leg: Edema present.  ?   Left lower leg: Edema present.  ?   Comments: LE contracutres ?RUE no cordis from previous pic. Site bandaged.   ?Neurological:  ?   Mental Status: He is alert.  ?   Comments: Knows he is at the medical center.  ?Knows it is May 4th.  ?  ? ? ? ? ? ?   ?Assessment & Plan:  ? ?

## 2021-05-12 NOTE — Assessment & Plan Note (Addendum)
He continues to take his ART ?Would not stop this until very end of his hospice care.  ?I spoke at length with his sister on the importance of invovling hospice/palliative care early to maximize comfort for [name].  ?I am unable to estimate his remaining time. I serriously doubt that he will live 1 year (discussed with his sister). It is possible that he could live 6 months.  ?I support making him hospice, spoke with his sister about choices to make surrounding this- whether he should return to hospital.  ?I let his sister know that I would be glad to talk to her anytime, on a conference call with family, or individually.  ?Will see him back in 1 month.  ?

## 2021-05-13 ENCOUNTER — Other Ambulatory Visit: Payer: Self-pay | Admitting: Infectious Diseases

## 2021-05-13 DIAGNOSIS — B20 Human immunodeficiency virus [HIV] disease: Secondary | ICD-10-CM

## 2021-05-20 ENCOUNTER — Emergency Department (HOSPITAL_COMMUNITY): Payer: Medicare Other

## 2021-05-20 ENCOUNTER — Inpatient Hospital Stay (HOSPITAL_COMMUNITY)
Admission: EM | Admit: 2021-05-20 | Discharge: 2021-05-24 | DRG: 974 | Disposition: A | Discharge status: Skilled Nursing Facility | Payer: Medicare Other | Source: Skilled Nursing Facility | Attending: Internal Medicine | Admitting: Internal Medicine

## 2021-05-20 ENCOUNTER — Other Ambulatory Visit: Payer: Self-pay

## 2021-05-20 ENCOUNTER — Encounter (HOSPITAL_COMMUNITY): Payer: Self-pay | Admitting: Radiology

## 2021-05-20 DIAGNOSIS — Z20822 Contact with and (suspected) exposure to covid-19: Secondary | ICD-10-CM | POA: Diagnosis present

## 2021-05-20 DIAGNOSIS — Z681 Body mass index (BMI) 19 or less, adult: Secondary | ICD-10-CM | POA: Diagnosis not present

## 2021-05-20 DIAGNOSIS — K219 Gastro-esophageal reflux disease without esophagitis: Secondary | ICD-10-CM | POA: Diagnosis present

## 2021-05-20 DIAGNOSIS — M4628 Osteomyelitis of vertebra, sacral and sacrococcygeal region: Secondary | ICD-10-CM | POA: Diagnosis present

## 2021-05-20 DIAGNOSIS — Z792 Long term (current) use of antibiotics: Secondary | ICD-10-CM

## 2021-05-20 DIAGNOSIS — N182 Chronic kidney disease, stage 2 (mild): Secondary | ICD-10-CM | POA: Diagnosis present

## 2021-05-20 DIAGNOSIS — M869 Osteomyelitis, unspecified: Secondary | ICD-10-CM

## 2021-05-20 DIAGNOSIS — Z888 Allergy status to other drugs, medicaments and biological substances status: Secondary | ICD-10-CM | POA: Diagnosis not present

## 2021-05-20 DIAGNOSIS — K5641 Fecal impaction: Secondary | ICD-10-CM | POA: Diagnosis present

## 2021-05-20 DIAGNOSIS — Z825 Family history of asthma and other chronic lower respiratory diseases: Secondary | ICD-10-CM

## 2021-05-20 DIAGNOSIS — Z86718 Personal history of other venous thrombosis and embolism: Secondary | ICD-10-CM

## 2021-05-20 DIAGNOSIS — I059 Rheumatic mitral valve disease, unspecified: Secondary | ICD-10-CM | POA: Diagnosis not present

## 2021-05-20 DIAGNOSIS — E43 Unspecified severe protein-calorie malnutrition: Secondary | ICD-10-CM | POA: Diagnosis present

## 2021-05-20 DIAGNOSIS — B379 Candidiasis, unspecified: Secondary | ICD-10-CM | POA: Diagnosis present

## 2021-05-20 DIAGNOSIS — D62 Acute posthemorrhagic anemia: Secondary | ICD-10-CM | POA: Diagnosis present

## 2021-05-20 DIAGNOSIS — Z809 Family history of malignant neoplasm, unspecified: Secondary | ICD-10-CM | POA: Diagnosis not present

## 2021-05-20 DIAGNOSIS — Z7901 Long term (current) use of anticoagulants: Secondary | ICD-10-CM | POA: Diagnosis not present

## 2021-05-20 DIAGNOSIS — R531 Weakness: Secondary | ICD-10-CM | POA: Diagnosis not present

## 2021-05-20 DIAGNOSIS — B20 Human immunodeficiency virus [HIV] disease: Principal | ICD-10-CM | POA: Diagnosis present

## 2021-05-20 DIAGNOSIS — N179 Acute kidney failure, unspecified: Secondary | ICD-10-CM | POA: Diagnosis present

## 2021-05-20 DIAGNOSIS — Z7401 Bed confinement status: Secondary | ICD-10-CM

## 2021-05-20 DIAGNOSIS — R64 Cachexia: Secondary | ICD-10-CM | POA: Diagnosis present

## 2021-05-20 DIAGNOSIS — Z79899 Other long term (current) drug therapy: Secondary | ICD-10-CM | POA: Diagnosis not present

## 2021-05-20 DIAGNOSIS — Z8249 Family history of ischemic heart disease and other diseases of the circulatory system: Secondary | ICD-10-CM

## 2021-05-20 DIAGNOSIS — Z205 Contact with and (suspected) exposure to viral hepatitis: Secondary | ICD-10-CM | POA: Diagnosis present

## 2021-05-20 DIAGNOSIS — Z66 Do not resuscitate: Secondary | ICD-10-CM | POA: Diagnosis present

## 2021-05-20 DIAGNOSIS — Z7189 Other specified counseling: Secondary | ICD-10-CM | POA: Diagnosis not present

## 2021-05-20 DIAGNOSIS — Z833 Family history of diabetes mellitus: Secondary | ICD-10-CM

## 2021-05-20 DIAGNOSIS — Z85048 Personal history of other malignant neoplasm of rectum, rectosigmoid junction, and anus: Secondary | ICD-10-CM

## 2021-05-20 DIAGNOSIS — K625 Hemorrhage of anus and rectum: Secondary | ICD-10-CM | POA: Diagnosis not present

## 2021-05-20 DIAGNOSIS — Z8679 Personal history of other diseases of the circulatory system: Secondary | ICD-10-CM | POA: Diagnosis not present

## 2021-05-20 DIAGNOSIS — B376 Candidal endocarditis: Secondary | ICD-10-CM | POA: Diagnosis present

## 2021-05-20 DIAGNOSIS — Z87891 Personal history of nicotine dependence: Secondary | ICD-10-CM

## 2021-05-20 DIAGNOSIS — L89154 Pressure ulcer of sacral region, stage 4: Secondary | ICD-10-CM | POA: Diagnosis present

## 2021-05-20 DIAGNOSIS — Z515 Encounter for palliative care: Secondary | ICD-10-CM

## 2021-05-20 DIAGNOSIS — R627 Adult failure to thrive: Secondary | ICD-10-CM | POA: Diagnosis present

## 2021-05-20 LAB — CBC WITH DIFFERENTIAL/PLATELET
Abs Immature Granulocytes: 0.03 10*3/uL (ref 0.00–0.07)
Basophils Absolute: 0.1 10*3/uL (ref 0.0–0.1)
Basophils Relative: 1 %
Eosinophils Absolute: 0.4 10*3/uL (ref 0.0–0.5)
Eosinophils Relative: 4 %
HCT: 28.2 % — ABNORMAL LOW (ref 39.0–52.0)
Hemoglobin: 8.6 g/dL — ABNORMAL LOW (ref 13.0–17.0)
Immature Granulocytes: 0 %
Lymphocytes Relative: 31 %
Lymphs Abs: 3.1 10*3/uL (ref 0.7–4.0)
MCH: 29.5 pg (ref 26.0–34.0)
MCHC: 30.5 g/dL (ref 30.0–36.0)
MCV: 96.6 fL (ref 80.0–100.0)
Monocytes Absolute: 0.5 10*3/uL (ref 0.1–1.0)
Monocytes Relative: 5 %
Neutro Abs: 5.7 10*3/uL (ref 1.7–7.7)
Neutrophils Relative %: 59 %
Platelets: 324 10*3/uL (ref 150–400)
RBC: 2.92 MIL/uL — ABNORMAL LOW (ref 4.22–5.81)
RDW: 15.9 % — ABNORMAL HIGH (ref 11.5–15.5)
WBC: 9.9 10*3/uL (ref 4.0–10.5)
nRBC: 0 % (ref 0.0–0.2)

## 2021-05-20 LAB — COMPREHENSIVE METABOLIC PANEL
ALT: 15 U/L (ref 0–44)
AST: 27 U/L (ref 15–41)
Albumin: 3.2 g/dL — ABNORMAL LOW (ref 3.5–5.0)
Alkaline Phosphatase: 80 U/L (ref 38–126)
Anion gap: 6 (ref 5–15)
BUN: 23 mg/dL — ABNORMAL HIGH (ref 6–20)
CO2: 24 mmol/L (ref 22–32)
Calcium: 10.6 mg/dL — ABNORMAL HIGH (ref 8.9–10.3)
Chloride: 107 mmol/L (ref 98–111)
Creatinine, Ser: 1.51 mg/dL — ABNORMAL HIGH (ref 0.61–1.24)
GFR, Estimated: 53 mL/min — ABNORMAL LOW (ref >60)
Glucose, Bld: 100 mg/dL — ABNORMAL HIGH (ref 70–99)
Potassium: 4.2 mmol/L (ref 3.5–5.1)
Sodium: 137 mmol/L (ref 135–145)
Total Bilirubin: 0.5 mg/dL (ref 0.3–1.2)
Total Protein: 7.8 g/dL (ref 6.5–8.1)

## 2021-05-20 LAB — URINALYSIS, ROUTINE W REFLEX MICROSCOPIC
Bacteria, UA: NONE SEEN
Bilirubin Urine: NEGATIVE
Glucose, UA: NEGATIVE mg/dL
Ketones, ur: NEGATIVE mg/dL
Nitrite: NEGATIVE
Protein, ur: >=300 mg/dL — AB
RBC / HPF: >50 RBC/hpf — ABNORMAL HIGH (ref 0–5)
Specific Gravity, Urine: 1.014 (ref 1.005–1.030)
pH: 6 (ref 5.0–8.0)

## 2021-05-20 LAB — LACTIC ACID, PLASMA
Lactic Acid, Venous: 2.2 mmol/L (ref 0.5–1.9)
Lactic Acid, Venous: 0.7 mmol/L (ref 0.5–1.9)

## 2021-05-20 MED ORDER — LACTATED RINGERS IV SOLN: INTRAVENOUS | Status: AC

## 2021-05-20 MED ORDER — HYDROMORPHONE HCL 1 MG/ML IJ SOLN
1.0000 mg | Freq: Once | INTRAMUSCULAR | Status: AC
  Administered 2021-05-20: 1 mg via INTRAVENOUS
  Filled 2021-05-20: qty 1

## 2021-05-20 MED ORDER — VANCOMYCIN HCL 750 MG/150ML IV SOLN
750.0000 mg | INTRAVENOUS | Status: DC
Start: 2021-05-21 — End: 2021-05-23
  Administered 2021-05-21 – 2021-05-22 (×2): 750 mg via INTRAVENOUS
  Filled 2021-05-20 (×2): qty 150

## 2021-05-20 MED ORDER — PIPERACILLIN-TAZOBACTAM 3.375 G IVPB 30 MIN: 3.3750 g | Freq: Three times a day (TID) | INTRAVENOUS | Status: DC

## 2021-05-20 MED ORDER — PIPERACILLIN-TAZOBACTAM 3.375 G IVPB 30 MIN
3.3750 g | Freq: Once | INTRAVENOUS | Status: AC
Start: 2021-05-20 — End: 2021-05-20
  Administered 2021-05-20: 3.375 g via INTRAVENOUS
  Filled 2021-05-20: qty 50

## 2021-05-20 MED ORDER — VANCOMYCIN HCL IN DEXTROSE 1-5 GM/200ML-% IV SOLN
1000.0000 mg | Freq: Once | INTRAVENOUS | Status: AC
  Administered 2021-05-20: 1000 mg via INTRAVENOUS
  Filled 2021-05-20: qty 200

## 2021-05-20 MED ORDER — SODIUM CHLORIDE 0.9 % IV BOLUS
1000.0000 mL | Freq: Once | INTRAVENOUS | Status: AC
  Administered 2021-05-20: 1000 mL via INTRAVENOUS

## 2021-05-20 MED ORDER — METRONIDAZOLE 500 MG/100ML IV SOLN
500.0000 mg | Freq: Two times a day (BID) | INTRAVENOUS | Status: DC
  Administered 2021-05-21 – 2021-05-23 (×5): 500 mg via INTRAVENOUS
  Filled 2021-05-20 (×5): qty 100

## 2021-05-20 MED ORDER — ENOXAPARIN SODIUM 40 MG/0.4ML IJ SOSY
40.0000 mg | PREFILLED_SYRINGE | INTRAMUSCULAR | Status: DC
  Administered 2021-05-20: 40 mg via SUBCUTANEOUS
  Filled 2021-05-20: qty 0.4

## 2021-05-20 MED ORDER — VANCOMYCIN HCL IN DEXTROSE 1-5 GM/200ML-% IV SOLN: 1000.0000 mg | Freq: Two times a day (BID) | INTRAVENOUS | Status: DC

## 2021-05-20 MED ORDER — SODIUM CHLORIDE 0.9 % IV SOLN
2.0000 g | Freq: Two times a day (BID) | INTRAVENOUS | Status: DC
  Administered 2021-05-21 – 2021-05-23 (×5): 2 g via INTRAVENOUS
  Filled 2021-05-20 (×6): qty 12.5

## 2021-05-20 MED ORDER — HYDROMORPHONE HCL 1 MG/ML IJ SOLN
1.0000 mg | INTRAMUSCULAR | Status: DC | PRN
Start: 2021-05-20 — End: 2021-05-25
  Administered 2021-05-21 – 2021-05-24 (×5): 1 mg via INTRAVENOUS
  Filled 2021-05-20 (×5): qty 1

## 2021-05-20 NOTE — ED Provider Notes (Signed)
?[facility] DEPT ?Provider Note ? ? ?CSN: [id number] ?Arrival date & time: 05/20/21  1831 ? ?  ? ?History ? ?Chief Complaint  ?Patient presents with  ? Wound Check  ? ? ?[name] [name] is a 61 y.o. male history of HIV, endocarditis, osteomyelitis, here presenting with bleeding from the sacral decub ulcer.  Patient is from [place].  Patient was noted to have blood around his stools today. Patient is no longer on blood thinners.  Patient has had a long and complicated history of endocarditis and osteomyelitis.  Patient is no longer on IV antibiotics.  He recently had a yeast infection so is on Vantin.  Patient has been followed with infectious disease.  Per the most recent infectious disease note and as well as palliative care note, they are thinking of inpatient hospice at this point.  However family is not in complete agreement yet so he is still not in hospice facility.  ? ?The history is provided by the patient.  ? ?  ? ?Home Medications ?Prior to Admission medications   ?Medication Sig Start Date End Date Taking? Authorizing Provider  ?acetaminophen (TYLENOL) 325 MG tablet Take 2 tablets (650 mg total) by mouth every 6 (six) hours as needed for mild pain (or Fever >/= 101). 12/23/20   Kerney Elbe, DO  ?Amino Acids-Protein Hydrolys (FEEDING SUPPLEMENT, PRO-STAT SUGAR FREE 64,) LIQD Take 30 mLs by mouth 3 (three) times daily.    [provider]  ?apixaban (ELIQUIS) 2.5 MG TABS tablet Take 1 tablet (2.5 mg total) by mouth 2 (two) times daily. 12/23/20   Raiford Noble Latif, DO  ?cyanocobalamin 100 MCG tablet Take 100 mcg by mouth daily.    [provider]  ?cyclobenzaprine (FLEXERIL) 10 MG tablet Take 10 mg by mouth at bedtime. 02/09/21   [provider]  ?darunavir-cobicistat (PREZCOBIX) 800-150 MG tablet TAKE 1 TABLET BY MOUTH DAILY . SWALLOW WHOLE DO NOT CRUSH, BREAK OR CHEW TABLETS WITH FOOD. 05/13/21   [name], MD  ?Dextrose-Sodium  Chloride (DEXTROSE 5 % AND 0.45% NACL) infusion Inject into the vein. ?Patient not taking: Reported on 05/12/2021 03/08/21   [provider]  ?dolutegravir (TIVICAY) 50 MG tablet Take 1 tablet (50 mg total) by mouth daily. 05/13/21   [name], MD  ?famotidine (PEPCID) 20 MG tablet Take 1 tablet (20 mg total) by mouth daily. 12/23/20   Raiford Noble Latif, DO  ?feeding supplement (ENSURE ENLIVE / ENSURE PLUS) LIQD Take 237 mLs by mouth daily. 12/23/20   Kerney Elbe, DO  ?Lidocaine 4 % PTCH Apply topically. Apply to lumbar spine topically at bedtime and for pain; remove per schedule    [provider]  ?nutrition supplement, JUVEN, (JUVEN) PACK Take 1 packet by mouth 2 (two) times daily between meals. 12/23/20   Kerney Elbe, DO  ?Nutritional Supplements (,FEEDING SUPPLEMENT, PROSOURCE PLUS) liquid Take 30 mLs by mouth 2 (two) times daily between meals. 12/23/20   Raiford Noble Latif, DO  ?oxycodone (OXY-IR) 5 MG capsule Take 1 capsule (5 mg total) by mouth See admin instructions. Give '5mg'$  by mouth daily for wound care. May also give '5mg'$  every 6 hours as needed for chronic pain 03/23/21   [place] Donnamarie Poag, DO  ?polycarbophil (FIBERCON) 625 MG tablet Take 625 mg by mouth daily.    [provider]  ?rilpivirine (EDURANT) 25 MG TABS tablet TAKE 1 TABLET BY MOUTH DAILY WITH BREAKFAST 05/13/21   [name],  MD  ?senna-docusate (SENOKOT-S) 8.6-50 MG tablet Take 2 tablets by mouth at bedtime as needed for mild constipation.    [provider]  ?simethicone (MYLICON) 80 MG chewable tablet Chew 80 mg by mouth with breakfast, with lunch, and with evening meal.    [provider]  ?VFEND 200 MG tablet Take 1 tablet (200 mg total) by mouth 2 (two) times daily. 05/12/21   [name], MD  ?vitamin C (ASCORBIC ACID) 500 MG tablet Take 1,000 mg by mouth daily.    [provider]  ?zinc sulfate 220 (50 Zn) MG capsule Take 1 capsule (220 mg total) by  mouth daily. 12/24/20   Kerney Elbe, DO  ?   ? ?Allergies    ?Collagen   ? ?Review of Systems   ?Review of Systems  ?Musculoskeletal:  Positive for back pain.  ?All other systems reviewed and are negative. ? ?Physical Exam ?Updated Vital Signs ?BP 114/81 (BP Location: Right Arm)   Pulse 88   Temp 98 ?F (36.7 ?C) (Oral)   Resp 19   Ht '6\' 2"'$  (1.88 m)   Wt 54.4 kg   SpO2 96%   BMI 15.41 kg/m?  ?Physical Exam ?Vitals and nursing note reviewed.  ?Constitutional:   ?   Comments: Cachectic, chronically ill  ?HENT:  ?   Head: Normocephalic.  ?   Nose: Nose normal.  ?   Mouth/Throat:  ?   Mouth: Mucous membranes are dry.  ?Eyes:  ?   Extraocular Movements: Extraocular movements intact.  ?   Pupils: Pupils are equal, round, and reactive to light.  ?Cardiovascular:  ?   Rate and Rhythm: Normal rate and regular rhythm.  ?   Pulses: Normal pulses.  ?   Heart sounds: Normal heart sounds.  ?Pulmonary:  ?   Effort: Pulmonary effort is normal.  ?   Breath sounds: Normal breath sounds.  ?Abdominal:  ?   General: Abdomen is flat.  ?   Palpations: Abdomen is soft.  ?   Comments: Multiple abdominal scars  ?Genitourinary: ?   Comments: Patient has a stool impaction. ?Musculoskeletal:  ?   Cervical back: Normal range of motion and neck supple.  ?   Comments: Patient has large stage IV decub ulcer with some bleeding   ?Skin: ?   General: Skin is warm.  ?   Capillary Refill: Capillary refill takes less than 2 seconds.  ?   Comments: Stage IV sacral decub ulcer  ?Neurological:  ?   General: No focal deficit present.  ?   Mental Status: He is oriented to person, place, and time.  ?Psychiatric:     ?   Mood and Affect: Mood normal.     ?   Behavior: Behavior normal.  ? ? ? ?ED Results / Procedures / Treatments   ?Labs ?(all labs ordered are listed, but only abnormal results are displayed) ?Labs Reviewed  ?CULTURE, BLOOD (ROUTINE X 2)  ?CULTURE, BLOOD (ROUTINE X 2)  ?URINE CULTURE  ?CBC WITH DIFFERENTIAL/PLATELET  ?COMPREHENSIVE  METABOLIC PANEL  ?LACTIC ACID, PLASMA  ?LACTIC ACID, PLASMA  ?URINALYSIS, ROUTINE W REFLEX MICROSCOPIC  ? ? ?EKG ?None ? ?Radiology ?No results found. ? ?Procedures ?Procedures  ? ? ?Medications Ordered in ED ?Medications  ?sodium chloride 0.9 % bolus 1,000 mL (has no administration in time range)  ?HYDROmorphone (DILAUDID) injection 1 mg (has no administration in time range)  ?vancomycin (VANCOCIN) IVPB 1000 mg/200 mL premix (has no administration in time range)  ?  piperacillin-tazobactam (ZOSYN) IVPB 3.375 g (has no administration in time range)  ? ? ?ED Course/ Medical Decision Making/ A&P ?  ?                        ?Medical Decision Making ?[name] [name] is a 61 y.o. male here presenting with bleeding from the sacral decub ulcer.  Patient has known chronic osteomyelitis.  I discussed with them about his preferences.  Patient states that right now he just wants comfort care.  He does not want any surgery.  Patient does have a Foley catheter that appears to be old and appears to have a lot of sedimentation.  We will have the nurse swap out the catheter.  We will do sepsis work-up and get pelvis x-ray and also give pain medicine. ? ?9:40 PM ?I reviewed his labs and imaging dependently.  White blood cell count is normal and lactate is 2.2.  UA showed possible UTI but patient has previous yeast infection.  X-ray shows no obvious osteomyelitis.  Clinically he does have osteomyelitis and is a chronic problem.  At this point hospitalist to admit for pain control and IV antibiotics and goals of care discussion.  Patient would benefit from inpatient hospice.  ? ?Problems Addressed: ?Osteomyelitis, unspecified site, unspecified type [facility]): acute illness or injury ? ?Amount and/or Complexity of Data Reviewed ?Labs: ordered. Decision-making details documented in ED Course. ?Radiology: ordered and independent interpretation performed. Decision-making details documented in ED Course. ? ?Risk ?Prescription drug  management. ?Decision regarding hospitalization. ? ? ?Final Clinical Impression(s) / ED Diagnoses ?Final diagnoses:  ?None  ? ? ?Rx / DC Orders ?ED Discharge Orders   ? ? None  ? ?  ? ? ?  ?Drenda Freeze, MD ?05/20/21 214

## 2021-05-20 NOTE — H&P (Addendum)
?History and Physical ? ?[name] [name] TAV:[id number] DOB: November 14, 1960 DOA: 05/20/2021 ? ?Referring physician: Dr. Darl Householder, [place]  ?PCP: [name], MD  ?Outpatient Specialists: ID ?Patient coming from: SNF ? ?Chief Complaint: Sacral decubitus ulcer with history of osteomyelitis of the sacrum S1 S5 ? ?HPI: [name] [name] is a 61 y.o. male with medical history significant for HIV/AIDS on HAART, recent admission in March 2023 for candida albicans bacteremia (on therapy with end date 06/01/21), history of subdural hematoma (05/13/2020), osteomyelitis of the sacrum at S4 and S5, prior group B streptococcus bacteremia, chronic urinary retention with indwelling Foley catheter, recent candidal endocarditis of mitral valve, DVT on Eliquis, who presented to [facility] ED from SNF due to sacral pain and rectal bleed noted today.  Upon examination in the ED, he was found to have severe excoriation around his sacral decubitus ulcer that was bleeding.  Hemoglobin was noted to be 8.6K.  The patient was started on IV vancomycin, cefepime and IV Flagyl empirically in the ED for his known osteomyelitis.  He received 1 L IV fluid bolus NS.  Unclear of last dose of home Eliquis.  TRH, hospitalist team, was asked to admit. ? ?ED Course: Tmax 98 degrees.  BP 106/88, pulse 81, respiration rate 19, O2 saturation 100% on room air.  Lab studies remarkable for hemoglobin 8.6, MCV 96.  BUN 23, creatinine 1.51, GFR 53, at baseline. ? ?Review of Systems: ?Review of systems as noted in the HPI. All other systems reviewed and are negative. ? ? ?Past Medical History:  ?Diagnosis Date  ? Arthritis   ? Decubitus ulcer of sacral area   ? DVT (deep venous thrombosis) ([place]) 2008  ? Left leg  ? Exposure to hepatitis B   ? H/O hypotension   ? History of anemia   ? History of gout   ? History of transfusion   ? History of urinary retention   ? due to radiation   ? HIV (human immunodeficiency virus infection) ([place])   ? Hx of sepsis 2015  ? affected rt hip / femur   ? Pain in limb 07/23/2013  ? PONV (postoperative nausea and vomiting)   ? Hiccups for 4 days after anesthesia  ? Radiation   ? Rectal cancer ([place])   ? Squamous cell  ? Shingles   ? left shoulder, right leg  ? ?Past Surgical History:  ?Procedure Laterality Date  ? COLONOSCOPY    ? CONVERSION TO TOTAL HIP Right 06/15/2014  ? Procedure: REMOVAL OF ANTIBIOTIC  SPACER AND CONVERSION TO RIGHT TOTAL HIP  ARTHROPLASTY ;  Surgeon: Paralee Cancel, MD;  Location: [facility];  Service: Orthopedics;  Laterality: Right;  ? CYSTOSCOPY    ? FLEXIBLE SIGMOIDOSCOPY N/A 12/17/2020  ? Procedure: FLEXIBLE SIGMOIDOSCOPY;  Surgeon: Gatha Mayer, MD;  Location: [facility];  Service: Endoscopy;  Laterality: N/A;  ? HEMATOMA EVACUATION    ? HERNIA REPAIR    ? right X2  ? INCISION AND DRAINAGE HIP Right 07/31/2013  ? Procedure: IRRIGATION AND DEBRIDEMENT HIP WITH PERCUTANEOUS DRAIN PLACEMENT;  Surgeon: [name], MD;  Location: [place];  Service: Orthopedics;  Laterality: Right;  ? INCISION AND DRAINAGE HIP Right 03/15/2015  ? Procedure: IRRIGATION AND DEBRIDEMENT RIGHT HIP WITH  HEAD BALL  AND POLY LINER EXCHANGE;  Surgeon: Paralee Cancel, MD;  Location: [facility];  Service: Orthopedics;  Laterality: Right;  ? INCISION AND DRAINAGE OF WOUND N/A 08/25/2013  ? Procedure: IRRIGATION AND DEBRIDEMENT OF SACRAL ULCER WITH  PLACEMENT OF A CELL ;  Surgeon: Theodoro Kos, DO;  Location: [place];  Service: Plastics;  Laterality: N/A;  ? INCISION AND DRAINAGE OF WOUND N/A 09/01/2013  ? Procedure: IRRIGATION AND DEBRIDEMENT SACRAL ULCER WITH PLACEMENT OF A CELL;  Surgeon: Theodoro Kos, DO;  Location: [place];  Service: Plastics;  Laterality: N/A;  ? INCISION AND DRAINAGE OF WOUND N/A 09/08/2013  ? Procedure: IRRIGATION AND DEBRIDEMENT OF SACRAL ULCER WITH PLACEMENT OF A CELL AND VAC;  Surgeon: Theodoro Kos, DO;  Location: [place];  Service: Plastics;  Laterality: N/A;  ? INCISION AND DRAINAGE OF WOUND N/A 09/18/2013  ? Procedure: IRRIGATION AND DEBRIDEMENT OF SACRAL  WOUND WITH PLACEMENT OF A-CELL;  Surgeon: Theodoro Kos, DO;  Location: [place];  Service: Plastics;  Laterality: N/A;  ? PILONIDAL CYST DRAINAGE N/A 08/09/2013  ? Procedure: IRRIGATION AND DEBRIDEMENT SACRAL DECUBITUS;  Surgeon: Harl Bowie, MD;  Location: [place];  Service: General;  Laterality: N/A;  ? RECTAL BIOPSY N/A 03/29/2020  ? Procedure: EXCISION OF PERIANAL TISSUE WITH BX AND 3 PUNCH BXS;  Surgeon: Ileana Roup, MD;  Location: [facility];  Service: General;  Laterality: N/A;  ? TEE WITHOUT CARDIOVERSION N/A 03/22/2021  ? Procedure: TRANSESOPHAGEAL ECHOCARDIOGRAM (TEE);  Surgeon: Buford Dresser, MD;  [facility];  Service: Cardiovascular;  Laterality: N/A;  ? TOTAL HIP ARTHROPLASTY Right 08/04/2013  ? Procedure: RESECTION HIP JOINT - PLACEMENT OF CEMENT PROSTHESIS;  Surgeon: [name], MD;  Location: [place];  Service: Orthopedics;  Laterality: Right;  ? TRANSURETHRAL RESECTION OF PROSTATE  JAN 2016  ? ? ?Social History:  reports that he has quit smoking. His smoking use included cigarettes. He smoked an average of .3 packs per day. He has never used smokeless tobacco. He reports that he does not currently use alcohol after a past usage of about 3.0 standard drinks per week. He reports that he does not currently use drugs after having used the following drugs: Marijuana. ? ? ?Allergies  ?Allergen Reactions  ? Collagen Rash  ?  redness  ? ? ?Family History  ?Problem Relation Age of Onset  ? Diabetes Mother   ? Dementia Mother   ? Atrial fibrillation Mother   ? Hypertension Mother   ? Cancer Father   ? Emphysema Father   ?  ? ? ?Prior to Admission medications   ?Medication Sig Start Date End Date Taking? Authorizing Provider  ?acetaminophen (TYLENOL) 325 MG tablet Take 2 tablets (650 mg total) by mouth every 6 (six) hours as needed for mild pain (or Fever >/= 101). 12/23/20  Yes Raiford Noble [place], DO  ?Amino Acids-Protein Hydrolys (FEEDING SUPPLEMENT, PRO-STAT SUGAR FREE 64,) LIQD  Take 30 mLs by mouth 3 (three) times daily.   Yes [provider]  ?apixaban (ELIQUIS) 2.5 MG TABS tablet Take 1 tablet (2.5 mg total) by mouth 2 (two) times daily. 12/23/20  Yes Sheikh, Omair Latif, DO  ?cyanocobalamin 100 MCG tablet Take 100 mcg by mouth daily.   Yes [provider]  ?cyclobenzaprine (FLEXERIL) 10 MG tablet Take 10 mg by mouth at bedtime. 02/09/21  Yes [provider]  ?darunavir-cobicistat (PREZCOBIX) 800-150 MG tablet TAKE 1 TABLET BY MOUTH DAILY . SWALLOW WHOLE DO NOT CRUSH, BREAK OR CHEW TABLETS WITH FOOD. ?Patient taking differently: 1 tablet daily. 05/13/21  Yes [name], MD  ?dolutegravir (TIVICAY) 50 MG tablet Take 1 tablet (50 mg total) by mouth daily. 05/13/21  Yes [name], MD  ?famotidine (  PEPCID) 20 MG tablet Take 1 tablet (20 mg total) by mouth daily. 12/23/20  Yes Raiford Noble [place], DO  ?Lidocaine 4 % PTCH Apply 1 patch topically daily. Apply to lumbar spine topically at bedtime and for pain; remove per schedule   Yes [provider]  ?NON FORMULARY Take 237 mLs by mouth 2 (two) times daily. House supplement for wound healing   Yes [provider]  ?oxycodone (OXY-IR) 5 MG capsule Take 1 capsule (5 mg total) by mouth See admin instructions. Give '5mg'$  by mouth daily for wound care. May also give '5mg'$  every 6 hours as needed for chronic pain ?Patient taking differently: Take 5 mg by mouth every 6 (six) hours as needed for pain. 03/23/21  Yes [place] Donnamarie Poag, DO  ?polycarbophil (FIBERCON) 625 MG tablet Take 625 mg by mouth daily.   Yes [provider]  ?rilpivirine (EDURANT) 25 MG TABS tablet TAKE 1 TABLET BY MOUTH DAILY WITH BREAKFAST ?Patient taking differently: Take 25 mg by mouth daily with breakfast. TAKE 1 TABLET BY MOUTH DAILY WITH BREAKFAST 05/13/21  Yes [name], MD  ?senna-docusate (SENOKOT-S) 8.6-50 MG tablet Take 2 tablets by mouth at bedtime as needed for mild constipation.   Yes [provider]  ?simethicone (MYLICON) 80 MG chewable tablet Chew 80 mg by mouth with breakfast, with lunch, and with evening meal.   Yes [provider]  ?VFEND 200 MG tablet Take 1 tablet (200 mg total)

## 2021-05-20 NOTE — ED Triage Notes (Signed)
Pt arrived with complaints from facility that pt had a rectal bleed. Upon inspection pt has severe excoriation around a sacral wound that is bleeding. MD at bedside. ?

## 2021-05-20 NOTE — Progress Notes (Signed)
Pharmacy Antibiotic Note ? ?[name] [name] is a 61 y.o. male admitted on 05/20/2021 with cellulitis/sacral decub ulcer.  PMH significant for HIV, endocarditis, and osteomyelitis.  Pharmacy has been consulted for Vancomycin and Zosyn dosing.  Patient with SCr = 1.51 and CrCl ~ 41 ml/min. Contacted Dr Nevada Crane and received orders to change Zosyn to Cefepime + Metronidazole to avoid additional nephrotoxic medication. ? ?Patient received Vancomycin 1gm IV and Zosyn 3.375gm IV x 1 dose each in the ED. ? ?Plan: ?Vancomycin 750 mg IV Q 24 hrs. Goal AUC 400-550.  Expected AUC: 503.4  SCr used: 1.51 ?Cefepime 2gm IV q12h ?Metronidazole '500mg'$  IV q12h ?Follow renal function ?F/u culture results and sensitivies ? ?Height: '6\' 2"'$  (188 cm) ?Weight: 54.4 kg (120 lb) ?IBW/kg (Calculated) : 82.2 ? ?Temp (24hrs), Avg:98 ?F (36.7 ?C), Min:98 ?F (36.7 ?C), Max:98 ?F (36.7 ?C) ? ?Recent Labs  ?Lab 05/20/21 ?1903 05/20/21 ?2055  ?WBC 9.9  --   ?CREATININE 1.51*  --   ?LATICACIDVEN 2.2* 0.7  ?  ?Estimated Creatinine Clearance: 40.5 mL/min (A) (by C-G formula based on SCr of 1.51 mg/dL (H)).   ? ?Allergies  ?Allergen Reactions  ? Collagen Rash  ?  redness  ? ? ?Antimicrobials this admission: ?5/12 Zosyn x1  ?5/12 Vancomycin >>   ?5/13 Cefepime >> ?5/13 Metronidazole >. ? ?Dose adjustments this admission: ?  ? ?Microbiology results: ?5/12 BCx:   ?5/12 UCx:    ?  ? ?Thank you for allowing pharmacy to be a part of this patient?s care. ? ?Everette Rank, PharmD ?05/20/2021 10:13 PM ? ?

## 2021-05-21 DIAGNOSIS — M4628 Osteomyelitis of vertebra, sacral and sacrococcygeal region: Secondary | ICD-10-CM

## 2021-05-21 DIAGNOSIS — L89154 Pressure ulcer of sacral region, stage 4: Secondary | ICD-10-CM

## 2021-05-21 DIAGNOSIS — K625 Hemorrhage of anus and rectum: Secondary | ICD-10-CM | POA: Diagnosis not present

## 2021-05-21 DIAGNOSIS — E43 Unspecified severe protein-calorie malnutrition: Secondary | ICD-10-CM

## 2021-05-21 DIAGNOSIS — I059 Rheumatic mitral valve disease, unspecified: Secondary | ICD-10-CM

## 2021-05-21 DIAGNOSIS — B20 Human immunodeficiency virus [HIV] disease: Secondary | ICD-10-CM

## 2021-05-21 LAB — COMPREHENSIVE METABOLIC PANEL
ALT: 12 U/L (ref 0–44)
AST: 26 U/L (ref 15–41)
Albumin: 2.7 g/dL — ABNORMAL LOW (ref 3.5–5.0)
Alkaline Phosphatase: 69 U/L (ref 38–126)
Anion gap: 5 (ref 5–15)
BUN: 20 mg/dL (ref 6–20)
CO2: 24 mmol/L (ref 22–32)
Calcium: 10 mg/dL (ref 8.9–10.3)
Chloride: 106 mmol/L (ref 98–111)
Creatinine, Ser: 1.45 mg/dL — ABNORMAL HIGH (ref 0.61–1.24)
GFR, Estimated: 56 mL/min — ABNORMAL LOW (ref >60)
Glucose, Bld: 89 mg/dL (ref 70–99)
Potassium: 3.9 mmol/L (ref 3.5–5.1)
Sodium: 135 mmol/L (ref 135–145)
Total Bilirubin: 0.5 mg/dL (ref 0.3–1.2)
Total Protein: 6.7 g/dL (ref 6.5–8.1)

## 2021-05-21 LAB — CBC WITH DIFFERENTIAL/PLATELET
Abs Immature Granulocytes: 0.03 10*3/uL (ref 0.00–0.07)
Basophils Absolute: 0.1 10*3/uL (ref 0.0–0.1)
Basophils Relative: 1 %
Eosinophils Absolute: 0.5 10*3/uL (ref 0.0–0.5)
Eosinophils Relative: 7 %
HCT: 22.7 % — ABNORMAL LOW (ref 39.0–52.0)
Hemoglobin: 6.8 g/dL — CL (ref 13.0–17.0)
Immature Granulocytes: 0 %
Lymphocytes Relative: 21 %
Lymphs Abs: 1.5 10*3/uL (ref 0.7–4.0)
MCH: 29.3 pg (ref 26.0–34.0)
MCHC: 30 g/dL (ref 30.0–36.0)
MCV: 97.8 fL (ref 80.0–100.0)
Monocytes Absolute: 0.5 10*3/uL (ref 0.1–1.0)
Monocytes Relative: 7 %
Neutro Abs: 4.5 10*3/uL (ref 1.7–7.7)
Neutrophils Relative %: 64 %
Platelets: 209 10*3/uL (ref 150–400)
RBC: 2.32 MIL/uL — ABNORMAL LOW (ref 4.22–5.81)
RDW: 15.9 % — ABNORMAL HIGH (ref 11.5–15.5)
WBC: 7.1 10*3/uL (ref 4.0–10.5)
nRBC: 0 % (ref 0.0–0.2)

## 2021-05-21 LAB — MAGNESIUM: Magnesium: 1.9 mg/dL (ref 1.7–2.4)

## 2021-05-21 LAB — RESP PANEL BY RT-PCR (FLU A&B, COVID) ARPGX2
Influenza A by PCR: NEGATIVE
Influenza B by PCR: NEGATIVE
SARS Coronavirus 2 by RT PCR: NEGATIVE

## 2021-05-21 LAB — PREPARE RBC (CROSSMATCH)

## 2021-05-21 LAB — PHOSPHORUS: Phosphorus: 3.5 mg/dL (ref 2.5–4.6)

## 2021-05-21 MED ORDER — CALCIUM POLYCARBOPHIL 625 MG PO TABS
625.0000 mg | ORAL_TABLET | Freq: Every day | ORAL | Status: DC
  Administered 2021-05-21 – 2021-05-24 (×4): 625 mg via ORAL
  Filled 2021-05-21 (×4): qty 1

## 2021-05-21 MED ORDER — DARUNAVIR-COBICISTAT 800-150 MG PO TABS
1.0000 | ORAL_TABLET | Freq: Every day | ORAL | Status: DC
  Administered 2021-05-21 – 2021-05-24 (×4): 1 via ORAL
  Filled 2021-05-21 (×4): qty 1

## 2021-05-21 MED ORDER — APIXABAN 2.5 MG PO TABS: 2.5000 mg | ORAL_TABLET | Freq: Two times a day (BID) | ORAL | Status: DC

## 2021-05-21 MED ORDER — ZINC SULFATE 220 (50 ZN) MG PO CAPS
220.0000 mg | ORAL_CAPSULE | Freq: Every day | ORAL | Status: DC
  Administered 2021-05-21 – 2021-05-24 (×4): 220 mg via ORAL
  Filled 2021-05-21 (×4): qty 1

## 2021-05-21 MED ORDER — ASCORBIC ACID 500 MG PO TABS
1000.0000 mg | ORAL_TABLET | Freq: Every day | ORAL | Status: DC
  Administered 2021-05-21 – 2021-05-24 (×4): 1000 mg via ORAL
  Filled 2021-05-21 (×4): qty 2

## 2021-05-21 MED ORDER — SODIUM CHLORIDE 0.9 % IV SOLN
200.0000 mg | Freq: Every day | INTRAVENOUS | Status: DC
  Administered 2021-05-21 – 2021-05-22 (×2): 200 mg via INTRAVENOUS
  Filled 2021-05-21 (×3): qty 200

## 2021-05-21 MED ORDER — SODIUM CHLORIDE 0.9% IV SOLUTION: Freq: Once | INTRAVENOUS | Status: AC

## 2021-05-21 MED ORDER — CYCLOBENZAPRINE HCL 10 MG PO TABS
10.0000 mg | ORAL_TABLET | Freq: Every day | ORAL | Status: DC
  Administered 2021-05-21 – 2021-05-24 (×5): 10 mg via ORAL
  Filled 2021-05-21 (×5): qty 1

## 2021-05-21 MED ORDER — VITAMIN B-12 100 MCG PO TABS
100.0000 ug | ORAL_TABLET | Freq: Every day | ORAL | Status: DC
  Administered 2021-05-21 – 2021-05-24 (×4): 100 ug via ORAL
  Filled 2021-05-21 (×4): qty 1

## 2021-05-21 MED ORDER — RILPIVIRINE HCL 25 MG PO TABS
25.0000 mg | ORAL_TABLET | Freq: Every day | ORAL | Status: DC
  Administered 2021-05-21 – 2021-05-24 (×4): 25 mg via ORAL
  Filled 2021-05-21 (×5): qty 1

## 2021-05-21 MED ORDER — DOLUTEGRAVIR SODIUM 50 MG PO TABS
50.0000 mg | ORAL_TABLET | Freq: Every day | ORAL | Status: DC
  Administered 2021-05-21 – 2021-05-24 (×4): 50 mg via ORAL
  Filled 2021-05-21 (×4): qty 1

## 2021-05-21 MED ORDER — FAMOTIDINE 20 MG PO TABS
20.0000 mg | ORAL_TABLET | Freq: Every day | ORAL | Status: DC
  Administered 2021-05-21 – 2021-05-24 (×4): 20 mg via ORAL
  Filled 2021-05-21 (×4): qty 1

## 2021-05-21 NOTE — Progress Notes (Signed)
?PROGRESS NOTE ? ? ? ?[name] [name]  MVE:[id number] DOB: [birth year] DOA: 05/20/2021 ?PCP: [name], MD  ? ? ? ?Brief Narrative:  ? 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 ?Indwelling foley changed in the ED ?Hgb dropped to 6.8, eliquis held, received transfusion  ?ID/palliative care following  ? ?Subjective: ? ?He received prbcx1 early this am ?He is aaox3 but very poor memory, not able to remember recent hospitalization in march ?Not a reliable historian ?Reports pain in sacral wound ?No fever, nontoxic appearing, vital signs are stable ? ?Assessment & Plan: ? Principal Problem: ?  Osteomyelitis ([place]) ?Active Problems: ?  Human immunodeficiency virus (HIV) disease ([place]) ?  Protein-calorie malnutrition, severe ([place]) ?  Sacral decubitus ulcer, stage IV ([place]) ?  Rectal bleeding ?  Sacral osteomyelitis ([place]) ?  Endocarditis of mitral valve ? ? 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 ?Started on IV vancomycin cefepime and IV Flagyl in the ED, continue ?Defer change of IV antibiotics to infectious disease. ?Resume home anidulafungin ?Wound care specialist consulted ? infectious disease consulted  ? ?  ?Anemia of acute on chronic blood loss likely from sacral decubitus ulcer and rectal bleeding ?Hemoglobin 6.8, s/p prbc x1 on 5/13, Transfuse with hemoglobin less than 8 in the setting of  endocarditis. ?Hold off home Eliquis for now ?Monitor H&H ?  ?HIV/AIDS ?Resume home HAART-Prophylactic medications after home medications have been reconciled ?Management per infectious disease ?  ?GERD ?Resume home PPI ?  ?  ?History of DVT ?Hold off home oral anticoagulation, Eliquis, due to suspected lower GI bleed ?  ?Chronic urinary retention ?Indwelling Foley catheter was exchanged on 05/20/21 in the ED. ? ? Severe protein calorie malnutrition ?Failure to thrive in an adult ?BMI 15, albumin 3.2. ?Encourage increase in oral protein calorie intake ? ?FTT/bedbound/total care at baseline, cognitive impairment ?Goals of care ?Palliative care team consulted to assist with establishing goals of care. ?DNR ?Poor prognosis with failure to thrive in an adult and poor functional status. ?  ?  ?  ?  ? ?Skin Assessment: ?I have examined the patient?s skin and I agree with the wound assessment as performed by the wound care RN as outlined below: ?Pressure Injury 05/13/20 Sacrum Mid Stage 3 -  Full thickness tissue loss. Subcutaneous fat may be visible but bone, tendon or muscle are NOT exposed. Was [place] PI, now healing Stage 3 because was previously Stage 3 (Active)  ?05/13/20   ?Location: Sacrum  ?Location Orientation: Mid  ?Staging: Stage 3 -  Full thickness tissue loss. Subcutaneous fat may be visible but bone, tendon or muscle are NOT exposed.  ?Wound Description (Comments): Was [place] PI, now healing Stage 3 because was previously Stage 3  ?Present on Admission: Yes  ?   ?Pressure Injury 10/09/20 Heel Right Stage 2 -  Partial thickness loss of dermis presenting as a shallow open injury with a red, pink wound bed without slough. (Active)  ?  10/09/20 2300  ?Location: Heel  ?Location Orientation: Right  ?Staging: Stage 2 -  Partial thickness loss of dermis presenting as a shallow open injury with a red, pink wound bed without slough.  ?Wound Description (Comments):   ?Present on Admission: Yes  ?   ?Pressure Injury 12/17/20  Sacrum Mid Stage 4 - Full thickness tissue loss with exposed bone, tendon or muscle. (Active)  ?12/17/20   ?Location: Sacrum  ?Location Orientation: Mid  ?Staging: Stage 4 - Full thickness tissue loss with exposed bone, tendon or muscle.  ?Wound Description (Comments):   ?Present on Admission: Yes  ? ? ? ?I have Reviewed nursing notes, Vitals, pain scores, I/o's, Lab results and  imaging results since pt's last encounter, details please see discussion above  ?I ordered the following labs:  ?Unresulted Labs (From admission, onward)  ? ?  Start     Ordered  ? 05/22/21 0500  Creatinine, serum  Tomorrow morning,   R       ? 05/21/21 1002  ? 05/20/21 2213  HIV-1 RNA quant-no reflex-bld  Add-on,   AD       ? 05/20/21 2212  ? 05/20/21 2212  T-helper cells (CD4) count (not at [facility])  Add-on,   AD       ? 05/20/21 2212  ? 05/20/21 1906  Urine Culture  Once,   URGENT       ?Question:  Indication  Answer:  Dysuria  ? 05/20/21 1905  ? ?  ?  ? ?  ? ? ? ?DVT prophylaxis: scd's ? ? ?Code Status:   Code Status: DNR ? ?Family Communication: none at bedside  ?Disposition:  ? ?Status is: Inpatient ?  ? ?Dispo: The patient is from: SNF ?             Anticipated d/c is to: SNF with palliative care ?             Anticipated d/c date is: TBD ? ?Antimicrobials:   ?Anti-infectives (From admission, onward)  ? ? Start     Dose/Rate Route Frequency Ordered Stop  ? 05/21/21 1800  vancomycin (VANCOREADY) IVPB 750 mg/150 mL       ? 750 mg ?150 mL/hr over 60 Minutes Intravenous Every 24 hours 05/20/21 2212    ? 05/21/21 1000  anidulafungin (ERAXIS) 200 mg in sodium chloride 0.9 % 200 mL IVPB       ? 200 mg ?78 mL/hr over 200 Minutes Intravenous Daily 05/21/21 0009 06/01/21 0959  ? 05/21/21 1000  dolutegravir (TIVICAY) tablet 50 mg       ? 50 mg Oral Daily 05/21/21 0010    ? 05/21/21 1000  darunavir-cobicistat (PREZCOBIX) 800-150 MG per tablet 1 tablet       ? 1 tablet Oral Daily 05/21/21 0010    ? 05/21/21 0800  rilpivirine (EDURANT) tablet 25 mg        ? 25 mg Oral Daily with breakfast 05/21/21 0014    ? 05/21/21 0400  ceFEPIme (MAXIPIME) 2 g in sodium chloride 0.9 % 100 mL IVPB       ? 2 g ?200 mL/hr over 30 Minutes Intravenous Every 12 hours 05/20/21 2212    ? 05/21/21 0400  metroNIDAZOLE (FLAGYL) IVPB 500 mg       ? 500 mg ?100 mL/hr over 60 Minutes Intravenous Every 12 hours 05/20/21 2212    ? 05/20/21 2200  piperacillin-tazobactam (ZOSYN) IVPB 3.375 g  Status:  Discontinued       ?  3.375 g ?100 mL/hr over 30 Minutes Intravenous Every 8 hours 05/20/21 2143 05/20/21 2207  ? 05/20/21 2145  vancomycin (VANCOCIN) IVPB 1000 mg/200 mL premix  Status:  Discontinued       ? 1,000 mg ?200 mL/hr over 60 Minutes Intravenous Every 12 hours 05/20/21 2143 05/20/21 2209  ? 05/20/21 1915  vancomycin (VANCOCIN) IVPB 1000 mg/200 mL premix       ? 1,000 mg ?200 mL/hr over 60 Minutes Intravenous  Once 05/20/21 1904 05/20/21 2318  ? 05/20/21 1915  piperacillin-tazobactam (ZOSYN) IVPB 3.375 g       ? 3.375 g ?100 mL/hr over 30 Minutes Intravenous  Once 05/20/21 1904 05/20/21 2319  ? ?  ? ? ? ? ? ? ?Objective: ?Vitals:  ? 05/21/21 1300 05/21/21 1330 05/21/21 1356 05/21/21 1445  ?BP: 118/76 115/77 118/78 118/89  ?Pulse: 83  83 83  ?Resp: '12 15 16 19  '$ ?Temp:   97.9 ?F (36.6 ?C) 98.1 ?F (36.7 ?C)  ?TempSrc:      ?SpO2: 99%  98% 100%  ?Weight:      ?Height:      ? ? ?Intake/Output Summary (Last 24 hours) at 05/21/2021 1846 ?Last data filed at 05/21/2021 1733 ?Gross per 24 hour  ?Intake 1598.91 ml  ?Output 1200 ml  ?Net 398.91 ml  ? ?Filed Weights  ? 05/20/21 1919  ?Weight: 54.4 kg  ? ? ?Examination: ? ?General exam: AAOx3, poor memory,  ?Respiratory system: Clear to auscultation. Respiratory effort normal. ?Cardiovascular system:  RRR.  ?Gastrointestinal system: Abdomen is nondistended, soft and nontender.  Normal bowel sounds heard. ?Central nervous system: Alert and oriented x3. ?Extremities:  no edema, chronic hand deformity  ?Skin: sacral wound ?Psychiatry: calm, no agitation   ? ? ? ?Data Reviewed: I have personally reviewed  labs and visualized  imaging studies since the last encounter and formulate the plan  ? ? ? ? ? ? ?Scheduled Meds: ? vitamin C  1,000 mg Oral Daily  ? cyclobe

## 2021-05-21 NOTE — ED Notes (Signed)
Consent signed for blood transfusion

## 2021-05-21 NOTE — ED Notes (Signed)
Lorra Hals notified about patients hemoglobin of 6.8 again. ?

## 2021-05-21 NOTE — Consult Note (Signed)
?  [facility] for Infectious Disease  ? ? ?Date of Admission:  05/20/2021    ? ?Reason for Consult:osteomyelitis ?    ?Referring Physician: Dr Erlinda Hong ? ?Current antibiotics: ?Anidulafungin 5/13-present ?Cefepime 5/13-present ?Metronidazole 5/13-present ?Vancomycin 5/12-present ? ?Previous antibiotics: ?Zosyn x1 5/12 ? ? ? ?ASSESSMENT:   ? ?61 y.o. male admitted with: ? ?Bleeding from sacral decubitus ulcer: Has a history of sacral osteomyelitis status post a prolonged antibiotic course in 2022. 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  It appears that he completed a prolonged course of anidulafungin.  Per prior notes, it appears that his PICC line was removed on 05/12/2021 and he was started on voriconazole. ?HIV: Currently on ART and followed by [name].  Viral load and CD4 count were obtained this morning and are pending. ?Failure to 5, severe protein calorie malnutrition: Body mass index is 15.41 kg/m?Marland Kitchen  Albumin is 3.2. ? ?RECOMMENDATIONS:   ? ?Unlikely to benefit from another prolonged course of IV antibiotics in the setting of sacral decubitus wound and prior treated sacral osteomyelitis.  Does not appear to be septic at this time with white blood cell count normal and afebrile.  Would favor short course of therapy for possible superficially infected wound ?Continue vancomycin, cefepime, metronidazole for now ?Wound care ?Offloading ?Nutrition ?Continue ART ?It appears that he has completed his course of anidulafungin as best I can tell from the documentation and PICC line was removed 5/4.  He was then started on voriconazole from what I can discern.  For now, we will continue Eraxis until can be further clarified, however, likely can DC on voriconazole ? ? ?Principal Problem: ?  Osteomyelitis ([place]) ?Active Problems: ?  Human immunodeficiency virus (HIV) disease ([place]) ?  Sacral decubitus ulcer, stage IV ([place]) ?  Protein-calorie malnutrition, severe ([place]) ?   Rectal bleeding ?  Sacral osteomyelitis ([place]) ?  Endocarditis of mitral valve ? ? ?MEDICATIONS:   ? ?Scheduled Meds: ? vitamin C  1,000 mg Oral Daily  ? cyclobenzaprine  10 mg Oral QHS  ? darunavir-cobicistat  1 tablet Oral Daily  ? dolutegravir  50 mg Oral Daily  ? famotidine  20 mg Oral Daily  ? polycarbophil  625 mg Oral Daily  ? rilpivirine  25 mg Oral Q breakfast  ? cyanocobalamin  100 mcg Oral Daily  ? zinc sulfate  220 mg Oral Daily  ? ?Continuous Infusions: ? anidulafungin (ERAXIS) 200 mg IVPB 200 mg (05/21/21 1007)  ? ceFEPime (MAXIPIME) IV Stopped (05/21/21 0437)  ? lactated ringers 50 mL/hr at 05/20/21 2357  ? metronidazole Stopped (05/21/21 0437)  ? vancomycin    ? ?PRN Meds:.HYDROmorphone (DILAUDID) injection ? ?HPI:   ? ?[name] [name] is a 61 y.o. male with very complex past medical history followed in the ID clinic by [name]. ? ?He presented to the emergency department last night where he was evaluated by Dr. Nevada Crane.  He presented with bleeding from his sacral wound after this was noted at his skilled nursing facility.  Upon examination in the emergency department, he was noted to have severe excoriation around his sacral decubitus ulcer that was bleeding.  He was started on broad-spectrum antibiotics.  He had blood cultures obtained.  He was subsequently admitted to the hospital.  He reports that his sacral wound was doing overall okay until it started bleeding yesterday.  He has no fevers or chills.  He is not sure what antibiotics or antifungal  medication he is currently on.  Labs were notable for WBC 7.1, hemoglobin 6.8.  CMP unremarkable.  Viral load and CD4 count were obtained and are currently pending.  His blood cultures thus far are no growth to date.  We have been consulted for further recommendations. ? ? ?Past Medical History:  ?Diagnosis Date  ? Arthritis   ? Decubitus ulcer of sacral area   ? DVT (deep venous thrombosis) ([place]) 2008  ? Left leg  ? Exposure to hepatitis B   ? H/O  hypotension   ? History of anemia   ? History of gout   ? History of transfusion   ? History of urinary retention   ? due to radiation   ? HIV (human immunodeficiency virus infection) ([place])   ? Hx of sepsis 2015  ? affected rt hip / femur  ? Pain in limb 07/23/2013  ? PONV (postoperative nausea and vomiting)   ? Hiccups for 4 days after anesthesia  ? Radiation   ? Rectal cancer ([place])   ? Squamous cell  ? Shingles   ? left shoulder, right leg  ? ? ?Social History  ? ?Tobacco Use  ? Smoking status: Former  ?  Packs/day: 0.30  ?  Types: Cigarettes  ? Smokeless tobacco: Never  ?Vaping Use  ? Vaping Use: Never used  ?Substance Use Topics  ? Alcohol use: Not Currently  ?  Alcohol/week: 3.0 standard drinks  ?  Types: 3 Standard drinks or equivalent per week  ? Drug use: Not Currently  ?  Types: Marijuana  ?  Comment: occ.  ? ? ?Family History  ?Problem Relation Age of Onset  ? Diabetes Mother   ? Dementia Mother   ? Atrial fibrillation Mother   ? Hypertension Mother   ? Cancer Father   ? Emphysema Father   ? ? ?Allergies  ?Allergen Reactions  ? Collagen Rash  ?  redness  ? ? ?Review of Systems  ?Constitutional:  Negative for chills and fever.  ?Respiratory: Negative.    ?Cardiovascular: Negative.   ?Gastrointestinal: Negative.   ?Skin:   ?     Bleeding from his sacral wound  ?All other systems reviewed and are negative. ? ?OBJECTIVE:  ? ?Blood pressure 112/70, pulse 82, temperature 97.9 ?F (36.6 ?C), temperature source Oral, resp. rate 12, height '6\' 2"'$  (1.88 m), weight 54.4 kg, SpO2 100 %. ?Body mass index is 15.41 kg/m?. ? ?Physical Exam ?Constitutional:   ?   Comments: Alert, no acute distress, chronically ill-appearing man lying in the emergency department.  ?HENT:  ?   Head: Normocephalic and atraumatic.  ?   Mouth/Throat:  ?   Comments: Dentition is poor. ?Eyes:  ?   Extraocular Movements: Extraocular movements intact.  ?   Conjunctiva/sclera: Conjunctivae normal.  ?Cardiovascular:  ?   Rate and Rhythm: Normal rate and  regular rhythm.  ?Pulmonary:  ?   Effort: Pulmonary effort is normal. No respiratory distress.  ?   Breath sounds: Normal breath sounds.  ?Abdominal:  ?   General: There is no distension.  ?   Palpations: Abdomen is soft.  ?   Tenderness: There is no abdominal tenderness.  ?Genitourinary: ?   Comments: Foley catheter in place. ?Musculoskeletal:     ?   General: Normal range of motion.  ?   Cervical back: Normal range of motion and neck supple.  ?Skin: ?   General: Skin is warm and dry.  ?Neurological:  ?   General:  No focal deficit present.  ?   Mental Status: He is oriented to person, place, and time.  ?Psychiatric:     ?   Mood and Affect: Mood normal.     ?   Behavior: Behavior normal.  ? ? ? ?Lab Results: ?Lab Results  ?Component Value Date  ? WBC 7.1 05/21/2021  ? HGB 6.8 (LL) 05/21/2021  ? HCT 22.7 (L) 05/21/2021  ? MCV 97.8 05/21/2021  ? PLT 209 05/21/2021  ?  ?Lab Results  ?Component Value Date  ? NA 135 05/21/2021  ? K 3.9 05/21/2021  ? CO2 24 05/21/2021  ? GLUCOSE 89 05/21/2021  ? BUN 20 05/21/2021  ? CREATININE 1.45 (H) 05/21/2021  ? CALCIUM 10.0 05/21/2021  ? GFRNONAA 56 (L) 05/21/2021  ? GFRAA 81 06/23/2015  ?  ?Lab Results  ?Component Value Date  ? ALT 12 05/21/2021  ? AST 26 05/21/2021  ? ALKPHOS 69 05/21/2021  ? BILITOT 0.5 05/21/2021  ? ? ?   ?Component Value Date/Time  ? CRP 8.9 (H) 03/18/2021 0253  ? ? ?   ?Component Value Date/Time  ? ESRSEDRATE >140 (H) 03/18/2021 0253  ? ? ?I have reviewed the micro and lab results in Epic. ? ?Imaging: ?DG Pelvis Portable ? ?Result Date: 05/20/2021 ?CLINICAL DATA:  Rectal bleeding. EXAM: PORTABLE PELVIS 1-2 VIEWS COMPARISON:  March 18, 2021 FINDINGS: A total right hip replacement is seen without evidence of surrounding lucency to suggest the presence of hardware loosening or infection. There is diffuse osteopenia noted without evidence of an acute pelvic fracture or diastasis. Degenerative changes are seen involving the left hip. IMPRESSION: Right hip  replacement without evidence of hardware loosening or infection. Electronically Signed   By: Virgina Norfolk M.D.   On: 05/20/2021 20:21    ? ?Imaging independently reviewed in Epic. ? ?[place] [place] ?[facility]

## 2021-05-21 NOTE — ED Notes (Signed)
Lorra Hals notified about patients hemoglobin of 6.8. ?

## 2021-05-22 DIAGNOSIS — R531 Weakness: Secondary | ICD-10-CM | POA: Diagnosis not present

## 2021-05-22 DIAGNOSIS — Z515 Encounter for palliative care: Secondary | ICD-10-CM

## 2021-05-22 DIAGNOSIS — Z7189 Other specified counseling: Secondary | ICD-10-CM | POA: Diagnosis not present

## 2021-05-22 DIAGNOSIS — E43 Unspecified severe protein-calorie malnutrition: Secondary | ICD-10-CM | POA: Diagnosis not present

## 2021-05-22 LAB — CBC
HCT: 29.2 % — ABNORMAL LOW (ref 39.0–52.0)
Hemoglobin: 9.1 g/dL — ABNORMAL LOW (ref 13.0–17.0)
MCH: 29.5 pg (ref 26.0–34.0)
MCHC: 31.2 g/dL (ref 30.0–36.0)
MCV: 94.8 fL (ref 80.0–100.0)
Platelets: 260 10*3/uL (ref 150–400)
RBC: 3.08 MIL/uL — ABNORMAL LOW (ref 4.22–5.81)
RDW: 16.8 % — ABNORMAL HIGH (ref 11.5–15.5)
WBC: 7.1 10*3/uL (ref 4.0–10.5)
nRBC: 0 % (ref 0.0–0.2)

## 2021-05-22 LAB — BASIC METABOLIC PANEL
Anion gap: 9 (ref 5–15)
BUN: 21 mg/dL — ABNORMAL HIGH (ref 6–20)
CO2: 20 mmol/L — ABNORMAL LOW (ref 22–32)
Calcium: 10 mg/dL (ref 8.9–10.3)
Chloride: 110 mmol/L (ref 98–111)
Creatinine, Ser: 1.37 mg/dL — ABNORMAL HIGH (ref 0.61–1.24)
GFR, Estimated: 59 mL/min — ABNORMAL LOW (ref >60)
Glucose, Bld: 99 mg/dL (ref 70–99)
Potassium: 4 mmol/L (ref 3.5–5.1)
Sodium: 139 mmol/L (ref 135–145)

## 2021-05-22 LAB — HIV-1 RNA QUANT-NO REFLEX-BLD
HIV 1 RNA Quant: 1380 copies/mL
LOG10 HIV-1 RNA: 3.14 log10copy/mL

## 2021-05-22 MED ORDER — SENNOSIDES-DOCUSATE SODIUM 8.6-50 MG PO TABS
1.0000 | ORAL_TABLET | Freq: Two times a day (BID) | ORAL | Status: DC
  Administered 2021-05-23 – 2021-05-24 (×4): 1 via ORAL
  Filled 2021-05-22 (×5): qty 1

## 2021-05-22 MED ORDER — CHLORHEXIDINE GLUCONATE CLOTH 2 % EX PADS
6.0000 | MEDICATED_PAD | Freq: Every day | CUTANEOUS | Status: DC
  Administered 2021-05-23 – 2021-05-24 (×2): 6 via TOPICAL

## 2021-05-22 NOTE — Progress Notes (Signed)
?PROGRESS NOTE ? ? ? ?[name] [name]  TDH:[id number] DOB: 1960-05-21 DOA: 05/20/2021 ?PCP: [name], MD  ? ? ? ?Brief Narrative:  ? 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 ?Indwelling foley changed in the ED ?Hgb dropped to 6.8, eliquis held, received transfusion  ?ID/palliative care following  ? ?Subjective: ? ?States " I am trying to get to my sister's house to have thanksgiving dinner" ?When asked what year it is, he replies " 2023" ?When asked what month, he replier " may" ?He states he is at [facility] ? ?He denies pain ?Hgb improved to 9.1 today, no overt bleeding reported  ?No fever ? ? ?Assessment & Plan: ? Principal Problem: ?  Osteomyelitis ([place]) ?Active Problems: ?  Human immunodeficiency virus (HIV) disease ([place]) ?  Protein-calorie malnutrition, severe ([place]) ?  Sacral decubitus ulcer, stage IV ([place]) ?  Rectal bleeding ?  Sacral osteomyelitis ([place]) ?  Endocarditis of mitral valve ? ? 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 ?Started on IV vancomycin cefepime and IV Flagyl in the ED, continue ?Resume home anidulafungin ?Defer change of IV antibiotics to infectious disease. ?Wound care specialist consulted ? ? ? Chronic urinary retention ?Indwelling Foley catheter was exchanged on 05/20/21 in the ED. ?Urine culture obtained in the ED  ? ?Anemia of acute on chronic  blood loss likely from sacral decubitus ulcer and rectal bleeding ?Hemoglobin 6.8, s/p prbc x1 on 5/13,  ?Transfuse with hemoglobin less than 8 in the setting of endocarditis. ?Hold off home Eliquis for now ?Monitor H&H ? ? History of DVT ?Hold off home oral anticoagulation, Eliquis, due to suspected lower GI bleed ?  ?GERD ?Resume home PPI ? ?HIV/AIDS ?Resume home HAART-Prophylactic medications after home medications have been reconciled ?Management per infectious disease ?  ?Severe protein calorie malnutrition/Failure to thrive in an adult ?BMI 15, albumin 3.2. ?Encourage increase in oral protein calorie intake ? ?FTT/bedbound/total care at baseline, cognitive impairment ?Palliative care team consulted to assist with establishing goals of care. ?Poor prognosis  ?  ?  ?  ?  ? ?Skin Assessment: ?I have examined the patient?s skin and I agree with the wound assessment as performed by the wound care RN as outlined below: ?Pressure Injury 05/13/20 Sacrum Mid Stage 3 -  Full thickness tissue loss. Subcutaneous fat may be visible but bone, tendon or muscle are NOT exposed. Was [place] PI, now healing Stage 3 because was previously Stage 3 (Active)  ?05/13/20   ?Location: Sacrum  ?Location Orientation: Mid  ?Staging: Stage 3 -  Full thickness tissue loss. Subcutaneous fat may be visible but bone, tendon or muscle are NOT exposed.  ?Wound Description (Comments): Was [place] PI, now healing Stage 3 because was previously Stage 3  ?Present on Admission: Yes  ?   ?Pressure Injury 10/09/20 Heel Right Stage 2 -  Partial thickness loss of dermis presenting as a shallow open injury with a red, pink wound bed  without slough. (Active)  ?10/09/20 2300  ?Location: Heel  ?Location Orientation: Right  ?Staging: Stage 2 -  Partial thickness loss of dermis presenting as a shallow open injury with a red, pink wound bed without slough.  ?Wound Description (Comments):   ?Present on Admission: Yes  ?   ?Pressure Injury 12/17/20 Sacrum Mid Stage 4 -  Full thickness tissue loss with exposed bone, tendon or muscle. (Active)  ?12/17/20   ?Location: Sacrum  ?Location Orientation: Mid  ?Staging: Stage 4 - Full thickness tissue loss with exposed bone, tendon or muscle.  ?Wound Description (Comments):   ?Present on Admission: Yes  ? ? ? ?I have Reviewed nursing notes, Vitals, pain scores, I/o's, Lab results and  imaging results since pt's last encounter, details please see discussion above  ?I ordered the following labs:  ?Unresulted Labs (From admission, onward)  ? ?  Start     Ordered  ? 05/23/21 0500  Creatinine, serum  Tomorrow morning,   R       ? 05/22/21 0914  ? 05/20/21 2213  HIV-1 RNA quant-no reflex-bld  Add-on,   AD       ? 05/20/21 2212  ? 05/20/21 2212  T-helper cells (CD4) count (not at [facility])  Add-on,   AD       ? 05/20/21 2212  ? ?  ?  ? ?  ? ? ? ?DVT prophylaxis: scd's ? ? ?Code Status:   Code Status: DNR ? ?Family Communication: none at bedside  ?Disposition:  ? ?Status is: Inpatient ?  ? ?Dispo: The patient is from: SNF ?             Anticipated d/c is to: SNF with palliative care ?             Anticipated d/c date is: TBD, need ID clearance  ? ?Antimicrobials:   ?Anti-infectives (From admission, onward)  ? ? Start     Dose/Rate Route Frequency Ordered Stop  ? 05/21/21 1800  vancomycin (VANCOREADY) IVPB 750 mg/150 mL       ? 750 mg ?150 mL/hr over 60 Minutes Intravenous Every 24 hours 05/20/21 2212    ? 05/21/21 1000  anidulafungin (ERAXIS) 200 mg in sodium chloride 0.9 % 200 mL IVPB       ? 200 mg ?78 mL/hr over 200 Minutes Intravenous Daily 05/21/21 0009 06/01/21 0959  ? 05/21/21 1000  dolutegravir (TIVICAY) tablet 50 mg       ? 50 mg Oral Daily 05/21/21 0010    ? 05/21/21 1000  darunavir-cobicistat (PREZCOBIX) 800-150 MG per tablet 1 tablet       ? 1 tablet Oral Daily 05/21/21 0010    ? 05/21/21 0800  rilpivirine (EDURANT) tablet 25 mg       ? 25 mg Oral Daily with breakfast 05/21/21 0014    ? 05/21/21 0400  ceFEPIme (MAXIPIME) 2 g in sodium  chloride 0.9 % 100 mL IVPB       ? 2 g ?200 mL/hr over 30 Minutes Intravenous Every 12 hours 05/20/21 2212    ? 05/21/21 0400  metroNIDAZOLE (FLAGYL) IVPB 500 mg       ? 500 mg ?100 mL/hr over 60 Minutes Intravenous Every 12 hours 05/20/21 2212    ? 05/20/21 2200  piperacillin-tazobactam (ZOSYN) IVPB 3.375 g  Status:  Discontinued       ? 3.375 g ?100 mL/hr over 30 Minutes Intravenous Every 8 hours 05/20/21 2143 05/20/21 2207  ? 05/20/21 2145  vancomycin (VANCOCIN) IVPB 1000 mg/200 mL premix  Status:  Discontinued       ? 1,000 mg ?200 mL/hr over 60 Minutes Intravenous Every 12 hours 05/20/21 2143 05/20/21 2209  ? 05/20/21 1915  vancomycin (VANCOCIN) IVPB 1000 mg/200 mL premix       ? 1,000 mg ?200 mL/hr over 60 Minutes Intravenous  Once 05/20/21 1904 05/20/21 2318  ? 05/20/21 1915  piperacillin-tazobactam (ZOSYN) IVPB 3.375 g       ? 3.375 g ?100 mL/hr over 30 Minutes Intravenous  Once 05/20/21 1904 05/20/21 2319  ? ?  ? ? ? ? ? ? ?Objective: ?Vitals:  ? 05/21/21 2245 05/22/21 0014 05/22/21 0500 05/22/21 0618  ?BP: 107/73 (!) 87/66  101/71  ?Pulse: 87 88  83  ?Resp: '16 19  19  '$ ?Temp:  98.6 ?F (37 ?C)  98.7 ?F (37.1 ?C)  ?TempSrc:  Oral  Oral  ?SpO2: 100% 100%  100%  ?Weight:   57.4 kg   ?Height:      ? ? ?Intake/Output Summary (Last 24 hours) at 05/22/2021 1333 ?Last data filed at 05/22/2021 0530 ?Gross per 24 hour  ?Intake 903.73 ml  ?Output 1700 ml  ?Net -796.27 ml  ? ?Filed Weights  ? 05/20/21 1919 05/22/21 0500  ?Weight: 54.4 kg 57.4 kg  ? ? ?Examination: ? ?General exam: AAOx2, poor memory, cognitive impairment  ?Respiratory system: Clear to auscultation. Respiratory effort normal. ?Cardiovascular system:  RRR.  ?Gastrointestinal system: Abdomen is nondistended, soft and nontender.  Normal bowel sounds heard. ?Central nervous system: Alert and oriented x2. ?Extremities:  no edema, chronic hand deformity  ?Skin: sacral wound ?Psychiatry: calm, no agitation  ? ? ? ?Data Reviewed: I have personally reviewed  labs  and visualized  imaging studies since the last encounter and formulate the plan  ? ? ? ? ? ? ?Scheduled Meds: ? vitamin C  1,000 mg Oral Daily  ? cyclobenzaprine  10 mg Oral QHS  ? darunavir-cobicistat  1 tabl

## 2021-05-22 NOTE — Consult Note (Addendum)
WOC Nurse Consult Note: ?Patient known to our service from previous admissions. Chronic, nonhealing full thickness pressure injury to sacrum, Stage 3 is documented, this writer documented the wound as a healing stage 4 PI last admission on 03/18/21. Please see that note. The sacral wound is considered to be not healable. Foot drop is noted, full thickness wound on the lateral aspect of left foot also fecal incontinence. See also photo of sacral wound in EHR. ?Reason for Consult:Guidance for supportive and topical care is requested for Nursing. Palliative Care is involved in the care of this patient. Disposition is to SNF. ?Wound type:pressure, failure ?Pressure Injury POA: Yes ?Measurement:To be obtained by bedside RN today and documented on Nursing Flow Sheet. ?Wound BSJ:GGEZ, moist ?Drainage (amount, consistency, odor) serous in a moderate amount ?Periwound: macerated ?Dressing procedure/placement/frequency: A mattress replacement with low air loss feature is ordered. Turning and repositioning is in place. Heels are to be floated. Timely incontinence care is being provided.  ?Topical care to the left foot wound and the sacral pressure injury are to be with silver hydrofiber (Aquacel Ag+ Advantage, [place] # [phone]) topped with dry gauze and covered with a silicone foam. The silver is to be changed daily and the exterior dressing (silicone foam) changed every 3 days and PRN soiling.  ? ?Supportive therapies such as nutritional interventions, and antibiotics as outlined by Dr. Juleen China from ID are in place. Continued support by Palliative Care is appreciated. ? ?[facility] nursing team will not follow, but will remain available to this patient, the nursing and medical teams.  Please re-consult if needed. ?Thanks, ?Maudie Flakes, MSN, RN, [place], [place], CWON-AP, [place]  ?Pager# [phone]  ? ? ?  ?

## 2021-05-22 NOTE — Consult Note (Signed)
? ?                                                                                ?Consultation Note ?Date: 05/22/2021  ? ?Patient Name: [name] [name]  ?DOB: 1960/07/11  MRN: [id number]  Age / Sex: 61 y.o., male  ?PCP: [name], MD ?Referring Physician: Florencia Reasons, MD ? ?Reason for Consultation: Establishing goals of care ? ?HPI/Patient Profile: 61 y.o. 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 Indwelling foley was changed in the ED, admitted to hospital medicine service now,    ?ID following.  ? ?Clinical Assessment and Goals of Care: ?A palliative medicine consult has been requested for ongoing goals of care discussions.  ? ?The patient is awake alert resting in bed. He is currently not in pain, however, he admits to ongoing pain in his sacral area. I introduced myself and palliative care as follows: ?Palliative medicine is specialized medical care for people living with serious illness. It focuses on providing relief from the symptoms and stress of a serious illness. The goal is to improve quality of life for both the patient and the family. ?Goals of care: Broad aims of medical therapy in relation to the patient's values and preferences. Our aim is to provide medical care aimed at enabling patients to achieve the goals that matter most to them, given the circumstances of their particular medical situation and their constraints.  ? ?Discussed with patient about his current condition and scope of  current hospitalization. Goals, wishes and values attempted to be explored. See below.   ? ?NEXT OF KIN ? Sister.  ? ?SUMMARY OF RECOMMENDATIONS   ? DNR ?MOST form was filled out by palliative medicine team provider in a previous hospitalization, I reviewed it with the patient and he wishes to continue his previous MOST form choices.  ?Goals of care: patient wishes to continue with current mode of care, to use antibiotics, to continue wound care. SNF rehab with palliative care on discharge.  ?Thank you for the consult.  ? ?Code Status/Advance Care Planning: ?DNR ? ? ?Symptom Management:  ?  ? ?Palliative Prophylaxis:  ?Delirium Protocol ?  ?Psycho-social/Spiritual:  ?Desire for further Chaplaincy support:yes ?Additional Recommendations: Caregiving  Support/Resources ? ?Prognosis:  ?Unable to determine ? ?Discharge Planning: [facility] for rehab with Palliative care service follow-up  ? ?  ? ?Primary Diagnoses: ?Present on Admission: ? Osteomyelitis ([place]) ? Sacral decubitus ulcer, stage IV ([place]) ? Endocarditis of mitral valve ? Rectal bleeding ? Protein-calorie malnutrition, severe ([place]) ? Sacral osteomyelitis ([place]) ? Human immunodeficiency virus (HIV) disease ([place]) ? ? ?I have reviewed the medical record, interviewed the patient and family, and examined  the patient. The following aspects are pertinent. ? ?Past Medical History:  ?Diagnosis Date  ? Arthritis   ? Decubitus ulcer of sacral area   ? DVT (deep venous thrombosis) ([place]) 2008  ? Left leg  ? Exposure to hepatitis B   ? H/O hypotension   ? History of anemia   ? History of gout   ? History of transfusion   ? History of urinary retention   ? due to radiation   ? HIV (human immunodeficiency virus infection) ([place])   ? Hx of sepsis 2015  ? affected rt hip / femur  ? Pain in limb 07/23/2013  ? PONV (postoperative nausea and vomiting)   ? Hiccups for 4 days after anesthesia  ? Radiation   ? 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 Shingles   ? left  shoulder, right leg  ? ?Social History  ? ?Socioeconomic History  ? Marital status: Single  ?  Spouse name: Not on file  ? Number of children: 0  ? Years of education: BA  ? Highest education level: Not on file  ?Occupational History  ? Occupation: disabled  ?  Employer: [employer]  ?Tobacco Use  ? Smoking status: Former  ?  Packs/day: 0.30  ?  Types: Cigarettes  ? Smokeless tobacco: Never  ?Vaping Use  ? Vaping Use: Never used  ?Substance and Sexual Activity  ? Alcohol use: Not Currently  ?  Alcohol/week: 3.0 standard drinks  ?  Types: 3 Standard drinks or equivalent per week  ? Drug use: Not Currently  ?  Types: Marijuana  ?  Comment: occ.  ? Sexual activity: Never  ?  Partners: Male  ?  Comment: declined condoms  ?Other Topics Concern  ? Not on file  ?Social History Narrative  ? Not on file  ? ?Social Determinants of Health  ? ?Financial Resource Strain: Not on file  ?Food Insecurity: Not on file  ?Transportation Needs: Not on file  ?Physical Activity: Not on file  ?Stress: Not on file  ?Social Connections: Not on file  ? ?Family History  ?Problem Relation Age of Onset  ? Diabetes Mother   ? Dementia Mother   ? Atrial fibrillation Mother   ? Hypertension Mother   ? Cancer Father   ? Emphysema Father   ? ?Scheduled Meds: ? vitamin C  1,000 mg Oral Daily  ? cyclobenzaprine  10 mg Oral QHS  ? darunavir-cobicistat  1 tablet Oral Daily  ? dolutegravir  50 mg Oral Daily  ? famotidine  20 mg Oral Daily  ? polycarbophil  625 mg Oral Daily  ? rilpivirine  25 mg Oral Q breakfast  ? cyanocobalamin  100 mcg Oral Daily  ? zinc sulfate  220 mg Oral Daily  ? ?Continuous Infusions: ? anidulafungin (ERAXIS) 200 mg IVPB Stopped (05/21/21 1303)  ? ceFEPime (MAXIPIME) IV 2 g (05/22/21 0524)  ? lactated ringers 50 mL/hr at 05/22/21 0000  ? metronidazole 500 mg (05/22/21 0401)  ? vancomycin Stopped (05/21/21 1859)  ? ?PRN Meds:.HYDROmorphone (DILAUDID) injection ?Medications Prior to Admission:  ?Prior to Admission medications    ?Medication Sig Start Date End Date Taking? Authorizing Provider  ?acetaminophen (TYLENOL) 325 MG tablet Take 2 tablets (650 mg total) by mouth every 6 (six) hours as needed for mild pain (or Fever >/= 101). 12/23/20  Yes Raiford Noble [place], DO  ?Amino Acids-Protein Hydrolys (FEEDING SUPPLEMENT, PRO-STAT SUGAR FREE 64,) LIQD Take 30 mLs by mouth 3 (three)  times daily.   Yes [provider]  ?apixaban (ELIQUIS) 2.5 MG TABS tablet Take 1 tablet (2.5 mg total) by mouth 2 (two) times daily. 12/23/20  Yes Sheikh, Omair Latif, DO  ?cyanocobalamin 100 MCG tablet Take 100 mcg by mouth daily.   Yes [provider]  ?cyclobenzaprine (FLEXERIL) 10 MG tablet Take 10 mg by mouth at bedtime. 02/09/21  Yes [provider]  ?darunavir-cobicistat (PREZCOBIX) 800-150 MG tablet TAKE 1 TABLET BY MOUTH DAILY . SWALLOW WHOLE DO NOT CRUSH, BREAK OR CHEW TABLETS WITH FOOD. ?Patient taking differently: 1 tablet daily. 05/13/21  Yes [name], MD  ?dolutegravir (TIVICAY) 50 MG tablet Take 1 tablet (50 mg total) by mouth daily. 05/13/21  Yes [name], MD  ?famotidine (PEPCID) 20 MG tablet Take 1 tablet (20 mg total) by mouth daily. 12/23/20  Yes Raiford Noble [place], DO  ?Lidocaine 4 % PTCH Apply 1 patch topically daily. Apply to lumbar spine topically at bedtime and for pain; remove per schedule   Yes [provider]  ?NON FORMULARY Take 237 mLs by mouth 2 (two) times daily. House supplement for wound healing   Yes [provider]  ?oxycodone (OXY-IR) 5 MG capsule Take 1 capsule (5 mg total) by mouth See admin instructions. Give '5mg'$  by mouth daily for wound care. May also give '5mg'$  every 6 hours as needed for chronic pain ?Patient taking differently: Take 5 mg by mouth every 6 (six) hours as needed for pain. 03/23/21  Yes [place] Donnamarie Poag, DO  ?polycarbophil (FIBERCON) 625 MG tablet Take 625 mg by mouth daily.   Yes [provider]  ?rilpivirine (EDURANT) 25 MG TABS tablet TAKE  1 TABLET BY MOUTH DAILY WITH BREAKFAST ?Patient taking differently: Take 25 mg by mouth daily with breakfast. TAKE 1 TABLET BY MOUTH DAILY WITH BREAKFAST 05/13/21  Yes [name], MD  ?senna-docusate [facility]

## 2021-05-23 DIAGNOSIS — E43 Unspecified severe protein-calorie malnutrition: Secondary | ICD-10-CM | POA: Diagnosis not present

## 2021-05-23 DIAGNOSIS — B20 Human immunodeficiency virus [HIV] disease: Secondary | ICD-10-CM | POA: Diagnosis not present

## 2021-05-23 DIAGNOSIS — I059 Rheumatic mitral valve disease, unspecified: Secondary | ICD-10-CM | POA: Diagnosis not present

## 2021-05-23 DIAGNOSIS — M869 Osteomyelitis, unspecified: Secondary | ICD-10-CM | POA: Diagnosis not present

## 2021-05-23 LAB — TYPE AND SCREEN
ABO/RH(D): B POS
Antibody Screen: NEGATIVE
Unit division: 0

## 2021-05-23 LAB — CBC
HCT: 30.7 % — ABNORMAL LOW (ref 39.0–52.0)
Hemoglobin: 9.4 g/dL — ABNORMAL LOW (ref 13.0–17.0)
MCH: 29.9 pg (ref 26.0–34.0)
MCHC: 30.6 g/dL (ref 30.0–36.0)
MCV: 97.8 fL (ref 80.0–100.0)
Platelets: 269 10*3/uL (ref 150–400)
RBC: 3.14 MIL/uL — ABNORMAL LOW (ref 4.22–5.81)
RDW: 17 % — ABNORMAL HIGH (ref 11.5–15.5)
WBC: 8 10*3/uL (ref 4.0–10.5)
nRBC: 0 % (ref 0.0–0.2)

## 2021-05-23 LAB — URINE CULTURE: Culture: 40000 — AB

## 2021-05-23 LAB — BPAM RBC
Blood Product Expiration Date: 202305282359
ISSUE DATE / TIME: 202305130742
Unit Type and Rh: 7300

## 2021-05-23 LAB — T-HELPER CELLS (CD4) COUNT (NOT AT ARMC)
CD4 % Helper T Cell: 14 % — ABNORMAL LOW (ref 33–65)
CD4 T Cell Abs: 258 /uL — ABNORMAL LOW (ref 400–1790)

## 2021-05-23 LAB — CREATININE, SERUM
Creatinine, Ser: 1.25 mg/dL — ABNORMAL HIGH (ref 0.61–1.24)
GFR, Estimated: >60 mL/min (ref >60)

## 2021-05-23 MED ORDER — DOXYCYCLINE HYCLATE 100 MG PO TABS
100.0000 mg | ORAL_TABLET | Freq: Two times a day (BID) | ORAL | Status: DC
  Administered 2021-05-23 – 2021-05-24 (×4): 100 mg via ORAL
  Filled 2021-05-23 (×4): qty 1

## 2021-05-23 MED ORDER — AMOXICILLIN-POT CLAVULANATE 875-125 MG PO TABS
1.0000 | ORAL_TABLET | Freq: Two times a day (BID) | ORAL | Status: DC
  Administered 2021-05-23 – 2021-05-24 (×4): 1 via ORAL
  Filled 2021-05-23 (×4): qty 1

## 2021-05-23 MED ORDER — VORICONAZOLE 200 MG PO TABS
200.0000 mg | ORAL_TABLET | Freq: Two times a day (BID) | ORAL | Status: DC
  Administered 2021-05-23 – 2021-05-24 (×4): 200 mg via ORAL
  Filled 2021-05-23 (×4): qty 1

## 2021-05-23 NOTE — Progress Notes (Signed)
?PROGRESS NOTE ?  ?[name] [name]  DTO:[id number]    DOB: [birth year]    DOA: 05/20/2021 ? ?PCP: [name], MD  ? ?I have briefly reviewed patients previous medical records in [facility]. ? ?Chief Complaint  ?Patient presents with  ? Wound Check  ? ? 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  In ED, he was found to have severe excoriation around the sacral decubitus ulcer that was bleeding.  He was empirically started on IV vancomycin, cefepime and metronidazole for known osteomyelitis, bolused with IV normal saline and hospital admission was requested.  ID consulted and have transitioned from IV to oral antibiotics, recommend aggressive wound care and have cleared for DC from their standpoint.  Patient medically optimized for DC, awaiting return to SNF and insurance authorization for same.  TOC team on board. ? ? ?Assessment & Plan:  ?Principal Problem: ?  Osteomyelitis ([place]) ?Active Problems: ?  Human immunodeficiency virus (HIV) disease ([place]) ?  Protein-calorie malnutrition, severe ([place]) ?  Sacral decubitus ulcer, stage IV ([place]) ?  Rectal bleeding ?  Sacral osteomyelitis ([place]) ?  Endocarditis of mitral valve ? ? ?Sacral decubitus ulcer stage IV with chronic osteomyelitis of S4 and S5, POA, presented with bleeding from wounds: On admission had been started on IV vancomycin, cefepime and Flagyl.  ID input appreciated.  Discussed with Dr. Juleen China in person.  Does not feel that the wounds are overtly infected.  Wounds felt to be none healable.  Does  not feel that he will benefit from another prolonged course of antibiotic as it will not change his management.  Thereby they are treating for superficial infection with 5 more days of Augmentin and doxycycline to end on 5/19.  Wound care consultation 5/14 appreciated. ? ?Candida albicans bacteremia and MV endocarditis: Dr. Juleen China, ID discussed with his primary ID provider [name] who confirmed that patient currently is on voriconazole.  Prior to admission patient was on Rocephin and anidulafungin which were discontinued now. ? ?HIV disease: On ART (Prezcobix, Tivicay and rilpivirine) and followed by [name].  Viral load 1300 copies.  CD4 count pending.  Has follow-up with [name] on 06/09/2021 at 3:45 PM. ? ?Acute on chronic blood loss anemia from sacral decubitus ulcer: Hemoglobin 8.6.  Low index of suspicion that this was rectal bleeding.  Hemoglobin dropped to a low of 6.8.  Posttransfusion (1 unit PRBC on 5/13), hemoglobin up to 9.1-9.4. ? ?GERD: Continue PPI. ? ?History of DVT: Holding Eliquis at this time due to presentation with acute on chronic bleeding from sacral decubitus site, hemoglobin dropped to a low of 6.8.  Unclear when his DVT was and need to decide if risks of resuming Eliquis outweigh the benefits. ? ?Chronic urinary retention: Indwelling Foley catheter, recently changed in ED on 05/20/2021.  Asymptomatic bacteriuria and would not treat abnormal urine culture as per ID and agree with this. ? ?Adult failure to thrive: Multifactorial due to multiple severe significant comorbidities as noted above.  Palliative care consulted for goals of care and per their input, DNR, continue previously failed MOST form, patient wishes to  continue with current mode of care, continue antibiotics and wound care and SNF for rehab with palliative care on discharge. ? ?Severe protein calorie malnutrition: Appears cachectic and overall wasting of bitemporal areas and generalized muscle wasting.  BMI 16 and  albumin 3.2.  Encourage oral intake and protein supplements. ? ?Fecal impaction: Present on admission as per EDP note.  Aggressive bowel regimen. ? ?Acute kidney injury complicating stage II chronic kidney disease: PTA creatinine on 3/15 was 1.36.  Presented with creatinine of 1.51 which has gradually improved to 1.25.  May be related to some degree of dehydration.  Follow BMP in AM. ? ?Body mass index is 16.36 kg/m?. ? ?Pressure Ulcer: ?Pressure Injury 05/13/20 Sacrum Mid Stage 3 -  Full thickness tissue loss. Subcutaneous fat may be visible but bone, tendon or muscle are NOT exposed. Was [place] PI, now healing Stage 3 because was previously Stage 3 (Active)  ?05/13/20   ?Location: Sacrum  ?Location Orientation: Mid  ?Staging: Stage 3 -  Full thickness tissue loss. Subcutaneous fat may be visible but bone, tendon or muscle are NOT exposed.  ?Wound Description (Comments): Was [place] PI, now healing Stage 3 because was previously Stage 3  ?Present on Admission: Yes  ?Dressing Type Foam - Lift dressing to assess site every shift;Silver hydrofiber 05/23/21 0834  ? ? ? ?DVT prophylaxis:   SCDs ?  Code Status: DNR:  ?Family Communication: None at bedside ?Disposition:  ?Status is: Inpatient ? ?  ? ? ?Consultants:   ?ID ? ?Procedures:   ? ? ?Antimicrobials:   ?As noted above ? ?Subjective:  ?Patient reports that he stays at SNF, mostly bed bound, has not walked in several weeks when he used to walk with a walker.  Denies falls.  Buttock pain has significantly improved now reports some low back spasms. ? ?Objective:  ? ?Vitals:  ? 05/22/21 2130 05/23/21 0500 05/23/21 0541 05/23/21 1251  ?BP: 102/69  103/72 109/74  ?Pulse: 86  80 88  ?Resp: '18  18 16  '$ ?Temp: 98.8 ?F (37.1 ?C)  98.3 ?F (36.8 ?C) 98 ?F (36.7 ?C)  ?TempSrc: Oral  Oral Oral  ?SpO2: 100%  100% 100%  ?Weight:  57.8 kg    ?Height:      ? ? ?General exam: Young male, moderately built, frail, cachectic and chronically ill looking.  Bitemporal and generalized muscle  wasting. ?Respiratory system: Clear to auscultation. Respiratory effort normal. ?Cardiovascular system: S1 & S2 heard, RRR. No JVD, murmurs, rubs, gallops or clicks. No pedal edema. ?Gastrointestinal system: Abdomen is nondistended, soft and nontender. No organomegaly or masses felt. Normal bowel sounds heard. ?GU: Has indwelling Foley catheter. ?Central nervous system: Alert and oriented.  X3 no focal neurological deficits. ?Extremities: Symmetric 5 x 5 power.  Bilateral heel dressing with heel floaters. ?Skin: No rashes, lesions or ulcers ?Psychiatry: Judgement and insight appear somewhat impaired. Mood & affect appropriate.  ? ? ? ?Data Reviewed:   ?I have personally reviewed following labs and imaging studies ? ? ?CBC: ?Recent Labs  ?Lab 05/20/21 ?1903 05/21/21 ?0327 05/22/21 ?0330 05/23/21 ?0602  ?WBC 9.9 7.1 7.1 8.0  ?NEUTROABS 5.7 4.5  --   --   ?HGB 8.6* 6.8* 9.1* 9.4*  ?HCT 28.2* 22.7* 29.2* 30.7*  ?MCV 96.6 97.8 94.8 97.8  ?PLT 324 209 260 269  ? ? ?Basic Metabolic Panel: ?Recent Labs  ?Lab 05/20/21 ?1903 05/21/21 ?0327 05/22/21 ?0330 05/23/21 ?0331  ?NA 137 135 139  --   ?K 4.2 3.9 4.0  --   ?  CL 107 106 110  --   ?CO2 24 24 20*  --   ?GLUCOSE 100* 89 99  --   ?BUN 23* 20 21*  --   ?CREATININE 1.51* 1.45* 1.37* 1.25*  ?CALCIUM 10.6* 10.0 10.0  --   ?MG  --  1.9  --   --   ?PHOS  --  3.5  --   --   ? ? ?Liver Function Tests: ?Recent Labs  ?Lab 05/20/21 ?1903 05/21/21 ?0327  ?AST 27 26  ?ALT 15 12  ?ALKPHOS 80 69  ?BILITOT 0.5 0.5  ?PROT 7.8 6.7  ?ALBUMIN 3.2* 2.7*  ? ? ?CBG: ?No results for input(s): GLUCAP in the last 168 hours. ? ?Microbiology Studies:  ? ?Recent Results (from the past 240 hour(s))  ?Blood culture (routine x 2)     Status: None (Preliminary result)  ? Collection Time: 05/20/21  7:05 PM  ? Specimen: BLOOD  ?Result Value Ref Range Status  ? Specimen Description   Final  ?  BLOOD LEFT ANTECUBITAL ?Performed at [facility], [place] [address], [place], [place] [address] ?  ?  Special Requests   Final  ?  BOTTLES DRAWN AEROBIC AND ANAEROBIC Blood Culture adequate volume ?Performed at [facility], [place] [address] [place], [place] [address] ?  ? Culture   Final  ?  N

## 2021-05-23 NOTE — Progress Notes (Signed)
?   ? ?[facility] ? ?Date of Admission:  05/20/2021    ?       ?Reason for visit: Follow up on wound infection, endocarditis ? ? ?ASSESSMENT:   ? ?61 y.o. male admitted with: ? ?Sacral decubitus ulcer: he has a hx of sacral OM s/p prior prolonged abx course in 2022.  Presented with bleeding from his wound but no fevers, elevated WBC.  Wound is felt to be non-healable.  Stable currently on vanc, cefepime, flagyl. ?Candida endocarditis: discussed with his primary ID provider, Dr [name], who confirmed patient is currently on voriconazole. ?HIV disease: He is on ART (Prezcobix, Tivicay, Rilpivirine) and followed by Dr [name].  ?Abnormal urine culture: Collected from prior Foley in the ED.  Would not treat.  ? ?RECOMMENDATIONS:   ? ?Unlikely to benefit from another prolonged course of therapy as will not change his management.  Plan to treat for possible superficial infection with 5 more days Augmentin and Doxy ending 5/19 ?Wound care, keeping area clean, off loading ?Will change to voriconazole and stop Eraxis ?Continue ART.  Of note, his viral load is elevated up to 1300 copies.  CD4 count is pending.  Query whether this was a lab collection error as initial CD4 count order was discontinued due to not meeting lab requirements.  Will repeat viral load today ?Okay to DC from our perspective ?Has follow up with Dr [name] on 06/09/21 at 3:45pm ? ? ?Principal Problem: ?  Osteomyelitis ([place]) ?Active Problems: ?  Human immunodeficiency virus (HIV) disease ([place]) ?  Sacral decubitus ulcer, stage IV ([place]) ?  Protein-calorie malnutrition, severe ([place]) ?  Rectal bleeding ?  Sacral osteomyelitis ([place]) ?  Endocarditis of mitral valve ? ? ? ?MEDICATIONS:   ? ?Scheduled Meds: ? vitamin C  1,000 mg Oral Daily  ? Chlorhexidine Gluconate Cloth  6 each Topical Daily  ? cyclobenzaprine  10 mg Oral QHS  ? darunavir-cobicistat  1 tablet Oral Daily  ? dolutegravir  50 mg Oral Daily  ? famotidine  20 mg Oral Daily   ? polycarbophil  625 mg Oral Daily  ? rilpivirine  25 mg Oral Q breakfast  ? senna-docusate  1 tablet Oral BID  ? cyanocobalamin  100 mcg Oral Daily  ? zinc sulfate  220 mg Oral Daily  ? ?Continuous Infusions: ? anidulafungin (ERAXIS) 200 mg IVPB 200 mg (05/22/21 1032)  ? ceFEPime (MAXIPIME) IV 2 g (05/23/21 0334)  ? metronidazole 500 mg (05/23/21 0333)  ? vancomycin 750 mg (05/22/21 1746)  ? ?PRN Meds:.HYDROmorphone (DILAUDID) injection ? ?SUBJECTIVE:  ? ?24 hour events:  ?No acute events ? ? ?Reports feeling well.  No fevers, chills.  Eating okay.   ? ?Review of Systems  ?All other systems reviewed and are negative. ? ?  ?OBJECTIVE:  ? ?Blood pressure 103/72, pulse 80, temperature 98.3 ?F (36.8 ?C), temperature source Oral, resp. rate 18, height '6\' 2"'$  (1.88 m), weight 57.8 kg, SpO2 100 %. ?Body mass index is 16.36 kg/m?. ? ?Physical Exam ?Constitutional:   ?   General: He is not in acute distress. ?HENT:  ?   Head: Normocephalic and atraumatic.  ?   Mouth/Throat:  ?   Comments: Dentition is poor.  ?Eyes:  ?   Extraocular Movements: Extraocular movements intact.  ?   Conjunctiva/sclera: Conjunctivae normal.  ?Pulmonary:  ?   Effort: Pulmonary effort is normal. No respiratory distress.  ?Abdominal:  ?   General: There is no distension.  ?  Palpations: Abdomen is soft.  ?Musculoskeletal:     ?   General: Normal range of motion.  ?   Cervical back: Normal range of motion and neck supple.  ?Skin: ?   General: Skin is warm and dry.  ?   Findings: No rash.  ?Neurological:  ?   General: No focal deficit present.  ?   Mental Status: He is alert and oriented to person, place, and time. Mental status is at baseline.  ?Psychiatric:     ?   Mood and Affect: Mood normal.     ?   Behavior: Behavior normal.  ? ? ? ?Lab Results: ?Lab Results  ?Component Value Date  ? WBC 8.0 05/23/2021  ? HGB 9.4 (L) 05/23/2021  ? HCT 30.7 (L) 05/23/2021  ? MCV 97.8 05/23/2021  ? PLT 269 05/23/2021  ?  ?Lab Results  ?Component Value Date  ? NA  139 05/22/2021  ? K 4.0 05/22/2021  ? CO2 20 (L) 05/22/2021  ? GLUCOSE 99 05/22/2021  ? BUN 21 (H) 05/22/2021  ? CREATININE 1.25 (H) 05/23/2021  ? CALCIUM 10.0 05/22/2021  ? GFRNONAA >60 05/23/2021  ? GFRAA 81 06/23/2015  ?  ?Lab Results  ?Component Value Date  ? ALT 12 05/21/2021  ? AST 26 05/21/2021  ? ALKPHOS 69 05/21/2021  ? BILITOT 0.5 05/21/2021  ? ? ?   ?Component Value Date/Time  ? CRP 8.9 (H) 03/18/2021 0253  ? ? ?   ?Component Value Date/Time  ? ESRSEDRATE >140 (H) 03/18/2021 0253  ? ?  ?I have reviewed the micro and lab results in Epic. ? ?Imaging: ?No results found.  ? ?Imaging independently reviewed in Epic.  ? ? ?Mignon Pine ?[facility] ?[place] ?[phone] pager ?05/23/2021, 9:40 AM ? ? ?

## 2021-05-23 NOTE — Care Management Important Message (Signed)
Important Message ? ?Patient Details IM Letter given to the Patient ?Name: [name] [name] ?MRN: [id number] ?Date of Birth: May 20, 1960 ? ? ?Medicare Important Message Given:  Yes ? ? ? ? ?[name] ?05/23/2021, 1:19 PM ?

## 2021-05-24 DIAGNOSIS — I059 Rheumatic mitral valve disease, unspecified: Secondary | ICD-10-CM | POA: Diagnosis not present

## 2021-05-24 DIAGNOSIS — L89154 Pressure ulcer of sacral region, stage 4: Secondary | ICD-10-CM | POA: Diagnosis not present

## 2021-05-24 DIAGNOSIS — E43 Unspecified severe protein-calorie malnutrition: Secondary | ICD-10-CM | POA: Diagnosis not present

## 2021-05-24 DIAGNOSIS — B20 Human immunodeficiency virus [HIV] disease: Secondary | ICD-10-CM | POA: Diagnosis not present

## 2021-05-24 LAB — BASIC METABOLIC PANEL
Anion gap: 7 (ref 5–15)
BUN: 16 mg/dL (ref 6–20)
CO2: 20 mmol/L — ABNORMAL LOW (ref 22–32)
Calcium: 10.1 mg/dL (ref 8.9–10.3)
Chloride: 110 mmol/L (ref 98–111)
Creatinine, Ser: 1.01 mg/dL (ref 0.61–1.24)
GFR, Estimated: >60 mL/min (ref >60)
Glucose, Bld: 83 mg/dL (ref 70–99)
Potassium: 3.6 mmol/L (ref 3.5–5.1)
Sodium: 137 mmol/L (ref 135–145)

## 2021-05-24 LAB — CBC
HCT: 28.9 % — ABNORMAL LOW (ref 39.0–52.0)
Hemoglobin: 9.1 g/dL — ABNORMAL LOW (ref 13.0–17.0)
MCH: 29.9 pg (ref 26.0–34.0)
MCHC: 31.5 g/dL (ref 30.0–36.0)
MCV: 95.1 fL (ref 80.0–100.0)
Platelets: 263 10*3/uL (ref 150–400)
RBC: 3.04 MIL/uL — ABNORMAL LOW (ref 4.22–5.81)
RDW: 16.6 % — ABNORMAL HIGH (ref 11.5–15.5)
WBC: 7.5 10*3/uL (ref 4.0–10.5)
nRBC: 0 % (ref 0.0–0.2)

## 2021-05-24 MED ORDER — ONDANSETRON HCL 4 MG PO TABS: 4.0000 mg | ORAL_TABLET | Freq: Three times a day (TID) | ORAL | Status: AC | PRN

## 2021-05-24 MED ORDER — ADULT MULTIVITAMIN W/MINERALS CH
1.0000 | ORAL_TABLET | Freq: Every day | ORAL | Status: DC
  Administered 2021-05-24: 1 via ORAL
  Filled 2021-05-24: qty 1

## 2021-05-24 MED ORDER — SENNOSIDES-DOCUSATE SODIUM 8.6-50 MG PO TABS: 1.0000 | ORAL_TABLET | Freq: Two times a day (BID) | ORAL | Status: DC

## 2021-05-24 MED ORDER — ENSURE ENLIVE PO LIQD
237.0000 mL | Freq: Three times a day (TID) | ORAL | Status: DC
  Administered 2021-05-24 (×2): 237 mL via ORAL

## 2021-05-24 MED ORDER — ONDANSETRON HCL 4 MG PO TABS
4.0000 mg | ORAL_TABLET | Freq: Three times a day (TID) | ORAL | Status: DC | PRN
  Administered 2021-05-24: 4 mg via ORAL
  Filled 2021-05-24: qty 1

## 2021-05-24 MED ORDER — AMOXICILLIN-POT CLAVULANATE 875-125 MG PO TABS: 1.0000 | ORAL_TABLET | Freq: Two times a day (BID) | ORAL | Status: DC

## 2021-05-24 MED ORDER — ENSURE ENLIVE PO LIQD
237.0000 mL | Freq: Three times a day (TID) | ORAL | Status: DC
Start: 2021-05-24 — End: 2021-07-23

## 2021-05-24 MED ORDER — DOXYCYCLINE HYCLATE 100 MG PO TABS: 100.0000 mg | ORAL_TABLET | Freq: Two times a day (BID) | ORAL | Status: DC

## 2021-05-24 MED ORDER — ADULT MULTIVITAMIN W/MINERALS CH: 1.0000 | ORAL_TABLET | Freq: Every day | ORAL | Status: DC

## 2021-05-24 NOTE — TOC Progression Note (Signed)
Transition of Care (TOC) - Progression Note  ? ? ?Patient Details  ?Name: SCORPIO FORTIN ?MRN: [id number] ?Date of Birth: 10-27-1960 ? ?Transition of Care (TOC) CM/SW Contact  ?Purcell Mouton, RN ?Phone Number: ?05/24/2021, 1:52 PM ? ?Clinical Narrative:    ? ?[name] pt's sister was called to inform her that pt would transfer back to [facility] today.  ? ?  ?  ? ?Expected Discharge Plan and Services ?  ?  ?  ?  ?  ?Expected Discharge Date: 05/24/21               ?  ?  ?  ?  ?  ?  ?  ?  ?  ?  ? ? ?Social Determinants of Health (SDOH) Interventions ?  ? ?Readmission Risk Interventions ? ?  12/23/2020  ?  1:52 PM 05/28/2020  ? 10:58 AM  ?Readmission Risk Prevention Plan  ?Transportation Screening Complete Complete  ?PCP or Specialist Appt within 3-5 Days  Complete  ?[place] or Home Care Consult  Complete  ?Social Work Consult for [place] Planning/Counseling  Complete  ?Palliative Care Screening  Complete  ?Medication Review Press photographer) Complete Complete  ?PCP or Specialist appointment within 3-5 days of discharge Complete   ?[place] or Home Care Consult Complete   ?SW Recovery Care/Counseling Consult Complete   ?Palliative Care Screening Not Applicable   ?Skilled Nursing Facility Complete   ? ? ?

## 2021-05-24 NOTE — Progress Notes (Signed)
The patient is D/C in stable condition. A & O x 4. He was accompanied by 2 [facility] staff. No acute distress noted. He denies any pain. ?

## 2021-05-24 NOTE — Discharge Instructions (Signed)

## 2021-05-24 NOTE — Plan of Care (Signed)
PIV removed. Pt changed into personal clothing. Belongings gathered. Report called to [facility]. ? ? ?Problem: Education: ?Goal: Knowledge of General Education information will improve ?Description: Including pain rating scale, medication(s)/side effects and non-pharmacologic comfort measures ?Outcome: Completed/Met ?  ?Problem: Health Behavior/Discharge Planning: ?Goal: Ability to manage health-related needs will improve ?Outcome: Completed/Met ?  ?Problem: Clinical Measurements: ?Goal: Ability to maintain clinical measurements within normal limits will improve ?Outcome: Completed/Met ?Goal: Will remain free from infection ?Outcome: Completed/Met ?Goal: Diagnostic test results will improve ?Outcome: Completed/Met ?Goal: Respiratory complications will improve ?Outcome: Completed/Met ?Goal: Cardiovascular complication will be avoided ?Outcome: Completed/Met ?  ?Problem: Activity: ?Goal: Risk for activity intolerance will decrease ?Outcome: Completed/Met ?  ?Problem: Nutrition: ?Goal: Adequate nutrition will be maintained ?Outcome: Completed/Met ?  ?Problem: Coping: ?Goal: Level of anxiety will decrease ?Outcome: Completed/Met ?  ?Problem: Elimination: ?Goal: Will not experience complications related to bowel motility ?Outcome: Completed/Met ?Goal: Will not experience complications related to urinary retention ?Outcome: Completed/Met ?  ?Problem: Pain Managment: ?Goal: General experience of comfort will improve ?Outcome: Completed/Met ?  ?Problem: Safety: ?Goal: Ability to remain free from injury will improve ?Outcome: Completed/Met ?  ?Problem: Skin Integrity: ?Goal: Risk for impaired skin integrity will decrease ?Outcome: Completed/Met ?  ?

## 2021-05-24 NOTE — Progress Notes (Signed)
Initial Nutrition Assessment ? ?DOCUMENTATION CODES:  ? ?Severe malnutrition in context of chronic illness, Underweight ? ?INTERVENTION:  ?- Ensure Enlive po TID, each supplement provides 350 kcal and 20 grams of protein. ? ?- MVI with minerals daily, can be crushed with applesauce if needed ? ?NUTRITION DIAGNOSIS:  ? ?Severe Malnutrition related to chronic illness (HIV/AIDS, osteomyelitis of sacrum) as evidenced by severe fat depletion, severe muscle depletion. ? ?GOAL:  ? ?Patient will meet greater than or equal to 90% of their needs ? ?MONITOR:  ? ?PO intake, Supplement acceptance, Labs, Weight trends ? ?REASON FOR ASSESSMENT:  ? ?Consult, Malnutrition Screening Tool ?Assessment of nutrition requirement/status ? ?ASSESSMENT:  ? ?Pt admitted with sacral decubitus ulcer with history of osteomyelitis of the sacrum. 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 ? ?Pt states that he is provided 3 meals per day at his SNF but they do not provide a wide variety of foods, so he does not often each much. Pt states that within the last month he has had a significant decrease in his PO intake. He states that he just has not been feeling well. He also reports that he has lost 2 teeth within the last month which he also suspects could be attributing to his decrease in PO intake but denies difficulty chewing foods or mouth pain. He does report that during admission his intake has been improving. He typically drinks 3 Ensure per day and is agreeable to continue to drink these during and after admission. Briefly discussed the importance of adequate nutritional intake with emphasis on protein intake.  ? ?Meal completions: ?05/15: 50%-breakfast, 33%-dinner ?05/16: 0%-breakfast ? ?Pt  states that his usual weight is ~140 lbs and endorses a 20 lb weight loss within the last month. Reviewed weight history. Pt noted to have significant weight loss from 05/28/20-10/09/20. His weight remained stable until his prior hospital admission, pt noted to have a 6% weight loss from 03/10-04/10 which is significant for time frame, however has since regained.  ? ?Medications: vitamin c '1000mg'$  daily, darunavir-cobicistat, tivicay, doxycycline, pepcid, fibercon, senna, vitamin B12, zinc '220mg'$  daily ? ?Labs reviewed ? ?NUTRITION - FOCUSED PHYSICAL EXAM: ? ?Flowsheet Row Most Recent Value  ?Orbital Region Severe depletion  ?Upper Arm Region Severe depletion  ?Thoracic and Lumbar Region Severe depletion  ?Buccal Region Severe depletion  ?Temple Region Severe depletion  ?Clavicle Bone Region Moderate depletion  ?Clavicle and Acromion Bone Region Severe depletion  ?Scapular Bone Region Moderate depletion  ?Dorsal Hand Severe depletion  ?Patellar Region Severe depletion  ?Anterior Thigh Region Severe depletion  ?Posterior Calf Region Severe depletion  ?Edema (RD Assessment) None  ?Hair Reviewed  ?Eyes Reviewed  ?Mouth Reviewed  ?Skin Reviewed  ?Nails Reviewed  ? ?  ? ? ?Diet Order:   ?Diet Order   ? ?       ?  Diet regular Room service appropriate? Yes; Fluid consistency: Thin  Diet effective now       ?  ? ?  ?  ? ?  ? ? ?EDUCATION NEEDS:  ? ?Education needs have been addressed ? ?Skin:  Skin Assessment: Skin Integrity Issues: ?Skin Integrity Issues:: Stage III ?Stage III: mid sacrum ? ?Last BM:  5/16 ? ?Height:  ? ?Ht Readings from Last 1 Encounters:  ?05/20/21  $'6\' 2"'I$  (1.88 m)  ? ? ?Weight:  ? ?Wt Readings from Last 1 Encounters:  ?05/24/21 57.7 kg  ? ?BMI:  Body mass index is 16.33 kg/m?. ? ?Estimated Nutritional Needs:  ? ?Kcal:  2000-2200 ? ?Protein:  100-115g ? ?Fluid:  >/=2L ? ?Clayborne Dana, RDN, LDN ?Clinical Nutrition ?

## 2021-05-24 NOTE — Discharge Summary (Signed)
Physician Discharge Summary  ?[name] [name] MEQ:[id number] DOB: May 03, 1960 ? ?PCP: [name] Riches, [name] [name] ? ?Admitted from: SNF ?Discharged to: Return to prior SNF. ? ?Admit date: 05/20/2021 ?Discharge date: 05/24/2021 ? ?Recommendations for Outpatient Follow-up:  ? ? Follow-up Information   ? ? [name] Riches, [name] [name] Follow up on 06/09/2021.   ?Specialty: Infectious Diseases ?Why: appointment at 345pm ?Contact information: ?[address] ?[address] ?[place] [address] ?[phone] ? ? ?  ?  ? ? [name] [name] at SNF. Schedule an appointment as soon as possible for a visit.   ?Why: To be seen in 3 to 5 days with repeat labs (CBC & BMP).  Recommend palliative care consultation and follow-up at SNF. ? ?  ?  ? ?  ?  ? ?  ? ? ? ?Home Health: None ?  ? ?Equipment/Devices: TBD at SNF.  Do recommend an air overlay mattress to help heal the stage III sacral decubitus ulcer. ?  ? ?Discharge Condition: Improved and stable.  Overall long-term prognosis is poor. ?  Code Status: DNR ?Diet recommendation:  ?Discharge Diet Orders (From admission, onward)  ? ?  Start     Ordered  ? 05/24/21 0000  Diet general       ? 05/24/21 1341  ? ?  ?  ? ?  ?  ? ?Discharge Diagnoses:  ?Principal Problem: ?  Osteomyelitis ([place]) ?Active Problems: ?  Human immunodeficiency virus (HIV) disease ([place]) ?  Protein-calorie malnutrition, severe ([place]) ?  Sacral decubitus ulcer, stage IV ([place]) ?  Rectal bleeding ?  Sacral osteomyelitis ([place]) ?  Endocarditis of mitral valve ? ? 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  In ED, he was found to have severe excoriation around the sacral decubitus ulcer that was bleeding.  He was empirically started on IV vancomycin, cefepime and metronidazole for known osteomyelitis, bolused with IV normal saline and hospital admission was requested.  ID consulted and have transitioned from IV to brief course of oral antibiotics to end after 05/27/2021 doses, recommend aggressive wound care and have cleared for DC from their standpoint.   ?  ?  ?Assessment & Plan:  ? ?Sacral decubitus ulcer stage 4 with chronic osteomyelitis of S4 and S5, POA, presented with bleeding from wounds: On admission had been started on IV vancomycin, cefepime and Flagyl.  ID input appreciated.  They do not feel that the wounds are overtly infected.  Wounds felt to be none healable.  Does not feel that he will benefit from another prolonged course of antibiotic as it will not change his management.  Thereby they are treating for superficial infection with 5 more days of Augmentin and doxycycline to end on 5/19.  Wound care consultation 5/14 appreciated. ? ?Candida albicans bacteremia and MV endocarditis: [name] [name], ID discussed with his primary ID provider [name] [name] who confirmed that patient currently is on voriconazole.  Prior to admission patient was on Rocephin and anidulafungin which were discontinued now.  Patient has follow-up with [name] [name], PCP/ID on 6/1. ? ?HIV disease: On ART (Prezcobix, Tivicay and rilpivirine) and followed by [name] [name].  Viral load 1300 copies.  Has follow-up with [name] [name] on 06/09/2021 at 3:45 PM.  CD4 count 258. ?  ?Acute on chronic blood loss anemia from sacral decubitus ulcer: Low index of suspicion that this was rectal bleeding.  Hemoglobin dropped to a low of 6.8.  Posttransfusion (1 unit PRBC on 5/13), hemoglobin stable in the 9.1-9.4 range.  Extensively discussed with patient, he is unable to confirm if he had any rectal bleeding and reports that he did not see it.  It  is highly likely that he had bleeding from the large sacral decubitus ulcer but rectal bleeding cannot be completely ruled out.  Despite extensive chart review, unable to find details regarding his DVT for which she is on anticoagulation.  Unknown if he had a single DVT or recurrent DVT, when, duration of anticoagulation etc.  I personally discussed with [name] [name] who also is unaware of these.  Since patient is mostly nonambulatory and remains high risk for DVT, after carefully reviewing with [name] [name], plan to resume prior dose of Eliquis at DC.  This can be carefully considered as outpatient and if no clear indication to continue then can stop and also will have to be indefinitely discontinued if he has recurrent bleeding. ?  ?GERD: Continue PPI. ?  ?History of DVT: Please see detailed discussion above under anemia. ?  ?Chronic urinary retention: Indwelling Foley catheter, recently changed in ED on 05/20/2021.  Asymptomatic bacteriuria and would not treat abnormal urine culture as per ID and agree with this. ? ?Adult failure to thrive: Multifactorial due to multiple severe significant comorbidities as noted above.  Palliative care consulted for goals of care and per their input, DNR, continue previously failed MOST form, patient wishes to continue with current mode of care, continue antibiotics and wound care and SNF for rehab with palliative care on discharge. ?  ?Severe malnutrition in context of chronic illness, underweight: Appears cachectic and overall wasting of bitemporal areas and generalized muscle wasting.  BMI 16 and albumin 3.2.  Encourage oral intake and protein supplements.  Registered dietitian input appreciated. ?  ?Fecal impaction: Present on admission as per EDP note.  Aggressive bowel regimen to continue.. ?  ?Acute kidney injury complicating stage II chronic kidney disease: PTA creatinine on 3/15 was 1.36.  Presented with creatinine of 1.51 which has gradually improved to 1.25.  May be  related to some degree of dehydration.  Resolved.  Creatinine down to 1.01. ?  ?Body mass index is 16.36 kg/m?. ?  ?Pressure Ulcer: ?    ?Pressure Injury 05/13/20 Sacrum Mid Stage 3 -  Full thickness tissue loss. Subcutaneous fat may be visible but bone, tendon or muscle are NOT exposed. Was [place] PI, now healing Stage 3 because was previously Stage 3 (Active)  ?05/13/20   ?Location: Sacrum  ?Location Orientation: Mid  ?Staging: Stage 3 -  Full thickness tissue loss. Subcutaneous fat may be visible but bone, tendon or muscle are NOT exposed.  ?Wound Description (Comments): Was [place] PI, now healing Stage 3 because was previously Stage 3  ?Present on Admission: Yes  ?Dressing Type Foam - Lift dressing to assess site every shift;Silver hydrofiber 05/23/21 0834  ?  ?   ?Consultants:   ?ID ?  ?Procedures:   ?  ? ? ?Discharge Instructions ? ?Discharge Instructions   ? ? Call [name] [name] for:   Complete by: As directed ?  ? Recurrent bleeding from sacral wound.  ? Call [name] [name] for:  difficulty breathing, headache or  visual disturbances   Complete by: As directed ?  ? Call [name] [name] for:  extreme fatigue   Complete by: As directed ?  ? Call [name] [name] for:  persistant dizziness or light-headedness   Complete by: As directed ?  ? Call [name] [name] for:  persistant nausea and vomiting   Complete by: As directed ?  ? Call [name] [name] for:  redness, tenderness, or signs of infection (pain, swelling, redness, odor or green/yellow discharge around incision site)   Complete by: As directed ?  ? Call [name] [name] for:  severe uncontrolled pain   Complete by: As directed ?  ? Call [name] [name] for:  temperature >100.4   Complete by: As directed ?  ? Diet general   Complete by: As directed ?  ? Discharge wound care:   Complete by: As directed ?  ? Wound care  Daily      ?Comments: Wound care to left lateral foot and chronic nonhealing Stage 3 pressure injury to sacrum: cleanse with NS, pat dry Cover with silver hydrofiber (Aquacel Ag+, Advantage, Kellie Simmering [phone]), top with dry gauze, secure with silicone  foam dressings. Change silver hydrofiber daily. May reuse silicone foams for up to 3 days. Change PRN soiling.  ? Increase activity slowly   Complete by: As directed ?  ? ?  ? ?  ?Medication List  ?  ? ?STOP takin

## 2021-05-25 LAB — CULTURE, BLOOD (ROUTINE X 2)
Culture: NO GROWTH
Culture: NO GROWTH
Special Requests: ADEQUATE

## 2021-05-25 LAB — HIV-1 RNA QUANT-NO REFLEX-BLD
HIV 1 RNA Quant: 790 copies/mL
LOG10 HIV-1 RNA: 2.898 log10copy/mL

## 2021-06-07 ENCOUNTER — Other Ambulatory Visit: Payer: Self-pay | Admitting: Infectious Diseases

## 2021-06-07 DIAGNOSIS — B20 Human immunodeficiency virus [HIV] disease: Secondary | ICD-10-CM

## 2021-06-07 NOTE — Telephone Encounter (Signed)
Appt 6/1

## 2021-06-09 ENCOUNTER — Ambulatory Visit (INDEPENDENT_AMBULATORY_CARE_PROVIDER_SITE_OTHER): Payer: Medicare Other | Admitting: Infectious Diseases

## 2021-06-09 ENCOUNTER — Other Ambulatory Visit: Payer: Self-pay | Admitting: Infectious Diseases

## 2021-06-09 ENCOUNTER — Other Ambulatory Visit: Payer: Self-pay

## 2021-06-09 DIAGNOSIS — B20 Human immunodeficiency virus [HIV] disease: Secondary | ICD-10-CM

## 2021-06-09 DIAGNOSIS — K625 Hemorrhage of anus and rectum: Secondary | ICD-10-CM

## 2021-06-09 DIAGNOSIS — K089 Disorder of teeth and supporting structures, unspecified: Secondary | ICD-10-CM | POA: Diagnosis not present

## 2021-06-09 DIAGNOSIS — N179 Acute kidney failure, unspecified: Secondary | ICD-10-CM | POA: Diagnosis not present

## 2021-06-09 DIAGNOSIS — L89310 Pressure ulcer of right buttock, unstageable: Secondary | ICD-10-CM

## 2021-06-09 DIAGNOSIS — E43 Unspecified severe protein-calorie malnutrition: Secondary | ICD-10-CM

## 2021-06-09 DIAGNOSIS — N1831 Chronic kidney disease, stage 3a: Secondary | ICD-10-CM

## 2021-06-09 DIAGNOSIS — I059 Rheumatic mitral valve disease, unspecified: Secondary | ICD-10-CM | POA: Diagnosis not present

## 2021-06-09 DIAGNOSIS — I82493 Acute embolism and thrombosis of other specified deep vein of lower extremity, bilateral: Secondary | ICD-10-CM

## 2021-06-09 MED ORDER — MEGESTROL ACETATE 625 MG/5ML PO SUSP: 625.0000 mg | Freq: Every day | ORAL | 6 refills | Status: DC

## 2021-06-09 NOTE — Assessment & Plan Note (Addendum)
Prev episode 2008 and December of 2022.  He was started on apixiban 12-2020.  Will f/u with heme to see if we can stop his eliquis.   Spoke with h/o- can stop eliquis.  Will call sister. And nursing facility.

## 2021-06-09 NOTE — Progress Notes (Signed)
   Subjective:    Patient ID: [name] [name], male  DOB: [birth year], 61 y.o.        MRN: [id number]   HPI In brief- please see prev notes- 61 yo M with prev CVA, HIV+, sacral wound with osteomyelitis of L spine, suprapubic catheter, adm 03-17-21 with candida albicans (R-flucon) bacteremia and MV-IE.  He was initially treated with diflucan and then changed to vori.  He went back to hospital 5-12 to 5-16 with rectal excoriation and sacral wound bleeding. His hgb dropped to 6.8 and he was transfused. He was treated as cellulitis (home with augmentin/doxy).   He was prev on eliquis for a DVT. This was resumed in his hospital stay, and he was d/c on apixaban.   "I;m getting over a lot this week". Had "stomach flu" at [facility]. Was in hospital for "surgery for a couple of days".  He was written rx for n/v and states this is doing better.  Has air mattress at SNF.   HIV 1 RNA Quant (copies/mL)  Date Value  05/23/2021 790  05/21/2021 1,380  10/10/2020 120   CD4 T Cell Abs (/uL)  Date Value  05/23/2021 258 (L)  03/18/2021 487  10/10/2020 167 (L)     Health Maintenance  Topic Date Due   TETANUS/TDAP  Never done   Zoster Vaccines- Shingrix (1 of 2) Never done   COLONOSCOPY (Pts 45-23yr Insurance coverage will need to be confirmed)  Never done   INFLUENZA VACCINE  08/09/2021   COVID-19 Vaccine  Completed   Hepatitis C Screening  Completed   HIV Screening  Completed   HPV VACCINES  Aged Out      Review of Systems  Constitutional:  Negative for chills and fever.  HENT:  Negative for sore throat.   Gastrointestinal:  Positive for constipation. Negative for diarrhea.  No nose or gum bleeds.  Please see HPI. All other systems reviewed and negative.     Objective:  Physical Exam Constitutional:      General: He is not in acute distress.    Appearance: He is ill-appearing. He is not toxic-appearing.  HENT:     Mouth/Throat:     Mouth: Mucous membranes are dry.     Pharynx: No  oropharyngeal exudate.  Eyes:     Extraocular Movements: Extraocular movements intact.     Pupils: Pupils are equal, round, and reactive to light.  Cardiovascular:     Rate and Rhythm: Normal rate and regular rhythm.     Heart sounds: Murmur heard.  Pulmonary:     Effort: Pulmonary effort is normal.     Breath sounds: Normal breath sounds.  Abdominal:     General: Bowel sounds are normal. There is no distension.     Palpations: Abdomen is soft.     Tenderness: There is no abdominal tenderness.  Musculoskeletal:     Cervical back: Normal range of motion and neck supple. No rigidity.     Right lower leg: No edema.     Left lower leg: No edema.  Neurological:     Mental Status: He is alert.  Psychiatric:     Comments: MSE- date- Thursday, June, 2023 Place- [place] [facility], [facility].  Name- self           Assessment & Plan:

## 2021-06-09 NOTE — Assessment & Plan Note (Signed)
Appreciate f/u of WOC at his facility.

## 2021-06-09 NOTE — Assessment & Plan Note (Signed)
Will keep him on vfend.  No end date since he is not a surgery candidate.

## 2021-06-09 NOTE — Assessment & Plan Note (Signed)
Will repeat his Cr today. He does not appear fluid over loaded (in fact appears dry).

## 2021-06-09 NOTE — Assessment & Plan Note (Signed)
He is awaiting appt at his facility.

## 2021-06-09 NOTE — Assessment & Plan Note (Addendum)
His repeat labs in hospital were good, sl elevation of his HIV RNA. Overall he is the best I have seen him all year. He is alert and completely oriented.  He understands his illness and recent hospitalization.  Will resume his megace.  Will see him back in 6 weeks.

## 2021-06-09 NOTE — Assessment & Plan Note (Signed)
Will repeat his CBC

## 2021-06-09 NOTE — Assessment & Plan Note (Signed)
Appreciate f/u at his facility.

## 2021-06-09 NOTE — Addendum Note (Signed)
Addended by: Amatullah Christy C on: 06/09/2021 04:40 PM   Modules accepted: Orders

## 2021-06-10 LAB — T-HELPER CELL (CD4) - (RCID CLINIC ONLY)
CD4 % Helper T Cell: 19 % — ABNORMAL LOW (ref 33–65)
CD4 T Cell Abs: 680 /uL (ref 400–1790)

## 2021-06-13 LAB — COMPREHENSIVE METABOLIC PANEL
AG Ratio: 0.8 (calc) — ABNORMAL LOW (ref 1.0–2.5)
ALT: 9 U/L (ref 9–46)
AST: 17 U/L (ref 10–35)
Albumin: 3.7 g/dL (ref 3.6–5.1)
Alkaline phosphatase (APISO): 99 U/L (ref 35–144)
BUN/Creatinine Ratio: 15 (calc) (ref 6–22)
BUN: 21 mg/dL (ref 7–25)
CO2: 21 mmol/L (ref 20–32)
Calcium: 11.6 mg/dL — ABNORMAL HIGH (ref 8.6–10.3)
Chloride: 106 mmol/L (ref 98–110)
Creat: 1.37 mg/dL — ABNORMAL HIGH (ref 0.70–1.30)
Globulin: 4.4 g/dL (calc) — ABNORMAL HIGH (ref 1.9–3.7)
Glucose, Bld: 103 mg/dL — ABNORMAL HIGH (ref 65–99)
Potassium: 4 mmol/L (ref 3.5–5.3)
Sodium: 141 mmol/L (ref 135–146)
Total Bilirubin: 0.4 mg/dL (ref 0.2–1.2)
Total Protein: 8.1 g/dL (ref 6.1–8.1)

## 2021-06-13 LAB — CBC
HCT: 36.4 % — ABNORMAL LOW (ref 38.5–50.0)
Hemoglobin: 11.8 g/dL — ABNORMAL LOW (ref 13.2–17.1)
MCH: 29.8 pg (ref 27.0–33.0)
MCHC: 32.4 g/dL (ref 32.0–36.0)
MCV: 91.9 fL (ref 80.0–100.0)
MPV: 8.7 fL (ref 7.5–12.5)
Platelets: 430 10*3/uL — ABNORMAL HIGH (ref 140–400)
RBC: 3.96 10*6/uL — ABNORMAL LOW (ref 4.20–5.80)
RDW: 14.1 % (ref 11.0–15.0)
WBC: 10.2 10*3/uL (ref 3.8–10.8)

## 2021-06-13 LAB — HIV-1 RNA QUANT-NO REFLEX-BLD
HIV 1 RNA Quant: 803 Copies/mL — ABNORMAL HIGH
HIV-1 RNA Quant, Log: 2.9 Log cps/mL — ABNORMAL HIGH

## 2021-06-14 ENCOUNTER — Non-Acute Institutional Stay: Payer: Medicare Other | Admitting: Hospice

## 2021-06-14 ENCOUNTER — Non-Acute Institutional Stay: Payer: Medicare Other | Admitting: *Deleted

## 2021-06-14 DIAGNOSIS — Z515 Encounter for palliative care: Secondary | ICD-10-CM

## 2021-06-14 DIAGNOSIS — L89154 Pressure ulcer of sacral region, stage 4: Secondary | ICD-10-CM

## 2021-06-14 DIAGNOSIS — B20 Human immunodeficiency virus [HIV] disease: Secondary | ICD-10-CM

## 2021-06-14 DIAGNOSIS — R532 Functional quadriplegia: Secondary | ICD-10-CM

## 2021-06-14 DIAGNOSIS — E43 Unspecified severe protein-calorie malnutrition: Secondary | ICD-10-CM

## 2021-06-14 NOTE — Progress Notes (Signed)
[facility] Consult Note Telephone: [phone]  Fax: [phone]  PATIENT NAME: [name] [name] [name] [name] [id number] [phone] (home)  DOB: 1960-03-11 MRN: [id number]  PRIMARY CARE PROVIDER:    No primary care provider on file.,  No primary provider on file. None  REFERRING PROVIDER:   Dr Rich Brave  RESPONSIBLE PARTY:   Aura Camps Information     Name Relation Home Work Mobile   Tindall,Teresa Sister [phone]     Lonell Grandchild [phone]        TELEHEALTH VISIT STATEMENT Due to the COVID-19 crisis, this visit was done via telemedicine from my office and it was initiated and consent by this patient and or family.  I connected with patient OR PROXY by a telephone/video  and verified that I am speaking with the correct person. I discussed the limitations of evaluation and management by telemedicine. Patient/proxy expressed understanding and agreed to proceed. Monishia  RN is in person with patient facilitating video cal.  Palliative Care was asked to follow this patient to address advance care planning, complex medical decision making and goals of care clarification.   This is a follow up visit.    ASSESSMENT AND / RECOMMENDATIONS:   CODE STATUS: Patient is a Do Not Resuscitate.   Goals of Care: Goals include to maximize quality of life and symptom management. Discussion with [name] on patient's worsening decline in functional status, deescalate disease focused treatment due to poor prognosis; also discussed the benefits of hospice services. [name] said family is interested in hospice service; she said family is getting closer to deciding on hospice service.   Symptom Management/Plan  Stage 4 Sacral decubitus ulcer: Worsening with recent osteomyelitis of S4 and S5 for which patient was hospitalized last month;  treated with antibiotics with input from ID.  Epic hospitalization  reports indicate ID does not feel patient will benefit from prolonged course of antibiotics as wounds felt to be nonhealable.  Followed by Wound NP and facility wound nurse. Currently dressing with  Manuka honey sheets and dressing changes as ordered. Air mattress is in place, repositioning per facility protocol. Previous hospital report indicates GI recommended consideration of hospice.   Protein Caloric Malnutrition: Worsening Albumin: 2.7 05/21/21, 3.3 04/08/21.  Current weight 125.3 Ibs down from 127 Ibs 2 months ago, Height- 6 feet 2 inches BMI 16.06. Continue Megace, and house supplement and Juven Oral Packet (Nutritional Supplements) for protein/nutritional augmentation. Provide assistance during meals to ensure adequate oral intake/hydration. Nutritionist consult as needed. Routine CBC CMP  HIV/AIDS: Managed with Tivicay and Edurant Prezcobix,  Follow up: Palliative care will continue to follow for complex medical decision making, advance care planning, and clarification of goals. Return 6 weeks or prn.Encouraged to call provider sooner with any concerns.   Family /Caregiver/Community Supports: Patient in SNF for ongoing care.  HOSPICE ELIGIBILITY/DIAGNOSIS: TBD  Chief Complaint: follow-up visit visit  HISTORY OF PRESENT ILLNESS:  [name] [name] is a 61 y.o. year old male  with multiple morbidities requiring close monitoring/management with high risk of complications and morbidity:  severe protein caloric malnutrition, Functional quadriplegia, weakness, HIV/AIDS, stage IV sacral decubitus ulcer, osteomyelitis. History obtained from review of EMR, discussion with primary team, caregiver, family and/or Mr. Landin.  Review and summarization of Epic records shows history from other than patient. Rest of 10 point ROS asked and negative.  Independent interpretation of tests, reviewed as needed, available labs, patient records, imaging, studies and  related documents from the EMR.  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   GOUT 10/19/2005   ANEMIA, CHRONIC 03/20/2007   DVT (deep venous thrombosis) ([place]) 08/22/2006   ABSCESS, RECTUM 04/11/2006   Urethral stricture 09/05/2007   DIARRHEA 02/16/2006   HEPATITIS B, HX OF 10/19/2005   HERPES ZOSTER, HX OF 10/19/2005   Unspecified severe protein-calorie malnutrition ([place]) 02/14/2007   Enlargement of lymph nodes 01/13/2013   Inguinal adenopathy 02/18/2013   Pain in limb 07/23/2013   Septic arthritis of hip ([place]) 07/31/2013   Sacral decubitus ulcer, stage IV ([place]) 07/31/2013   Protein-calorie malnutrition, severe ([place]) 07/31/2013   Acute renal failure superimposed on stage 3a chronic kidney disease ([place]) 06/10/2014   Poor dentition 01/16/2017   Rectal bleeding 02/20/2020   Abnormal digital rectal exam 02/20/2020   History of anal cancer 02/20/2020   AKI (acute kidney injury) ([place]) 05/13/2020   Lactic acidosis 05/13/2020   SDH (subdural hematoma) ([place]) 05/13/2020   Hypothermia due to exposure 05/13/2020   Severe sepsis; Candidia albicans bacteremia ([place]) 05/13/2020   Cellulitis 05/13/2020   Elevated brain natriuretic peptide (BNP) level 05/13/2020   Volume overload 05/13/2020   Dehydration 05/13/2020   Decubitus ulcer 05/13/2020   Subdural empyema    Streptococcal bacteremia    Onychomycosis 08/19/2020   Penile ulcer 29/79/8921   Acute metabolic encephalopathy 19/41/7408   Sacral osteomyelitis ([place]) 10/09/2020   Chronic diastolic CHF (congestive heart failure) ([place]) 10/09/2020   Normochromic normocytic anemia 10/09/2020   Rigidity 10/09/2020   Hypernatremia 10/09/2020   Functional  quadriplegia ([place]) 10/09/2020   Pressure injury of skin 10/10/2020   Acute encephalopathy 10/10/2020   Encephalopathy    Palliative care encounter 12/09/2020   Ileus ([place]) 12/09/2020   GIB (gastrointestinal bleeding) 12/16/2020   Bleeding from wound 12/19/2020   ABLA (acute blood loss anemia) 12/19/2020   Acute urinary retention 12/19/2020   FTT (failure to thrive) in adult 12/19/2020   Thrombocytopenia ([place]) 12/21/2020   High anion gap metabolic acidosis 14/48/1856   Hypercalcemia 03/17/2021   Nonrheumatic mitral valve regurgitation    Acute candidal endocarditis 03/24/2021   Endocarditis of mitral valve 05/12/2021   Osteomyelitis ([place]) 05/20/2021   Resolved Ambulatory Problems    Diagnosis Date Noted   TOBACCO USER 02/16/2006   Dental caries 03/20/2007   Septic shock(785.52) 08/03/2013   S/P right TH revision 06/15/2014   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   Past Medical History:  Diagnosis Date   Arthritis    Decubitus ulcer of sacral area    Exposure to hepatitis B    H/O hypotension    History of anemia    History of gout    History of transfusion    History of urinary retention    HIV (human immunodeficiency virus infection) ([place])    Hx of sepsis 2015   PONV (postoperative nausea and vomiting)    Radiation    Rectal cancer ([place])    Shingles     SOCIAL HX:  Social History   Tobacco Use   Smoking status: Former    Packs/day: 0.30    Types: Cigarettes   Smokeless tobacco: Never  Substance Use Topics   Alcohol use:  Not Currently    Alcohol/week: 3.0 standard drinks    Types: 3 Standard drinks or equivalent per week     FAMILY HX:  Family History  Problem Relation Age of Onset   Diabetes Mother    Dementia Mother    Atrial fibrillation Mother    Hypertension Mother    Cancer Father    Emphysema Father       ALLERGIES:  Allergies  Allergen Reactions   Collagen Rash    redness      PERTINENT MEDICATIONS:  Outpatient Encounter  Medications as of 06/14/2021  Medication Sig   acetaminophen (TYLENOL) 325 MG tablet Take 2 tablets (650 mg total) by mouth every 6 (six) hours as needed for mild pain (or Fever >/= 101).   cyanocobalamin 100 MCG tablet Take 100 mcg by mouth daily.   cyclobenzaprine (FLEXERIL) 10 MG tablet Take 10 mg by mouth at bedtime.   darunavir-cobicistat (PREZCOBIX) 800-150 MG tablet TAKE 1 TABLET BY MOUTH DAILY . SWALLOW WHOLE DO NOT CRUSH, BREAK OR CHEW TABLETS WITH FOOD.   dolutegravir (TIVICAY) 50 MG tablet Take 1 tablet (50 mg total) by mouth daily.   famotidine (PEPCID) 20 MG tablet Take 1 tablet (20 mg total) by mouth daily.   feeding supplement (ENSURE ENLIVE / ENSURE PLUS) LIQD Take 237 mLs by mouth 3 (three) times daily between meals.   megestrol (MEGACE ES) 625 MG/5ML suspension Take 5 mLs (625 mg total) by mouth daily.   Multiple Vitamin (MULTIVITAMIN WITH MINERALS) TABS tablet Take 1 tablet by mouth daily.   oxycodone (OXY-IR) 5 MG capsule Take 1 capsule (5 mg total) by mouth See admin instructions. Give '5mg'$  by mouth daily for wound care. May also give '5mg'$  every 6 hours as needed for chronic pain (Patient taking differently: Take 5 mg by mouth every 6 (six) hours as needed for pain.)   polycarbophil (FIBERCON) 625 MG tablet Take 625 mg by mouth daily.   rilpivirine (EDURANT) 25 MG TABS tablet TAKE 1 TABLET BY MOUTH DAILY WITH BREAKFAST   senna-docusate (SENOKOT-S) 8.6-50 MG tablet Take 1 tablet by mouth 2 (two) times daily.   simethicone (MYLICON) 80 MG chewable tablet Chew 80 mg by mouth with breakfast, with lunch, and with evening meal.   VFEND 200 MG tablet Take 1 tablet (200 mg total) by mouth 2 (two) times daily.   vitamin C (ASCORBIC ACID) 500 MG tablet Take 1,000 mg by mouth daily.   zinc sulfate 220 (50 Zn) MG capsule Take 1 capsule (220 mg total) by mouth daily.   No facility-administered encounter medications on file as of 06/14/2021.   I spent 50 minutes providing this consultation;  this includes time spent with patient/family, chart review and documentation. More than 50% of the time in this consultation was spent on counseling and coordinating communication  Thank you for the opportunity to participate in the care of Mr. Bostwick.  The palliative care team will continue to follow. Please call our office at [phone] if we can be of additional assistance.   Note: Portions of this note were generated with Lobbyist. Dictation errors may occur despite best attempts at proofreading.  Teodoro Spray, NP

## 2021-06-14 NOTE — Progress Notes (Signed)
[facility] RN NOTE  PATIENT NAME: [name] [name] DOB: November 19, 1960 MRN: [id number]  PRIMARY CARE PROVIDER: No primary care provider on file.  RESPONSIBLE PARTY: [name] (sister) Acct ID - Guarantor Home Phone Work Phone Relationship Acct Type  [id number] [name], [phone]  Self P/F     [place], [place], [place] [address]    RN face to face visit completed with patient in his room at the facility [facility]). Conferenced in [facility] NP via video. See NP note for complete visit details.   (Duration of visit and documentation 30 minutes)   Daryl Eastern, RN BSN

## 2021-06-15 ENCOUNTER — Telehealth: Payer: Self-pay

## 2021-06-15 ENCOUNTER — Other Ambulatory Visit: Payer: Self-pay | Admitting: Infectious Diseases

## 2021-06-15 NOTE — Telephone Encounter (Signed)
Spoke to Therapist, sports at [employer]- verbal orders given for lab orders PTH, UPEP, SPEP. RN repeated orders back to me and verbalized her understanding. [facility] fax number given to RN. Lab results will be faxed once done.    [place], CMA

## 2021-06-15 NOTE — Telephone Encounter (Signed)
-----   Message from [name], MD sent at 06/15/2021  9:46 AM EDT ----- Pt needs a PTH, UPEP, SPEP I've ordered. Can he get at his SNF?

## 2021-06-15 NOTE — Progress Notes (Signed)
Pt needs a PTH, UPEP, SPEP I've ordered. Can he get at his SNF?

## 2021-06-20 ENCOUNTER — Encounter: Payer: Self-pay | Admitting: Infectious Diseases

## 2021-07-19 ENCOUNTER — Emergency Department (HOSPITAL_COMMUNITY): Payer: Medicare Other

## 2021-07-19 ENCOUNTER — Inpatient Hospital Stay (HOSPITAL_COMMUNITY)
Admission: EM | Admit: 2021-07-19 | Discharge: 2021-07-23 | DRG: 698 | Disposition: A | Discharge status: Hospice | Payer: Medicare Other | Source: Skilled Nursing Facility | Attending: Internal Medicine | Admitting: Internal Medicine

## 2021-07-19 ENCOUNTER — Other Ambulatory Visit: Payer: Self-pay

## 2021-07-19 ENCOUNTER — Encounter (HOSPITAL_COMMUNITY): Payer: Self-pay | Admitting: Internal Medicine

## 2021-07-19 DIAGNOSIS — I130 Hypertensive heart and chronic kidney disease with heart failure and stage 1 through stage 4 chronic kidney disease, or unspecified chronic kidney disease: Secondary | ICD-10-CM | POA: Diagnosis present

## 2021-07-19 DIAGNOSIS — Z96641 Presence of right artificial hip joint: Secondary | ICD-10-CM | POA: Diagnosis present

## 2021-07-19 DIAGNOSIS — N179 Acute kidney failure, unspecified: Secondary | ICD-10-CM

## 2021-07-19 DIAGNOSIS — M4628 Osteomyelitis of vertebra, sacral and sacrococcygeal region: Secondary | ICD-10-CM | POA: Diagnosis present

## 2021-07-19 DIAGNOSIS — L8940 Pressure ulcer of contiguous site of back, buttock and hip, unspecified stage: Secondary | ICD-10-CM

## 2021-07-19 DIAGNOSIS — J9601 Acute respiratory failure with hypoxia: Secondary | ICD-10-CM | POA: Diagnosis not present

## 2021-07-19 DIAGNOSIS — E162 Hypoglycemia, unspecified: Secondary | ICD-10-CM | POA: Diagnosis not present

## 2021-07-19 DIAGNOSIS — R531 Weakness: Secondary | ICD-10-CM | POA: Diagnosis not present

## 2021-07-19 DIAGNOSIS — Z681 Body mass index (BMI) 19 or less, adult: Secondary | ICD-10-CM

## 2021-07-19 DIAGNOSIS — I248 Other forms of acute ischemic heart disease: Secondary | ICD-10-CM | POA: Diagnosis present

## 2021-07-19 DIAGNOSIS — R6521 Severe sepsis with septic shock: Secondary | ICD-10-CM | POA: Diagnosis present

## 2021-07-19 DIAGNOSIS — A4159 Other Gram-negative sepsis: Secondary | ICD-10-CM | POA: Diagnosis present

## 2021-07-19 DIAGNOSIS — E872 Acidosis, unspecified: Secondary | ICD-10-CM | POA: Diagnosis present

## 2021-07-19 DIAGNOSIS — Z818 Family history of other mental and behavioral disorders: Secondary | ICD-10-CM

## 2021-07-19 DIAGNOSIS — N35919 Unspecified urethral stricture, male, unspecified site: Secondary | ICD-10-CM | POA: Diagnosis present

## 2021-07-19 DIAGNOSIS — B964 Proteus (mirabilis) (morganii) as the cause of diseases classified elsewhere: Secondary | ICD-10-CM | POA: Diagnosis present

## 2021-07-19 DIAGNOSIS — B370 Candidal stomatitis: Secondary | ICD-10-CM | POA: Diagnosis not present

## 2021-07-19 DIAGNOSIS — D631 Anemia in chronic kidney disease: Secondary | ICD-10-CM | POA: Diagnosis present

## 2021-07-19 DIAGNOSIS — M1990 Unspecified osteoarthritis, unspecified site: Secondary | ICD-10-CM | POA: Diagnosis present

## 2021-07-19 DIAGNOSIS — I340 Nonrheumatic mitral (valve) insufficiency: Secondary | ICD-10-CM | POA: Diagnosis present

## 2021-07-19 DIAGNOSIS — Z515 Encounter for palliative care: Secondary | ICD-10-CM | POA: Diagnosis not present

## 2021-07-19 DIAGNOSIS — Z22322 Carrier or suspected carrier of Methicillin resistant Staphylococcus aureus: Secondary | ICD-10-CM

## 2021-07-19 DIAGNOSIS — Z85048 Personal history of other malignant neoplasm of rectum, rectosigmoid junction, and anus: Secondary | ICD-10-CM

## 2021-07-19 DIAGNOSIS — Z20822 Contact with and (suspected) exposure to covid-19: Secondary | ICD-10-CM | POA: Diagnosis present

## 2021-07-19 DIAGNOSIS — N19 Unspecified kidney failure: Secondary | ICD-10-CM

## 2021-07-19 DIAGNOSIS — R5381 Other malaise: Secondary | ICD-10-CM | POA: Diagnosis present

## 2021-07-19 DIAGNOSIS — E43 Unspecified severe protein-calorie malnutrition: Secondary | ICD-10-CM | POA: Diagnosis present

## 2021-07-19 DIAGNOSIS — Z9359 Other cystostomy status: Secondary | ICD-10-CM

## 2021-07-19 DIAGNOSIS — E875 Hyperkalemia: Secondary | ICD-10-CM | POA: Diagnosis not present

## 2021-07-19 DIAGNOSIS — Z87891 Personal history of nicotine dependence: Secondary | ICD-10-CM

## 2021-07-19 DIAGNOSIS — I96 Gangrene, not elsewhere classified: Secondary | ICD-10-CM | POA: Diagnosis present

## 2021-07-19 DIAGNOSIS — B20 Human immunodeficiency virus [HIV] disease: Secondary | ICD-10-CM | POA: Diagnosis present

## 2021-07-19 DIAGNOSIS — Z79818 Long term (current) use of other agents affecting estrogen receptors and estrogen levels: Secondary | ICD-10-CM

## 2021-07-19 DIAGNOSIS — Y738 Miscellaneous gastroenterology and urology devices associated with adverse incidents, not elsewhere classified: Secondary | ICD-10-CM | POA: Diagnosis present

## 2021-07-19 DIAGNOSIS — N136 Pyonephrosis: Secondary | ICD-10-CM | POA: Diagnosis present

## 2021-07-19 DIAGNOSIS — M869 Osteomyelitis, unspecified: Secondary | ICD-10-CM | POA: Diagnosis not present

## 2021-07-19 DIAGNOSIS — K5900 Constipation, unspecified: Secondary | ICD-10-CM | POA: Diagnosis not present

## 2021-07-19 DIAGNOSIS — Z79899 Other long term (current) drug therapy: Secondary | ICD-10-CM

## 2021-07-19 DIAGNOSIS — L97428 Non-pressure chronic ulcer of left heel and midfoot with other specified severity: Secondary | ICD-10-CM | POA: Diagnosis present

## 2021-07-19 DIAGNOSIS — F0390 Unspecified dementia without behavioral disturbance: Secondary | ICD-10-CM | POA: Diagnosis present

## 2021-07-19 DIAGNOSIS — R6520 Severe sepsis without septic shock: Secondary | ICD-10-CM

## 2021-07-19 DIAGNOSIS — J690 Pneumonitis due to inhalation of food and vomit: Secondary | ICD-10-CM | POA: Diagnosis not present

## 2021-07-19 DIAGNOSIS — N1831 Chronic kidney disease, stage 3a: Secondary | ICD-10-CM | POA: Diagnosis present

## 2021-07-19 DIAGNOSIS — Z87440 Personal history of urinary (tract) infections: Secondary | ICD-10-CM

## 2021-07-19 DIAGNOSIS — Z7401 Bed confinement status: Secondary | ICD-10-CM

## 2021-07-19 DIAGNOSIS — R935 Abnormal findings on diagnostic imaging of other abdominal regions, including retroperitoneum: Secondary | ICD-10-CM | POA: Diagnosis present

## 2021-07-19 DIAGNOSIS — K828 Other specified diseases of gallbladder: Secondary | ICD-10-CM | POA: Diagnosis present

## 2021-07-19 DIAGNOSIS — D649 Anemia, unspecified: Secondary | ICD-10-CM | POA: Diagnosis not present

## 2021-07-19 DIAGNOSIS — T83091A Other mechanical complication of indwelling urethral catheter, initial encounter: Secondary | ICD-10-CM | POA: Diagnosis present

## 2021-07-19 DIAGNOSIS — R627 Adult failure to thrive: Secondary | ICD-10-CM | POA: Diagnosis present

## 2021-07-19 DIAGNOSIS — Z888 Allergy status to other drugs, medicaments and biological substances status: Secondary | ICD-10-CM

## 2021-07-19 DIAGNOSIS — R338 Other retention of urine: Secondary | ICD-10-CM | POA: Diagnosis not present

## 2021-07-19 DIAGNOSIS — I440 Atrioventricular block, first degree: Secondary | ICD-10-CM | POA: Diagnosis not present

## 2021-07-19 DIAGNOSIS — Z923 Personal history of irradiation: Secondary | ICD-10-CM

## 2021-07-19 DIAGNOSIS — I059 Rheumatic mitral valve disease, unspecified: Secondary | ICD-10-CM | POA: Diagnosis not present

## 2021-07-19 DIAGNOSIS — Z86718 Personal history of other venous thrombosis and embolism: Secondary | ICD-10-CM

## 2021-07-19 DIAGNOSIS — L8990 Pressure ulcer of unspecified site, unspecified stage: Secondary | ICD-10-CM | POA: Diagnosis present

## 2021-07-19 DIAGNOSIS — L89154 Pressure ulcer of sacral region, stage 4: Secondary | ICD-10-CM | POA: Diagnosis present

## 2021-07-19 DIAGNOSIS — Z66 Do not resuscitate: Secondary | ICD-10-CM | POA: Diagnosis not present

## 2021-07-19 DIAGNOSIS — R532 Functional quadriplegia: Secondary | ICD-10-CM | POA: Diagnosis present

## 2021-07-19 DIAGNOSIS — I5032 Chronic diastolic (congestive) heart failure: Secondary | ICD-10-CM | POA: Diagnosis present

## 2021-07-19 DIAGNOSIS — Z7189 Other specified counseling: Secondary | ICD-10-CM | POA: Diagnosis not present

## 2021-07-19 DIAGNOSIS — Z8673 Personal history of transient ischemic attack (TIA), and cerebral infarction without residual deficits: Secondary | ICD-10-CM

## 2021-07-19 DIAGNOSIS — R066 Hiccough: Secondary | ICD-10-CM | POA: Diagnosis not present

## 2021-07-19 DIAGNOSIS — M21372 Foot drop, left foot: Secondary | ICD-10-CM | POA: Diagnosis present

## 2021-07-19 DIAGNOSIS — B376 Candidal endocarditis: Secondary | ICD-10-CM | POA: Diagnosis present

## 2021-07-19 DIAGNOSIS — Z9079 Acquired absence of other genital organ(s): Secondary | ICD-10-CM

## 2021-07-19 DIAGNOSIS — K838 Other specified diseases of biliary tract: Secondary | ICD-10-CM | POA: Diagnosis present

## 2021-07-19 DIAGNOSIS — R17 Unspecified jaundice: Secondary | ICD-10-CM | POA: Diagnosis present

## 2021-07-19 DIAGNOSIS — A419 Sepsis, unspecified organism: Secondary | ICD-10-CM | POA: Diagnosis not present

## 2021-07-19 DIAGNOSIS — L89890 Pressure ulcer of other site, unstageable: Secondary | ICD-10-CM | POA: Diagnosis present

## 2021-07-19 DIAGNOSIS — Z79891 Long term (current) use of opiate analgesic: Secondary | ICD-10-CM

## 2021-07-19 DIAGNOSIS — Z86711 Personal history of pulmonary embolism: Secondary | ICD-10-CM

## 2021-07-19 DIAGNOSIS — M109 Gout, unspecified: Secondary | ICD-10-CM | POA: Diagnosis present

## 2021-07-19 DIAGNOSIS — E870 Hyperosmolality and hypernatremia: Secondary | ICD-10-CM | POA: Diagnosis not present

## 2021-07-19 LAB — CBC WITH DIFFERENTIAL/PLATELET
Abs Immature Granulocytes: 0 10*3/uL (ref 0.00–0.07)
Band Neutrophils: 9 %
Basophils Absolute: 0.1 10*3/uL (ref 0.0–0.1)
Basophils Relative: 1 %
Eosinophils Absolute: 0 10*3/uL (ref 0.0–0.5)
Eosinophils Relative: 0 %
HCT: 45 % (ref 39.0–52.0)
Hemoglobin: 13.9 g/dL (ref 13.0–17.0)
Lymphocytes Relative: 3 %
Lymphs Abs: 0.4 10*3/uL — ABNORMAL LOW (ref 0.7–4.0)
MCH: 31.3 pg (ref 26.0–34.0)
MCHC: 30.9 g/dL (ref 30.0–36.0)
MCV: 101.4 fL — ABNORMAL HIGH (ref 80.0–100.0)
Monocytes Absolute: 0.2 10*3/uL (ref 0.1–1.0)
Monocytes Relative: 2 %
Neutro Abs: 11.5 10*3/uL — ABNORMAL HIGH (ref 1.7–7.7)
Neutrophils Relative %: 85 %
Platelets: 301 10*3/uL (ref 150–400)
RBC: 4.44 MIL/uL (ref 4.22–5.81)
RDW: 21.2 % — ABNORMAL HIGH (ref 11.5–15.5)
WBC: 12.2 10*3/uL — ABNORMAL HIGH (ref 4.0–10.5)
nRBC: 0 % (ref 0.0–0.2)

## 2021-07-19 LAB — GLUCOSE, CAPILLARY
Glucose-Capillary: 123 mg/dL — ABNORMAL HIGH (ref 70–99)
Glucose-Capillary: 47 mg/dL — ABNORMAL LOW (ref 70–99)

## 2021-07-19 LAB — BASIC METABOLIC PANEL
Anion gap: 10 (ref 5–15)
BUN: 112 mg/dL — ABNORMAL HIGH (ref 6–20)
CO2: 13 mmol/L — ABNORMAL LOW (ref 22–32)
Calcium: 9.7 mg/dL (ref 8.9–10.3)
Chloride: 120 mmol/L — ABNORMAL HIGH (ref 98–111)
Creatinine, Ser: 2.43 mg/dL — ABNORMAL HIGH (ref 0.61–1.24)
GFR, Estimated: 30 mL/min — ABNORMAL LOW (ref >60)
Glucose, Bld: 63 mg/dL — ABNORMAL LOW (ref 70–99)
Potassium: 5 mmol/L (ref 3.5–5.1)
Sodium: 143 mmol/L (ref 135–145)

## 2021-07-19 LAB — URINALYSIS, ROUTINE W REFLEX MICROSCOPIC
Bilirubin Urine: NEGATIVE
Glucose, UA: NEGATIVE mg/dL
Ketones, ur: NEGATIVE mg/dL
Nitrite: NEGATIVE
Protein, ur: >=300 mg/dL — AB
RBC / HPF: >50 RBC/hpf — ABNORMAL HIGH (ref 0–5)
Specific Gravity, Urine: 1.012 (ref 1.005–1.030)
WBC, UA: >50 WBC/hpf — ABNORMAL HIGH (ref 0–5)
pH: 8 (ref 5.0–8.0)

## 2021-07-19 LAB — PROTIME-INR
INR: 1.5 — ABNORMAL HIGH (ref 0.8–1.2)
Prothrombin Time: 17.8 seconds — ABNORMAL HIGH (ref 11.4–15.2)

## 2021-07-19 LAB — BLOOD GAS, VENOUS
Acid-base deficit: 10.2 mmol/L — ABNORMAL HIGH (ref 0.0–2.0)
Bicarbonate: 14.4 mmol/L — ABNORMAL LOW (ref 20.0–28.0)
O2 Saturation: 30.1 %
Patient temperature: 37
pCO2, Ven: 28 mmHg — ABNORMAL LOW (ref 44–60)
pH, Ven: 7.32 (ref 7.25–7.43)
pO2, Ven: <31 mmHg — CL (ref 32–45)

## 2021-07-19 LAB — PHOSPHORUS: Phosphorus: 3.6 mg/dL (ref 2.5–4.6)

## 2021-07-19 LAB — COMPREHENSIVE METABOLIC PANEL
ALT: 21 U/L (ref 0–44)
AST: 40 U/L (ref 15–41)
Albumin: 3.2 g/dL — ABNORMAL LOW (ref 3.5–5.0)
Alkaline Phosphatase: 64 U/L (ref 38–126)
Anion gap: 12 (ref 5–15)
BUN: 118 mg/dL — ABNORMAL HIGH (ref 6–20)
CO2: 14 mmol/L — ABNORMAL LOW (ref 22–32)
Calcium: 10.8 mg/dL — ABNORMAL HIGH (ref 8.9–10.3)
Chloride: 115 mmol/L — ABNORMAL HIGH (ref 98–111)
Creatinine, Ser: 2.87 mg/dL — ABNORMAL HIGH (ref 0.61–1.24)
GFR, Estimated: 24 mL/min — ABNORMAL LOW (ref >60)
Glucose, Bld: 76 mg/dL (ref 70–99)
Potassium: 5.6 mmol/L — ABNORMAL HIGH (ref 3.5–5.1)
Sodium: 141 mmol/L (ref 135–145)
Total Bilirubin: 1.5 mg/dL — ABNORMAL HIGH (ref 0.3–1.2)
Total Protein: 8 g/dL (ref 6.5–8.1)

## 2021-07-19 LAB — LACTIC ACID, PLASMA
Lactic Acid, Venous: 5 mmol/L (ref 0.5–1.9)
Lactic Acid, Venous: 3.6 mmol/L (ref 0.5–1.9)

## 2021-07-19 LAB — TSH: TSH: 4.897 u[IU]/mL — ABNORMAL HIGH (ref 0.350–4.500)

## 2021-07-19 LAB — CK: Total CK: 401 U/L — ABNORMAL HIGH (ref 49–397)

## 2021-07-19 LAB — RESP PANEL BY RT-PCR (FLU A&B, COVID) ARPGX2
Influenza A by PCR: NEGATIVE
Influenza B by PCR: NEGATIVE
SARS Coronavirus 2 by RT PCR: NEGATIVE

## 2021-07-19 LAB — APTT: aPTT: 22 seconds — ABNORMAL LOW (ref 24–36)

## 2021-07-19 LAB — PROCALCITONIN: Procalcitonin: 20.4 ng/mL

## 2021-07-19 LAB — MAGNESIUM: Magnesium: 1.9 mg/dL (ref 1.7–2.4)

## 2021-07-19 LAB — OSMOLALITY: Osmolality: 336 mOsm/kg (ref 275–295)

## 2021-07-19 MED ORDER — OXYCODONE HCL 5 MG PO TABS
5.0000 mg | ORAL_TABLET | Freq: Four times a day (QID) | ORAL | Status: DC | PRN
  Administered 2021-07-20 – 2021-07-22 (×4): 5 mg via ORAL
  Filled 2021-07-19 (×4): qty 1

## 2021-07-19 MED ORDER — MIDODRINE HCL 5 MG PO TABS
2.5000 mg | ORAL_TABLET | Freq: Three times a day (TID) | ORAL | Status: DC
  Administered 2021-07-20 – 2021-07-22 (×5): 2.5 mg via ORAL
  Filled 2021-07-19 (×5): qty 1

## 2021-07-19 MED ORDER — ORAL CARE MOUTH RINSE: 15.0000 mL | OROMUCOSAL | Status: DC | PRN

## 2021-07-19 MED ORDER — ACETAMINOPHEN 325 MG PO TABS
650.0000 mg | ORAL_TABLET | Freq: Once | ORAL | Status: AC
  Administered 2021-07-19: 650 mg via ORAL
  Filled 2021-07-19: qty 2

## 2021-07-19 MED ORDER — ACETAMINOPHEN 325 MG PO TABS: 650.0000 mg | ORAL_TABLET | Freq: Four times a day (QID) | ORAL | Status: DC | PRN

## 2021-07-19 MED ORDER — VANCOMYCIN HCL IN DEXTROSE 1-5 GM/200ML-% IV SOLN
1000.0000 mg | Freq: Once | INTRAVENOUS | Status: AC
  Administered 2021-07-19: 1000 mg via INTRAVENOUS
  Filled 2021-07-19: qty 200

## 2021-07-19 MED ORDER — DEXTROSE-NACL 5-0.45 % IV SOLN: INTRAVENOUS | Status: DC

## 2021-07-19 MED ORDER — ENSURE ENLIVE PO LIQD
237.0000 mL | Freq: Three times a day (TID) | ORAL | Status: DC
  Administered 2021-07-20 – 2021-07-22 (×5): 237 mL via ORAL

## 2021-07-19 MED ORDER — LACTATED RINGERS IV BOLUS
1000.0000 mL | Freq: Once | INTRAVENOUS | Status: AC
  Administered 2021-07-19: 1000 mL via INTRAVENOUS

## 2021-07-19 MED ORDER — FAMOTIDINE 20 MG PO TABS
20.0000 mg | ORAL_TABLET | Freq: Every day | ORAL | Status: DC
  Administered 2021-07-21 – 2021-07-22 (×2): 20 mg via ORAL
  Filled 2021-07-19 (×2): qty 1

## 2021-07-19 MED ORDER — SODIUM CHLORIDE 0.9 % IV SOLN: 2.0000 g | INTRAVENOUS | Status: DC

## 2021-07-19 MED ORDER — CHLORHEXIDINE GLUCONATE CLOTH 2 % EX PADS
6.0000 | MEDICATED_PAD | Freq: Every day | CUTANEOUS | Status: DC
  Administered 2021-07-21 – 2021-07-22 (×2): 6 via TOPICAL

## 2021-07-19 MED ORDER — LACTATED RINGERS IV BOLUS
2000.0000 mL | Freq: Once | INTRAVENOUS | Status: AC
  Administered 2021-07-19: 2000 mL via INTRAVENOUS

## 2021-07-19 MED ORDER — LACTATED RINGERS IV SOLN
INTRAVENOUS | Status: AC
Start: 2021-07-19 — End: 2021-07-20

## 2021-07-19 MED ORDER — METOCLOPRAMIDE HCL 5 MG/ML IJ SOLN
5.0000 mg | Freq: Four times a day (QID) | INTRAMUSCULAR | Status: DC | PRN
  Administered 2021-07-22: 5 mg via INTRAVENOUS
  Filled 2021-07-19 (×3): qty 2

## 2021-07-19 MED ORDER — SODIUM CHLORIDE 0.9 % IV SOLN: INTRAVENOUS | Status: DC

## 2021-07-19 MED ORDER — ACETAMINOPHEN 650 MG RE SUPP: 650.0000 mg | Freq: Four times a day (QID) | RECTAL | Status: DC | PRN

## 2021-07-19 MED ORDER — DOLUTEGRAVIR SODIUM 50 MG PO TABS
50.0000 mg | ORAL_TABLET | Freq: Every day | ORAL | Status: DC
  Administered 2021-07-21 – 2021-07-22 (×2): 50 mg via ORAL
  Filled 2021-07-19 (×3): qty 1

## 2021-07-19 MED ORDER — ORAL CARE MOUTH RINSE
15.0000 mL | OROMUCOSAL | Status: DC
  Administered 2021-07-20 – 2021-07-23 (×12): 15 mL via OROMUCOSAL

## 2021-07-19 MED ORDER — RILPIVIRINE HCL 25 MG PO TABS
25.0000 mg | ORAL_TABLET | Freq: Every day | ORAL | Status: DC
  Administered 2021-07-21 – 2021-07-22 (×2): 25 mg via ORAL
  Filled 2021-07-19 (×3): qty 1

## 2021-07-19 MED ORDER — DEXTROSE 50 % IV SOLN
1.0000 | INTRAVENOUS | Status: DC | PRN
  Administered 2021-07-19: 50 mL via INTRAVENOUS
  Filled 2021-07-19: qty 50

## 2021-07-19 MED ORDER — METOCLOPRAMIDE HCL 5 MG/ML IJ SOLN
5.0000 mg | Freq: Four times a day (QID) | INTRAMUSCULAR | Status: DC | PRN
  Administered 2021-07-19 – 2021-07-22 (×5): 5 mg via INTRAVENOUS
  Filled 2021-07-19 (×3): qty 2

## 2021-07-19 MED ORDER — SODIUM CHLORIDE 0.9 % IV BOLUS (SEPSIS)
1000.0000 mL | Freq: Once | INTRAVENOUS | Status: AC
Start: 2021-07-19 — End: 2021-07-19
  Administered 2021-07-19: 1000 mL via INTRAVENOUS

## 2021-07-19 MED ORDER — VORICONAZOLE 200 MG PO TABS
200.0000 mg | ORAL_TABLET | Freq: Two times a day (BID) | ORAL | Status: DC
  Administered 2021-07-20 – 2021-07-22 (×5): 200 mg via ORAL
  Filled 2021-07-19 (×7): qty 1

## 2021-07-19 MED ORDER — SODIUM CHLORIDE 0.9 % IV SOLN
2.0000 g | Freq: Once | INTRAVENOUS | Status: AC
  Administered 2021-07-19: 2 g via INTRAVENOUS
  Filled 2021-07-19: qty 12.5

## 2021-07-19 MED ORDER — VANCOMYCIN HCL IN DEXTROSE 1-5 GM/200ML-% IV SOLN: 1000.0000 mg | INTRAVENOUS | Status: DC

## 2021-07-19 NOTE — ED Provider Notes (Addendum)
[place] DEPT Provider Note   CSN: [id number] Arrival date & time: 07/19/21  1538     History  Chief Complaint  Patient presents with  . Code Sepsis    [name] [name] is a 61 y.o. male.  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  According to EMS, patient resides at [facility] care, and EMS was called because he was found hypotensive.  EMS was also informed that patient has abnormal labs.  Upon EMS arrival, patient was found to be hypotensive.  He has received 100 cc of IV bolus in route.   Patient states that he has been feeling unwell for the last 2 days.  He denies any new headaches, neck pain, cough, chest pain, shortness of breath.  He complains of some lower quadrant abdominal pain, nausea.  He denies any new pain in his back.  Indicates that he has had history of sepsis and UTI in the past.  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  Home Medications Prior to Admission medications   Medication Sig Start Date End Date Taking? Authorizing Provider  acetaminophen (TYLENOL) 325 MG tablet Take 2 tablets (650 mg total) by mouth every 6 (six) hours as needed for mild pain (or Fever >/= 101). 12/23/20   Raiford Noble Latif, DO  cyanocobalamin 100 MCG tablet Take 100 mcg by mouth daily.    [provider]  cyclobenzaprine (FLEXERIL) 10 MG tablet Take 10 mg by mouth at bedtime. 02/09/21   [provider]  darunavir-cobicistat (PREZCOBIX) 800-150 MG tablet TAKE 1 TABLET BY MOUTH DAILY . SWALLOW WHOLE DO NOT CRUSH, BREAK OR CHEW TABLETS WITH FOOD. 06/09/21   [name], MD  dolutegravir (TIVICAY) 50 MG tablet Take 1 tablet (50 mg total) by mouth daily. 05/13/21   [name], MD  famotidine (PEPCID) 20 MG tablet Take 1 tablet (20 mg total) by mouth daily. 12/23/20   Raiford Noble Latif, DO  feeding supplement (ENSURE ENLIVE / ENSURE PLUS) LIQD Take 237 mLs by mouth 3 (three) times daily between meals. 05/24/21   Hongalgi, Lenis Dickinson, MD  megestrol (MEGACE ES) 625 MG/5ML suspension Take 5 mLs (625 mg total) by mouth daily. 06/09/21   [name], MD  Multiple Vitamin (MULTIVITAMIN WITH MINERALS) TABS tablet Take 1 tablet by mouth daily. 05/24/21   Hongalgi, Lenis Dickinson, MD  oxycodone (OXY-IR) 5 MG capsule Take 1 capsule (5 mg total) by mouth See admin instructions. Give '5mg'$  by mouth daily for wound care. May also give '5mg'$  every 6 hours as needed for chronic pain Patient taking differently: Take 5 mg by mouth every 6 (six) hours as needed for pain. 03/23/21   [place] Donnamarie Poag, DO  polycarbophil (FIBERCON) 625 MG tablet Take 625 mg by mouth daily.    [provider]  rilpivirine (EDURANT) 25 MG TABS tablet TAKE 1 TABLET BY MOUTH DAILY WITH BREAKFAST 06/09/21   [name], MD  senna-docusate (SENOKOT-S) 8.6-50 MG tablet Take 1 tablet by mouth 2 (two) times daily. 05/24/21   Hongalgi, Lenis Dickinson, MD  simethicone (MYLICON) 80 MG chewable tablet Chew 80 mg by mouth with breakfast, with lunch, and with evening meal.    [provider]  VFEND 200 MG tablet Take 1 tablet (200 mg total) by mouth 2 (two) times daily. 05/12/21   [name], MD  vitamin C (ASCORBIC ACID) 500 MG tablet Take 1,000 mg by mouth daily.    [provider]  zinc sulfate 220 (50 Zn) MG capsule Take 1 capsule (220 mg total) by mouth daily. 12/24/20   Raiford Noble Latif, DO      Allergies    Collagen    Review of Systems   Review of Systems  All other systems reviewed and are negative.   Physical Exam Updated Vital Signs BP 100/78   Pulse (!) 116   Temp 97.9 F (36.6 C) (Oral)   Resp (!) 22   Ht '6\' 2"'$  (1.88 m)   Wt 58.1 kg   SpO2 100%   BMI 16.43 kg/m   Physical Exam Vitals and nursing note reviewed.  Constitutional:      Appearance: He is well-developed. He is ill-appearing.  HENT:     Head: Atraumatic.  Eyes:     Extraocular Movements: Extraocular movements intact.     Pupils: Pupils are equal, round, and reactive to light.  Cardiovascular:     Rate and Rhythm: Tachycardia present.  Pulmonary:     Effort: Pulmonary effort is normal.  Abdominal:     Tenderness: There is abdominal tenderness.     Comments: Lower quadrant abdominal tenderness  Genitourinary:    Comments: Chronic indwelling Foley catheter in place. Musculoskeletal:     Cervical back: Neck supple.  Skin:    General: Skin is warm.     Comments: Patient has a large pressure ulcer over his sacrum, it is covered with stool, but appears pink and there is no drainage.  Neurological:     Mental Status: He is alert and oriented to person, place, and time.     ED Results / Procedures / Treatments   Labs (all labs ordered are listed, but only abnormal results are displayed) Labs Reviewed  COMPREHENSIVE METABOLIC PANEL - Abnormal; Notable for the following components:      Result Value   Potassium 5.6 (*)    Chloride 115 (*)    CO2 14 (*)    BUN 118 (*)    Creatinine, Ser 2.87 (*)    Calcium 10.8 (*)    Albumin 3.2 (*)    Total Bilirubin 1.5 (*)    GFR, Estimated 24 (*)    All other components within normal limits  LACTIC ACID, PLASMA - Abnormal; Notable for the following components:   Lactic Acid, Venous 5.0 (*)    All other components within normal limits  CBC WITH DIFFERENTIAL/PLATELET - Abnormal; Notable for the following components:   WBC 12.2 (*)    MCV 101.4 (*)    RDW 21.2 (*)    Neutro Abs 11.5 (*)    Lymphs Abs 0.4 (*)    All other components within normal limits  PROTIME-INR - Abnormal; Notable for the following components:   Prothrombin Time 17.8 (*)    INR 1.5 (*)    All other components within normal limits  URINALYSIS, ROUTINE W REFLEX  MICROSCOPIC - Abnormal; Notable for the following components:   Color, Urine STRAW (*)    APPearance TURBID (*)    Hgb urine dipstick SMALL (*)    Protein, ur >=300 (*)    Leukocytes,Ua MODERATE (*)    RBC / HPF >50 (*)    WBC, UA >50 (*)    Bacteria, UA  MANY (*)    All other components within normal limits  RESP PANEL BY RT-PCR (FLU A&B, COVID) ARPGX2  CULTURE, BLOOD (ROUTINE X 2)  CULTURE, BLOOD (ROUTINE X 2)  URINE CULTURE  LACTIC ACID, PLASMA  LACTIC ACID, PLASMA  LACTIC ACID, PLASMA  APTT  URINALYSIS, COMPLETE (UACMP) WITH MICROSCOPIC  PROCALCITONIN  CK  CREATININE, URINE, RANDOM  OSMOLALITY, URINE  MAGNESIUM  PHOSPHORUS  OSMOLALITY  PREALBUMIN  SODIUM, URINE, RANDOM  TSH  HIV-1 RNA QUANT-NO REFLEX-BLD  T-HELPER CELLS (CD4) COUNT (NOT AT [facility]  BLOOD GAS, VENOUS  BASIC METABOLIC PANEL  BASIC METABOLIC PANEL  BASIC METABOLIC PANEL    EKG EKG Interpretation  Date/Time:  Tuesday July 19 2021 15:56:35 EDT Ventricular Rate:  118 PR Interval:  153 QRS Duration: 92 QT Interval:  324 QTC Calculation: 454 R Axis:   80 Text Interpretation: Sinus tachycardia Inferior infarct, acute (LCx) Lateral leads are also involved Nonspecific ST abnormality Confirmed by Varney Biles ([id number]) on 07/19/2021 5:05:27 PM  Radiology DG Chest 2 View  Result Date: 07/19/2021 CLINICAL DATA:  Suspected Sepsis Hypotension.  Abnormal labs. EXAM: CHEST - 2 VIEW COMPARISON:  Chest radiograph 03/17/2021, chest CT 06/22/2014 FINDINGS: The heart is normal in size. Stable mediastinal contours. No acute airspace disease. No pulmonary edema, pleural effusion, or pneumothorax. Apical blebs and scarring again seen. IMPRESSION: No acute chest findings. Electronically Signed   By: Keith Rake M.D.   On: 07/19/2021 16:59    Procedures .Critical Care  Performed by: Varney Biles, MD Authorized by: Varney Biles, MD   Critical care provider statement:    Critical care time (minutes):  48    Critical care was necessary to treat or prevent imminent or life-threatening deterioration of the following conditions:  Shock, sepsis and renal failure   Critical care was time spent personally by me on the following activities:  Development of treatment plan with patient or surrogate, discussions with consultants, evaluation of patient's response to treatment, examination of patient, ordering and review of laboratory studies, ordering and review of radiographic studies, ordering and performing treatments and interventions, pulse oximetry, re-evaluation of patient's condition and review of old charts     Medications Ordered in ED Medications  lactated ringers infusion ( Intravenous New Bag/Given 07/19/21 1709)  sodium chloride 0.9 % bolus 1,000 mL (0 mLs Intravenous Stopped 07/19/21 1816)  ceFEPIme (MAXIPIME) 2 g in sodium chloride 0.9 % 100 mL IVPB (2 g Intravenous Bolus 07/19/21 1707)  acetaminophen (TYLENOL) tablet 650 mg (650 mg Oral Given 07/19/21 1708)  lactated ringers bolus 1,000 mL (1,000 mLs Intravenous Bolus 07/19/21 1906)    ED Course/ Medical Decision Making/ A&P Clinical Course as of 07/19/21 1953  Tue Jul 19, 2021  1835 Urinalysis, Routine w reflex microscopic Urine, Catheterized(!) Concerning for infection.  Lactic acid over 4. Another 1 L of fluid ordered.   Hemodynamic reassessment for sepsis completed.  Patient's shock index is still greater than 1.  He is stable for admission at this time. We will call hospitalist. [AN]  1953 Family updated.  Critical care consulted at the request of hospitalist. [AN]  1953 Repeat lactate pending. [AN]    Clinical Course User Index [AN] Varney Biles, MD                           Medical Decision Making Amount and/or Complexity of Data Reviewed Labs: ordered. Decision-making details documented in ED Course. Radiology: ordered. ECG/medicine tests: ordered.  Risk OTC drugs. Prescription drug management. Decision regarding  hospitalization.   This patient presents to the ED with chief complaint(s) of weakness with pertinent past medical history of HIV, endocarditis, bacteremia, pressure ulcer in his sacrum with previous osteomyelitis and sepsis which further complicates the presenting complaint. The complaint involves an extensive differential diagnosis and also carries with it a high risk of complications and morbidity.    Patient was hypotensive for EMS.  He has received finder cc of IV bolus prior to ED arrival.  BP still in the 90s over 60s.,  Patient is tachycardic.  On exam, he has a chronic indwelling Foley catheter and pressure ulcers, both putting him at risk for infection and he is immunocompromise due to his HIV with previous history of sepsis, bacteremia, endocarditis.  Patient is alert and oriented, appears sluggish.  Follows simple commands appropriately.  Denies any headache, neck pain.  The differential diagnosis includes sepsis secondary to UTI, osteomyelitis, bacteremia and endocarditis. Other possibility includes cancer mediated fevers and malaise along with intra-abdominal infection.  Abdominal exam is not consistent with peritonitis.  The initial plan is to change the Foley catheter, send a urine from the new catheter with urine cultures.  We will start patient on cefepime at this point for suspected UTI leading to sepsis.  The pressure ulcer itself does not look infected.  However, in the past he had osteomyelitis.  We will review his medication to see if he still on osteo meds.   Additional history obtained: Additional history obtained from EMS  Records reviewed previous admission documents  Independent labs interpretation:  The following labs were independently interpreted: Patient has profound uremia, acute on chronic renal failure. White count is elevated with left shift. Urine analysis concerning for infection.  Independent visualization of imaging: - I independently visualized  the following imaging with scope of interpretation limited to determining acute life threatening conditions related to emergency care: X-ray of the chest, which revealed no evidence of pneumonia or pneumothorax  Treatment and Reassessment: Patient reassessed, continues to have stable blood pressure that are within normal limits but on the lower end of patient's normal. Aggressive IV resuscitation completed.  Antibiotics given for sepsis with shock secondary to urine.   Final Clinical Impression(s) / ED Diagnoses Final diagnoses:  Septic shock ([place])  Uremia  AKI (acute kidney injury) [facility])    Rx / DC Orders ED Discharge Orders     None         Varney Biles, MD 07/19/21 Ward Chatters    Varney Biles, MD 07/19/21 1953

## 2021-07-19 NOTE — Subjective & Objective (Signed)
Came from  [employer] with hypotension Initially BP 84/64 Hr 116 Hx of chrnic osteo, endocarditis, foley catheter, hx of HIV Has not been feeling well, had some chills

## 2021-07-19 NOTE — Progress Notes (Signed)
A consult was received from an ED physician for Cefepime per pharmacy dosing (for an indication other than meningitis). The patient's profile has been reviewed for ht/wt/allergies/indication/available labs. A one time order has been placed for the above antibiotics.  Further antibiotics/pharmacy consults should be ordered by admitting physician if indicated.                       Reuel Boom, PharmD, BCPS [phone] 07/19/2021, 5:09 PM

## 2021-07-19 NOTE — Assessment & Plan Note (Signed)
Continue Ensure nutritional consult check prealbumin

## 2021-07-19 NOTE — H&P (Signed)
[name] [name] JKD:[id number] DOB: 05-Aug-1960 DOA: 07/19/2021     PCP: [name], MD   Outpatient Specialists:   CARDS:   [facility]  NEphrology: *  Dr. NEurology *   Dr. Pulmonary *  Dr.  Oncology * Dr. Fabienne Bruns* Dr.  Sadie Haber, [facility]) No care team member to display Urology Dr. Eugenie Filler  Patient arrived to ER on 07/19/21 at 1538 Referred by Attending Varney Biles, MD   Patient coming from:     From facility  [facility]  Chief Complaint:   Chief Complaint  Patient presents with   Code Sepsis    HPI: [name] [name] is a 61 y.o. 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  Functional quadriplegia  Presented with   decreased BP Denies fever at the facility  But had 100.6 in ER Came from  [facility] with hypotension Initially BP 84/64 Hr 116 Hx of chrnic osteo, endocarditis, foley catheter, hx of HIV Has not been feeling well, had some chills    Reports frequent hiccups No CP no SOB     Initial COVID TEST  NEGATIVE   Lab Results  Component Value Date   SARSCOV2NAA NEGATIVE 07/19/2021   [place] NEGATIVE 05/21/2021   [place] NEGATIVE 03/17/2021   [place] NEGATIVE 12/23/2020     Regarding pertinent Chronic problems:    ****Hyperlipidemia - *on statins {statin:315258}  Lipid Panel     Component Value Date/Time   CHOL 184 10/15/2019 1504   TRIG 74 10/15/2019 1504   HDL 105 10/15/2019 1504   CHOLHDL 1.8 10/15/2019 1504   VLDL 17 02/14/2016 1500   LDLCALC 64 10/15/2019 1504   HIV Prezcobix, TIVICAY  ***HTN on           Hx of CVA -  with/out residual deficits on Aspirin 81 mg, 325, Plavix      Hx of DVT/PE on - not on  anticoagulation    CKD stage IIIa- baseline Cr 1.4 Estimated Creatinine Clearance: 22.8 mL/min (A) (by C-G formula based on SCr of 2.87 mg/dL  (H)).  Lab Results  Component Value Date   CREATININE 2.87 (H) 07/19/2021   CREATININE 1.37 (H) 06/09/2021   CREATININE 1.01 05/24/2021     Dementia - on Aricept** Nemenda   Chronic anemia - baseline hg Hemoglobin & Hematocrit  Recent Labs    05/24/21 0346 06/09/21 0354 07/19/21 1557  HGB 9.1* 11.8* 13.9     While in ER: Clinical Course as of 07/19/21 2030  Tue Jul 19, 2021  1835 Urinalysis, Routine w reflex microscopic Urine, Catheterized(!) Concerning for infection.  Lactic acid over 4. Another 1 L of fluid ordered.   Hemodynamic reassessment for sepsis completed.  Patient's shock index is still greater than 1.  He is stable for admission at this time. We will call hospitalist. [AN]  1953 Family updated.  Critical care consulted at the request of hospitalist. [AN]  1953 Repeat lactate pending. [AN]    Clinical Course User Index [AN] Varney Biles, MD       Ordered  CT HEAD *** NON acute  CXR - ***NON acute  CTabd/pelvis - ***nonacute  CTA chest - ***nonacute, no PE, * no evidence of infiltrate  Following Medications were ordered in ER: Medications  lactated ringers infusion ( Intravenous New Bag/Given 07/19/21 1709)  sodium chloride 0.9 % bolus 1,000  mL (0 mLs Intravenous Stopped 07/19/21 1816)  ceFEPIme (MAXIPIME) 2 g in sodium chloride 0.9 % 100 mL IVPB (2 g Intravenous Bolus 07/19/21 1707)  acetaminophen (TYLENOL) tablet 650 mg (650 mg Oral Given 07/19/21 1708)  lactated ringers bolus 1,000 mL (1,000 mLs Intravenous Bolus 07/19/21 1906)    _______________________________________________________ ER Provider Called:  pCCM   Dr.Clarck They Recommend admit to medicine    SEEN in ER   ED Triage Vitals  Enc Vitals Group     BP 07/19/21 1553 95/71     Pulse Rate 07/19/21 1553 (!) 118     Resp 07/19/21 1553 20     Temp 07/19/21 1607 (!) 100.7 F (38.2 C)     Temp Source 07/19/21 1553 Oral     SpO2 07/19/21 1553 100 %     Weight 07/19/21 1556 128 lb  (58.1 kg)     Height 07/19/21 1556 '6\' 2"'$  (1.88 m)     Head Circumference --      Peak Flow --      Pain Score 07/19/21 1555 0     Pain Loc --      Pain Edu? --      Excl. in [place]? --   TMAX(24)@     _________________________________________ Significant initial  Findings: Abnormal Labs Reviewed  COMPREHENSIVE METABOLIC PANEL - Abnormal; Notable for the following components:      Result Value   Potassium 5.6 (*)    Chloride 115 (*)    CO2 14 (*)    BUN 118 (*)    Creatinine, Ser 2.87 (*)    Calcium 10.8 (*)    Albumin 3.2 (*)    Total Bilirubin 1.5 (*)    GFR, Estimated 24 (*)    All other components within normal limits  LACTIC ACID, PLASMA - Abnormal; Notable for the following components:   Lactic Acid, Venous 5.0 (*)    All other components within normal limits  CBC WITH DIFFERENTIAL/PLATELET - Abnormal; Notable for the following components:   WBC 12.2 (*)    MCV 101.4 (*)    RDW 21.2 (*)    Neutro Abs 11.5 (*)    Lymphs Abs 0.4 (*)    All other components within normal limits  PROTIME-INR - Abnormal; Notable for the following components:   Prothrombin Time 17.8 (*)    INR 1.5 (*)    All other components within normal limits  URINALYSIS, ROUTINE W REFLEX MICROSCOPIC - Abnormal; Notable for the following components:   Color, Urine STRAW (*)    APPearance TURBID (*)    Hgb urine dipstick SMALL (*)    Protein, ur >=300 (*)    Leukocytes,Ua MODERATE (*)    RBC / HPF >50 (*)    WBC, UA >50 (*)    Bacteria, UA MANY (*)    All other components within normal limits     _________________________ Troponin ***ordered ECG: Ordered Personally reviewed and interpreted by me showing: HR : *** Rhythm: *NSR, Sinus tachycardia * A.fib. W RVR, RBBB, LBBB, Paced Ischemic changes*nonspecific changes, no evidence of ischemic changes QTC*   ____________________ This patient meets SIRS Criteria and may be septic.     The recent clinical data is shown below. Vitals:    07/19/21 1815 07/19/21 1852 07/19/21 1900 07/19/21 1909  BP: 94/72 (!) 108/92 97/73   Pulse: (!) 113 (!) 121 (!) 116   Resp: (!) 21 (!) 23 (!) 22   Temp:    97.9 F (36.6  C)  TempSrc:    Oral  SpO2: 97% 97% 98%   Weight:      Height:          WBC     Component Value Date/Time   WBC 12.2 (H) 07/19/2021 1557   LYMPHSABS 0.4 (L) 07/19/2021 1557   LYMPHSABS 0.9 06/19/2008 1117   MONOABS 0.2 07/19/2021 1557   MONOABS 0.3 06/19/2008 1117   EOSABS 0.0 07/19/2021 1557   EOSABS 0.2 06/19/2008 1117   BASOSABS 0.1 07/19/2021 1557   BASOSABS 0.0 06/19/2008 1117        Lactic Acid, Venous    Component Value Date/Time   LATICACIDVEN 5.0 (HH) 07/19/2021 1557     Procalcitonin *** Ordered Lactic Acid, Venous    Component Value Date/Time   LATICACIDVEN 3.6 (HH) 07/19/2021 1907       UA *** no evidence of UTI  ***Pending ***not ordered   Urine analysis:    Component Value Date/Time   COLORURINE STRAW (A) 07/19/2021 1704   APPEARANCEUR TURBID (A) 07/19/2021 1704   LABSPEC 1.012 07/19/2021 1704   LABSPEC 1.030 04/11/2007 1446   PHURINE 8.0 07/19/2021 1704   GLUCOSEU NEGATIVE 07/19/2021 1704   HGBUR SMALL (A) 07/19/2021 1704   BILIRUBINUR NEGATIVE 07/19/2021 1704   BILIRUBINUR Negative 04/11/2007 1446   KETONESUR NEGATIVE 07/19/2021 1704   PROTEINUR >=300 (A) 07/19/2021 1704   UROBILINOGEN 0.2 06/10/2014 1303   NITRITE NEGATIVE 07/19/2021 1704   LEUKOCYTESUR MODERATE (A) 07/19/2021 1704   LEUKOCYTESUR Negative 04/11/2007 1446    Results for orders placed or performed during the hospital encounter of 07/19/21  Resp Panel by RT-PCR (Flu A&B, Covid) Urine, Catheterized     Status: None   Collection Time: 07/19/21  5:04 PM   Specimen: Urine, Catheterized; Nasal Swab  Result Value Ref Range Status   SARS Coronavirus 2 by RT PCR NEGATIVE NEGATIVE Final         Influenza A by PCR NEGATIVE NEGATIVE Final   Influenza B by PCR NEGATIVE NEGATIVE Final            _______________________________________________ Hospitalist was called for admission for   Septic shock ([place])  AKI (acute kidney injury) ([place])    The following Work up has been ordered so far:  Orders Placed This Encounter  Procedures   Critical Care   Culture, blood (Routine x 2)   Resp Panel by RT-PCR (Flu A&B, Covid) Anterior Nasal Swab   Urine Culture   DG Chest 2 View   Comprehensive metabolic panel   Lactic acid, plasma   CBC with Differential   Protime-INR   Urinalysis, Routine w reflex microscopic   Diet NPO time specified   Notify physician (specify)  Specify: Notify provider for possible Code Sepsis   Document height and weight   Foley catheter - discontinue   Insert foley catheter   Cardiac monitoring   Assess and Document Glasgow Coma Scale   Document vital signs within 1-hour of fluid bolus completion. Notify provider of abnormal vital signs despite fluid resuscitation.   DO NOT delay antibiotics if unable to obtain blood culture.   Refer to Sidebar Report: Sepsis Sidebar ED/IP   Insert peripheral IV x 2   Initiate Carrier Fluid Protocol   Code Sepsis activation.  This occurs automatically when order is signed and prioritizes pharmacy, lab, and radiology services for STAT collections and interventions.  If [facility] downtime, call [facility] [phone]) to activate Code Sepsis.   Consult to hospitalist   Pulse  oximetry, continuous   ED EKG 12-Lead     OTHER Significant initial  Findings:  labs showing:    Recent Labs  Lab 07/19/21 1557  NA 141  K 5.6*  CO2 14*  GLUCOSE 76  BUN 118*  CREATININE 2.87*  CALCIUM 10.8*    Cr   Up from baseline see below Lab Results  Component Value Date   CREATININE 2.87 (H) 07/19/2021   CREATININE 1.37 (H) 06/09/2021   CREATININE 1.01 05/24/2021    Recent Labs  Lab 07/19/21 1557  AST 40  ALT 21  ALKPHOS 64  BILITOT 1.5*  PROT 8.0  ALBUMIN 3.2*   Lab Results  Component Value Date   CALCIUM 10.8 (H)  07/19/2021   PHOS 3.5 05/21/2021       Plt: Lab Results  Component Value Date   PLT 301 07/19/2021       COVID-19 Labs  No results for input(s): "DDIMER", "FERRITIN", "LDH", "CRP" in the last 72 hours.  Lab Results  Component Value Date   SARSCOV2NAA NEGATIVE 07/19/2021   [place] NEGATIVE 05/21/2021   [place] NEGATIVE 03/17/2021   [place] NEGATIVE 12/23/2020     Arterial ***Venous  Blood Gas result:  pH *** pCO2 ***; pO2 ***;     %O2 Sat ***.  ABG    Component Value Date/Time   PHART 7.434 05/13/2020 0420   PCO2ART 22.3 (L) 05/13/2020 0420   PO2ART 103 05/13/2020 0420   HCO3 13.9 (L) 03/17/2021 1755   TCO2 15 (L) 03/17/2021 1755   ACIDBASEDEF 12.0 (H) 03/17/2021 1755   O2SAT 96 03/17/2021 1755         Recent Labs  Lab 07/19/21 1557  WBC 12.2*  NEUTROABS 11.5*  HGB 13.9  HCT 45.0  MCV 101.4*  PLT 301    HG/HCT * stable,  Down *Up from baseline see below    Component Value Date/Time   HGB 13.9 07/19/2021 1557   HGB 11.6 (L) 06/19/2008 1117   HCT 45.0 07/19/2021 1557   HCT 32.2 (L) 06/19/2008 1117   MCV 101.4 (H) 07/19/2021 1557   MCV 121.6 (H) 06/19/2008 1117      No results for input(s): "LIPASE", "AMYLASE" in the last 168 hours. No results for input(s): "AMMONIA" in the last 168 hours.    Cardiac Panel (last 3 results) No results for input(s): "CKTOTAL", "CKMB", "TROPONINI", "RELINDX" in the last 72 hours.  .car BNP (last 3 results) No results for input(s): "BNP" in the last 8760 hours.    DM  labs:  HbA1C: No results for input(s): "HGBA1C" in the last 8760 hours.     CBG (last 3)  No results for input(s): "GLUCAP" in the last 72 hours.        Cultures:    Component Value Date/Time   SDES  05/20/2021 2038    IN/OUT CATH URINE Performed at [facility], [place] [address] [place], [place] [address]    SPECREQUEST  05/20/2021 2038    NONE Performed at [facility], [place]  [address], [place], [place] [address]    CULT (A) 05/20/2021 2038    40,000 COLONIES/mL PROVIDENCIA STUARTII 40,000 COLONIES/mL CITROBACTER AMALONATICUS    REPTSTATUS 05/23/2021 FINAL 05/20/2021 2038     Radiological Exams on Admission: DG Chest 2 View  Result Date: 07/19/2021 CLINICAL DATA:  Suspected Sepsis Hypotension.  Abnormal labs. EXAM: CHEST - 2 VIEW COMPARISON:  Chest radiograph 03/17/2021, chest CT 06/22/2014 FINDINGS: The heart is normal in size.  Stable mediastinal contours. No acute airspace disease. No pulmonary edema, pleural effusion, or pneumothorax. Apical blebs and scarring again seen. IMPRESSION: No acute chest findings. Electronically Signed   By: Keith Rake M.D.   On: 07/19/2021 16:59   _______________________________________________________________________________________________________ Latest  Blood pressure 97/73, pulse (!) 116, temperature 97.9 F (36.6 C), temperature source Oral, resp. rate (!) 22, height '6\' 2"'$  (1.88 m), weight 58.1 kg, SpO2 98 %.   Vitals  labs and radiology finding personally reviewed  Review of Systems:    Pertinent positives include: ***  Constitutional:  No weight loss, night sweats, Fevers, chills, fatigue, weight loss  HEENT:  No headaches, Difficulty swallowing,Tooth/dental problems,Sore throat,  No sneezing, itching, ear ache, nasal congestion, post nasal drip,  Cardio-vascular:  No chest pain, Orthopnea, PND, anasarca, dizziness, palpitations.no Bilateral lower extremity swelling  GI:  No heartburn, indigestion, abdominal pain, nausea, vomiting, diarrhea, change in bowel habits, loss of appetite, melena, blood in stool, hematemesis Resp:  no shortness of breath at rest. No dyspnea on exertion, No excess mucus, no productive cough, No non-productive cough, No coughing up of blood.No change in color of mucus.No wheezing. Skin:  no rash or lesions. No jaundice GU:  no dysuria, change in color of urine, no urgency or  frequency. No straining to urinate.  No flank pain.  Musculoskeletal:  No joint pain or no joint swelling. No decreased range of motion. No back pain.  Psych:  No change in mood or affect. No depression or anxiety. No memory loss.  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    HIV (human immunodeficiency virus infection) ([place])    Hx of sepsis 2015   affected rt hip / femur   Pain in limb 07/23/2013   PONV (postoperative nausea and vomiting)    Hiccups for 4 days after anesthesia   Radiation    Rectal cancer (HCC)    Squamous cell   Shingles    left shoulder, right leg      Past Surgical History:  Procedure Laterality Date   COLONOSCOPY     CONVERSION TO TOTAL HIP Right 06/15/2014   Procedure: REMOVAL OF ANTIBIOTIC  SPACER AND CONVERSION TO RIGHT TOTAL HIP  ARTHROPLASTY ;  Surgeon: Paralee Cancel, MD;  Location: [facility];  Service: Orthopedics;  Laterality: Right;   CYSTOSCOPY     FLEXIBLE SIGMOIDOSCOPY N/A 12/17/2020   Procedure: FLEXIBLE SIGMOIDOSCOPY;  Surgeon: Gatha Mayer, MD;  Location: [facility];  Service: Endoscopy;  Laterality: N/A;   HEMATOMA EVACUATION     HERNIA REPAIR     right X2   INCISION AND DRAINAGE HIP Right 07/31/2013   Procedure: IRRIGATION AND DEBRIDEMENT HIP WITH PERCUTANEOUS DRAIN PLACEMENT;  Surgeon: [name], MD;  Location: [place];  Service: Orthopedics;  Laterality:  Right;   INCISION AND DRAINAGE HIP Right 03/15/2015   Procedure: IRRIGATION AND DEBRIDEMENT RIGHT HIP WITH  HEAD  BALL  AND POLY LINER EXCHANGE;  Surgeon: Paralee Cancel, MD;  Location: [facility];  Service: Orthopedics;  Laterality: Right;   INCISION AND DRAINAGE OF WOUND N/A 08/25/2013   Procedure: IRRIGATION AND DEBRIDEMENT OF SACRAL ULCER WITH PLACEMENT OF A CELL ;  Surgeon: Theodoro Kos, DO;  Location: [place];  Service: Plastics;  Laterality: N/A;   INCISION AND DRAINAGE OF WOUND N/A 09/01/2013   Procedure: IRRIGATION AND DEBRIDEMENT SACRAL ULCER WITH PLACEMENT OF A CELL;  Surgeon: Theodoro Kos, DO;  Location: [facility];  Service: Plastics;  Laterality: N/A;   INCISION AND DRAINAGE OF WOUND N/A 09/08/2013   Procedure: IRRIGATION AND DEBRIDEMENT OF SACRAL ULCER WITH PLACEMENT OF A CELL AND VAC;  Surgeon: Theodoro Kos, DO;  Location: [place];  Service: Plastics;  Laterality: N/A;   INCISION AND DRAINAGE OF WOUND N/A 09/18/2013   Procedure: IRRIGATION AND DEBRIDEMENT OF SACRAL WOUND WITH PLACEMENT OF A-CELL;  Surgeon: Theodoro Kos, DO;  Location: [facility];  Service: Plastics;  Laterality: N/A;   PILONIDAL CYST DRAINAGE N/A 08/09/2013   Procedure: IRRIGATION AND DEBRIDEMENT SACRAL DECUBITUS;  Surgeon: Harl Bowie, MD;  Location: [place];  Service: General;  Laterality: N/A;   RECTAL BIOPSY N/A 03/29/2020   Procedure: EXCISION OF PERIANAL TISSUE WITH BX AND 3 PUNCH BXS;  Surgeon: Ileana Roup, MD;  [facility];  Service: General;  Laterality: N/A;   TEE WITHOUT CARDIOVERSION N/A 03/22/2021   Procedure: TRANSESOPHAGEAL ECHOCARDIOGRAM (TEE);  Surgeon: Buford Dresser, MD;  [facility];  Service: Cardiovascular;  Laterality: N/A;   TOTAL HIP ARTHROPLASTY Right 08/04/2013   Procedure: RESECTION HIP JOINT - PLACEMENT OF CEMENT PROSTHESIS;  Surgeon: [name], MD;  Location: [facility];  Service: Orthopedics;  Laterality: Right;   TRANSURETHRAL RESECTION OF PROSTATE  JAN 2016    Social  History:  Ambulatory  bed bound     reports that he has quit smoking. His smoking use included cigarettes. He smoked an average of .3 packs per day. He has never used smokeless tobacco. He reports that he does not currently use alcohol after a past usage of about 3.0 standard drinks of alcohol per week. He reports that he does not currently use drugs after having used the following drugs: Marijuana.     Family History:   Family History  Problem Relation Age of Onset   Diabetes Mother    Dementia Mother    Atrial fibrillation Mother    Hypertension Mother    Cancer Father    Emphysema Father    ______________________________________________________________________________________________ Allergies: Allergies  Allergen Reactions   Collagen Rash    redness     Prior to Admission medications   Medication Sig Start Date End Date Taking? Authorizing Provider  acetaminophen (TYLENOL) 325 MG tablet Take 2 tablets (650 mg total) by mouth every 6 (six) hours as needed for mild pain (or Fever >/= 101). 12/23/20   Raiford Noble Latif, DO  cyanocobalamin 100 MCG tablet Take 100 mcg by mouth daily.    [provider]  cyclobenzaprine (FLEXERIL) 10 MG tablet Take 10 mg by mouth at bedtime. 02/09/21   [provider]  darunavir-cobicistat (PREZCOBIX) 800-150 MG tablet TAKE 1 TABLET BY MOUTH DAILY . SWALLOW WHOLE DO NOT CRUSH, BREAK OR CHEW TABLETS WITH FOOD. 06/09/21   [name], MD  dolutegravir (TIVICAY) 50 MG tablet Take 1 tablet (50 mg total) by mouth daily. 05/13/21   [name], MD  famotidine (PEPCID) 20 MG  tablet Take 1 tablet (20 mg total) by mouth daily. 12/23/20   Raiford Noble Latif, DO  feeding supplement (ENSURE ENLIVE / ENSURE PLUS) LIQD Take 237 mLs by mouth 3 (three) times daily between meals. 05/24/21   Hongalgi, Lenis Dickinson, MD  megestrol (MEGACE ES) 625 MG/5ML suspension Take 5 mLs (625 mg total) by mouth daily. 06/09/21   [name], MD   Multiple Vitamin (MULTIVITAMIN WITH MINERALS) TABS tablet Take 1 tablet by mouth daily. 05/24/21   Hongalgi, Lenis Dickinson, MD  oxycodone (OXY-IR) 5 MG capsule Take 1 capsule (5 mg total) by mouth See admin instructions. Give '5mg'$  by mouth daily for wound care. May also give '5mg'$  every 6 hours as needed for chronic pain Patient taking differently: Take 5 mg by mouth every 6 (six) hours as needed for pain. 03/23/21   [place] Donnamarie Poag, DO  polycarbophil (FIBERCON) 625 MG tablet Take 625 mg by mouth daily.    [provider]  rilpivirine (EDURANT) 25 MG TABS tablet TAKE 1 TABLET BY MOUTH DAILY WITH BREAKFAST 06/09/21   [name], MD  senna-docusate (SENOKOT-S) 8.6-50 MG tablet Take 1 tablet by mouth 2 (two) times daily. 05/24/21   Hongalgi, Lenis Dickinson, MD  simethicone (MYLICON) 80 MG chewable tablet Chew 80 mg by mouth with breakfast, with lunch, and with evening meal.    [provider]  VFEND 200 MG tablet Take 1 tablet (200 mg total) by mouth 2 (two) times daily. 05/12/21   [name], MD  vitamin C (ASCORBIC ACID) 500 MG tablet Take 1,000 mg by mouth daily.    [provider]  zinc sulfate 220 (50 Zn) MG capsule Take 1 capsule (220 mg total) by mouth daily. 12/24/20   Kerney Elbe, DO    ___________________________________________________________________________________________________ Physical Exam:    07/19/2021    7:00 PM 07/19/2021    6:52 PM 07/19/2021    6:15 PM  Vitals with BMI  Systolic 97 248 94  Diastolic 73 92 72  Pulse 250 121 113     1. General:  in No ***Acute distress***increased work of breathing ***complaining of severe pain****agitated * Chronically ill *well *cachectic *toxic acutely ill -appearing 2. Psychological: Alert and *** Oriented 3. Head/ENT:   Moist *** Dry Mucous Membranes                          Head Non traumatic, neck supple                          Normal *** Poor Dentition 4. SKIN: normal *** decreased Skin turgor,   Skin clean Dry and intact no rash 5. Heart: Regular rate and rhythm no*** Murmur, no Rub or gallop 6. Lungs: ***Clear to auscultation bilaterally, no wheezes or crackles   7. Abdomen: Soft, ***non-tender, Non distended *** obese ***bowel sounds present 8. Lower extremities: no clubbing, cyanosis, no ***edema 9. Neurologically Grossly intact, moving all 4 extremities equally *** strength 5 out of 5 in all 4 extremities cranial nerves II through XII intact 10. MSK: Normal range of motion    Chart has been reviewed  ______________________________________________________________________________________________  Assessment/Plan  ***  Admitted for *** Septic shock (HCC) ***  Uremia ***  AKI (acute kidney injury) ([place]) ***    Present on Admission: **None**     No problem-specific Assessment & Plan notes found for this encounter.    Other plan as  per orders.  DVT prophylaxis:  SCD *** Lovenox       Code Status:    Code Status: Prior FULL CODE *** DNR/DNI ***comfort care as per patient ***family  I had personally discussed CODE STATUS with patient and family* I had spent *min discussing goals of care and CODE STATUS    Family Communication:   Family not at  Bedside  plan of care was discussed on the phone with *** Son, Daughter, Wife, Husband, Sister, Brother , father, mother  Disposition Plan:   *** likely will need placement for rehabilitation                          Back to current facility when stable                            To home once workup is complete and patient is stable  ***Following barriers for discharge:                            Electrolytes corrected                               Anemia corrected                             Pain controlled with PO medications                               Afebrile, white count improving able to transition to PO antibiotics                             Will need to be able to tolerate PO                             Will likely need home health, home O2, set up                           Will need consultants to evaluate patient prior to discharge  ****EXPECT DC tomorrow                     Would benefit from PT/OT eval prior to DC  Ordered                   Swallow eval - SLP ordered                                       Transition of care consulted                   Nutrition    consulted                   Consults called: PCCM aware, please let ID know pt has been admitted  Admission status:  ED Disposition     ED Disposition  Admit   Condition  --   Comment  The patient appears reasonably stabilized for admission considering the current resources,  flow, and capabilities available in the ED at this time, and I doubt any other [facility] requiring further screening and/or treatment in the ED prior to admission is  present.           Obs***  ***  inpatient     I Expect 2 midnight stay secondary to severity of patient's current illness need for inpatient interventions justified by the following: ***hemodynamic instability despite optimal treatment (tachycardia *hypotension * tachypnea *hypoxia, hypercapnia) * Severe lab/radiological/exam abnormalities including:     and extensive comorbidities including: *substance abuse  *Chronic pain *DM2  * CHF * CAD  * COPD/asthma *Morbid Obesity * CKD *dementia *liver disease *history of stroke with residual deficits *  malignancy, * sickle cell disease  History of amputation Chronic anticoagulation  That are currently affecting medical management.   I expect  patient to be hospitalized for 2 midnights requiring inpatient medical care.  Patient is at high risk for adverse outcome (such as loss of life or disability) if not treated.  Indication for inpatient stay as follows:  Severe change from baseline regarding mental status Hemodynamic instability despite maximal medical therapy,  ongoing suicidal ideations,  severe pain requiring  acute inpatient management,  inability to maintain oral hydration   persistent chest pain despite medical management Need for operative/procedural  intervention New or worsening hypoxia   Need for IV antibiotics, IV fluids, IV rate controling medications, IV antihypertensives, IV pain medications, IV anticoagulation, need for biPAP    Level of care   *** tele  For 12H 24H     medical floor       progressive tele indefinitely please discontinue once patient no longer qualifies COVID-19 Labs    Lab Results  Component Value Date   [place] NEGATIVE 07/19/2021     Precautions: admitted as   Covid Negative          Sruti Ayllon 07/19/2021, 7:14 PM ***  [facility]     after 2 AM please page floor coverage PA If 7AM-7PM, please contact the day team taking care of the patient using [email or web address]   Patient was evaluated in the context of the global COVID-19 pandemic, which necessitated consideration that the patient might be at risk for infection with the SARS-CoV-2 virus that causes COVID-19. Institutional protocols and algorithms that pertain to the evaluation of patients at risk for COVID-19 are in a state of rapid change based on information released by regulatory bodies including the CDC and federal and state organizations. These policies and algorithms were followed during the patient's care.

## 2021-07-19 NOTE — Consult Note (Signed)
NAME:  [name] [name], MRN:  [id number], DOB:  1960-05-05, LOS: 0 ADMISSION DATE:  07/19/2021, CONSULTATION DATE: 07/19/2021 REFERRING MD: Shannan Harper, CHIEF COMPLAINT: Sepsis  History of Present Illness:  [name] [name] is a 61 year old gentleman with a history of HIV, previous endocarditis, previous osteomyelitis, baseline debility being bedbound for the past 1 to 2 years, decubitus ulcer, urinary retention requiring suprapubic catheter, recurrent urinary tract infections who presented after feeling poorly for the past 2 days.  He has had worse back pain and muscle spasms in his baseline, fevers, chills, hiccups, abdominal pain, nausea, vomiting.  His oral intake is increased recently since being started on Megace by his infectious disease physician, but over the last 2 days he has not been eating and drinking normally.  He was brought to the ED for evaluation.  He was given 2 L of crystalloid and started empirically on vancomycin, cefepime.  In the ED lactic acid was 5, PCCM was consulted for evaluation.  His wife is at bedside to corroborate history.   Pertinent  Medical History  HIV Endocarditis Urinary retention requiring suprapubic catheter Hypertension Rectal cancer DVT Anemia Gout   Significant Hospital Events: Including procedures, antibiotic start and stop dates in addition to other pertinent events   Admission 7/11  Interim History / Subjective:    Objective   Blood pressure 101/69, pulse (!) 111, temperature 97.9 F (36.6 C), temperature source Oral, resp. rate (!) 28, height '6\' 2"'$  (1.88 m), weight 58.1 kg, SpO2 96 %.       No intake or output data in the 24 hours ending 07/19/21 2012 Filed Weights   07/19/21 1556  Weight: 58.1 kg    Examination: General: Chronically ill-appearing man lying in bed in no acute distress HENT: Temporal wasting, poor dentition, very dry oral mucosa Lungs: Breathing comfortably on nasal cannula, CTA B Cardiovascular: S1-S2, minimally  tachycardic, regular rhythm Abdomen: Soft in the upper abdomen, tender to palpation in suprapubic area.  No suprapubic catheter in place currently Extremities: Minimal muscle mass, no edema Neuro: Awake, alert, answering questions inappropriately and with inaccurate answers.  Moving all extremities. Derm: Pallor, poor skin turgor  Lactic acid 5> 3.6 Calcium 5.6 Bicarb 14 BUN 118 Creatinine 2.87 Bilirubin 1.5 WBC 12.2  CT abdomen pelvis reviewed-worsening hydroureteronephrosis compared to previous, appropriately placed urinary catheter, bladder still mildly distended, thickened with fat stranding.  No obstructing calculi.  Sacral osteomyelitis.  Distended gallbladder.  Gastric distention with air-fluid level  Resolved Hospital Problem list     Assessment & Plan:  Sepsis due to for UTI Lactic acidosis -CT abdomen and pelvis-confirm suspicion for hydroureteronephrosis and dilated bladder despite Foley catheter placement. - We will consult urology> they will see him tomorrow morning. - Agree with broad-spectrum antibiotics- Vanc, cefepime -Follow urine culture and blood cultures -Follow-up lactic acid  AKI, potentially postrenal versus ATN from sepsis Significant azotemia Hyperkalemia Chronic urinary retention, status post suprapubic catheter placement - Additional volume resuscitation-2 L LR added to initial crystalloid given - Urology consulted> will see him tomorrow - Follow-up BMP  Hyperbilirubinemia, gallbladder distention on CT - RUQ [place]  HIV - Recommend resuming PTA ART  Baseline debility - We will plan for him to discharge back to SNF  Sacral decubitus ulcer - May need MRI this admission when he is more stable WOC consult recommended  Best Practice (right click and "Reselect all SmartList Selections" daily)   Per primary  Labs   CBC: Recent Labs  Lab 07/19/21 1557  WBC  12.2*  NEUTROABS 11.5*  HGB 13.9  HCT 45.0  MCV 101.4*  PLT 301    Basic  Metabolic Panel: Recent Labs  Lab 07/19/21 1557  NA 141  K 5.6*  CL 115*  CO2 14*  GLUCOSE 76  BUN 118*  CREATININE 2.87*  CALCIUM 10.8*   GFR: Estimated Creatinine Clearance: 22.8 mL/min (A) (by C-G formula based on SCr of 2.87 mg/dL (H)). Recent Labs  Lab 07/19/21 1557 07/19/21 1907  WBC 12.2*  --   LATICACIDVEN 5.0* 3.6*    Liver Function Tests: Recent Labs  Lab 07/19/21 1557  AST 40  ALT 21  ALKPHOS 64  BILITOT 1.5*  PROT 8.0  ALBUMIN 3.2*   No results for input(s): "LIPASE", "AMYLASE" in the last 168 hours. No results for input(s): "AMMONIA" in the last 168 hours.  ABG    Component Value Date/Time   PHART 7.434 05/13/2020 0420   PCO2ART 22.3 (L) 05/13/2020 0420   PO2ART 103 05/13/2020 0420   HCO3 13.9 (L) 03/17/2021 1755   TCO2 15 (L) 03/17/2021 1755   ACIDBASEDEF 12.0 (H) 03/17/2021 1755   O2SAT 96 03/17/2021 1755     Coagulation Profile: Recent Labs  Lab 07/19/21 1557  INR 1.5*    Cardiac Enzymes: No results for input(s): "CKTOTAL", "CKMB", "CKMBINDEX", "TROPONINI" in the last 168 hours.  HbA1C: Hgb A1c MFr Bld  Date/Time Value Ref Range Status  05/18/2020 10:36 AM 5.5 4.8 - 5.6 % Final    Comment:    (NOTE) Pre diabetes:          5.7%-6.4%  Diabetes:              >6.4%  Glycemic control for   <7.0% adults with diabetes   08/14/2013 05:00 AM 5.9 (H) <5.7 % Final    Comment:    (NOTE)                                                                       According to the ADA Clinical Practice Recommendations for 2011, when HbA1c is used as a screening test:  >=6.5%   Diagnostic of Diabetes Mellitus           (if abnormal result is confirmed) 5.7-6.4%   Increased risk of developing Diabetes Mellitus References:Diagnosis and Classification of Diabetes Mellitus,Diabetes FHLK,5625,63(SLHTD 1):S62-S69 and Standards of Medical Care in         Diabetes - 2011,Diabetes Care,2011,34 (Suppl 1):S11-S61.    CBG: No results for input(s):  "GLUCAP" in the last 168 hours.  Review of Systems:   Review of Systems  Constitutional:  Positive for chills and fever.  HENT:  Negative for congestion.   Respiratory:  Negative for cough and shortness of breath.   Cardiovascular:  Negative for chest pain and leg swelling.  Gastrointestinal:  Positive for abdominal pain, constipation, nausea and vomiting.  Musculoskeletal:  Positive for back pain.  Skin:  Negative for rash.  Neurological:  Positive for weakness.     Past Medical History:  He,  has a past medical history of Arthritis, Decubitus ulcer of sacral area, DVT (deep venous thrombosis) ([place]) (2008), Exposure to hepatitis B, H/O hypotension, History of anemia, History of gout, History of transfusion, History of urinary retention,  HIV (human immunodeficiency virus infection) ([place]), sepsis (2015), Pain in limb (07/23/2013), PONV (postoperative nausea and vomiting), Radiation, Rectal cancer ([place]), and Shingles.   Surgical History:   Past Surgical History:  Procedure Laterality Date   COLONOSCOPY     CONVERSION TO TOTAL HIP Right 06/15/2014   Procedure: REMOVAL OF ANTIBIOTIC  SPACER AND CONVERSION TO RIGHT TOTAL HIP  ARTHROPLASTY ;  Surgeon: Paralee Cancel, MD;  Location: [facility];  Service: Orthopedics;  Laterality: Right;   CYSTOSCOPY     FLEXIBLE SIGMOIDOSCOPY N/A 12/17/2020   Procedure: FLEXIBLE SIGMOIDOSCOPY;  Surgeon: Gatha Mayer, MD;  Location: [facility];  Service: Endoscopy;  Laterality: N/A;   HEMATOMA EVACUATION     HERNIA REPAIR     right X2   INCISION AND DRAINAGE HIP Right 07/31/2013   Procedure: IRRIGATION AND DEBRIDEMENT HIP WITH PERCUTANEOUS DRAIN PLACEMENT;  Surgeon: [name], MD;  Location: [place];  Service: Orthopedics;  Laterality: Right;   INCISION AND DRAINAGE HIP Right 03/15/2015   Procedure: IRRIGATION AND DEBRIDEMENT RIGHT HIP WITH  HEAD BALL  AND POLY LINER EXCHANGE;  Surgeon: Paralee Cancel, MD;  Location: [facility];  Service: Orthopedics;  Laterality:  Right;   INCISION AND DRAINAGE OF WOUND N/A 08/25/2013   Procedure: IRRIGATION AND DEBRIDEMENT OF SACRAL ULCER WITH PLACEMENT OF A CELL ;  Surgeon: Theodoro Kos, DO;  Location: [place];  Service: Plastics;  Laterality: N/A;   INCISION AND DRAINAGE OF WOUND N/A 09/01/2013   Procedure: IRRIGATION AND DEBRIDEMENT SACRAL ULCER WITH PLACEMENT OF A CELL;  Surgeon: Theodoro Kos, DO;  Location: [place];  Service: Plastics;  Laterality: N/A;   INCISION AND DRAINAGE OF WOUND N/A 09/08/2013   Procedure: IRRIGATION AND DEBRIDEMENT OF SACRAL ULCER WITH PLACEMENT OF A CELL AND VAC;  Surgeon: Theodoro Kos, DO;  Location: [place];  Service: Plastics;  Laterality: N/A;   INCISION AND DRAINAGE OF WOUND N/A 09/18/2013   Procedure: IRRIGATION AND DEBRIDEMENT OF SACRAL WOUND WITH PLACEMENT OF A-CELL;  Surgeon: Theodoro Kos, DO;  Location: [place];  Service: Plastics;  Laterality: N/A;   PILONIDAL CYST DRAINAGE N/A 08/09/2013   Procedure: IRRIGATION AND DEBRIDEMENT SACRAL DECUBITUS;  Surgeon: Harl Bowie, MD;  Location: [place];  Service: General;  Laterality: N/A;   RECTAL BIOPSY N/A 03/29/2020   Procedure: EXCISION OF PERIANAL TISSUE WITH BX AND 3 PUNCH BXS;  Surgeon: Ileana Roup, MD;  [facility]: [facility];  Service: General;  Laterality: N/A;   TEE WITHOUT CARDIOVERSION N/A 03/22/2021   Procedure: TRANSESOPHAGEAL ECHOCARDIOGRAM (TEE);  Surgeon: Buford Dresser, MD;  Location[facility];  Service: Cardiovascular;  Laterality: N/A;   TOTAL HIP ARTHROPLASTY Right 08/04/2013   Procedure: RESECTION HIP JOINT - PLACEMENT OF CEMENT PROSTHESIS;  Surgeon: [name], MD;  Location: [place];  Service: Orthopedics;  Laterality: Right;   TRANSURETHRAL RESECTION OF PROSTATE  JAN 2016     Social History:   reports that he has quit smoking. His smoking use included cigarettes. He smoked an average of .3 packs per day. He has never used smokeless tobacco. He reports that he does not currently use alcohol after a past  usage of about 3.0 standard drinks of alcohol per week. He reports that he does not currently use drugs after having used the following drugs: Marijuana.   Family History:  His family history includes Atrial fibrillation in his mother; Cancer in his father; Dementia in his mother; Diabetes in his mother; Emphysema in his father; Hypertension in  his mother.   Allergies Allergies  Allergen Reactions   Collagen Rash    redness     Home Medications  Prior to Admission medications   Medication Sig Start Date End Date Taking? Authorizing Provider  acetaminophen (TYLENOL) 325 MG tablet Take 2 tablets (650 mg total) by mouth every 6 (six) hours as needed for mild pain (or Fever >/= 101). 12/23/20   Raiford Noble Latif, DO  cyanocobalamin 100 MCG tablet Take 100 mcg by mouth daily.    [provider]  cyclobenzaprine (FLEXERIL) 10 MG tablet Take 10 mg by mouth at bedtime. 02/09/21   [provider]  darunavir-cobicistat (PREZCOBIX) 800-150 MG tablet TAKE 1 TABLET BY MOUTH DAILY . SWALLOW WHOLE DO NOT CRUSH, BREAK OR CHEW TABLETS WITH FOOD. 06/09/21   [name], MD  dolutegravir (TIVICAY) 50 MG tablet Take 1 tablet (50 mg total) by mouth daily. 05/13/21   [name], MD  famotidine (PEPCID) 20 MG tablet Take 1 tablet (20 mg total) by mouth daily. 12/23/20   Raiford Noble Latif, DO  feeding supplement (ENSURE ENLIVE / ENSURE PLUS) LIQD Take 237 mLs by mouth 3 (three) times daily between meals. 05/24/21   Hongalgi, Lenis Dickinson, MD  megestrol (MEGACE ES) 625 MG/5ML suspension Take 5 mLs (625 mg total) by mouth daily. 06/09/21   [name], MD  Multiple Vitamin (MULTIVITAMIN WITH MINERALS) TABS tablet Take 1 tablet by mouth daily. 05/24/21   Hongalgi, Lenis Dickinson, MD  oxycodone (OXY-IR) 5 MG capsule Take 1 capsule (5 mg total) by mouth See admin instructions. Give '5mg'$  by mouth daily for wound care. May also give '5mg'$  every 6 hours as needed for chronic pain Patient taking  differently: Take 5 mg by mouth every 6 (six) hours as needed for pain. 03/23/21   [place] Donnamarie Poag, DO  polycarbophil (FIBERCON) 625 MG tablet Take 625 mg by mouth daily.    [provider]  rilpivirine (EDURANT) 25 MG TABS tablet TAKE 1 TABLET BY MOUTH DAILY WITH BREAKFAST 06/09/21   [name], MD  senna-docusate (SENOKOT-S) 8.6-50 MG tablet Take 1 tablet by mouth 2 (two) times daily. 05/24/21   Hongalgi, Lenis Dickinson, MD  simethicone (MYLICON) 80 MG chewable tablet Chew 80 mg by mouth with breakfast, with lunch, and with evening meal.    [provider]  VFEND 200 MG tablet Take 1 tablet (200 mg total) by mouth 2 (two) times daily. 05/12/21   [name], MD  vitamin C (ASCORBIC ACID) 500 MG tablet Take 1,000 mg by mouth daily.    [provider]  zinc sulfate 220 (50 Zn) MG capsule Take 1 capsule (220 mg total) by mouth daily. 12/24/20   Kerney Elbe, DO     Critical care time:    Julian Hy, DO 07/19/21 10:40 PM [facility]

## 2021-07-19 NOTE — ED Triage Notes (Signed)
Patient BIBA from [facility] d/t hypotension and abnormal labs per staff. BP 84/64 upon EMS arrival & HR 116. Pt clammy and diaphoretic. BP 98/68 after fluid bolus.   T 98.1 RR 24 SpO2 98% RA  CBG 402  22 g LH  500 cc NS

## 2021-07-19 NOTE — Sepsis Progress Note (Signed)
Confirmed with ED RN blood cultures were drawn at 1630, before abx given.

## 2021-07-19 NOTE — Assessment & Plan Note (Signed)
Repeat blood cultures patient was no longer on IV antibiotics at the time may need ID consult in a.m.

## 2021-07-19 NOTE — Assessment & Plan Note (Signed)
Stable chronic continue to monitor

## 2021-07-19 NOTE — Assessment & Plan Note (Signed)
Status post indwelling Foley catheter still has bilateral hydronephrosis. Noted to have AKI.  Would benefit from urology consult in a.m. no obstructing stone noted Bilateral hydro likely secondary to chronic obstruction

## 2021-07-19 NOTE — H&P (Incomplete)
[name] [name] INO:[id number] DOB: 1960/04/01 DOA: 07/19/2021     PCP: [name], MD   Outpatient Specialists:   CARDS:   Dr.Smith    ID Hatcher  Patient arrived to ER on 07/19/21 at 1538 Referred by Attending Varney Biles, MD   Patient coming from:     From facility  [facility]  Chief Complaint:   Chief Complaint  Patient presents with  . Code Sepsis    HPI: [name] [name] is a 61 y.o. 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  Functional quadriplegia  Presented with   decreased BP Denies fever at the facility  But had 100.6 in ER Came from  [facility] with hypotension Initially BP 84/64 Hr 116 Hx of chrnic osteo, endocarditis, foley catheter, hx of HIV Has not been feeling well, had some chills    Reports frequent hiccups No CP no SOB     Initial COVID TEST  NEGATIVE   Lab Results  Component Value Date   SARSCOV2NAA NEGATIVE 07/19/2021   [place] NEGATIVE 05/21/2021   [place] NEGATIVE 03/17/2021   [place] NEGATIVE 12/23/2020     Regarding pertinent Chronic problems:     HIV Prezcobix, TIVICAY          Hx of CVA -  with/out residual deficits on Aspirin 81 mg, 325, Plavix      Hx of DVT/PE on - not on  anticoagulation    CKD stage IIIa- baseline Cr 1.4 Estimated Creatinine Clearance: 22.8 mL/min (A) (by C-G formula based on SCr of 2.87 mg/dL (H)).  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  Lactic acid over 4. Another 1 L of fluid ordered.   Hemodynamic reassessment for sepsis completed.  Patient's shock index is still greater than 1.  He is stable for admission at this time. We will call hospitalist. [AN]  1953 Family updated.  Critical care consulted at the request of hospitalist. [AN]  1953 Repeat lactate pending. [AN]    Clinical Course User Index [AN] Varney Biles, MD     Ordered    CXR -  NON acute  CTabd/pelvis - Moderate to advanced bilateral hydroureteronephrosis, with progression from prior exam. No evidence of obstructing calculi. Bladder is mildly distended despite appropriate Foley catheter placement. Moderate bladder wall thickening with mild perivesicular fat stranding, suspicious for urinary tract infection. Recommend correlation with urinalysis.  Sacral decubitus ulcer with resorptive changes of the sacrum and coccyx. Progression from prior exam suggestive of osteomyelitis. Presacral soft tissue thickening without drainable abscess or collection.   Distended gallbladder without calcified gallstone. If there is clinical concern for acute cholecystitis, recommend right upper quadrant ultrasound  Following Medications were ordered in ER: Medications  lactated ringers infusion ( Intravenous New Bag/Given 07/19/21 1709)  sodium chloride 0.9 % bolus 1,000 mL (  0 mLs Intravenous Stopped 07/19/21 1816)  ceFEPIme (MAXIPIME) 2 g in sodium chloride 0.9 % 100 mL IVPB (2 g Intravenous Bolus 07/19/21 1707)  acetaminophen (TYLENOL) tablet 650 mg (650 mg Oral Given 07/19/21 1708)  lactated ringers bolus 1,000 mL (1,000 mLs Intravenous Bolus 07/19/21 1906)    _______________________________________________________ ER Provider Called:  pCCM   Dr.Clarck They Recommend admit to medicine    SEEN in ER   ED Triage Vitals   Enc Vitals Group     BP 07/19/21 1553 95/71     Pulse Rate 07/19/21 1553 (!) 118     Resp 07/19/21 1553 20     Temp 07/19/21 1607 (!) 100.7 F (38.2 C)     Temp Source 07/19/21 1553 Oral     SpO2 07/19/21 1553 100 %     Weight 07/19/21 1556 128 lb (58.1 kg)     Height 07/19/21 1556 '6\' 2"'$  (1.88 m)     Head Circumference --      Peak Flow --      Pain Score 07/19/21 1555 0     Pain Loc --      Pain Edu? --      Excl. in [place]? --   TMAX(24)@     _________________________________________ Significant initial  Findings: Abnormal Labs Reviewed  COMPREHENSIVE METABOLIC PANEL - Abnormal; Notable for the following components:      Result Value   Potassium 5.6 (*)    Chloride 115 (*)    CO2 14 (*)    BUN 118 (*)    Creatinine, Ser 2.87 (*)    Calcium 10.8 (*)    Albumin 3.2 (*)    Total Bilirubin 1.5 (*)    GFR, Estimated 24 (*)    All other components within normal limits  LACTIC ACID, PLASMA - Abnormal; Notable for the following components:   Lactic Acid, Venous 5.0 (*)    All other components within normal limits  LACTIC ACID, PLASMA - Abnormal; Notable for the following components:   Lactic Acid, Venous 3.6 (*)    All other components within normal limits  CBC WITH DIFFERENTIAL/PLATELET - Abnormal; Notable for the following components:   WBC 12.2 (*)    MCV 101.4 (*)    RDW 21.2 (*)    Neutro Abs 11.5 (*)    Lymphs Abs 0.4 (*)    All other components within normal limits  PROTIME-INR - Abnormal; Notable for the following components:   Prothrombin Time 17.8 (*)    INR 1.5 (*)    All other components within normal limits  URINALYSIS, ROUTINE W REFLEX MICROSCOPIC - Abnormal; Notable for the following components:   Color, Urine STRAW (*)    APPearance TURBID (*)    Hgb urine dipstick SMALL (*)    Protein, ur >=300 (*)    Leukocytes,Ua MODERATE (*)    RBC / HPF >50 (*)    WBC, UA >50 (*)    Bacteria, UA MANY (*)    All other components within normal limits  APTT -  Abnormal; Notable for the following components:   aPTT 22 (*)    All other components within normal limits  CK - Abnormal; Notable for the following components:   Total CK 401 (*)    All other components within normal limits  OSMOLALITY - Abnormal; Notable for the following components:   Osmolality 336 (*)    All other components within normal limits  TSH - Abnormal; Notable for the following components:  TSH 4.897 (*)    All other components within normal limits  BLOOD GAS, VENOUS - Abnormal; Notable for the following components:   pCO2, Ven 28 (*)    pO2, Ven <31 (*)    Bicarbonate 14.4 (*)    Acid-base deficit 10.2 (*)    All other components within normal limits  BASIC METABOLIC PANEL - Abnormal; Notable for the following components:   Chloride 120 (*)    CO2 13 (*)    Glucose, Bld 63 (*)    BUN 112 (*)    Creatinine, Ser 2.43 (*)    GFR, Estimated 30 (*)    All other components within normal limits  GLUCOSE, CAPILLARY - Abnormal; Notable for the following components:   Glucose-Capillary 47 (*)    All other components within normal limits    ECG: Ordered Personally reviewed and interpreted by me showing: HR : 110 Rhythm: Sinus tachycardia Right atrial enlargement Right bundle branch block Poor quality  QTC 365   ____________________ This patient meets SIRS Criteria and may be septic.    The recent clinical data is shown below. Vitals:   07/19/21 1815 07/19/21 1852 07/19/21 1900 07/19/21 1909  BP: 94/72 (!) 108/92 97/73   Pulse: (!) 113 (!) 121 (!) 116   Resp: (!) 21 (!) 23 (!) 22   Temp:    97.9 F (36.6 C)  TempSrc:    Oral  SpO2: 97% 97% 98%   Weight:      Height:         WBC     Component Value Date/Time   WBC 12.2 (H) 07/19/2021 1557   LYMPHSABS 0.4 (L) 07/19/2021 1557   LYMPHSABS 0.9 06/19/2008 1117   MONOABS 0.2 07/19/2021 1557   MONOABS 0.3 06/19/2008 1117   EOSABS 0.0 07/19/2021 1557   EOSABS 0.2 06/19/2008 1117   BASOSABS 0.1 07/19/2021  1557   BASOSABS 0.0 06/19/2008 1117     Lactic Acid, Venous    Component Value Date/Time   LATICACIDVEN 5.0 (HH) 07/19/2021 1557    Procalcitonin 20  Lactic Acid, Venous    Component Value Date/Time   LATICACIDVEN 3.6 (HH) 07/19/2021 1907        UA   evidence of UTI      Urine analysis:    Component Value Date/Time   COLORURINE STRAW (A) 07/19/2021 1704   APPEARANCEUR TURBID (A) 07/19/2021 1704   LABSPEC 1.012 07/19/2021 1704   LABSPEC 1.030 04/11/2007 1446   PHURINE 8.0 07/19/2021 1704   GLUCOSEU NEGATIVE 07/19/2021 1704   HGBUR SMALL (A) 07/19/2021 1704   BILIRUBINUR NEGATIVE 07/19/2021 1704   BILIRUBINUR Negative 04/11/2007 1446   KETONESUR NEGATIVE 07/19/2021 1704   PROTEINUR >=300 (A) 07/19/2021 1704   UROBILINOGEN 0.2 06/10/2014 1303   NITRITE NEGATIVE 07/19/2021 1704   LEUKOCYTESUR MODERATE (A) 07/19/2021 1704   LEUKOCYTESUR Negative 04/11/2007 1446    Results for orders placed or performed during the hospital encounter of 07/19/21  Resp Panel by RT-PCR (Flu A&B, Covid) Urine, Catheterized     Status: None   Collection Time: 07/19/21  5:04 PM   Specimen: Urine, Catheterized; Nasal Swab  Result Value Ref Range Status   SARS Coronavirus 2 by RT PCR NEGATIVE NEGATIVE Final         Influenza A by PCR NEGATIVE NEGATIVE Final   Influenza B by PCR NEGATIVE NEGATIVE Final          _______________________________________________ Hospitalist was called for admission for   Septic shock (  [place])  AKI (acute kidney injury) ([place])    The following Work up has been ordered so far:  Orders Placed This Encounter  Procedures  . Critical Care  . Culture, blood (Routine x 2)  . Resp Panel by RT-PCR (Flu A&B, Covid) Anterior Nasal Swab  . Urine Culture  . DG Chest 2 View  . Comprehensive metabolic panel  . Lactic acid, plasma  . CBC with Differential  . Protime-INR  . Urinalysis, Routine w reflex microscopic  . Diet NPO time specified  . Notify physician (specify)   Specify: Notify provider for possible Code Sepsis  . Document height and weight  . Foley catheter - discontinue  . Insert foley catheter  . Cardiac monitoring  . Assess and Document Glasgow Coma Scale  . Document vital signs within 1-hour of fluid bolus completion. Notify provider of abnormal vital signs despite fluid resuscitation.  . DO NOT delay antibiotics if unable to obtain blood culture.  . Refer to Sidebar Report: Sepsis Sidebar ED/IP  . Insert peripheral IV x 2  . Initiate Carrier Fluid Protocol  . Code Sepsis activation.  This occurs automatically when order is signed and prioritizes pharmacy, lab, and radiology services for STAT collections and interventions.  If [facility] downtime, call [facility] [phone]) to activate Code Sepsis.  . Consult to hospitalist  . Pulse oximetry, continuous  . ED EKG 12-Lead     OTHER Significant initial  Findings:  labs showing:    Recent Labs  Lab 07/19/21 1557 07/19/21 1921  NA 141 143  K 5.6* 5.0  CO2 14* 13*  GLUCOSE 76 63*  BUN 118* 112*  CREATININE 2.87* 2.43*  CALCIUM 10.8* 9.7  MG  --  1.9  PHOS  --  3.6    Cr   Up from baseline see below Lab Results  Component Value Date   CREATININE 2.87 (H) 07/19/2021   CREATININE 1.37 (H) 06/09/2021   CREATININE 1.01 05/24/2021    Recent Labs  Lab 07/19/21 1557  AST 40  ALT 21  ALKPHOS 64  BILITOT 1.5*  PROT 8.0  ALBUMIN 3.2*   Lab Results  Component Value Date   CALCIUM 10.8 (H) 07/19/2021   PHOS 3.5 05/21/2021       Plt: Lab Results  Component Value Date   PLT 301 07/19/2021      pH, Ven 7.32 Acid-base deficit 10.2 High  mmol/L  pCO2, Ven 28 Low  mmHg O2 Saturation 30.1 %  pO2, Ven <31 Low Panic  mmHg          Recent Labs  Lab 07/19/21 1557  WBC 12.2*  NEUTROABS 11.5*  HGB 13.9  HCT 45.0  MCV 101.4*  PLT 301    HG/HCT  stable,      Component Value Date/Time   HGB 13.9 07/19/2021 1557   HGB 11.6 (L) 06/19/2008 1117   HCT 45.0 07/19/2021 1557    HCT 32.2 (L) 06/19/2008 1117   MCV 101.4 (H) 07/19/2021 1557   MCV 121.6 (H) 06/19/2008 1117      Cardiac Panel (last 3 results) Recent Labs    07/19/21 1921  CKTOTAL 401*     DM  labs:  HbA1C: No results for input(s): "HGBA1C" in the last 8760 hours.     CBG (last 3)  Recent Labs    07/19/21 2327  GLUCAP 47*      Cultures:    Component Value Date/Time   SDES  05/20/2021 2038    IN/OUT CATH  URINE Performed at [facility], [place] [address], [place], [place] [address]    SPECREQUEST  05/20/2021 2038    NONE Performed at [facility], [place] [address], [place], [place] [address]    CULT (A) 05/20/2021 2038    40,000 COLONIES/mL PROVIDENCIA STUARTII 40,000 COLONIES/mL CITROBACTER AMALONATICUS    REPTSTATUS 05/23/2021 FINAL 05/20/2021 2038     Radiological Exams on Admission: DG Chest 2 View  Result Date: 07/19/2021 CLINICAL DATA:  Suspected Sepsis Hypotension.  Abnormal labs. EXAM: CHEST - 2 VIEW COMPARISON:  Chest radiograph 03/17/2021, chest CT 06/22/2014 FINDINGS: The heart is normal in size. Stable mediastinal contours. No acute airspace disease. No pulmonary edema, pleural effusion, or pneumothorax. Apical blebs and scarring again seen. IMPRESSION: No acute chest findings. Electronically Signed   By: Keith Rake M.D.   On: 07/19/2021 16:59   _______________________________________________________________________________________________________ Latest  Blood pressure 97/73, pulse (!) 116, temperature 97.9 F (36.6 C), temperature source Oral, resp. rate (!) 22, height '6\' 2"'$  (1.88 m), weight 58.1 kg, SpO2 98 %.   Vitals  labs and radiology finding personally reviewed  Review of Systems:    Pertinent positives include:    Fevers, chills, fatigue, Constitutional:  No weight loss, night sweats, weight loss  HEENT:  No headaches, Difficulty swallowing,Tooth/dental problems,Sore throat,  No sneezing, itching, ear ache,  nasal congestion, post nasal drip,  Cardio-vascular:  No chest pain, Orthopnea, PND, anasarca, dizziness, palpitations.no Bilateral lower extremity swelling  GI:  No heartburn, indigestion, abdominal pain, nausea, vomiting, diarrhea, change in bowel habits, loss of appetite, melena, blood in stool, hematemesis Resp:  no shortness of breath at rest. No dyspnea on exertion, No excess mucus, no productive cough, No non-productive cough, No coughing up of blood.No change in color of mucus.No wheezing. Skin:  no rash or lesions. No jaundice GU:  no dysuria, change in color of urine, no urgency or frequency. No straining to urinate.  No flank pain.  Musculoskeletal:  No joint pain or no joint swelling. No decreased range of motion. No back pain.  Psych:  No change in mood or affect. No depression or anxiety. No memory loss.  Neuro: no localizing neurological complaints, no tingling, no weakness, no double vision, no gait abnormality, no slurred speech, no confusion  All systems reviewed and apart from [place] all are negative _______________________________________________________________________________________________ Past Medical History:   Past Medical History:  Diagnosis Date  . Arthritis   . Decubitus ulcer of sacral area   . DVT (deep venous thrombosis) ([place]) 2008   Left leg  . Exposure to hepatitis B   . H/O hypotension   . History of anemia   . History of gout   . History of transfusion   . 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 HIV (human immunodeficiency virus infection) ([place])   . Hx of sepsis 2015   affected rt hip / femur  . Pain in limb 07/23/2013  . PONV (postoperative nausea and vomiting)    Hiccups for 4 days after anesthesia  . Radiation   . 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 Shingles    left shoulder, right leg      Past Surgical History:  Procedure Laterality Date  . COLONOSCOPY    . CONVERSION TO TOTAL HIP Right 06/15/2014   Procedure:  REMOVAL OF ANTIBIOTIC  SPACER AND CONVERSION TO RIGHT TOTAL HIP  ARTHROPLASTY ;  Surgeon: Paralee Cancel, MD;  Location:  [facility];  Service: Orthopedics;  Laterality: Right;  . CYSTOSCOPY    . FLEXIBLE SIGMOIDOSCOPY N/A 12/17/2020   Procedure: FLEXIBLE SIGMOIDOSCOPY;  Surgeon: Gatha Mayer, MD;  Location: [facility];  Service: Endoscopy;  Laterality: N/A;  . HEMATOMA EVACUATION    . HERNIA REPAIR     right X2  . INCISION AND DRAINAGE HIP Right 07/31/2013   Procedure: IRRIGATION AND DEBRIDEMENT HIP WITH PERCUTANEOUS DRAIN PLACEMENT;  Surgeon: [name], MD;  Location: [place];  Service: Orthopedics;  Laterality: Right;  . INCISION AND DRAINAGE HIP Right 03/15/2015   Procedure: IRRIGATION AND DEBRIDEMENT RIGHT HIP WITH  HEAD BALL  AND POLY LINER EXCHANGE;  Surgeon: Paralee Cancel, MD;  Location: [facility];  Service: Orthopedics;  Laterality: Right;  . INCISION AND DRAINAGE OF WOUND N/A 08/25/2013   Procedure: IRRIGATION AND DEBRIDEMENT OF SACRAL ULCER WITH PLACEMENT OF A CELL ;  Surgeon: Theodoro Kos, DO;  Location: [place];  Service: Plastics;  Laterality: N/A;  . INCISION AND DRAINAGE OF WOUND N/A 09/01/2013   Procedure: IRRIGATION AND DEBRIDEMENT SACRAL ULCER WITH PLACEMENT OF A CELL;  Surgeon: Theodoro Kos, DO;  Location: [place];  Service: Plastics;  Laterality: N/A;  . INCISION AND DRAINAGE OF WOUND N/A 09/08/2013   Procedure: IRRIGATION AND DEBRIDEMENT OF SACRAL ULCER WITH PLACEMENT OF A CELL AND VAC;  Surgeon: Theodoro Kos, DO;  Location: [place];  Service: Plastics;  Laterality: N/A;  . INCISION AND DRAINAGE OF WOUND N/A 09/18/2013   Procedure: IRRIGATION AND DEBRIDEMENT OF SACRAL WOUND WITH PLACEMENT OF A-CELL;  Surgeon: Theodoro Kos, DO;  Location: [place];  Service: Plastics;  Laterality: N/A;  . PILONIDAL CYST DRAINAGE N/A 08/09/2013   Procedure: IRRIGATION AND DEBRIDEMENT SACRAL DECUBITUS;  Surgeon: Harl Bowie, MD;  Location: [place];  Service: General;  Laterality: N/A;  . RECTAL BIOPSY N/A  03/29/2020   Procedure: EXCISION OF PERIANAL TISSUE WITH BX AND 3 PUNCH BXS;  Surgeon: Ileana Roup, MD;  [facility];  Service: General;  Laterality: N/A;  . TEE WITHOUT CARDIOVERSION N/A 03/22/2021   Procedure: TRANSESOPHAGEAL ECHOCARDIOGRAM (TEE);  Surgeon: Buford Dresser, MD;  Location: [facility];  Service: Cardiovascular;  Laterality: N/A;  . TOTAL HIP ARTHROPLASTY Right 08/04/2013   Procedure: RESECTION HIP JOINT - PLACEMENT OF CEMENT PROSTHESIS;  Surgeon: [name], MD;  Location: [place];  Service: Orthopedics;  Laterality: Right;  . TRANSURETHRAL RESECTION OF PROSTATE  JAN 2016    Social History:  Ambulatory  bed bound     reports that he has quit smoking. His smoking use included cigarettes. He smoked an average of .3 packs per day. He has never used smokeless tobacco. He reports that he does not currently use alcohol after a past usage of about 3.0 standard drinks of alcohol per week. He reports that he does not currently use drugs after having used the following drugs: Marijuana.     Family History:   Family History  Problem Relation Age of Onset  . Diabetes Mother   . Dementia Mother   . Atrial fibrillation Mother   . Hypertension Mother   . Cancer Father   . Emphysema Father    ______________________________________________________________________________________________ Allergies: Allergies  Allergen Reactions  . Collagen Rash    redness     Prior to Admission medications   Medication Sig Start Date End Date Taking? Authorizing Provider  acetaminophen (TYLENOL) 325 MG tablet Take 2 tablets (650 mg total) by mouth every 6 (six) hours  as needed for mild pain (or Fever >/= 101). 12/23/20   Raiford Noble Latif, DO  cyanocobalamin 100 MCG tablet Take 100 mcg by mouth daily.    [provider]  cyclobenzaprine (FLEXERIL) 10 MG tablet Take 10 mg by mouth at bedtime. 02/09/21   [provider]  darunavir-cobicistat (PREZCOBIX)  800-150 MG tablet TAKE 1 TABLET BY MOUTH DAILY . SWALLOW WHOLE DO NOT CRUSH, BREAK OR CHEW TABLETS WITH FOOD. 06/09/21   [name], MD  dolutegravir (TIVICAY) 50 MG tablet Take 1 tablet (50 mg total) by mouth daily. 05/13/21   [name], MD  famotidine (PEPCID) 20 MG tablet Take 1 tablet (20 mg total) by mouth daily. 12/23/20   Raiford Noble Latif, DO  feeding supplement (ENSURE ENLIVE / ENSURE PLUS) LIQD Take 237 mLs by mouth 3 (three) times daily between meals. 05/24/21   Hongalgi, Lenis Dickinson, MD  megestrol (MEGACE ES) 625 MG/5ML suspension Take 5 mLs (625 mg total) by mouth daily. 06/09/21   [name], MD  Multiple Vitamin (MULTIVITAMIN WITH MINERALS) TABS tablet Take 1 tablet by mouth daily. 05/24/21   Hongalgi, Lenis Dickinson, MD  oxycodone (OXY-IR) 5 MG capsule Take 1 capsule (5 mg total) by mouth See admin instructions. Give '5mg'$  by mouth daily for wound care. May also give '5mg'$  every 6 hours as needed for chronic pain Patient taking differently: Take 5 mg by mouth every 6 (six) hours as needed for pain. 03/23/21   [place] Donnamarie Poag, DO  polycarbophil (FIBERCON) 625 MG tablet Take 625 mg by mouth daily.    [provider]  rilpivirine (EDURANT) 25 MG TABS tablet TAKE 1 TABLET BY MOUTH DAILY WITH BREAKFAST 06/09/21   [name], MD  senna-docusate (SENOKOT-S) 8.6-50 MG tablet Take 1 tablet by mouth 2 (two) times daily. 05/24/21   Hongalgi, Lenis Dickinson, MD  simethicone (MYLICON) 80 MG chewable tablet Chew 80 mg by mouth with breakfast, with lunch, and with evening meal.    [provider]  VFEND 200 MG tablet Take 1 tablet (200 mg total) by mouth 2 (two) times daily. 05/12/21   [name], MD  vitamin C (ASCORBIC ACID) 500 MG tablet Take 1,000 mg by mouth daily.    [provider]  zinc sulfate 220 (50 Zn) MG capsule Take 1 capsule (220 mg total) by mouth daily. 12/24/20   Kerney Elbe, DO     ___________________________________________________________________________________________________ Physical Exam:    07/19/2021    7:00 PM 07/19/2021    6:52 PM 07/19/2021    6:15 PM  Vitals with BMI  Systolic 97 062 94  Diastolic 73 92 72  Pulse 694 121 113     1. General:  in No  Acute distress   Chronically ill -appearing 2. Psychological: Alert and   Oriented 3. Head/ENT:    Dry Mucous Membranes                          Head Non traumatic, neck supple                           Poor Dentition 4. SKIN:  decreased Skin turgor,  Skin clean Dry and multiple ulcers     5. Heart: Regular rate and rhythm no  Murmur, no Rub or gallop 6. Lungs: no wheezes or crackles   7. Abdomen: Soft,  non-tender, Non distended bowel sounds present  8. Lower extremities: no clubbing, cyanosis, no  edema 9. Neurologically Grossly intact, moving all 4 extremities equally   10. MSK: Normal range of motion    Chart has been reviewed  ______________________________________________________________________________________________  Assessment/Plan 61 y.o. 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  Functional quadriplegia  Admitted for   Septic shock (HCC) ***  Uremia ***  AKI (acute kidney injury) ([place]) ***    Present on Admission: **None**     No problem-specific Assessment & Plan notes found for this encounter.    Other plan as per orders.  DVT prophylaxis:  SCD      Code Status:   DNR/DNI  as per patient  family  I had personally discussed CODE STATUS with patient and family    Family Communication:   Family   at  Bedside  plan of care was discussed   with  Sister,    Disposition Plan:                              Back to current facility when stable                            Following barriers for discharge:                             Electrolytes corrected                                                            Afebrile, white count improving able to transition to PO antibiotics                            Will need consultants to evaluate patient prior to discharge  ****EXPECT DC tomorrow                     Would benefit from PT/OT eval prior to DC  Ordered                   Swallow eval - SLP ordered                                       Transition of care consulted                   Nutrition    consulted                   Consults called: PCCM aware,did let ID know pt has been admitted  Admission status:  ED Disposition     ED Disposition  [place]: [place] [[id number]]  Level of Care: Stepdown [14]  Admit to SDU based on following criteria: Hemodynamic compromise or significant risk of instability:  Patient requiring short term acute titration and management of vasoactive drips, and invasive monitoring (i.e., CVP and  Arterial line).  May admit patient to [facility] or [facility] if equivalent level of care is available:: No  Covid Evaluation: Asymptomatic - no recent exposure (last 10 days) testing not required  Diagnosis: Severe sepsis [facility]) [[id number]]  Admitting Physician: Toy Baker [[id number]]  Attending Physician: Toy Baker [[id number]]  Certification:: I certify this patient will need inpatient services for at least 2 midnights  Estimated Length of Stay: 2            inpatient     I Expect 2 midnight stay secondary to severity of patient's current illness need for inpatient interventions justified by the following:  hemodynamic instability despite optimal treatment (tachycardia  )  Severe lab/radiological/exam abnormalities including:    Severe sepsis and extensive comorbidities including:  history of stroke with residual deficits    That are currently affecting medical management.   I expect  patient  to be hospitalized for 2 midnights requiring inpatient medical care.  Patient is at high risk for adverse outcome (such as loss of life or disability) if not treated.  Indication for inpatient stay as follows:  Severe change from baseline regarding mental status     New or worsening hypoxia   Need for IV antibiotics, IV fluids,      Level of care  stepdown please discontinue once patient no longer qualifies COVID-19 Labs    Lab Results  Component Value Date   [place] NEGATIVE 07/19/2021     Precautions: admitted as   Covid Negative       Ceniyah Thorp 07/19/2021, 11:48 PM    [facility]     after 2 AM please page floor coverage PA If 7AM-7PM, please contact the day team taking care of the patient using [email or web address]   Patient was evaluated in the context of the global COVID-19 pandemic, which necessitated consideration that the patient might be at risk for infection with the SARS-CoV-2 virus that causes COVID-19. Institutional protocols and algorithms that pertain to the evaluation of patients at risk for COVID-19 are in a state of rapid change based on information released by regulatory bodies including the CDC and federal and state organizations. These policies and algorithms were followed during the patient's care.

## 2021-07-19 NOTE — ED Notes (Signed)
Lab is adding on to the urine

## 2021-07-19 NOTE — Assessment & Plan Note (Signed)
Check HIV viral load and CD4 count continue home medications Including Prezcobix 800/150 mg a day dolutegravir (TIVICAY) 50 MG tablet     50 mg, Oral, Daily   rilpivirine (EDURANT) 25 MG Tab tablet     TAKE 1 TABLET BY MOUTH DAILY WITH BREAKFAST

## 2021-07-19 NOTE — Sepsis Progress Note (Signed)
[facility] is monitoring code sepsis

## 2021-07-19 NOTE — Progress Notes (Signed)
Patient seen and examined at bedside.  Wife at bedside.  Receiving his final liter of his fluid bolus.  Remains very dry on clinical exam.  MAP currently in the upper 70s.  BP 101/69   Pulse (!) 111   Temp 97.9 F (36.6 C) (Oral)   Resp (!) 28   Ht '6\' 2"'$  (1.88 m)   Wt 58.1 kg   SpO2 96%   BMI 16.43 kg/m    CT abdomen pelvis ordered to further evaluate abdominal pain-oral contrast only due to AKI. Additional 2 L LR bolus ordered. Stable to admit to stepdown under [facility] service. Full consult note to follow.   Julian Hy, DO 07/19/21 8:11 PM [facility]

## 2021-07-19 NOTE — Assessment & Plan Note (Signed)
Order wound care consult

## 2021-07-19 NOTE — Progress Notes (Signed)
Pharmacy Antibiotic Note  [name] [name] is a 61 y.o. 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  Pharmacy has been consulted for vancomycin and cefepime dosing. AKI noted on admission (baseline SCr 1.0)  Plan: Vancomycin 1000 mg IV q48 hr (est AUC 515 based on SCr 2.87; Vd 0.72) Measure vancomycin AUC at steady state as indicated SCr daily while on vanc Cefepime 2g IV q24 hr   Height: '6\' 2"'$  (188 cm) Weight: 58.1 kg (128 lb) IBW/kg (Calculated) : 82.2  Temp (24hrs), Avg:99.3 F (37.4 C), Min:97.9 F (36.6 C), Max:100.7 F (38.2 C)  Recent Labs  Lab 07/19/21 1557 07/19/21 1907  WBC 12.2*  --   CREATININE 2.87*  --   LATICACIDVEN 5.0* 3.6*    Estimated Creatinine Clearance: 22.8 mL/min (A) (by C-G formula based on SCr of 2.87 mg/dL (H)).    Allergies  Allergen Reactions   Collagen Rash    redness    Antimicrobials this admission: 7/11 vancomycin >>  7/11 cefepime >>   Dose adjustments this admission: N/a  Microbiology results: 7/11 BCx: sent 7/11 UCx: sent    Thank you for allowing pharmacy to be a part of this patient's care.  Mavin Dyke A 07/19/2021 8:57 PM

## 2021-07-19 NOTE — Assessment & Plan Note (Signed)
Appears to be slightly on the dry side monitor fluid status

## 2021-07-20 ENCOUNTER — Inpatient Hospital Stay (HOSPITAL_COMMUNITY): Payer: Medicare Other

## 2021-07-20 ENCOUNTER — Encounter (HOSPITAL_COMMUNITY): Payer: Self-pay | Admitting: Internal Medicine

## 2021-07-20 DIAGNOSIS — A419 Sepsis, unspecified organism: Secondary | ICD-10-CM | POA: Diagnosis not present

## 2021-07-20 DIAGNOSIS — E162 Hypoglycemia, unspecified: Secondary | ICD-10-CM | POA: Diagnosis present

## 2021-07-20 DIAGNOSIS — R6520 Severe sepsis without septic shock: Secondary | ICD-10-CM | POA: Diagnosis not present

## 2021-07-20 DIAGNOSIS — R935 Abnormal findings on diagnostic imaging of other abdominal regions, including retroperitoneum: Secondary | ICD-10-CM | POA: Diagnosis present

## 2021-07-20 LAB — BLOOD CULTURE ID PANEL (REFLEXED) - BCID2

## 2021-07-20 LAB — BASIC METABOLIC PANEL
Anion gap: 8 (ref 5–15)
Anion gap: 8 (ref 5–15)
Anion gap: 8 (ref 5–15)
BUN: 84 mg/dL — ABNORMAL HIGH (ref 6–20)
BUN: 75 mg/dL — ABNORMAL HIGH (ref 6–20)
BUN: 67 mg/dL — ABNORMAL HIGH (ref 6–20)
CO2: 16 mmol/L — ABNORMAL LOW (ref 22–32)
CO2: 13 mmol/L — ABNORMAL LOW (ref 22–32)
CO2: 19 mmol/L — ABNORMAL LOW (ref 22–32)
Calcium: 9.4 mg/dL (ref 8.9–10.3)
Calcium: 9.5 mg/dL (ref 8.9–10.3)
Calcium: 9.9 mg/dL (ref 8.9–10.3)
Chloride: 120 mmol/L — ABNORMAL HIGH (ref 98–111)
Chloride: 121 mmol/L — ABNORMAL HIGH (ref 98–111)
Chloride: 118 mmol/L — ABNORMAL HIGH (ref 98–111)
Creatinine, Ser: 1.8 mg/dL — ABNORMAL HIGH (ref 0.61–1.24)
Creatinine, Ser: 1.66 mg/dL — ABNORMAL HIGH (ref 0.61–1.24)
Creatinine, Ser: 1.67 mg/dL — ABNORMAL HIGH (ref 0.61–1.24)
GFR, Estimated: 43 mL/min — ABNORMAL LOW (ref >60)
GFR, Estimated: 47 mL/min — ABNORMAL LOW (ref >60)
GFR, Estimated: 47 mL/min — ABNORMAL LOW (ref >60)
Glucose, Bld: 112 mg/dL — ABNORMAL HIGH (ref 70–99)
Glucose, Bld: 145 mg/dL — ABNORMAL HIGH (ref 70–99)
Glucose, Bld: 174 mg/dL — ABNORMAL HIGH (ref 70–99)
Potassium: 4 mmol/L (ref 3.5–5.1)
Potassium: 3.5 mmol/L (ref 3.5–5.1)
Potassium: 4.1 mmol/L (ref 3.5–5.1)
Sodium: 144 mmol/L (ref 135–145)
Sodium: 142 mmol/L (ref 135–145)
Sodium: 145 mmol/L (ref 135–145)

## 2021-07-20 LAB — CBC
HCT: 30.4 % — ABNORMAL LOW (ref 39.0–52.0)
Hemoglobin: 9.3 g/dL — ABNORMAL LOW (ref 13.0–17.0)
MCH: 31.1 pg (ref 26.0–34.0)
MCHC: 30.6 g/dL (ref 30.0–36.0)
MCV: 101.7 fL — ABNORMAL HIGH (ref 80.0–100.0)
Platelets: 181 10*3/uL (ref 150–400)
RBC: 2.99 MIL/uL — ABNORMAL LOW (ref 4.22–5.81)
RDW: 20.6 % — ABNORMAL HIGH (ref 11.5–15.5)
WBC: 8.1 10*3/uL (ref 4.0–10.5)
nRBC: 0 % (ref 0.0–0.2)

## 2021-07-20 LAB — TROPONIN I (HIGH SENSITIVITY): Troponin I (High Sensitivity): 448 ng/L (ref <18)

## 2021-07-20 LAB — COMPREHENSIVE METABOLIC PANEL
ALT: 14 U/L (ref 0–44)
ALT: 16 U/L (ref 0–44)
AST: 29 U/L (ref 15–41)
AST: 29 U/L (ref 15–41)
Albumin: 2.2 g/dL — ABNORMAL LOW (ref 3.5–5.0)
Albumin: 2.3 g/dL — ABNORMAL LOW (ref 3.5–5.0)
Alkaline Phosphatase: 42 U/L (ref 38–126)
Alkaline Phosphatase: 44 U/L (ref 38–126)
Anion gap: 10 (ref 5–15)
Anion gap: 8 (ref 5–15)
BUN: 91 mg/dL — ABNORMAL HIGH (ref 6–20)
BUN: 93 mg/dL — ABNORMAL HIGH (ref 6–20)
CO2: 15 mmol/L — ABNORMAL LOW (ref 22–32)
CO2: 15 mmol/L — ABNORMAL LOW (ref 22–32)
Calcium: 9.2 mg/dL (ref 8.9–10.3)
Calcium: 9.4 mg/dL (ref 8.9–10.3)
Chloride: 120 mmol/L — ABNORMAL HIGH (ref 98–111)
Chloride: 121 mmol/L — ABNORMAL HIGH (ref 98–111)
Creatinine, Ser: 1.82 mg/dL — ABNORMAL HIGH (ref 0.61–1.24)
Creatinine, Ser: 1.97 mg/dL — ABNORMAL HIGH (ref 0.61–1.24)
GFR, Estimated: 38 mL/min — ABNORMAL LOW (ref >60)
GFR, Estimated: 42 mL/min — ABNORMAL LOW (ref >60)
Glucose, Bld: 71 mg/dL (ref 70–99)
Glucose, Bld: 72 mg/dL (ref 70–99)
Potassium: 4.3 mmol/L (ref 3.5–5.1)
Potassium: 4.3 mmol/L (ref 3.5–5.1)
Sodium: 144 mmol/L (ref 135–145)
Sodium: 145 mmol/L (ref 135–145)
Total Bilirubin: 1.1 mg/dL (ref 0.3–1.2)
Total Bilirubin: 1.2 mg/dL (ref 0.3–1.2)
Total Protein: 5.2 g/dL — ABNORMAL LOW (ref 6.5–8.1)
Total Protein: 5.4 g/dL — ABNORMAL LOW (ref 6.5–8.1)

## 2021-07-20 LAB — GLUCOSE, CAPILLARY
Glucose-Capillary: 103 mg/dL — ABNORMAL HIGH (ref 70–99)
Glucose-Capillary: 122 mg/dL — ABNORMAL HIGH (ref 70–99)
Glucose-Capillary: 122 mg/dL — ABNORMAL HIGH (ref 70–99)
Glucose-Capillary: 139 mg/dL — ABNORMAL HIGH (ref 70–99)
Glucose-Capillary: 146 mg/dL — ABNORMAL HIGH (ref 70–99)
Glucose-Capillary: 177 mg/dL — ABNORMAL HIGH (ref 70–99)
Glucose-Capillary: 187 mg/dL — ABNORMAL HIGH (ref 70–99)
Glucose-Capillary: 198 mg/dL — ABNORMAL HIGH (ref 70–99)
Glucose-Capillary: 68 mg/dL — ABNORMAL LOW (ref 70–99)
Glucose-Capillary: 80 mg/dL (ref 70–99)
Glucose-Capillary: 98 mg/dL (ref 70–99)

## 2021-07-20 LAB — LACTIC ACID, PLASMA
Lactic Acid, Venous: 4.9 mmol/L (ref 0.5–1.9)
Lactic Acid, Venous: 2.4 mmol/L (ref 0.5–1.9)

## 2021-07-20 LAB — PREALBUMIN: Prealbumin: 17 mg/dL — ABNORMAL LOW (ref 18–38)

## 2021-07-20 LAB — MAGNESIUM: Magnesium: 1.7 mg/dL (ref 1.7–2.4)

## 2021-07-20 LAB — HEPATIC FUNCTION PANEL
ALT: 16 U/L (ref 0–44)
AST: 29 U/L (ref 15–41)
Albumin: 2.2 g/dL — ABNORMAL LOW (ref 3.5–5.0)
Alkaline Phosphatase: 42 U/L (ref 38–126)
Bilirubin, Direct: 0.6 mg/dL — ABNORMAL HIGH (ref 0.0–0.2)
Indirect Bilirubin: 0.4 mg/dL (ref 0.3–0.9)
Total Bilirubin: 1 mg/dL (ref 0.3–1.2)
Total Protein: 5.3 g/dL — ABNORMAL LOW (ref 6.5–8.1)

## 2021-07-20 LAB — T-HELPER CELLS (CD4) COUNT (NOT AT ARMC)
CD4 % Helper T Cell: 13 % — ABNORMAL LOW (ref 33–65)
CD4 T Cell Abs: 80 /uL — ABNORMAL LOW (ref 400–1790)

## 2021-07-20 LAB — CREATININE, URINE, RANDOM: Creatinine, Urine: 24 mg/dL

## 2021-07-20 LAB — SODIUM, URINE, RANDOM: Sodium, Ur: 103 mmol/L

## 2021-07-20 LAB — OSMOLALITY, URINE: Osmolality, Ur: 397 mOsm/kg (ref 300–900)

## 2021-07-20 LAB — MRSA NEXT GEN BY PCR, NASAL: MRSA by PCR Next Gen: DETECTED — AB

## 2021-07-20 MED ORDER — ASCORBIC ACID 500 MG PO TABS
500.0000 mg | ORAL_TABLET | Freq: Two times a day (BID) | ORAL | Status: DC
  Administered 2021-07-20 – 2021-07-22 (×4): 500 mg via ORAL
  Filled 2021-07-20 (×4): qty 1

## 2021-07-20 MED ORDER — DEXTROSE 50 % IV SOLN
12.5000 g | INTRAVENOUS | Status: AC
Start: 2021-07-20 — End: 2021-07-20
  Administered 2021-07-20: 12.5 g via INTRAVENOUS
  Filled 2021-07-20: qty 50

## 2021-07-20 MED ORDER — ZINC SULFATE 220 (50 ZN) MG PO CAPS
220.0000 mg | ORAL_CAPSULE | Freq: Every day | ORAL | Status: DC
  Administered 2021-07-20 – 2021-07-22 (×3): 220 mg via ORAL
  Filled 2021-07-20 (×3): qty 1

## 2021-07-20 MED ORDER — DEXTROSE 10 % IV SOLN: INTRAVENOUS | Status: DC

## 2021-07-20 MED ORDER — NOREPINEPHRINE 4 MG/250ML-% IV SOLN
2.0000 ug/min | INTRAVENOUS | Status: DC
  Administered 2021-07-20: 7 ug/min via INTRAVENOUS
  Administered 2021-07-20: 5 ug/min via INTRAVENOUS
  Administered 2021-07-20: 2 ug/min via INTRAVENOUS
  Administered 2021-07-20: 7 ug/min via INTRAVENOUS
  Filled 2021-07-20 (×3): qty 250

## 2021-07-20 MED ORDER — SODIUM CHLORIDE 0.9 % IV SOLN
2.0000 g | Freq: Two times a day (BID) | INTRAVENOUS | Status: DC
  Filled 2021-07-20: qty 12.5

## 2021-07-20 MED ORDER — SODIUM CHLORIDE 0.9 % IV SOLN
250.0000 mL | INTRAVENOUS | Status: DC
  Administered 2021-07-20: 250 mL via INTRAVENOUS

## 2021-07-20 MED ORDER — SODIUM CHLORIDE 0.9 % IV BOLUS
1000.0000 mL | Freq: Once | INTRAVENOUS | Status: AC
  Administered 2021-07-20: 1000 mL via INTRAVENOUS

## 2021-07-20 MED ORDER — CEFTRIAXONE SODIUM 2 G IJ SOLR
2.0000 g | Freq: Every day | INTRAMUSCULAR | Status: DC
  Administered 2021-07-20 – 2021-07-22 (×3): 2 g via INTRAVENOUS
  Filled 2021-07-20 (×3): qty 20

## 2021-07-20 MED ORDER — THIAMINE HCL 100 MG/ML IJ SOLN
100.0000 mg | Freq: Every day | INTRAMUSCULAR | Status: DC
  Administered 2021-07-20 – 2021-07-22 (×3): 100 mg via INTRAVENOUS
  Filled 2021-07-20 (×3): qty 2

## 2021-07-20 MED ORDER — ZINC SULFATE 220 (50 ZN) MG PO CAPS: 220.0000 mg | ORAL_CAPSULE | Freq: Every day | ORAL | Status: DC

## 2021-07-20 MED ORDER — DARUNAVIR-COBICISTAT 800-150 MG PO TABS
1.0000 | ORAL_TABLET | Freq: Every day | ORAL | Status: DC
Start: 2021-07-20 — End: 2021-07-22
  Administered 2021-07-21 – 2021-07-22 (×2): 1 via ORAL
  Filled 2021-07-20 (×3): qty 1

## 2021-07-20 MED ORDER — MEDIHONEY WOUND/BURN DRESSING EX PSTE
1.0000 | PASTE | Freq: Every day | CUTANEOUS | Status: DC
Start: 2021-07-20 — End: 2021-07-22
  Administered 2021-07-21 – 2021-07-22 (×2): 1 via TOPICAL
  Filled 2021-07-20: qty 44

## 2021-07-20 MED ORDER — HYDROMORPHONE HCL 1 MG/ML IJ SOLN
0.5000 mg | INTRAMUSCULAR | Status: DC | PRN
  Administered 2021-07-20 – 2021-07-22 (×6): 0.5 mg via INTRAVENOUS
  Filled 2021-07-20 (×6): qty 1

## 2021-07-20 MED ORDER — MUPIROCIN 2 % EX OINT
1.0000 | TOPICAL_OINTMENT | Freq: Two times a day (BID) | CUTANEOUS | Status: DC
  Administered 2021-07-20 – 2021-07-22 (×6): 1 via NASAL
  Filled 2021-07-20: qty 22

## 2021-07-20 MED ORDER — DEXTROSE IN LACTATED RINGERS 5 % IV SOLN: INTRAVENOUS | Status: DC

## 2021-07-20 MED ORDER — JUVEN PO PACK
1.0000 | PACK | Freq: Two times a day (BID) | ORAL | Status: DC
Start: 2021-07-20 — End: 2021-07-22
  Administered 2021-07-20 – 2021-07-21 (×2): 1 via ORAL
  Filled 2021-07-20 (×3): qty 1

## 2021-07-20 NOTE — Consult Note (Signed)
[place] Nurse Consult Note: Reason for Consult: Patient well known to [facility] nursing team from previous admission.  Bedbound patient with chronic wounds  Wound type: Stage 4 pressure injury; sacrum Unstageable pressure injury: left ischium Full thickness necrotic wounds of the left plantar foot x 2; etiology pressure vs neuropathic based on location. However patient has severe foot drop could be related to pressure from bed or other device  Partial thickness wound right lateral foot; Stage 2 possible related to pressure since it is on the lateral foot Pressure Injury POA: Yes Measurement: see nursing flow sheets  Wound bed: Sacrum: 90% clean, ruddy, 10% fibrinous slough Left foot wounds; 100% necrotic  Right foot wound 100% pink Left ischium: 100% slough Drainage (amount, consistency, odor) see nursing flow sheets  Periwound: intact; chronic non blanchable skin over the buttocks and sacral region  Dressing procedure/placement/frequency: Moist saline packing for the sacrum daily, cover with dry dressing Medihoney to the left ischium to clear the necrotic tissue daily  Prevalon boots bilaterally to offload heels and other portions of the feet. Low air loss mattress (LALM) for moisture management and pressure redistribution Conservative treatment for the left foot wounds at this time, xeroform until further work up can be obtained if MD agrees, would be concerned for osteo in this area.  Nutritional supplementation per RD/CCM  Discussed POC with bedside nurse.  Re consult if needed, will not follow at this time. Thanks  Yi Falletta [employer], RN,CWOCN, CNS, [place] [phone])

## 2021-07-20 NOTE — Assessment & Plan Note (Signed)
Check prealbumin order nutritional consult 

## 2021-07-20 NOTE — Consult Note (Signed)
I have been asked to see the patient by Dr. Noemi Chapel, for evaluation and management of bilateral hydronephrosis.  History of present illness: 61 year old male with numerous comorbidities who presented to the emergency department from his nursing home feeling weak and lethargic for 2 days and found to be hypotensive with abnormal labs at his nursing facility.  In the ER he was found to be hypotensive with generalized abdominal pain and a Foley catheter draining Foley catheter.  He has a chronic indwelling Foley with a history of urethral stricture.  His Foley catheter exchanged at the nursing facility, and is unclear when his last Foley catheter was changed.  Work-up with in the ER demonstrated acute renal failure, mild leukocytosis, and a elevated lactate.  CT scan demonstrated bilateral hydroureteronephrosis down to the bladder which was distended despite Foley catheter being well-positioned.  There is no stone or obvious source of obstruction within the ureter.  His Foley catheter was changed in the emergency department.  He was given IV fluid bolus resuscitation subsequently transferred to the ICU for further monitoring.  While in the ICU his blood pressures have worsened and he has since been started on Levophed.  The patient had an abdominal ultrasound this morning demonstrating dilated cystic duct and a distended gallbladder with no gallstones.  She had abnormalities noted on this ultrasound was persistent right-sided hydronephrosis.  The patient this morning is complaining of suprapubic tenderness as well as pain in his right flank and back.  He has rigors and severe hiccups.  Bedside bladder ultrasound/PVR demonstrated no residual urine in his bladder.  Patient has been making quite a bit of urine overnight.    Review of systems: A 12 point comprehensive review of systems was obtained and is negative unless otherwise stated in the history of present illness.  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   Nonrheumatic mitral valve regurgitation    High anion gap metabolic acidosis 70/35/0093   Hypercalcemia 03/17/2021   Bleeding from wound 12/19/2020   Acute urinary retention 12/19/2020   FTT (failure to thrive) in adult 12/19/2020   GIB (gastrointestinal bleeding) 12/16/2020   Palliative care encounter 12/09/2020   Ileus ([place]) 12/09/2020   Encephalopathy    Pressure injury of skin 10/10/2020   Acute encephalopathy 10/10/2020   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   Chronic diastolic CHF (congestive heart failure) ([place]) 10/09/2020   Normochromic normocytic anemia 10/09/2020   Rigidity 10/09/2020   Hypernatremia 10/09/2020   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   Onychomycosis 08/19/2020   Penile ulcer 08/19/2020   AKI (acute kidney injury) ([place]) 05/13/2020   Lactic acidosis 05/13/2020   SDH (subdural hematoma) ([place]) 05/13/2020   Hypothermia due to exposure 05/13/2020   Cellulitis 05/13/2020   Elevated brain natriuretic peptide (BNP) level 05/13/2020   Volume overload 05/13/2020   Dehydration 05/13/2020   Decubitus ulcer 05/13/2020   Subdural empyema    Streptococcal bacteremia    Rectal bleeding 02/20/2020   Abnormal digital rectal exam 02/20/2020   History of anal cancer 02/20/2020   Poor dentition 01/16/2017   Acute renal failure superimposed on stage 3a chronic kidney disease ([place]) 06/10/2014   Septic arthritis of hip ([place]) 07/31/2013   Sacral decubitus ulcer, stage IV ([place]) 07/31/2013   Protein-calorie malnutrition, severe ([place]) 07/31/2013  Inguinal adenopathy 02/18/2013   Enlargement of lymph nodes 01/13/2013   Urethral stricture 09/05/2007   ANEMIA, CHRONIC 03/20/2007   Unspecified severe protein-calorie malnutrition ([place])  02/14/2007   DVT (deep venous thrombosis) ([place]) 08/22/2006   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   Current Outpatient Medications on File Prior to Encounter  Medication Sig Dispense Refill   acetaminophen (TYLENOL) 325 MG tablet Take 2 tablets (650 mg total) by mouth every 6 (six) hours as needed for mild pain (or Fever >/= 101). 20 tablet    cyanocobalamin 100 MCG tablet Take 100 mcg by mouth daily.     cyclobenzaprine (FLEXERIL) 10 MG tablet Take 10 mg by mouth at bedtime.     darunavir-cobicistat (PREZCOBIX) 800-150 MG tablet TAKE 1 TABLET BY MOUTH DAILY . SWALLOW WHOLE DO NOT CRUSH, BREAK OR CHEW TABLETS WITH FOOD. (Patient taking differently: Take 1 tablet by mouth daily with breakfast. SWALLOW WHOLE DO NOT CRUSH, BREAK OR CHEW TABLETS WITH FOOD.) 90 tablet 3   dolutegravir (TIVICAY) 50 MG tablet Take 1 tablet (50 mg total) by mouth daily. 90 tablet 1   famotidine (PEPCID) 20 MG tablet Take 1 tablet (20 mg total) by mouth daily. 30 tablet 0   megestrol (MEGACE ES) 625 MG/5ML suspension Take 5 mLs (625 mg total) by mouth daily. 150 mL 6   Multiple Vitamin (MULTIVITAMIN WITH MINERALS) TABS tablet Take 1 tablet by mouth daily.     nutrition supplement, JUVEN, (JUVEN) PACK Take 1 packet by mouth 3 (three) times daily.     ondansetron (ZOFRAN) 4 MG tablet Take 4 mg by mouth every 6 (six) hours as needed for nausea or vomiting.     oxycodone (OXY-IR) 5 MG capsule Take 1 capsule (5 mg total) by mouth See admin instructions. Give '5mg'$  by mouth daily for wound care. May also give '5mg'$  every 6 hours as needed for chronic pain (Patient  taking differently: Take 5 mg by mouth See admin instructions. Take 5 mg by mouth every morning and an additional 5 mg every six hours as needed for severe pain) 30 capsule 0   polycarbophil (FIBERCON) 625 MG tablet Take 625 mg by mouth daily.     PRESCRIPTION MEDICATION Inject 75 mL/hr into the vein See admin instructions. Dextrose-NaCl solution 5-0.9%: 75 ml/hr via IV every day and night shift for hydration     rilpivirine (EDURANT) 25 MG TABS tablet TAKE 1 TABLET BY MOUTH DAILY WITH BREAKFAST (Patient taking differently: Take 25 mg by mouth daily with breakfast.) 90 tablet 3   senna-docusate (SENOKOT-S) 8.6-50 MG tablet Take 1 tablet by mouth 2 (two) times daily.     simethicone (MYLICON) 80 MG chewable tablet Chew 80 mg by mouth with breakfast, with lunch, and with evening meal.     VFEND 200 MG tablet Take 1 tablet (200 mg total) by mouth 2 (two) times daily. 30 tablet 3   vitamin C (ASCORBIC ACID) 500 MG tablet Take 1,000 mg by mouth daily.     zinc sulfate 220 (50 Zn) MG capsule Take 1 capsule (220 mg total) by mouth daily.     feeding supplement (  ENSURE ENLIVE / ENSURE PLUS) LIQD Take 237 mLs by mouth 3 (three) times daily between meals. (Patient not taking: Reported on 07/19/2021)      Past Medical History:  Diagnosis Date   ABLA (acute blood loss anemia) 81/82/9937   Acute metabolic encephalopathy 16/09/6787   Arthritis    Decubitus ulcer of sacral area    DVT (deep venous thrombosis) ([place]) 2008   Left leg   Exposure to hepatitis B    H/O hypotension    History of anemia    History of gout    History of transfusion    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    HIV (human immunodeficiency virus infection) ([place])    Hx of sepsis 2015   affected rt hip / femur   Pain in limb 07/23/2013   Pain in limb 07/23/2013   PONV (postoperative nausea and vomiting)    Hiccups for 4 days after anesthesia   Radiation    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   Severe sepsis; [place]  albicans bacteremia ([place]) 05/13/2020   Shingles    left shoulder, right leg   Thrombocytopenia ([place]) 12/21/2020    Past Surgical History:  Procedure Laterality Date   COLONOSCOPY     CONVERSION TO TOTAL HIP Right 06/15/2014   Procedure: REMOVAL OF ANTIBIOTIC  SPACER AND CONVERSION TO RIGHT TOTAL HIP  ARTHROPLASTY ;  Surgeon: Paralee Cancel, MD;  Location: [facility];  Service: Orthopedics;  Laterality: Right;   CYSTOSCOPY     FLEXIBLE SIGMOIDOSCOPY N/A 12/17/2020   Procedure: FLEXIBLE SIGMOIDOSCOPY;  Surgeon: Gatha Mayer, MD;  Location: [facility];  Service: Endoscopy;  Laterality: N/A;   HEMATOMA EVACUATION     HERNIA REPAIR     right X2   INCISION AND DRAINAGE HIP Right 07/31/2013   Procedure: IRRIGATION AND DEBRIDEMENT HIP WITH PERCUTANEOUS DRAIN PLACEMENT;  Surgeon: [name], MD;  Location: [facility];  Service: Orthopedics;  Laterality: Right;   INCISION AND DRAINAGE HIP Right 03/15/2015   Procedure: IRRIGATION AND DEBRIDEMENT RIGHT HIP WITH  HEAD BALL  AND POLY LINER EXCHANGE;  Surgeon: Paralee Cancel, MD;  Location: [facility];  Service: Orthopedics;  Laterality: Right;   INCISION AND DRAINAGE OF WOUND N/A 08/25/2013   Procedure: IRRIGATION AND DEBRIDEMENT OF SACRAL ULCER WITH PLACEMENT OF A CELL ;  Surgeon: Theodoro Kos, DO;  Location: [facility];  Service: Plastics;  Laterality: N/A;   INCISION AND DRAINAGE OF WOUND N/A 09/01/2013   Procedure: IRRIGATION AND DEBRIDEMENT SACRAL ULCER WITH PLACEMENT OF A CELL;  Surgeon: Theodoro Kos, DO;  Location: [facility];  Service: Plastics;  Laterality: N/A;   INCISION AND DRAINAGE OF WOUND N/A 09/08/2013   Procedure: IRRIGATION AND DEBRIDEMENT OF SACRAL ULCER WITH PLACEMENT OF A CELL AND VAC;  Surgeon: Theodoro Kos, DO;  Location: [facility];  Service: Plastics;  Laterality: N/A;   INCISION AND DRAINAGE OF WOUND N/A 09/18/2013   Procedure: IRRIGATION AND DEBRIDEMENT OF SACRAL WOUND WITH PLACEMENT OF A-CELL;  Surgeon: Theodoro Kos, DO;  Location: [facility];  Service:  Plastics;  Laterality: N/A;   PILONIDAL CYST DRAINAGE N/A 08/09/2013   Procedure: IRRIGATION AND DEBRIDEMENT SACRAL DECUBITUS;  Surgeon: Harl Bowie, MD;  Location: [facility];  Service: General;  Laterality: N/A;   RECTAL BIOPSY N/A 03/29/2020   Procedure: EXCISION OF PERIANAL TISSUE WITH BX AND 3 PUNCH BXS;  Surgeon: Ileana Roup, MD;  [facility];  Service: General;  Laterality: N/A;   TEE WITHOUT CARDIOVERSION N/A 03/22/2021   Procedure: TRANSESOPHAGEAL ECHOCARDIOGRAM (TEE);  Surgeon: Buford Dresser, MD;  [facility];  Service: Cardiovascular;  Laterality: N/A;   TOTAL HIP ARTHROPLASTY Right 08/04/2013   Procedure: RESECTION HIP JOINT - PLACEMENT OF CEMENT PROSTHESIS;  Surgeon: [name], MD;  Location: [facility];  Service: Orthopedics;  Laterality: Right;   TRANSURETHRAL RESECTION OF PROSTATE  JAN 2016    Social History   Tobacco Use   Smoking status: Former    Packs/day: 0.30    Types: Cigarettes   Smokeless tobacco: Never  Vaping Use   Vaping Use: Never used  Substance Use Topics   Alcohol use: Not Currently    Alcohol/week: 3.0 standard drinks of alcohol    Types: 3 Standard drinks or equivalent per week   Drug use: Not Currently    Types: Marijuana    Comment: occ.    Family History  Problem Relation Age of Onset   Diabetes Mother    Dementia Mother    Atrial fibrillation Mother    Hypertension Mother    Cancer Father    Emphysema Father     PE: Vitals:   07/20/21 0200 07/20/21 0300 07/20/21 0400 07/20/21 0500  BP: (!) 60/43 (!) 94/55 (!) 79/48 116/68  Pulse: (!) 112 (!) 103 (!) 104 98  Resp: (!) 23 (!) '23 19 20  '$ Temp:   98.4 F (36.9 C)   TempSrc:   Oral   SpO2: 99% 100% 100% 100%  Weight:      Height:       Patient appears to be in mild to moderate distress hiccups patient is alert and oriented x3, appears to be mentating slowly  Atraumatic normocephalic head No cervical or supraclavicular lymphadenopathy appreciated No  increased work of breathing, no audible wheezes/rhonchi Regular sinus rhythm/rate Abdomen is soft, diffuse abdominal pain, worse in the suprapubic region and right CVA There is no left-sided CVA tenderness Foley catheter is well-positioned and draining straw-colored urine. Lower extremities are symmetric without appreciable edema Grossly neurologically intact No identifiable skin lesions  Recent Labs    07/19/21 1557 07/20/21 0303  WBC 12.2* 8.1  HGB 13.9 9.3*  HCT 45.0 30.4*   Recent Labs    07/19/21 1557 07/19/21 1921 07/20/21 0303  NA 141 143 145  144  K 5.6* 5.0 4.3  4.3  CL 115* 120* 120*  121*  CO2 14* 13* 15*  15*  GLUCOSE 76 63* 71  72  BUN 118* 112* 91*  93*  CREATININE 2.87* 2.43* 1.97*  1.82*  CALCIUM 10.8* 9.7 9.4  9.2   Recent Labs    07/19/21 1557  INR 1.5*   No results for input(s): "LABURIN" in the last 72 hours. Results for orders placed or performed during the hospital encounter of 07/19/21  Resp Panel by RT-PCR (Flu A&B, Covid) Urine, Catheterized     Status: None   Collection Time: 07/19/21  5:04 PM   Specimen: Urine, Catheterized; Nasal Swab  Result Value Ref Range Status   SARS Coronavirus 2 by RT PCR NEGATIVE NEGATIVE Final    Comment: (NOTE) SARS-CoV-2 target nucleic acids are NOT DETECTED.  The SARS-CoV-2 RNA is generally detectable in upper respiratory specimens during the acute phase of infection. The lowest concentration of SARS-CoV-2 viral copies this assay can detect is 138 copies/mL. A negative result does not preclude SARS-Cov-2 infection and should not be used as the sole basis for treatment or other patient  management decisions. A negative result may occur with  improper specimen collection/handling, submission of specimen other than nasopharyngeal swab, presence of viral mutation(s) within the areas targeted by this assay, and inadequate number of viral copies(<138 copies/mL). A negative result must be combined  with clinical observations, patient history, and epidemiological information. The expected result is Negative.  Fact Sheet for Patients:  EntrepreneurPulse.com.au  Fact Sheet for Healthcare Providers:  IncredibleEmployment.be  This test is no t yet approved or cleared by the [place] FDA and  has been authorized for detection and/or diagnosis of SARS-CoV-2 by FDA under an Emergency Use Authorization (EUA). This EUA will remain  in effect (meaning this test can be used) for the duration of the COVID-19 declaration under Section 564(b)(1) of the Act, 21 U.S.C.section 360bbb-3(b)(1), unless the authorization is terminated  or revoked sooner.       Influenza A by PCR NEGATIVE NEGATIVE Final   Influenza B by PCR NEGATIVE NEGATIVE Final    Comment: (NOTE) The Xpert Xpress SARS-CoV-2/FLU/RSV plus assay is intended as an aid in the diagnosis of influenza from Nasopharyngeal swab specimens and should not be used as a sole basis for treatment. Nasal washings and aspirates are unacceptable for Xpert Xpress SARS-CoV-2/FLU/RSV testing.  Fact Sheet for Patients: EntrepreneurPulse.com.au  Fact Sheet for Healthcare Providers: IncredibleEmployment.be  This test is not yet approved or cleared by the [place] FDA and has been authorized for detection and/or diagnosis of SARS-CoV-2 by FDA under an Emergency Use Authorization (EUA). This EUA will remain in effect (meaning this test can be used) for the duration of the COVID-19 declaration under Section 564(b)(1) of the Act, 21 U.S.C. section 360bbb-3(b)(1), unless the authorization is terminated or revoked.  Performed at [facility], [place] [address], [place], [place] [address]   Culture, blood (Routine x 2)     Status: None (Preliminary result)   Collection Time: 07/19/21  5:42 PM   Specimen: BLOOD  Result Value Ref Range Status   Specimen  Description   Final    BLOOD LEFT ANTECUBITAL Performed at [facility] [address] [place], [place] [address]    Special Requests   Final    BOTTLES DRAWN AEROBIC AND ANAEROBIC Blood Culture results may not be optimal due to an excessive volume of blood received in culture bottles Performed at [facility] [address], [place], [place] [address]    Culture  Setup Time   Final    GRAM NEGATIVE RODS ANAEROBIC BOTTLE ONLY Organism ID to follow Performed at [facility], [place] [address], [place], [place] [address]    Culture PENDING  Incomplete   Report Status PENDING  Incomplete  Culture, blood (Routine x 2)     Status: None (Preliminary result)   Collection Time: 07/19/21  5:42 PM   Specimen: BLOOD  Result Value Ref Range Status   Specimen Description   Final    BLOOD RIGHT ANTECUBITAL Performed at [facility] [address], [place], [place] [address]    Special Requests   Final    BOTTLES DRAWN AEROBIC AND ANAEROBIC Blood Culture results may not be optimal due to an excessive volume of blood received in culture bottles Performed at [facility] [address] [place], [place] [address]    Culture  Setup Time   Final    GRAM NEGATIVE RODS ANAEROBIC BOTTLE ONLY CRITICAL VALUE NOTED.  VALUE IS CONSISTENT WITH PREVIOUSLY REPORTED AND  CALLED VALUE. Performed at [facility], [place] [address], [place], [place] [address]    Culture PENDING  Incomplete   Report Status PENDING  Incomplete  MRSA Next Gen by PCR, Nasal     Status: Abnormal   Collection Time: 07/19/21 10:34 PM   Specimen: Nasal Mucosa; Nasal Swab  Result Value Ref Range Status   MRSA by PCR Next Gen DETECTED (A) NOT DETECTED Final    Comment: RESULT CALLED TO, READ BACK BY AND VERIFIED WITH: B,CLAPP AT 0122 ON 07/20/21 BY A,MOHAMED (NOTE) The GeneXpert MRSA Assay (FDA approved for NASAL specimens only), is one component  of a comprehensive MRSA colonization surveillance program. It is not intended to diagnose MRSA infection nor to guide or monitor treatment for MRSA infections. Test performance is not FDA approved in patients less than 29 years old. Performed at [facility], [place] [address] [place], [place] [address]     Imaging: Independently reviewed the patient's CT scan as well as the abdominal ultrasound.  The patient has bilateral hydroureteronephrosis and a distended bladder on the CT scan despite a Foley catheter in place.  Imp:  Patient likely has a urosepsis secondary to a poorly draining Foley catheter.  The Foley catheter since been changed.  He increased blood pressure support requirement although his lactate is improving, white count is also improving, and his renal function is getting better.  Not sure what to make of his gallbladder,would recommend that general surgery weigh in as to whether any further work-up is necessary.  Recommendations: At this point, I will recommend ongoing conservative management with fluid resuscitation, broad-spectrum antibiotics, and blood pressure support.  I would recommend that we obtain a renal ultrasound later this afternoon for reevaluation of the patient's hydronephrosis now that his Foley appears to be draining.  If the patient continues to require increasing amounts of blood pressure support he may need decompression of his collecting system.  This may be best done with nephrostomy tubes given his history of pelvic radiation and urethral stricture.     I will continue to follow closely.  Ardis Hughs

## 2021-07-20 NOTE — Progress Notes (Signed)
NAME:  [name] [name], MRN:  [id number], DOB:  24-Sep-1960, LOS: 1 ADMISSION DATE:  07/19/2021, CONSULTATION DATE:  07/19/2021 REFERRING MD:  Dr Tyrell Antonio, CHIEF COMPLAINT:  Sepsis   History of Present Illness:  [name] [name] is a 61 year old gentleman with a history of HIV, previous endocarditis, previous osteomyelitis, baseline debility being bedbound for the past 1 to 2 years, decubitus ulcer, urinary retention requiring suprapubic catheter, recurrent urinary tract infections who presented after feeling poorly for the past 2 days.  He has had worse back pain and muscle spasms in his baseline, fevers, chills, hiccups, abdominal pain, nausea, vomiting.  His oral intake is increased recently since being started on Megace by his infectious disease physician, but over the last 2 days he has not been eating and drinking normally.  He was brought to the ED for evaluation.  He was given 2 L of crystalloid and started empirically on vancomycin, cefepime.  In the ED lactic acid was 5, PCCM was consulted for evaluation.  His wife is at bedside to corroborate history.  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    HIV (human immunodeficiency virus infection) ([place])    Hx of sepsis 2015   affected rt hip / femur   Pain in limb 07/23/2013   Pain in limb 07/23/2013   PONV (postoperative nausea and vomiting)    Hiccups for 4 days after anesthesia   Radiation    Rectal cancer (HCC)    Squamous cell   Severe sepsis; Candidia albicans bacteremia ([place]) 05/13/2020   Shingles    left shoulder, right leg   Thrombocytopenia ([place]) 12/21/2020      Significant Hospital Events: Including procedures, antibiotic start and stop dates in addition to other pertinent events   7/11-admitted  Interim History / Subjective:  Arousable Just feels sore all over generally  Objective   Blood pressure 111/69, pulse 99, temperature 97.8 F (36.6 C), temperature source Oral, resp. rate 18, height '6\' 2"'$  (1.88 m), weight 53.2 kg, SpO2 99 %.        Intake/Output Summary (Last 24 hours) at 07/20/2021 0943 Last data filed at 07/20/2021 9767 Gross per 24 hour  Intake 2449.96 ml  Output 2475 ml  Net -25.04 ml   Filed Weights   07/19/21 1556 07/19/21 2243  Weight: 58.1 kg 53.2 kg    Examination: General: Chronically ill-appearing, does not appear to be in acute distress HENT: Temporal wasting, dry oral mucosa Lungs: Clear breath sounds bilaterally Cardiovascular: S1-S2 appreciated Abdomen: Soft, bowel sounds appreciated Extremities: No clubbing, no edema Neuro: Awake and alert, moving all extremities GU:   Resolved Hospital Problem list     Assessment & Plan:  Sepsis due to urinary tract infection Lactic acidosis -Lactate improving -On pressors -Peripheral pressors at 8 mics of Levophed  Hydronephrosis -With improved urinary output -Repeat ultrasound this afternoon -Appreciate urology consultation and involvement  AKI Azotemia Hyperkalemia -Volume resuscitation -Trend electrolytes -Avoid nephrotoxic's  Hyperbilirubinemia -Stones, no evidence of obstruction  HIV -Resumed antiretroviral  therapy  Debility -Nutritional support  Sacral decubitus ulcer -Consult wound care  Failure to thrive Malnutrition present on admission  Best Practice (right click and "Reselect all SmartList Selections" daily)   Diet/type: Regular consistency (see orders) DVT prophylaxis: SCD GI prophylaxis: N/A Lines: N/A Foley:  N/A Code Status:  full code Last date of multidisciplinary goals of care discussion [he is DNR  status]  Labs   CBC: Recent Labs  Lab 07/19/21 1557 07/20/21 0303  WBC 12.2* 8.1  NEUTROABS 11.5*  --   HGB 13.9 9.3*  HCT 45.0 30.4*  MCV 101.4* 101.7*  PLT 301 622    Basic Metabolic Panel: Recent Labs  Lab 07/19/21 1557 07/19/21 1921 07/20/21 0303 07/20/21 0827  NA 141 143 145  144 144  K 5.6* 5.0 4.3  4.3 4.0  CL 115* 120* 120*  121* 120*  CO2 14* 13* 15*  15* 16*  GLUCOSE 76 63* 71  72 112*  BUN 118* 112* 91*  93* 84*  CREATININE 2.87* 2.43* 1.97*  1.82* 1.80*  CALCIUM 10.8* 9.7 9.4  9.2 9.4  MG  --  1.9  --   --   PHOS  --  3.6  --   --    GFR: Estimated Creatinine Clearance: 33.3 mL/min (A) (by C-G formula based on SCr of 1.8 mg/dL (H)). Recent Labs  Lab 07/19/21 1557 07/19/21 1907 07/19/21 1921 07/20/21 0015 07/20/21 0303  PROCALCITON  --   --  20.40  --   --   WBC 12.2*  --   --   --  8.1  LATICACIDVEN 5.0* 3.6*  --  4.9* 2.4*    Liver Function Tests: Recent Labs  Lab 07/19/21 1557 07/20/21 0303  AST 40 '29  29  29  '$ ALT '21 16  16  14  '$ ALKPHOS 64 42  42  44  BILITOT 1.5* 1.2  1.0  1.1  PROT 8.0 5.2*  5.3*  5.4*  ALBUMIN 3.2* 2.2*  2.2*  2.3*   No results for input(s): "LIPASE", "AMYLASE" in the last 168 hours. No results for input(s): "AMMONIA" in the last 168 hours.  ABG    Component Value Date/Time   PHART 7.434 05/13/2020 0420   PCO2ART 22.3 (L) 05/13/2020 0420   PO2ART 103 05/13/2020 0420   HCO3 14.4 (L) 07/19/2021 1923   TCO2 15 (L) 03/17/2021 1755   ACIDBASEDEF 10.2 (H) 07/19/2021 1923   O2SAT 30.1 07/19/2021 1923     Coagulation Profile: Recent Labs  Lab 07/19/21 1557  INR 1.5*    Cardiac Enzymes: Recent Labs  Lab 07/19/21 1921  CKTOTAL 401*    HbA1C: Hgb A1c MFr Bld  Date/Time Value Ref Range Status  05/18/2020 10:36 AM 5.5 4.8 - 5.6 % Final    Comment:    (NOTE) Pre diabetes:          5.7%-6.4%  Diabetes:              >6.4%  Glycemic control for   <7.0% adults with diabetes    08/14/2013 05:00 AM 5.9 (H) <5.7 % Final    Comment:    (NOTE)  According to the ADA Clinical Practice Recommendations for 2011, when HbA1c is used as a screening test:  >=6.5%   Diagnostic of Diabetes Mellitus           (if abnormal result is confirmed) 5.7-6.4%   Increased risk of developing Diabetes Mellitus References:Diagnosis and Classification of Diabetes Mellitus,Diabetes FGHW,2993,71(IRCVE 1):S62-S69 and Standards of Medical Care in         Diabetes - 2011,Diabetes LFYB,0175,10 (Suppl 1):S11-S61.    CBG: Recent Labs  Lab 07/20/21 0204 07/20/21 0351 07/20/21 0418 07/20/21 0605 07/20/21 0802  GLUCAP 80 68* 98 103* 122*    Review of Systems:   Discomfort  Past Medical History:  He,  has a past medical history of ABLA (acute blood loss anemia) (25/85/2778), Acute metabolic encephalopathy (24/02/3534), Arthritis, Decubitus ulcer of sacral area, DVT (deep venous thrombosis) ([place]) (2008), Exposure to hepatitis B, H/O hypotension, History of anemia, History of gout, History of transfusion, History of urinary retention, HIV (human immunodeficiency virus infection) ([place]), sepsis (2015), Pain in limb (07/23/2013), Pain in limb (07/23/2013), PONV (postoperative nausea and vomiting), Radiation, Rectal cancer (HCC), Severe sepsis; Candidia albicans bacteremia ([place]) (05/13/2020), Shingles, and Thrombocytopenia ([place]) (12/21/2020).   Surgical History:   Past Surgical History:  Procedure Laterality Date   COLONOSCOPY     CONVERSION TO TOTAL HIP Right 06/15/2014   Procedure: REMOVAL OF ANTIBIOTIC  SPACER AND CONVERSION TO RIGHT TOTAL HIP  ARTHROPLASTY ;  Surgeon: Paralee Cancel, MD;  Location: [facility];  Service: Orthopedics;  Laterality: Right;   CYSTOSCOPY     FLEXIBLE SIGMOIDOSCOPY N/A 12/17/2020   Procedure: FLEXIBLE SIGMOIDOSCOPY;  Surgeon: Gatha Mayer, MD;  Location: [facility];  Service: Endoscopy;  Laterality: N/A;    HEMATOMA EVACUATION     HERNIA REPAIR     right X2   INCISION AND DRAINAGE HIP Right 07/31/2013   Procedure: IRRIGATION AND DEBRIDEMENT HIP WITH PERCUTANEOUS DRAIN PLACEMENT;  Surgeon: [name], MD;  Location: [facility];  Service: Orthopedics;  Laterality: Right;   INCISION AND DRAINAGE HIP Right 03/15/2015   Procedure: IRRIGATION AND DEBRIDEMENT RIGHT HIP WITH  HEAD BALL  AND POLY LINER EXCHANGE;  Surgeon: Paralee Cancel, MD;  [facility]: [facility];  Service: Orthopedics;  Laterality: Right;   INCISION AND DRAINAGE OF WOUND N/A 08/25/2013   Procedure: IRRIGATION AND DEBRIDEMENT OF SACRAL ULCER WITH PLACEMENT OF A CELL ;  Surgeon: Theodoro Kos, DO;  Location: [facility];  Service: Plastics;  Laterality: N/A;   INCISION AND DRAINAGE OF WOUND N/A 09/01/2013   Procedure: IRRIGATION AND DEBRIDEMENT SACRAL ULCER WITH PLACEMENT OF A CELL;  Surgeon: Theodoro Kos, DO;  Location: [facility];  Service: Plastics;  Laterality: N/A;   INCISION AND DRAINAGE OF WOUND N/A 09/08/2013   Procedure: IRRIGATION AND DEBRIDEMENT OF SACRAL ULCER WITH PLACEMENT OF A CELL AND VAC;  Surgeon: Theodoro Kos, DO;  Location: [facility];  Service: Plastics;  Laterality: N/A;   INCISION AND DRAINAGE OF WOUND N/A 09/18/2013   Procedure: IRRIGATION AND DEBRIDEMENT OF SACRAL WOUND WITH PLACEMENT OF A-CELL;  Surgeon: Theodoro Kos, DO;  Location: [facility];  Service: Plastics;  Laterality: N/A;   PILONIDAL CYST DRAINAGE N/A 08/09/2013   Procedure: IRRIGATION AND DEBRIDEMENT SACRAL DECUBITUS;  Surgeon: Harl Bowie, MD;  Location: [facility];  Service: General;  Laterality: N/A;   RECTAL BIOPSY N/A 03/29/2020   Procedure: EXCISION OF PERIANAL TISSUE WITH BX AND 3 PUNCH BXS;  Surgeon: Ileana Roup, MD;  [facility];  Service: General;  Laterality: N/A;   TEE WITHOUT CARDIOVERSION N/A 03/22/2021   Procedure: TRANSESOPHAGEAL ECHOCARDIOGRAM (TEE);  Surgeon: Buford Dresser, MD;  Location: [facility];  Service: Cardiovascular;  Laterality: N/A;    TOTAL HIP ARTHROPLASTY Right 08/04/2013   Procedure: RESECTION HIP JOINT - PLACEMENT OF CEMENT PROSTHESIS;  Surgeon: [name], MD;  Location: [facility];  Service: Orthopedics;  Laterality: Right;   TRANSURETHRAL RESECTION OF PROSTATE  JAN 2016     Social History:   reports that he has quit smoking. His smoking use included cigarettes. He smoked an average of .3 packs per day. He has never used smokeless tobacco. He reports that he does not currently use alcohol after a past usage of about 3.0 standard drinks of alcohol per week. He reports that he does not currently use drugs after having used the following drugs: Marijuana.   Family History:  His family history includes Atrial fibrillation in his mother; Cancer in his father; Dementia in his mother; Diabetes in his mother; Emphysema in his father; Hypertension in his mother.   Allergies Allergies  Allergen Reactions   Collagen Rash and Other (See Comments)    Redness and "Allergic," per [facility]     Home Medications  Prior to Admission medications   Medication Sig Start Date End Date Taking? Authorizing Provider  acetaminophen (TYLENOL) 325 MG tablet Take 2 tablets (650 mg total) by mouth every 6 (six) hours as needed for mild pain (or Fever >/= 101). 12/23/20  Yes Sheikh, Omair Latif, DO  cyanocobalamin 100 MCG tablet Take 100 mcg by mouth daily.   Yes [provider]  cyclobenzaprine (FLEXERIL) 10 MG tablet Take 10 mg by mouth at bedtime. 02/09/21  Yes [provider]  darunavir-cobicistat (PREZCOBIX) 800-150 MG tablet TAKE 1 TABLET BY MOUTH DAILY . SWALLOW WHOLE DO NOT CRUSH, BREAK OR CHEW TABLETS WITH FOOD. Patient taking differently: Take 1 tablet by mouth daily with breakfast. SWALLOW WHOLE DO NOT CRUSH, BREAK OR CHEW TABLETS WITH FOOD. 06/09/21  Yes [name], MD  dolutegravir (TIVICAY) 50 MG tablet Take 1 tablet (50 mg total) by mouth daily. 05/13/21  Yes [name], MD  famotidine (PEPCID) 20 MG tablet  Take 1 tablet (20 mg total) by mouth daily. 12/23/20  Yes Sheikh, Omair Latif, DO  megestrol (MEGACE ES) 625 MG/5ML suspension Take 5 mLs (625 mg total) by mouth daily. 06/09/21  Yes [name], MD  Multiple Vitamin (MULTIVITAMIN WITH MINERALS) TABS tablet Take 1 tablet by mouth daily. 05/24/21  Yes Hongalgi, Lenis Dickinson, MD  nutrition supplement, JUVEN, (JUVEN) PACK Take 1 packet by mouth 3 (three) times daily.   Yes [provider]  ondansetron (ZOFRAN) 4 MG tablet Take 4 mg by mouth every 6 (six) hours as needed for nausea or vomiting. 07/19/21 07/22/21 Yes [provider]  oxycodone (OXY-IR) 5 MG capsule Take 1 capsule (5 mg total) by mouth See admin instructions. Give '5mg'$  by mouth daily for wound care. May also give '5mg'$  every 6 hours as needed for chronic pain Patient taking differently: Take 5 mg by mouth See admin instructions. Take 5 mg by mouth every morning and an additional 5 mg every six hours as needed for severe pain 03/23/21  Yes [place] Donnamarie Poag, DO  polycarbophil (FIBERCON) 625 MG tablet Take 625 mg by mouth daily.   Yes [provider]  PRESCRIPTION MEDICATION Inject 75 mL/hr into the vein See admin instructions. Dextrose-NaCl solution 5-0.9%: 75 ml/hr via IV every day and  night shift for hydration 07/19/21  Yes [provider]  rilpivirine (EDURANT) 25 MG TABS tablet TAKE 1 TABLET BY MOUTH DAILY WITH BREAKFAST Patient taking differently: Take 25 mg by mouth daily with breakfast. 06/09/21  Yes [name], MD  senna-docusate (SENOKOT-S) 8.6-50 MG tablet Take 1 tablet by mouth 2 (two) times daily. 05/24/21  Yes Hongalgi, Lenis Dickinson, MD  simethicone (MYLICON) 80 MG chewable tablet Chew 80 mg by mouth with breakfast, with lunch, and with evening meal.   Yes [provider]  VFEND 200 MG tablet Take 1 tablet (200 mg total) by mouth 2 (two) times daily. 05/12/21  Yes [name], MD  vitamin C (ASCORBIC ACID) 500 MG tablet Take 1,000 mg by  mouth daily.   Yes [provider]  zinc sulfate 220 (50 Zn) MG capsule Take 1 capsule (220 mg total) by mouth daily. 12/24/20  Yes Sheikh, [place], DO  feeding supplement (ENSURE ENLIVE / ENSURE PLUS) LIQD Take 237 mLs by mouth 3 (three) times daily between meals. Patient not taking: Reported on 07/19/2021 05/24/21   Modena Jansky, MD    The patient is critically ill with multiple organ systems failure and requires high complexity decision making for assessment and support, frequent evaluation and titration of therapies, application of advanced monitoring technologies and extensive interpretation of multiple databases. Critical Care Time devoted to patient care services described in this note independent of APP/resident time (if applicable)  is 35 minutes.   Sherrilyn Rist MD [place] [facility] Personal pager: See Amion If unanswered, please page CCM On-call: [phone]

## 2021-07-20 NOTE — Progress Notes (Signed)
PT Cancellation Note  Patient Details Name: BRODAN GREWELL MRN: [id number] DOB: 1960/06/01   Cancelled Treatment:    Reason Eval/Treat Not Completed: PT screened, no needs identified, will sign off, Patient known from previous admissions. Patient comes from [place], has contractures and  requires total care.  [place] Office [phone] Weekend [phone]    [name] 07/20/2021, 8:14 AM

## 2021-07-20 NOTE — Plan of Care (Signed)
  Problem: Respiratory: Goal: Ability to maintain adequate ventilation will improve Outcome: Progressing   Problem: Fluid Volume: Goal: Hemodynamic stability will improve Outcome: Not Progressing   Problem: Clinical Measurements: Goal: Diagnostic test results will improve Outcome: Not Progressing Goal: Signs and symptoms of infection will decrease Outcome: Not Progressing

## 2021-07-20 NOTE — Assessment & Plan Note (Signed)
Continue Vfend at 200 mg twice daily appreciate ID consult

## 2021-07-20 NOTE — Assessment & Plan Note (Signed)
In the setting of sepsis dehydration Order urine electrolytes rehydrate follow renal function Given bilateral hydro will need to discuss with urology in a.m.

## 2021-07-20 NOTE — Assessment & Plan Note (Signed)
Patient for now n.p.o. given abnormal CT imaging Start D5 half-normal in addition to LR Follow CBG every 2 hours and correct as needed Would benefit from nutritional consult Administer IV thiamine

## 2021-07-20 NOTE — Progress Notes (Addendum)
[facility] Progress Note Patient Name: [name] [name] DOB: 1960-12-05 MRN: [id number]   Date of Service  07/20/2021  HPI/Events of Note  Multiple issues: 1. Patient remains hypotensive with BP = 78/55 with MAP = 59 post 1 liter 0.9 NaCl bolus. No central venous line or CVP. 2. Hypoglycemia - 2 episodes with blood glucose = 47 and 68. Currently on D5 0.45 NaCl at 75 mL/hour.Already given D50.  eICU Interventions  Plan: Norepinephrine IV infusion via PIV. Titrate to MAP >= 65. Add D10W IV infusion at 50 mL/hour.      Intervention Category Major Interventions: Hypotension - evaluation and management  Aricela Bertagnolli Eugene 07/20/2021, 3:56 AM

## 2021-07-20 NOTE — Progress Notes (Signed)
[facility] Progress Note Patient Name: [name] [name] DOB: [birth year] MRN: [id number]   Date of Service  07/20/2021  HPI/Events of Note  Nursing feels that patient has had a rhythm change. STAT EKG reveals: Sinus tachycardia with 1st degree A-V block. Left axis deviation. Left ventricular hypertrophy with QRS widening and repolarization abnormality ( R in aVL , Cornell product ). Lateral infarct , age undetermined. Prolonged QT. K+ = 4.1 at 8 PM.   eICU Interventions  Plan: Mg++ level STAT. Cycle Troponin.      Intervention Category Major Interventions: Arrhythmia - evaluation and management  [name] 07/20/2021, 10:23 PM

## 2021-07-20 NOTE — Assessment & Plan Note (Signed)
Suspicious for cholecystitis although clinically less likely.  Obtain right upper quad ultrasound check LFTs

## 2021-07-20 NOTE — Progress Notes (Signed)
eLink Physician-Brief Progress Note Patient Name: [name] [name] DOB: February 23, 1960 MRN: [id number]   Date of Service  07/20/2021  HPI/Events of Note  Hyperglycemia - Blood glucose = 187.   eICU Interventions  Plan: D/C D10W IV infusion. Change POC blood glucose to Q 4 hours.      Intervention Category Major Interventions: Hyperglycemia - active titration of insulin therapy  Lysle Dingwall 07/20/2021, 8:31 PM

## 2021-07-20 NOTE — Assessment & Plan Note (Signed)
-  SIRS criteria met with  elevated white blood cell count,       Component Value Date/Time   WBC 12.2 (H) 07/19/2021 1557   LYMPHSABS 0.4 (L) 07/19/2021 1557   LYMPHSABS 0.9 06/19/2008 1117   tachycardia   , fever  RR >20 Today's Vitals   07/19/21 2000 07/19/21 2130 07/19/21 2243 07/20/21 0000  BP: 101/69 92/64    Pulse: (!) 111 (!) 110    Resp: (!) 28 18    Temp:  (!) 97.4 F (36.3 C) 98.4 F (36.9 C) 99.8 F (37.7 C)  TempSrc:  Oral Oral Axillary  SpO2: 96% 100%    Weight:   53.2 kg   Height:   _0  (1.88 m)   PainSc:        The recent clinical data is shown below. Vitals:   07/19/21 2000 07/19/21 2130 07/19/21 2243 07/20/21 0000  BP: 101/69 92/64    Pulse: (!) 111 (!) 110    Resp: (!) 28 18    Temp:  (!) 97.4 F (36.3 C) 98.4 F (36.9 C) 99.8 F (37.7 C)  TempSrc:  Oral Oral Axillary  SpO2: 96% 100%    Weight:   53.2 kg   Height:   _1  (1.88 m)      -Most likely source being:  Urinary, intra-abdominal,   soft tissue infection,       Patient meeting criteria for Severe sepsis with    evidence of end organ damage/organ dysfunction such as    Acute Kidney Injury with Cr > 2,  Lab Results  Component Value Date   CREATININE 2.43 (H) 07/19/2021   CREATININE 2.87 (H) 07/19/2021    elevated lactic acid >2     Component Value Date/Time   LATICACIDVEN 3.6 (HH) 07/19/2021 5188    acute metabolic encephalopathy  CZY<60 mmhg or MAP < 65 mmhg,     Patient is meeting criteria for SEPTIC SHOCK with  lactic acid > 4 or multiple SBP readings below 90 mmhg or MAP reading below 65 mmhg   - Obtain serial lactic acid and procalcitonin level.  - Initiated IV antibiotics in ER: Antibiotics Given (last 72 hours)    Date/Time Action Medication Dose Rate   07/19/21 2133 New Bag/Given   vancomycin (VANCOCIN) IVPB 1000 mg/200 mL premix 1,000 mg 200 mL/hr      Will continue  on : Cefepime and vancomycin   - await results of blood and urine culture  - Rehydrate  aggressively  Intravenous fluids were administered    30cc/kg fluid    12:10 AM

## 2021-07-20 NOTE — Consult Note (Signed)
[name]                                                                                        Patient Identification: Patient Name: [name] [name] MRN: [id number] [name] Date: 07/19/2021  3:39 PM Today's Date: 07/20/2021 Reason for consult: Septic shock, UTI Requesting provider: Niel [name]  Active Problems:   Human immunodeficiency virus (HIV) disease ([place])   Unspecified severe protein-calorie malnutrition ([place])   Protein-calorie malnutrition, severe ([place])   Acute renal failure superimposed on stage 3a chronic kidney disease ([place])   Chronic diastolic CHF (congestive heart failure) (HCC)   Normochromic normocytic anemia   Pressure injury of skin   Acute urinary retention   Candidal endocarditis   Endocarditis of mitral valve   Osteomyelitis (HCC)   Hypoglycemia   Abnormal CT of the abdomen   Severe sepsis (HCC)   Antibiotics:  Vancomycin 7/11--c Cefepime 7/11-c  Lines/Hardware: RT THA  Assessment #Septic shock secondary to below  # Proteus bacteremia   #Acute UTI with moderate to advanced bilateral hydroureteronephrosis in the setting of 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 He is getting wound care with no concerns of infection like drainage/foul smell prior to admission. Less likely to be the source for bacteremia and sepsis. Can consider MRI pelvis/biopsy when stable to see if  acute on chronic osteomyelitis   # left plantar foot ulcer ( necrotic) and Rt lateral foot wound   # HIV -followed by Dr. Johnnye [name], on Prezcobix/Endurant/Tivicay Lab Results  Component Value  Date   HIV1RNAQUANT 803 (H) 06/09/2021   Lab Results  Component Value Date   CD4TABS 680 06/09/2021   CD4TABS 258 (L) 05/23/2021   CD4TABS 487 03/18/2021   # Functional Quadriplegia  Recommendations  DC Vancomycin and cefepime, start ceftriaxone  Follow-up blood cultures, urine cultures Follow-up urology recommendations Monitor CBC and BMP Wound care consult  Fu MRI pelvis and left ankle  Following   Rest of the management as per the primary team. Please call with questions or concerns.  Thank you for the consult  [name] Oz, MD Infectious Disease Physician [facility] [address] 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 HIV on ART( prezcobix/edurant/tivicay) , chronic sacral DU ( s/p PICC line and IV vanc/zosyn on 09/12/20>> doxycycline/augmentin 10/25/20, candida albicans bacteremia/MV endocarditis s/p completion of anidulafungin followed by Voriconazole presented to the ED on 7/11 with low blood pressure/tachycardia and concern for sepsis.  He had prior chills before ED visit with frequent hiccups  At ED Tmax 100.6 Labs remarkable for K5.6, AKI with creatinine 2.87, lactic acid 5, WBC 12.2 7/11 blood  culture and urine culture pending, UA -greater than 50 RBC and WBC 7/11 chest x-ray with no acute abnormality 7/11 CT abdomen pelvis moderate to advanced bilateral hydroureteronephrosis with progression.  No evidence of obstructing calculi.  Bladder is mildly distended despite appropriate Foley catheter placement.  Moderate bladder wall thickening with mild perivascular fat  stranding PCS for UTI.  Recommend correlation with UA.  Sacral decubitus ulcer with resorptive changes of the sacrum and coccyx.  Progression from prior exam suggestive of osteomyelitis.  Presacral soft tissue thickening without drainable abscess or collection.  Distended gallbladder without calcified gallstone if there is clinical concern for acute cholecystitis, recommend right upper quadrant ultrasound.  Punctate nonobstructing intrarenal stones  Spoke to sister at bedside, gets regular wound care for sacral wound at the facility. Patient denies any h/o sacral drainage, foul smell and concerns of infection  ROS: All systems reviewed with pertinent positives and negatives as listed above   Past Medical History:  Diagnosis Date   ABLA (acute blood loss anemia) 58/85/0277   Acute metabolic encephalopathy 41/02/8784   Arthritis    Decubitus ulcer of sacral area    DVT (deep venous thrombosis) ([place]) 2008   Left leg   Exposure to hepatitis B    H/O hypotension    History of anemia    History of gout    History of transfusion    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    HIV (human immunodeficiency virus infection) ([place])    Hx of sepsis 2015   affected rt hip / femur   Pain in limb 07/23/2013   Pain in limb 07/23/2013   PONV (postoperative nausea and vomiting)    Hiccups for 4 days after anesthesia   Radiation    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   Severe sepsis; Candidia albicans bacteremia ([place]) 05/13/2020   Shingles    left shoulder, right leg   Thrombocytopenia ([place]) 12/21/2020   Past Surgical History:  Procedure Laterality Date   COLONOSCOPY     CONVERSION TO TOTAL HIP Right 06/15/2014   Procedure: REMOVAL OF ANTIBIOTIC  SPACER AND CONVERSION TO RIGHT TOTAL HIP  ARTHROPLASTY ;  Surgeon: Paralee Cancel, MD;  Location: [facility];  Service: Orthopedics;  Laterality: Right;   CYSTOSCOPY     FLEXIBLE SIGMOIDOSCOPY N/A 12/17/2020   Procedure: FLEXIBLE SIGMOIDOSCOPY;  Surgeon:  Gatha Mayer, MD;  Location: [facility];  Service: Endoscopy;  Laterality: N/A;   HEMATOMA EVACUATION     HERNIA REPAIR     right X2   INCISION AND DRAINAGE HIP Right 07/31/2013   Procedure: IRRIGATION AND DEBRIDEMENT HIP WITH PERCUTANEOUS DRAIN PLACEMENT;  Surgeon: [name], MD;  Location: [facility];  Service: Orthopedics;  Laterality: Right;   INCISION AND DRAINAGE HIP Right 03/15/2015   Procedure: IRRIGATION AND DEBRIDEMENT RIGHT HIP WITH  HEAD BALL  AND POLY LINER EXCHANGE;  Surgeon: Paralee Cancel, MD;  Location: [facility];  Service: Orthopedics;  Laterality: Right;   INCISION AND DRAINAGE OF WOUND N/A 08/25/2013   Procedure: IRRIGATION AND DEBRIDEMENT OF SACRAL ULCER WITH PLACEMENT OF A CELL ;  Surgeon: Theodoro Kos, DO;  Location: [facility];  Service: Plastics;  Laterality: N/A;   INCISION AND DRAINAGE OF WOUND N/A 09/01/2013   Procedure: IRRIGATION AND DEBRIDEMENT SACRAL ULCER WITH PLACEMENT OF A CELL;  Surgeon: Theodoro Kos, DO;  Location: [facility];  Service: Plastics;  Laterality: N/A;   INCISION AND DRAINAGE OF WOUND N/A 09/08/2013  Procedure: IRRIGATION AND DEBRIDEMENT OF SACRAL ULCER WITH PLACEMENT OF A CELL AND VAC;  Surgeon: Theodoro Kos, DO;  Location: [facility];  Service: Plastics;  Laterality: N/A;   INCISION AND DRAINAGE OF WOUND N/A 09/18/2013   Procedure: IRRIGATION AND DEBRIDEMENT OF SACRAL WOUND WITH PLACEMENT OF A-CELL;  Surgeon: Theodoro Kos, DO;  Location: [facility];  Service: Plastics;  Laterality: N/A;   PILONIDAL CYST DRAINAGE N/A 08/09/2013   Procedure: IRRIGATION AND DEBRIDEMENT SACRAL DECUBITUS;  Surgeon: Harl Bowie, MD;  Location: [facility];  Service: General;  Laterality: N/A;   RECTAL BIOPSY N/A 03/29/2020   Procedure: EXCISION OF PERIANAL TISSUE WITH BX AND 3 PUNCH BXS;  Surgeon: Ileana Roup, MD;  [facility];  Service: General;  Laterality: N/A;   TEE WITHOUT CARDIOVERSION N/A 03/22/2021   Procedure: TRANSESOPHAGEAL ECHOCARDIOGRAM (TEE);  Surgeon:  Buford Dresser, MD;  [facility];  Service: Cardiovascular;  Laterality: N/A;   TOTAL HIP ARTHROPLASTY Right 08/04/2013   Procedure: RESECTION HIP JOINT - PLACEMENT OF CEMENT PROSTHESIS;  Surgeon: [name], MD;  Location: [facility];  Service: Orthopedics;  Laterality: Right;   TRANSURETHRAL RESECTION OF PROSTATE  JAN 2016     Scheduled Meds:  Chlorhexidine Gluconate Cloth  6 each Topical Daily   dolutegravir  50 mg Oral Daily   famotidine  20 mg Oral Daily   feeding supplement  237 mL Oral TID BM   midodrine  2.5 mg Oral TID WC   mupirocin ointment  1 Application Nasal BID   mouth rinse  15 mL Mouth Rinse 4 times per day   rilpivirine  25 mg Oral Q breakfast   thiamine injection  100 mg Intravenous Daily   voriconazole  200 mg Oral Q12H   Continuous Infusions:  sodium chloride 10 mL/hr at 07/20/21 0514   ceFEPime (MAXIPIME) IV     dextrose 50 mL/hr at 07/20/21 0514   dextrose 5 % and 0.45% NaCl 75 mL/hr at 07/20/21 0514   lactated ringers 50 mL/hr at 07/20/21 0526   norepinephrine (LEVOPHED) Adult infusion 5 mcg/min (07/20/21 0515)   vancomycin     PRN Meds:.acetaminophen **OR** acetaminophen, dextrose, HYDROmorphone (DILAUDID) injection, metoCLOPramide (REGLAN) injection, metoCLOPramide (REGLAN) injection, mouth rinse, oxyCODONE  Allergies  Allergen Reactions   Collagen Rash and Other (See Comments)    Redness and "Allergic," per [facility]   Social History   Socioeconomic History   Marital status: Single    Spouse name: Not on file   Number of children: 0   Years of education: BA   Highest education level: Not on file  Occupational History   Occupation: disabled    Employer: [employer]  Tobacco Use   Smoking status: Former    Packs/day: 0.30    Types: Cigarettes   Smokeless tobacco: Never  Vaping Use   Vaping Use: Never used  Substance and Sexual Activity   Alcohol use: Not Currently    Alcohol/week: 3.0 standard drinks of alcohol    Types: 3  Standard drinks or equivalent per week   Drug use: Not Currently    Types: Marijuana    Comment: occ.   Sexual activity: Never    Partners: Male    Comment: declined condoms  Other Topics Concern   Not on file  Social History Narrative   Not on file   Social Determinants of Health   Financial Resource Strain: Not on file  Food Insecurity: Not on file  Transportation Needs: Not on file  Physical Activity: Not on file  Stress: Not on file  Social Connections: Not on file  Intimate Partner Violence: Not on file   Family History  Problem Relation Age of Onset   Diabetes Mother    Dementia Mother    Atrial fibrillation Mother    Hypertension Mother    Cancer Father    Emphysema Father     Vitals BP 104/65   Pulse 95   Temp 97.8 F (36.6 C) (Oral)   Resp 20   Ht '6\' 2"'$  (1.88 m)   Wt 53.2 kg   SpO2 100%   BMI 15.06 kg/m    Physical Exam Constitutional:  lying in the bed and appears toxic     Comments:   Cardiovascular:     Rate and Rhythm: Normal rate and regular rhythm.     Heart sounds:   Pulmonary:     Effort: Pulmonary effort is normal on room air     Comments: Normal breath sounds   Abdominal:     Palpations: Abdomen is soft.     Tenderness: non distended and non tender   Musculoskeletal/Neurology     General: Functional quadriplegia   Skin:    Comments:       Pertinent Microbiology Results for orders placed or performed during the hospital encounter of 07/19/21  Resp Panel by RT-PCR (Flu A&B, Covid) Urine, Catheterized     Status: None   Collection Time: 07/19/21  5:04 PM   Specimen: Urine, Catheterized; Nasal Swab  Result Value Ref Range Status   SARS Coronavirus 2 by RT PCR NEGATIVE NEGATIVE Final    Comment: (NOTE) SARS-CoV-2 target nucleic acids are NOT DETECTED.  The SARS-CoV-2 RNA is generally detectable in upper respiratory specimens during the acute phase of infection. The lowest concentration of SARS-CoV-2 viral copies this  assay can detect is 138 copies/mL. A negative result does not preclude SARS-Cov-2 infection and should not be used as the sole basis for treatment or other patient management decisions. A negative result may occur with  improper specimen collection/handling, submission of specimen other than nasopharyngeal swab, presence of viral mutation(s) within the areas targeted by this assay, and inadequate number of viral copies(<138 copies/mL). A negative result must be combined with clinical observations, patient history, and epidemiological information. The expected result is Negative.  Fact Sheet for Patients:  EntrepreneurPulse.com.au  Fact Sheet for Healthcare Providers:  IncredibleEmployment.be  This test is no t yet approved or cleared by the [place] FDA and  has been authorized for detection and/or diagnosis of SARS-CoV-2 by FDA under an Emergency Use Authorization (EUA). This EUA will remain  in effect (meaning this test can be used) for the duration of the COVID-19 declaration under Section 564(b)(1) of the Act, 21 U.S.C.section 360bbb-3(b)(1), unless the authorization is terminated  or revoked sooner.       Influenza A by PCR NEGATIVE NEGATIVE Final   Influenza B by PCR NEGATIVE NEGATIVE Final    Comment: (NOTE) The Xpert Xpress SARS-CoV-2/FLU/RSV plus assay is intended as an aid in the diagnosis of influenza from Nasopharyngeal swab specimens and should not be used as a sole basis for treatment. Nasal washings and aspirates are unacceptable for Xpert Xpress SARS-CoV-2/FLU/RSV testing.  Fact Sheet for Patients: EntrepreneurPulse.com.au  Fact Sheet for Healthcare Providers: IncredibleEmployment.be  This test is not yet approved or cleared by the [place] FDA and has been authorized for detection and/or diagnosis of SARS-CoV-2 by FDA under an Emergency Use Authorization (EUA). This  EUA will  remain in effect (meaning this test can be used) for the duration of the COVID-19 declaration under Section 564(b)(1) of the Act, 21 U.S.C. section 360bbb-3(b)(1), unless the authorization is terminated or revoked.  Performed at [facility], [place] [address], [place], [place] [address]   Culture, blood (Routine x 2)     Status: None (Preliminary result)   Collection Time: 07/19/21  5:42 PM   Specimen: BLOOD  Result Value Ref Range Status   Specimen Description   Final    BLOOD LEFT ANTECUBITAL Performed at [facility] [address] [place], [place] [address]    Special Requests   Final    BOTTLES DRAWN AEROBIC AND ANAEROBIC Blood Culture results may not be optimal due to an excessive volume of blood received in culture bottles Performed at [facility] [address] [place], [place] [address]    Culture  Setup Time   Final    GRAM NEGATIVE RODS ANAEROBIC BOTTLE ONLY Organism ID to follow    Culture   Final    NO GROWTH < 12 HOURS Performed at [facility], [place] [address], [place], [place] [address]    Report Status PENDING  Incomplete  Culture, blood (Routine x 2)     Status: None (Preliminary result)   Collection Time: 07/19/21  5:42 PM   Specimen: BLOOD  Result Value Ref Range Status   Specimen Description   Final    BLOOD RIGHT ANTECUBITAL Performed at [facility] [address], [place], [place] [address]    Special Requests   Final    BOTTLES DRAWN AEROBIC AND ANAEROBIC Blood Culture results may not be optimal due to an excessive volume of blood received in culture bottles Performed at [facility] [address], [place], [place] [address]    Culture  Setup Time   Final    GRAM NEGATIVE RODS ANAEROBIC BOTTLE ONLY CRITICAL VALUE NOTED.  VALUE IS CONSISTENT WITH PREVIOUSLY REPORTED AND CALLED VALUE.    Culture   Final    NO GROWTH < 12 HOURS Performed at  [facility], [place] [address], [place], [place] [address]    Report Status PENDING  Incomplete  MRSA Next Gen by PCR, Nasal     Status: Abnormal   Collection Time: 07/19/21 10:34 PM   Specimen: Nasal Mucosa; Nasal Swab  Result Value Ref Range Status   MRSA by PCR Next Gen DETECTED (A) NOT DETECTED Final    Comment: RESULT CALLED TO, READ BACK BY AND VERIFIED WITH: B,CLAPP AT 0122 ON 07/20/21 BY A,MOHAMED (NOTE) The GeneXpert MRSA Assay (FDA approved for NASAL specimens only), is one component of a comprehensive MRSA colonization surveillance program. It is not intended to diagnose MRSA infection nor to guide or monitor treatment for MRSA infections. Test performance is not FDA approved in patients less than 63 years old. Performed at [facility], [place] [address] [place],  [address]     Pertinent Lab seen by me:    Latest Ref Rng & Units 07/20/2021    3:03 AM 07/19/2021    3:57 PM 06/09/2021    3:54 AM  CBC  WBC 4.0 - 10.5 K/uL 8.1  12.2  10.2   Hemoglobin 13.0 - 17.0 g/dL 9.3  13.9  11.8   Hematocrit 39.0 - 52.0 % 30.4  45.0  36.4   Platelets 150 - 400 K/uL 181  301  430       Latest Ref Rng & Units 07/20/2021    3:03 AM 07/19/2021    7:21 PM 07/19/2021    3:57 PM  CMP  Glucose 70 - 99 mg/dL 70 - 99 mg/dL 72    71  63  76   BUN 6 - 20 mg/dL 6 - 20 mg/dL 93    91  112  118   Creatinine 0.61 - 1.24 mg/dL 0.61 - 1.24 mg/dL 1.82    1.97  2.43  2.87   Sodium 135 - 145 mmol/L 135 - 145 mmol/L 144    145  143  141   Potassium 3.5 - 5.1 mmol/L 3.5 - 5.1 mmol/L 4.3    4.3  5.0  5.6   Chloride 98 - 111 mmol/L 98 - 111 mmol/L 121    120  120  115   CO2 22 - 32 mmol/L 22 - 32 mmol/L '15    15  13  14   '$ Calcium 8.9 - 10.3 mg/dL 8.9 - 10.3 mg/dL 9.2    9.4  9.7  10.8   Total Protein 6.5 - 8.1 g/dL 6.5 - 8.1 g/dL 6.5 - 8.1 g/dL 5.4    5.3    5.2   8.0   Total Bilirubin 0.3 - 1.2 mg/dL 0.3 - 1.2 mg/dL 0.3 - 1.2 mg/dL 1.1    1.0    1.2    1.5   Alkaline Phos 38 - 126 U/L 38 - 126 U/L 38 - 126 U/L 44    42    42   64   AST 15 - 41 U/L 15 - 41 U/L 15 - 41 U/L '29    29    29   '$ 40   ALT 0 - 44 U/L 0 - 44 U/L 0 - 44 U/L '14    16    16   21     '$ Pertinent Imagings/Other Imagings Plain films and CT images have been personally visualized and interpreted; radiology reports have been reviewed. Decision making incorporated into the Impression / Recommendations.  US Abdomen Limited RUQ (LIVER/GB)  Result Date: 07/20/2021 CLINICAL DATA:  61 year old male with abdominal pain. Distended gallbladder. EXAM: ULTRASOUND ABDOMEN LIMITED RIGHT UPPER QUADRANT COMPARISON:  Noncontrast CT Abdomen and Pelvis 07/19/2021. FINDINGS: Gallbladder: Distended gallbladder with perhaps mild dependent sludge (image 4). But no gallstones identified. Wall thickness remains normal at 2 mm. No pericholecystic fluid. No sonographic Murphy sign elicited. Common bile duct: Diameter: 9-10 mm, dilated. No filling defect within the visible duct (image 20). Liver: No intrahepatic biliary ductal dilatation is evident. No discrete liver lesion. Background echogenicity within normal limits. Portal vein is patent on color Doppler imaging with normal direction of blood flow towards the liver. Other: Hydronephrotic right kidney redemonstrated (image 37). IMPRESSION: 1. Dilated CBD (11 mm) but no filling defect in the visible duct. No intrahepatic biliary ductal dilatation. If there is hyperbilirubinemia consider choledocholithiasis or other distal obstruction. 2. Distended gallbladder, perhaps with mild sludge, but no stones or evidence of acute cholecystitis. 3. Right hydronephrosis. Electronically Signed   By: Genevie Ann M.D.   On: 07/20/2021 06:23   CT ABDOMEN PELVIS WO CONTRAST  Result Date: 07/19/2021 CLINICAL DATA:  Acute abdominal pain. Sepsis. Hypotension and abnormal labs. EXAM: CT ABDOMEN AND PELVIS WITHOUT CONTRAST TECHNIQUE: Multidetector CT imaging of the abdomen  and pelvis was performed following the standard protocol without IV contrast. RADIATION DOSE REDUCTION: This exam was performed according  to the departmental dose-optimization program which includes automated exposure control, adjustment of the mA and/or kV according to patient size and/or use of iterative reconstruction technique. COMPARISON:  CT 12/16/2020 FINDINGS: Lower chest: Basilar bronchiolectasis. Hypoventilatory changes in the dependent lungs. No pleural effusion. Normal heart size with coronary artery calcifications. Hepatobiliary: No evidence of focal hepatic abnormality on this unenhanced exam. Gallbladder is distended. No calcified gallstone. No definite pericholecystic fat stranding, although detailed assessment limited by motion. Pancreas: Parenchymal atrophy. No ductal dilatation or inflammation. Spleen: Normal in size without focal abnormality. Adrenals/Urinary Tract: No adrenal nodule. Moderate to advanced bilateral hydroureteronephrosis, with progression from prior exam. Both ureters are dilated to the bladder insertion. No evidence of obstructing calculi. Mild urothelial thickening distally. Foley catheter in the bladder, bladder is mildly distended despite appropriate Foley catheter placement. Moderate wall thickening with mild perivesicular fat stranding. There are punctate nonobstructing intrarenal stones. Stomach/Bowel: Air-fluid level in the stomach which is moderately distended. No small bowel obstruction or inflammation. Normal appendix. Moderate volume of colonic stool. Vascular/Lymphatic: Mild aortic and branch atherosclerosis. No portal venous or mesenteric gas. No bulky abdominopelvic adenopathy Reproductive: Prostate is unremarkable. Other: Small fat containing left inguinal hernia. No free air or ascites. Musculoskeletal: Sacral decubitus ulcer with resorptive changes of the sacrum and coccyx. Right hip arthroplasty. The bones are markedly under mineralized. There is presacral soft  tissue thickening, no drainable abscess or collection. IMPRESSION: 1. Moderate to advanced bilateral hydroureteronephrosis, with progression from prior exam. No evidence of obstructing calculi. Bladder is mildly distended despite appropriate Foley catheter placement. Moderate bladder wall thickening with mild perivesicular fat stranding, suspicious for urinary tract infection. Recommend correlation with urinalysis. 2. Sacral decubitus ulcer with resorptive changes of the sacrum and coccyx. Progression from prior exam suggestive of osteomyelitis. Presacral soft tissue thickening without drainable abscess or collection. 3. Distended gallbladder without calcified gallstone. If there is clinical concern for acute cholecystitis, recommend right upper quadrant ultrasound. 4. Punctate nonobstructing intrarenal stones. 5. Moderate gastric distension with air-fluid level, nonspecific. Aortic Atherosclerosis (ICD10-I70.0). Electronically Signed   By: Keith Rake M.D.   On: 07/19/2021 20:52   DG Chest 2 View  Result Date: 07/19/2021 CLINICAL DATA:  Suspected Sepsis Hypotension.  Abnormal labs. EXAM: CHEST - 2 VIEW COMPARISON:  Chest radiograph 03/17/2021, chest CT 06/22/2014 FINDINGS: The heart is normal in size. Stable mediastinal contours. No acute airspace disease. No pulmonary edema, pleural effusion, or pneumothorax. Apical blebs and scarring again seen. IMPRESSION: No acute chest findings. Electronically Signed   By: Keith Rake M.D.   On: 07/19/2021 16:59    I spent more than 80 minutes for this patient encounter including review of prior medical records/discussing diagnostics and treatment plan with the patient/family/coordinate care with primary/other specialits with greater than 50% of time in face to face encounter.   Electronically signed by:   [name] Oz, MD Infectious Disease Physician [facility] for Infectious Disease Pager: [phone]

## 2021-07-20 NOTE — Progress Notes (Signed)
PHARMACY - PHYSICIAN COMMUNICATION CRITICAL VALUE ALERT - BLOOD CULTURE IDENTIFICATION (BCID)  ISIAC BREIGHNER is an 61 y.o. male who presented to [facility] on 07/19/2021 with a chief complaint of hypotension, fevers, chills  Assessment:  28 YOM with concern for urosepsis in the setting of indwelling foley. Now with 2 of 4 bottles growing GNR with BCID detecting Proteus species - no CTX-M ESBL noted.   Name of physician (or Provider) Contacted[place] (ID consult), ICU pharmacist to inform CCM team  Current antibiotics: Vancomycin + Cefepime  Changes to prescribed antibiotics recommended:  Narrow antibiotics to Rocephin 2g IV every 24 hours  Results for orders placed or performed during the hospital encounter of 07/19/21  Blood Culture ID Panel (Reflexed) (Collected: 07/19/2021  5:42 PM)  Result Value Ref Range   Enterococcus faecalis NOT DETECTED NOT DETECTED   Enterococcus Faecium NOT DETECTED NOT DETECTED   Listeria monocytogenes NOT DETECTED NOT DETECTED   Staphylococcus species NOT DETECTED NOT DETECTED   Staphylococcus aureus (BCID) NOT DETECTED NOT DETECTED   Staphylococcus epidermidis NOT DETECTED NOT DETECTED   Staphylococcus lugdunensis NOT DETECTED NOT DETECTED   Streptococcus species NOT DETECTED NOT DETECTED   Streptococcus agalactiae NOT DETECTED NOT DETECTED   Streptococcus pneumoniae NOT DETECTED NOT DETECTED   Streptococcus pyogenes NOT DETECTED NOT DETECTED   A.calcoaceticus-baumannii NOT DETECTED NOT DETECTED   Bacteroides fragilis NOT DETECTED NOT DETECTED   Enterobacterales DETECTED (A) NOT DETECTED   Enterobacter cloacae complex NOT DETECTED NOT DETECTED   Escherichia coli NOT DETECTED NOT DETECTED   Klebsiella aerogenes NOT DETECTED NOT DETECTED   Klebsiella oxytoca NOT DETECTED NOT DETECTED   Klebsiella pneumoniae NOT DETECTED NOT DETECTED   Proteus species DETECTED (A) NOT DETECTED   Salmonella species NOT DETECTED NOT DETECTED   Serratia marcescens  NOT DETECTED NOT DETECTED   Haemophilus influenzae NOT DETECTED NOT DETECTED   Neisseria meningitidis NOT DETECTED NOT DETECTED   Pseudomonas aeruginosa NOT DETECTED NOT DETECTED   Stenotrophomonas maltophilia NOT DETECTED NOT DETECTED   Candida albicans NOT DETECTED NOT DETECTED   Candida auris NOT DETECTED NOT DETECTED   Candida glabrata NOT DETECTED NOT DETECTED   Candida krusei NOT DETECTED NOT DETECTED   Candida parapsilosis NOT DETECTED NOT DETECTED   Candida tropicalis NOT DETECTED NOT DETECTED   Cryptococcus neoformans/gattii NOT DETECTED NOT DETECTED   CTX-M ESBL NOT DETECTED NOT DETECTED   Carbapenem resistance IMP NOT DETECTED NOT DETECTED   Carbapenem resistance KPC NOT DETECTED NOT DETECTED   Carbapenem resistance NDM NOT DETECTED NOT DETECTED   Carbapenem resist OXA 48 LIKE NOT DETECTED NOT DETECTED   Carbapenem resistance VIM NOT DETECTED NOT DETECTED   Thank you for allowing pharmacy to be a part of this patient's care.  Alycia Rossetti, PharmD, BCPS Infectious Diseases Clinical Pharmacist 07/20/2021 9:55 AM   **Pharmacist phone directory can now be found on [email or web address] (PW [email or web address]).  Listed under [place].

## 2021-07-20 NOTE — Assessment & Plan Note (Signed)
Could be source of sepsis continue cefepime and vancomycin appreciate ID consult

## 2021-07-20 NOTE — Progress Notes (Signed)
Patient started on pressors overnight. Discussed with Dr Ander Slade, they will assume patient's  care. Please call triad when out of ICU. Patient not seem.

## 2021-07-20 NOTE — Progress Notes (Signed)
[place] Progress Note Patient Name: [name] [name] DOB: July 27, 1960 MRN: [id number]   Date of Service  07/20/2021  HPI/Events of Note  Hypotension - BP = 70/51 with MAP = 54. No central venous line. LVEF = 55-60%. Lactic Acid = 5.0 --> 3.6.  eICU Interventions  Plan: Bolus with 0.9 NaCl 1 liter IV over 1 hour now.  If BP does not respond to fluid challenge, will require Norepinephrine IV infusion via PIV.     Intervention Category Major Interventions: Hypotension - evaluation and management  Nichlos Kunzler [place] 07/20/2021, 2:15 AM

## 2021-07-20 NOTE — Evaluation (Signed)
Clinical/Bedside Swallow Evaluation Patient Details  Name: [name] [name] MRN: [id number] Date of Birth: April 03, 1960  Today's Date: 07/20/2021 Time: SLP Start Time (ACUTE ONLY): 57 SLP Stop Time (ACUTE ONLY): 1301 SLP Time Calculation (min) (ACUTE ONLY): 31 min  Past Medical History:  Past Medical History:  Diagnosis Date   ABLA (acute blood loss anemia) 93/71/6967   Acute metabolic encephalopathy 89/03/8099   Arthritis    Decubitus ulcer of sacral area    DVT (deep venous thrombosis) ([place]) 2008   Left leg   Exposure to hepatitis B    H/O hypotension    History of anemia    History of gout    History of transfusion    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    HIV (human immunodeficiency virus infection) ([place])    Hx of sepsis 2015   affected rt hip / femur   Pain in limb 07/23/2013   Pain in limb 07/23/2013   PONV (postoperative nausea and vomiting)    Hiccups for 4 days after anesthesia   Radiation    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   Severe sepsis; Candidia albicans bacteremia ([place]) 05/13/2020   Shingles    left shoulder, right leg   Thrombocytopenia ([place]) 12/21/2020   Past Surgical History:  Past Surgical History:  Procedure Laterality Date   COLONOSCOPY     CONVERSION TO TOTAL HIP Right 06/15/2014   Procedure: REMOVAL OF ANTIBIOTIC  SPACER AND CONVERSION TO RIGHT TOTAL HIP  ARTHROPLASTY ;  Surgeon: Paralee Cancel, MD;  Location: [facility];  Service: Orthopedics;  Laterality: Right;   CYSTOSCOPY     FLEXIBLE SIGMOIDOSCOPY N/A 12/17/2020   Procedure: FLEXIBLE SIGMOIDOSCOPY;  Surgeon: Gatha Mayer, MD;  Location: [facility];  Service: Endoscopy;  Laterality: N/A;   HEMATOMA EVACUATION     HERNIA REPAIR     right X2   INCISION AND DRAINAGE HIP Right 07/31/2013   Procedure: IRRIGATION AND DEBRIDEMENT HIP WITH PERCUTANEOUS DRAIN PLACEMENT;  Surgeon: [name], MD;  Location: [place];  Service: Orthopedics;  Laterality: Right;   INCISION AND DRAINAGE HIP  Right 03/15/2015   Procedure: IRRIGATION AND DEBRIDEMENT RIGHT HIP WITH  HEAD BALL  AND POLY LINER EXCHANGE;  Surgeon: Paralee Cancel, MD;  Location: [facility];  Service: Orthopedics;  Laterality: Right;   INCISION AND DRAINAGE OF WOUND N/A 08/25/2013   Procedure: IRRIGATION AND DEBRIDEMENT OF SACRAL ULCER WITH PLACEMENT OF A CELL ;  Surgeon: Theodoro Kos, DO;  Location: [place];  Service: Plastics;  Laterality: N/A;   INCISION AND DRAINAGE OF WOUND N/A 09/01/2013   Procedure: IRRIGATION AND DEBRIDEMENT SACRAL ULCER WITH PLACEMENT OF A CELL;  Surgeon: Theodoro Kos, DO;  Location: [place];  Service: Plastics;  Laterality: N/A;   INCISION AND DRAINAGE OF WOUND N/A 09/08/2013   Procedure: IRRIGATION AND DEBRIDEMENT OF SACRAL ULCER WITH PLACEMENT OF A CELL AND VAC;  Surgeon: Theodoro Kos, DO;  Location: [place];  Service: Plastics;  Laterality: N/A;   INCISION AND DRAINAGE OF WOUND N/A 09/18/2013   Procedure: IRRIGATION AND DEBRIDEMENT OF SACRAL WOUND WITH PLACEMENT OF A-CELL;  Surgeon: Theodoro Kos, DO;  Location: [place];  Service: Plastics;  Laterality: N/A;   PILONIDAL CYST DRAINAGE N/A 08/09/2013   Procedure: IRRIGATION AND DEBRIDEMENT SACRAL DECUBITUS;  Surgeon: Harl Bowie, MD;  Location: [place];  Service: General;  Laterality: N/A;   RECTAL BIOPSY N/A 03/29/2020   Procedure: EXCISION OF PERIANAL TISSUE  WITH BX AND 3 PUNCH BXS;  Surgeon: Ileana Roup, MD;  [facility];  Service: General;  Laterality: N/A;   TEE WITHOUT CARDIOVERSION N/A 03/22/2021   Procedure: TRANSESOPHAGEAL ECHOCARDIOGRAM (TEE);  Surgeon: Buford Dresser, MD;  Location: [facility];  Service: Cardiovascular;  Laterality: N/A;   TOTAL HIP ARTHROPLASTY Right 08/04/2013   Procedure: RESECTION HIP JOINT - PLACEMENT OF CEMENT PROSTHESIS;  Surgeon: [name], MD;  Location: [place];  Service: Orthopedics;  Laterality: Right;   TRANSURETHRAL RESECTION OF PROSTATE  JAN 2016   HPI:  [name] [name] is a 61 year old gentleman  with a history of HIV, previous endocarditis, previous osteomyelitis, baseline debility being bedbound for the past 1 to 2 years, decubitus ulcer, urinary retention requiring suprapubic catheter, recurrent urinary tract infections who presented after feeling poorly for the past 2 days.  He has had worse back pain and muscle spasms in his baseline, fevers, chills, hiccups, abdominal pain, nausea, vomiting.  His oral intake is increased recently since being started on Megace by his infectious disease physician, but over the last 2 days he has not been eating and drinking normally.  Pt also with h/o falls after ETOH consumption, post-fall CVA 2022, rectal ca s/p XRT and  CT neck Prominent spondylosis at C5-6 and C6-7. Mild diffuse facet hypertrophy..  Swallow eval ordered. Pt admits to dysphagia - pointing to proximal esophagus..    Assessment / Plan / Recommendation  Clinical Impression  Patient without focal CN deficits of items he would complete.  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  He was willing to consume po intake including thin water, applejuice, applesauce and graham cracker.  Delayed swallow noted presumed to be due to cognition.  No clinical indications across all po intake but pt did not pass the 3 ounce [facility] swallow screen due to requiring rest break.  Frequent eructation noted with sliding pt up in bed and after po intake - most notably liquids. Pt denies refluxing - but does admit to erucation for one month (observed during BSE 2022).  Throat clearing noted after liquid intake x3 of 7 boluses - also observed during prior BSE.  Recommend to initiate clear liquid diet with strict precautions. Will follow up for dietary advancement, indication for instrumental swallow evaluation.  Advised medicine with applesauce - start and follow with liquids for safety. SLP Visit Diagnosis: Dysphagia, oropharyngeal phase (R13.12)    Aspiration Risk  Moderate aspiration risk;Risk for  inadequate nutrition/hydration    Diet Recommendation Other (Comment) (clears)   Liquid Administration via: Cup;Straw Medication Administration: Whole meds with puree Supervision: Patient able to self feed Compensations: Slow rate;Small sips/bites Postural Changes: Seated upright at 90 degrees;Remain upright for at least 30 minutes after po intake    Other  Recommendations Oral Care Recommendations: Staff/trained caregiver to provide oral care;Oral care prior to ice chip/H20;Patient independent with oral care Other Recommendations: Have oral suction available;Clarify dietary restrictions    Recommendations for follow up therapy are one component of a multi-disciplinary discharge planning process, led by the attending physician.  Recommendations may be updated based on patient status, additional functional criteria and insurance authorization.  Follow up Recommendations Other (comment)      Assistance Recommended at Discharge Frequent or constant Supervision/Assistance  Functional Status Assessment Patient has had a recent decline in their functional status and demonstrates the ability to make significant improvements in function in a reasonable and predictable amount of time.  Frequency and Duration min 2x/week  1  week       Prognosis Prognosis for Safe Diet Advancement: Fair Barriers to Reach Goals: Severity of deficits;Time post onset      Swallow Study   General Date of Onset: 07/20/21 HPI: [name] [name] is a 61 year old gentleman with a history of HIV, previous endocarditis, previous osteomyelitis, baseline debility being bedbound for the past 1 to 2 years, decubitus ulcer, urinary retention requiring suprapubic catheter, recurrent urinary tract infections who presented after feeling poorly for the past 2 days.  He has had worse back pain and muscle spasms in his baseline, fevers, chills, hiccups, abdominal pain, nausea, vomiting.  His oral intake is increased recently since being  started on Megace by his infectious disease physician, but over the last 2 days he has not been eating and drinking normally.  Pt also with h/o falls after ETOH consumption, post-fall CVA 2022, rectal ca s/p XRT and  CT neck Prominent spondylosis at C5-6 and C6-7. Mild diffuse facet hypertrophy..  Swallow eval ordered. Pt admits to dysphagia - pointing to proximal esophagus.. Type of Study: Bedside Swallow Evaluation Diet Prior to this Study: NPO Temperature Spikes Noted: No Respiratory Status: Room air History of Recent Intubation: No Behavior/Cognition: Alert;Cooperative;Pleasant mood Oral Cavity Assessment: Within Functional Limits;Other (comment) (faucial pillar secretions left sided- dark colored) Oral Care Completed by SLP: Yes Oral Cavity - Dentition: Adequate natural dentition;Other (Comment) (missing some dentition - does not have partial) Self-Feeding Abilities: Other (Comment) (unable to self feed due to functional quadriparesis) Patient Positioning: Upright in bed Baseline Vocal Quality: Low vocal intensity Volitional Cough: Strong Volitional Swallow: Able to elicit    Oral/Motor/Sensory Function Overall Oral Motor/Sensory Function: Generalized oral weakness   Ice Chips Ice chips: Within functional limits Presentation: Spoon   Thin Liquid Thin Liquid: Impaired Presentation: Self Fed;Straw;Spoon Oral Phase Impairments: Reduced lingual movement/coordination;Poor awareness of bolus;Reduced labial seal Oral Phase Functional Implications: Other (comment) Pharyngeal  Phase Impairments: Throat Clearing - Immediate;Throat Clearing - Delayed    Nectar Thick Nectar Thick Liquid: Not tested   Honey Thick Honey Thick Liquid: Not tested   Puree Puree: Impaired Presentation: Self Fed;Spoon Oral Phase Impairments: Reduced labial seal;Poor awareness of bolus Oral Phase Functional Implications: Prolonged oral transit Pharyngeal Phase Impairments: Suspected delayed Swallow   Solid      Solid: Impaired Presentation: Self Fed;Spoon Oral Phase Impairments: Poor awareness of bolus;Impaired mastication Oral Phase Functional Implications: Impaired mastication Pharyngeal Phase Impairments: Suspected delayed Swallow      Macario Golds 07/20/2021,1:52 PM  Kathleen Lime, MS [facility] SLP Acute Rehab Services Office [phone] Pager [phone]

## 2021-07-20 NOTE — Progress Notes (Signed)
OT Cancellation Note  Patient Details Name: [name] [name] MRN: [id number] DOB: [birth year]   Cancelled Treatment:    Reason Eval/Treat Not Completed: OT screened, no needs identified, will sign off Patient is a bed bound long term care resident at SNF per chart and past admissions. No acute OT needs identified at this time. OT signing off. Thank you for this referral.  Jackelyn Poling OTR/L, [place] Acute Rehabilitation Department Office# [phone] Pager# [phone]  07/20/2021, 8:14 AM

## 2021-07-20 NOTE — Progress Notes (Signed)
Initial Nutrition Assessment  DOCUMENTATION CODES:   Severe malnutrition in context of chronic illness, Underweight  INTERVENTION:  - continue Ensure Plus High Protein TID, each supplement provides 350 kcal and 20 grams of protein.  - will order 1 packet Juven BID, each packet provides 95 calories, 2.5 grams of protein (collagen), and 9.8 grams of carbohydrate (3 grams sugar); also contains 7 grams of L-arginine and L-glutamine, 300 mg vitamin C, 15 mg vitamin E, 1.2 mcg vitamin B-12, 9.5 mg zinc, 200 mg calcium, and 1.5 g  Calcium Beta-hydroxy-Beta-methylbutyrate to support wound healing.  - will order 500 mg ascorbic acid BID.  - will order 220 mg zinc sulfate/day x14 days.  - diet  advancement as medically feasible.    NUTRITION DIAGNOSIS:   Severe Malnutrition related to chronic illness (HIV) as evidenced by severe fat depletion, severe muscle depletion.  GOAL:   Patient will meet greater than or equal to 90% of their needs  MONITOR:   PO intake, Supplement acceptance, Diet advancement, Labs, Weight trends, Skin  REASON FOR ASSESSMENT:   Malnutrition Screening Tool, Consult Assessment of nutrition requirement/status  ASSESSMENT:   61 year old male with medical history HIV, previous endocarditis, previous osteomyelitis, baseline debility being bedbound for the past 1-2 years, decubitus ulcers, urinary retention requiring suprapubic catheter, and recurrent UTIs. He presented to the ED due to 2 days of feeling unwell. He reported worsening of back pain, muscle spasms, fevers, chills, hiccups, abdominal pain, and N/V. He was recently started on megace which was helpful. He was admitted due to HIV.  Patient sitting up in bed with clear liquids meal tray in front of him and untouched. Patient declined offer for RD to open anything. He was noted to have tremors in both of his hands which he shares is normal for him.   Patient reports that appetite waxes and wanes and he confirms  that appetite stimulant had seemed to help but he was unable to provide any other information on this.  Patient drinks chocolate protein shakes (such as Ensure) at home and shares that he drinks "as many as I can" when asked about an average/day. Patient very interested in having oral nutrition supplements during admission.  Weight yesterday was 117 lb and weight on 04/18/21 was 120 lb. This indicates 3 lb weight loss (2.5% body weight) in the past 3 months; not significant for time frame.    Labs reviewed; CBGs: 122, 122, 139, 177, 198 mg/dl, BUN: 75 mg/dl, creatinine: 1.66 mg/dl, GFR: 47 ml/min.  Medications reviewed; 20 mg pepcid/day, 100 mg IV thiamine/day.  IVF; D5-LR @ 125 ml/hr (510 kcal/24 hrs).    NUTRITION - FOCUSED PHYSICAL EXAM:  Flowsheet Row Most Recent Value  Orbital Region Severe depletion  Upper Arm Region Severe depletion  Thoracic and Lumbar Region Unable to assess  Buccal Region Severe depletion  Temple Region Severe depletion  Clavicle Bone Region Severe depletion  Clavicle and Acromion Bone Region Severe depletion  Scapular Bone Region Severe depletion  Dorsal Hand Severe depletion  Patellar Region Moderate depletion  Anterior Thigh Region Moderate depletion  Posterior Calf Region Moderate depletion  Edema (RD Assessment) Mild  [BLE]  Hair Reviewed  Eyes Reviewed  Mouth Reviewed  [tongue slightly darkened]  Skin Reviewed  Nails Reviewed       Diet Order:   Diet Order             Diet clear liquid Room service appropriate? Yes; Fluid consistency: Thin  Diet effective now  EDUCATION NEEDS:   Education needs have been addressed  Skin:  Skin Assessment: Skin Integrity Issues: Skin Integrity Issues:: Stage I, Stage II, Stage III, Unstageable Stage I: sacrum Stage II: R foot Stage III: sacrum x2 Unstageable: full thickness to L heel  Last BM:  PTA/unknown  Height:   Ht Readings from Last 1 Encounters:  07/19/21 '6\' 2"'$   (1.88 m)    Weight:   Wt Readings from Last 1 Encounters:  07/19/21 53.2 kg     BMI:  Body mass index is 15.06 kg/m.  Estimated Nutritional Needs:  Kcal:  2200-2500 kcal Protein:  110-125 grams Fluid:  >/= 2.5 L/day      Jarome Matin, MS, RD, LDN, CNSC Registered Dietitian II Inpatient Clinical Nutrition RD pager # and on-call/weekend pager # available in [facility]

## 2021-07-21 DIAGNOSIS — A419 Sepsis, unspecified organism: Secondary | ICD-10-CM | POA: Diagnosis not present

## 2021-07-21 DIAGNOSIS — R778 Other specified abnormalities of plasma proteins: Secondary | ICD-10-CM

## 2021-07-21 DIAGNOSIS — R6520 Severe sepsis without septic shock: Secondary | ICD-10-CM | POA: Diagnosis not present

## 2021-07-21 LAB — GLUCOSE, CAPILLARY
Glucose-Capillary: 183 mg/dL — ABNORMAL HIGH (ref 70–99)
Glucose-Capillary: 128 mg/dL — ABNORMAL HIGH (ref 70–99)
Glucose-Capillary: 118 mg/dL — ABNORMAL HIGH (ref 70–99)
Glucose-Capillary: 121 mg/dL — ABNORMAL HIGH (ref 70–99)
Glucose-Capillary: 153 mg/dL — ABNORMAL HIGH (ref 70–99)
Glucose-Capillary: 111 mg/dL — ABNORMAL HIGH (ref 70–99)

## 2021-07-21 LAB — CBC
HCT: 32.7 % — ABNORMAL LOW (ref 39.0–52.0)
Hemoglobin: 10.3 g/dL — ABNORMAL LOW (ref 13.0–17.0)
MCH: 31.5 pg (ref 26.0–34.0)
MCHC: 31.5 g/dL (ref 30.0–36.0)
MCV: 100 fL (ref 80.0–100.0)
Platelets: 146 10*3/uL — ABNORMAL LOW (ref 150–400)
RBC: 3.27 MIL/uL — ABNORMAL LOW (ref 4.22–5.81)
RDW: 20.5 % — ABNORMAL HIGH (ref 11.5–15.5)
WBC: 7.9 10*3/uL (ref 4.0–10.5)
nRBC: 0 % (ref 0.0–0.2)

## 2021-07-21 LAB — HEPARIN LEVEL (UNFRACTIONATED): Heparin Unfractionated: 0.19 IU/mL — ABNORMAL LOW (ref 0.30–0.70)

## 2021-07-21 LAB — URINE CULTURE: Culture: >=100000 — AB

## 2021-07-21 LAB — CREATININE, SERUM
Creatinine, Ser: 1.47 mg/dL — ABNORMAL HIGH (ref 0.61–1.24)
GFR, Estimated: 55 mL/min — ABNORMAL LOW (ref >60)

## 2021-07-21 LAB — TROPONIN I (HIGH SENSITIVITY)
Troponin I (High Sensitivity): 634 ng/L (ref <18)
Troponin I (High Sensitivity): 249 ng/L (ref <18)

## 2021-07-21 MED ORDER — MAGNESIUM SULFATE 2 GM/50ML IV SOLN
2.0000 g | Freq: Once | INTRAVENOUS | Status: AC
Start: 2021-07-21 — End: 2021-07-21
  Administered 2021-07-21: 2 g via INTRAVENOUS
  Filled 2021-07-21: qty 50

## 2021-07-21 MED ORDER — HEPARIN BOLUS VIA INFUSION
3000.0000 [IU] | Freq: Once | INTRAVENOUS | Status: AC
Start: 2021-07-21 — End: 2021-07-21
  Administered 2021-07-21: 3000 [IU] via INTRAVENOUS
  Filled 2021-07-21: qty 3000

## 2021-07-21 MED ORDER — NYSTATIN 100000 UNIT/ML MT SUSP
5.0000 mL | Freq: Four times a day (QID) | OROMUCOSAL | Status: DC
  Administered 2021-07-21 – 2021-07-22 (×6): 500000 [IU] via ORAL
  Filled 2021-07-21 (×6): qty 5

## 2021-07-21 MED ORDER — ASPIRIN 81 MG PO CHEW
81.0000 mg | CHEWABLE_TABLET | Freq: Every day | ORAL | Status: DC
  Administered 2021-07-22: 81 mg via ORAL
  Filled 2021-07-21 (×2): qty 1

## 2021-07-21 MED ORDER — GERHARDT'S BUTT CREAM
TOPICAL_CREAM | Freq: Two times a day (BID) | CUTANEOUS | Status: DC
  Administered 2021-07-21: 1 via TOPICAL
  Filled 2021-07-21: qty 1

## 2021-07-21 MED ORDER — HEPARIN (PORCINE) 25000 UT/250ML-% IV SOLN
800.0000 [IU]/h | INTRAVENOUS | Status: DC
  Administered 2021-07-21: 650 [IU]/h via INTRAVENOUS
  Filled 2021-07-21: qty 250

## 2021-07-21 NOTE — Progress Notes (Signed)
[facility] Progress Note Patient Name: [name] [name] DOB: Mar 31, 1960 MRN: [id number]   Date of Service  07/21/2021  HPI/Events of Note  Troponin = 448 --> 634. Patient is already on Rx with ASA and Heparin IV infusion. Can't B-Block d/t Norepinephrine IV infusion requirement.  eICU Interventions  Continue present management.     Intervention Category Major Interventions: Other:  [name] [name] 07/21/2021, 3:10 AM

## 2021-07-21 NOTE — Discharge Instructions (Signed)
From Urology - Recommend that the catheter be changed every three weeks.  Will need to follow-up with Dr. Gloriann Loan of Urology within the next several months.

## 2021-07-21 NOTE — Progress Notes (Signed)
   Asked to weigh in on [name] [name], 61 year old with a history of HIV, endocarditis, osteomyelitis and debility with bedbound status, presented with recurrent urinary tract infections related to suprapubic catheter.  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  He has been on prolonged antifungal treatment and has not been thought to be a surgical candidate given his frailty and was evaluated by Dr. Tamala Julian in April 2023.  Prognosis was thought to be poor.  He was not considered a surgical candidate.  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  ID recommended medical therapy for this.  Now presents with elevated troponins at 448 and 634 without any chest pain complaints.  Findings likely demand ischemia versus infection.  He is not a candidate for cardiac catheterization or revascularization.  Given lack of chest pain would discontinue IV heparin if this is the indication.  Repeat echo is still pending.  Would benefit from further goals of care discussion by palliative medicine.  He is DNR.  [facility] will sign off.   Medication Recommendations:  none Other recommendations (labs, testing, etc):  none Follow up as an outpatient:  PRN  Pixie Casino, MD, [facility], [place] Director of the Advanced Lipid Disorders &  Cardiovascular Risk Reduction Clinic Diplomate of the American Board of Clinical Lipidology Attending Cardiologist  Direct Dial: [phone]  Fax: [phone]  Website:  [email or web address]

## 2021-07-21 NOTE — Progress Notes (Signed)
[place] Progress Note Patient Name: ZYMIER RODGERS DOB: 15-Oct-1960 MRN: [id number]   Date of Service  07/21/2021  HPI/Events of Note  Multiple issues: 1. Troponin = 448. Can't B-Block d/t Norepinephrine IV infusion. 2. Hypomagnesemia - Mg++ = 1.7 and Creatinine = 1.67.   eICU Interventions  Plan: ASA 81 mg PO now and Q day. Heparin IV infusion per pharmacy consult.  Replace Mg++.      Intervention Category Major Interventions: Other:  Lysle Dingwall 07/21/2021, 12:02 AM

## 2021-07-21 NOTE — Progress Notes (Signed)
ANTICOAGULATION CONSULT NOTE - Initial Consult  Pharmacy Consult for heparin Indication: ACS/STEMI  Allergies  Allergen Reactions   Collagen Rash and Other (See Comments)    Redness and "Allergic," per [facility]    Patient Measurements: Height: '6\' 2"'$  (188 cm) Weight: 53.2 kg (117 lb 4.6 oz) IBW/kg (Calculated) : 82.2 Heparin Dosing Weight: 53.2kg  Vital Signs: Temp: 98 F (36.7 C) (07/13 0800) Temp Source: Oral (07/13 0800) BP: 105/70 (07/13 0900) Pulse Rate: 88 (07/13 0700)  Labs: Recent Labs    07/19/21 1557 07/19/21 1921 07/20/21 0303 07/20/21 0827 07/20/21 1318 07/20/21 2000 07/20/21 2247 07/21/21 0123 07/21/21 0737  HGB 13.9  --  9.3*  --   --   --   --  10.3*  --   HCT 45.0  --  30.4*  --   --   --   --  32.7*  --   PLT 301  --  181  --   --   --   --  146*  --   APTT  --  22*  --   --   --   --   --   --   --   LABPROT 17.8*  --   --   --   --   --   --   --   --   INR 1.5*  --   --   --   --   --   --   --   --   HEPARINUNFRC  --   --   --   --   --   --   --   --  0.19*  CREATININE 2.87* 2.43* 1.97*  1.82*   < > 1.66* 1.67*  --  1.47*  --   CKTOTAL  --  401*  --   --   --   --   --   --   --   TROPONINIHS  --   --   --   --   --   --  448* 634*  --    < > = values in this interval not displayed.     Estimated Creatinine Clearance: 40.7 mL/min (A) (by C-G formula based on SCr of 1.47 mg/dL (H)).   Medical History: Past Medical History:  Diagnosis Date   ABLA (acute blood loss anemia) 32/44/0102   Acute metabolic encephalopathy 72/05/3662   Arthritis    Decubitus ulcer of sacral area    DVT (deep venous thrombosis) ([place]) 2008   Left leg   Exposure to hepatitis B    H/O hypotension    History of anemia    History of gout    History of transfusion    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    HIV (human immunodeficiency virus infection) ([place])    Hx of sepsis 2015   affected rt hip / femur   Pain in limb 07/23/2013   Pain in limb  07/23/2013   PONV (postoperative nausea and vomiting)    Hiccups for 4 days after anesthesia   Radiation    Rectal cancer (HCC)    Squamous cell   Severe sepsis; Candidia albicans bacteremia ([place]) 05/13/2020   Shingles    left shoulder, right leg   Thrombocytopenia ([place]) 12/21/2020     Assessment: 61 year old gentleman with a history of HIV, previous endocarditis, previous osteomyelitis, baseline debility being bedbound for the past 1 to 2 years, decubitus ulcer, urinary retention requiring  suprapubic catheter, recurrent urinary tract infections who presented after feeling poorly.  Pharmacy consulted to dose heparin drip for ACS/STEMI   Today, 07/21/21  Hgb 10.3 trending up, Plts a bit low at 146 , troponins trending up  HL is 0.19, subtherapeutic  No line or bleeding issues per RN    Goal of Therapy:  Heparin level 0.3-0.7 units/ml Monitor platelets by anticoagulation protocol: Yes   Plan:  Increase heparin drip to 8000 units/hr Obtain Heparin level in 6 hours after rate change  Daily CBC Monitor for signs and symptoms of bleeding     Royetta Asal, PharmD, BCPS 07/21/2021 9:28 AM

## 2021-07-21 NOTE — Progress Notes (Signed)
[facility] Infectious Diseases Follow Up Note  Patient Identification: Patient Name: [name] [name] MRN: [id number] [place] Date: 07/19/2021  3:39 PM Age: 61 y.o.Today's Date: 07/21/2021  Reason for Visit: GNR bacteremia and septic shock   Active Problems:   Human immunodeficiency virus (HIV) disease ([place])   Unspecified severe protein-calorie malnutrition ([place])   Protein-calorie malnutrition, severe ([place])   Acute renal failure superimposed on stage 3a chronic kidney disease (HCC)   Chronic diastolic CHF (congestive heart failure) (HCC)   Normochromic normocytic anemia   Pressure injury of skin   Acute urinary retention   Candidal endocarditis   Endocarditis of mitral valve   Osteomyelitis (HCC)   Hypoglycemia   Abnormal CT of the abdomen   Severe sepsis (HCC)  Antibiotics:  Vancomycin 7/11 Cefepime 7/11 Ceftriaxone 7/12-c Voricanazole PTA   Lines/Hardware: RT THA   Interval Events: afebrile, no leukocytosis. Off pressors    Assessment # Proteus mirabilis bacteremia secondary to obstructed Foley's catheter/UTI -shock has resolved, US kidneys 7/12 With resolution of hydronephrosis bilaterally.  No perinephric fluid collection.   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 PCCM phone consulted surgery and no surgical intervention planned. Recommended wound care    # left plantar foot ulcer ( necrotic) and Rt lateral foot wound: No evidence of abscess and OM in left ankle MRI 07/20/21   # HIV -followed by [name], on Prezcobix/Endurant/Tivicay. Acute drop in cd4  likely reactive in the setting of acute infection   # Dilated CBD in [place] - LFTs WNL. No intervention per Surgery/GI  Recommendations Continue ceftriaxone as is Follow up sensi of Proteus from blood cultures  Monitor CBC and BMP Following   Rest of the management as per the primary team. Thank you for the consult. Please page with pertinent questions or concerns. ______________________________________________________________________ Subjective patient seen and examined at the bedside.  Nausea Feels better than yesterday  Vitals BP 105/70   Pulse 88   Temp 98 F (36.7 C) (Oral)   Resp 15   Ht '6\' 2"'$  (1.88 m)   Wt 53.2 kg   SpO2 100%   BMI 15.06 kg/m     Physical Exam Constitutional:      Comments: sitting up in the bed and not in acute distress   Cardiovascular:     Rate and Rhythm: Normal rate and regular rhythm.     Heart sounds: No murmur heard.   Pulmonary:     Effort: Pulmonary effort is normal on RA    Comments:   Abdominal:     Palpations: Abdomen is soft.     Tenderness: non distended and non tender   Musculoskeletal:        General: No swelling or tenderness.   Skin:    Comments: stage 4 sacral ulcer, unstageable PI to left ischium, full thickness necrotic wounds in the left plantar foot and partial thickness wound at the rt lateral foot  Neurological:     General: Functional quadriplegia   Psychiatric:        Mood and Affect: Mood normal.   Pertinent Microbiology Results for orders placed or performed during the hospital encounter of 07/19/21  Urine Culture  Status: Abnormal (Preliminary result)   Collection Time: 07/19/21  1:56 AM   Specimen: Urine, Clean Catch  Result Value Ref Range Status   Specimen Description   Final    URINE, CLEAN CATCH Performed at [facility], [place] [address] [place], [place] [address]    Special Requests   Final    NONE Performed at [facility], [place] [address],  [place], [place] [address]    Culture (A)  Final    >=100,000 COLONIES/mL GRAM NEGATIVE RODS SUSCEPTIBILITIES TO FOLLOW Performed at [facility], [place] [address] [place], [place] [address]    Report Status PENDING  Incomplete  Resp Panel by RT-PCR (Flu A&B, Covid) Urine, Catheterized     Status: None   Collection Time: 07/19/21  5:04 PM   Specimen: Urine, Catheterized; Nasal Swab  Result Value Ref Range Status   SARS Coronavirus 2 by RT PCR NEGATIVE NEGATIVE Final    Comment: (NOTE) SARS-CoV-2 target nucleic acids are NOT DETECTED.  The SARS-CoV-2 RNA is generally detectable in upper respiratory specimens during the acute phase of infection. The lowest concentration of SARS-CoV-2 viral copies this assay can detect is 138 copies/mL. A negative result does not preclude SARS-Cov-2 infection and should not be used as the sole basis for treatment or other patient management decisions. A negative result may occur with  improper specimen collection/handling, submission of specimen other than nasopharyngeal swab, presence of viral mutation(s) within the areas targeted by this assay, and inadequate number of viral copies(<138 copies/mL). A negative result must be combined with clinical observations, patient history, and epidemiological information. The expected result is Negative.  Fact Sheet for Patients:  EntrepreneurPulse.com.au  Fact Sheet for Healthcare Providers:  IncredibleEmployment.be  This test is no t yet approved or cleared by the [place] FDA and  has been authorized for detection and/or diagnosis of SARS-CoV-2 by FDA under an Emergency Use Authorization (EUA). This EUA will remain  in effect (meaning this test can be used) for the duration of the COVID-19 declaration under Section 564(b)(1) of the Act, 21 U.S.C.section 360bbb-3(b)(1), unless the authorization is terminated  or revoked sooner.       Influenza A by PCR NEGATIVE  NEGATIVE Final   Influenza B by PCR NEGATIVE NEGATIVE Final    Comment: (NOTE) The Xpert Xpress SARS-CoV-2/FLU/RSV plus assay is intended as an aid in the diagnosis of influenza from Nasopharyngeal swab specimens and should not be used as a sole basis for treatment. Nasal washings and aspirates are unacceptable for Xpert Xpress SARS-CoV-2/FLU/RSV testing.  Fact Sheet for Patients: EntrepreneurPulse.com.au  Fact Sheet for Healthcare Providers: IncredibleEmployment.be  This test is not yet approved or cleared by the [place] FDA and has been authorized for detection and/or diagnosis of SARS-CoV-2 by FDA under an Emergency Use Authorization (EUA). This EUA will remain in effect (meaning this test can be used) for the duration of the COVID-19 declaration under Section 564(b)(1) of the Act, 21 U.S.C. section 360bbb-3(b)(1), unless the authorization is terminated or revoked.  Performed at [facility], [place] [address] [place], [place] [address]   Urine Culture     Status: Abnormal   Collection Time: 07/19/21  5:04 PM   Specimen: In/Out Cath Urine  Result Value Ref Range Status   Specimen Description   Final    IN/OUT CATH URINE Performed at [place] [address] [place], [place] [address]    Special Requests   Final  NONE Performed at [facility], [place] [address], [place], [place] [address]    Culture >=100,000 COLONIES/mL PROTEUS MIRABILIS (A)  Final   Report Status 07/21/2021 FINAL  Final   Organism ID, Bacteria PROTEUS MIRABILIS (A)  Final      Susceptibility   Proteus mirabilis - MIC*    AMPICILLIN <=2 SENSITIVE Sensitive     CEFAZOLIN 8 SENSITIVE Sensitive     CEFEPIME <=0.12 SENSITIVE Sensitive     CEFTRIAXONE <=0.25 SENSITIVE Sensitive     CIPROFLOXACIN <=0.25 SENSITIVE Sensitive     GENTAMICIN <=1 SENSITIVE Sensitive     IMIPENEM 2 SENSITIVE Sensitive      NITROFURANTOIN 128 RESISTANT Resistant     TRIMETH/SULFA <=20 SENSITIVE Sensitive     AMPICILLIN/SULBACTAM <=2 SENSITIVE Sensitive     PIP/TAZO <=4 SENSITIVE Sensitive     * >=100,000 COLONIES/mL PROTEUS MIRABILIS  Culture, blood (Routine x 2)     Status: Abnormal (Preliminary result)   Collection Time: 07/19/21  5:42 PM   Specimen: BLOOD  Result Value Ref Range Status   Specimen Description   Final    BLOOD LEFT ANTECUBITAL Performed at [place] [address] [place], [place] [address]    Special Requests   Final    BOTTLES DRAWN AEROBIC AND ANAEROBIC Blood Culture results may not be optimal due to an excessive volume of blood received in culture bottles Performed at [facility] [address] [place], [place] [address]    Culture  Setup Time   Final    GRAM NEGATIVE RODS IN BOTH AEROBIC AND ANAEROBIC BOTTLES CRITICAL RESULT CALLED TO, READ BACK BY AND VERIFIED WITH: PHARMD J LEGGE [id number] AT 48 AM BY CM    Culture (A)  Final    PROTEUS MIRABILIS SUSCEPTIBILITIES TO FOLLOW Performed at [facility], [place] [address] [place], [place] [address]    Report Status PENDING  Incomplete  Culture, blood (Routine x 2)     Status: None (Preliminary result)   Collection Time: 07/19/21  5:42 PM   Specimen: BLOOD  Result Value Ref Range Status   Specimen Description   Final    BLOOD RIGHT ANTECUBITAL Performed at [facility] [address] [place], [place] [address]    Special Requests   Final    BOTTLES DRAWN AEROBIC AND ANAEROBIC Blood Culture results may not be optimal due to an excessive volume of blood received in culture bottles Performed at [facility] [address] [place], [place] [address]    Culture  Setup Time   Final    GRAM NEGATIVE RODS IN BOTH AEROBIC AND ANAEROBIC BOTTLES CRITICAL VALUE NOTED.  VALUE IS CONSISTENT WITH PREVIOUSLY REPORTED AND CALLED VALUE. Performed at  [facility], [place] [address], [place], [place] [address]    Culture GRAM NEGATIVE RODS  Final   Report Status PENDING  Incomplete  Blood Culture ID Panel (Reflexed)     Status: Abnormal   Collection Time: 07/19/21  5:42 PM  Result Value Ref Range Status   Enterococcus faecalis NOT DETECTED NOT DETECTED Final   Enterococcus Faecium NOT DETECTED NOT DETECTED Final   Listeria monocytogenes NOT DETECTED NOT DETECTED Final   Staphylococcus species NOT DETECTED NOT DETECTED Final   Staphylococcus aureus (BCID) NOT DETECTED NOT DETECTED Final   Staphylococcus epidermidis NOT DETECTED NOT DETECTED Final   Staphylococcus lugdunensis NOT DETECTED NOT DETECTED Final   Streptococcus species NOT DETECTED  NOT DETECTED Final   Streptococcus agalactiae NOT DETECTED NOT DETECTED Final   Streptococcus pneumoniae NOT DETECTED NOT DETECTED Final   Streptococcus pyogenes NOT DETECTED NOT DETECTED Final   A.calcoaceticus-baumannii NOT DETECTED NOT DETECTED Final   Bacteroides fragilis NOT DETECTED NOT DETECTED Final   Enterobacterales DETECTED (A) NOT DETECTED Final    Comment: Enterobacterales represent a large order of gram negative bacteria, not a single organism. CRITICAL RESULT CALLED TO, READ BACK BY AND VERIFIED WITH: PHARMD J LEGGE [id number] AT 52 AM BY CM    Enterobacter cloacae complex NOT DETECTED NOT DETECTED Final   Escherichia coli NOT DETECTED NOT DETECTED Final   Klebsiella aerogenes NOT DETECTED NOT DETECTED Final   Klebsiella oxytoca NOT DETECTED NOT DETECTED Final   Klebsiella pneumoniae NOT DETECTED NOT DETECTED Final   Proteus species DETECTED (A) NOT DETECTED Final    Comment: CRITICAL RESULT CALLED TO, READ BACK BY AND VERIFIED WITH: PHARMD J LEGGE [id number] AT 833 AM BY CM    Salmonella species NOT DETECTED NOT DETECTED Final   Serratia marcescens NOT DETECTED NOT DETECTED Final   Haemophilus influenzae NOT DETECTED NOT DETECTED Final   Neisseria meningitidis NOT DETECTED NOT  DETECTED Final   Pseudomonas aeruginosa NOT DETECTED NOT DETECTED Final   Stenotrophomonas maltophilia NOT DETECTED NOT DETECTED Final   Candida albicans NOT DETECTED NOT DETECTED Final   Candida auris NOT DETECTED NOT DETECTED Final   Candida glabrata NOT DETECTED NOT DETECTED Final   Candida krusei NOT DETECTED NOT DETECTED Final   Candida parapsilosis NOT DETECTED NOT DETECTED Final   Candida tropicalis NOT DETECTED NOT DETECTED Final   Cryptococcus neoformans/gattii NOT DETECTED NOT DETECTED Final   CTX-M ESBL NOT DETECTED NOT DETECTED Final   Carbapenem resistance IMP NOT DETECTED NOT DETECTED Final   Carbapenem resistance KPC NOT DETECTED NOT DETECTED Final   Carbapenem resistance NDM NOT DETECTED NOT DETECTED Final   Carbapenem resist OXA 48 LIKE NOT DETECTED NOT DETECTED Final   Carbapenem resistance VIM NOT DETECTED NOT DETECTED Final    Comment: Performed at [facility], [address] [place], [place] [address]  MRSA Next Gen by PCR, Nasal     Status: Abnormal   Collection Time: 07/19/21 10:34 PM   Specimen: Nasal Mucosa; Nasal Swab  Result Value Ref Range Status   MRSA by PCR Next Gen DETECTED (A) NOT DETECTED Final    Comment: RESULT CALLED TO, READ BACK BY AND VERIFIED WITH: B,CLAPP AT 0122 ON 07/20/21 BY A,MOHAMED (NOTE) The GeneXpert MRSA Assay (FDA approved for NASAL specimens only), is one component of a comprehensive MRSA colonization surveillance program. It is not intended to diagnose MRSA infection nor to guide or monitor treatment for MRSA infections. Test performance is not FDA approved in patients less than 27 years old. Performed at [facility], [place] [address], [place], [place] [address]     Pertinent Lab.    Latest Ref Rng & Units 07/21/2021    1:23 AM 07/20/2021    3:03 AM 07/19/2021    3:57 PM  CBC  WBC 4.0 - 10.5 K/uL 7.9  8.1  12.2   Hemoglobin 13.0 - 17.0 g/dL 10.3  9.3  13.9   Hematocrit 39.0 - 52.0 % 32.7  30.4   45.0   Platelets 150 - 400 K/uL 146  181  301       Latest Ref Rng & Units 07/21/2021    1:23 AM 07/20/2021    8:00  PM 07/20/2021    1:18 PM  CMP  Glucose 70 - 99 mg/dL  174  145   BUN 6 - 20 mg/dL  67  75   Creatinine 0.61 - 1.24 mg/dL 1.47  1.67  1.66   Sodium 135 - 145 mmol/L  145  142   Potassium 3.5 - 5.1 mmol/L  4.1  3.5   Chloride 98 - 111 mmol/L  118  121   CO2 22 - 32 mmol/L  19  13   Calcium 8.9 - 10.3 mg/dL  9.9  9.5      Pertinent Imaging today Plain films and CT images have been personally visualized and interpreted; radiology reports have been reviewed. Decision making incorporated into the Impression / Recommendations.  MR SACRUM SI JOINTS WO CONTRAST  Result Date: 07/20/2021 CLINICAL DATA:  ischium and sacrum deep necrotic wounds EXAM: MRI SACRUM WITHOUT CONTRAST TECHNIQUE: Multiplanar multi-sequence MR imaging of the sacrum was performed. No intravenous contrast was administered. COMPARISON:  None Available. FINDINGS: Urinary Tract: There is a Foley catheter in place with moderate distension of the urinary bladder and intraluminal gas. Persistent prominent of the partially visualized ureters. Bowel: There multiple loops of dilated small bowel noted in the pelvis. Vascular/Lymphatic: No pathologically enlarged lymph nodes. No significant vascular abnormality seen. Reproductive:  Unremarkable pain Other:  None Musculoskeletal: Chronic large sacral decubitus ulcer. There is continued, chronic osteomyelitis of S4 and S5 with progressive bony remodeling/erosion since the prior MRI in August 22. There is a new small area marrow edema and low T1 signal within the distal aspect of S3 (T2 sagittal image 15, coronal T1 image 9). There is increased adjacent presacral edema/phlegmon, likely non-drainable. Persistent intense intramuscular edema within the piriformis muscles and hip adductors bilaterally, likely reactive. The sacroiliac joints are unremarkable without SI joint effusion or  bony erosion. No pericapsular thickening or soft tissue swelling. Partially visualized right hip arthroplasty with associated susceptibility artifact. 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 IMPRESSION: Large sacral decubitus ulcer with chronic osteomyelitis and progressive bony remodeling/erosion of S4 and S5 in comparison to prior MRI in August 2022. New small area of osteomyelitis along the dorsal distal aspect of S3. Increased presacral edema/phlegmon. No drainable soft tissue abscess at this time. Persistent intense intramuscular edema within the piriformis and hip adductors bilaterally, likely reactive insert or myositis. Multiple dilated loops of small bowel noted. Recommend abdominal series. Electronically Signed   By: Maurine Simmering M.D.   On: 07/20/2021 21:59   MR ANKLE LEFT WO CONTRAST  Result Date: 07/20/2021 CLINICAL DATA:  necrotic wound, concern for osteomyelitis EXAM: MRI OF THE LEFT ANKLE WITHOUT CONTRAST TECHNIQUE: Multiplanar, multisequence MR imaging of the left ankle was performed. No intravenous contrast was administered. COMPARISON:  None Available. FINDINGS: Bones/Joint/Cartilage There is no significant marrow signal alteration. The cortex is intact. Pes cavus deformity. Ligaments Extensive artifact on axial images limiting evaluation. The ATFL and PTFL appear intact. The calcaneofibular ligament is intact. The deltoid is intact. The spring ligament is intact. Muscles and Tendons Peroneal tendons are intact. Posteromedial tendons appear intact. There is mild fraying and tendinosis at the distal posterior tibial tendon insertion. The anterior tendons are intact. Achilles tendon is intact with mild insertional tendinosis. Minimal retrocalcaneal bursal fluid. There is thickening increased signal of the central bundle plantar fascia with indistinctness along the medial aspect. There is intramuscular edema and muscle atrophy throughout the foot, related to denervation change. Soft  tissue There is focal intense plantar soft tissue  swelling along the hindfoot, most intense medially. No evidence of soft tissue abscess. IMPRESSION: Plantar soft tissue swelling along the heel. No evidence of soft tissue abscess or underlying osteomyelitis. Thickening and increased signal of the central bundle plantar fascia with indistinctness along the medial aspect, could reflect involvement by soft tissue infection. Electronically Signed   By: Maurine Simmering M.D.   On: 07/20/2021 21:37   US RENAL  Result Date: 07/20/2021 CLINICAL DATA:  Hydronephrosis EXAM: RENAL / URINARY TRACT ULTRASOUND COMPLETE COMPARISON:  CT done on 07/19/2021 FINDINGS: Right Kidney: Renal measurements: 10 x 5.1 x 4.6 cm = volume: 121 mL. There is no hydronephrosis. There is increased cortical echogenicity. Left Kidney: Renal measurements: 11.3 x 5.9 x 4.3 cm = volume: 136 mL. There is no hydronephrosis. Examination was technically difficult due to patient's body habitus. The slight increase in cortical echogenicity. There is no perinephric fluid collection. Bladder: Urinary bladder is empty due to indwelling Foley catheter and not adequately visualized for evaluation. Other: None. IMPRESSION: There is no hydronephrosis. There is interval resolution of bilateral hydronephrosis. There is no perinephric fluid collection. Increased cortical echogenicity may be technical artifact or suggest medical renal disease. Electronically Signed   By: Elmer Picker M.D.   On: 07/20/2021 17:03     I spent 55 minutes for this patient encounter including review of prior medical records, coordination of care with primary/other specialist with greater than 50% of time being face to face/counseling and discussing diagnostics/treatment plan with the patient/family.  Electronically signed by:   Rosiland Oz, MD Infectious Disease Physician [facility] Pager: [phone]

## 2021-07-21 NOTE — Progress Notes (Signed)
Urology Inpatient Progress Report  Uremia [N19] AKI (acute kidney injury) ([place]) [N17.9] Septic shock ([place]) [A41.9, R65.21] Severe sepsis ([place]) [A41.9, R65.20]    Intv/Subj: No acute events overnight. Patient is states that he is feeling significantly better, but remains fatigued. Ultrasound yesterday afternoon demonstrated resolution of his hydronephrosis. His vasopressor requirement has improved proved, and is now off pressors.  Active Problems:   Human immunodeficiency virus (HIV) disease ([place])   Unspecified severe protein-calorie malnutrition ([place])   Protein-calorie malnutrition, severe ([place])   Acute renal failure superimposed on stage 3a chronic kidney disease (HCC)   Chronic diastolic CHF (congestive heart failure) (HCC)   Normochromic normocytic anemia   Pressure injury of skin   Acute urinary retention   Candidal endocarditis   Endocarditis of mitral valve   Osteomyelitis (HCC)   Hypoglycemia   Abnormal CT of the abdomen   Severe sepsis (HCC)  Current Facility-Administered Medications  Medication Dose Route Frequency Provider Last Rate Last Admin   0.9 %  sodium chloride infusion  250 mL Intravenous Continuous Anders Simmonds, MD 10 mL/hr at 07/21/21 0900 Infusion Verify at 07/21/21 0900   acetaminophen (TYLENOL) tablet 650 mg  650 mg Oral Q6H PRN Toy Baker, MD       Or   acetaminophen (TYLENOL) suppository 650 mg  650 mg Rectal Q6H PRN Doutova, Anastassia, MD       ascorbic acid (VITAMIN C) tablet 500 mg  500 mg Oral BID Olalere, Adewale A, MD   500 mg at 07/20/21 2201   aspirin chewable tablet 81 mg  81 mg Oral Daily Anders Simmonds, MD       cefTRIAXone (ROCEPHIN) 2 g in sodium chloride 0.9 % 100 mL IVPB  2 g Intravenous Daily Rosiland Oz, MD   Stopped at 07/20/21 1119   Chlorhexidine Gluconate Cloth 2 % PADS 6 each  6 each Topical Daily Doutova, Anastassia, MD       darunavir-cobicistat (PREZCOBIX) 800-150 MG per tablet 1 tablet  1 tablet Oral Q  breakfast Rosiland Oz, MD   1 tablet at 07/21/21 0822   dextrose 5 % in lactated ringers infusion   Intravenous Continuous Olalere, Adewale A, MD 125 mL/hr at 07/21/21 0900 Infusion Verify at 07/21/21 0900   dextrose 50 % solution 50 mL  1 ampule Intravenous PRN Kathryne Eriksson, NP   50 mL at 07/19/21 2333   dolutegravir (TIVICAY) tablet 50 mg  50 mg Oral Daily Doutova, Anastassia, MD       famotidine (PEPCID) tablet 20 mg  20 mg Oral Daily Doutova, Anastassia, MD       feeding supplement (ENSURE ENLIVE / ENSURE PLUS) liquid 237 mL  237 mL Oral TID BM Doutova, Anastassia, MD   237 mL at 07/20/21 1351   heparin ADULT infusion 100 units/mL (25000 units/257m)  800 Units/hr Intravenous Continuous Glogovac, Nikola, RPH 6.5 mL/hr at 07/21/21 0900 650 Units/hr at 07/21/21 0900   HYDROmorphone (DILAUDID) injection 0.5 mg  0.5 mg Intravenous Q3H PRN Olalere, Adewale A, MD   0.5 mg at 07/21/21 0110   leptospermum manuka honey (MEDIHONEY) paste 1 Application  1 Application Topical Daily Olalere, Adewale A, MD       metoCLOPramide (REGLAN) injection 5 mg  5 mg Intravenous Q6H PRN Doutova, Anastassia, MD       metoCLOPramide (REGLAN) injection 5 mg  5 mg Intravenous Q6H PRN Doutova, Anastassia, MD   5 mg at 07/20/21 2313   midodrine (PROAMATINE) tablet 2.5 mg  2.5 mg Oral TID WC Doutova, Anastassia, MD   2.5 mg at 07/21/21 6294   mupirocin ointment (BACTROBAN) 2 % 1 Application  1 Application Nasal BID Toy Baker, MD   1 Application at 76/54/65 2208   norepinephrine (LEVOPHED) '4mg'$  in 241m (0.016 mg/mL) premix infusion  2-10 mcg/min Intravenous Titrated SAnders Simmonds MD   Stopped at 07/21/21 0036   nutrition supplement (JUVEN) (JUVEN) powder packet 1 packet  1 packet Oral BID BM Olalere, Adewale A, MD   1 packet at 07/20/21 1740   nystatin (MYCOSTATIN) 100000 UNIT/ML suspension 500,000 Units  5 mL Oral QID HGerald LeitzD, NP       Oral care mouth rinse  15 mL Mouth Rinse 4 times per day  DToy Baker MD   15 mL at 07/21/21 0828   Oral care mouth rinse  15 mL Mouth Rinse PRN Doutova, ANyoka Lint MD       oxyCODONE (Oxy IR/ROXICODONE) immediate release tablet 5 mg  5 mg Oral Q6H PRN DToy Baker MD   5 mg at 07/20/21 0029   rilpivirine (EDURANT) tablet 25 mg  25 mg Oral Q breakfast Doutova, Anastassia, MD   25 mg at 07/21/21 00354  thiamine (B-1) injection 100 mg  100 mg Intravenous Daily Doutova, Anastassia, MD   100 mg at 07/20/21 0019   voriconazole (VFEND) tablet 200 mg  200 mg Oral Q12H Doutova, Anastassia, MD   200 mg at 07/20/21 2201   zinc sulfate capsule 220 mg  220 mg Oral Daily Olalere, Adewale A, MD   220 mg at 07/20/21 1740     Objective: Vital: Vitals:   07/21/21 0400 07/21/21 0700 07/21/21 0800 07/21/21 0900  BP:  95/70 96/68 105/70  Pulse:  88    Resp:  (!) '9 15 15  '$ Temp: 98.8 F (37.1 C)  98 F (36.7 C)   TempSrc: Oral  Oral   SpO2:  100%    Weight:      Height:       I/Os: I/O last 3 completed shifts: In: 7204.5 [I.V.:6101.8; IV Piggyback:1102.7] Out: 6300 [Urine:6300]  Physical Exam:  General: Patient is in no apparent distress Lungs: Normal respiratory effort, chest expands symmetrically. GI: The abdomen is soft and nontender without mass. Foley: Foley catheter draining clear yellow urine Ext: lower extremities symmetric  Lab Results: Recent Labs    07/19/21 1557 07/20/21 0303 07/21/21 0123  WBC 12.2* 8.1 7.9  HGB 13.9 9.3* 10.3*  HCT 45.0 30.4* 32.7*   Recent Labs    07/20/21 0827 07/20/21 1318 07/20/21 2000 07/21/21 0123  NA 144 142 145  --   K 4.0 3.5 4.1  --   CL 120* 121* 118*  --   CO2 16* 13* 19*  --   GLUCOSE 112* 145* 174*  --   BUN 84* 75* 67*  --   CREATININE 1.80* 1.66* 1.67* 1.47*  CALCIUM 9.4 9.5 9.9  --    Recent Labs    07/19/21 1557  INR 1.5*   No results for input(s): "LABURIN" in the last 72 hours. Results for orders placed or performed during the hospital encounter of 07/19/21   Urine Culture     Status: Abnormal (Preliminary result)   Collection Time: 07/19/21  1:56 AM   Specimen: Urine, Clean Catch  Result Value Ref Range Status   Specimen Description   Final    URINE, CLEAN CATCH [facility]formed [facility] [address], [place]  [address]    Special Requests   Final    NONE Performed at [facility], [place] [address] [place], [place] [address]    Culture (A)  Final    >=100,000 COLONIES/mL GRAM NEGATIVE RODS SUSCEPTIBILITIES TO FOLLOW Performed at [facility], [place] [address], [place], [place] [address]    Report Status PENDING  Incomplete  Resp Panel by RT-PCR (Flu A&B, Covid) Urine, Catheterized     Status: None   Collection Time: 07/19/21  5:04 PM   Specimen: Urine, Catheterized; Nasal Swab  Result Value Ref Range Status   SARS Coronavirus 2 by RT PCR NEGATIVE NEGATIVE Final    Comment: (NOTE) SARS-CoV-2 target nucleic acids are NOT DETECTED.  The SARS-CoV-2 RNA is generally detectable in upper respiratory specimens during the acute phase of infection. The lowest concentration of SARS-CoV-2 viral copies this assay can detect is 138 copies/mL. A negative result does not preclude SARS-Cov-2 infection and should not be used as the sole basis for treatment or other patient management decisions. A negative result may occur with  improper specimen collection/handling, submission of specimen other than nasopharyngeal swab, presence of viral mutation(s) within the areas targeted by this assay, and inadequate number of viral copies(<138 copies/mL). A negative result must be combined with clinical observations, patient history, and epidemiological information. The expected result is Negative.  Fact Sheet for Patients:  EntrepreneurPulse.com.au  Fact Sheet for Healthcare Providers:  IncredibleEmployment.be  This test is no t yet approved or cleared by the [place] FDA and  has been authorized for detection and/or diagnosis of SARS-CoV-2 by FDA under an Emergency Use Authorization (EUA). This EUA will remain  in effect (meaning this test can be used) for the duration of the COVID-19 declaration under Section 564(b)(1) of the Act, 21 U.S.C.section 360bbb-3(b)(1), unless the authorization is terminated  or revoked sooner.       Influenza A by PCR NEGATIVE NEGATIVE Final   Influenza B by PCR NEGATIVE NEGATIVE Final    Comment: (NOTE) The Xpert Xpress SARS-CoV-2/FLU/RSV plus assay is intended as an aid in the diagnosis of influenza from Nasopharyngeal swab specimens and should not be used as a sole basis for treatment. Nasal washings and aspirates are unacceptable for Xpert Xpress SARS-CoV-2/FLU/RSV testing.  Fact Sheet for Patients: EntrepreneurPulse.com.au  Fact Sheet for Healthcare Providers: IncredibleEmployment.be  This test is not yet approved or cleared by the [place] FDA and has been authorized for detection and/or diagnosis of SARS-CoV-2 by FDA under an Emergency Use Authorization (EUA). This EUA will remain in effect (meaning this test can be used) for the duration of the COVID-19 declaration under Section 564(b)(1) of the Act, 21 U.S.C. section 360bbb-3(b)(1), unless the authorization is terminated or revoked.  Performed at [facility], [place] [address] [place], [place] [address]   Urine Culture     Status: Abnormal   Collection Time: 07/19/21  5:04 PM   Specimen: In/Out Cath Urine  Result Value Ref Range Status   Specimen Description   Final    IN/OUT CATH URINE Performed at [place] [address] [place], [place] [address]    Special Requests   Final    NONE Performed at [facility], [place] [address], [place], [place] [address]    Culture >=100,000 COLONIES/mL PROTEUS MIRABILIS (A)  Final   Report Status  07/21/2021 FINAL  Final   Organism ID, Bacteria PROTEUS MIRABILIS (A)  Final  Susceptibility   Proteus mirabilis - MIC*    AMPICILLIN <=2 SENSITIVE Sensitive     CEFAZOLIN 8 SENSITIVE Sensitive     CEFEPIME <=0.12 SENSITIVE Sensitive     CEFTRIAXONE <=0.25 SENSITIVE Sensitive     CIPROFLOXACIN <=0.25 SENSITIVE Sensitive     GENTAMICIN <=1 SENSITIVE Sensitive     IMIPENEM 2 SENSITIVE Sensitive     NITROFURANTOIN 128 RESISTANT Resistant     TRIMETH/SULFA <=20 SENSITIVE Sensitive     AMPICILLIN/SULBACTAM <=2 SENSITIVE Sensitive     PIP/TAZO <=4 SENSITIVE Sensitive     * >=100,000 COLONIES/mL PROTEUS MIRABILIS  Culture, blood (Routine x 2)     Status: Abnormal (Preliminary result)   Collection Time: 07/19/21  5:42 PM   Specimen: BLOOD  Result Value Ref Range Status   Specimen Description   Final    BLOOD LEFT ANTECUBITAL Performed at [place] [address] [place], [place] [address]    Special Requests   Final    BOTTLES DRAWN AEROBIC AND ANAEROBIC Blood Culture results may not be optimal due to an excessive volume of blood received in culture bottles Performed at [place] [address], [place], [place] [address]    Culture  Setup Time   Final    GRAM NEGATIVE RODS IN BOTH AEROBIC AND ANAEROBIC BOTTLES CRITICAL RESULT CALLED TO, READ BACK BY AND VERIFIED WITH: PHARMD J LEGGE [id number] AT 24 AM BY CM    Culture (A)  Final    PROTEUS MIRABILIS SUSCEPTIBILITIES TO FOLLOW Performed at [facility], [place] [name], [place], [place] [address]    Report Status PENDING  Incomplete  Culture, blood (Routine x 2)     Status: None (Preliminary result)   Collection Time: 07/19/21  5:42 PM   Specimen: BLOOD  Result Value Ref Range Status   Specimen Description   Final    BLOOD RIGHT ANTECUBITAL Performed at [place] [address] [place], [place] [address]    Special Requests   Final    BOTTLES DRAWN  AEROBIC AND ANAEROBIC Blood Culture results may not be optimal due to an excessive volume of blood received in culture bottles Performed at [place] [address], [place], [place] [address]    Culture  Setup Time   Final    GRAM NEGATIVE RODS IN BOTH AEROBIC AND ANAEROBIC BOTTLES CRITICAL VALUE NOTED.  VALUE IS CONSISTENT WITH PREVIOUSLY REPORTED AND CALLED VALUE. Performed at [facility], [place] [address], [place],  [address]    Culture GRAM NEGATIVE RODS  Final   Report Status PENDING  Incomplete  Blood Culture ID Panel (Reflexed)     Status: Abnormal   Collection Time: 07/19/21  5:42 PM  Result Value Ref Range Status   Enterococcus faecalis NOT DETECTED NOT DETECTED Final   Enterococcus Faecium NOT DETECTED NOT DETECTED Final   Listeria monocytogenes NOT DETECTED NOT DETECTED Final   Staphylococcus species NOT DETECTED NOT DETECTED Final   Staphylococcus aureus (BCID) NOT DETECTED NOT DETECTED Final   Staphylococcus epidermidis NOT DETECTED NOT DETECTED Final   Staphylococcus lugdunensis NOT DETECTED NOT DETECTED Final   Streptococcus species NOT DETECTED NOT DETECTED Final   Streptococcus agalactiae NOT DETECTED NOT DETECTED Final   Streptococcus pneumoniae NOT DETECTED NOT DETECTED Final   Streptococcus pyogenes NOT DETECTED NOT DETECTED Final   A.calcoaceticus-baumannii NOT DETECTED NOT DETECTED Final   Bacteroides fragilis NOT DETECTED NOT DETECTED Final  Enterobacterales DETECTED (A) NOT DETECTED Final    Comment: Enterobacterales represent a large order of gram negative bacteria, not a single organism. CRITICAL RESULT CALLED TO, READ BACK BY AND VERIFIED WITH: PHARMD J LEGGE [id number] AT 58 AM BY CM    Enterobacter cloacae complex NOT DETECTED NOT DETECTED Final   Escherichia coli NOT DETECTED NOT DETECTED Final   Klebsiella aerogenes NOT DETECTED NOT DETECTED Final   Klebsiella oxytoca NOT DETECTED NOT DETECTED Final   Klebsiella  pneumoniae NOT DETECTED NOT DETECTED Final   Proteus species DETECTED (A) NOT DETECTED Final    Comment: CRITICAL RESULT CALLED TO, READ BACK BY AND VERIFIED WITH: PHARMD J LEGGE [id number] AT 833 AM BY CM    Salmonella species NOT DETECTED NOT DETECTED Final   Serratia marcescens NOT DETECTED NOT DETECTED Final   Haemophilus influenzae NOT DETECTED NOT DETECTED Final   Neisseria meningitidis NOT DETECTED NOT DETECTED Final   Pseudomonas aeruginosa NOT DETECTED NOT DETECTED Final   Stenotrophomonas maltophilia NOT DETECTED NOT DETECTED Final   Candida albicans NOT DETECTED NOT DETECTED Final   Candida auris NOT DETECTED NOT DETECTED Final   Candida glabrata NOT DETECTED NOT DETECTED Final   Candida krusei NOT DETECTED NOT DETECTED Final   Candida parapsilosis NOT DETECTED NOT DETECTED Final   Candida tropicalis NOT DETECTED NOT DETECTED Final   Cryptococcus neoformans/gattii NOT DETECTED NOT DETECTED Final   CTX-M ESBL NOT DETECTED NOT DETECTED Final   Carbapenem resistance IMP NOT DETECTED NOT DETECTED Final   Carbapenem resistance KPC NOT DETECTED NOT DETECTED Final   Carbapenem resistance NDM NOT DETECTED NOT DETECTED Final   Carbapenem resist OXA 48 LIKE NOT DETECTED NOT DETECTED Final   Carbapenem resistance VIM NOT DETECTED NOT DETECTED Final    Comment: Performed at [facility], [address], [place], [place] [address]  MRSA Next Gen by PCR, Nasal     Status: Abnormal   Collection Time: 07/19/21 10:34 PM   Specimen: Nasal Mucosa; Nasal Swab  Result Value Ref Range Status   MRSA by PCR Next Gen DETECTED (A) NOT DETECTED Final    Comment: RESULT CALLED TO, READ BACK BY AND VERIFIED WITH: B,CLAPP AT 0122 ON 07/20/21 BY A,MOHAMED (NOTE) The GeneXpert MRSA Assay (FDA approved for NASAL specimens only), is one component of a comprehensive MRSA colonization surveillance program. It is not intended to diagnose MRSA infection nor to guide or monitor treatment for MRSA  infections. Test performance is not FDA approved in patients less than 78 years old. Performed at [facility], [place] [address], [place], [place] [address]     Studies/Results: MR SACRUM SI JOINTS WO CONTRAST  Result Date: 07/20/2021 CLINICAL DATA:  ischium and sacrum deep necrotic wounds EXAM: MRI SACRUM WITHOUT CONTRAST TECHNIQUE: Multiplanar multi-sequence MR imaging of the sacrum was performed. No intravenous contrast was administered. COMPARISON:  None Available. FINDINGS: Urinary Tract: There is a Foley catheter in place with moderate distension of the urinary bladder and intraluminal gas. Persistent prominent of the partially visualized ureters. Bowel: There multiple loops of dilated small bowel noted in the pelvis. Vascular/Lymphatic: No pathologically enlarged lymph nodes. No significant vascular abnormality seen. Reproductive:  Unremarkable pain Other:  None Musculoskeletal: Chronic large sacral decubitus ulcer. There is continued, chronic osteomyelitis of S4 and S5 with progressive bony remodeling/erosion since the prior MRI in August 22. There is a new small area marrow edema and low T1 signal within the distal aspect of S3 (T2 sagittal image  15, coronal T1 image 9). There is increased adjacent presacral edema/phlegmon, likely non-drainable. Persistent intense intramuscular edema within the piriformis muscles and hip adductors bilaterally, likely reactive. The sacroiliac joints are unremarkable without SI joint effusion or bony erosion. No pericapsular thickening or soft tissue swelling. Partially visualized right hip arthroplasty with associated susceptibility artifact. 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 IMPRESSION: Large sacral decubitus ulcer with chronic osteomyelitis and progressive bony remodeling/erosion of S4 and S5 in comparison to prior MRI in August 2022. New small area of osteomyelitis along the dorsal distal aspect of S3. Increased presacral  edema/phlegmon. No drainable soft tissue abscess at this time. Persistent intense intramuscular edema within the piriformis and hip adductors bilaterally, likely reactive insert or myositis. Multiple dilated loops of small bowel noted. Recommend abdominal series. Electronically Signed   By: Maurine Simmering M.D.   On: 07/20/2021 21:59   MR ANKLE LEFT WO CONTRAST  Result Date: 07/20/2021 CLINICAL DATA:  necrotic wound, concern for osteomyelitis EXAM: MRI OF THE LEFT ANKLE WITHOUT CONTRAST TECHNIQUE: Multiplanar, multisequence MR imaging of the left ankle was performed. No intravenous contrast was administered. COMPARISON:  None Available. FINDINGS: Bones/Joint/Cartilage There is no significant marrow signal alteration. The cortex is intact. Pes cavus deformity. Ligaments Extensive artifact on axial images limiting evaluation. The ATFL and PTFL appear intact. The calcaneofibular ligament is intact. The deltoid is intact. The spring ligament is intact. Muscles and Tendons Peroneal tendons are intact. Posteromedial tendons appear intact. There is mild fraying and tendinosis at the distal posterior tibial tendon insertion. The anterior tendons are intact. Achilles tendon is intact with mild insertional tendinosis. Minimal retrocalcaneal bursal fluid. There is thickening increased signal of the central bundle plantar fascia with indistinctness along the medial aspect. There is intramuscular edema and muscle atrophy throughout the foot, related to denervation change. Soft tissue There is focal intense plantar soft tissue swelling along the hindfoot, most intense medially. No evidence of soft tissue abscess. IMPRESSION: Plantar soft tissue swelling along the heel. No evidence of soft tissue abscess or underlying osteomyelitis. Thickening and increased signal of the central bundle plantar fascia with indistinctness along the medial aspect, could reflect involvement by soft tissue infection. Electronically Signed   By: Maurine Simmering M.D.   On: 07/20/2021 21:37   US RENAL  Result Date: 07/20/2021 CLINICAL DATA:  Hydronephrosis EXAM: RENAL / URINARY TRACT ULTRASOUND COMPLETE COMPARISON:  CT done on 07/19/2021 FINDINGS: Right Kidney: Renal measurements: 10 x 5.1 x 4.6 cm = volume: 121 mL. There is no hydronephrosis. There is increased cortical echogenicity. Left Kidney: Renal measurements: 11.3 x 5.9 x 4.3 cm = volume: 136 mL. There is no hydronephrosis. Examination was technically difficult due to patient's body habitus. The slight increase in cortical echogenicity. There is no perinephric fluid collection. Bladder: Urinary bladder is empty due to indwelling Foley catheter and not adequately visualized for evaluation. Other: None. IMPRESSION: There is no hydronephrosis. There is interval resolution of bilateral hydronephrosis. There is no perinephric fluid collection. Increased cortical echogenicity may be technical artifact or suggest medical renal disease. Electronically Signed   By: Elmer Picker M.D.   On: 07/20/2021 17:03   US Abdomen Limited RUQ (LIVER/GB)  Result Date: 07/20/2021 CLINICAL DATA:  61 year old male with abdominal pain. Distended gallbladder. EXAM: ULTRASOUND ABDOMEN LIMITED RIGHT UPPER QUADRANT COMPARISON:  Noncontrast CT Abdomen and Pelvis 07/19/2021. FINDINGS: Gallbladder: Distended gallbladder with perhaps mild dependent sludge (image 4). But no gallstones identified. Wall thickness remains normal at 2 mm. No pericholecystic  fluid. No sonographic Murphy sign elicited. Common bile duct: Diameter: 9-10 mm, dilated. No filling defect within the visible duct (image 20). Liver: No intrahepatic biliary ductal dilatation is evident. No discrete liver lesion. Background echogenicity within normal limits. Portal vein is patent on color Doppler imaging with normal direction of blood flow towards the liver. Other: Hydronephrotic right kidney redemonstrated (image 37). IMPRESSION: 1. Dilated CBD (11 mm) but no  filling defect in the visible duct. No intrahepatic biliary ductal dilatation. If there is hyperbilirubinemia consider choledocholithiasis or other distal obstruction. 2. Distended gallbladder, perhaps with mild sludge, but no stones or evidence of acute cholecystitis. 3. Right hydronephrosis. Electronically Signed   By: Genevie Ann M.D.   On: 07/20/2021 06:23   CT ABDOMEN PELVIS WO CONTRAST  Result Date: 07/19/2021 CLINICAL DATA:  Acute abdominal pain. Sepsis. Hypotension and abnormal labs. EXAM: CT ABDOMEN AND PELVIS WITHOUT CONTRAST TECHNIQUE: Multidetector CT imaging of the abdomen and pelvis was performed following the standard protocol without IV contrast. RADIATION DOSE REDUCTION: This exam was performed according to the departmental dose-optimization program which includes automated exposure control, adjustment of the mA and/or kV according to patient size and/or use of iterative reconstruction technique. COMPARISON:  CT 12/16/2020 FINDINGS: Lower chest: Basilar bronchiolectasis. Hypoventilatory changes in the dependent lungs. No pleural effusion. Normal heart size with coronary artery calcifications. Hepatobiliary: No evidence of focal hepatic abnormality on this unenhanced exam. Gallbladder is distended. No calcified gallstone. No definite pericholecystic fat stranding, although detailed assessment limited by motion. Pancreas: Parenchymal atrophy. No ductal dilatation or inflammation. Spleen: Normal in size without focal abnormality. Adrenals/Urinary Tract: No adrenal nodule. Moderate to advanced bilateral hydroureteronephrosis, with progression from prior exam. Both ureters are dilated to the bladder insertion. No evidence of obstructing calculi. Mild urothelial thickening distally. Foley catheter in the bladder, bladder is mildly distended despite appropriate Foley catheter placement. Moderate wall thickening with mild perivesicular fat stranding. There are punctate nonobstructing intrarenal stones.  Stomach/Bowel: Air-fluid level in the stomach which is moderately distended. No small bowel obstruction or inflammation. Normal appendix. Moderate volume of colonic stool. Vascular/Lymphatic: Mild aortic and branch atherosclerosis. No portal venous or mesenteric gas. No bulky abdominopelvic adenopathy Reproductive: Prostate is unremarkable. Other: Small fat containing left inguinal hernia. No free air or ascites. Musculoskeletal: Sacral decubitus ulcer with resorptive changes of the sacrum and coccyx. Right hip arthroplasty. The bones are markedly under mineralized. There is presacral soft tissue thickening, no drainable abscess or collection. IMPRESSION: 1. Moderate to advanced bilateral hydroureteronephrosis, with progression from prior exam. No evidence of obstructing calculi. Bladder is mildly distended despite appropriate Foley catheter placement. Moderate bladder wall thickening with mild perivesicular fat stranding, suspicious for urinary tract infection. Recommend correlation with urinalysis. 2. Sacral decubitus ulcer with resorptive changes of the sacrum and coccyx. Progression from prior exam suggestive of osteomyelitis. Presacral soft tissue thickening without drainable abscess or collection. 3. Distended gallbladder without calcified gallstone. If there is clinical concern for acute cholecystitis, recommend right upper quadrant ultrasound. 4. Punctate nonobstructing intrarenal stones. 5. Moderate gastric distension with air-fluid level, nonspecific. Aortic Atherosclerosis (ICD10-I70.0). Electronically Signed   By: Keith Rake M.D.   On: 07/19/2021 20:52   DG Chest 2 View  Result Date: 07/19/2021 CLINICAL DATA:  Suspected Sepsis Hypotension.  Abnormal labs. EXAM: CHEST - 2 VIEW COMPARISON:  Chest radiograph 03/17/2021, chest CT 06/22/2014 FINDINGS: The heart is normal in size. Stable mediastinal contours. No acute airspace disease. No pulmonary edema, pleural effusion, or pneumothorax. Apical  blebs and  scarring again seen. IMPRESSION: No acute chest findings. Electronically Signed   By: Keith Rake M.D.   On: 07/19/2021 16:59    Assessment/Plan: 61 year old with Proteus urosepsis secondary to an occluded Foley catheter.  His hydronephrosis appears to be resolved and related to his occluded catheter.  He is catheter dependent because of urethral strictures and inability to void in the past.  This was all taken care of at [facility].  However, recently he has seen Dr. Gloriann Loan in [place].   I recommended that he follow-up with Dr. Gloriann Loan annually.  I also think that he should have his Foley catheter is changed on a more frequent interval, and recommend that he have them changed every 3 weeks.    [name] Meckel, MD Urology 07/21/2021, 10:18 AM

## 2021-07-21 NOTE — Progress Notes (Signed)
NAME:  [name] [name], MRN:  [id number], DOB:  07-22-1960, LOS: 2 ADMISSION DATE:  07/19/2021, CONSULTATION DATE:  07/19/2021 REFERRING MD:  Dr Tyrell Antonio, CHIEF COMPLAINT:  Sepsis   History of Present Illness:  [name] [name] is a 61 year old gentleman with a history of HIV, previous endocarditis, previous osteomyelitis, baseline debility being bedbound for the past 1 to 2 years, decubitus ulcer, urinary retention requiring suprapubic catheter, recurrent urinary tract infections who presented after feeling poorly for the past 2 days.  He has had worse back pain and muscle spasms in his baseline, fevers, chills, hiccups, abdominal pain, nausea, vomiting.  His oral intake is increased recently since being started on Megace by his infectious disease physician, but over the last 2 days he has not been eating and drinking normally.  He was brought to the ED for evaluation.  He was given 2 L of crystalloid and started empirically on vancomycin, cefepime.  In the ED lactic acid was 5, PCCM was consulted for evaluation.  His wife is at bedside to corroborate history.  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    HIV (human immunodeficiency virus infection) ([place])    Hx of sepsis 2015   affected rt hip / femur   Pain in limb 07/23/2013   Pain in limb 07/23/2013   PONV (postoperative nausea and vomiting)    Hiccups for 4 days after anesthesia   Radiation    Rectal cancer (HCC)    Squamous cell   Severe sepsis; Candidia albicans bacteremia ([place]) 05/13/2020   Shingles    left shoulder, right leg   Thrombocytopenia ([place]) 12/21/2020    Significant Hospital Events: Including procedures, antibiotic start and stop dates in addition to other pertinent events   7/11-admitted with sepsis in the setting of UTI, Foley exchanged  CT ABD/pelvis 7/11 revealed moderate to advanced bilateral hydroureteronephrosis, with progression from prior exam. No evidence of obstructing calculi. Bladder is mildly distended despite appropriate Foley catheter placement 7/12 PCCM consulted for peripheral pressors  Renal US post foley exchange revealed no hydronephrosis with resolution of bilateral hydronephrosis. 7/13 Off pressors this am seen with oral thrush this am   Interim History / Subjective:  States he feels better this am but no yet back to baseline.  Complaining of throat pain   Objective   Blood pressure 95/70, pulse 88, temperature 98 F (36.7 C), temperature source Oral, resp. rate (!) 9, height '6\' 2"'$  (1.88 m), weight 53.2 kg, SpO2 100 %.        Intake/Output Summary (Last 24 hours) at 07/21/2021 0848 Last data filed at 07/21/2021 0700 Gross per 24 hour  Intake 4754.56 ml  Output 3825 ml  Net 929.56 ml    Filed Weights   07/19/21 1556 07/19/21 2243  Weight: 58.1 kg 53.2 kg    Examination: General: Acute on chronically ill deconditioned, middle aged male lying in bed, in NAD HEENT: [place]/AT, MM pink/moist, PERRL,  Neuro: Alert and oriented x3, generalized weakness CV: s1s2 regular rate  and rhythm, no murmur, rubs, or gallops,  PULM:  Clear to ascultation, no added breath sounds, no increased work of breathing, on RA GI: soft, bowel sounds active in all 4 quadrants, non-tender, non-distended, tolerating oral diet Extremities: warm/dry, no edema  Skin: no rashes or lesions  Resolved Hospital Problem list   Hyperkalemia  Assessment & Plan:  Severe sepsis with Proteus bacteremia  Lactic acidosis -On arrival patient was seen with tachycardia, tachypnea, leukocytosis with elevated lactic acid and signs of an acute infection  meeting signs of severe sepsis  -Blood cultures positive for Proteus and Enterobacterales, Urine culture positive for Proteus  P: ID following, appreciate assistance  Follow cultures  Continue IV Ceftriaxone  Pressors for MAP< 20, currently off  Trend lactic acid Monitor urine output  Large sacral decubitus ulcer chronic osteomyelitis with progression of osteomyelitis and new area of osteomyelitis -Per sacral MRI 7/12 P: Phone consult held with General Surgery and review of images reveled no need for surgical intervention at this time as no abscess was seen  Local wound care as below  ID following  Antibiotics as above  Supportive care  Pressure alleviating devices    Hydronephrosis - Resolved  -CT ABD/pelvis 7/11 revealed moderate to advanced bilateral hydroureteronephrosis, with progression from prior exam. No evidence of obstructing calculi. Bladder is mildly distended despite appropriate Foley catheter placement -Renal US post foley exchange revealed no hydronephrosis with resolution of bilateral hydronephrosis. P: Urology consulted, appreciate assistance Keep foley  Monitor urine output   Elevated HS Tropoin concerning for likely NSTEMI  -Patient was seen with possible rhythm change overnight (EKG with sinus tachycardia with 1st degree AV block) HS troponin was obtained at that time and was elevated at 448 with uptrend to 634. 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  -Deemed not a surgical candidate for repair given frailty  -ECHO 3/11 with no obvious MV vegetation but likely significant mitral regurgitation. EF 55-60% P: Consult cardiology  Continue IV heparin and ASA  Continuous telemetry  ECHO pending  Continue daily Voriconazole  Close monitoring of volume status  Optimize electrolytes    AKI -Creatine 2.87  with GFR 24 on admit 7/11, on 06/09/21 creatinine was 1.34 with GFR > 60 Azotemia P: Follow renal function  Monitor urine output Trend Bmet Avoid nephrotoxins Ensure adequate renal perfusion  IV hydration  Dilated common bile duct  -Dilated CBD (11 mm) but no filling defect in the visible duct on RUQ [place] 7/12 Hyperbilirubinemia -Total bilirubin 1.5 7/11 with downtrend to 1.2 7/12, direct bilirubin 0.6 7/12 P: Phone consult held with general surgery with no acute surgical needs identified. Recommended possible MRCP   Case was also discussed with GI and on review of chart no acute indications for MRCP was seen at this time. 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 Practial thickness wound to right lateral foot  P: WOC following  Pressure alleviating devices  Optimize nutrition   Best Practice (right click and "Reselect all SmartList Selections" daily)  Diet/type: Regular consistency (see orders) DVT prophylaxis: SCD GI prophylaxis: N/A Lines: N/A Foley:  N/A Code Status:  full code Last date of multidisciplinary goals of care discussion Steele Sizer is DNR status]   Critical care: 45 mins  Sajad Glander D. Kenton Kingfisher, NP-C [facility] Personal contact information can be found on Amion  07/21/2021, 10:10 AM

## 2021-07-21 NOTE — Progress Notes (Signed)
ANTICOAGULATION CONSULT NOTE - Initial Consult  Pharmacy Consult for heparin Indication: ACS/STEMI  Allergies  Allergen Reactions   Collagen Rash and Other (See Comments)    Redness and "Allergic," per [facility]    Patient Measurements: Height: '6\' 2"'$  (188 cm) Weight: 53.2 kg (117 lb 4.6 oz) IBW/kg (Calculated) : 82.2 Heparin Dosing Weight: 53.2kg  Vital Signs: Temp: 98.9 F (37.2 C) (07/12 1600) Temp Source: Oral (07/12 1600) BP: 99/73 (07/12 2130) Pulse Rate: 115 (07/12 2000)  Labs: Recent Labs    07/19/21 1557 07/19/21 1921 07/20/21 0303 07/20/21 0827 07/20/21 1318 07/20/21 2000 07/20/21 2247  HGB 13.9  --  9.3*  --   --   --   --   HCT 45.0  --  30.4*  --   --   --   --   PLT 301  --  181  --   --   --   --   APTT  --  22*  --   --   --   --   --   LABPROT 17.8*  --   --   --   --   --   --   INR 1.5*  --   --   --   --   --   --   CREATININE 2.87* 2.43* 1.97*  1.82* 1.80* 1.66* 1.67*  --   CKTOTAL  --  401*  --   --   --   --   --   TROPONINIHS  --   --   --   --   --   --  448*    Estimated Creatinine Clearance: 35.8 mL/min (A) (by C-G formula based on SCr of 1.67 mg/dL (H)).   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    HIV (human immunodeficiency virus infection) ([place])    Hx of sepsis 2015   affected rt hip / femur   Pain in limb 07/23/2013   Pain in limb 07/23/2013   PONV (postoperative nausea and vomiting)    Hiccups for 4 days after anesthesia   Radiation    Rectal cancer (HCC)    Squamous cell   Severe sepsis; Candidia albicans bacteremia ([place]) 05/13/2020   Shingles    left  shoulder, right leg   Thrombocytopenia ([place]) 12/21/2020     Assessment: 61 year old gentleman with a history of HIV, previous endocarditis, previous osteomyelitis, baseline debility being bedbound for the past 1 to 2 years, decubitus ulcer, urinary retention requiring suprapubic catheter, recurrent urinary tract infections who presented after feeling poorly.  Pharmacy consulted to dose heparin drip for ACS/STEMI  Hgb 9.3, Plts WNL, trop 448 PT 17.8, INR 1.5, aPTT 22 No AC noted   Goal of Therapy:  Heparin level 0.3-0.7 units/ml Monitor platelets by anticoagulation protocol: Yes   Plan:  Heparin bolus 3000 units x 1 Start heparin drip at 650 units/hr Heparin level in 6 hours Daily CBC   Dolly Rias RPh 07/21/2021, 12:06 AM

## 2021-07-21 NOTE — Plan of Care (Signed)
  Problem: Fluid Volume: Goal: Hemodynamic stability will improve Outcome: Progressing   Problem: Clinical Measurements: Goal: Signs and symptoms of infection will decrease Outcome: Progressing   Problem: Respiratory: Goal: Ability to maintain adequate ventilation will improve Outcome: Progressing   Problem: Pain Managment: Goal: General experience of comfort will improve Outcome: Not Progressing   Problem: Skin Integrity: Goal: Risk for impaired skin integrity will decrease Outcome: Not Progressing

## 2021-07-21 NOTE — Progress Notes (Signed)
An USGPIV (ultrasound guided PIV) has been placed for short-term vasopressor infusion. A correctly placed ivWatch must be used when administering Vasopressors. Should this treatment be needed beyond 72 hours, central line access should be obtained.  It will be the responsibility of the bedside nurse to follow best practice to prevent extravasations.   ?

## 2021-07-22 ENCOUNTER — Inpatient Hospital Stay (HOSPITAL_COMMUNITY): Payer: Medicare Other

## 2021-07-22 DIAGNOSIS — A419 Sepsis, unspecified organism: Secondary | ICD-10-CM | POA: Diagnosis not present

## 2021-07-22 DIAGNOSIS — R6520 Severe sepsis without septic shock: Secondary | ICD-10-CM | POA: Diagnosis not present

## 2021-07-22 LAB — CULTURE, BLOOD (ROUTINE X 2)

## 2021-07-22 LAB — GLUCOSE, CAPILLARY
Glucose-Capillary: 127 mg/dL — ABNORMAL HIGH (ref 70–99)
Glucose-Capillary: 118 mg/dL — ABNORMAL HIGH (ref 70–99)
Glucose-Capillary: 129 mg/dL — ABNORMAL HIGH (ref 70–99)
Glucose-Capillary: 79 mg/dL (ref 70–99)

## 2021-07-22 LAB — HIV-1 RNA QUANT-NO REFLEX-BLD
HIV 1 RNA Quant: 140 copies/mL
LOG10 HIV-1 RNA: 2.146 log10copy/mL

## 2021-07-22 LAB — COMPREHENSIVE METABOLIC PANEL WITH GFR
ALT: 20 U/L (ref 0–44)
AST: 27 U/L (ref 15–41)
Albumin: 2.4 g/dL — ABNORMAL LOW (ref 3.5–5.0)
Alkaline Phosphatase: 60 U/L (ref 38–126)
Anion gap: 7 (ref 5–15)
BUN: 45 mg/dL — ABNORMAL HIGH (ref 6–20)
CO2: 22 mmol/L (ref 22–32)
Calcium: 10 mg/dL (ref 8.9–10.3)
Chloride: 120 mmol/L — ABNORMAL HIGH (ref 98–111)
Creatinine, Ser: 1.13 mg/dL (ref 0.61–1.24)
GFR, Estimated: >60 mL/min
Glucose, Bld: 83 mg/dL (ref 70–99)
Potassium: 3.9 mmol/L (ref 3.5–5.1)
Sodium: 149 mmol/L — ABNORMAL HIGH (ref 135–145)
Total Bilirubin: 0.6 mg/dL (ref 0.3–1.2)
Total Protein: 6.2 g/dL — ABNORMAL LOW (ref 6.5–8.1)

## 2021-07-22 LAB — CBC
HCT: 33.9 % — ABNORMAL LOW (ref 39.0–52.0)
Hemoglobin: 10.4 g/dL — ABNORMAL LOW (ref 13.0–17.0)
MCH: 30.9 pg (ref 26.0–34.0)
MCHC: 30.7 g/dL (ref 30.0–36.0)
MCV: 100.6 fL — ABNORMAL HIGH (ref 80.0–100.0)
Platelets: 165 10*3/uL (ref 150–400)
RBC: 3.37 MIL/uL — ABNORMAL LOW (ref 4.22–5.81)
RDW: 20.4 % — ABNORMAL HIGH (ref 11.5–15.5)
WBC: 9.2 10*3/uL (ref 4.0–10.5)
nRBC: 0 % (ref 0.0–0.2)

## 2021-07-22 LAB — URINE CULTURE: Culture: >=100000 — AB

## 2021-07-22 MED ORDER — MEGESTROL ACETATE 400 MG/10ML PO SUSP
800.0000 mg | Freq: Every day | ORAL | Status: DC
  Filled 2021-07-22: qty 20

## 2021-07-22 MED ORDER — HALOPERIDOL LACTATE 5 MG/ML IJ SOLN: 2.5000 mg | INTRAMUSCULAR | Status: DC | PRN

## 2021-07-22 MED ORDER — POLYVINYL ALCOHOL 1.4 % OP SOLN: 1.0000 [drp] | Freq: Four times a day (QID) | OPHTHALMIC | Status: DC | PRN

## 2021-07-22 MED ORDER — GLYCOPYRROLATE 1 MG PO TABS: 1.0000 mg | ORAL_TABLET | ORAL | Status: DC | PRN

## 2021-07-22 MED ORDER — LORAZEPAM 2 MG/ML IJ SOLN: 0.5000 mg | INTRAMUSCULAR | Status: DC | PRN

## 2021-07-22 MED ORDER — BISACODYL 10 MG RE SUPP: 10.0000 mg | Freq: Every day | RECTAL | Status: DC | PRN

## 2021-07-22 MED ORDER — ONDANSETRON HCL 4 MG/2ML IJ SOLN
4.0000 mg | Freq: Four times a day (QID) | INTRAMUSCULAR | Status: DC | PRN
  Filled 2021-07-22: qty 2

## 2021-07-22 MED ORDER — SCOPOLAMINE 1 MG/3DAYS TD PT72
1.0000 | MEDICATED_PATCH | TRANSDERMAL | Status: DC
  Administered 2021-07-22: 1.5 mg via TRANSDERMAL
  Filled 2021-07-22: qty 1

## 2021-07-22 MED ORDER — HYDROMORPHONE HCL 1 MG/ML IJ SOLN
1.0000 mg | INTRAMUSCULAR | Status: DC | PRN
  Administered 2021-07-22 – 2021-07-23 (×7): 1 mg via INTRAVENOUS
  Filled 2021-07-22 (×7): qty 1

## 2021-07-22 MED ORDER — LORAZEPAM 2 MG/ML IJ SOLN
1.0000 mg | INTRAMUSCULAR | Status: DC | PRN
Start: 2021-07-22 — End: 2021-07-24
  Administered 2021-07-22: 1 mg via INTRAVENOUS
  Filled 2021-07-22: qty 1

## 2021-07-22 MED ORDER — DOCUSATE SODIUM 50 MG PO CAPS
50.0000 mg | ORAL_CAPSULE | Freq: Two times a day (BID) | ORAL | Status: DC
  Filled 2021-07-22 (×2): qty 1

## 2021-07-22 MED ORDER — GLYCOPYRROLATE 0.2 MG/ML IJ SOLN: 0.2000 mg | INTRAMUSCULAR | Status: DC | PRN

## 2021-07-22 MED ORDER — SIMETHICONE 80 MG PO CHEW
80.0000 mg | CHEWABLE_TABLET | Freq: Three times a day (TID) | ORAL | Status: DC
  Administered 2021-07-22: 80 mg via ORAL
  Filled 2021-07-22: qty 1

## 2021-07-22 MED ORDER — POLYETHYLENE GLYCOL 3350 17 G PO PACK
17.0000 g | PACK | Freq: Every day | ORAL | Status: DC
Start: 2021-07-22 — End: 2021-07-22
  Administered 2021-07-22: 17 g via ORAL
  Filled 2021-07-22: qty 1

## 2021-07-22 MED ORDER — METRONIDAZOLE 500 MG PO TABS
500.0000 mg | ORAL_TABLET | Freq: Two times a day (BID) | ORAL | Status: DC
  Administered 2021-07-22: 500 mg via ORAL
  Filled 2021-07-22: qty 1

## 2021-07-22 NOTE — Progress Notes (Signed)
PCCM Progress Note   Notified by both RN and Speech Therapy that patient was observed with signs of aspiration today during SLP eval. He is now on 15L HFNC post aspiration event (was on RA this morning) but work of breathing is stable. CXR with new left lower lobe opacification concerning for likely aspiration.   Given underlying severe malnutrition with multiple medical issues brief goals of care discussion was held with patient at bedside.  If aggressive interventions are desired patient will likely need at least a short-term feeding tube to allow for adequate nutrition and treatment of malnutrition.  However on 2 separate occasions patient has declined feeding tube insertion. I also called and discussed new clinical finding with [name], patient's sister, she shares POA with patient's other Sister [name] (I have not spoken with [name] directly yet).  Given his overall clinical scenario patient would likely not benefit from feeding tube and palliative care is likely next best step.  This was both relayed to patient and [name] and decision was made to proceed with palliative care consult.  Patient would like to further discuss plan of care with his sisters before final decision is made.  Alilah Mcmeans D. Kenton Kingfisher, NP-C [facility] Personal contact information can be found on Amion  07/22/2021, 1:48 PM

## 2021-07-22 NOTE — Progress Notes (Signed)
PHARMACY CONSULT NOTE FOR:  OUTPATIENT  PARENTERAL ANTIBIOTIC THERAPY (OPAT)  Indication: Proteus urosepsis and acute sacral osteo Regimen: Rocephin 2g IV every 24 hours and Flagyl 500 mg po every 12 hours End date: 08/02/21  After Rocephin/Flagyl course completed - planning Augmentin 875 mg po every 12 hours for 4 additional weeks  IV antibiotic discharge orders are pended. To discharging provider:  please sign these orders via discharge navigator,  Select New Orders & click on the button choice - Manage This Unsigned Work.     Thank you for allowing pharmacy to be a part of this patient's care.  Lawson Radar 07/22/2021, 9:31 AM

## 2021-07-22 NOTE — Plan of Care (Signed)
  Problem: Fluid Volume: Goal: Hemodynamic stability will improve 07/22/2021 0650 by Cristino Martes, RN Outcome: Progressing 07/22/2021 0649 by Cristino Martes, RN Outcome: Progressing   Problem: Clinical Measurements: Goal: Signs and symptoms of infection will decrease 07/22/2021 0650 by Cristino Martes, RN Outcome: Progressing 07/22/2021 0649 by Cristino Martes, RN Outcome: Progressing   Problem: Respiratory: Goal: Ability to maintain adequate ventilation will improve 07/22/2021 0650 by Cristino Martes, RN Outcome: Progressing 07/22/2021 0649 by Cristino Martes, RN Outcome: Progressing   Problem: Pain Managment: Goal: General experience of comfort will improve Outcome: Not Progressing   Problem: Skin Integrity: Goal: Risk for impaired skin integrity will decrease 07/22/2021 0650 by Cristino Martes, RN Outcome: Not Progressing 07/22/2021 0649 by Cristino Martes, RN Outcome: Not Progressing

## 2021-07-22 NOTE — Progress Notes (Signed)
Nutrition Follow-up  DOCUMENTATION CODES:   Severe malnutrition in context of chronic illness, Underweight  INTERVENTION:  - continue Ensure TID and Juven BID.  - monitor for ability to advance diet.    NUTRITION DIAGNOSIS:   Severe Malnutrition related to chronic illness (HIV) as evidenced by severe fat depletion, severe muscle depletion. -ongoing  GOAL:   Patient will meet greater than or equal to 90% of their needs -unmet  MONITOR:   PO intake, Supplement acceptance, Diet advancement, Labs, Weight trends, Skin  ASSESSMENT:   61 year old male with medical history HIV, previous endocarditis, previous osteomyelitis, baseline debility being bedbound for the past 1-2 years, decubitus ulcers, urinary retention requiring suprapubic catheter, and recurrent UTIs. He presented to the ED due to 2 days of feeling unwell. He reported worsening of back pain, muscle spasms, fevers, chills, hiccups, abdominal pain, and N/V. He was recently started on megace which was helpful. He was admitted due to HIV.  Patient remains on CLD at this time. Review of orders indicate he has accepted all Ensure and Juven supplements offered over the past 2 days.   Patient discussed in rounds this AM. Able to talk with CCM NP one-on-one about patient.   Patient laying in bed with no visitors present at the time of RD visit. He was sleeping initially but woke up to name call x2.   He reports having some liquids this AM but does not go into detail. He has Ensure and water on bedside table and reports that he will have some later on. Moved bedside table so it is within his reach.   He reports that his abdomen is feeling tight. He reports having a BM yesterday; flow sheet documentation indicates this was a smear.   Broached the topic of temporary small bore NGT and initiation of TF. Patient shares he is not interested in this and he then shares that now is not a good time for further conversation.   He has not  been weighed since admission on 7/11. Non-pitting edema to all extremities documented in the edema section of flow sheet.    Labs reviewed; CBGs: 127, 118 mg/dl, Na: 149 mmol/l, Cl: 120 mmol/l, BUN: 45 mg/dl.  Medications reviewed; 500 mg ascorbic acid BID, 50 mg colace BID, 20 mg oral pepcid/day, 625 mg megace/day starting 7/14 afternoon, 5 ml mycostatin QID, 17 g miralax/day, 100 mg IV thiamine/day, 220 mg zinc sulfate/day.    Diet Order:   Diet Order             Diet clear liquid Room service appropriate? Yes; Fluid consistency: Thin  Diet effective now                   EDUCATION NEEDS:   Education needs have been addressed  Skin:  Skin Assessment: Skin Integrity Issues: Skin Integrity Issues:: Stage I, Stage II, Stage III, Unstageable Stage I: sacrum Stage II: R foot Stage III: sacrum x2 Unstageable: full thickness to L heel  Last BM:  7/13 (type 2, smear)  Height:   Ht Readings from Last 1 Encounters:  07/19/21 '6\' 2"'$  (1.88 m)    Weight:   Wt Readings from Last 1 Encounters:  07/19/21 53.2 kg     BMI:  Body mass index is 15.06 kg/m.  Estimated Nutritional Needs:  Kcal:  2200-2500 kcal Protein:  110-125 grams Fluid:  >/= 2.5 L/day     Jarome Matin, MS, RD, LDN, CNSC Registered Dietitian II Inpatient Clinical Nutrition RD pager #  and on-call/weekend pager # available in [facility]

## 2021-07-22 NOTE — IPAL (Signed)
  Interdisciplinary Goals of Care Family Meeting   Date carried out:: 07/22/2021  Location of the meeting: Bedside  Member's involved: Nurse Practitioner, Bedside Registered Nurse, and Family Member or next of kin  Durable Power of Attorney or acting medical decision maker: Shared among sisters [name] [name] and [name] [name]. At this time [name] [name] is on a cruise and is unreachable.   Discussion: I again met with patient and [name] [name] at bedside to discuss goals of care. [name] [name] is more confused this afternoon and is unable to fully engage in the decision making process. Therefore,[name] [name] has taken on the role of the decision making.   [name] [name] had another vomiting episode event with gross aspiration resulting in need of both HFNC and NRB. At time time [name] [name] made the decision to proceed with comfort care only. Order placed   Code status: Full DNR  Disposition: In-patient comfort care   Time spent for the meeting: 35 mins   [name] [name] D. Kenton Kingfisher, NP-C [facility] Personal contact information can be found on Amion  07/22/2021, 6:35 PM

## 2021-07-22 NOTE — Progress Notes (Signed)
PCCM Progress Note  Notified by nurse that patient had a large volume emesis of ~1042m. This coupled with ABD distension and hypoactive bowel sounds patient is likely developing a bowel obstruction or ileus. At this time we will continue with conservative measures as patient has refused an NG tube.   [name] [name] D. HKenton Kingfisher NP-C [facility] Personal contact information can be found on Amion  07/22/2021, 6:52 PM

## 2021-07-22 NOTE — Progress Notes (Addendum)
NAME:  SION REINDERS, MRN:  [id number], DOB:  10-19-1960, LOS: 3 ADMISSION DATE:  07/19/2021, CONSULTATION DATE:  07/19/2021 REFERRING MD:  Dr Tyrell Antonio, CHIEF COMPLAINT:  Sepsis   History of Present Illness:  Mr. Giraud is a 61 year old gentleman with a history of HIV, previous endocarditis, previous osteomyelitis, baseline debility being bedbound for the past 1 to 2 years, decubitus ulcer, chronic foley, recurrent urinary tract infections who presented after feeling poorly for the past 2 days.  He has had worse back pain and muscle spasms in his baseline, fevers, chills, hiccups, abdominal pain, nausea, vomiting.  His oral intake is increased recently since being started on Megace by his infectious disease physician, but over the last 2 days he has not been eating and drinking normally.  He was brought to the ED for evaluation.  He was given 2 L of crystalloid and started empirically on vancomycin, cefepime.  In the ED lactic acid was 5, PCCM was consulted for evaluation.  His wife is at bedside to corroborate history.  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    HIV (human immunodeficiency virus infection) ([place])    Hx of sepsis 2015   affected rt hip / femur   Pain in limb 07/23/2013   Pain in limb 07/23/2013   PONV (postoperative nausea and vomiting)    Hiccups for 4 days after anesthesia   Radiation    Rectal cancer (HCC)    Squamous cell   Severe sepsis; Candidia albicans bacteremia ([place]) 05/13/2020   Shingles    left shoulder, right leg   Thrombocytopenia ([place]) 12/21/2020   Significant Hospital  Events: Including procedures, antibiotic start and stop dates in addition to other pertinent events   7/11-admitted with sepsis in the setting of UTI, Foley exchanged  CT ABD/pelvis 7/11 revealed moderate to advanced bilateral hydroureteronephrosis, with progression from prior exam. No evidence of obstructing calculi. Bladder is mildly distended despite appropriate Foley catheter placement 7/12 PCCM consulted for peripheral pressors  Renal US post foley exchange revealed no hydronephrosis with resolution of bilateral hydronephrosis. 7/13 Off pressors this am seen with oral thrush this am  7/14 Poor appetite but states he would not be willing to try a feeding tube. No major issues overnight    Interim History / Subjective:  Repots very mild diffuse ABD pain  Sister at bedside and updated   Objective   Blood pressure 112/75, pulse 89, temperature 98.3 F (36.8 C), temperature source Oral, resp. rate 16, height '6\' 2"'$  (1.88 m), weight 53.2 kg, SpO2 95 %.        Intake/Output Summary (Last 24 hours) at 07/22/2021 0846 Last data filed at 07/22/2021 0308 Gross per 24 hour  Intake 1326.95 ml  Output 1512 ml  Net -185.05 ml    Filed Weights   07/19/21 1556 07/19/21 2243  Weight: 58.1 kg 53.2 kg    Examination: General: Acute on chronically frail deconditioned middle aged male lying in bed, in NAD HEENT: [place]/AT, MM pink/moist, PERRL,  Neuro: Alert and oriented x3, non-focal  CV: s1s2 regular rate and rhythm, no murmur, rubs, or gallops,  PULM:  Clear to ascultation, no increased work of breathing, on RA GI: soft, bowel sounds hypoactiveactive in all 4 quadrants, tender to palpation, slightly distended, Extremities: warm/dry, no edema  Skin: no rashes or lesions  Resolved Hospital Problem list   Hyperkalemia Severe sepsis Postobstructive AKI Azotemia  Assessment & Plan:  Proteus bacteremia  -Blood cultures positive for Proteus and Enterobacterales, Urine culture positive for  Proteus  -Chronic foley exchange in ED  P: ID following, appreciate assistance  Continue IV Ceftriaxone   Large sacral decubitus ulcer chronic osteomyelitis with progression of osteomyelitis and new area of osteomyelitis -Per sacral MRI 7/12 -Phone consult held with General Surgery and review of images reveled no need for surgical intervention at this time as no abscess was seen  Multiple pressure injuries  -Stage 4 sacrum, 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 No evidence of obstructing calculi. Bladder is mildly distended despite appropriate Foley catheter placement -Renal US post foley exchange revealed no hydronephrosis with resolution of bilateral hydronephrosis. P: Will need outpatient Urology follow up Maintain foley  Monitor urine output   Elevated HS Tropoin concerning for likely NSTEMI  -Patient was seen with possible rhythm change overnight (EKG with sinus tachycardia with 1st degree AV block) HS troponin was obtained at that time and was elevated at 448 with uptrend to 634. 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  -Deemed not a surgical candidate for repair given frailty  -ECHO 3/11 with no obvious MV vegetation but likely significant mitral regurgitation. EF 55-60% P: Discussed  case again with Cardiology 7/13 and deemed not candidate for interventions  Repeat ECHO pending  Continuous telemetry  Continue daily Voriconazole  Close monitoring of volume status  Optimize electrolytes   Dilated common bile duct  -Dilated CBD (11 mm) but no filling defect in the visible duct on RUQ [place] 7/12 -7/13 Phone consult held with general surgery with no acute surgical needs identified. Recommended possible MRCP. Case was also discussed with GI and on review of chart no acute indications for MRCP was seen at this time. Hyperbilirubinemia -Total bilirubin 1.5 7/11 with downtrend to 1.2 7/12, direct bilirubin 0.6 7/12 P: Repeat LFT stable 7/14, total billy normal  Diffuse ABD pain likely secondary to constipation, see below   Constipation - 7/14 on assessment patient has a mildly distended ABD with hypoactive bowel sounds  P: Start bowel regiment  Consider KUB   HIV P: Primary management per ID  Continue home antiretroviral's   Failure to thrive Debility Severe malnutrition  -Spoke to patient 7/14 regarding benefits from feeding tube given severe malnutrition but he currently does not want to pursue this  P: Encourage oral intake  Protein supplementation  Oral thrush P: Continue nystatin swish and swallow  Best Practice (right click  and "Reselect all SmartList Selections" daily)   Diet/type: Regular consistency (see orders) DVT prophylaxis: SCD GI prophylaxis: N/A Lines: N/A Foley:  N/A Code Status:  full code Last date of multidisciplinary goals of care discussion Steele Sizer is DNR status]   Critical care: 38 mins  Derrion Tritz D. Kenton Kingfisher, NP-C [facility] Personal contact information can be found on Amion  07/22/2021, 8:46 AM

## 2021-07-22 NOTE — Progress Notes (Signed)
Speech Language Pathology Treatment: Dysphagia  Patient Details Name: [name] [name] MRN: [id number] DOB: 1960/09/15 Today's Date: 07/22/2021 Time: 9833-8250 SLP Time Calculation (min) (ACUTE ONLY): 23 min  Assessment / Plan / Recommendation Clinical Impression  Pt greeted in bed, congested and weak cough noted with hoarse voice- much more pronounced than during  prior session   With sliding pt up in bed and after liquid consumption, frequent eructation followed by cough (x1 after liquid consumption) noted.  Of note, eructation was noted during prior exam in 2022.  PO trials included moistened crackers, applesauce, and water. Prolonged mastication of solids with lingual retention noted with pt needing liquid wash to clear.     De-sating during session to mid and upper 80s noted after coughing with intake.   RN placed nasal cannula and oxygen on pt with respiratory improvement.  Via head nods - pt admits to sensing that the liquids "went the wrong way" while swallowing - not reflux related. .  Suspect multifactorial dysphagia, PTA but possibly exacerbated currently based on conversation. Doubtful instrumental swallow evaluation would change pt's outcomes given his poor intake and suspected chronicity of deficits.  Messaged critical care re: session/findings.  Note pt advised he does not want a feeding tube.   SLP will continue efforts- if desire instrumental evaluation - please order MBS.  Recommend small frequent meals in the interm as pt tolerates.     HPI HPI: [name] is a 61 year old gentleman with a history of HIV, previous endocarditis, previous osteomyelitis, baseline debility being bedbound for the past 1 to 2 years, decubitus ulcer, urinary retention requiring suprapubic catheter, recurrent urinary tract infections who presented after feeling poorly for the past 2 days.  He has had worse back pain and muscle spasms in his baseline, fevers, chills, hiccups, abdominal pain, nausea, vomiting.   His oral intake is increased recently since being started on Megace by his infectious disease physician, but over the last 2 days he has not been eating and drinking normally.  Pt also with h/o falls after ETOH consumption, post-fall CVA 2022, rectal ca s/p XRT and  CT neck Prominent spondylosis at C5-6 and C6-7. Mild diffuse facet hypertrophy..  Swallow eval ordered. Pt admits to dysphagia - pointing to proximal esophagus.Marland Kitchen      SLP Plan  Continue with current plan of care      Recommendations for follow up therapy are one component of a multi-disciplinary discharge planning process, led by the attending physician.  Recommendations may be updated based on patient status, additional functional criteria and insurance authorization.    Recommendations  Diet recommendations: Thin liquid;Nectar-thick liquid;Dysphagia 3 (mechanical soft) Liquids provided via: Cup;Straw Medication Administration: Whole meds with puree Supervision: Staff to assist with self feeding;Full supervision/cueing for compensatory strategies Compensations: Slow rate;Small sips/bites Postural Changes and/or Swallow Maneuvers: Seated upright 90 degrees;Upright 30-60 min after meal                Oral Care Recommendations: Oral care BID Follow Up Recommendations: Other (comment) Assistance recommended at discharge: Frequent or constant Supervision/Assistance SLP Visit Diagnosis: Dysphagia, oropharyngeal phase (R13.12) (concern for esophageal component) Plan: Continue with current plan of care         Kathleen Lime, MS [place] Office [phone] Pager [phone]   Macario Golds  07/22/2021, 2:32 PM

## 2021-07-22 NOTE — Progress Notes (Signed)
OPAT   Diagnosis: sacral osteomyelitis   Culture Result: Proteus mirabilis  Allergies  Allergen Reactions   Collagen Rash and Other (See Comments)    Redness and "Allergic," per [facility]    OPAT Orders Discharge antibiotics to be given via PICC line Discharge antibiotics:ceftriaxone 2 g iv daily  Per pharmacy protocol  Aim for Vancomycin trough 15-20 or AUC 400-550 (unless otherwise indicated) Duration: 2 weeks  End Date: 08/02/21  [facility] Care Per Protocol:  Home health RN for IV administration and teaching; PICC line care and labs.    Labs weekly while on IV antibiotics: _X_ CBC with differential __ BMP _X_ CMP __ CRP and ESR every 2 weeks __ Vancomycin trough __ CK  __ Please pull PIC at completion of IV antibiotics _X_ Please leave PIC in place until doctor has seen patient or been notified  Fax weekly labs to [phone]  Clinic Follow Up Appt: 7/18, will be rescheduled per Dr [name]  Rosiland Oz, MD Infectious Disease Physician [facility] [address] [place], [place] [address] Phone: [phone]  Fax: [phone]

## 2021-07-22 NOTE — Progress Notes (Addendum)
[facility] Infectious Diseases Follow Up Note  Patient Identification: Patient Name: [name] [name] MRN: [id number] [place] Date: 07/19/2021  3:39 PM Age: 61 y.o.Today's Date: 07/22/2021  Reason for Visit: GNR bacteremia and sacral osteomyelitis   Active Problems:   Human immunodeficiency virus (HIV) disease ([place])   Unspecified severe protein-calorie malnutrition ([place])   Protein-calorie malnutrition, severe ([place])   Acute renal failure superimposed on stage 3a chronic kidney disease (HCC)   Chronic diastolic CHF (congestive heart failure) (HCC)   Normochromic normocytic anemia   Pressure injury of skin   Acute urinary retention   Candidal endocarditis   Endocarditis of mitral valve   Osteomyelitis (HCC)   Hypoglycemia   Abnormal CT of the abdomen   Severe sepsis (HCC)  Antibiotics:  Vancomycin 7/11 Cefepime 7/11 Ceftriaxone 7/12-c Voricanazole PTA   Lines/Hardware: RT THA   Interval Events: afebrile, no leukocytosis.   Assessment # Proteus mirabilis bacteremia secondary to obstructed Foley's catheter/UTI -shock has resolved, US kidneys 7/12 With resolution of hydronephrosis bilaterally.  No perinephric fluid collection.   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 PCCM phone consulted surgery and no surgical intervention planned. Recommended wound care    # left plantar foot ulcer ( necrotic) and Rt lateral foot wound: No evidence of abscess and OM in left ankle MRI 07/20/21   # HIV -followed by [name], on Prezcobix/Endurant/Tivicay. Acute drop in cd4 likely  reactive in the setting of acute infection   # Dilated CBD in [place] - LFTs WNL. No intervention per Surgery/GI  Recommendations Continue ceftriaxone ( MIC of cefazolin in one of the urine cx 8, MIC in blood cx <4), add metronidazole. Plan for 2 weeks of IV ceftriaxone and metronidazole PO followed by following 4 weeks of PO augmentin Midline for IV abtx ID pharmacy to place OPAT orders I have staff messaged Dr [name] to reschedule patient's prior appt on 7/18 at a later date per patient and sister;s request ID will sign off. Please call with questions  Rest of the management as per the primary team. Thank you for the consult. Please page with pertinent questions or concerns. ______________________________________________________________________ Subjective patient seen and examined at the bedside.  Nausea +, mild back pain in the setting of lying in bed for prolonged time   Vitals BP 122/85   Pulse 98   Temp 98.1 F (36.7 C) (Oral)   Resp 16   Ht '6\' 2"'$  (1.88 m)   Wt 53.2 kg   SpO2 98%   BMI 15.06 kg/m     Physical Exam Constitutional:      Comments: sitting up in the bed and not in acute distress   Cardiovascular:     Rate and Rhythm: Normal rate and regular rhythm.     Heart sounds:  Pulmonary:     Effort: Pulmonary effort is normal on RA    Comments:   Abdominal:     Palpations: Abdomen is soft.     Tenderness: non distended and non tender   Musculoskeletal:        General: No swelling or tenderness.   Skin:    Comments: stage 4 sacral ulcer, unstageable PI to left ischium, full  thickness necrotic wounds in the left plantar foot and partial thickness wound at the rt lateral foot  Neurological:     General: Functional quadriplegia   Psychiatric:        Mood and Affect: Mood normal.   Pertinent Microbiology Results for orders placed or performed during the hospital encounter of 07/19/21  Urine Culture     Status: Abnormal   Collection Time: 07/19/21  1:56 AM    Specimen: Urine, Clean Catch  Result Value Ref Range Status   Specimen Description   Final    URINE, CLEAN CATCH Performed at [facility], [place] [address], [place], [place] [address]    Special Requests   Final    NONE Performed at [facility], [place] [address], [place], [place] [address]    Culture >=100,000 COLONIES/mL PROTEUS MIRABILIS (A)  Final   Report Status 07/22/2021 FINAL  Final   Organism ID, Bacteria PROTEUS MIRABILIS (A)  Final      Susceptibility   Proteus mirabilis - MIC*    AMPICILLIN <=2 SENSITIVE Sensitive     CEFAZOLIN <=4 SENSITIVE Sensitive     CEFEPIME <=0.12 SENSITIVE Sensitive     CEFTRIAXONE <=0.25 SENSITIVE Sensitive     CIPROFLOXACIN <=0.25 SENSITIVE Sensitive     GENTAMICIN <=1 SENSITIVE Sensitive     IMIPENEM 2 SENSITIVE Sensitive     NITROFURANTOIN 128 RESISTANT Resistant     TRIMETH/SULFA <=20 SENSITIVE Sensitive     AMPICILLIN/SULBACTAM <=2 SENSITIVE Sensitive     PIP/TAZO <=4 SENSITIVE Sensitive     * >=100,000 COLONIES/mL PROTEUS MIRABILIS  Resp Panel by RT-PCR (Flu A&B, Covid) Urine, Catheterized     Status: None   Collection Time: 07/19/21  5:04 PM   Specimen: Urine, Catheterized; Nasal Swab  Result Value Ref Range Status   SARS Coronavirus 2 by RT PCR NEGATIVE NEGATIVE Final    Comment: (NOTE) SARS-CoV-2 target nucleic acids are NOT DETECTED.  The SARS-CoV-2 RNA is generally detectable in upper respiratory specimens during the acute phase of infection. The lowest concentration of SARS-CoV-2 viral copies this assay can detect is 138 copies/mL. A negative result does not preclude SARS-Cov-2 infection and should not be used as the sole basis for treatment or other patient management decisions. A negative result may occur with  improper specimen collection/handling, submission of specimen other than nasopharyngeal swab, presence of viral mutation(s) within the areas targeted by this assay, and  inadequate number of viral copies(<138 copies/mL). A negative result must be combined with clinical observations, patient history, and epidemiological information. The expected result is Negative.  Fact Sheet for Patients:  EntrepreneurPulse.com.au  Fact Sheet for Healthcare Providers:  IncredibleEmployment.be  This test is no t yet approved or cleared by the [place] FDA and  has been authorized for detection and/or diagnosis of SARS-CoV-2 by FDA under an Emergency Use Authorization (EUA). This EUA will remain  in effect (meaning this test can be used) for the duration of the COVID-19 declaration under Section 564(b)(1) of the Act, 21 U.S.C.section 360bbb-3(b)(1), unless the authorization is terminated  or revoked sooner.       Influenza A by PCR NEGATIVE NEGATIVE Final   Influenza B by PCR NEGATIVE NEGATIVE Final    Comment: (NOTE) The Xpert Xpress SARS-CoV-2/FLU/RSV plus assay is intended as an aid in the diagnosis of influenza from Nasopharyngeal swab specimens and should not be used as a sole basis for treatment. Nasal washings and aspirates are unacceptable for Xpert Xpress SARS-CoV-2/FLU/RSV  testing.  Fact Sheet for Patients: EntrepreneurPulse.com.au  Fact Sheet for Healthcare Providers: IncredibleEmployment.be  This test is not yet approved or cleared by the [place] FDA and has been authorized for detection and/or diagnosis of SARS-CoV-2 by FDA under an Emergency Use Authorization (EUA). This EUA will remain in effect (meaning this test can be used) for the duration of the COVID-19 declaration under Section 564(b)(1) of the Act, 21 U.S.C. section 360bbb-3(b)(1), unless the authorization is terminated or revoked.  Performed at [facility], [place] [address] [place], [place] [address]   Urine Culture     Status: Abnormal   Collection Time: 07/19/21  5:04 PM    Specimen: In/Out Cath Urine  Result Value Ref Range Status   Specimen Description   Final    IN/OUT CATH URINE Performed at [facility] [address], [place], [place] [address]    Special Requests   Final    NONE Performed at [facility], [place] [address], [place], [place] [address]    Culture >=100,000 COLONIES/mL PROTEUS MIRABILIS (A)  Final   Report Status 07/21/2021 FINAL  Final   Organism ID, Bacteria PROTEUS MIRABILIS (A)  Final      Susceptibility   Proteus mirabilis - MIC*    AMPICILLIN <=2 SENSITIVE Sensitive     CEFAZOLIN 8 SENSITIVE Sensitive     CEFEPIME <=0.12 SENSITIVE Sensitive     CEFTRIAXONE <=0.25 SENSITIVE Sensitive     CIPROFLOXACIN <=0.25 SENSITIVE Sensitive     GENTAMICIN <=1 SENSITIVE Sensitive     IMIPENEM 2 SENSITIVE Sensitive     NITROFURANTOIN 128 RESISTANT Resistant     TRIMETH/SULFA <=20 SENSITIVE Sensitive     AMPICILLIN/SULBACTAM <=2 SENSITIVE Sensitive     PIP/TAZO <=4 SENSITIVE Sensitive     * >=100,000 COLONIES/mL PROTEUS MIRABILIS  Culture, blood (Routine x 2)     Status: Abnormal   Collection Time: 07/19/21  5:42 PM   Specimen: BLOOD  Result Value Ref Range Status   Specimen Description   Final    BLOOD LEFT ANTECUBITAL Performed at [facility] [address], [place], [place] [address]    Special Requests   Final    BOTTLES DRAWN AEROBIC AND ANAEROBIC Blood Culture results may not be optimal due to an excessive volume of blood received in culture bottles Performed at [facility] [address] [place], [place] [address]    Culture  Setup Time   Final    GRAM NEGATIVE RODS IN BOTH AEROBIC AND ANAEROBIC BOTTLES CRITICAL RESULT CALLED TO, READ BACK BY AND VERIFIED WITH: PHARMD J LEGGE [id number] AT 833 AM BY CM Performed at [facility], [place] [address] [place],  [address]    Culture PROTEUS MIRABILIS (A)  Final   Report Status 07/22/2021 FINAL   Final   Organism ID, Bacteria PROTEUS MIRABILIS  Final      Susceptibility   Proteus mirabilis - MIC*    AMPICILLIN <=2 SENSITIVE Sensitive     CEFAZOLIN <=4 SENSITIVE Sensitive     CEFEPIME <=0.12 SENSITIVE Sensitive     CEFTAZIDIME <=1 SENSITIVE Sensitive     CEFTRIAXONE <=0.25 SENSITIVE Sensitive     CIPROFLOXACIN <=0.25 SENSITIVE Sensitive     GENTAMICIN <=1 SENSITIVE Sensitive     IMIPENEM 2 SENSITIVE Sensitive     TRIMETH/SULFA <=20 SENSITIVE Sensitive     AMPICILLIN/SULBACTAM <=2 SENSITIVE Sensitive     PIP/TAZO <=4 SENSITIVE Sensitive     *  PROTEUS MIRABILIS  Culture, blood (Routine x 2)     Status: Abnormal   Collection Time: 07/19/21  5:42 PM   Specimen: BLOOD  Result Value Ref Range Status   Specimen Description   Final    BLOOD RIGHT ANTECUBITAL Performed at [facility] [address] [place], [place] [address]    Special Requests   Final    BOTTLES DRAWN AEROBIC AND ANAEROBIC Blood Culture results may not be optimal due to an excessive volume of blood received in culture bottles Performed at [facility] [address] [place], [place] [address]    Culture  Setup Time   Final    GRAM NEGATIVE RODS IN BOTH AEROBIC AND ANAEROBIC BOTTLES CRITICAL VALUE NOTED.  VALUE IS CONSISTENT WITH PREVIOUSLY REPORTED AND CALLED VALUE.    Culture (A)  Final    PROTEUS MIRABILIS SUSCEPTIBILITIES PERFORMED ON PREVIOUS CULTURE WITHIN THE LAST 5 DAYS. Performed at [facility], [place] [address] [place], [place] [address]    Report Status 07/22/2021 FINAL  Final  Blood Culture ID Panel (Reflexed)     Status: Abnormal   Collection Time: 07/19/21  5:42 PM  Result Value Ref Range Status   Enterococcus faecalis NOT DETECTED NOT DETECTED Final   Enterococcus Faecium NOT DETECTED NOT DETECTED Final   Listeria monocytogenes NOT DETECTED NOT DETECTED Final   Staphylococcus species NOT DETECTED NOT DETECTED Final   Staphylococcus aureus  (BCID) NOT DETECTED NOT DETECTED Final   Staphylococcus epidermidis NOT DETECTED NOT DETECTED Final   Staphylococcus lugdunensis NOT DETECTED NOT DETECTED Final   Streptococcus species NOT DETECTED NOT DETECTED Final   Streptococcus agalactiae NOT DETECTED NOT DETECTED Final   Streptococcus pneumoniae NOT DETECTED NOT DETECTED Final   Streptococcus pyogenes NOT DETECTED NOT DETECTED Final   A.calcoaceticus-baumannii NOT DETECTED NOT DETECTED Final   Bacteroides fragilis NOT DETECTED NOT DETECTED Final   Enterobacterales DETECTED (A) NOT DETECTED Final    Comment: Enterobacterales represent a large order of gram negative bacteria, not a single organism. CRITICAL RESULT CALLED TO, READ BACK BY AND VERIFIED WITH: PHARMD J LEGGE [id number] AT 50 AM BY CM    Enterobacter cloacae complex NOT DETECTED NOT DETECTED Final   Escherichia coli NOT DETECTED NOT DETECTED Final   Klebsiella aerogenes NOT DETECTED NOT DETECTED Final   Klebsiella oxytoca NOT DETECTED NOT DETECTED Final   Klebsiella pneumoniae NOT DETECTED NOT DETECTED Final   Proteus species DETECTED (A) NOT DETECTED Final    Comment: CRITICAL RESULT CALLED TO, READ BACK BY AND VERIFIED WITH: PHARMD J LEGGE [id number] AT 833 AM BY CM    Salmonella species NOT DETECTED NOT DETECTED Final   Serratia marcescens NOT DETECTED NOT DETECTED Final   Haemophilus influenzae NOT DETECTED NOT DETECTED Final   Neisseria meningitidis NOT DETECTED NOT DETECTED Final   Pseudomonas aeruginosa NOT DETECTED NOT DETECTED Final   Stenotrophomonas maltophilia NOT DETECTED NOT DETECTED Final   Candida albicans NOT DETECTED NOT DETECTED Final   Candida auris NOT DETECTED NOT DETECTED Final   Candida glabrata NOT DETECTED NOT DETECTED Final   Candida krusei NOT DETECTED NOT DETECTED Final   Candida parapsilosis NOT DETECTED NOT DETECTED Final   Candida tropicalis NOT DETECTED NOT DETECTED Final   Cryptococcus neoformans/gattii NOT DETECTED NOT DETECTED Final    CTX-M ESBL NOT DETECTED NOT DETECTED Final   Carbapenem resistance IMP NOT DETECTED NOT DETECTED Final   Carbapenem resistance KPC NOT DETECTED NOT DETECTED Final  Carbapenem resistance NDM NOT DETECTED NOT DETECTED Final   Carbapenem resist OXA 48 LIKE NOT DETECTED NOT DETECTED Final   Carbapenem resistance VIM NOT DETECTED NOT DETECTED Final    Comment: Performed at [facility], [place] [address], [place], [place] [address]  MRSA Next Gen by PCR, Nasal     Status: Abnormal   Collection Time: 07/19/21 10:34 PM   Specimen: Nasal Mucosa; Nasal Swab  Result Value Ref Range Status   MRSA by PCR Next Gen DETECTED (A) NOT DETECTED Final    Comment: RESULT CALLED TO, READ BACK BY AND VERIFIED WITH: B,CLAPP AT 0122 ON 07/20/21 BY A,MOHAMED (NOTE) The GeneXpert MRSA Assay (FDA approved for NASAL specimens only), is one component of a comprehensive MRSA colonization surveillance program. It is not intended to diagnose MRSA infection nor to guide or monitor treatment for MRSA infections. Test performance is not FDA approved in patients less than 19 years old. Performed at [facility], [place] [address] [place],  [address]     Pertinent Lab.    Latest Ref Rng & Units 07/22/2021    3:13 AM 07/21/2021    1:23 AM 07/20/2021    3:03 AM  CBC  WBC 4.0 - 10.5 K/uL 9.2  7.9  8.1   Hemoglobin 13.0 - 17.0 g/dL 10.4  10.3  9.3   Hematocrit 39.0 - 52.0 % 33.9  32.7  30.4   Platelets 150 - 400 K/uL 165  146  181       Latest Ref Rng & Units 07/22/2021    3:13 AM 07/21/2021    1:23 AM 07/20/2021    8:00 PM  CMP  Glucose 70 - 99 mg/dL 83   174   BUN 6 - 20 mg/dL 45   67   Creatinine 0.61 - 1.24 mg/dL 1.13  1.47  1.67   Sodium 135 - 145 mmol/L 149   145   Potassium 3.5 - 5.1 mmol/L 3.9   4.1   Chloride 98 - 111 mmol/L 120   118   CO2 22 - 32 mmol/L 22   19   Calcium 8.9 - 10.3 mg/dL 10.0   9.9   Total Protein 6.5 - 8.1 g/dL 6.2     Total Bilirubin 0.3 - 1.2 mg/dL 0.6      Alkaline Phos 38 - 126 U/L 60     AST 15 - 41 U/L 27     ALT 0 - 44 U/L 20        Pertinent Imaging today Plain films and CT images have been personally visualized and interpreted; radiology reports have been reviewed. Decision making incorporated into the Impression / Recommendations.  No results found.   I spent 55 minutes for this patient encounter including review of prior medical records, coordination of care with primary/other specialist with greater than 50% of time being face to face/counseling and discussing diagnostics/treatment plan with the patient/family.  Electronically signed by:   Rosiland Oz, MD Infectious Disease Physician [facility] for Infectious Disease Pager: [phone]

## 2021-07-23 DIAGNOSIS — R531 Weakness: Secondary | ICD-10-CM

## 2021-07-23 DIAGNOSIS — Z7189 Other specified counseling: Secondary | ICD-10-CM

## 2021-07-23 DIAGNOSIS — Z515 Encounter for palliative care: Secondary | ICD-10-CM

## 2021-07-23 MED ORDER — MORPHINE SULFATE 20 MG/5ML PO SOLN: 5.0000 mg | ORAL | 0 refills | Status: AC | PRN

## 2021-07-23 MED ORDER — GLYCOPYRROLATE 1 MG PO TABS: 1.0000 mg | ORAL_TABLET | ORAL | 0 refills | Status: AC | PRN

## 2021-07-23 MED ORDER — SCOPOLAMINE 1 MG/3DAYS TD PT72: 1.0000 | MEDICATED_PATCH | TRANSDERMAL | 12 refills | Status: AC

## 2021-07-23 NOTE — Discharge Summary (Addendum)
Physician Discharge Summary   Patient: [name] [name] MRN: [id number] DOB: 1960-05-16  Admit date:     07/19/2021  Discharge date: 07/23/21  Discharge Physician: Elmarie Shiley   PCP: [name], MD   Recommendations at discharge:   Comfort care, transfer to residential hospice facility   Discharge Diagnoses: Active Problems:   Acute urinary retention   Human immunodeficiency virus (HIV) disease (HCC)   Acute renal failure superimposed on stage 3a chronic kidney disease (HCC)   Chronic diastolic CHF (congestive heart failure) (HCC)   Normochromic normocytic anemia   Pressure injury of skin   Protein-calorie malnutrition, severe (HCC)   Unspecified severe protein-calorie malnutrition (HCC)   Candidal endocarditis   Endocarditis of mitral valve   Osteomyelitis (HCC)   Hypoglycemia   Abnormal CT of the abdomen   Severe sepsis (HCC)  Principal Problem (Resolved):   Severe sepsis; Candidia albicans bacteremia [facility])  Hospital Course: 61 year old with past medical history significant for HIV, previous endocarditis, previous osteomyelitis, baseline debility bedbound for the last 1 to  2 years, decubitus ulcer, chronic Foley catheter, recurring urinary tract infection who presents after feeling poorly for the last 2 days prior to admission.  He reported worse back pain and muscle spasm.  He reported fever abdominal pain, nausea vomiting.  Evaluation in the ED patient received IV fluids started on empirically IV vancomycin and cefepime.  Lactic acid was more than 5.  He required IV pressors.  CT abdomen revealed moderate to advanced bilateral hydroureteronephrosis with progression from prior exam.  His Foley catheter was exchanged.  Urology was consulted and recommended IV antibiotics and supportive care, and agreed with  exchange of the Foley catheter.  Patient was found to have Proteus mirabilis bacteremia secondary to obstructed Foley catheter, septic shock related to UTI.  He  has a history of native mitral valve endocarditis status post prolonged antifungal treatment.  He is not a surgical candidate.  For the chronic sacral decubitus ulcer and chronic osteomyelitis he had increased presacral edema and phlegmon.  CCM discussed with surgery and not surgical intervention is planned.  Patient was able to be weaned off of IV pressors.     On 7/14 patient declined NG tube placement.  He subsequently had vomiting episode and aspirated, ended with acute hypoxic respiratory failure requiring high flow oxygen and nonrebreather mask.  Patient had also previously declined tube feedings.  CCM discussed with POA and decision was to proceed with full comfort care.   Continue with comfort care support and symptom management.  Assessment and Plan: 1-Sepsis secondary to Proteus bacteremia secondary to UTI: POA.  Patient presented with hypotension, lactic acidosis, leukocytosis.  UTI secondary to chronic indwelling Foley catheter malfunction. Exchange.  -Patient was treated with IV fluids, IV antibiotics broad-spectrum, IV pressors. -ID was consulted and recommended 2 weeks total of IV antibiotics. -Urology was consulted, and recommended exchange of Foley catheter IV fluids and IV antibiotics. -CCM assisted with patient care -After if either decompensation with acute hypoxic respiratory failure, patient has transition to full comfort care. he has bed at residential hospice facility, plan to transfer if family agrees.      2-Acute hypoxic respiratory failure, aspiration pneumonia: Patient had aspiration episode after vomiting, he was placed on 3 L high flow oxygen.  After discussion with family, decision was to proceed with comfort care.   3-Large sacral decubitus ulcer, chronic osteomyelitis with progression of osteomyelitis and new area of osteomyelitis: Stage 4 Sacrum.  Per Sacral MRI  7/12 CCM had phone consultation with general surgery and review of images revealed no need for  surgical intervention at this time as no abscess was seen. Continue with wound care.    4-Elevation of troponin, concerning with non-STEMI; Demand Ischemia.  He was initially treated with IV heparin but subsequently stopped after discussion with cardiology. History of mitral valve endocarditis with resultant moderate mitral valve regurgitation due to leaflet perforation Status post prolong anidulanfungin followed by lifelong voriconazole in the setting of HIV. Deemed not a surgical candidate for repair   5-Dilated common bile duct: Dilated CBD 11 mm but no filling defect visible on right upper quadrant ultrasound 7/12 -7/13; CCM had phone consultation with general surgery with no acute surgical need identified.  Case was also discussed with GI who did not recommend MRCP at this time.   6-Constipation: Ileus versus developing bowel obstruction Patient was a started on bowel regimen He declined NG tube placement He was subsequently aspirated and now has been transitioned to comfort care.   Severe protein caloric malnutrition, failure to thrive, debility -poor prognosis.  Decline tube feeding.    Oral thrush: Started on nystatin.    Postobstructive AKI; foley catheter exchange. Received IV fluids.    Hyperkalemia;resolved.    Urethral stricture, foley catheter dependent.  Metabolic acidosis; secondary to sepsis, AKI.  Hypernatremia; received IV fluids.    Please see wound care documentation below: further correction, stage IV sacrum decubitus.      Pressure Injury 07/19/21 Sacrum Medial Stage 3 -  Full thickness tissue loss. Subcutaneous fat may be visible but bone, tendon or muscle are NOT exposed. 10cm x 6cm (Active)  07/19/21 2200  Location: Sacrum  Location Orientation: Medial  Staging: Stage 3 -  Full thickness tissue loss. Subcutaneous fat may be visible but bone, tendon or muscle are NOT exposed.  Wound Description (Comments): 10cm x 6cm  Present on Admission: Yes   Dressing Type Foam - Lift dressing to assess site every shift 07/22/21 2000     Pressure Injury 07/19/21 Sacrum Left Stage 3 -  Full thickness tissue loss. Subcutaneous fat may be visible but bone, tendon or muscle are NOT exposed. 3cm x 3cm (Active)  07/19/21 2200  Location: Sacrum  Location Orientation: Left  Staging: Stage 3 -  Full thickness tissue loss. Subcutaneous fat may be visible but bone, tendon or muscle are NOT exposed.  Wound Description (Comments): 3cm x 3cm  Present on Admission: Yes  Dressing Type Foam - Lift dressing to assess site every shift 07/22/21 2000     Pressure Injury 07/19/21 Sacrum Right Stage 1 -  Intact skin with non-blanchable redness of a localized area usually over a bony prominence. 7cm x 7cm (Active)  07/19/21 2200  Location: Sacrum  Location Orientation: Right  Staging: Stage 1 -  Intact skin with non-blanchable redness of a localized area usually over a bony prominence.  Wound Description (Comments): 7cm x 7cm  Present on Admission: Yes  Dressing Type Foam - Lift dressing to assess site every shift 07/22/21 2000     Pressure Injury 07/20/21 Heel Anterior;Left Unstageable - Full thickness tissue loss in which the base of the injury is covered by slough (yellow, tan, gray, green or brown) and/or eschar (tan, brown or black) in the wound bed. unstagable, necrotic. L pl (Active)  07/20/21 1533  Location: Heel  Location Orientation: Anterior;Left  Staging: Unstageable - Full thickness tissue loss in which the base of the injury is covered by slough (yellow,  tan, gray, green or brown) and/or eschar (tan, brown or black) in the wound bed.  Wound Description (Comments): unstagable, necrotic. L plantar foot  Present on Admission: Yes  Dressing Type Foam - Lift dressing to assess site every shift 07/22/21 2000     Pressure Injury 07/20/21 Foot Anterior;Right;Lateral Stage 2 -  Partial thickness loss of dermis presenting as a shallow open injury with a red,  pink wound bed without slough. (Active)  07/20/21 1535  Location: Foot  Location Orientation: Anterior;Right;Lateral  Staging: Stage 2 -  Partial thickness loss of dermis presenting as a shallow open injury with a red, pink wound bed without slough.  Wound Description (Comments):   Present on Admission: Yes  Dressing Type Foam - Lift dressing to assess site every shift 07/22/21 2000        Nutrition Problem: Severe Malnutrition Etiology: chronic illness (HIV)       Signs/Symptoms: severe fat depletion, severe muscle depletion       Interventions: Boost Breeze, Juven, Ensure Enlive (each supplement provides 350kcal and 20 grams of protein), Refer to RD note for recommendations   Estimated body mass index is 15.06 kg/m as calculated from the following:   Height as of this encounter: '6\' 2"'$  (1.88 m).   Weight as of this encounter: 53.2 kg.          Consultants: CCM, palliative Procedures performed: None Disposition: Hospice care Diet recommendation:  Discharge Diet Orders (From admission, onward)     Start     Ordered   07/23/21 0000  Diet - low sodium heart healthy        07/23/21 1418           NPO   DISCHARGE MEDICATION: Allergies as of 07/23/2021       Reactions   Collagen Rash, Other (See Comments)   Redness and "Allergic," per [facility]        Medication List     STOP taking these medications    acetaminophen 325 MG tablet Commonly known as: TYLENOL   cyanocobalamin 100 MCG tablet   cyclobenzaprine 10 MG tablet Commonly known as: FLEXERIL   Edurant 25 MG Tabs tablet Generic drug: rilpivirine   famotidine 20 MG tablet Commonly known as: PEPCID   feeding supplement Liqd   megestrol 625 MG/5ML suspension Commonly known as: Megace ES   multivitamin with minerals Tabs tablet   nutrition supplement (JUVEN) Pack   ondansetron 4 MG tablet Commonly known as: ZOFRAN   oxycodone 5 MG capsule Commonly known as: OXY-IR   polycarbophil 625 MG  tablet Commonly known as: FIBERCON   PRESCRIPTION MEDICATION   Prezcobix 800-150 MG tablet Generic drug: darunavir-cobicistat   senna-docusate 8.6-50 MG tablet Commonly known as: Senokot-S   simethicone 80 MG chewable tablet Commonly known as: MYLICON   Tivicay 50 MG tablet Generic drug: dolutegravir   Vfend 200 MG tablet Generic drug: voriconazole   vitamin C 500 MG tablet Commonly known as: ASCORBIC ACID   zinc sulfate 220 (50 Zn) MG capsule       TAKE these medications    glycopyrrolate 1 MG tablet Commonly known as: ROBINUL Take 1 tablet (1 mg total) by mouth every 4 (four) hours as needed (excessive secretions).   morphine 20 MG/5ML solution Take 1.3 mLs (5.2 mg total) by mouth every 2 (two) hours as needed for pain.   scopolamine 1 MG/3DAYS Commonly known as: TRANSDERM-SCOP Place 1 patch (1.5 mg total) onto the skin every 3 (three) days. Start  taking on: July 25, 2021               Discharge Care Instructions  (From admission, onward)           Start     Ordered   07/23/21 0000  Discharge wound care:       Comments: See above   07/23/21 1418            Follow-up Information     Lucas Mallow, MD. Schedule an appointment as soon as possible for a visit.   Specialty: Urology Contact information: [place] [phone]                Discharge Exam: Danley Danker Weights   07/19/21 1556 07/19/21 2243  Weight: 58.1 kg 53.2 kg   General; chronic ill appearing  Condition at discharge: poor  The results of significant diagnostics from this hospitalization (including imaging, microbiology, ancillary and laboratory) are listed below for reference.   Imaging Studies: DG CHEST PORT 1 VIEW  Result Date: 07/22/2021 CLINICAL DATA:  [id number] follow-up aspiration into airway EXAM: PORTABLE CHEST 1 VIEW COMPARISON:  July 19, 2021 FINDINGS: The heart size and mediastinal contours are within normal limits.  Right lung is clear. At the left side, there is an apparent mild opacity seen at the infrahilar region extending into the lower lobe. Remainder of the lung fields are clear. The visualized skeletal structures are unremarkable. IMPRESSION: Apparent opacity at the left infrahilar region extending into the lower lobe and is suspicious for infiltrations. Electronically Signed   By: Frazier Richards M.D.   On: 07/22/2021 13:31   MR SACRUM SI JOINTS WO CONTRAST  Result Date: 07/20/2021 CLINICAL DATA:  ischium and sacrum deep necrotic wounds EXAM: MRI SACRUM WITHOUT CONTRAST TECHNIQUE: Multiplanar multi-sequence MR imaging of the sacrum was performed. No intravenous contrast was administered. COMPARISON:  None Available. FINDINGS: Urinary Tract: There is a Foley catheter in place with moderate distension of the urinary bladder and intraluminal gas. Persistent prominent of the partially visualized ureters. Bowel: There multiple loops of dilated small bowel noted in the pelvis. Vascular/Lymphatic: No pathologically enlarged lymph nodes. No significant vascular abnormality seen. Reproductive:  Unremarkable pain Other:  None Musculoskeletal: Chronic large sacral decubitus ulcer. There is continued, chronic osteomyelitis of S4 and S5 with progressive bony remodeling/erosion since the prior MRI in August 22. There is a new small area marrow edema and low T1 signal within the distal aspect of S3 (T2 sagittal image 15, coronal T1 image 9). There is increased adjacent presacral edema/phlegmon, likely non-drainable. Persistent intense intramuscular edema within the piriformis muscles and hip adductors bilaterally, likely reactive. The sacroiliac joints are unremarkable without SI joint effusion or bony erosion. No pericapsular thickening or soft tissue swelling. Partially visualized right hip arthroplasty with associated susceptibility artifact. 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 IMPRESSION: Large sacral  decubitus ulcer with chronic osteomyelitis and progressive bony remodeling/erosion of S4 and S5 in comparison to prior MRI in August 2022. New small area of osteomyelitis along the dorsal distal aspect of S3. Increased presacral edema/phlegmon. No drainable soft tissue abscess at this time. Persistent intense intramuscular edema within the piriformis and hip adductors bilaterally, likely reactive insert or myositis. Multiple dilated loops of small bowel noted. Recommend abdominal series. Electronically Signed   By: Maurine Simmering M.D.   On: 07/20/2021 21:59   MR ANKLE LEFT WO CONTRAST  Result Date: 07/20/2021 CLINICAL DATA:  necrotic wound, concern for  osteomyelitis EXAM: MRI OF THE LEFT ANKLE WITHOUT CONTRAST TECHNIQUE: Multiplanar, multisequence MR imaging of the left ankle was performed. No intravenous contrast was administered. COMPARISON:  None Available. FINDINGS: Bones/Joint/Cartilage There is no significant marrow signal alteration. The cortex is intact. Pes cavus deformity. Ligaments Extensive artifact on axial images limiting evaluation. The ATFL and PTFL appear intact. The calcaneofibular ligament is intact. The deltoid is intact. The spring ligament is intact. Muscles and Tendons Peroneal tendons are intact. Posteromedial tendons appear intact. There is mild fraying and tendinosis at the distal posterior tibial tendon insertion. The anterior tendons are intact. Achilles tendon is intact with mild insertional tendinosis. Minimal retrocalcaneal bursal fluid. There is thickening increased signal of the central bundle plantar fascia with indistinctness along the medial aspect. There is intramuscular edema and muscle atrophy throughout the foot, related to denervation change. Soft tissue There is focal intense plantar soft tissue swelling along the hindfoot, most intense medially. No evidence of soft tissue abscess. IMPRESSION: Plantar soft tissue swelling along the heel. No evidence of soft tissue abscess  or underlying osteomyelitis. Thickening and increased signal of the central bundle plantar fascia with indistinctness along the medial aspect, could reflect involvement by soft tissue infection. Electronically Signed   By: Maurine Simmering M.D.   On: 07/20/2021 21:37   US RENAL  Result Date: 07/20/2021 CLINICAL DATA:  Hydronephrosis EXAM: RENAL / URINARY TRACT ULTRASOUND COMPLETE COMPARISON:  CT done on 07/19/2021 FINDINGS: Right Kidney: Renal measurements: 10 x 5.1 x 4.6 cm = volume: 121 mL. There is no hydronephrosis. There is increased cortical echogenicity. Left Kidney: Renal measurements: 11.3 x 5.9 x 4.3 cm = volume: 136 mL. There is no hydronephrosis. Examination was technically difficult due to patient's body habitus. The slight increase in cortical echogenicity. There is no perinephric fluid collection. Bladder: Urinary bladder is empty due to indwelling Foley catheter and not adequately visualized for evaluation. Other: None. IMPRESSION: There is no hydronephrosis. There is interval resolution of bilateral hydronephrosis. There is no perinephric fluid collection. Increased cortical echogenicity may be technical artifact or suggest medical renal disease. Electronically Signed   By: Elmer Picker M.D.   On: 07/20/2021 17:03   US Abdomen Limited RUQ (LIVER/GB)  Result Date: 07/20/2021 CLINICAL DATA:  61 year old male with abdominal pain. Distended gallbladder. EXAM: ULTRASOUND ABDOMEN LIMITED RIGHT UPPER QUADRANT COMPARISON:  Noncontrast CT Abdomen and Pelvis 07/19/2021. FINDINGS: Gallbladder: Distended gallbladder with perhaps mild dependent sludge (image 4). But no gallstones identified. Wall thickness remains normal at 2 mm. No pericholecystic fluid. No sonographic Murphy sign elicited. Common bile duct: Diameter: 9-10 mm, dilated. No filling defect within the visible duct (image 20). Liver: No intrahepatic biliary ductal dilatation is evident. No discrete liver lesion. Background echogenicity  within normal limits. Portal vein is patent on color Doppler imaging with normal direction of blood flow towards the liver. Other: Hydronephrotic right kidney redemonstrated (image 37). IMPRESSION: 1. Dilated CBD (11 mm) but no filling defect in the visible duct. No intrahepatic biliary ductal dilatation. If there is hyperbilirubinemia consider choledocholithiasis or other distal obstruction. 2. Distended gallbladder, perhaps with mild sludge, but no stones or evidence of acute cholecystitis. 3. Right hydronephrosis. Electronically Signed   By: Genevie Ann M.D.   On: 07/20/2021 06:23   CT ABDOMEN PELVIS WO CONTRAST  Result Date: 07/19/2021 CLINICAL DATA:  Acute abdominal pain. Sepsis. Hypotension and abnormal labs. EXAM: CT ABDOMEN AND PELVIS WITHOUT CONTRAST TECHNIQUE: Multidetector CT imaging of the abdomen and pelvis was performed following the standard  protocol without IV contrast. RADIATION DOSE REDUCTION: This exam was performed according to the departmental dose-optimization program which includes automated exposure control, adjustment of the mA and/or kV according to patient size and/or use of iterative reconstruction technique. COMPARISON:  CT 12/16/2020 FINDINGS: Lower chest: Basilar bronchiolectasis. Hypoventilatory changes in the dependent lungs. No pleural effusion. Normal heart size with coronary artery calcifications. Hepatobiliary: No evidence of focal hepatic abnormality on this unenhanced exam. Gallbladder is distended. No calcified gallstone. No definite pericholecystic fat stranding, although detailed assessment limited by motion. Pancreas: Parenchymal atrophy. No ductal dilatation or inflammation. Spleen: Normal in size without focal abnormality. Adrenals/Urinary Tract: No adrenal nodule. Moderate to advanced bilateral hydroureteronephrosis, with progression from prior exam. Both ureters are dilated to the bladder insertion. No evidence of obstructing calculi. Mild urothelial thickening  distally. Foley catheter in the bladder, bladder is mildly distended despite appropriate Foley catheter placement. Moderate wall thickening with mild perivesicular fat stranding. There are punctate nonobstructing intrarenal stones. Stomach/Bowel: Air-fluid level in the stomach which is moderately distended. No small bowel obstruction or inflammation. Normal appendix. Moderate volume of colonic stool. Vascular/Lymphatic: Mild aortic and branch atherosclerosis. No portal venous or mesenteric gas. No bulky abdominopelvic adenopathy Reproductive: Prostate is unremarkable. Other: Small fat containing left inguinal hernia. No free air or ascites. Musculoskeletal: Sacral decubitus ulcer with resorptive changes of the sacrum and coccyx. Right hip arthroplasty. The bones are markedly under mineralized. There is presacral soft tissue thickening, no drainable abscess or collection. IMPRESSION: 1. Moderate to advanced bilateral hydroureteronephrosis, with progression from prior exam. No evidence of obstructing calculi. Bladder is mildly distended despite appropriate Foley catheter placement. Moderate bladder wall thickening with mild perivesicular fat stranding, suspicious for urinary tract infection. Recommend correlation with urinalysis. 2. Sacral decubitus ulcer with resorptive changes of the sacrum and coccyx. Progression from prior exam suggestive of osteomyelitis. Presacral soft tissue thickening without drainable abscess or collection. 3. Distended gallbladder without calcified gallstone. If there is clinical concern for acute cholecystitis, recommend right upper quadrant ultrasound. 4. Punctate nonobstructing intrarenal stones. 5. Moderate gastric distension with air-fluid level, nonspecific. Aortic Atherosclerosis (ICD10-I70.0). Electronically Signed   By: Keith Rake M.D.   On: 07/19/2021 20:52   DG Chest 2 View  Result Date: 07/19/2021 CLINICAL DATA:  Suspected Sepsis Hypotension.  Abnormal labs. EXAM: CHEST  - 2 VIEW COMPARISON:  Chest radiograph 03/17/2021, chest CT 06/22/2014 FINDINGS: The heart is normal in size. Stable mediastinal contours. No acute airspace disease. No pulmonary edema, pleural effusion, or pneumothorax. Apical blebs and scarring again seen. IMPRESSION: No acute chest findings. Electronically Signed   By: Keith Rake M.D.   On: 07/19/2021 16:59    Microbiology: Results for orders placed or performed during the hospital encounter of 07/19/21  Urine Culture     Status: Abnormal   Collection Time: 07/19/21  1:56 AM   Specimen: Urine, Clean Catch  Result Value Ref Range Status   Specimen Description   Final    URINE, CLEAN CATCH Performed at [facility], [place] [address] [place], [place] [address]    Special Requests   Final    NONE Performed at [facility], [place] [address] [place], [place] [address]    Culture >=100,000 COLONIES/mL PROTEUS MIRABILIS (A)  Final   Report Status 07/22/2021 FINAL  Final   Organism ID, Bacteria PROTEUS MIRABILIS (A)  Final      Susceptibility   Proteus mirabilis - MIC*    AMPICILLIN <=2 SENSITIVE Sensitive  CEFAZOLIN <=4 SENSITIVE Sensitive     CEFEPIME <=0.12 SENSITIVE Sensitive     CEFTRIAXONE <=0.25 SENSITIVE Sensitive     CIPROFLOXACIN <=0.25 SENSITIVE Sensitive     GENTAMICIN <=1 SENSITIVE Sensitive     IMIPENEM 2 SENSITIVE Sensitive     NITROFURANTOIN 128 RESISTANT Resistant     TRIMETH/SULFA <=20 SENSITIVE Sensitive     AMPICILLIN/SULBACTAM <=2 SENSITIVE Sensitive     PIP/TAZO <=4 SENSITIVE Sensitive     * >=100,000 COLONIES/mL PROTEUS MIRABILIS  Resp Panel by RT-PCR (Flu A&B, Covid) Urine, Catheterized     Status: None   Collection Time: 07/19/21  5:04 PM   Specimen: Urine, Catheterized; Nasal Swab  Result Value Ref Range Status   SARS Coronavirus 2 by RT PCR NEGATIVE NEGATIVE Final    Comment: (NOTE) SARS-CoV-2 target nucleic acids are NOT DETECTED.  The SARS-CoV-2 RNA is  generally detectable in upper respiratory specimens during the acute phase of infection. The lowest concentration of SARS-CoV-2 viral copies this assay can detect is 138 copies/mL. A negative result does not preclude SARS-Cov-2 infection and should not be used as the sole basis for treatment or other patient management decisions. A negative result may occur with  improper specimen collection/handling, submission of specimen other than nasopharyngeal swab, presence of viral mutation(s) within the areas targeted by this assay, and inadequate number of viral copies(<138 copies/mL). A negative result must be combined with clinical observations, patient history, and epidemiological information. The expected result is Negative.  Fact Sheet for Patients:  EntrepreneurPulse.com.au  Fact Sheet for Healthcare Providers:  IncredibleEmployment.be  This test is no t yet approved or cleared by the [place] FDA and  has been authorized for detection and/or diagnosis of SARS-CoV-2 by FDA under an Emergency Use Authorization (EUA). This EUA will remain  in effect (meaning this test can be used) for the duration of the COVID-19 declaration under Section 564(b)(1) of the Act, 21 U.S.C.section 360bbb-3(b)(1), unless the authorization is terminated  or revoked sooner.       Influenza A by PCR NEGATIVE NEGATIVE Final   Influenza B by PCR NEGATIVE NEGATIVE Final    Comment: (NOTE) The Xpert Xpress SARS-CoV-2/FLU/RSV plus assay is intended as an aid in the diagnosis of influenza from Nasopharyngeal swab specimens and should not be used as a sole basis for treatment. Nasal washings and aspirates are unacceptable for Xpert Xpress SARS-CoV-2/FLU/RSV testing.  Fact Sheet for Patients: EntrepreneurPulse.com.au  Fact Sheet for Healthcare Providers: IncredibleEmployment.be  This test is not yet approved or cleared by the [place] FDA and has been authorized for detection and/or diagnosis of SARS-CoV-2 by FDA under an Emergency Use Authorization (EUA). This EUA will remain in effect (meaning this test can be used) for the duration of the COVID-19 declaration under Section 564(b)(1) of the Act, 21 U.S.C. section 360bbb-3(b)(1), unless the authorization is terminated or revoked.  Performed at [facility], [place] [address] [place], [place] [address]   Urine Culture     Status: Abnormal   Collection Time: 07/19/21  5:04 PM   Specimen: In/Out Cath Urine  Result Value Ref Range Status   Specimen Description   Final    IN/OUT CATH URINE Performed at [place] [address], [place], [place] [address]    Special Requests   Final    NONE Performed at [facility], [place] [address], [place], [place] [address]    Culture >=100,000 COLONIES/mL PROTEUS MIRABILIS (A)  Final  Report Status 07/21/2021 FINAL  Final   Organism ID, Bacteria PROTEUS MIRABILIS (A)  Final      Susceptibility   Proteus mirabilis - MIC*    AMPICILLIN <=2 SENSITIVE Sensitive     CEFAZOLIN 8 SENSITIVE Sensitive     CEFEPIME <=0.12 SENSITIVE Sensitive     CEFTRIAXONE <=0.25 SENSITIVE Sensitive     CIPROFLOXACIN <=0.25 SENSITIVE Sensitive     GENTAMICIN <=1 SENSITIVE Sensitive     IMIPENEM 2 SENSITIVE Sensitive     NITROFURANTOIN 128 RESISTANT Resistant     TRIMETH/SULFA <=20 SENSITIVE Sensitive     AMPICILLIN/SULBACTAM <=2 SENSITIVE Sensitive     PIP/TAZO <=4 SENSITIVE Sensitive     * >=100,000 COLONIES/mL PROTEUS MIRABILIS  Culture, blood (Routine x 2)     Status: Abnormal   Collection Time: 07/19/21  5:42 PM   Specimen: BLOOD  Result Value Ref Range Status   Specimen Description   Final    BLOOD LEFT ANTECUBITAL Performed at [place] [address] [place], [place] [address]    Special Requests   Final    BOTTLES DRAWN AEROBIC AND ANAEROBIC  Blood Culture results may not be optimal due to an excessive volume of blood received in culture bottles Performed at [facility] [address] [place], [place] [address]    Culture  Setup Time   Final    GRAM NEGATIVE RODS IN BOTH AEROBIC AND ANAEROBIC BOTTLES CRITICAL RESULT CALLED TO, READ BACK BY AND VERIFIED WITH: PHARMD J LEGGE [id number] AT 833 AM BY CM Performed at [facility], [place] [address] [place], [place] [address]    Culture PROTEUS MIRABILIS (A)  Final   Report Status 07/22/2021 FINAL  Final   Organism ID, Bacteria PROTEUS MIRABILIS  Final      Susceptibility   Proteus mirabilis - MIC*    AMPICILLIN <=2 SENSITIVE Sensitive     CEFAZOLIN <=4 SENSITIVE Sensitive     CEFEPIME <=0.12 SENSITIVE Sensitive     CEFTAZIDIME <=1 SENSITIVE Sensitive     CEFTRIAXONE <=0.25 SENSITIVE Sensitive     CIPROFLOXACIN <=0.25 SENSITIVE Sensitive     GENTAMICIN <=1 SENSITIVE Sensitive     IMIPENEM 2 SENSITIVE Sensitive     TRIMETH/SULFA <=20 SENSITIVE Sensitive     AMPICILLIN/SULBACTAM <=2 SENSITIVE Sensitive     PIP/TAZO <=4 SENSITIVE Sensitive     * PROTEUS MIRABILIS  Culture, blood (Routine x 2)     Status: Abnormal   Collection Time: 07/19/21  5:42 PM   Specimen: BLOOD  Result Value Ref Range Status   Specimen Description   Final    BLOOD RIGHT ANTECUBITAL Performed at [place] [address], [place], [place] [address]    Special Requests   Final    BOTTLES DRAWN AEROBIC AND ANAEROBIC Blood Culture results may not be optimal due to an excessive volume of blood received in culture bottles Performed at [place] [address], [place], [place] [address]    Culture  Setup Time   Final    GRAM NEGATIVE RODS IN BOTH AEROBIC AND ANAEROBIC BOTTLES CRITICAL VALUE NOTED.  VALUE IS CONSISTENT WITH PREVIOUSLY REPORTED AND CALLED VALUE.    Culture (A)  Final    PROTEUS MIRABILIS SUSCEPTIBILITIES PERFORMED ON  PREVIOUS CULTURE WITHIN THE LAST 5 DAYS. Performed at [facility], [place] [address], [place], [place] [address]    Report Status 07/22/2021 FINAL  Final  Blood Culture  ID Panel (Reflexed)     Status: Abnormal   Collection Time: 07/19/21  5:42 PM  Result Value Ref Range Status   Enterococcus faecalis NOT DETECTED NOT DETECTED Final   Enterococcus Faecium NOT DETECTED NOT DETECTED Final   Listeria monocytogenes NOT DETECTED NOT DETECTED Final   Staphylococcus species NOT DETECTED NOT DETECTED Final   Staphylococcus aureus (BCID) NOT DETECTED NOT DETECTED Final   Staphylococcus epidermidis NOT DETECTED NOT DETECTED Final   Staphylococcus lugdunensis NOT DETECTED NOT DETECTED Final   Streptococcus species NOT DETECTED NOT DETECTED Final   Streptococcus agalactiae NOT DETECTED NOT DETECTED Final   Streptococcus pneumoniae NOT DETECTED NOT DETECTED Final   Streptococcus pyogenes NOT DETECTED NOT DETECTED Final   A.calcoaceticus-baumannii NOT DETECTED NOT DETECTED Final   Bacteroides fragilis NOT DETECTED NOT DETECTED Final   Enterobacterales DETECTED (A) NOT DETECTED Final    Comment: Enterobacterales represent a large order of gram negative bacteria, not a single organism. CRITICAL RESULT CALLED TO, READ BACK BY AND VERIFIED WITH: PHARMD J LEGGE [id number] AT 29 AM BY CM    Enterobacter cloacae complex NOT DETECTED NOT DETECTED Final   Escherichia coli NOT DETECTED NOT DETECTED Final   Klebsiella aerogenes NOT DETECTED NOT DETECTED Final   Klebsiella oxytoca NOT DETECTED NOT DETECTED Final   Klebsiella pneumoniae NOT DETECTED NOT DETECTED Final   Proteus species DETECTED (A) NOT DETECTED Final    Comment: CRITICAL RESULT CALLED TO, READ BACK BY AND VERIFIED WITH: PHARMD J LEGGE [id number] AT 833 AM BY CM    Salmonella species NOT DETECTED NOT DETECTED Final   Serratia marcescens NOT DETECTED NOT DETECTED Final   Haemophilus influenzae NOT DETECTED NOT DETECTED Final   Neisseria  meningitidis NOT DETECTED NOT DETECTED Final   Pseudomonas aeruginosa NOT DETECTED NOT DETECTED Final   Stenotrophomonas maltophilia NOT DETECTED NOT DETECTED Final   Candida albicans NOT DETECTED NOT DETECTED Final   Candida auris NOT DETECTED NOT DETECTED Final   Candida glabrata NOT DETECTED NOT DETECTED Final   Candida krusei NOT DETECTED NOT DETECTED Final   Candida parapsilosis NOT DETECTED NOT DETECTED Final   Candida tropicalis NOT DETECTED NOT DETECTED Final   Cryptococcus neoformans/gattii NOT DETECTED NOT DETECTED Final   CTX-M ESBL NOT DETECTED NOT DETECTED Final   Carbapenem resistance IMP NOT DETECTED NOT DETECTED Final   Carbapenem resistance KPC NOT DETECTED NOT DETECTED Final   Carbapenem resistance NDM NOT DETECTED NOT DETECTED Final   Carbapenem resist OXA 48 LIKE NOT DETECTED NOT DETECTED Final   Carbapenem resistance VIM NOT DETECTED NOT DETECTED Final    Comment: Performed at [facility], [address], [place], [place] [address]  MRSA Next Gen by PCR, Nasal     Status: Abnormal   Collection Time: 07/19/21 10:34 PM   Specimen: Nasal Mucosa; Nasal Swab  Result Value Ref Range Status   MRSA by PCR Next Gen DETECTED (A) NOT DETECTED Final    Comment: RESULT CALLED TO, READ BACK BY AND VERIFIED WITH: B,CLAPP AT 0122 ON 07/20/21 BY A,MOHAMED (NOTE) The GeneXpert MRSA Assay (FDA approved for NASAL specimens only), is one component of a comprehensive MRSA colonization surveillance program. It is not intended to diagnose MRSA infection nor to guide or monitor treatment for MRSA infections. Test performance is not FDA approved in patients less than 53 years old. Performed at [facility], [place] [address], [place], [place] [address]     Labs: CBC: Recent Labs  Lab 07/19/21  1557 07/20/21 0303 07/21/21 0123 07/22/21 0313  WBC 12.2* 8.1 7.9 9.2  NEUTROABS 11.5*  --   --   --   HGB 13.9 9.3* 10.3* 10.4*  HCT 45.0 30.4* 32.7* 33.9*  MCV  101.4* 101.7* 100.0 100.6*  PLT 301 181 146* 329   Basic Metabolic Panel: Recent Labs  Lab 07/19/21 1921 07/20/21 0303 07/20/21 0827 07/20/21 1318 07/20/21 2000 07/20/21 2247 07/21/21 0123 07/22/21 0313  NA 143 145  144 144 142 145  --   --  149*  K 5.0 4.3  4.3 4.0 3.5 4.1  --   --  3.9  CL 120* 120*  121* 120* 121* 118*  --   --  120*  CO2 13* 15*  15* 16* 13* 19*  --   --  22  GLUCOSE 63* 71  72 112* 145* 174*  --   --  83  BUN 112* 91*  93* 84* 75* 67*  --   --  45*  CREATININE 2.43* 1.97*  1.82* 1.80* 1.66* 1.67*  --  1.47* 1.13  CALCIUM 9.7 9.4  9.2 9.4 9.5 9.9  --   --  10.0  MG 1.9  --   --   --   --  1.7  --   --   PHOS 3.6  --   --   --   --   --   --   --    Liver Function Tests: Recent Labs  Lab 07/19/21 1557 07/20/21 0303 07/22/21 0313  AST 40 '29  29  29 27  '$ ALT '21 16  16  14 20  '$ ALKPHOS 64 42  42  44 60  BILITOT 1.5* 1.2  1.0  1.1 0.6  PROT 8.0 5.2*  5.3*  5.4* 6.2*  ALBUMIN 3.2* 2.2*  2.2*  2.3* 2.4*   CBG: Recent Labs  Lab 07/21/21 2321 07/22/21 0312 07/22/21 0756 07/22/21 1159 07/22/21 1630  GLUCAP 111* 127* 118* 129* 79    Discharge time spent: greater than 30 minutes.  Signed: Elmarie Shiley, MD [facility] 07/23/2021

## 2021-07-23 NOTE — Final Progress Note (Signed)
Contacted [facility] for patient transport. [facility] arrived and patient tx to stretcher. Pt provided with dose of dilaudid to help with comfort during transport.   Patient's sister [name] [name] notified of patient's tx departure. Sister verbalized understanding and will meet transport at [facility].   [place] notified of transports arrival and patient's pending departure. Update given to [place], RN at [facility].   All paperwork secured, including patient's DNR, face sheet, medical necessity, prescriptions and medical problem list and other test results and discharge summary.   Patient confused at time of transport, asking for his wheelchair. Patient did verbalize understanding that he was being transported, patient slid to EMS stretcher without event. IVs and foley secured for transport.

## 2021-07-23 NOTE — Progress Notes (Signed)
PT Cancellation Note  Patient Details Name: [name] [name] MRN: [id number] DOB: 05-Dec-1960   Cancelled Treatment:    Reason Eval/Treat Not Completed: PT screened, no needs identified, will sign off  Patient from [place] and total care, previously screened by [place] [facility] [phone] Weekend pager-[phone]  [name] 07/23/2021, 7:44 AM

## 2021-07-23 NOTE — TOC Transition Note (Addendum)
Transition of Care [facility]) - CM/SW Discharge Note   Patient Details  Name: [name] [name] MRN: [id number] Date of Birth: May 25, 1960  [facility] CM/SW Contact:  Ross Ludwig, LCSW Phone Number: 07/23/2021, 3:00 PM   Clinical Narrative:    Patient is a LTC resident at [facility].  CSW was informed that patient will need hospice placement at [facility].  Patient's sister chose [employer].  Patient to be d/c'ed today to [facility].  Patient and family agreeable to plans will transport via ems RN to call report to [phone].  Patient's sister [name] was contacted and made aware that he will be discharging today.     Final next level of care: [place] Barriers to Discharge: Barriers Resolved   Patient Goals and CMS Choice Patient states their goals for this hospitalization and ongoing recovery are:: Family want's patient to go to [facility] for end of life care. CMS Medicare.gov Compare Post Acute Care list provided to:: Patient Represenative (must comment) Choice offered to / list presented to : Sibling  Discharge Placement              Patient chooses bed at: Other - please specify in the comment section below: ([facility]) Patient to be transferred to facility by: [facility] Name of family member notified: Sister [name] Patient and family notified of of transfer: 07/23/21  Discharge Plan and Services In-house Referral: Clinical Social Work                                   Social Determinants of Health (SDOH) Interventions     Readmission Risk Interventions    12/23/2020    1:52 PM 05/28/2020   10:58 AM  Readmission Risk Prevention Plan  Transportation Screening Complete Complete  PCP or Specialist Appt within 3-5 Days  Complete  HRI or [place]  Complete  Social Work Consult for [place] Planning/Counseling  Complete  Palliative Care Screening  Complete  Medication Review Human resources officer) Complete Complete  PCP or Specialist appointment within 3-5 days of discharge Complete   HRI or [place] Complete   SW Recovery Care/Counseling Consult Complete   Palliative Care Screening Not [place] Complete

## 2021-07-23 NOTE — Progress Notes (Signed)
OT Cancellation Note  Patient Details Name: KOSTA SCHNITZLER MRN: [id number] DOB: 1960-10-03   Cancelled Treatment:    Reason Eval/Treat Not Completed: OT screened, no needs identified, will sign off. Received another order for OT consult. Patient requires total care at baseline and is bed bound and patient without rehab potential. Patient made comfort care yesterday late afternoon. OT signing off.  Nicolo Tomko L Trinnity Breunig 07/23/2021, 7:39 AM

## 2021-07-23 NOTE — Consult Note (Signed)
Consultation Note Date: 07/23/2021   Patient Name: [name] [name]  DOB: 09-08-1960  MRN: [id number]  Age / Sex: 61 y.o., male  PCP: [name], MD Referring Physician: Elmarie Shiley, MD  Reason for Consultation: Terminal Care  HPI/Patient Profile: 61 y.o. male    admitted on 07/19/2021   Clinical Assessment and Goals of Care: 61 year old gentleman admitted to hospital medicine service with the infectious disease specialist as well as critical care specialist following, admitted for Proteus mirabilis bacteremia secondary to obstructed Foley catheter and urinary tract infection.  Has been also diagnosed with native valve, mitral valve endocarditis, also with chronic sacral decubitus ulcer, chronic osteomyelitis.  Has left plantar foot necrotic ulcer.  Has underlying history of HIV and follows with [name]. On 07-22-2021 the patient was noted to have had a vomiting episode with gross aspiration resulting in need for high flow nasal cannula as well as nonrebreather mask.  Discussions were held by pulmonary critical care service and patient's family and the decision was made to proceed with comfort measures only. Palliative medicine consultation for continuation of terminal care, comfort measures only and for additional support with addition of hospice services has been requested. Patient is resting in bed.  He has a facemask.  Sister [name] and her husband present at bedside.  Palliative medicine is specialized medical care for people living with serious illness. It focuses on providing relief from the symptoms and stress of a serious illness. The goal is to improve quality of life for both the patient and the family. Goals of care: Broad aims of medical therapy in relation to the patient's values and preferences. Our aim is to provide medical care aimed at enabling patients to achieve the goals that  matter most to them, given the circumstances of their particular medical situation and their constraints.   I discussed with sister present at bedside.  End-of-life signs and symptoms discussed.  Offered active listening supportive presence and other therapeutic empathic techniques at the time of this initial consultation.  Discussed about hospice philosophy of care, specifically the type of care that can be provided in a residential hospice setting.  All of their questions and concerns addressed to the best of my ability.  See recommendations below.  Thank you for the consult.  HCPOA Sister [name] at bedside  [place] with DNR Agree with comfort measures Recommend residential hospice End-of-life signs and symptoms discussed with family present at bedside [facility] consult Chaplain consult if desired by family  Code Status/Advance Care Planning: DNR   Symptom Management:     Palliative Prophylaxis:  Delirium Protocol  Additional Recommendations (Limitations, Scope, Preferences): Full Comfort Care  Psycho-social/Spiritual:  Desire for further Chaplaincy support:yes Additional Recommendations: Education on Hospice  Prognosis:  Hours - Days  Discharge Planning: Hospice facility      Primary Diagnoses: Present on Admission:  (Resolved) Severe sepsis; Candidia albicans bacteremia ([place])  Human immunodeficiency virus (HIV) disease ([place])  Chronic diastolic CHF (congestive heart failure) ([place])  Normochromic normocytic anemia  Pressure injury of skin  Protein-calorie malnutrition, severe (HCC)  Endocarditis of mitral valve  Acute urinary retention  Acute renal failure superimposed on stage 3a chronic kidney disease (HCC)  Hypoglycemia  Osteomyelitis (HCC)  Unspecified severe protein-calorie malnutrition (HCC)  Candidal endocarditis  Abnormal CT of the abdomen  Severe sepsis ([place])   I have reviewed the medical record, interviewed the patient and  family, and examined the patient. The following aspects are pertinent.  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    HIV (human immunodeficiency virus infection) ([place])    Hx of sepsis 2015   affected rt hip / femur   Pain in limb 07/23/2013   Pain in limb 07/23/2013   PONV (postoperative nausea and vomiting)    Hiccups for 4 days after anesthesia   Radiation    Rectal cancer (HCC)    Squamous cell   Severe sepsis; Candidia albicans bacteremia ([place]) 05/13/2020   Shingles    left shoulder, right leg   Thrombocytopenia ([place]) 12/21/2020   Social History   Socioeconomic History   Marital status: Single    Spouse name: Not on file   Number of children: 0   Years of education: BA   Highest education level: Not on file  Occupational History   Occupation: disabled    Employer: [employer]  Tobacco Use   Smoking status: Former    Packs/day: 0.30    Types: Cigarettes   Smokeless tobacco: Never  Vaping Use   Vaping Use: Never used  Substance and Sexual Activity   Alcohol use: Not Currently    Alcohol/week: 3.0 standard drinks of alcohol    Types: 3 Standard drinks or equivalent per week   Drug use: Not Currently    Types: Marijuana    Comment: occ.   Sexual activity: Never    Partners: Male    Comment: declined condoms  Other Topics Concern   Not on file  Social History Narrative   Not on file   Social Determinants of Health   Financial Resource Strain: Not on file  Food Insecurity: Not on file  Transportation Needs: Not on file  Physical Activity: Not on file  Stress: Not on file  Social Connections: Not on file    Family History  Problem Relation Age of Onset   Diabetes Mother    Dementia Mother    Atrial fibrillation Mother    Hypertension Mother    Cancer Father    Emphysema Father    Scheduled Meds:  Chlorhexidine Gluconate Cloth  6 each Topical Daily   Gerhardt's butt cream   Topical BID   mouth rinse  15 mL Mouth Rinse 4 times per day   scopolamine  1 patch Transdermal Q72H   Continuous Infusions:  sodium chloride Stopped (07/22/21 2126)   PRN Meds:.acetaminophen **OR** acetaminophen, glycopyrrolate **OR** glycopyrrolate **OR** glycopyrrolate, haloperidol lactate, HYDROmorphone (DILAUDID) injection, LORazepam, metoCLOPramide (REGLAN) injection, ondansetron (ZOFRAN) IV, mouth rinse, polyvinyl alcohol Medications Prior to Admission:  Prior to Admission medications  Medication Sig Start Date End Date Taking? Authorizing Provider  acetaminophen (TYLENOL) 325 MG tablet Take 2 tablets (650 mg total) by mouth every 6 (six) hours as needed for mild pain (or Fever >/= 101). 12/23/20  Yes Sheikh, Omair Latif, DO  cyanocobalamin 100 MCG tablet Take 100 mcg by mouth daily.   Yes [provider]  cyclobenzaprine (FLEXERIL) 10 MG tablet Take 10 mg by mouth at bedtime. 02/09/21  Yes [provider]  darunavir-cobicistat (PREZCOBIX) 800-150 MG tablet TAKE 1 TABLET BY MOUTH DAILY . SWALLOW WHOLE DO NOT CRUSH, BREAK OR CHEW TABLETS WITH FOOD. Patient taking differently: Take 1 tablet by mouth daily with breakfast. SWALLOW WHOLE DO NOT CRUSH, BREAK OR CHEW TABLETS WITH FOOD. 06/09/21  Yes [name], MD  dolutegravir (TIVICAY) 50 MG tablet Take 1 tablet (50 mg total) by mouth daily. 05/13/21  Yes [name], MD  famotidine (PEPCID) 20 MG tablet Take 1 tablet (20 mg total) by mouth daily. 12/23/20  Yes Sheikh, Omair Latif, DO  megestrol (MEGACE ES) 625 MG/5ML suspension Take 5 mLs (625 mg total) by mouth daily. 06/09/21  Yes [name], MD  Multiple Vitamin (MULTIVITAMIN  WITH MINERALS) TABS tablet Take 1 tablet by mouth daily. 05/24/21  Yes Hongalgi, Lenis Dickinson, MD  nutrition supplement, JUVEN, (JUVEN) PACK Take 1 packet by mouth 3 (three) times daily.   Yes [provider]  oxycodone (OXY-IR) 5 MG capsule Take 1 capsule (5 mg total) by mouth See admin instructions. Give '5mg'$  by mouth daily for wound care. May also give '5mg'$  every 6 hours as needed for chronic pain Patient taking differently: Take 5 mg by mouth See admin instructions. Take 5 mg by mouth every morning and an additional 5 mg every six hours as needed for severe pain 03/23/21  Yes [place] Donnamarie Poag, DO  polycarbophil (FIBERCON) 625 MG tablet Take 625 mg by mouth daily.   Yes [provider]  PRESCRIPTION MEDICATION Inject 75 mL/hr into the vein See admin instructions. Dextrose-NaCl solution 5-0.9%: 75 ml/hr via IV every day and night shift for hydration 07/19/21  Yes [provider]  rilpivirine (EDURANT) 25 MG TABS tablet TAKE 1 TABLET BY MOUTH DAILY WITH BREAKFAST Patient taking differently: Take 25 mg by mouth daily with breakfast. 06/09/21  Yes [name], MD  senna-docusate (SENOKOT-S) 8.6-50 MG tablet Take 1 tablet by mouth 2 (two) times daily. 05/24/21  Yes Hongalgi, Lenis Dickinson, MD  simethicone (MYLICON) 80 MG chewable tablet Chew 80 mg by mouth with breakfast, with lunch, and with evening meal.   Yes [provider]  VFEND 200 MG tablet Take 1 tablet (200 mg total) by mouth 2 (two) times daily. 05/12/21  Yes [name], MD  vitamin C (ASCORBIC ACID) 500 MG tablet Take 1,000 mg by mouth daily.   Yes [provider]  zinc sulfate 220 (50 Zn) MG capsule Take 1 capsule (220 mg total) by mouth daily. 12/24/20  Yes Sheikh, [place], DO  feeding supplement (ENSURE ENLIVE / ENSURE PLUS) LIQD Take 237 mLs by mouth 3 (three) times daily between meals. Patient not taking: Reported on 07/19/2021 05/24/21   Modena Jansky, MD   Allergies  Allergen Reactions    Collagen Rash and Other (See Comments)    Redness and "Allergic," per [facility]   Review of Systems Nonverbal Physical Exam Nonverbal, unresponsive Resting in bed Shallow breath sounds, some use of accessory muscles of respiration Coolness and mottling noted peripherally  in the toes Abdomen not distended  Vital Signs: BP 105/80   Pulse 90   Temp 98.8 F (37.1 C) (Axillary)   Resp 14   Ht '6\' 2"'$  (1.88 m)   Wt 53.2 kg   SpO2 96%   BMI 15.06 kg/m  Pain Scale: 0-10   Pain Score: Asleep   SpO2: SpO2: 96 % O2 Device:SpO2: 96 % O2 Flow Rate: .O2 Flow Rate (L/min): 30 L/min  IO: Intake/output summary:  Intake/Output Summary (Last 24 hours) at 07/23/2021 1038 Last data filed at 07/23/2021 0700 Gross per 24 hour  Intake 2652 ml  Output 2130 ml  Net 522 ml    LBM: Last BM Date : 07/22/21 (smears) Baseline Weight: Weight: 58.1 kg Most recent weight: Weight: 53.2 kg     Palliative Assessment/Data:   PPS 20%  Time In:  9.30 Time Out:  10.30 Time Total:  60  Greater than 50%  of this time was spent counseling and coordinating care related to the above assessment and plan.  Signed by: Loistine Chance, MD   Please contact Palliative Medicine Team phone at [phone] for questions and concerns.  For individual provider: See Shea Evans

## 2021-07-23 NOTE — Progress Notes (Signed)
SLP Cancellation Note  Patient Details Name: [name] [name] MRN: [id number] DOB: 1960-06-18   Cancelled treatment:   Pt is D/Cing to [facility] today. Our service will respectfully sign off.  Malu Pellegrini L. Tivis Ringer, MA CCC/SLP Clinical Specialist - Acute Care SLP Acute Rehabilitation Services Office number [phone]         Juan Quam Laurice 07/23/2021, 4:33 PM

## 2021-07-23 NOTE — Plan of Care (Signed)
Patient discharged to hospice for palliative and end of life care.

## 2021-07-23 NOTE — Progress Notes (Signed)
[facility]) Hospice hospital liaison note  Patient is referred for [facility]. Met with patient and family in room. Education provided regarding hospice philosophy and plan of care.   Patient is approved to come to [facility] and a bed will be available this evening.   Please send signed DNR along with patient and leave any IV's intact.   Nurse may call report at any time to [phone], room is assigned at that time.   Thank you for the opportunity to participate in this patient's care.  Jhonnie Garner, BSN, Therapist, sports, [facility] Hospice hospital liaison [phone]

## 2021-07-23 NOTE — Progress Notes (Signed)
PROGRESS NOTE    [name] [name]  KZS:[id number] DOB: 1960-05-19 DOA: 07/19/2021 PCP: [name], MD   Brief Narrative: 61 year old with past medical history significant for HIV, previous endocarditis, previous osteomyelitis, baseline debility bedbound for the last 1 to  2 years, decubitus ulcer, chronic Foley catheter, recurring urinary tract infection who presents after feeling poorly for the last 2 days prior to admission.  He reported worse back pain and muscle spasm.  He reported fever abdominal pain, nausea vomiting.  Evaluation in the ED patient received IV fluids started on empirically IV vancomycin and cefepime.  Lactic acid was more than 5.  He required IV pressors.  CT abdomen revealed moderate to advanced bilateral hydroureteronephrosis with progression from prior exam.  His Foley catheter was exchanged.  Urology was consulted and recommended IV antibiotics and supportive care, and agreed with  exchange of the Foley catheter.  Patient was found to have Proteus mirabilis bacteremia secondary to obstructed Foley catheter, septic shock related to UTI.  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  He is not a surgical candidate.  For the chronic sacral decubitus ulcer and chronic osteomyelitis he had increased presacral edema and phlegmon.  CCM discussed with surgery and not surgical intervention is planned.  Patient was able to be weaned off of IV pressors.    On 7/14 patient declined NG tube placement.  He subsequently had vomiting episode and aspirated, ended with acute hypoxic respiratory failure requiring high flow oxygen and nonrebreather mask.  Patient had also previously declined tube feedings.  CCM discussed with POA and decision was to proceed with full comfort care.  Continue with comfort care support and symptom management.   Assessment & Plan:   Active Problems:   Acute urinary retention   Human immunodeficiency virus  (HIV) disease (HCC)   Acute renal failure superimposed on stage 3a chronic kidney disease (HCC)   Chronic diastolic CHF (congestive heart failure) (HCC)   Normochromic normocytic anemia   Pressure injury of skin   Protein-calorie malnutrition, severe (HCC)   Unspecified severe protein-calorie malnutrition (HCC)   Candidal endocarditis   Endocarditis of mitral valve   Osteomyelitis (HCC)   Hypoglycemia   Abnormal CT of the abdomen   Severe sepsis (HCC)  1-Sepsis secondary to Proteus bacteremia secondary to UTI: POA.  Patient presented with hypotension, lactic acidosis, leukocytosis.  UTI secondary to chronic indwelling Foley catheter malfunction. Exchange.  -Patient was treated with IV fluids, IV antibiotics broad-spectrum, IV pressors. -ID was consulted and recommended 2 weeks total of IV antibiotics. -Urology was consulted, and recommended exchange of Foley catheter IV fluids and IV antibiotics. -CCM assisted with patient care -After if either decompensation with acute hypoxic respiratory failure, patient has transition to full comfort care. Expect  hospital death or if he feels there is stabilized may be able to be transferred to residential hospice.   2-Acute hypoxic respiratory failure, aspiration pneumonia: Patient had aspiration episode after vomiting, he was placed on 3 L high flow oxygen.  After discussion with family, decision was to proceed with comfort care.  3-Large sacral decubitus ulcer, chronic osteomyelitis with progression of osteomyelitis and new area of osteomyelitis: Stage 4 Sacrum.  Per Sacral MRI 7/12 CCM had phone consultation with general surgery and review of images revealed no need for surgical intervention at this time as no abscess was seen. Continue with wound care.   4-Elevation of troponin, concerning with non-STEMI; Demand Ischemia.  He was  initially treated with IV heparin but subsequently stopped after discussion with cardiology. History of mitral  valve endocarditis with resultant moderate mitral valve regurgitation due to leaflet perforation Status post prolong anidulanfungin followed by lifelong voriconazole in the setting of HIV. Deemed not a surgical candidate for repair  5-Dilated common bile duct: Dilated CBD 11 mm but no filling defect visible on right upper quadrant ultrasound 7/12 -7/13; CCM had phone consultation with general surgery with no acute surgical need identified.  Case was also discussed with GI who did not recommend MRCP at this time.  6-Constipation: Ileus versus developing bowel obstruction Patient was a started on bowel regimen He declined NG tube placement He was subsequently aspirated and now has been transitioned to comfort care.  Severe protein caloric malnutrition, failure to thrive, debility -poor prognosis.  Decline tube feeding.   Oral thrush: Started on nystatin.   Postobstructive AKI; foley catheter exchange. Received IV fluids.   Hyperkalemia;resolved.   Urethral stricture, foley catheter dependent.  Metabolic acidosis; secondary to sepsis, AKI.  Hypernatremia; received IV fluids.   Please see wound care documentation below: further correction, stage IV sacrum decubitus.  Pressure Injury 07/19/21 Sacrum Medial Stage 3 -  Full thickness tissue loss. Subcutaneous fat may be visible but bone, tendon or muscle are NOT exposed. 10cm x 6cm (Active)  07/19/21 2200  Location: Sacrum  Location Orientation: Medial  Staging: Stage 3 -  Full thickness tissue loss. Subcutaneous fat may be visible but bone, tendon or muscle are NOT exposed.  Wound Description (Comments): 10cm x 6cm  Present on Admission: Yes  Dressing Type Foam - Lift dressing to assess site every shift 07/22/21 2000     Pressure Injury 07/19/21 Sacrum Left Stage 3 -  Full thickness tissue loss. Subcutaneous fat may be visible but bone, tendon or muscle are NOT exposed. 3cm x 3cm (Active)  07/19/21 2200  Location: Sacrum  Location  Orientation: Left  Staging: Stage 3 -  Full thickness tissue loss. Subcutaneous fat may be visible but bone, tendon or muscle are NOT exposed.  Wound Description (Comments): 3cm x 3cm  Present on Admission: Yes  Dressing Type Foam - Lift dressing to assess site every shift 07/22/21 2000     Pressure Injury 07/19/21 Sacrum Right Stage 1 -  Intact skin with non-blanchable redness of a localized area usually over a bony prominence. 7cm x 7cm (Active)  07/19/21 2200  Location: Sacrum  Location Orientation: Right  Staging: Stage 1 -  Intact skin with non-blanchable redness of a localized area usually over a bony prominence.  Wound Description (Comments): 7cm x 7cm  Present on Admission: Yes  Dressing Type Foam - Lift dressing to assess site every shift 07/22/21 2000     Pressure Injury 07/20/21 Heel Anterior;Left Unstageable - Full thickness tissue loss in which the base of the injury is covered by slough (yellow, tan, gray, green or brown) and/or eschar (tan, brown or black) in the wound bed. unstagable, necrotic. L pl (Active)  07/20/21 1533  Location: Heel  Location Orientation: Anterior;Left  Staging: Unstageable - Full thickness tissue loss in which the base of the injury is covered by slough (yellow, tan, gray, green or brown) and/or eschar (tan, brown or black) in the wound bed.  Wound Description (Comments): unstagable, necrotic. L plantar foot  Present on Admission: Yes  Dressing Type Foam - Lift dressing to assess site every shift 07/22/21 2000     Pressure Injury 07/20/21 Foot Anterior;Right;Lateral Stage 2 -  Partial thickness loss of dermis presenting as a shallow open injury with a red, pink wound bed without slough. (Active)  07/20/21 1535  Location: Foot  Location Orientation: Anterior;Right;Lateral  Staging: Stage 2 -  Partial thickness loss of dermis presenting as a shallow open injury with a red, pink wound bed without slough.  Wound Description (Comments):   Present on  Admission: Yes  Dressing Type Foam - Lift dressing to assess site every shift 07/22/21 2000     Nutrition Problem: Severe Malnutrition Etiology: chronic illness (HIV)    Signs/Symptoms: severe fat depletion, severe muscle depletion    Interventions: Boost Breeze, Juven, Ensure Enlive (each supplement provides 350kcal and 20 grams of protein), Refer to RD note for recommendations  Estimated body mass index is 15.06 kg/m as calculated from the following:   Height as of this encounter: '6\' 2"'$  (1.88 m).   Weight as of this encounter: 53.2 kg.   DVT prophylaxis: now comfort care.  Code Status: DNR Family Communication:sister at bedside.  Disposition Plan:  Status is: Inpatient Remains inpatient appropriate because: expect hospital death.     Consultants:  CCM Surgery Urology ID  Procedures:    Antimicrobials:    Subjective: He is alert, he report abdominal pain. He relates he does not wants Ng tube.   Objective: Vitals:   07/23/21 0000 07/23/21 0300 07/23/21 0400 07/23/21 0500  BP:      Pulse: 96 90 89 90  Resp: '13 11 12 14  '$ Temp:      TempSrc:      SpO2: 90% 95% 93% 96%  Weight:      Height:        Intake/Output Summary (Last 24 hours) at 07/23/2021 0647 Last data filed at 07/23/2021 0425 Gross per 24 hour  Intake 2652 ml  Output 1650 ml  Net 1002 ml   Filed Weights   07/19/21 1556 07/19/21 2243  Weight: 58.1 kg 53.2 kg    Examination:  General exam: Chronic ill appearing Respiratory system: BL ronchus Cardiovascular system: S1 & S2 heard, RRR.  Gastrointestinal system: Abdomen is distend, tender.  Central nervous system: Alert Extremities: Symmetric 5 x 5 power.   Data Reviewed: I have personally reviewed following labs and imaging studies  CBC: Recent Labs  Lab 07/19/21 1557 07/20/21 0303 07/21/21 0123 07/22/21 0313  WBC 12.2* 8.1 7.9 9.2  NEUTROABS 11.5*  --   --   --   HGB 13.9 9.3* 10.3* 10.4*  HCT 45.0 30.4* 32.7* 33.9*  MCV  101.4* 101.7* 100.0 100.6*  PLT 301 181 146* 854   Basic Metabolic Panel: Recent Labs  Lab 07/19/21 1921 07/20/21 0303 07/20/21 0827 07/20/21 1318 07/20/21 2000 07/20/21 2247 07/21/21 0123 07/22/21 0313  NA 143 145  144 144 142 145  --   --  149*  K 5.0 4.3  4.3 4.0 3.5 4.1  --   --  3.9  CL 120* 120*  121* 120* 121* 118*  --   --  120*  CO2 13* 15*  15* 16* 13* 19*  --   --  22  GLUCOSE 63* 71  72 112* 145* 174*  --   --  83  BUN 112* 91*  93* 84* 75* 67*  --   --  45*  CREATININE 2.43* 1.97*  1.82* 1.80* 1.66* 1.67*  --  1.47* 1.13  CALCIUM 9.7 9.4  9.2 9.4 9.5 9.9  --   --  10.0  MG 1.9  --   --   --   --  1.7  --   --   PHOS 3.6  --   --   --   --   --   --   --    GFR: Estimated Creatinine Clearance: 53 mL/min (by C-G formula based on SCr of 1.13 mg/dL). Liver Function Tests: Recent Labs  Lab 07/19/21 1557 07/20/21 0303 07/22/21 0313  AST 40 '29  29  29 27  '$ ALT '21 16  16  14 20  '$ ALKPHOS 64 42  42  44 60  BILITOT 1.5* 1.2  1.0  1.1 0.6  PROT 8.0 5.2*  5.3*  5.4* 6.2*  ALBUMIN 3.2* 2.2*  2.2*  2.3* 2.4*   No results for input(s): "LIPASE", "AMYLASE" in the last 168 hours. No results for input(s): "AMMONIA" in the last 168 hours. Coagulation Profile: Recent Labs  Lab 07/19/21 1557  INR 1.5*   Cardiac Enzymes: Recent Labs  Lab 07/19/21 1921  CKTOTAL 401*   BNP (last 3 results) No results for input(s): "PROBNP" in the last 8760 hours. HbA1C: No results for input(s): "HGBA1C" in the last 72 hours. CBG: Recent Labs  Lab 07/21/21 2321 07/22/21 0312 07/22/21 0756 07/22/21 1159 07/22/21 1630  GLUCAP 111* 127* 118* 129* 79   Lipid Profile: No results for input(s): "CHOL", "HDL", "LDLCALC", "TRIG", "CHOLHDL", "LDLDIRECT" in the last 72 hours. Thyroid Function Tests: No results for input(s): "TSH", "T4TOTAL", "FREET4", "T3FREE", "THYROIDAB" in the last 72 hours. Anemia Panel: No results for input(s): "VITAMINB12", "FOLATE",  "FERRITIN", "TIBC", "IRON", "RETICCTPCT" in the last 72 hours. Sepsis Labs: Recent Labs  Lab 07/19/21 1557 07/19/21 1907 07/19/21 1921 07/20/21 0015 07/20/21 0303  PROCALCITON  --   --  20.40  --   --   LATICACIDVEN 5.0* 3.6*  --  4.9* 2.4*    Recent Results (from the past 240 hour(s))  Urine Culture     Status: Abnormal   Collection Time: 07/19/21  1:56 AM   Specimen: Urine, Clean Catch  Result Value Ref Range Status   Specimen Description   Final    URINE, CLEAN CATCH Performed at [facility], [place] [address], [place], [place] [address]    Special Requests   Final    NONE Performed at [facility], [place] [address], [place], [place] [address]    Culture >=100,000 COLONIES/mL PROTEUS MIRABILIS (A)  Final   Report Status 07/22/2021 FINAL  Final   Organism ID, Bacteria PROTEUS MIRABILIS (A)  Final      Susceptibility   Proteus mirabilis - MIC*    AMPICILLIN <=2 SENSITIVE Sensitive     CEFAZOLIN <=4 SENSITIVE Sensitive     CEFEPIME <=0.12 SENSITIVE Sensitive     CEFTRIAXONE <=0.25 SENSITIVE Sensitive     CIPROFLOXACIN <=0.25 SENSITIVE Sensitive     GENTAMICIN <=1 SENSITIVE Sensitive     IMIPENEM 2 SENSITIVE Sensitive     NITROFURANTOIN 128 RESISTANT Resistant     TRIMETH/SULFA <=20 SENSITIVE Sensitive     AMPICILLIN/SULBACTAM <=2 SENSITIVE Sensitive     PIP/TAZO <=4 SENSITIVE Sensitive     * >=100,000 COLONIES/mL PROTEUS MIRABILIS  Resp Panel by RT-PCR (Flu A&B, Covid) Urine, Catheterized     Status: None   Collection Time: 07/19/21  5:04 PM   Specimen: Urine, Catheterized; Nasal Swab  Result Value Ref Range Status   SARS Coronavirus 2 by RT PCR NEGATIVE NEGATIVE Final    Comment: (NOTE) SARS-CoV-2 target nucleic acids are NOT DETECTED.  The SARS-CoV-2 RNA is generally detectable in  upper respiratory specimens during the acute phase of infection. The lowest concentration of SARS-CoV-2 viral copies this assay can detect is 138  copies/mL. A negative result does not preclude SARS-Cov-2 infection and should not be used as the sole basis for treatment or other patient management decisions. A negative result may occur with  improper specimen collection/handling, submission of specimen other than nasopharyngeal swab, presence of viral mutation(s) within the areas targeted by this assay, and inadequate number of viral copies(<138 copies/mL). A negative result must be combined with clinical observations, patient history, and epidemiological information. The expected result is Negative.  Fact Sheet for Patients:  EntrepreneurPulse.com.au  Fact Sheet for Healthcare Providers:  IncredibleEmployment.be  This test is no t yet approved or cleared by the [place] FDA and  has been authorized for detection and/or diagnosis of SARS-CoV-2 by FDA under an Emergency Use Authorization (EUA). This EUA will remain  in effect (meaning this test can be used) for the duration of the COVID-19 declaration under Section 564(b)(1) of the Act, 21 U.S.C.section 360bbb-3(b)(1), unless the authorization is terminated  or revoked sooner.       Influenza A by PCR NEGATIVE NEGATIVE Final   Influenza B by PCR NEGATIVE NEGATIVE Final    Comment: (NOTE) The Xpert Xpress SARS-CoV-2/FLU/RSV plus assay is intended as an aid in the diagnosis of influenza from Nasopharyngeal swab specimens and should not be used as a sole basis for treatment. Nasal washings and aspirates are unacceptable for Xpert Xpress SARS-CoV-2/FLU/RSV testing.  Fact Sheet for Patients: EntrepreneurPulse.com.au  Fact Sheet for Healthcare Providers: IncredibleEmployment.be  This test is not yet approved or cleared by the [place] FDA and has been authorized for detection and/or diagnosis of SARS-CoV-2 by FDA under an Emergency Use Authorization (EUA). This EUA will remain in effect (meaning  this test can be used) for the duration of the COVID-19 declaration under Section 564(b)(1) of the Act, 21 U.S.C. section 360bbb-3(b)(1), unless the authorization is terminated or revoked.  Performed at [facility], [place] [address], [place], [place] [address]   Urine Culture     Status: Abnormal   Collection Time: 07/19/21  5:04 PM   Specimen: In/Out Cath Urine  Result Value Ref Range Status   Specimen Description   Final    IN/OUT CATH URINE Performed at [place] [address] [place], [place] [address]    Special Requests   Final    NONE Performed at [facility], [place] [address], [place], [place] [address]    Culture >=100,000 COLONIES/mL PROTEUS MIRABILIS (A)  Final   Report Status 07/21/2021 FINAL  Final   Organism ID, Bacteria PROTEUS MIRABILIS (A)  Final      Susceptibility   Proteus mirabilis - MIC*    AMPICILLIN <=2 SENSITIVE Sensitive     CEFAZOLIN 8 SENSITIVE Sensitive     CEFEPIME <=0.12 SENSITIVE Sensitive     CEFTRIAXONE <=0.25 SENSITIVE Sensitive     CIPROFLOXACIN <=0.25 SENSITIVE Sensitive     GENTAMICIN <=1 SENSITIVE Sensitive     IMIPENEM 2 SENSITIVE Sensitive     NITROFURANTOIN 128 RESISTANT Resistant     TRIMETH/SULFA <=20 SENSITIVE Sensitive     AMPICILLIN/SULBACTAM <=2 SENSITIVE Sensitive     PIP/TAZO <=4 SENSITIVE Sensitive     * >=100,000 COLONIES/mL PROTEUS MIRABILIS  Culture, blood (Routine x 2)     Status: Abnormal   Collection Time: 07/19/21  5:42 PM   Specimen: BLOOD  Result Value Ref  Range Status   Specimen Description   Final    BLOOD LEFT ANTECUBITAL Performed at [place] [address], [place], [place] [address]    Special Requests   Final    BOTTLES DRAWN AEROBIC AND ANAEROBIC Blood Culture results may not be optimal due to an excessive volume of blood received in culture bottles Performed at [place] [address],  [place], [place] [address]    Culture  Setup Time   Final    GRAM NEGATIVE RODS IN BOTH AEROBIC AND ANAEROBIC BOTTLES CRITICAL RESULT CALLED TO, READ BACK BY AND VERIFIED WITH: PHARMD J LEGGE [id number] AT 833 AM BY CM Performed at [facility], [place] [address], [place], [place] [address]    Culture PROTEUS MIRABILIS (A)  Final   Report Status 07/22/2021 FINAL  Final   Organism ID, Bacteria PROTEUS MIRABILIS  Final      Susceptibility   Proteus mirabilis - MIC*    AMPICILLIN <=2 SENSITIVE Sensitive     CEFAZOLIN <=4 SENSITIVE Sensitive     CEFEPIME <=0.12 SENSITIVE Sensitive     CEFTAZIDIME <=1 SENSITIVE Sensitive     CEFTRIAXONE <=0.25 SENSITIVE Sensitive     CIPROFLOXACIN <=0.25 SENSITIVE Sensitive     GENTAMICIN <=1 SENSITIVE Sensitive     IMIPENEM 2 SENSITIVE Sensitive     TRIMETH/SULFA <=20 SENSITIVE Sensitive     AMPICILLIN/SULBACTAM <=2 SENSITIVE Sensitive     PIP/TAZO <=4 SENSITIVE Sensitive     * PROTEUS MIRABILIS  Culture, blood (Routine x 2)     Status: Abnormal   Collection Time: 07/19/21  5:42 PM   Specimen: BLOOD  Result Value Ref Range Status   Specimen Description   Final    BLOOD RIGHT ANTECUBITAL Performed at [place] [address], [place], [place] [address]    Special Requests   Final    BOTTLES DRAWN AEROBIC AND ANAEROBIC Blood Culture results may not be optimal due to an excessive volume of blood received in culture bottles Performed at [place] [address] [place], [place] [address]    Culture  Setup Time   Final    GRAM NEGATIVE RODS IN BOTH AEROBIC AND ANAEROBIC BOTTLES CRITICAL VALUE NOTED.  VALUE IS CONSISTENT WITH PREVIOUSLY REPORTED AND CALLED VALUE.    Culture (A)  Final    PROTEUS MIRABILIS SUSCEPTIBILITIES PERFORMED ON PREVIOUS CULTURE WITHIN THE LAST 5 DAYS. Performed at [facility], [place] [address] [place],  [address]    Report Status 07/22/2021 FINAL  Final  Blood Culture  ID Panel (Reflexed)     Status: Abnormal   Collection Time: 07/19/21  5:42 PM  Result Value Ref Range Status   Enterococcus faecalis NOT DETECTED NOT DETECTED Final   Enterococcus Faecium NOT DETECTED NOT DETECTED Final   Listeria monocytogenes NOT DETECTED NOT DETECTED Final   Staphylococcus species NOT DETECTED NOT DETECTED Final   Staphylococcus aureus (BCID) NOT DETECTED NOT DETECTED Final   Staphylococcus epidermidis NOT DETECTED NOT DETECTED Final   Staphylococcus lugdunensis NOT DETECTED NOT DETECTED Final   Streptococcus species NOT DETECTED NOT DETECTED Final   Streptococcus agalactiae NOT DETECTED NOT DETECTED Final   Streptococcus pneumoniae NOT DETECTED NOT DETECTED Final   Streptococcus pyogenes NOT DETECTED NOT DETECTED Final   A.calcoaceticus-baumannii NOT DETECTED NOT DETECTED Final   Bacteroides fragilis NOT DETECTED NOT DETECTED Final   Enterobacterales DETECTED (A) NOT DETECTED Final    Comment:  Enterobacterales represent a large order of gram negative bacteria, not a single organism. CRITICAL RESULT CALLED TO, READ BACK BY AND VERIFIED WITH: PHARMD J LEGGE [id number] AT 80 AM BY CM    Enterobacter cloacae complex NOT DETECTED NOT DETECTED Final   Escherichia coli NOT DETECTED NOT DETECTED Final   Klebsiella aerogenes NOT DETECTED NOT DETECTED Final   Klebsiella oxytoca NOT DETECTED NOT DETECTED Final   Klebsiella pneumoniae NOT DETECTED NOT DETECTED Final   Proteus species DETECTED (A) NOT DETECTED Final    Comment: CRITICAL RESULT CALLED TO, READ BACK BY AND VERIFIED WITH: PHARMD J LEGGE [id number] AT 833 AM BY CM    Salmonella species NOT DETECTED NOT DETECTED Final   Serratia marcescens NOT DETECTED NOT DETECTED Final   Haemophilus influenzae NOT DETECTED NOT DETECTED Final   Neisseria meningitidis NOT DETECTED NOT DETECTED Final   Pseudomonas aeruginosa NOT DETECTED NOT DETECTED Final   Stenotrophomonas maltophilia NOT DETECTED NOT DETECTED Final   Candida  albicans NOT DETECTED NOT DETECTED Final   Candida auris NOT DETECTED NOT DETECTED Final   Candida glabrata NOT DETECTED NOT DETECTED Final   Candida krusei NOT DETECTED NOT DETECTED Final   Candida parapsilosis NOT DETECTED NOT DETECTED Final   Candida tropicalis NOT DETECTED NOT DETECTED Final   Cryptococcus neoformans/gattii NOT DETECTED NOT DETECTED Final   CTX-M ESBL NOT DETECTED NOT DETECTED Final   Carbapenem resistance IMP NOT DETECTED NOT DETECTED Final   Carbapenem resistance KPC NOT DETECTED NOT DETECTED Final   Carbapenem resistance NDM NOT DETECTED NOT DETECTED Final   Carbapenem resist OXA 48 LIKE NOT DETECTED NOT DETECTED Final   Carbapenem resistance VIM NOT DETECTED NOT DETECTED Final    Comment: Performed at [facility], [address] [place], [place] [address]  MRSA Next Gen by PCR, Nasal     Status: Abnormal   Collection Time: 07/19/21 10:34 PM   Specimen: Nasal Mucosa; Nasal Swab  Result Value Ref Range Status   MRSA by PCR Next Gen DETECTED (A) NOT DETECTED Final    Comment: RESULT CALLED TO, READ BACK BY AND VERIFIED WITH: B,CLAPP AT 0122 ON 07/20/21 BY A,MOHAMED (NOTE) The GeneXpert MRSA Assay (FDA approved for NASAL specimens only), is one component of a comprehensive MRSA colonization surveillance program. It is not intended to diagnose MRSA infection nor to guide or monitor treatment for MRSA infections. Test performance is not FDA approved in patients less than 39 years old. Performed at [facility], [place] [address], [place],  [address]          Radiology Studies: DG CHEST PORT 1 VIEW  Result Date: 07/22/2021 CLINICAL DATA:  [id number] follow-up aspiration into airway EXAM: PORTABLE CHEST 1 VIEW COMPARISON:  July 19, 2021 FINDINGS: The heart size and mediastinal contours are within normal limits. Right lung is clear. At the left side, there is an apparent mild opacity seen at the infrahilar region extending into the  lower lobe. Remainder of the lung fields are clear. The visualized skeletal structures are unremarkable. IMPRESSION: Apparent opacity at the left infrahilar region extending into the lower lobe and is suspicious for infiltrations. Electronically Signed   By: Frazier Richards M.D.   On: 07/22/2021 13:31        Scheduled Meds:  Chlorhexidine Gluconate Cloth  6 each Topical Daily   Gerhardt's butt cream   Topical BID   mouth rinse  15 mL Mouth Rinse 4 times per day   scopolamine  1 patch Transdermal Q72H   Continuous Infusions:  sodium chloride Stopped (07/22/21 2126)     LOS: 4 days    Time spent: 35 minutes.     Elmarie Shiley, MD [facility]   If 7PM-7AM, please contact night-coverage [email or web address]  07/23/2021, 6:47 AM

## 2021-07-26 ENCOUNTER — Telehealth: Payer: Self-pay

## 2021-07-26 ENCOUNTER — Encounter: Payer: Medicare Other | Admitting: Infectious Diseases

## 2021-07-27 ENCOUNTER — Telehealth: Payer: Self-pay | Admitting: *Deleted

## 2021-07-27 NOTE — Telephone Encounter (Signed)
Call from patient's sister would like to speak with [name] about patient.

## 2021-08-09 NOTE — Telephone Encounter (Addendum)
Received fax from [facility] to notify office that patient passed away. Will update provider and [facility].  Leatrice Jewels, RMA

## 2021-08-09 DEATH — deceased
